# Patient Record
Sex: Female | Born: 1950 | Race: Black or African American | Hispanic: No | Marital: Married | Smoking: Never smoker
Health system: Southern US, Community
[De-identification: ages and names within clinical notes are randomized; demographics above are authoritative.]

## PROBLEM LIST (undated history)

## (undated) DIAGNOSIS — Z531 Procedure and treatment not carried out because of patient's decision for reasons of belief and group pressure: Secondary | ICD-10-CM

## (undated) DIAGNOSIS — R112 Nausea with vomiting, unspecified: Secondary | ICD-10-CM

## (undated) DIAGNOSIS — F329 Major depressive disorder, single episode, unspecified: Secondary | ICD-10-CM

## (undated) DIAGNOSIS — M3500 Sicca syndrome, unspecified: Secondary | ICD-10-CM

## (undated) DIAGNOSIS — J189 Pneumonia, unspecified organism: Secondary | ICD-10-CM

## (undated) DIAGNOSIS — I099 Rheumatic heart disease, unspecified: Secondary | ICD-10-CM

## (undated) DIAGNOSIS — S82852A Displaced trimalleolar fracture of left lower leg, initial encounter for closed fracture: Secondary | ICD-10-CM

## (undated) DIAGNOSIS — H409 Unspecified glaucoma: Secondary | ICD-10-CM

## (undated) DIAGNOSIS — N183 Chronic kidney disease, stage 3 unspecified: Secondary | ICD-10-CM

## (undated) DIAGNOSIS — M1990 Unspecified osteoarthritis, unspecified site: Secondary | ICD-10-CM

## (undated) DIAGNOSIS — Z8679 Personal history of other diseases of the circulatory system: Secondary | ICD-10-CM

## (undated) DIAGNOSIS — R911 Solitary pulmonary nodule: Secondary | ICD-10-CM

## (undated) DIAGNOSIS — K219 Gastro-esophageal reflux disease without esophagitis: Secondary | ICD-10-CM

## (undated) DIAGNOSIS — G4700 Insomnia, unspecified: Secondary | ICD-10-CM

## (undated) DIAGNOSIS — E785 Hyperlipidemia, unspecified: Secondary | ICD-10-CM

## (undated) DIAGNOSIS — IMO0001 Reserved for inherently not codable concepts without codable children: Secondary | ICD-10-CM

## (undated) DIAGNOSIS — Z86718 Personal history of other venous thrombosis and embolism: Secondary | ICD-10-CM

## (undated) DIAGNOSIS — M8580 Other specified disorders of bone density and structure, unspecified site: Secondary | ICD-10-CM

## (undated) DIAGNOSIS — Z86711 Personal history of pulmonary embolism: Secondary | ICD-10-CM

## (undated) DIAGNOSIS — Z87442 Personal history of urinary calculi: Secondary | ICD-10-CM

## (undated) DIAGNOSIS — I509 Heart failure, unspecified: Secondary | ICD-10-CM

## (undated) DIAGNOSIS — I10 Essential (primary) hypertension: Secondary | ICD-10-CM

## (undated) DIAGNOSIS — M5030 Other cervical disc degeneration, unspecified cervical region: Secondary | ICD-10-CM

## (undated) DIAGNOSIS — Z9889 Other specified postprocedural states: Secondary | ICD-10-CM

## (undated) DIAGNOSIS — S82892A Other fracture of left lower leg, initial encounter for closed fracture: Secondary | ICD-10-CM

## (undated) DIAGNOSIS — F32A Depression, unspecified: Secondary | ICD-10-CM

## (undated) DIAGNOSIS — H269 Unspecified cataract: Secondary | ICD-10-CM

## (undated) DIAGNOSIS — T7840XA Allergy, unspecified, initial encounter: Secondary | ICD-10-CM

## (undated) DIAGNOSIS — M797 Fibromyalgia: Secondary | ICD-10-CM

## (undated) DIAGNOSIS — F419 Anxiety disorder, unspecified: Secondary | ICD-10-CM

## (undated) HISTORY — DX: Sjogren syndrome, unspecified: M35.00

## (undated) HISTORY — DX: Rheumatic heart disease, unspecified: I09.9

## (undated) HISTORY — DX: Chronic kidney disease, stage 3 (moderate): N18.3

## (undated) HISTORY — DX: Chronic kidney disease, stage 3 unspecified: N18.30

## (undated) HISTORY — DX: Heart failure, unspecified: I50.9

## (undated) HISTORY — PX: FRACTURE SURGERY: SHX138

## (undated) HISTORY — DX: Personal history of pulmonary embolism: Z86.711

## (undated) HISTORY — DX: Major depressive disorder, single episode, unspecified: F32.9

## (undated) HISTORY — DX: Other cervical disc degeneration, unspecified cervical region: M50.30

## (undated) HISTORY — DX: Insomnia, unspecified: G47.00

## (undated) HISTORY — DX: Unspecified cataract: H26.9

## (undated) HISTORY — DX: Allergy, unspecified, initial encounter: T78.40XA

## (undated) HISTORY — DX: Pneumonia, unspecified organism: J18.9

## (undated) HISTORY — PX: HERNIA REPAIR: SHX51

## (undated) HISTORY — DX: Fibromyalgia: M79.7

## (undated) HISTORY — DX: Hyperlipidemia, unspecified: E78.5

## (undated) HISTORY — DX: Essential (primary) hypertension: I10

## (undated) HISTORY — DX: Personal history of urinary calculi: Z87.442

## (undated) HISTORY — DX: Gastro-esophageal reflux disease without esophagitis: K21.9

## (undated) HISTORY — DX: Anxiety disorder, unspecified: F41.9

## (undated) HISTORY — DX: Unspecified osteoarthritis, unspecified site: M19.90

## (undated) HISTORY — DX: Personal history of other diseases of the circulatory system: Z86.79

## (undated) HISTORY — DX: Other specified disorders of bone density and structure, unspecified site: M85.80

## (undated) HISTORY — DX: Unspecified glaucoma: H40.9

## (undated) HISTORY — PX: EYE SURGERY: SHX253

## (undated) HISTORY — DX: Personal history of other venous thrombosis and embolism: Z86.718

## (undated) HISTORY — DX: Solitary pulmonary nodule: R91.1

## (undated) HISTORY — DX: Depression, unspecified: F32.A

---
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
## 1978-03-18 DIAGNOSIS — Z9889 Other specified postprocedural states: Secondary | ICD-10-CM | POA: Insufficient documentation

## 1978-03-18 DIAGNOSIS — I099 Rheumatic heart disease, unspecified: Secondary | ICD-10-CM

## 1978-03-18 HISTORY — PX: TUBAL LIGATION: SHX77

## 1978-03-18 HISTORY — DX: Rheumatic heart disease, unspecified: I09.9

## 1978-03-18 HISTORY — PX: MITRAL VALVE REPAIR: SHX2039

## 1990-03-18 HISTORY — PX: VAGINAL HYSTERECTOMY: SUR661

## 1999-10-02 ENCOUNTER — Encounter: Payer: Self-pay | Admitting: Cardiology

## 2000-05-09 ENCOUNTER — Encounter: Payer: Self-pay | Admitting: Cardiology

## 2000-11-13 ENCOUNTER — Encounter: Payer: Self-pay | Admitting: Cardiology

## 2001-02-02 ENCOUNTER — Other Ambulatory Visit: Admission: RE | Admit: 2001-02-02 | Discharge: 2001-02-02 | Payer: Self-pay | Admitting: Obstetrics and Gynecology

## 2001-10-28 ENCOUNTER — Encounter: Payer: Self-pay | Admitting: Cardiology

## 2001-12-01 ENCOUNTER — Encounter: Payer: Self-pay | Admitting: Cardiology

## 2001-12-11 ENCOUNTER — Encounter: Payer: Self-pay | Admitting: Cardiology

## 2002-11-23 ENCOUNTER — Encounter: Payer: Self-pay | Admitting: Cardiology

## 2003-11-22 ENCOUNTER — Encounter: Admission: RE | Admit: 2003-11-22 | Discharge: 2003-11-22 | Payer: Self-pay | Admitting: Internal Medicine

## 2003-12-06 ENCOUNTER — Encounter: Payer: Self-pay | Admitting: Cardiology

## 2003-12-28 ENCOUNTER — Encounter: Payer: Self-pay | Admitting: Cardiology

## 2004-03-18 HISTORY — PX: BREAST BIOPSY: SHX20

## 2004-10-24 ENCOUNTER — Ambulatory Visit: Payer: Self-pay | Admitting: Internal Medicine

## 2004-10-26 ENCOUNTER — Encounter (INDEPENDENT_AMBULATORY_CARE_PROVIDER_SITE_OTHER): Payer: Self-pay | Admitting: Pulmonary Disease

## 2004-10-30 ENCOUNTER — Ambulatory Visit: Payer: Self-pay | Admitting: Internal Medicine

## 2004-11-01 ENCOUNTER — Ambulatory Visit: Payer: Self-pay | Admitting: Internal Medicine

## 2004-11-14 ENCOUNTER — Ambulatory Visit: Payer: Self-pay | Admitting: Internal Medicine

## 2004-12-04 ENCOUNTER — Ambulatory Visit: Payer: Self-pay | Admitting: Internal Medicine

## 2004-12-05 ENCOUNTER — Ambulatory Visit: Payer: Self-pay | Admitting: Cardiology

## 2004-12-05 ENCOUNTER — Encounter: Payer: Self-pay | Admitting: Cardiology

## 2004-12-05 ENCOUNTER — Ambulatory Visit (HOSPITAL_COMMUNITY): Admission: RE | Admit: 2004-12-05 | Discharge: 2004-12-05 | Payer: Self-pay | Admitting: Internal Medicine

## 2004-12-11 ENCOUNTER — Ambulatory Visit: Payer: Self-pay | Admitting: Internal Medicine

## 2004-12-18 ENCOUNTER — Ambulatory Visit: Payer: Self-pay | Admitting: Hospitalist

## 2004-12-25 ENCOUNTER — Ambulatory Visit: Payer: Self-pay | Admitting: Hospitalist

## 2005-01-08 ENCOUNTER — Ambulatory Visit: Payer: Self-pay | Admitting: Internal Medicine

## 2005-02-21 ENCOUNTER — Ambulatory Visit: Payer: Self-pay | Admitting: Internal Medicine

## 2005-07-02 ENCOUNTER — Encounter: Admission: RE | Admit: 2005-07-02 | Discharge: 2005-07-02 | Payer: Self-pay | Admitting: Internal Medicine

## 2005-07-02 ENCOUNTER — Encounter (INDEPENDENT_AMBULATORY_CARE_PROVIDER_SITE_OTHER): Payer: Self-pay | Admitting: Pulmonary Disease

## 2005-07-03 ENCOUNTER — Encounter: Admission: RE | Admit: 2005-07-03 | Discharge: 2005-07-03 | Payer: Self-pay | Admitting: Internal Medicine

## 2005-07-03 ENCOUNTER — Encounter (INDEPENDENT_AMBULATORY_CARE_PROVIDER_SITE_OTHER): Payer: Self-pay | Admitting: Pulmonary Disease

## 2005-07-03 ENCOUNTER — Encounter (INDEPENDENT_AMBULATORY_CARE_PROVIDER_SITE_OTHER): Payer: Self-pay | Admitting: Specialist

## 2005-08-26 ENCOUNTER — Ambulatory Visit: Payer: Self-pay | Admitting: Internal Medicine

## 2005-10-03 ENCOUNTER — Ambulatory Visit: Payer: Self-pay | Admitting: Internal Medicine

## 2005-12-31 DIAGNOSIS — I10 Essential (primary) hypertension: Secondary | ICD-10-CM | POA: Insufficient documentation

## 2005-12-31 DIAGNOSIS — E7800 Pure hypercholesterolemia, unspecified: Secondary | ICD-10-CM

## 2005-12-31 DIAGNOSIS — F5104 Psychophysiologic insomnia: Secondary | ICD-10-CM | POA: Insufficient documentation

## 2005-12-31 DIAGNOSIS — I099 Rheumatic heart disease, unspecified: Secondary | ICD-10-CM | POA: Insufficient documentation

## 2005-12-31 DIAGNOSIS — F339 Major depressive disorder, recurrent, unspecified: Secondary | ICD-10-CM | POA: Insufficient documentation

## 2006-01-07 ENCOUNTER — Ambulatory Visit: Payer: Self-pay | Admitting: Internal Medicine

## 2006-01-28 ENCOUNTER — Ambulatory Visit: Payer: Self-pay | Admitting: Internal Medicine

## 2006-02-11 ENCOUNTER — Encounter (INDEPENDENT_AMBULATORY_CARE_PROVIDER_SITE_OTHER): Payer: Self-pay | Admitting: Cardiology

## 2006-02-11 ENCOUNTER — Encounter: Payer: Self-pay | Admitting: Cardiology

## 2006-02-11 ENCOUNTER — Ambulatory Visit (HOSPITAL_COMMUNITY): Admission: RE | Admit: 2006-02-11 | Discharge: 2006-02-11 | Payer: Self-pay | Admitting: Internal Medicine

## 2006-03-28 DIAGNOSIS — Z9079 Acquired absence of other genital organ(s): Secondary | ICD-10-CM

## 2006-03-28 DIAGNOSIS — F411 Generalized anxiety disorder: Secondary | ICD-10-CM | POA: Insufficient documentation

## 2006-03-28 DIAGNOSIS — Z90710 Acquired absence of both cervix and uterus: Secondary | ICD-10-CM | POA: Insufficient documentation

## 2006-03-28 DIAGNOSIS — Z9889 Other specified postprocedural states: Secondary | ICD-10-CM

## 2006-08-14 ENCOUNTER — Emergency Department (HOSPITAL_COMMUNITY): Admission: EM | Admit: 2006-08-14 | Discharge: 2006-08-14 | Payer: Self-pay | Admitting: Emergency Medicine

## 2006-08-25 ENCOUNTER — Ambulatory Visit: Payer: Self-pay | Admitting: Internal Medicine

## 2006-08-25 ENCOUNTER — Encounter (INDEPENDENT_AMBULATORY_CARE_PROVIDER_SITE_OTHER): Payer: Self-pay | Admitting: Dermatology

## 2006-08-25 DIAGNOSIS — M549 Dorsalgia, unspecified: Secondary | ICD-10-CM | POA: Insufficient documentation

## 2006-08-27 ENCOUNTER — Encounter (INDEPENDENT_AMBULATORY_CARE_PROVIDER_SITE_OTHER): Payer: Self-pay | Admitting: Dermatology

## 2006-08-27 LAB — CONVERTED CEMR LAB
AST: 24 units/L (ref 0–37)
Albumin: 4.6 g/dL (ref 3.5–5.2)
Alkaline Phosphatase: 58 units/L (ref 39–117)
BUN: 12 mg/dL (ref 6–23)
Creatinine, Ser: 0.88 mg/dL (ref 0.40–1.20)
HDL: 70 mg/dL (ref >39)
Hepatitis B Surface Ag: NEGATIVE
LDL Cholesterol: 129 mg/dL — ABNORMAL HIGH (ref 0–99)
Potassium: 4.1 meq/L (ref 3.5–5.3)
Total Bilirubin: 0.3 mg/dL (ref 0.3–1.2)
Total CHOL/HDL Ratio: 3.1
Triglycerides: 96 mg/dL (ref <150)
VLDL: 19 mg/dL (ref 0–40)

## 2006-09-08 ENCOUNTER — Ambulatory Visit: Payer: Self-pay | Admitting: Internal Medicine

## 2006-09-08 ENCOUNTER — Encounter (INDEPENDENT_AMBULATORY_CARE_PROVIDER_SITE_OTHER): Payer: Self-pay | Admitting: Dermatology

## 2006-11-26 ENCOUNTER — Ambulatory Visit (HOSPITAL_COMMUNITY): Admission: RE | Admit: 2006-11-26 | Discharge: 2006-11-26 | Payer: Self-pay | Admitting: Internal Medicine

## 2006-11-26 ENCOUNTER — Ambulatory Visit: Payer: Self-pay | Admitting: Internal Medicine

## 2006-11-26 ENCOUNTER — Encounter (INDEPENDENT_AMBULATORY_CARE_PROVIDER_SITE_OTHER): Payer: Self-pay | Admitting: Internal Medicine

## 2006-12-09 ENCOUNTER — Encounter (INDEPENDENT_AMBULATORY_CARE_PROVIDER_SITE_OTHER): Payer: Self-pay | Admitting: Internal Medicine

## 2006-12-09 ENCOUNTER — Ambulatory Visit (HOSPITAL_COMMUNITY): Admission: RE | Admit: 2006-12-09 | Discharge: 2006-12-09 | Payer: Self-pay | Admitting: Internal Medicine

## 2006-12-09 ENCOUNTER — Encounter: Payer: Self-pay | Admitting: Cardiology

## 2006-12-09 ENCOUNTER — Ambulatory Visit: Payer: Self-pay | Admitting: Internal Medicine

## 2006-12-09 LAB — CONVERTED CEMR LAB
Calcium: 9.5 mg/dL (ref 8.4–10.5)
Cholesterol: 222 mg/dL — ABNORMAL HIGH (ref 0–200)
HDL: 74 mg/dL (ref >39)
Potassium: 4 meq/L (ref 3.5–5.3)
Total CHOL/HDL Ratio: 3
Triglycerides: 83 mg/dL (ref <150)

## 2006-12-25 ENCOUNTER — Ambulatory Visit: Payer: Self-pay | Admitting: *Deleted

## 2006-12-25 ENCOUNTER — Encounter (INDEPENDENT_AMBULATORY_CARE_PROVIDER_SITE_OTHER): Payer: Self-pay | Admitting: Infectious Diseases

## 2006-12-25 LAB — CONVERTED CEMR LAB
BUN: 10 mg/dL (ref 6–23)
Calcium: 9.4 mg/dL (ref 8.4–10.5)
Glucose, Bld: 122 mg/dL — ABNORMAL HIGH (ref 70–99)
Potassium: 4 meq/L (ref 3.5–5.3)

## 2006-12-29 ENCOUNTER — Telehealth: Payer: Self-pay | Admitting: *Deleted

## 2007-07-01 ENCOUNTER — Ambulatory Visit: Payer: Self-pay | Admitting: Internal Medicine

## 2007-07-01 ENCOUNTER — Ambulatory Visit (HOSPITAL_COMMUNITY): Admission: RE | Admit: 2007-07-01 | Discharge: 2007-07-01 | Payer: Self-pay | Admitting: Internal Medicine

## 2007-07-01 ENCOUNTER — Encounter (INDEPENDENT_AMBULATORY_CARE_PROVIDER_SITE_OTHER): Payer: Self-pay | Admitting: Internal Medicine

## 2007-07-01 DIAGNOSIS — M2550 Pain in unspecified joint: Secondary | ICD-10-CM | POA: Insufficient documentation

## 2007-07-08 ENCOUNTER — Encounter: Payer: Self-pay | Admitting: *Deleted

## 2007-07-27 LAB — CONVERTED CEMR LAB
ANA Titer 1: 1:40 {titer} — ABNORMAL HIGH
Alkaline Phosphatase: 81 units/L (ref 39–117)
Basophils Relative: 0 % (ref 0–1)
Bilirubin, Direct: <0.1 mg/dL (ref 0.0–0.3)
CO2: 20 meq/L (ref 19–32)
Calcium: 9.8 mg/dL (ref 8.4–10.5)
Eosinophils Absolute: 0.1 10*3/uL (ref 0.0–0.7)
Lymphs Abs: 2.1 10*3/uL (ref 0.7–4.0)
MCV: 82.6 fL (ref 78.0–100.0)
Neutrophils Relative %: 52 % (ref 43–77)
Platelets: 359 10*3/uL (ref 150–400)
Sed Rate: 30 mm/hr — ABNORMAL HIGH (ref 0–22)
Sodium: 141 meq/L (ref 135–145)
Total Bilirubin: 0.3 mg/dL (ref 0.3–1.2)
WBC: 5.4 10*3/uL (ref 4.0–10.5)

## 2007-07-28 ENCOUNTER — Ambulatory Visit: Payer: Self-pay | Admitting: Internal Medicine

## 2007-07-28 ENCOUNTER — Encounter: Payer: Self-pay | Admitting: Internal Medicine

## 2007-07-28 LAB — CONVERTED CEMR LAB
CRP: 1.1 mg/dL — ABNORMAL HIGH (ref <0.6)
Total CK: 151 units/L (ref 7–177)
ds DNA Ab: <1 (ref <5)

## 2007-09-01 ENCOUNTER — Ambulatory Visit: Payer: Self-pay | Admitting: Internal Medicine

## 2007-10-23 ENCOUNTER — Encounter (INDEPENDENT_AMBULATORY_CARE_PROVIDER_SITE_OTHER): Payer: Self-pay | Admitting: Internal Medicine

## 2007-11-14 ENCOUNTER — Emergency Department (HOSPITAL_COMMUNITY): Admission: EM | Admit: 2007-11-14 | Discharge: 2007-11-14 | Payer: Self-pay | Admitting: Emergency Medicine

## 2007-12-04 ENCOUNTER — Ambulatory Visit: Payer: Self-pay | Admitting: Internal Medicine

## 2007-12-04 ENCOUNTER — Encounter (INDEPENDENT_AMBULATORY_CARE_PROVIDER_SITE_OTHER): Payer: Self-pay | Admitting: Internal Medicine

## 2007-12-04 LAB — CONVERTED CEMR LAB
BUN: 10 mg/dL (ref 6–23)
Creatinine, Ser: 1.1 mg/dL (ref 0.40–1.20)

## 2008-02-07 ENCOUNTER — Emergency Department (HOSPITAL_COMMUNITY): Admission: EM | Admit: 2008-02-07 | Discharge: 2008-02-07 | Payer: Self-pay | Admitting: Emergency Medicine

## 2008-02-16 ENCOUNTER — Encounter (INDEPENDENT_AMBULATORY_CARE_PROVIDER_SITE_OTHER): Payer: Self-pay | Admitting: Internal Medicine

## 2008-02-16 ENCOUNTER — Ambulatory Visit: Payer: Self-pay | Admitting: Internal Medicine

## 2008-02-16 LAB — CONVERTED CEMR LAB
Calcium: 10 mg/dL (ref 8.4–10.5)
Potassium: 4.3 meq/L (ref 3.5–5.3)
Sodium: 140 meq/L (ref 135–145)

## 2008-03-01 ENCOUNTER — Ambulatory Visit: Payer: Self-pay | Admitting: Internal Medicine

## 2008-03-18 DIAGNOSIS — Z87442 Personal history of urinary calculi: Secondary | ICD-10-CM

## 2008-03-18 HISTORY — DX: Personal history of urinary calculi: Z87.442

## 2008-03-23 ENCOUNTER — Encounter (INDEPENDENT_AMBULATORY_CARE_PROVIDER_SITE_OTHER): Payer: Self-pay | Admitting: Internal Medicine

## 2008-03-23 ENCOUNTER — Ambulatory Visit: Payer: Self-pay | Admitting: Internal Medicine

## 2008-03-23 LAB — CONVERTED CEMR LAB
CO2: 21 meq/L (ref 19–32)
Cholesterol: 267 mg/dL — ABNORMAL HIGH (ref 0–200)
Glucose, Bld: 93 mg/dL (ref 70–99)
HDL: 65 mg/dL (ref >39)
Potassium: 3.7 meq/L (ref 3.5–5.3)
Sodium: 141 meq/L (ref 135–145)
Total CHOL/HDL Ratio: 4.1
VLDL: 25 mg/dL (ref 0–40)

## 2008-03-31 ENCOUNTER — Telehealth (INDEPENDENT_AMBULATORY_CARE_PROVIDER_SITE_OTHER): Payer: Self-pay | Admitting: Internal Medicine

## 2008-04-06 ENCOUNTER — Encounter (INDEPENDENT_AMBULATORY_CARE_PROVIDER_SITE_OTHER): Payer: Self-pay | Admitting: Internal Medicine

## 2008-04-07 ENCOUNTER — Ambulatory Visit: Payer: Self-pay | Admitting: Internal Medicine

## 2008-04-07 ENCOUNTER — Encounter (INDEPENDENT_AMBULATORY_CARE_PROVIDER_SITE_OTHER): Payer: Self-pay | Admitting: Internal Medicine

## 2008-04-15 ENCOUNTER — Ambulatory Visit (HOSPITAL_COMMUNITY): Admission: RE | Admit: 2008-04-15 | Discharge: 2008-04-15 | Payer: Self-pay | Admitting: Internal Medicine

## 2008-04-27 ENCOUNTER — Ambulatory Visit: Payer: Self-pay | Admitting: Internal Medicine

## 2008-04-27 LAB — CONVERTED CEMR LAB
OCCULT 1: NEGATIVE
OCCULT 2: NEGATIVE

## 2008-06-28 ENCOUNTER — Ambulatory Visit: Payer: Self-pay | Admitting: Internal Medicine

## 2008-06-28 DIAGNOSIS — N951 Menopausal and female climacteric states: Secondary | ICD-10-CM

## 2008-06-28 DIAGNOSIS — M7061 Trochanteric bursitis, right hip: Secondary | ICD-10-CM

## 2008-08-03 ENCOUNTER — Emergency Department (HOSPITAL_COMMUNITY): Admission: EM | Admit: 2008-08-03 | Discharge: 2008-08-03 | Payer: Self-pay | Admitting: Emergency Medicine

## 2008-08-29 ENCOUNTER — Encounter (INDEPENDENT_AMBULATORY_CARE_PROVIDER_SITE_OTHER): Payer: Self-pay | Admitting: Internal Medicine

## 2008-09-05 ENCOUNTER — Encounter (INDEPENDENT_AMBULATORY_CARE_PROVIDER_SITE_OTHER): Payer: Self-pay | Admitting: Internal Medicine

## 2009-05-15 ENCOUNTER — Ambulatory Visit (HOSPITAL_COMMUNITY): Admission: RE | Admit: 2009-05-15 | Discharge: 2009-05-15 | Payer: Self-pay | Admitting: Internal Medicine

## 2009-07-19 ENCOUNTER — Encounter (INDEPENDENT_AMBULATORY_CARE_PROVIDER_SITE_OTHER): Payer: Self-pay | Admitting: Internal Medicine

## 2010-04-04 ENCOUNTER — Emergency Department (HOSPITAL_COMMUNITY)
Admission: EM | Admit: 2010-04-04 | Discharge: 2010-04-04 | Payer: Self-pay | Source: Home / Self Care | Attending: Internal Medicine | Admitting: Internal Medicine

## 2010-04-04 ENCOUNTER — Encounter: Payer: Self-pay | Admitting: Ophthalmology

## 2010-04-04 ENCOUNTER — Observation Stay (HOSPITAL_COMMUNITY)
Admission: AD | Admit: 2010-04-04 | Discharge: 2010-04-06 | Payer: Self-pay | Source: Home / Self Care | Attending: Internal Medicine | Admitting: Internal Medicine

## 2010-04-09 LAB — POCT CARDIAC MARKERS
CKMB, poc: <1 ng/mL — ABNORMAL LOW (ref 1.0–8.0)
CKMB, poc: <1 ng/mL — ABNORMAL LOW (ref 1.0–8.0)
Myoglobin, poc: 68.8 ng/mL (ref 12–200)
Troponin i, poc: <0.05 ng/mL (ref 0.00–0.09)
Troponin i, poc: <0.05 ng/mL (ref 0.00–0.09)

## 2010-04-09 LAB — CBC
HCT: 38 % (ref 36.0–46.0)
Hemoglobin: 13.6 g/dL (ref 12.0–15.0)
MCH: 27.3 pg (ref 26.0–34.0)
MCH: 26.1 pg (ref 26.0–34.0)
MCHC: 34 g/dL (ref 30.0–36.0)
MCV: 79.8 fL (ref 78.0–100.0)
Platelets: 267 10*3/uL (ref 150–400)
Platelets: 273 10*3/uL (ref 150–400)
RBC: 4.98 MIL/uL (ref 3.87–5.11)
RBC: 4.76 MIL/uL (ref 3.87–5.11)
RDW: 14.5 % (ref 11.5–15.5)

## 2010-04-09 LAB — CARDIAC PANEL(CRET KIN+CKTOT+MB+TROPI)
CK, MB: 1 ng/mL (ref 0.3–4.0)
CK, MB: 1.6 ng/mL (ref 0.3–4.0)
Relative Index: 0.8 (ref 0.0–2.5)
Relative Index: 1 (ref 0.0–2.5)
Relative Index: 1 (ref 0.0–2.5)
Total CK: 133 U/L (ref 7–177)
Total CK: 166 U/L (ref 7–177)
Troponin I: <0.01 ng/mL (ref 0.00–0.06)

## 2010-04-09 LAB — MRSA PCR SCREENING: MRSA by PCR: NEGATIVE

## 2010-04-09 LAB — POCT I-STAT, CHEM 8
BUN: 8 mg/dL (ref 6–23)
Calcium, Ion: 1.15 mmol/L (ref 1.12–1.32)
Chloride: 107 mEq/L (ref 96–112)
Creatinine, Ser: 1.2 mg/dL (ref 0.4–1.2)
HCT: 43 % (ref 36.0–46.0)
Sodium: 141 mEq/L (ref 135–145)
TCO2: 23 mmol/L (ref 0–100)

## 2010-04-09 LAB — HEPATIC FUNCTION PANEL
Albumin: 4.3 g/dL (ref 3.5–5.2)
Bilirubin, Direct: <0.1 mg/dL (ref 0.0–0.3)
Total Protein: 8.5 g/dL — ABNORMAL HIGH (ref 6.0–8.3)

## 2010-04-09 LAB — LIPASE, BLOOD: Lipase: 19 U/L (ref 11–59)

## 2010-04-09 LAB — BASIC METABOLIC PANEL
BUN: 8 mg/dL (ref 6–23)
Chloride: 107 mEq/L (ref 96–112)
GFR calc Af Amer: >60 mL/min (ref >60)
Potassium: 3.2 mEq/L — ABNORMAL LOW (ref 3.5–5.1)

## 2010-04-09 LAB — COMPREHENSIVE METABOLIC PANEL
ALT: 13 U/L (ref 0–35)
AST: 22 U/L (ref 0–37)
Chloride: 111 mEq/L (ref 96–112)
Creatinine, Ser: 1 mg/dL (ref 0.4–1.2)
GFR calc Af Amer: >60 mL/min (ref >60)
GFR calc non Af Amer: 57 mL/min — ABNORMAL LOW (ref >60)
Glucose, Bld: 104 mg/dL — ABNORMAL HIGH (ref 70–99)
Potassium: 3.6 mEq/L (ref 3.5–5.1)
Sodium: 141 mEq/L (ref 135–145)
Total Bilirubin: 0.4 mg/dL (ref 0.3–1.2)

## 2010-04-09 LAB — URINALYSIS, MICROSCOPIC ONLY
Ketones, ur: NEGATIVE mg/dL
Protein, ur: NEGATIVE mg/dL
Urine Glucose, Fasting: NEGATIVE mg/dL
Urobilinogen, UA: 0.2 mg/dL (ref 0.0–1.0)
pH: 5.5 (ref 5.0–8.0)

## 2010-04-09 LAB — DIFFERENTIAL
Basophils Relative: 0 % (ref 0–1)
Eosinophils Absolute: 0 10*3/uL (ref 0.0–0.7)
Eosinophils Relative: 0 % (ref 0–5)
Lymphocytes Relative: 36 % (ref 12–46)
Lymphs Abs: 1.7 10*3/uL (ref 0.7–4.0)
Monocytes Absolute: 0.5 10*3/uL (ref 0.1–1.0)

## 2010-04-14 NOTE — Discharge Summary (Signed)
Alexandria Leach, Alexandria Leach NO.:  [id number]  MEDICAL RECORD NO.:  [id number]          PATIENT TYPE:  OBV  LOCATION:  2031                         FACILITY:  [facility]  PHYSICIAN:  Alexandria Stable, MD   DATE OF BIRTH:  1951-01-05  DATE OF ADMISSION:  04/04/2010 DATE OF DISCHARGE:  04/06/2010                              DISCHARGE SUMMARY   DISCHARGE DIAGNOSES: 1. Nausea and vomiting. 2. Question of fever. 3. Headache. 4. Hypokalemia. 5. T-wave inversion. 6. Depression. 7. Rheumatic heart disease, status post mitral valve repair. 8. Hypertension. 9. Hypercholesterolemia. 10.Insomnia. 11.Anxiety. 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  DISCHARGE MEDICATIONS: 1. Citalopram 40 mg tablets, take 1 tablet by mouth daily. 2. Protonix 40 mg tabs, take 1 tablet by mouth daily. 3. Amlodipine 10 mg tabs, take 1 tablet by mouth daily. 4. Aspirin 325 mg enteric-coated tabs, take 1 tablet by mouth daily. 5. Chlorthalidone 25 mg tabs, take one-half tablet by mouth every     morning. 6. Clonazepam 0.5 mg tabs, take 1 tablet by mouth twice daily. 7. Keppra 250 mg tabs, take 1 tablet by mouth twice daily. 8. Percocet 5/325 mg tabs, take 1 tablet by mouth twice daily as     needed. 9. Vitamin D3 1000 units 1 tablet by mouth every evening.  DISPOSITION AND FOLLOWUP:  The patient is scheduled to follow up with Dr. Irma Newness at the [facility] on April 30, 2010 at 9:10 a.m.  At that visit, the patient's hospital stay should be discussed and the patient's nausea and vomiting should be discussed to ensure that she has had resolution of her symptoms without any further recurrence or problems.  The patient was mildly hypokalemic on admission with a potassium of 3.20.  This corrected to 3.6 spontaneously.  As such, a BMET should be checked to ensure that the patient's electrolytes are all within normal limits.  PROCEDURES PERFORMED: 1. A two view chest x-ray  was performed on April 04, 2010, which     demonstrated no active disease. 2. One view abdominal x-ray was performed on April 04, 2010, which     demonstrated a gas distended colon with a sigmoid colon measuring     up to 6.9 cm.  CONSULTATIONS:  None.  BRIEF ADMITTING HISTORY AND PHYSICAL:  This is a 60 year old female with a history of mitral valve repair in 1980 secondary to rheumatic fever, severe depression and anxiety, as well as hypertension who presents as a transfer from [facility] for a 2-day history of nausea, vomiting, and headache.  The patient states that on Monday prior to her admission, she developed mild nausea and vomited once on the day prior to admission and once the day before that.  The vomit was nonbilious and nonbloody. The patient's symptoms have been associated with about 1 week of cold, chills, and subjective fever yesterday.  The patient also states that she feels that she may have a sinus infection, which she develops annually.  The patient denies any significant sinus drainage at this time.  The patient does have a mild headache associated with pressure. This is typical for her.  Currently, the patient denies any cough, abdominal pain, change in bowel movements, blood in her stool, black stool, or change in her weight.  The patient does endorse some slight shortness of breath, which she has had since last Friday, but has improved on admission.  In the ED, the patient had an EKG performed, which demonstrated T-wave inversions in lead V1 through V4.  Cardiology was counseled and the cardiac enzymes were negative without any ST changes.  ALLERGIES:  CODEINE, PLAQUENIL, EFFEXOR, PRAVASTATIN.  PAST MEDICAL HISTORY: 1. Depression. 2. Hypertension. 3. Rheumatic heart disease, status post mitral valve repair. 4. Hypercholesterolemia. 5. Insomnia. 6. Anxiety. 7. DVT. 8. Borderline diabetes. 9. Undifferentiated connective tissue disease.  PHYSICAL  EXAMINATION:  ADMISSION VITAL SIGNS:  Temperature 99.0, pulse 88, blood pressure 151/91, respiratory rate 16, satting 100% on room air. GENERAL:  Alert, well developed, and cooperative to examination. HEAD:  Normocephalic, atraumatic. EYES:  Vision grossly intact.  Pupils equal, round, and reactive to light. MOUTH:  Pharynx pink and moist.  No erythema or exudates. LUNGS:  Bilaterally clear to auscultation.  No crackles, wheezes, or rubs. HEART:  Regular rate and rhythm, 2/6 systolic murmur, no gallops or rubs. ABDOMEN:  Soft, nontender, normal bowel sounds.  No distention or guarding. VASCULAR:  Pulses 2+ DP/PT pulses bilaterally. EXTREMITIES:  No cyanosis, clubbing, or edema.  LABS ON ADMISSION:  Sodium 141, potassium 3.2, chloride 107, bicarb 23, BUN of 8, creatinine of 1.2, glucose of 106.  White blood cell count 4.9, hemoglobin 13.6, hematocrit 40.0, platelet count 267.  Lipase 19. Cardiac enzymes negative x2.  HOSPITAL COURSE BY PROBLEM: 1. Nausea and vomiting:  The differential diagnosis included viral     gastroenteritis, renal calculi, GERD, UTI, and constipation.  At     the time of admission, the patient did not have any costovertebral     angle tenderness and UA was negative for any blood or UTI.  The     patient was changed from Pepcid 20 mg to Protonix 40 mg daily.  A     KUB was performed to evaluate the patient's stool burden, which     only demonstrated a dilated colon up to 6 cm.  The most likely     cause of the patient's symptoms was viral gastroenteritis and the     patient was initially placed on a clear diet and started on 100     mL/hour of normal saline.  Over the following day, the patient's     diet was advanced as tolerated and on the day of discharge, she was     able to eat a full diet without any nausea or vomiting.  1. Fever:  The physician in the ED reported a fever of 101 though this     was not demonstrated in the medical record and appears to  have been     by report.  The patient admits to me at the time of admission that     she does not currently have a working thermometer, but did feel     subjective fevers.  The patient's temperature was monitored closely     while in the hospital, and she was found to be afebrile during her     stay.  UA was negative as mentioned previously, and chest x-ray was     normal.  It is unlikely that there is a significant bacterial     illness causing this fever and if she indeed had a fever  prior to     her admission, this is likely secondary to a viral etiology, which     is now improved.  1. Headache:  This is possibly secondary to sinusitis; however, the     patient denied any significant drainage and did not have any     tenderness to over her sinuses on physical exam.  As such, it was     not felt that antibiotics were warranted at this time.  It is more     likely that this headache represents a tension headache.  The     patient's headache improved after she started her diet and on the     day of discharge, she denied any further pain in her head.  This     should continue to be monitored on an outpatient basis.  1. Hypokalemia:  This is likely secondary to nausea and vomiting     versus medication effects and because of her chlorthalidone.  As     mentioned previously, the patient's potassium spontaneously     improved from 3.2-3.6 on the evening of the admission, we did     however replete her with 40 mEq of KCl.  At her followup visit, a     BMET should be rechecked and consideration of adjustment of her     chlorthalidone should be considered if she continues to be     hyperkalemic.  Potassium chloride can also be added if this     medication has continued to work well for her.  1. T-wave inversions:  Reports from [facility] stated that she had T-wave     abnormalities on her EKG; however, we were unable to view an EKG to     compare her current to those.  A repeat EKG in the morning  showed a     PVC as well as continued T-wave inversions in the anterior leads.     These were felt to likely second to be old in nature.  The     patient's cardiac enzymes were negative x3, and it is felt that     this is unlikely to represent any cardiac etiology as the patient     denied any chest pain during or prior to her hospitalization.  1. Depression:  This is Leach for now.  The patient denies any     suicidal or homicidal ideation.  We continued her Celexa 60 mg     daily initially, however, because there is increased risk for QT     prolongation with this dose of Celexa, 40 mg is now recommended as     a maximum dose in patients as such the patient was discharged on 40     mg of Celexa instead of 60.  The patient was notified of this     change and told to inform her primary care physician or go to the     ED should she have any changes in her mood or developed any     suicidal or homicidal ideation.  It is recommended that this be     followed up at her hospital followup to ensure that she has not     developed any negative consequences of this medication change.  DISCHARGE VITAL SIGNS:  On the day of discharge, the patient blood pressure was 123/77, temperature was 98.7, pulse 87, respirations 20, satting at 98% on room air.     Sinda Du, MD   ______________________________ Alexandria Stable,  MD    BB/MEDQ  D:  04/06/2010  T:  04/07/2010  Job:  [id number]  cc:   [facility]  Electronically Signed by Sinda Du MD on 04/12/2010 10:38:38 AM Electronically Signed by Alexandria Stable MD on 04/14/2010 03:36:58 PM

## 2010-04-17 NOTE — Miscellaneous (Signed)
Summary: [facility]  [facility]   Imported By: Margie Billet 08/16/2009 14:12:40  _____________________________________________________________________  External Attachment:    Type:   Image     Comment:   External Document

## 2010-04-19 NOTE — Miscellaneous (Signed)
Summary: Hospital Admission  INTERNAL MEDICINE ADMISSION HISTORY AND PHYSICAL Attending: Dr. Sandie Ano First Contact: Dr. Cathey Endow [phone] Second Contact: Dr. Sherryll Burger [phone] PCP: [facility] CC: Nausea/vomiting/headache HPI: This is a 60 year old female with a hx of mitral valve repair 1980 2/2 rheumatic fever, severe depression and anxiety as well as hypertension who presents as a transfer from [facility] ED with a 2 day hx of nausea/vomiting and headache.  Pt states that on Monday she developed mild nausea and then vomited once yesterday and once on the day of admission. The vomit was non-bilious and non-bloody.  The patients symptoms have been associated with about 1 weeks worth of cold chills and subjective fever yesterday.  Pt also states that she feels that she may have a sinus infection (which she develops annually) but denies any significant drainage.  Pt has a mild headache associated with the pressure but this is typical for her. Currently, pt denies any cough, abdominal pain, change in her BM's, blood in her stool, black stool, or change in her weight.  Pt does endorse some slight SOB which she has had since last Friday.   In the ED,  pt had a EKG performed which demonstrated t-wave inversions in V1-V4. Cardiology was consulted, but CE's were negative without an ST changes.   ALLERGIES:  ! CODEINE Plaquenil - Hearing loss Effexor - Heart Racing Pravastatin- Muscle cramps  PAST MEDICAL HISTORY: Depression Hypertension Rheumatic heart disease s/p mitral valve repair f/u by Dr. Jenne Campus Hypercholesterolemia Insomnia Anxiety DVT Multiple joint pains, evaluated by Dr. Corliss Skains Aug 2009: "mild RA", fibromyalgia Borderline DM  Undifferentiated Conective Tissue disease HLD  MEDICATIONS: ASA 325mg  once daily Enalapril 20mg  two times a day Citalopram 60mg  once daily Clonazepam 0.5mg  three times a day amlodipine 10 mg once daily percocet 5/325 mg as needed pepcid 20mg  two times a  day clorthalidone 25mg  once daily Levetiracetam 20 mg two times a day   SOCIAL HISTORY: Married, lives in [place], has 4 children, never smoked, denies any alcohol or elicit drug use.   FAMILY HISTORY Mother - Lung CA Father - CKD Maternal grandparents- strokes  ROS: Negative as per HPI.   VITALS: T:99.0  P:88  BP:151/91  R:16  O2SAT:100%  ON:RA PHYSICAL EXAM: General:  alert, well-developed, and cooperative to examination.   Head:  normocephalic and atraumatic.   Eyes:  vision grossly intact, pupils equal, pupils round, pupils reactive to light, no injection and anicteric.   Mouth:  pharynx pink and moist, no erythema, and no exudates.  Neck:  supple, full ROM, no thyromegaly, no JVD, and no carotid bruits.   Lungs:  normal respiratory effort, no accessory muscle use, normal breath sounds, no crackles, and no wheezes.  Heart:  normal rate, regular rhythm, 2/6 systolic murmur, no gallop, and no rub.   Abdomen:  soft, non-tender, normal bowel sounds, no distention, no guarding, no rebound tenderness, no hepatomegaly, and no splenomegaly.   Msk:  no joint swelling, no joint warmth, and no redness over joints.   Pulses:  2+ DP/PT pulses bilaterally Extremities:  No cyanosis, clubbing, edema  Neurologic:  alert & oriented X3, cranial nerves II-XII intact, strength normal in all extremities, sensation intact to light touch, and gait normal.   Skin:  turgor normal and no rashes.   Psych:  Oriented X3, memory intact for recent and remote, normally interactive, good eye contact, not anxious appearing, and not depressed  appearing.  LABS: TCO2  23                0-100            mmol/L  Ionized Calcium                          1.15              1.12-1.32        mmol/L  Hemoglobin (HGB)                         14.6              12.0-15.0        g/dL  Hematocrit (HCT)                         43.0              36.0-46.0        %  Sodium (NA)                               141               135-145          mEq/L  Potassium (K)                            3.2        l      3.5-5.1          mEq/L  Chloride                                 107               96-112           mEq/L  Glucose                                  106        h      70-99            mg/dL  BUN                                      8                 6-23             mg/dL  Creatinine                               1.2               0.4-1.2          mg/dL   WBC                                      4.9  4.0-10.5         K/uL  RBC                                      4.98              3.87-5.11        MIL/uL  Hemoglobin (HGB)                         13.6              12.0-15.0        g/dL  Hematocrit (HCT)                         40.0              36.0-46.0        %  MCV                                      80.3              78.0-100.0       fL  MCH -                                    27.3              26.0-34.0        pg  MCHC                                     34.0              30.0-36.0        g/dL  RDW                                      14.5              11.5-15.5        %  Platelet Count (PLT)                     267               150-400          K/uL  Neutrophils, %                           55                43-77            %  Lymphocytes, %                           36                12-46            %  Monocytes, %  9                 3-12             %  Eosinophils, %                           0                 0-5              %  Basophils, %                             0                 0-1              %  Neutrophils, Absolute                    2.7               1.7-7.7          K/uL  Lymphocytes, Absolute                    1.7               0.7-4.0          K/uL  Monocytes, Absolute                      0.5               0.1-1.0          K/uL  Eosinophils, Absolute                    0.0               0.0-0.7           K/uL  Basophils, Absolute                      0.0               0.0-0.1          K/uL  Bilirubin, Total                         0.6               0.3-1.2          mg/dL  Bilirubin, Direct                        <0.1              0.0-0.3          mg/dL  Indirect Bilirubin                       SEE NOTE.         0.3-0.9          mg/dL    NOT CALCULATED  Alkaline Phosphatase                     55                39-117  U/L  SGOT (AST)                               24                0-37             U/L  SGPT (ALT)                               14                0-35             U/L  Total  Protein                           8.5        h      6.0-8.3          g/dL  Albumin-Blood                            4.3               3.5-5.2          g/dL   Lipase                                   19                11-59            U/L    CKMB, POC                                <1.0       l      1.0-8.0          ng/mL  Troponin I, POC                          <0.05             0.00-0.09        ng/mL  Myoglobin, POC                           83.0              12-200           ng/mL  CKMB, POC                                <1.0       l      1.0-8.0          ng/mL  Troponin I, POC                          <0.05             0.00-0.09        ng/mL  Myoglobin, POC  68.8              12-200           ng/mL  IMAGING: Chest X-ray  IMPRESSION:   No active disease.   ASSESSMENT AND PLAN: This is a 60 year old female with a hx of mitral valve repair 1980 2/2 rheumatic fever, severe depression and anxiety who presents with a 2 day hx of nausea/vomiting and ?of fever.   -Nausea/vomiting: DDX would include viral gastroenteritis, renal calculi, GERD, UTI, constipation. At this point, pt does not have an acute abdomen and ABD is NT, but will check KUB to evaluate stool burden, will also check UA and urine micro which should help to R/O UTI (though pt currently denies any  dysuria) and decrease the likelihood of stones. Will start pt on protonix 40mg  daily in case this is GERD. Pt is currently on a clear diet and we will advance it as tolerated. Will start IVF at 100cc/h.   -Fever: ED physician reports fever to 101 though pt denies having a working thermometer.  Pt appears to have been afebrile in the ED today.  Chest x-ray was normal as was her pulmonary exam.  As mentioned above, will check a UA.  Sinusitis could be a possible source of fever, though pt does not have significant discharge.  Will continue to monitor fever curve at this time, but will not start any antibiotics.   -Headache: Possibly secondary to sinusitis vs tension headache.  Pts headache was improved at the time of examination so I will continue to monitor.  May consider CT scan of the sinuses if pt develops further symptoms.   -Hypokalemia: Likely secondary to her nausea/vomiting vs. medication effect from chlorthalidone.  Will replete with KCL x 2.   -T-wave inversions: Pt will have AM EKG for comparison.  Will cycle cardiac enzymes though ACS is unlikely at this time given that pt denies any chest pain.   -Depression: Stable now, denies SI, HI.  Will continue home meds.  -PPX: Lovenox.      Attending Physician: I have seen and examined the patient. I reviewed the resident/fellow note and agree with the findings and plan of care as documented. My additions and revisions are included.

## 2010-06-04 ENCOUNTER — Other Ambulatory Visit (HOSPITAL_COMMUNITY): Payer: Self-pay | Admitting: Rheumatology

## 2010-06-04 DIAGNOSIS — Z1231 Encounter for screening mammogram for malignant neoplasm of breast: Secondary | ICD-10-CM

## 2010-06-14 ENCOUNTER — Emergency Department (HOSPITAL_COMMUNITY)
Admission: EM | Admit: 2010-06-14 | Discharge: 2010-06-14 | Disposition: A | Discharge status: Home / Self Care | Payer: Medicare Other | Attending: Emergency Medicine | Admitting: Emergency Medicine

## 2010-06-14 DIAGNOSIS — R42 Dizziness and giddiness: Secondary | ICD-10-CM | POA: Insufficient documentation

## 2010-06-14 DIAGNOSIS — IMO0001 Reserved for inherently not codable concepts without codable children: Secondary | ICD-10-CM | POA: Insufficient documentation

## 2010-06-14 DIAGNOSIS — R5383 Other fatigue: Secondary | ICD-10-CM | POA: Insufficient documentation

## 2010-06-14 DIAGNOSIS — R55 Syncope and collapse: Secondary | ICD-10-CM | POA: Insufficient documentation

## 2010-06-14 DIAGNOSIS — M069 Rheumatoid arthritis, unspecified: Secondary | ICD-10-CM | POA: Insufficient documentation

## 2010-06-14 DIAGNOSIS — E7800 Pure hypercholesterolemia, unspecified: Secondary | ICD-10-CM | POA: Insufficient documentation

## 2010-06-14 DIAGNOSIS — Z79899 Other long term (current) drug therapy: Secondary | ICD-10-CM | POA: Insufficient documentation

## 2010-06-14 DIAGNOSIS — R5381 Other malaise: Secondary | ICD-10-CM | POA: Insufficient documentation

## 2010-06-14 DIAGNOSIS — Z9889 Other specified postprocedural states: Secondary | ICD-10-CM | POA: Insufficient documentation

## 2010-06-14 DIAGNOSIS — I10 Essential (primary) hypertension: Secondary | ICD-10-CM | POA: Insufficient documentation

## 2010-06-14 LAB — CBC
HCT: 40.8 % (ref 36.0–46.0)
MCH: 27.5 pg (ref 26.0–34.0)
MCHC: 33.3 g/dL (ref 30.0–36.0)
MCV: 82.4 fL (ref 78.0–100.0)
Platelets: 251 10*3/uL (ref 150–400)
RDW: 15.1 % (ref 11.5–15.5)
WBC: 6.7 10*3/uL (ref 4.0–10.5)

## 2010-06-14 LAB — POCT I-STAT, CHEM 8
Calcium, Ion: 1.12 mmol/L (ref 1.12–1.32)
Chloride: 108 mEq/L (ref 96–112)
Creatinine, Ser: 1.1 mg/dL (ref 0.4–1.2)
Glucose, Bld: 92 mg/dL (ref 70–99)
HCT: 43 % (ref 36.0–46.0)

## 2010-06-14 LAB — DIFFERENTIAL
Eosinophils Absolute: 0.2 10*3/uL (ref 0.0–0.7)
Eosinophils Relative: 3 % (ref 0–5)
Lymphs Abs: 1.9 10*3/uL (ref 0.7–4.0)
Monocytes Absolute: 0.6 10*3/uL (ref 0.1–1.0)
Monocytes Relative: 9 % (ref 3–12)

## 2010-06-26 LAB — BASIC METABOLIC PANEL WITH GFR
BUN: 12 mg/dL (ref 6–23)
Chloride: 103 meq/L (ref 96–112)
Glucose, Bld: 96 mg/dL (ref 70–99)
Potassium: 3.5 meq/L (ref 3.5–5.1)

## 2010-06-26 LAB — URINALYSIS, ROUTINE W REFLEX MICROSCOPIC
Bilirubin Urine: NEGATIVE
Ketones, ur: NEGATIVE mg/dL
Nitrite: NEGATIVE
Protein, ur: 30 mg/dL — AB
Urobilinogen, UA: 1 mg/dL (ref 0.0–1.0)

## 2010-06-26 LAB — BASIC METABOLIC PANEL
CO2: 25 mEq/L (ref 19–32)
Calcium: 9.4 mg/dL (ref 8.4–10.5)
Creatinine, Ser: 0.95 mg/dL (ref 0.4–1.2)
GFR calc Af Amer: >60 mL/min (ref >60)
GFR calc non Af Amer: >60 mL/min (ref >60)
Sodium: 134 mEq/L — ABNORMAL LOW (ref 135–145)

## 2010-06-26 LAB — URINE CULTURE: Colony Count: 30000

## 2010-07-02 ENCOUNTER — Ambulatory Visit: Payer: Medicare Other

## 2010-07-18 ENCOUNTER — Ambulatory Visit (HOSPITAL_COMMUNITY)
Admission: RE | Admit: 2010-07-18 | Discharge: 2010-07-18 | Disposition: A | Discharge status: Home / Self Care | Payer: Medicare Other | Source: Ambulatory Visit | Attending: Rheumatology | Admitting: Rheumatology

## 2010-07-18 DIAGNOSIS — Z1231 Encounter for screening mammogram for malignant neoplasm of breast: Secondary | ICD-10-CM | POA: Insufficient documentation

## 2010-08-06 ENCOUNTER — Encounter: Payer: Self-pay | Admitting: Cardiology

## 2010-08-10 ENCOUNTER — Ambulatory Visit (INDEPENDENT_AMBULATORY_CARE_PROVIDER_SITE_OTHER): Payer: Medicare Other | Admitting: Cardiology

## 2010-08-10 ENCOUNTER — Encounter: Payer: Self-pay | Admitting: Cardiology

## 2010-08-10 VITALS — BP 135/89 | HR 91 | Ht 62.0 in | Wt 173.0 lb

## 2010-08-10 DIAGNOSIS — R002 Palpitations: Secondary | ICD-10-CM | POA: Insufficient documentation

## 2010-08-10 DIAGNOSIS — R0609 Other forms of dyspnea: Secondary | ICD-10-CM

## 2010-08-10 DIAGNOSIS — Z9889 Other specified postprocedural states: Secondary | ICD-10-CM

## 2010-08-10 DIAGNOSIS — I059 Rheumatic mitral valve disease, unspecified: Secondary | ICD-10-CM

## 2010-08-10 DIAGNOSIS — R0602 Shortness of breath: Secondary | ICD-10-CM

## 2010-08-10 DIAGNOSIS — R0600 Dyspnea, unspecified: Secondary | ICD-10-CM

## 2010-08-10 DIAGNOSIS — E7800 Pure hypercholesterolemia, unspecified: Secondary | ICD-10-CM

## 2010-08-10 DIAGNOSIS — I10 Essential (primary) hypertension: Secondary | ICD-10-CM

## 2010-08-10 MED ORDER — METOPROLOL SUCCINATE ER 25 MG PO TB24: 25.0000 mg | ORAL_TABLET | Freq: Every day | ORAL | Status: DC

## 2010-08-10 NOTE — Assessment & Plan Note (Signed)
Continued SBE prophylaxis. Plan echocardiogram.

## 2010-08-10 NOTE — Progress Notes (Signed)
HPI: 60 year old female with past medical history of mitral valve repair secondary to rheumatic heart disease for establishment. Patient is status post mitral valve repair in 1980. Operative report not available. Last echocardiogram that I have access to was performed in 2008. This showed normal LV function, mild LAE, mild MS with mean gradient 9 mmHg. Mitral valve area by pressure half-time was 2.04 cm^2; mild MR. Patient states that she does have dyspnea on exertion relieved with rest. There is no orthopnea, PND, pedal edema, syncope or exertional chest pain. She also has palpitations. They are described as a brief flutter. They last one to 2 seconds and resolve spontaneously. There are no associated symptoms.  Current Outpatient Prescriptions  Medication Sig Dispense Refill  . amLODipine (NORVASC) 5 MG tablet Take 5 mg by mouth daily.        Marland Kitchen aspirin 325 MG EC tablet Take 325 mg by mouth daily.        . cholecalciferol (VITAMIN D) 1000 UNITS tablet Take 1,000 Units by mouth daily.        . clonazePAM (KLONOPIN) 1 MG tablet Take 1 mg by mouth 2 (two) times daily as needed.        . enalapril (VASOTEC) 20 MG tablet Take 20 mg by mouth daily.        . famotidine (PEPCID) 20 MG tablet Take 20 mg by mouth 2 (two) times daily.        . Nutritional Supplements (ESTROVEN PO) Take by mouth at bedtime.        Marland Kitchen DISCONTD: cyclobenzaprine (FLEXERIL) 10 MG tablet Take 10 mg by mouth 3 (three) times daily as needed.        Marland Kitchen DISCONTD: furosemide (LASIX) 40 MG tablet Take 40 mg by mouth daily.        Marland Kitchen DISCONTD: lisinopril (PRINIVIL,ZESTRIL) 20 MG tablet Take 20 mg by mouth daily.        Marland Kitchen DISCONTD: LORazepam (ATIVAN) 0.5 MG tablet Take 0.5 mg by mouth daily as needed.        Marland Kitchen DISCONTD: lovastatin (MEVACOR) 40 MG tablet Take 40 mg by mouth at bedtime.        Marland Kitchen DISCONTD: methocarbamol (ROBAXIN) 500 MG tablet Take 500 mg by mouth 2 (two) times daily.        Marland Kitchen DISCONTD: naproxen (NAPROSYN) 250 MG tablet Take 250  mg by mouth 2 (two) times daily as needed.        Marland Kitchen DISCONTD: PARoxetine (PAXIL-CR) 25 MG 24 hr tablet Take 25 mg by mouth every morning.        Marland Kitchen DISCONTD: traMADol (ULTRAM) 50 MG tablet Take 50 mg by mouth every 8 (eight) hours as needed.          Allergies  Allergen Reactions  . Codeine     REACTION: Itching    Past Medical History  Diagnosis Date  . Depression   . HTN (hypertension)   . Rheumatic heart disease     s/p mitral valve repair 1980  . Hypercholesteremia   . Insomnia   . Anxiety   . DVT (deep venous thrombosis)   . Pain, joint, multiple sites   . Fibromyalgia   . Connective tissue disease     Past Surgical History  Procedure Date  . Mitral valve repair   . Vaginal hysterectomy     for fibroids -- partial    History   Social History  . Marital Status: Married    Spouse Name: N/A  Number of Children: 4  . Years of Education: N/A   Occupational History  .      Disability   Social History Main Topics  . Smoking status: Never Smoker   . Smokeless tobacco: Not on file  . Alcohol Use: 0.6 oz/week    1 Cans of beer per week     occasional  . Drug Use: No  . Sexually Active: Not on file   Other Topics Concern  . Not on file   Social History Narrative  . No narrative on file    Family History  Problem Relation Age of Onset  . Lupus    . Kidney failure    . Bone cancer    . Heart disease Mother     MI in her 15s    ROS: Problems with arthralgias but no fevers or chills, productive cough, hemoptysis, dysphasia, odynophagia, melena, hematochezia, dysuria, hematuria, rash, seizure activity, orthopnea, PND, pedal edema, claudication. Remaining systems are negative.  Physical Exam: General:  Well developed/well nourished in NAD Skin warm/dry Patient not depressed No peripheral clubbing Back-normal HEENT-normal/normal eyelids Neck supple/normal carotid upstroke bilaterally; no bruits; no JVD; no thyromegaly chest - CTA/ normal  expansion CV - RRR/normal S1 and S2; no  rubs or gallops;  PMI nondisplaced; 2/6 systolic murmur at the apex. Abdomen -NT/ND, no HSM, no mass, + bowel sounds, no bruit 2+ femoral pulses, no bruits Ext-no edema, chords, 2+ DP Neuro-grossly nonfocal  ECG Sinus rhythm at a rate of 92. No ST-T changes

## 2010-08-10 NOTE — Assessment & Plan Note (Signed)
Blood pressure elevated. Add Toprol 25 mg daily both for blood pressure and palpitations.

## 2010-08-10 NOTE — Assessment & Plan Note (Signed)
Management per primary care. 

## 2010-08-10 NOTE — Assessment & Plan Note (Signed)
Status post mitral valve repair. However patient euvolemic on examination. Scheduled to assess LV function and to rule out significant mitral stenosis/mitral regurgitation.

## 2010-08-10 NOTE — Patient Instructions (Signed)
Your physician recommends that you schedule a follow-up appointment in: 6 months. The office will mail you a reminder letter 2 months prior appointment date. Your physician has requested that you have an echocardiogram. Echocardiography is a painless test that uses sound waves to create images of your heart. It provides your doctor with information about the size and shape of your heart and how well your heart's chambers and valves are working. This procedure takes approximately one hour. There are no restrictions for this procedure.  Your physician has recommended you make the following change in your medication:  Start Toprol 25 mg once daily.

## 2010-08-10 NOTE — Assessment & Plan Note (Signed)
Add Toprol 25 mg daily. If symptoms persist plan cardionet.

## 2010-08-14 ENCOUNTER — Telehealth: Payer: Self-pay | Admitting: Cardiology

## 2010-08-14 DIAGNOSIS — Z952 Presence of prosthetic heart valve: Secondary | ICD-10-CM

## 2010-08-14 MED ORDER — AMOXICILLIN 500 MG PO CAPS: ORAL_CAPSULE | ORAL | Status: DC

## 2010-08-14 NOTE — Telephone Encounter (Signed)
antibiotic called in for dental cleaning.  H/O MVP  appt is 5/30 @ 11:15- [facility] [address].

## 2010-08-14 NOTE — Telephone Encounter (Signed)
I spoke with the patient to verify no PCN allergies. I will call in amoxicillin 500mg  to take 4 tablets by mouth one hour prior to dental cleaning.

## 2010-08-21 ENCOUNTER — Ambulatory Visit (HOSPITAL_COMMUNITY): Payer: Medicare Other | Attending: Cardiology

## 2010-08-21 DIAGNOSIS — I379 Nonrheumatic pulmonary valve disorder, unspecified: Secondary | ICD-10-CM | POA: Insufficient documentation

## 2010-08-21 DIAGNOSIS — Z9889 Other specified postprocedural states: Secondary | ICD-10-CM

## 2010-08-21 DIAGNOSIS — I10 Essential (primary) hypertension: Secondary | ICD-10-CM | POA: Insufficient documentation

## 2010-08-21 DIAGNOSIS — I080 Rheumatic disorders of both mitral and aortic valves: Secondary | ICD-10-CM | POA: Insufficient documentation

## 2010-08-21 DIAGNOSIS — I059 Rheumatic mitral valve disease, unspecified: Secondary | ICD-10-CM

## 2010-08-21 DIAGNOSIS — E785 Hyperlipidemia, unspecified: Secondary | ICD-10-CM | POA: Insufficient documentation

## 2010-08-21 DIAGNOSIS — I079 Rheumatic tricuspid valve disease, unspecified: Secondary | ICD-10-CM | POA: Insufficient documentation

## 2010-08-21 DIAGNOSIS — R0989 Other specified symptoms and signs involving the circulatory and respiratory systems: Secondary | ICD-10-CM | POA: Insufficient documentation

## 2010-08-21 DIAGNOSIS — R0609 Other forms of dyspnea: Secondary | ICD-10-CM | POA: Insufficient documentation

## 2010-10-20 ENCOUNTER — Inpatient Hospital Stay (INDEPENDENT_AMBULATORY_CARE_PROVIDER_SITE_OTHER)
Admission: RE | Admit: 2010-10-20 | Discharge: 2010-10-20 | Disposition: A | Discharge status: Home / Self Care | Payer: Medicare Other | Source: Ambulatory Visit | Attending: Emergency Medicine | Admitting: Emergency Medicine

## 2010-10-20 DIAGNOSIS — N390 Urinary tract infection, site not specified: Secondary | ICD-10-CM

## 2010-10-20 LAB — POCT URINALYSIS DIP (DEVICE)
Bilirubin Urine: NEGATIVE
Leukocytes, UA: NEGATIVE
Nitrite: NEGATIVE
pH: 8 (ref 5.0–8.0)

## 2010-10-20 LAB — WET PREP, GENITAL: Clue Cells Wet Prep HPF POC: NONE SEEN

## 2010-10-21 LAB — URINE CULTURE: Culture: NO GROWTH

## 2010-11-27 ENCOUNTER — Telehealth: Payer: Self-pay | Admitting: Cardiology

## 2010-11-27 DIAGNOSIS — Z952 Presence of prosthetic heart valve: Secondary | ICD-10-CM

## 2010-11-27 NOTE — Telephone Encounter (Signed)
Spoke with pt, she is needing tooth extraction with dr ward lambert at [facility]. They will need for the pt to stop her asa for 7 days prior to the appt and she needs the okay for that. Will forward for dr Jens Som review Alexandria Leach

## 2010-11-27 NOTE — Telephone Encounter (Signed)
Pt calling wanting RX for amoxicillin for pt to have dental procedure. Pt also wanted to get something in writing stating that pt can be off blood thinner for seven days mailed to pt house. Please return pt call to discuss further.

## 2010-11-28 ENCOUNTER — Encounter: Payer: Self-pay | Admitting: *Deleted

## 2010-11-28 MED ORDER — AMOXICILLIN 500 MG PO CAPS: ORAL_CAPSULE | ORAL | Status: DC

## 2010-11-28 NOTE — Telephone Encounter (Signed)
Spoke with pt, note will be mailed to the pt Alexandria Leach

## 2010-11-28 NOTE — Telephone Encounter (Signed)
OK to be off ASA. Needs SBE prophylaxis. Alexandria Leach

## 2010-12-11 ENCOUNTER — Emergency Department (HOSPITAL_COMMUNITY)
Admission: EM | Admit: 2010-12-11 | Discharge: 2010-12-11 | Disposition: A | Discharge status: Home / Self Care | Payer: Medicare Other | Attending: Emergency Medicine | Admitting: Emergency Medicine

## 2010-12-11 DIAGNOSIS — R05 Cough: Secondary | ICD-10-CM | POA: Insufficient documentation

## 2010-12-11 DIAGNOSIS — R059 Cough, unspecified: Secondary | ICD-10-CM | POA: Insufficient documentation

## 2010-12-11 DIAGNOSIS — J329 Chronic sinusitis, unspecified: Secondary | ICD-10-CM | POA: Insufficient documentation

## 2010-12-11 DIAGNOSIS — R0982 Postnasal drip: Secondary | ICD-10-CM | POA: Insufficient documentation

## 2010-12-11 DIAGNOSIS — I10 Essential (primary) hypertension: Secondary | ICD-10-CM | POA: Insufficient documentation

## 2010-12-11 DIAGNOSIS — IMO0001 Reserved for inherently not codable concepts without codable children: Secondary | ICD-10-CM | POA: Insufficient documentation

## 2010-12-11 DIAGNOSIS — Z79899 Other long term (current) drug therapy: Secondary | ICD-10-CM | POA: Insufficient documentation

## 2010-12-11 DIAGNOSIS — J3489 Other specified disorders of nose and nasal sinuses: Secondary | ICD-10-CM | POA: Insufficient documentation

## 2010-12-11 DIAGNOSIS — R51 Headache: Secondary | ICD-10-CM | POA: Insufficient documentation

## 2011-01-13 ENCOUNTER — Emergency Department (HOSPITAL_COMMUNITY)
Admission: EM | Admit: 2011-01-13 | Discharge: 2011-01-13 | Disposition: A | Discharge status: Home / Self Care | Payer: Medicare Other | Attending: Emergency Medicine | Admitting: Emergency Medicine

## 2011-01-13 DIAGNOSIS — I10 Essential (primary) hypertension: Secondary | ICD-10-CM | POA: Insufficient documentation

## 2011-01-13 DIAGNOSIS — Z79899 Other long term (current) drug therapy: Secondary | ICD-10-CM | POA: Insufficient documentation

## 2011-01-13 DIAGNOSIS — M069 Rheumatoid arthritis, unspecified: Secondary | ICD-10-CM | POA: Insufficient documentation

## 2011-01-13 DIAGNOSIS — H5789 Other specified disorders of eye and adnexa: Secondary | ICD-10-CM | POA: Insufficient documentation

## 2011-01-13 DIAGNOSIS — E7800 Pure hypercholesterolemia, unspecified: Secondary | ICD-10-CM | POA: Insufficient documentation

## 2011-01-13 DIAGNOSIS — H11419 Vascular abnormalities of conjunctiva, unspecified eye: Secondary | ICD-10-CM | POA: Insufficient documentation

## 2011-01-13 DIAGNOSIS — H5710 Ocular pain, unspecified eye: Secondary | ICD-10-CM | POA: Insufficient documentation

## 2011-02-26 ENCOUNTER — Ambulatory Visit: Payer: Medicare Other | Attending: Rheumatology

## 2011-02-26 DIAGNOSIS — R5381 Other malaise: Secondary | ICD-10-CM | POA: Insufficient documentation

## 2011-02-26 DIAGNOSIS — M2560 Stiffness of unspecified joint, not elsewhere classified: Secondary | ICD-10-CM | POA: Insufficient documentation

## 2011-02-26 DIAGNOSIS — R262 Difficulty in walking, not elsewhere classified: Secondary | ICD-10-CM | POA: Insufficient documentation

## 2011-02-26 DIAGNOSIS — IMO0001 Reserved for inherently not codable concepts without codable children: Secondary | ICD-10-CM | POA: Insufficient documentation

## 2011-02-26 DIAGNOSIS — M2550 Pain in unspecified joint: Secondary | ICD-10-CM | POA: Insufficient documentation

## 2011-03-05 ENCOUNTER — Ambulatory Visit: Payer: Medicare Other

## 2011-03-07 ENCOUNTER — Encounter: Payer: Medicare Other | Admitting: Rehabilitation

## 2011-03-14 ENCOUNTER — Encounter: Payer: Medicare Other | Admitting: Rehabilitation

## 2011-03-15 ENCOUNTER — Encounter: Payer: Medicare Other | Admitting: Rehabilitation

## 2011-03-16 ENCOUNTER — Other Ambulatory Visit: Payer: Self-pay

## 2011-03-16 ENCOUNTER — Emergency Department (HOSPITAL_COMMUNITY)
Admission: EM | Admit: 2011-03-16 | Discharge: 2011-03-16 | Disposition: A | Discharge status: Other Acute Inpatient Hospital | Payer: Medicare Other | Attending: Emergency Medicine | Admitting: Emergency Medicine

## 2011-03-16 ENCOUNTER — Encounter (HOSPITAL_COMMUNITY): Payer: Self-pay | Admitting: Adult Health

## 2011-03-16 DIAGNOSIS — R002 Palpitations: Secondary | ICD-10-CM

## 2011-03-16 DIAGNOSIS — IMO0002 Reserved for concepts with insufficient information to code with codable children: Secondary | ICD-10-CM | POA: Insufficient documentation

## 2011-03-16 DIAGNOSIS — R4689 Other symptoms and signs involving appearance and behavior: Secondary | ICD-10-CM

## 2011-03-16 DIAGNOSIS — Z86718 Personal history of other venous thrombosis and embolism: Secondary | ICD-10-CM | POA: Insufficient documentation

## 2011-03-16 DIAGNOSIS — I10 Essential (primary) hypertension: Secondary | ICD-10-CM

## 2011-03-16 LAB — RAPID URINE DRUG SCREEN, HOSP PERFORMED
Amphetamines: NOT DETECTED
Barbiturates: NOT DETECTED
Benzodiazepines: POSITIVE — AB
Cocaine: NOT DETECTED
Opiates: NOT DETECTED
Tetrahydrocannabinol: NOT DETECTED

## 2011-03-16 LAB — CBC
HCT: 40.9 % (ref 36.0–46.0)
Hemoglobin: 13.6 g/dL (ref 12.0–15.0)
MCH: 27.4 pg (ref 26.0–34.0)
MCHC: 33.3 g/dL (ref 30.0–36.0)
MCV: 82.3 fL (ref 78.0–100.0)
Platelets: 304 K/uL (ref 150–400)
RBC: 4.97 MIL/uL (ref 3.87–5.11)
RDW: 14 % (ref 11.5–15.5)
WBC: 4.5 K/uL (ref 4.0–10.5)

## 2011-03-16 LAB — COMPREHENSIVE METABOLIC PANEL
ALT: 17 U/L (ref 0–35)
Albumin: 4.5 g/dL (ref 3.5–5.2)
Alkaline Phosphatase: 60 U/L (ref 39–117)
GFR calc Af Amer: 79 mL/min — ABNORMAL LOW (ref >90)
Glucose, Bld: 93 mg/dL (ref 70–99)
Potassium: 3.7 mEq/L (ref 3.5–5.1)
Sodium: 139 mEq/L (ref 135–145)
Total Protein: 9 g/dL — ABNORMAL HIGH (ref 6.0–8.3)

## 2011-03-16 LAB — ETHANOL: Alcohol, Ethyl (B): <11 mg/dL (ref 0–11)

## 2011-03-16 LAB — ACETAMINOPHEN LEVEL: Acetaminophen (Tylenol), Serum: <15 ug/mL (ref 10–30)

## 2011-03-16 MED ORDER — AMLODIPINE BESYLATE 5 MG PO TABS: 5.0000 mg | ORAL_TABLET | Freq: Every day | ORAL | Status: DC

## 2011-03-16 MED ORDER — METOPROLOL SUCCINATE ER 25 MG PO TB24: 25.0000 mg | ORAL_TABLET | Freq: Every day | ORAL | Status: DC

## 2011-03-16 NOTE — ED Notes (Signed)
Sent over from [facility] to be medically cleared. [facility] can not take custody of patient at this time due heart condition and current hypertension. IVC papers are being faxed. Pt is able to go to [facility] once Blood pressure is stable and medically cleared. Homicidal ideation toward husband. Spoke with [name] Potter at [facility].

## 2011-03-16 NOTE — ED Notes (Signed)
Report has been called to [facility] at [place], waiting for [employer] to transport

## 2011-03-16 NOTE — ED Provider Notes (Signed)
History     CSN: [id number]  Arrival date & time 03/16/11  1246   First MD Initiated Contact with Patient 03/16/11 1248    1:24 PM HPI According to triage note the patient was sent from a [facility] for medical clearance . Patient apparently has IVC papers due to homicide ideation towards husband. Pt denies suicidal or homicidal ideation, hallucinations or delusions.  Patient is a 60 y.o. female presenting with hypertension. The history is provided by the patient.  Hypertension This is a chronic problem. The problem occurs constantly. The problem has been unchanged. Pertinent negatives include no abdominal pain, chest pain, congestion, coughing, headaches, nausea, neck pain, numbness, sore throat, vomiting or weakness. The symptoms are aggravated by nothing. Treatments tried: antihypertensives. The treatment provided mild relief.    Past Medical History  Diagnosis Date  . Depression   . HTN (hypertension)   . Rheumatic heart disease     s/p mitral valve repair 1980  . Hypercholesteremia   . Insomnia   . Anxiety   . DVT (deep venous thrombosis)   . Pain, joint, multiple sites   . Fibromyalgia   . Connective tissue disease     Past Surgical History  Procedure Date  . Mitral valve repair   . Vaginal hysterectomy     for fibroids -- partial    Family History  Problem Relation Age of Onset  . Lupus    . Kidney failure    . Bone cancer    . Heart disease Mother     MI in her 80s    History  Substance Use Topics  . Smoking status: Never Smoker   . Smokeless tobacco: Not on file  . Alcohol Use: 0.6 oz/week    1 Cans of beer per week     occasional    OB History    Grav Para Term Preterm Abortions TAB SAB Ect Mult Living                  Review of Systems  HENT: Negative for congestion, sore throat and neck pain.   Respiratory: Negative for cough and shortness of breath.   Cardiovascular: Negative for chest pain.  Gastrointestinal: Negative for nausea, vomiting  and abdominal pain.  Neurological: Negative for weakness, numbness and headaches.  Psychiatric/Behavioral: Negative for behavioral problems and agitation.  All other systems reviewed and are negative.    Allergies  Codeine and Sulfa antibiotics  Home Medications   Current Outpatient Rx  Name Route Sig Dispense Refill  . AMLODIPINE BESYLATE 5 MG PO TABS Oral Take 5 mg by mouth daily.      . AMOXICILLIN 500 MG PO CAPS  Take 4 capsules by mouth one hour prior to dental work 4 capsule 1  . ASPIRIN 325 MG PO TBEC Oral Take 325 mg by mouth daily.      Marland Kitchen VITAMIN D 1000 UNITS PO TABS Oral Take 1,000 Units by mouth daily.      Marland Kitchen CLONAZEPAM 1 MG PO TABS Oral Take 1 mg by mouth 2 (two) times daily as needed.      . ENALAPRIL MALEATE 20 MG PO TABS Oral Take 20 mg by mouth daily.      Marland Kitchen FAMOTIDINE 20 MG PO TABS Oral Take 20 mg by mouth 2 (two) times daily.      Marland Kitchen METOPROLOL SUCCINATE ER 25 MG PO TB24 Oral Take 1 tablet (25 mg total) by mouth daily. 30 tablet 11  . ESTROVEN PO  Oral Take by mouth at bedtime.        BP 165/90  Pulse 94  Temp(Src) 98.5 F (36.9 C) (Oral)  Resp 20  SpO2 100%  Physical Exam  Vitals reviewed. Constitutional: She is oriented to person, place, and time. Vital signs are normal. She appears well-developed and well-nourished.  HENT:  Head: Normocephalic and atraumatic.  Right Ear: External ear normal.  Left Ear: External ear normal.  Nose: Nose normal.  Mouth/Throat: Oropharynx is clear and moist. No oropharyngeal exudate.  Eyes: Conjunctivae are normal. Pupils are equal, round, and reactive to light.  Neck: Normal range of motion. Neck supple.  Cardiovascular: Normal rate, regular rhythm and normal heart sounds.  Exam reveals no gallop and no friction rub.   No murmur heard. Pulmonary/Chest: Effort normal and breath sounds normal. She has no wheezes. She has no rhonchi. She has no rales. She exhibits no tenderness.  Musculoskeletal: Normal range of motion.    Neurological: She is alert and oriented to person, place, and time. Coordination normal.  Skin: Skin is warm and dry. No rash noted. No erythema. No pallor.  Psychiatric: She has a normal mood and affect. Her behavior is normal.    ED Course  Procedures  ED ECG REPORT   Date: 03/16/2011  EKG Time: 3:49 PM  Rate: 92  Rhythm: normal sinus rhythm,  normal EKG, normal sinus rhythm, unchanged from previous tracings  Axis: normal    Intervals:none  ST&T Change: Nonspecific T waves    Narrative Interpretation:   MDM    Patient is mildly hypertensive. Recommended taking blood pressure medication as directed and followup primary care physician. I discussed this with a Medical sales representative member. They will accept the patient. We'll prescribe blood pressure medication in case patient is unable to get them from home.     Thomasene Lot, [place] 03/16/11 [phone]

## 2011-03-17 NOTE — ED Provider Notes (Signed)
Medical screening examination/treatment/procedure(s) were performed by non-physician practitioner and as supervising physician I was immediately available for consultation/collaboration.   Suzi Roots, MD 03/17/11 [phone]

## 2011-04-09 ENCOUNTER — Emergency Department (HOSPITAL_COMMUNITY)
Admission: EM | Admit: 2011-04-09 | Discharge: 2011-04-09 | Disposition: A | Discharge status: Home / Self Care | Payer: Medicare Other | Attending: Emergency Medicine | Admitting: Emergency Medicine

## 2011-04-09 ENCOUNTER — Encounter (HOSPITAL_COMMUNITY): Payer: Self-pay | Admitting: *Deleted

## 2011-04-09 DIAGNOSIS — R000 Tachycardia, unspecified: Secondary | ICD-10-CM | POA: Insufficient documentation

## 2011-04-09 DIAGNOSIS — F19939 Other psychoactive substance use, unspecified with withdrawal, unspecified: Secondary | ICD-10-CM | POA: Insufficient documentation

## 2011-04-09 DIAGNOSIS — F341 Dysthymic disorder: Secondary | ICD-10-CM | POA: Insufficient documentation

## 2011-04-09 DIAGNOSIS — Z7982 Long term (current) use of aspirin: Secondary | ICD-10-CM | POA: Insufficient documentation

## 2011-04-09 DIAGNOSIS — R358 Other polyuria: Secondary | ICD-10-CM | POA: Insufficient documentation

## 2011-04-09 DIAGNOSIS — G4700 Insomnia, unspecified: Secondary | ICD-10-CM | POA: Insufficient documentation

## 2011-04-09 DIAGNOSIS — I099 Rheumatic heart disease, unspecified: Secondary | ICD-10-CM | POA: Insufficient documentation

## 2011-04-09 DIAGNOSIS — E7800 Pure hypercholesterolemia, unspecified: Secondary | ICD-10-CM | POA: Insufficient documentation

## 2011-04-09 DIAGNOSIS — Z79899 Other long term (current) drug therapy: Secondary | ICD-10-CM | POA: Insufficient documentation

## 2011-04-09 DIAGNOSIS — F1320 Sedative, hypnotic or anxiolytic dependence, uncomplicated: Secondary | ICD-10-CM | POA: Insufficient documentation

## 2011-04-09 DIAGNOSIS — R3589 Other polyuria: Secondary | ICD-10-CM | POA: Insufficient documentation

## 2011-04-09 DIAGNOSIS — Z86718 Personal history of other venous thrombosis and embolism: Secondary | ICD-10-CM | POA: Insufficient documentation

## 2011-04-09 DIAGNOSIS — N3090 Cystitis, unspecified without hematuria: Secondary | ICD-10-CM | POA: Insufficient documentation

## 2011-04-09 DIAGNOSIS — I10 Essential (primary) hypertension: Secondary | ICD-10-CM | POA: Insufficient documentation

## 2011-04-09 DIAGNOSIS — R259 Unspecified abnormal involuntary movements: Secondary | ICD-10-CM | POA: Insufficient documentation

## 2011-04-09 DIAGNOSIS — R630 Anorexia: Secondary | ICD-10-CM | POA: Insufficient documentation

## 2011-04-09 LAB — URINALYSIS, ROUTINE W REFLEX MICROSCOPIC
Bilirubin Urine: NEGATIVE
Ketones, ur: NEGATIVE mg/dL
Nitrite: NEGATIVE
Urobilinogen, UA: 1 mg/dL (ref 0.0–1.0)

## 2011-04-09 MED ORDER — NITROFURANTOIN MONOHYD MACRO 100 MG PO CAPS: 100.0000 mg | ORAL_CAPSULE | Freq: Two times a day (BID) | ORAL | Status: AC

## 2011-04-09 NOTE — ED Notes (Signed)
Pt reports she is having withdrawal from her clonazepam 1mg , last dose was Friday. Pt reports she hasn't been able to sleep. Reports poor appetite and shakiness. Pt states she was told by her Pscyh MD to stop taking approx 1.5 weeks ago. Pt also reports she is having urinary frequency.

## 2011-04-09 NOTE — ED Provider Notes (Signed)
History     CSN: [id number]  Arrival date & time 04/09/11  1135   First MD Initiated Contact with Patient 04/09/11 1358      Chief Complaint  Patient presents with  . Withdrawal  . Polyuria     HPI The patient p/w generalized complaints; shakiness, insomnia, poor appetite.  Onset was gradual, following (cold-turkey) cessation of her Klonazepam.  No relief w anything, no sig pain, no disorientation, no dysuria.  +Polyuria.   Past Medical History  Diagnosis Date  . Depression   . HTN (hypertension)   . Rheumatic heart disease     s/p mitral valve repair 1980  . Hypercholesteremia   . Insomnia   . Anxiety   . DVT (deep venous thrombosis)   . Pain, joint, multiple sites   . Fibromyalgia   . Connective tissue disease     Past Surgical History  Procedure Date  . Mitral valve repair   . Vaginal hysterectomy     for fibroids -- partial    Family History  Problem Relation Age of Onset  . Lupus    . Kidney failure    . Bone cancer    . Heart disease Mother     MI in her 57s    History  Substance Use Topics  . Smoking status: Never Smoker   . Smokeless tobacco: Not on file  . Alcohol Use: 0.6 oz/week    1 Cans of beer per week     occasional    OB History    Grav Para Term Preterm Abortions TAB SAB Ect Mult Living                  Review of Systems  Constitutional:       HPI  HENT:       HPI otherwise negative  Eyes: Negative.   Respiratory:       HPI, otherwise negative  Cardiovascular:       HPI, otherwise nmegative  Gastrointestinal: Negative for vomiting.  Genitourinary:       HPI, otherwise negative  Musculoskeletal:       HPI, otherwise negative  Skin: Negative.   Neurological: Negative for syncope.    Allergies  Codeine and Sulfa antibiotics  Home Medications   Current Outpatient Rx  Name Route Sig Dispense Refill  . AMLODIPINE BESYLATE 5 MG PO TABS Oral Take 1 tablet (5 mg total) by mouth daily. 30 tablet 0  . ASPIRIN 325 MG PO  TBEC Oral Take 325 mg by mouth daily.      Marland Kitchen VITAMIN D 1000 UNITS PO TABS Oral Take 1,000 Units by mouth daily.      Marland Kitchen CITALOPRAM HYDROBROMIDE 40 MG PO TABS Oral Take 40 mg by mouth daily.    Marland Kitchen CLONAZEPAM 1 MG PO TABS Oral Take 1 mg by mouth 2 (two) times daily as needed.      . ENALAPRIL MALEATE 20 MG PO TABS Oral Take 20 mg by mouth daily.      Marland Kitchen FAMOTIDINE 20 MG PO TABS Oral Take 20 mg by mouth 2 (two) times daily.      Marland Kitchen GABAPENTIN 300 MG PO CAPS Oral Take 300 mg by mouth 3 (three) times daily.      Marland Kitchen METOPROLOL SUCCINATE ER 25 MG PO TB24 Oral Take 25 mg by mouth daily.    . TRAMADOL HCL 50 MG PO TABS Oral Take 50 mg by mouth every 6 (six) hours as needed. Maximum dose=  8 tablets per day for pain      BP 147/83  Pulse 92  Temp(Src) 98.9 F (37.2 C) (Oral)  Resp 16  SpO2 100%  Physical Exam  Nursing note and vitals reviewed. Constitutional: She is oriented to person, place, and time. She appears well-developed and well-nourished. No distress.  HENT:  Head: Normocephalic and atraumatic.  Eyes: Conjunctivae and EOM are normal.  Cardiovascular: Regular rhythm.  Tachycardia present.   Pulmonary/Chest: Effort normal and breath sounds normal. No stridor. No respiratory distress.  Abdominal: She exhibits no distension.  Musculoskeletal: She exhibits no edema.  Neurological: She is alert and oriented to person, place, and time. No cranial nerve deficit.  Skin: Skin is warm and dry.  Psychiatric: She has a normal mood and affect.    ED Course  Procedures (including critical care time)  Labs Reviewed  URINALYSIS, ROUTINE W REFLEX MICROSCOPIC - Abnormal; Notable for the following:    Hgb urine dipstick TRACE (*)    Leukocytes, UA TRACE (*)    All other components within normal limits  URINE MICROSCOPIC-ADD ON  CBC  BASIC METABOLIC PANEL   No results found.   No diagnosis found.    MDM  61-year-old female presents with one week of generalized complaints following cessation  of her clonazepam.  Symptoms are consistent with withdrawal, the patient's request for assistance with withdrawal resulted in a discussion of benzodiazepines being the appropriate therapy.  She deferred this offer.  The patient's polyuria may be 2/2 uti, though the UA was only suggestive of this.  Given her complaint, she will be treated w Macrobid and be advised to f/u with her PMD.        Gerhard Munch, MD 04/09/11 1558

## 2011-04-22 ENCOUNTER — Encounter: Payer: Self-pay | Admitting: Cardiology

## 2011-04-22 ENCOUNTER — Ambulatory Visit (INDEPENDENT_AMBULATORY_CARE_PROVIDER_SITE_OTHER): Payer: Medicare Other | Admitting: Cardiology

## 2011-04-22 VITALS — BP 172/92 | HR 89 | Ht 61.0 in | Wt 181.0 lb

## 2011-04-22 DIAGNOSIS — I350 Nonrheumatic aortic (valve) stenosis: Secondary | ICD-10-CM | POA: Insufficient documentation

## 2011-04-22 DIAGNOSIS — I10 Essential (primary) hypertension: Secondary | ICD-10-CM

## 2011-04-22 MED ORDER — METOPROLOL-HCTZ ER 50-12.5 MG PO TB24: 1.0000 | ORAL_TABLET | Freq: Every day | ORAL | Status: DC

## 2011-04-22 NOTE — Assessment & Plan Note (Signed)
Status post mitral valve repair. Continue SBE prophylaxis. Repeat echocardiogram when she returns in one year.

## 2011-04-22 NOTE — Patient Instructions (Signed)
Your physician wants you to follow-up in: ONE YEAR You will receive a reminder letter in the mail two months in advance. If you don't receive a letter, please call our office to schedule the follow-up appointment.   STOP METOPROLOL SUCC  START METOPROLOL HCTZ 50/12.5 MG ONCE DAILY  Your physician recommends that you return for lab work in: ONE WEEK

## 2011-04-22 NOTE — Assessment & Plan Note (Signed)
Blood pressure elevated. Increase Toprol to 50 mg daily and add HCTZ 12.5 mg daily. Check potassium and renal function in one week. Increase medications based on followup blood pressure readings.

## 2011-04-22 NOTE — Assessment & Plan Note (Signed)
Plan followup echocardiogram in one year. Aortic stenosis sounds mild on examination.

## 2011-04-22 NOTE — Progress Notes (Signed)
HPI:61 year old female with past medical history of mitral valve repair secondary to rheumatic heart disease for fu. Patient is status post mitral valve repair in 1980. Operative report not available. Last echocardiogram June of 2012. This showed normal LV function with EF 55-65; grade 2 diastolic dysfunction, mild LAE, mild AS with mean gradient of 10 mmHg, trace AI, mild MS with mean gradient 8 mmHg. Mitral valve area by pressure half-time was 1.23 cm^2; mild MR. I last saw her in May of 2012. Since then, the patient denies any dyspnea on exertion, orthopnea, PND, pedal edema, palpitations, syncope or chest pain.    Current Outpatient Prescriptions  Medication Sig Dispense Refill  . amLODipine (NORVASC) 5 MG tablet Take 1 tablet (5 mg total) by mouth daily.  30 tablet  0  . aspirin 325 MG EC tablet Take 325 mg by mouth daily.        . chlorhexidine (PERIDEX) 0.12 % solution Use as directed 15 mLs in the mouth or throat 2 (two) times daily.      . cholecalciferol (VITAMIN D) 1000 UNITS tablet Take 1,000 Units by mouth daily.        . famotidine (PEPCID) 20 MG tablet Take 20 mg by mouth 2 (two) times daily.        Marland Kitchen gabapentin (NEURONTIN) 300 MG capsule Take 300 mg by mouth 3 (three) times daily.        . metoprolol succinate (TOPROL-XL) 25 MG 24 hr tablet Take 25 mg by mouth daily.      . traMADol (ULTRAM) 50 MG tablet Take 50 mg by mouth every 6 (six) hours as needed. Maximum dose= 8 tablets per day for pain         Past Medical History  Diagnosis Date  . Depression   . HTN (hypertension)   . Rheumatic heart disease     s/p mitral valve repair 1980  . Hypercholesteremia   . Insomnia   . Anxiety   . DVT (deep venous thrombosis)   . Pain, joint, multiple sites   . Fibromyalgia   . Connective tissue disease     Past Surgical History  Procedure Date  . Mitral valve repair   . Vaginal hysterectomy     for fibroids -- partial    History   Social History  . Marital Status:  Married    Spouse Name: N/A    Number of Children: 4  . Years of Education: N/A   Occupational History  .      Disability   Social History Main Topics  . Smoking status: Never Smoker   . Smokeless tobacco: Not on file  . Alcohol Use: 0.6 oz/week    1 Cans of beer per week     occasional  . Drug Use: No  . Sexually Active: Not on file   Other Topics Concern  . Not on file   Social History Narrative  . No narrative on file    ROS: no fevers or chills, productive cough, hemoptysis, dysphasia, odynophagia, melena, hematochezia, dysuria, hematuria, rash, seizure activity, orthopnea, PND, pedal edema, claudication. Remaining systems are negative.  Physical Exam: Well-developed well-nourished in no acute distress.  Skin is warm and dry.  HEENT is normal.  Neck is supple. No thyromegaly.  Chest is clear to auscultation with normal expansion.  Cardiovascular exam is regular rate and rhythm. 2/6 systolic murmur left sternal border. Abdominal exam nontender or distended. No masses palpated. Extremities show no edema. neuro grossly intact  ECG 03/16/2011-sinus rhythm at a rate of 92. Axis normal. Nonspecific T-wave changes.

## 2011-04-22 NOTE — Assessment & Plan Note (Signed)
Management per primary care. 

## 2011-04-24 ENCOUNTER — Telehealth: Payer: Self-pay | Admitting: Cardiology

## 2011-04-24 NOTE — Telephone Encounter (Signed)
Pt

## 2011-04-26 ENCOUNTER — Telehealth: Payer: Self-pay | Admitting: Cardiology

## 2011-04-26 ENCOUNTER — Other Ambulatory Visit: Payer: Self-pay | Admitting: *Deleted

## 2011-04-26 MED ORDER — HYDROCHLOROTHIAZIDE 12.5 MG PO CAPS: 12.5000 mg | ORAL_CAPSULE | Freq: Every day | ORAL | Status: DC

## 2011-04-26 MED ORDER — METOPROLOL SUCCINATE ER 50 MG PO TB24: 50.0000 mg | ORAL_TABLET | Freq: Every day | ORAL | Status: DC

## 2011-04-26 NOTE — Telephone Encounter (Signed)
Change to toprol 50 mg daily and HCTZ 12.5 mg daily Alexandria Leach

## 2011-04-26 NOTE — Telephone Encounter (Signed)
Pt went to pick up her new Rx for Toprolol / HTCZ and it was $34.99 and she can not afford medication is there something cheaper she can take

## 2011-04-26 NOTE — Telephone Encounter (Signed)
Will forward to Dr Crenshaw for review 

## 2011-04-26 NOTE — Telephone Encounter (Signed)
Sent in Rx for two sep drugs per Dr. Jens Som.  Pt notified.

## 2011-04-29 ENCOUNTER — Other Ambulatory Visit: Payer: Medicare Other | Admitting: *Deleted

## 2011-05-06 ENCOUNTER — Other Ambulatory Visit (INDEPENDENT_AMBULATORY_CARE_PROVIDER_SITE_OTHER): Payer: Medicare Other | Admitting: *Deleted

## 2011-05-06 DIAGNOSIS — I10 Essential (primary) hypertension: Secondary | ICD-10-CM

## 2011-05-06 LAB — BASIC METABOLIC PANEL
BUN: 13 mg/dL (ref 6–23)
Creatinine, Ser: 1 mg/dL (ref 0.4–1.2)
GFR: 73.04 mL/min (ref >60.00)
Glucose, Bld: 95 mg/dL (ref 70–99)
Potassium: 3.3 mEq/L — ABNORMAL LOW (ref 3.5–5.1)

## 2011-05-07 ENCOUNTER — Telehealth: Payer: Self-pay | Admitting: *Deleted

## 2011-05-07 DIAGNOSIS — E876 Hypokalemia: Secondary | ICD-10-CM

## 2011-05-07 MED ORDER — POTASSIUM CHLORIDE CRYS ER 20 MEQ PO TBCR: 20.0000 meq | EXTENDED_RELEASE_TABLET | Freq: Every day | ORAL | Status: DC

## 2011-05-07 NOTE — Telephone Encounter (Signed)
Message copied by Freddi Starr on Tue May 07, 2011  2:08 PM ------      Message from: Lewayne Bunting      Created: Mon May 06, 2011  2:48 PM       kcl 20 meq po daily, bmet one week      Olga Millers

## 2011-05-14 ENCOUNTER — Ambulatory Visit (INDEPENDENT_AMBULATORY_CARE_PROVIDER_SITE_OTHER): Payer: Medicare Other | Admitting: *Deleted

## 2011-05-14 DIAGNOSIS — E876 Hypokalemia: Secondary | ICD-10-CM

## 2011-05-14 DIAGNOSIS — I10 Essential (primary) hypertension: Secondary | ICD-10-CM

## 2011-05-14 LAB — BASIC METABOLIC PANEL
CO2: 23 mEq/L (ref 19–32)
Calcium: 9.6 mg/dL (ref 8.4–10.5)
Creatinine, Ser: 1 mg/dL (ref 0.4–1.2)
GFR: 69.8 mL/min (ref >60.00)
Glucose, Bld: 96 mg/dL (ref 70–99)
Sodium: 140 mEq/L (ref 135–145)

## 2011-06-25 ENCOUNTER — Other Ambulatory Visit (HOSPITAL_COMMUNITY): Payer: Self-pay | Admitting: Internal Medicine

## 2011-06-25 DIAGNOSIS — Z1231 Encounter for screening mammogram for malignant neoplasm of breast: Secondary | ICD-10-CM

## 2011-07-19 ENCOUNTER — Ambulatory Visit (HOSPITAL_COMMUNITY)
Admission: RE | Admit: 2011-07-19 | Discharge: 2011-07-19 | Disposition: A | Discharge status: Home / Self Care | Payer: Medicare Other | Source: Ambulatory Visit | Attending: Internal Medicine | Admitting: Internal Medicine

## 2011-07-19 DIAGNOSIS — Z1231 Encounter for screening mammogram for malignant neoplasm of breast: Secondary | ICD-10-CM | POA: Insufficient documentation

## 2011-07-31 ENCOUNTER — Emergency Department (INDEPENDENT_AMBULATORY_CARE_PROVIDER_SITE_OTHER)
Admission: EM | Admit: 2011-07-31 | Discharge: 2011-07-31 | Disposition: A | Discharge status: Home / Self Care | Payer: Medicare Other | Source: Home / Self Care | Attending: Emergency Medicine | Admitting: Emergency Medicine

## 2011-07-31 ENCOUNTER — Encounter (HOSPITAL_COMMUNITY): Payer: Self-pay

## 2011-07-31 ENCOUNTER — Emergency Department (INDEPENDENT_AMBULATORY_CARE_PROVIDER_SITE_OTHER): Payer: Medicare Other

## 2011-07-31 DIAGNOSIS — R109 Unspecified abdominal pain: Secondary | ICD-10-CM

## 2011-07-31 LAB — POCT URINALYSIS DIP (DEVICE)
Leukocytes, UA: NEGATIVE
Nitrite: NEGATIVE
Protein, ur: 30 mg/dL — AB
pH: 5.5 (ref 5.0–8.0)

## 2011-07-31 MED ORDER — KETOROLAC TROMETHAMINE 60 MG/2ML IM SOLN
60.0000 mg | Freq: Once | INTRAMUSCULAR | Status: AC
  Administered 2011-07-31: 60 mg via INTRAMUSCULAR

## 2011-07-31 MED ORDER — TAMSULOSIN HCL 0.4 MG PO CAPS: 0.4000 mg | ORAL_CAPSULE | Freq: Every day | ORAL | Status: DC

## 2011-07-31 MED ORDER — KETOROLAC TROMETHAMINE 60 MG/2ML IM SOLN
INTRAMUSCULAR | Status: AC
  Filled 2011-07-31: qty 2

## 2011-07-31 MED ORDER — OXYCODONE-ACETAMINOPHEN 5-325 MG PO TABS: 2.0000 | ORAL_TABLET | ORAL | Status: AC | PRN

## 2011-07-31 NOTE — ED Notes (Signed)
C/o 3 day duration of pain left flank area; denies n/v/d; denies bloody or black stool; has not used any medicine for this problem; looks uncomfortble

## 2011-07-31 NOTE — ED Provider Notes (Signed)
History     CSN: [id number]  Arrival date & time 07/31/11  0820   First MD Initiated Contact with Patient 07/31/11 [phone]      Chief Complaint  Patient presents with  . Flank Pain    (Consider location/radiation/quality/duration/timing/severity/associated sxs/prior treatment) Patient is a 61 y.o. female presenting with flank pain. The history is provided by the patient. No language interpreter was used.  Flank Pain This is a new problem. The current episode started yesterday. The problem occurs constantly. The problem has not changed since onset.Associated symptoms include abdominal pain. She has tried nothing for the symptoms.    Past Medical History  Diagnosis Date  . Depression   . HTN (hypertension)   . Rheumatic heart disease     s/p mitral valve repair 1980  . Hypercholesteremia   . Insomnia   . Anxiety   . DVT (deep venous thrombosis)   . Pain, joint, multiple sites   . Fibromyalgia   . Connective tissue disease     Past Surgical History  Procedure Date  . Mitral valve repair   . Vaginal hysterectomy     for fibroids -- partial    Family History  Problem Relation Age of Onset  . Lupus    . Kidney failure    . Bone cancer    . Heart disease Mother     MI in her 35s    History  Substance Use Topics  . Smoking status: Never Smoker   . Smokeless tobacco: Not on file  . Alcohol Use: 0.6 oz/week    1 Cans of beer per week     occasional    OB History    Grav Para Term Preterm Abortions TAB SAB Ect Mult Living                  Review of Systems  Gastrointestinal: Positive for abdominal pain.  Genitourinary: Positive for flank pain.  All other systems reviewed and are negative.    Allergies  Codeine and Sulfa antibiotics  Home Medications   Current Outpatient Rx  Name Route Sig Dispense Refill  . AMLODIPINE BESYLATE 5 MG PO TABS Oral Take 1 tablet (5 mg total) by mouth daily. 30 tablet 0  . ASPIRIN 325 MG PO TBEC Oral Take 325 mg by mouth  daily.      . CHLORHEXIDINE GLUCONATE 0.12 % MT SOLN Mouth/Throat Use as directed 15 mLs in the mouth or throat 2 (two) times daily.    Marland Kitchen VITAMIN D 1000 UNITS PO TABS Oral Take 1,000 Units by mouth daily.      Marland Kitchen FAMOTIDINE 20 MG PO TABS Oral Take 20 mg by mouth 2 (two) times daily.      Marland Kitchen GABAPENTIN 300 MG PO CAPS Oral Take 300 mg by mouth 3 (three) times daily.      Marland Kitchen HYDROCHLOROTHIAZIDE 12.5 MG PO CAPS Oral Take 1 capsule (12.5 mg total) by mouth daily. 30 capsule 11  . METOPROLOL SUCCINATE ER 50 MG PO TB24 Oral Take 1 tablet (50 mg total) by mouth daily. Take with or immediately following a meal. 30 tablet 11  . POTASSIUM CHLORIDE CRYS ER 20 MEQ PO TBCR Oral Take 1 tablet (20 mEq total) by mouth daily. 30 tablet 12  . TRAMADOL HCL 50 MG PO TABS Oral Take 50 mg by mouth every 6 (six) hours as needed. Maximum dose= 8 tablets per day for pain      There were no vitals taken for  this visit.  Physical Exam  Nursing note and vitals reviewed. Constitutional: She is oriented to person, place, and time. She appears well-developed and well-nourished.  HENT:  Head: Normocephalic and atraumatic.  Right Ear: External ear normal.  Left Ear: External ear normal.  Nose: Nose normal.  Mouth/Throat: Oropharynx is clear and moist.  Cardiovascular: Normal rate and regular rhythm.   Pulmonary/Chest: Effort normal and breath sounds normal.  Abdominal: Soft. There is tenderness.  Musculoskeletal: Normal range of motion.  Neurological: She is alert and oriented to person, place, and time. She has normal reflexes.  Skin: Skin is warm.  Psychiatric: She has a normal mood and affect.    ED Course  Procedures (including critical care time)  Labs Reviewed  POCT URINALYSIS DIP (DEVICE) - Abnormal; Notable for the following:    Bilirubin Urine SMALL (*)    Hgb urine dipstick TRACE (*)    Protein, ur 30 (*)    All other components within normal limits   No results found.   No diagnosis  found.    MDM  Urine shows blood,  Kub  No abnormality.    I suspect pt has a stone.   I will treat with flomax and percocet.   Pt advised to recheck with her MD tomorrow.   Pt advised she may need a ct scan if symptoms worsen or cahnge.  Lonia Skinner [place], [place] 07/31/11 1012

## 2011-07-31 NOTE — ED Provider Notes (Signed)
Medical screening examination/treatment/procedure(s) were performed by non-physician practitioner and as supervising physician I was immediately available for consultation/collaboration.  Saajan Willmon, M.D.   Alaa Mullally C Katelin Kutsch, MD 07/31/11 1215 

## 2011-07-31 NOTE — Discharge Instructions (Signed)
Kidney Stones Kidney stones (ureteral lithiasis) are deposits that form inside your kidneys. The intense pain is caused by the stone moving through the urinary tract. When the stone moves, the ureter goes into spasm around the stone. The stone is usually passed in the urine.  CAUSES   A disorder that makes certain neck glands produce too much parathyroid hormone (primary hyperparathyroidism).   A buildup of uric acid crystals.   Narrowing (stricture) of the ureter.   A kidney obstruction present at birth (congenital obstruction).   Previous surgery on the kidney or ureters.   Numerous kidney infections.  SYMPTOMS   Feeling sick to your stomach (nauseous).   Throwing up (vomiting).   Blood in the urine (hematuria).   Pain that usually spreads (radiates) to the groin.   Frequency or urgency of urination.  DIAGNOSIS   Taking a history and physical exam.   Blood or urine tests.   Computerized X-ray scan (CT scan).   Occasionally, an examination of the inside of the urinary bladder (cystoscopy) is performed.  TREATMENT   Observation.   Increasing your fluid intake.   Surgery may be needed if you have severe pain or persistent obstruction.  The size, location, and chemical composition are all important variables that will determine the proper choice of action for you. Talk to your caregiver to better understand your situation so that you will minimize the risk of injury to yourself and your kidney.  HOME CARE INSTRUCTIONS   Drink enough water and fluids to keep your urine clear or pale yellow.   Strain all urine through the provided strainer. Keep all particulate matter and stones for your caregiver to see. The stone causing the pain may be as small as a grain of salt. It is very important to use the strainer each and every time you pass your urine. The collection of your stone will allow your caregiver to analyze it and verify that a stone has actually passed.   Only take  over-the-counter or prescription medicines for pain, discomfort, or fever as directed by your caregiver.   Make a follow-up appointment with your caregiver as directed.   Get follow-up X-rays if required. The absence of pain does not always mean that the stone has passed. It may have only stopped moving. If the urine remains completely obstructed, it can cause loss of kidney function or even complete destruction of the kidney. It is your responsibility to make sure X-rays and follow-ups are completed. Ultrasounds of the kidney can show blockages and the status of the kidney. Ultrasounds are not associated with any radiation and can be performed easily in a matter of minutes.  SEEK IMMEDIATE MEDICAL CARE IF:   Pain cannot be controlled with the prescribed medicine.   You have a fever.   The severity or intensity of pain increases over 18 hours and is not relieved by pain medicine.   You develop a new onset of abdominal pain.   You feel faint or pass out.  MAKE SURE YOU:   Understand these instructions.   Will watch your condition.   Will get help right away if you are not doing well or get worse.  Document Released: 03/04/2005 Document Revised: 02/21/2011 Document Reviewed: 06/30/2009 [employer] Patient Information 2012 [employer]. 

## 2011-08-01 ENCOUNTER — Emergency Department (HOSPITAL_COMMUNITY)
Admission: EM | Admit: 2011-08-01 | Discharge: 2011-08-01 | Disposition: A | Discharge status: Home / Self Care | Payer: Medicare Other | Attending: Emergency Medicine | Admitting: Emergency Medicine

## 2011-08-01 ENCOUNTER — Encounter (HOSPITAL_COMMUNITY): Payer: Self-pay | Admitting: *Deleted

## 2011-08-01 ENCOUNTER — Emergency Department (HOSPITAL_COMMUNITY): Payer: Medicare Other

## 2011-08-01 DIAGNOSIS — R109 Unspecified abdominal pain: Secondary | ICD-10-CM | POA: Insufficient documentation

## 2011-08-01 DIAGNOSIS — IMO0001 Reserved for inherently not codable concepts without codable children: Secondary | ICD-10-CM | POA: Insufficient documentation

## 2011-08-01 DIAGNOSIS — Z7982 Long term (current) use of aspirin: Secondary | ICD-10-CM | POA: Insufficient documentation

## 2011-08-01 DIAGNOSIS — M069 Rheumatoid arthritis, unspecified: Secondary | ICD-10-CM | POA: Insufficient documentation

## 2011-08-01 DIAGNOSIS — N949 Unspecified condition associated with female genital organs and menstrual cycle: Secondary | ICD-10-CM | POA: Insufficient documentation

## 2011-08-01 DIAGNOSIS — F411 Generalized anxiety disorder: Secondary | ICD-10-CM | POA: Insufficient documentation

## 2011-08-01 DIAGNOSIS — Z86718 Personal history of other venous thrombosis and embolism: Secondary | ICD-10-CM | POA: Insufficient documentation

## 2011-08-01 DIAGNOSIS — I10 Essential (primary) hypertension: Secondary | ICD-10-CM | POA: Insufficient documentation

## 2011-08-01 DIAGNOSIS — R110 Nausea: Secondary | ICD-10-CM | POA: Insufficient documentation

## 2011-08-01 DIAGNOSIS — R10814 Left lower quadrant abdominal tenderness: Secondary | ICD-10-CM | POA: Insufficient documentation

## 2011-08-01 DIAGNOSIS — E7800 Pure hypercholesterolemia, unspecified: Secondary | ICD-10-CM | POA: Insufficient documentation

## 2011-08-01 DIAGNOSIS — F329 Major depressive disorder, single episode, unspecified: Secondary | ICD-10-CM | POA: Insufficient documentation

## 2011-08-01 DIAGNOSIS — F3289 Other specified depressive episodes: Secondary | ICD-10-CM | POA: Insufficient documentation

## 2011-08-01 DIAGNOSIS — Z79899 Other long term (current) drug therapy: Secondary | ICD-10-CM | POA: Insufficient documentation

## 2011-08-01 LAB — URINALYSIS, ROUTINE W REFLEX MICROSCOPIC
Bilirubin Urine: NEGATIVE
Ketones, ur: NEGATIVE mg/dL
Leukocytes, UA: NEGATIVE
Nitrite: NEGATIVE
Protein, ur: NEGATIVE mg/dL
Urobilinogen, UA: 1 mg/dL (ref 0.0–1.0)

## 2011-08-01 LAB — CBC
MCH: 27.5 pg (ref 26.0–34.0)
MCHC: 33.2 g/dL (ref 30.0–36.0)
Platelets: 243 10*3/uL (ref 150–400)
RDW: 15.1 % (ref 11.5–15.5)

## 2011-08-01 LAB — DIFFERENTIAL
Basophils Absolute: 0 10*3/uL (ref 0.0–0.1)
Basophils Relative: 0 % (ref 0–1)
Eosinophils Absolute: 0.1 10*3/uL (ref 0.0–0.7)
Monocytes Relative: 8 % (ref 3–12)
Neutrophils Relative %: 55 % (ref 43–77)

## 2011-08-01 LAB — BASIC METABOLIC PANEL
BUN: 11 mg/dL (ref 6–23)
GFR calc Af Amer: 72 mL/min — ABNORMAL LOW (ref >90)
GFR calc non Af Amer: 62 mL/min — ABNORMAL LOW (ref >90)
Potassium: 3.9 mEq/L (ref 3.5–5.1)
Sodium: 141 mEq/L (ref 135–145)

## 2011-08-01 MED ORDER — IOHEXOL 300 MG/ML  SOLN
20.0000 mL | INTRAMUSCULAR | Status: AC
Start: 2011-08-01 — End: 2011-08-01
  Administered 2011-08-01 (×2): 20 mL via ORAL

## 2011-08-01 MED ORDER — IOHEXOL 300 MG/ML  SOLN
100.0000 mL | Freq: Once | INTRAMUSCULAR | Status: AC | PRN
  Administered 2011-08-01: 100 mL via INTRAVENOUS

## 2011-08-01 MED ORDER — ONDANSETRON HCL 4 MG/2ML IJ SOLN
4.0000 mg | Freq: Once | INTRAMUSCULAR | Status: AC
  Administered 2011-08-01: 4 mg via INTRAVENOUS
  Filled 2011-08-01: qty 2

## 2011-08-01 MED ORDER — HYDROMORPHONE HCL PF 1 MG/ML IJ SOLN
1.0000 mg | Freq: Once | INTRAMUSCULAR | Status: AC
  Administered 2011-08-01: 1 mg via INTRAVENOUS
  Filled 2011-08-01: qty 1

## 2011-08-01 NOTE — ED Notes (Signed)
Pa in to discuss results of pt ct and reeval

## 2011-08-01 NOTE — ED Notes (Signed)
Ct (pete) aware pt has finished po prep

## 2011-08-01 NOTE — ED Provider Notes (Signed)
History     CSN: [id number]  Arrival date & time 08/01/11  0457   First MD Initiated Contact with Patient 08/01/11 0602      Chief Complaint  Patient presents with  . Abdominal Pain    (Consider location/radiation/quality/duration/timing/severity/associated sxs/prior treatment) HPI History provided by pt.   Pt has had constant pain in LLQ for the past 5 days.  Radiating to left flank since this morning.  Aggravated by movement and has had mild improvement w/ percocet.  Associated w/ nausea.  Denies fever, vomiting, diarrhea, hematochezia/melena and GU sx.  Pt has h/o nephrolithiasis and this pain feels different.  Per prior chart, pt seen for same at [facility] yesterday.  U/A pos for hematuria and KUB neg.  Pt thought to have a kidney stone and d/c'd home w/ the percocet and flomax.  Pain seems to have worsened today.  Pt does not have a urologist.  Denies trauma/heavy lifting.  Past abd surgeries include partial hysterectomy.    Past Medical History  Diagnosis Date  . Depression   . HTN (hypertension)   . Rheumatic heart disease     s/p mitral valve repair 1980  . Hypercholesteremia   . Insomnia   . Anxiety   . DVT (deep venous thrombosis)   . Pain, joint, multiple sites   . Fibromyalgia   . Connective tissue disease     Past Surgical History  Procedure Date  . Mitral valve repair   . Vaginal hysterectomy     for fibroids -- partial    Family History  Problem Relation Age of Onset  . Lupus    . Kidney failure    . Bone cancer    . Heart disease Mother     MI in her 29s    History  Substance Use Topics  . Smoking status: Never Smoker   . Smokeless tobacco: Not on file  . Alcohol Use: 0.6 oz/week    1 Cans of beer per week     occasional    OB History    Grav Para Term Preterm Abortions TAB SAB Ect Mult Living                  Review of Systems  All other systems reviewed and are negative.    Allergies  Sulfa antibiotics  Home Medications   Current  Outpatient Rx  Name Route Sig Dispense Refill  . AMLODIPINE BESYLATE 5 MG PO TABS Oral Take 1 tablet (5 mg total) by mouth daily. 30 tablet 0  . ASPIRIN 325 MG PO TBEC Oral Take 325 mg by mouth daily.      . CHLORHEXIDINE GLUCONATE 0.12 % MT SOLN Mouth/Throat Use as directed 15 mLs in the mouth or throat 2 (two) times daily.    Marland Kitchen VITAMIN D 1000 UNITS PO TABS Oral Take 1,000 Units by mouth daily.      Marland Kitchen CINNAMON PO Oral Take 1 tablet by mouth daily.    Marland Kitchen CLONAZEPAM 0.5 MG PO TABS Oral Take 0.5 mg by mouth at bedtime.     Marland Kitchen FAMOTIDINE 20 MG PO TABS Oral Take 20 mg by mouth 2 (two) times daily.      Marland Kitchen GABAPENTIN 300 MG PO CAPS Oral Take 300 mg by mouth 3 (three) times daily.      Marland Kitchen HYDROCHLOROTHIAZIDE 12.5 MG PO CAPS Oral Take 1 capsule (12.5 mg total) by mouth daily. 30 capsule 11  . L-METHYLFOLATE-B6-B12 3-35-2 MG PO TABS Oral Take 1  tablet by mouth daily.    Marland Kitchen METOPROLOL SUCCINATE ER 50 MG PO TB24 Oral Take 1 tablet (50 mg total) by mouth daily. Take with or immediately following a meal. 30 tablet 11  . OXYCODONE-ACETAMINOPHEN 5-325 MG PO TABS Oral Take 2 tablets by mouth every 4 (four) hours as needed for pain. 16 tablet 0  . POTASSIUM CHLORIDE CRYS ER 20 MEQ PO TBCR Oral Take 1 tablet (20 mEq total) by mouth daily. 30 tablet 12  . TAMSULOSIN HCL 0.4 MG PO CAPS Oral Take 1 capsule (0.4 mg total) by mouth daily. 15 capsule 0  . TRAMADOL HCL 50 MG PO TABS Oral Take 50 mg by mouth every 6 (six) hours as needed. Maximum dose= 8 tablets per day for pain      BP 139/75  Pulse 87  Temp(Src) 98.2 F (36.8 C) (Oral)  Resp 19  SpO2 100%  Physical Exam  Nursing note and vitals reviewed. Constitutional: She is oriented to person, place, and time. She appears well-developed and well-nourished. No distress.  HENT:  Head: Normocephalic and atraumatic.  Eyes:       Normal appearance  Neck: Normal range of motion.  Cardiovascular: Normal rate and regular rhythm.   Pulmonary/Chest: Effort normal  and breath sounds normal. No respiratory distress.  Abdominal: Soft. Bowel sounds are normal. She exhibits no distension and no mass. There is no rebound and no guarding.       Mild tenderness LLQ  Genitourinary:       No CVA tenderness.  Nml external genitalia.  Mild-mod RIGHT adnexal ttp.  No left adnexal ttp.    Musculoskeletal: Normal range of motion.  Neurological: She is alert and oriented to person, place, and time.  Skin: Skin is warm and dry. No rash noted.  Psychiatric: She has a normal mood and affect. Her behavior is normal.    ED Course  Procedures (including critical care time)  Labs Reviewed  BASIC METABOLIC PANEL - Abnormal; Notable for the following:    GFR calc non Af Amer 62 (*)    GFR calc Af Amer 72 (*)    All other components within normal limits  URINALYSIS, ROUTINE W REFLEX MICROSCOPIC  CBC  DIFFERENTIAL   Dg Abd 1 View  07/31/2011  *RADIOLOGY REPORT*  Clinical Data: Left flank pain.  ABDOMEN - 1 VIEW  Comparison: 04/04/2010  Findings: Moderate stool burden throughout the colon. There is a nonobstructive bowel gas pattern.  No supine evidence of free air. No organomegaly or suspicious calcification.  No acute bony abnormality.  IMPRESSION: Moderate stool burden.  No acute findings.  Original Report Authenticated By: Cyndie Chime, M.D.     No diagnosis found.    MDM  61yo F presents w/ LLQ pain w/ associated nausea x 5d.  Clinically diagnosed and treated for kidney stone at [facility] yesterday but pain worse this morning and now radiating to left flank.  Feels different than past stones.  On exam, afebrile, NAD but appears uncomfortable w/ movement, mild LLQ tenderness w/out guarding/rebound and mild RIGHT adnexal ttp.  Pt receiving IV dilaudid and zofran for sx.  Labs and CT to r/o diverticulitis have been ordered.  Will move to CDU.  6:27 AM         Otilio Miu, PA 08/01/11 [phone]

## 2011-08-01 NOTE — ED Provider Notes (Signed)
Medical screening examination/treatment/procedure(s) were conducted as a shared visit with non-physician practitioner(s) and myself.  I personally evaluated the patient during the encounter  The patient evaluated after CT, returned. She has had crampy left sided flank pain for 2 weeks. No change in eating, stooling, or nausea. No fever. She has had injections for fibromyalgia. She cannot recall, if she's had any in the left flank. On evaluation, the abdomen is soft. There is mild periumbilical tenderness, but no redness, swelling, or palpable hernia. Flank. Examination is normal; without tenderness, swelling or deformity.  Assessment: Nonspecific left flank pain; with air in soft tissues but no evidence for abscess. She has an incidental periumbilical hernia; is likely associated with her prior tubal ligation. She is stable for discharge with outpatient management; including referral to PCP and surgery.  Flint Melter, MD 08/01/11 1001

## 2011-08-01 NOTE — ED Notes (Signed)
Lt abd pain since Sunday she was seen at [facility] and diagnosed with a kidney stone .  The pain i=has not stopped and is not getting better

## 2011-08-01 NOTE — ED Provider Notes (Signed)
Care assumed of pt in CDU from Schinlever, PA-C, awaiting CT abdomen/pelvis. Patient has had pain to the left lower abdomen for the past 5 days which has been crampy in nature. No fever or chills. No nausea or vomiting. History of nephrolithiasis but this is inconsistent with previous kidney stone pain.  CT shows small umbilical hernia containing fat. There is minimal stranding around the area which is likely inflammation. She also appears to have some questionable gas in the soft tissue to the posterior left flank area, just above the umbilicus. This may be related to medication injection. Patient states that she has had several joint injections to her back and hip for her fibromyalgia, but none recently. She tells me that all the injections were on the right side. I discussed the findings with Dr. Effie Shy, who examined the patient with me. Her abdomen is soft and she has mild to moderate tenderness without guarding in the lower abdomen. She appears to have a small umbilical hernia which is nontender to palpation. There is no overlying redness. No overlying skin changes to the back/flank area to indicate cellulitis or infection. We discussed with the radiologist, who states there is no evidence for abscess/soft tissue stranding to the area of soft tissue gas. Based on this, CT findings do not seem entirely consistent with the areas where she is tender. We'll have her make an outpatient appointment with surgery to discuss hernia repair, and have her followup with her PCP in several days for a recheck of this pain. She is to continue her current pain medication. Return precautions discussed.  Grant Fontana, [place] 08/01/11 1010

## 2011-08-01 NOTE — Discharge Instructions (Signed)
Your CT findings do not show a definitive cause for your pain. Please continue your current pain medication regimen. Plan to make a follow up with your primary care doctor as soon as possible for a recheck. If you develop nausea, vomiting, high fever, notice skin color changes, have worsening pain, or with any other concerning symptoms, return to the ED.  You need to make a follow up appointment with surgery to discuss repairing the hernia. Please call the number above for an appointment.  RESOURCE GUIDE  Dental Problems  Patients with Medicaid: [facility] [phone] [address]                                           [phone] [address] Phone:  [phone]                                                  Phone:  [phone]  If unable to pay or uninsured, contact:  [facility] or [facility]. to become qualified for the adult dental clinic.  Chronic Pain Problems Contact [facility]  [phone] Patients need to be referred by their primary care doctor.  Insufficient Money for Medicine Contact [employer]:  call "211" or [facility] 219-026-7864.  No Primary Care Doctor Call [facility]  [phone] Other agencies that provide inexpensive medical care    [facility]  [phone]    [facility]  [phone]    [facility]  [phone]    [facility]  [phone]    [facility]  [phone]    [facility] : [facility]  [phone]  Psychological Services [facility]  [phone] [facility] Services  [phone] [facility] Mental [facility]   [phone] (emergency services [phone])  Substance Abuse Resources [facility]  [phone] [facility] [phone] [facility] [phone] Floydene Flock [phone] Residential & Outpatient Substance Abuse Program  [phone]  Abuse/Neglect [facility] Child Abuse  Hotline 646-184-9354 [facility] Child Abuse Hotline 785 250 5016 (After Hours)  Emergency Shelter [facility] [phone]  Maternity Homes [facility] at [facility] [place] [facility] 478-662-1095 [facility] [phone]  MRSA Hotline #:   530-808-6109    [facility] Resources  [facility] [place]     [employer]                          [facility] Dept. [address]                        [address]      [address]  Patrecia Pace  Michell Heinrich Phone:  [phone]                                   Phone:  [phone]                 Phone:  [phone]  [facility] Mental Health Phone:  [phone]  [facility] Child Abuse Hotline (478)841-1857 913-598-5258 (After Hours)  Abdominal Pain (Nonspecific) Your exam might not show the exact reason you have abdominal pain. Since there are many different causes of abdominal pain, another checkup and more tests may be needed. It is very important to follow up for lasting (persistent) or worsening symptoms. A possible cause of abdominal pain in any person who still has his or her appendix is acute appendicitis. Appendicitis is often hard to diagnose. Normal blood tests, urine tests, ultrasound, and CT scans do not completely rule out early appendicitis or other causes of abdominal pain. Sometimes, only the changes that happen over time will allow appendicitis and other causes of abdominal pain to be determined. Other potential problems that may require surgery may also take time to become more apparent. Because of this, it is important that you follow all of the instructions below. HOME CARE INSTRUCTIONS   Rest as much as possible.   Do not eat solid food until your pain is gone.   While adults or children have pain: A diet of water, weak decaffeinated tea, broth or bouillon,  gelatin, oral rehydration solutions (ORS), frozen ice pops, or ice chips may be helpful.   When pain is gone in adults or children: Start a light diet (dry toast, crackers, applesauce, or white rice). Increase the diet slowly as long as it does not bother you. Eat no dairy products (including cheese and eggs) and no spicy, fatty, fried, or high-fiber foods.   Use no alcohol, caffeine, or cigarettes.   Take your regular medicines unless your caregiver told you not to.   Take any prescribed medicine as directed.   Only take over-the-counter or prescription medicines for pain, discomfort, or fever as directed by your caregiver. Do not give aspirin to children.  If your caregiver has given you a follow-up appointment, it is very important to keep that appointment. Not keeping the appointment could result in a permanent injury and/or lasting (chronic) pain and/or disability. If there is any problem keeping the appointment, you must call to reschedule.  SEEK IMMEDIATE MEDICAL CARE IF:   Your pain is not gone in 24 hours.   Your pain becomes worse, changes location, or feels different.   You or your child has an oral temperature above 102 F (38.9 C), not controlled by medicine.   Your baby is older than 3 months with a rectal temperature of 102 F (38.9 C) or higher.   Your baby is 54 months old or younger with a rectal temperature of 100.4 F (38 C) or higher.   You have shaking chills.   You keep throwing up (vomiting) or cannot drink liquids.   There is blood in your vomit or you see blood in your bowel movements.   Your bowel movements become dark or black.   You have frequent bowel movements.   Your bowel movements stop (become blocked) or you cannot pass gas.   You have bloody, frequent, or painful urination.   You have yellow discoloration in the skin or whites of the eyes.  Your stomach becomes bloated or bigger.   You have dizziness or fainting.   You have chest or  back pain.  MAKE SURE YOU:   Understand these instructions.   Will watch your condition.   Will get help right away if you are not doing well or get worse.  Document Released: 03/04/2005 Document Revised: 02/21/2011 Document Reviewed: 01/30/2009 [facility] Patient Information 2012 [place], [place].

## 2011-08-02 NOTE — ED Provider Notes (Signed)
Medical screening examination/treatment/procedure(s) were performed by non-physician practitioner and as supervising physician I was immediately available for consultation/collaboration.   Forbes Cellar, MD 08/02/11 [phone]

## 2011-08-07 ENCOUNTER — Encounter (INDEPENDENT_AMBULATORY_CARE_PROVIDER_SITE_OTHER): Payer: Self-pay | Admitting: General Surgery

## 2011-08-07 ENCOUNTER — Ambulatory Visit (INDEPENDENT_AMBULATORY_CARE_PROVIDER_SITE_OTHER): Payer: Medicare Other | Admitting: General Surgery

## 2011-08-07 VITALS — BP 190/96 | HR 96 | Temp 98.2°F | Ht 61.0 in | Wt 177.2 lb

## 2011-08-07 DIAGNOSIS — K429 Umbilical hernia without obstruction or gangrene: Secondary | ICD-10-CM

## 2011-08-07 NOTE — Patient Instructions (Signed)
You have a small umbilical hernia which contains a small amount of fat. You are not having any symptoms from this except when you are examined.  Your recent evaluation in the emergency department for left lower quadrant abdominal pain showed no evidence of ovarian or colonic inflammatory disease.  I do not think that your umbilical hernia is the source of your symptoms.  Since you are not interested in elective repair of your umbilical hernia, then I have referred you back to your primary care physician, Dr. Pearson Grippe, for further evaluation and management. He will decide whether you should be referred to gastroenterology or not.

## 2011-08-07 NOTE — Progress Notes (Signed)
Patient ID: Alexandria Leach, female   DOB: 1950-03-20, 61 y.o.   MRN: [id number]  Chief Complaint  Patient presents with  . Pre-op Exam    eval umb hernia    HPI Alexandria Leach is a 61 y.o. female.  She is referred by Dr. Mancel Bale in the emergency department for evaluation of an umbilical hernia.  This patient has significant past history of mitral valve repair in 1980 for rheumatic heart disease. She also has a history of open hysterectomy without oophorectomy. She also has history of fibromyalgia with periodic injections of the right gluteal area. She's been treated for deep venous thrombosis in the past but has not taken Coumadin for 2 years.  She is followed by Dr. Pearson Grippe for primary care and follow Dr. Olga Millers for cardiology. She is on Toprol, Norvasc, HCTZ and potassium.  Her only GI symptoms are that she went to the emergency department recently on May 16 complaining of left lower quadrant pain. No fever chills nausea vomiting or change in bowel habits. She chronically has constipation with bowel movements every 2-3 days. She had a colonoscopy 2 years ago in [place] and was told everything looked fine. Evaluation  in emergency department CT scan was performed which showed a small umbilical hernia containing fat but really no other inflammatory or obstructive or neoplastic process.  She was referred here to talk about her umbilical hernia. She states that the umbilical hernia is not painful and was never given her any problems. HPI  Past Medical History  Diagnosis Date  . Depression   . HTN (hypertension)   . Rheumatic heart disease     s/p mitral valve repair 1980  . Hypercholesteremia   . Insomnia   . Anxiety   . DVT (deep venous thrombosis)   . Pain, joint, multiple sites   . Fibromyalgia   . Connective tissue disease   . Arthritis   . Heart murmur     Past Surgical History  Procedure Date  . Mitral valve repair   . Tubal ligation 1980  . Vaginal  hysterectomy 1992    for fibroids -- partial    Family History  Problem Relation Age of Onset  . Lupus    . Kidney failure    . Bone cancer    . Heart disease Mother     MI in her 8s  . ALS Mother   . Kidney disease Father   . Cancer Brother     bone  . Cancer Maternal Uncle     bone    Social History History  Substance Use Topics  . Smoking status: Never Smoker   . Smokeless tobacco: Not on file  . Alcohol Use: No    Allergies  Allergen Reactions  . Sulfa Antibiotics Nausea And Vomiting    Current Outpatient Prescriptions  Medication Sig Dispense Refill  . amLODipine (NORVASC) 5 MG tablet Take 1 tablet (5 mg total) by mouth daily.  30 tablet  0  . aspirin 325 MG EC tablet Take 325 mg by mouth daily.        . chlorhexidine (PERIDEX) 0.12 % solution Use as directed 15 mLs in the mouth or throat 2 (two) times daily.      . cholecalciferol (VITAMIN D) 1000 UNITS tablet Take 1,000 Units by mouth daily.        Marland Kitchen CINNAMON PO Take 1 tablet by mouth daily.      . clonazePAM (KLONOPIN) 0.5 MG  tablet Take 0.5 mg by mouth at bedtime.       . famotidine (PEPCID) 20 MG tablet Take 20 mg by mouth 2 (two) times daily.        Marland Kitchen gabapentin (NEURONTIN) 300 MG capsule Take 300 mg by mouth 3 (three) times daily.        . hydrochlorothiazide (MICROZIDE) 12.5 MG capsule Take 1 capsule (12.5 mg total) by mouth daily.  30 capsule  11  . l-methylfolate-B6-B12 (METANX) 3-35-2 MG TABS Take 1 tablet by mouth daily.      . metoprolol succinate (TOPROL XL) 50 MG 24 hr tablet Take 1 tablet (50 mg total) by mouth daily. Take with or immediately following a meal.  30 tablet  11  . oxyCODONE-acetaminophen (PERCOCET) 5-325 MG per tablet Take 2 tablets by mouth every 4 (four) hours as needed for pain.  16 tablet  0  . potassium chloride SA (K-DUR,KLOR-CON) 20 MEQ tablet Take 1 tablet (20 mEq total) by mouth daily.  30 tablet  12  . Tamsulosin HCl (FLOMAX) 0.4 MG CAPS Take 1 capsule (0.4 mg total) by  mouth daily.  15 capsule  0  . traMADol (ULTRAM) 50 MG tablet Take 50 mg by mouth every 6 (six) hours as needed. Maximum dose= 8 tablets per day for pain        Review of Systems Review of Systems  Constitutional: Negative for fever, chills and unexpected weight change.  HENT: Negative for hearing loss, congestion, sore throat, trouble swallowing and voice change.   Eyes: Negative for visual disturbance.  Respiratory: Negative for cough, shortness of breath and wheezing.   Cardiovascular: Negative for chest pain, palpitations and leg swelling.  Gastrointestinal: Positive for abdominal pain and constipation. Negative for nausea, vomiting, diarrhea, blood in stool, abdominal distention, anal bleeding and rectal pain.  Genitourinary: Negative for hematuria, vaginal bleeding and difficulty urinating.  Musculoskeletal: Negative for arthralgias.  Skin: Negative for rash and wound.  Neurological: Negative for seizures, syncope and headaches.  Hematological: Negative for adenopathy. Does not bruise/bleed easily.  Psychiatric/Behavioral: Negative for confusion.    Blood pressure 190/96, pulse 96, temperature 98.2 F (36.8 C), temperature source Temporal, height 5\' 1"  (1.549 m), weight 177 lb 3.2 oz (80.377 kg), SpO2 98.00%.  Physical Exam Physical Exam  Constitutional: She is oriented to person, place, and time. She appears well-developed and well-nourished. No distress.  HENT:  Head: Normocephalic and atraumatic.  Nose: Nose normal.  Mouth/Throat: No oropharyngeal exudate.  Eyes: Conjunctivae and EOM are normal. Pupils are equal, round, and reactive to light. Left eye exhibits no discharge. No scleral icterus.  Neck: Neck supple. No JVD present. No tracheal deviation present. No thyromegaly present.  Cardiovascular: Normal rate, regular rhythm and intact distal pulses.   Murmur heard.      Gr. II syst murmer.  Pulmonary/Chest: Effort normal and breath sounds normal. No respiratory  distress. She has no wheezes. She has no rales. She exhibits no tenderness.       Sternotomy scar.  Abdominal: Soft. Bowel sounds are normal. She exhibits no distension and no mass. There is no tenderness. There is no rebound and no guarding.       Small umbilical hernia.  partially reducible. Tender if I pushed hard. Skin healthy. Well-healed Pfannenstiel scar.  Musculoskeletal: She exhibits no edema and no tenderness.  Lymphadenopathy:    She has no cervical adenopathy.  Neurological: She is alert and oriented to person, place, and time. She exhibits normal muscle tone.  Coordination normal.  Skin: Skin is warm. No rash noted. She is not diaphoretic. No erythema. No pallor.  Psychiatric: She has a normal mood and affect. Her behavior is normal. Judgment and thought content normal.    Data Reviewed ED records, CT scan.  Assessment    Umbilical hernia. Small and asymptomatic. I doubt that this is the etiology of her recent Left lower quadrant pain.  Self-limited episode of left lower quadrant pain, uncertain etiology. Considerations would be left adnexal disease or colonic disease, but I am not sure. There is no evidence of any obstructive, ischemic, inflammatory, or neoplastic process on physical exam or CT.  Fibromyalgia  Depression  History mitral valve repair  History laparoscopic tubal ligation  History deep venous thrombosis, off Coumadin since January 2011  History of hysterectomy  Hypertension    Plan    I explained to her the umbilical hernia diagnosis with her. I discussed the pathophysiology and natural history. Since she is asymptomatic she is not interested in having it repaired at this time, and I told her that was appropriate. She will call or return to see me if she becomes symptomatic or changes her mind.  In terms of her recent episode of left lower quadrant pain, I told her that I was uncertain of the cause of that and uncertain of the significance of his  digit was self-limited. I suggested that she discuss this with her primary care physician. Possibly she should be evaluated by gastroenterology.  Return to see me p.r.n.       Angelia Mould. Derrell Lolling, M.D., [facility] General and Minimally invasive Surgery Breast and Colorectal Surgery Office:   [phone] Pager:   [phone]  08/07/2011, 2:58 PM

## 2011-08-21 ENCOUNTER — Telehealth: Payer: Self-pay | Admitting: Cardiology

## 2011-08-21 NOTE — Telephone Encounter (Signed)
New problem:  Patient calling need pre-med before root canal. appt on 6/7.

## 2011-08-21 NOTE — Telephone Encounter (Signed)
Pt calling wanting RX for antibiotic pre procedure--advised amoxicillin 500mg  # 4 po 1 hour pre procedure for root canal

## 2011-09-03 ENCOUNTER — Telehealth: Payer: Self-pay | Admitting: Cardiology

## 2011-09-03 MED ORDER — AMOXICILLIN 500 MG PO CAPS: ORAL_CAPSULE | ORAL | Status: DC

## 2011-09-03 NOTE — Telephone Encounter (Signed)
Spoke with pt, SBE called to the pharm for pt.

## 2011-09-03 NOTE — Telephone Encounter (Signed)
Patient has to go back to the dentist for feeling and needs antibiotic.  Please return call to patient at [phone].

## 2011-10-02 ENCOUNTER — Telehealth: Payer: Self-pay | Admitting: Cardiology

## 2011-10-02 NOTE — Telephone Encounter (Signed)
Repeat echo now [facility]

## 2011-10-02 NOTE — Telephone Encounter (Signed)
Patient called stating she gets echos yearly and had her last one was 08/21/10.  Reviewed 04/22/11 office note from Dr Jens Som and it stated follow up in 1 year for ov and echo.  Advised patient but explained would forward to Victorio Palm. RN and Dr Jens Som to make sure ok to wait until then since patient under impression should be getting yearly.

## 2011-10-02 NOTE — Telephone Encounter (Signed)
New msg Pt was calling about getting echo. Please call to discuss

## 2011-10-04 ENCOUNTER — Encounter (INDEPENDENT_AMBULATORY_CARE_PROVIDER_SITE_OTHER): Payer: Self-pay | Admitting: General Surgery

## 2011-10-04 NOTE — Telephone Encounter (Signed)
PT AWARE.                   ECHO SCHEDULED FOR 10-10-11 AT 4:00 PM.

## 2011-10-05 ENCOUNTER — Encounter (HOSPITAL_COMMUNITY): Payer: Self-pay | Admitting: *Deleted

## 2011-10-05 ENCOUNTER — Emergency Department (HOSPITAL_COMMUNITY)
Admission: EM | Admit: 2011-10-05 | Discharge: 2011-10-05 | Disposition: A | Discharge status: Home / Self Care | Payer: PRIVATE HEALTH INSURANCE | Attending: Emergency Medicine | Admitting: Emergency Medicine

## 2011-10-05 DIAGNOSIS — IMO0001 Reserved for inherently not codable concepts without codable children: Secondary | ICD-10-CM | POA: Insufficient documentation

## 2011-10-05 DIAGNOSIS — Z87442 Personal history of urinary calculi: Secondary | ICD-10-CM | POA: Insufficient documentation

## 2011-10-05 DIAGNOSIS — E7800 Pure hypercholesterolemia, unspecified: Secondary | ICD-10-CM | POA: Insufficient documentation

## 2011-10-05 DIAGNOSIS — I10 Essential (primary) hypertension: Secondary | ICD-10-CM | POA: Insufficient documentation

## 2011-10-05 DIAGNOSIS — Z86718 Personal history of other venous thrombosis and embolism: Secondary | ICD-10-CM | POA: Insufficient documentation

## 2011-10-05 DIAGNOSIS — Z7982 Long term (current) use of aspirin: Secondary | ICD-10-CM | POA: Insufficient documentation

## 2011-10-05 DIAGNOSIS — Z79899 Other long term (current) drug therapy: Secondary | ICD-10-CM | POA: Insufficient documentation

## 2011-10-05 DIAGNOSIS — R109 Unspecified abdominal pain: Secondary | ICD-10-CM | POA: Insufficient documentation

## 2011-10-05 LAB — URINALYSIS, ROUTINE W REFLEX MICROSCOPIC
Bilirubin Urine: NEGATIVE
Hgb urine dipstick: NEGATIVE
Ketones, ur: NEGATIVE mg/dL
Protein, ur: NEGATIVE mg/dL
Urobilinogen, UA: 0.2 mg/dL (ref 0.0–1.0)

## 2011-10-05 LAB — CBC WITH DIFFERENTIAL/PLATELET
Eosinophils Relative: 2 % (ref 0–5)
HCT: 42.6 % (ref 36.0–46.0)
Lymphocytes Relative: 29 % (ref 12–46)
Lymphs Abs: 1.7 10*3/uL (ref 0.7–4.0)
MCV: 83.2 fL (ref 78.0–100.0)
Monocytes Absolute: 0.6 10*3/uL (ref 0.1–1.0)
Platelets: 254 10*3/uL (ref 150–400)
RBC: 5.12 MIL/uL — ABNORMAL HIGH (ref 3.87–5.11)
WBC: 5.8 10*3/uL (ref 4.0–10.5)

## 2011-10-05 LAB — COMPREHENSIVE METABOLIC PANEL
ALT: 19 U/L (ref 0–35)
CO2: 26 mEq/L (ref 19–32)
Calcium: 10.3 mg/dL (ref 8.4–10.5)
GFR calc Af Amer: 69 mL/min — ABNORMAL LOW (ref >90)
GFR calc non Af Amer: 60 mL/min — ABNORMAL LOW (ref >90)
Glucose, Bld: 78 mg/dL (ref 70–99)
Sodium: 138 mEq/L (ref 135–145)

## 2011-10-05 MED ORDER — ONDANSETRON HCL 4 MG/2ML IJ SOLN
4.0000 mg | Freq: Once | INTRAMUSCULAR | Status: AC
  Administered 2011-10-05: 4 mg via INTRAVENOUS
  Filled 2011-10-05: qty 2

## 2011-10-05 MED ORDER — HYDROMORPHONE HCL PF 1 MG/ML IJ SOLN
1.0000 mg | Freq: Once | INTRAMUSCULAR | Status: AC
  Administered 2011-10-05: 1 mg via INTRAVENOUS
  Filled 2011-10-05: qty 1

## 2011-10-05 MED ORDER — SODIUM CHLORIDE 0.9 % IV BOLUS (SEPSIS)
1000.0000 mL | Freq: Once | INTRAVENOUS | Status: AC
  Administered 2011-10-05: 1000 mL via INTRAVENOUS

## 2011-10-05 MED ORDER — HYDROCODONE-ACETAMINOPHEN 5-325 MG PO TABS: 1.0000 | ORAL_TABLET | Freq: Four times a day (QID) | ORAL | Status: AC | PRN

## 2011-10-05 MED ORDER — ONDANSETRON HCL 4 MG PO TABS: 4.0000 mg | ORAL_TABLET | Freq: Four times a day (QID) | ORAL | Status: AC

## 2011-10-05 NOTE — ED Provider Notes (Signed)
History     CSN: [id number]  Arrival date & time 10/05/11  1202   First MD Initiated Contact with Patient 10/05/11 1249      Chief Complaint  Patient presents with  . Flank Pain    left    (Consider location/radiation/quality/duration/timing/severity/associated sxs/prior treatment) HPI Comments: Patient is a 61 year-old female with a history of kidney stones, umbilical hernia who presents with a 1 day history of burning pain in her left lower quadrant. The pain is intermittent, 6 out of 10 on the pain scale and radiates to her left flank.  The pain does not change eating or bowel movements. The pain is worsened by movement and relieved by rest. She states that this pain does not feel like the 8 kidney stones she has had in the past. She notes fatigue and constipation. She denies fever, chills, nausea, vomiting, Patient has had this same pain twice in the past, once in May, again in June, each episode lasting 4 days.  Pt states that the symptoms are completely the same, not worse than prior episodes.   Patient is a 61 y.o. female presenting with flank pain. The history is provided by the patient.  Flank Pain Associated symptoms include abdominal pain. Pertinent negatives include no chills, fever, nausea or vomiting.    Past Medical History  Diagnosis Date  . Depression   . HTN (hypertension)   . Rheumatic heart disease     s/p mitral valve repair 1980  . Hypercholesteremia   . Insomnia   . Anxiety   . DVT (deep venous thrombosis)   . Pain, joint, multiple sites   . Fibromyalgia   . Connective tissue disease   . Arthritis   . Heart murmur   . Mitral stenosis   . Personal history of urinary calculi     Past Surgical History  Procedure Date  . Mitral valve repair   . Tubal ligation 1980  . Vaginal hysterectomy 1992    for fibroids -- partial    Family History  Problem Relation Age of Onset  . Lupus    . Kidney failure    . Bone cancer    . Heart disease Mother    MI in her 8s  . ALS Mother   . Kidney disease Father   . Cancer Brother     bone  . Cancer Maternal Uncle     bone    History  Substance Use Topics  . Smoking status: Never Smoker   . Smokeless tobacco: Not on file  . Alcohol Use: No    OB History    Grav Para Term Preterm Abortions TAB SAB Ect Mult Living                  Review of Systems  Constitutional: Negative for fever and chills.  Gastrointestinal: Positive for abdominal pain. Negative for nausea, vomiting, diarrhea, constipation and blood in stool.  Genitourinary: Positive for flank pain. Negative for dysuria, urgency, frequency, vaginal bleeding and vaginal discharge.    Allergies  Statins and Sulfa antibiotics  Home Medications   Current Outpatient Rx  Name Route Sig Dispense Refill  . AMLODIPINE BESYLATE 5 MG PO TABS Oral Take 5 mg by mouth every morning.    Marland Kitchen AMOXICILLIN 500 MG PO CAPS Oral Take 2,000 mg by mouth as needed. Takes one hour prior to dental procedures.    . ASPIRIN 325 MG PO TBEC Oral Take 325 mg by mouth every morning.     Marland Kitchen  VITAMIN D 1000 UNITS PO TABS Oral Take 1,000 Units by mouth daily.      Marland Kitchen CINNAMON 500 MG PO TABS Oral Take 500 mg by mouth 2 (two) times daily.    Marland Kitchen CLONAZEPAM 1 MG PO TABS Oral Take 0.5 mg by mouth 2 (two) times daily.    Marland Kitchen FAMOTIDINE 20 MG PO TABS Oral Take 20 mg by mouth 2 (two) times daily.      Marland Kitchen GABAPENTIN 300 MG PO CAPS Oral Take 600 mg by mouth at bedtime.     Marland Kitchen HYDROCHLOROTHIAZIDE 12.5 MG PO CAPS Oral Take 12.5 mg by mouth every morning.    . L-METHYLFOLATE-B6-B12 3-35-2 MG PO TABS Oral Take 1 tablet by mouth 2 (two) times daily.     Marland Kitchen METOPROLOL SUCCINATE ER 50 MG PO TB24 Oral Take 50 mg by mouth every morning. Take with or immediately following a meal.    . OMEPRAZOLE 20 MG PO CPDR Oral Take 20 mg by mouth every morning.     Marland Kitchen POTASSIUM CHLORIDE CRYS ER 20 MEQ PO TBCR Oral Take 20 mEq by mouth every morning.    Marland Kitchen TRAMADOL HCL 50 MG PO TABS Oral Take 50 mg by  mouth every 6 (six) hours as needed. For pain. Maximum dose= 8 tablets per day for pain.      BP 130/86  Temp 99 F (37.2 C) (Oral)  Resp 18  SpO2 100%  Physical Exam  Nursing note and vitals reviewed. Constitutional: She is oriented to person, place, and time. She appears well-developed and well-nourished. No distress.  HENT:  Head: Normocephalic and atraumatic.  Neck: Neck supple.  Cardiovascular: Normal rate, regular rhythm and normal heart sounds.   Pulmonary/Chest: Breath sounds normal. No respiratory distress. She has no wheezes. She has no rales. She exhibits no tenderness.  Abdominal: Soft. Bowel sounds are normal. She exhibits no distension and no mass. There is tenderness in the left upper quadrant and left lower quadrant. There is no rebound, no guarding and no CVA tenderness.  Neurological: She is alert and oriented to person, place, and time.  Skin: She is not diaphoretic.    ED Course  Procedures (including critical care time)  Labs Reviewed  CBC WITH DIFFERENTIAL - Abnormal; Notable for the following:    RBC 5.12 (*)     All other components within normal limits  COMPREHENSIVE METABOLIC PANEL - Abnormal; Notable for the following:    Total Protein 8.7 (*)     GFR calc non Af Amer 60 (*)     GFR calc Af Amer 69 (*)     All other components within normal limits  LIPASE, BLOOD  URINALYSIS, ROUTINE W REFLEX MICROSCOPIC  SPECIMEN HOLD   No results found.  2:21 PM Pt seen and examined.  Pt with recurrent abdominal pain, has had similar episode last month and two months ago, each lasting 4 days, no associated symptoms.  Two months ago had CT abd/pelvis that did not reveal etiology for pain.    4:40 PM Patient reports resolution of abdominal pain.  Reexamination of abdomen:  Soft, nondistended, mildly tender to LLQ, LUQ, no guarding, no rebound.    1. Abdominal pain       MDM  Patient presents with recurrent abdominal pain, same as previous two months.  Has  had CT two months ago that did not show reason for this pain.  Pt has since been to surgery and to GI as well as PCP with no  diagnosis.  Patient's pain relieved with one dose of medications in ED. Labs unremarkable.  Abdominal exam is benign, nonsurgical.  Pt d/c home with pain and nausea medication, PCP follow up.  Return precautions given.  Patient verbalizes understanding and agrees with plan.          Rise Patience, [place] 10/05/11 2001

## 2011-10-05 NOTE — ED Notes (Addendum)
Left side pain since May, has been seen several times without finding the cause, continues to have pain, denies any associated symptoms, describes as a knot feeling with burning, moves up from ovarian area

## 2011-10-06 NOTE — ED Provider Notes (Signed)
Medical screening examination/treatment/procedure(s) were performed by non-physician practitioner and as supervising physician I was immediately available for consultation/collaboration.  Michiah Mudry R. Jayzen Paver, MD 10/06/11 0653 

## 2011-10-09 ENCOUNTER — Other Ambulatory Visit (HOSPITAL_COMMUNITY): Payer: Self-pay | Admitting: Cardiology

## 2011-10-09 DIAGNOSIS — I059 Rheumatic mitral valve disease, unspecified: Secondary | ICD-10-CM

## 2011-10-10 ENCOUNTER — Ambulatory Visit (HOSPITAL_COMMUNITY): Payer: PRIVATE HEALTH INSURANCE | Attending: Cardiology

## 2011-10-10 DIAGNOSIS — I359 Nonrheumatic aortic valve disorder, unspecified: Secondary | ICD-10-CM

## 2011-10-10 DIAGNOSIS — I059 Rheumatic mitral valve disease, unspecified: Secondary | ICD-10-CM

## 2011-10-10 DIAGNOSIS — I080 Rheumatic disorders of both mitral and aortic valves: Secondary | ICD-10-CM | POA: Insufficient documentation

## 2011-10-10 DIAGNOSIS — I079 Rheumatic tricuspid valve disease, unspecified: Secondary | ICD-10-CM | POA: Insufficient documentation

## 2011-10-10 DIAGNOSIS — E785 Hyperlipidemia, unspecified: Secondary | ICD-10-CM | POA: Insufficient documentation

## 2011-10-10 NOTE — Progress Notes (Signed)
Echocardiogram performed.  

## 2011-10-16 ENCOUNTER — Other Ambulatory Visit: Payer: Self-pay | Admitting: Internal Medicine

## 2011-10-16 DIAGNOSIS — R109 Unspecified abdominal pain: Secondary | ICD-10-CM

## 2011-10-21 ENCOUNTER — Ambulatory Visit
Admission: RE | Admit: 2011-10-21 | Discharge: 2011-10-21 | Disposition: A | Discharge status: Home / Self Care | Payer: PRIVATE HEALTH INSURANCE | Source: Ambulatory Visit | Attending: Internal Medicine | Admitting: Internal Medicine

## 2011-10-21 DIAGNOSIS — R109 Unspecified abdominal pain: Secondary | ICD-10-CM

## 2011-11-12 ENCOUNTER — Telehealth (INDEPENDENT_AMBULATORY_CARE_PROVIDER_SITE_OTHER): Payer: Self-pay | Admitting: General Surgery

## 2011-11-14 ENCOUNTER — Ambulatory Visit (INDEPENDENT_AMBULATORY_CARE_PROVIDER_SITE_OTHER): Payer: PRIVATE HEALTH INSURANCE | Admitting: General Surgery

## 2011-11-14 ENCOUNTER — Encounter (INDEPENDENT_AMBULATORY_CARE_PROVIDER_SITE_OTHER): Payer: Self-pay | Admitting: General Surgery

## 2011-11-14 VITALS — BP 162/84 | HR 92 | Temp 97.2°F | Resp 16 | Ht 62.0 in | Wt 180.6 lb

## 2011-11-14 DIAGNOSIS — K8020 Calculus of gallbladder without cholecystitis without obstruction: Secondary | ICD-10-CM

## 2011-11-14 DIAGNOSIS — K429 Umbilical hernia without obstruction or gangrene: Secondary | ICD-10-CM

## 2011-11-14 NOTE — Progress Notes (Signed)
Faxed request for cardiac clearance to Dr. Olga Millers contact # [phone], fax # [phone]. Included copy of today's office notes for review.

## 2011-11-14 NOTE — Patient Instructions (Signed)
Your upper abdominal and right-sided abdominal pain is probably due to your gallbladder. We have discussed different strategies for management of this.  You have decided to go ahead with cholecystectomy after you return from the beach.  You'll be scheduled for laparoscopic cholecystectomy with cholangiogram and repair of the umbilical hernia.   Laparoscopic Cholecystectomy Laparoscopic cholecystectomy is surgery to remove the gallbladder. The gallbladder is located slightly to the right of center in the abdomen, behind the liver. It is a concentrating and storage sac for the bile produced in the liver. Bile aids in the digestion and absorption of fats. Gallbladder disease (cholecystitis) is an inflammation of your gallbladder. This condition is usually caused by a buildup of gallstones (cholelithiasis) in your gallbladder. Gallstones can block the flow of bile, resulting in inflammation and pain. In severe cases, emergency surgery may be required. When emergency surgery is not required, you will have time to prepare for the procedure. Laparoscopic surgery is an alternative to open surgery. Laparoscopic surgery usually has a shorter recovery time. Your common bile duct may also need to be examined and explored. Your caregiver will discuss this with you if he or she feels this should be done. If stones are found in the common bile duct, they may be removed. LET YOUR CAREGIVER KNOW ABOUT:  Allergies to food or medicine.   Medicines taken, including vitamins, herbs, eyedrops, over-the-counter medicines, and creams.   Use of steroids (by mouth or creams).   Previous problems with anesthetics or numbing medicines.   History of bleeding problems or blood clots.   Previous surgery.   Other health problems, including diabetes and kidney problems.   Possibility of pregnancy, if this applies.  RISKS AND COMPLICATIONS All surgery is associated with risks. Some problems that may occur following this  procedure include:  Infection.   Damage to the common bile duct, nerves, arteries, veins, or other internal organs such as the stomach or intestines.   Bleeding.   A stone may remain in the common bile duct.  BEFORE THE PROCEDURE  Do not take aspirin for 3 days prior to surgery or blood thinners for 1 week prior to surgery.   Do not eat or drink anything after midnight the night before surgery.   Let your caregiver know if you develop a cold or other infectious problem prior to surgery.   You should be present 60 minutes before the procedure or as directed.  PROCEDURE  You will be given medicine that makes you sleep (general anesthetic). When you are asleep, your surgeon will make several small cuts (incisions) in your abdomen. One of these incisions is used to insert a small, lighted scope (laparoscope) into the abdomen. The laparoscope helps the surgeon see into your abdomen. Carbon dioxide gas will be pumped into your abdomen. The gas allows more room for the surgeon to perform your surgery. Other operating instruments are inserted through the other incisions. Laparoscopic procedures may not be appropriate when:  There is major scarring from previous surgery.   The gallbladder is extremely inflamed.   There are bleeding disorders or unexpected cirrhosis of the liver.   A pregnancy is near term.   Other conditions make the laparoscopic procedure impossible.  If your surgeon feels it is not safe to continue with a laparoscopic procedure, he or she will perform an open abdominal procedure. In this case, the surgeon will make an incision to open the abdomen. This gives the surgeon a larger view and field to work within.  This may allow the surgeon to perform procedures that sometimes cannot be performed with a laparoscope alone. Open surgery has a longer recovery time. AFTER THE PROCEDURE  You will be taken to the recovery area where a nurse will watch and check your progress.   You  may be allowed to go home the same day.   Do not resume physical activities until directed by your caregiver.   You may resume a normal diet and activities as directed.  Document Released: 03/04/2005 Document Revised: 02/21/2011 Document Reviewed: 08/17/2010 [facility] Patient Information 2012 [place], [place].

## 2011-11-14 NOTE — Progress Notes (Addendum)
Patient ID: Alexandria Leach, female   DOB: [birth year], 61 y.o.   MRN: [id number]  Chief Complaint  Patient presents with  . Pre-op Exam    eval abd pain/GB    HPI Alexandria Leach is a 61 y.o. female.  This patient was referred back to me by Dr. Pearson Grippe for evaluation of what appears to be symptomatic gallstones.  I saw this woman in May of this year for an umbilical hernia, but her symptoms were left lower quadrant pain and they did not seem to correlate. She saw Dr. Dorena Cookey who did not think anything further needed to be done since she had had a colonoscopy 3 years ago. The left lower quadrant pain has resolved.  She's now been having intermittent episodes of centralized and right-sided abdominal pain, no nausea vomiting or fever. No change in bowel habits.  Lab work on 10/05/2011 reveals normal liver function tests, normal CBC, normal lipase, and normal urinalysis.  Her bowel movements have not changed. She takes proton pump inhibitors chronically.  She saw Dr. Selena Batten and was reevaluated and gallbladder ultrasound on 10/21/2011 shows small gallstones. She says that she was tender in the right upper quadrant when they did the ultrasound.  She feels good today and is here for discussion of options for management.  Past history is significant for DVT, off Coumadin since January 2011;   rheumatic heart disease and mitral valve replacement, but recently had ECHO, no real cardiac symptoms,   and is followed closely by Olga Millers;    hysterectomy and BSO, hypertension, anxiety and depression, umbilical hernia, fibromyalgia, hyperlipidemia. HPI  Past Medical History  Diagnosis Date  . Depression   . HTN (hypertension)   . Rheumatic heart disease     s/p mitral valve repair 1980  . Hypercholesteremia   . Insomnia   . Anxiety   . DVT (deep venous thrombosis)   . Pain, joint, multiple sites   . Fibromyalgia   . Connective tissue disease   . Arthritis   . Heart murmur   . Mitral  stenosis   . Personal history of urinary calculi     Past Surgical History  Procedure Date  . Mitral valve repair   . Tubal ligation 1980  . Vaginal hysterectomy 1992    for fibroids -- partial    Family History  Problem Relation Age of Onset  . Lupus    . Kidney failure    . Bone cancer    . Heart disease Mother     MI in her 66s  . ALS Mother   . Kidney disease Father   . Cancer Brother     bone  . Cancer Maternal Uncle     bone    Social History History  Substance Use Topics  . Smoking status: Never Smoker   . Smokeless tobacco: Never Used  . Alcohol Use: No    Allergies  Allergen Reactions  . Statins Other (See Comments)    "Lots of cramps in back, chest, legs and foot.  . Sulfa Antibiotics Nausea And Vomiting    Current Outpatient Prescriptions  Medication Sig Dispense Refill  . amLODipine (NORVASC) 5 MG tablet Take 5 mg by mouth every morning.      Marland Kitchen aspirin 325 MG EC tablet Take 325 mg by mouth every morning.       . cholecalciferol (VITAMIN D) 1000 UNITS tablet Take 1,000 Units by mouth daily.        Marland Kitchen  Cinnamon 500 MG TABS Take 500 mg by mouth 2 (two) times daily.      . clonazePAM (KLONOPIN) 1 MG tablet Take 0.5 mg by mouth 2 (two) times daily.      . famotidine (PEPCID) 20 MG tablet Take 20 mg by mouth 2 (two) times daily.        Marland Kitchen gabapentin (NEURONTIN) 300 MG capsule Take 600 mg by mouth at bedtime.       . hydrochlorothiazide (MICROZIDE) 12.5 MG capsule Take 12.5 mg by mouth every morning.      Marland Kitchen l-methylfolate-B6-B12 (METANX) 3-35-2 MG TABS Take 1 tablet by mouth 2 (two) times daily.       . metoprolol succinate (TOPROL-XL) 50 MG 24 hr tablet Take 50 mg by mouth every morning. Take with or immediately following a meal.      . omeprazole (PRILOSEC) 20 MG capsule Take 20 mg by mouth every morning.       . potassium chloride SA (K-DUR,KLOR-CON) 20 MEQ tablet Take 20 mEq by mouth every morning.      . traMADol (ULTRAM) 50 MG tablet Take 50 mg by mouth  every 6 (six) hours as needed. For pain. Maximum dose= 8 tablets per day for pain.        Review of Systems Review of Systems  Constitutional: Negative for fever, chills and unexpected weight change.  HENT: Negative for hearing loss, congestion, sore throat, trouble swallowing and voice change.   Eyes: Negative for visual disturbance.  Respiratory: Negative for cough and wheezing.   Cardiovascular: Positive for leg swelling. Negative for chest pain and palpitations.  Gastrointestinal: Positive for abdominal pain. Negative for nausea, vomiting, diarrhea, constipation, blood in stool, abdominal distention and anal bleeding.  Genitourinary: Negative for hematuria, vaginal bleeding and difficulty urinating.  Musculoskeletal: Positive for myalgias and arthralgias.  Skin: Negative for rash and wound.  Neurological: Negative for seizures, syncope and headaches.  Hematological: Negative for adenopathy. Does not bruise/bleed easily.  Psychiatric/Behavioral: Negative for confusion.    Blood pressure 162/84, pulse 92, temperature 97.2 F (36.2 C), temperature source Temporal, resp. rate 16, height 5\' 2"  (1.575 m), weight 180 lb 9.6 oz (81.92 kg).  Physical Exam Physical Exam  Constitutional: She is oriented to person, place, and time. She appears well-developed and well-nourished. No distress.  HENT:  Head: Normocephalic and atraumatic.  Nose: Nose normal.  Mouth/Throat: No oropharyngeal exudate.  Eyes: Conjunctivae and EOM are normal. Pupils are equal, round, and reactive to light. Left eye exhibits no discharge. No scleral icterus.  Neck: Neck supple. No JVD present. No tracheal deviation present. No thyromegaly present.  Cardiovascular: Normal rate, regular rhythm and intact distal pulses.   Murmur heard.      Grade 2 systolic murmur. Well-healed sternotomy scar.  Pulmonary/Chest: Effort normal and breath sounds normal. No respiratory distress. She has no wheezes. She has no rales. She  exhibits no tenderness.  Abdominal: Soft. Bowel sounds are normal. She exhibits mass. She exhibits no distension. There is tenderness. There is no rebound and no guarding.       Upper abdominal chest tube site scars well healed. Transverse incision below umbilicus. Small umbilical hernia, tender, not completely reducible. Well-healed Pfannenstiel scar. Tender to percuss both right and left costal margins. Not distended  Musculoskeletal: She exhibits no edema and no tenderness.  Lymphadenopathy:    She has no cervical adenopathy.  Neurological: She is alert and oriented to person, place, and time. She exhibits normal muscle tone. Coordination normal.  Skin: Skin is warm. No rash noted. She is not diaphoretic. No erythema. No pallor.  Psychiatric: She has a normal mood and affect. Her behavior is normal. Judgment and thought content normal.    Data Reviewed Ultrasound report. My old records. Office note from Dr. Pearson Grippe.  Assessment    Chronic cholecystitis with cholelithiasis. Her episodes of upper abdominal and right-sided abdominal pain or likely due to her gallbladder.  GERD, on proton pump inhibitors chronically  Hypertension  Anxiety and depression  History mitral valve replacement; rheumatic heart disease.  History abdominal hysterectomy and BSO                                                                    history DVT, off Coumadin  since 2011   Incarcerated umbilical hernia     Fibromyalgia    Plan    After lengthy discussion, the patient decided she wanted to go ahead with cholecystectomy and repair of her umbilical hernia.  We will schedule for laparoscopic cholecystectomy with cholangiogram and repair of incarcerated umbilical hernia in September after she returns from the beach.  We will ask Dr. Olga Millers for cardiac clearance. ADDENDUM(11/15/2011) :   Dr. Jens Som sent message that patient is OK for surgery.  I have discussed the indications, details,  techniques, and numerous risk with the patient in great detail. She understands these issues. Questions are answered. She agrees with this plan.       Angelia Mould. Derrell Lolling, M.D., [facility] General and Minimally invasive Surgery Breast and Colorectal Surgery Office:   [phone] Pager:   [phone]  11/14/2011, 12:31 PM

## 2011-11-22 NOTE — Pre-Procedure Instructions (Addendum)
20 Alexandria Leach  11/22/2011   Your procedure is scheduled on: 11/27/11  Report to [facility] at 900 AM.  Call this number if you have problems the morning of surgery: [phone]   Remember:   Do not eat food or drink:After Midnight.  .  Take these medicines the morning of surgery with A SIP OF WATER: amlodipine, clonazepam, pepcid, metoprolol, prilosec, pain med  STOP over the counter meds and aspirin now   Do not wear jewelry, make-up or nail polish.  Do not wear lotions, powders, or perfumes. You may wear deodorant.  Do not shave 48 hours prior to surgery. Men may shave face and neck.  Do not bring valuables to the hospital.  Contacts, dentures or bridgework may not be worn into surgery.  Leave suitcase in the car. After surgery it may be brought to your room.  For patients admitted to the hospital, checkout time is 11:00 AM the day of discharge.   Patients discharged the day of surgery will not be allowed to drive home.  Name and phone number of your driver: rodney son [phone]  Special Instructions: CHG Shower Use Special Wash: 1/2 bottle night before surgery and 1/2 bottle morning of surgery.   Please read over the following fact sheets that you were given: Pain Booklet, Coughing and Deep Breathing, MRSA Information and Surgical Site Infection Prevention

## 2011-11-24 ENCOUNTER — Encounter (HOSPITAL_COMMUNITY): Payer: Self-pay | Admitting: Pharmacy Technician

## 2011-11-25 ENCOUNTER — Encounter (HOSPITAL_COMMUNITY)
Admission: RE | Admit: 2011-11-25 | Discharge: 2011-11-25 | Disposition: A | Discharge status: Home / Self Care | Payer: PRIVATE HEALTH INSURANCE | Source: Ambulatory Visit | Attending: General Surgery | Admitting: General Surgery

## 2011-11-25 ENCOUNTER — Encounter (HOSPITAL_COMMUNITY): Payer: Self-pay

## 2011-11-25 HISTORY — DX: Other specified postprocedural states: R11.2

## 2011-11-25 HISTORY — DX: Other specified postprocedural states: Z98.890

## 2011-11-25 LAB — COMPREHENSIVE METABOLIC PANEL
ALT: 18 U/L (ref 0–35)
AST: 36 U/L (ref 0–37)
Albumin: 4 g/dL (ref 3.5–5.2)
Alkaline Phosphatase: 60 U/L (ref 39–117)
CO2: 28 mEq/L (ref 19–32)
Chloride: 101 mEq/L (ref 96–112)
Creatinine, Ser: 1.04 mg/dL (ref 0.50–1.10)
GFR calc non Af Amer: 58 mL/min — ABNORMAL LOW (ref >90)
Potassium: 3.8 mEq/L (ref 3.5–5.1)
Sodium: 138 mEq/L (ref 135–145)
Total Bilirubin: 0.4 mg/dL (ref 0.3–1.2)

## 2011-11-25 LAB — CBC WITH DIFFERENTIAL/PLATELET
Basophils Absolute: 0 10*3/uL (ref 0.0–0.1)
Basophils Relative: 0 % (ref 0–1)
HCT: 39.2 % (ref 36.0–46.0)
Lymphocytes Relative: 41 % (ref 12–46)
MCHC: 32.9 g/dL (ref 30.0–36.0)
Neutro Abs: 2.5 10*3/uL (ref 1.7–7.7)
Neutrophils Relative %: 46 % (ref 43–77)
Platelets: 275 10*3/uL (ref 150–400)
RDW: 14.3 % (ref 11.5–15.5)
WBC: 5.4 10*3/uL (ref 4.0–10.5)

## 2011-11-25 LAB — URINALYSIS, ROUTINE W REFLEX MICROSCOPIC
Bilirubin Urine: NEGATIVE
Glucose, UA: NEGATIVE mg/dL
Hgb urine dipstick: NEGATIVE
Protein, ur: NEGATIVE mg/dL

## 2011-11-25 LAB — PROTIME-INR
INR: 1 (ref 0.00–1.49)
Prothrombin Time: 13.4 seconds (ref 11.6–15.2)

## 2011-11-25 LAB — SURGICAL PCR SCREEN
MRSA, PCR: NEGATIVE
Staphylococcus aureus: NEGATIVE

## 2011-11-25 NOTE — Progress Notes (Addendum)
Clearance from dr Jens Som  Included in dr Aura Camps note  11/14/11 ekg 03/16/11. Echo 7/13. crenshaw note 2/13 in epic Patient Alexandria Leach witness  Form faxed to blood bank

## 2011-11-26 MED ORDER — CEFAZOLIN SODIUM-DEXTROSE 2-3 GM-% IV SOLR
2.0000 g | INTRAVENOUS | Status: AC
  Administered 2011-11-27: 2 g via INTRAVENOUS
  Filled 2011-11-26: qty 50

## 2011-11-26 MED ORDER — CHLORHEXIDINE GLUCONATE 4 % EX LIQD: 1.0000 "application " | Freq: Once | CUTANEOUS | Status: DC

## 2011-11-26 MED ORDER — HEPARIN SODIUM (PORCINE) 5000 UNIT/ML IJ SOLN
5000.0000 [IU] | Freq: Once | INTRAMUSCULAR | Status: AC
  Administered 2011-11-27: 5000 [IU] via SUBCUTANEOUS
  Filled 2011-11-26: qty 1

## 2011-11-26 NOTE — H&P (Signed)
Alexandria Leach     MRN: [id number]   Description: 61 year old female  Provider: Ernestene Mention, MD  Department: [facility]       Diagnoses     Gallstones   - Primary    574.20    Umbilical hernia     553.1       Vitals -      BP Pulse Temp Resp Ht Wt    162/84 92 97.2 F (36.2 C) (Temporal) 16 5\' 2"  (1.575 m) 180 lb 9.6 oz (81.92 kg)   BMI - 33.03 kg/m2                 History and Physical   Ernestene Mention, MD   Patient ID: Alexandria Leach, female   DOB: Aug 09, 1950, 61 y.o.   MRN: [id number]                 HPI Alexandria Leach is a 61 y.o. female.  This patient was referred back to me by Dr. Pearson Grippe for evaluation of what appears to be symptomatic gallstones.   I saw this woman in May of this year for an umbilical hernia, but her symptoms were left lower quadrant pain and they did not seem to correlate. She saw Dr. Dorena Cookey who did not think anything further needed to be done since she had had a colonoscopy 3 years ago. The left lower quadrant pain has resolved.   She's now been having intermittent episodes of centralized and right-sided abdominal pain, no nausea vomiting or fever. No change in bowel habits.  Lab work on 10/05/2011 reveals normal liver function tests, normal CBC, normal lipase, and normal urinalysis.   Her bowel movements have not changed. She takes proton pump inhibitors chronically.   She saw Dr. Selena Batten and was reevaluated and gallbladder ultrasound on 10/21/2011 shows small gallstones. She says that she was tender in the right upper quadrant when they did the ultrasound.   She feels good today and is here for discussion of options for management.   Past history is significant for DVT, off Coumadin since January 2011;   rheumatic heart disease and mitral valve replacement, but recently had ECHO, no real cardiac symptoms,   and is followed closely by Olga Millers;    hysterectomy and BSO, hypertension, anxiety and  depression, umbilical hernia, fibromyalgia, hyperlipidemia.     Past Medical History   Diagnosis  Date   .  Depression     .  HTN (hypertension)     .  Rheumatic heart disease         s/p mitral valve repair 1980   .  Hypercholesteremia     .  Insomnia     .  Anxiety     .  DVT (deep venous thrombosis)     .  Pain, joint, multiple sites     .  Fibromyalgia     .  Connective tissue disease     .  Arthritis     .  Heart murmur     .  Mitral stenosis     .  Personal history of urinary calculi         Past Surgical History   Procedure  Date   .  Mitral valve repair     .  Tubal ligation  1980   .  Vaginal hysterectomy  1992       for fibroids -- partial  Family History   Problem  Relation  Age of Onset   .  Lupus       .  Kidney failure       .  Bone cancer       .  Heart disease  Mother         MI in her 23s   .  ALS  Mother     .  Kidney disease  Father     .  Cancer  Brother         bone   .  Cancer  Maternal Uncle         bone      Social History History   Substance Use Topics   .  Smoking status:  Never Smoker    .  Smokeless tobacco:  Never Used   .  Alcohol Use:  No       Allergies   Allergen  Reactions   .  Statins  Other (See Comments)       "Lots of cramps in back, chest, legs and foot.   .  Sulfa Antibiotics  Nausea And Vomiting       Current Outpatient Prescriptions   Medication  Sig  Dispense  Refill   .  amLODipine (NORVASC) 5 MG tablet  Take 5 mg by mouth every morning.         Marland Kitchen  aspirin 325 MG EC tablet  Take 325 mg by mouth every morning.          .  cholecalciferol (VITAMIN D) 1000 UNITS tablet  Take 1,000 Units by mouth daily.           .  Cinnamon 500 MG TABS  Take 500 mg by mouth 2 (two) times daily.         .  clonazePAM (KLONOPIN) 1 MG tablet  Take 0.5 mg by mouth 2 (two) times daily.         .  famotidine (PEPCID) 20 MG tablet  Take 20 mg by mouth 2 (two) times daily.           Marland Kitchen  gabapentin (NEURONTIN) 300 MG capsule   Take 600 mg by mouth at bedtime.          .  hydrochlorothiazide (MICROZIDE) 12.5 MG capsule  Take 12.5 mg by mouth every morning.         Marland Kitchen  l-methylfolate-B6-B12 (METANX) 3-35-2 MG TABS  Take 1 tablet by mouth 2 (two) times daily.          .  metoprolol succinate (TOPROL-XL) 50 MG 24 hr tablet  Take 50 mg by mouth every morning. Take with or immediately following a meal.         .  omeprazole (PRILOSEC) 20 MG capsule  Take 20 mg by mouth every morning.          .  potassium chloride SA (K-DUR,KLOR-CON) 20 MEQ tablet  Take 20 mEq by mouth every morning.         .  traMADol (ULTRAM) 50 MG tablet  Take 50 mg by mouth every 6 (six) hours as needed. For pain. Maximum dose= 8 tablets per day for pain.            Review of Systems   Constitutional: Negative for fever, chills and unexpected weight change.  HENT: Negative for hearing loss, congestion, sore throat, trouble swallowing and voice change.   Eyes: Negative for visual disturbance.  Respiratory: Negative  for cough and wheezing.   Cardiovascular: Positive for leg swelling. Negative for chest pain and palpitations.  Gastrointestinal: Positive for abdominal pain. Negative for nausea, vomiting, diarrhea, constipation, blood in stool, abdominal distention and anal bleeding.  Genitourinary: Negative for hematuria, vaginal bleeding and difficulty urinating.  Musculoskeletal: Positive for myalgias and arthralgias.  Skin: Negative for rash and wound.  Neurological: Negative for seizures, syncope and headaches.  Hematological: Negative for adenopathy. Does not bruise/bleed easily.  Psychiatric/Behavioral: Negative for confusion.    Blood pressure 162/84, pulse 92, temperature 97.2 F (36.2 C), temperature source Temporal, resp. rate 16, height 5\' 2"  (1.575 m), weight 180 lb 9.6 oz (81.92 kg).   Physical Exam Constitutional: She is oriented to person, place, and time. She appears well-developed and well-nourished. No distress.  HENT:   Head:  Normocephalic and atraumatic.   Nose: Nose normal.   Mouth/Throat: No oropharyngeal exudate.  Eyes: Conjunctivae and EOM are normal. Pupils are equal, round, and reactive to light. Left eye exhibits no discharge. No scleral icterus.  Neck: Neck supple. No JVD present. No tracheal deviation present. No thyromegaly present.  Cardiovascular: Normal rate, regular rhythm and intact distal pulses.    Murmur heard.      Grade 2 systolic murmur. Well-healed sternotomy scar.  Pulmonary/Chest: Effort normal and breath sounds normal. No respiratory distress. She has no wheezes. She has no rales. She exhibits no tenderness.  Abdominal: Soft. Bowel sounds are normal. She exhibits mass. She exhibits no distension. There is tenderness. There is no rebound and no guarding.       Upper abdominal chest tube site scars well healed. Transverse incision below umbilicus. Small umbilical hernia, tender, not completely reducible. Well-healed Pfannenstiel scar. Tender to percuss both right and left costal margins. Not distended  Musculoskeletal: She exhibits no edema and no tenderness.  Lymphadenopathy:    She has no cervical adenopathy.  Neurological: She is alert and oriented to person, place, and time. She exhibits normal muscle tone. Coordination normal.  Skin: Skin is warm. No rash noted. She is not diaphoretic. No erythema. No pallor.  Psychiatric: She has a normal mood and affect. Her behavior is normal. Judgment and thought content normal.    Data Reviewed Ultrasound report. My old records. Office note from Dr. Pearson Grippe.   Assessment Chronic cholecystitis with cholelithiasis. Her episodes of upper abdominal and right-sided abdominal pain or likely due to her gallbladder.   GERD, on proton pump inhibitors chronically   Hypertension   Anxiety and depression   History mitral valve replacement; rheumatic heart disease.   History abdominal hysterectomy and BSO  history DVT, off Coumadin  since 2011    Incarcerated umbilical hernia                                      Fibromyalgia   Plan After lengthy discussion, the patient decided she wanted to go ahead with cholecystectomy and repair of her umbilical hernia.   We will schedule for laparoscopic cholecystectomy with cholangiogram and repair of incarcerated umbilical hernia in September after she returns from the beach.   We will ask Dr. Olga Millers for cardiac clearance. ADDENDUM(11/15/2011) :   Dr. Jens Som sent message that patient is OK for surgery.   I have discussed the indications, details, techniques, and numerous risk with the patient in great detail. She understands these issues. Questions are answered. She agrees with this plan.       Angelia Mould. Derrell Lolling, M.D., [facility] General and Minimally invasive Surgery Breast and Colorectal Surgery Office:   [phone] Pager:   [phone]

## 2011-11-27 ENCOUNTER — Encounter (INDEPENDENT_AMBULATORY_CARE_PROVIDER_SITE_OTHER): Payer: Self-pay | Admitting: General Surgery

## 2011-11-27 ENCOUNTER — Ambulatory Visit (HOSPITAL_COMMUNITY): Payer: PRIVATE HEALTH INSURANCE

## 2011-11-27 ENCOUNTER — Encounter (HOSPITAL_COMMUNITY): Payer: Self-pay | Admitting: Anesthesiology

## 2011-11-27 ENCOUNTER — Ambulatory Visit (HOSPITAL_COMMUNITY)
Admission: RE | Admit: 2011-11-27 | Discharge: 2011-11-28 | Disposition: A | Discharge status: Home / Self Care | Payer: PRIVATE HEALTH INSURANCE | Source: Ambulatory Visit | Attending: General Surgery | Admitting: General Surgery

## 2011-11-27 ENCOUNTER — Encounter (HOSPITAL_COMMUNITY)
Admission: RE | Disposition: A | Discharge status: Home / Self Care | Payer: Self-pay | Source: Ambulatory Visit | Attending: General Surgery

## 2011-11-27 ENCOUNTER — Ambulatory Visit (HOSPITAL_COMMUNITY): Payer: PRIVATE HEALTH INSURANCE | Admitting: Anesthesiology

## 2011-11-27 ENCOUNTER — Encounter (HOSPITAL_COMMUNITY): Payer: Self-pay | Admitting: General Practice

## 2011-11-27 DIAGNOSIS — K8019 Calculus of gallbladder with other cholecystitis with obstruction: Secondary | ICD-10-CM | POA: Diagnosis present

## 2011-11-27 DIAGNOSIS — K8010 Calculus of gallbladder with chronic cholecystitis without obstruction: Secondary | ICD-10-CM | POA: Insufficient documentation

## 2011-11-27 DIAGNOSIS — Z01818 Encounter for other preprocedural examination: Secondary | ICD-10-CM | POA: Insufficient documentation

## 2011-11-27 DIAGNOSIS — K430 Incisional hernia with obstruction, without gangrene: Secondary | ICD-10-CM | POA: Insufficient documentation

## 2011-11-27 DIAGNOSIS — Z01812 Encounter for preprocedural laboratory examination: Secondary | ICD-10-CM | POA: Insufficient documentation

## 2011-11-27 HISTORY — DX: Reserved for inherently not codable concepts without codable children: IMO0001

## 2011-11-27 HISTORY — PX: UMBILICAL HERNIA REPAIR: SHX196

## 2011-11-27 HISTORY — PX: CHOLECYSTECTOMY: SHX55

## 2011-11-27 HISTORY — DX: Procedure and treatment not carried out because of patient's decision for reasons of belief and group pressure: Z53.1

## 2011-11-27 LAB — NO BLOOD PRODUCTS

## 2011-11-27 SURGERY — LAPAROSCOPIC CHOLECYSTECTOMY WITH INTRAOPERATIVE CHOLANGIOGRAM
Anesthesia: General | Site: Abdomen | Wound class: Clean Contaminated

## 2011-11-27 MED ORDER — HYDROMORPHONE HCL PF 1 MG/ML IJ SOLN
0.2500 mg | INTRAMUSCULAR | Status: DC | PRN
  Administered 2011-11-27 (×3): 0.5 mg via INTRAVENOUS

## 2011-11-27 MED ORDER — GLYCOPYRROLATE 0.2 MG/ML IJ SOLN
INTRAMUSCULAR | Status: DC | PRN
  Administered 2011-11-27: 0.4 mg via INTRAVENOUS

## 2011-11-27 MED ORDER — HYDROMORPHONE HCL PF 1 MG/ML IJ SOLN
INTRAMUSCULAR | Status: AC
  Administered 2011-11-27: 0.5 mg via INTRAVENOUS
  Filled 2011-11-27: qty 1

## 2011-11-27 MED ORDER — HYDROCHLOROTHIAZIDE 12.5 MG PO CAPS
12.5000 mg | ORAL_CAPSULE | Freq: Every morning | ORAL | Status: DC
  Administered 2011-11-27: 12.5 mg via ORAL
  Filled 2011-11-27 (×2): qty 1

## 2011-11-27 MED ORDER — LACTATED RINGERS IV SOLN
INTRAVENOUS | Status: DC | PRN
  Administered 2011-11-27 (×2): via INTRAVENOUS

## 2011-11-27 MED ORDER — HEPARIN SODIUM (PORCINE) 5000 UNIT/ML IJ SOLN
5000.0000 [IU] | Freq: Three times a day (TID) | INTRAMUSCULAR | Status: DC
  Administered 2011-11-27 – 2011-11-28 (×3): 5000 [IU] via SUBCUTANEOUS
  Filled 2011-11-27 (×6): qty 1

## 2011-11-27 MED ORDER — SODIUM CHLORIDE 0.9 % IR SOLN
Status: DC | PRN
  Administered 2011-11-27: 1000 mL

## 2011-11-27 MED ORDER — PROPOFOL 10 MG/ML IV BOLUS
INTRAVENOUS | Status: DC | PRN
  Administered 2011-11-27: 200 mg via INTRAVENOUS

## 2011-11-27 MED ORDER — METOPROLOL SUCCINATE ER 50 MG PO TB24
50.0000 mg | ORAL_TABLET | Freq: Every morning | ORAL | Status: DC
  Filled 2011-11-27: qty 1

## 2011-11-27 MED ORDER — FAMOTIDINE 20 MG PO TABS
20.0000 mg | ORAL_TABLET | Freq: Two times a day (BID) | ORAL | Status: DC
  Administered 2011-11-27 (×2): 20 mg via ORAL
  Filled 2011-11-27 (×4): qty 1

## 2011-11-27 MED ORDER — GABAPENTIN 300 MG PO CAPS
600.0000 mg | ORAL_CAPSULE | Freq: Three times a day (TID) | ORAL | Status: DC
  Administered 2011-11-27 (×2): 600 mg via ORAL
  Filled 2011-11-27 (×5): qty 2

## 2011-11-27 MED ORDER — ONDANSETRON HCL 4 MG PO TABS: 4.0000 mg | ORAL_TABLET | Freq: Four times a day (QID) | ORAL | Status: DC | PRN

## 2011-11-27 MED ORDER — ONDANSETRON HCL 4 MG/2ML IJ SOLN
4.0000 mg | Freq: Four times a day (QID) | INTRAMUSCULAR | Status: DC | PRN
  Administered 2011-11-27: 4 mg via INTRAVENOUS
  Filled 2011-11-27 (×2): qty 2

## 2011-11-27 MED ORDER — CLONAZEPAM 0.5 MG PO TABS
0.5000 mg | ORAL_TABLET | Freq: Two times a day (BID) | ORAL | Status: DC
  Administered 2011-11-27 (×2): 0.5 mg via ORAL
  Filled 2011-11-27 (×3): qty 1

## 2011-11-27 MED ORDER — HYDROMORPHONE HCL PF 1 MG/ML IJ SOLN
INTRAMUSCULAR | Status: AC
  Filled 2011-11-27: qty 1

## 2011-11-27 MED ORDER — BUPIVACAINE-EPINEPHRINE 0.25% -1:200000 IJ SOLN
INTRAMUSCULAR | Status: DC | PRN
  Administered 2011-11-27: 30 mL

## 2011-11-27 MED ORDER — LACTATED RINGERS IV SOLN
INTRAVENOUS | Status: DC
  Administered 2011-11-27: 10:00:00 via INTRAVENOUS

## 2011-11-27 MED ORDER — SODIUM CHLORIDE 0.9 % IV SOLN
INTRAVENOUS | Status: DC | PRN
  Administered 2011-11-27: 11:00:00

## 2011-11-27 MED ORDER — LIDOCAINE HCL (CARDIAC) 20 MG/ML IV SOLN
INTRAVENOUS | Status: DC | PRN
  Administered 2011-11-27 (×2): 50 mg via INTRAVENOUS

## 2011-11-27 MED ORDER — VITAMIN D3 25 MCG (1000 UNIT) PO TABS
1000.0000 [IU] | ORAL_TABLET | Freq: Two times a day (BID) | ORAL | Status: DC
  Administered 2011-11-27 (×2): 1000 [IU] via ORAL
  Filled 2011-11-27 (×4): qty 1

## 2011-11-27 MED ORDER — NEOSTIGMINE METHYLSULFATE 1 MG/ML IJ SOLN
INTRAMUSCULAR | Status: DC | PRN
  Administered 2011-11-27: 3 mg via INTRAVENOUS

## 2011-11-27 MED ORDER — ASPIRIN EC 325 MG PO TBEC: 325.0000 mg | DELAYED_RELEASE_TABLET | Freq: Every morning | ORAL | Status: DC

## 2011-11-27 MED ORDER — CEFAZOLIN SODIUM-DEXTROSE 2-3 GM-% IV SOLR
2.0000 g | Freq: Three times a day (TID) | INTRAVENOUS | Status: AC
  Administered 2011-11-27 – 2011-11-28 (×3): 2 g via INTRAVENOUS
  Filled 2011-11-27 (×4): qty 50

## 2011-11-27 MED ORDER — ONDANSETRON HCL 4 MG/2ML IJ SOLN: 4.0000 mg | Freq: Once | INTRAMUSCULAR | Status: DC | PRN

## 2011-11-27 MED ORDER — ONDANSETRON HCL 4 MG/2ML IJ SOLN
INTRAMUSCULAR | Status: DC | PRN
  Administered 2011-11-27: 4 mg via INTRAVENOUS

## 2011-11-27 MED ORDER — FENTANYL CITRATE 0.05 MG/ML IJ SOLN
INTRAMUSCULAR | Status: DC | PRN
  Administered 2011-11-27: 100 ug via INTRAVENOUS
  Administered 2011-11-27 (×3): 50 ug via INTRAVENOUS

## 2011-11-27 MED ORDER — L-METHYLFOLATE-B6-B12 3-35-2 MG PO TABS
1.0000 | ORAL_TABLET | Freq: Two times a day (BID) | ORAL | Status: DC
  Administered 2011-11-27 (×2): 1 via ORAL
  Filled 2011-11-27 (×5): qty 1

## 2011-11-27 MED ORDER — CEFAZOLIN SODIUM 1-5 GM-% IV SOLN
INTRAVENOUS | Status: AC
  Filled 2011-11-27: qty 100

## 2011-11-27 MED ORDER — ASPIRIN EC 325 MG PO TBEC
325.0000 mg | DELAYED_RELEASE_TABLET | Freq: Every day | ORAL | Status: DC
  Administered 2011-11-27: 325 mg via ORAL
  Filled 2011-11-27 (×2): qty 1

## 2011-11-27 MED ORDER — PANTOPRAZOLE SODIUM 40 MG PO TBEC
40.0000 mg | DELAYED_RELEASE_TABLET | Freq: Every day | ORAL | Status: DC
  Administered 2011-11-27: 40 mg via ORAL
  Filled 2011-11-27: qty 1

## 2011-11-27 MED ORDER — BUPIVACAINE-EPINEPHRINE PF 0.25-1:200000 % IJ SOLN
INTRAMUSCULAR | Status: AC
  Filled 2011-11-27: qty 30

## 2011-11-27 MED ORDER — MORPHINE SULFATE 2 MG/ML IJ SOLN
2.0000 mg | INTRAMUSCULAR | Status: DC | PRN
  Administered 2011-11-27 – 2011-11-28 (×4): 2 mg via INTRAVENOUS
  Filled 2011-11-27 (×4): qty 1

## 2011-11-27 MED ORDER — ROCURONIUM BROMIDE 100 MG/10ML IV SOLN
INTRAVENOUS | Status: DC | PRN
  Administered 2011-11-27: 30 mg via INTRAVENOUS

## 2011-11-27 MED ORDER — AMLODIPINE BESYLATE 5 MG PO TABS
5.0000 mg | ORAL_TABLET | Freq: Every morning | ORAL | Status: DC
  Filled 2011-11-27: qty 1

## 2011-11-27 MED ORDER — POTASSIUM CHLORIDE CRYS ER 20 MEQ PO TBCR
20.0000 meq | EXTENDED_RELEASE_TABLET | Freq: Every morning | ORAL | Status: DC
  Administered 2011-11-27: 20 meq via ORAL
  Filled 2011-11-27 (×2): qty 1

## 2011-11-27 MED ORDER — POTASSIUM CHLORIDE IN NACL 20-0.9 MEQ/L-% IV SOLN
INTRAVENOUS | Status: DC
  Administered 2011-11-27 – 2011-11-28 (×2): via INTRAVENOUS
  Filled 2011-11-27 (×3): qty 1000

## 2011-11-27 MED ORDER — HYDROCODONE-ACETAMINOPHEN 5-325 MG PO TABS
1.0000 | ORAL_TABLET | ORAL | Status: DC | PRN
  Administered 2011-11-27 – 2011-11-28 (×2): 2 via ORAL
  Filled 2011-11-27 (×2): qty 2

## 2011-11-27 SURGICAL SUPPLY — 62 items
ADH SKN CLS APL DERMABOND .7 (GAUZE/BANDAGES/DRESSINGS) ×1
APL SKNCLS STERI-STRIP NONHPOA (GAUZE/BANDAGES/DRESSINGS)
APPLIER CLIP ROT 10 11.4 M/L (STAPLE)
APR CLP MED LRG 11.4X10 (STAPLE)
BAG SPEC RTRVL LRG 6X4 10 (ENDOMECHANICALS) ×1
BENZOIN TINCTURE PRP APPL 2/3 (GAUZE/BANDAGES/DRESSINGS) ×1 IMPLANT
BLADE SURG 10 STRL SS (BLADE) ×2 IMPLANT
BLADE SURG 15 STRL LF DISP TIS (BLADE) ×1 IMPLANT
BLADE SURG 15 STRL SS (BLADE) ×2
BLADE SURG ROTATE 9660 (MISCELLANEOUS) IMPLANT
CANISTER SUCTION 2500CC (MISCELLANEOUS) ×2 IMPLANT
CHLORAPREP W/TINT 26ML (MISCELLANEOUS) ×2 IMPLANT
CLIP APPLIE ROT 10 11.4 M/L (STAPLE) ×1 IMPLANT
CLOTH BEACON ORANGE TIMEOUT ST (SAFETY) ×2 IMPLANT
COVER MAYO STAND STRL (DRAPES) ×2 IMPLANT
COVER SURGICAL LIGHT HANDLE (MISCELLANEOUS) ×2 IMPLANT
DECANTER SPIKE VIAL GLASS SM (MISCELLANEOUS) ×2 IMPLANT
DERMABOND ADVANCED (GAUZE/BANDAGES/DRESSINGS) ×1
DERMABOND ADVANCED .7 DNX12 (GAUZE/BANDAGES/DRESSINGS) ×1 IMPLANT
DRAPE C-ARM 42X72 X-RAY (DRAPES) ×2 IMPLANT
DRAPE LAPAROTOMY T 102X78X121 (DRAPES) ×2 IMPLANT
DRAPE UTILITY 15X26 W/TAPE STR (DRAPE) ×4 IMPLANT
ELECT CAUTERY BLADE 6.4 (BLADE) ×2 IMPLANT
ELECT REM PT RETURN 9FT ADLT (ELECTROSURGICAL) ×2
ELECTRODE REM PT RTRN 9FT ADLT (ELECTROSURGICAL) ×1 IMPLANT
GLOVE EUDERMIC 7 POWDERFREE (GLOVE) ×3 IMPLANT
GOWN PREVENTION PLUS XLARGE (GOWN DISPOSABLE) ×2 IMPLANT
GOWN STRL NON-REIN LRG LVL3 (GOWN DISPOSABLE) ×6 IMPLANT
KIT BASIN OR (CUSTOM PROCEDURE TRAY) ×2 IMPLANT
KIT ROOM TURNOVER OR (KITS) ×2 IMPLANT
NDL HYPO 25GX1X1/2 BEV (NEEDLE) ×1 IMPLANT
NEEDLE HYPO 25GX1X1/2 BEV (NEEDLE) ×2 IMPLANT
NS IRRIG 1000ML POUR BTL (IV SOLUTION) ×2 IMPLANT
PACK SURGICAL SETUP 50X90 (CUSTOM PROCEDURE TRAY) ×2 IMPLANT
PAD ARMBOARD 7.5X6 YLW CONV (MISCELLANEOUS) ×4 IMPLANT
PENCIL BUTTON HOLSTER BLD 10FT (ELECTRODE) ×2 IMPLANT
POUCH SPECIMEN RETRIEVAL 10MM (ENDOMECHANICALS) ×2 IMPLANT
SCISSORS LAP 5X35 DISP (ENDOMECHANICALS) IMPLANT
SET CHOLANGIOGRAPH 5 50 .035 (SET/KITS/TRAYS/PACK) ×2 IMPLANT
SET IRRIG TUBING LAPAROSCOPIC (IRRIGATION / IRRIGATOR) ×2 IMPLANT
SLEEVE ENDOPATH XCEL 5M (ENDOMECHANICALS) ×2 IMPLANT
SPECIMEN JAR SMALL (MISCELLANEOUS) ×2 IMPLANT
SPONGE GAUZE 4X4 12PLY (GAUZE/BANDAGES/DRESSINGS) ×2 IMPLANT
SPONGE LAP 18X18 X RAY DECT (DISPOSABLE) IMPLANT
SPONGE LAP 4X18 X RAY DECT (DISPOSABLE) ×2 IMPLANT
STRIP CLOSURE SKIN 1/2X4 (GAUZE/BANDAGES/DRESSINGS) ×2 IMPLANT
SUT MNCRL AB 4-0 PS2 18 (SUTURE) ×3 IMPLANT
SUT NOVA NAB DX-16 0-1 5-0 T12 (SUTURE) ×1 IMPLANT
SUT PROLENE 0 CT 1 30 (SUTURE) IMPLANT
SUT SILK 3 0 SH CR/8 (SUTURE) IMPLANT
SUT VIC AB 3-0 SH 27 (SUTURE) ×2
SUT VIC AB 3-0 SH 27XBRD (SUTURE) IMPLANT
SYR CONTROL 10ML LL (SYRINGE) ×2 IMPLANT
TOWEL OR 17X24 6PK STRL BLUE (TOWEL DISPOSABLE) ×2 IMPLANT
TOWEL OR 17X26 10 PK STRL BLUE (TOWEL DISPOSABLE) ×2 IMPLANT
TRAY LAPAROSCOPIC (CUSTOM PROCEDURE TRAY) ×2 IMPLANT
TROCAR XCEL BLUNT TIP 100MML (ENDOMECHANICALS) ×2 IMPLANT
TROCAR XCEL NON-BLD 11X100MML (ENDOMECHANICALS) ×2 IMPLANT
TROCAR XCEL NON-BLD 5MMX100MML (ENDOMECHANICALS) ×2 IMPLANT
TUBE CONNECTING 12X1/4 (SUCTIONS) IMPLANT
WATER STERILE IRR 1000ML POUR (IV SOLUTION) IMPLANT
YANKAUER SUCT BULB TIP NO VENT (SUCTIONS) IMPLANT

## 2011-11-27 NOTE — Anesthesia Preprocedure Evaluation (Signed)
Anesthesia Evaluation  Patient identified by MRN, date of birth, ID band Patient awake    Reviewed: Allergy & Precautions, H&P , NPO status , Patient's Chart, lab work & pertinent test results  Airway Mallampati: I TM Distance: >3 FB Neck ROM: full    Dental   Pulmonary          Cardiovascular hypertension, + Valvular Problems/Murmurs Rhythm:regular Rate:Normal     Neuro/Psych  Neuromuscular disease    GI/Hepatic   Endo/Other    Renal/GU      Musculoskeletal   Abdominal   Peds  Hematology   Anesthesia Other Findings   Reproductive/Obstetrics                           Anesthesia Physical Anesthesia Plan  ASA: II  Anesthesia Plan: General   Post-op Pain Management:    Induction: Intravenous  Airway Management Planned: Oral ETT  Additional Equipment:   Intra-op Plan:   Post-operative Plan: Extubation in OR  Informed Consent: I have reviewed the patients History and Physical, chart, labs and discussed the procedure including the risks, benefits and alternatives for the proposed anesthesia with the patient or authorized representative who has indicated his/her understanding and acceptance.     Plan Discussed with: CRNA, Anesthesiologist and Surgeon  Anesthesia Plan Comments:         Anesthesia Quick Evaluation

## 2011-11-27 NOTE — Progress Notes (Signed)
Received cardiac clearance from surgery from Dr. Jens Som. Information sent to medical records to be scanned into the chart.

## 2011-11-27 NOTE — Op Note (Signed)
Patient Name:           Alexandria Leach   Date of Surgery:        11/27/2011  Pre op Diagnosis:      Chronic cholecystitis with cholelithiasis, incarcerated incisional hernia  Post op Diagnosis:    same  Procedure:                 Laparoscopic cholecystectomy with cholangiogram, repair incarcerated incisional hernia.  Surgeon:                     Angelia Mould. Derrell Lolling, M.D., FACS  Assistant:                      Cyndia Bent,  M.D., FACS  Operative Indications:   Alexandria Leach is a 61 y.o. female. This patient was referred back to me by Dr. Pearson Grippe for evaluation of what appears to be symptomatic gallstones.  I saw this woman in May of this year for an umbilical  incisional hernia, but her symptoms were left lower quadrant pain and they did not seem to correlate. She saw Dr. Dorena Cookey who did not think anything further needed to be done since she had had a colonoscopy 3 years ago. The left lower quadrant pain has resolved.  She's now been having intermittent episodes of centralized and right-sided abdominal pain, no nausea vomiting or fever. No change in bowel habits. Lab work on 10/05/2011 reveals normal liver function tests, normal CBC, normal lipase, and normal urinalysis.  Her bowel movements have not changed. She takes proton pump inhibitors chronically.  She saw Dr. Selena Batten and was reevaluated and gallbladder ultrasound on 10/21/2011 shows small gallstones. She says that she was tender in the right upper quadrant when they did the ultrasound. On exam she has a transverse incision below the umbilicus from previous surgery and a hernia at that location containing incarcerated fatty tissue.  Past history is significant for DVT, off Coumadin since January 2011; rheumatic heart disease and mitral valve replacement, but recently had ECHO, no real cardiac symptoms, and is followed closely by [facility]; hysterectomy and BSO, hypertension, anxiety and depression, umbilical hernia,  fibromyalgia, hyperlipidemia. She is brought to the operating room electively for cholecystectomy and repair of her incarcerated incisional hernia.   Operative Findings:       The gallbladder was chronically inflamed, discolored, with fairly extensive chronic adhesions to it. The dissection plane in the gallbladder bed was very sticky suggesting chronic inflammatory change. The cholangiogram was normal, showing normal intrahepatic and extrahepatic biliary anatomy, no filling defects, and no obstruction with good flow of contrast into the duodenum. There was an incisional hernia around the umbilicus containing incarcerated preperitoneal fat. There was no evidence of any disease of the small intestine, large intestine,  duodenum stomach or liver.  Procedure in Detail:          Following the induction of general endotracheal anesthesia the patient's abdomen was prepped and draped in a sterile fashion, antibiotics were given, and a surgical time out was performed. 0.5% Marcaine with epinephrine was used as a local infiltration anesthetic.  I made a transverse incision below the umbilicus, through the old scar. Dissection was carried down to the base of the umbilicus. I simply made a vertical incision in the fascia just below the umbilical defect and into the peritoneal cavity under direct vision. An 11 mm Hassan trocar was inserted and secured with pursestring suture of  0 Vicryl. Pneumoperitoneum was created and  a videocamera was inserted. We had good visualization. An 11 mm trocar was placed in the subxiphoid region and two 5 mm trocars were placed in the right upper quadrant. The gallbladder fundus was identified and elevated. I slowly dissected all the adhesions off of the body and infundibulum of the gallbladder. I slowly dissected out the cystic duct and cystic artery. I was able isolate the cystic artery as it went onto the wall of the gallbladder, securing it with multiple ligaclips and divided it. The  cholangiogram catheter was inserted the cystic duct and a cholangiogram was obtained using the C-arm. The cholangiogram was normal as described above. The cholangiogram catheter was removed, the cystic duct was secured with multiple metal clips and divided. The gallbladder was dissected from its bed with electrocautery, placed in a specimen bag and removed. The dissection of the gallbladder from the liver bed was somewhat tedious because of chronic inflammation. At the end of the case we did cauterize the gallbladder better nicely and there did not appear to be any bleeding or bile leak. We irrigated out the subhepatic and subphrenic spaces. The irrigation fluid returned clear. We removed all trocars and released the pneumoperitoneum.  At the incisional hernia site I then dissected all of the subcutaneous tissue away from the fascia circumferentially. We removed the incarcerated preperitoneal fat and debrided the hernia sac away above the defect. I palpated both above and below and felt no other defects. I closed the fascia vertically with 5 interrupted sutures of #1 Novofil. The wounds were irrigated and the subcutaneous tissue closed with 3-0 Vicryl sutures. All the skin incisions were closed with subcuticular sutures of 4-0 Monocryl and Dermabond. The patient tolerated the procedure well and was taken to the recovery room in stable condition. There were no complications. Counts correct. EBL 15 cc.     Angelia Mould. Derrell Lolling, M.D., FACS General and Minimally Invasive Surgery Breast and Colorectal Surgery  11/27/2011 12:21 PM

## 2011-11-27 NOTE — Anesthesia Postprocedure Evaluation (Signed)
  Anesthesia Post-op Note  Patient: Alexandria Leach  Procedure(s) Performed: Procedure(s) (LRB) with comments: LAPAROSCOPIC CHOLECYSTECTOMY WITH INTRAOPERATIVE CHOLANGIOGRAM (N/A) - laparoscopic cholecystectomy with choleangiogram umbilical hernia repair HERNIA REPAIR UMBILICAL ADULT (N/A)  Patient Location: PACU  Anesthesia Type: General  Level of Consciousness: awake, alert , oriented and patient cooperative  Airway and Oxygen Therapy: Patient Spontanous Breathing and Patient connected to nasal cannula oxygen  Post-op Pain: mild  Post-op Assessment: Post-op Vital signs reviewed, Patient's Cardiovascular Status Stable, Respiratory Function Stable, Patent Airway, No signs of Nausea or vomiting and Pain level controlled  Post-op Vital Signs: stable  Complications: No apparent anesthesia complications

## 2011-11-27 NOTE — Preoperative (Signed)
Beta Blockers   Reason not to administer Beta Blockers:Not Applicable 

## 2011-11-27 NOTE — Interval H&P Note (Signed)
History and Physical Interval Note:  11/27/2011 10:20 AM  Alexandria Leach  has presented today for surgery, with the diagnosis of gallstones and incarcerated umbilical hernia  The goals and the various methods of treatment have been discussed with the patient and family. After consideration of risks, benefits and other options for treatment, the patient has consented to  Procedure(s) (LRB) with comments: LAPAROSCOPIC CHOLECYSTECTOMY WITH INTRAOPERATIVE CHOLANGIOGRAM (N/A) - laparoscopic cholecystectomy with choleangiogram umbilical hernia repair HERNIA REPAIR UMBILICAL ADULT (N/A) as a surgical intervention .  The patient's history has been reviewed, patient examined today, no change in status, stable for surgery.  I have reviewed the patient's chart and labs.  Questions were answered to the patient's satisfaction.     Ernestene Mention

## 2011-11-27 NOTE — Transfer of Care (Signed)
Immediate Anesthesia Transfer of Care Note  Patient: Alexandria Leach  Procedure(s) Performed: Procedure(s) (LRB) with comments: LAPAROSCOPIC CHOLECYSTECTOMY WITH INTRAOPERATIVE CHOLANGIOGRAM (N/A) - laparoscopic cholecystectomy with choleangiogram umbilical hernia repair HERNIA REPAIR UMBILICAL ADULT (N/A)  Patient Location: PACU  Anesthesia Type: General  Level of Consciousness: awake, alert , oriented and sedated  Airway & Oxygen Therapy: Patient Spontanous Breathing, Patient connected to nasal cannula oxygen and Patient connected to face mask oxygen  Post-op Assessment: Report given to PACU RN, Post -op Vital signs reviewed and stable and Patient moving all extremities  Post vital signs: Reviewed and stable  Complications: No apparent anesthesia complications

## 2011-11-28 ENCOUNTER — Encounter (HOSPITAL_COMMUNITY): Payer: Self-pay | Admitting: General Surgery

## 2011-11-28 MED ORDER — HYDROCODONE-ACETAMINOPHEN 5-325 MG PO TABS: 1.0000 | ORAL_TABLET | ORAL | Status: AC | PRN

## 2011-11-28 MED ORDER — KETOROLAC TROMETHAMINE 30 MG/ML IJ SOLN
30.0000 mg | Freq: Four times a day (QID) | INTRAMUSCULAR | Status: DC | PRN
Start: 2011-11-28 — End: 2011-11-28

## 2011-11-28 NOTE — Discharge Summary (Signed)
Patient ID: Alexandria Leach [id number] 61 y.o. 1951/01/21  11/27/2011  Discharge date and time: November 28, 2011  Admitting Physician: Ernestene Mention  Discharge Physician: Ernestene Mention  Admission Diagnoses: gallstones  umbilical hernia  Discharge Diagnoses: chronic cholecystitis with cholelithiasis, incarcerated incisional hernia.  Operations: Procedure(s): LAPAROSCOPIC CHOLECYSTECTOMY WITH INTRAOPERATIVE CHOLANGIOGRAM HERNIA REPAIR UMBILICAL ADULT  Admission Condition: good  Discharged Condition: good  Indication for Admission: Alexandria Leach is a 61 y.o. female. This patient was referred back to me by Dr. Pearson Grippe for evaluation of what appears to be symptomatic gallstones.  I saw this woman in May of this year for an umbilical incisional  hernia, but her symptoms were left lower quadrant pain and they did not seem to correlate. She saw Dr. Dorena Cookey who did not think anything further needed to be done since she had had a colonoscopy 3 years ago. The left lower quadrant pain has resolved.  She's now been having intermittent episodes of centralized and right-sided abdominal pain, no nausea vomiting or fever. No change in bowel habits. Lab work on 10/05/2011 reveals normal liver function tests, normal CBC, normal lipase, and normal urinalysis.  Her bowel movements have not changed. She takes proton pump inhibitors chronically.  She saw Dr. Selena Batten and was reevaluated and gallbladder ultrasound on 10/21/2011 shows small gallstones. She says that she was tender in the right upper quadrant when they did the ultrasound.    Past history is significant for DVT, off Coumadin since January 2011; rheumatic heart disease and mitral valve replacement, but recently had ECHO, no real cardiac symptoms, and is followed closely by [facility]ers; hysterectomy and BSO, hypertension, anxiety and depression, umbilical hernia, fibromyalgia, hyperlipidemia.  She was brought to the hospital  electively for cholecystectomy and repair of her incisional hernia.  Hospital Course: on the day of admission the patient was taken to the operating room and underwent a laparoscopic cholecystectomy with cholangiogram and repair of her incisional hernia. The cholangiogram was normal. The gallbladder was chronically inflamed. The hernia at her umbilicus from previous surgery had incarcerated preperitoneal fat and omentum which was debris and the hernia was repaired primarily with Novafil sutures. Overnight she remains stable but had problems with nausea. She was otherwise doing well with a benign abdominal exam.  She progressed in her diet and activities the morning after surgery and felt ready to go home at about 11:00 AM.the nausea had completely resolved. She was discharged home at that time. She was given instructions in diet and activities and wound care. She was given a prescription for Vicodin for pain. She was asked to return to see me in the office in approximately 3 weeks.  Consults: None  Significant Diagnostic Studies: radiology: intraoperative cholangiogram  Treatments: surgery: laparoscopic cholecystectomy with cholangiogram, repair incisional hernia.  Disposition: Home  Patient Instructions:   Alexandria Leach, Alexandria Leach  Home Medication Instructions NUU:[id number]   Printed on:11/28/11 0713  Medication Information                    aspirin 325 MG EC tablet Take 325 mg by mouth every morning.            cholecalciferol (VITAMIN D) 1000 UNITS tablet Take 1,000 Units by mouth 2 (two) times daily.            famotidine (PEPCID) 20 MG tablet Take 20 mg by mouth 2 (two) times daily.             traMADol (ULTRAM) 50  MG tablet Take 50 mg by mouth every 6 (six) hours as needed. For pain. Maximum dose= 8 tablets per day for pain.           gabapentin (NEURONTIN) 300 MG capsule Take 600 mg by mouth 3 (three) times daily.            l-methylfolate-B6-B12 (METANX) 3-35-2 MG TABS Take 1  tablet by mouth 2 (two) times daily.            omeprazole (PRILOSEC) 20 MG capsule Take 20 mg by mouth every morning.            Cinnamon 500 MG TABS Take 500 mg by mouth daily.            clonazePAM (KLONOPIN) 1 MG tablet Take 0.5 mg by mouth 2 (two) times daily.           amLODipine (NORVASC) 5 MG tablet Take 5 mg by mouth every morning.           metoprolol succinate (TOPROL-XL) 50 MG 24 hr tablet Take 50 mg by mouth every morning. Take with or immediately following a meal.           hydrochlorothiazide (MICROZIDE) 12.5 MG capsule Take 12.5 mg by mouth every morning.           potassium chloride SA (K-DUR,KLOR-CON) 20 MEQ tablet Take 20 mEq by mouth every morning.           HYDROcodone-acetaminophen (NORCO/VICODIN) 5-325 MG per tablet Take 1-2 tablets by mouth every 4 (four) hours as needed for pain.             Activity: no sports or heavy lifting for 4 weeks. She may drive her car in one to 2 days. She may shower. Low-fat diet. Diet: low fat, low cholesterol diet Wound Care: none needed  Follow-up:  With Dr. Derrell Lolling in 3 weeks.  Signed: Angelia Mould. Derrell Lolling, M.D., FACS General and minimally invasive surgery Breast and Colorectal Surgery  11/28/2011, 7:13 AM

## 2011-11-28 NOTE — Progress Notes (Signed)
Discharge instructions/Med Rec Sheet reviewed w/ pt. Pt expressed understanding and copies given w/ prescriptions. Pt d/c'd in stable condition via w/c, accompanied by discharge volunteers 

## 2011-11-28 NOTE — Progress Notes (Signed)
1 Day Post-Op  Subjective: Stable and alert. No distress. States she has had some nausea and dry heaves but no vomiting. Voiding normally. Up to bathroom. Vital signs stable. No fever. I discussed operative findings with the patient.  Objective: Vital signs in last 24 hours: Temp:  [97 F (36.1 C)-99.9 F (37.7 C)] 99.9 F (37.7 C) (09/12 0506) Pulse Rate:  [67-92] 92  (09/12 0506) Resp:  [10-20] 16  (09/12 0506) BP: (121-158)/(61-87) 126/61 mmHg (09/12 0506) SpO2:  [88 %-100 %] 91 % (09/12 0506) Weight:  [185 lb 13.6 oz (84.3 kg)] 185 lb 13.6 oz (84.3 kg) (09/11 1430) Last BM Date: 11/26/11  Intake/Output from previous day: 09/11 0701 - 09/12 0700 In: 1800 [I.V.:1800] Out: 10 [Blood:10] Intake/Output this shift:     Exam: General appearance: alert. No distress. Mental status normal. Cooperative. Resp: clear to auscultation bilaterally GI: abdomen soft. Active bowel sounds. Nondistended. Some ecchymoses around the umbilicus from hernia repair. No bleeding. No hematoma. Benign postop exam     Lab Results:  Results for orders placed during the hospital encounter of 11/27/11 (from the past 24 hour(s))  NO BLOOD PRODUCTS     Status: Normal   Collection Time   11/27/11  9:25 AM      Component Value Range   Transfuse no blood products       Value: TRANSFUSE NO BLOOD PRODUCTS, VERIFIED BY KRISTI MOORE,RN     Studies/Results: @RISRSLT24 @     . amLODipine  5 mg Oral q morning - 10a  . aspirin EC  325 mg Oral Daily  .  ceFAZolin (ANCEF) IV  2 g Intravenous 60 min Pre-Op  .  ceFAZolin (ANCEF) IV  2 g Intravenous Q8H  . cholecalciferol  1,000 Units Oral BID  . clonazePAM  0.5 mg Oral BID  . famotidine  20 mg Oral BID  . gabapentin  600 mg Oral TID  . heparin  5,000 Units Subcutaneous Once  . heparin  5,000 Units Subcutaneous Q8H  . hydrochlorothiazide  12.5 mg Oral q morning - 10a  . HYDROmorphone      . l-methylfolate-B6-B12  1 tablet Oral BID  . metoprolol succinate   50 mg Oral q morning - 10a  . pantoprazole  40 mg Oral Q1200  . potassium chloride SA  20 mEq Oral q morning - 10a  . DISCONTD: aspirin  325 mg Oral q morning - 10a  . DISCONTD: chlorhexidine  1 application Topical Once     Assessment/Plan: s/p Procedure(s): LAPAROSCOPIC CHOLECYSTECTOMY WITH INTRAOPERATIVE CHOLANGIOGRAM HERNIA REPAIR UMBILICAL ADULT  POD #1. Stable. Nausea, possibly related to anesthesia or narcotic medication. Will discontinue morphine and substitute Toradol. Allow hydrocodone. Advance diet and activities. Hopefully discharged later today if discharge criteria met.      LOS: 1 day    Kianah Harries M. Derrell Lolling, M.D., [facility] General and Minimally invasive Surgery Breast and Colorectal Surgery Office:   [phone] Pager:   [phone]  11/28/2011  . .prob

## 2011-11-28 NOTE — Progress Notes (Signed)
Pt will not be D/C resulted to nausea. Dr. Derrell Lolling noted to give him a call once the nausea as reside. Pt stated she would like to remain on clear liquids unit lunch.

## 2011-11-29 ENCOUNTER — Telehealth (INDEPENDENT_AMBULATORY_CARE_PROVIDER_SITE_OTHER): Payer: Self-pay | Admitting: General Surgery

## 2011-11-29 NOTE — Telephone Encounter (Signed)
Called patient to advise of post op appointment set up. Unable to reach the patient, no voicemail and answering machine pick up.  Patient set up to see Dr. Derrell Lolling on 12/12/11 at 8:15. Surgery was 11/27/11.

## 2011-11-29 NOTE — Telephone Encounter (Signed)
Called patient and advised of appointment set for 12/12/11 at 8:15. Advised patient to contact our office if she develops signs of infection or fever. The patient agreed.

## 2011-12-01 ENCOUNTER — Emergency Department (HOSPITAL_COMMUNITY): Payer: PRIVATE HEALTH INSURANCE

## 2011-12-01 ENCOUNTER — Inpatient Hospital Stay (HOSPITAL_COMMUNITY)
Admission: EM | Admit: 2011-12-01 | Discharge: 2011-12-04 | DRG: 293 | Disposition: A | Discharge status: Home / Self Care | Payer: PRIVATE HEALTH INSURANCE | Attending: Internal Medicine | Admitting: Internal Medicine

## 2011-12-01 ENCOUNTER — Encounter (HOSPITAL_COMMUNITY): Payer: Self-pay | Admitting: *Deleted

## 2011-12-01 DIAGNOSIS — K8019 Calculus of gallbladder with other cholecystitis with obstruction: Secondary | ICD-10-CM

## 2011-12-01 DIAGNOSIS — M2550 Pain in unspecified joint: Secondary | ICD-10-CM

## 2011-12-01 DIAGNOSIS — I5031 Acute diastolic (congestive) heart failure: Principal | ICD-10-CM | POA: Diagnosis present

## 2011-12-01 DIAGNOSIS — I509 Heart failure, unspecified: Secondary | ICD-10-CM | POA: Diagnosis present

## 2011-12-01 DIAGNOSIS — I5030 Unspecified diastolic (congestive) heart failure: Secondary | ICD-10-CM

## 2011-12-01 DIAGNOSIS — F411 Generalized anxiety disorder: Secondary | ICD-10-CM | POA: Diagnosis present

## 2011-12-01 DIAGNOSIS — Z7982 Long term (current) use of aspirin: Secondary | ICD-10-CM

## 2011-12-01 DIAGNOSIS — IMO0001 Reserved for inherently not codable concepts without codable children: Secondary | ICD-10-CM | POA: Diagnosis present

## 2011-12-01 DIAGNOSIS — Z86718 Personal history of other venous thrombosis and embolism: Secondary | ICD-10-CM

## 2011-12-01 DIAGNOSIS — R918 Other nonspecific abnormal finding of lung field: Secondary | ICD-10-CM | POA: Diagnosis present

## 2011-12-01 DIAGNOSIS — M76899 Other specified enthesopathies of unspecified lower limb, excluding foot: Secondary | ICD-10-CM

## 2011-12-01 DIAGNOSIS — K429 Umbilical hernia without obstruction or gangrene: Secondary | ICD-10-CM

## 2011-12-01 DIAGNOSIS — M359 Systemic involvement of connective tissue, unspecified: Secondary | ICD-10-CM | POA: Diagnosis present

## 2011-12-01 DIAGNOSIS — E7800 Pure hypercholesterolemia, unspecified: Secondary | ICD-10-CM | POA: Diagnosis present

## 2011-12-01 DIAGNOSIS — K5909 Other constipation: Secondary | ICD-10-CM | POA: Diagnosis present

## 2011-12-01 DIAGNOSIS — N951 Menopausal and female climacteric states: Secondary | ICD-10-CM

## 2011-12-01 DIAGNOSIS — I359 Nonrheumatic aortic valve disorder, unspecified: Secondary | ICD-10-CM | POA: Diagnosis present

## 2011-12-01 DIAGNOSIS — F339 Major depressive disorder, recurrent, unspecified: Secondary | ICD-10-CM | POA: Diagnosis present

## 2011-12-01 DIAGNOSIS — Z79899 Other long term (current) drug therapy: Secondary | ICD-10-CM

## 2011-12-01 DIAGNOSIS — R002 Palpitations: Secondary | ICD-10-CM

## 2011-12-01 DIAGNOSIS — J189 Pneumonia, unspecified organism: Secondary | ICD-10-CM

## 2011-12-01 DIAGNOSIS — R0600 Dyspnea, unspecified: Secondary | ICD-10-CM

## 2011-12-01 DIAGNOSIS — G4700 Insomnia, unspecified: Secondary | ICD-10-CM | POA: Diagnosis present

## 2011-12-01 DIAGNOSIS — I10 Essential (primary) hypertension: Secondary | ICD-10-CM | POA: Diagnosis present

## 2011-12-01 DIAGNOSIS — M129 Arthropathy, unspecified: Secondary | ICD-10-CM | POA: Diagnosis present

## 2011-12-01 DIAGNOSIS — Z9079 Acquired absence of other genital organ(s): Secondary | ICD-10-CM

## 2011-12-01 DIAGNOSIS — F3289 Other specified depressive episodes: Secondary | ICD-10-CM | POA: Diagnosis present

## 2011-12-01 DIAGNOSIS — I099 Rheumatic heart disease, unspecified: Secondary | ICD-10-CM | POA: Diagnosis present

## 2011-12-01 DIAGNOSIS — Z9889 Other specified postprocedural states: Secondary | ICD-10-CM

## 2011-12-01 DIAGNOSIS — M549 Dorsalgia, unspecified: Secondary | ICD-10-CM

## 2011-12-01 DIAGNOSIS — I350 Nonrheumatic aortic (valve) stenosis: Secondary | ICD-10-CM | POA: Diagnosis present

## 2011-12-01 DIAGNOSIS — F329 Major depressive disorder, single episode, unspecified: Secondary | ICD-10-CM

## 2011-12-01 DIAGNOSIS — I739 Peripheral vascular disease, unspecified: Secondary | ICD-10-CM | POA: Diagnosis present

## 2011-12-01 LAB — BASIC METABOLIC PANEL
BUN: 8 mg/dL (ref 6–23)
Creatinine, Ser: 0.94 mg/dL (ref 0.50–1.10)
GFR calc Af Amer: 75 mL/min — ABNORMAL LOW (ref >90)
GFR calc non Af Amer: 65 mL/min — ABNORMAL LOW (ref >90)
Potassium: 3.8 mEq/L (ref 3.5–5.1)

## 2011-12-01 LAB — CBC WITH DIFFERENTIAL/PLATELET
Basophils Absolute: 0 10*3/uL (ref 0.0–0.1)
Basophils Relative: 0 % (ref 0–1)
Eosinophils Absolute: 0.2 10*3/uL (ref 0.0–0.7)
Hemoglobin: 13.5 g/dL (ref 12.0–15.0)
MCH: 27.4 pg (ref 26.0–34.0)
MCHC: 33.7 g/dL (ref 30.0–36.0)
Monocytes Absolute: 0.6 10*3/uL (ref 0.1–1.0)
Monocytes Relative: 8 % (ref 3–12)
Neutrophils Relative %: 67 % (ref 43–77)
RDW: 14.5 % (ref 11.5–15.5)

## 2011-12-01 LAB — URINALYSIS, ROUTINE W REFLEX MICROSCOPIC
Bilirubin Urine: NEGATIVE
Ketones, ur: NEGATIVE mg/dL
Nitrite: NEGATIVE
Urobilinogen, UA: 4 mg/dL — ABNORMAL HIGH (ref 0.0–1.0)

## 2011-12-01 LAB — TROPONIN I: Troponin I: <0.3 ng/mL (ref <0.30)

## 2011-12-01 MED ORDER — IOHEXOL 350 MG/ML SOLN
100.0000 mL | Freq: Once | INTRAVENOUS | Status: AC | PRN
  Administered 2011-12-01: 100 mL via INTRAVENOUS

## 2011-12-01 NOTE — ED Notes (Signed)
Hernia and gall bladder surgery this past wed.  Sob and inc. Warmth over incision sites, esp. By [employer]. Took vicodin this morning. No n/v. Last bm 7 days.

## 2011-12-02 ENCOUNTER — Encounter (HOSPITAL_COMMUNITY): Payer: Self-pay | Admitting: Orthopedic Surgery

## 2011-12-02 DIAGNOSIS — R6889 Other general symptoms and signs: Secondary | ICD-10-CM

## 2011-12-02 DIAGNOSIS — J189 Pneumonia, unspecified organism: Secondary | ICD-10-CM

## 2011-12-02 HISTORY — DX: Pneumonia, unspecified organism: J18.9

## 2011-12-02 LAB — CBC
HCT: 38 % (ref 36.0–46.0)
MCH: 27.5 pg (ref 26.0–34.0)
MCHC: 34.2 g/dL (ref 30.0–36.0)
MCV: 80.5 fL (ref 78.0–100.0)
Platelets: 302 10*3/uL (ref 150–400)
RDW: 14.3 % (ref 11.5–15.5)

## 2011-12-02 LAB — BASIC METABOLIC PANEL
BUN: 6 mg/dL (ref 6–23)
Calcium: 10.2 mg/dL (ref 8.4–10.5)
Creatinine, Ser: 0.91 mg/dL (ref 0.50–1.10)
GFR calc Af Amer: 78 mL/min — ABNORMAL LOW (ref >90)
GFR calc non Af Amer: 68 mL/min — ABNORMAL LOW (ref >90)

## 2011-12-02 MED ORDER — ONDANSETRON HCL 4 MG/2ML IJ SOLN: 4.0000 mg | Freq: Four times a day (QID) | INTRAMUSCULAR | Status: DC | PRN

## 2011-12-02 MED ORDER — ZOLPIDEM TARTRATE 5 MG PO TABS: 5.0000 mg | ORAL_TABLET | Freq: Every evening | ORAL | Status: DC | PRN

## 2011-12-02 MED ORDER — PANTOPRAZOLE SODIUM 40 MG PO TBEC
40.0000 mg | DELAYED_RELEASE_TABLET | Freq: Every day | ORAL | Status: DC
  Administered 2011-12-02 – 2011-12-03 (×2): 40 mg via ORAL
  Filled 2011-12-02 (×2): qty 1

## 2011-12-02 MED ORDER — VANCOMYCIN HCL IN DEXTROSE 1-5 GM/200ML-% IV SOLN
1000.0000 mg | Freq: Two times a day (BID) | INTRAVENOUS | Status: DC
  Administered 2011-12-02 – 2011-12-04 (×4): 1000 mg via INTRAVENOUS
  Filled 2011-12-02 (×6): qty 200

## 2011-12-02 MED ORDER — CLONAZEPAM 0.5 MG PO TABS
0.5000 mg | ORAL_TABLET | Freq: Two times a day (BID) | ORAL | Status: DC | PRN
  Administered 2011-12-02 – 2011-12-03 (×3): 0.5 mg via ORAL
  Filled 2011-12-02 (×3): qty 1

## 2011-12-02 MED ORDER — HYDROCHLOROTHIAZIDE 12.5 MG PO CAPS
12.5000 mg | ORAL_CAPSULE | Freq: Every morning | ORAL | Status: DC
  Administered 2011-12-02: 12.5 mg via ORAL
  Filled 2011-12-02: qty 1

## 2011-12-02 MED ORDER — PNEUMOCOCCAL VAC POLYVALENT 25 MCG/0.5ML IJ INJ
0.5000 mL | INJECTION | INTRAMUSCULAR | Status: AC
  Filled 2011-12-02: qty 0.5

## 2011-12-02 MED ORDER — BENZONATATE 100 MG PO CAPS
100.0000 mg | ORAL_CAPSULE | Freq: Three times a day (TID) | ORAL | Status: DC | PRN
  Filled 2011-12-02: qty 1

## 2011-12-02 MED ORDER — DEXTROSE 5 % IV SOLN
1.0000 g | Freq: Once | INTRAVENOUS | Status: AC
  Administered 2011-12-02: 1 g via INTRAVENOUS
  Filled 2011-12-02: qty 10

## 2011-12-02 MED ORDER — SODIUM CHLORIDE 0.9 % IJ SOLN: 3.0000 mL | INTRAMUSCULAR | Status: DC | PRN

## 2011-12-02 MED ORDER — SODIUM CHLORIDE 0.9 % IV SOLN
250.0000 mL | INTRAVENOUS | Status: DC | PRN
  Administered 2011-12-02: 250 mL via INTRAVENOUS

## 2011-12-02 MED ORDER — VANCOMYCIN HCL IN DEXTROSE 1-5 GM/200ML-% IV SOLN
1000.0000 mg | Freq: Once | INTRAVENOUS | Status: AC
  Administered 2011-12-02: 1000 mg via INTRAVENOUS
  Filled 2011-12-02: qty 200

## 2011-12-02 MED ORDER — ACETAMINOPHEN 325 MG PO TABS: 650.0000 mg | ORAL_TABLET | Freq: Four times a day (QID) | ORAL | Status: DC | PRN

## 2011-12-02 MED ORDER — METOPROLOL SUCCINATE ER 50 MG PO TB24
50.0000 mg | ORAL_TABLET | Freq: Every morning | ORAL | Status: DC
  Administered 2011-12-02 – 2011-12-04 (×3): 50 mg via ORAL
  Filled 2011-12-02 (×3): qty 1

## 2011-12-02 MED ORDER — ASPIRIN EC 325 MG PO TBEC
325.0000 mg | DELAYED_RELEASE_TABLET | Freq: Every morning | ORAL | Status: DC
  Administered 2011-12-02 – 2011-12-04 (×3): 325 mg via ORAL
  Filled 2011-12-02 (×3): qty 1

## 2011-12-02 MED ORDER — HYDROCODONE-ACETAMINOPHEN 5-325 MG PO TABS
1.0000 | ORAL_TABLET | ORAL | Status: DC | PRN
  Administered 2011-12-02 (×3): 2 via ORAL
  Administered 2011-12-03: 1 via ORAL
  Filled 2011-12-02: qty 2
  Filled 2011-12-02: qty 1
  Filled 2011-12-02 (×2): qty 2

## 2011-12-02 MED ORDER — ENOXAPARIN SODIUM 40 MG/0.4ML ~~LOC~~ SOLN
40.0000 mg | SUBCUTANEOUS | Status: DC
  Administered 2011-12-02 – 2011-12-03 (×2): 40 mg via SUBCUTANEOUS
  Filled 2011-12-02 (×3): qty 0.4

## 2011-12-02 MED ORDER — DEXTROSE 5 % IV SOLN
500.0000 mg | INTRAVENOUS | Status: DC
  Filled 2011-12-02: qty 500

## 2011-12-02 MED ORDER — AMLODIPINE BESYLATE 5 MG PO TABS
5.0000 mg | ORAL_TABLET | Freq: Every morning | ORAL | Status: DC
  Administered 2011-12-02 – 2011-12-04 (×3): 5 mg via ORAL
  Filled 2011-12-02 (×3): qty 1

## 2011-12-02 MED ORDER — DEXTROSE 5 % IV SOLN
500.0000 mg | Freq: Once | INTRAVENOUS | Status: AC
  Administered 2011-12-02: 500 mg via INTRAVENOUS
  Filled 2011-12-02: qty 500

## 2011-12-02 MED ORDER — FUROSEMIDE 10 MG/ML IJ SOLN
20.0000 mg | Freq: Two times a day (BID) | INTRAMUSCULAR | Status: AC
  Administered 2011-12-02 – 2011-12-04 (×4): 20 mg via INTRAVENOUS
  Filled 2011-12-02 (×5): qty 2

## 2011-12-02 MED ORDER — LEVOFLOXACIN IN D5W 750 MG/150ML IV SOLN
750.0000 mg | INTRAVENOUS | Status: DC
  Administered 2011-12-02 – 2011-12-03 (×2): 750 mg via INTRAVENOUS
  Filled 2011-12-02 (×5): qty 150

## 2011-12-02 MED ORDER — ONDANSETRON HCL 4 MG PO TABS: 4.0000 mg | ORAL_TABLET | Freq: Four times a day (QID) | ORAL | Status: DC | PRN

## 2011-12-02 MED ORDER — SENNOSIDES-DOCUSATE SODIUM 8.6-50 MG PO TABS
1.0000 | ORAL_TABLET | Freq: Every evening | ORAL | Status: DC | PRN
  Administered 2011-12-02: 1 via ORAL
  Filled 2011-12-02: qty 1

## 2011-12-02 MED ORDER — ACETAMINOPHEN 650 MG RE SUPP: 650.0000 mg | Freq: Four times a day (QID) | RECTAL | Status: DC | PRN

## 2011-12-02 MED ORDER — ALUM & MAG HYDROXIDE-SIMETH 200-200-20 MG/5ML PO SUSP: 30.0000 mL | Freq: Four times a day (QID) | ORAL | Status: DC | PRN

## 2011-12-02 MED ORDER — IPRATROPIUM BROMIDE 0.02 % IN SOLN: 0.5000 mg | RESPIRATORY_TRACT | Status: DC | PRN

## 2011-12-02 MED ORDER — DEXTROSE 5 % IV SOLN
1.0000 g | Freq: Two times a day (BID) | INTRAVENOUS | Status: DC
  Administered 2011-12-02 – 2011-12-03 (×3): 1 g via INTRAVENOUS
  Filled 2011-12-02 (×5): qty 1

## 2011-12-02 MED ORDER — FUROSEMIDE 40 MG PO TABS
40.0000 mg | ORAL_TABLET | Freq: Every day | ORAL | Status: DC
  Administered 2011-12-02 – 2011-12-03 (×2): 40 mg via ORAL
  Filled 2011-12-02 (×3): qty 1

## 2011-12-02 MED ORDER — BISACODYL 10 MG RE SUPP
10.0000 mg | Freq: Every day | RECTAL | Status: DC | PRN
  Administered 2011-12-02: 10 mg via RECTAL
  Filled 2011-12-02: qty 1

## 2011-12-02 MED ORDER — SODIUM CHLORIDE 0.9 % IJ SOLN
3.0000 mL | Freq: Two times a day (BID) | INTRAMUSCULAR | Status: DC
  Administered 2011-12-02 – 2011-12-03 (×4): 3 mL via INTRAVENOUS

## 2011-12-02 MED ORDER — GABAPENTIN 300 MG PO CAPS
300.0000 mg | ORAL_CAPSULE | Freq: Three times a day (TID) | ORAL | Status: DC
  Administered 2011-12-02 – 2011-12-04 (×7): 300 mg via ORAL
  Filled 2011-12-02 (×9): qty 1

## 2011-12-02 MED ORDER — DEXTROSE 5 % IV SOLN
1.0000 g | INTRAVENOUS | Status: DC
  Filled 2011-12-02: qty 10

## 2011-12-02 MED ORDER — ALBUTEROL SULFATE (5 MG/ML) 0.5% IN NEBU: 2.5000 mg | INHALATION_SOLUTION | RESPIRATORY_TRACT | Status: DC | PRN

## 2011-12-02 NOTE — ED Notes (Signed)
REPORT GIVEN TO FLOOR NURSE 5- NORTH , TRANSPORTED PT. IN STABLE CONDITION , RESPIRATIONS UNLABORED , IV SITE UNREMARKABLE.

## 2011-12-02 NOTE — Progress Notes (Signed)
UR COMPLETED  

## 2011-12-02 NOTE — ED Notes (Signed)
ROCEPHIN IV HELD UNTIL BLOOD CULTURES ARE COLLECTED BY PHLEBOTOMIST.

## 2011-12-02 NOTE — Progress Notes (Addendum)
ANTIBIOTIC CONSULT NOTE - INITIAL  Pharmacy Consult for vancomycin Indication: pneumonia  Allergies  Allergen Reactions  . Statins Other (See Comments)    Muscle cramps  . Sulfa Antibiotics Nausea And Vomiting    Patient Measurements: Height: 5\' 2"  (157.5 cm) Weight: 178 lb (80.74 kg) IBW/kg (Calculated) : 50.1   Vital Signs: Temp: 99.5 F (37.5 C) (09/15 2341) Temp src: Oral (09/15 2341) BP: 136/72 mmHg (09/16 0045) Pulse Rate: 97  (09/16 0045)  Labs:  [facility] ([facility]) 12/01/11 1907  WBC 7.7  HGB 13.5  PLT 329  LABCREA --  CREATININE 0.94   Estimated Creatinine Clearance: 63.4 ml/min (by C-G formula based on Cr of 0.94).  Microbiology: Recent Results (from the past 720 hour(s))  SURGICAL PCR SCREEN     Status: Normal   Collection Time   11/25/11  9:22 AM      Component Value Range Status Comment   MRSA, PCR NEGATIVE  NEGATIVE Final    Staphylococcus aureus NEGATIVE  NEGATIVE Final     Medical History: Past Medical History  Diagnosis Date  . Depression   . HTN (hypertension)   . Rheumatic heart disease     s/p mitral valve repair 1980  . Hypercholesteremia   . Insomnia   . Anxiety   . DVT (deep venous thrombosis)   . Pain, joint, multiple sites   . Fibromyalgia   . Connective tissue disease   . Arthritis   . Heart murmur   . Mitral stenosis   . Personal history of urinary calculi   . PONV (postoperative nausea and vomiting)   . Peripheral vascular disease     hx dvt  . Stones in the urinary tract   . Refusal of blood transfusions as patient is Jehovah's Witness     Medications:  Prescriptions prior to admission  Medication Sig Dispense Refill  . amLODipine (NORVASC) 5 MG tablet Take 5 mg by mouth every morning.      Marland Kitchen aspirin 325 MG EC tablet Take 325 mg by mouth every morning.       . cholecalciferol (VITAMIN D) 1000 UNITS tablet Take 1,000 Units by mouth 2 (two) times daily.       . Cinnamon 500 MG TABS Take 500 mg by mouth daily.       .  clonazePAM (KLONOPIN) 0.5 MG tablet Take 0.5 mg by mouth 2 (two) times daily as needed. For anxiety      . famotidine (PEPCID) 20 MG tablet Take 20 mg by mouth 2 (two) times daily.        Marland Kitchen gabapentin (NEURONTIN) 300 MG capsule Take 300 mg by mouth 3 (three) times daily.       . hydrochlorothiazide (MICROZIDE) 12.5 MG capsule Take 12.5 mg by mouth every morning.      Marland Kitchen HYDROcodone-acetaminophen (NORCO/VICODIN) 5-325 MG per tablet Take 1-2 tablets by mouth every 4 (four) hours as needed for pain.  50 tablet  1  . l-methylfolate-B6-B12 (METANX) 3-35-2 MG TABS Take 1 tablet by mouth 2 (two) times daily.       . metoprolol succinate (TOPROL-XL) 50 MG 24 hr tablet Take 50 mg by mouth every morning. Take with or immediately following a meal.      . omeprazole (PRILOSEC) 20 MG capsule Take 20 mg by mouth every morning.       . traMADol (ULTRAM) 50 MG tablet Take 50 mg by mouth every 6 (six) hours as needed. For pain. Maximum dose= 8 tablets per  day for pain.       Scheduled:    . amLODipine  5 mg Oral q morning - 10a  . aspirin  325 mg Oral q morning - 10a  . azithromycin  500 mg Intravenous Once  . azithromycin  500 mg Intravenous Q24H  . cefTRIAXone (ROCEPHIN)  IV  1 g Intravenous Once  . cefTRIAXone (ROCEPHIN)  IV  1 g Intravenous Q24H  . enoxaparin (LOVENOX) injection  40 mg Subcutaneous Q24H  . gabapentin  300 mg Oral TID  . hydrochlorothiazide  12.5 mg Oral q morning - 10a  . metoprolol succinate  50 mg Oral q morning - 10a  . pantoprazole  40 mg Oral Q1200  . sodium chloride  3 mL Intravenous Q12H  . vancomycin  1,000 mg Intravenous Once  . vancomycin  1,000 mg Intravenous Q12H    Assessment: 61yo female was discharged Thursday s/p cholecystectomy and hernia repair, now c/o cough with worsening SOB and periumbilical discomfort, CT shows bilateral infiltrates c/w PNA, to begin IV ABX.  Goal of Therapy:  Vancomycin trough level 15-20 mcg/ml  Plan:  Will begin vancomycin 1000mg  IV  Q12H and monitor CBC, Cx, levels prn.  Colleen Can PharmD BCPS 12/02/2011,2:46 AM   Addendum: Cefepime and Levoquin per pharmacy dosing have been added. The Rocephin and Azithromycin have been d/c'd.  Cefepime 1 Gm IV q12 hrs. Levofloxacin 750 IV q24 hrs.  Cardell Peach, PharmD

## 2011-12-02 NOTE — ED Provider Notes (Signed)
History     CSN: [id number]  Arrival date & time 12/01/11  1844   First MD Initiated Contact with Patient 12/01/11 2249      Chief Complaint  Patient presents with  . Shortness of Breath    (Consider location/radiation/quality/duration/timing/severity/associated sxs/prior treatment) Patient is a 61 y.o. female presenting with shortness of breath. The history is provided by the patient and the spouse. No language interpreter was used.  Shortness of Breath  The current episode started yesterday. The problem occurs continuously. The problem has been gradually worsening. The problem is moderate. The symptoms are relieved by rest. Associated symptoms include chest pressure, orthopnea, a fever, cough and shortness of breath. Pertinent negatives include no chest pain, no sore throat, no stridor and no wheezing. She has not inhaled smoke recently. She has had prior hospitalizations. She has had no prior ICU admissions. She has had no prior intubations. Urine output has been normal. Recently, medical care has been given at this facility.   62 year old female coming in with shortness of breath for the last 2 days especially with exertion. Patient had gallbladder surgery on Wednesday 4 days ago. Past medical history of a DVT as well. States it hurts to take a deep breath. Patient is tachypnic and tachycardic with a low-grade fever 99.9. Past medical history depression hypertension, insomnia, anxiety, fibromyalgia, arthritis, heart murmur, peripheral vascular disease. Patient PCP is Dr. Selena Batten.   Past Medical History  Diagnosis Date  . Depression   . HTN (hypertension)   . Rheumatic heart disease     s/p mitral valve repair 1980  . Hypercholesteremia   . Insomnia   . Anxiety   . DVT (deep venous thrombosis)   . Pain, joint, multiple sites   . Fibromyalgia   . Connective tissue disease   . Arthritis   . Heart murmur   . Mitral stenosis   . Personal history of urinary calculi   . PONV  (postoperative nausea and vomiting)   . Peripheral vascular disease     hx dvt  . Stones in the urinary tract   . Refusal of blood transfusions as patient is Jehovah's Witness     Past Surgical History  Procedure Date  . Mitral valve repair 1980  . Tubal ligation 1980  . Vaginal hysterectomy 1992    for fibroids -- partial  . Cholecystectomy 11/27/2011    laproscopic  . Hernia repair 11/27/2011    laproscopic  . Cholecystectomy 11/27/2011    Procedure: LAPAROSCOPIC CHOLECYSTECTOMY WITH INTRAOPERATIVE CHOLANGIOGRAM;  Surgeon: Ernestene Mention, MD;  Location: [facility] OR;  Service: General;  Laterality: N/A;  laparoscopic cholecystectomy with choleangiogram umbilical hernia repair  . Umbilical hernia repair 11/27/2011    Procedure: HERNIA REPAIR UMBILICAL ADULT;  Surgeon: Ernestene Mention, MD;  Location: [facility] OR;  Service: General;  Laterality: N/A;    Family History  Problem Relation Age of Onset  . Lupus    . Kidney failure    . Bone cancer    . Heart disease Mother     MI in her 45s  . ALS Mother   . Kidney disease Father   . Cancer Brother     bone  . Cancer Maternal Uncle     bone    History  Substance Use Topics  . Smoking status: Never Smoker   . Smokeless tobacco: Never Used  . Alcohol Use: No    OB History    Grav Para Term Preterm Abortions TAB SAB Ect Mult  Living                  Review of Systems  Constitutional: Positive for fever.  HENT: Negative.  Negative for sore throat.   Eyes: Negative.   Respiratory: Positive for cough and shortness of breath. Negative for wheezing and stridor.   Cardiovascular: Positive for orthopnea. Negative for chest pain.  Gastrointestinal: Negative.   Neurological: Negative.   Psychiatric/Behavioral: Negative.   All other systems reviewed and are negative.    Allergies  Statins and Sulfa antibiotics  Home Medications   Current Outpatient Rx  Name Route Sig Dispense Refill  . AMLODIPINE BESYLATE 5 MG PO TABS Oral Take  5 mg by mouth every morning.    . ASPIRIN 325 MG PO TBEC Oral Take 325 mg by mouth every morning.     Marland Kitchen VITAMIN D 1000 UNITS PO TABS Oral Take 1,000 Units by mouth 2 (two) times daily.     Marland Kitchen CINNAMON 500 MG PO TABS Oral Take 500 mg by mouth daily.     Marland Kitchen CLONAZEPAM 0.5 MG PO TABS Oral Take 0.5 mg by mouth 2 (two) times daily as needed. For anxiety    . FAMOTIDINE 20 MG PO TABS Oral Take 20 mg by mouth 2 (two) times daily.      Marland Kitchen GABAPENTIN 300 MG PO CAPS Oral Take 300 mg by mouth 3 (three) times daily.     Marland Kitchen HYDROCHLOROTHIAZIDE 12.5 MG PO CAPS Oral Take 12.5 mg by mouth every morning.    Marland Kitchen HYDROCODONE-ACETAMINOPHEN 5-325 MG PO TABS Oral Take 1-2 tablets by mouth every 4 (four) hours as needed for pain. 50 tablet 1  . L-METHYLFOLATE-B6-B12 3-35-2 MG PO TABS Oral Take 1 tablet by mouth 2 (two) times daily.     Marland Kitchen METOPROLOL SUCCINATE ER 50 MG PO TB24 Oral Take 50 mg by mouth every morning. Take with or immediately following a meal.    . OMEPRAZOLE 20 MG PO CPDR Oral Take 20 mg by mouth every morning.     Marland Kitchen TRAMADOL HCL 50 MG PO TABS Oral Take 50 mg by mouth every 6 (six) hours as needed. For pain. Maximum dose= 8 tablets per day for pain.      BP 140/69  Pulse 99  Temp 99.5 F (37.5 C) (Oral)  Resp 27  SpO2 98%  Physical Exam  Nursing note and vitals reviewed. Constitutional: She is oriented to person, place, and time. She appears well-developed and well-nourished.  HENT:  Head: Normocephalic and atraumatic.  Eyes: Conjunctivae normal and EOM are normal. Pupils are equal, round, and reactive to light.  Neck: Normal range of motion. Neck supple.  Cardiovascular: Normal rate.   Pulmonary/Chest: Tachypnea noted. She is in respiratory distress. She has decreased breath sounds in the right lower field and the left lower field.  Abdominal: Soft.  Musculoskeletal: Normal range of motion. She exhibits no edema and no tenderness.  Neurological: She is alert and oriented to person, place, and  time. She has normal reflexes.  Skin: Skin is warm and dry.  Psychiatric: She has a normal mood and affect.    ED Course  Procedures (including critical care time)  Labs Reviewed  BASIC METABOLIC PANEL - Abnormal; Notable for the following:    Calcium 10.6 (*)     GFR calc non Af Amer 65 (*)     GFR calc Af Amer 75 (*)     All other components within normal limits  URINALYSIS, ROUTINE W  REFLEX MICROSCOPIC - Abnormal; Notable for the following:    APPearance CLOUDY (*)     Urobilinogen, UA 4.0 (*)     Leukocytes, UA SMALL (*)     All other components within normal limits  URINE MICROSCOPIC-ADD ON - Abnormal; Notable for the following:    Bacteria, UA FEW (*)     All other components within normal limits  CBC WITH DIFFERENTIAL  TROPONIN I  D-DIMER, QUANTITATIVE   Dg Chest 2 View  12/01/2011  *RADIOLOGY REPORT*  Clinical Data: Shortness of breath and chest pain.  CHEST - 2 VIEW  Comparison: PA and lateral chest 11/25/2011.  Findings: The patient has a small right pleural effusion and basilar airspace disease.  Lung volumes are low.  No pneumothorax identified.  The patient is status post median sternotomy.  Heart size is upper normal.  IMPRESSION: Small right pleural effusion and basilar airspace disease worrisome for pneumonia.   Original Report Authenticated By: Bernadene Bell. Maricela Curet, M.D.    Ct Angio Chest Pe W/cm &/or Wo Cm  12/01/2011  *RADIOLOGY REPORT*  Clinical Data: Hernia and gallbladder surgery this week.  Shortness of breath.  CT ANGIOGRAPHY CHEST  Technique:  Multidetector CT imaging of the chest using the standard protocol during bolus administration of intravenous contrast. Multiplanar reconstructed images including MIPs were obtained and reviewed to evaluate the vascular anatomy.  Contrast: OMNIPAQUE IOHEXOL 350 MG/ML SOLN  Comparison: Chest x-ray earlier today.  Findings: Bibasilar atelectasis.  Suspected lower lobe pneumonia, worse on the right with air bronchograms  and consolidation.  Good opacification of the pulmonary vasculature without evidence for filling defect to suggest acute PE.  Cardiomegaly.  No pericardial effusion.  Slight right greater than left pleural effusions.  No hilar or mediastinal adenopathy.  Prior median sternotomy for mitral valve replacement.  Unremarkable appearing aorta.  Hepatic steatosis.  Cholecystectomy clips with slight fluid in the gallbladder fossa is incompletely evaluated. No acute osseous abnormality.  IMPRESSION: Bibasilar atelectasis with suspected right greater than left bilateral lower lobe pneumonia.  No evidence for pulmonary embolic disease.   Original Report Authenticated By: Elsie Stain, M.D.      No diagnosis found.    MDM  61 year old female coming in with tachypnea, tachycardia, and shortness of breath. Post surgery 4 days ago his past medical history of DVT. CT angio which was reviewed by myself  shows bilateral lower lobe pneumonia. Patient has severe shortness of breath with exertion. Patient will be admitted by the hospitalist and treated for hospital aquired pneumonia.     Labs Reviewed  BASIC METABOLIC PANEL - Abnormal; Notable for the following:    Calcium 10.6 (*)     GFR calc non Af Amer 65 (*)     GFR calc Af Amer 75 (*)     All other components within normal limits  URINALYSIS, ROUTINE W REFLEX MICROSCOPIC - Abnormal; Notable for the following:    APPearance CLOUDY (*)     Urobilinogen, UA 4.0 (*)     Leukocytes, UA SMALL (*)     All other components within normal limits  URINE MICROSCOPIC-ADD ON - Abnormal; Notable for the following:    Bacteria, UA FEW (*)     All other components within normal limits  D-DIMER, QUANTITATIVE - Abnormal; Notable for the following:    D-Dimer, Quant 5.07 (*)     All other components within normal limits  CBC WITH DIFFERENTIAL  TROPONIN I  CULTURE, BLOOD (ROUTINE X 2)  CULTURE, BLOOD (ROUTINE X 2)           Remi Haggard, NP 12/02/11 0041

## 2011-12-02 NOTE — Progress Notes (Addendum)
Subjective: Patient seen and examined, admitted with ? Pneumonia. Patient has been started on rocephin and zithromax. CXR looks more like fluid overload, she does have h/o diastolic dysfunction. Filed Vitals:   12/02/11 0618  BP: 126/72  Pulse: 85  Temp: 99 F (37.2 C)  Resp: 18    Chest: Clear Bilaterally Heart : S1S2 RRR Abdomen: Soft, nontender Ext : No edema Neuro: Alert, oriented x 3  A/P ? Health care associated Pneumonia Patient was discharged from the hospital on 9/11 Will change the antibiotics to vancomycin, levaquin, cefepime.  Diastolic dysfunction Will start lasix 20 mg IV  q 12 hr  Will repeat CXR in am.    Meredeth Ide [facility]ist/Palliative Medicine Team [phone]

## 2011-12-02 NOTE — H&P (Signed)
PCP:   Pearson Grippe, MD   Chief Complaint:  Shortness of breath  HPI: This is a 61 year female who just left the hospital on Thursday after she had a cholecystectomy and hernia repair. She states she was fine on Friday but on Saturday she developed some shortness of breath. On Friday and Saturday she also developed a cough, this has resolved. On Friday she had some worsening periumbilical discomfort, with the cough. Today her shortness of breath became significantly worse, she came to the ER. She denies any wheezing, chills, nausea, vomiting, diarrhea, burning on urination or altered mentation. She states she had a fever.  Review of Systems:  The patient denies anorexia, weight loss,, vision loss, decreased hearing, hoarseness, chest pain, syncope, dyspnea on exertion, peripheral edema, balance deficits, hemoptysis, abdominal pain, melena, hematochezia, severe indigestion/heartburn, hematuria, incontinence, genital sores, muscle weakness, suspicious skin lesions, transient blindness, difficulty walking, depression, unusual weight change, abnormal bleeding, enlarged lymph nodes, angioedema, and breast masses.  Past Medical History: Past Medical History  Diagnosis Date  . Depression   . HTN (hypertension)   . Rheumatic heart disease     s/p mitral valve repair 1980  . Hypercholesteremia   . Insomnia   . Anxiety   . DVT (deep venous thrombosis)   . Pain, joint, multiple sites   . Fibromyalgia   . Connective tissue disease   . Arthritis   . Heart murmur   . Mitral stenosis   . Personal history of urinary calculi   . PONV (postoperative nausea and vomiting)   . Peripheral vascular disease     hx dvt  . Stones in the urinary tract   . Refusal of blood transfusions as patient is Jehovah's Witness    Past Surgical History  Procedure Date  . Mitral valve repair 1980  . Tubal ligation 1980  . Vaginal hysterectomy 1992    for fibroids -- partial  . Cholecystectomy 11/27/2011   laproscopic  . Hernia repair 11/27/2011    laproscopic  . Cholecystectomy 11/27/2011    Procedure: LAPAROSCOPIC CHOLECYSTECTOMY WITH INTRAOPERATIVE CHOLANGIOGRAM;  Surgeon: Ernestene Mention, MD;  Location: [facility] OR;  Service: General;  Laterality: N/A;  laparoscopic cholecystectomy with choleangiogram umbilical hernia repair  . Umbilical hernia repair 11/27/2011    Procedure: HERNIA REPAIR UMBILICAL ADULT;  Surgeon: Ernestene Mention, MD;  Location: [facility] OR;  Service: General;  Laterality: N/A;    Medications: Prior to Admission medications   Medication Sig Start Date End Date Taking? Authorizing Provider  amLODipine (NORVASC) 5 MG tablet Take 5 mg by mouth every morning. 03/16/11  Yes Thomasene Lot, PA-C  aspirin 325 MG EC tablet Take 325 mg by mouth every morning.    Yes Historical Provider, MD  cholecalciferol (VITAMIN D) 1000 UNITS tablet Take 1,000 Units by mouth 2 (two) times daily.    Yes Historical Provider, MD  Cinnamon 500 MG TABS Take 500 mg by mouth daily.    Yes Historical Provider, MD  clonazePAM (KLONOPIN) 0.5 MG tablet Take 0.5 mg by mouth 2 (two) times daily as needed. For anxiety   Yes Historical Provider, MD  famotidine (PEPCID) 20 MG tablet Take 20 mg by mouth 2 (two) times daily.     Yes Historical Provider, MD  gabapentin (NEURONTIN) 300 MG capsule Take 300 mg by mouth 3 (three) times daily.    Yes Historical Provider, MD  hydrochlorothiazide (MICROZIDE) 12.5 MG capsule Take 12.5 mg by mouth every morning. 04/26/11 04/25/12 Yes Lewayne Bunting,  MD  HYDROcodone-acetaminophen (NORCO/VICODIN) 5-325 MG per tablet Take 1-2 tablets by mouth every 4 (four) hours as needed for pain. 11/28/11 12/08/11 Yes Ernestene Mention, MD  l-methylfolate-B6-B12 (METANX) 3-35-2 MG TABS Take 1 tablet by mouth 2 (two) times daily.    Yes Historical Provider, MD  metoprolol succinate (TOPROL-XL) 50 MG 24 hr tablet Take 50 mg by mouth every morning. Take with or immediately following a meal. 04/26/11 04/25/12 Yes  Lewayne Bunting, MD  omeprazole (PRILOSEC) 20 MG capsule Take 20 mg by mouth every morning.    Yes Historical Provider, MD  traMADol (ULTRAM) 50 MG tablet Take 50 mg by mouth every 6 (six) hours as needed. For pain. Maximum dose= 8 tablets per day for pain.   Yes Historical Provider, MD    Allergies:   Allergies  Allergen Reactions  . Statins Other (See Comments)    Muscle cramps  . Sulfa Antibiotics Nausea And Vomiting    Social History:  reports that she has never smoked. She has never used smokeless tobacco. She reports that she does not drink alcohol or use illicit drugs.  Family History: Family History  Problem Relation Age of Onset  . Lupus    . Kidney failure    . Bone cancer    . Heart disease Mother     MI in her 7s  . ALS Mother   . Kidney disease Father   . Cancer Brother     bone  . Cancer Maternal Uncle     bone    Physical Exam: Filed Vitals:   12/01/11 1859 12/01/11 2156 12/01/11 2341 12/02/11 0045  BP: 142/76 150/81 140/69 136/72  Pulse: 101 96 99 97  Temp: 99.9 F (37.7 C) 99.8 F (37.7 C) 99.5 F (37.5 C)   TempSrc: Oral Oral Oral   Resp: 16 20 27 24   SpO2: 95% 97% 98% 98%    General:  Alert and oriented times three, well developed and nourished, no acute distress Eyes: PERRLA, pink conjunctiva, no scleral icterus ENT: Moist oral mucosa, neck supple, no thyromegaly Lungs: clear to ascultation, no wheeze, mild crackles bilateral bases, no use of accessory muscles Cardiovascular: regular rate and rhythm, no regurgitation, no gallops, no murmurs. No carotid bruits, no JVD Abdomen: soft, positive BS, postop tenderness to palpation, non-distended, no organomegaly, not an acute abdomen, umbilical surgical scar no sign of infection GU: not examined Neuro: CN II - XII grossly intact, sensation intact Musculoskeletal: strength 5/5 all extremities, no clubbing, cyanosis or edema Skin: no rash, no subcutaneous crepitation, no decubitus Psych:  appropriate patient   Labs on Admission:   [facility] 12/01/11 1907  NA 138  K 3.8  CL 98  CO2 26  GLUCOSE 90  BUN 8  CREATININE 0.94  CALCIUM 10.6*  MG --  PHOS --   No results found for this basename: AST:2,ALT:2,ALKPHOS:2,BILITOT:2,PROT:2,ALBUMIN:2 in the last 72 hours No results found for this basename: LIPASE:2,AMYLASE:2 in the last 72 hours  Basename 12/01/11 1907  WBC 7.7  NEUTROABS 5.2  HGB 13.5  HCT 40.1  MCV 81.3  PLT 329    Basename 12/01/11 1907  CKTOTAL --  CKMB --  CKMBINDEX --  TROPONINI <0.30   No components found with this basename: POCBNP:3  Basename 12/01/11 2344  DDIMER 5.07*   No results found for this basename: HGBA1C:2 in the last 72 hours No results found for this basename: CHOL:2,HDL:2,LDLCALC:2,TRIG:2,CHOLHDL:2,LDLDIRECT:2 in the last 72 hours No results found for this basename: TSH,T4TOTAL,FREET3,T3FREE,THYROIDAB in  the last 72 hours No results found for this basename: VITAMINB12:2,FOLATE:2,FERRITIN:2,TIBC:2,IRON:2,RETICCTPCT:2 in the last 72 hours  Micro Results: Recent Results (from the past 240 hour(s))  SURGICAL PCR SCREEN     Status: Normal   Collection Time   11/25/11  9:22 AM      Component Value Range Status Comment   MRSA, PCR NEGATIVE  NEGATIVE Final    Staphylococcus aureus NEGATIVE  NEGATIVE Final   Results for AMAMDA, CURBOW (MRN [id number]) as of 12/02/2011 01:21  Ref. Range 12/01/2011 19:30  Color, Urine Latest Range: YELLOW  YELLOW  APPearance Latest Range: CLEAR  CLOUDY (A)  Specific Gravity, Urine Latest Range: 1.005-1.030  1.015  pH Latest Range: 5.0-8.0  8.0  Glucose Latest Range: NEGATIVE mg/dL NEGATIVE  Bilirubin Urine Latest Range: NEGATIVE  NEGATIVE  Ketones, ur Latest Range: NEGATIVE mg/dL NEGATIVE  Protein Latest Range: NEGATIVE mg/dL NEGATIVE  Urobilinogen, UA Latest Range: 0.0-1.0 mg/dL 4.0 (H)  Nitrite Latest Range: NEGATIVE  NEGATIVE  Leukocytes, UA Latest Range: NEGATIVE  SMALL (A)  Hgb urine  dipstick Latest Range: NEGATIVE  NEGATIVE  WBC, UA Latest Range: <3 WBC/hpf 3-6  RBC / HPF Latest Range: <3 RBC/hpf 0-2  Squamous Epithelial / LPF Latest Range: RARE  RARE  Bacteria, UA Latest Range: RARE  FEW (A)     Radiological Exams on Admission: Dg Chest 2 View  12/01/2011  *RADIOLOGY REPORT*  Clinical Data: Shortness of breath and chest pain.  CHEST - 2 VIEW  Comparison: PA and lateral chest 11/25/2011.  Findings: The patient has a small right pleural effusion and basilar airspace disease.  Lung volumes are low.  No pneumothorax identified.  The patient is status post median sternotomy.  Heart size is upper normal.  IMPRESSION: Small right pleural effusion and basilar airspace disease worrisome for pneumonia.   Original Report Authenticated By: Bernadene Bell. Maricela Curet, M.D.    Ct Angio Chest Pe W/cm &/or Wo Cm  12/01/2011  *RADIOLOGY REPORT*  Clinical Data: Hernia and gallbladder surgery this week.  Shortness of breath.  CT ANGIOGRAPHY CHEST  Technique:  Multidetector CT imaging of the chest using the standard protocol during bolus administration of intravenous contrast. Multiplanar reconstructed images including MIPs were obtained and reviewed to evaluate the vascular anatomy.  Contrast: OMNIPAQUE IOHEXOL 350 MG/ML SOLN  Comparison: Chest x-ray earlier today.  Findings: Bibasilar atelectasis.  Suspected lower lobe pneumonia, worse on the right with air bronchograms and consolidation.  Good opacification of the pulmonary vasculature without evidence for filling defect to suggest acute PE.  Cardiomegaly.  No pericardial effusion.  Slight right greater than left pleural effusions.  No hilar or mediastinal adenopathy.  Prior median sternotomy for mitral valve replacement.  Unremarkable appearing aorta.  Hepatic steatosis.  Cholecystectomy clips with slight fluid in the gallbladder fossa is incompletely evaluated. No acute osseous abnormality.  IMPRESSION: Bibasilar atelectasis with suspected right  greater than left bilateral lower lobe pneumonia.  No evidence for pulmonary embolic disease.   Original Report Authenticated By: Elsie Stain, M.D.     Assessment/Plan Present on Admission:  .Pneumonia admit to MedSurg Will treat patient for hospital-acquired pneumonia as patient just had a cholecystectomy and hernia repair with Dr. Derrell Lolling  Oxygen, nebulizers ordered as needed along with Tessalon Perles  Blood cultures collected  Status post cholecystectomy and hernia repair Defer to a.m. team re consulted Dr. Derrell Lolling  .HYPERCHOLESTEROLEMIA .DEPRESSION .ANXIETY STATE NOS .RHEUMATIC HEART DISEASE .HYPERTENSION .Aortic stenosis  stable resume home medication  Full code  DVT prophylaxis   Clever Geraldo 12/02/2011, 1:21 AM

## 2011-12-03 ENCOUNTER — Inpatient Hospital Stay (HOSPITAL_COMMUNITY): Payer: PRIVATE HEALTH INSURANCE

## 2011-12-03 DIAGNOSIS — I359 Nonrheumatic aortic valve disorder, unspecified: Secondary | ICD-10-CM

## 2011-12-03 LAB — HEPATIC FUNCTION PANEL
Albumin: 3.6 g/dL (ref 3.5–5.2)
Alkaline Phosphatase: 99 U/L (ref 39–117)
Total Bilirubin: 0.2 mg/dL — ABNORMAL LOW (ref 0.3–1.2)
Total Protein: 8.3 g/dL (ref 6.0–8.3)

## 2011-12-03 LAB — BASIC METABOLIC PANEL
Calcium: 10 mg/dL (ref 8.4–10.5)
GFR calc Af Amer: 68 mL/min — ABNORMAL LOW (ref >90)
GFR calc non Af Amer: 58 mL/min — ABNORMAL LOW (ref >90)
Potassium: 3.8 mEq/L (ref 3.5–5.1)
Sodium: 134 mEq/L — ABNORMAL LOW (ref 135–145)

## 2011-12-03 MED ORDER — HYDROCHLOROTHIAZIDE 25 MG PO TABS: 12.5000 mg | ORAL_TABLET | Freq: Every day | ORAL | Status: DC

## 2011-12-03 MED ORDER — FLEET ENEMA 7-19 GM/118ML RE ENEM
1.0000 | ENEMA | Freq: Once | RECTAL | Status: AC
  Administered 2011-12-03: 1 via RECTAL
  Filled 2011-12-03: qty 1

## 2011-12-03 MED ORDER — POLYETHYLENE GLYCOL 3350 17 G PO PACK
17.0000 g | PACK | Freq: Two times a day (BID) | ORAL | Status: DC
  Administered 2011-12-03 – 2011-12-04 (×2): 17 g via ORAL
  Filled 2011-12-03 (×3): qty 1

## 2011-12-03 MED ORDER — FLEET ENEMA 7-19 GM/118ML RE ENEM: 1.0000 | ENEMA | Freq: Once | RECTAL | Status: DC

## 2011-12-03 MED ORDER — DOCUSATE SODIUM 100 MG PO CAPS
200.0000 mg | ORAL_CAPSULE | Freq: Two times a day (BID) | ORAL | Status: DC
  Administered 2011-12-03 – 2011-12-04 (×2): 200 mg via ORAL
  Filled 2011-12-03 (×2): qty 2

## 2011-12-03 MED ORDER — HYDROCHLOROTHIAZIDE 12.5 MG PO CAPS
12.5000 mg | ORAL_CAPSULE | Freq: Every day | ORAL | Status: DC
  Administered 2011-12-04: 12.5 mg via ORAL
  Filled 2011-12-03: qty 1

## 2011-12-03 MED ORDER — POLYETHYLENE GLYCOL 3350 17 G PO PACK
17.0000 g | PACK | Freq: Every day | ORAL | Status: DC
  Administered 2011-12-03: 17 g via ORAL

## 2011-12-03 MED ORDER — HYDROXYZINE HCL 25 MG PO TABS: 25.0000 mg | ORAL_TABLET | Freq: Three times a day (TID) | ORAL | Status: DC | PRN

## 2011-12-03 MED ORDER — POLYETHYLENE GLYCOL 3350 17 G PO PACK
17.0000 g | PACK | Freq: Once | ORAL | Status: AC
  Administered 2011-12-03: 17 g via ORAL
  Filled 2011-12-03: qty 1

## 2011-12-03 NOTE — Clinical Documentation Improvement (Signed)
Abnormal Labs Clarification  THIS DOCUMENT IS NOT A PERMANENT PART OF THE MEDICAL RECORD  TO RESPOND TO THE THIS QUERY, FOLLOW THE INSTRUCTIONS BELOW:  1. If needed, update documentation for the patient's encounter via the notes activity.  2. Access this query again and click edit on the [employer].  3. After updating, or not, click F2 to complete all highlighted (required) fields concerning your review. Select "additional documentation in the medical record" OR "no additional documentation provided".  4. Click Sign note button.  5. The deficiency will fall out of your InBasket *Please let us know if you are not able to complete this workflow by phone or e-mail (listed below).  Please update your documentation within the medical record to reflect your response to this query.                                                                                   12/03/11  Dear Dr. Drusilla Kanner Marton Redwood  In a better effort to capture your patient's severity of illness, reflect appropriate length of stay and utilization of resources, a review of the medical record has revealed the following indicators.    Based on your clinical judgment, please clarify and document in a progress note and/or discharge summary the clinical condition associated with the following supporting information:  In responding to this query please exercise your independent judgment.  The fact that a query is asked, does not imply that any particular answer is desired or expected.  Abnormal findings (laboratory, x-ray, pathologic, and other diagnostic results) are not coded and reported unless the physician indicates their clinical significance.   The medical record reflects the following clinical findings, please clarify the diagnostic and/or clinical significance:      Noted on chest xray per radiologist impression " small right pleural effusion" and "Bibasilar Atelectasis" if these are appropriate secondary diagnoses  please document in progress notes.  Thank you    Possible Clinical Conditions?                                  Small right pleural effusion  Bibasilar atelectasis   Other Condition:           Cannot Clinically Determine:  Treatment:  Tessalon pearls prn, Atrovent/IProventil nebs prn,  Evaluation:  Chest x ray 9/15, 917   Reviewed:doc in d/c summary pleural effusion on 9/17 per chest xray    Thank You,  Leonette Most Miciah Covelli  Clinical Documentation Specialist RN, BSN: Pager [phone]  [employer] # [phone]  [facility] [place]

## 2011-12-03 NOTE — Progress Notes (Addendum)
Subjective: Patient seen, breathing better. Complains of constipation.  Objective: Vital signs in last 24 hours: Temp:  [97.8 F (36.6 C)] 97.8 F (36.6 C) (09/17 0716) Pulse Rate:  [86-87] 87  (09/17 0716) Resp:  [18] 18  (09/17 0716) BP: (124-125)/(75-77) 125/75 mmHg (09/17 0716) SpO2:  [98 %-100 %] 100 % (09/17 0716) Weight change:  Last BM Date: 11/24/11  Intake/Output from previous day: 09/16 0701 - 09/17 0700 In: 753 [P.O.:150; I.V.:3; IV Piggyback:600] Out: -  Total I/O In: 123 [P.O.:120; I.V.:3] Out: -    Physical Exam: Head: Normocephalic, atraumatic.  Eyes: No signs of jaundice, EOMI Nose: Mucous membranes dry.  Throat: Oropharynx nonerythematous, no exudate appreciated.  Neck: supple,No deformities, masses, or tenderness noted. Lungs: Normal respiratory effort. B/L Clear to auscultation, no crackles or wheezes.  Heart: Regular RR. S1 and S2 normal  Abdomen: BS normoactive. Soft, Nondistended, non-tender.  Extremities: No pretibial edema, no erythema   Lab Results: Basic Metabolic Panel:  Basename 12/03/11 0639 12/02/11 0253  NA 134* 134*  K 3.8 3.8  CL 97 98  CO2 25 23  GLUCOSE 133* 131*  BUN 10 6  CREATININE 1.03 0.91  CALCIUM 10.0 10.2  MG -- --  PHOS -- --   Liver Function Tests: No results found for this basename: AST:2,ALT:2,ALKPHOS:2,BILITOT:2,PROT:2,ALBUMIN:2 in the last 72 hours No results found for this basename: LIPASE:2,AMYLASE:2 in the last 72 hours No results found for this basename: AMMONIA:2 in the last 72 hours CBC:  Basename 12/02/11 0253 12/01/11 1907  WBC 8.3 7.7  NEUTROABS -- 5.2  HGB 13.0 13.5  HCT 38.0 40.1  MCV 80.5 81.3  PLT 302 329   Cardiac Enzymes:  Basename 12/01/11 1907  CKTOTAL --  CKMB --  CKMBINDEX --  TROPONINI <0.30   BNP:  Basename 12/02/11 1129  PROBNP 497.9*   D-Dimer:  Alvira Philips 12/01/11 2344  DDIMER 5.07*   CBG: No results found for this basename: GLUCAP:6 in the last 72  hours Hemoglobin A1C: No results found for this basename: HGBA1C in the last 72 hours Fasting Lipid Panel: No results found for this basename: CHOL,HDL,LDLCALC,TRIG,CHOLHDL,LDLDIRECT in the last 72 hours Thyroid Function Tests: No results found for this basename: TSH,T4TOTAL,FREET4,T3FREE,THYROIDAB in the last 72 hours Anemia Panel: No results found for this basename: VITAMINB12,FOLATE,FERRITIN,TIBC,IRON,RETICCTPCT in the last 72 hours Coagulation: No results found for this basename: LABPROT:2,INR:2 in the last 72 hours Urine Drug Screen: Drugs of Abuse     Component Value Date/Time   LABOPIA NONE DETECTED 03/16/2011 1321   COCAINSCRNUR NONE DETECTED 03/16/2011 1321   LABBENZ POSITIVE* 03/16/2011 1321   AMPHETMU NONE DETECTED 03/16/2011 1321   THCU NONE DETECTED 03/16/2011 1321   LABBARB NONE DETECTED 03/16/2011 1321    Alcohol Level: No results found for this basename: ETH:2 in the last 72 hours Urinalysis:  Basename 12/01/11 1930  COLORURINE YELLOW  LABSPEC 1.015  PHURINE 8.0  GLUCOSEU NEGATIVE  HGBUR NEGATIVE  BILIRUBINUR NEGATIVE  KETONESUR NEGATIVE  PROTEINUR NEGATIVE  UROBILINOGEN 4.0*  NITRITE NEGATIVE  LEUKOCYTESUR SMALL*   Misc. Labs:  Recent Results (from the past 240 hour(s))  SURGICAL PCR SCREEN     Status: Normal   Collection Time   11/25/11  9:22 AM      Component Value Range Status Comment   MRSA, PCR NEGATIVE  NEGATIVE Final    Staphylococcus aureus NEGATIVE  NEGATIVE Final   CULTURE, BLOOD (ROUTINE X 2)     Status: Normal (Preliminary result)   Collection Time  12/02/11 12:45 AM      Component Value Range Status Comment   Specimen Description BLOOD RIGHT HAND   Final    Special Requests BOTTLES DRAWN AEROBIC ONLY 8CC   Final    Culture  Setup Time 12/02/2011 14:17   Final    Culture     Final    Value:        BLOOD CULTURE RECEIVED NO GROWTH TO DATE CULTURE WILL BE HELD FOR 5 DAYS BEFORE ISSUING A FINAL NEGATIVE REPORT   Report Status PENDING    Incomplete   CULTURE, BLOOD (ROUTINE X 2)     Status: Normal (Preliminary result)   Collection Time   12/02/11 12:50 AM      Component Value Range Status Comment   Specimen Description BLOOD LEFT ARM   Final    Special Requests BOTTLES DRAWN AEROBIC AND ANAEROBIC 6CC EA   Final    Culture  Setup Time 12/02/2011 14:16   Final    Culture     Final    Value:        BLOOD CULTURE RECEIVED NO GROWTH TO DATE CULTURE WILL BE HELD FOR 5 DAYS BEFORE ISSUING A FINAL NEGATIVE REPORT   Report Status PENDING   Incomplete     Studies/Results: Dg Chest 2 View  12/03/2011  *RADIOLOGY REPORT*  Clinical Data: Pneumonia  CHEST - 2 VIEW  Comparison: 12/01/2011  Findings: Right basilar airspace disease has improved.  Right pleural effusion has improved.  Minimal linear atelectasis at the left base.  Upper normal heart size.  Postoperative changes. Pneumothorax.  IMPRESSION: Improved right lower lobe pneumonia and right pleural effusion. There is persistent disease.   Original Report Authenticated By: Donavan Burnet, M.D.    Dg Chest 2 View  12/01/2011  *RADIOLOGY REPORT*  Clinical Data: Shortness of breath and chest pain.  CHEST - 2 VIEW  Comparison: PA and lateral chest 11/25/2011.  Findings: The patient has a small right pleural effusion and basilar airspace disease.  Lung volumes are low.  No pneumothorax identified.  The patient is status post median sternotomy.  Heart size is upper normal.  IMPRESSION: Small right pleural effusion and basilar airspace disease worrisome for pneumonia.   Original Report Authenticated By: Bernadene Bell. Maricela Curet, M.D.    Ct Angio Chest Pe W/cm &/or Wo Cm  12/01/2011  *RADIOLOGY REPORT*  Clinical Data: Hernia and gallbladder surgery this week.  Shortness of breath.  CT ANGIOGRAPHY CHEST  Technique:  Multidetector CT imaging of the chest using the standard protocol during bolus administration of intravenous contrast. Multiplanar reconstructed images including MIPs were obtained and  reviewed to evaluate the vascular anatomy.  Contrast: OMNIPAQUE IOHEXOL 350 MG/ML SOLN  Comparison: Chest x-ray earlier today.  Findings: Bibasilar atelectasis.  Suspected lower lobe pneumonia, worse on the right with air bronchograms and consolidation.  Good opacification of the pulmonary vasculature without evidence for filling defect to suggest acute PE.  Cardiomegaly.  No pericardial effusion.  Slight right greater than left pleural effusions.  No hilar or mediastinal adenopathy.  Prior median sternotomy for mitral valve replacement.  Unremarkable appearing aorta.  Hepatic steatosis.  Cholecystectomy clips with slight fluid in the gallbladder fossa is incompletely evaluated. No acute osseous abnormality.  IMPRESSION: Bibasilar atelectasis with suspected right greater than left bilateral lower lobe pneumonia.  No evidence for pulmonary embolic disease.   Original Report Authenticated By: Elsie Stain, M.D.     Medications: Scheduled Meds:   . amLODipine  5 mg Oral q morning - 10a  . aspirin  325 mg Oral q morning - 10a  . ceFEPime (MAXIPIME) IV  1 g Intravenous Q12H  . enoxaparin (LOVENOX) injection  40 mg Subcutaneous Q24H  . furosemide  20 mg Intravenous Q12H  . gabapentin  300 mg Oral TID  . levofloxacin (LEVAQUIN) IV  750 mg Intravenous Q24H  . metoprolol succinate  50 mg Oral q morning - 10a  . pantoprazole  40 mg Oral Q1200  . pneumococcal 23 valent vaccine  0.5 mL Intramuscular Tomorrow-1000  . polyethylene glycol  17 g Oral Once  . polyethylene glycol  17 g Oral Daily  . sodium chloride  3 mL Intravenous Q12H  . sodium phosphate  1 enema Rectal Once  . vancomycin  1,000 mg Intravenous Q12H  . DISCONTD: azithromycin  500 mg Intravenous Q24H  . DISCONTD: cefTRIAXone (ROCEPHIN)  IV  1 g Intravenous Q24H  . DISCONTD: furosemide  40 mg Oral Daily   Continuous Infusions:  PRN Meds:.sodium chloride, acetaminophen, acetaminophen, albuterol, alum & mag hydroxide-simeth,  benzonatate, bisacodyl, clonazePAM, HYDROcodone-acetaminophen, hydrOXYzine, ipratropium, ondansetron (ZOFRAN) IV, ondansetron, senna-docusate, sodium chloride, zolpidem  Assessment/Plan:  Active Problems:  HYPERCHOLESTEROLEMIA  ANXIETY STATE NOS  DEPRESSION  RHEUMATIC HEART DISEASE  HYPERTENSION  Aortic stenosis  Pneumonia    A/P ? Health care associated Pneumonia  doubt pneumonia as she improved with excellent diuresis with lasix She has been afebrile, WBC are normal; Blood cultures negative so far. Patient was discharged from the hospital on 9/11 On  vancomycin, levaquin, cefepime. May change to levaquin in am.  Diastolic dysfunction BNP is 497 Good diuresis with lasix Will change the lasix to hydrochlorothiazide 12.5 mg po daily from am.   Constipation Not improved with dulcolax suppository and miralax Will get abdomen xray x 2 view  DVT prophylaxis Lovenox  Anticipate d/c home in am if stable on po Levaquin. Meredeth Ide [facility] [phone]

## 2011-12-03 NOTE — Progress Notes (Signed)
Patient ID: Alexandria Leach, female   DOB: [birth year], 61 y.o.   MRN: [id number] Patient is post operative from Laparoscopic cholecystectomy with cholangiogram, repair incarcerated incisional hernia on 9/11.  She is currently admitted due to respiratory issues.  She has been having problems with constipation but has actually now had a large BM after fleets enema.  Per nurses the [facility] was very large and that the patient felt much better after.  Patient is currently off floor for abdominal x-rays.  Continue stool softeners and laxatives.  Will reorder fleets for later today.  Will see patient in AM.  [name] 4:14 PM 12/03/2011

## 2011-12-04 DIAGNOSIS — I5030 Unspecified diastolic (congestive) heart failure: Secondary | ICD-10-CM

## 2011-12-04 DIAGNOSIS — I509 Heart failure, unspecified: Secondary | ICD-10-CM

## 2011-12-04 LAB — BASIC METABOLIC PANEL
Calcium: 10.2 mg/dL (ref 8.4–10.5)
GFR calc non Af Amer: 57 mL/min — ABNORMAL LOW (ref >90)
Glucose, Bld: 106 mg/dL — ABNORMAL HIGH (ref 70–99)
Potassium: 4 mEq/L (ref 3.5–5.1)
Sodium: 136 mEq/L (ref 135–145)

## 2011-12-04 MED ORDER — LEVOFLOXACIN 750 MG PO TABS: 750.0000 mg | ORAL_TABLET | ORAL | Status: DC

## 2011-12-04 MED ORDER — POLYETHYLENE GLYCOL 3350 17 G PO PACK: 17.0000 g | PACK | Freq: Two times a day (BID) | ORAL | Status: DC

## 2011-12-04 MED ORDER — LEVOFLOXACIN 750 MG PO TABS
750.0000 mg | ORAL_TABLET | ORAL | Status: DC
  Filled 2011-12-04: qty 1

## 2011-12-04 MED ORDER — FUROSEMIDE 20 MG PO TABS: 20.0000 mg | ORAL_TABLET | Freq: Every day | ORAL | Status: DC

## 2011-12-04 MED ORDER — DSS 100 MG PO CAPS
100.0000 mg | ORAL_CAPSULE | Freq: Two times a day (BID) | ORAL | Status: DC
Start: 2011-12-04 — End: 2012-09-10

## 2011-12-04 NOTE — Discharge Summary (Signed)
Physician Discharge Summary  Alexandria Leach HQI:[id number] DOB: October 18, 1950 DOA: 12/01/2011  PCP: Pearson Grippe, MD  Admit date: 12/01/2011 Discharge date: 12/04/2011  Recommendations for Outpatient Follow-up:  1. Pt will need to follow up with PCP in 1 week  Discharge Diagnoses:   acute CHF, diastolic -Ejection fraction 60-65% Pulmonary infiltrates -Plan discharge home with Levaquin x4 more days Rheumatic heart disease -Status post mitral valve repair 1980 Hypertension -Controlled  Discharge Condition: Stable  Disposition: d/c to home  Diet: Cardiac diet Wt Readings from Last 3 Encounters:  12/02/11 80.74 kg (178 lb)  11/27/11 84.3 kg (185 lb 13.6 oz)  11/27/11 84.3 kg (185 lb 13.6 oz)    History of present illness:  This is a 61 year female who just left the hospital on Thursday after she had a cholecystectomy and hernia repair. She states she was fine on Friday but on Saturday she developed some shortness of breath. On Friday and Saturday she also developed a cough, this has resolved. On Friday she had some worsening periumbilical discomfort, with the cough. Today (9/15) her shortness of breath became significantly worse, she came to the ER. She denies any wheezing, chills, nausea, vomiting, diarrhea, burning on urination or altered mentation. She states she had a fever.   Hospital Course:  The patient was admitted to MedSurg and started on intravenous antibiotics. The patient was initially given ceftriaxone and azithromycin. This was discontinued in favor of vancomycin, cefepime, and Levaquin. The patient never developed a fever greater than 100.0. In addition, the patient never had any leukocytosis. A decision was made to start the patient on furosemide 20 mg IV every 12 hours on 12/02/2011. The patient's clinical symptoms significantly improved. Repeat chest x-ray on September 17 reveals improvement of the patient's right pleural effusion as well as infiltrate. Given the rapidity  of the changes and clinical improvement, it was thought the patient more likely had fluid overload as opposed to pneumonia. However given the patient's subjective complaint of fever, the patient's antibiotics were continued. She will be discharged home with Levaquin for 4 additional days by mouth. Patient had echocardiogram on 12/11/2011 which revealed an ejection fraction of 60-65% with grade 2 diastolic dysfunction. The patient was continued on her metoprolol succinate, and her blood pressure was well maintained. The patient complained of abdominal pain and constipation. Abdominal x-rays were obtained at and revealed a nonobstructive pattern. The patient was given MiraLax as well as a fleets enema with results of a bowel movement. This improved her abdominal pain. Surgery was consulted and did not recommend any further intervention given her improvement. She was instructed to continue on MiraLax and daily Colace as an outpatient. The patient will be discharged home on amlodipine 5 mg, aspirin 325 mg, Lasix 20 mg daily, and metoprolol succinate 50 mg daily.  Consultants: General surgery  Discharge Exam: Filed Vitals:   12/04/11 0843  BP: 133/75  Pulse: 84  Temp:   Resp:    Filed Vitals:   12/03/11 1450 12/03/11 2114 12/04/11 0548 12/04/11 0843  BP: 133/87 125/69 128/77 133/75  Pulse: 108 92 98 84  Temp: 98.1 F (36.7 C) 99.3 F (37.4 C) 99.1 F (37.3 C)   TempSrc:      Resp: 18 18 18    Height:      Weight:      SpO2: 98% 99% 99%    General: A&O x 3, NAD, pleasant, cooperative Cardiovascular: RRR, no rub, no gallop, no S3 Respiratory: Bibasilar crackles, no wheeze, no  rhonchi Abdomen:soft, nontender, nondistended, positive bowel sounds Extremities: No cyanosis, edema, lymphangitis, petechiae  Discharge Instructions      Discharge Orders    Future Appointments: Provider: Department: Dept Phone: Center:   12/12/2011 8:15 AM Ernestene Mention, MD [facility] [phone] None      Future Orders Please Complete By Expires   Diet - low sodium heart healthy      Increase activity slowly          Medication List     As of 12/04/2011  3:09 PM    STOP taking these medications         hydrochlorothiazide 12.5 MG capsule   Commonly known as: MICROZIDE      TAKE these medications         amLODipine 5 MG tablet   Commonly known as: NORVASC   Take 5 mg by mouth every morning.      aspirin 325 MG EC tablet   Take 325 mg by mouth every morning.      cholecalciferol 1000 UNITS tablet   Commonly known as: VITAMIN D   Take 1,000 Units by mouth 2 (two) times daily.      Cinnamon 500 MG Tabs   Take 500 mg by mouth daily.      clonazePAM 0.5 MG tablet   Commonly known as: KLONOPIN   Take 0.5 mg by mouth 2 (two) times daily as needed. For anxiety      DSS 100 MG Caps   Take 100 mg by mouth 2 (two) times daily.      famotidine 20 MG tablet   Commonly known as: PEPCID   Take 20 mg by mouth 2 (two) times daily.      furosemide 20 MG tablet   Commonly known as: LASIX   Take 1 tablet (20 mg total) by mouth daily.      gabapentin 300 MG capsule   Commonly known as: NEURONTIN   Take 300 mg by mouth 3 (three) times daily.      HYDROcodone-acetaminophen 5-325 MG per tablet   Commonly known as: NORCO/VICODIN   Take 1-2 tablets by mouth every 4 (four) hours as needed for pain.      l-methylfolate-B6-B12 3-35-2 MG Tabs   Commonly known as: METANX   Take 1 tablet by mouth 2 (two) times daily.      levofloxacin 750 MG tablet   Commonly known as: LEVAQUIN   Take 1 tablet (750 mg total) by mouth daily.      metoprolol succinate 50 MG 24 hr tablet   Commonly known as: TOPROL-XL   Take 50 mg by mouth every morning. Take with or immediately following a meal.      omeprazole 20 MG capsule   Commonly known as: PRILOSEC   Take 20 mg by mouth every morning.      polyethylene glycol packet   Commonly known as: MIRALAX / GLYCOLAX   Take 17 g by mouth 2 (two)  times daily.      traMADol 50 MG tablet   Commonly known as: ULTRAM   Take 50 mg by mouth every 6 (six) hours as needed. For pain. Maximum dose= 8 tablets per day for pain.          The results of significant diagnostics from this hospitalization (including imaging, microbiology, ancillary and laboratory) are listed below for reference.    Significant Diagnostic Studies: Dg Chest 2 View  12/03/2011  *RADIOLOGY REPORT*  Clinical Data: Pneumonia  CHEST - 2 VIEW  Comparison: 12/01/2011  Findings: Right basilar airspace disease has improved.  Right pleural effusion has improved.  Minimal linear atelectasis at the left base.  Upper normal heart size.  Postoperative changes. Pneumothorax.  IMPRESSION: Improved right lower lobe pneumonia and right pleural effusion. There is persistent disease.   Original Report Authenticated By: Donavan Burnet, M.D.    Dg Chest 2 View  12/01/2011  *RADIOLOGY REPORT*  Clinical Data: Shortness of breath and chest pain.  CHEST - 2 VIEW  Comparison: PA and lateral chest 11/25/2011.  Findings: The patient has a small right pleural effusion and basilar airspace disease.  Lung volumes are low.  No pneumothorax identified.  The patient is status post median sternotomy.  Heart size is upper normal.  IMPRESSION: Small right pleural effusion and basilar airspace disease worrisome for pneumonia.   Original Report Authenticated By: Bernadene Bell. Maricela Curet, M.D.    Dg Chest 2 View  11/25/2011  *RADIOLOGY REPORT*  Clinical Data: Laparoscopic cholecystectomy.  Preop.  CHEST - 2 VIEW  Comparison: 04/04/2010  Findings: Post sternotomy changes.  Low volumes.  Mild hypoaeration at the lung bases.  No pneumothorax and no pleural effusion.  No consolidation.  Stable thoracic spine.  IMPRESSION: No active cardiopulmonary disease.   Original Report Authenticated By: Donavan Burnet, M.D.    Dg Cholangiogram Operative  11/27/2011  *RADIOLOGY REPORT*  Clinical Data:   Cholelithiasis.   INTRAOPERATIVE CHOLANGIOGRAM  Technique:  Cholangiographic images from the C-arm fluoroscopic device were submitted for interpretation post-operatively.  Please see the procedural report for the amount of contrast and the fluoroscopy time utilized.  Comparison:  Ultrasound 10/21/2011  Findings:  The gallbladder has been removed and the cystic duct cannulated.  There is good opacification of the biliary tree without filling defects or obstruction.  IMPRESSION: Negative operative cholangiogram.   Original Report Authenticated By: Elsie Stain, M.D.    Ct Angio Chest Pe W/cm &/or Wo Cm  12/01/2011  *RADIOLOGY REPORT*  Clinical Data: Hernia and gallbladder surgery this week.  Shortness of breath.  CT ANGIOGRAPHY CHEST  Technique:  Multidetector CT imaging of the chest using the standard protocol during bolus administration of intravenous contrast. Multiplanar reconstructed images including MIPs were obtained and reviewed to evaluate the vascular anatomy.  Contrast: OMNIPAQUE IOHEXOL 350 MG/ML SOLN  Comparison: Chest x-ray earlier today.  Findings: Bibasilar atelectasis.  Suspected lower lobe pneumonia, worse on the right with air bronchograms and consolidation.  Good opacification of the pulmonary vasculature without evidence for filling defect to suggest acute PE.  Cardiomegaly.  No pericardial effusion.  Slight right greater than left pleural effusions.  No hilar or mediastinal adenopathy.  Prior median sternotomy for mitral valve replacement.  Unremarkable appearing aorta.  Hepatic steatosis.  Cholecystectomy clips with slight fluid in the gallbladder fossa is incompletely evaluated. No acute osseous abnormality.  IMPRESSION: Bibasilar atelectasis with suspected right greater than left bilateral lower lobe pneumonia.  No evidence for pulmonary embolic disease.   Original Report Authenticated By: Elsie Stain, M.D.    Dg Abd 2 Views  12/03/2011  *RADIOLOGY REPORT*  Clinical Data: Constipation  ABDOMEN  - 2 VIEW  Comparison: 10/01/2011  Findings: Partially imaged sternotomy wires.  See contemporaneous chest report regarding the lung base process sees.  Surgical clips right upper quadrant.  No free intraperitoneal air. The bowel gas pattern is non-obstructive. Organ outlines are normal where seen. No acute or aggressive osseous abnormality identified.  IMPRESSION: Nonobstructive bowel  gas pattern.   Original Report Authenticated By: Waneta Martins, M.D.      Microbiology: Recent Results (from the past 240 hour(s))  SURGICAL PCR SCREEN     Status: Normal   Collection Time   11/25/11  9:22 AM      Component Value Range Status Comment   MRSA, PCR NEGATIVE  NEGATIVE Final    Staphylococcus aureus NEGATIVE  NEGATIVE Final   CULTURE, BLOOD (ROUTINE X 2)     Status: Normal (Preliminary result)   Collection Time   12/02/11 12:45 AM      Component Value Range Status Comment   Specimen Description BLOOD RIGHT HAND   Final    Special Requests BOTTLES DRAWN AEROBIC ONLY 8CC   Final    Culture  Setup Time 12/02/2011 14:17   Final    Culture     Final    Value:        BLOOD CULTURE RECEIVED NO GROWTH TO DATE CULTURE WILL BE HELD FOR 5 DAYS BEFORE ISSUING A FINAL NEGATIVE REPORT   Report Status PENDING   Incomplete   CULTURE, BLOOD (ROUTINE X 2)     Status: Normal (Preliminary result)   Collection Time   12/02/11 12:50 AM      Component Value Range Status Comment   Specimen Description BLOOD LEFT ARM   Final    Special Requests BOTTLES DRAWN AEROBIC AND ANAEROBIC 6CC EA   Final    Culture  Setup Time 12/02/2011 14:16   Final    Culture     Final    Value:        BLOOD CULTURE RECEIVED NO GROWTH TO DATE CULTURE WILL BE HELD FOR 5 DAYS BEFORE ISSUING A FINAL NEGATIVE REPORT   Report Status PENDING   Incomplete      Labs: Basic Metabolic Panel:  Lab 12/04/11 1610 12/03/11 0639 12/02/11 0253 12/01/11 1907  NA 136 134* 134* 138  K 4.0 3.8 -- --  CL 99 97 98 98  CO2 25 25 23 26   GLUCOSE 106*  133* 131* 90  BUN 11 10 6 8   CREATININE 1.05 1.03 0.91 0.94  CALCIUM 10.2 10.0 10.2 10.6*  MG -- -- -- --  PHOS -- -- -- --   Liver Function Tests:  Lab 12/03/11 1549  AST 77*  ALT 73*  ALKPHOS 99  BILITOT 0.2*  PROT 8.3  ALBUMIN 3.6    Lab 12/03/11 1549  LIPASE 19  AMYLASE --   No results found for this basename: AMMONIA:5 in the last 168 hours CBC:  Lab 12/02/11 0253 12/01/11 1907  WBC 8.3 7.7  NEUTROABS -- 5.2  HGB 13.0 13.5  HCT 38.0 40.1  MCV 80.5 81.3  PLT 302 329   Cardiac Enzymes:  Lab 12/01/11 1907  CKTOTAL --  CKMB --  CKMBINDEX --  TROPONINI <0.30   BNP: No components found with this basename: POCBNP:5 CBG: No results found for this basename: GLUCAP:5 in the last 168 hours  Time coordinating discharge:  Greater than 30 minutes  Signed:  Maceo Hernan, DO [facility] Pager: [phone] 12/04/2011, 3:09 PM

## 2011-12-04 NOTE — Progress Notes (Signed)
Per on call MD, we are to leave IV out until ABT TX is confirmed by MD. Nursing will continue to monitor for status changes.

## 2011-12-04 NOTE — Progress Notes (Signed)
Pt refused 2000 scheduled fleets enema d/t having large BM "less than an hour ago, again".

## 2011-12-04 NOTE — Plan of Care (Signed)
Problem: Discharge Progression Outcomes Goal: Vaccine documented on D/C instructions Outcome: Not Applicable Date Met:  12/04/11 Pt refused vaccine this admit  Comments:  Pt refused vaccine

## 2011-12-04 NOTE — Progress Notes (Signed)
Pt's had c/o pain at IV site.  Noted edema at site.  IV cath discontinued, pt tolerated well.  Pt stated "I do not want to be stuck at this time, the Dr said a PICC line maybe needed so I will wait to see what he says in the morning."  CN aware.

## 2011-12-04 NOTE — Progress Notes (Signed)
Subjective: Dr. Sharl Leach asked me to evaluate this patient for constipation and abdominal pain.  This patient underwent elective laparoscopic cholecystectomy with cholangiogram and repair of umbilical hernia last week. Postop course was uneventful and she was discharged home the next day.  She had to be readmitted this weekend for pneumonia and possible heart failure. She is improving. She is chronically constipated and takes enemas at home from time to time. She got constipated again. Yesterday she had 3 bowel movements and feels much better now. She has been tolerating a regular diet. Abdominal x-rays show nonstructural bowel gas pattern. Lab work is unremarkable. AST and ALT are slightly elevated, not unusual for postop inflammatory change. She has an appointment to see me in the office for postop check next week.  Objective: Vital signs in last 24 hours: Temp:  [97.8 F (36.6 C)-99.3 F (37.4 C)] 99.1 F (37.3 C) (09/18 0548) Pulse Rate:  [87-108] 98  (09/18 0548) Resp:  [18] 18  (09/18 0548) BP: (125-133)/(69-87) 128/77 mmHg (09/18 0548) SpO2:  [98 %-100 %] 99 % (09/18 0548) Last BM Date: 12/03/11  Intake/Output from previous day: 09/17 0701 - 09/18 0700 In: 993 [P.O.:990; I.V.:3] Out: -  Intake/Output this shift: Total I/O In: 720 [P.O.:720] Out: -   General appearance: alert. Mental status normal. In no distress. Good spirits. Cooperative. GI: abdomen is soft, nontender, nondistended, active bowel sounds. Abdominal incisions are healing uneventfully without surgical complication.  Lab Results:  Results for orders placed during the hospital encounter of 12/01/11 (from the past 24 hour(s))  BASIC METABOLIC PANEL     Status: Abnormal   Collection Time   12/03/11  6:39 AM      Component Value Range   Sodium 134 (*) 135 - 145 mEq/L   Potassium 3.8  3.5 - 5.1 mEq/L   Chloride 97  96 - 112 mEq/L   CO2 25  19 - 32 mEq/L   Glucose, Bld 133 (*) 70 - 99 mg/dL   BUN 10  6 - 23  mg/dL   Creatinine, Ser 1.30  0.50 - 1.10 mg/dL   Calcium 86.5  8.4 - 78.4 mg/dL   GFR calc non Af Amer 58 (*) >90 mL/min   GFR calc Af Amer 68 (*) >90 mL/min  LIPASE, BLOOD     Status: Normal   Collection Time   12/03/11  3:49 PM      Component Value Range   Lipase 19  11 - 59 U/L  HEPATIC FUNCTION PANEL     Status: Abnormal   Collection Time   12/03/11  3:49 PM      Component Value Range   Total Protein 8.3  6.0 - 8.3 g/dL   Albumin 3.6  3.5 - 5.2 g/dL   AST 77 (*) 0 - 37 U/L   ALT 73 (*) 0 - 35 U/L   Alkaline Phosphatase 99  39 - 117 U/L   Total Bilirubin 0.2 (*) 0.3 - 1.2 mg/dL   Bilirubin, Direct <6.9  0.0 - 0.3 mg/dL   Indirect Bilirubin NOT CALCULATED  0.3 - 0.9 mg/dL     Studies/Results: @RISRSLT24 @     . amLODipine  5 mg Oral q morning - 10a  . aspirin  325 mg Oral q morning - 10a  . ceFEPime (MAXIPIME) IV  1 g Intravenous Q12H  . docusate sodium  200 mg Oral BID  . enoxaparin (LOVENOX) injection  40 mg Subcutaneous Q24H  . furosemide  20 mg Intravenous Q12H  .  gabapentin  300 mg Oral TID  . hydrochlorothiazide  12.5 mg Oral Daily  . levofloxacin (LEVAQUIN) IV  750 mg Intravenous Q24H  . metoprolol succinate  50 mg Oral q morning - 10a  . pantoprazole  40 mg Oral Q1200  . pneumococcal 23 valent vaccine  0.5 mL Intramuscular Tomorrow-1000  . polyethylene glycol  17 g Oral Once  . polyethylene glycol  17 g Oral BID  . sodium chloride  3 mL Intravenous Q12H  . sodium phosphate  1 enema Rectal Once  . sodium phosphate  1 enema Rectal Once  . vancomycin  1,000 mg Intravenous Q12H  . DISCONTD: furosemide  40 mg Oral Daily  . DISCONTD: hydrochlorothiazide  12.5 mg Oral Daily  . DISCONTD: polyethylene glycol  17 g Oral Daily     Assessment/Plan:  POD #7. Laparoscopic cholecystectomy with cholangiogram and umbilical hernia repair  Acute and chronic constipation. Currently resolved and asymptomatic. There is no evidence of surgical complication from her  cholecystectomy and umbilical hernia repair she is now  on daily Colace and daily MiraLAX, and I would continue that postdischarge. She will see me in the office next week for postop visit.  Possible pneumonia, improving on current antibiotic therapy  Congestive heart failure and diastolic dysfunction with BNP 161. Improved following diuresis with Lasix.  Rheumatic heart disease with history of mitral valve replacement, followed by Dr. Olga Millers. Evaluated recently prior to her cholecystectomy.  Hypertension  Fibromyalgia   Will sign off for now. Please call me if surgical problems arise.    LOS: 3 days    Oseph Imburgia M. Derrell Lolling, M.D., [facility] General and Minimally invasive Surgery Breast and Colorectal Surgery Office:   [phone] Pager:   [phone]  12/04/2011  . .prob

## 2011-12-06 ENCOUNTER — Encounter: Payer: Self-pay | Admitting: Nurse Practitioner

## 2011-12-06 ENCOUNTER — Ambulatory Visit (INDEPENDENT_AMBULATORY_CARE_PROVIDER_SITE_OTHER): Payer: PRIVATE HEALTH INSURANCE | Admitting: Nurse Practitioner

## 2011-12-06 ENCOUNTER — Telehealth: Payer: Self-pay | Admitting: *Deleted

## 2011-12-06 VITALS — BP 150/70 | HR 94 | Resp 20 | Ht 61.0 in | Wt 175.0 lb

## 2011-12-06 DIAGNOSIS — R002 Palpitations: Secondary | ICD-10-CM

## 2011-12-06 DIAGNOSIS — R0602 Shortness of breath: Secondary | ICD-10-CM

## 2011-12-06 LAB — HEPATIC FUNCTION PANEL
ALT: 51 U/L — ABNORMAL HIGH (ref 0–35)
AST: 42 U/L — ABNORMAL HIGH (ref 0–37)
Albumin: 3.9 g/dL (ref 3.5–5.2)
Alkaline Phosphatase: 75 U/L (ref 39–117)
Bilirubin, Direct: 0 mg/dL (ref 0.0–0.3)
Total Bilirubin: 0.5 mg/dL (ref 0.3–1.2)
Total Protein: 8.5 g/dL — ABNORMAL HIGH (ref 6.0–8.3)

## 2011-12-06 LAB — BASIC METABOLIC PANEL
BUN: 11 mg/dL (ref 6–23)
CO2: 25 mEq/L (ref 19–32)
Calcium: 10 mg/dL (ref 8.4–10.5)
Chloride: 102 mEq/L (ref 96–112)
Creatinine, Ser: 1.1 mg/dL (ref 0.4–1.2)
GFR: 68.15 mL/min (ref >60.00)
Glucose, Bld: 116 mg/dL — ABNORMAL HIGH (ref 70–99)
Potassium: 4.2 mEq/L (ref 3.5–5.1)
Sodium: 136 mEq/L (ref 135–145)

## 2011-12-06 LAB — CBC WITH DIFFERENTIAL/PLATELET
Basophils Absolute: 0 10*3/uL (ref 0.0–0.1)
Basophils Relative: 0.6 % (ref 0.0–3.0)
Eosinophils Absolute: 0.3 10*3/uL (ref 0.0–0.7)
Eosinophils Relative: 3.4 % (ref 0.0–5.0)
HCT: 38.3 % (ref 36.0–46.0)
Hemoglobin: 12.1 g/dL (ref 12.0–15.0)
Lymphocytes Relative: 22.2 % (ref 12.0–46.0)
Lymphs Abs: 1.7 10*3/uL (ref 0.7–4.0)
MCHC: 31.5 g/dL (ref 30.0–36.0)
MCV: 84.9 fl (ref 78.0–100.0)
Monocytes Absolute: 0.6 10*3/uL (ref 0.1–1.0)
Monocytes Relative: 8.3 % (ref 3.0–12.0)
Neutro Abs: 4.9 10*3/uL (ref 1.4–7.7)
Neutrophils Relative %: 65.5 % (ref 43.0–77.0)
Platelets: 384 10*3/uL (ref 150.0–400.0)
RBC: 4.51 Mil/uL (ref 3.87–5.11)
RDW: 14.7 % — ABNORMAL HIGH (ref 11.5–14.6)
WBC: 7.5 10*3/uL (ref 4.5–10.5)

## 2011-12-06 LAB — BRAIN NATRIURETIC PEPTIDE: Pro B Natriuretic peptide (BNP): 29 pg/mL (ref 0.0–100.0)

## 2011-12-06 LAB — TSH: TSH: 3.27 u[IU]/mL (ref 0.35–5.50)

## 2011-12-06 MED ORDER — METOPROLOL SUCCINATE ER 50 MG PO TB24: 50.0000 mg | ORAL_TABLET | Freq: Two times a day (BID) | ORAL | Status: DC

## 2011-12-06 NOTE — Telephone Encounter (Signed)
Pt notified of lab results.  Mariell Nester, CMA 

## 2011-12-06 NOTE — Progress Notes (Signed)
Alexandria Leach Date of Birth: April 14, 1950 Medical Record #[id number]  History of Present Illness: Alexandria Leach is seen today for a work in visit. She is seen for Dr. Jens Som. She has a history of MV repair secondary to rheumatic heart disease back in 1980 per Dr. Nedra Hai. Her other problems include HTN, GERD, fibromyalgia, anxiety, DVT (off coumadin since 2011), connective tissue disease, and HLD. She was last seen here in May of 2012 when she established her cardiology care with Dr. Jens Som. Echo was done back in July of 2013 with results as below. She has most recently had her gallbladder removed and a hernia repair per Dr. Derrell Lolling.  She comes in today. She is here with her husband. She is complaining of feeling her heart skipping. Not dizzy or lightheaded. No chest pain. She has had her gallbladder removed last week and a hernia repaired per Dr. Derrell Lolling. She initially did well but 2 to 3 days post op, got short of breath and had fever of 100. Went to the ER and was admitted. Was not on telemetry. Now been home for 2 days. Was told she had heart failure but her CXR and CT showed pneumonia. Pro BNP was only 439. Her HCTX was switched to Lasix. Since she has been home, she just feels tired. No swelling. No more cough. On antibiotics for a few more days. She admits that she had not been moving around and not taking deep breaths after her surgery. On narcotics. Was using lots of salt prior to her surgery as well. Does not check her blood pressure at home but has a cuff. She uses a walker due to an increased risk of falls.   Current Outpatient Prescriptions on File Prior to Visit  Medication Sig Dispense Refill  . amLODipine (NORVASC) 5 MG tablet Take 5 mg by mouth every morning.      Marland Kitchen aspirin 325 MG EC tablet Take 325 mg by mouth every morning.       . cholecalciferol (VITAMIN D) 1000 UNITS tablet Take 1,000 Units by mouth 2 (two) times daily.       . Cinnamon 500 MG TABS Take 500 mg by mouth daily.         . clonazePAM (KLONOPIN) 0.5 MG tablet Take 0.5 mg by mouth 2 (two) times daily as needed. For anxiety      . docusate sodium 100 MG CAPS Take 100 mg by mouth 2 (two) times daily.  60 capsule  0  . famotidine (PEPCID) 20 MG tablet Take 20 mg by mouth 2 (two) times daily.        . furosemide (LASIX) 20 MG tablet Take 1 tablet (20 mg total) by mouth daily.  30 tablet  3  . gabapentin (NEURONTIN) 300 MG capsule Take 300 mg by mouth 3 (three) times daily.       Marland Kitchen HYDROcodone-acetaminophen (NORCO/VICODIN) 5-325 MG per tablet Take 1-2 tablets by mouth every 4 (four) hours as needed for pain.  50 tablet  1  . l-methylfolate-B6-B12 (METANX) 3-35-2 MG TABS Take 1 tablet by mouth 2 (two) times daily.       Marland Kitchen levofloxacin (LEVAQUIN) 750 MG tablet Take 1 tablet (750 mg total) by mouth daily.  4 tablet  0  . omeprazole (PRILOSEC) 20 MG capsule Take 20 mg by mouth every morning.       . polyethylene glycol (MIRALAX / GLYCOLAX) packet Take 17 g by mouth 2 (two) times daily.  14 each  3  .  traMADol (ULTRAM) 50 MG tablet Take 50 mg by mouth every 6 (six) hours as needed. For pain. Maximum dose= 8 tablets per day for pain.      Marland Kitchen DISCONTD: metoprolol succinate (TOPROL-XL) 50 MG 24 hr tablet Take 50 mg by mouth every morning. Take with or immediately following a meal.        Allergies  Allergen Reactions  . Statins Other (See Comments)    Muscle cramps  . Sulfa Antibiotics Nausea And Vomiting    Past Medical History  Diagnosis Date  . Depression   . HTN (hypertension)   . Rheumatic heart disease     s/p mitral valve repair 1980  . Hypercholesteremia   . Insomnia   . Anxiety   . DVT (deep venous thrombosis)   . Pain, joint, multiple sites   . Fibromyalgia   . Connective tissue disease   . Arthritis   . Heart murmur   . Mitral stenosis   . Personal history of urinary calculi   . PONV (postoperative nausea and vomiting)   . Peripheral vascular disease     hx dvt  . Stones in the urinary tract    . Refusal of blood transfusions as patient is Jehovah's Witness     Past Surgical History  Procedure Date  . Mitral valve repair 1980  . Tubal ligation 1980  . Vaginal hysterectomy 1992    for fibroids -- partial  . Cholecystectomy 11/27/2011    laproscopic  . Hernia repair 11/27/2011    laproscopic  . Cholecystectomy 11/27/2011    Procedure: LAPAROSCOPIC CHOLECYSTECTOMY WITH INTRAOPERATIVE CHOLANGIOGRAM;  Surgeon: Ernestene Mention, MD;  Location: [facility] OR;  Service: General;  Laterality: N/A;  laparoscopic cholecystectomy with choleangiogram umbilical hernia repair  . Umbilical hernia repair 11/27/2011    Procedure: HERNIA REPAIR UMBILICAL ADULT;  Surgeon: Ernestene Mention, MD;  Location: [facility] OR;  Service: General;  Laterality: N/A;    History  Smoking status  . Never Smoker   Smokeless tobacco  . Never Used    History  Alcohol Use No    Family History  Problem Relation Age of Onset  . Lupus    . Kidney failure    . Bone cancer    . Heart disease Mother     MI in her 62s  . ALS Mother   . Kidney disease Father   . Cancer Brother     bone  . Cancer Maternal Uncle     bone    Review of Systems: The review of systems is per the HPI.  All other systems were reviewed and are negative.  Physical Exam: BP 150/70  Pulse 94  Resp 20  Ht 5\' 1"  (1.549 m)  Wt 175 lb (79.379 kg)  BMI 33.07 kg/m2 Patient is very pleasant and in no acute distress. Skin is warm and dry. Color is normal.  HEENT is unremarkable. Normocephalic/atraumatic. PERRL. Sclera are nonicteric. Neck is supple. No masses. No JVD. Lungs are clear. Cardiac exam shows a regular rate and rhythm. Rate is about 100 by me. Abdomen is soft. Extremities are without edema. Gait and ROM are intact. No gross neurologic deficits noted.   LABORATORY DATA: EKG today shows sinus rhythm. She has nonspecific ST and T wave changes.   Echo Study Conclusions from July 2013  - Left ventricle: The cavity size was normal. There  was mild focal basal hypertrophy of the septum. Systolic function was normal. The estimated ejection fraction was in the  range of 60% to 65%. Wall motion was normal; there were no regional wall motion abnormalities. Features are consistent with a pseudonormal left ventricular filling pattern, with concomitant abnormal relaxation and increased filling pressure (grade 2 diastolic dysfunction). Doppler parameters are consistent with high ventricular filling pressure. - Aortic valve: There was mild stenosis. Trivial regurgitation. - Mitral valve: Prior procedures included surgical repair. Mobility of the posterior leaflet was restricted. The findings are consistent with mild stenosis. Mild regurgitation directed posteriorly. - Left atrium: The atrium was moderately dilated. - Pulmonary arteries: PA peak pressure: 34mm Hg (S).  Lab Results  Component Value Date   WBC 8.3 12/02/2011   HGB 13.0 12/02/2011   HCT 38.0 12/02/2011   PLT 302 12/02/2011   GLUCOSE 106* 12/04/2011   CHOL 267* 03/23/2008   TRIG 124 03/23/2008   HDL 65 03/23/2008   LDLCALC 409* 03/23/2008   ALT 73* 12/03/2011   AST 77* 12/03/2011   NA 136 12/04/2011   K 4.0 12/04/2011   CL 99 12/04/2011   CREATININE 1.05 12/04/2011   BUN 11 12/04/2011   CO2 25 12/04/2011   TSH 1.877 11/26/2006   INR 1.00 11/25/2011   Dg Chest 2 View  12/03/2011  *RADIOLOGY REPORT*  Clinical Data: Pneumonia  CHEST - 2 VIEW  Comparison: 12/01/2011  Findings: Right basilar airspace disease has improved.  Right pleural effusion has improved.  Minimal linear atelectasis at the left base.  Upper normal heart size.  Postoperative changes. Pneumothorax.  IMPRESSION: Improved right lower lobe pneumonia and right pleural effusion. There is persistent disease.   Original Report Authenticated By: Donavan Burnet, M.D.    Dg Chest 2 View  12/01/2011  *RADIOLOGY REPORT*  Clinical Data: Shortness of breath and chest pain.  CHEST - 2 VIEW  Comparison: PA and lateral chest  11/25/2011.  Findings: The patient has a small right pleural effusion and basilar airspace disease.  Lung volumes are low.  No pneumothorax identified.  The patient is status post median sternotomy.  Heart size is upper normal.  IMPRESSION: Small right pleural effusion and basilar airspace disease worrisome for pneumonia.   Original Report Authenticated By: Bernadene Bell. Maricela Curet, M.D.    Ct Angio Chest Pe W/cm &/or Wo Cm  12/01/2011  *RADIOLOGY REPORT*  Clinical Data: Hernia and gallbladder surgery this week.  Shortness of breath.  CT ANGIOGRAPHY CHEST  Technique:  Multidetector CT imaging of the chest using the standard protocol during bolus administration of intravenous contrast. Multiplanar reconstructed images including MIPs were obtained and reviewed to evaluate the vascular anatomy.  Contrast: OMNIPAQUE IOHEXOL 350 MG/ML SOLN  Comparison: Chest x-ray earlier today.  Findings: Bibasilar atelectasis.  Suspected lower lobe pneumonia, worse on the right with air bronchograms and consolidation.  Good opacification of the pulmonary vasculature without evidence for filling defect to suggest acute PE.  Cardiomegaly.  No pericardial effusion.  Slight right greater than left pleural effusions.  No hilar or mediastinal adenopathy.  Prior median sternotomy for mitral valve replacement.  Unremarkable appearing aorta.  Hepatic steatosis.  Cholecystectomy clips with slight fluid in the gallbladder fossa is incompletely evaluated. No acute osseous abnormality.  IMPRESSION: Bibasilar atelectasis with suspected right greater than left bilateral lower lobe pneumonia.  No evidence for pulmonary embolic disease.   Original Report Authenticated By: Elsie Stain, M.D.    Dg Abd 2 Views  12/03/2011  *RADIOLOGY REPORT*  Clinical Data: Constipation  ABDOMEN - 2 VIEW  Comparison: 10/01/2011  Findings: Partially imaged sternotomy  wires.  See contemporaneous chest report regarding the lung base process sees.  Surgical clips  right upper quadrant.  No free intraperitoneal air. The bowel gas pattern is non-obstructive. Organ outlines are normal where seen. No acute or aggressive osseous abnormality identified.  IMPRESSION: Nonobstructive bowel gas pattern.   Original Report Authenticated By: Waneta Martins, M.D.     Assessment / Plan: 1. Palpitations - I suspect this is due to having laparoscopic surgery/gas - She is on metoprolol. We will increase her to BID. BP is up today.   2. Dyspnea - reportedly with heart failure. BNP not all that impressive. She also had fever. CXR and CT favored pneumonia. She does have diastolic dysfunction. I have asked her to cut back on her salt. May need to consider repeat CXR when seen back.   3. HTN - BP is up. Metoprolol is increased. Will ask her to monitor as well.   We will check follow up labs today. Metoprolol is increased. Will hold on placing holter at this time. She is to check some blood pressure's at home for me. I will see her back in about 10 days.   Patient is agreeable to this plan and will call if any problems develop in the interim.

## 2011-12-06 NOTE — Telephone Encounter (Signed)
Message copied by Awilda Bill on Fri Dec 06, 2011  3:15 PM ------      Message from: Rosalio Macadamia      Created: Fri Dec 06, 2011  1:38 PM       Ok to report. Labs look good. Liver tests are coming back to normal. Her fluid level test is totally normal.

## 2011-12-06 NOTE — Patient Instructions (Addendum)
We need to recheck some lab today  Increase your Metoprolol to two times a day. I have sent this RX to the drug store  Minimize your salt  Try to check some blood pressures for  We will check an EKG today  I want to see you in about 10 days. Will probably send you for a repeat CXR  Walk as much as possible; cough and deep breath!  Call the [facility] office at [phone] if you have any questions, problems or concerns.

## 2011-12-08 LAB — CULTURE, BLOOD (ROUTINE X 2): Culture: NO GROWTH

## 2011-12-12 ENCOUNTER — Encounter (INDEPENDENT_AMBULATORY_CARE_PROVIDER_SITE_OTHER): Payer: Self-pay | Admitting: General Surgery

## 2011-12-12 ENCOUNTER — Ambulatory Visit (INDEPENDENT_AMBULATORY_CARE_PROVIDER_SITE_OTHER): Payer: PRIVATE HEALTH INSURANCE | Admitting: General Surgery

## 2011-12-12 VITALS — BP 136/74 | HR 72 | Temp 97.6°F | Resp 16 | Ht 61.0 in | Wt 174.2 lb

## 2011-12-12 DIAGNOSIS — K8019 Calculus of gallbladder with other cholecystitis with obstruction: Secondary | ICD-10-CM

## 2011-12-12 NOTE — Progress Notes (Signed)
Patient ID: Alexandria Leach, female   DOB: [birth year], 61 y.o.   MRN: [id number] History: This patient underwent elective laparoscopic cholecystectomy with cholangiogram and primary repair of a ventral hernia at the umbilicus on 11/27/2011. We found the gallbladder was chronically inflamed with extensive adhesions. Cholangiogram was normal. Pathology report showed chronic cholecystitis with cholelithiasis.\ The patient had to be readmitted with the breathing problems. They were never clear about whether this was mild congestive heart failure or mild pneumonia but they treated but then she quickly responded. She now feels well. Tolerating diet. Bowel function is normal. No pain. No wound problems.  Exam: Patient looks well. No distress. Abdomen soft. Nontender. Trocar sites well-healed. Umbilical incision well healed. No recurrent hernia.  Assessment: Chronic cholecystitis with cholelithiasis Periumbilical ventral hernia Uneventful recovery following laparoscopic cholecystectomy the cholangiogram and repair of ventral hernia Rheumatic heart disease with history of mitral valve replacement Readmission for heart failure versus pneumonia, resolved Hypertension History DVT, now off Coumadin  Plan: Low-fat diet. Activities are restricted to no sports and no lifting into last October 11. After October 11 of activities are unrestricted. Baseline return to see me when necessary.  Angelia Mould. Derrell Lolling, M.D., [facility] General and Minimally invasive Surgery Breast and Colorectal Surgery Office:   [phone] Pager:   [phone]

## 2011-12-12 NOTE — Patient Instructions (Signed)
You have recovered from your gallbladder surgery and hernia repair without any obvious surgical complications.  Because of the hernia surgery, you are restricted to limited activities until after October 11.  You are restricted to  no sports, no lifting more than 20 pounds, no running or swimming. You may walk an unlimited amount. I encourage you to walk daily.  After October 11 there are no restrictions on your activities.  Follow a low fat diet and drink lots of  fluids.  Return to see Dr. Derrell Lolling in further problems arise.

## 2011-12-16 ENCOUNTER — Ambulatory Visit
Admission: RE | Admit: 2011-12-16 | Discharge: 2011-12-16 | Disposition: A | Discharge status: Home / Self Care | Payer: PRIVATE HEALTH INSURANCE | Source: Ambulatory Visit | Attending: Nurse Practitioner | Admitting: Nurse Practitioner

## 2011-12-16 ENCOUNTER — Telehealth: Payer: Self-pay | Admitting: *Deleted

## 2011-12-16 ENCOUNTER — Encounter: Payer: Self-pay | Admitting: Nurse Practitioner

## 2011-12-16 ENCOUNTER — Ambulatory Visit (INDEPENDENT_AMBULATORY_CARE_PROVIDER_SITE_OTHER): Payer: PRIVATE HEALTH INSURANCE | Admitting: Nurse Practitioner

## 2011-12-16 VITALS — BP 142/80 | HR 78 | Ht 64.0 in | Wt 176.4 lb

## 2011-12-16 DIAGNOSIS — I5030 Unspecified diastolic (congestive) heart failure: Secondary | ICD-10-CM

## 2011-12-16 DIAGNOSIS — F411 Generalized anxiety disorder: Secondary | ICD-10-CM

## 2011-12-16 DIAGNOSIS — J189 Pneumonia, unspecified organism: Secondary | ICD-10-CM

## 2011-12-16 DIAGNOSIS — I509 Heart failure, unspecified: Secondary | ICD-10-CM

## 2011-12-16 MED ORDER — AMOXICILLIN 500 MG PO CAPS: ORAL_CAPSULE | ORAL | Status: DC

## 2011-12-16 NOTE — Progress Notes (Signed)
Alexandria Leach Date of Birth: 10-11-1950 Medical Record #[id number]  History of Present Illness: Alexandria Leach is seen back today for a 10 days check. She is seen for Dr. Jens Som. She has a history of MV repair secondary to rheumatic heart disease back in 1980 per Dr. Nedra Hai. Her other problems include HTN, GERD, fibromyalgia, anxiety, DVT (off coumadin since 2011), connective tissue disease, and HLD. She has had a recent echo in July which was felt to basically be unchanged. Does have grade 2 diastolic dysfunction. EF is normal. She has recently had gallbladder removal and hernia repair. Had post op fever/pneumonia. Was told it was heart failure. HCTZ was switched to Lasix. I saw her about 10 days ago with palpitations. She was pretty weak - using a walker. She was not moving around. Did like salt. We increased her metoprolol at that visit. I thought that with some time, she would improve on her own.   She comes in today. She is here alone today. She looks better and feels much better. She says she "turned the corner" just a couple of days after her last visit here. Her palpitations have basically resolved. She is walking. No longer using the walker. Cutting back on her salt. Not short of breath. No swelling. No cough. No more fever or chills. LFT's have improved. She has had a good report from Dr. Derrell Lolling.   Current Outpatient Prescriptions on File Prior to Visit  Medication Sig Dispense Refill  . amLODipine (NORVASC) 5 MG tablet Take 5 mg by mouth every morning.      Marland Kitchen aspirin 325 MG EC tablet Take 325 mg by mouth every morning.       . cholecalciferol (VITAMIN D) 1000 UNITS tablet Take 1,000 Units by mouth 2 (two) times daily.       . Cinnamon 500 MG TABS Take 500 mg by mouth daily.       . clonazePAM (KLONOPIN) 0.5 MG tablet Take 0.5 mg by mouth 2 (two) times daily as needed. For anxiety      . docusate sodium 100 MG CAPS Take 100 mg by mouth 2 (two) times daily.  60 capsule  0  . famotidine (PEPCID)  20 MG tablet Take 20 mg by mouth 2 (two) times daily.        . furosemide (LASIX) 20 MG tablet Take 1 tablet (20 mg total) by mouth daily.  30 tablet  3  . gabapentin (NEURONTIN) 300 MG capsule Take 300 mg by mouth 3 (three) times daily.       Marland Kitchen l-methylfolate-B6-B12 (METANX) 3-35-2 MG TABS Take 1 tablet by mouth 2 (two) times daily.       . metoprolol succinate (TOPROL-XL) 50 MG 24 hr tablet Take 1 tablet (50 mg total) by mouth 2 (two) times daily. Take with or immediately following a meal.  60 tablet  6  . omeprazole (PRILOSEC) 20 MG capsule Take 20 mg by mouth every morning.       . polyethylene glycol (MIRALAX / GLYCOLAX) packet Take 17 g by mouth 2 (two) times daily.  14 each  3  . traMADol (ULTRAM) 50 MG tablet Take 50 mg by mouth every 6 (six) hours as needed. For pain. Maximum dose= 8 tablets per day for pain.        Allergies  Allergen Reactions  . Statins Other (See Comments)    Muscle cramps  . Sulfa Antibiotics Nausea And Vomiting    Past Medical History  Diagnosis Date  .  Depression   . HTN (hypertension)   . Rheumatic heart disease     s/p mitral valve repair 1980  . Hypercholesteremia   . Insomnia   . Anxiety   . DVT (deep venous thrombosis)   . Pain, joint, multiple sites   . Fibromyalgia   . Connective tissue disease   . Arthritis   . Heart murmur   . Mitral stenosis   . Personal history of urinary calculi   . PONV (postoperative nausea and vomiting)   . Peripheral vascular disease     hx dvt  . Stones in the urinary tract   . Refusal of blood transfusions as patient is Jehovah's Witness     Past Surgical History  Procedure Date  . Mitral valve repair 1980  . Tubal ligation 1980  . Vaginal hysterectomy 1992    for fibroids -- partial  . Cholecystectomy 11/27/2011    laproscopic  . Hernia repair 11/27/2011    laproscopic  . Cholecystectomy 11/27/2011    Procedure: LAPAROSCOPIC CHOLECYSTECTOMY WITH INTRAOPERATIVE CHOLANGIOGRAM;  Surgeon: Ernestene Mention, MD;  Location: [facility] OR;  Service: General;  Laterality: N/A;  laparoscopic cholecystectomy with choleangiogram umbilical hernia repair  . Umbilical hernia repair 11/27/2011    Procedure: HERNIA REPAIR UMBILICAL ADULT;  Surgeon: Ernestene Mention, MD;  Location: [facility] OR;  Service: General;  Laterality: N/A;    History  Smoking status  . Never Smoker   Smokeless tobacco  . Never Used    History  Alcohol Use No    Family History  Problem Relation Age of Onset  . Lupus    . Kidney failure    . Bone cancer    . Heart disease Mother     MI in her 77s  . ALS Mother   . Kidney disease Father   . Cancer Brother     bone  . Cancer Maternal Uncle     bone    Review of Systems: The review of systems is per the HPI.  All other systems were reviewed and are negative.  Physical Exam: BP 142/80  Pulse 78  Ht 5\' 4"  (1.626 m)  Wt 176 lb 6.4 oz (80.015 kg)  BMI 30.28 kg/m2 Patient is very pleasant and in no acute distress. She is subsequently seen with Dr. Jens Som today. She looks much stronger today. Skin is warm and dry. Color is normal.  HEENT is unremarkable. Normocephalic/atraumatic. PERRL. Sclera are nonicteric. Neck is supple. No masses. No JVD. Lungs are clear. Cardiac exam shows a regular rate and rhythm.Soft systolic murmur noted. Abdomen is soft. Extremities are without edema. Gait and ROM are intact. No gross neurologic deficits noted.   LABORATORY DATA:  Lab Results  Component Value Date   WBC 7.5 12/06/2011   HGB 12.1 12/06/2011   HCT 38.3 12/06/2011   PLT 384.0 12/06/2011   GLUCOSE 116* 12/06/2011   CHOL 267* 03/23/2008   TRIG 124 03/23/2008   HDL 65 03/23/2008   LDLCALC 161* 03/23/2008   ALT 51* 12/06/2011   AST 42* 12/06/2011   NA 136 12/06/2011   K 4.2 12/06/2011   CL 102 12/06/2011   CREATININE 1.1 12/06/2011   BUN 11 12/06/2011   CO2 25 12/06/2011   TSH 3.27 12/06/2011   INR 1.00 11/25/2011     Assessment / Plan: 1. Post op pneumonia - she does look much better  today. Will check follow up CXR.   2. Palpitations - improved with the increase in beta  blocker  3. Valvular heart disease - echo basically unchanged.   Overall, she is felt to be doing very well from our standpoint. Salt restriction is encouraged. She needs to stay active. She will see Dr. Jens Som back in one year. Patient is agreeable to this plan and will call if any problems develop in the interim.

## 2011-12-16 NOTE — Progress Notes (Signed)
Patient ID: Alexandria Leach, female   DOB: Jun 15, 1950, 61 y.o.   MRN: [id number]

## 2011-12-16 NOTE — Patient Instructions (Signed)
Let's get a follow up CXR today at [facility]  We will change your visit with Dr. Jens Som to one year.  Stay on your current medicines  Call the [facility] office at [phone] if you have any questions, problems or concerns.

## 2011-12-16 NOTE — Telephone Encounter (Signed)
Message copied by Awilda Bill on Mon Dec 16, 2011  1:43 PM ------      Message from: Rosalio Macadamia      Created: Mon Dec 16, 2011  1:18 PM       Ok to report. CXR shows no active process. Pneumonia resolved.

## 2011-12-16 NOTE — Telephone Encounter (Signed)
Informed pt of CXR result.  Vista Mink, CMA

## 2011-12-25 ENCOUNTER — Encounter: Payer: PRIVATE HEALTH INSURANCE | Admitting: Cardiology

## 2012-01-11 NOTE — ED Provider Notes (Signed)
Medical screening examination/treatment/procedure(s) were conducted as a shared visit with non-physician practitioner(s) and myself.  I personally evaluated the patient during the encounter  Pt with recent surgery hx comes in with some dyspnea. Evaluation in the ED revealed HCAP - and she is being admitted for that.  Derwood Kaplan, MD 01/11/12 [phone]

## 2012-03-18 DIAGNOSIS — H269 Unspecified cataract: Secondary | ICD-10-CM

## 2012-03-18 HISTORY — DX: Unspecified cataract: H26.9

## 2012-03-18 HISTORY — PX: EYE SURGERY: SHX253

## 2012-03-31 ENCOUNTER — Telehealth: Payer: Self-pay | Admitting: Cardiology

## 2012-03-31 NOTE — Telephone Encounter (Signed)
Echo results from 09-2011 faxed to the number provided.

## 2012-03-31 NOTE — Telephone Encounter (Signed)
New problem:   patient in the CHF program . Need the current  EJ fraction. Fax # [phone].

## 2012-04-23 ENCOUNTER — Encounter: Payer: Self-pay | Admitting: Cardiology

## 2012-04-23 ENCOUNTER — Ambulatory Visit (INDEPENDENT_AMBULATORY_CARE_PROVIDER_SITE_OTHER): Payer: PRIVATE HEALTH INSURANCE | Admitting: Cardiology

## 2012-04-23 VITALS — BP 140/82 | HR 84 | Wt 180.0 lb

## 2012-04-23 DIAGNOSIS — E7800 Pure hypercholesterolemia, unspecified: Secondary | ICD-10-CM

## 2012-04-23 DIAGNOSIS — I10 Essential (primary) hypertension: Secondary | ICD-10-CM

## 2012-04-23 DIAGNOSIS — I5030 Unspecified diastolic (congestive) heart failure: Secondary | ICD-10-CM

## 2012-04-23 DIAGNOSIS — I350 Nonrheumatic aortic (valve) stenosis: Secondary | ICD-10-CM

## 2012-04-23 DIAGNOSIS — I359 Nonrheumatic aortic valve disorder, unspecified: Secondary | ICD-10-CM

## 2012-04-23 DIAGNOSIS — I509 Heart failure, unspecified: Secondary | ICD-10-CM

## 2012-04-23 DIAGNOSIS — Z9889 Other specified postprocedural states: Secondary | ICD-10-CM

## 2012-04-23 NOTE — Assessment & Plan Note (Signed)
euvolemic on examination. Continue present dose of diuretic. 

## 2012-04-23 NOTE — Progress Notes (Signed)
HPI: Pleasant female with past medical history of mitral valve repair secondary to rheumatic heart disease for fu. Patient is status post mitral valve repair in 1980. Operative report not available. Last echocardiogram July 2013 showed normal LV function, grade 2 diastolic dysfunction, mild aortic stenosis/trace aortic insufficiency, previous mitral valve repair with mild mitral stenosis and regurgitation and moderate left atrial enlargement. I last saw her in May of 2012. Since then, the patient has dyspnea with more extreme activities but not with routine activities. It is relieved with rest. It is not associated with chest pain. There is no orthopnea, PND or pedal edema. There is no syncope or palpitations. There is no exertional chest pain.    Current Outpatient Prescriptions  Medication Sig Dispense Refill  . amLODipine (NORVASC) 5 MG tablet Take 5 mg by mouth every morning.      Marland Kitchen amoxicillin (AMOXIL) 500 MG capsule Take total of 2gm 30 minutes prior to dental procedures  12 capsule  6  . aspirin 325 MG EC tablet Take 325 mg by mouth every morning.       . cholecalciferol (VITAMIN D) 1000 UNITS tablet Take 1,000 Units by mouth 2 (two) times daily.       . Cinnamon 500 MG TABS Take 500 mg by mouth daily.       . clonazePAM (KLONOPIN) 0.5 MG tablet Take 0.5 mg by mouth 2 (two) times daily as needed. For anxiety      . docusate sodium 100 MG CAPS Take 100 mg by mouth 2 (two) times daily.  60 capsule  0  . famotidine (PEPCID) 20 MG tablet Take 20 mg by mouth 2 (two) times daily.        . furosemide (LASIX) 20 MG tablet Take 1 tablet (20 mg total) by mouth daily.  30 tablet  3  . gabapentin (NEURONTIN) 300 MG capsule Take 300 mg by mouth 3 (three) times daily.       Marland Kitchen l-methylfolate-B6-B12 (METANX) 3-35-2 MG TABS Take 1 tablet by mouth 2 (two) times daily.       . metoprolol succinate (TOPROL-XL) 50 MG 24 hr tablet Take 1 tablet (50 mg total) by mouth 2 (two) times daily. Take with or  immediately following a meal.  60 tablet  6  . omeprazole (PRILOSEC) 20 MG capsule Take 20 mg by mouth every morning.       . polyethylene glycol (MIRALAX / GLYCOLAX) packet Take 17 g by mouth 2 (two) times daily.  14 each  3  . traMADol (ULTRAM) 50 MG tablet Take 50 mg by mouth every 6 (six) hours as needed. For pain. Maximum dose= 8 tablets per day for pain.         Past Medical History  Diagnosis Date  . Depression   . HTN (hypertension)   . Rheumatic heart disease     s/p mitral valve repair 1980  . Hypercholesteremia   . Insomnia   . Anxiety   . DVT (deep venous thrombosis)   . Pain, joint, multiple sites   . Fibromyalgia   . Connective tissue disease   . Arthritis   . Heart murmur   . Mitral stenosis   . Personal history of urinary calculi   . PONV (postoperative nausea and vomiting)   . Peripheral vascular disease     hx dvt  . Stones in the urinary tract   . Refusal of blood transfusions as patient is Jehovah's Witness     Past  Surgical History  Procedure Date  . Mitral valve repair 1980  . Tubal ligation 1980  . Vaginal hysterectomy 1992    for fibroids -- partial  . Cholecystectomy 11/27/2011    laproscopic  . Hernia repair 11/27/2011    laproscopic  . Cholecystectomy 11/27/2011    Procedure: LAPAROSCOPIC CHOLECYSTECTOMY WITH INTRAOPERATIVE CHOLANGIOGRAM;  Surgeon: Ernestene Mention, MD;  Location: [facility] OR;  Service: General;  Laterality: N/A;  laparoscopic cholecystectomy with choleangiogram umbilical hernia repair  . Umbilical hernia repair 11/27/2011    Procedure: HERNIA REPAIR UMBILICAL ADULT;  Surgeon: Ernestene Mention, MD;  Location: [facility] OR;  Service: General;  Laterality: N/A;    History   Social History  . Marital Status: Married    Spouse Name: N/A    Number of Children: 4  . Years of Education: N/A   Occupational History  .      Disability   Social History Main Topics  . Smoking status: Never Smoker   . Smokeless tobacco: Never Used  . Alcohol  Use: No  . Drug Use: No  . Sexually Active: Not Currently    Birth Control/ Protection: Surgical   Other Topics Concern  . Not on file   Social History Narrative  . No narrative on file    ROS: no fevers or chills, productive cough, hemoptysis, dysphasia, odynophagia, melena, hematochezia, dysuria, hematuria, rash, seizure activity, orthopnea, PND, pedal edema, claudication. Remaining systems are negative.  Physical Exam: Well-developed well-nourished in no acute distress.  Skin is warm and dry.  HEENT is normal.  Neck is supple.  Chest is clear to auscultation with normal expansion.  Cardiovascular exam is regular rate and rhythm.  Abdominal exam nontender or distended. No masses palpated. Extremities show no edema. neuro grossly intact  ECG sinus rhythm at a rate of 84. Normal axis. Nonspecific ST changes.

## 2012-04-23 NOTE — Assessment & Plan Note (Signed)
Blood pressure controlled. Continue present medications. Potassium and renal function monitored by primary care. 

## 2012-04-23 NOTE — Assessment & Plan Note (Signed)
Mild on most recent echocardiogram. We'll plan followup studies in the future.

## 2012-04-23 NOTE — Assessment & Plan Note (Signed)
Continue SBE prophylaxis. 

## 2012-04-23 NOTE — Assessment & Plan Note (Signed)
Continue statin. Lipids and liver monitored by primary care. 

## 2012-04-23 NOTE — Patient Instructions (Addendum)
Your physician wants you to follow-up in: ONE YEAR WITH DR CRENSHAW You will receive a reminder letter in the mail two months in advance. If you don't receive a letter, please call our office to schedule the follow-up appointment.  

## 2012-04-24 ENCOUNTER — Telehealth: Payer: Self-pay | Admitting: Cardiology

## 2012-04-24 MED ORDER — FUROSEMIDE 20 MG PO TABS: 20.0000 mg | ORAL_TABLET | Freq: Every day | ORAL | Status: DC

## 2012-04-24 NOTE — Telephone Encounter (Signed)
New Problem    Pt was in the hospital and had a prescription for Lasix. Prescription has now run out. Pt is requesting new prescription from Dr. Jens Som for Lasix 20 mg.

## 2012-04-24 NOTE — Telephone Encounter (Signed)
Spoke with pt, aware refill sent to the pharm. 

## 2012-05-02 ENCOUNTER — Other Ambulatory Visit: Payer: Self-pay

## 2012-07-08 ENCOUNTER — Other Ambulatory Visit (HOSPITAL_COMMUNITY): Payer: Self-pay | Admitting: Internal Medicine

## 2012-07-08 DIAGNOSIS — Z1231 Encounter for screening mammogram for malignant neoplasm of breast: Secondary | ICD-10-CM

## 2012-07-20 ENCOUNTER — Ambulatory Visit (HOSPITAL_COMMUNITY)
Admission: RE | Admit: 2012-07-20 | Discharge: 2012-07-20 | Disposition: A | Discharge status: Home / Self Care | Payer: PRIVATE HEALTH INSURANCE | Source: Ambulatory Visit | Attending: Internal Medicine | Admitting: Internal Medicine

## 2012-07-20 DIAGNOSIS — Z1231 Encounter for screening mammogram for malignant neoplasm of breast: Secondary | ICD-10-CM | POA: Insufficient documentation

## 2012-07-23 ENCOUNTER — Other Ambulatory Visit: Payer: Self-pay

## 2012-07-23 MED ORDER — METOPROLOL SUCCINATE ER 50 MG PO TB24: 50.0000 mg | ORAL_TABLET | Freq: Two times a day (BID) | ORAL | Status: DC

## 2012-08-16 HISTORY — PX: OTHER SURGICAL HISTORY: SHX169

## 2012-08-19 LAB — HM DEXA SCAN: HM DEXA SCAN: NORMAL

## 2012-09-10 ENCOUNTER — Emergency Department (HOSPITAL_COMMUNITY)
Admission: EM | Admit: 2012-09-10 | Discharge: 2012-09-10 | Disposition: A | Discharge status: Home / Self Care | Payer: PRIVATE HEALTH INSURANCE | Attending: Emergency Medicine | Admitting: Emergency Medicine

## 2012-09-10 ENCOUNTER — Encounter (HOSPITAL_COMMUNITY): Payer: Self-pay | Admitting: Emergency Medicine

## 2012-09-10 ENCOUNTER — Emergency Department (HOSPITAL_COMMUNITY): Payer: PRIVATE HEALTH INSURANCE

## 2012-09-10 DIAGNOSIS — F3289 Other specified depressive episodes: Secondary | ICD-10-CM | POA: Insufficient documentation

## 2012-09-10 DIAGNOSIS — R110 Nausea: Secondary | ICD-10-CM | POA: Insufficient documentation

## 2012-09-10 DIAGNOSIS — F329 Major depressive disorder, single episode, unspecified: Secondary | ICD-10-CM | POA: Insufficient documentation

## 2012-09-10 DIAGNOSIS — Z8639 Personal history of other endocrine, nutritional and metabolic disease: Secondary | ICD-10-CM | POA: Insufficient documentation

## 2012-09-10 DIAGNOSIS — Z90711 Acquired absence of uterus with remaining cervical stump: Secondary | ICD-10-CM | POA: Insufficient documentation

## 2012-09-10 DIAGNOSIS — G4700 Insomnia, unspecified: Secondary | ICD-10-CM | POA: Insufficient documentation

## 2012-09-10 DIAGNOSIS — Z79899 Other long term (current) drug therapy: Secondary | ICD-10-CM | POA: Insufficient documentation

## 2012-09-10 DIAGNOSIS — R109 Unspecified abdominal pain: Secondary | ICD-10-CM

## 2012-09-10 DIAGNOSIS — R011 Cardiac murmur, unspecified: Secondary | ICD-10-CM | POA: Insufficient documentation

## 2012-09-10 DIAGNOSIS — I10 Essential (primary) hypertension: Secondary | ICD-10-CM | POA: Insufficient documentation

## 2012-09-10 DIAGNOSIS — M129 Arthropathy, unspecified: Secondary | ICD-10-CM | POA: Insufficient documentation

## 2012-09-10 DIAGNOSIS — F411 Generalized anxiety disorder: Secondary | ICD-10-CM | POA: Insufficient documentation

## 2012-09-10 DIAGNOSIS — Z7982 Long term (current) use of aspirin: Secondary | ICD-10-CM | POA: Insufficient documentation

## 2012-09-10 DIAGNOSIS — Z8679 Personal history of other diseases of the circulatory system: Secondary | ICD-10-CM | POA: Insufficient documentation

## 2012-09-10 DIAGNOSIS — Z86718 Personal history of other venous thrombosis and embolism: Secondary | ICD-10-CM | POA: Insufficient documentation

## 2012-09-10 DIAGNOSIS — Z87442 Personal history of urinary calculi: Secondary | ICD-10-CM | POA: Insufficient documentation

## 2012-09-10 DIAGNOSIS — IMO0001 Reserved for inherently not codable concepts without codable children: Secondary | ICD-10-CM | POA: Insufficient documentation

## 2012-09-10 DIAGNOSIS — Z862 Personal history of diseases of the blood and blood-forming organs and certain disorders involving the immune mechanism: Secondary | ICD-10-CM | POA: Insufficient documentation

## 2012-09-10 DIAGNOSIS — Z9089 Acquired absence of other organs: Secondary | ICD-10-CM | POA: Insufficient documentation

## 2012-09-10 LAB — URINALYSIS, ROUTINE W REFLEX MICROSCOPIC
Bilirubin Urine: NEGATIVE
Glucose, UA: NEGATIVE mg/dL
Hgb urine dipstick: NEGATIVE
Specific Gravity, Urine: 1.015 (ref 1.005–1.030)
pH: 7 (ref 5.0–8.0)

## 2012-09-10 LAB — BASIC METABOLIC PANEL
BUN: 8 mg/dL (ref 6–23)
Chloride: 105 mEq/L (ref 96–112)
Glucose, Bld: 85 mg/dL (ref 70–99)
Potassium: 3.5 mEq/L (ref 3.5–5.1)

## 2012-09-10 LAB — URINE MICROSCOPIC-ADD ON

## 2012-09-10 MED ORDER — OXYCODONE-ACETAMINOPHEN 5-325 MG PO TABS: 1.0000 | ORAL_TABLET | ORAL | Status: DC | PRN

## 2012-09-10 MED ORDER — CYCLOBENZAPRINE HCL 10 MG PO TABS: 10.0000 mg | ORAL_TABLET | Freq: Two times a day (BID) | ORAL | Status: DC | PRN

## 2012-09-10 MED ORDER — MORPHINE SULFATE 4 MG/ML IJ SOLN
6.0000 mg | Freq: Once | INTRAMUSCULAR | Status: AC
  Administered 2012-09-10: 6 mg via INTRAVENOUS
  Filled 2012-09-10: qty 2

## 2012-09-10 MED ORDER — ONDANSETRON HCL 4 MG/2ML IJ SOLN
4.0000 mg | Freq: Once | INTRAMUSCULAR | Status: AC
  Administered 2012-09-10: 4 mg via INTRAVENOUS
  Filled 2012-09-10: qty 2

## 2012-09-10 NOTE — ED Notes (Signed)
Pt presenting to ed with c/o left side flank pain onset last night pt states she has history of kidney stones and that's what this pain feels like. Pt denies hematuria at this time pt states some nausea no vomiting

## 2012-09-10 NOTE — ED Notes (Signed)
Pt has left sided pain 5/10  Sharp shooting pain,  History of stones in September last year,  Some nausea,  Pt is alert and oriented in NAD

## 2012-09-10 NOTE — ED Provider Notes (Signed)
History    CSN: [id number] Arrival date & time 09/10/12  1905  First MD Initiated Contact with Patient 09/10/12 1919     Chief Complaint  Patient presents with  . Flank Pain    The history is provided by the patient.   patient reports left-sided flank pain that began last night and has continued through the day.  She feels like her pain is worsening.  She states the pain feels like prior kidney stones.  She denies hematuria, urinary frequency, dysuria.  She's had some nausea without vomiting.  She denies weakness of her lower extremities.  No fevers or chills.  Her pain is mild to moderate in severity.  Her pain is worsened by movement of her legs as well as laying back.   Past Medical History  Diagnosis Date  . Depression   . HTN (hypertension)   . Rheumatic heart disease     s/p mitral valve repair 1980  . Hypercholesteremia   . Insomnia   . Anxiety   . DVT (deep venous thrombosis)   . Pain, joint, multiple sites   . Fibromyalgia   . Connective tissue disease   . Arthritis   . Heart murmur   . Mitral stenosis   . Personal history of urinary calculi   . PONV (postoperative nausea and vomiting)   . Peripheral vascular disease     hx dvt  . Stones in the urinary tract   . Refusal of blood transfusions as patient is Jehovah's Witness    Past Surgical History  Procedure Laterality Date  . Mitral valve repair  1980  . Tubal ligation  1980  . Vaginal hysterectomy  1992    for fibroids -- partial  . Cholecystectomy  11/27/2011    laproscopic  . Hernia repair  11/27/2011    laproscopic  . Cholecystectomy  11/27/2011    Procedure: LAPAROSCOPIC CHOLECYSTECTOMY WITH INTRAOPERATIVE CHOLANGIOGRAM;  Surgeon: Ernestene Mention, MD;  Location: [facility] OR;  Service: General;  Laterality: N/A;  laparoscopic cholecystectomy with choleangiogram umbilical hernia repair  . Umbilical hernia repair  11/27/2011    Procedure: HERNIA REPAIR UMBILICAL ADULT;  Surgeon: Ernestene Mention, MD;  Location:  [facility] OR;  Service: General;  Laterality: N/A;   Family History  Problem Relation Age of Onset  . Lupus    . Kidney failure    . Bone cancer    . Heart disease Mother     MI in her 87s  . ALS Mother   . Kidney disease Father   . Cancer Brother     bone  . Cancer Maternal Uncle     bone   History  Substance Use Topics  . Smoking status: Never Smoker   . Smokeless tobacco: Never Used  . Alcohol Use: No   OB History   Grav Para Term Preterm Abortions TAB SAB Ect Mult Living                 Review of Systems  Genitourinary: Positive for flank pain.  All other systems reviewed and are negative.    Allergies  Statins and Sulfa antibiotics  Home Medications   Current Outpatient Rx  Name  Route  Sig  Dispense  Refill  . amLODipine (NORVASC) 5 MG tablet   Oral   Take 5 mg by mouth every morning.         Marland Kitchen antiseptic oral rinse (BIOTENE) LIQD   Mouth Rinse   15 mLs by Mouth Rinse  route as needed (Oral hygiene).          Marland Kitchen aspirin 325 MG EC tablet   Oral   Take 325 mg by mouth every morning.          . cholecalciferol (VITAMIN D) 1000 UNITS tablet   Oral   Take 1,000 Units by mouth every morning.          . Cinnamon 500 MG TABS   Oral   Take 1,000 mg by mouth every morning.          . clonazePAM (KLONOPIN) 1 MG tablet   Oral   Take 1 mg by mouth at bedtime.         . docusate sodium (COLACE) 50 MG capsule   Oral   Take by mouth every evening.         . Eyelid Cleansers (HM EYELID WIPES EX)   Apply externally   Apply 1 application topically every morning.         . famotidine (PEPCID) 20 MG tablet   Oral   Take 20 mg by mouth 2 (two) times daily.           . furosemide (LASIX) 20 MG tablet   Oral   Take 1 tablet (20 mg total) by mouth daily.   30 tablet   12   . gabapentin (NEURONTIN) 300 MG capsule   Oral   Take 300 mg by mouth 3 (three) times daily.          . hydroxychloroquine (PLAQUENIL) 200 MG tablet   Oral   Take  200-400 mg by mouth daily. Alternate days taking 1 pill, then 2 pills, then 1....         . metoprolol succinate (TOPROL-XL) 50 MG 24 hr tablet   Oral   Take 50 mg by mouth 2 (two) times daily. Take with or immediately following a meal.         . omeprazole (PRILOSEC) 20 MG capsule   Oral   Take 20 mg by mouth every morning.          . polyvinyl alcohol (ARTIFICIAL TEARS) 1.4 % ophthalmic solution   Both Eyes   Place 1 drop into both eyes 3 (three) times daily.         Marland Kitchen tiZANidine (ZANAFLEX) 4 MG tablet   Oral   Take 4 mg by mouth every 8 (eight) hours as needed (muscle spasms).         . traMADol (ULTRAM) 50 MG tablet   Oral   Take 100 mg by mouth 2 (two) times daily.          . cyclobenzaprine (FLEXERIL) 10 MG tablet   Oral   Take 1 tablet (10 mg total) by mouth 2 (two) times daily as needed for muscle spasms.   20 tablet   0   . oxyCODONE-acetaminophen (PERCOCET/ROXICET) 5-325 MG per tablet   Oral   Take 1 tablet by mouth every 4 (four) hours as needed for pain.   20 tablet   0    BP 132/67  Pulse 81  Temp(Src) 98.5 F (36.9 C) (Oral)  Resp 20  SpO2 100% Physical Exam  Nursing note and vitals reviewed. Constitutional: She is oriented to person, place, and time. She appears well-developed and well-nourished. No distress.  HENT:  Head: Normocephalic and atraumatic.  Eyes: EOM are normal.  Neck: Normal range of motion.  Cardiovascular: Normal rate, regular rhythm and normal heart sounds.  Pulmonary/Chest: Effort normal and breath sounds normal.  Abdominal: Soft. She exhibits no distension. There is no tenderness.  Musculoskeletal: Normal range of motion.  Mild tenderness over left lower back.  No rash.  Neurological: She is alert and oriented to person, place, and time.  Normal motor strength in her lower extremities  Skin: Skin is warm and dry.  Psychiatric: She has a normal mood and affect. Judgment normal.    ED Course  Procedures  (including critical care time) Labs Reviewed  URINALYSIS, ROUTINE W REFLEX MICROSCOPIC - Abnormal; Notable for the following:    APPearance CLOUDY (*)    Leukocytes, UA LARGE (*)    All other components within normal limits  BASIC METABOLIC PANEL - Abnormal; Notable for the following:    GFR calc non Af Amer 59 (*)    GFR calc Af Amer 69 (*)    All other components within normal limits  URINE MICROSCOPIC-ADD ON - Abnormal; Notable for the following:    Squamous Epithelial / LPF FEW (*)    All other components within normal limits   Ct Abdomen Pelvis Wo Contrast  09/10/2012   *RADIOLOGY REPORT*  Clinical Data: Left flank pain  CT ABDOMEN AND PELVIS WITHOUT CONTRAST  Technique:  Multidetector CT imaging of the abdomen and pelvis was performed following the standard protocol without intravenous contrast.  Comparison: CT 08/01/2011  Findings: 1 mm nonobstructing right renal calculi.  No calculi on the left.  No ureteral or bladder calculi.  The lung bases are clear.  Liver spleen are normal.  Pancreas is normal.  Gallbladder is surgically absent.  Negative for bowel obstruction.  Appendix is normal.  Umbilical hernia appears to have been repaired in the interval with a small residual hernia present.  No mass or adenopathy.  1 cm poorly defined nodule on image #1 in the right lower lobe. This is not fully evaluated on this study.  Consider chest CT for further evaluation.  IMPRESSION: 1 mm nonobstructing right renal calculus.  No renal obstruction.  1 cm right lower lobe density incompletely evaluated.  Consider chest CT.   Original Report Authenticated By: Janeece Riggers, M.D.   1. Flank pain     MDM  Patients symptoms seem most consistent with musculoskeletal low back pain.  CT scan performed given her history of prior kidney stones.  There is no evidence of left-sided ureteral stones.  Patient is overall well-appearing.  She does have a few white blood cells in her urine however my suspicion for  urinary tract infection left-sided pyelonephritis is low.  Urine culture sent.  She understands to return to the ER for new or worsening symptoms  Lyanne Co, MD 09/10/12 [phone]

## 2012-09-22 ENCOUNTER — Encounter: Payer: Self-pay | Admitting: Cardiology

## 2012-09-22 ENCOUNTER — Ambulatory Visit (INDEPENDENT_AMBULATORY_CARE_PROVIDER_SITE_OTHER): Payer: PRIVATE HEALTH INSURANCE | Admitting: Cardiology

## 2012-09-22 VITALS — BP 120/82 | HR 73 | Wt 182.0 lb

## 2012-09-22 DIAGNOSIS — Z9889 Other specified postprocedural states: Secondary | ICD-10-CM

## 2012-09-22 DIAGNOSIS — R911 Solitary pulmonary nodule: Secondary | ICD-10-CM | POA: Insufficient documentation

## 2012-09-22 DIAGNOSIS — I059 Rheumatic mitral valve disease, unspecified: Secondary | ICD-10-CM

## 2012-09-22 DIAGNOSIS — I10 Essential (primary) hypertension: Secondary | ICD-10-CM

## 2012-09-22 HISTORY — DX: Solitary pulmonary nodule: R91.1

## 2012-09-22 NOTE — Assessment & Plan Note (Signed)
Continue present dose of Lasix. Take an additional 20 mg daily as needed for weight gain of 2-3 pounds or increased pedal edema.

## 2012-09-22 NOTE — Progress Notes (Signed)
HPI: Pleasant female with past medical history of mitral valve repair secondary to rheumatic heart disease for fu. Patient is status post mitral valve repair in 1980. Operative report not available. Last echocardiogram July 2013 showed normal LV function, grade 2 diastolic dysfunction, mild aortic stenosis/trace aortic insufficiency, previous mitral valve repair with mild mitral stenosis and regurgitation and moderate left atrial enlargement. Abd CT in June of 2014 showed incompletely imaged lung nodule. I last saw her in Feb 2014. Since then, she denies dyspnea, chest pain or syncope. Occasional mild pedal edema.  Current Outpatient Prescriptions  Medication Sig Dispense Refill  . amLODipine (NORVASC) 10 MG tablet Take 10 mg by mouth daily.      Marland Kitchen antiseptic oral rinse (BIOTENE) LIQD 15 mLs by Mouth Rinse route as needed (Oral hygiene).       Marland Kitchen aspirin 325 MG EC tablet Take 325 mg by mouth every morning.       . cholecalciferol (VITAMIN D) 1000 UNITS tablet Take 1,000 Units by mouth every morning.       . Cinnamon 500 MG TABS Take 1,000 mg by mouth every morning.       . clonazePAM (KLONOPIN) 1 MG tablet Take 1 mg by mouth at bedtime.      . cyclobenzaprine (FLEXERIL) 10 MG tablet Take 1 tablet (10 mg total) by mouth 2 (two) times daily as needed for muscle spasms.  20 tablet  0  . docusate sodium (COLACE) 50 MG capsule Take by mouth every evening.      . Eyelid Cleansers (HM EYELID WIPES EX) Apply 1 application topically every morning.      . famotidine (PEPCID) 20 MG tablet Take 20 mg by mouth 2 (two) times daily.        . furosemide (LASIX) 20 MG tablet Take 1 tablet (20 mg total) by mouth daily.  30 tablet  12  . gabapentin (NEURONTIN) 300 MG capsule Take 300 mg by mouth 3 (three) times daily.       . hydroxychloroquine (PLAQUENIL) 200 MG tablet Take 200-400 mg by mouth daily. Alternate days taking 1 pill, then 2 pills, then 1....      . metoprolol succinate (TOPROL-XL) 50 MG 24 hr tablet  Take 50 mg by mouth 2 (two) times daily. Take with or immediately following a meal.      . omeprazole (PRILOSEC) 20 MG capsule Take 20 mg by mouth every morning.       Marland Kitchen oxyCODONE-acetaminophen (PERCOCET/ROXICET) 5-325 MG per tablet Take 1 tablet by mouth every 4 (four) hours as needed for pain.  20 tablet  0  . polyvinyl alcohol (ARTIFICIAL TEARS) 1.4 % ophthalmic solution Place 1 drop into both eyes 3 (three) times daily.      Marland Kitchen tiZANidine (ZANAFLEX) 4 MG tablet Take 4 mg by mouth every 8 (eight) hours as needed (muscle spasms).      . traMADol (ULTRAM) 50 MG tablet Take 100 mg by mouth 2 (two) times daily.        No current facility-administered medications for this visit.     Past Medical History  Diagnosis Date  . Depression   . HTN (hypertension)   . Rheumatic heart disease     s/p mitral valve repair 1980  . Hypercholesteremia   . Insomnia   . Anxiety   . DVT (deep venous thrombosis)   . Pain, joint, multiple sites   . Fibromyalgia   . Connective tissue disease   . Arthritis   .  Heart murmur   . Mitral stenosis   . Personal history of urinary calculi   . PONV (postoperative nausea and vomiting)   . Peripheral vascular disease     hx dvt  . Stones in the urinary tract   . Refusal of blood transfusions as patient is Jehovah's Witness     Past Surgical History  Procedure Laterality Date  . Mitral valve repair  1980  . Tubal ligation  1980  . Vaginal hysterectomy  1992    for fibroids -- partial  . Cholecystectomy  11/27/2011    laproscopic  . Hernia repair  11/27/2011    laproscopic  . Cholecystectomy  11/27/2011    Procedure: LAPAROSCOPIC CHOLECYSTECTOMY WITH INTRAOPERATIVE CHOLANGIOGRAM;  Surgeon: Ernestene Mention, MD;  Location: [facility] OR;  Service: General;  Laterality: N/A;  laparoscopic cholecystectomy with choleangiogram umbilical hernia repair  . Umbilical hernia repair  11/27/2011    Procedure: HERNIA REPAIR UMBILICAL ADULT;  Surgeon: Ernestene Mention, MD;   Location: [facility] OR;  Service: General;  Laterality: N/A;    History   Social History  . Marital Status: Married    Spouse Name: N/A    Number of Children: 4  . Years of Education: N/A   Occupational History  .      Disability   Social History Main Topics  . Smoking status: Never Smoker   . Smokeless tobacco: Never Used  . Alcohol Use: No  . Drug Use: No  . Sexually Active: Not Currently    Birth Control/ Protection: Surgical   Other Topics Concern  . Not on file   Social History Narrative  . No narrative on file    ROS: no fevers or chills, productive cough, hemoptysis, dysphasia, odynophagia, melena, hematochezia, dysuria, hematuria, rash, seizure activity, orthopnea, PND, pedal edema, claudication. Remaining systems are negative.  Physical Exam: Well-developed well-nourished in no acute distress.  Skin is warm and dry.  HEENT is normal.  Neck is supple.  Chest is clear to auscultation with normal expansion.  Cardiovascular exam is regular rate and rhythm. 2/6 systolic murmur left sternal border Abdominal exam nontender or distended. No masses palpated. Extremities show no edema. neuro grossly intact  ECG sinus rhythm at a rate of 73. Nonspecific T-wave changes.

## 2012-09-22 NOTE — Patient Instructions (Addendum)
Your physician wants you to follow-up in: ONE YEAR WITH DR Shelda Pal will receive a reminder letter in the mail two months in advance. If you don't receive a letter, please call our office to schedule the follow-up appointment.   NON-CONTRAST CT SCAN OF CHEST TO FOLLOW LUNG NODULE  Your physician has requested that you have an echocardiogram. Echocardiography is a painless test that uses sound waves to create images of your heart. It provides your doctor with information about the size and shape of your heart and how well your heart's chambers and valves are working. This procedure takes approximately one hour. There are no restrictions for this procedure.   TAKE AN ADDITIONAL 20 MG OF FUROSEMIDE AS NEEDED FOR WEIGHT GAIN OF 2 LBS IN ONE DAY OR INCREASED SWELLING OR SHORTNESS OF BREATH

## 2012-09-22 NOTE — Assessment & Plan Note (Signed)
Poorly visualized lung nodule on recent abdominal CT. Schedule noncontrast chest CT to further evaluate.

## 2012-09-22 NOTE — Assessment & Plan Note (Signed)
Plan follow-up echocardiogram. 

## 2012-09-22 NOTE — Assessment & Plan Note (Signed)
History a mitral valve repair. Continue SBE prophylaxis. Repeat echocardiogram.

## 2012-09-22 NOTE — Assessment & Plan Note (Signed)
Continue present blood pressure medications. 

## 2012-09-23 ENCOUNTER — Ambulatory Visit (INDEPENDENT_AMBULATORY_CARE_PROVIDER_SITE_OTHER)
Admission: RE | Admit: 2012-09-23 | Discharge: 2012-09-23 | Disposition: A | Discharge status: Home / Self Care | Payer: PRIVATE HEALTH INSURANCE | Source: Ambulatory Visit | Attending: Cardiology | Admitting: Cardiology

## 2012-09-23 ENCOUNTER — Ambulatory Visit (HOSPITAL_COMMUNITY): Payer: PRIVATE HEALTH INSURANCE | Attending: Cardiology | Admitting: Radiology

## 2012-09-23 DIAGNOSIS — R911 Solitary pulmonary nodule: Secondary | ICD-10-CM

## 2012-09-23 DIAGNOSIS — E785 Hyperlipidemia, unspecified: Secondary | ICD-10-CM | POA: Insufficient documentation

## 2012-09-23 DIAGNOSIS — I10 Essential (primary) hypertension: Secondary | ICD-10-CM | POA: Insufficient documentation

## 2012-09-23 DIAGNOSIS — I059 Rheumatic mitral valve disease, unspecified: Secondary | ICD-10-CM | POA: Insufficient documentation

## 2012-09-23 DIAGNOSIS — I079 Rheumatic tricuspid valve disease, unspecified: Secondary | ICD-10-CM | POA: Insufficient documentation

## 2012-09-23 NOTE — Progress Notes (Signed)
Echocardiogram performed.  

## 2012-10-05 ENCOUNTER — Emergency Department (INDEPENDENT_AMBULATORY_CARE_PROVIDER_SITE_OTHER)
Admission: EM | Admit: 2012-10-05 | Discharge: 2012-10-05 | Disposition: A | Discharge status: Home / Self Care | Payer: PRIVATE HEALTH INSURANCE | Source: Home / Self Care

## 2012-10-05 ENCOUNTER — Emergency Department (INDEPENDENT_AMBULATORY_CARE_PROVIDER_SITE_OTHER): Payer: PRIVATE HEALTH INSURANCE

## 2012-10-05 ENCOUNTER — Encounter (HOSPITAL_COMMUNITY): Payer: Self-pay | Admitting: Emergency Medicine

## 2012-10-05 DIAGNOSIS — M25531 Pain in right wrist: Secondary | ICD-10-CM

## 2012-10-05 DIAGNOSIS — M25539 Pain in unspecified wrist: Secondary | ICD-10-CM

## 2012-10-05 MED ORDER — NAPROXEN 500 MG PO TABS: 500.0000 mg | ORAL_TABLET | Freq: Two times a day (BID) | ORAL | Status: DC

## 2012-10-05 NOTE — ED Notes (Signed)
Complaining of right hand pain.  Patient states on Thursday she fell and tried to use her right hand to catch herself.  Patient states that it is hard to move her thumb.  Only tried tylenol for pain and used heat pad but no relief.

## 2012-10-05 NOTE — ED Provider Notes (Signed)
History    CSN: [id number] Arrival date & time 10/05/12  1133  None    Chief Complaint  Patient presents with  . Hand Injury   (Consider location/radiation/quality/duration/timing/severity/associated sxs/prior Treatment) HPI Comments: Pt presents c/o right hand pain after [facility] Thursday, 4 days ago.  She has pain in the base of the thumb going up the arm that is exacerbated by any extension of the thumb.  She has been taking tylenol and using a heating pad for pain which have not been helping.  Denies any pain or injury elsewhere.  Denies Hx of wrist injury prior to this.    Patient is a 62 y.o. female presenting with hand injury.  Hand Injury Associated symptoms: no fever    Past Medical History  Diagnosis Date  . Depression   . HTN (hypertension)   . Rheumatic heart disease     s/p mitral valve repair 1980  . Hypercholesteremia   . Insomnia   . Anxiety   . DVT (deep venous thrombosis)   . Pain, joint, multiple sites   . Fibromyalgia   . Connective tissue disease   . Arthritis   . Heart murmur   . Mitral stenosis   . Personal history of urinary calculi   . PONV (postoperative nausea and vomiting)   . Peripheral vascular disease     hx dvt  . Stones in the urinary tract   . Refusal of blood transfusions as patient is Jehovah's Witness    Past Surgical History  Procedure Laterality Date  . Mitral valve repair  1980  . Tubal ligation  1980  . Vaginal hysterectomy  1992    for fibroids -- partial  . Cholecystectomy  11/27/2011    laproscopic  . Hernia repair  11/27/2011    laproscopic  . Cholecystectomy  11/27/2011    Procedure: LAPAROSCOPIC CHOLECYSTECTOMY WITH INTRAOPERATIVE CHOLANGIOGRAM;  Surgeon: Ernestene Mention, MD;  Location: [facility] OR;  Service: General;  Laterality: N/A;  laparoscopic cholecystectomy with choleangiogram umbilical hernia repair  . Umbilical hernia repair  11/27/2011    Procedure: HERNIA REPAIR UMBILICAL ADULT;  Surgeon: Ernestene Mention, MD;   Location: [facility] OR;  Service: General;  Laterality: N/A;   Family History  Problem Relation Age of Onset  . Lupus    . Kidney failure    . Bone cancer    . Heart disease Mother     MI in her 66s  . ALS Mother   . Kidney disease Father   . Cancer Brother     bone  . Cancer Maternal Uncle     bone   History  Substance Use Topics  . Smoking status: Never Smoker   . Smokeless tobacco: Never Used  . Alcohol Use: No   OB History   Grav Para Term Preterm Abortions TAB SAB Ect Mult Living                 Review of Systems  Constitutional: Negative for fever and chills.  Eyes: Negative for visual disturbance.  Respiratory: Negative for cough and shortness of breath.   Cardiovascular: Negative for chest pain, palpitations and leg swelling.  Gastrointestinal: Negative for nausea, vomiting and abdominal pain.  Endocrine: Negative for polydipsia and polyuria.  Genitourinary: Negative for dysuria, urgency and frequency.  Musculoskeletal: Positive for arthralgias (see HPI). Negative for myalgias.  Skin: Negative for rash.  Neurological: Negative for dizziness, weakness and light-headedness.    Allergies  Statins and Sulfa antibiotics  Home  Medications   Current Outpatient Rx  Name  Route  Sig  Dispense  Refill  . amLODipine (NORVASC) 10 MG tablet   Oral   Take 10 mg by mouth daily.         Marland Kitchen antiseptic oral rinse (BIOTENE) LIQD   Mouth Rinse   15 mLs by Mouth Rinse route as needed (Oral hygiene).          Marland Kitchen aspirin 325 MG EC tablet   Oral   Take 325 mg by mouth every morning.          . cholecalciferol (VITAMIN D) 1000 UNITS tablet   Oral   Take 1,000 Units by mouth every morning.          . Cinnamon 500 MG TABS   Oral   Take 1,000 mg by mouth every morning.          . clonazePAM (KLONOPIN) 1 MG tablet   Oral   Take 1 mg by mouth at bedtime.         . cyclobenzaprine (FLEXERIL) 10 MG tablet   Oral   Take 1 tablet (10 mg total) by mouth 2 (two) times  daily as needed for muscle spasms.   20 tablet   0   . docusate sodium (COLACE) 50 MG capsule   Oral   Take by mouth every evening.         . Eyelid Cleansers (HM EYELID WIPES EX)   Apply externally   Apply 1 application topically every morning.         . famotidine (PEPCID) 20 MG tablet   Oral   Take 20 mg by mouth 2 (two) times daily.           . furosemide (LASIX) 20 MG tablet   Oral   Take 1 tablet (20 mg total) by mouth daily.   30 tablet   12   . gabapentin (NEURONTIN) 300 MG capsule   Oral   Take 300 mg by mouth 3 (three) times daily.          . hydroxychloroquine (PLAQUENIL) 200 MG tablet   Oral   Take 200-400 mg by mouth daily. Alternate days taking 1 pill, then 2 pills, then 1....         . metoprolol succinate (TOPROL-XL) 50 MG 24 hr tablet   Oral   Take 50 mg by mouth 2 (two) times daily. Take with or immediately following a meal.         . naproxen (NAPROSYN) 500 MG tablet   Oral   Take 1 tablet (500 mg total) by mouth 2 (two) times daily.   60 tablet   0   . omeprazole (PRILOSEC) 20 MG capsule   Oral   Take 20 mg by mouth every morning.          Marland Kitchen oxyCODONE-acetaminophen (PERCOCET/ROXICET) 5-325 MG per tablet   Oral   Take 1 tablet by mouth every 4 (four) hours as needed for pain.   20 tablet   0   . polyvinyl alcohol (ARTIFICIAL TEARS) 1.4 % ophthalmic solution   Both Eyes   Place 1 drop into both eyes 3 (three) times daily.         Marland Kitchen tiZANidine (ZANAFLEX) 4 MG tablet   Oral   Take 4 mg by mouth every 8 (eight) hours as needed (muscle spasms).         . traMADol (ULTRAM) 50 MG tablet   Oral  Take 100 mg by mouth 2 (two) times daily.           BP 178/114  Pulse 73  Temp(Src) 98.6 F (37 C) (Oral)  Resp 18  SpO2 100% Physical Exam  Constitutional: She is oriented to person, place, and time. She appears well-developed and well-nourished. No distress.  HENT:  Head: Normocephalic and atraumatic.  Neck: Normal  range of motion. Neck supple.  Musculoskeletal: Normal range of motion.       Right wrist: She exhibits tenderness, bony tenderness and swelling. She exhibits normal range of motion, no effusion, no crepitus, no deformity and no laceration.  Tenderness just anterior to anatomic snuffbox and just distal to this.  Pain with AROM, no pain with PROM.   Neurological: She is alert and oriented to person, place, and time. She displays normal reflexes. No cranial nerve deficit. Coordination normal.  Skin: Skin is warm and dry. No rash noted. She is not diaphoretic.  Psychiatric: She has a normal mood and affect. Judgment normal.    ED Course  Procedures (including critical care time) Labs Reviewed - No data to display Dg Wrist Complete Right  10/05/2012   *RADIOLOGY REPORT*  Clinical Data: Injury right wrist post fall 4 days ago, radial pain  RIGHT WRIST - COMPLETE 3+ VIEW  Comparison: None.  Findings: Osseous mineralization normal. Borderline widening of the scapholunate interval cannot exclude torn scapholunate ligament. Remaining joint spaces preserved. No acute fracture, dislocation, or bone destruction.  IMPRESSION: Borderline widening of the scapholunate interval, cannot exclude torn scapholunate ligament. No fracture or dislocation seen.   Original Report Authenticated By: Ulyses Southward, M.D.   1. Wrist pain, right     MDM  XR indicated possible torn scapholunate ligament.  Placing in thumb spica and referring to ortho   Meds ordered this encounter  Medications  . naproxen (NAPROSYN) 500 MG tablet    Sig: Take 1 tablet (500 mg total) by mouth 2 (two) times daily.    Dispense:  60 tablet    Refill:  0     Graylon Good, PA-C 10/05/12 1319

## 2012-10-07 NOTE — ED Provider Notes (Signed)
Medical screening examination/treatment/procedure(s) were performed by a resident physician or non-physician practitioner and as the supervising physician I was immediately available for consultation/collaboration.  Clementeen Graham, MD   Rodolph Bong, MD 10/07/12 [phone]

## 2012-10-13 ENCOUNTER — Encounter: Payer: Self-pay | Admitting: *Deleted

## 2012-10-14 ENCOUNTER — Institutional Professional Consult (permissible substitution): Payer: PRIVATE HEALTH INSURANCE | Admitting: Surgery

## 2012-10-14 ENCOUNTER — Encounter: Payer: Self-pay | Admitting: Surgery

## 2012-10-14 VITALS — BP 142/82 | HR 75 | Resp 16 | Ht 60.0 in | Wt 180.0 lb

## 2012-10-15 ENCOUNTER — Encounter: Payer: Self-pay | Admitting: Surgery

## 2012-10-15 ENCOUNTER — Institutional Professional Consult (permissible substitution) (INDEPENDENT_AMBULATORY_CARE_PROVIDER_SITE_OTHER): Payer: PRIVATE HEALTH INSURANCE | Admitting: Surgery

## 2012-10-15 VITALS — BP 137/83 | HR 73 | Resp 20 | Ht 60.0 in | Wt 180.0 lb

## 2012-10-15 DIAGNOSIS — R911 Solitary pulmonary nodule: Secondary | ICD-10-CM

## 2012-10-15 DIAGNOSIS — J984 Other disorders of lung: Secondary | ICD-10-CM

## 2012-10-15 NOTE — Progress Notes (Signed)
[address]       [place] [address]             [phone]        PCP is Pearson Grippe, MD Referring Provider is [name]., MD  Chief Complaint  Patient presents with  . Lung Lesion    Surgical eval on right lower lobe lung nodule, Chest CT 09/23/12    HPI:  The patient is a 62 year old nonsmoker who presented to the ER on 09/10/2012 with left flank pain that she thought may be a kidney stone. She had an abdominal CT that showed an incompletely imaged right lower lobe nodule. A CT scan of the chest on 09/23/2012 showed a 6 mm right lower lobe nodule. There was a 1 cm prevascular lymph node.  She had a CT angio PE study in 11/2011 for shortness of breath after cholecystectomy and this showed no PE but did show bilateral lower lobe pneumonia.   Past Medical History  Diagnosis Date  . Depression   . HTN (hypertension)   . Rheumatic heart disease     s/p mitral valve repair 1980  . Hypercholesteremia   . Insomnia   . Anxiety   . DVT (deep venous thrombosis)   . Pain, joint, multiple sites   . Fibromyalgia   . Connective tissue disease   . Arthritis   . Heart murmur   . Mitral stenosis   . Personal history of urinary calculi   . PONV (postoperative nausea and vomiting)   . Peripheral vascular disease     hx dvt  . Stones in the urinary tract   . Refusal of blood transfusions as patient is Jehovah's Witness   . Nodule of right lung     RLL 6mm 09/23/12    Past Surgical History  Procedure Laterality Date  . Mitral valve repair  1980  . Tubal ligation  1980  . Vaginal hysterectomy  1992    for fibroids -- partial  . Cholecystectomy  11/27/2011    laproscopic  . Hernia repair  11/27/2011    laproscopic  . Cholecystectomy  11/27/2011    Procedure: LAPAROSCOPIC CHOLECYSTECTOMY WITH INTRAOPERATIVE CHOLANGIOGRAM;  Surgeon: Ernestene Mention, MD;  Location: [facility] OR;  Service: General;  Laterality: N/A;  laparoscopic cholecystectomy with choleangiogram  umbilical hernia repair  . Umbilical hernia repair  11/27/2011    Procedure: HERNIA REPAIR UMBILICAL ADULT;  Surgeon: Ernestene Mention, MD;  Location: [facility] OR;  Service: General;  Laterality: N/A;    Family History  Problem Relation Age of Onset  . Lupus    . Kidney failure    . Bone cancer    . Heart disease Mother     MI in her 26s  . ALS Mother   . Kidney disease Father   . Cancer Brother     bone  . Cancer Maternal Uncle     bone    Social History History  Substance Use Topics  . Smoking status: Never Smoker   . Smokeless tobacco: Never Used  . Alcohol Use: No    Current Outpatient Prescriptions  Medication Sig Dispense Refill  . amLODipine (NORVASC) 10 MG tablet Take 10 mg by mouth daily.      Marland Kitchen antiseptic oral rinse (BIOTENE) LIQD 15 mLs by Mouth Rinse route as needed (Oral hygiene).       Marland Kitchen aspirin 325 MG EC tablet Take 325 mg by mouth every  morning.       . cholecalciferol (VITAMIN D) 1000 UNITS tablet Take 1,000 Units by mouth every morning.       . Cinnamon 500 MG TABS Take 1,000 mg by mouth every morning.       . clonazePAM (KLONOPIN) 1 MG tablet Take 1 mg by mouth at bedtime.      . docusate sodium (COLACE) 50 MG capsule Take by mouth every evening.      . Eyelid Cleansers (HM EYELID WIPES EX) Apply 1 application topically every morning.      . famotidine (PEPCID) 20 MG tablet Take 20 mg by mouth 2 (two) times daily.        . furosemide (LASIX) 20 MG tablet Take 1 tablet (20 mg total) by mouth daily.  30 tablet  12  . gabapentin (NEURONTIN) 300 MG capsule Take 300 mg by mouth 3 (three) times daily.       . hydroxychloroquine (PLAQUENIL) 200 MG tablet Take 200-400 mg by mouth daily. Alternate days taking 1 pill, then 2 pills, then 1....      . metoprolol succinate (TOPROL-XL) 50 MG 24 hr tablet Take 50 mg by mouth at bedtime. Take with or immediately following a meal.      . omeprazole (PRILOSEC) 20 MG capsule Take 20 mg by mouth every morning.       Marland Kitchen  oxyCODONE-acetaminophen (PERCOCET/ROXICET) 5-325 MG per tablet Take 1 tablet by mouth every 4 (four) hours as needed for pain.  20 tablet  0  . Polyethyl Glycol-Propyl Glycol (SYSTANE) 0.4-0.3 % GEL Apply 1 drop to eye at bedtime.      Marland Kitchen tiZANidine (ZANAFLEX) 4 MG tablet Take 4 mg by mouth at bedtime.       . traMADol (ULTRAM) 50 MG tablet Take 100 mg by mouth 2 (two) times daily.        No current facility-administered medications for this visit.    Allergies  Allergen Reactions  . Statins Nausea Only and Other (See Comments)    Muscle cramps  . Sulfa Antibiotics Nausea And Vomiting    Review of Systems  HENT: Negative.   Eyes: Negative.   Respiratory: Negative.   Cardiovascular: Positive for palpitations and leg swelling.  Gastrointestinal: Negative.   Endocrine: Negative.   Genitourinary: Negative.   Musculoskeletal: Positive for myalgias, arthralgias and gait problem.  Skin: Negative.   Neurological:       Neuropathy with numbness in hands. Chronic pain.  Hematological: Negative.   Psychiatric/Behavioral: Positive for dysphoric mood. The patient is nervous/anxious.     BP 137/83  Pulse 73  Resp 20  Ht 5' (1.524 m)  Wt 180 lb (81.647 kg)  BMI 35.15 kg/m2  SpO2 97% Physical Exam  Constitutional: She is oriented to person, place, and time. She appears well-developed and well-nourished. No distress.  HENT:  Head: Normocephalic and atraumatic.  Mouth/Throat: Oropharynx is clear and moist.  Eyes: EOM are normal. Pupils are equal, round, and reactive to light.  Neck: Normal range of motion. Neck supple. No tracheal deviation present. No thyromegaly present.  Cardiovascular: Normal rate, regular rhythm, normal heart sounds and intact distal pulses.   No murmur heard. Pulmonary/Chest: Effort normal and breath sounds normal. No respiratory distress. She has no rales.  Abdominal: Soft. Bowel sounds are normal. She exhibits no distension and no mass. There is no tenderness.    Musculoskeletal: She exhibits no edema and no tenderness.  Lymphadenopathy:    She has no cervical  adenopathy.  Neurological: She is alert and oriented to person, place, and time. She has normal strength. No cranial nerve deficit or sensory deficit.  Skin: Skin is dry.  Psychiatric: She has a normal mood and affect.     Diagnostic Tests:  *RADIOLOGY REPORT*  Clinical Data: Follow up lung nodule  CT CHEST WITHOUT CONTRAST  Technique: Multidetector CT imaging of the chest was performed  following the standard protocol without IV contrast.  Comparison: 09/10/2012  Findings:  There is no pleural effusion identified. Pulmonary nodule within  the right lower lobe measures 6 mm, image 32/series 3. This is  unchanged from 09/10/2012. No additional suspicious nodules or  masses identified. No airspace consolidation or atelectasis  identified.  Normal heart size. No pericardial effusion identified.  Calcification of the myocardium within the left ventricular apex is  noted and may reflect prior infarct. Prevascular lymph node  measures 1 cm, image 18/series 2. This is compared with 8 mm  previously. Adjacent lymph node measures 6 mm, image 18/series 2.  Previously 7 mm.  No axillary or supraclavicular adenopathy. Low attenuation nodule  in the left lobe of thyroid gland measures 1.3 cm.  Limited imaging through the upper abdomen shows several low  attenuation structures within the left hepatic lobe which likely  represents small cysts. No acute findings identified. Prior  cholecystectomy.  Review of the visualized bony structures is unremarkable. No acute  lytic or sclerotic bone lesions.  IMPRESSION:  1. The pulmonary nodule the right lower lobe measures 6 mm. If the  patient is at high risk for bronchogenic carcinoma, follow-up chest  CT at 6-12 months is recommended. If the patient is at low risk  for bronchogenic carcinoma, follow-up chest CT at 12 months is  recommended. This  recommendation follows the consensus statement:  Guidelines for Management of Small Pulmonary Nodules Detected on CT  Scans: A Statement from the Fleischner Society as published in  Radiology 2005; 237:395-400.  2. Borderline enlarged prevascular lymph nodes appear similar to  previous exam. Attention on follow-up imaging advised.  Original Report Authenticated By: Signa Kell, M.D.    Impression:  She has a 6 mm nodule in the left lower lobe that could be a small lung cancer or it could be a residual scar from her bilateral lower lobe pneumonia last September. I have recommended continued followup with a repeat chest CT in 6 months. If there is progressive enlargement of this lesion I would plan to biopsy or remove it. I reviewed the scans with her and discussed my recommendations. She is in agreement.   Plan:  I will see her back in six months with a repeat CT scan of the chest.

## 2012-10-28 ENCOUNTER — Telehealth: Payer: Self-pay | Admitting: Cardiology

## 2012-10-28 NOTE — Telephone Encounter (Signed)
New problem    [place]/[employer] pt is having 5lb weight gain over 3 day period. Palpebal edema. Pt isn't urinating well. You can call the pt.

## 2012-10-28 NOTE — Telephone Encounter (Signed)
Left message for [place] that I had talked with the pt.

## 2012-10-28 NOTE — Telephone Encounter (Signed)
Spoke with pt, she is up in weight but she feels it is not related to fluid. She does have a little edema but she denies SOB. She will call me back if her symptoms do not improve or change.

## 2012-11-09 ENCOUNTER — Other Ambulatory Visit: Payer: Self-pay | Admitting: Internal Medicine

## 2012-11-12 ENCOUNTER — Encounter (HOSPITAL_COMMUNITY): Payer: Self-pay | Admitting: Emergency Medicine

## 2012-11-12 ENCOUNTER — Emergency Department (INDEPENDENT_AMBULATORY_CARE_PROVIDER_SITE_OTHER)
Admission: EM | Admit: 2012-11-12 | Discharge: 2012-11-12 | Disposition: A | Discharge status: Home / Self Care | Payer: PRIVATE HEALTH INSURANCE | Source: Home / Self Care

## 2012-11-12 DIAGNOSIS — R21 Rash and other nonspecific skin eruption: Secondary | ICD-10-CM

## 2012-11-12 MED ORDER — CLOTRIMAZOLE-BETAMETHASONE 1-0.05 % EX CREA: TOPICAL_CREAM | CUTANEOUS | Status: DC

## 2012-11-12 NOTE — ED Notes (Signed)
C/o rash on left arm.  Patient thought it was an insect bite but the rash started spreading.  Used OTC anti itch cream but the rash did not itch.  Denies any heat coming from arm.

## 2012-11-12 NOTE — ED Provider Notes (Signed)
CSN: [id number]     Arrival date & time 11/12/12  0957 History   None    Chief Complaint  Patient presents with  . Rash   (Consider location/radiation/quality/duration/timing/severity/associated sxs/prior Treatment) HPI Comments: 62 year old female presents with a rash to her left arm for one week. He started out as a small red spot to the left proximal forearm he continued to increase in size. She now has 5 roughly annular lesions to the extensor surface of the left forearm and upper arm. She denies that it itches. She denies local pain. There has been no draining or exudate.   Past Medical History  Diagnosis Date  . Depression   . HTN (hypertension)   . Rheumatic heart disease     s/p mitral valve repair 1980  . Hypercholesteremia   . Insomnia   . Anxiety   . DVT (deep venous thrombosis)   . Pain, joint, multiple sites   . Fibromyalgia   . Connective tissue disease   . Arthritis   . Heart murmur   . Mitral stenosis   . Personal history of urinary calculi   . PONV (postoperative nausea and vomiting)   . Peripheral vascular disease     hx dvt  . Stones in the urinary tract   . Refusal of blood transfusions as patient is Jehovah's Witness   . Nodule of right lung     RLL 6mm 09/23/12   Past Surgical History  Procedure Laterality Date  . Mitral valve repair  1980  . Tubal ligation  1980  . Vaginal hysterectomy  1992    for fibroids -- partial  . Cholecystectomy  11/27/2011    laproscopic  . Hernia repair  11/27/2011    laproscopic  . Cholecystectomy  11/27/2011    Procedure: LAPAROSCOPIC CHOLECYSTECTOMY WITH INTRAOPERATIVE CHOLANGIOGRAM;  Surgeon: Ernestene Mention, MD;  Location: [facility] OR;  Service: General;  Laterality: N/A;  laparoscopic cholecystectomy with choleangiogram umbilical hernia repair  . Umbilical hernia repair  11/27/2011    Procedure: HERNIA REPAIR UMBILICAL ADULT;  Surgeon: Ernestene Mention, MD;  Location: [facility] OR;  Service: General;  Laterality: N/A;   Family  History  Problem Relation Age of Onset  . Lupus    . Kidney failure    . Bone cancer    . Heart disease Mother     MI in her 22s  . ALS Mother   . Kidney disease Father   . Cancer Brother     bone  . Cancer Maternal Uncle     bone   History  Substance Use Topics  . Smoking status: Never Smoker   . Smokeless tobacco: Never Used  . Alcohol Use: No   OB History   Grav Para Term Preterm Abortions TAB SAB Ect Mult Living                 Review of Systems  Constitutional: Negative.   HENT: Negative.   Respiratory: Negative.   Gastrointestinal: Negative.   Skin: Positive for rash.  Neurological: Negative.   Hematological: Negative.     Allergies  Statins and Sulfa antibiotics  Home Medications   Current Outpatient Rx  Name  Route  Sig  Dispense  Refill  . amLODipine (NORVASC) 10 MG tablet   Oral   Take 10 mg by mouth daily.         Marland Kitchen antiseptic oral rinse (BIOTENE) LIQD   Mouth Rinse   15 mLs by Mouth Rinse route as needed (  Oral hygiene).          Marland Kitchen aspirin 325 MG EC tablet   Oral   Take 325 mg by mouth every morning.          . cholecalciferol (VITAMIN D) 1000 UNITS tablet   Oral   Take 1,000 Units by mouth every morning.          . Cinnamon 500 MG TABS   Oral   Take 1,000 mg by mouth every morning.          . clonazePAM (KLONOPIN) 1 MG tablet   Oral   Take 1 mg by mouth at bedtime.         . clotrimazole-betamethasone (LOTRISONE) cream      Apply to affected area 2 times daily prn   30 g   0   . docusate sodium (COLACE) 50 MG capsule   Oral   Take by mouth every evening.         . Eyelid Cleansers (HM EYELID WIPES EX)   Apply externally   Apply 1 application topically every morning.         . famotidine (PEPCID) 20 MG tablet   Oral   Take 20 mg by mouth 2 (two) times daily.           . furosemide (LASIX) 20 MG tablet   Oral   Take 1 tablet (20 mg total) by mouth daily.   30 tablet   12   . gabapentin (NEURONTIN)  300 MG capsule   Oral   Take 300 mg by mouth 3 (three) times daily.          . hydroxychloroquine (PLAQUENIL) 200 MG tablet   Oral   Take 200-400 mg by mouth daily. Alternate days taking 1 pill, then 2 pills, then 1....         . metoprolol succinate (TOPROL-XL) 50 MG 24 hr tablet   Oral   Take 50 mg by mouth at bedtime. Take with or immediately following a meal.         . omeprazole (PRILOSEC) 20 MG capsule   Oral   Take 20 mg by mouth every morning.          Marland Kitchen oxyCODONE-acetaminophen (PERCOCET/ROXICET) 5-325 MG per tablet   Oral   Take 1 tablet by mouth every 4 (four) hours as needed for pain.   20 tablet   0   . Polyethyl Glycol-Propyl Glycol (SYSTANE) 0.4-0.3 % GEL   Ophthalmic   Apply 1 drop to eye at bedtime.         Marland Kitchen tiZANidine (ZANAFLEX) 4 MG tablet   Oral   Take 4 mg by mouth at bedtime.          . traMADol (ULTRAM) 50 MG tablet   Oral   Take 100 mg by mouth 2 (two) times daily.           BP 135/84  Pulse 74  Temp(Src) 98.5 F (36.9 C) (Oral)  Resp 16  SpO2 97% Physical Exam  Nursing note and vitals reviewed. Constitutional: She is oriented to person, place, and time. She appears well-developed and well-nourished. No distress.  HENT:  Mouth/Throat: Oropharynx is clear and moist.  Eyes: Conjunctivae and EOM are normal.  Neck: Neck supple.  Pulmonary/Chest: Effort normal. No respiratory distress.  Musculoskeletal: She exhibits no edema.  Neurological: She is alert and oriented to person, place, and time.  Skin: Skin is warm and dry. She is not diaphoretic.  Under magnification the lesions appear to be densely cropped flesh-colored papules in a roughly annular pattern. 2 lesions to the proximal forearm are beginning to: S. There also isolated papules to the forearm that are not in any particular pattern. When stepping back in looking at the lesions there are circular lighter color the areas in the same area of the other lesions. Using a black  light the rash is not visible, does not willing color.  Psychiatric: She has a normal mood and affect.    ED Course  Procedures (including critical care time) Labs Review Labs Reviewed - No data to display Imaging Review No results found.  MDM   1. Rash and nonspecific skin eruption    Certain as to the etiology of this rash. There are some characteristics of inflammatory type reaction and other as fungal. She has no systemic symptoms. We will give her Lotrisone cream to use as directed. If not getting better in a week or if there is any worsening see your PCP in the office and may need referral to a dermatologist.  Hayden Rasmussen, NP 11/12/12 1048

## 2012-11-13 ENCOUNTER — Ambulatory Visit
Admission: RE | Admit: 2012-11-13 | Discharge: 2012-11-13 | Disposition: A | Discharge status: Home / Self Care | Payer: PRIVATE HEALTH INSURANCE | Source: Ambulatory Visit | Attending: Internal Medicine | Admitting: Internal Medicine

## 2012-11-14 NOTE — ED Provider Notes (Signed)
Medical screening examination/treatment/procedure(s) were performed by a resident physician or non-physician practitioner and as the supervising physician I was immediately available for consultation/collaboration.  Evan Corey, MD   Evan S Corey, MD 11/14/12 0759 

## 2012-12-24 ENCOUNTER — Other Ambulatory Visit: Payer: Self-pay | Admitting: Internal Medicine

## 2012-12-24 ENCOUNTER — Ambulatory Visit
Admission: RE | Admit: 2012-12-24 | Discharge: 2012-12-24 | Disposition: A | Discharge status: Home / Self Care | Payer: PRIVATE HEALTH INSURANCE | Source: Ambulatory Visit | Attending: Internal Medicine | Admitting: Internal Medicine

## 2012-12-24 DIAGNOSIS — Z86718 Personal history of other venous thrombosis and embolism: Secondary | ICD-10-CM

## 2012-12-24 DIAGNOSIS — M79605 Pain in left leg: Secondary | ICD-10-CM

## 2012-12-24 DIAGNOSIS — M7989 Other specified soft tissue disorders: Secondary | ICD-10-CM

## 2013-01-15 ENCOUNTER — Encounter (HOSPITAL_COMMUNITY): Payer: Self-pay | Admitting: Psychiatry

## 2013-01-15 ENCOUNTER — Ambulatory Visit (INDEPENDENT_AMBULATORY_CARE_PROVIDER_SITE_OTHER): Payer: PRIVATE HEALTH INSURANCE | Admitting: Psychiatry

## 2013-01-15 VITALS — BP 160/80 | Ht 60.0 in | Wt 178.0 lb

## 2013-01-15 DIAGNOSIS — F339 Major depressive disorder, recurrent, unspecified: Secondary | ICD-10-CM

## 2013-01-15 DIAGNOSIS — F332 Major depressive disorder, recurrent severe without psychotic features: Secondary | ICD-10-CM

## 2013-01-15 MED ORDER — CLONAZEPAM 1 MG PO TABS: 1.0000 mg | ORAL_TABLET | Freq: Two times a day (BID) | ORAL | Status: DC

## 2013-01-15 MED ORDER — PAROXETINE HCL 20 MG PO TABS: 20.0000 mg | ORAL_TABLET | Freq: Every day | ORAL | Status: DC
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
## 2013-01-20 ENCOUNTER — Encounter: Payer: Self-pay | Admitting: Nurse Practitioner

## 2013-01-20 ENCOUNTER — Ambulatory Visit (INDEPENDENT_AMBULATORY_CARE_PROVIDER_SITE_OTHER): Payer: PRIVATE HEALTH INSURANCE | Admitting: Nurse Practitioner

## 2013-01-20 ENCOUNTER — Telehealth: Payer: Self-pay | Admitting: Cardiology

## 2013-01-20 ENCOUNTER — Encounter (INDEPENDENT_AMBULATORY_CARE_PROVIDER_SITE_OTHER): Payer: PRIVATE HEALTH INSURANCE

## 2013-01-20 ENCOUNTER — Other Ambulatory Visit: Payer: Self-pay | Admitting: *Deleted

## 2013-01-20 VITALS — BP 170/100 | HR 76 | Ht 61.0 in | Wt 172.1 lb

## 2013-01-20 DIAGNOSIS — R002 Palpitations: Secondary | ICD-10-CM

## 2013-01-20 DIAGNOSIS — F411 Generalized anxiety disorder: Secondary | ICD-10-CM

## 2013-01-20 DIAGNOSIS — I350 Nonrheumatic aortic (valve) stenosis: Secondary | ICD-10-CM

## 2013-01-20 DIAGNOSIS — I359 Nonrheumatic aortic valve disorder, unspecified: Secondary | ICD-10-CM

## 2013-01-20 DIAGNOSIS — R911 Solitary pulmonary nodule: Secondary | ICD-10-CM

## 2013-01-20 DIAGNOSIS — R079 Chest pain, unspecified: Secondary | ICD-10-CM

## 2013-01-20 LAB — CBC WITH DIFFERENTIAL/PLATELET
Basophils Absolute: 0 10*3/uL (ref 0.0–0.1)
Basophils Relative: 0.6 % (ref 0.0–3.0)
Eosinophils Absolute: 0.1 10*3/uL (ref 0.0–0.7)
Eosinophils Relative: 1.4 % (ref 0.0–5.0)
HCT: 37.8 % (ref 36.0–46.0)
Hemoglobin: 12.4 g/dL (ref 12.0–15.0)
Lymphocytes Relative: 36.3 % (ref 12.0–46.0)
Lymphs Abs: 2.1 10*3/uL (ref 0.7–4.0)
MCHC: 32.8 g/dL (ref 30.0–36.0)
MCV: 81.9 fl (ref 78.0–100.0)
Monocytes Absolute: 0.6 10*3/uL (ref 0.1–1.0)
Monocytes Relative: 9.8 % (ref 3.0–12.0)
Neutro Abs: 2.9 10*3/uL (ref 1.4–7.7)
Neutrophils Relative %: 51.9 % (ref 43.0–77.0)
Platelets: 223 10*3/uL (ref 150.0–400.0)
RBC: 4.62 Mil/uL (ref 3.87–5.11)
RDW: 14.1 % (ref 11.5–14.6)
WBC: 5.7 10*3/uL (ref 4.5–10.5)

## 2013-01-20 LAB — BASIC METABOLIC PANEL
BUN: 11 mg/dL (ref 6–23)
CO2: 26 mEq/L (ref 19–32)
Calcium: 9.8 mg/dL (ref 8.4–10.5)
Chloride: 106 mEq/L (ref 96–112)
Creatinine, Ser: 1.1 mg/dL (ref 0.4–1.2)
GFR: 62.42 mL/min (ref >60.00)
Glucose, Bld: 120 mg/dL — ABNORMAL HIGH (ref 70–99)
Potassium: 3.7 mEq/L (ref 3.5–5.1)
Sodium: 139 mEq/L (ref 135–145)

## 2013-01-20 LAB — HEPATIC FUNCTION PANEL
ALT: 18 U/L (ref 0–35)
AST: 28 U/L (ref 0–37)
Albumin: 4.3 g/dL (ref 3.5–5.2)
Alkaline Phosphatase: 71 U/L (ref 39–117)
Bilirubin, Direct: 0 mg/dL (ref 0.0–0.3)
Total Bilirubin: 0.3 mg/dL (ref 0.3–1.2)
Total Protein: 8 g/dL (ref 6.0–8.3)

## 2013-01-20 LAB — TSH: TSH: 1.93 u[IU]/mL (ref 0.35–5.50)

## 2013-01-20 MED ORDER — METOPROLOL SUCCINATE ER 50 MG PO TB24: ORAL_TABLET | ORAL | Status: DC

## 2013-01-20 NOTE — Patient Instructions (Addendum)
Needs noncontrast CT of the chest in January  Needs a 24 hour Holter  Needs stress test (Lexiscan)  Check labs today  Increase the Toprol to 1/2 tab in the am (25mg ) and continue a whole tab at night (50mg )  Monitor your blood pressure at home - keep a record and bring to your next visit with your BP cuff  I will see you in 2 weeks (after studies complete)  Keep minimizing your salt  Call the [facility] office at [phone] if you have any questions, problems or concerns.

## 2013-01-20 NOTE — Telephone Encounter (Signed)
Spoke with patient who c/o yesterday afternoon woke up from nap with heart racing - took Toprol and felt like it slowed her heart down and she felt fine until this morning when she started getting dressed.  Patient states when she started getting dressed this morning she felt SOB.  Patient states she has had some chest pain, headache, and nausea (no vomiting) intermittently over the past 3 days. Patient states on several occasions over the past 2 weeks she has experienced jaw pain.  Patient reports BP today is 128/80.  Patient would like to be seen today.  I advised patient that Dr. Ludwig Clarks schedule is full today but offered her an appointment this afternoon with Norma Fredrickson, NP.  Patient states she will come in at 2:30 to see Lawson Fiscal unless she begins to feel worse at which time she will call EMS to be transported to the hospital.  I agreed with patient and asked her to call our office if she does decide to go to the hospital.  Patient verbalized understanding and agreement.

## 2013-01-20 NOTE — Progress Notes (Signed)
Alexandria Leach Date of Birth: 06/15/1950 Medical Record #[id number]  History of Present Illness: Alexandria Leach is seen back today for a work in visit. She is seen for Dr. Jens Som. She has a history of MV repair secondary to rheumatic heart disease back in 1980 per Dr. Nedra Hai. Her other problems include HTN, GERD, fibromyalgia, anxiety, DVT (off coumadin since 2011), connective tissue disease, and HLD.   She has had her gallbladder removed and hernia repair back in 2013. Had post op fever/pneumonia. Was told it was heart failure. HCTZ was switched to Lasix at that time. Seen back in July by Dr. Jens Som and seemed to be doing ok. Had her echo updated - EF remains normal. No known CAD that I can see.   Called today with a multitude of complaints. Has not really been feeling well for at least a month. Has had some chest pressure over the past month - initially started with walking but now just sporadic. She tries to walk on a daily basis. Has had some facial pain over the past week. No fever or chills or cough. Does not feel like it is from her sinuses. Came in here today because of heart racing for the last 2 days - makes her weak. Took her Toprol earlier yesterday with some improvement. She is scared. Has been trying to eat better - weight is down 10 pounds. Admits that she was using too much salt. BP at home is much lower. Cuff has not been checked for correlation.   Current Outpatient Prescriptions  Medication Sig Dispense Refill  . amLODipine (NORVASC) 10 MG tablet Take 10 mg by mouth daily.      Marland Kitchen antiseptic oral rinse (BIOTENE) LIQD 15 mLs by Mouth Rinse route as needed (Oral hygiene).       Marland Kitchen aspirin 325 MG EC tablet Take 325 mg by mouth every morning.       . cholecalciferol (VITAMIN D) 1000 UNITS tablet Take 1,000 Units by mouth every morning.       . Cinnamon 500 MG TABS Take 1,000 mg by mouth every morning.       . clonazePAM (KLONOPIN) 1 MG tablet Take 1 tablet (1 mg total) by mouth 2 (two)  times daily.  60 tablet  2  . clotrimazole-betamethasone (LOTRISONE) cream Apply to affected area 2 times daily prn  30 g  0  . docusate sodium (COLACE) 50 MG capsule Take by mouth every evening.      . Eyelid Cleansers (HM EYELID WIPES EX) Apply 1 application topically every morning.      . famotidine (PEPCID) 20 MG tablet Take 20 mg by mouth 2 (two) times daily.        . furosemide (LASIX) 20 MG tablet Take 1 tablet (20 mg total) by mouth daily.  30 tablet  12  . gabapentin (NEURONTIN) 300 MG capsule Take 300 mg by mouth 3 (three) times daily.       . hydroxychloroquine (PLAQUENIL) 200 MG tablet Take 200-400 mg by mouth daily. Alternate days taking 1 pill, then 2 pills, then 1....      . metoprolol succinate (TOPROL-XL) 50 MG 24 hr tablet Take 50 mg by mouth at bedtime. Take with or immediately following a meal.      . omeprazole (PRILOSEC) 20 MG capsule Take 20 mg by mouth every morning.       Marland Kitchen oxyCODONE-acetaminophen (PERCOCET/ROXICET) 5-325 MG per tablet Take 1 tablet by mouth every 4 (four) hours as needed  for pain.  20 tablet  0  . PARoxetine (PAXIL) 20 MG tablet Take 1 tablet (20 mg total) by mouth daily.  30 tablet  2  . Polyethyl Glycol-Propyl Glycol (SYSTANE) 0.4-0.3 % GEL Apply 1 drop to eye at bedtime.      Marland Kitchen tiZANidine (ZANAFLEX) 4 MG tablet Take 4 mg by mouth at bedtime.       . traMADol (ULTRAM) 50 MG tablet Take 100 mg by mouth 2 (two) times daily.        No current facility-administered medications for this visit.    Allergies  Allergen Reactions  . Cymbalta [Duloxetine Hcl]     tachycardia  . Statins Nausea Only and Other (See Comments)    Muscle cramps  . Sulfa Antibiotics Nausea And Vomiting    Past Medical History  Diagnosis Date  . Depression   . HTN (hypertension)   . Rheumatic heart disease     s/p mitral valve repair 1980  . Hypercholesteremia   . Insomnia   . Anxiety   . DVT (deep venous thrombosis)   . Pain, joint, multiple sites   . Fibromyalgia     . Connective tissue disease   . Arthritis   . Heart murmur   . Mitral stenosis   . Personal history of urinary calculi   . PONV (postoperative nausea and vomiting)   . Peripheral vascular disease     hx dvt  . Stones in the urinary tract   . Refusal of blood transfusions as patient is Jehovah's Witness   . Nodule of right lung     RLL 6mm 09/23/12  . Sjogren's syndrome     Past Surgical History  Procedure Laterality Date  . Mitral valve repair  1980  . Tubal ligation  1980  . Vaginal hysterectomy  1992    for fibroids -- partial  . Cholecystectomy  11/27/2011    laproscopic  . Hernia repair  11/27/2011    laproscopic  . Cholecystectomy  11/27/2011    Procedure: LAPAROSCOPIC CHOLECYSTECTOMY WITH INTRAOPERATIVE CHOLANGIOGRAM;  Surgeon: Ernestene Mention, MD;  Location: [facility] OR;  Service: General;  Laterality: N/A;  laparoscopic cholecystectomy with choleangiogram umbilical hernia repair  . Umbilical hernia repair  11/27/2011    Procedure: HERNIA REPAIR UMBILICAL ADULT;  Surgeon: Ernestene Mention, MD;  Location: [facility] OR;  Service: General;  Laterality: N/A;    History  Smoking status  . Never Smoker   Smokeless tobacco  . Never Used    History  Alcohol Use No    Family History  Problem Relation Age of Onset  . Lupus    . Kidney failure    . Bone cancer    . Heart disease Mother     MI in her 18s  . ALS Mother   . Kidney disease Father   . Alcohol abuse Father   . Cancer Brother     bone  . Cancer Maternal Uncle     bone  . Depression Sister   . Drug abuse Sister   . Depression Sister   . Depression Sister   . Depression Sister   . Depression Sister     Review of Systems: The review of systems is per the HPI.  All other systems were reviewed and are negative.  Physical Exam: BP 170/100  Pulse 76  Ht 5\' 1"  (1.549 m)  Wt 172 lb 1.9 oz (78.073 kg)  BMI 32.54 kg/m2 Patient is very pleasant and in no acute  distress. Little anxious. Weight is down 10 pounds since  July. BP by me is 160/90. Skin is warm and dry. Color is normal.  HEENT is unremarkable. Normocephalic/atraumatic. PERRL. Sclera are nonicteric. Neck is supple. No masses. No JVD. Lungs are clear. Cardiac exam shows a regular rate and rhythm. Abdomen is soft. Extremities are without edema. Gait and ROM are intact. No gross neurologic deficits noted.  LABORATORY DATA: EKG today shows sinus rhythm with nonspecific T wave changes - unchanged.   Lab Results  Component Value Date   WBC 7.5 12/06/2011   HGB 12.1 12/06/2011   HCT 38.3 12/06/2011   PLT 384.0 12/06/2011   GLUCOSE 85 09/10/2012   CHOL 267* 03/23/2008   TRIG 124 03/23/2008   HDL 65 03/23/2008   LDLCALC 161* 03/23/2008   ALT 51* 12/06/2011   AST 42* 12/06/2011   NA 139 09/10/2012   K 3.5 09/10/2012   CL 105 09/10/2012   CREATININE 1.01 09/10/2012   BUN 8 09/10/2012   CO2 23 09/10/2012   TSH 3.27 12/06/2011   INR 1.00 11/25/2011   Echo Study Conclusions from July of 2014  - Left ventricle: The cavity size was normal. Wall thickness was increased in a pattern of mild LVH. Systolic function was normal. The estimated ejection fraction was in the range of 55% to 60%. Wall motion was normal; there were no regional wall motion abnormalities. - Aortic valve: There was very mild stenosis. - Mitral valve: Prior procedures included surgical repair. Leaflet separation was moderately reduced. The findings are consistent with mild to moderate stenosis. Mild regurgitation. - Left atrium: The atrium was moderately dilated.   Assessment / Plan: 1. Palpitations - will place 24 hour Holter - this has happened daily for the last 2 days - hopefully we will be able to "catch". Toprol increased to 25 mg in the am and 50 mg in the pm.   2. HTN - BP is up - says she has better control at home - will have her monitor - see her back to check her cuff. Metoprolol is increased today.   3. Chest pain/facial pain - no known CAD - will arrange for Myoview.   4.  Fibromyalgia  5. HLD with past LFTs elevated  Will recheck her labs today. Place 24 hour Holter. Metoprolol is increased. Arrange for [facility]. See her back for discussion.   Patient is agreeable to this plan and will call if any problems develop in the interim.   Rosalio Macadamia, RN, ANP-C [facility] [address] [place], [place]  [address]

## 2013-01-20 NOTE — Telephone Encounter (Signed)
New problem    Pt is felling heaviness in her chest this started today, SOB, nauseas for the pass 3 days.    SOB  started in her sleep 5:30 pm 11/4.  Pt would like to be seen today.

## 2013-01-21 ENCOUNTER — Other Ambulatory Visit: Payer: Self-pay

## 2013-01-28 ENCOUNTER — Telehealth: Payer: Self-pay | Admitting: *Deleted

## 2013-01-28 NOTE — Telephone Encounter (Signed)
Spoke with pt, aware monitor reviewed by dr Jens Som shows sinus with occ PAC/PVC. Brief ( 6 beats) PAT.

## 2013-02-02 ENCOUNTER — Ambulatory Visit (HOSPITAL_COMMUNITY): Payer: Self-pay | Admitting: Psychiatry

## 2013-02-09 ENCOUNTER — Encounter: Payer: Self-pay | Admitting: Cardiology

## 2013-02-09 ENCOUNTER — Ambulatory Visit (HOSPITAL_COMMUNITY): Payer: PRIVATE HEALTH INSURANCE | Attending: Cardiology | Admitting: Radiology

## 2013-02-09 VITALS — BP 161/85 | HR 73 | Ht 61.0 in | Wt 174.0 lb

## 2013-02-09 DIAGNOSIS — R0600 Dyspnea, unspecified: Secondary | ICD-10-CM

## 2013-02-09 DIAGNOSIS — R0609 Other forms of dyspnea: Secondary | ICD-10-CM | POA: Insufficient documentation

## 2013-02-09 DIAGNOSIS — I350 Nonrheumatic aortic (valve) stenosis: Secondary | ICD-10-CM

## 2013-02-09 DIAGNOSIS — R002 Palpitations: Secondary | ICD-10-CM | POA: Insufficient documentation

## 2013-02-09 DIAGNOSIS — R0989 Other specified symptoms and signs involving the circulatory and respiratory systems: Secondary | ICD-10-CM | POA: Insufficient documentation

## 2013-02-09 DIAGNOSIS — I359 Nonrheumatic aortic valve disorder, unspecified: Secondary | ICD-10-CM | POA: Insufficient documentation

## 2013-02-09 DIAGNOSIS — F411 Generalized anxiety disorder: Secondary | ICD-10-CM

## 2013-02-09 DIAGNOSIS — R0602 Shortness of breath: Secondary | ICD-10-CM

## 2013-02-09 DIAGNOSIS — R079 Chest pain, unspecified: Secondary | ICD-10-CM

## 2013-02-09 MED ORDER — TECHNETIUM TC 99M SESTAMIBI GENERIC - CARDIOLITE
30.0000 | Freq: Once | INTRAVENOUS | Status: AC | PRN
  Administered 2013-02-09: 30 via INTRAVENOUS

## 2013-02-09 MED ORDER — TECHNETIUM TC 99M SESTAMIBI GENERIC - CARDIOLITE
10.0000 | Freq: Once | INTRAVENOUS | Status: AC | PRN
  Administered 2013-02-09: 10 via INTRAVENOUS

## 2013-02-09 MED ORDER — REGADENOSON 0.4 MG/5ML IV SOLN
0.4000 mg | Freq: Once | INTRAVENOUS | Status: AC
  Administered 2013-02-09: 0.4 mg via INTRAVENOUS

## 2013-02-09 NOTE — Progress Notes (Signed)
[facility] SITE 3 NUCLEAR MED [address] [place], [place] [address] [phone]    Cardiology Nuclear Med Study  Alexandria Leach is a 62 y.o. female     MRN : [id number]     DOB: [birth year]  Procedure Date: 02/09/2013  Nuclear Med Background Indication for Stress Test:  Evaluation for Ischemia History:  MVR 1980, Heart Cath, Echo 2014 EF 55-60% Cardiac Risk Factors: Family History - CAD, Hypertension, Lipids, NIDDM and DVT  Symptoms:  Chest Pain with Exertion; palps   Nuclear Pre-Procedure Caffeine/Decaff Intake:  None > 12hours NPO After: 7:00pm   Lungs:  clear O2 Sat: 97% on room air. IV 0.9% NS with Angio Cath:  22g  IV Site: R Forearm  IV Started by:  Rickard Patience  Chest Size (in):  38 Cup Size: D  Height: 5\' 1"  (1.549 m)  Weight:  174 lb (78.926 kg)  BMI:  Body mass index is 32.89 kg/(m^2). Tech Comments:  Held Toprol and Lasix as instructed.    Nuclear Med Study 1 or 2 day study: 1 day  Stress Test Type:  Treadmill/Lexiscan  Reading MD: Nelson,Katarina,MD  Order Authorizing Provider:  Crenshaw,Brian,MD  Resting Radionuclide: Technetium 22m Sestamibi  Resting Radionuclide Dose: 11.0 mCi   Stress Radionuclide:  Technetium 68m Sestamibi  Stress Radionuclide Dose: 33.0 mCi           Stress Protocol Rest HR: 73 Stress HR: 97  Rest BP: 161/85 Stress BP: 172/67  Exercise Time (min): n/a METS: n/a   Predicted Max HR: 160 bpm % Max HR: 60.62 bpm Rate Pressure Product: 21308   Dose of Adenosine (mg):  n/a Dose of Lexiscan: 0.4 mg  Dose of Atropine (mg): n/a Dose of Dobutamine: n/a mcg/kg/min (at max HR)  Stress Test Technologist: Nelson Chimes, BS-ES  Nuclear Technologist:  Domenic Polite, CNMT     Rest Procedure:  Myocardial perfusion imaging was performed at rest 45 minutes following the intravenous administration of Technetium 44m Sestamibi. Rest ECG: NSR - Normal EKG  Stress Procedure:  The patient received IV Lexiscan 0.4 mg over  15-seconds.  Technetium 79m Sestamibi injected at 30-seconds.  Quantitative spect images were obtained after a 45 minute delay.  During the infusion of Lexiscan, patient complained of lightheadedness.  Symptoms resolved in recovery.  Stress ECG: No significant change from baseline ECG  QPS Raw Data Images:  Normal; no motion artifact; normal heart/lung ratio. Stress Images:  Normal homogeneous uptake in all areas of the myocardium. Rest Images:  Normal homogeneous uptake in all areas of the myocardium. Subtraction (SDS):  There is no evidence of scar or ischemia. Transient Ischemic Dilatation (Normal <1.22):  1.01 Lung/Heart Ratio (Normal <0.45):  0.43  Quantitative Gated Spect Images QGS EDV:  72 ml QGS ESV:  25 ml  Impression Exercise Capacity:  Lexiscan with low level exercise. BP Response:  Hypotensive blood pressure response. Clinical Symptoms:  Lightheaded.  ECG Impression:  No significant ST segment change suggestive of ischemia. Comparison with Prior Nuclear Study: No images to compare  Overall Impression:  Normal stress nuclear study.  LV Ejection Fraction: 66%.  LV Wall Motion:  NL LV Function; NL Wall Motion  [name] 02/09/2013

## 2013-02-10 ENCOUNTER — Ambulatory Visit (INDEPENDENT_AMBULATORY_CARE_PROVIDER_SITE_OTHER): Payer: PRIVATE HEALTH INSURANCE | Admitting: Psychiatry

## 2013-02-10 ENCOUNTER — Encounter (HOSPITAL_COMMUNITY): Payer: Self-pay | Admitting: Psychiatry

## 2013-02-10 VITALS — BP 110/70 | Ht 61.0 in | Wt 174.0 lb

## 2013-02-10 DIAGNOSIS — F339 Major depressive disorder, recurrent, unspecified: Secondary | ICD-10-CM

## 2013-02-10 DIAGNOSIS — F332 Major depressive disorder, recurrent severe without psychotic features: Secondary | ICD-10-CM
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
## 2013-02-15 ENCOUNTER — Encounter: Payer: Self-pay | Admitting: Nurse Practitioner

## 2013-02-15 ENCOUNTER — Ambulatory Visit (INDEPENDENT_AMBULATORY_CARE_PROVIDER_SITE_OTHER): Payer: PRIVATE HEALTH INSURANCE | Admitting: Nurse Practitioner

## 2013-02-15 VITALS — BP 130/78 | HR 78 | Ht 61.0 in | Wt 175.4 lb

## 2013-02-15 DIAGNOSIS — I10 Essential (primary) hypertension: Secondary | ICD-10-CM

## 2013-02-15 NOTE — Progress Notes (Signed)
Alexandria Leach Date of Birth: January 28, 1951 Medical Record #[id number]  History of Present Illness: Alexandria Leach is seen back today for a follow up visit. She is seen for Dr. Jens Som. She has a history of MV repair secondary to rheumatic heart disease back in 1980 per Dr. Nedra Hai. Her other problems include HTN, GERD, fibromyalgia, anxiety, DVT (off coumadin since 2011), connective tissue disease, and HLD.   She has had her gallbladder removed and hernia repair back in 2013. Had post op fever/pneumonia. Was told it was heart failure. HCTZ was switched to Lasix at that time. Seen back in July by Dr. Jens Som and seemed to be doing ok. Had her echo updated - EF remains normal. No known CAD that I could see.   Seen 2 weeks ago with a multitude of complaints. Had not really been feeling well for at least a month. Had had some chest pressure over the past month - initially started with walking but now just sporadic. Had had some facial pain over the past week. Also complaining of heart racing for the last 2 days - makes her weak. Admitted that she was using too much salt. I updated her myoview. Checked a Holter (this showed just occasional PAC/PVC with sinus) and increased her beta blocker.   Comes back today. Here alone. Doing much better. Feels better. Not eating "as much junk" as she was previously. Palpitations are better. BP is better. No chest pain. Was given some Metformin by Dr. Selena Batten (A1C was 6.8) - she has not taken - wants to try diet/exercise/weight loss/cinnamon. BP cuff did NOT match up.   Current Outpatient Prescriptions  Medication Sig Dispense Refill  . amLODipine (NORVASC) 10 MG tablet Take 10 mg by mouth daily.      Marland Kitchen antiseptic oral rinse (BIOTENE) LIQD 15 mLs by Mouth Rinse route as needed (Oral hygiene).       Marland Kitchen aspirin 325 MG EC tablet Take 325 mg by mouth every morning.       . cholecalciferol (VITAMIN D) 1000 UNITS tablet Take 1,000 Units by mouth every morning.       . Cinnamon 500 MG  TABS Take 1,000 mg by mouth every morning.       . clonazePAM (KLONOPIN) 1 MG tablet Take 1 tablet (1 mg total) by mouth 2 (two) times daily.  60 tablet  2  . cycloSPORINE (RESTASIS) 0.05 % ophthalmic emulsion 1 drop 2 (two) times daily.      Marland Kitchen docusate sodium (COLACE) 50 MG capsule Take by mouth every evening.      . famotidine (PEPCID) 20 MG tablet Take 20 mg by mouth 2 (two) times daily.        . furosemide (LASIX) 20 MG tablet Take 1 tablet (20 mg total) by mouth daily.  30 tablet  12  . gabapentin (NEURONTIN) 300 MG capsule Take 300 mg by mouth 3 (three) times daily.       . hydroxychloroquine (PLAQUENIL) 200 MG tablet Take 200-400 mg by mouth daily. Alternate days taking 1 pill, then 2 pills, then 1....      . metoprolol succinate (TOPROL-XL) 50 MG 24 hr tablet 1/2 tablet in the am and whole tablet at night.      Marland Kitchen omeprazole (PRILOSEC) 20 MG capsule Take 20 mg by mouth every morning.       Marland Kitchen PARoxetine (PAXIL) 20 MG tablet Take 1 tablet (20 mg total) by mouth daily.  30 tablet  2  . Polyethyl Glycol-Propyl  Glycol (SYSTANE) 0.4-0.3 % GEL Apply 1 drop to eye at bedtime.      Marland Kitchen tiZANidine (ZANAFLEX) 4 MG tablet Take 4 mg by mouth at bedtime.       . traMADol (ULTRAM) 50 MG tablet Take 100 mg by mouth 2 (two) times daily.        No current facility-administered medications for this visit.    Allergies  Allergen Reactions  . Cymbalta [Duloxetine Hcl]     tachycardia  . Statins Nausea Only and Other (See Comments)    Muscle cramps  . Sulfa Antibiotics Nausea And Vomiting    Past Medical History  Diagnosis Date  . Depression   . HTN (hypertension)   . Rheumatic heart disease     s/p mitral valve repair 1980  . Hypercholesteremia   . Insomnia   . Anxiety   . DVT (deep venous thrombosis)   . Pain, joint, multiple sites   . Fibromyalgia   . Connective tissue disease   . Arthritis   . Heart murmur   . Mitral stenosis   . Personal history of urinary calculi   . PONV  (postoperative nausea and vomiting)   . Peripheral vascular disease     hx dvt  . Stones in the urinary tract   . Refusal of blood transfusions as patient is Jehovah's Witness   . Nodule of right lung     RLL 6mm 09/23/12  . Sjogren's syndrome     Past Surgical History  Procedure Laterality Date  . Mitral valve repair  1980  . Tubal ligation  1980  . Vaginal hysterectomy  1992    for fibroids -- partial  . Cholecystectomy  11/27/2011    laproscopic  . Hernia repair  11/27/2011    laproscopic  . Cholecystectomy  11/27/2011    Procedure: LAPAROSCOPIC CHOLECYSTECTOMY WITH INTRAOPERATIVE CHOLANGIOGRAM;  Surgeon: Ernestene Mention, MD;  Location: [facility];  Service: General;  Laterality: N/A;  laparoscopic cholecystectomy with choleangiogram umbilical hernia repair  . Umbilical hernia repair  11/27/2011    Procedure: HERNIA REPAIR UMBILICAL ADULT;  Surgeon: Ernestene Mention, MD;  Location: [facility] OR;  Service: General;  Laterality: N/A;    History  Smoking status  . Never Smoker   Smokeless tobacco  . Never Used    History  Alcohol Use No    Family History  Problem Relation Age of Onset  . Lupus    . Kidney failure    . Bone cancer    . Heart disease Mother     MI in her 34s  . ALS Mother   . Kidney disease Father   . Alcohol abuse Father   . Cancer Brother     bone  . Cancer Maternal Uncle     bone  . Depression Sister   . Drug abuse Sister   . Depression Sister   . Depression Sister   . Depression Sister   . Depression Sister     Review of Systems: The review of systems is per the HPI.  All other systems were reviewed and are negative.  Physical Exam: BP 130/78  Pulse 78  Ht 5\' 1"  (1.549 m)  Wt 175 lb 6.4 oz (79.561 kg)  BMI 33.16 kg/m2  SpO2 100% Patient is very pleasant and in no acute distress. Skin is warm and dry. Color is normal.  HEENT is unremarkable. Normocephalic/atraumatic. PERRL. Sclera are nonicteric. Neck is supple. No masses. No JVD. Lungs are clear.  Cardiac  exam shows a regular rate and rhythm. Abdomen is soft. Extremities are without edema. Gait and ROM are intact. No gross neurologic deficits noted.  Wt Readings from Last 3 Encounters:  02/15/13 175 lb 6.4 oz (79.561 kg)  02/10/13 174 lb (78.926 kg)  02/09/13 174 lb (78.926 kg)     LABORATORY DATA: Lab Results  Component Value Date   WBC 5.7 01/20/2013   HGB 12.4 01/20/2013   HCT 37.8 01/20/2013   PLT 223.0 01/20/2013   GLUCOSE 120* 01/20/2013   CHOL 267* 03/23/2008   TRIG 124 03/23/2008   HDL 65 03/23/2008   LDLCALC 811* 03/23/2008   ALT 18 01/20/2013   AST 28 01/20/2013   NA 139 01/20/2013   K 3.7 01/20/2013   CL 106 01/20/2013   CREATININE 1.1 01/20/2013   BUN 11 01/20/2013   CO2 26 01/20/2013   TSH 1.93 01/20/2013   INR 1.00 11/25/2011   Myoview Impression  Exercise Capacity: Lexiscan with low level exercise.  BP Response: Hypotensive blood pressure response.  Clinical Symptoms: Lightheaded.  ECG Impression: No significant ST segment change suggestive of ischemia.  Comparison with Prior Nuclear Study: No images to compare  Overall Impression: Normal stress nuclear study.  LV Ejection Fraction: 66%. LV Wall Motion: NL LV Function; NL Wall Motion  [name]  02/09/2013    Assessment / Plan: 1. Palpitations - on more Toprol now - her symptoms have improved. No significant arrhythmia noted on Holter.   2. Chest pain - normal Myoview - no further symptoms  3. HTN - on more Toprol now - BP looks better.   Will see her back in July as planned. No change in therapy. I did ask her to touch base with Dr. Selena Batten about her not wanting to take the metformin.  Patient is agreeable to this plan and will call if any problems develop in the interim.   Rosalio Macadamia, RN, ANP-C [facility] [address] [place], [place]  [address]

## 2013-02-15 NOTE — Patient Instructions (Addendum)
Your stress test looks good  Your blood pressure looks better  Stay on your current medicines  Stay active and keep working on your diet.  See Dr. Jens Som in July as planned  Call the [facility] office at [phone] if you have any questions, problems or concerns.

## 2013-02-19 ENCOUNTER — Ambulatory Visit (HOSPITAL_COMMUNITY): Payer: PRIVATE HEALTH INSURANCE | Admitting: Psychiatry

## 2013-03-01 ENCOUNTER — Ambulatory Visit (INDEPENDENT_AMBULATORY_CARE_PROVIDER_SITE_OTHER): Payer: PRIVATE HEALTH INSURANCE | Admitting: Psychiatry

## 2013-03-01 DIAGNOSIS — F339 Major depressive disorder, recurrent, unspecified: Secondary | ICD-10-CM

## 2013-03-03 NOTE — Patient Instructions (Signed)
Discussed orally 
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
## 2013-03-15 ENCOUNTER — Ambulatory Visit (INDEPENDENT_AMBULATORY_CARE_PROVIDER_SITE_OTHER): Payer: PRIVATE HEALTH INSURANCE | Admitting: Psychiatry

## 2013-03-15 DIAGNOSIS — F339 Major depressive disorder, recurrent, unspecified: Secondary | ICD-10-CM

## 2013-03-15 NOTE — Patient Instructions (Signed)
Discussed orally 
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
## 2013-03-22 ENCOUNTER — Other Ambulatory Visit: Payer: Self-pay | Admitting: Cardiology

## 2013-03-26 ENCOUNTER — Ambulatory Visit (INDEPENDENT_AMBULATORY_CARE_PROVIDER_SITE_OTHER): Payer: PRIVATE HEALTH INSURANCE | Admitting: Psychiatry

## 2013-03-26 ENCOUNTER — Encounter (HOSPITAL_COMMUNITY): Payer: Self-pay | Admitting: Psychiatry

## 2013-03-26 VITALS — Ht 61.0 in | Wt 170.0 lb

## 2013-03-26 DIAGNOSIS — F339 Major depressive disorder, recurrent, unspecified: Secondary | ICD-10-CM

## 2013-03-26 DIAGNOSIS — F332 Major depressive disorder, recurrent severe without psychotic features: Secondary | ICD-10-CM

## 2013-03-26 MED ORDER — GABAPENTIN 300 MG PO CAPS: ORAL_CAPSULE | ORAL | Status: DC

## 2013-03-26 MED ORDER — CLONAZEPAM 1 MG PO TABS: 1.0000 mg | ORAL_TABLET | Freq: Two times a day (BID) | ORAL | Status: DC

## 2013-03-26 MED ORDER — BUPROPION HCL ER (XL) 150 MG PO TB24: 150.0000 mg | ORAL_TABLET | ORAL | Status: DC
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
## 2013-04-05 ENCOUNTER — Ambulatory Visit (INDEPENDENT_AMBULATORY_CARE_PROVIDER_SITE_OTHER): Payer: PRIVATE HEALTH INSURANCE | Admitting: Psychiatry

## 2013-04-05 DIAGNOSIS — F339 Major depressive disorder, recurrent, unspecified: Secondary | ICD-10-CM

## 2013-04-05 NOTE — Patient Instructions (Signed)
Discussed orally 
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
## 2013-04-19 ENCOUNTER — Ambulatory Visit (HOSPITAL_COMMUNITY): Payer: Self-pay | Admitting: Psychiatry

## 2013-04-20 ENCOUNTER — Other Ambulatory Visit: Payer: Self-pay | Admitting: *Deleted

## 2013-04-20 DIAGNOSIS — D381 Neoplasm of uncertain behavior of trachea, bronchus and lung: Secondary | ICD-10-CM

## 2013-04-22 ENCOUNTER — Ambulatory Visit (INDEPENDENT_AMBULATORY_CARE_PROVIDER_SITE_OTHER): Payer: PRIVATE HEALTH INSURANCE | Admitting: Psychiatry

## 2013-04-22 ENCOUNTER — Encounter (HOSPITAL_COMMUNITY): Payer: Self-pay | Admitting: Psychiatry

## 2013-04-22 VITALS — BP 130/80 | Ht 61.0 in | Wt 164.0 lb

## 2013-04-22 DIAGNOSIS — F332 Major depressive disorder, recurrent severe without psychotic features: Secondary | ICD-10-CM

## 2013-04-22 DIAGNOSIS — F339 Major depressive disorder, recurrent, unspecified: Secondary | ICD-10-CM

## 2013-04-22 MED ORDER — GABAPENTIN 300 MG PO CAPS: ORAL_CAPSULE | ORAL | Status: DC

## 2013-04-22 MED ORDER — BUPROPION HCL ER (XL) 300 MG PO TB24: 300.0000 mg | ORAL_TABLET | ORAL | Status: DC
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
## 2013-04-26 ENCOUNTER — Ambulatory Visit (INDEPENDENT_AMBULATORY_CARE_PROVIDER_SITE_OTHER): Payer: PRIVATE HEALTH INSURANCE | Admitting: Psychiatry

## 2013-04-26 DIAGNOSIS — F339 Major depressive disorder, recurrent, unspecified: Secondary | ICD-10-CM
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
## 2013-04-26 NOTE — Patient Instructions (Signed)
Discussed orally 

## 2013-05-05 ENCOUNTER — Ambulatory Visit: Payer: Self-pay | Admitting: Surgery

## 2013-05-05 ENCOUNTER — Other Ambulatory Visit: Payer: Self-pay

## 2013-05-10 ENCOUNTER — Ambulatory Visit
Admission: RE | Admit: 2013-05-10 | Discharge: 2013-05-10 | Disposition: A | Discharge status: Home / Self Care | Payer: PRIVATE HEALTH INSURANCE | Source: Ambulatory Visit | Attending: Surgery | Admitting: Surgery

## 2013-05-10 DIAGNOSIS — D381 Neoplasm of uncertain behavior of trachea, bronchus and lung: Secondary | ICD-10-CM

## 2013-05-12 ENCOUNTER — Encounter: Payer: Self-pay | Admitting: Surgery

## 2013-05-12 ENCOUNTER — Ambulatory Visit (INDEPENDENT_AMBULATORY_CARE_PROVIDER_SITE_OTHER): Payer: PRIVATE HEALTH INSURANCE | Admitting: Surgery

## 2013-05-12 VITALS — BP 124/82 | HR 75 | Resp 20 | Ht 61.0 in | Wt 164.0 lb

## 2013-05-12 DIAGNOSIS — J984 Other disorders of lung: Secondary | ICD-10-CM

## 2013-05-12 DIAGNOSIS — R911 Solitary pulmonary nodule: Secondary | ICD-10-CM

## 2013-05-12 NOTE — Progress Notes (Signed)
HPI:  She returns today for follow-up of a 6 mm right lower lobe nodule seen on CT scan of the chest in 09/2012. She says she has felt well with no cough or sputum production. She denies dyspnea and chest pain.  Current Outpatient Prescriptions  Medication Sig Dispense Refill  . amLODipine (NORVASC) 10 MG tablet Take 10 mg by mouth daily.      Marland Kitchen antiseptic oral rinse (BIOTENE) LIQD 15 mLs by Mouth Rinse route as needed (Oral hygiene).       Marland Kitchen aspirin 325 MG EC tablet Take 325 mg by mouth every morning.       Marland Kitchen buPROPion (WELLBUTRIN XL) 300 MG 24 hr tablet Take 1 tablet (300 mg total) by mouth every morning.  30 tablet  2  . cholecalciferol (VITAMIN D) 1000 UNITS tablet Take 1,000 Units by mouth every morning.       . Cinnamon 500 MG TABS Take 1,000 mg by mouth every morning.       . clonazePAM (KLONOPIN) 1 MG tablet Take 1 tablet (1 mg total) by mouth 2 (two) times daily.  60 tablet  2  . cycloSPORINE (RESTASIS) 0.05 % ophthalmic emulsion 1 drop 2 (two) times daily.      Marland Kitchen docusate sodium (COLACE) 50 MG capsule Take by mouth every evening.      . famotidine (PEPCID) 20 MG tablet Take 20 mg by mouth 2 (two) times daily.        . furosemide (LASIX) 20 MG tablet Take 1 tablet (20 mg total) by mouth daily.  30 tablet  12  . gabapentin (NEURONTIN) 300 MG capsule Take 3 tablets three times a day  270 capsule  2  . hydroxychloroquine (PLAQUENIL) 200 MG tablet Take 200-400 mg by mouth daily. Alternate days taking 1 pill, then 2 pills, then 1....      . metoprolol succinate (TOPROL-XL) 50 MG 24 hr tablet TAKE 1 TABLET BY MOUTH TWICE DAILY(TK EITHER WITH OR IMMEDIATELY FOLLOWING A MEAL)  60 tablet  0  . omeprazole (PRILOSEC) 20 MG capsule Take 20 mg by mouth every morning.       Vladimir Faster Glycol-Propyl Glycol (SYSTANE) 0.4-0.3 % GEL Apply 1 drop to eye at bedtime.      Marland Kitchen tiZANidine (ZANAFLEX) 4 MG tablet Take 4 mg by mouth at bedtime.       . traMADol (ULTRAM) 50 MG tablet Take 100 mg by mouth  2 (two) times daily.        No current facility-administered medications for this visit.     Physical Exam: BP 124/82  Pulse 75  Resp 20  Ht 5\' 1"  (1.549 m)  Wt 164 lb (74.39 kg)  BMI 31.00 kg/m2  SpO2 97% Constitutional: She is oriented to person, place, and time. She appears well-developed and well-nourished. No distress.  HENT:  Head: Normocephalic and atraumatic.  Mouth/Throat: Oropharynx is clear and moist.  Eyes: EOM are normal. Pupils are equal, round, and reactive to light.  Neck: Normal range of motion. Neck supple. No tracheal deviation present. No thyromegaly present.  Cardiovascular: Normal rate, regular rhythm, normal heart sounds and intact distal pulses.  No murmur heard.  Pulmonary/Chest: Effort normal and breath sounds normal. No respiratory distress. She has no rales.  Abdominal: Soft. Bowel sounds are normal. She exhibits no distension and no mass. There is no tenderness.     Diagnostic Tests:  CLINICAL DATA: Followup of right-sided pulmonary nodule. Prior  mitral valve  repair. Hypertension. Rheumatic heart disease.  EXAM:  CT CHEST WITHOUT CONTRAST  TECHNIQUE:  Multidetector CT imaging of the chest was performed following the  standard protocol without IV contrast.  COMPARISON: CT CHEST W/O CM dated 09/23/2012; CT ANGIO CHEST W/CM  &/OR WO/CM dated 12/01/2011; CT ABD/PELV WO CM dated 09/10/2012  FINDINGS:  Lungs/Pleura: 5 mm right lower lobe lung nodule is similar to 6 mm  on the prior exam. Image 34 today. Minimal perifissural thickening  on the left on image 26, felt to be similar. No pleural fluid.  Heart/Mediastinum: Mild cardiomegaly, accentuated by a mild pectus  excavatum deformity. Similar mild pericardial calcification, without  pericardial effusion. No mediastinal or definite hilar adenopathy,  given limitations of unenhanced CT. Prevascular nodes are less  impressive than on the prior exam, and not pathologic by size  criteria. Example 4 mm  node on image 19 which is decreased from a 6  mm on the prior.  Upper Abdomen: Tiny left hepatic lobes cyst. Cholecystectomy.  Colonic stool burden suggests constipation.  Bones/Musculoskeletal: Prior median sternotomy.  IMPRESSION:  1. Since 09/10/2012, similar right lower lobe lung nodule. If the  patient is at low risk for primary bronchogenic carcinoma, followup  in June of 2015 is recommended. If the patient is at high risk,  followup at December of 2015 is recommended. This recommendation  follows the consensus statement: Guidelines for Management of Small  Pulmonary Nodules Detected on CT Scans: A Statement from the  [place] as published in Radiology 2005; 237:395-400.  2. Similar pericardial calcification, suggesting prior pericardial  effusion.  Electronically Signed  By: Abigail Miyamoto M.D.  On: 05/10/2013 15:30    Impression:  She has a stable 5-6 mm RLL lung nodule. She is a nonsmoker and is at low risk for lung cancer.  Plan:  I will see her back in 6 months for repeat CT scan of the chest.

## 2013-05-17 ENCOUNTER — Ambulatory Visit (INDEPENDENT_AMBULATORY_CARE_PROVIDER_SITE_OTHER): Payer: PRIVATE HEALTH INSURANCE | Admitting: Psychiatry

## 2013-05-17 DIAGNOSIS — F339 Major depressive disorder, recurrent, unspecified: Secondary | ICD-10-CM

## 2013-05-17 NOTE — Patient Instructions (Signed)
Discussed orally 
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
## 2013-05-19 ENCOUNTER — Other Ambulatory Visit: Payer: Self-pay | Admitting: Cardiology

## 2013-05-20 ENCOUNTER — Ambulatory Visit (INDEPENDENT_AMBULATORY_CARE_PROVIDER_SITE_OTHER): Payer: PRIVATE HEALTH INSURANCE | Admitting: Psychiatry

## 2013-05-20 ENCOUNTER — Encounter (HOSPITAL_COMMUNITY): Payer: Self-pay | Admitting: Psychiatry

## 2013-05-20 VITALS — BP 120/80 | Ht 61.0 in | Wt 166.0 lb

## 2013-05-20 DIAGNOSIS — F339 Major depressive disorder, recurrent, unspecified: Secondary | ICD-10-CM

## 2013-05-20 DIAGNOSIS — F332 Major depressive disorder, recurrent severe without psychotic features: Secondary | ICD-10-CM

## 2013-05-20 MED ORDER — CLONAZEPAM 1 MG PO TABS: 1.0000 mg | ORAL_TABLET | Freq: Two times a day (BID) | ORAL | Status: DC
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
## 2013-06-14 ENCOUNTER — Ambulatory Visit (INDEPENDENT_AMBULATORY_CARE_PROVIDER_SITE_OTHER): Payer: PRIVATE HEALTH INSURANCE | Admitting: Psychiatry

## 2013-06-14 DIAGNOSIS — F339 Major depressive disorder, recurrent, unspecified: Secondary | ICD-10-CM

## 2013-06-14 NOTE — Progress Notes (Signed)
   THERAPIST PROGRESS NOTE  Session Time: Monday 06/14/2013 11:10 AM - 12:00 PM  Participation Level: Active  Behavioral Response: CasualAlertEuthymic  Type of Therapy: Individual Therapy  Treatment Goals addressed:  Increase social involvement (visiting friends, shopping, being with grandchildren, possibly going on vacation)      Decrease intensity and frequency of panic attacks      Resume normal interest in activities and improve mood   Interventions: Supportive  Summary: Alexandria Leach is a 63 y.o. female who presents wit a long-standing history of recurrent periods of depression along with symptoms of anxiety. She has multiple stressors including her husband who is an alcoholic and her health issues. Patient's initial symptoms included depressed mood, anxiety, sleep difficulty, loss of interest in activities, irritability, excessive worry, panic attacks, loss of libido, and poor concentration. Patient has experienced significant symptom improvement with the use of medication and coping skills. She continues to experience improved pain management with use of Neurontin as prescribed by Dr. Harrington Challenger. Patient is happy to be able to be more active. She is volunteering doing light office work, attending meetings for her faith, and having increased contact with friends and relatives. She also reports decreased panic attacks and says she had only one since last session.  It occurred at a restaurant when she was having lunch with sisters. However, patient was able to successfully manage with breathing techniques and coping statements.She is pleased she was able to continue with the lunch. Her husband continues to have issues but patient reports no longer allowing this to burden her and being able to go on with her life.   Suicidal/Homicidal: No  Therapist Response: Therapist works with patient to reinforce involvement in activity, use of coping skills, and use of support system. Therapist and patient  reviewed treatment plan and agree to terminate psychotherapy services at this time as patient has accomplished her goals. Patient is encouraged to contact this practice should she need psychotherapy services in the future  Plan: Psychotherapy services are discontinued at this time as patient has accomplished goals. She will continue to see psychiatrist Dr. Harrington Challenger for medication management  Diagnosis: Axis I: Major depressive disorder, recurrent    Axis II: Deferred    Kadajah Kjos, LCSW 06/14/2013    Outpatient Therapist Discharge Summary  ALAISHA EVERSLEY    07-27-1950   Admission Date: 03/01/2013 Discharge Date:  06/14/2013 Reason for Discharge: Treatment Completed Diagnosis:  Axis I:  Major depressive disorder, recurrent    Comments:   Maurice Small LCSW

## 2013-06-14 NOTE — Patient Instructions (Signed)
Discussed orally 

## 2013-06-17 IMAGING — CR DG ABDOMEN 2V
2 series · 2 of 2 positions shown · non-contrast
Comparison: 10/01/2011

CLINICAL DATA: Constipation

ABDOMEN - 2 VIEW

[w abdomen upright]
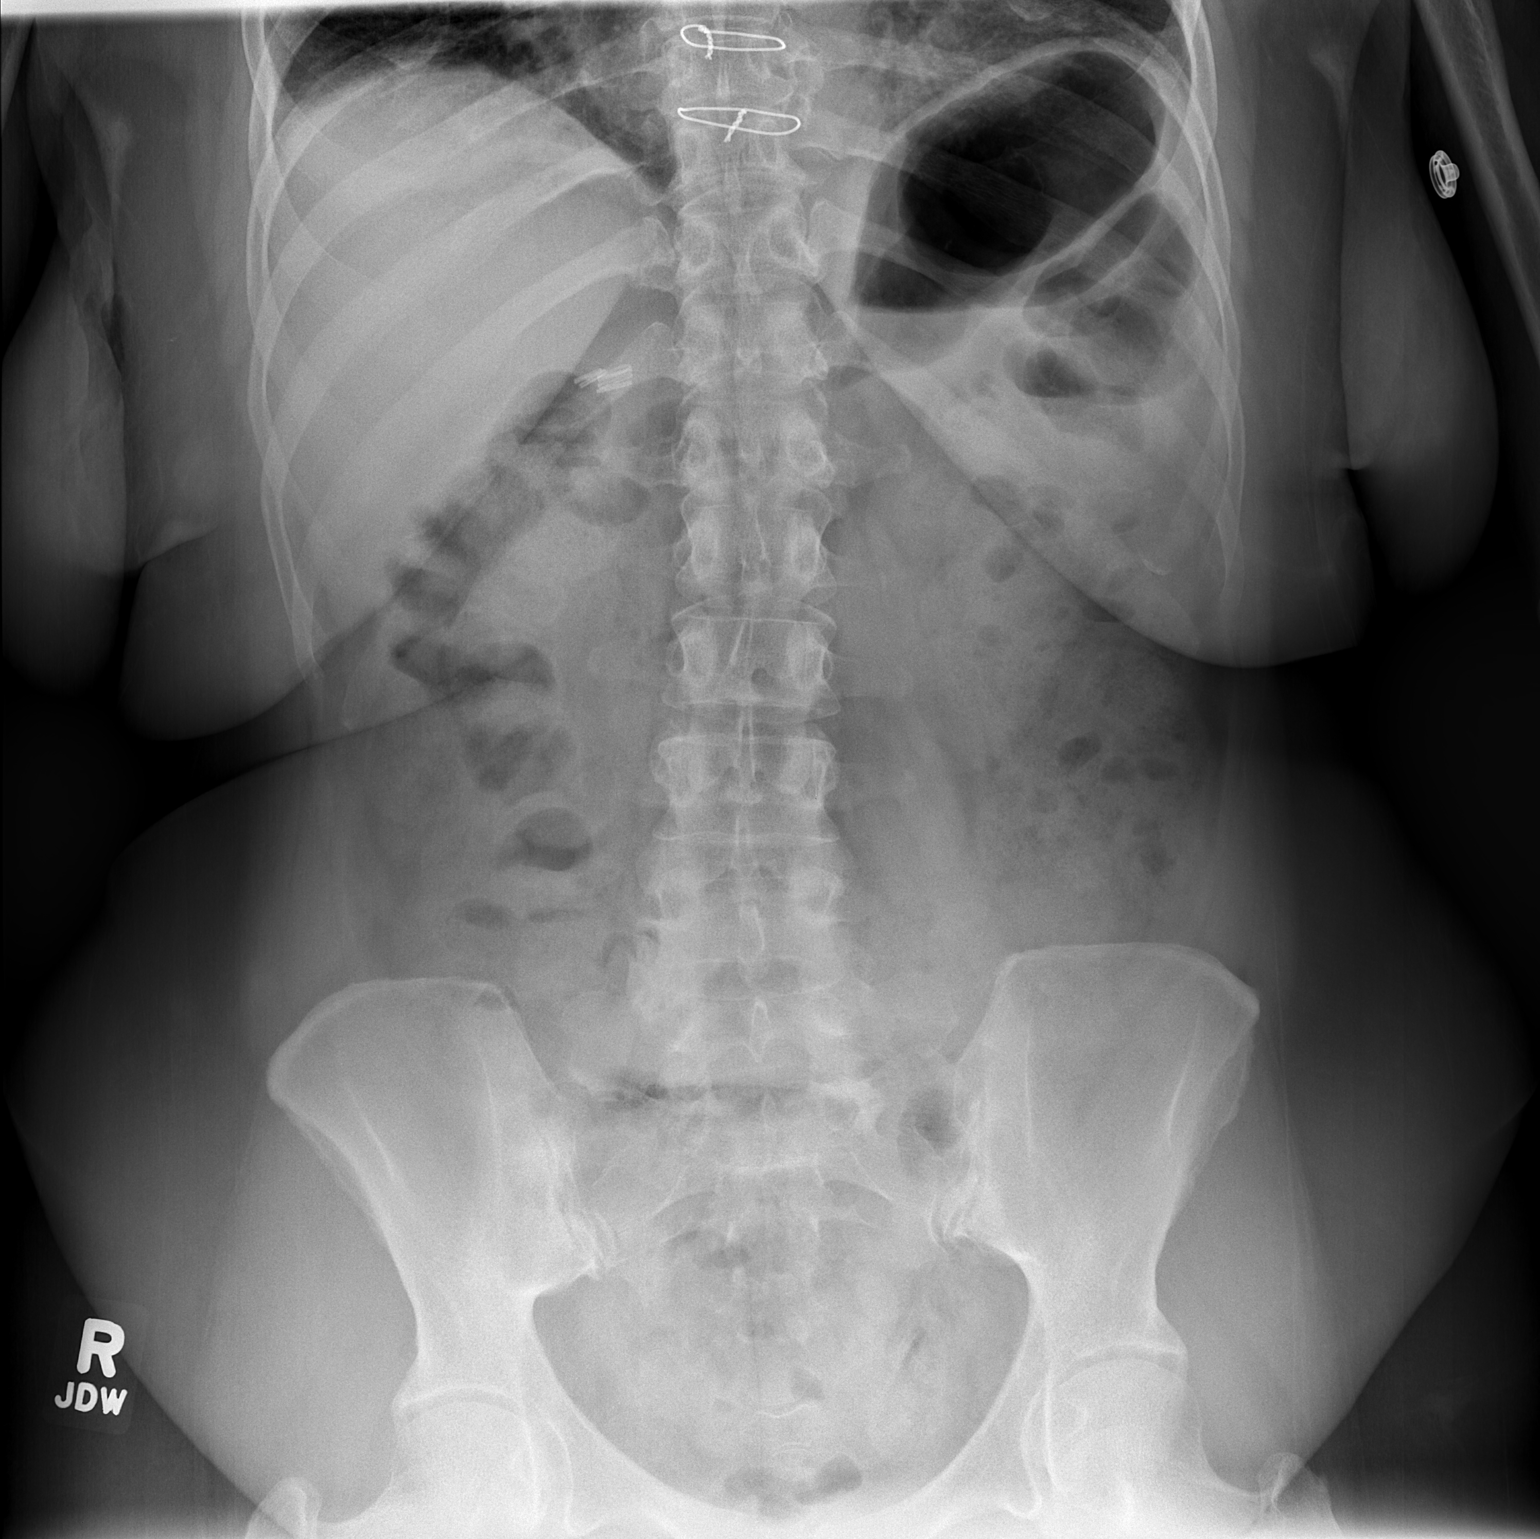

[t abdomen supine]
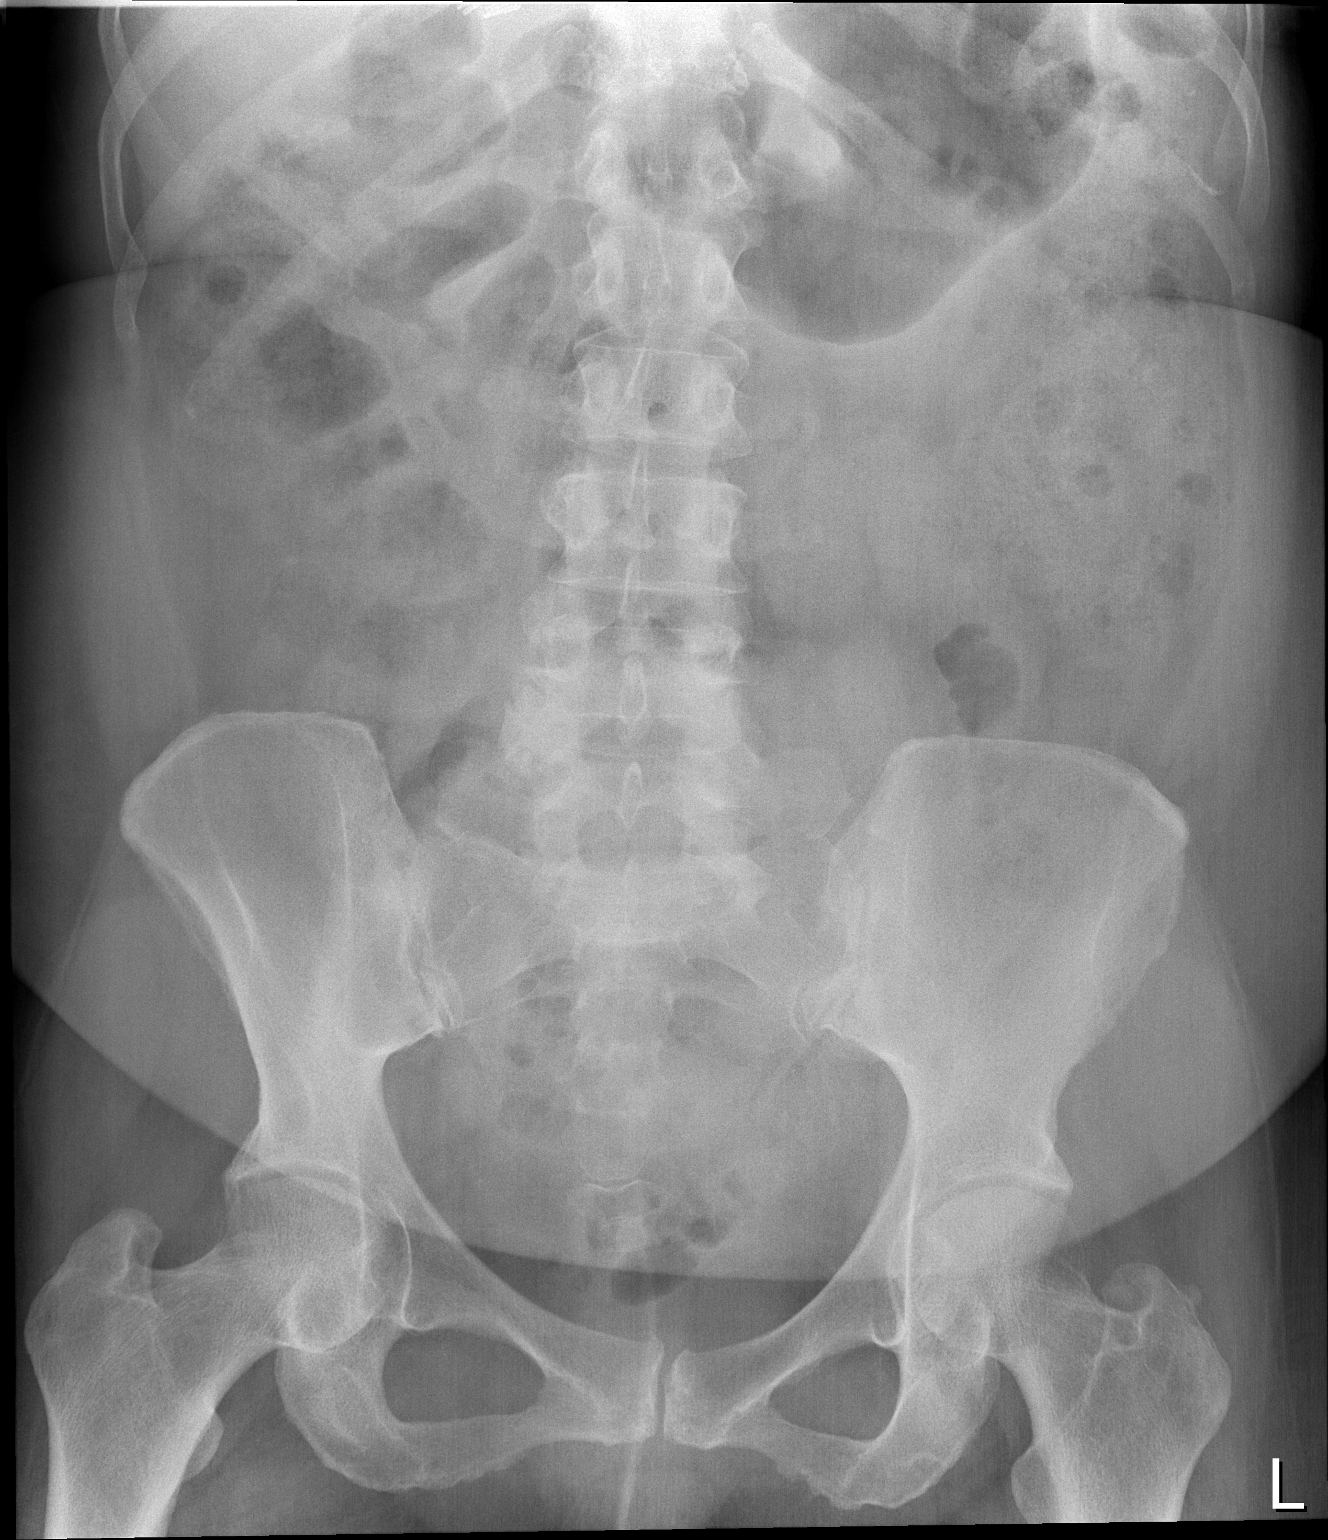

[2 of 2 positions shown; findings below may reference images not displayed]

FINDINGS: Partially imaged sternotomy wires.  See contemporaneous
chest report regarding the lung base process sees.  Surgical clips
right upper quadrant.  No free intraperitoneal air. The bowel gas
pattern is non-obstructive. Organ outlines are normal where seen.
No acute or aggressive osseous abnormality identified.
IMPRESSION: Nonobstructive bowel gas pattern.

## 2013-06-17 IMAGING — CR DG CHEST 2V
2 series · 2 of 2 positions shown · non-contrast
Comparison: 12/01/2011

CLINICAL DATA: Pneumonia

CHEST - 2 VIEW

[w chest pa]
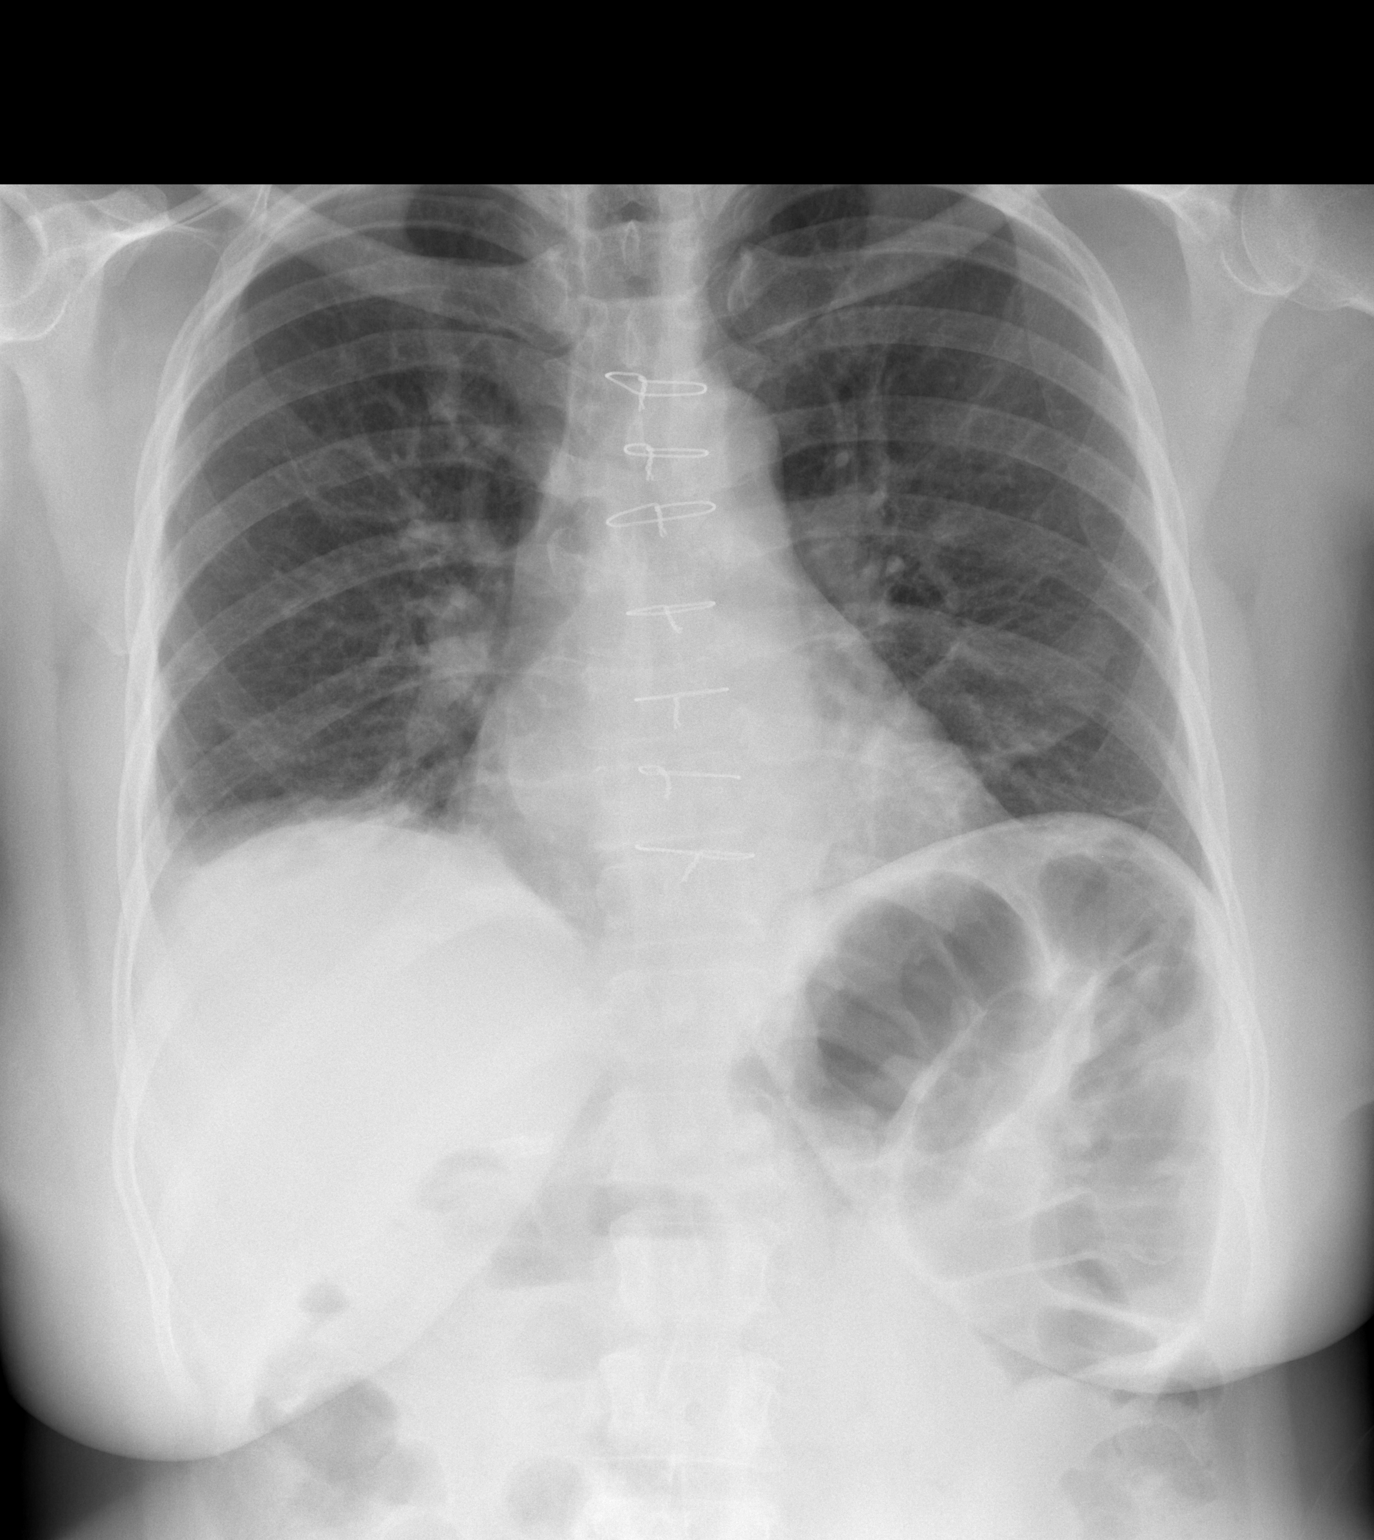

[w chest lat]
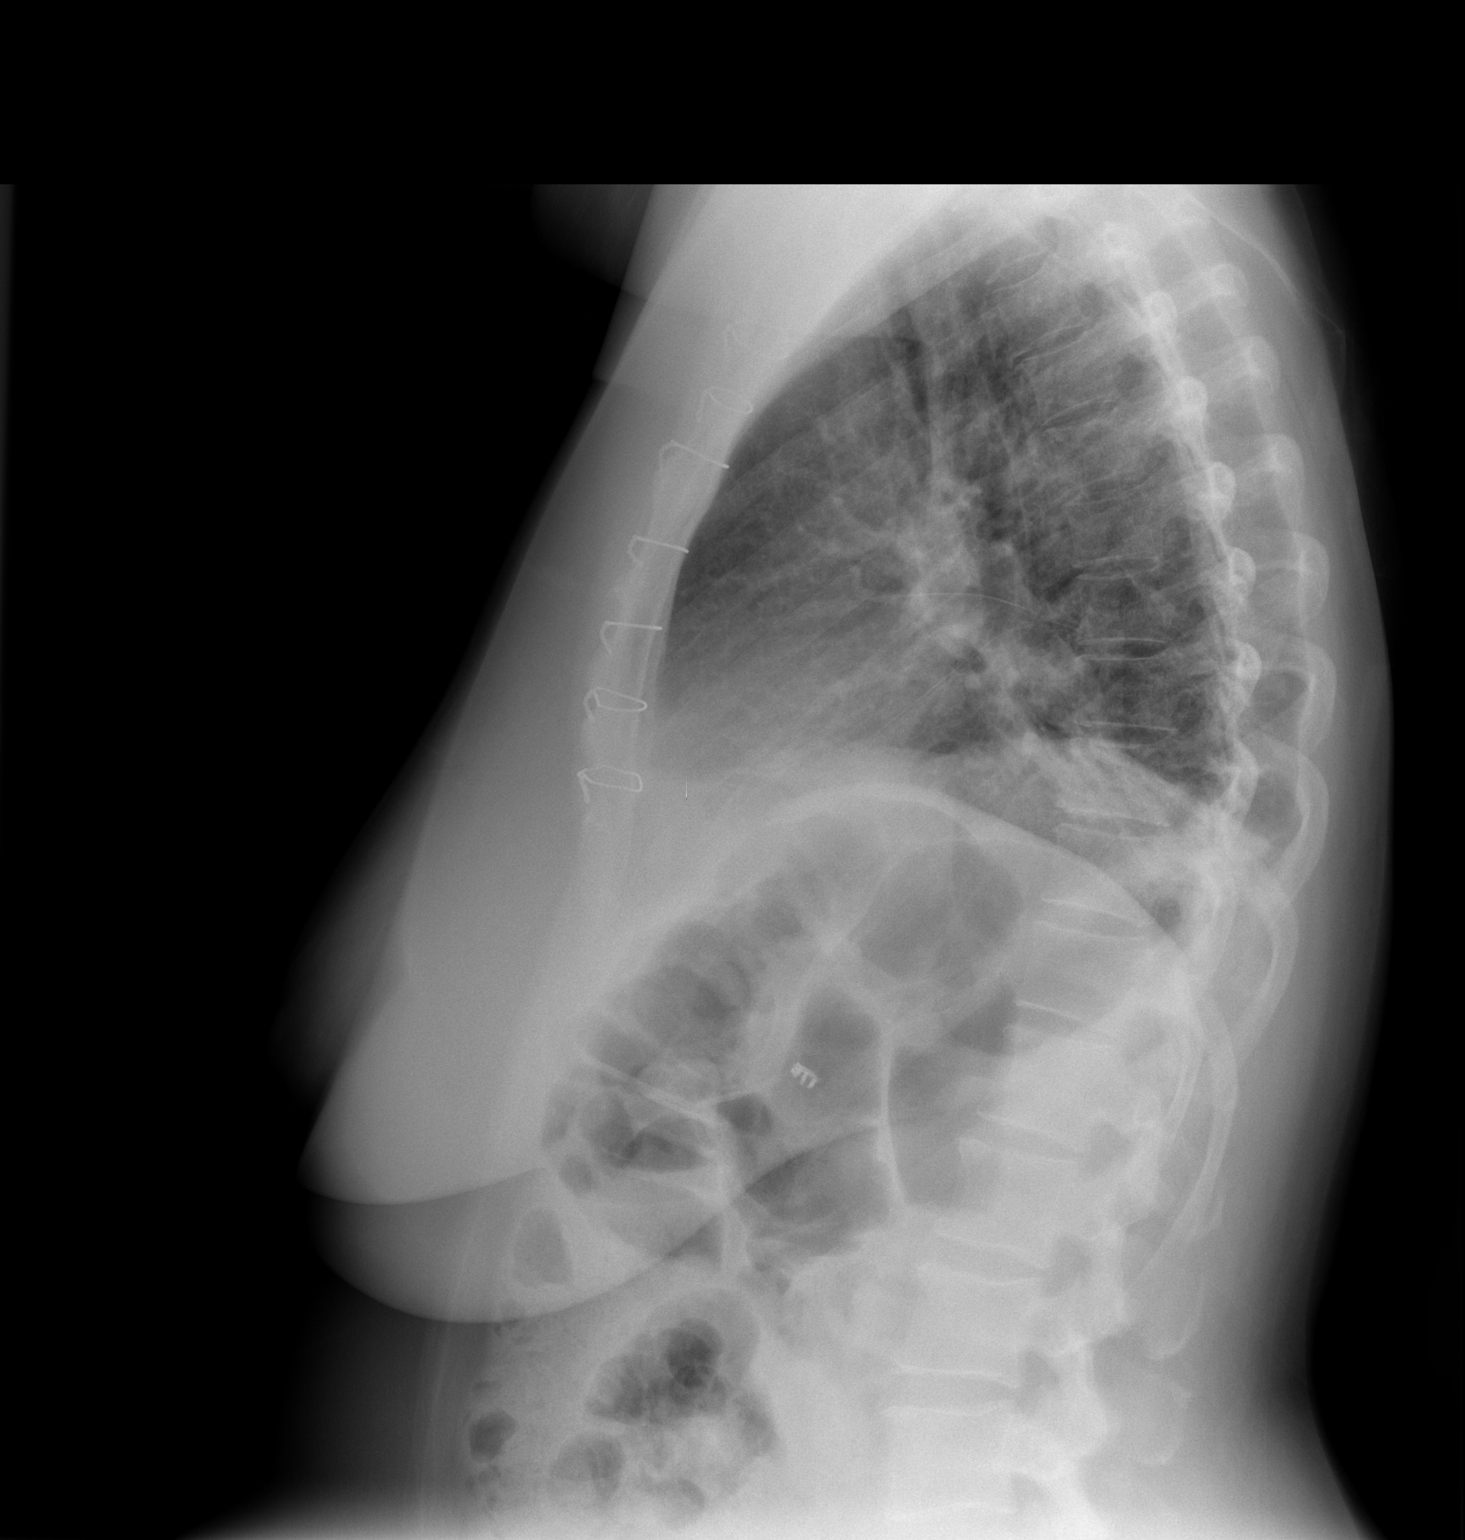

[2 of 2 positions shown; findings below may reference images not displayed]

FINDINGS: Right basilar airspace disease has improved.  Right
pleural effusion has improved.  Minimal linear atelectasis at the
left base.  Upper normal heart size.  Postoperative changes.
Pneumothorax.
IMPRESSION: Improved right lower lobe pneumonia and right pleural effusion.
There is persistent disease.

## 2013-06-25 ENCOUNTER — Other Ambulatory Visit (HOSPITAL_COMMUNITY): Payer: Self-pay | Admitting: Psychiatry

## 2013-06-30 IMAGING — CR DG CHEST 2V
2 series · 2 of 2 positions shown · non-contrast
Comparison: Chest x-ray of 12/03/2011

CLINICAL DATA: History of pneumonia, follow-up

CHEST - 2 VIEW

[w chest pa]
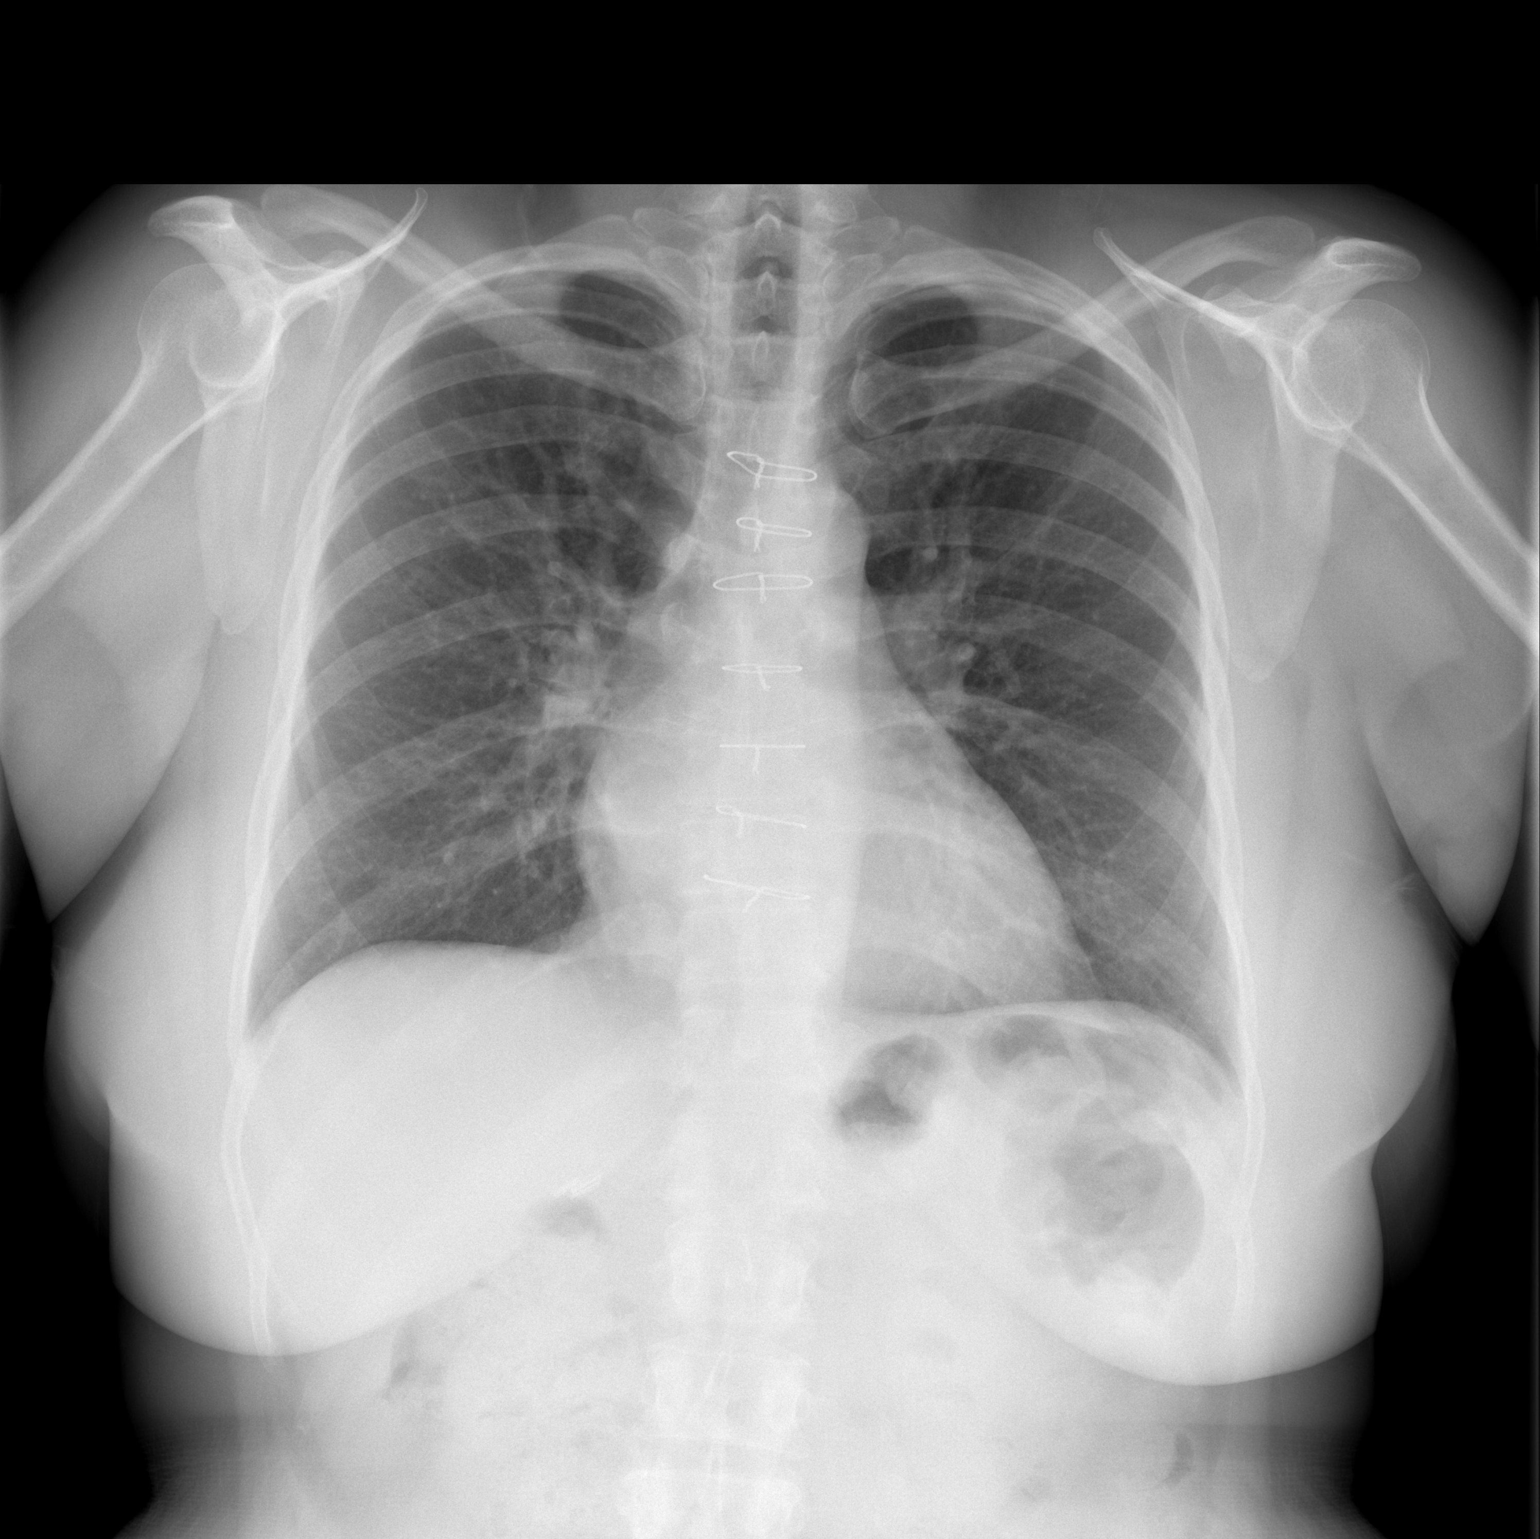

[w chest lat]
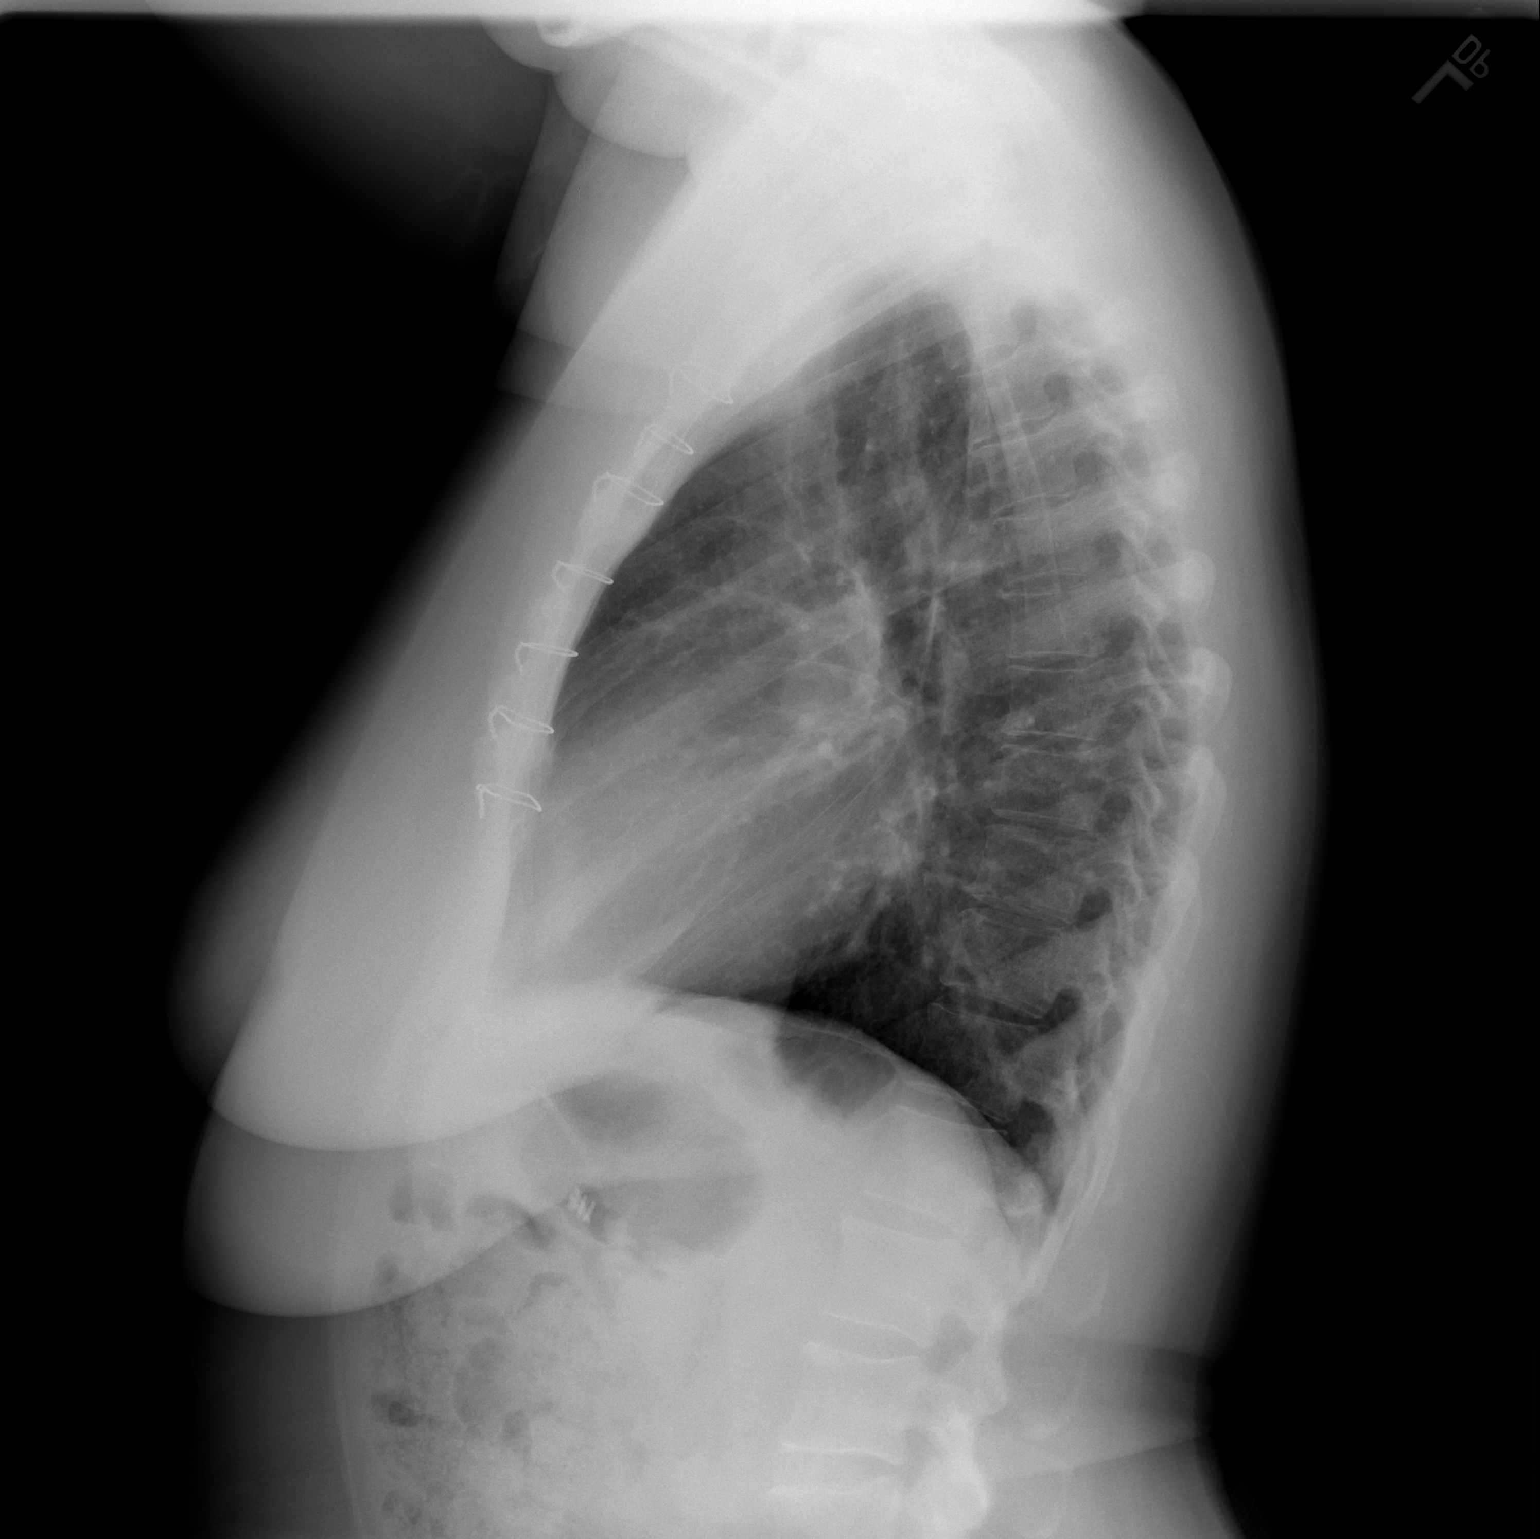

[2 of 2 positions shown; findings below may reference images not displayed]

FINDINGS: The opacity noted previously particularly in the right
lung base has cleared.  No infiltrate or effusion is seen.  Mild
peribronchial thickening is noted.  The heart is mildly enlarged
and stable.  Median sternotomy sutures are present.  No bony
abnormality is seen.
IMPRESSION: Clearing of right basilar opacity.  No active process.  Mild
peribronchial thickening.

## 2013-07-08 ENCOUNTER — Other Ambulatory Visit: Payer: Self-pay | Admitting: Cardiology

## 2013-07-17 ENCOUNTER — Other Ambulatory Visit (HOSPITAL_COMMUNITY): Payer: Self-pay | Admitting: Psychiatry

## 2013-07-20 ENCOUNTER — Ambulatory Visit (INDEPENDENT_AMBULATORY_CARE_PROVIDER_SITE_OTHER): Payer: PRIVATE HEALTH INSURANCE | Admitting: Psychiatry

## 2013-07-20 ENCOUNTER — Telehealth (HOSPITAL_COMMUNITY): Payer: Self-pay | Admitting: *Deleted

## 2013-07-20 ENCOUNTER — Encounter (HOSPITAL_COMMUNITY): Payer: Self-pay | Admitting: Psychiatry

## 2013-07-20 VITALS — BP 120/78 | Ht 61.0 in | Wt 169.0 lb

## 2013-07-20 DIAGNOSIS — F339 Major depressive disorder, recurrent, unspecified: Secondary | ICD-10-CM

## 2013-07-20 DIAGNOSIS — F332 Major depressive disorder, recurrent severe without psychotic features: Secondary | ICD-10-CM

## 2013-07-20 MED ORDER — CLONAZEPAM 1 MG PO TABS: 1.0000 mg | ORAL_TABLET | Freq: Two times a day (BID) | ORAL | Status: DC

## 2013-07-20 MED ORDER — BUPROPION HCL ER (XL) 300 MG PO TB24: 300.0000 mg | ORAL_TABLET | ORAL | Status: DC

## 2013-07-20 MED ORDER — GABAPENTIN 300 MG PO CAPS: ORAL_CAPSULE | ORAL | Status: DC

## 2013-07-20 NOTE — Telephone Encounter (Signed)
done
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
## 2013-07-22 ENCOUNTER — Other Ambulatory Visit (HOSPITAL_COMMUNITY): Payer: Self-pay | Admitting: Internal Medicine

## 2013-07-22 DIAGNOSIS — Z1231 Encounter for screening mammogram for malignant neoplasm of breast: Secondary | ICD-10-CM

## 2013-07-23 ENCOUNTER — Ambulatory Visit (HOSPITAL_COMMUNITY)
Admission: RE | Admit: 2013-07-23 | Discharge: 2013-07-23 | Disposition: A | Discharge status: Home / Self Care | Payer: PRIVATE HEALTH INSURANCE | Source: Ambulatory Visit | Attending: Internal Medicine | Admitting: Internal Medicine

## 2013-07-23 DIAGNOSIS — Z1231 Encounter for screening mammogram for malignant neoplasm of breast: Secondary | ICD-10-CM | POA: Insufficient documentation

## 2013-09-06 ENCOUNTER — Other Ambulatory Visit (HOSPITAL_COMMUNITY): Payer: Self-pay | Admitting: Psychiatry

## 2013-09-07 ENCOUNTER — Other Ambulatory Visit (HOSPITAL_COMMUNITY): Payer: Self-pay | Admitting: Psychiatry

## 2013-09-08 ENCOUNTER — Other Ambulatory Visit (HOSPITAL_COMMUNITY): Payer: Self-pay | Admitting: Psychiatry

## 2013-09-08 ENCOUNTER — Telehealth (HOSPITAL_COMMUNITY): Payer: Self-pay | Admitting: *Deleted

## 2013-09-08 MED ORDER — GABAPENTIN 300 MG PO CAPS: ORAL_CAPSULE | ORAL | Status: DC

## 2013-09-08 NOTE — Telephone Encounter (Signed)
done

## 2013-09-09 ENCOUNTER — Telehealth (HOSPITAL_COMMUNITY): Payer: Self-pay | Admitting: *Deleted

## 2013-09-09 ENCOUNTER — Other Ambulatory Visit (HOSPITAL_COMMUNITY): Payer: Self-pay | Admitting: Psychiatry

## 2013-09-09 MED ORDER — BUPROPION HCL ER (XL) 300 MG PO TB24: 300.0000 mg | ORAL_TABLET | ORAL | Status: DC

## 2013-09-09 NOTE — Telephone Encounter (Signed)
done

## 2013-09-20 ENCOUNTER — Telehealth: Payer: Self-pay | Admitting: Cardiology

## 2013-09-20 MED ORDER — AMOXICILLIN 500 MG PO CAPS: ORAL_CAPSULE | ORAL | Status: DC

## 2013-09-20 NOTE — Telephone Encounter (Signed)
She is going to dentist on 7/13 on needs antibiotic refill  Says Dr Stanford Breed normally does this for her.  Pharmacy is [facility] on [address] of [address].  Please call

## 2013-09-20 NOTE — Telephone Encounter (Signed)
Spoke with pt, aware script has been sent to the pharm

## 2013-10-04 ENCOUNTER — Other Ambulatory Visit: Payer: Self-pay

## 2013-10-04 DIAGNOSIS — D381 Neoplasm of uncertain behavior of trachea, bronchus and lung: Secondary | ICD-10-CM

## 2013-10-15 ENCOUNTER — Other Ambulatory Visit: Payer: Self-pay | Admitting: *Deleted

## 2013-10-15 DIAGNOSIS — I359 Nonrheumatic aortic valve disorder, unspecified: Secondary | ICD-10-CM

## 2013-10-20 ENCOUNTER — Encounter (HOSPITAL_COMMUNITY): Payer: Self-pay | Admitting: Psychiatry

## 2013-10-20 ENCOUNTER — Ambulatory Visit (INDEPENDENT_AMBULATORY_CARE_PROVIDER_SITE_OTHER): Payer: PRIVATE HEALTH INSURANCE | Admitting: Psychiatry

## 2013-10-20 VITALS — BP 112/60 | Ht 61.0 in | Wt 169.0 lb

## 2013-10-20 DIAGNOSIS — F331 Major depressive disorder, recurrent, moderate: Secondary | ICD-10-CM

## 2013-10-20 MED ORDER — CLONAZEPAM 1 MG PO TABS: 1.0000 mg | ORAL_TABLET | Freq: Two times a day (BID) | ORAL | Status: DC

## 2013-10-20 MED ORDER — GABAPENTIN 300 MG PO CAPS: ORAL_CAPSULE | ORAL | Status: DC

## 2013-10-20 MED ORDER — BUPROPION HCL ER (XL) 300 MG PO TB24: 300.0000 mg | ORAL_TABLET | ORAL | Status: DC
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
## 2013-10-25 ENCOUNTER — Ambulatory Visit (HOSPITAL_COMMUNITY): Payer: PRIVATE HEALTH INSURANCE | Attending: Cardiology | Admitting: Cardiology

## 2013-10-25 DIAGNOSIS — I059 Rheumatic mitral valve disease, unspecified: Secondary | ICD-10-CM | POA: Insufficient documentation

## 2013-10-25 DIAGNOSIS — I359 Nonrheumatic aortic valve disorder, unspecified: Secondary | ICD-10-CM

## 2013-10-25 NOTE — Progress Notes (Signed)
Echo performed. 

## 2013-11-08 ENCOUNTER — Other Ambulatory Visit (HOSPITAL_COMMUNITY): Payer: Self-pay | Admitting: Psychiatry

## 2013-11-15 ENCOUNTER — Ambulatory Visit
Admission: RE | Admit: 2013-11-15 | Discharge: 2013-11-15 | Disposition: A | Discharge status: Home / Self Care | Payer: PRIVATE HEALTH INSURANCE | Source: Ambulatory Visit | Attending: Surgery | Admitting: Surgery

## 2013-11-15 DIAGNOSIS — D381 Neoplasm of uncertain behavior of trachea, bronchus and lung: Secondary | ICD-10-CM

## 2013-11-17 ENCOUNTER — Encounter: Payer: Self-pay | Admitting: Surgery

## 2013-11-17 ENCOUNTER — Other Ambulatory Visit: Payer: Self-pay

## 2013-11-17 ENCOUNTER — Ambulatory Visit (INDEPENDENT_AMBULATORY_CARE_PROVIDER_SITE_OTHER): Payer: PRIVATE HEALTH INSURANCE | Admitting: Surgery

## 2013-11-17 VITALS — BP 119/87 | HR 71 | Resp 20 | Ht 61.0 in | Wt 169.0 lb

## 2013-11-17 DIAGNOSIS — R911 Solitary pulmonary nodule: Secondary | ICD-10-CM

## 2013-11-17 DIAGNOSIS — J984 Other disorders of lung: Secondary | ICD-10-CM

## 2013-11-17 NOTE — Progress Notes (Signed)
HPI:  She returns today for follow-up of a 6 mm right lower lobe nodule seen on CT scan of the chest in 09/2012. It was unchanged on her CT of the chest on 05/10/2013.  She says she has felt well with no cough or sputum production. She denies dyspnea and chest pain.   Current Outpatient Prescriptions  Medication Sig Dispense Refill  . amLODipine (NORVASC) 10 MG tablet Take 10 mg by mouth daily.      Marland Kitchen amoxicillin (AMOXIL) 500 MG capsule Take 4 tablets one hour prior to procedure.  4 capsule  6  . antiseptic oral rinse (BIOTENE) LIQD 15 mLs by Mouth Rinse route as needed (Oral hygiene).       Marland Kitchen aspirin 325 MG EC tablet Take 325 mg by mouth every morning.       Marland Kitchen buPROPion (WELLBUTRIN XL) 300 MG 24 hr tablet Take 1 tablet (300 mg total) by mouth every morning.  30 tablet  2  . cholecalciferol (VITAMIN D) 1000 UNITS tablet Take 1,000 Units by mouth every morning.       . Cinnamon 500 MG TABS Take 1,000 mg by mouth every morning.       . clonazePAM (KLONOPIN) 1 MG tablet Take 1 tablet (1 mg total) by mouth 2 (two) times daily.  60 tablet  2  . cycloSPORINE (RESTASIS) 0.05 % ophthalmic emulsion 1 drop 2 (two) times daily.      Marland Kitchen docusate sodium (COLACE) 50 MG capsule Take by mouth every evening.      . famotidine (PEPCID) 20 MG tablet Take 20 mg by mouth 2 (two) times daily.        . furosemide (LASIX) 20 MG tablet TAKE 1 TABLET BY MOUTH DAILY  30 tablet  3  . gabapentin (NEURONTIN) 300 MG capsule Take 3 tablets three times a day  270 capsule  2  . metoprolol succinate (TOPROL-XL) 50 MG 24 hr tablet TAKE 1 TABLET BY MOUTH TWICE DAILY  60 tablet  6  . omeprazole (PRILOSEC) 20 MG capsule Take 20 mg by mouth every morning.       Vladimir Faster Glycol-Propyl Glycol (SYSTANE) 0.4-0.3 % GEL Apply 1 drop to eye at bedtime.      Marland Kitchen tiZANidine (ZANAFLEX) 4 MG tablet Take 4 mg by mouth at bedtime.       . traMADol (ULTRAM) 50 MG tablet Take 100 mg by mouth 2 (two) times daily.        No current  facility-administered medications for this visit.     Physical Exam: BP 119/87  Pulse 71  Resp 20  Ht 5\' 1"  (1.549 m)  Wt 169 lb (76.658 kg)  BMI 31.95 kg/m2  SpO2 97% Constitutional: She is oriented to person, place, and time. She appears well-developed and well-nourished. No distress.  Head: Normocephalic and atraumatic.  Mouth/Throat: Oropharynx is clear and moist.  Eyes: EOM are normal. Pupils are equal, round, and reactive to light.  Neck: Normal range of motion. Neck supple. No tracheal deviation present. No thyromegaly present. There is no cervical or supraclavicular adenopathy. Cardiovascular: Normal rate, regular rhythm, normal heart sounds and intact distal pulses.  No murmur heard.  Pulmonary/Chest: Effort normal and breath sounds normal. No respiratory distress. She has no rales.  Abdominal: Soft. Bowel sounds are normal. She exhibits no distension and no mass. There is no tenderness.      Diagnostic Tests:  CLINICAL DATA: Followup lung lesion.  EXAM:  CT  CHEST WITHOUT CONTRAST  TECHNIQUE:  Multidetector CT imaging of the chest was performed following the  standard protocol without IV contrast.  COMPARISON: 05/10/2013  FINDINGS:  THORACIC INLET/BODY WALL:  9 mm low-density nodule in the lower left thyroid gland, grossly  stable from 2013. No neighboring adenopathy.  MEDIASTINUM:  Stable left atrial enlargement in this patient with history of  rheumatic heart disease and mitral valve repair. No pericardial  effusion. High-density the left ventricular apex, right atrium, and  ascending aorta are stable postoperative changes. No  lymphadenopathy.  LUNG WINDOWS:  An ill-defined pulmonary nodule in the right lower lobe, image 34,  is size stable from 05/10/2013. Aside from abdominal CT from June  2015, this nodule was not seen on more remote imaging, possibly due  to lower lobe atelectasis on December 01, 2011 CT chest. The  margins appear more ill-defined  compared to previous on axial  imaging, but the margins and bilobed shape is similar when assessed  on reformats. No new pulmonary nodule. No consolidation, edema,  effusion, or pneumothorax.  UPPER ABDOMEN:  No acute findings.  OSSEOUS:  No acute fracture. No suspicious lytic or blastic lesions.  IMPRESSION:  5 mm right lower lobe pulmonary nodule is size stable over 6 months.  Recommend followup chest CT in 1 year to confirm stability.  Electronically Signed  By: Jorje Guild M.D.  On: 11/15/2013 13:33   Impression:  She has a stable 5-6 mm RLL lung nodule. She is a nonsmoker and is at low risk for lung cancer.   Plan:   I will see her back in 1 year for repeat CT scan of the chest.

## 2013-11-25 ENCOUNTER — Other Ambulatory Visit (HOSPITAL_COMMUNITY): Payer: Self-pay | Admitting: Psychiatry

## 2013-12-07 ENCOUNTER — Other Ambulatory Visit: Payer: Self-pay | Admitting: Cardiology

## 2013-12-31 ENCOUNTER — Other Ambulatory Visit: Payer: Self-pay

## 2014-01-11 ENCOUNTER — Other Ambulatory Visit: Payer: Self-pay | Admitting: Nephrology

## 2014-01-11 DIAGNOSIS — N289 Disorder of kidney and ureter, unspecified: Secondary | ICD-10-CM

## 2014-01-14 ENCOUNTER — Ambulatory Visit
Admission: RE | Admit: 2014-01-14 | Discharge: 2014-01-14 | Disposition: A | Discharge status: Home / Self Care | Payer: PRIVATE HEALTH INSURANCE | Source: Ambulatory Visit | Attending: Nephrology | Admitting: Nephrology

## 2014-01-14 DIAGNOSIS — N289 Disorder of kidney and ureter, unspecified: Secondary | ICD-10-CM

## 2014-01-20 ENCOUNTER — Ambulatory Visit (INDEPENDENT_AMBULATORY_CARE_PROVIDER_SITE_OTHER): Payer: PRIVATE HEALTH INSURANCE | Admitting: Psychiatry

## 2014-01-20 ENCOUNTER — Encounter (HOSPITAL_COMMUNITY): Payer: Self-pay | Admitting: Psychiatry

## 2014-01-20 VITALS — BP 115/70 | HR 75 | Ht 61.0 in | Wt 162.4 lb

## 2014-01-20 DIAGNOSIS — F331 Major depressive disorder, recurrent, moderate: Secondary | ICD-10-CM

## 2014-01-20 MED ORDER — BUPROPION HCL ER (XL) 300 MG PO TB24: 300.0000 mg | ORAL_TABLET | ORAL | Status: DC

## 2014-01-20 MED ORDER — GABAPENTIN 300 MG PO CAPS: ORAL_CAPSULE | ORAL | Status: DC

## 2014-01-20 MED ORDER — CLONAZEPAM 1 MG PO TABS: 1.0000 mg | ORAL_TABLET | Freq: Three times a day (TID) | ORAL | Status: DC | PRN
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
## 2014-02-08 ENCOUNTER — Encounter: Payer: Self-pay | Admitting: *Deleted

## 2014-04-12 ENCOUNTER — Other Ambulatory Visit: Payer: Self-pay | Admitting: Cardiology

## 2014-04-22 ENCOUNTER — Ambulatory Visit (INDEPENDENT_AMBULATORY_CARE_PROVIDER_SITE_OTHER): Payer: 59 | Admitting: Psychiatry

## 2014-04-22 ENCOUNTER — Encounter (HOSPITAL_COMMUNITY): Payer: Self-pay | Admitting: Psychiatry

## 2014-04-22 VITALS — BP 107/75 | HR 68 | Ht 61.0 in | Wt 154.0 lb

## 2014-04-22 DIAGNOSIS — F332 Major depressive disorder, recurrent severe without psychotic features: Secondary | ICD-10-CM

## 2014-04-22 DIAGNOSIS — F331 Major depressive disorder, recurrent, moderate: Secondary | ICD-10-CM

## 2014-04-22 MED ORDER — ZOLPIDEM TARTRATE ER 12.5 MG PO TBCR: 12.5000 mg | EXTENDED_RELEASE_TABLET | Freq: Every evening | ORAL | Status: DC | PRN

## 2014-04-22 MED ORDER — GABAPENTIN 300 MG PO CAPS: ORAL_CAPSULE | ORAL | Status: DC

## 2014-04-22 MED ORDER — CLONAZEPAM 1 MG PO TABS: 1.0000 mg | ORAL_TABLET | Freq: Three times a day (TID) | ORAL | Status: DC | PRN

## 2014-04-22 MED ORDER — BUPROPION HCL ER (XL) 300 MG PO TB24: 300.0000 mg | ORAL_TABLET | ORAL | Status: DC
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
## 2014-04-23 ENCOUNTER — Other Ambulatory Visit (HOSPITAL_COMMUNITY): Payer: Self-pay | Admitting: Psychiatry

## 2014-04-27 DIAGNOSIS — Z036 Encounter for observation for suspected toxic effect from ingested substance ruled out: Secondary | ICD-10-CM | POA: Diagnosis not present

## 2014-04-28 DIAGNOSIS — Z036 Encounter for observation for suspected toxic effect from ingested substance ruled out: Secondary | ICD-10-CM | POA: Diagnosis not present

## 2014-05-01 ENCOUNTER — Other Ambulatory Visit (HOSPITAL_COMMUNITY): Payer: Self-pay | Admitting: Psychiatry

## 2014-05-26 DIAGNOSIS — R944 Abnormal results of kidney function studies: Secondary | ICD-10-CM | POA: Diagnosis not present

## 2014-05-30 ENCOUNTER — Telehealth (HOSPITAL_COMMUNITY): Payer: Self-pay | Admitting: *Deleted

## 2014-05-30 DIAGNOSIS — R739 Hyperglycemia, unspecified: Secondary | ICD-10-CM | POA: Diagnosis not present

## 2014-05-30 DIAGNOSIS — I10 Essential (primary) hypertension: Secondary | ICD-10-CM | POA: Diagnosis not present

## 2014-05-30 NOTE — Telephone Encounter (Signed)
Prior authorization received for Zolpidem ER 12.5mg . Called to [phone], it will be reviewed and decision faxed.

## 2014-06-02 DIAGNOSIS — R509 Fever, unspecified: Secondary | ICD-10-CM | POA: Diagnosis not present

## 2014-06-03 ENCOUNTER — Emergency Department (HOSPITAL_COMMUNITY): Payer: Medicare Other

## 2014-06-03 ENCOUNTER — Encounter (HOSPITAL_COMMUNITY): Payer: Self-pay | Admitting: *Deleted

## 2014-06-03 ENCOUNTER — Emergency Department (HOSPITAL_COMMUNITY)
Admission: EM | Admit: 2014-06-03 | Discharge: 2014-06-03 | Disposition: A | Discharge status: Home / Self Care | Payer: Medicare Other | Attending: Emergency Medicine | Admitting: Emergency Medicine

## 2014-06-03 ENCOUNTER — Emergency Department (HOSPITAL_COMMUNITY)
Admission: EM | Admit: 2014-06-03 | Discharge: 2014-06-03 | Disposition: A | Discharge status: Other Acute Inpatient Hospital | Payer: Medicare Other | Source: Home / Self Care | Attending: Emergency Medicine | Admitting: Emergency Medicine

## 2014-06-03 DIAGNOSIS — Z79899 Other long term (current) drug therapy: Secondary | ICD-10-CM | POA: Diagnosis not present

## 2014-06-03 DIAGNOSIS — E780 Pure hypercholesterolemia: Secondary | ICD-10-CM | POA: Insufficient documentation

## 2014-06-03 DIAGNOSIS — I509 Heart failure, unspecified: Secondary | ICD-10-CM | POA: Insufficient documentation

## 2014-06-03 DIAGNOSIS — M1990 Unspecified osteoarthritis, unspecified site: Secondary | ICD-10-CM | POA: Diagnosis not present

## 2014-06-03 DIAGNOSIS — R0789 Other chest pain: Secondary | ICD-10-CM

## 2014-06-03 DIAGNOSIS — Z8669 Personal history of other diseases of the nervous system and sense organs: Secondary | ICD-10-CM | POA: Insufficient documentation

## 2014-06-03 DIAGNOSIS — I10 Essential (primary) hypertension: Secondary | ICD-10-CM | POA: Insufficient documentation

## 2014-06-03 DIAGNOSIS — Z87442 Personal history of urinary calculi: Secondary | ICD-10-CM | POA: Insufficient documentation

## 2014-06-03 DIAGNOSIS — R509 Fever, unspecified: Secondary | ICD-10-CM

## 2014-06-03 DIAGNOSIS — F329 Major depressive disorder, single episode, unspecified: Secondary | ICD-10-CM | POA: Insufficient documentation

## 2014-06-03 DIAGNOSIS — R059 Cough, unspecified: Secondary | ICD-10-CM

## 2014-06-03 DIAGNOSIS — R079 Chest pain, unspecified: Secondary | ICD-10-CM | POA: Diagnosis not present

## 2014-06-03 DIAGNOSIS — Z7982 Long term (current) use of aspirin: Secondary | ICD-10-CM | POA: Insufficient documentation

## 2014-06-03 DIAGNOSIS — R9431 Abnormal electrocardiogram [ECG] [EKG]: Secondary | ICD-10-CM

## 2014-06-03 DIAGNOSIS — Z86718 Personal history of other venous thrombosis and embolism: Secondary | ICD-10-CM | POA: Insufficient documentation

## 2014-06-03 DIAGNOSIS — R05 Cough: Secondary | ICD-10-CM | POA: Insufficient documentation

## 2014-06-03 DIAGNOSIS — Z87440 Personal history of urinary (tract) infections: Secondary | ICD-10-CM | POA: Diagnosis not present

## 2014-06-03 DIAGNOSIS — F419 Anxiety disorder, unspecified: Secondary | ICD-10-CM | POA: Diagnosis not present

## 2014-06-03 LAB — URINALYSIS, ROUTINE W REFLEX MICROSCOPIC
BILIRUBIN URINE: NEGATIVE
Glucose, UA: NEGATIVE mg/dL
HGB URINE DIPSTICK: NEGATIVE
Ketones, ur: NEGATIVE mg/dL
NITRITE: NEGATIVE
PH: 6 (ref 5.0–8.0)
Protein, ur: NEGATIVE mg/dL
SPECIFIC GRAVITY, URINE: 1.005 (ref 1.005–1.030)
Urobilinogen, UA: 0.2 mg/dL (ref 0.0–1.0)

## 2014-06-03 LAB — URINE MICROSCOPIC-ADD ON

## 2014-06-03 LAB — CBC WITH DIFFERENTIAL/PLATELET
BASOS PCT: 0 % (ref 0–1)
Basophils Absolute: 0 10*3/uL (ref 0.0–0.1)
EOS PCT: 0 % (ref 0–5)
Eosinophils Absolute: 0 10*3/uL (ref 0.0–0.7)
HEMATOCRIT: 38.6 % (ref 36.0–46.0)
Hemoglobin: 12.6 g/dL (ref 12.0–15.0)
Lymphocytes Relative: 12 % (ref 12–46)
Lymphs Abs: 0.5 10*3/uL — ABNORMAL LOW (ref 0.7–4.0)
MCH: 26.7 pg (ref 26.0–34.0)
MCHC: 32.6 g/dL (ref 30.0–36.0)
MCV: 81.8 fL (ref 78.0–100.0)
MONO ABS: 0.9 10*3/uL (ref 0.1–1.0)
Monocytes Relative: 21 % — ABNORMAL HIGH (ref 3–12)
Neutro Abs: 2.7 10*3/uL (ref 1.7–7.7)
Neutrophils Relative %: 67 % (ref 43–77)
Platelets: 147 10*3/uL — ABNORMAL LOW (ref 150–400)
RBC: 4.72 MIL/uL (ref 3.87–5.11)
RDW: 14.4 % (ref 11.5–15.5)
WBC: 4.1 10*3/uL (ref 4.0–10.5)

## 2014-06-03 LAB — TROPONIN I

## 2014-06-03 LAB — BASIC METABOLIC PANEL
Anion gap: 13 (ref 5–15)
BUN: 6 mg/dL (ref 6–23)
CHLORIDE: 105 mmol/L (ref 96–112)
CO2: 19 mmol/L (ref 19–32)
Calcium: 9.4 mg/dL (ref 8.4–10.5)
Creatinine, Ser: 1.21 mg/dL — ABNORMAL HIGH (ref 0.50–1.10)
GFR calc Af Amer: 55 mL/min — ABNORMAL LOW (ref >90)
GFR calc non Af Amer: 47 mL/min — ABNORMAL LOW (ref >90)
GLUCOSE: 92 mg/dL (ref 70–99)
Potassium: 3.5 mmol/L (ref 3.5–5.1)
Sodium: 137 mmol/L (ref 135–145)

## 2014-06-03 LAB — POCT URINALYSIS DIP (DEVICE)
BILIRUBIN URINE: NEGATIVE
Glucose, UA: NEGATIVE mg/dL
Ketones, ur: NEGATIVE mg/dL
Nitrite: NEGATIVE
Protein, ur: NEGATIVE mg/dL
Specific Gravity, Urine: 1.025 (ref 1.005–1.030)
Urobilinogen, UA: 0.2 mg/dL (ref 0.0–1.0)
pH: 5.5 (ref 5.0–8.0)

## 2014-06-03 LAB — BRAIN NATRIURETIC PEPTIDE: B Natriuretic Peptide: 282.3 pg/mL — ABNORMAL HIGH (ref 0.0–100.0)

## 2014-06-03 LAB — I-STAT TROPONIN, ED
TROPONIN I, POC: 0.01 ng/mL (ref 0.00–0.08)
Troponin i, poc: 0.01 ng/mL (ref 0.00–0.08)

## 2014-06-03 LAB — I-STAT CG4 LACTIC ACID, ED: Lactic Acid, Venous: 1.92 mmol/L (ref 0.5–2.0)

## 2014-06-03 MED ORDER — SODIUM CHLORIDE 0.9 % IV SOLN
INTRAVENOUS | Status: DC
  Administered 2014-06-03: 16:00:00 via INTRAVENOUS

## 2014-06-03 MED ORDER — ACETAMINOPHEN 325 MG PO TABS
ORAL_TABLET | ORAL | Status: AC
  Filled 2014-06-03: qty 2

## 2014-06-03 MED ORDER — IBUPROFEN 800 MG PO TABS
800.0000 mg | ORAL_TABLET | Freq: Once | ORAL | Status: DC
  Filled 2014-06-03: qty 1

## 2014-06-03 MED ORDER — ACETAMINOPHEN 325 MG PO TABS
650.0000 mg | ORAL_TABLET | Freq: Once | ORAL | Status: AC
  Administered 2014-06-03: 650 mg via ORAL

## 2014-06-03 NOTE — ED Notes (Signed)
Pt ambulated with ease to the restroom.  No SOB or chest pain at this time.

## 2014-06-03 NOTE — ED Provider Notes (Signed)
CSN: [id number]     Arrival date & time 06/03/14  1657 History   First MD Initiated Contact with Patient 06/03/14 1701     Chief Complaint  Patient presents with  . Chest Pain     (Consider location/radiation/quality/duration/timing/severity/associated sxs/prior Treatment) The history is provided by the patient and medical records.    This is a 64 year old female with past medical history significant for hypertension, depression, rheumatic heart disease status post mitral valve repair, remote history of post-op DVT off coumadin since 2011, sjogren's syndrome, CHF, presenting to the ED from urgent care for further evaluation of chest pain.  Patient states last night she was sleeping and had chest pain that awoke her from sleep.  She states she had mild SOB but no diaphoresis, nausea, vomiting, numbness/paresthesias, weakness, left arm or neck pain.  States she called EMS who came out to her home, performed an EKG that she was told was normal so she elected not to come to the hospital at that time. Patient had a wellness check today, so she had a fever and this was sent to urgent care. Upon arriving at urgent care EKG was performed with new Q waves.  Patient denies any recurrence of chest pain or SOB.  States she has been running a fever since yesterday around 1600.  She has recently had some nasal congestion and sinus pressure with dry cough.  She denies known sick contacts.  No chills, sweats, nausea, vomiting, diarrhea, or abdominal pain.  Denies recent LE edema, calf pain, or recent travel.    Past Medical History  Diagnosis Date  . Depression   . HTN (hypertension)   . Rheumatic heart disease     s/p mitral valve repair 1980  . Hypercholesteremia   . Insomnia   . Anxiety   . DVT (deep venous thrombosis)   . Pain, joint, multiple sites   . Fibromyalgia   . Connective tissue disease   . Arthritis   . Heart murmur   . Mitral stenosis   . Personal history of urinary calculi   . PONV  (postoperative nausea and vomiting)   . Peripheral vascular disease     hx dvt  . Stones in the urinary tract   . Refusal of blood transfusions as patient is Jehovah's Witness   . Nodule of right lung     RLL 53mm 09/23/12  . Sjogren's syndrome   . CHF (congestive heart failure)    Past Surgical History  Procedure Laterality Date  . Mitral valve repair  1980  . Tubal ligation  1980  . Vaginal hysterectomy  1992    for fibroids -- partial  . Cholecystectomy  11/27/2011    laproscopic  . Hernia repair  11/27/2011    laproscopic  . Cholecystectomy  11/27/2011    Procedure: LAPAROSCOPIC CHOLECYSTECTOMY WITH INTRAOPERATIVE CHOLANGIOGRAM;  Surgeon: Adin Hector, MD;  Location: [place] [facility];  Service: General;  Laterality: N/A;  laparoscopic cholecystectomy with choleangiogram umbilical hernia repair  . Umbilical hernia repair  11/27/2011    Procedure: HERNIA REPAIR UMBILICAL ADULT;  Surgeon: Adin Hector, MD;  Location: [place];  Service: General;  Laterality: N/A;   Family History  Problem Relation Age of Onset  . Lupus    . Kidney failure    . Bone cancer    . Heart disease Mother     MI in her 29s  . ALS Mother   . Kidney disease Father   . Alcohol abuse Father   .  Cancer Brother     bone  . Cancer Maternal Uncle     bone  . Depression Sister   . Drug abuse Sister   . Depression Sister   . Depression Sister   . Depression Sister   . Depression Sister    History  Substance Use Topics  . Smoking status: Never Smoker   . Smokeless tobacco: Never Used  . Alcohol Use: No   OB History    No data available     Review of Systems  Cardiovascular: Positive for chest pain (resolved).  All other systems reviewed and are negative.     Allergies  Cymbalta; Statins; and Sulfa antibiotics  Home Medications   Prior to Admission medications   Medication Sig Start Date End Date Taking? Authorizing Provider  amLODipine (NORVASC) 10 MG tablet Take 10 mg by mouth daily.     Historical Provider, MD  antiseptic oral rinse (BIOTENE) LIQD 15 mLs by Mouth Rinse route as needed (Oral hygiene).     Historical Provider, MD  aspirin 325 MG EC tablet Take 325 mg by mouth every morning.     Historical Provider, MD  buPROPion (WELLBUTRIN XL) 300 MG 24 hr tablet Take 1 tablet (300 mg total) by mouth every morning. 04/22/14 04/22/15  Cloria Spring, MD  cholecalciferol (VITAMIN D) 1000 UNITS tablet Take 1,000 Units by mouth every morning.     Historical Provider, MD  clonazePAM (KLONOPIN) 1 MG tablet Take 1 tablet (1 mg total) by mouth 3 (three) times daily as needed for anxiety. 04/22/14   Cloria Spring, MD  cycloSPORINE (RESTASIS) 0.05 % ophthalmic emulsion 1 drop 2 (two) times daily.    Historical Provider, MD  famotidine (PEPCID) 20 MG tablet Take 20 mg by mouth 2 (two) times daily.      Historical Provider, MD  furosemide (LASIX) 20 MG tablet TAKE 1 TABLET BY MOUTH EVERY DAY. 04/13/14   Lelon Perla, MD  gabapentin (NEURONTIN) 300 MG capsule Take 3 tablets three times a day 04/22/14   Cloria Spring, MD  hydroxychloroquine (PLAQUENIL) 200 MG tablet  01/12/14   Historical Provider, MD  metoprolol succinate (TOPROL-XL) 50 MG 24 hr tablet TAKE 1 TABLET BY MOUTH TWICE DAILY    Lelon Perla, MD  omeprazole (PRILOSEC) 20 MG capsule  01/07/14   Historical Provider, MD  pilocarpine (SALAGEN) 5 MG tablet  01/07/14   Historical Provider, MD  Polyethyl Glycol-Propyl Glycol (SYSTANE) 0.4-0.3 % GEL Apply 1 drop to eye at bedtime.    Historical Provider, MD  tiZANidine (ZANAFLEX) 4 MG tablet Take 4 mg by mouth at bedtime.     Historical Provider, MD  traMADol (ULTRAM) 50 MG tablet Take 100 mg by mouth 2 (two) times daily.     Historical Provider, MD  VOLTAREN 1 % GEL  11/08/13   Historical Provider, MD  ZETIA 10 MG tablet  01/12/14   Historical Provider, MD  zolpidem (AMBIEN CR) 12.5 MG CR tablet Take 1 tablet (12.5 mg total) by mouth at bedtime as needed for sleep. 04/22/14 04/22/15  Cloria Spring, MD   BP 123/68 mmHg  Pulse 93  Temp(Src) 100.4 F (38 C) (Oral)  Resp 22  Ht 5\' 1"  (1.549 m)  Wt 153 lb (69.4 kg)  BMI 28.92 kg/m2  SpO2 100%   Physical Exam  Constitutional: She is oriented to person, place, and time. She appears well-developed and well-nourished. No distress.  HENT:  Head: Normocephalic and atraumatic.  Mouth/Throat: Oropharynx is clear and moist.  Eyes: Conjunctivae and EOM are normal. Pupils are equal, round, and reactive to light.  Neck: Normal range of motion and full passive range of motion without pain. No spinous process tenderness present. No rigidity. No edema present.  No meningeal signs; full ROM  Cardiovascular: Normal rate, regular rhythm and normal heart sounds.   Pulmonary/Chest: Effort normal and breath sounds normal. No respiratory distress. She has no wheezes.  Abdominal: Soft. Bowel sounds are normal.  Musculoskeletal: Normal range of motion.  No calf asymmetry, tenderness, or palpable cords; negative Homan's sign; no overlying erythema or warmth to touch; DP pulses intact bilaterally  Neurological: She is alert and oriented to person, place, and time.  AAOx3, answering questions appropriately; equal strength UE and LE bilaterally; CN grossly intact; moves all extremities appropriately without ataxia; no focal neuro deficits or facial asymmetry appreciated  Skin: Skin is warm and dry. She is not diaphoretic.  Psychiatric: She has a normal mood and affect.  Nursing note and vitals reviewed.   ED Course  Procedures (including critical care time) Labs Review Labs Reviewed  CBC WITH DIFFERENTIAL/PLATELET - Abnormal; Notable for the following:    Platelets 147 (*)    Lymphs Abs 0.5 (*)    Monocytes Relative 21 (*)    All other components within normal limits  BASIC METABOLIC PANEL - Abnormal; Notable for the following:    Creatinine, Ser 1.21 (*)    GFR calc non Af Amer 47 (*)    GFR calc Af Amer 55 (*)    All other components within  normal limits  BRAIN NATRIURETIC PEPTIDE - Abnormal; Notable for the following:    B Natriuretic Peptide 282.3 (*)    All other components within normal limits  URINALYSIS, ROUTINE W REFLEX MICROSCOPIC - Abnormal; Notable for the following:    Leukocytes, UA SMALL (*)    All other components within normal limits  URINE MICROSCOPIC-ADD ON - Abnormal; Notable for the following:    Squamous Epithelial / LPF FEW (*)    Bacteria, UA FEW (*)    All other components within normal limits  TROPONIN I  I-STAT TROPOININ, ED  I-STAT CG4 LACTIC ACID, ED  I-STAT CG4 LACTIC ACID, ED  Randolm Idol, ED    Imaging Review Dg Chest 2 View  06/03/2014   CLINICAL DATA:  Fever and cough for 2 days. Collagen vascular disease. Mitral valve repair in 1980.  EXAM: CHEST  2 VIEW  COMPARISON:  CT 11/15/2013  FINDINGS: Midline sternotomy overlies normal cardiac silhouette. No effusion, infiltrate, or pneumothorax. No acute osseous abnormality.  IMPRESSION: No acute cardiopulmonary process.   Electronically Signed   By: Suzy Bouchard M.D.   On: 06/03/2014 17:48     EKG Interpretation   Date/Time:  Friday June 03 2014 17:17:21 EDT Ventricular Rate:  94 PR Interval:  145 QRS Duration: 84 QT Interval:  396 QTC Calculation: 495 R Axis:   78 Text Interpretation:  Sinus rhythm Borderline repolarization abnormality  Borderline prolonged QT interval Confirmed by Jeneen Rinks  MD, Carson ([id number]) on  06/03/2014 7:53:06 PM      MDM   Final diagnoses:  Chest pain  Cough   64 year old female with episode of chest pain yesterday but has since resolved. She was evaluated at urgent care earlier today, EKG with new Q-wave in inferior leads as well as worsening ST depression in V2 and V3 when compared with EKG from June 2015, no other recent EKG for  comparison. Patient has had no recurrence of pain since yesterday evening. She denies any associated symptoms. She does have a fever and has recently been having nasal  congestion, sinus pressure, and nonproductive cough. On exam, patient is febrile but nontoxic in appearance.  No clinical signs of meningitis, neuro exam is non-focal.  Will obtain lab work, chest x-ray.  Labwork overall reassuring, troponin negative. Chest x-ray is clear.  Patient does have noted RF thus delta troponin was obtained which is also negative.  Fever has resolved with tylenol, patient currently eating and drinking normally.  Suspect possible viral URI.  She remains without any recurrence of CP or SOB.  She does have remote hx of DVT post-operatively but has been off coumadin since 2011, no clinical signs of DVT on exam, and no hypoxia here today.  At this time, low suspicion for ACS, PE, dissection, or other acute cardiac event.  Patient will be d/c home with close PCP FU.  Discussed plan with patient, he/she acknowledged understanding and agreed with plan of care.  Strict return precautions given for new or worsening symptoms.  Case discussed with attending physician, Dr. Jeneen Rinks, who agrees with assessment and plan of care.  Larene Pickett, PA-C 06/03/14 Jane Lew, PA-C 06/03/14 2346  Tanna Furry, MD 06/04/14 [phone]

## 2014-06-03 NOTE — ED Provider Notes (Signed)
CSN: [id number]     Arrival date & time 06/03/14  1408 History   First MD Initiated Contact with Patient 06/03/14 1523     Chief Complaint  Patient presents with  . Fever   (Consider location/radiation/quality/duration/timing/severity/associated sxs/prior Treatment) HPI         64 year old female with history of CHF, Sjogren's syndrome, DVT, rheumatic heart disease status post mitral valve repair presents for evaluation of fever. She has had a fever since yesterday up to 102F at home. Also last night she experienced substernal chest pain. She called EMS, they did an EKG and told her she was fine and a follow-up with her doctor today. She has not had any chest pain since then. She denies any other systemic symptoms apart from a infrequent dry cough. She denies leg swelling, any continued chest pain, shortness of breath, NVD, abdominal pain, sore throat, sinus pressure, body aches, subjective chills. She denies any joint pain or swelling apart from her normal fibromyalgia pain.  Past Medical History  Diagnosis Date  . Depression   . HTN (hypertension)   . Rheumatic heart disease     s/p mitral valve repair 1980  . Hypercholesteremia   . Insomnia   . Anxiety   . DVT (deep venous thrombosis)   . Pain, joint, multiple sites   . Fibromyalgia   . Connective tissue disease   . Arthritis   . Heart murmur   . Mitral stenosis   . Personal history of urinary calculi   . PONV (postoperative nausea and vomiting)   . Peripheral vascular disease     hx dvt  . Stones in the urinary tract   . Refusal of blood transfusions as patient is Jehovah's Witness   . Nodule of right lung     RLL 15mm 09/23/12  . Sjogren's syndrome   . CHF (congestive heart failure)    Past Surgical History  Procedure Laterality Date  . Mitral valve repair  1980  . Tubal ligation  1980  . Vaginal hysterectomy  1992    for fibroids -- partial  . Cholecystectomy  11/27/2011    laproscopic  . Hernia repair  11/27/2011   laproscopic  . Cholecystectomy  11/27/2011    Procedure: LAPAROSCOPIC CHOLECYSTECTOMY WITH INTRAOPERATIVE CHOLANGIOGRAM;  Surgeon: Adin Hector, MD;  Location: [facility];  Service: General;  Laterality: N/A;  laparoscopic cholecystectomy with choleangiogram umbilical hernia repair  . Umbilical hernia repair  11/27/2011    Procedure: HERNIA REPAIR UMBILICAL ADULT;  Surgeon: Adin Hector, MD;  Location: [facility];  Service: General;  Laterality: N/A;   Family History  Problem Relation Age of Onset  . Lupus    . Kidney failure    . Bone cancer    . Heart disease Mother     MI in her 38s  . ALS Mother   . Kidney disease Father   . Alcohol abuse Father   . Cancer Brother     bone  . Cancer Maternal Uncle     bone  . Depression Sister   . Drug abuse Sister   . Depression Sister   . Depression Sister   . Depression Sister   . Depression Sister    History  Substance Use Topics  . Smoking status: Never Smoker   . Smokeless tobacco: Never Used  . Alcohol Use: No   OB History    No data available     Review of Systems  Constitutional: Positive for fever.  Respiratory:  Positive for cough.   Cardiovascular: Positive for chest pain.  All other systems reviewed and are negative.   Allergies  Cymbalta; Statins; and Sulfa antibiotics  Home Medications   Prior to Admission medications   Medication Sig Start Date End Date Taking? Authorizing Provider  amLODipine (NORVASC) 10 MG tablet Take 10 mg by mouth daily.    Historical Provider, MD  antiseptic oral rinse (BIOTENE) LIQD 15 mLs by Mouth Rinse route as needed (Oral hygiene).     Historical Provider, MD  aspirin 325 MG EC tablet Take 325 mg by mouth every morning.     Historical Provider, MD  buPROPion (WELLBUTRIN XL) 300 MG 24 hr tablet Take 1 tablet (300 mg total) by mouth every morning. 04/22/14 04/22/15  Cloria Spring, MD  cholecalciferol (VITAMIN D) 1000 UNITS tablet Take 1,000 Units by mouth every morning.     Historical  Provider, MD  clonazePAM (KLONOPIN) 1 MG tablet Take 1 tablet (1 mg total) by mouth 3 (three) times daily as needed for anxiety. 04/22/14   Cloria Spring, MD  cycloSPORINE (RESTASIS) 0.05 % ophthalmic emulsion 1 drop 2 (two) times daily.    Historical Provider, MD  famotidine (PEPCID) 20 MG tablet Take 20 mg by mouth 2 (two) times daily.      Historical Provider, MD  furosemide (LASIX) 20 MG tablet TAKE 1 TABLET BY MOUTH EVERY DAY. 04/13/14   Lelon Perla, MD  gabapentin (NEURONTIN) 300 MG capsule Take 3 tablets three times a day 04/22/14   Cloria Spring, MD  hydroxychloroquine (PLAQUENIL) 200 MG tablet  01/12/14   Historical Provider, MD  metoprolol succinate (TOPROL-XL) 50 MG 24 hr tablet TAKE 1 TABLET BY MOUTH TWICE DAILY    Lelon Perla, MD  omeprazole (PRILOSEC) 20 MG capsule  01/07/14   Historical Provider, MD  pilocarpine (SALAGEN) 5 MG tablet  01/07/14   Historical Provider, MD  Polyethyl Glycol-Propyl Glycol (SYSTANE) 0.4-0.3 % GEL Apply 1 drop to eye at bedtime.    Historical Provider, MD  tiZANidine (ZANAFLEX) 4 MG tablet Take 4 mg by mouth at bedtime.     Historical Provider, MD  traMADol (ULTRAM) 50 MG tablet Take 100 mg by mouth 2 (two) times daily.     Historical Provider, MD  VOLTAREN 1 % GEL  11/08/13   Historical Provider, MD  ZETIA 10 MG tablet  01/12/14   Historical Provider, MD  zolpidem (AMBIEN CR) 12.5 MG CR tablet Take 1 tablet (12.5 mg total) by mouth at bedtime as needed for sleep. 04/22/14 04/22/15  Cloria Spring, MD   BP 145/114 mmHg  Pulse 106  Temp(Src) 102.5 F (39.2 C) (Oral)  Resp 16  SpO2 98% Physical Exam  Constitutional: She is oriented to person, place, and time. Vital signs are normal. She appears well-developed and well-nourished. No distress.  HENT:  Head: Normocephalic and atraumatic.  Right Ear: External ear normal.  Left Ear: External ear normal.  Nose: Nose normal.  Mouth/Throat: Uvula is midline, oropharynx is clear and moist and mucous  membranes are normal. No oropharyngeal exudate or posterior oropharyngeal erythema.  Cardiovascular: Normal rate, regular rhythm and normal pulses.  Exam reveals distant heart sounds.   Murmur heard.  Decrescendo systolic murmur is present with a grade of 3/6  No rash, Janeway lesions, ulcers minutes, or other stigmata of infective endocarditis  Pulmonary/Chest: Effort normal and breath sounds normal. No accessory muscle usage. No respiratory distress. She has no wheezes. She has no  rhonchi. She has no rales.  Neurological: She is alert and oriented to person, place, and time. She has normal strength. Coordination normal.  Skin: Skin is warm and dry. No rash noted. She is not diaphoretic.  Psychiatric: She has a normal mood and affect. Judgment normal.  Nursing note and vitals reviewed.   ED Course  Procedures (including critical care time) Labs Review Labs Reviewed  POCT URINALYSIS DIP (DEVICE) - Abnormal; Notable for the following:    Hgb urine dipstick SMALL (*)    Leukocytes, UA TRACE (*)    All other components within normal limits    Imaging Review No results found.   MDM   1. Fever, unspecified fever cause   2. Other chest pain   3. Abnormal EKG    Her EKG from last night revealed no acute changes.  Today she has new q-waves in II, III, and aVF.  It appears that she may have suffered an MI last night when she was having the chest pain.  She will be transferred to the ED via carelink for evaluation.  She already took her 324 mg ASA this AM   Meds ordered this encounter  Medications  . acetaminophen (TYLENOL) tablet 650 mg    Sig:   . 0.9 %  sodium chloride infusion    Sig:        Liam Graham, PA-C 06/03/14 1611

## 2014-06-03 NOTE — ED Notes (Addendum)
Pt  Reports   Fever      Dry  Cough         Pt  Reports            Symptoms  Began   Yesterday     Pt  tooke  Her bp  meds  Today   Pt  Reported  Pain  Was  An  8  Last  Pm     Ems  Came  To  House   And  Did  ekg  Pt  Declined  Transport  To  hosp  Then  Pt  States  She  Took  Her  Diona Fanti  This  Am

## 2014-06-03 NOTE — Discharge Instructions (Signed)
Please follow-up with your primary care physician-- copies of labs and imaging studies attached for your review. Return to the ED for new or worsening symptoms-- specifically any return of chest pain, shortness of breath, diaphoresis, nausea, or vomiting.

## 2014-06-03 NOTE — ED Notes (Signed)
Lisa PA at bedside

## 2014-06-03 NOTE — ED Notes (Signed)
PA at bedside.

## 2014-06-03 NOTE — ED Notes (Signed)
Pt  Placed  On  Cardiac  Monitor  Nasal  o2  At  2 l  /  Min

## 2014-06-03 NOTE — ED Notes (Signed)
Pt arrives via EMS from Urgent Care. States that she had chest pain last night that went away after going to bed. She called EMS at the time and decided not to come to the hospital. Pt had a wellness check today and was told that she had a fever and urged her to go to Urgent Care. Urgent Care then sent pt to ED because of EKG. Urgent Care administered 650 Tylenol.

## 2014-06-03 NOTE — ED Notes (Signed)
No      carelink  Unit      Available

## 2014-06-07 ENCOUNTER — Telehealth (HOSPITAL_COMMUNITY): Payer: Self-pay | Admitting: *Deleted

## 2014-06-07 NOTE — Telephone Encounter (Signed)
Called insurance [phone] to start appeal process. Gave information for an expedited review, letter will be faxed with decision within 72 hours.

## 2014-06-14 DIAGNOSIS — Z0000 Encounter for general adult medical examination without abnormal findings: Secondary | ICD-10-CM | POA: Diagnosis not present

## 2014-06-14 DIAGNOSIS — R739 Hyperglycemia, unspecified: Secondary | ICD-10-CM | POA: Diagnosis not present

## 2014-06-14 DIAGNOSIS — I10 Essential (primary) hypertension: Secondary | ICD-10-CM | POA: Diagnosis not present

## 2014-06-14 DIAGNOSIS — E780 Pure hypercholesterolemia: Secondary | ICD-10-CM | POA: Diagnosis not present

## 2014-06-15 ENCOUNTER — Other Ambulatory Visit: Payer: Self-pay | Admitting: Cardiology

## 2014-06-21 ENCOUNTER — Encounter (HOSPITAL_COMMUNITY): Payer: Self-pay | Admitting: Psychiatry

## 2014-06-21 ENCOUNTER — Ambulatory Visit (INDEPENDENT_AMBULATORY_CARE_PROVIDER_SITE_OTHER): Payer: 59 | Admitting: Psychiatry

## 2014-06-21 VITALS — BP 93/61 | HR 77 | Ht 61.0 in | Wt 153.0 lb

## 2014-06-21 DIAGNOSIS — F331 Major depressive disorder, recurrent, moderate: Secondary | ICD-10-CM

## 2014-06-21 MED ORDER — BUPROPION HCL ER (XL) 300 MG PO TB24: 300.0000 mg | ORAL_TABLET | ORAL | Status: DC

## 2014-06-21 MED ORDER — GABAPENTIN 300 MG PO CAPS: ORAL_CAPSULE | ORAL | Status: DC
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
## 2014-07-06 ENCOUNTER — Other Ambulatory Visit: Payer: Self-pay | Admitting: Family Medicine

## 2014-07-06 DIAGNOSIS — Z1231 Encounter for screening mammogram for malignant neoplasm of breast: Secondary | ICD-10-CM

## 2014-07-13 ENCOUNTER — Ambulatory Visit (HOSPITAL_COMMUNITY)
Admission: RE | Admit: 2014-07-13 | Discharge: 2014-07-13 | Disposition: A | Discharge status: Home / Self Care | Payer: Medicare Other | Source: Ambulatory Visit | Attending: Family Medicine | Admitting: Family Medicine

## 2014-07-13 DIAGNOSIS — Z1231 Encounter for screening mammogram for malignant neoplasm of breast: Secondary | ICD-10-CM | POA: Insufficient documentation

## 2014-07-13 LAB — HM MAMMOGRAPHY: HM Mammogram: NORMAL

## 2014-07-14 ENCOUNTER — Encounter: Payer: Self-pay | Admitting: *Deleted

## 2014-07-17 HISTORY — PX: COLONOSCOPY: SHX174

## 2014-07-18 ENCOUNTER — Encounter: Payer: Self-pay | Admitting: Family Medicine

## 2014-07-18 ENCOUNTER — Ambulatory Visit (INDEPENDENT_AMBULATORY_CARE_PROVIDER_SITE_OTHER): Payer: Medicare Other | Admitting: Family Medicine

## 2014-07-18 VITALS — BP 124/76 | HR 76 | Temp 98.2°F | Ht 62.5 in | Wt 151.5 lb

## 2014-07-18 DIAGNOSIS — E7800 Pure hypercholesterolemia, unspecified: Secondary | ICD-10-CM

## 2014-07-18 DIAGNOSIS — F5104 Psychophysiologic insomnia: Secondary | ICD-10-CM

## 2014-07-18 DIAGNOSIS — G4700 Insomnia, unspecified: Secondary | ICD-10-CM

## 2014-07-18 DIAGNOSIS — F331 Major depressive disorder, recurrent, moderate: Secondary | ICD-10-CM

## 2014-07-18 DIAGNOSIS — Z9889 Other specified postprocedural states: Secondary | ICD-10-CM

## 2014-07-18 DIAGNOSIS — I10 Essential (primary) hypertension: Secondary | ICD-10-CM

## 2014-07-18 DIAGNOSIS — N289 Disorder of kidney and ureter, unspecified: Secondary | ICD-10-CM

## 2014-07-18 DIAGNOSIS — M797 Fibromyalgia: Secondary | ICD-10-CM

## 2014-07-18 DIAGNOSIS — Z789 Other specified health status: Secondary | ICD-10-CM | POA: Insufficient documentation

## 2014-07-18 DIAGNOSIS — M069 Rheumatoid arthritis, unspecified: Secondary | ICD-10-CM

## 2014-07-18 DIAGNOSIS — N183 Chronic kidney disease, stage 3 unspecified: Secondary | ICD-10-CM | POA: Insufficient documentation

## 2014-07-18 DIAGNOSIS — M3500 Sicca syndrome, unspecified: Secondary | ICD-10-CM

## 2014-07-18 DIAGNOSIS — I5032 Chronic diastolic (congestive) heart failure: Secondary | ICD-10-CM

## 2014-07-18 DIAGNOSIS — F411 Generalized anxiety disorder: Secondary | ICD-10-CM

## 2014-07-18 DIAGNOSIS — H409 Unspecified glaucoma: Secondary | ICD-10-CM

## 2014-07-18 DIAGNOSIS — I350 Nonrheumatic aortic (valve) stenosis: Secondary | ICD-10-CM

## 2014-07-18 DIAGNOSIS — M1990 Unspecified osteoarthritis, unspecified site: Secondary | ICD-10-CM | POA: Insufficient documentation

## 2014-07-18 DIAGNOSIS — IMO0001 Reserved for inherently not codable concepts without codable children: Secondary | ICD-10-CM

## 2014-07-18 DIAGNOSIS — R911 Solitary pulmonary nodule: Secondary | ICD-10-CM

## 2014-07-18 DIAGNOSIS — Z531 Procedure and treatment not carried out because of patient's decision for reasons of belief and group pressure: Secondary | ICD-10-CM | POA: Insufficient documentation

## 2014-07-18 DIAGNOSIS — Z86711 Personal history of pulmonary embolism: Secondary | ICD-10-CM

## 2014-07-18 NOTE — Assessment & Plan Note (Signed)
Chronic, stable. Continue current regimen. 

## 2014-07-18 NOTE — Assessment & Plan Note (Signed)
SBE ppx with amoxicillin. On ASA 325mg  daily.

## 2014-07-18 NOTE — Assessment & Plan Note (Signed)
Only on aspirin 325mg  daily. States she gets coumadin when she needs to be hospitalized.

## 2014-07-18 NOTE — Assessment & Plan Note (Signed)
Gets echo Q1 yr.

## 2014-07-18 NOTE — Assessment & Plan Note (Signed)
Intolerant to statins. Continue zetia.  

## 2014-07-18 NOTE — Assessment & Plan Note (Signed)
Stable on wellbutrin 300mg  XR daily. Followed by psych.

## 2014-07-18 NOTE — Assessment & Plan Note (Signed)
-

## 2014-07-18 NOTE — Patient Instructions (Signed)
Good to meet you today. Return as needed or in 3 months for medicare wellness visit. Call us with questions.

## 2014-07-18 NOTE — Progress Notes (Signed)
Pre visit review using our clinic review tool, if applicable. No additional management support is needed unless otherwise documented below in the visit note. 

## 2014-07-18 NOTE — Assessment & Plan Note (Signed)
Rheum records requested today. On plaquenil and gets regular eye exams

## 2014-07-18 NOTE — Progress Notes (Addendum)
BP 124/76 mmHg  Pulse 76  Temp(Src) 98.2 F (36.8 C) (Oral)  Ht 5' 2.5" (1.588 m)  Wt 151 lb 8 oz (68.72 kg)  BMI 27.25 kg/m2   CC: new pt to establish  Subjective:    Patient ID: Alexandria Leach, female    DOB: 1950/10/12, 64 y.o.   MRN: [id number]  HPI: Alexandria Leach is a 64 y.o. female presenting on 07/18/2014 for Establish Care   Prior saw Dr Maudie Mercury at [place] 2008 - disability for fibromyalgia.  HTN - compliant with amlodipine 10mg  daily. Lasix 20mg  prn swelling, toprol xl 1/2 in am and 1 at night time. No HA, vision changes, CP/tightness, SOB, leg swelling. No low bp sxs like dizziness or syncope.  Depression/anxiety - on wellbutrin 300mg  daily, klonopin 1mg  tid. Followed by Dr Harrington Challenger psychiatrist in [place].   HLD - intolerant of statins. On zetia.  GERD - on pepcid 20mg  bid.  CKD - sees nephrologist (Dr Harden Mo)  Rheumatoid arthritis/sjogren syndrome and fibromyalgia - Dr Estanislado Pandy. On plaquenil 300mg    S/p mitral valve repair after rheumatic fever x3 - after 4th pregnancy had CHF. Lasix prn. S/p open heart surgery 1980. now with some aortic stenosis. Sees Dr Stanford Breed once yearly for echocardiogram. Due for f/u.  H/o DVT several times and PE x2, completed coumadin for 1 year. Receives coumadin while hospitalized but not regularly.   Preventative: Mammogram normal 06/2014 Well woman - s/p hysterectomy. Latest pap normal 2014  Lives with husband and son, 1 dog Occupation: unemployed, on disability for fibromyalgia sine 2008. Edu: HS Religion: Jehova's witness Activity: volunteers at senior center Diet: some water, fruits/vegetables daily  Relevant past medical, surgical, family and social history reviewed and updated as indicated. Interim medical history since our last visit reviewed. Allergies and medications reviewed and updated. Current Outpatient Prescriptions on File Prior to Visit  Medication Sig  . amLODipine (NORVASC) 10 MG  tablet Take 10 mg by mouth daily.  Marland Kitchen amoxicillin (AMOXIL) 500 MG capsule Take 2,000 mg by mouth once. FOR DENTAL PROCEDURE  . antiseptic oral rinse (BIOTENE) LIQD 15 mLs by Mouth Rinse route as needed (Oral hygiene).   Marland Kitchen aspirin 325 MG EC tablet Take 325 mg by mouth every morning.   Marland Kitchen buPROPion (WELLBUTRIN XL) 300 MG 24 hr tablet Take 1 tablet (300 mg total) by mouth every morning.  . cholecalciferol (VITAMIN D) 1000 UNITS tablet Take 1,000 Units by mouth every morning.   . clonazePAM (KLONOPIN) 1 MG tablet Take 1 tablet (1 mg total) by mouth 3 (three) times daily as needed for anxiety.  . cycloSPORINE (RESTASIS) 0.05 % ophthalmic emulsion Place 1 drop into both eyes 2 (two) times daily.   . famotidine (PEPCID) 20 MG tablet Take 20 mg by mouth 2 (two) times daily.    Vladimir Faster Glycol-Propyl Glycol (SYSTANE) 0.4-0.3 % GEL Apply 1 drop to eye 2 (two) times daily.   Marland Kitchen tiZANidine (ZANAFLEX) 4 MG tablet Take 4 mg by mouth every 8 (eight) hours as needed for muscle spasms.   . traMADol (ULTRAM) 50 MG tablet Take 50 mg by mouth every 12 (twelve) hours as needed for severe pain.   Marland Kitchen VOLTAREN 1 % GEL Apply 2 g topically as needed (FOR PAIN).   Marland Kitchen ZETIA 10 MG tablet Take 10 mg by mouth daily.   Marland Kitchen zolpidem (AMBIEN CR) 12.5 MG CR tablet Take 1 tablet (12.5 mg total) by mouth at bedtime as needed for sleep.  No current facility-administered medications on file prior to visit.    Review of Systems Per HPI unless specifically indicated in ROS section - all other systems negative.    Objective:    BP 124/76 mmHg  Pulse 76  Temp(Src) 98.2 F (36.8 C) (Oral)  Ht 5' 2.5" (1.588 m)  Wt 151 lb 8 oz (68.72 kg)  BMI 27.25 kg/m2  Wt Readings from Last 3 Encounters:  07/18/14 151 lb 8 oz (68.72 kg)  06/21/14 153 lb (69.4 kg)  06/03/14 153 lb (69.4 kg)    Physical Exam  Constitutional: She appears well-developed and well-nourished. No distress.  HENT:  Head: Normocephalic and atraumatic.    Mouth/Throat: Oropharynx is clear and moist. No oropharyngeal exudate.  Eyes: Conjunctivae and EOM are normal. Pupils are equal, round, and reactive to light. No scleral icterus.  Neck: Normal range of motion. Neck supple. No thyromegaly present.  Cardiovascular: Normal rate, regular rhythm, normal heart sounds and intact distal pulses.   No murmur heard. m not appreciated today  Pulmonary/Chest: Effort normal and breath sounds normal. No respiratory distress. She has no wheezes. She has no rales.  Musculoskeletal: She exhibits no edema.  Lymphadenopathy:    She has no cervical adenopathy.  Skin: Skin is warm and dry. No rash noted.  Psychiatric: She has a normal mood and affect.  Nursing note and vitals reviewed.  Results for orders placed or performed in visit on 07/14/14  HM MAMMOGRAPHY  Result Value Ref Range   HM Mammogram Normal Birads 1-repeat 1 year       Assessment & Plan:   Problem List Items Addressed This Visit    Sjogren's syndrome    Followed by rheum Dr Estanislado Pandy. Records requested today. On restasis and plaquenil.      Rheumatoid arthritis    Rheum records requested today. On plaquenil and gets regular eye exams      Relevant Medications   hydroxychloroquine (PLAQUENIL) 200 MG tablet   Refusal of blood transfusions as patient is Jehovah's Witness   MITRAL VALVE REPLACEMENT, HX OF    SBE ppx with amoxicillin. On ASA 325mg  daily.      MDD (major depressive disorder), recurrent episode    Stable on wellbutrin 300mg  XR daily. Followed by psych.      Lung nodule    Followed yearly by Dr Cyndia Bent with CT scan.      Kidney disease    Requested records from PCP and nephrologist      HYPERCHOLESTEROLEMIA    Intolerant to statins. Continue zetia.      Relevant Medications   furosemide (LASIX) 20 MG tablet   metoprolol succinate (TOPROL-XL) 50 MG 24 hr tablet   History of pulmonary embolism    Only on aspirin 325mg  daily. States she gets coumadin when  she needs to be hospitalized.      Glaucoma    Regularly sees ophtho.      GAD (generalized anxiety disorder)    Stable on klonopin 1mg  TID scheduled. Followed by psychiatrist Dr Harrington Challenger in [place].      Fibromyalgia    On gabapentin 300mg  TID.      Essential hypertension - Primary    Chronic, stable. Continue current regimen.      Relevant Medications   furosemide (LASIX) 20 MG tablet   metoprolol succinate (TOPROL-XL) 50 MG 24 hr tablet   Diastolic CHF   Relevant Medications   furosemide (LASIX) 20 MG tablet   metoprolol succinate (TOPROL-XL) 50 MG 24  hr tablet   Chronic insomnia    Continue ambien Cr 12.5mg  daily      Aortic stenosis    Gets echo Q1 yr.      Relevant Medications   furosemide (LASIX) 20 MG tablet   metoprolol succinate (TOPROL-XL) 50 MG 24 hr tablet       Follow up plan: Return in about 3 months (around 10/18/2014), or as needed, for medicare wellness.

## 2014-07-18 NOTE — Assessment & Plan Note (Signed)
Stable on klonopin 1mg  TID scheduled. Followed by psychiatrist Dr Harrington Challenger in [place].

## 2014-07-18 NOTE — Assessment & Plan Note (Signed)
Continue ambien Cr 12.5mg  daily

## 2014-07-18 NOTE — Assessment & Plan Note (Addendum)
Followed by rheum Dr Estanislado Pandy. Records requested today. On restasis and plaquenil.

## 2014-07-18 NOTE — Assessment & Plan Note (Signed)
Regularly sees ophtho.  

## 2014-07-18 NOTE — Assessment & Plan Note (Signed)
Requested records from PCP and nephrologist

## 2014-07-18 NOTE — Assessment & Plan Note (Signed)
Followed yearly by Dr Cyndia Bent with CT scan.

## 2014-07-23 ENCOUNTER — Encounter: Payer: Self-pay | Admitting: Family Medicine

## 2014-07-26 ENCOUNTER — Other Ambulatory Visit: Payer: Self-pay | Admitting: *Deleted

## 2014-07-26 MED ORDER — METOPROLOL SUCCINATE ER 50 MG PO TB24: ORAL_TABLET | ORAL | Status: DC

## 2014-07-26 MED ORDER — AMLODIPINE BESYLATE 10 MG PO TABS: 10.0000 mg | ORAL_TABLET | Freq: Every day | ORAL | Status: DC

## 2014-07-26 MED ORDER — AMOXICILLIN 500 MG PO CAPS: 2000.0000 mg | ORAL_CAPSULE | Freq: Once | ORAL | Status: DC

## 2014-07-26 MED ORDER — ZOLPIDEM TARTRATE ER 12.5 MG PO TBCR
12.5000 mg | EXTENDED_RELEASE_TABLET | Freq: Every evening | ORAL | Status: DC | PRN
Start: 2014-07-26 — End: 2014-09-20

## 2014-07-26 MED ORDER — GABAPENTIN 300 MG PO CAPS: ORAL_CAPSULE | ORAL | Status: DC

## 2014-07-26 MED ORDER — BUPROPION HCL ER (XL) 300 MG PO TB24: 300.0000 mg | ORAL_TABLET | ORAL | Status: DC

## 2014-07-26 MED ORDER — CYCLOSPORINE 0.05 % OP EMUL: 1.0000 [drp] | Freq: Two times a day (BID) | OPHTHALMIC | Status: DC

## 2014-07-26 MED ORDER — TRAMADOL HCL 50 MG PO TABS: 50.0000 mg | ORAL_TABLET | Freq: Two times a day (BID) | ORAL | Status: DC | PRN

## 2014-07-26 MED ORDER — TIZANIDINE HCL 4 MG PO TABS: 4.0000 mg | ORAL_TABLET | Freq: Three times a day (TID) | ORAL | Status: DC | PRN

## 2014-07-26 MED ORDER — CLONAZEPAM 1 MG PO TABS: 1.0000 mg | ORAL_TABLET | Freq: Three times a day (TID) | ORAL | Status: DC | PRN

## 2014-07-26 NOTE — Telephone Encounter (Signed)
Will do 1 mo supply with [facility] to mail order.

## 2014-07-26 NOTE — Telephone Encounter (Signed)
Rx's faxed to [facility].

## 2014-07-26 NOTE — Telephone Encounter (Addendum)
Ok to refill? Wants to go to mail order. Please print so I can fax. Thanks!

## 2014-07-27 ENCOUNTER — Other Ambulatory Visit: Payer: Self-pay | Admitting: *Deleted

## 2014-07-27 DIAGNOSIS — Z1211 Encounter for screening for malignant neoplasm of colon: Secondary | ICD-10-CM | POA: Diagnosis not present

## 2014-07-27 LAB — HM COLONOSCOPY

## 2014-07-27 MED ORDER — FUROSEMIDE 20 MG PO TABS: 20.0000 mg | ORAL_TABLET | Freq: Every day | ORAL | Status: DC | PRN

## 2014-07-27 MED ORDER — HYDROXYCHLOROQUINE SULFATE 200 MG PO TABS: 200.0000 mg | ORAL_TABLET | Freq: Every day | ORAL | Status: DC

## 2014-07-27 MED ORDER — FAMOTIDINE 20 MG PO TABS: 20.0000 mg | ORAL_TABLET | Freq: Two times a day (BID) | ORAL | Status: DC

## 2014-07-28 DIAGNOSIS — M797 Fibromyalgia: Secondary | ICD-10-CM | POA: Diagnosis not present

## 2014-07-28 DIAGNOSIS — M7071 Other bursitis of hip, right hip: Secondary | ICD-10-CM | POA: Diagnosis not present

## 2014-07-28 DIAGNOSIS — N189 Chronic kidney disease, unspecified: Secondary | ICD-10-CM | POA: Diagnosis not present

## 2014-07-28 DIAGNOSIS — M533 Sacrococcygeal disorders, not elsewhere classified: Secondary | ICD-10-CM | POA: Diagnosis not present

## 2014-08-02 ENCOUNTER — Encounter: Payer: Self-pay | Admitting: Family Medicine

## 2014-08-08 DIAGNOSIS — M069 Rheumatoid arthritis, unspecified: Secondary | ICD-10-CM | POA: Diagnosis not present

## 2014-08-08 DIAGNOSIS — Z79899 Other long term (current) drug therapy: Secondary | ICD-10-CM | POA: Diagnosis not present

## 2014-08-23 ENCOUNTER — Telehealth: Payer: Self-pay | Admitting: Cardiology

## 2014-08-23 NOTE — Telephone Encounter (Signed)
Dialed, goes directly to VM, left message on machine.

## 2014-08-23 NOTE — Telephone Encounter (Signed)
Pt return call came while I was away from desk - I did not receive notification.  Patient was put through to my VM box, a message was left with no information, no discernable dialogue - sounded like patient was not aware call was still connected.  Attempted to return call, call went straight to VM on listed mobile number. Called home number, went to VM.  Left message requesting patient call back.

## 2014-08-23 NOTE — Telephone Encounter (Signed)
Follow up        Pt returning [place] phone call

## 2014-08-23 NOTE — Telephone Encounter (Signed)
Patient states she has been having some chest pain that comes and goes and some shortness of breath.  Please call.

## 2014-08-23 NOTE — Telephone Encounter (Signed)
Left message for patient to call and schedule appointment.

## 2014-08-23 NOTE — Telephone Encounter (Signed)
Pt reports dyspnea x3 weeks, stated "i want to schedule my appt w/ Dr. Stanford Breed, it's time for my echo".  Beyond dyspnea, pt denies other symptoms. She does not wish to be seen by another provider unless it is [facility] Merle.  She has not been seen by Dr. Stanford Breed since Feb 2014.  Pt did not want to remain on line for appt to be scheduled. She does not wish to be seen urgently in place of being seen by preferred providers.  I did advise urgent care/ED dependent on worse/changing symptoms. Pt voiced understanding.  Will send to scheduling.

## 2014-09-07 ENCOUNTER — Other Ambulatory Visit: Payer: Self-pay | Admitting: *Deleted

## 2014-09-07 NOTE — Telephone Encounter (Signed)
Ok to refill to mail order? Will need written Rx for faxing.

## 2014-09-08 MED ORDER — TRAMADOL HCL 50 MG PO TABS: 50.0000 mg | ORAL_TABLET | Freq: Two times a day (BID) | ORAL | Status: DC | PRN

## 2014-09-08 NOTE — Telephone Encounter (Signed)
Printed and in Kim's box 

## 2014-09-08 NOTE — Telephone Encounter (Signed)
Rx faxed to mail order 

## 2014-09-20 ENCOUNTER — Encounter (HOSPITAL_COMMUNITY): Payer: Self-pay | Admitting: Psychiatry

## 2014-09-20 ENCOUNTER — Ambulatory Visit (INDEPENDENT_AMBULATORY_CARE_PROVIDER_SITE_OTHER): Payer: 59 | Admitting: Psychiatry

## 2014-09-20 VITALS — BP 127/77 | HR 75 | Ht 62.5 in | Wt 154.0 lb

## 2014-09-20 DIAGNOSIS — F332 Major depressive disorder, recurrent severe without psychotic features: Secondary | ICD-10-CM | POA: Diagnosis not present

## 2014-09-20 DIAGNOSIS — F331 Major depressive disorder, recurrent, moderate: Secondary | ICD-10-CM

## 2014-09-20 MED ORDER — ZOLPIDEM TARTRATE ER 12.5 MG PO TBCR: 12.5000 mg | EXTENDED_RELEASE_TABLET | Freq: Every evening | ORAL | Status: DC | PRN

## 2014-09-20 MED ORDER — BUPROPION HCL ER (XL) 300 MG PO TB24: 300.0000 mg | ORAL_TABLET | ORAL | Status: DC

## 2014-09-20 MED ORDER — CLONAZEPAM 1 MG PO TABS: 1.0000 mg | ORAL_TABLET | Freq: Three times a day (TID) | ORAL | Status: DC | PRN

## 2014-09-20 MED ORDER — ESCITALOPRAM OXALATE 20 MG PO TABS
20.0000 mg | ORAL_TABLET | Freq: Every day | ORAL | Status: DC
Start: 2014-09-20 — End: 2014-11-11
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
## 2014-09-21 ENCOUNTER — Telehealth: Payer: Self-pay | Admitting: Family Medicine

## 2014-09-21 DIAGNOSIS — M069 Rheumatoid arthritis, unspecified: Secondary | ICD-10-CM

## 2014-09-21 DIAGNOSIS — F331 Major depressive disorder, recurrent, moderate: Secondary | ICD-10-CM

## 2014-09-21 NOTE — Telephone Encounter (Signed)
Pt was advised by psychiatrist that she is the only provider that should write clonopin, lexapro, wellbutrin and ambien Also, Dr Jaquelyn Bitter should be the only provider to write for Tramadol.

## 2014-09-21 NOTE — Telephone Encounter (Signed)
That is fine 

## 2014-10-10 ENCOUNTER — Ambulatory Visit (INDEPENDENT_AMBULATORY_CARE_PROVIDER_SITE_OTHER)
Admission: RE | Admit: 2014-10-10 | Discharge: 2014-10-10 | Disposition: A | Discharge status: Home / Self Care | Payer: Medicare Other | Source: Ambulatory Visit | Attending: Family Medicine | Admitting: Family Medicine

## 2014-10-10 ENCOUNTER — Encounter: Payer: Self-pay | Admitting: Family Medicine

## 2014-10-10 ENCOUNTER — Ambulatory Visit (INDEPENDENT_AMBULATORY_CARE_PROVIDER_SITE_OTHER): Payer: Medicare Other | Admitting: Family Medicine

## 2014-10-10 VITALS — BP 114/78 | HR 69 | Temp 98.6°F | Ht 62.5 in | Wt 154.0 lb

## 2014-10-10 DIAGNOSIS — S8000XA Contusion of unspecified knee, initial encounter: Secondary | ICD-10-CM | POA: Insufficient documentation

## 2014-10-10 DIAGNOSIS — S8001XA Contusion of right knee, initial encounter: Secondary | ICD-10-CM

## 2014-10-10 NOTE — Assessment & Plan Note (Signed)
S/p fall on Friday on pavement - square on both knees in pt with rheum disease and neuropathy Enc to use walker to prevent falls  Abrasion care- enc soap and water and abx ointment   For pain - tramadol prn with caution / ice (10 min at a time) and elevation when able  xr of R knee today to r/o patellar fx

## 2014-10-10 NOTE — Progress Notes (Signed)
Subjective:    Patient ID: Alexandria Leach, female    DOB: 01/26/1951, 64 y.o.   MRN: [id number]  HPI Here for knee pain after a fall on Friday  Lost balance on Friday getting out of her car - (has neuropathy)  Went down on both knees  Had help getting up  Both knees were skinned - she got them cleaned up and then went home (used wet wipes)   R is worse than the left  (also has hip problems and RA)   Skinnned knees - sore   R knee hurts medially -no swelling L knee- just skin pain -no swelling   She takes plaquenil  Has tramadol - has taken several doses  Her doctors do not want her on nsaids - due to her heart valve problems   Patient Active Problem List   Diagnosis Date Noted  . Contusion of knee 10/10/2014  . Refusal of blood transfusions as patient is Jehovah's Witness   . Sjogren's syndrome   . Kidney disease   . Glaucoma   . Fibromyalgia   . Rheumatoid arthritis   . History of pulmonary embolism   . Lung nodule 09/22/2012  . Diastolic CHF 05/39/7673  . Pneumonia 12/02/2011  . Aortic stenosis 04/22/2011  . Palpitations 08/10/2010  . Dyspnea 08/10/2010  . HOT FLASHES 06/28/2008  . BURSITIS, HIP 06/28/2008  . ARTHRALGIA 07/01/2007  . BACK PAIN, LEFT 08/25/2006  . GAD (generalized anxiety disorder) 03/28/2006  . MITRAL VALVE REPLACEMENT, HX OF 03/28/2006  . TAH/BSO, HX OF 03/28/2006  . HYPERCHOLESTEROLEMIA 12/31/2005  . MDD (major depressive disorder), recurrent episode 12/31/2005  . RHEUMATIC HEART DISEASE 12/31/2005  . Essential hypertension 12/31/2005  . Chronic insomnia 12/31/2005   Past Medical History  Diagnosis Date  . Depression   . HTN (hypertension)   . Rheumatic heart disease 1980    s/p mitral valve repair 1980  . HLD (hyperlipidemia)   . Insomnia   . Anxiety   . History of DVT (deep vein thrombosis) several times latest 2012    receives coumadin while hospitalized  . History of pulmonary embolism 2001, 2006    completed coumadin  courses  . Fibromyalgia   . Connective tissue disease   . Rheumatoid arthritis     vs osteoarthritis?  . Personal history of urinary calculi latest 2014  . PONV (postoperative nausea and vomiting)   . Refusal of blood transfusions as patient is Jehovah's Witness   . Nodule of right lung     RLL 66mm 09/23/12  . Sjogren's syndrome   . CHF (congestive heart failure)   . Glaucoma     s/p surgery, sees ophtho Q6 mo  . Kidney disease     saw nephrologist Dr Harden Mo  . History of rheumatic fever x3   Past Surgical History  Procedure Laterality Date  . Mitral valve repair  1980    open heart  . Tubal ligation  1980  . Vaginal hysterectomy  1992    for fibroids -- partial, ovaries remain  . Cholecystectomy  11/27/2011    Procedure: LAPAROSCOPIC CHOLECYSTECTOMY WITH INTRAOPERATIVE CHOLANGIOGRAM;  Surgeon: Adin Hector, MD;  Location: [facility];  Service: General;  Laterality: N/A;  laparoscopic cholecystectomy with choleangiogram umbilical hernia repair  . Umbilical hernia repair  11/27/2011    Procedure: HERNIA REPAIR UMBILICAL ADULT;  Surgeon: Adin Hector, MD;  Location: [facility];  Service: General;  Laterality: N/A;  . Breast biopsy Right 2006  benign  . Colonoscopy  07/2014    WNL Amedeo Plenty)   History  Substance Use Topics  . Smoking status: Never Smoker   . Smokeless tobacco: Never Used  . Alcohol Use: No   Family History  Problem Relation Age of Onset  . Lupus Sister     and niece  . Cancer Mother     lung (nonsmoker)  . CAD Mother     MI in her 44s  . ALS Mother   . Kidney disease Father   . Alcohol abuse Father   . Cancer Brother     bone  . Cancer Maternal Uncle     bone  . Depression Sister   . Kidney failure Other     on HD  . Diabetes Father   . Diabetes Brother   . Diabetes Sister   . Stroke Maternal Grandmother   . Stroke Sister    Allergies  Allergen Reactions  . Cymbalta [Duloxetine Hcl] Other (See Comments)    tachycardia  . Statins Nausea Only  and Other (See Comments)    Muscle cramps also  . Sulfa Antibiotics Nausea And Vomiting   Current Outpatient Prescriptions on File Prior to Visit  Medication Sig Dispense Refill  . amLODipine (NORVASC) 10 MG tablet Take 1 tablet (10 mg total) by mouth daily. 90 tablet 1  . amoxicillin (AMOXIL) 500 MG capsule Take 4 capsules (2,000 mg total) by mouth once. FOR DENTAL PROCEDURE 4 capsule 2  . antiseptic oral rinse (BIOTENE) LIQD 15 mLs by Mouth Rinse route as needed (Oral hygiene).     Marland Kitchen aspirin 325 MG EC tablet Take 325 mg by mouth every morning.     Marland Kitchen buPROPion (WELLBUTRIN XL) 300 MG 24 hr tablet Take 1 tablet (300 mg total) by mouth every morning. 90 tablet 2  . cholecalciferol (VITAMIN D) 1000 UNITS tablet Take 1,000 Units by mouth every morning.     . clonazePAM (KLONOPIN) 1 MG tablet Take 1 tablet (1 mg total) by mouth 3 (three) times daily as needed for anxiety. 270 tablet 2  . cycloSPORINE (RESTASIS) 0.05 % ophthalmic emulsion Place 1 drop into both eyes 2 (two) times daily. 3 each 2  . escitalopram (LEXAPRO) 20 MG tablet Take 1 tablet (20 mg total) by mouth daily. 90 tablet 2  . famotidine (PEPCID) 20 MG tablet Take 1 tablet (20 mg total) by mouth 2 (two) times daily. 180 tablet 1  . furosemide (LASIX) 20 MG tablet Take 1 tablet (20 mg total) by mouth daily as needed for edema. 90 tablet 1  . gabapentin (NEURONTIN) 300 MG capsule Take 1 tablet three times a day 270 capsule 2  . hydroxychloroquine (PLAQUENIL) 200 MG tablet Take 1 tablet (200 mg total) by mouth daily. (Patient taking differently: Take 200 mg by mouth 2 (two) times daily. ) 90 tablet 1  . metoprolol succinate (TOPROL-XL) 50 MG 24 hr tablet Take 1/2 tablet in the morning and 1 tablet at night time 135 tablet 2  . Polyethyl Glycol-Propyl Glycol (SYSTANE) 0.4-0.3 % GEL Apply 1 drop to eye 2 (two) times daily.     Marland Kitchen tiZANidine (ZANAFLEX) 4 MG tablet Take 1 tablet (4 mg total) by mouth every 8 (eight) hours as needed for muscle  spasms. 30 tablet 3  . traMADol (ULTRAM) 50 MG tablet Take 1 tablet (50 mg total) by mouth every 12 (twelve) hours as needed for severe pain. 30 tablet 3  . VOLTAREN 1 % GEL Apply 2  g topically as needed (FOR PAIN).   3  . ZETIA 10 MG tablet Take 10 mg by mouth daily.   13  . zolpidem (AMBIEN CR) 12.5 MG CR tablet Take 1 tablet (12.5 mg total) by mouth at bedtime as needed for sleep. 90 tablet 2   No current facility-administered medications on file prior to visit.     Review of Systems Review of Systems  Constitutional: Negative for fever, appetite change, fatigue and unexpected weight change.  Eyes: Negative for pain and visual disturbance.  Respiratory: Negative for cough and shortness of breath.   Cardiovascular: Negative for cp or palpitations    Gastrointestinal: Negative for nausea, diarrhea and constipation.  Genitourinary: Negative for urgency and frequency.  Skin: Negative for pallor or rash   MSK pos for joint pain, pos for bilat knee pain from fall as well  Neurological: Negative for weakness, light-headedness, numbness and headaches.  Hematological: Negative for adenopathy. Does not bruise/bleed easily.  Psychiatric/Behavioral: Negative for dysphoric mood. The patient is not nervous/anxious.         Objective:   Physical Exam  Constitutional: She appears well-developed and well-nourished. No distress.  overwt and well appearing  Has her rolling walker with her   HENT:  Head: Normocephalic and atraumatic.  Neck: Normal range of motion. Neck supple.  Musculoskeletal: She exhibits edema and tenderness.  R knee- tender/ecchymosis and swelling on top of patella No joint line swelling  Abrasion- clean and healing  Cannot detect effusion Warm without erythema Stable on exam  Flex - just past 90 deg No s/s of meniscal injury   L knee- ecchymosis and less swelling on patella Less tender than R  No joint line tenderness Nl rom  Similar abrasions  Stable     Lymphadenopathy:    She has no cervical adenopathy.  Neurological: She is alert. She has normal reflexes.  Skin: Skin is warm and dry. No rash noted.  Psychiatric: She has a normal mood and affect.          Assessment & Plan:   Problem List Items Addressed This Visit    Contusion of knee - Primary    S/p fall on Friday on pavement - square on both knees in pt with rheum disease and neuropathy Enc to use walker to prevent falls  Abrasion care- enc soap and water and abx ointment   For pain - tramadol prn with caution / ice (10 min at a time) and elevation when able  xr of R knee today to r/o patellar fx        Relevant Orders   DG Knee AP/LAT W/Sunrise Right

## 2014-10-10 NOTE — Patient Instructions (Signed)
Xray of right knee today to rule out fracture  Use cold compress for 10 minutes at a time when able  Keep the abrasions clean with soap and water and use an antibiotic ointment over the counter (triple antibiotic or bacitracin are fine)  If not starting to improve next week let us know

## 2014-10-10 NOTE — Progress Notes (Signed)
Pre visit review using our clinic review tool, if applicable. No additional management support is needed unless otherwise documented below in the visit note. 

## 2014-10-13 DIAGNOSIS — R296 Repeated falls: Secondary | ICD-10-CM | POA: Diagnosis not present

## 2014-10-13 DIAGNOSIS — M3500 Sicca syndrome, unspecified: Secondary | ICD-10-CM | POA: Diagnosis not present

## 2014-10-13 DIAGNOSIS — I951 Orthostatic hypotension: Secondary | ICD-10-CM | POA: Diagnosis not present

## 2014-10-13 DIAGNOSIS — M797 Fibromyalgia: Secondary | ICD-10-CM | POA: Diagnosis not present

## 2014-10-14 ENCOUNTER — Telehealth: Payer: Self-pay | Admitting: Cardiology

## 2014-10-14 NOTE — Telephone Encounter (Signed)
Closed encounter °

## 2014-10-18 ENCOUNTER — Encounter: Payer: Self-pay | Admitting: Family Medicine

## 2014-10-18 ENCOUNTER — Ambulatory Visit (INDEPENDENT_AMBULATORY_CARE_PROVIDER_SITE_OTHER): Payer: Medicare Other | Admitting: Family Medicine

## 2014-10-18 VITALS — BP 110/60 | HR 64 | Temp 97.7°F | Ht 62.0 in | Wt 152.2 lb

## 2014-10-18 DIAGNOSIS — N289 Disorder of kidney and ureter, unspecified: Secondary | ICD-10-CM

## 2014-10-18 DIAGNOSIS — S8001XA Contusion of right knee, initial encounter: Secondary | ICD-10-CM

## 2014-10-18 DIAGNOSIS — F411 Generalized anxiety disorder: Secondary | ICD-10-CM

## 2014-10-18 DIAGNOSIS — Z0000 Encounter for general adult medical examination without abnormal findings: Secondary | ICD-10-CM | POA: Diagnosis not present

## 2014-10-18 DIAGNOSIS — Z7189 Other specified counseling: Secondary | ICD-10-CM

## 2014-10-18 DIAGNOSIS — E7800 Pure hypercholesterolemia, unspecified: Secondary | ICD-10-CM

## 2014-10-18 DIAGNOSIS — I5032 Chronic diastolic (congestive) heart failure: Secondary | ICD-10-CM

## 2014-10-18 DIAGNOSIS — F5104 Psychophysiologic insomnia: Secondary | ICD-10-CM

## 2014-10-18 DIAGNOSIS — F331 Major depressive disorder, recurrent, moderate: Secondary | ICD-10-CM

## 2014-10-18 DIAGNOSIS — I10 Essential (primary) hypertension: Secondary | ICD-10-CM

## 2014-10-18 DIAGNOSIS — M069 Rheumatoid arthritis, unspecified: Secondary | ICD-10-CM

## 2014-10-18 NOTE — Assessment & Plan Note (Addendum)
Intolerant to statins. Continue zetia. Not fasting today.  Return for fasting labs tomorrow.

## 2014-10-18 NOTE — Assessment & Plan Note (Signed)
No records received. Continue plaquenil.

## 2014-10-18 NOTE — Assessment & Plan Note (Signed)
Preventative protocols reviewed and updated unless pt declined. Discussed healthy diet and lifestyle.  

## 2014-10-18 NOTE — Assessment & Plan Note (Signed)
Check labs today. Saw Dr Harden Mo 12/2013 with normal SPEP/UPEP and renal US.

## 2014-10-18 NOTE — Assessment & Plan Note (Signed)
Chronic, stable. Continue current regimen of amlodipine, lasix, Toprol XL.

## 2014-10-18 NOTE — Assessment & Plan Note (Signed)
Upcoming appt with cards Stanford Breed).

## 2014-10-18 NOTE — Assessment & Plan Note (Signed)
Continue klonopin 1mg  TID and lexapro.

## 2014-10-18 NOTE — Assessment & Plan Note (Signed)
Klonopin, lexapro, wellbutrin, ambien CR prescribed by Dr Harrington Challenger (psych)

## 2014-10-18 NOTE — Progress Notes (Signed)
Pre visit review using our clinic review tool, if applicable. No additional management support is needed unless otherwise documented below in the visit note. 

## 2014-10-18 NOTE — Progress Notes (Signed)
BP 110/60 mmHg  Pulse 64  Temp(Src) 97.7 F (36.5 C) (Oral)  Ht 5\' 2"  (1.575 m)  Wt 152 lb 4 oz (69.06 kg)  BMI 27.84 kg/m2   CC: CPE  Subjective:    Patient ID: Alexandria Leach, female    DOB: 21-Dec-1950, 64 y.o.   MRN: [id number]  HPI: Alexandria Leach is a 64 y.o. female presenting on 10/18/2014 for Annual Exam   Established with [place] 07/2014. No records received yet from prior PCP. H/o HTN, HLD, GERD, GAD, CKD ([place]), RA/sjogren syndrome and fibromyalgia. Sees Dr Estanislado Pandy on plaquenil 300mg  daily, gets regular eye exams. Also with h/o mitral valve repair with h/o CHF.   H/o several DVTs and PE x2. Receives coumadin when hospitalized but not outpatient regularly. On aspirin 325mg  daily. Receives SBE ppx.  Known lung nodule followed by Dr Cyndia Bent.   Hearing screen - passed Vision screen - at eye center 05/2014 Fall risk screen - fell last week at parking lot, scraped knees. Noted imbalance when over last 4-5 months. Rare tramadol use.  Depression screen - PHQ9 = 9. "so-so" mood. Decreased activity recently.  Preventative: Colon cancer screening - colonoscopy 07/2014 WNL Amedeo Plenty) Mammogram normal 06/2014. Does breast exams at home without concerns.  Well woman - s/p hysterectomy for fibroids. Ovaries remain. Latest pap normal 2014. DEXA scan - states this was done and normal, 08/2012 Flu shot - doesn't receive. Tetanus shot - unsure date.  Pneumovax 2014. Shingles shot - not interested  Advanced directive discussion - Does not have. Packet provided today. Possibly [name] son would be HCPOA. Seat belt use discussed Sunscreen use and skin screen discussed   Lives with husband and son, 1 dog Occupation: unemployed, on disability for fibromyalgia sine 2008. Edu: HS Religion: Jehova's witness Activity: volunteers at senior center Diet: some water, fruits/vegetables daily  Relevant past medical, surgical, family and social history reviewed and updated as indicated. Interim  medical history since our last visit reviewed. Allergies and medications reviewed and updated. Current Outpatient Prescriptions on File Prior to Visit  Medication Sig  . amLODipine (NORVASC) 10 MG tablet Take 1 tablet (10 mg total) by mouth daily.  Marland Kitchen amoxicillin (AMOXIL) 500 MG capsule Take 4 capsules (2,000 mg total) by mouth once. FOR DENTAL PROCEDURE  . antiseptic oral rinse (BIOTENE) LIQD 15 mLs by Mouth Rinse route as needed (Oral hygiene).   Marland Kitchen aspirin 325 MG EC tablet Take 325 mg by mouth every morning.   Marland Kitchen buPROPion (WELLBUTRIN XL) 300 MG 24 hr tablet Take 1 tablet (300 mg total) by mouth every morning.  . cholecalciferol (VITAMIN D) 1000 UNITS tablet Take 1,000 Units by mouth every morning.   . clonazePAM (KLONOPIN) 1 MG tablet Take 1 tablet (1 mg total) by mouth 3 (three) times daily as needed for anxiety.  . cycloSPORINE (RESTASIS) 0.05 % ophthalmic emulsion Place 1 drop into both eyes 2 (two) times daily.  Marland Kitchen escitalopram (LEXAPRO) 20 MG tablet Take 1 tablet (20 mg total) by mouth daily.  . famotidine (PEPCID) 20 MG tablet Take 1 tablet (20 mg total) by mouth 2 (two) times daily.  . furosemide (LASIX) 20 MG tablet Take 1 tablet (20 mg total) by mouth daily as needed for edema.  . gabapentin (NEURONTIN) 300 MG capsule Take 1 tablet three times a day  . hydroxychloroquine (PLAQUENIL) 200 MG tablet Take 1 tablet (200 mg total) by mouth daily. (Patient taking differently: Take 200 mg by mouth 2 (two) times daily. )  .  metoprolol succinate (TOPROL-XL) 50 MG 24 hr tablet Take 1/2 tablet in the morning and 1 tablet at night time  . Polyethyl Glycol-Propyl Glycol (SYSTANE) 0.4-0.3 % GEL Apply 1 drop to eye 2 (two) times daily.   Marland Kitchen tiZANidine (ZANAFLEX) 4 MG tablet Take 1 tablet (4 mg total) by mouth every 8 (eight) hours as needed for muscle spasms.  . traMADol (ULTRAM) 50 MG tablet Take 1 tablet (50 mg total) by mouth every 12 (twelve) hours as needed for severe pain.  Marland Kitchen VOLTAREN 1 % GEL  Apply 2 g topically as needed (FOR PAIN).   Marland Kitchen ZETIA 10 MG tablet Take 10 mg by mouth daily.   Marland Kitchen zolpidem (AMBIEN CR) 12.5 MG CR tablet Take 1 tablet (12.5 mg total) by mouth at bedtime as needed for sleep.   No current facility-administered medications on file prior to visit.    Review of Systems  Constitutional: Negative for fever, chills, activity change, appetite change, fatigue and unexpected weight change.  HENT: Negative for hearing loss.   Eyes: Negative for visual disturbance.  Respiratory: Positive for cough. Negative for chest tightness, shortness of breath and wheezing.   Cardiovascular: Negative for chest pain, palpitations and leg swelling.  Gastrointestinal: Negative for nausea, vomiting, abdominal pain, diarrhea, constipation, blood in stool and abdominal distention.  Genitourinary: Negative for hematuria and difficulty urinating.  Musculoskeletal: Negative for myalgias, arthralgias and neck pain.  Skin: Negative for rash.  Neurological: Negative for dizziness, seizures, syncope and headaches.  Hematological: Negative for adenopathy. Bruises/bleeds easily.  Psychiatric/Behavioral: Positive for dysphoric mood. The patient is nervous/anxious.    Per HPI unless specifically indicated above     Objective:    BP 110/60 mmHg  Pulse 64  Temp(Src) 97.7 F (36.5 C) (Oral)  Ht 5\' 2"  (1.575 m)  Wt 152 lb 4 oz (69.06 kg)  BMI 27.84 kg/m2  Wt Readings from Last 3 Encounters:  10/18/14 152 lb 4 oz (69.06 kg)  10/10/14 154 lb (69.854 kg)  09/20/14 154 lb (69.854 kg)    Physical Exam  Constitutional: She is oriented to person, place, and time. She appears well-developed and well-nourished. No distress.  HENT:  Head: Normocephalic and atraumatic.  Right Ear: Hearing, tympanic membrane, external ear and ear canal normal.  Left Ear: Hearing, tympanic membrane, external ear and ear canal normal.  Nose: Nose normal.  Mouth/Throat: Uvula is midline, oropharynx is clear and moist  and mucous membranes are normal. No oropharyngeal exudate, posterior oropharyngeal edema or posterior oropharyngeal erythema.  Eyes: Conjunctivae and EOM are normal. Pupils are equal, round, and reactive to light. No scleral icterus.  Neck: Normal range of motion. Neck supple. Carotid bruit is not present. No thyromegaly present.  Cardiovascular: Normal rate, regular rhythm, normal heart sounds and intact distal pulses.   No murmur heard. Pulses:      Radial pulses are 2+ on the right side, and 2+ on the left side.  Pulmonary/Chest: Effort normal and breath sounds normal. No respiratory distress. She has no wheezes. She has no rales. Right breast exhibits no inverted nipple, no mass, no nipple discharge, no skin change and no tenderness. Left breast exhibits no inverted nipple, no mass, no nipple discharge, no skin change and no tenderness.  Midline chest incision  Abdominal: Soft. Bowel sounds are normal. She exhibits no distension and no mass. There is no tenderness. There is no rebound and no guarding.  Genitourinary:  GYN - deferred  Musculoskeletal: Normal range of motion. She exhibits no  edema.  Lymphadenopathy:       Head (right side): No submental, no submandibular, no tonsillar, no preauricular and no posterior auricular adenopathy present.       Head (left side): No submental, no submandibular, no tonsillar, no preauricular and no posterior auricular adenopathy present.    She has no cervical adenopathy.    She has no axillary adenopathy.       Right axillary: No lateral adenopathy present.       Left axillary: No lateral adenopathy present.      Right: No supraclavicular adenopathy present.       Left: No supraclavicular adenopathy present.  Neurological: She is alert and oriented to person, place, and time.  CN grossly intact, station and gait intact Recall 3/3 Calculation unable to do serial 3s. Spelling 4/5 [id number]  Skin: Skin is warm and dry. No rash noted.  Psychiatric:  She has a normal mood and affect. Her behavior is normal. Judgment and thought content normal.  Slightly slowed mentation/movement noted today.  Nursing note and vitals reviewed.  Results for orders placed or performed in visit on 10/18/14  HM DEXA SCAN  Result Value Ref Range   HM Dexa Scan normal per patient       Assessment & Plan:   Problem List Items Addressed This Visit    Advanced care planning/counseling discussion    Advanced directive discussion - Does not have. Packet provided today. Possibly [name] son would be HCPOA.      Chronic insomnia    Suggested trial of lower ambien CR dose ,but to check with psych first. Pt feels she may sleep just as well with lower dose.      Contusion of knee    Slowly improving.      Diastolic CHF    Upcoming appt with cards Stanford Breed).      Relevant Orders   TSH   Essential hypertension    Chronic, stable. Continue current regimen of amlodipine, lasix, Toprol XL.      Relevant Orders   Comprehensive metabolic panel   GAD (generalized anxiety disorder)    Continue klonopin 1mg  TID and lexapro.      Health maintenance examination    Preventative protocols reviewed and updated unless pt declined. Discussed healthy diet and lifestyle.       HYPERCHOLESTEROLEMIA    Intolerant to statins. Continue zetia. Not fasting today.  Return for fasting labs tomorrow.      Relevant Orders   Lipid panel   Comprehensive metabolic panel   Kidney disease    Check labs today. Saw Dr Harden Mo 12/2013 with normal SPEP/UPEP and renal US.      Relevant Orders   Comprehensive metabolic panel   CBC with Differential/Platelet   MDD (major depressive disorder), recurrent episode    Klonopin, lexapro, wellbutrin, ambien CR prescribed by Dr Harrington Challenger (psych)      Medicare annual wellness visit, initial - Primary    I have personally reviewed the Medicare Annual Wellness questionnaire and have noted 1. The patient's medical and social  history 2. Their use of alcohol, tobacco or illicit drugs 3. Their current medications and supplements 4. The patient's functional ability including ADL's, fall risks, home safety risks and hearing or visual impairment. Cognitive function has been assessed and addressed as indicated.  5. Diet and physical activity 6. Evidence for depression or mood disorders The patients weight, height, BMI have been recorded in the chart. I have made referrals, counseling and provided education to  the patient based on review of the above and I have provided the pt with a written personalized care plan for preventive services. Provider list updated.. See scanned questionairre as needed for further documentation. Reviewed preventative protocols and updated unless pt declined.       Rheumatoid arthritis    No records received. Continue plaquenil.          Follow up plan: Return in about 4 months (around 02/17/2015), or as needed, for follow up visit.

## 2014-10-18 NOTE — Assessment & Plan Note (Signed)

## 2014-10-18 NOTE — Assessment & Plan Note (Signed)
Suggested trial of lower ambien CR dose ,but to check with psych first. Pt feels she may sleep just as well with lower dose.

## 2014-10-18 NOTE — Assessment & Plan Note (Signed)
Slowly improving

## 2014-10-18 NOTE — Assessment & Plan Note (Signed)
Advanced directive discussion - Does not have. Packet provided today. Possibly [name] son would be HCPOA.

## 2014-10-18 NOTE — Patient Instructions (Addendum)
Sign release form up front for records from Dr Maudie Mercury and Dr Estanislado Pandy. Decrease gabapentin to 300mg  twice daily.  Consider trying lower ambien CR dose for sleep to see if just as effective (check with Dr Harrington Challenger). Advanced directive handout provided today. Labwork tomorrow fasting. Return as needed or in 3-4 months for follow up

## 2014-10-19 ENCOUNTER — Other Ambulatory Visit (INDEPENDENT_AMBULATORY_CARE_PROVIDER_SITE_OTHER): Payer: Medicare Other

## 2014-10-19 DIAGNOSIS — I5032 Chronic diastolic (congestive) heart failure: Secondary | ICD-10-CM

## 2014-10-19 DIAGNOSIS — N289 Disorder of kidney and ureter, unspecified: Secondary | ICD-10-CM | POA: Diagnosis not present

## 2014-10-19 DIAGNOSIS — E7800 Pure hypercholesterolemia, unspecified: Secondary | ICD-10-CM

## 2014-10-19 DIAGNOSIS — I10 Essential (primary) hypertension: Secondary | ICD-10-CM

## 2014-10-19 LAB — LIPID PANEL
Cholesterol: 242 mg/dL — ABNORMAL HIGH (ref 0–200)
HDL: 80.1 mg/dL (ref >39.00)
LDL Cholesterol: 147 mg/dL — ABNORMAL HIGH (ref 0–99)
NonHDL: 161.79
Total CHOL/HDL Ratio: 3
Triglycerides: 72 mg/dL (ref 0.0–149.0)
VLDL: 14.4 mg/dL (ref 0.0–40.0)

## 2014-10-19 LAB — COMPREHENSIVE METABOLIC PANEL
ALBUMIN: 4.3 g/dL (ref 3.5–5.2)
ALK PHOS: 87 U/L (ref 39–117)
ALT: 31 U/L (ref 0–35)
AST: 35 U/L (ref 0–37)
BUN: 11 mg/dL (ref 6–23)
CHLORIDE: 108 meq/L (ref 96–112)
CO2: 26 meq/L (ref 19–32)
Calcium: 10 mg/dL (ref 8.4–10.5)
Creatinine, Ser: 1.27 mg/dL — ABNORMAL HIGH (ref 0.40–1.20)
GFR: 54.79 mL/min — ABNORMAL LOW (ref >60.00)
Glucose, Bld: 79 mg/dL (ref 70–99)
Potassium: 3.9 mEq/L (ref 3.5–5.1)
Sodium: 141 mEq/L (ref 135–145)
Total Bilirubin: 0.4 mg/dL (ref 0.2–1.2)
Total Protein: 7.8 g/dL (ref 6.0–8.3)

## 2014-10-19 LAB — CBC WITH DIFFERENTIAL/PLATELET
BASOS ABS: 0 10*3/uL (ref 0.0–0.1)
Basophils Relative: 0.6 % (ref 0.0–3.0)
Eosinophils Absolute: 0.1 10*3/uL (ref 0.0–0.7)
Eosinophils Relative: 2 % (ref 0.0–5.0)
HCT: 38.9 % (ref 36.0–46.0)
Hemoglobin: 12.6 g/dL (ref 12.0–15.0)
Lymphocytes Relative: 39.8 % (ref 12.0–46.0)
Lymphs Abs: 1.5 10*3/uL (ref 0.7–4.0)
MCHC: 32.3 g/dL (ref 30.0–36.0)
MCV: 84 fl (ref 78.0–100.0)
Monocytes Absolute: 0.4 10*3/uL (ref 0.1–1.0)
Monocytes Relative: 9.7 % (ref 3.0–12.0)
NEUTROS ABS: 1.8 10*3/uL (ref 1.4–7.7)
Neutrophils Relative %: 47.9 % (ref 43.0–77.0)
PLATELETS: 233 10*3/uL (ref 150.0–400.0)
RBC: 4.63 Mil/uL (ref 3.87–5.11)
RDW: 14.2 % (ref 11.5–15.5)
WBC: 3.7 10*3/uL — AB (ref 4.0–10.5)

## 2014-10-19 LAB — TSH: TSH: 1.52 u[IU]/mL (ref 0.35–4.50)

## 2014-10-19 NOTE — Progress Notes (Signed)
HPI: FU mitral valve repair secondary to rheumatic heart disease. Patient is status post mitral valve repair in 1980. Operative report not available. Nuclear study 11/14 showed EF 66 and normal perfusion. Holter 11/14 showed sinus with pacs, pvcs and brief PAT. Echo 8/15 showed normal LV function, grade 2 diastolic dysfunction, elevated LV filling pressure, mild AI, moderate MS, mild MR, moderate LAE. Since last seen, there is no dyspnea, chest pain, palpitations or syncope.  Current Outpatient Prescriptions  Medication Sig Dispense Refill  . amLODipine (NORVASC) 10 MG tablet Take 1 tablet (10 mg total) by mouth daily. 90 tablet 1  . amoxicillin (AMOXIL) 500 MG capsule Take 4 capsules (2,000 mg total) by mouth once. FOR DENTAL PROCEDURE 4 capsule 2  . antiseptic oral rinse (BIOTENE) LIQD 15 mLs by Mouth Rinse route as needed (Oral hygiene).     Marland Kitchen aspirin 325 MG EC tablet Take 325 mg by mouth every morning.     Marland Kitchen buPROPion (WELLBUTRIN XL) 300 MG 24 hr tablet Take 1 tablet (300 mg total) by mouth every morning. 90 tablet 2  . cholecalciferol (VITAMIN D) 1000 UNITS tablet Take 1,000 Units by mouth every morning.     . clonazePAM (KLONOPIN) 1 MG tablet Take 1 tablet (1 mg total) by mouth 3 (three) times daily as needed for anxiety. 270 tablet 2  . cycloSPORINE (RESTASIS) 0.05 % ophthalmic emulsion Place 1 drop into both eyes 2 (two) times daily. 3 each 2  . escitalopram (LEXAPRO) 20 MG tablet Take 1 tablet (20 mg total) by mouth daily. 90 tablet 2  . famotidine (PEPCID) 20 MG tablet Take 1 tablet (20 mg total) by mouth 2 (two) times daily. 180 tablet 1  . furosemide (LASIX) 20 MG tablet Take 1 tablet (20 mg total) by mouth daily as needed for edema. 90 tablet 1  . gabapentin (NEURONTIN) 300 MG capsule Take 1 tablet three times a day 270 capsule 2  . hydroxychloroquine (PLAQUENIL) 200 MG tablet Take 1 tablet (200 mg total) by mouth daily. (Patient taking differently: Take 200 mg by mouth 2 (two)  times daily. ) 90 tablet 1  . metoprolol succinate (TOPROL-XL) 50 MG 24 hr tablet Take 1/2 tablet in the morning and 1 tablet at night time 135 tablet 2  . Polyethyl Glycol-Propyl Glycol (SYSTANE) 0.4-0.3 % GEL Apply 1 drop to eye 2 (two) times daily.     Marland Kitchen tiZANidine (ZANAFLEX) 4 MG tablet Take 1 tablet (4 mg total) by mouth every 8 (eight) hours as needed for muscle spasms. 30 tablet 3  . traMADol (ULTRAM) 50 MG tablet Take 1 tablet (50 mg total) by mouth every 12 (twelve) hours as needed for severe pain. 30 tablet 3  . VOLTAREN 1 % GEL Apply 2 g topically as needed (FOR PAIN).   3  . ZETIA 10 MG tablet Take 10 mg by mouth daily.   13  . zolpidem (AMBIEN CR) 12.5 MG CR tablet Take 1 tablet (12.5 mg total) by mouth at bedtime as needed for sleep. 90 tablet 2   No current facility-administered medications for this visit.     Past Medical History  Diagnosis Date  . Depression   . HTN (hypertension)   . Rheumatic heart disease 1980    s/p mitral valve repair 1980  . HLD (hyperlipidemia)   . Insomnia   . Anxiety   . History of DVT (deep vein thrombosis) several times latest 2012    receives coumadin while  hospitalized  . History of pulmonary embolism 2001, 2006    completed coumadin courses  . Fibromyalgia   . Connective tissue disease   . Rheumatoid arthritis     vs osteoarthritis?  . Personal history of urinary calculi latest 2014  . PONV (postoperative nausea and vomiting)   . Refusal of blood transfusions as patient is Jehovah's Witness   . Nodule of right lung     RLL 67mm 09/23/12  . Sjogren's syndrome   . CHF (congestive heart failure)   . Glaucoma     s/p surgery, sees ophtho Q6 mo  . Kidney disease     saw nephrologist Dr Harden Mo  . History of rheumatic fever x3    Past Surgical History  Procedure Laterality Date  . Mitral valve repair  1980    open heart  . Tubal ligation  1980  . Vaginal hysterectomy  1992    for fibroids -- partial, ovaries remain  .  Cholecystectomy  11/27/2011    Procedure: LAPAROSCOPIC CHOLECYSTECTOMY WITH INTRAOPERATIVE CHOLANGIOGRAM;  Surgeon: Adin Hector, MD;  Location: [facility];  Service: General;  Laterality: N/A;  laparoscopic cholecystectomy with choleangiogram umbilical hernia repair  . Umbilical hernia repair  11/27/2011    Procedure: HERNIA REPAIR UMBILICAL ADULT;  Surgeon: Adin Hector, MD;  Location: [facility];  Service: General;  Laterality: N/A;  . Breast biopsy Right 2006    benign  . Colonoscopy  07/2014    WNL Amedeo Plenty)    History   Social History  . Marital Status: Married    Spouse Name: N/A  . Number of Children: 4  . Years of Education: N/A   Occupational History  .      Disability   Social History Main Topics  . Smoking status: Never Smoker   . Smokeless tobacco: Never Used  . Alcohol Use: No  . Drug Use: No  . Sexual Activity: Not Currently    Birth Control/ Protection: Surgical   Other Topics Concern  . Not on file   Social History Narrative   Lives with husband and son, 1 dog   Occupation: unemployed, on disability for fibromyalgia sine 2008.   Edu: HS   Religion: Jehova's witness   Activity: volunteers at senior center   Diet: some water, fruits/vegetables daily    ROS: some unsteadiness and imbalance with ambulation butno fevers or chills, productive cough, hemoptysis, dysphasia, odynophagia, melena, hematochezia, dysuria, hematuria, rash, seizure activity, orthopnea, PND, pedal edema, claudication. Remaining systems are negative.  Physical Exam: Well-developed well-nourished in no acute distress.  Skin is warm and dry.  HEENT is normal.  Neck is supple.  Chest is clear to auscultation with normal expansion.  Cardiovascular exam is regular rate and rhythm. 2/6 systolic murmur left sternal border. Abdominal exam nontender or distended. No masses palpated. Extremities show no edema. neuro grossly intact  ECG sinus rhythm at a rate of 69. Anterior T-wave  inversion.

## 2014-10-21 ENCOUNTER — Ambulatory Visit (INDEPENDENT_AMBULATORY_CARE_PROVIDER_SITE_OTHER): Payer: Medicare Other | Admitting: Cardiology

## 2014-10-21 ENCOUNTER — Encounter: Payer: Self-pay | Admitting: Cardiology

## 2014-10-21 ENCOUNTER — Encounter: Payer: Self-pay | Admitting: *Deleted

## 2014-10-21 VITALS — BP 108/62 | HR 69 | Ht 61.0 in | Wt 150.6 lb

## 2014-10-21 DIAGNOSIS — R002 Palpitations: Secondary | ICD-10-CM

## 2014-10-21 DIAGNOSIS — I10 Essential (primary) hypertension: Secondary | ICD-10-CM | POA: Diagnosis not present

## 2014-10-21 DIAGNOSIS — E7800 Pure hypercholesterolemia, unspecified: Secondary | ICD-10-CM

## 2014-10-21 DIAGNOSIS — Z9889 Other specified postprocedural states: Secondary | ICD-10-CM | POA: Diagnosis not present

## 2014-10-21 DIAGNOSIS — I350 Nonrheumatic aortic (valve) stenosis: Secondary | ICD-10-CM | POA: Diagnosis not present

## 2014-10-21 DIAGNOSIS — I5032 Chronic diastolic (congestive) heart failure: Secondary | ICD-10-CM

## 2014-10-21 NOTE — Assessment & Plan Note (Signed)
Blood pressure controlled. Continue present medications. 

## 2014-10-21 NOTE — Assessment & Plan Note (Signed)
Continue present dose of Lasix. Euvolemic on examination. 

## 2014-10-21 NOTE — Assessment & Plan Note (Signed)
Continue beta blocker. 

## 2014-10-21 NOTE — Assessment & Plan Note (Signed)
Status post mitral valve repair and history of moderate mitral stenosis/mild aortic insufficiency. Repeat echocardiogram. Continue SBE prophylaxis.

## 2014-10-21 NOTE — Patient Instructions (Signed)

## 2014-10-21 NOTE — Assessment & Plan Note (Signed)
Management per primary care. 

## 2014-10-26 ENCOUNTER — Encounter: Payer: Self-pay | Admitting: Family Medicine

## 2014-10-26 DIAGNOSIS — M5030 Other cervical disc degeneration, unspecified cervical region: Secondary | ICD-10-CM | POA: Insufficient documentation

## 2014-11-01 ENCOUNTER — Ambulatory Visit (HOSPITAL_COMMUNITY): Payer: Self-pay | Admitting: Psychiatry

## 2014-11-03 ENCOUNTER — Other Ambulatory Visit: Payer: Self-pay

## 2014-11-03 ENCOUNTER — Ambulatory Visit (HOSPITAL_COMMUNITY): Payer: Medicare Other | Attending: Cardiology

## 2014-11-03 DIAGNOSIS — I350 Nonrheumatic aortic (valve) stenosis: Secondary | ICD-10-CM | POA: Diagnosis not present

## 2014-11-03 DIAGNOSIS — I10 Essential (primary) hypertension: Secondary | ICD-10-CM | POA: Diagnosis not present

## 2014-11-03 DIAGNOSIS — I517 Cardiomegaly: Secondary | ICD-10-CM | POA: Insufficient documentation

## 2014-11-03 DIAGNOSIS — E785 Hyperlipidemia, unspecified: Secondary | ICD-10-CM | POA: Insufficient documentation

## 2014-11-03 DIAGNOSIS — I352 Nonrheumatic aortic (valve) stenosis with insufficiency: Secondary | ICD-10-CM | POA: Diagnosis not present

## 2014-11-03 DIAGNOSIS — I340 Nonrheumatic mitral (valve) insufficiency: Secondary | ICD-10-CM | POA: Diagnosis not present

## 2014-11-05 ENCOUNTER — Other Ambulatory Visit: Payer: Self-pay | Admitting: Family Medicine

## 2014-11-08 ENCOUNTER — Other Ambulatory Visit: Payer: Self-pay | Admitting: Surgery

## 2014-11-08 DIAGNOSIS — R911 Solitary pulmonary nodule: Secondary | ICD-10-CM

## 2014-11-11 ENCOUNTER — Ambulatory Visit (INDEPENDENT_AMBULATORY_CARE_PROVIDER_SITE_OTHER): Payer: Medicare Other | Admitting: Psychiatry

## 2014-11-11 ENCOUNTER — Encounter (HOSPITAL_COMMUNITY): Payer: Self-pay | Admitting: Psychiatry

## 2014-11-11 VITALS — BP 120/75 | HR 69 | Ht 61.0 in | Wt 150.2 lb

## 2014-11-11 DIAGNOSIS — F331 Major depressive disorder, recurrent, moderate: Secondary | ICD-10-CM

## 2014-11-11 MED ORDER — ESCITALOPRAM OXALATE 20 MG PO TABS: 20.0000 mg | ORAL_TABLET | Freq: Every day | ORAL | Status: DC

## 2014-11-11 MED ORDER — BUPROPION HCL ER (XL) 300 MG PO TB24: 300.0000 mg | ORAL_TABLET | ORAL | Status: DC
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
## 2014-11-14 DIAGNOSIS — M25562 Pain in left knee: Secondary | ICD-10-CM | POA: Diagnosis not present

## 2014-11-14 DIAGNOSIS — M797 Fibromyalgia: Secondary | ICD-10-CM | POA: Diagnosis not present

## 2014-11-14 DIAGNOSIS — M5030 Other cervical disc degeneration, unspecified cervical region: Secondary | ICD-10-CM | POA: Diagnosis not present

## 2014-11-14 DIAGNOSIS — M25561 Pain in right knee: Secondary | ICD-10-CM | POA: Diagnosis not present

## 2014-11-17 ENCOUNTER — Encounter: Payer: Self-pay | Admitting: *Deleted

## 2014-11-18 ENCOUNTER — Telehealth: Payer: Self-pay | Admitting: Diagnostic Neuroimaging

## 2014-11-18 ENCOUNTER — Ambulatory Visit (INDEPENDENT_AMBULATORY_CARE_PROVIDER_SITE_OTHER): Payer: Medicare Other | Admitting: Diagnostic Neuroimaging

## 2014-11-18 ENCOUNTER — Encounter: Payer: Self-pay | Admitting: Diagnostic Neuroimaging

## 2014-11-18 VITALS — BP 130/81 | HR 69 | Ht 61.0 in | Wt 150.2 lb

## 2014-11-18 DIAGNOSIS — M6289 Other specified disorders of muscle: Secondary | ICD-10-CM

## 2014-11-18 DIAGNOSIS — M3500 Sicca syndrome, unspecified: Secondary | ICD-10-CM

## 2014-11-18 DIAGNOSIS — R200 Anesthesia of skin: Secondary | ICD-10-CM

## 2014-11-18 DIAGNOSIS — R531 Weakness: Secondary | ICD-10-CM

## 2014-11-18 DIAGNOSIS — R269 Unspecified abnormalities of gait and mobility: Secondary | ICD-10-CM | POA: Diagnosis not present

## 2014-11-18 NOTE — Progress Notes (Signed)
[facility]  PATIENT: Alexandria Leach DOB: Nov 14, 1950  REFERRING CLINICIAN: Abel Presto Deveshwar HISTORY FROM: patient  REASON FOR VISIT: new consult    HISTORICAL  CHIEF COMPLAINT:  Chief Complaint  Patient presents with  . Imbalance    rm 6, New Patient    HISTORY OF PRESENT ILLNESS:   64 year old left-handed female with rheumatoid arthritis, fibromyalgia, Sjogren's syndrome, rheumatic heart disease, hypertension, depression, anxiety, here for evaluation of balance and gait difficulty.  For past 1 year patient has had increasing balance and walking problems. She has fallen down 6 times. She also feels right-sided weakness and numbness. When patient stands up she feels okay, but when she starts to walk she feels like her legs do not do what she tells him to do.  Patient has significant chronic pain in her neck, low back, arms and legs. She also has intermittent dizziness.   REVIEW OF SYSTEMS: Full 14 system review of systems performed and notable only for fatigue palpitations murmurs trouble swallowing loss of vision easy bruising joint pains aching muscle depression anxiety numbness weakness difficulty swallowing dizziness.  ALLERGIES: Allergies  Allergen Reactions  . Cymbalta [Duloxetine Hcl] Other (See Comments)    tachycardia  . Statins Nausea Only and Other (See Comments)    Muscle cramps also  . Sulfa Antibiotics Nausea And Vomiting    HOME MEDICATIONS: Outpatient Prescriptions Prior to Visit  Medication Sig Dispense Refill  . amLODipine (NORVASC) 10 MG tablet Take 1 tablet (10 mg total) by mouth daily. 90 tablet 1  . amoxicillin (AMOXIL) 500 MG capsule Take 4 capsules (2,000 mg total) by mouth once. FOR DENTAL PROCEDURE 4 capsule 2  . antiseptic oral rinse (BIOTENE) LIQD 15 mLs by Mouth Rinse route as needed (Oral hygiene).     Marland Kitchen aspirin 325 MG EC tablet Take 325 mg by mouth every morning.     Marland Kitchen buPROPion (WELLBUTRIN XL) 300 MG 24 hr tablet  Take 1 tablet (300 mg total) by mouth every morning. 90 tablet 2  . cholecalciferol (VITAMIN D) 1000 UNITS tablet Take 1,000 Units by mouth every morning.     . clonazePAM (KLONOPIN) 1 MG tablet Take 1 tablet (1 mg total) by mouth 3 (three) times daily as needed for anxiety. 270 tablet 2  . cycloSPORINE (RESTASIS) 0.05 % ophthalmic emulsion Place 1 drop into both eyes 2 (two) times daily. 3 each 2  . escitalopram (LEXAPRO) 20 MG tablet Take 1 tablet (20 mg total) by mouth daily. 90 tablet 2  . famotidine (PEPCID) 20 MG tablet Take 1 tablet (20 mg total) by mouth 2 (two) times daily. 180 tablet 1  . furosemide (LASIX) 20 MG tablet Take 1 tablet (20 mg total) by mouth daily as needed for edema. 90 tablet 1  . gabapentin (NEURONTIN) 300 MG capsule Take 1 tablet three times a day 270 capsule 2  . hydroxychloroquine (PLAQUENIL) 200 MG tablet Take 1 tablet (200 mg total) by mouth 2 (two) times daily.    . metoprolol succinate (TOPROL-XL) 50 MG 24 hr tablet Take 1/2 tablet in the morning and 1 tablet at night time 135 tablet 2  . pilocarpine (SALAGEN) 5 MG tablet Take 0.5-1 tablets (2.5-5 mg total) by mouth 3 (three) times daily.    Vladimir Faster Glycol-Propyl Glycol (SYSTANE) 0.4-0.3 % GEL Apply 1 drop to eye 2 (two) times daily.     Marland Kitchen tiZANidine (ZANAFLEX) 4 MG tablet Take 1 tablet (4 mg total) by mouth every 8 (  eight) hours as needed for muscle spasms. 30 tablet 3  . traMADol (ULTRAM) 50 MG tablet Take 1 tablet (50 mg total) by mouth every 12 (twelve) hours as needed for severe pain. 30 tablet 3  . VOLTAREN 1 % GEL Apply 2 g topically as needed (FOR PAIN).   3  . ZETIA 10 MG tablet Take 10 mg by mouth daily.   13  . zolpidem (AMBIEN CR) 12.5 MG CR tablet Take 1 tablet (12.5 mg total) by mouth at bedtime as needed for sleep. 90 tablet 2   No facility-administered medications prior to visit.    PAST MEDICAL HISTORY: Past Medical History  Diagnosis Date  . Depression   . HTN (hypertension)   .  Rheumatic heart disease 1980    s/p mitral valve repair 1980  . HLD (hyperlipidemia)   . Insomnia   . Anxiety   . History of DVT (deep vein thrombosis) several times latest 2012    receives coumadin while hospitalized  . History of pulmonary embolism 2001, 2006    completed coumadin courses  . Fibromyalgia   . Osteoarthritis     shoulders and knees, not RA per Dr Estanislado Pandy, positive ANA, positive Ro  . Personal history of urinary calculi latest 2014  . PONV (postoperative nausea and vomiting)   . Refusal of blood transfusions as patient is Jehovah's Witness   . Nodule of right lung     RLL 55mm 09/23/12  . Sjogren's syndrome   . CHF (congestive heart failure)   . Glaucoma     s/p surgery, sees ophtho Q6 mo  . Kidney disease     saw nephrologist Dr Harden Mo  . History of rheumatic fever x3  . DDD (degenerative disc disease), cervical     PAST SURGICAL HISTORY: Past Surgical History  Procedure Laterality Date  . Mitral valve repair  1980    open heart  . Tubal ligation  1980  . Vaginal hysterectomy  1992    for fibroids -- partial, ovaries remain  . Cholecystectomy  11/27/2011    Procedure: LAPAROSCOPIC CHOLECYSTECTOMY WITH INTRAOPERATIVE CHOLANGIOGRAM;  Surgeon: Adin Hector, MD;  Location: [facility];  Service: General;  Laterality: N/A;  laparoscopic cholecystectomy with choleangiogram umbilical hernia repair  . Umbilical hernia repair  11/27/2011    Procedure: HERNIA REPAIR UMBILICAL ADULT;  Surgeon: Adin Hector, MD;  Location: [facility];  Service: General;  Laterality: N/A;  . Breast biopsy Right 2006    benign  . Colonoscopy  07/2014    WNL Amedeo Plenty)    FAMILY HISTORY: Family History  Problem Relation Age of Onset  . Lupus Sister     and niece  . Cancer Mother     lung (nonsmoker)  . CAD Mother     MI in her 79s  . ALS Mother   . Kidney disease Father   . Alcohol abuse Father   . Diabetes Father   . Cancer Brother     bone  . Cancer Maternal Uncle     bone  .  Depression Sister   . Kidney failure Other     on HD  . Diabetes Brother   . Diabetes Sister   . Stroke Maternal Grandmother   . Stroke Sister     SOCIAL HISTORY:  Social History   Social History  . Marital Status: Married    Spouse Name: [name]  . Number of Children: 4  . Years of Education: 12   Occupational History  .  Disability   Social History Main Topics  . Smoking status: Never Smoker   . Smokeless tobacco: Never Used  . Alcohol Use: No  . Drug Use: No  . Sexual Activity: Not Currently    Birth Control/ Protection: Surgical   Other Topics Concern  . Not on file   Social History Narrative   Lives with son, 1 dog   Occupation: unemployed, on disability for fibromyalgia since 2008.   Edu: HS   Religion: Jehova's witness   Activity: volunteers at senior center   Diet: some water, fruits/vegetables daily   No caffeine use     PHYSICAL EXAM  GENERAL EXAM/CONSTITUTIONAL: Vitals:  Filed Vitals:   11/18/14 1055  BP: 130/81  Pulse: 69  Height: 5\' 1"  (1.549 m)  Weight: 150 lb 3.2 oz (68.13 kg)     Body mass index is 28.39 kg/(m^2).  Visual Acuity Screening   Right eye Left eye Both eyes  Without correction: 20/30 20/40   With correction:        Patient is in no distress; well developed, nourished and groomed; neck is supple   SOFT SPOKEN, HOARSE VOICE  CARDIOVASCULAR:  Examination of carotid arteries is normal; no carotid bruits  Regular rate and rhythm, WITH FAINT SYSTOLIC MURMUR  Examination of peripheral vascular system by observation and palpation is normal  EYES:  Ophthalmoscopic exam of optic discs and posterior segments is normal; no papilledema or hemorrhages  MUSCULOSKELETAL:  Gait, strength, tone, movements noted in Neurologic exam below  NEUROLOGIC: MENTAL STATUS:  No flowsheet data found.  awake, alert, oriented to person, place and time  recent and remote memory intact  normal attention and  concentration  language fluent, comprehension intact, naming intact,   fund of knowledge appropriate  CRANIAL NERVE:   2nd - no papilledema on fundoscopic exam  2nd, 3rd, 4th, 6th - pupils equal and reactive to light, visual fields full to confrontation, extraocular muscles intact, no nystagmus  5th - facial sensation symmetric  7th - facial strength symmetric  8th - hearing intact  9th - palate elevates symmetrically, uvula midline  11th - shoulder shrug symmetric  12th - tongue protrusion midline  MOTOR:   normal bulk and tone, full strength in the BUE; BLE LIMITED BY PAIN, 4+ PROX AND 5 DISTAL  SENSORY:   normal and symmetric to light touch, pinprick, temperature, vibration; EXCEPT DECR IN RIGHT HAND/ARM TO ALL MODALITIES  COORDINATION:   finger-nose-finger, fine finger movements normal  REFLEXES:   deep tendon reflexes 1+ and symmetric; EXCEPT ABSENT AT ANKLES  GAIT/STATION:   narrow based gait; VALGUS DEFORMITY OF LOWER EXT; STEPPAGE GAIT; CANNOT WALK TOE HEEL OR TANDEM; ROMBERG NEG     DIAGNOSTIC DATA (LABS, IMAGING, TESTING) - I reviewed patient records, labs, notes, testing and imaging myself where available.  Lab Results  Component Value Date   WBC 3.7* 10/19/2014   HGB 12.6 10/19/2014   HCT 38.9 10/19/2014   MCV 84.0 10/19/2014   PLT 233.0 10/19/2014      Component Value Date/Time   NA 141 10/19/2014 0945   K 3.9 10/19/2014 0945   CL 108 10/19/2014 0945   CO2 26 10/19/2014 0945   GLUCOSE 79 10/19/2014 0945   BUN 11 10/19/2014 0945   CREATININE 1.27* 10/19/2014 0945   CALCIUM 10.0 10/19/2014 0945   PROT 7.8 10/19/2014 0945   ALBUMIN 4.3 10/19/2014 0945   AST 35 10/19/2014 0945   ALT 31 10/19/2014 0945   ALKPHOS 87 10/19/2014  0945   BILITOT 0.4 10/19/2014 0945   GFRNONAA 47* 06/03/2014 1811   GFRAA 55* 06/03/2014 1811   Lab Results  Component Value Date   CHOL 242* 10/19/2014   HDL 80.10 10/19/2014   LDLCALC 147* 10/19/2014    TRIG 72.0 10/19/2014   CHOLHDL 3 10/19/2014   No results found for: HGBA1C No results found for: VITAMINB12 Lab Results  Component Value Date   TSH 1.52 10/19/2014    10/10/14 KNEE XRAY [I reviewed images myself and agree with interpretation. -VRP]  - Mild soft tissue swelling over the right patella. No fracture, subluxation or dislocation. No joint effusion. Mild degenerative changes with spurring in all 3 compartments and slight joint space narrowing in the medial compartment.  - No acute bony abnormality.    ASSESSMENT AND PLAN  64 y.o. year old female here with one year history of gait and balance difficulty. She is followed on 6 times. History and exam notable for sensory and motor deficits on the right side. Will check additional testing to rule out secondary causes. Patient also has significant chronic pain, chronic rheumatologic disease which may be contributory to her gait and balance difficulty.   Ddx: neuropathy (metabolic, sjogren's neuropathy, idiopathic), cervical radiculopathy, lumbar radiculopathy, CNS inflamm/autoimmune, chronic pain, arthritis   Numbness on right side - Plan: Neuropathy Panel, MR Brain W Wo Contrast, MR Cervical Spine W Wo Contrast  Right sided weakness - Plan: Neuropathy Panel, MR Brain W Wo Contrast, MR Cervical Spine W Wo Contrast  Gait difficulty - Plan: Neuropathy Panel, MR Brain W Wo Contrast, MR Cervical Spine W Wo Contrast  Sjogren's disease - Plan: Neuropathy Panel, MR Brain W Wo Contrast, MR Cervical Spine W Wo Contrast    PLAN:  Patient Instructions  I will check MRI brain and cervical spine.   I will check lab testing.  Follow up physical therapy referral.  Use cane.   Orders Placed This Encounter  Procedures  . MR Brain W Wo Contrast  . MR Cervical Spine W Wo Contrast  . Neuropathy Panel   Return in about 3 months (around 02/17/2015).    Penni Bombard, MD 05/17/5496, 26:41 AM Certified in Neurology,  Neurophysiology and Neuroimaging  [facility] [address] [place], [place] [address] [phone]

## 2014-11-18 NOTE — Patient Instructions (Signed)
I will check MRI brain and cervical spine.   I will check lab testing.  Follow up physical therapy referral.  Use cane.

## 2014-11-18 NOTE — Telephone Encounter (Signed)
Mri cervical spine order changed.

## 2014-11-22 LAB — NEUROPATHY PANEL
A/G RATIO SPE: 1.1 (ref 0.7–1.7)
ALBUMIN ELP: 4 g/dL (ref 2.9–4.4)
ALPHA 1: 0.2 g/dL (ref 0.0–0.4)
Alpha 2: 0.8 g/dL (ref 0.4–1.0)
Angio Convert Enzyme: 22 U/L (ref 14–82)
Anti Nuclear Antibody(ANA): POSITIVE — AB
BETA: 1.1 g/dL (ref 0.7–1.3)
GAMMA GLOBULIN: 1.4 g/dL (ref 0.4–1.8)
Globulin, Total: 3.5 g/dL (ref 2.2–3.9)
Rhuematoid fact SerPl-aCnc: <10 IU/mL (ref 0.0–13.9)
Sed Rate: 20 mm/hr (ref 0–40)
TSH: 1.86 u[IU]/mL (ref 0.450–4.500)
Total Protein: 7.5 g/dL (ref 6.0–8.5)
VITAMIN B 12: 898 pg/mL (ref 211–946)
Vit D, 25-Hydroxy: 43.7 ng/mL (ref 30.0–100.0)

## 2014-11-24 ENCOUNTER — Telehealth: Payer: Self-pay | Admitting: *Deleted

## 2014-11-24 NOTE — Telephone Encounter (Signed)
-----   Message from Penni Bombard, MD sent at 11/24/2014  9:25 AM EDT ----- Regarding: RE: Re: lab results Labs ok. ANA is positive, but we expected this based on her known sjogrens disease.    -VRP  ----- Message -----    From: Minna Antis, RN    Sent: 11/23/2014   5:31 PM      To: Penni Bombard, MD Subject: Re: lab results                                Dr Mamie Nick,  She has an abnormal autoimmune lab result.  I'll be glad to call her if you 'd like; just let me know how to inform her.  Thank you, [facility]

## 2014-11-24 NOTE — Telephone Encounter (Signed)
Per Dr Leta Baptist, left detailed vm re: his message below regarding her labs. Left this caller's name, number for further questions.

## 2014-12-02 ENCOUNTER — Ambulatory Visit
Admission: RE | Admit: 2014-12-02 | Discharge: 2014-12-02 | Disposition: A | Discharge status: Home / Self Care | Payer: Medicare Other | Source: Ambulatory Visit | Attending: Diagnostic Neuroimaging | Admitting: Diagnostic Neuroimaging

## 2014-12-02 DIAGNOSIS — R531 Weakness: Secondary | ICD-10-CM

## 2014-12-02 DIAGNOSIS — R200 Anesthesia of skin: Secondary | ICD-10-CM

## 2014-12-02 DIAGNOSIS — R269 Unspecified abnormalities of gait and mobility: Secondary | ICD-10-CM

## 2014-12-02 DIAGNOSIS — M3500 Sicca syndrome, unspecified: Secondary | ICD-10-CM

## 2014-12-07 ENCOUNTER — Other Ambulatory Visit: Payer: Self-pay | Admitting: Family Medicine

## 2014-12-09 ENCOUNTER — Ambulatory Visit
Admission: RE | Admit: 2014-12-09 | Discharge: 2014-12-09 | Disposition: A | Discharge status: Home / Self Care | Payer: Medicare Other | Source: Ambulatory Visit | Attending: Diagnostic Neuroimaging | Admitting: Diagnostic Neuroimaging

## 2014-12-09 DIAGNOSIS — R269 Unspecified abnormalities of gait and mobility: Secondary | ICD-10-CM | POA: Diagnosis not present

## 2014-12-09 DIAGNOSIS — M2689 Other dentofacial anomalies: Secondary | ICD-10-CM | POA: Diagnosis not present

## 2014-12-09 DIAGNOSIS — M6289 Other specified disorders of muscle: Secondary | ICD-10-CM | POA: Diagnosis not present

## 2014-12-09 DIAGNOSIS — R200 Anesthesia of skin: Secondary | ICD-10-CM | POA: Diagnosis not present

## 2014-12-09 MED ORDER — GADOBENATE DIMEGLUMINE 529 MG/ML IV SOLN: 14.0000 mL | Freq: Once | INTRAVENOUS | Status: DC | PRN

## 2014-12-12 ENCOUNTER — Other Ambulatory Visit: Payer: Medicare Other

## 2014-12-14 ENCOUNTER — Ambulatory Visit (INDEPENDENT_AMBULATORY_CARE_PROVIDER_SITE_OTHER): Payer: Medicare Other | Admitting: Surgery

## 2014-12-14 ENCOUNTER — Ambulatory Visit
Admission: RE | Admit: 2014-12-14 | Discharge: 2014-12-14 | Disposition: A | Discharge status: Home / Self Care | Payer: Medicare Other | Source: Ambulatory Visit | Attending: Surgery | Admitting: Surgery

## 2014-12-14 ENCOUNTER — Encounter: Payer: Self-pay | Admitting: Surgery

## 2014-12-14 VITALS — BP 120/79 | HR 76 | Resp 20 | Ht 61.0 in | Wt 152.0 lb

## 2014-12-14 DIAGNOSIS — J984 Other disorders of lung: Secondary | ICD-10-CM | POA: Diagnosis not present

## 2014-12-14 DIAGNOSIS — R911 Solitary pulmonary nodule: Secondary | ICD-10-CM

## 2014-12-14 DIAGNOSIS — Z79899 Other long term (current) drug therapy: Secondary | ICD-10-CM | POA: Diagnosis not present

## 2014-12-14 DIAGNOSIS — R918 Other nonspecific abnormal finding of lung field: Secondary | ICD-10-CM | POA: Diagnosis not present

## 2014-12-14 LAB — CBC
HGB: 12.6 g/dL
WBC: 4
platelet count: 232

## 2014-12-14 LAB — COMPLETE METABOLIC PANEL WITH GFR
ALT: 38
AST: 46 U/L
Alkaline Phosphatase: 75 U/L
BILIRUBIN TOTAL: 0.4 mg/dL
CREATININE: 1.15
GLUCOSE: 77
POTASSIUM: 4.5 mmol/L
SODIUM: 140

## 2014-12-14 NOTE — Progress Notes (Signed)
HPI:  She returns today for follow-up of a 6 mm right lower lobe nodule seen on CT scan of the chest in 09/2012. It was unchanged on her CT of the chest on 05/10/2013 and 11/05/2013. She says she has felt well with no cough or sputum production. She denies dyspnea and chest pain.   Current Outpatient Prescriptions  Medication Sig Dispense Refill  . amLODipine (NORVASC) 10 MG tablet Take 1 tablet (10 mg total) by mouth daily. 90 tablet 1  . amoxicillin (AMOXIL) 500 MG capsule Take 4 capsules (2,000 mg total) by mouth once. FOR DENTAL PROCEDURE 4 capsule 2  . antiseptic oral rinse (BIOTENE) LIQD 15 mLs by Mouth Rinse route as needed (Oral hygiene).     Marland Kitchen aspirin 325 MG EC tablet Take 325 mg by mouth every morning.     Marland Kitchen buPROPion (WELLBUTRIN XL) 300 MG 24 hr tablet Take 1 tablet (300 mg total) by mouth every morning. 90 tablet 2  . cholecalciferol (VITAMIN D) 1000 UNITS tablet Take 1,000 Units by mouth every morning.     . clonazePAM (KLONOPIN) 1 MG tablet Take 1 tablet (1 mg total) by mouth 3 (three) times daily as needed for anxiety. 270 tablet 2  . cycloSPORINE (RESTASIS) 0.05 % ophthalmic emulsion Place 1 drop into both eyes 2 (two) times daily. 3 each 2  . escitalopram (LEXAPRO) 20 MG tablet Take 1 tablet (20 mg total) by mouth daily. 90 tablet 2  . famotidine (PEPCID) 20 MG tablet Take 1 tablet (20 mg total) by mouth 2 (two) times daily. 180 tablet 1  . furosemide (LASIX) 20 MG tablet Take 1 tablet (20 mg total) by mouth daily as needed for edema. 90 tablet 1  . gabapentin (NEURONTIN) 300 MG capsule Take 1 tablet three times a day 270 capsule 2  . hydroxychloroquine (PLAQUENIL) 200 MG tablet Take 1 tablet (200 mg total) by mouth 2 (two) times daily.    . metoprolol succinate (TOPROL-XL) 50 MG 24 hr tablet Take 1/2 tablet in the morning and 1 tablet at night time 135 tablet 2  . pilocarpine (SALAGEN) 5 MG tablet Take 0.5-1 tablets (2.5-5 mg total) by mouth 3 (three) times daily.    Vladimir Faster Glycol-Propyl Glycol (SYSTANE) 0.4-0.3 % GEL Apply 1 drop to eye 2 (two) times daily.     Marland Kitchen tiZANidine (ZANAFLEX) 4 MG tablet Take 1 tablet by mouth  every 8 hours as needed  muscle spasms 30 tablet 3  . traMADol (ULTRAM) 50 MG tablet Take 1 tablet (50 mg total) by mouth every 12 (twelve) hours as needed for severe pain. 30 tablet 3  . VOLTAREN 1 % GEL Apply 2 g topically as needed (FOR PAIN).   3  . ZETIA 10 MG tablet Take 10 mg by mouth daily.   13  . zolpidem (AMBIEN CR) 12.5 MG CR tablet Take 1 tablet (12.5 mg total) by mouth at bedtime as needed for sleep. 90 tablet 2   No current facility-administered medications for this visit.     Physical Exam: BP 120/79 mmHg  Pulse 76  Resp 20  Ht 5\' 1"  (1.549 m)  Wt 152 lb (68.947 kg)  BMI 28.74 kg/m2  SpO2 97% Neck: Normal range of motion. Neck supple. No tracheal deviation present. No thyromegaly present. There is no cervical or supraclavicular adenopathy. Cardiovascular: Normal rate, regular rhythm, normal heart sounds and intact distal pulses. No murmur  Pulmonary/Chest: Effort normal and breath sounds normal.  No respiratory distress. She has no rales.  Diagnostic Tests:  CLINICAL DATA: Follow up pulmonary nodule. Shortness of breath with dry cough for a few weeks. Weight loss. History of rheumatic heart disease with mitral valve repair in 1980. Subsequent encounter.  EXAM: CT CHEST WITHOUT CONTRAST  TECHNIQUE: Multidetector CT imaging of the chest was performed following the standard protocol without IV contrast.  COMPARISON: Chest CT 11/15/2013, 05/10/2013 and 09/23/2012.  FINDINGS: Mediastinum/Nodes: There are no enlarged mediastinal, hilar or axillary lymph nodes.There are stable small AP window and right paratracheal lymph nodes. There are stable low-density left thyroid lesions. The trachea and esophagus demonstrate no significant findings. There is stable left atrial enlargement status post  median sternotomy. No significant pericardial effusion. There is stable high density along the ascending aorta, right atrium and left ventricular apex.  Lungs/Pleura: There is no pleural effusion. Ill-defined 6 mm right lower lobe nodule on image number 32 is stable from 09/23/2012, consistent with a benign finding. There are other additional scattered tiny nodules which are stable as well, including 1 in the right middle lobe (image 26) and tiny subpleural left lower lobe nodules on images 27 and 43. There are new peribronchial opacities with a tree-in-bud distribution in the left upper lobe on images 18 through 21, likely inflammatory.  Upper abdomen: The visualized upper abdomen appears stable. There is a stable small low-density lesion in the lateral segment of the left hepatic lobe.  Musculoskeletal/Chest wall: There is no chest wall mass or suspicious osseous finding.  IMPRESSION: 1. Stable 6 mm right lower lobe nodule from oldest prior study on which the lung bases were clear (09/23/2012), consistent with a benign finding. 2. New peribronchial nodularity in the left upper lobe has a tree-in-bud distribution, likely inflammatory. No suspicious nodules. 3. Stable postsurgical changes within the heart and left thyroid nodularity.   Electronically Signed  By: Richardean Sale M.D.  On: 12/14/2014 12:28   Impression:  The 6 mm right lower lobe lung nodule has been stable since 09/2012 and is felt to be benign. There are no other findings on the CT scan that require ongoing follow up.   Plan:  She will continue to follow up with her PCP, Dr. Danise Mina.   Gaye Pollack, MD [facility] [phone]

## 2014-12-20 ENCOUNTER — Telehealth: Payer: Self-pay | Admitting: *Deleted

## 2014-12-20 DIAGNOSIS — R740 Nonspecific elevation of levels of transaminase and lactic acid dehydrogenase [LDH]: Principal | ICD-10-CM

## 2014-12-20 DIAGNOSIS — R7401 Elevation of levels of liver transaminase levels: Secondary | ICD-10-CM

## 2014-12-20 NOTE — Telephone Encounter (Signed)
Labs from Dr. Estanislado Pandy in your IN box for review.

## 2014-12-22 ENCOUNTER — Telehealth: Payer: Self-pay | Admitting: Diagnostic Neuroimaging

## 2014-12-22 NOTE — Telephone Encounter (Signed)
I called patient. MRI shows milds chronic small vessel ischemic disease in the brain and moderate right foraminal stenosis at C5-6. Patient now having more generalized neck, arm, body pain, similar to her prior chronic pain syndrome. I do not recommend any further neurologic testing or intervention at this time. I do not think her cervical spine issues are amenable to surgical treatment. Advised her to continue on tramadol and gabapentin, follow-up with her rheumatologist for pain management versus referral to pain management clinic. Patient agrees with plan.  Penni Bombard, MD 91/08/9448, 3:88 PM Certified in Neurology, Neurophysiology and Neuroimaging  [facility] [address] [place], [place] [address] [phone]

## 2014-12-22 NOTE — Telephone Encounter (Signed)
Pt called requesting MRI results. She also said the HA has been for 3 weeks with dizziness and aching in neck like neck spasms. Please call and advise.  Patient can be reached at [phone].

## 2014-12-22 NOTE — Telephone Encounter (Signed)
Pt has not heard about MRI results. Pt reports that she has not had a headache in years, but has one now lasting for 3 weeks with associated dizziness and neck pain. She cannot take tylenol or ibuprofen because her liver enzymes will increase.  Pt wants to discuss MRI results of brain and c-spine and to ask Dr. Leta Baptist what to do about this headache. I advised pt that either he or I will call back with results and recommendations as soon as we can. Pt verbalized understanding.

## 2014-12-24 ENCOUNTER — Encounter: Payer: Self-pay | Admitting: *Deleted

## 2014-12-24 DIAGNOSIS — R740 Nonspecific elevation of levels of transaminase and lactic acid dehydrogenase [LDH]: Principal | ICD-10-CM

## 2014-12-24 DIAGNOSIS — R7401 Elevation of levels of liver transaminase levels: Secondary | ICD-10-CM | POA: Insufficient documentation

## 2014-12-24 NOTE — Telephone Encounter (Signed)
Reviewed. will input into chart. Mild transaminitis, stable kidney disease.

## 2014-12-27 ENCOUNTER — Encounter: Payer: Self-pay | Admitting: *Deleted

## 2015-01-09 ENCOUNTER — Emergency Department (HOSPITAL_COMMUNITY)
Admission: EM | Admit: 2015-01-09 | Discharge: 2015-01-09 | Disposition: A | Discharge status: Home / Self Care | Payer: Medicare Other | Attending: Emergency Medicine | Admitting: Emergency Medicine

## 2015-01-09 ENCOUNTER — Emergency Department (HOSPITAL_COMMUNITY): Payer: Medicare Other

## 2015-01-09 ENCOUNTER — Encounter (HOSPITAL_COMMUNITY): Payer: Self-pay | Admitting: Cardiology

## 2015-01-09 DIAGNOSIS — W1839XA Other fall on same level, initial encounter: Secondary | ICD-10-CM | POA: Diagnosis not present

## 2015-01-09 DIAGNOSIS — I129 Hypertensive chronic kidney disease with stage 1 through stage 4 chronic kidney disease, or unspecified chronic kidney disease: Secondary | ICD-10-CM | POA: Diagnosis not present

## 2015-01-09 DIAGNOSIS — I509 Heart failure, unspecified: Secondary | ICD-10-CM | POA: Diagnosis not present

## 2015-01-09 DIAGNOSIS — Z7982 Long term (current) use of aspirin: Secondary | ICD-10-CM | POA: Insufficient documentation

## 2015-01-09 DIAGNOSIS — Z86718 Personal history of other venous thrombosis and embolism: Secondary | ICD-10-CM | POA: Diagnosis not present

## 2015-01-09 DIAGNOSIS — W19XXXA Unspecified fall, initial encounter: Secondary | ICD-10-CM

## 2015-01-09 DIAGNOSIS — Z86711 Personal history of pulmonary embolism: Secondary | ICD-10-CM | POA: Insufficient documentation

## 2015-01-09 DIAGNOSIS — Z79899 Other long term (current) drug therapy: Secondary | ICD-10-CM | POA: Insufficient documentation

## 2015-01-09 DIAGNOSIS — S0990XA Unspecified injury of head, initial encounter: Secondary | ICD-10-CM | POA: Diagnosis not present

## 2015-01-09 DIAGNOSIS — N183 Chronic kidney disease, stage 3 (moderate): Secondary | ICD-10-CM | POA: Diagnosis not present

## 2015-01-09 DIAGNOSIS — M25562 Pain in left knee: Secondary | ICD-10-CM | POA: Diagnosis not present

## 2015-01-09 DIAGNOSIS — S0093XA Contusion of unspecified part of head, initial encounter: Secondary | ICD-10-CM | POA: Diagnosis not present

## 2015-01-09 DIAGNOSIS — Y998 Other external cause status: Secondary | ICD-10-CM | POA: Insufficient documentation

## 2015-01-09 DIAGNOSIS — M797 Fibromyalgia: Secondary | ICD-10-CM | POA: Diagnosis not present

## 2015-01-09 DIAGNOSIS — E785 Hyperlipidemia, unspecified: Secondary | ICD-10-CM | POA: Insufficient documentation

## 2015-01-09 DIAGNOSIS — F329 Major depressive disorder, single episode, unspecified: Secondary | ICD-10-CM | POA: Diagnosis not present

## 2015-01-09 DIAGNOSIS — F419 Anxiety disorder, unspecified: Secondary | ICD-10-CM | POA: Insufficient documentation

## 2015-01-09 DIAGNOSIS — Y9289 Other specified places as the place of occurrence of the external cause: Secondary | ICD-10-CM | POA: Insufficient documentation

## 2015-01-09 DIAGNOSIS — S0083XA Contusion of other part of head, initial encounter: Secondary | ICD-10-CM | POA: Diagnosis not present

## 2015-01-09 DIAGNOSIS — Y9389 Activity, other specified: Secondary | ICD-10-CM | POA: Insufficient documentation

## 2015-01-09 DIAGNOSIS — S8992XA Unspecified injury of left lower leg, initial encounter: Secondary | ICD-10-CM | POA: Diagnosis not present

## 2015-01-09 DIAGNOSIS — R220 Localized swelling, mass and lump, head: Secondary | ICD-10-CM | POA: Diagnosis not present

## 2015-01-09 LAB — CBC
HEMATOCRIT: 38.7 % (ref 36.0–46.0)
HEMOGLOBIN: 12.5 g/dL (ref 12.0–15.0)
MCH: 27.1 pg (ref 26.0–34.0)
MCHC: 32.3 g/dL (ref 30.0–36.0)
MCV: 83.9 fL (ref 78.0–100.0)
Platelets: 193 10*3/uL (ref 150–400)
RBC: 4.61 MIL/uL (ref 3.87–5.11)
RDW: 14.5 % (ref 11.5–15.5)
WBC: 6.5 10*3/uL (ref 4.0–10.5)

## 2015-01-09 LAB — BASIC METABOLIC PANEL
ANION GAP: 7 (ref 5–15)
BUN: 9 mg/dL (ref 6–20)
CHLORIDE: 106 mmol/L (ref 101–111)
CO2: 27 mmol/L (ref 22–32)
Calcium: 9.6 mg/dL (ref 8.9–10.3)
Creatinine, Ser: 1.34 mg/dL — ABNORMAL HIGH (ref 0.44–1.00)
GFR calc non Af Amer: 41 mL/min — ABNORMAL LOW (ref >60)
GFR, EST AFRICAN AMERICAN: 48 mL/min — AB (ref >60)
Glucose, Bld: 89 mg/dL (ref 65–99)
POTASSIUM: 3.9 mmol/L (ref 3.5–5.1)
SODIUM: 140 mmol/L (ref 135–145)

## 2015-01-09 MED ORDER — HYDROCODONE-ACETAMINOPHEN 5-325 MG PO TABS
2.0000 | ORAL_TABLET | Freq: Once | ORAL | Status: AC
  Administered 2015-01-09: 2 via ORAL
  Filled 2015-01-09: qty 2

## 2015-01-09 NOTE — ED Notes (Signed)
Pt reports she was reaching for cane in the car and fell hitting her head on the left side. No LOc.

## 2015-01-12 ENCOUNTER — Encounter: Payer: Self-pay | Admitting: Family Medicine

## 2015-01-12 ENCOUNTER — Ambulatory Visit (INDEPENDENT_AMBULATORY_CARE_PROVIDER_SITE_OTHER): Payer: Medicare Other | Admitting: Family Medicine

## 2015-01-12 ENCOUNTER — Ambulatory Visit: Payer: Medicare Other | Attending: Family Medicine

## 2015-01-12 VITALS — BP 130/80 | HR 72 | Temp 98.3°F | Wt 155.2 lb

## 2015-01-12 DIAGNOSIS — T1490 Injury, unspecified: Secondary | ICD-10-CM

## 2015-01-12 DIAGNOSIS — N189 Chronic kidney disease, unspecified: Secondary | ICD-10-CM

## 2015-01-12 DIAGNOSIS — F339 Major depressive disorder, recurrent, unspecified: Secondary | ICD-10-CM

## 2015-01-12 DIAGNOSIS — N183 Chronic kidney disease, stage 3 unspecified: Secondary | ICD-10-CM

## 2015-01-12 DIAGNOSIS — R29898 Other symptoms and signs involving the musculoskeletal system: Secondary | ICD-10-CM | POA: Insufficient documentation

## 2015-01-12 DIAGNOSIS — R269 Unspecified abnormalities of gait and mobility: Secondary | ICD-10-CM | POA: Insufficient documentation

## 2015-01-12 DIAGNOSIS — R2689 Other abnormalities of gait and mobility: Secondary | ICD-10-CM

## 2015-01-12 DIAGNOSIS — S0083XA Contusion of other part of head, initial encounter: Secondary | ICD-10-CM | POA: Insufficient documentation

## 2015-01-12 DIAGNOSIS — R2681 Unsteadiness on feet: Secondary | ICD-10-CM

## 2015-01-12 DIAGNOSIS — W19XXXA Unspecified fall, initial encounter: Secondary | ICD-10-CM

## 2015-01-12 DIAGNOSIS — S0083XD Contusion of other part of head, subsequent encounter: Secondary | ICD-10-CM

## 2015-01-12 DIAGNOSIS — N179 Acute kidney failure, unspecified: Secondary | ICD-10-CM

## 2015-01-12 NOTE — Patient Instructions (Addendum)
Sign release for records from Dr Maudie Mercury (bone density scan) Discuss gabapentin with Dr Sallyanne Havers and klonopin with Dr Harrington Challenger (both these medicines can cause you to feel dizzy or unsteady).  Continue using cane regularly. I do agree with PT referral - for balance training and fall prevention program. Let me know if this has not been set up and I will refer for this.

## 2015-01-12 NOTE — Assessment & Plan Note (Signed)
With evidence of acute bump in Cr at ER, discussed importance of good hydration status.

## 2015-01-12 NOTE — Progress Notes (Signed)
BP 130/80 mmHg  Pulse 72  Temp(Src) 98.3 F (36.8 C) (Oral)  Wt 155 lb 4 oz (70.421 kg)   CC: ER f/u visit  Subjective:    Patient ID: Alexandria Leach, female    DOB: 1951-01-03, 64 y.o.   MRN: [id number]  HPI: Alexandria Leach is a 64 y.o. female presenting on 01/12/2015 for Follow-up   DOI 01/09/2015. Seen at ER 01/09/2015 - fell forward hitting left head without LOC and left knee while getting out of her car. Mildly lightheaded. I don't see ER visit in chart, but labs and imaging studies reviewed showing L frontal soft tissue swelling without fracture and L knee PF joint narrowing. Also Cr 1.34 with GFR 48%. Treated with vicodin. Since then feeling forehead sore.   This is 7th fall this year. Has recently seen neurology for ataxia and repeated falls. Referred to outpt PT by rheumatology but has not gone yet, requests new referral. Regularly uses cane.  Rare tramadol use. Klonopin 1mg  and gabapentin 300mg  scheduled TID.   DEXA scan - states this was done and normal, 08/2012 - will request records today.  Relevant past medical, surgical, family and social history reviewed and updated as indicated. Interim medical history since our last visit reviewed. Allergies and medications reviewed and updated. Current Outpatient Prescriptions on File Prior to Visit  Medication Sig  . amLODipine (NORVASC) 10 MG tablet Take 1 tablet (10 mg total) by mouth daily.  Marland Kitchen antiseptic oral rinse (BIOTENE) LIQD 15 mLs by Mouth Rinse route as needed (Oral hygiene).   Marland Kitchen aspirin 325 MG EC tablet Take 325 mg by mouth every morning.   . cholecalciferol (VITAMIN D) 1000 UNITS tablet Take 1,000 Units by mouth every morning.   . clonazePAM (KLONOPIN) 1 MG tablet Take 1 tablet (1 mg total) by mouth 3 (three) times daily as needed for anxiety.  . cycloSPORINE (RESTASIS) 0.05 % ophthalmic emulsion Place 1 drop into both eyes 2 (two) times daily.  Marland Kitchen escitalopram (LEXAPRO) 20 MG tablet Take 1 tablet (20 mg total) by  mouth daily.  . famotidine (PEPCID) 20 MG tablet Take 1 tablet (20 mg total) by mouth 2 (two) times daily.  . furosemide (LASIX) 20 MG tablet Take 1 tablet (20 mg total) by mouth daily as needed for edema.  . gabapentin (NEURONTIN) 300 MG capsule Take 1 tablet three times a day  . hydroxychloroquine (PLAQUENIL) 200 MG tablet Take 1 tablet (200 mg total) by mouth 2 (two) times daily.  . metoprolol succinate (TOPROL-XL) 50 MG 24 hr tablet Take 1/2 tablet in the morning and 1 tablet at night time  . pilocarpine (SALAGEN) 5 MG tablet Take 0.5-1 tablets (2.5-5 mg total) by mouth 3 (three) times daily.  Vladimir Faster Glycol-Propyl Glycol (SYSTANE) 0.4-0.3 % GEL Apply 1 drop to eye 2 (two) times daily.   Marland Kitchen tiZANidine (ZANAFLEX) 4 MG tablet Take 1 tablet by mouth  every 8 hours as needed  muscle spasms  . traMADol (ULTRAM) 50 MG tablet Take 1 tablet (50 mg total) by mouth every 12 (twelve) hours as needed for severe pain.  Marland Kitchen VOLTAREN 1 % GEL Apply 2 g topically as needed (FOR PAIN).   Marland Kitchen ZETIA 10 MG tablet Take 10 mg by mouth daily.   Marland Kitchen zolpidem (AMBIEN CR) 12.5 MG CR tablet Take 1 tablet (12.5 mg total) by mouth at bedtime as needed for sleep.  Marland Kitchen amoxicillin (AMOXIL) 500 MG capsule Take 4 capsules (2,000 mg total) by mouth  once. FOR DENTAL PROCEDURE (Patient not taking: Reported on 01/09/2015)  . buPROPion (WELLBUTRIN XL) 300 MG 24 hr tablet Take 1 tablet (300 mg total) by mouth every morning. (Patient not taking: Reported on 01/09/2015)   No current facility-administered medications on file prior to visit.    Review of Systems Per HPI unless specifically indicated in ROS section     Objective:    BP 130/80 mmHg  Pulse 72  Temp(Src) 98.3 F (36.8 C) (Oral)  Wt 155 lb 4 oz (70.421 kg)  Wt Readings from Last 3 Encounters:  01/12/15 155 lb 4 oz (70.421 kg)  01/09/15 151 lb (68.493 kg)  12/14/14 152 lb (68.947 kg)    Physical Exam  Constitutional: She is oriented to person, place, and time. She  appears well-developed and well-nourished. No distress.  HENT:  Mouth/Throat: Oropharynx is clear and moist. No oropharyngeal exudate.  Contusion L frontal forehead tender to palpation  Cardiovascular: Normal rate, regular rhythm and intact distal pulses.   Murmur (2/6 systolic murmur) heard. Pulmonary/Chest: Effort normal and breath sounds normal. No respiratory distress. She has no wheezes. She has no rales.  Musculoskeletal: She exhibits no edema.  Neurological: She is alert and oriented to person, place, and time. She displays a negative Romberg sign. Gait (unsteady, slowed and hesitant) abnormal. Coordination normal.  Get up and go test >25 seconds, unsteady gait  Skin: Skin is warm and dry. No rash noted.  Psychiatric: She has a normal mood and affect.  Nursing note and vitals reviewed.  Results for orders placed or performed during the hospital encounter of 01/09/15  CBC  Result Value Ref Range   WBC 6.5 4.0 - 10.5 K/uL   RBC 4.61 3.87 - 5.11 MIL/uL   Hemoglobin 12.5 12.0 - 15.0 g/dL   HCT 38.7 36.0 - 46.0 %   MCV 83.9 78.0 - 100.0 fL   MCH 27.1 26.0 - 34.0 pg   MCHC 32.3 30.0 - 36.0 g/dL   RDW 14.5 11.5 - 15.5 %   Platelets 193 150 - 400 K/uL  Basic metabolic panel  Result Value Ref Range   Sodium 140 135 - 145 mmol/L   Potassium 3.9 3.5 - 5.1 mmol/L   Chloride 106 101 - 111 mmol/L   CO2 27 22 - 32 mmol/L   Glucose, Bld 89 65 - 99 mg/dL   BUN 9 6 - 20 mg/dL   Creatinine, Ser 1.34 (H) 0.44 - 1.00 mg/dL   Calcium 9.6 8.9 - 10.3 mg/dL   GFR calc non Af Amer 41 (L) >60 mL/min   GFR calc Af Amer 48 (L) >60 mL/min   Anion gap 7 5 - 15   LEFT KNEE - COMPLETE 4+ VIEW COMPARISON: None. FINDINGS: Frontal, lateral, and bilateral oblique views were obtained. There is no fracture or dislocation. No joint effusion. There is moderate narrowing of the patellofemoral joint. Other joint spaces appear normal. There is a spur arising from the anterior superior  patella. IMPRESSION: Narrowing of the patellofemoral joint. No acute fracture. No joint effusion. Electronically Signed  By: Lowella Grip III M.D.  On: 01/09/2015 13:13  CT HEAD WITHOUT CONTRAST TECHNIQUE: Contiguous axial images were obtained from the base of the skull through the vertex without intravenous contrast. COMPARISON: Brain CT 08/14/2006 FINDINGS: Soft tissue swelling is demonstrated overlying the left frontal calvarium. Orbits are unremarkable. Ventricles and sulci are appropriate for patient's age. Periventricular and subcortical white matter hypodensity compatible with chronic small vessel ischemic change. No evidence for  acute cortically based infarct, intracranial hemorrhage, mass lesion or mass effect. Paranasal sinuses are unremarkable. Mastoid air cells are well aerated. Calvarium is intact. IMPRESSION: No acute intracranial process. Soft tissue swelling overlying the left frontal calvarium. Chronic small vessel ischemic changes. Electronically Signed  By: Lovey Newcomer M.D.  On: 01/09/2015 13:40    Assessment & Plan:   Problem List Items Addressed This Visit    MDD (major depressive disorder), recurrent episode ([place])    Encouraged she discuss trial of decreased klonopin dose with Dr Harrington Challenger.       Imbalance - Primary    S/p neurology eval - MS ruled out with stable MRI. ?pharmacotherapy - advised she discussed lower doses of gabapentin and klonopin with rheum/psych. Will also refer to outpt PT balance training/fall prevention program. Pt agrees.      Relevant Orders   Ambulatory referral to Physical Therapy   Contusion of forehead    Recent CT reviewed, reassuring.      Relevant Orders   Ambulatory referral to Physical Therapy   CKD (chronic kidney disease) stage 3, GFR 30-59 ml/min    With evidence of acute bump in Cr at ER, discussed importance of good hydration status.       Other Visit Diagnoses    Acute-on-chronic kidney injury [facility])         Fall with injury        Relevant Orders    Ambulatory referral to Physical Therapy        Follow up plan: No Follow-up on file.

## 2015-01-12 NOTE — Assessment & Plan Note (Signed)
S/p neurology eval - MS ruled out with stable MRI. ?pharmacotherapy - advised she discussed lower doses of gabapentin and klonopin with rheum/psych. Will also refer to outpt PT balance training/fall prevention program. Pt agrees.

## 2015-01-12 NOTE — Addendum Note (Signed)
Addended by: Ria Bush on: 01/12/2015 10:36 AM   Modules accepted: Level of Service

## 2015-01-12 NOTE — Therapy (Signed)
[facility] [address] [place], [place], [address] Phone: [phone]   Fax:  [phone]  Physical Therapy Evaluation  Patient Details  Name: Alexandria Leach MRN: [id number] Date of Birth: April 11, 1950 Referring Provider: Dr. Danise Mina  Encounter Date: 01/12/2015      PT End of Session - 01/12/15 1630    Visit Number 1   Number of Visits 17   Date for PT Re-Evaluation 03/13/15   Authorization Type G-code every 10th visit. Pt reported she also has Medicaid (secondary) but understands Medicaid will not cover what Medicare does not cover. Thus, pt responsible for out-of-pocket expense. PT will decr. frequency if pt cannot affor 2x/week.   PT Start Time 1319   PT Stop Time 1403   PT Time Calculation (min) 44 min   Equipment Utilized During Treatment Gait belt   Activity Tolerance Patient tolerated treatment well   Behavior During Therapy WFL for tasks assessed/performed      Past Medical History  Diagnosis Date  . Depression   . HTN (hypertension)   . Rheumatic heart disease 1980    s/p mitral valve repair 1980  . HLD (hyperlipidemia)   . Insomnia   . Anxiety   . History of DVT (deep vein thrombosis) several times latest 2012    receives coumadin while hospitalized  . History of pulmonary embolism 2001, 2006    completed coumadin courses  . Fibromyalgia   . Osteoarthritis     shoulders and knees, not RA per Dr Estanislado Pandy, positive ANA, positive Ro  . Personal history of urinary calculi latest 2014  . PONV (postoperative nausea and vomiting)   . Refusal of blood transfusions as patient is Jehovah's Witness   . Sjogren's syndrome ([place])   . CHF (congestive heart failure) ([place])   . Glaucoma     s/p surgery, sees ophtho Q6 mo  . CKD (chronic kidney disease) stage 3, GFR 30-59 ml/min     saw nephrologist Dr Harden Mo  . History of rheumatic fever x3  . DDD (degenerative disc disease), cervical   . Lung nodule  09/22/2012    RLL - 71mm, stable since 2014. Thought benign.     Past Surgical History  Procedure Laterality Date  . Mitral valve repair  1980    open heart  . Tubal ligation  1980  . Vaginal hysterectomy  1992    for fibroids -- partial, ovaries remain  . Cholecystectomy  11/27/2011    Procedure: LAPAROSCOPIC CHOLECYSTECTOMY WITH INTRAOPERATIVE CHOLANGIOGRAM;  Surgeon: Adin Hector, MD;  Location: [facility];  Service: General;  Laterality: N/A;  laparoscopic cholecystectomy with choleangiogram umbilical hernia repair  . Umbilical hernia repair  11/27/2011    Procedure: HERNIA REPAIR UMBILICAL ADULT;  Surgeon: Adin Hector, MD;  Location: [facility];  Service: General;  Laterality: N/A;  . Breast biopsy Right 2006    benign  . Colonoscopy  07/2014    [facility] Amede[facility] Plenty)    There were no vitals filed for this visit.  Visit Diagnosis:  Abnormality of gait - Plan: PT plan of care cert/re-cert  Weakness of both lower extremities - Plan: PT plan of care cert/re-cert  Unsteadiness - Plan: PT plan of care cert/re-cert      Subjective Assessment - 01/12/15 1326    Subjective Pt reported she has experienced about 6 falls over the last year, and has been experiencing 1 falls per week for the last 6 months. Pt reports balance has been progressively worse  over the last 6 months. Pt denied dizziness but does feel imbalanced. Pt reports she has peripheral neuropathy in B LEs.    Pertinent History HTN, Cx DDD, PE in 2006, CHF, mitral valve repair, rheumatic heart disease, fibromyalgia, peripheral neuropahty   Patient Stated Goals "Have my balance back, and not fall". Not be so stiff.   Currently in Pain? Yes   Pain Score 8    Pain Location Shoulder  pt reports she has a pinched nerve   Pain Orientation Right   Pain Descriptors / Indicators Discomfort;Aching   Pain Type Chronic pain   Pain Onset More than a month ago   Pain Frequency Constant   Aggravating Factors  walking, standing/sitting for  prolonged periods of time, using R hand (Pt is L handed)   Pain Relieving Factors pain meds            [facility] PT Assessment - 01/12/15 1331    Assessment   Medical Diagnosis Imbalance, contusion of forehead, fall with injury   Referring Provider Dr. Danise Mina   Onset Date/Surgical Date 01/11/14  with progressive decline in balance   Hand Dominance Left   Prior Therapy none   Precautions   Precautions Fall   Restrictions   Weight Bearing Restrictions No   Balance Screen   Has the patient fallen in the past 6 months Yes   How many times? --  6 in the last year with 1x/week for last 6 months   Has the patient had a decrease in activity level because of a fear of falling?  Yes   Is the patient reluctant to leave their home because of a fear of falling?  Yes   [place] residence   [place]  son-33y/o   Available Help at Discharge Family   Type of [place] to enter   Entrance Stairs-Number of Steps 3 steps in front 2 steps in back   [place] One level   [place] - single point;Walker - 4 wheels;Grab bars - tub/shower;Shower seat;Other (comment)  elevated toilet seat   Prior Function   Level of [place] Independent   Vocation Retired   Leisure Go out with friends, play Company secretary, Psychologist, occupational with senior citizens (nursing home, ALF)   Cognition   Overall Cognitive Status Impaired/Different from baseline   Memory Impaired   Memory Impairment Decreased short term memory   Decreased Short Term Memory --  Pt believes it could be from medication   Sensation   Light Touch Impaired Detail   Light Touch Impaired Details Impaired RUE;Impaired RLE   Additional Comments Decreased LT in R UE (forearm and distal to hand). Decr. R LE LT thigh to foot. Pt reports N/T in B LEs.   Coordination   Gross Motor Movements are Fluid and Coordinated Yes   Fine Motor Movements  are Fluid and Coordinated Yes   Posture/Postural Control   Posture/Postural Control Postural limitations   Postural Limitations Forward head;Posterior pelvic tilt   ROM / Strength   AROM / PROM / Strength AROM;Strength   AROM   Overall AROM  Within functional limits for tasks performed   Overall AROM Comments B UE/LE WFL.   Strength   Overall Strength Deficits   Overall Strength Comments B UE WFL. B LE 4/5, except 3/5 R hip flexion 2/2 hip pain.   Transfers   Transfers Stand to Sit;Sit to Stand  Sit to Stand 5: Supervision;With upper extremity assist;From chair/3-in-1   Stand to Sit 5: Supervision;With upper extremity assist;To chair/3-in-1   Ambulation/Gait   Ambulation/Gait Yes   Ambulation/Gait Assistance 5: Supervision   Ambulation/Gait Assistance Details No LOB, but pt amb. in very guarded manner.   Ambulation Distance (Feet) 100 Feet   Assistive device Straight cane   Gait Pattern Step-through pattern;Decreased stride length;Decreased dorsiflexion - right;Decreased hip/knee flexion - right;Decreased trunk rotation  Posteriro trunk lean to the L side   Ambulation Surface Level;Indoor   Gait velocity 1.72ft/sec.   Standardized Balance Assessment   Standardized Balance Assessment Berg Balance Test;Timed Up and Go Test   Berg Balance Test   Sit to Stand Able to stand without using hands and stabilize independently   Standing Unsupported Able to stand safely 2 minutes   Sitting with Back Unsupported but Feet Supported on Floor or Stool Able to sit safely and securely 2 minutes   Stand to Sit Sits safely with minimal use of hands   Transfers Able to transfer safely, minor use of hands   Standing Unsupported with Eyes Closed Able to stand 10 seconds with supervision   Standing Ubsupported with Feet Together Able to place feet together independently and stand for 1 minute with supervision   From Standing, Reach Forward with Outstretched Arm Can reach confidently >25 cm (10")    From Standing Position, Pick up Object from Floor Able to pick up shoe, needs supervision   From Standing Position, Turn to Look Behind Over each Shoulder Looks behind one side only/other side shows less weight shift   Turn 360 Degrees Able to turn 360 degrees safely but slowly   Standing Unsupported, Alternately Place Feet on Step/Stool Able to complete 4 steps without aid or supervision   Standing Unsupported, One Foot in Front Able to plae foot ahead of the other independently and hold 30 seconds   Standing on One Leg Tries to lift leg/unable to hold 3 seconds but remains standing independently   Total Score 44   Timed Up and Go Test   TUG Normal TUG   Normal TUG (seconds) 21.4  with [facility]                           PT Education - 01/12/15 1630    Education provided Yes   Education Details PT frequency/duration and results of outcome measures.   Person(s) Educated Patient   Methods Explanation   Comprehension Verbalized understanding          PT Short Term Goals - 01/12/15 1634    PT SHORT TERM GOAL #1   Title Pt will be IND in performing HEP to improve balance, strength and endurance. Target date: 02/09/15.   Status New   PT SHORT TERM GOAL #2   Title Pt will amb. 300' over even terrain with LRAD at MOD I level to improve functional mobility. Target date: 02/09/15.   Status New   PT SHORT TERM GOAL #3   Title Pt will perform TUG in </=13.5sec. with LRAD to decr. falls risk. Target date: 02/09/15.   Status New   PT SHORT TERM GOAL #4   Title Pt will improve gait speed with LRAD to >/=1.44ft/sec. to decr. falls risk. Target date: 02/09/15.   Status New   PT SHORT TERM GOAL #5   Title Pt will improve BERG score to >/=48/56 to decr. falls risk. Target date: 02/09/15.   Status New  PT Long Term Goals - February 08, 2015 1636    PT LONG TERM GOAL #1   Title Pt will verbalize understanding of fall prevention strategies to decr. fall risk. Target date:  03/09/15.   Status New   PT LONG TERM GOAL #2   Title Pt will amb. 600' over even/uneven terrain with LRAD at MOD I level to improve functional mobility. Target date: 03/09/15.   Status New   PT LONG TERM GOAL #3   Title Pt will improve gait speed with LRAD to >/=2.57ft/sec to amb. safely the community. Target date: 03/09/15.   Status New   PT LONG TERM GOAL #4   Title Pt will report no falls in the last 2 weeks to improve safety. Target date: 03/09/15.   Status New   PT LONG TERM GOAL #5   Title Pt will ascend/descend 4 steps, with no handrails, at MOD I level (increased time) to traverse stairs at home safely. Target date: 03/09/15.   Status New   Additional Long Term Goals   Additional Long Term Goals Yes   PT LONG TERM GOAL #6   Title Pt will improve BERG score to >/=52/56 to decr. falls risk. Target date: 03/09/15.   Status New               Plan - 08-Feb-2015 1631    Clinical Impression Statement Pt is a pleasant 64y/o female presenting to OPPT neuro s/p falls with contusion of forehead per MD. Pt presented with impaired balance, gait deviations, difficulty sequencing with SPC, decrease strength, pain (will not directly address but will monitor closely), and decr. endurance. Pt's TUG score, BERG score, and gait speed all indicate pt is at risk for falls.   Pt will benefit from skilled therapeutic intervention in order to improve on the following deficits Abnormal gait;Decreased endurance;Decreased knowledge of use of DME;Decreased strength;Decreased mobility;Decreased balance;Impaired flexibility;Pain  will not directly address pain but will monitor   Rehab Potential Good   PT Frequency 2x / week   PT Duration 8 weeks   PT Treatment/Interventions ADLs/Self Care Home Management;Neuromuscular re-education;Vestibular;Manual techniques;Balance training;Therapeutic exercise;Therapeutic activities;Functional mobility training;Electrical Stimulation;Stair training;Gait  training;Orthotic Fit/Training;Patient/family education;DME Instruction;Biofeedback   PT Next Visit Plan Sequencing with SPC, initiate HEP (balance and strength).   Consulted and Agree with Plan of Care Patient          G-Codes - 02-08-15 1639    Functional Assessment Tool Used BERG: 44/56; TUG with SPC: 21.4sec.; gait speed with SPC: 1.27ft/sec.   Functional Limitation Mobility: Walking and moving around   Mobility: Walking and Moving Around Current Status [phone]) At least 40 percent but less than 60 percent impaired, limited or restricted   Mobility: Walking and Moving Around Goal Status [phone]) At least 1 percent but less than 20 percent impaired, limited or restricted       Problem List Patient Active Problem List   Diagnosis Date Noted  . Contusion of forehead 02-08-2015  . Imbalance 2015-02-08  . Transaminitis 12/24/2014  . DDD (degenerative disc disease), cervical   . Health maintenance examination 10/18/2014  . Advanced care planning/counseling discussion 10/18/2014  . Medicare annual wellness visit, initial 10/18/2014  . Refusal of blood transfusions as patient is Jehovah's Witness   . Sjogren's syndrome ([place])   . CKD (chronic kidney disease) stage 3, GFR 30-59 ml/min   . Glaucoma   . Fibromyalgia   . Osteoarthritis   . History of pulmonary embolism   . Lung nodule 09/22/2012  . Diastolic CHF (  [place]) 12/04/2011  . Pneumonia 12/02/2011  . Aortic stenosis 04/22/2011  . Palpitations 08/10/2010  . Dyspnea 08/10/2010  . HOT FLASHES 06/28/2008  . BURSITIS, HIP 06/28/2008  . ARTHRALGIA 07/01/2007  . BACK PAIN, LEFT 08/25/2006  . GAD (generalized anxiety disorder) 03/28/2006  . MITRAL VALVE REPLACEMENT, HX OF 03/28/2006  . TAH/BSO, HX OF 03/28/2006  . HYPERCHOLESTEROLEMIA 12/31/2005  . MDD (major depressive disorder), recurrent episode ([place]) 12/31/2005  . RHEUMATIC HEART DISEASE 12/31/2005  . Essential hypertension 12/31/2005  . Chronic insomnia 12/31/2005     Ayzia Day L 01/12/2015, 4:41 PM  [facility] [address] [place], [place], [address] Phone: [phone]   Fax:  [phone]  Name: Alexandria Leach MRN: [id number] Date of Birth: [birth year]   Geoffry Paradise, PT,DPT 01/12/2015 4:41 PM Phone: [phone] Fax: [phone]

## 2015-01-12 NOTE — Assessment & Plan Note (Signed)
Encouraged she discuss trial of decreased klonopin dose with Dr Harrington Challenger.

## 2015-01-12 NOTE — Assessment & Plan Note (Signed)
Recent CT reviewed, reassuring.

## 2015-01-12 NOTE — Progress Notes (Signed)
Pre visit review using our clinic review tool, if applicable. No additional management support is needed unless otherwise documented below in the visit note. 

## 2015-01-14 NOTE — ED Provider Notes (Signed)
CSN: [id number]     Arrival date & time 01/09/15  9381 History   First MD Initiated Contact with Patient 01/09/15 1113     Chief Complaint  Patient presents with  . Fall  . Head Injury     (Consider location/radiation/quality/duration/timing/severity/associated sxs/prior Treatment) Patient is a 64 y.o. female presenting with fall and head injury. The history is provided by the patient.  Fall This is a recurrent problem. The current episode started 1 to 2 hours ago. The problem occurs every several days. The problem has not changed since onset.Pertinent negatives include no chest pain, no abdominal pain and no shortness of breath. Nothing aggravates the symptoms. Nothing relieves the symptoms.  Head Injury   Past Medical History  Diagnosis Date  . Depression   . HTN (hypertension)   . Rheumatic heart disease 1980    s/p mitral valve repair 1980  . HLD (hyperlipidemia)   . Insomnia   . Anxiety   . History of DVT (deep vein thrombosis) several times latest 2012    receives coumadin while hospitalized  . History of pulmonary embolism 2001, 2006    completed coumadin courses  . Fibromyalgia   . Osteoarthritis     shoulders and knees, not RA per Dr Estanislado Pandy, positive ANA, positive Ro  . Personal history of urinary calculi latest 2014  . PONV (postoperative nausea and vomiting)   . Refusal of blood transfusions as patient is Jehovah's Witness   . Sjogren's syndrome ([place])   . CHF (congestive heart failure) ([place])   . Glaucoma     s/p surgery, sees ophtho Q6 mo  . CKD (chronic kidney disease) stage 3, GFR 30-59 ml/min     saw nephrologist Dr Harden Mo  . History of rheumatic fever x3  . DDD (degenerative disc disease), cervical   . Lung nodule 09/22/2012    RLL - 2mm, stable since 2014. Thought benign.    Past Surgical History  Procedure Laterality Date  . Mitral valve repair  1980    open heart  . Tubal ligation  1980  . Vaginal hysterectomy  1992    for fibroids -- partial,  ovaries remain  . Cholecystectomy  11/27/2011    Procedure: LAPAROSCOPIC CHOLECYSTECTOMY WITH INTRAOPERATIVE CHOLANGIOGRAM;  Surgeon: Adin Hector, MD;  Location: [facility];  Service: General;  Laterality: N/A;  laparoscopic cholecystectomy with choleangiogram umbilical hernia repair  . Umbilical hernia repair  11/27/2011    Procedure: HERNIA REPAIR UMBILICAL ADULT;  Surgeon: Adin Hector, MD;  Location: [facility];  Service: General;  Laterality: N/A;  . Breast biopsy Right 2006    benign  . Colonoscopy  07/2014    WNL Amedeo Plenty)   Family History  Problem Relation Age of Onset  . Lupus Sister     and niece  . Cancer Mother     lung (nonsmoker)  . CAD Mother     MI in her 5s  . ALS Mother   . Kidney disease Father   . Alcohol abuse Father   . Diabetes Father   . Cancer Brother     bone  . Cancer Maternal Uncle     bone  . Depression Sister   . Kidney failure Other     on HD  . Diabetes Brother   . Diabetes Sister   . Stroke Maternal Grandmother   . Stroke Sister    Social History  Substance Use Topics  . Smoking status: Never Smoker   . Smokeless tobacco: Never  Used  . Alcohol Use: No   OB History    No data available     Review of Systems  Constitutional: Negative for fever.  Respiratory: Negative for cough and shortness of breath.   Cardiovascular: Negative for chest pain.  Gastrointestinal: Negative for abdominal pain.  All other systems reviewed and are negative.     Allergies  Cymbalta; Statins; and Sulfa antibiotics  Home Medications   Prior to Admission medications   Medication Sig Start Date End Date Taking? Authorizing Provider  amLODipine (NORVASC) 10 MG tablet Take 1 tablet (10 mg total) by mouth daily. 07/26/14  Yes Ria Bush, MD  antiseptic oral rinse (BIOTENE) LIQD 15 mLs by Mouth Rinse route as needed (Oral hygiene).    Yes Historical Provider, MD  aspirin 325 MG EC tablet Take 325 mg by mouth every morning.    Yes Historical Provider, MD    cholecalciferol (VITAMIN D) 1000 UNITS tablet Take 1,000 Units by mouth every morning.    Yes Historical Provider, MD  cycloSPORINE (RESTASIS) 0.05 % ophthalmic emulsion Place 1 drop into both eyes 2 (two) times daily. 07/26/14  Yes Ria Bush, MD  escitalopram (LEXAPRO) 20 MG tablet Take 1 tablet (20 mg total) by mouth daily. 11/11/14 11/11/15 Yes Cloria Spring, MD  famotidine (PEPCID) 20 MG tablet Take 1 tablet (20 mg total) by mouth 2 (two) times daily. 07/27/14  Yes Ria Bush, MD  gabapentin (NEURONTIN) 300 MG capsule Take 1 tablet three times a day 07/26/14  Yes Ria Bush, MD  hydroxychloroquine (PLAQUENIL) 200 MG tablet Take 1 tablet (200 mg total) by mouth 2 (two) times daily. 11/05/14  Yes Ria Bush, MD  metoprolol succinate (TOPROL-XL) 50 MG 24 hr tablet Take 1/2 tablet in the morning and 1 tablet at night time 07/26/14  Yes Ria Bush, MD  pilocarpine (SALAGEN) 5 MG tablet Take 0.5-1 tablets (2.5-5 mg total) by mouth 3 (three) times daily. 11/05/14  Yes Ria Bush, MD  Polyethyl Glycol-Propyl Glycol (SYSTANE) 0.4-0.3 % GEL Apply 1 drop to eye 2 (two) times daily.    Yes Historical Provider, MD  tiZANidine (ZANAFLEX) 4 MG tablet Take 1 tablet by mouth  every 8 hours as needed  muscle spasms 12/07/14  Yes Ria Bush, MD  traMADol (ULTRAM) 50 MG tablet Take 1 tablet (50 mg total) by mouth every 12 (twelve) hours as needed for severe pain. 09/08/14  Yes Ria Bush, MD  VOLTAREN 1 % GEL Apply 2 g topically as needed (FOR PAIN).  11/08/13  Yes Historical Provider, MD  ZETIA 10 MG tablet Take 10 mg by mouth daily.  01/12/14  Yes Historical Provider, MD  zolpidem (AMBIEN CR) 12.5 MG CR tablet Take 1 tablet (12.5 mg total) by mouth at bedtime as needed for sleep. 09/20/14 09/20/15 Yes Cloria Spring, MD  amoxicillin (AMOXIL) 500 MG capsule Take 4 capsules (2,000 mg total) by mouth once. FOR DENTAL PROCEDURE Patient not taking: Reported on 01/09/2015 07/26/14    Ria Bush, MD  buPROPion (WELLBUTRIN XL) 300 MG 24 hr tablet Take 1 tablet (300 mg total) by mouth every morning. Patient not taking: Reported on 01/09/2015 11/11/14 11/11/15  Cloria Spring, MD  clonazePAM (KLONOPIN) 1 MG tablet Take 1 tablet (1 mg total) by mouth 3 (three) times daily as needed for anxiety. 09/20/14   Cloria Spring, MD  furosemide (LASIX) 20 MG tablet Take 1 tablet (20 mg total) by mouth daily as needed for edema. 07/27/14   Garlon Hatchet  Danise Mina, MD   BP 125/71 mmHg  Pulse 76  Temp(Src) 98.1 F (36.7 C) (Oral)  Resp 12  Ht 5\' 1"  (1.549 m)  Wt 151 lb (68.493 kg)  BMI 28.55 kg/m2  SpO2 97% Physical Exam  Constitutional: She is oriented to person, place, and time. She appears well-developed and well-nourished. No distress.  HENT:  Head: Normocephalic.    Mouth/Throat: Oropharynx is clear and moist.  Eyes: EOM are normal. Pupils are equal, round, and reactive to light.  Neck: Normal range of motion. Neck supple.  Cardiovascular: Normal rate and regular rhythm.  Exam reveals no friction rub.   No murmur heard. Pulmonary/Chest: Effort normal and breath sounds normal. No respiratory distress. She has no wheezes. She has no rales.  Abdominal: Soft. She exhibits no distension. There is no tenderness. There is no rebound.  Musculoskeletal: Normal range of motion. She exhibits no edema.  Neurological: She is alert and oriented to person, place, and time.  Skin: No rash noted. She is not diaphoretic.  Nursing note and vitals reviewed.   ED Course  Procedures (including critical care time) Labs Review Labs Reviewed  BASIC METABOLIC PANEL - Abnormal; Notable for the following:    Creatinine, Ser 1.34 (*)    GFR calc non Af Amer 41 (*)    GFR calc Af Amer 48 (*)    All other components within normal limits  CBC    Imaging Review No results found. I have personally reviewed and evaluated these images and lab results as part of my medical decision-making.   EKG  Interpretation   Date/Time:  Monday January 09 2015 12:19:48 EDT Ventricular Rate:  73 PR Interval:  169 QRS Duration: 83 QT Interval:  404 QTC Calculation: 445 R Axis:   79 Text Interpretation:  Sinus rhythm Borderline T abnormalities, anterior  leads No significant change since last tracing Confirmed by Mingo Amber  MD,  [place] [facility] ([id number]) on 01/09/2015 12:49:59 PM      MDM   Final diagnoses:  Fall, initial encounter   This chart entry is almost 5 days late and documented to the best of my recollection.  64 year old female here after a fall. History of recurrent falls. Imaging and labs ok. Stable for discharge.    Evelina Bucy, MD 01/14/15 [phone]

## 2015-01-17 ENCOUNTER — Ambulatory Visit: Payer: Medicare Other | Attending: Family Medicine

## 2015-01-17 DIAGNOSIS — R269 Unspecified abnormalities of gait and mobility: Secondary | ICD-10-CM | POA: Insufficient documentation

## 2015-01-17 DIAGNOSIS — R29898 Other symptoms and signs involving the musculoskeletal system: Secondary | ICD-10-CM | POA: Diagnosis not present

## 2015-01-17 DIAGNOSIS — R2681 Unsteadiness on feet: Secondary | ICD-10-CM | POA: Diagnosis not present

## 2015-01-17 NOTE — Therapy (Addendum)
[facility] [address] [place], [place], [address] Phone: [phone]   Fax:  [phone]  Physical Therapy Treatment  Patient Details  Name: Alexandria Leach MRN: [id number] Date of Birth: July 09, 1950 Referring Provider: Dr. Danise Mina  Encounter Date: 01/17/2015      PT End of Session - 01/17/15 1515    Visit Number 2   Number of Visits 17   Date for PT Re-Evaluation 03/13/15   Authorization Type G-code every 10th visit. Pt reported she also has [place] (secondary) but understands Medicaid will not cover what Medicare does not cover. Thus, pt responsible for out-of-pocket expense. PT will decr. frequency if pt cannot affor 2x/week.   PT Start Time 1316   PT Stop Time 1359   PT Time Calculation (min) 43 min   Equipment Utilized During Treatment Gait belt   Activity Tolerance Patient tolerated treatment well   Behavior During Therapy WFL for tasks assessed/performed      Past Medical History  Diagnosis Date  . Depression   . HTN (hypertension)   . Rheumatic heart disease 1980    s/p mitral valve repair 1980  . HLD (hyperlipidemia)   . Insomnia   . Anxiety   . History of DVT (deep vein thrombosis) several times latest 2012    receives coumadin while hospitalized  . History of pulmonary embolism 2001, 2006    completed coumadin courses  . Fibromyalgia   . Osteoarthritis     shoulders and knees, not RA per Dr Estanislado Pandy, positive ANA, positive Ro  . Personal history of urinary calculi latest 2014  . PONV (postoperative nausea and vomiting)   . Refusal of blood transfusions as patient is Jehovah's Witness   . Sjogren's syndrome ([place])   . CHF (congestive heart failure) ([place])   . Glaucoma     s/p surgery, sees ophtho Q6 mo  . CKD (chronic kidney disease) stage 3, GFR 30-59 ml/min     saw nephrologist Dr Harden Mo  . History of rheumatic fever x3  . DDD (degenerative disc disease), cervical   . Lung nodule  09/22/2012    RLL - 61mm, stable since 2014. Thought benign.     Past Surgical History  Procedure Laterality Date  . Mitral valve repair  1980    open heart  . Tubal ligation  1980  . Vaginal hysterectomy  1992    for fibroids -- partial, ovaries remain  . Cholecystectomy  11/27/2011    Procedure: LAPAROSCOPIC CHOLECYSTECTOMY WITH INTRAOPERATIVE CHOLANGIOGRAM;  Surgeon: Adin Hector, MD;  Location: [facility];  Service: General;  Laterality: N/A;  laparoscopic cholecystectomy with choleangiogram umbilical hernia repair  . Umbilical hernia repair  11/27/2011    Procedure: HERNIA REPAIR UMBILICAL ADULT;  Surgeon: Adin Hector, MD;  Location: [facility];  Service: General;  Laterality: N/A;  . Breast biopsy Right 2006    benign  . Colonoscopy  07/2014    WNL Amedeo Plenty)    There were no vitals filed for this visit.  Visit Diagnosis:  Unsteadiness  Abnormality of gait      Subjective Assessment - 01/17/15 1318    Subjective Pt denied falls or changes since last visit.    Pertinent History HTN, Cx DDD, PE in 2006, CHF, mitral valve repair, rheumatic heart disease, fibromyalgia, peripheral neuropahty   Patient Stated Goals "Have my balance back, and not fall". Not be so stiff.   Currently in Pain? Yes   Pain Score 6  Pain Location Hip   Pain Orientation Right   Pain Descriptors / Indicators --  it just hurts   Pain Type Chronic pain   Pain Onset More than a month ago   Pain Frequency Constant   Aggravating Factors  nothing   Pain Relieving Factors nothing        Neuro re-ed: Pt performed balance activities in corner with min guard to S for safety. Cues for technique. Please see pt instructions for HEP details. Performed on compliant and non-compliant surfaces with 10-30 second holds or x10 reps: feet apart/together with eyes open/closed, with and without head turns, tandem stance, single leg stance.  Therex: Pt performed stretch HEP to improve flexibility during gait, cues and  demonstration for technique. Please see pt instructions for details. Pt also performed B supine hip flexor stretch, with good ROM 1x30sec. Hold. Not added to HEP, as pt did not experience impaired flexibility in B hip flexors.                         PT Education - 01/17/15 1515    Education provided Yes   Education Details Balance and stretching HEP.   Person(s) Educated Patient   Methods Explanation;Demonstration;Tactile cues;Verbal cues;Handout   Comprehension Returned demonstration;Verbalized understanding;Need further instruction          PT Short Term Goals - 01/17/15 1518    PT SHORT TERM GOAL #1   Title Pt will be IND in performing HEP to improve balance, strength and endurance. Target date: 02/09/15.   Status On-going   PT SHORT TERM GOAL #2   Title Pt will amb. 300' over even terrain with LRAD at MOD I level to improve functional mobility. Target date: 02/09/15.   Status On-going   PT SHORT TERM GOAL #3   Title Pt will perform TUG in </=13.5sec. with LRAD to decr. falls risk. Target date: 02/09/15.   Status On-going   PT SHORT TERM GOAL #4   Title Pt will improve gait speed with LRAD to >/=1.70ft/sec. to decr. falls risk. Target date: 02/09/15.   Status On-going   PT SHORT TERM GOAL #5   Title Pt will improve BERG score to >/=48/56 to decr. falls risk. Target date: 02/09/15.   Status On-going           PT Long Term Goals - 01/17/15 1518    PT LONG TERM GOAL #1   Title Pt will verbalize understanding of fall prevention strategies to decr. fall risk. Target date: 03/09/15.   Status On-going   PT LONG TERM GOAL #2   Title Pt will amb. 600' over even/uneven terrain with LRAD at MOD I level to improve functional mobility. Target date: 03/09/15.   Status On-going   PT LONG TERM GOAL #3   Title Pt will improve gait speed with LRAD to >/=2.53ft/sec to amb. safely the community. Target date: 03/09/15.   Status On-going   PT LONG TERM GOAL #4   Title  Pt will report no falls in the last 2 weeks to improve safety. Target date: 03/09/15.   Status On-going   PT LONG TERM GOAL #5   Title Pt will ascend/descend 4 steps, with no handrails, at MOD I level (increased time) to traverse stairs at home safely. Target date: 03/09/15.   Status On-going   PT LONG TERM GOAL #6   Title Pt will improve BERG score to >/=52/56 to decr. falls risk. Target date: 03/09/15.   Status On-going  Plan - 01/17/15 1516    Clinical Impression Statement Pt tolerated balance and stretching HEP well, as pt reported R hip decr. from 6/10 to 0/10 after stretching HEP. Pt experienced incr. postural sway and LOB during activities with eyes closed, head turns, and standing on compliant surfaces all indicating decr. vestibular input. Pt would continue to benefit from skilled PT to improve safety during functional mobility.   Pt will benefit from skilled therapeutic intervention in order to improve on the following deficits Abnormal gait;Decreased endurance;Decreased knowledge of use of DME;Decreased strength;Decreased mobility;Decreased balance;Impaired flexibility;Pain   Rehab Potential Good   PT Frequency 2x / week   PT Duration 8 weeks   PT Treatment/Interventions ADLs/Self Care Home Management;Neuromuscular re-education;Vestibular;Manual techniques;Balance training;Therapeutic exercise;Therapeutic activities;Functional mobility training;Electrical Stimulation;Stair training;Gait training;Orthotic Fit/Training;Patient/family education;DME Instruction;Biofeedback   PT Next Visit Plan Fall prevention handout. Strengthening HEP (formally assess glute med strength)   PT Home Exercise Plan Stretch/balance HEP   Consulted and Agree with Plan of Care Patient        Problem List Patient Active Problem List   Diagnosis Date Noted  . Contusion of forehead 01/12/2015  . Imbalance 01/12/2015  . Transaminitis 12/24/2014  . DDD (degenerative disc disease),  cervical   . Health maintenance examination 10/18/2014  . Advanced care planning/counseling discussion 10/18/2014  . Medicare annual wellness visit, initial 10/18/2014  . Refusal of blood transfusions as patient is Jehovah's Witness   . Sjogren's syndrome ([place])   . CKD (chronic kidney disease) stage 3, GFR 30-59 ml/min   . Glaucoma   . Fibromyalgia   . Osteoarthritis   . History of pulmonary embolism   . Lung nodule 09/22/2012  . Diastolic CHF ([place]) 83/15/1761  . Pneumonia 12/02/2011  . Aortic stenosis 04/22/2011  . Palpitations 08/10/2010  . Dyspnea 08/10/2010  . HOT FLASHES 06/28/2008  . BURSITIS, HIP 06/28/2008  . ARTHRALGIA 07/01/2007  . BACK PAIN, LEFT 08/25/2006  . GAD (generalized anxiety disorder) 03/28/2006  . MITRAL VALVE REPLACEMENT, HX OF 03/28/2006  . TAH/BSO, HX OF 03/28/2006  . HYPERCHOLESTEROLEMIA 12/31/2005  . MDD (major depressive disorder), recurrent episode ([place]) 12/31/2005  . RHEUMATIC HEART DISEASE 12/31/2005  . Essential hypertension 12/31/2005  . Chronic insomnia 12/31/2005    Aunesty Tyson L 01/17/2015, 3:19 PM  [facility] [address] [place], [place], [address] Phone: [phone]   Fax:  [phone]  Name: ASHANA TULLO MRN: [id number] Date of Birth: 1950-10-14    Geoffry Paradise, PT,DPT 01/17/2015 3:19 PM Phone: [phone] Fax: [phone]

## 2015-01-17 NOTE — Patient Instructions (Signed)
Perform in a corner with a chair in front of you:  Feet Heel-Toe "Tandem", Varied Arm Positions - Eyes Open    With eyes open, right foot directly in front of the other, arms at your side, look straight ahead at a stationary object. Hold _10-30___ seconds. Repeat with left foot directly in front of the other. Repeat __3__ times per session per leg. Do __1__ sessions per day.  Copyright  VHI. All rights reserved.  Single Leg- Eyes Open    While standing on left leg, and hold for 10-30 seconds. Repeat standing on right leg for 10-30 seconds. Repeat __3__ times per session per leg. Do __1__ sessions per day.  Copyright  VHI. All rights reserved.  Feet Together (Compliant Surface) Head Motion - Eyes Closed    Stand on compliant surface: __pillow/cushion______ with feet together. Close eyes and move head slowly, up and down 10 times and side to side 10 times. Repeat __3__ times per session. Do _1___ sessions per day.  Copyright  VHI. All rights reserved.    Piriformis Stretch, Supine    Lie supine, one ankle crossed onto opposite knee. Holding bottom leg behind knee, gently pull legs toward chest until stretch is felt in buttock of top leg. Hold _30__ seconds. For deeper stretch gently push top knee away from body. Repeat with the other leg. Repeat _3__ times per session. Do _2__ sessions per day.  Copyright  VHI. All rights reserved.   HIP: Hamstrings - Short Sitting    Rest leg on the floor. Keep knee straight. Lift chest. Hold _30__ seconds. Repeat with other leg. _3__ reps per set, _2-3__ sets per day, __7_ days per week  Copyright  VHI. All rights reserved.

## 2015-01-19 ENCOUNTER — Ambulatory Visit: Payer: Medicare Other

## 2015-01-19 DIAGNOSIS — R2681 Unsteadiness on feet: Secondary | ICD-10-CM | POA: Diagnosis not present

## 2015-01-19 DIAGNOSIS — R269 Unspecified abnormalities of gait and mobility: Secondary | ICD-10-CM

## 2015-01-19 DIAGNOSIS — R29898 Other symptoms and signs involving the musculoskeletal system: Secondary | ICD-10-CM | POA: Diagnosis not present

## 2015-01-19 NOTE — Patient Instructions (Signed)
Fall Prevention in the Home  Falls can cause injuries and can affect people from all age groups. There are many simple things that you can do to make your home safe and to help prevent falls. WHAT CAN I DO ON THE OUTSIDE OF MY HOME?  Regularly repair the edges of walkways and driveways and fix any cracks.  Remove high doorway thresholds.  Trim any shrubbery on the main path into your home.  Use bright outdoor lighting.  Clear walkways of debris and clutter, including tools and rocks.  Regularly check that handrails are securely fastened and in good repair. Both sides of any steps should have handrails.  Install guardrails along the edges of any raised decks or porches.  Have leaves, snow, and ice cleared regularly.  Use sand or salt on walkways during winter months.  In the garage, clean up any spills right away, including grease or oil spills. WHAT CAN I DO IN THE BATHROOM?  Use night lights.  Install grab bars by the toilet and in the tub and shower. Do not use towel bars as grab bars.  Use non-skid mats or decals on the floor of the tub or shower.  If you need to sit down while you are in the shower, use a plastic, non-slip stool..  Keep the floor dry. Immediately clean up any water that spills on the floor.  Remove soap buildup in the tub or shower on a regular basis.  Attach bath mats securely with double-sided non-slip rug tape.  Remove throw rugs and other tripping hazards from the floor. WHAT CAN I DO IN THE BEDROOM?  Use night lights.  Make sure that a bedside light is easy to reach.  Do not use oversized bedding that drapes onto the floor.  Have a firm chair that has side arms to use for getting dressed.  Remove throw rugs and other tripping hazards from the floor. WHAT CAN I DO IN THE KITCHEN?   Clean up any spills right away.  Avoid walking on wet floors.  Place frequently used items in easy-to-reach places.  If you need to reach for something  above you, use a sturdy step stool that has a grab bar.  Keep electrical cables out of the way.  Do not use floor polish or wax that makes floors slippery. If you have to use wax, make sure that it is non-skid floor wax.  Remove throw rugs and other tripping hazards from the floor. WHAT CAN I DO IN THE STAIRWAYS?  Do not leave any items on the stairs.  Make sure that there are handrails on both sides of the stairs. Fix handrails that are broken or loose. Make sure that handrails are as long as the stairways.  Check any carpeting to make sure that it is firmly attached to the stairs. Fix any carpet that is loose or worn.  Avoid having throw rugs at the top or bottom of stairways, or secure the rugs with carpet tape to prevent them from moving.  Make sure that you have a light switch at the top of the stairs and the bottom of the stairs. If you do not have them, have them installed. WHAT ARE SOME OTHER FALL PREVENTION TIPS?  Wear closed-toe shoes that fit well and support your feet. Wear shoes that have rubber soles or low heels.  When you use a stepladder, make sure that it is completely opened and that the sides are firmly locked. Have someone hold the ladder while you   are using it. Do not climb a closed stepladder.  Add color or contrast paint or tape to grab bars and handrails in your home. Place contrasting color strips on the first and last steps.  Use mobility aids as needed, such as canes, walkers, scooters, and crutches.  Turn on lights if it is dark. Replace any light bulbs that burn out.  Set up furniture so that there are clear paths. Keep the furniture in the same spot.  Fix any uneven floor surfaces.  Choose a carpet design that does not hide the edge of steps of a stairway.  Be aware of any and all pets.  Review your medicines with your healthcare provider. Some medicines can cause dizziness or changes in blood pressure, which increase your risk of falling. Talk  with your health care provider about other ways that you can decrease your risk of falls. This may include working with a physical therapist or trainer to improve your strength, balance, and endurance.   This information is not intended to replace advice given to you by your health care provider. Make sure you discuss any questions you have with your health care provider.   Document Released: 02/22/2002 Document Revised: 07/19/2014 Document Reviewed: 04/08/2014 Elsevier Interactive Patient Education 2016 [employer] Inc.   Hip Abduction / Adduction: with Knee Flexion (Supine)    Lie on back with both knee bent, so feet are flat. Tie red band around knees (slightly above knees). Bring both knees out to the side and then back together.  Repeat __10__ times per set. Do __2__ sets per session. Do __3-4__ sessions per week.  http://orth.exer.us/683   Copyright  VHI. All rights reserved.   Bridge    Lie back, legs bent. Tuck in tummy and then press hips up and hold for 2 seconds. Keeping ribs in, lengthen lower back. Then slowly lower back to mat. Repeat __10__ times. Perform 2 sets per session. Do __3-4__ sessions every week.  http://pm.exer.us/55   Copyright  VHI. All rights reserved.   Functional Quadriceps: Sit to Stand    Sit on edge of chair, feet flat on floor. Stand upright, extending knees fully. Think nose over toes. Repeat _10___ times per set. Do __1__ sets per session. Do __1__ sessions per day.  http://orth.exer.us/735   Copyright  VHI. All rights reserved.   Toe / Heel Raise (Sitting)    Sitting, raise heels, then rock back on heels and raise toes. Repeat __20__ times every day.  Copyright  VHI. All rights reserved.

## 2015-01-19 NOTE — Therapy (Addendum)
[facility] [address] [place], [place], [address] Phone: [phone]   Fax:  [phone]  Physical Therapy Treatment  Patient Details  Name: Alexandria Leach MRN: [id number] Date of Birth: 1950/10/19 Referring Provider: Dr. Danise Mina  Encounter Date: 01/19/2015      PT End of Session - 01/19/15 1451    Visit Number 3   Number of Visits 17   Date for PT Re-Evaluation 03/13/15   Authorization Type G-code every 10th visit. Pt reported she also has [place] (secondary) but understands Medicaid will not cover what Medicare does not cover. Thus, pt responsible for out-of-pocket expense. PT will decr. frequency if pt cannot affor 2x/week.   PT Start Time 1401   PT Stop Time 1444   PT Time Calculation (min) 43 min   Equipment Utilized During Treatment Gait belt   Activity Tolerance Patient tolerated treatment well   Behavior During Therapy WFL for tasks assessed/performed      Past Medical History  Diagnosis Date  . Depression   . HTN (hypertension)   . Rheumatic heart disease 1980    s/p mitral valve repair 1980  . HLD (hyperlipidemia)   . Insomnia   . Anxiety   . History of DVT (deep vein thrombosis) several times latest 2012    receives coumadin while hospitalized  . History of pulmonary embolism 2001, 2006    completed coumadin courses  . Fibromyalgia   . Osteoarthritis     shoulders and knees, not RA per Dr Estanislado Pandy, positive ANA, positive Ro  . Personal history of urinary calculi latest 2014  . PONV (postoperative nausea and vomiting)   . Refusal of blood transfusions as patient is Jehovah's Witness   . Sjogren's syndrome ([place])   . CHF (congestive heart failure) ([place])   . Glaucoma     s/p surgery, sees ophtho Q6 mo  . CKD (chronic kidney disease) stage 3, GFR 30-59 ml/min     saw nephrologist Dr Harden Mo  . History of rheumatic fever x3  . DDD (degenerative disc disease), cervical   . Lung nodule  09/22/2012    RLL - 49mm, stable since 2014. Thought benign.     Past Surgical History  Procedure Laterality Date  . Mitral valve repair  1980    open heart  . Tubal ligation  1980  . Vaginal hysterectomy  1992    for fibroids -- partial, ovaries remain  . Cholecystectomy  11/27/2011    Procedure: LAPAROSCOPIC CHOLECYSTECTOMY WITH INTRAOPERATIVE CHOLANGIOGRAM;  Surgeon: Adin Hector, MD;  Location: [facility];  Service: General;  Laterality: N/A;  laparoscopic cholecystectomy with choleangiogram umbilical hernia repair  . Umbilical hernia repair  11/27/2011    Procedure: HERNIA REPAIR UMBILICAL ADULT;  Surgeon: Adin Hector, MD;  Location: [facility];  Service: General;  Laterality: N/A;  . Breast biopsy Right 2006    benign  . Colonoscopy  07/2014    [facility])    There were no vitals filed for this visit.  Visit Diagnosis:  Weakness of both lower extremities  Abnormality of gait      Subjective Assessment - 01/19/15 1403    Subjective Pt denied falls since last visit.  Pt reported she was a little bit sore after exercises, but felt relief in R hip after performing stretches. Pt reported she didn't feel comfortable placing pillow under feet during balance activities 2/2 fear of falling.    Pertinent History HTN, Cx DDD, PE in 2006, CHF,  mitral valve repair, rheumatic heart disease, fibromyalgia, peripheral neuropahty   Patient Stated Goals "Have my balance back, and not fall". Not be so stiff.   Currently in Pain? Yes   Pain Score 5    Pain Location Hip   Pain Orientation Right   Pain Descriptors / Indicators --  pain   Pain Type Chronic pain   Pain Onset More than a month ago   Pain Frequency Constant   Aggravating Factors  nothing    Pain Relieving Factors nothing       Therex: Pt performed HEP with cues for technique and to improve eccentric control. Pt unable to tolerate sidelying for prolonged periods of time for clamshells to improve hip abd. Strength, so PT had pt  perform B hip abd. In hooklying.  Pt performed heel/toe raises while amb. In // bars with B UE support and S for safety. Cues for upright posture, and improved hip flexion to advance contralateral LE. Not added to HEP, as pt does not have a place at home to use B UE support for safety, PT will continue to work on this activity during PT. Please see pt instructions for details.                          PT Education - 01/19/15 1451    Education provided Yes   Education Details Strengthening HEP and fall prevention handout.   Person(s) Educated Patient   Methods Explanation;Demonstration;Tactile cues;Verbal cues;Handout   Comprehension Returned demonstration;Verbalized understanding          PT Short Term Goals - 01/17/15 1518    PT SHORT TERM GOAL #1   Title Pt will be IND in performing HEP to improve balance, strength and endurance. Target date: 02/09/15.   Status On-going   PT SHORT TERM GOAL #2   Title Pt will amb. 300' over even terrain with LRAD at MOD I level to improve functional mobility. Target date: 02/09/15.   Status On-going   PT SHORT TERM GOAL #3   Title Pt will perform TUG in </=13.5sec. with LRAD to decr. falls risk. Target date: 02/09/15.   Status On-going   PT SHORT TERM GOAL #4   Title Pt will improve gait speed with LRAD to >/=1.65ft/sec. to decr. falls risk. Target date: 02/09/15.   Status On-going   PT SHORT TERM GOAL #5   Title Pt will improve BERG score to >/=48/56 to decr. falls risk. Target date: 02/09/15.   Status On-going           PT Long Term Goals - 01/17/15 1518    PT LONG TERM GOAL #1   Title Pt will verbalize understanding of fall prevention strategies to decr. fall risk. Target date: 03/09/15.   Status On-going   PT LONG TERM GOAL #2   Title Pt will amb. 600' over even/uneven terrain with LRAD at MOD I level to improve functional mobility. Target date: 03/09/15.   Status On-going   PT LONG TERM GOAL #3   Title Pt will  improve gait speed with LRAD to >/=2.70ft/sec to amb. safely the community. Target date: 03/09/15.   Status On-going   PT LONG TERM GOAL #4   Title Pt will report no falls in the last 2 weeks to improve safety. Target date: 03/09/15.   Status On-going   PT LONG TERM GOAL #5   Title Pt will ascend/descend 4 steps, with no handrails, at MOD I level (increased time)  to traverse stairs at home safely. Target date: 03/09/15.   Status On-going   PT LONG TERM GOAL #6   Title Pt will improve BERG score to >/=52/56 to decr. falls risk. Target date: 03/09/15.   Status On-going               Plan - 01/19/15 1452    Clinical Impression Statement PT assessed B hip abd.: 3+/5. Pt tolerated strengthening HEP, as she did not report any incr. in pain. However, pt required B UE support in // bars during walking heel/toe raises so PT did not add this to HEP and had pt perform seated heel/toe raises instead. PT again educated pt that movement is good to improve balance, strength, flexiblity and to reduce pain as pt was worried about incr. pain. Continue with POC.   Pt will benefit from skilled therapeutic intervention in order to improve on the following deficits Abnormal gait;Decreased endurance;Decreased knowledge of use of DME;Decreased strength;Decreased mobility;Decreased balance;Impaired flexibility;Pain   Rehab Potential Good   PT Frequency 2x / week   PT Duration 8 weeks   PT Treatment/Interventions ADLs/Self Care Home Management;Neuromuscular re-education;Vestibular;Manual techniques;Balance training;Therapeutic exercise;Therapeutic activities;Functional mobility training;Electrical Stimulation;Stair training;Gait training;Orthotic Fit/Training;Patient/family education;DME Instruction;Biofeedback   PT Next Visit Plan Dynamic gait/balance training.   PT Home Exercise Plan Stretch/balance/strengthening HEP   Consulted and Agree with Plan of Care Patient        Problem List Patient Active  Problem List   Diagnosis Date Noted  . Contusion of forehead 01/12/2015  . Imbalance 01/12/2015  . Transaminitis 12/24/2014  . DDD (degenerative disc disease), cervical   . Health maintenance examination 10/18/2014  . Advanced care planning/counseling discussion 10/18/2014  . Medicare annual wellness visit, initial 10/18/2014  . Refusal of blood transfusions as patient is Jehovah's Witness   . Sjogren's syndrome ([place])   . CKD (chronic kidney disease) stage 3, GFR 30-59 ml/min   . Glaucoma   . Fibromyalgia   . Osteoarthritis   . History of pulmonary embolism   . Lung nodule 09/22/2012  . Diastolic CHF ([place]) 42/87/6811  . Pneumonia 12/02/2011  . Aortic stenosis 04/22/2011  . Palpitations 08/10/2010  . Dyspnea 08/10/2010  . HOT FLASHES 06/28/2008  . BURSITIS, HIP 06/28/2008  . ARTHRALGIA 07/01/2007  . BACK PAIN, LEFT 08/25/2006  . GAD (generalized anxiety disorder) 03/28/2006  . MITRAL VALVE REPLACEMENT, HX OF 03/28/2006  . TAH/BSO, HX OF 03/28/2006  . HYPERCHOLESTEROLEMIA 12/31/2005  . MDD (major depressive disorder), recurrent episode ([place]) 12/31/2005  . RHEUMATIC HEART DISEASE 12/31/2005  . Essential hypertension 12/31/2005  . Chronic insomnia 12/31/2005    Miller,Jennifer L 01/19/2015, 3:00 PM  [facility] [address] [place], [place], [address] Phone: [phone]   Fax:  [phone]  Name: AMORY ZBIKOWSKI MRN: [id number] Date of Birth: [birth year]    Geoffry Paradise, PT,DPT 01/19/2015 3:00 PM Phone: [phone] Fax: [phone]

## 2015-01-24 ENCOUNTER — Telehealth: Payer: Self-pay

## 2015-01-24 ENCOUNTER — Ambulatory Visit: Payer: Medicare Other

## 2015-01-24 NOTE — Telephone Encounter (Signed)
PT called pt regarding no-show to 01/24/15 (Tuesday) appt. Pt reported she thought today was Monday not Tuesday, and that's why she missed her appt. Pt reported she will be at 01/26/15 PT appt.

## 2015-01-25 ENCOUNTER — Other Ambulatory Visit: Payer: Self-pay | Admitting: Family Medicine

## 2015-01-26 ENCOUNTER — Ambulatory Visit: Payer: Medicare Other

## 2015-01-26 ENCOUNTER — Ambulatory Visit: Payer: Self-pay

## 2015-01-26 DIAGNOSIS — M797 Fibromyalgia: Secondary | ICD-10-CM | POA: Diagnosis not present

## 2015-01-26 DIAGNOSIS — M3500 Sicca syndrome, unspecified: Secondary | ICD-10-CM | POA: Diagnosis not present

## 2015-01-26 DIAGNOSIS — R269 Unspecified abnormalities of gait and mobility: Secondary | ICD-10-CM

## 2015-01-26 DIAGNOSIS — Z79899 Other long term (current) drug therapy: Secondary | ICD-10-CM | POA: Diagnosis not present

## 2015-01-26 DIAGNOSIS — R2681 Unsteadiness on feet: Secondary | ICD-10-CM

## 2015-01-26 DIAGNOSIS — R29898 Other symptoms and signs involving the musculoskeletal system: Secondary | ICD-10-CM | POA: Diagnosis not present

## 2015-01-26 DIAGNOSIS — R5383 Other fatigue: Secondary | ICD-10-CM | POA: Diagnosis not present

## 2015-01-26 NOTE — Therapy (Signed)
[facility] [address] [place], [place], [address] Phone: [phone]   Fax:  [phone]  Physical Therapy Treatment  Patient Details  Name: Alexandria Leach MRN: [id number] Date of Birth: 06/26/1950 Referring Provider: Dr. Danise Mina  Encounter Date: 01/26/2015      PT End of Session - 01/26/15 1236    Visit Number 4   Number of Visits 17   Date for PT Re-Evaluation 03/13/15   Authorization Type G-code every 10th visit. Pt reported she also has Medicaid (secondary) but understands Medicaid will not cover what Medicare does not cover. Thus, pt responsible for out-of-pocket expense. PT will decr. frequency if pt cannot affor 2x/week.   PT Start Time [phone]   PT Stop Time 0928   PT Time Calculation (min) 41 min   Equipment Utilized During Treatment Gait belt   Activity Tolerance Patient tolerated treatment well   Behavior During Therapy WFL for tasks assessed/performed      Past Medical History  Diagnosis Date  . Depression   . HTN (hypertension)   . Rheumatic heart disease 1980    s/p mitral valve repair 1980  . HLD (hyperlipidemia)   . Insomnia   . Anxiety   . History of DVT (deep vein thrombosis) several times latest 2012    receives coumadin while hospitalized  . History of pulmonary embolism 2001, 2006    completed coumadin courses  . Fibromyalgia   . Osteoarthritis     shoulders and knees, not RA per Dr Estanislado Pandy, positive ANA, positive Ro  . Personal history of urinary calculi latest 2014  . PONV (postoperative nausea and vomiting)   . Refusal of blood transfusions as patient is Jehovah's Witness   . Sjogren's syndrome ([place])   . CHF (congestive heart failure) ([place])   . Glaucoma     s/p surgery, sees ophtho Q6 mo  . CKD (chronic kidney disease) stage 3, GFR 30-59 ml/min     saw nephrologist Dr Harden Mo  . History of rheumatic fever x3  . DDD (degenerative disc disease), cervical   . Lung nodule  09/22/2012    RLL - 62m, stable since 2014. Thought benign.     Past Surgical History  Procedure Laterality Date  . Mitral valve repair  1980    open heart  . Tubal ligation  1980  . Vaginal hysterectomy  1992    for fibroids -- partial, ovaries remain  . Cholecystectomy  11/27/2011    Procedure: LAPAROSCOPIC CHOLECYSTECTOMY WITH INTRAOPERATIVE CHOLANGIOGRAM;  Surgeon: HAdin Hector MD;  [facility]  Service: General;  Laterality: N/A;  laparoscopic cholecystectomy with choleangiogram umbilical hernia repair  . Umbilical hernia repair  11/27/2011    Procedure: HERNIA REPAIR UMBILICAL ADULT;  Surgeon: HAdin Hector MD;  [facility]  Service: General;  Laterality: N/A;  . Breast biopsy Right 2006    benign  . Colonoscopy  07/2014    WNL (Amedeo Plenty    There were no vitals filed for this visit.  Visit Diagnosis:  Abnormality of gait  Unsteadiness  Weakness of both lower extremities      Subjective Assessment - 01/26/15 0849    Subjective Pt denied falls since last visit. Pt reported she was able to perform HEP from last time with no problem. Pt feels like her balance is getting better, as she is able to amb. in the house [facility]   Pertinent History HTN, Cx DDD, PE in 2006, CHF, mitral valve  repair, rheumatic heart disease, fibromyalgia, peripheral neuropahty   Patient Stated Goals "Have my balance back, and not fall". Not be so stiff.   Currently in Pain? No/denies                         [facility] Adult PT Treatment/Exercise - 01/26/15 5726    Ambulation/Gait   Ambulation/Gait Yes   Ambulation/Gait Assistance 5: Supervision;4: Min guard   Ambulation/Gait Assistance Details Pt amb. while performing head turns, and amb. at various speeds. No overt LOB noted, but min guard to ensure safety. Pt noted to amb. with narrow BOS during head turns, which improved with cues and after performing high level balance activities. Cues for improve stride length  and heel strike, gait more fluid without SPC.    Ambulation Distance (Feet) --  345', 115'   Assistive device None   Gait Pattern Step-through pattern;Decreased stride length;Decreased dorsiflexion - right;Decreased hip/knee flexion - right;Decreased trunk rotation   Ambulation Surface Level;Indoor   Gait velocity 2.67f/sec. with SPC and 3.049fsec. without SPC   High Level Balance   High Level Balance Activities Backward walking;Head turns;Other (comment);Side stepping , braiding. walking with eyes closed and open, cone taps   High Level Balance Comments All performed on red mat with min guard and min A (during 1 LOB episode). Walking performed 4x7'/activity and cone taps with B LEs (2x5 cones/activity): single, double and triple taps. Cues to improve lat. wt. shifting, stride length and heel strike.                PT Education - 01/26/15 1236    Education provided Yes   Education Details PT discussed pt meeting gait speed STG and LTGs   Person(s) Educated Patient   Methods Explanation   Comprehension Verbalized understanding          PT Short Term Goals - 01/26/15 1238    PT SHORT TERM GOAL #1   Title Pt will be IND in performing HEP to improve balance, strength and endurance. Target date: 02/09/15.   Status On-going   PT SHORT TERM GOAL #2   Title Pt will amb. 300' over even terrain with LRAD at MOD I level to improve functional mobility. Target date: 02/09/15.   Status On-going   PT SHORT TERM GOAL #3   Title Pt will perform TUG in </=13.5sec. with LRAD to decr. falls risk. Target date: 02/09/15.   Status On-going   PT SHORT TERM GOAL #4   Title Pt will improve gait speed with LRAD to >/=1.77f74fec. to decr. falls risk. Target date: 02/09/15.   Status Achieved   PT SHORT TERM GOAL #5   Title Pt will improve BERG score to >/=48/56 to decr. falls risk. Target date: 02/09/15.   Status On-going           PT Long Term Goals - 01/26/15 1238    PT LONG TERM GOAL  #1   Title Pt will verbalize understanding of fall prevention strategies to decr. fall risk. Target date: 03/09/15.   Status On-going   PT LONG TERM GOAL #2   Title Pt will amb. 600' over even/uneven terrain with LRAD at MOD I level to improve functional mobility. Target date: 03/09/15.   Status On-going   PT LONG TERM GOAL #3   Title Pt will improve gait speed with LRAD to >/=2.36f12fc to amb. safely the community. Target date: 03/09/15.   Baseline met without AD, but [facility]  Status Partially Met   PT LONG TERM GOAL #4   Title Pt will report no falls in the last 2 weeks to improve safety. Target date: 03/09/15.   Status On-going   PT LONG TERM GOAL #5   Title Pt will ascend/descend 4 steps, with no handrails, at MOD I level (increased time) to traverse stairs at home safely. Target date: 03/09/15.   Status On-going   PT LONG TERM GOAL #6   Title Pt will improve BERG score to >/=52/56 to decr. falls risk. Target date: 03/09/15.   Status On-going               Plan - 01/26/15 1237    Clinical Impression Statement Pt demonstrated progress as she met STG 4 and partially met LTG 3. Pt continues to require rest breaks during session 2/2 fatigue and leg "soreness". However, pt requiring less rest breaks indicating incr. endurance. Continue with POC.   Pt will benefit from skilled therapeutic intervention in order to improve on the following deficits Abnormal gait;Decreased endurance;Decreased knowledge of use of DME;Decreased strength;Decreased mobility;Decreased balance;Impaired flexibility;Pain   Rehab Potential Good   PT Frequency 2x / week   PT Duration 8 weeks   PT Treatment/Interventions ADLs/Self Care Home Management;Neuromuscular re-education;Vestibular;Manual techniques;Balance training;Therapeutic exercise;Therapeutic activities;Functional mobility training;Electrical Stimulation;Stair training;Gait training;Orthotic Fit/Training;Patient/family education;DME  Instruction;Biofeedback   PT Next Visit Plan Dynamic gait/balance training.   PT Home Exercise Plan Stretch/balance/strengthening HEP   Consulted and Agree with Plan of Care Patient        Problem List Patient Active Problem List   Diagnosis Date Noted  . Contusion of forehead 01/12/2015  . Imbalance 01/12/2015  . Transaminitis 12/24/2014  . DDD (degenerative disc disease), cervical   . Health maintenance examination 10/18/2014  . Advanced care planning/counseling discussion 10/18/2014  . Medicare annual wellness visit, initial 10/18/2014  . Refusal of blood transfusions as patient is Jehovah's Witness   . Sjogren's syndrome ([place])   . CKD (chronic kidney disease) stage 3, GFR 30-59 ml/min   . Glaucoma   . Fibromyalgia   . Osteoarthritis   . History of pulmonary embolism   . Lung nodule 09/22/2012  . Diastolic CHF ([place]) 98/33/8250  . Pneumonia 12/02/2011  . Aortic stenosis 04/22/2011  . Palpitations 08/10/2010  . Dyspnea 08/10/2010  . HOT FLASHES 06/28/2008  . BURSITIS, HIP 06/28/2008  . ARTHRALGIA 07/01/2007  . BACK PAIN, LEFT 08/25/2006  . GAD (generalized anxiety disorder) 03/28/2006  . MITRAL VALVE REPLACEMENT, HX OF 03/28/2006  . TAH/BSO, HX OF 03/28/2006  . HYPERCHOLESTEROLEMIA 12/31/2005  . MDD (major depressive disorder), recurrent episode ([place]) 12/31/2005  . RHEUMATIC HEART DISEASE 12/31/2005  . Essential hypertension 12/31/2005  . Chronic insomnia 12/31/2005    Pernie Grosso L 01/26/2015, 12:40 PM  [facility] [address] [place], [place], [address] Phone: [phone]   Fax:  [phone]  Name: Alexandria Leach MRN: [id number] Date of Birth: Oct 20, 1950    Geoffry Paradise, PT,DPT 01/26/2015 12:40 PM Phone: [phone] Fax: [phone]

## 2015-01-31 ENCOUNTER — Ambulatory Visit: Payer: Medicare Other

## 2015-01-31 DIAGNOSIS — R269 Unspecified abnormalities of gait and mobility: Secondary | ICD-10-CM | POA: Diagnosis not present

## 2015-01-31 DIAGNOSIS — R2681 Unsteadiness on feet: Secondary | ICD-10-CM

## 2015-01-31 DIAGNOSIS — R29898 Other symptoms and signs involving the musculoskeletal system: Secondary | ICD-10-CM | POA: Diagnosis not present

## 2015-01-31 NOTE — Therapy (Signed)
[facility] [address] [place], [place], [address] Phone: [phone]   Fax:  [phone]  Physical Therapy Treatment  Patient Details  Name: Alexandria Leach MRN: [id number] Date of Birth: December 20, 1950 Referring Provider: Dr. Danise Mina  Encounter Date: 01/31/2015      PT End of Session - 01/31/15 1612    Visit Number 5   Number of Visits 17   Date for PT Re-Evaluation 03/13/15   Authorization Type G-code every 10th visit. Pt reported she also has Medicaid (secondary) but understands Medicaid will not cover what Medicare does not cover. Thus, pt responsible for out-of-pocket expense. PT will decr. frequency if pt cannot affor 2x/week.   PT Start Time 1402   PT Stop Time 1442   PT Time Calculation (min) 40 min   Equipment Utilized During Treatment Gait belt   Activity Tolerance Patient tolerated treatment well   Behavior During Therapy WFL for tasks assessed/performed      Past Medical History  Diagnosis Date  . Depression   . HTN (hypertension)   . Rheumatic heart disease 1980    s/p mitral valve repair 1980  . HLD (hyperlipidemia)   . Insomnia   . Anxiety   . History of DVT (deep vein thrombosis) several times latest 2012    receives coumadin while hospitalized  . History of pulmonary embolism 2001, 2006    completed coumadin courses  . Fibromyalgia   . Osteoarthritis     shoulders and knees, not RA per Dr Estanislado Pandy, positive ANA, positive Ro  . Personal history of urinary calculi latest 2014  . PONV (postoperative nausea and vomiting)   . Refusal of blood transfusions as patient is Jehovah's Witness   . Sjogren's syndrome ([place])   . CHF (congestive heart failure) ([place])   . Glaucoma     s/p surgery, sees ophtho Q6 mo  . CKD (chronic kidney disease) stage 3, GFR 30-59 ml/min     saw nephrologist Dr Harden Mo  . History of rheumatic fever x3  . DDD (degenerative disc disease), cervical   . Lung nodule  09/22/2012    RLL - 11m, stable since 2014. Thought benign.     Past Surgical History  Procedure Laterality Date  . Mitral valve repair  1980    open heart  . Tubal ligation  1980  . Vaginal hysterectomy  1992    for fibroids -- partial, ovaries remain  . Cholecystectomy  11/27/2011    Procedure: LAPAROSCOPIC CHOLECYSTECTOMY WITH INTRAOPERATIVE CHOLANGIOGRAM;  Surgeon: HAdin Hector MD;  [facility]  Service: General;  Laterality: N/A;  laparoscopic cholecystectomy with choleangiogram umbilical hernia repair  . Umbilical hernia repair  11/27/2011    Procedure: HERNIA REPAIR UMBILICAL ADULT;  Surgeon: HAdin Hector MD;  [facility]  Service: General;  Laterality: N/A;  . Breast biopsy Right 2006    benign  . Colonoscopy  07/2014    WNL (Amedeo Plenty    There were no vitals filed for this visit.  Visit Diagnosis:  Abnormality of gait  Unsteadiness      Subjective Assessment - 01/31/15 1404    Subjective Pt denied falls or changes since last session. Pt [place] [place] stated she feels good. Pt reported HEP is going well.   Pertinent History HTN, Cx DDD, PE in 2006, CHF, mitral valve repair, rheumatic heart disease, fibromyalgia, peripheral neuropahty   Patient Stated Goals "Have my balance back, and not fall". Not be so stiff.  Currently in Pain? No/denies                         [facility] Adult PT Treatment/Exercise - 01/31/15 1406    Ambulation/Gait   Ambulation/Gait Yes   Ambulation/Gait Assistance 5: Supervision;7: Independent   Ambulation/Gait Assistance Details Pt amb. over even/uneven terrain without overt LOB episode. Cues to improve R heel strike after amb. approx. 600' and cues to improve reciprocal arm swing.   Ambulation Distance (Feet) 1000 Feet   Assistive device None   Gait Pattern Step-through pattern;Decreased stride length;Decreased trunk rotation;Decreased arm swing - right;Decreased arm swing - left;Decreased dorsiflexion -  right   Ambulation Surface Level;Unlevel;Indoor;Outdoor;Paved;Grass   High Level Balance   High Level Balance Activities Negotiating over obstacles;Negotitating around obstacles   High Level Balance Comments Performed x4 over each terrain. Over paved and grassy surfaces pt negotiated small and tall hurdles, min A to maintain balance during 1 LOB episode.  Cues and demonstration to improve lateral wt. shifting. Pt progressed to performing without looking down at hurdles to improve proprioception.              Balance Exercises - 01/31/15 1444    Balance Exercises: Standing   Rockerboard Other (comment)  in // bars on blue beam: ball catch/toss   Tandem Gait Foam/compliant surface  4x7' on blue beam   Sidestepping Foam/compliant support  4x7' sidestepping with mini-squat in // bars.   Other Standing Exercises All balance performed in // bars with min guard to min A to maintain balance. Pt performed ball toss/catch x30 with lightwt. ball and x20 with 1.1lb. ball. Cues to improve wt. shifting and technique.  Pt also performed B LE cone taps in rubber mulch with min guard to min A with cues to improve lat. wt. shifting and technique.           PT Education - 01/31/15 1612    Education provided Yes   Education Details PT discussed assessing STG/LTGs with potential d/c next week based on goal progress.   Person(s) Educated Patient   Methods Explanation   Comprehension Verbalized understanding          PT Short Term Goals - 01/26/15 1238    PT SHORT TERM GOAL #1   Title Pt will be IND in performing HEP to improve balance, strength and endurance. Target date: 02/09/15.   Status On-going   PT SHORT TERM GOAL #2   Title Pt will amb. 300' over even terrain with LRAD at MOD I level to improve functional mobility. Target date: 02/09/15.   Status On-going   PT SHORT TERM GOAL #3   Title Pt will perform TUG in </=13.5sec. with LRAD to decr. falls risk. Target date: 02/09/15.   Status  On-going   PT SHORT TERM GOAL #4   Title Pt will improve gait speed with LRAD to >/=1.34f/sec. to decr. falls risk. Target date: 02/09/15.   Status Achieved   PT SHORT TERM GOAL #5   Title Pt will improve BERG score to >/=48/56 to decr. falls risk. Target date: 02/09/15.   Status On-going           PT Long Term Goals - 01/26/15 1238    PT LONG TERM GOAL #1   Title Pt will verbalize understanding of fall prevention strategies to decr. fall risk. Target date: 03/09/15.   Status On-going   PT LONG TERM GOAL #2   Title Pt will amb. 600' over even/uneven  terrain with LRAD at MOD I level to improve functional mobility. Target date: 03/09/15.   Status On-going   PT LONG TERM GOAL #3   Title Pt will improve gait speed with LRAD to >/=2.49f/sec to amb. safely the community. Target date: 03/09/15.   Baseline met without AD, but partially met with SPC   Status Partially Met   PT LONG TERM GOAL #4   Title Pt will report no falls in the last 2 weeks to improve safety. Target date: 03/09/15.   Status On-going   PT LONG TERM GOAL #5   Title Pt will ascend/descend 4 steps, with no handrails, at MOD I level (increased time) to traverse stairs at home safely. Target date: 03/09/15.   Status On-going   PT LONG TERM GOAL #6   Title Pt will improve BERG score to >/=52/56 to decr. falls risk. Target date: 03/09/15.   Status On-going               Plan - 01/31/15 1613    Clinical Impression Statement Pt demonstrated progress as she was able to tolerate high level balance activities and gait over uneven terrain without requiring rest break. Pt continues to experience incr. postural sway and LOB during activities over complian/moving surfaces. Continue with POC.   Pt will benefit from skilled therapeutic intervention in order to improve on the following deficits Abnormal gait;Decreased endurance;Decreased knowledge of use of DME;Decreased strength;Decreased mobility;Decreased balance;Impaired  flexibility;Pain   Rehab Potential Good   PT Frequency 2x / week   PT Duration 8 weeks   PT Treatment/Interventions ADLs/Self Care Home Management;Neuromuscular re-education;Vestibular;Manual techniques;Balance training;Therapeutic exercise;Therapeutic activities;Functional mobility training;Electrical Stimulation;Stair training;Gait training;Orthotic Fit/Training;Patient/family education;DME Instruction;Biofeedback   PT Next Visit Plan Begin to assess STG/LTGs and decide best POC.   PT Home Exercise Plan Stretch/balance/strengthening HEP   Consulted and Agree with Plan of Care Patient        Problem List Patient Active Problem List   Diagnosis Date Noted  . Contusion of forehead 01/12/2015  . Imbalance 01/12/2015  . Transaminitis 12/24/2014  . DDD (degenerative disc disease), cervical   . Health maintenance examination 10/18/2014  . Advanced care planning/counseling discussion 10/18/2014  . Medicare annual wellness visit, initial 10/18/2014  . Refusal of blood transfusions as patient is Jehovah's Witness   . S[place]ogren's syndrome ([place]   . CKD (chronic kidney disease) stage 3, GFR 30-59 ml/min   . Glaucoma   . Fibromyalgia   . Osteoarthritis   . History of pulmonary embolism   . Lung nodule 09/22/2012  . Diastolic CHF ([place] 066/08/43 . Pneumonia 12/02/2011  . Aortic stenosis 04/22/2011  . Palpitations 08/10/2010  . Dyspnea 08/10/2010  . HOT FLASHES 06/28/2008  . BURSITIS, HIP 06/28/2008  . ARTHRALGIA 07/01/2007  . BACK PAIN, LEFT 08/25/2006  . GAD (generalized anxiety disorder) 03/28/2006  . MITRAL VALVE REPLACEMENT, HX OF 03/28/2006  . TAH/BSO, HX OF 03/28/2006  . HYPERCHOLESTEROLEMIA 12/31/2005  . MDD (major depressive disorder), recurrent episode ([place] 12/31/2005  . RHEUMATIC HEART DISEASE 12/31/2005  . Essential hypertension 12/31/2005  . Chronic insomnia 12/31/2005    Khyri Hinzman L 01/31/2015, 4:20 PM  [facility]SD[place]villeG[place] [address]Phone: [phone]  Fax:  [phone] Name: JIGNACIA GENTZLERMRN: [id number] of Birth: [birth year]   JGeoffry Paradise PT,DPT 01/31/2015 4:20 PM Phone: [phone]: [phone]

## 2015-02-02 ENCOUNTER — Ambulatory Visit: Payer: Medicare Other | Admitting: Physical Therapy

## 2015-02-02 DIAGNOSIS — R269 Unspecified abnormalities of gait and mobility: Secondary | ICD-10-CM | POA: Diagnosis not present

## 2015-02-02 DIAGNOSIS — R29898 Other symptoms and signs involving the musculoskeletal system: Secondary | ICD-10-CM | POA: Diagnosis not present

## 2015-02-02 DIAGNOSIS — R2681 Unsteadiness on feet: Secondary | ICD-10-CM | POA: Diagnosis not present

## 2015-02-02 NOTE — Therapy (Signed)
[facility] [address] [place], [place], [address] Phone: [phone]   Fax:  [phone]  Physical Therapy Treatment  Patient Details  Name: Alexandria Leach MRN: [id number] Date of Birth: [birth year] Referring Provider: Dr. Danise Mina  Encounter Date: 02/02/2015      PT End of Session - 02/02/15 1449    Visit Number 6   Number of Visits 17   Date for PT Re-Evaluation 03/13/15   Authorization Type G-code every 10th visit. Pt reported she also has Medicaid (secondary) but understands Medicaid will not cover what Medicare does not cover. Thus, pt responsible for out-of-pocket expense. PT will decr. frequency if pt cannot affor 2x/week.   PT Start Time 1407   PT Stop Time 1438   PT Time Calculation (min) 31 min   Activity Tolerance Patient tolerated treatment well   Behavior During Therapy WFL for tasks assessed/performed      Past Medical History  Diagnosis Date  . Depression   . HTN (hypertension)   . Rheumatic heart disease 1980    s/p mitral valve repair 1980  . HLD (hyperlipidemia)   . Insomnia   . Anxiety   . History of DVT (deep vein thrombosis) several times latest 2012    receives coumadin while hospitalized  . History of pulmonary embolism 2001, 2006    completed coumadin courses  . Fibromyalgia   . Osteoarthritis     shoulders and knees, not RA per Dr Estanislado Pandy, positive ANA, positive Ro  . Personal history of urinary calculi latest 2014  . PONV (postoperative nausea and vomiting)   . Refusal of blood transfusions as patient is Jehovah's Witness   . Sjogren's syndrome ([place])   . CHF (congestive heart failure) ([place])   . Glaucoma     s/p surgery, sees ophtho Q6 mo  . CKD (chronic kidney disease) stage 3, GFR 30-59 ml/min     saw nephrologist Dr Harden Mo  . History of rheumatic fever x3  . DDD (degenerative disc disease), cervical   . Lung nodule 09/22/2012    RLL - 79m, stable since 2014. Thought  benign.     Past Surgical History  Procedure Laterality Date  . Mitral valve repair  1980    open heart  . Tubal ligation  1980  . Vaginal hysterectomy  1992    for fibroids -- partial, ovaries remain  . Cholecystectomy  11/27/2011    Procedure: LAPAROSCOPIC CHOLECYSTECTOMY WITH INTRAOPERATIVE CHOLANGIOGRAM;  Surgeon: HAdin Hector MD;  [facility]  Service: General;  Laterality: N/A;  laparoscopic cholecystectomy with choleangiogram umbilical hernia repair  . Umbilical hernia repair  11/27/2011    Procedure: HERNIA REPAIR UMBILICAL ADULT;  Surgeon: HAdin Hector MD;  [facility]  Service: General;  Laterality: N/A;  . Breast biopsy Right 2006    benign  . Colonoscopy  07/2014    WNL (Amedeo Plenty    There were no vitals filed for this visit.  Visit Diagnosis:  Abnormality of gait      Subjective Assessment - 02/02/15 1411    Subjective Pt denies falls or changes.  "Very well".   Pertinent History HTN, Cx DDD, PE in 2006, CHF, mitral valve repair, rheumatic heart disease, fibromyalgia, peripheral neuropahty   Patient Stated Goals "Have my balance back, and not fall". Not be so stiff.   Currently in Pain? No/denies  [facility] Adult PT Treatment/Exercise - 02/02/15 1415    Transfers   Transfers Sit to Stand;Stand to Sit   Sit to Stand 7: Independent   Stand to Sit 7: Independent   Ambulation/Gait   Ambulation/Gait Yes   Ambulation/Gait Assistance 7: Independent   Ambulation Distance (Feet) 1000 Feet  plus   Assistive device None   Gait Pattern Step-through pattern;Decreased trunk rotation   Ambulation Surface Level;Unlevel;Indoor;Outdoor;Paved;Grass   Gait velocity 7.71 seconds no device=4.25 ft/second   Stairs Yes   Stairs Assistance 7: Independent   Stair Management Technique No rails;Alternating pattern;Forwards   Number of Stairs 4   Height of Stairs 6   Berg Balance Test   Sit to Stand Able to stand without using  hands and stabilize independently   Standing Unsupported Able to stand safely 2 minutes   Sitting with Back Unsupported but Feet Supported on Floor or Stool Able to sit safely and securely 2 minutes   Stand to Sit Sits safely with minimal use of hands   Transfers Able to transfer safely, minor use of hands   Standing Unsupported with Eyes Closed Able to stand 10 seconds safely   Standing Ubsupported with Feet Together Able to place feet together independently and stand 1 minute safely   From Standing, Reach Forward with Outstretched Arm Can reach confidently >25 cm (10")   From Standing Position, Pick up Object from Floor Able to pick up shoe safely and easily   From Standing Position, Turn to Look Behind Over each Shoulder Looks behind from both sides and weight shifts well   Turn 360 Degrees Able to turn 360 degrees safely in 4 seconds or less   Standing Unsupported, Alternately Place Feet on Step/Stool Able to stand independently and safely and complete 8 steps in 20 seconds   Standing Unsupported, One Foot in Front Able to place foot tandem independently and hold 30 seconds   Standing on One Leg Able to lift leg independently and hold > 10 seconds   Total Score 56   Timed Up and Go Test   TUG Normal TUG   Normal TUG (seconds) 10.9                PT Education - 02/02/15 1447    Education provided Yes   Education Details All goals met, d/c   Person(s) Educated Patient   Methods Explanation   Comprehension Verbalized understanding          PT Short Term Goals - 02/02/15 1448    PT SHORT TERM GOAL #1   Title Pt will be IND in performing HEP to improve balance, strength and endurance. Target date: 02/09/15.   Status Achieved   PT SHORT TERM GOAL #2   Title Pt will amb. 300' over even terrain with LRAD at MOD I level to improve functional mobility. Target date: 02/09/15.   Status Achieved   PT SHORT TERM GOAL #3   Title Pt will perform TUG in </=13.5sec. with LRAD to  decr. falls risk. Target date: 02/09/15.   Baseline 10.9 seconds   Status Achieved   PT SHORT TERM GOAL #4   Title Pt will improve gait speed with LRAD to >/=1.49f/sec. to decr. falls risk. Target date: 02/09/15.   Baseline 4.25 ft/sec   Status Achieved   PT SHORT TERM GOAL #5   Title Pt will improve BERG score to >/=48/56 to decr. falls risk. Target date: 02/09/15.   Baseline 56/56   Status Achieved  PT Long Term Goals - 02/05/2015 1448    PT LONG TERM GOAL #1   Title Pt will verbalize understanding of fall prevention strategies to decr. fall risk. Target date: 03/09/15.   Status Achieved   PT LONG TERM GOAL #2   Title Pt will amb. 600' over even/uneven terrain with LRAD at MOD I level to improve functional mobility. Target date: 03/09/15.   Baseline over 1000' no LOB no device   Status Achieved   PT LONG TERM GOAL #3   Title Pt will improve gait speed with LRAD to >/=2.36f/sec to amb. safely the community. Target date: 03/09/15.   Baseline met without AD, but [facility]  Status Achieved   PT LONG TERM GOAL #4   Title Pt will report no falls in the last 2 weeks to improve safety. Target date: 03/09/15.   Status Achieved   PT LONG TERM GOAL #5   Title Pt will ascend/descend 4 steps, with no handrails, at MOD I level (increased time) to traverse stairs at home safely. Target date: 03/09/15.   Status Achieved   PT LONG TERM GOAL #6   Title Pt will improve BERG score to >/=52/56 to decr. falls risk. Target date: 03/09/15.   Status Achieved               Plan - 1November 20, 20161450    Clinical Impression Statement Pt met all STG's and LTG's.  Denies falls in last 2 weeks and no pain.  Not using cane to ambulate anymore.  Independent with HEP.  States no further questions/concerns for PT.  Discharge per JGeoffry Paradise PT.   PT Next Visit Plan Discharge from PT   PT HW[place]ropand Agree with Plan of  Care Patient       Problem List Patient Active Problem List   Diagnosis Date Noted  . Contusion of forehead 01/12/2015  . Imbalance 01/12/2015  . Transaminitis 12/24/2014  . DDD (degenerative disc disease), cervical   . Health maintenance examination 10/18/2014  . Advanced care planning/counseling discussion 10/18/2014  . Medicare annual wellness visit, initial 10/18/2014  . Refusal of blood transfusions as patient is Jehovah's Witness   . S[place]'s syndrome ([place]   . CKD (chronic kidney disease) stage 3, GFR 30-59 ml/min   . Glaucoma   . Fibromyalgia   . Osteoarthritis   . History of pulmonary embolism   . Lung nodule 09/22/2012  . Diastolic CHF ([place] [id number] . Pneumonia 12/02/2011  . Aortic stenosis 04/22/2011  . Palpitations 08/10/2010  . Dyspnea 08/10/2010  . HOT FLASHES 06/28/2008  . BURSITIS, HIP 06/28/2008  . ARTHRALGIA 07/01/2007  . BACK PAIN, LEFT 08/25/2006  . GAD (generalized anxiety disorder) 03/28/2006  . MITRAL VALVE REPLACEMENT, HX OF 03/28/2006  . TAH/BSO, HX OF 03/28/2006  . HYPERCHOLESTEROLEMIA 12/31/2005  . MDD (major depressive disorder), recurrent episode ([place] 12/31/2005  . RHEUMATIC HEART DISEASE 12/31/2005  . Essential hypertension 12/31/2005  . Chronic insomnia 12/31/2005    RNarda Bonds12016/11/20 2:52 PM  [facility]SCanadianGDardanelle [place] [address]Phone: [phone]  Fax:  [phone] Name: Alexandria KIRKESMRN: [id number] of Birth: [birth year]     G-Codes - 1November 20, 20161451    Functional Assessment Tool Used BERG 56/56, TUG no device 10.9 sec, gait speed no device 4.25 ft/sec   Functional Limitation Mobility: Walking and moving around   Mobility:  Walking and Moving Around Goal Status [phone]) At least 1 percent but less than 20 percent impaired, limited or restricted   Mobility: Walking and Moving Around Discharge Status [phone]) 0 percent  impaired, limited or restricted        Narda Bonds, [place] 02/02/2015 2:53 PM Phone: [phone] Fax: [phone]  PHYSICAL THERAPY DISCHARGE SUMMARY  Visits from Start of Care: 6  Current functional level related to goals / functional outcomes:     PT Long Term Goals - 02/02/15 1448    PT LONG TERM GOAL #1   Title Pt will verbalize understanding of fall prevention strategies to decr. fall risk. Target date: 03/09/15.   Status Achieved   PT LONG TERM GOAL #2   Title Pt will amb. 600' over even/uneven terrain with LRAD at MOD I level to improve functional mobility. Target date: 03/09/15.   Baseline over 1000' no LOB no device   Status Achieved   PT LONG TERM GOAL #3   Title Pt will improve gait speed with LRAD to >/=2.77f/sec to amb. safely the community. Target date: 03/09/15.   Baseline met without AD, but [facility]  Status Achieved   PT LONG TERM GOAL #4   Title Pt will report no falls in the last 2 weeks to improve safety. Target date: 03/09/15.   Status Achieved   PT LONG TERM GOAL #5   Title Pt will ascend/descend 4 steps, with no handrails, at MOD I level (increased time) to traverse stairs at home safely. Target date: 03/09/15.   Status Achieved   PT LONG TERM GOAL #6   Title Pt will improve BERG score to >/=52/56 to decr. falls risk. Target date: 03/09/15.   Status Achieved        Remaining deficits: none   Education / Equipment: HEP  Plan: Patient agrees to discharge.  Patient goals were met. Patient is being discharged due to meeting the stated rehab goals.  ?????       G-code and d/c summary completed by PT. JGeoffry Paradise PT,DPT 02/02/2015 4:08 PM Phone: [phone]: [phone]

## 2015-02-03 ENCOUNTER — Encounter (HOSPITAL_COMMUNITY): Payer: Self-pay | Admitting: Psychiatry

## 2015-02-03 ENCOUNTER — Ambulatory Visit (INDEPENDENT_AMBULATORY_CARE_PROVIDER_SITE_OTHER): Payer: Medicare Other | Admitting: Psychiatry

## 2015-02-03 VITALS — BP 118/70 | Ht 61.0 in | Wt 151.0 lb

## 2015-02-03 DIAGNOSIS — F331 Major depressive disorder, recurrent, moderate: Secondary | ICD-10-CM

## 2015-02-03 MED ORDER — CLONAZEPAM 1 MG PO TABS: 1.0000 mg | ORAL_TABLET | Freq: Three times a day (TID) | ORAL | Status: DC | PRN

## 2015-02-03 MED ORDER — ZOLPIDEM TARTRATE ER 12.5 MG PO TBCR
12.5000 mg | EXTENDED_RELEASE_TABLET | Freq: Every evening | ORAL | Status: DC | PRN
Start: 2015-02-03 — End: 2016-02-23

## 2015-02-03 MED ORDER — ESCITALOPRAM OXALATE 20 MG PO TABS: 20.0000 mg | ORAL_TABLET | Freq: Every day | ORAL | Status: DC
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
## 2015-02-06 ENCOUNTER — Ambulatory Visit: Payer: Self-pay | Admitting: Physical Therapy

## 2015-02-07 ENCOUNTER — Ambulatory Visit: Payer: Self-pay

## 2015-02-14 ENCOUNTER — Ambulatory Visit: Payer: Self-pay

## 2015-02-16 ENCOUNTER — Ambulatory Visit: Payer: Self-pay | Admitting: Physical Therapy

## 2015-02-17 ENCOUNTER — Ambulatory Visit: Payer: Self-pay | Admitting: Family Medicine

## 2015-02-19 DIAGNOSIS — S82892A Other fracture of left lower leg, initial encounter for closed fracture: Secondary | ICD-10-CM

## 2015-02-19 HISTORY — DX: Other fracture of left lower leg, initial encounter for closed fracture: S82.892A

## 2015-02-21 ENCOUNTER — Ambulatory Visit (INDEPENDENT_AMBULATORY_CARE_PROVIDER_SITE_OTHER): Payer: Medicare Other | Admitting: Family Medicine

## 2015-02-21 ENCOUNTER — Ambulatory Visit: Payer: Self-pay

## 2015-02-21 ENCOUNTER — Encounter: Payer: Self-pay | Admitting: Family Medicine

## 2015-02-21 VITALS — BP 114/64 | HR 68 | Temp 98.4°F | Wt 152.5 lb

## 2015-02-21 DIAGNOSIS — M7061 Trochanteric bursitis, right hip: Secondary | ICD-10-CM

## 2015-02-21 DIAGNOSIS — M5431 Sciatica, right side: Secondary | ICD-10-CM

## 2015-02-21 DIAGNOSIS — R2689 Other abnormalities of gait and mobility: Secondary | ICD-10-CM

## 2015-02-21 DIAGNOSIS — R740 Nonspecific elevation of levels of transaminase and lactic acid dehydrogenase [LDH]: Secondary | ICD-10-CM

## 2015-02-21 DIAGNOSIS — R7401 Elevation of levels of liver transaminase levels: Secondary | ICD-10-CM

## 2015-02-21 DIAGNOSIS — I10 Essential (primary) hypertension: Secondary | ICD-10-CM

## 2015-02-21 MED ORDER — PREDNISONE 20 MG PO TABS: ORAL_TABLET | ORAL | Status: DC

## 2015-02-21 NOTE — Assessment & Plan Note (Addendum)
Completed outpatient PT and has not had any falls at home since. Continues HEP provided by PT.

## 2015-02-21 NOTE — Progress Notes (Signed)
Pre visit review using our clinic review tool, if applicable. No additional management support is needed unless otherwise documented below in the visit note. 

## 2015-02-21 NOTE — Assessment & Plan Note (Addendum)
Longstanding issue. Pt states has had 3 unsuccessful bursal injections. Will treat with 1 wk prednisone taper and provided with exercises from [facility] pt advisor on trochanteric bursitis. ?bursectomy. Tramadol ineffective, avoid NSAIDs 2/2 CKD, avoid tylenol in recent mild transaminitis.

## 2015-02-21 NOTE — Progress Notes (Signed)
BP 114/64 mmHg  Pulse 68  Temp(Src) 98.4 F (36.9 C) (Oral)  Wt 152 lb 8 oz (69.174 kg)   CC: 4 mo f/u visit  Subjective:    Patient ID: Alexandria Leach, female    DOB: 1950-04-12, 64 y.o.   MRN: [id number]  HPI: Alexandria Leach is a 64 y.o. female presenting on 02/21/2015 for Follow-up   No falls since last seen here. Completed physical therapy.   R sciatica bothering her. On tramadol and zanaflex by rheumatology. Notes zanaflex causes worsening imbalance. voltaren gel hasn't really helped either.   HTN and diastolic CHF - compliant with meds (amlodipine 10mg , toprol xl 25mg  am and 50mg  nightly, lasix 20mg  daily prn). No recent chest pain, headaches, leg swelling, cough or dyspnea.   Relevant past medical, surgical, family and social history reviewed and updated as indicated. Interim medical history since our last visit reviewed. Allergies and medications reviewed and updated. Current Outpatient Prescriptions on File Prior to Visit  Medication Sig  . amLODipine (NORVASC) 10 MG tablet Take 1 tablet by mouth  daily  . antiseptic oral rinse (BIOTENE) LIQD 15 mLs by Mouth Rinse route as needed (Oral hygiene).   Marland Kitchen aspirin 325 MG EC tablet Take 325 mg by mouth every morning.   . cholecalciferol (VITAMIN D) 1000 UNITS tablet Take 1,000 Units by mouth every morning.   . clonazePAM (KLONOPIN) 1 MG tablet Take 1 tablet (1 mg total) by mouth 3 (three) times daily as needed for anxiety.  . cycloSPORINE (RESTASIS) 0.05 % ophthalmic emulsion Place 1 drop into both eyes 2 (two) times daily.  Marland Kitchen escitalopram (LEXAPRO) 20 MG tablet Take 1 tablet (20 mg total) by mouth daily.  . famotidine (PEPCID) 20 MG tablet Take 1 tablet (20 mg total) by mouth 2 (two) times daily.  . furosemide (LASIX) 20 MG tablet TAKE 1 TABLET BY MOUTH  DAILY AS NEEDED FOR EDEMA.  Marland Kitchen gabapentin (NEURONTIN) 300 MG capsule Take 1 tablet three times a day  . hydroxychloroquine (PLAQUENIL) 200 MG tablet Take 1 tablet (200 mg  total) by mouth 2 (two) times daily.  . metoprolol succinate (TOPROL-XL) 50 MG 24 hr tablet Take 1/2 tablet in the morning and 1 tablet at night time  . pilocarpine (SALAGEN) 5 MG tablet Take 0.5-1 tablets (2.5-5 mg total) by mouth 3 (three) times daily.  Vladimir Faster Glycol-Propyl Glycol (SYSTANE) 0.4-0.3 % GEL Apply 1 drop to eye 2 (two) times daily.   Marland Kitchen tiZANidine (ZANAFLEX) 4 MG tablet Take 1 tablet by mouth  every 8 hours as needed  muscle spasms  . traMADol (ULTRAM) 50 MG tablet Take 1 tablet (50 mg total) by mouth every 12 (twelve) hours as needed for severe pain.  Marland Kitchen VOLTAREN 1 % GEL Apply 2 g topically as needed (FOR PAIN).   Marland Kitchen ZETIA 10 MG tablet Take 10 mg by mouth daily.   Marland Kitchen zolpidem (AMBIEN CR) 12.5 MG CR tablet Take 1 tablet (12.5 mg total) by mouth at bedtime as needed for sleep.   No current facility-administered medications on file prior to visit.    Review of Systems Per HPI unless specifically indicated in ROS section     Objective:    BP 114/64 mmHg  Pulse 68  Temp(Src) 98.4 F (36.9 C) (Oral)  Wt 152 lb 8 oz (69.174 kg)  Wt Readings from Last 3 Encounters:  02/21/15 152 lb 8 oz (69.174 kg)  02/03/15 151 lb (68.493 kg)  01/12/15 155 lb 4  oz (70.421 kg)   Body mass index is 28.83 kg/(m^2).  Physical Exam  Constitutional: She appears well-developed and well-nourished. No distress.  HENT:  Mouth/Throat: Oropharynx is clear and moist. No oropharyngeal exudate.  Cardiovascular: Normal rate, regular rhythm, normal heart sounds and intact distal pulses.   No murmur heard. Pulmonary/Chest: Effort normal and breath sounds normal. No respiratory distress. She has no wheezes. She has no rales.  Musculoskeletal: She exhibits no edema.  No pain midline spine No paraspinous mm tenderness Neg SLR bilaterally. + pain with int/ext rotation at hip that lateralizes to lateral hip. Neg FABER. + pain at [place] on right and sciatic notch on right  Skin: Skin is warm and dry. No  rash noted.  Psychiatric: She has a normal mood and affect.  Nursing note and vitals reviewed.     Assessment & Plan:   Problem List Items Addressed This Visit    Trochanteric bursitis of right hip - Primary    Longstanding issue. Pt states has had 3 unsuccessful bursal injections. Will treat with 1 wk prednisone taper and provided with exercises from [facility] pt advisor on trochanteric bursitis. ?bursectomy. Tramadol ineffective, avoid NSAIDs 2/2 CKD, avoid tylenol in recent mild transaminitis.      Transaminitis    Will need to recheck next lab visit.      Right sided sciatica    Treat with prednisone course. Update with effect.      Imbalance    Completed outpatient PT and has not had any falls at home since. Continues HEP provided by PT.       Essential hypertension    Chronic, stable. Continue current regimen.          Follow up plan: Return in about 6 months (around 08/22/2015), or as needed, for medicare wellness visit.

## 2015-02-21 NOTE — Assessment & Plan Note (Signed)
Treat with prednisone course. Update with effect.

## 2015-02-21 NOTE — Patient Instructions (Addendum)
Return in 4-6 months for medicare wellness visit Sounds like you have sciatica and bursitis. Treat with prednisone course. Try exercises provided today. Update [place] or return to Dr Estanislado Pandy if no improvement with this. Nice to see you today, call us with questions.

## 2015-02-21 NOTE — Assessment & Plan Note (Signed)
Chronic, stable. Continue current regimen. 

## 2015-02-21 NOTE — Assessment & Plan Note (Signed)
Will need to recheck next lab visit.

## 2015-02-22 ENCOUNTER — Encounter: Payer: Self-pay | Admitting: Diagnostic Neuroimaging

## 2015-02-22 ENCOUNTER — Ambulatory Visit (INDEPENDENT_AMBULATORY_CARE_PROVIDER_SITE_OTHER): Payer: Medicare Other | Admitting: Diagnostic Neuroimaging

## 2015-02-22 VITALS — BP 112/72 | HR 73 | Ht 61.0 in | Wt 154.0 lb

## 2015-02-22 DIAGNOSIS — R200 Anesthesia of skin: Secondary | ICD-10-CM | POA: Diagnosis not present

## 2015-02-22 DIAGNOSIS — M3500 Sicca syndrome, unspecified: Secondary | ICD-10-CM | POA: Diagnosis not present

## 2015-02-22 DIAGNOSIS — R269 Unspecified abnormalities of gait and mobility: Secondary | ICD-10-CM | POA: Diagnosis not present

## 2015-02-22 NOTE — Patient Instructions (Signed)
Thank you for coming to see [place] at [facility]. I hope we have been able to provide you high quality care today.  You may receive a patient satisfaction survey over the next few weeks. We would appreciate your feedback and comments so that we may continue to improve ourselves and the health of our patients.  - Keep up the good work! Keep up with physical therapy exercises at home.   ~~~~~~~~~~~~~~~~~~~~~~~~~~~~~~~~~~~~~~~~~~~~~~~~~~~~~~~~~~~~~~~~~  DR. Idamae Coccia'S GUIDE TO HAPPY AND HEALTHY LIVING These are some of my general health and wellness recommendations. Some of them may apply to you better than others. Please use common sense as you try these suggestions and feel free to ask me any questions.   ACTIVITY/FITNESS Mental, social, emotional and physical stimulation are very important for brain and body health. Try learning a new activity (arts, music, language, sports, games).  Keep moving your body to the best of your abilities. You can do this at home, inside or outside, the park, community center, gym or anywhere you like. Consider a physical therapist or personal trainer to get started. Consider the app Sworkit. Fitness trackers such as smart-watches, smart-phones or Fitbits can help as well.   NUTRITION Eat more plants: colorful vegetables, nuts, seeds and berries.  Eat less sugar, salt, preservatives and processed foods.  Avoid toxins such as cigarettes and alcohol.  Drink water when you are thirsty. Warm water with a slice of lemon is an excellent morning drink to start the day.  Consider these websites for more information The Nutrition Source (https://www.henry-hernandez.biz/) Precision Nutrition (WindowBlog.ch)   RELAXATION Consider practicing mindfulness meditation or other relaxation techniques such as deep breathing, prayer, yoga, tai chi, massage. See website mindful.org or the apps Headspace or Calm to  help get started.   SLEEP Try to get at least 7-8+ hours sleep per day. Regular exercise and reduced caffeine will help you sleep better. Practice good sleep hygeine techniques. See website sleep.org for more information.   PLANNING Prepare estate planning, living will, healthcare POA documents. Sometimes this is best planned with the help of an attorney. Theconversationproject.org and agingwithdignity.org are excellent resources.

## 2015-02-22 NOTE — Progress Notes (Signed)
[facility]  PATIENT: Alexandria Leach DOB: 1950-10-26  REFERRING CLINICIAN: Abel Presto Deveshwar HISTORY FROM: patient  REASON FOR VISIT: follow up    HISTORICAL  CHIEF COMPLAINT:  Chief Complaint  Patient presents with  . Numbness    rm 6, numbness R side, "PT very helpful"  . Follow-up    3 month    HISTORY OF PRESENT ILLNESS:   UPDATE 02/22/15: Since last visit, has gone through PT and feels much better with balance. Numbness on right side is still present.   PRIOR HPI (11/18/14): 64 year old left-handed female with rheumatoid arthritis, fibromyalgia, Sjogren's syndrome, rheumatic heart disease, hypertension, depression, anxiety, here for evaluation of balance and gait difficulty. For past 1 year patient has had increasing balance and walking problems. She has fallen down 6 times. She also feels right-sided weakness and numbness. When patient stands up she feels okay, but when she starts to walk she feels like her legs do not do what she tells him to do. Patient has significant chronic pain in her neck, low back, arms and legs. She also has intermittent dizziness.   REVIEW OF SYSTEMS: Full 14 system review of systems performed and notable only for joint pain back pain aching muscles neck pain.   ALLERGIES: Allergies  Allergen Reactions  . Cymbalta [Duloxetine Hcl] Other (See Comments)    tachycardia  . Statins Nausea Only and Other (See Comments)    Muscle cramps also  . Sulfa Antibiotics Nausea And Vomiting    HOME MEDICATIONS: Outpatient Prescriptions Prior to Visit  Medication Sig Dispense Refill  . amLODipine (NORVASC) 10 MG tablet Take 1 tablet by mouth  daily 90 tablet 3  . antiseptic oral rinse (BIOTENE) LIQD 15 mLs by Mouth Rinse route as needed (Oral hygiene).     Marland Kitchen aspirin 325 MG EC tablet Take 325 mg by mouth every morning.     . cholecalciferol (VITAMIN D) 1000 UNITS tablet Take 1,000 Units by mouth every morning.     . clonazePAM  (KLONOPIN) 1 MG tablet Take 1 tablet (1 mg total) by mouth 3 (three) times daily as needed for anxiety. 270 tablet 2  . cycloSPORINE (RESTASIS) 0.05 % ophthalmic emulsion Place 1 drop into both eyes 2 (two) times daily. 3 each 2  . escitalopram (LEXAPRO) 20 MG tablet Take 1 tablet (20 mg total) by mouth daily. 90 tablet 2  . famotidine (PEPCID) 20 MG tablet Take 1 tablet (20 mg total) by mouth 2 (two) times daily. 180 tablet 1  . furosemide (LASIX) 20 MG tablet TAKE 1 TABLET BY MOUTH  DAILY AS NEEDED FOR EDEMA. 90 tablet 1  . gabapentin (NEURONTIN) 300 MG capsule Take 1 tablet three times a day 270 capsule 2  . hydroxychloroquine (PLAQUENIL) 200 MG tablet Take 1 tablet (200 mg total) by mouth 2 (two) times daily.    . metoprolol succinate (TOPROL-XL) 50 MG 24 hr tablet Take 1/2 tablet in the morning and 1 tablet at night time 135 tablet 2  . pilocarpine (SALAGEN) 5 MG tablet Take 0.5-1 tablets (2.5-5 mg total) by mouth 3 (three) times daily.    Vladimir Faster Glycol-Propyl Glycol (SYSTANE) 0.4-0.3 % GEL Apply 1 drop to eye 2 (two) times daily.     . predniSONE (DELTASONE) 20 MG tablet Take two tablets daily for 3 days followed by one tablet daily for 4 days 14 tablet 0  . tiZANidine (ZANAFLEX) 4 MG tablet Take 1 tablet by mouth  every 8 hours  as needed  muscle spasms 30 tablet 3  . traMADol (ULTRAM) 50 MG tablet Take 1 tablet (50 mg total) by mouth every 12 (twelve) hours as needed for severe pain. 30 tablet 3  . VOLTAREN 1 % GEL Apply 2 g topically as needed (FOR PAIN).   3  . ZETIA 10 MG tablet Take 10 mg by mouth daily.   13  . zolpidem (AMBIEN CR) 12.5 MG CR tablet Take 1 tablet (12.5 mg total) by mouth at bedtime as needed for sleep. 90 tablet 2   No facility-administered medications prior to visit.    PAST MEDICAL HISTORY: Past Medical History  Diagnosis Date  . Depression   . HTN (hypertension)   . Rheumatic heart disease 1980    s/p mitral valve repair 1980  . HLD (hyperlipidemia)     . Insomnia   . Anxiety   . History of DVT (deep vein thrombosis) several times latest 2012    receives coumadin while hospitalized  . History of pulmonary embolism 2001, 2006    completed coumadin courses  . Fibromyalgia   . Osteoarthritis     shoulders and knees, not RA per Dr Estanislado Pandy, positive ANA, positive Ro  . Personal history of urinary calculi latest 2014  . PONV (postoperative nausea and vomiting)   . Refusal of blood transfusions as patient is Jehovah's Witness   . Sjogren's syndrome ([place])   . CHF (congestive heart failure) ([place])   . Glaucoma     s/p surgery, sees ophtho Q6 mo  . CKD (chronic kidney disease) stage 3, GFR 30-59 ml/min     saw nephrologist Dr Harden Mo  . History of rheumatic fever x3  . DDD (degenerative disc disease), cervical   . Lung nodule 09/22/2012    RLL - 85mm, stable since 2014. Thought benign.     PAST SURGICAL HISTORY: Past Surgical History  Procedure Laterality Date  . Mitral valve repair  1980    open heart  . Tubal ligation  1980  . Vaginal hysterectomy  1992    for fibroids -- partial, ovaries remain  . Cholecystectomy  11/27/2011    Procedure: LAPAROSCOPIC CHOLECYSTECTOMY WITH INTRAOPERATIVE CHOLANGIOGRAM;  Surgeon: Adin Hector, MD;  Location: [facility];  Service: General;  Laterality: N/A;  laparoscopic cholecystectomy with choleangiogram umbilical hernia repair  . Umbilical hernia repair  11/27/2011    Procedure: HERNIA REPAIR UMBILICAL ADULT;  Surgeon: Adin Hector, MD;  Location: [facility];  Service: General;  Laterality: N/A;  . Breast biopsy Right 2006    benign  . Colonoscopy  07/2014    WNL Amedeo Plenty)  . Dexa  08/2012    normal per patient - no records available    FAMILY HISTORY: Family History  Problem Relation Age of Onset  . Lupus Sister     and niece  . Cancer Mother     lung (nonsmoker)  . CAD Mother     MI in her 2s  . ALS Mother   . Kidney disease Father   . Alcohol abuse Father   . Diabetes Father   . Cancer  Brother     bone  . Cancer Maternal Uncle     bone  . Depression Sister   . Kidney failure Other     on HD  . Diabetes Brother   . Diabetes Sister   . Stroke Maternal Grandmother   . Stroke Sister     SOCIAL HISTORY:  Social History   Social History  .  Marital Status: Married    Spouse Name: [name]  . Number of Children: 4  . Years of Education: 12   Occupational History  .      Disability   Social History Main Topics  . Smoking status: Never Smoker   . Smokeless tobacco: Never Used  . Alcohol Use: No  . Drug Use: No  . Sexual Activity: Not Currently    Birth Control/ Protection: Surgical   Other Topics Concern  . Not on file   Social History Narrative   Lives with son, 1 dog   Occupation: unemployed, on disability for fibromyalgia since 2008.   Edu: HS   Religion: Jehova's witness   Activity: volunteers at senior center   Diet: some water, fruits/vegetables daily   No caffeine use     PHYSICAL EXAM  GENERAL EXAM/CONSTITUTIONAL: Vitals:  Filed Vitals:   02/22/15 1012  BP: 112/72  Pulse: 73  Height: 5\' 1"  (1.549 m)  Weight: 154 lb (69.854 kg)   Body mass index is 29.11 kg/(m^2). No exam data present  Patient is in no distress; well developed, nourished and groomed; neck is supple   SOFT SPOKEN, HOARSE VOICE  CARDIOVASCULAR:  Examination of carotid arteries is normal; no carotid bruits  Regular rate and rhythm, WITH FAINT SYSTOLIC MURMUR  Examination of peripheral vascular system by observation and palpation is normal  EYES:  Ophthalmoscopic exam of optic discs and posterior segments is normal; no papilledema or hemorrhages  MUSCULOSKELETAL:  Gait, strength, tone, movements noted in Neurologic exam below  NEUROLOGIC: MENTAL STATUS:  No flowsheet data found.  awake, alert, oriented to person, place and time  recent and remote memory intact  normal attention and concentration  language fluent, comprehension intact, naming  intact,   fund of knowledge appropriate  CRANIAL NERVE:   2nd - no papilledema on fundoscopic exam  2nd, 3rd, 4th, 6th - pupils equal and reactive to light, visual fields full to confrontation, extraocular muscles intact, no nystagmus  5th - facial sensation symmetric  7th - facial strength symmetric  8th - hearing intact  9th - palate elevates symmetrically, uvula midline  11th - shoulder shrug symmetric  12th - tongue protrusion midline  MOTOR:   normal bulk and tone, full strength in the BUE; RLE HF SLIGHTLY LIMITED BY PAIN; LLE 5  SENSORY:   normal and symmetric to light touch  COORDINATION:   finger-nose-finger, fine finger movements normal  REFLEXES:   deep tendon reflexes 1+ and symmetric; EXCEPT ABSENT AT ANKLES  GAIT/STATION:   narrow based gait; SMOOTH, STABLE     DIAGNOSTIC DATA (LABS, IMAGING, TESTING) - I reviewed patient records, labs, notes, testing and imaging myself where available.  Lab Results  Component Value Date   WBC 6.5 01/09/2015   HGB 12.5 01/09/2015   HCT 38.7 01/09/2015   MCV 83.9 01/09/2015   PLT 193 01/09/2015      Component Value Date/Time   NA 140 01/09/2015 1240   NA 140 12/14/2014   K 3.9 01/09/2015 1240   K 4.5 12/14/2014   CL 106 01/09/2015 1240   CO2 27 01/09/2015 1240   GLUCOSE 89 01/09/2015 1240   BUN 9 01/09/2015 1240   CREATININE 1.34* 01/09/2015 1240   CREATININE 1.15 12/14/2014   CALCIUM 9.6 01/09/2015 1240   PROT 7.5 11/18/2014 1146   PROT 7.8 10/19/2014 0945   ALBUMIN 4.3 10/19/2014 0945   AST 46 12/14/2014   AST 35 10/19/2014 0945   ALT 38 12/14/2014  ALT 31 10/19/2014 0945   ALKPHOS 75 12/14/2014   ALKPHOS 87 10/19/2014 0945   BILITOT 0.4 12/14/2014   BILITOT 0.4 10/19/2014 0945   GFRNONAA 41* 01/09/2015 1240   GFRAA 48* 01/09/2015 1240   Lab Results  Component Value Date   CHOL 242* 10/19/2014   HDL 80.10 10/19/2014   LDLCALC 147* 10/19/2014   TRIG 72.0 10/19/2014   CHOLHDL 3  10/19/2014   No results found for: HGBA1C Lab Results  Component Value Date   VITAMINB12 898 11/18/2014   Lab Results  Component Value Date   TSH 1.860 11/18/2014    10/10/14 KNEE XRAY [I reviewed images myself and agree with interpretation. -VRP]  - Mild soft tissue swelling over the right patella. No fracture, subluxation or dislocation. No joint effusion. Mild degenerative changes with spurring in all 3 compartments and slight joint space narrowing in the medial compartment.  - No acute bony abnormality.  12/09/14 MRI brain (with and without) [I reviewed images myself and agree with interpretation. -VRP] g 1. Mild scattered periventricular, juxtacortical, subcortical and pontine chronic small vessel ischemic disease. 2. No abnormal enhancing lesions. 3. No acute findings.   12/09/14 MRI cervical spine (without) [I reviewed images myself and agree with interpretation. -VRP]  1. At C5-6: uncovertebral joint hypertrophy with moderate right foraminal stenosis. 2. At C6-7: disc bulging with mild left foraminal stenosis. 3. No intrinsic or compressive spinal cord lesions. 4. Incidental left thyroid cyst (1.3 cm).     ASSESSMENT AND PLAN  64 y.o. year old female here with one year history of gait and balance difficulty. She has fallen 6 times. History and exam notable for sensory and motor deficits on the right side. Additional testing ruled out secondary causes. Patient also has significant chronic pain, chronic rheumatologic disease which may be contributory to her gait and balance difficulty.   Ddx: neuropathy (sjogren's neuropathy vs idiopathic), cervical radiculopathy, lumbar radiculopathy, chronic pain, arthritis  Gait difficulty  Sjogren's disease ([place])  Numbness on right side    PLAN: - continue home PT exercises  Return if symptoms worsen or fail to improve, for return to PCP.    Penni Bombard, MD [id number], [id number] AM Certified in Neurology, Neurophysiology  and Neuroimaging  [facility] [address] [place], [place] [address] [phone]

## 2015-02-23 ENCOUNTER — Encounter (HOSPITAL_COMMUNITY): Payer: Self-pay | Admitting: Emergency Medicine

## 2015-02-23 ENCOUNTER — Emergency Department (HOSPITAL_COMMUNITY): Payer: Medicare Other

## 2015-02-23 ENCOUNTER — Ambulatory Visit: Payer: Self-pay

## 2015-02-23 ENCOUNTER — Observation Stay (HOSPITAL_COMMUNITY)
Admission: EM | Admit: 2015-02-23 | Discharge: 2015-02-27 | Disposition: A | Discharge status: Home / Self Care | Payer: Medicare Other | Attending: Internal Medicine | Admitting: Internal Medicine

## 2015-02-23 DIAGNOSIS — Z86718 Personal history of other venous thrombosis and embolism: Secondary | ICD-10-CM | POA: Diagnosis not present

## 2015-02-23 DIAGNOSIS — S82852A Displaced trimalleolar fracture of left lower leg, initial encounter for closed fracture: Principal | ICD-10-CM | POA: Diagnosis present

## 2015-02-23 DIAGNOSIS — N179 Acute kidney failure, unspecified: Secondary | ICD-10-CM | POA: Diagnosis not present

## 2015-02-23 DIAGNOSIS — Z79899 Other long term (current) drug therapy: Secondary | ICD-10-CM | POA: Diagnosis not present

## 2015-02-23 DIAGNOSIS — F329 Major depressive disorder, single episode, unspecified: Secondary | ICD-10-CM | POA: Insufficient documentation

## 2015-02-23 DIAGNOSIS — Z531 Procedure and treatment not carried out because of patient's decision for reasons of belief and group pressure: Secondary | ICD-10-CM

## 2015-02-23 DIAGNOSIS — S9305XA Dislocation of left ankle joint, initial encounter: Secondary | ICD-10-CM | POA: Diagnosis not present

## 2015-02-23 DIAGNOSIS — M797 Fibromyalgia: Secondary | ICD-10-CM | POA: Insufficient documentation

## 2015-02-23 DIAGNOSIS — N183 Chronic kidney disease, stage 3 (moderate): Secondary | ICD-10-CM | POA: Insufficient documentation

## 2015-02-23 DIAGNOSIS — S82892A Other fracture of left lower leg, initial encounter for closed fracture: Secondary | ICD-10-CM

## 2015-02-23 DIAGNOSIS — M159 Polyosteoarthritis, unspecified: Secondary | ICD-10-CM | POA: Diagnosis not present

## 2015-02-23 DIAGNOSIS — S8992XA Unspecified injury of left lower leg, initial encounter: Secondary | ICD-10-CM | POA: Diagnosis not present

## 2015-02-23 DIAGNOSIS — M25562 Pain in left knee: Secondary | ICD-10-CM | POA: Diagnosis not present

## 2015-02-23 DIAGNOSIS — Z86711 Personal history of pulmonary embolism: Secondary | ICD-10-CM | POA: Diagnosis not present

## 2015-02-23 DIAGNOSIS — I5189 Other ill-defined heart diseases: Secondary | ICD-10-CM

## 2015-02-23 DIAGNOSIS — D62 Acute posthemorrhagic anemia: Secondary | ICD-10-CM | POA: Diagnosis not present

## 2015-02-23 DIAGNOSIS — E7800 Pure hypercholesterolemia, unspecified: Secondary | ICD-10-CM | POA: Diagnosis present

## 2015-02-23 DIAGNOSIS — M3500 Sicca syndrome, unspecified: Secondary | ICD-10-CM | POA: Diagnosis present

## 2015-02-23 DIAGNOSIS — I10 Essential (primary) hypertension: Secondary | ICD-10-CM | POA: Diagnosis present

## 2015-02-23 DIAGNOSIS — M25572 Pain in left ankle and joints of left foot: Secondary | ICD-10-CM | POA: Diagnosis present

## 2015-02-23 DIAGNOSIS — T148 Other injury of unspecified body region: Secondary | ICD-10-CM | POA: Diagnosis not present

## 2015-02-23 DIAGNOSIS — S82432A Displaced oblique fracture of shaft of left fibula, initial encounter for closed fracture: Secondary | ICD-10-CM | POA: Diagnosis not present

## 2015-02-23 DIAGNOSIS — Z7982 Long term (current) use of aspirin: Secondary | ICD-10-CM | POA: Insufficient documentation

## 2015-02-23 DIAGNOSIS — Z789 Other specified health status: Secondary | ICD-10-CM

## 2015-02-23 DIAGNOSIS — T07 Unspecified multiple injuries: Secondary | ICD-10-CM | POA: Diagnosis not present

## 2015-02-23 DIAGNOSIS — Z9181 History of falling: Secondary | ICD-10-CM | POA: Insufficient documentation

## 2015-02-23 DIAGNOSIS — W19XXXA Unspecified fall, initial encounter: Secondary | ICD-10-CM

## 2015-02-23 DIAGNOSIS — W010XXA Fall on same level from slipping, tripping and stumbling without subsequent striking against object, initial encounter: Secondary | ICD-10-CM | POA: Diagnosis not present

## 2015-02-23 DIAGNOSIS — I099 Rheumatic heart disease, unspecified: Secondary | ICD-10-CM | POA: Diagnosis not present

## 2015-02-23 DIAGNOSIS — I130 Hypertensive heart and chronic kidney disease with heart failure and stage 1 through stage 4 chronic kidney disease, or unspecified chronic kidney disease: Secondary | ICD-10-CM | POA: Diagnosis not present

## 2015-02-23 DIAGNOSIS — S82899A Other fracture of unspecified lower leg, initial encounter for closed fracture: Secondary | ICD-10-CM

## 2015-02-23 DIAGNOSIS — S8252XA Displaced fracture of medial malleolus of left tibia, initial encounter for closed fracture: Secondary | ICD-10-CM | POA: Diagnosis not present

## 2015-02-23 DIAGNOSIS — I5032 Chronic diastolic (congestive) heart failure: Secondary | ICD-10-CM | POA: Diagnosis not present

## 2015-02-23 DIAGNOSIS — T148XXA Other injury of unspecified body region, initial encounter: Secondary | ICD-10-CM

## 2015-02-23 HISTORY — DX: Other fracture of left lower leg, initial encounter for closed fracture: S82.892A

## 2015-02-23 HISTORY — DX: Displaced trimalleolar fracture of left lower leg, initial encounter for closed fracture: S82.852A

## 2015-02-23 LAB — CBC
HCT: 32.7 % — ABNORMAL LOW (ref 36.0–46.0)
HEMOGLOBIN: 10.7 g/dL — AB (ref 12.0–15.0)
MCH: 27 pg (ref 26.0–34.0)
MCHC: 32.7 g/dL (ref 30.0–36.0)
MCV: 82.6 fL (ref 78.0–100.0)
Platelets: 175 10*3/uL (ref 150–400)
RBC: 3.96 MIL/uL (ref 3.87–5.11)
RDW: 14 % (ref 11.5–15.5)
WBC: 9.5 10*3/uL (ref 4.0–10.5)

## 2015-02-23 LAB — I-STAT CHEM 8, ED
BUN: 11 mg/dL (ref 6–20)
CALCIUM ION: 1.26 mmol/L (ref 1.13–1.30)
CHLORIDE: 109 mmol/L (ref 101–111)
Creatinine, Ser: 0.9 mg/dL (ref 0.44–1.00)
Glucose, Bld: 136 mg/dL — ABNORMAL HIGH (ref 65–99)
HEMATOCRIT: 38 % (ref 36.0–46.0)
Hemoglobin: 12.9 g/dL (ref 12.0–15.0)
POTASSIUM: 4.2 mmol/L (ref 3.5–5.1)
SODIUM: 143 mmol/L (ref 135–145)
TCO2: 24 mmol/L (ref 0–100)

## 2015-02-23 MED ORDER — ESCITALOPRAM OXALATE 10 MG PO TABS
20.0000 mg | ORAL_TABLET | Freq: Every day | ORAL | Status: DC
  Administered 2015-02-24 – 2015-02-27 (×3): 20 mg via ORAL
  Filled 2015-02-23 (×4): qty 2

## 2015-02-23 MED ORDER — ESCITALOPRAM OXALATE 10 MG PO TABS: 20.0000 mg | ORAL_TABLET | Freq: Every day | ORAL | Status: DC

## 2015-02-23 MED ORDER — METOPROLOL SUCCINATE ER 25 MG PO TB24
25.0000 mg | ORAL_TABLET | Freq: Two times a day (BID) | ORAL | Status: DC
  Administered 2015-02-24 – 2015-02-27 (×6): 25 mg via ORAL
  Filled 2015-02-23 (×8): qty 1

## 2015-02-23 MED ORDER — ZOLPIDEM TARTRATE 5 MG PO TABS: 5.0000 mg | ORAL_TABLET | Freq: Every evening | ORAL | Status: DC | PRN

## 2015-02-23 MED ORDER — ONDANSETRON HCL 4 MG/2ML IJ SOLN
4.0000 mg | Freq: Four times a day (QID) | INTRAMUSCULAR | Status: DC | PRN
  Administered 2015-02-26: 4 mg via INTRAVENOUS

## 2015-02-23 MED ORDER — GABAPENTIN 300 MG PO CAPS
300.0000 mg | ORAL_CAPSULE | Freq: Three times a day (TID) | ORAL | Status: DC
  Administered 2015-02-23 – 2015-02-27 (×10): 300 mg via ORAL
  Filled 2015-02-23 (×11): qty 1

## 2015-02-23 MED ORDER — PILOCARPINE HCL 5 MG PO TABS
2.5000 mg | ORAL_TABLET | ORAL | Status: DC
  Administered 2015-02-24 – 2015-02-25 (×2): 2.5 mg via ORAL
  Filled 2015-02-23 (×4): qty 1

## 2015-02-23 MED ORDER — HYDROMORPHONE HCL 1 MG/ML IJ SOLN
1.0000 mg | Freq: Once | INTRAMUSCULAR | Status: AC
  Administered 2015-02-23: 1 mg via INTRAVENOUS
  Filled 2015-02-23: qty 1

## 2015-02-23 MED ORDER — HYDROMORPHONE HCL 1 MG/ML IJ SOLN
1.0000 mg | INTRAMUSCULAR | Status: DC | PRN
  Administered 2015-02-23 – 2015-02-24 (×2): 1 mg via INTRAVENOUS
  Filled 2015-02-23: qty 1

## 2015-02-23 MED ORDER — FAMOTIDINE 20 MG PO TABS
20.0000 mg | ORAL_TABLET | Freq: Two times a day (BID) | ORAL | Status: DC
  Administered 2015-02-23 – 2015-02-27 (×7): 20 mg via ORAL
  Filled 2015-02-23 (×8): qty 1

## 2015-02-23 MED ORDER — FENTANYL CITRATE (PF) 100 MCG/2ML IJ SOLN
50.0000 ug | Freq: Once | INTRAMUSCULAR | Status: AC
  Administered 2015-02-23: 50 ug via INTRAVENOUS
  Filled 2015-02-23: qty 2

## 2015-02-23 MED ORDER — PILOCARPINE HCL 5 MG PO TABS: 2.5000 mg | ORAL_TABLET | Freq: Three times a day (TID) | ORAL | Status: DC

## 2015-02-23 MED ORDER — ONDANSETRON HCL 4 MG PO TABS: 4.0000 mg | ORAL_TABLET | Freq: Four times a day (QID) | ORAL | Status: DC | PRN

## 2015-02-23 MED ORDER — DOCUSATE SODIUM 100 MG PO CAPS
100.0000 mg | ORAL_CAPSULE | Freq: Two times a day (BID) | ORAL | Status: DC
  Administered 2015-02-23 – 2015-02-27 (×7): 100 mg via ORAL
  Filled 2015-02-23 (×8): qty 1

## 2015-02-23 MED ORDER — CYCLOSPORINE 0.05 % OP EMUL
1.0000 [drp] | Freq: Two times a day (BID) | OPHTHALMIC | Status: DC
  Administered 2015-02-23 – 2015-02-27 (×5): 1 [drp] via OPHTHALMIC
  Filled 2015-02-23 (×9): qty 1

## 2015-02-23 MED ORDER — ENOXAPARIN SODIUM 40 MG/0.4ML ~~LOC~~ SOLN
40.0000 mg | SUBCUTANEOUS | Status: AC
  Administered 2015-02-23 – 2015-02-24 (×2): 40 mg via SUBCUTANEOUS
  Filled 2015-02-23 (×3): qty 0.4

## 2015-02-23 MED ORDER — CLONAZEPAM 1 MG PO TABS: 1.0000 mg | ORAL_TABLET | Freq: Three times a day (TID) | ORAL | Status: DC | PRN

## 2015-02-23 MED ORDER — HYDROMORPHONE HCL 1 MG/ML IJ SOLN
1.0000 mg | Freq: Once | INTRAMUSCULAR | Status: AC
Start: 2015-02-23 — End: 2015-02-23
  Administered 2015-02-23: 1 mg via INTRAVENOUS
  Filled 2015-02-23: qty 1

## 2015-02-23 MED ORDER — PILOCARPINE HCL 5 MG PO TABS
5.0000 mg | ORAL_TABLET | Freq: Every day | ORAL | Status: DC
  Administered 2015-02-24 – 2015-02-25 (×2): 5 mg via ORAL
  Filled 2015-02-23 (×5): qty 1

## 2015-02-23 MED ORDER — HYDROMORPHONE HCL 1 MG/ML IJ SOLN
1.0000 mg | Freq: Once | INTRAMUSCULAR | Status: DC
  Filled 2015-02-23: qty 1

## 2015-02-23 MED ORDER — SODIUM CHLORIDE 0.9 % IV SOLN
INTRAVENOUS | Status: DC
  Administered 2015-02-23: 19:00:00 via INTRAVENOUS

## 2015-02-23 MED ORDER — FENTANYL CITRATE (PF) 100 MCG/2ML IJ SOLN
100.0000 ug | Freq: Once | INTRAMUSCULAR | Status: AC
  Administered 2015-02-23: 100 ug via INTRAVENOUS
  Filled 2015-02-23: qty 2

## 2015-02-23 MED ORDER — HYDROXYCHLOROQUINE SULFATE 200 MG PO TABS
200.0000 mg | ORAL_TABLET | Freq: Two times a day (BID) | ORAL | Status: DC
  Administered 2015-02-23 – 2015-02-27 (×7): 200 mg via ORAL
  Filled 2015-02-23 (×9): qty 1

## 2015-02-23 MED ORDER — ASPIRIN EC 325 MG PO TBEC
325.0000 mg | DELAYED_RELEASE_TABLET | Freq: Every morning | ORAL | Status: DC
Start: 2015-02-24 — End: 2015-02-24
  Administered 2015-02-24: 325 mg via ORAL
  Filled 2015-02-23: qty 1

## 2015-02-23 MED ORDER — EZETIMIBE 10 MG PO TABS
10.0000 mg | ORAL_TABLET | Freq: Every day | ORAL | Status: DC
  Administered 2015-02-24 – 2015-02-27 (×3): 10 mg via ORAL
  Filled 2015-02-23 (×4): qty 1

## 2015-02-23 MED ORDER — HYDROCODONE-ACETAMINOPHEN 5-325 MG PO TABS
1.0000 | ORAL_TABLET | ORAL | Status: DC | PRN
  Administered 2015-02-23: 2 via ORAL
  Administered 2015-02-24: 1 via ORAL
  Administered 2015-02-24 (×3): 2 via ORAL
  Filled 2015-02-23 (×3): qty 2
  Filled 2015-02-23 (×2): qty 1
  Filled 2015-02-23: qty 2

## 2015-02-23 NOTE — ED Notes (Signed)
Pt requesting pain medication. Provider aware.

## 2015-02-23 NOTE — Consult Note (Signed)
ORTHOPAEDIC CONSULTATION  REQUESTING PHYSICIAN: Sherwood Gambler, MD  Chief Complaint: Left ankle injury  HPI: Alexandria Leach is a 64 y.o. female who presents with left ankle injury s/p mechanical fall earlier today.  Had immediate onset of severe pain, deformity and inability to ambulate.  She walks with walker at baseline.  Pain does not radiate, sharp grinding pain, constant pain, worse with movement of ankle.  ER was unsuccessful with 2 attempts at reduction.  Ortho consulted.    Past Medical History  Diagnosis Date  . Depression   . HTN (hypertension)   . Rheumatic heart disease 1980    s/p mitral valve repair 1980  . HLD (hyperlipidemia)   . Insomnia   . Anxiety   . History of DVT (deep vein thrombosis) several times latest 2012    receives coumadin while hospitalized  . History of pulmonary embolism 2001, 2006    completed coumadin courses  . Fibromyalgia   . Osteoarthritis     shoulders and knees, not RA per Dr Estanislado Pandy, positive ANA, positive Ro  . Personal history of urinary calculi latest 2014  . PONV (postoperative nausea and vomiting)   . Refusal of blood transfusions as patient is Jehovah's Witness   . Sjogren's syndrome ([place])   . CHF (congestive heart failure) ([place])   . Glaucoma     s/p surgery, sees ophtho Q6 mo  . CKD (chronic kidney disease) stage 3, GFR 30-59 ml/min     saw nephrologist Dr Harden Mo  . History of rheumatic fever x3  . DDD (degenerative disc disease), cervical   . Lung nodule 09/22/2012    RLL - 74mm, stable since 2014. Thought benign.    Past Surgical History  Procedure Laterality Date  . Mitral valve repair  1980    open heart  . Tubal ligation  1980  . Vaginal hysterectomy  1992    for fibroids -- partial, ovaries remain  . Cholecystectomy  11/27/2011    Procedure: LAPAROSCOPIC CHOLECYSTECTOMY WITH INTRAOPERATIVE CHOLANGIOGRAM;  Surgeon: Adin Hector, MD;  Location: [facility];  Service: General;  Laterality: N/A;  laparoscopic  cholecystectomy with choleangiogram umbilical hernia repair  . Umbilical hernia repair  11/27/2011    Procedure: HERNIA REPAIR UMBILICAL ADULT;  Surgeon: Adin Hector, MD;  Location: [facility];  Service: General;  Laterality: N/A;  . Breast biopsy Right 2006    benign  . Colonoscopy  07/2014    WNL Amedeo Plenty)  . Dexa  08/2012    normal per patient - no records available   Social History   Social History  . Marital Status: Married    Spouse Name: [name]  . Number of Children: 4  . Years of Education: 12   Occupational History  .      Disability   Social History Main Topics  . Smoking status: Never Smoker   . Smokeless tobacco: Never Used  . Alcohol Use: No  . Drug Use: No  . Sexual Activity: Not Currently    Birth Control/ Protection: Surgical   Other Topics Concern  . None   Social History Narrative   Lives with son, 1 dog   Occupation: unemployed, on disability for fibromyalgia since 2008.   Edu: HS   Religion: Jehova's witness   Activity: volunteers at senior center   Diet: some water, fruits/vegetables daily   No caffeine use   Family History  Problem Relation Age of Onset  . Lupus Sister     and niece  .  Cancer Mother     lung (nonsmoker)  . CAD Mother     MI in her 45s  . ALS Mother   . Kidney disease Father   . Alcohol abuse Father   . Diabetes Father   . Cancer Brother     bone  . Cancer Maternal Uncle     bone  . Depression Sister   . Kidney failure Other     on HD  . Diabetes Brother   . Diabetes Sister   . Stroke Maternal Grandmother   . Stroke Sister    - negative except otherwise stated in the family history section Allergies  Allergen Reactions  . Cymbalta [Duloxetine Hcl] Other (See Comments)    tachycardia  . Statins Nausea Only and Other (See Comments)    Muscle cramps also  . Sulfa Antibiotics Nausea And Vomiting   Prior to Admission medications   Medication Sig Start Date End Date Taking? Authorizing Provider  amLODipine  (NORVASC) 10 MG tablet Take 1 tablet by mouth  daily 01/25/15  Yes Ria Bush, MD  antiseptic oral rinse (BIOTENE) LIQD 15 mLs by Mouth Rinse route as needed (Oral hygiene).    Yes Historical Provider, MD  aspirin 325 MG EC tablet Take 325 mg by mouth every morning.    Yes Historical Provider, MD  cholecalciferol (VITAMIN D) 1000 UNITS tablet Take 1,000 Units by mouth every morning.    Yes Historical Provider, MD  clonazePAM (KLONOPIN) 1 MG tablet Take 1 tablet (1 mg total) by mouth 3 (three) times daily as needed for anxiety. 02/03/15  Yes Cloria Spring, MD  cycloSPORINE (RESTASIS) 0.05 % ophthalmic emulsion Place 1 drop into both eyes 2 (two) times daily. 07/26/14  Yes Ria Bush, MD  escitalopram (LEXAPRO) 20 MG tablet Take 1 tablet (20 mg total) by mouth daily. 02/03/15 02/03/16 Yes Cloria Spring, MD  famotidine (PEPCID) 20 MG tablet Take 1 tablet (20 mg total) by mouth 2 (two) times daily. 07/27/14  Yes Ria Bush, MD  furosemide (LASIX) 20 MG tablet TAKE 1 TABLET BY MOUTH  DAILY AS NEEDED FOR EDEMA. 01/25/15  Yes Ria Bush, MD  gabapentin (NEURONTIN) 300 MG capsule Take 1 tablet three times a day Patient taking differently: Take 300 mg by mouth 3 (three) times daily.  07/26/14  Yes Ria Bush, MD  hydroxychloroquine (PLAQUENIL) 200 MG tablet Take 1 tablet (200 mg total) by mouth 2 (two) times daily. 11/05/14  Yes Ria Bush, MD  metoprolol succinate (TOPROL-XL) 50 MG 24 hr tablet Take 1/2 tablet in the morning and 1 tablet at night time 07/26/14  Yes Ria Bush, MD  pilocarpine (SALAGEN) 5 MG tablet Take 0.5-1 tablets (2.5-5 mg total) by mouth 3 (three) times daily. 11/05/14  Yes Ria Bush, MD  Polyethyl Glycol-Propyl Glycol (SYSTANE) 0.4-0.3 % GEL Apply 1 drop to eye 2 (two) times daily.    Yes Historical Provider, MD  predniSONE (DELTASONE) 20 MG tablet Take two tablets daily for 3 days followed by one tablet daily for 4 days 02/21/15  Yes Ria Bush, MD  tiZANidine (ZANAFLEX) 4 MG tablet Take 1 tablet by mouth  every 8 hours as needed  muscle spasms 12/07/14  Yes Ria Bush, MD  traMADol (ULTRAM) 50 MG tablet Take 1 tablet (50 mg total) by mouth every 12 (twelve) hours as needed for severe pain. 09/08/14  Yes Ria Bush, MD  VOLTAREN 1 % GEL Apply 2 g topically as needed (FOR PAIN).  11/08/13  Yes Historical Provider, MD  ZETIA 10 MG tablet Take 10 mg by mouth daily.  01/12/14  Yes Historical Provider, MD  zolpidem (AMBIEN CR) 12.5 MG CR tablet Take 1 tablet (12.5 mg total) by mouth at bedtime as needed for sleep. 02/03/15 02/03/16 Yes Cloria Spring, MD   Dg Tibia/fibula Left  02/23/2015  CLINICAL DATA:  64 year old female with injury stepping out of cough are. Ankle pain extending up left leg. Initial encounter. EXAM: LEFT TIBIA AND FIBULA - 2 VIEW; LEFT ANKLE COMPLETE - 3+ VIEW COMPARISON:  None. FINDINGS: Left ankle three views: Trimalleolar comminuted fracture or dislocation. The talar dome is displaced laterally and posteriorly. Comminuted medial malleolar fracture fragment slightly displaced. Posterior distal tibial fracture fragments slightly displaced. Oblique fracture of the distal left fibula with overlapping of displaced fracture fragments. Left tibia -fibula: Besides the above described fracture, no proximal left tibia -fibular fracture noted. IMPRESSION: Left ankle three views: Trimalleolar comminuted fracture or dislocation. The talar dome is displaced laterally and posteriorly. Comminuted medial malleolar fracture fragment slightly displaced. Posterior distal tibial fracture fragments slightly displaced. Oblique fracture of the distal left fibula with overlapping of displaced fracture fragments. Left tibia -fibula: Besides the above described fracture, no proximal left tibia -fibular fracture noted. Electronically Signed   By: Genia Del M.D.   On: 02/23/2015 11:36   Dg Ankle Complete Left  02/23/2015  CLINICAL  DATA:  64 year old female with injury stepping out of cough are. Ankle pain extending up left leg. Initial encounter. EXAM: LEFT TIBIA AND FIBULA - 2 VIEW; LEFT ANKLE COMPLETE - 3+ VIEW COMPARISON:  None. FINDINGS: Left ankle three views: Trimalleolar comminuted fracture or dislocation. The talar dome is displaced laterally and posteriorly. Comminuted medial malleolar fracture fragment slightly displaced. Posterior distal tibial fracture fragments slightly displaced. Oblique fracture of the distal left fibula with overlapping of displaced fracture fragments. Left tibia -fibula: Besides the above described fracture, no proximal left tibia -fibular fracture noted. IMPRESSION: Left ankle three views: Trimalleolar comminuted fracture or dislocation. The talar dome is displaced laterally and posteriorly. Comminuted medial malleolar fracture fragment slightly displaced. Posterior distal tibial fracture fragments slightly displaced. Oblique fracture of the distal left fibula with overlapping of displaced fracture fragments. Left tibia -fibula: Besides the above described fracture, no proximal left tibia -fibular fracture noted. Electronically Signed   By: Genia Del M.D.   On: 02/23/2015 11:36   Dg Knee Complete 4 Views Left  02/23/2015  CLINICAL DATA:  Fall, left ankle deformity, left knee pain EXAM: LEFT KNEE - COMPLETE 4+ VIEW COMPARISON:  12/24/2012 FINDINGS: Four views of the left knee submitted. No acute fracture or subluxation. No radiopaque foreign body. There is no joint effusion. IMPRESSION: Negative. Electronically Signed   By: Lahoma Crocker M.D.   On: 02/23/2015 11:27   Dg Ankle Left Port  02/23/2015  CLINICAL DATA:  Post reduction fracture dislocation EXAM: PORTABLE LEFT ANKLE - 2 VIEW COMPARISON:  Radiograph 02/23/2015 FINDINGS: Interval splinting. There is interval improvement of the articulation at the tibiotalar joint however the talus remains subluxed laterally in relation to the tibia. On the  lateral projection the tibia is subluxed anterior in relation to the talus. Fractures of the lateral and medial malleolus again noted. IMPRESSION: 1. Improved alignment of the fracture dislocation of the LEFT ankle. 2. Significant subluxation remains as above. 3. Fine detail obscured by splint material. Electronically Signed   By: Suzy Bouchard M.D.   On: 02/23/2015 13:38   Dg Foot Complete  Left  02/23/2015  CLINICAL DATA:  Fall with ankle injury EXAM: LEFT FOOT - COMPLETE 3+ VIEW COMPARISON:  Left ankle today FINDINGS: Fracture dislocation of the ankle. No fracture of the foot. Mild degenerative change in the first MTP joint. Early calcaneal spurring. IMPRESSION: Fracture dislocation of the ankle.  No foot fracture. Electronically Signed   By: Franchot Gallo M.D.   On: 02/23/2015 11:35   - pertinent xrays, CT, MRI studies were reviewed and independently interpreted  Positive ROS: All other systems have been reviewed and were otherwise negative with the exception of those mentioned in the HPI and as above.  Physical Exam: General: Alert, no acute distress Cardiovascular: No pedal edema Respiratory: No cyanosis, no use of accessory musculature GI: No organomegaly, abdomen is soft and non-tender Skin: No lesions in the area of chief complaint Neurologic: Sensation intact distally Psychiatric: Patient is competent for consent with normal mood and affect Lymphatic: No axillary or cervical lymphadenopathy  MUSCULOSKELETAL:  - skin intact - strong distal pulses - foot wwp - sensation intact  Assessment: Left trimalleolar ankle fracture dislocation CHF, RA, sjogren's  Plan: - successful reduction in ER by ortho, splinted, post reduction xrays ok - will need to admit to hospitalist - patient would be unsafe to go home - will need surgery in approximately 3-5 days depending on swelling - patient does have h/o PE, currently just on aspirin, was on coumadin -   Thank you for the consult  and the opportunity to see Ms. Fluegel  N. Eduard Roux, MD [place] 2:58 PM

## 2015-02-23 NOTE — ED Notes (Addendum)
Dr Erlinda Hong at bedside. Portable xray called.

## 2015-02-23 NOTE — ED Notes (Addendum)
Xray called to confirm reduction.

## 2015-02-23 NOTE — ED Notes (Signed)
Portable at bedside; pt wants pain meds after xray.

## 2015-02-23 NOTE — ED Notes (Signed)
MD at bedside to confirm that there is no pulse.

## 2015-02-23 NOTE — ED Notes (Signed)
PT to xray; no issues with pain at moment; asked that she let RN know if needs more pain meds at any point. Pulses remain intact.

## 2015-02-23 NOTE — ED Notes (Signed)
Radiology called again.

## 2015-02-23 NOTE — Progress Notes (Addendum)
Pt arrived from ED at [facility] and was placed in 5N02. Pt is alert and oriented and VSS. Pt c/o of 7/10 pain and was then given PRN pain medication. LLE was elevated and ice applied. Will continue to monitor

## 2015-02-23 NOTE — ED Notes (Signed)
EDP and ortho tech at bedside for adjust splint. Portable xray called for another image.

## 2015-02-23 NOTE — ED Notes (Signed)
Stepped out of car and rolled left ankle; possible deformity; swelling; PNS and can feel sensation; no LOC; no neck or back pain; on blood thinners; L knee abrasion. EMS gave 50 fentanyl and 3/10. IV placed by EMS; normal sinus. No distress.

## 2015-02-23 NOTE — Progress Notes (Signed)
Orthopedic Tech Progress Note Patient Details:  Alexandria Leach Dec 30, 1950 [id number]  Ortho Devices Type of Ortho Device: Post (short leg) splint, Stirrup splint, Ace wrap Ortho Device/Splint Interventions: Application   [place] Pink 02/23/2015, 1:10 PM

## 2015-02-23 NOTE — H&P (Signed)
[facility] History and Physical  Alexandria Leach [id number] DOB: June 12, 1950 DOA: 02/23/2015  Referring physician: Emergency Department PCP: Ria Bush, MD   CHIEF COMPLAINT:  ankle fracture    HPI: Alexandria Leach is a 64 y.o. female with multiple medical problems. She fell after stepping onto her cane today. Now with left ankle fracture. Orthopedics reduced fracture.   ED COURSE:     Labs:   Normal renal function Hemoglobin 12.9 (at baseline)  Medications  fentaNYL (SUBLIMAZE) injection 50 mcg (50 mcg Intravenous Given 02/23/15 1149)  HYDROmorphone (DILAUDID) injection 1 mg (1 mg Intravenous Given 02/23/15 1224)  HYDROmorphone (DILAUDID) injection 1 mg (1 mg Intravenous Given 02/23/15 1317)  HYDROmorphone (DILAUDID) injection 1 mg (1 mg Intravenous Given 02/23/15 1433)  fentaNYL (SUBLIMAZE) injection 100 mcg (100 mcg Intravenous Given 02/23/15 1506)   Review of Systems  Constitutional: Negative.   HENT: Negative.   Eyes: Negative.   Respiratory: Negative.   Cardiovascular: Negative.   Gastrointestinal: Negative.   Genitourinary: Negative.   Musculoskeletal: Positive for falls.  Skin: Negative.   Neurological: Negative.   Endo/Heme/Allergies: Negative.   Psychiatric/Behavioral: Negative.     Past Medical History  Diagnosis Date  . Depression   . HTN (hypertension)   . Rheumatic heart disease 1980    s/p mitral valve repair 1980  . HLD (hyperlipidemia)   . Insomnia   . Anxiety   . History of DVT (deep vein thrombosis) several times latest 2012    receives coumadin while hospitalized  . History of pulmonary embolism 2001, 2006    completed coumadin courses  . Fibromyalgia   . Osteoarthritis     shoulders and knees, not RA per Dr Estanislado Pandy, positive ANA, positive Ro  . Personal history of urinary calculi latest 2014  . PONV (postoperative nausea and vomiting)   . Refusal of blood transfusions as patient is Jehovah's Witness   . Sjogren's syndrome  ([place])   . CHF (congestive heart failure) ([place])   . Glaucoma     s/p surgery, sees ophtho Q6 mo  . CKD (chronic kidney disease) stage 3, GFR 30-59 ml/min     saw nephrologist Dr Harden Mo  . History of rheumatic fever x3  . DDD (degenerative disc disease), cervical   . Lung nodule 09/22/2012    RLL - 91mm, stable since 2014. Thought benign.    Past Surgical History  Procedure Laterality Date  . Mitral valve repair  1980    open heart  . Tubal ligation  1980  . Vaginal hysterectomy  1992    for fibroids -- partial, ovaries remain  . Cholecystectomy  11/27/2011    Procedure: LAPAROSCOPIC CHOLECYSTECTOMY WITH INTRAOPERATIVE CHOLANGIOGRAM;  Surgeon: Adin Hector, MD;  Location: [facility];  Service: General;  Laterality: N/A;  laparoscopic cholecystectomy with choleangiogram umbilical hernia repair  . Umbilical hernia repair  11/27/2011    Procedure: HERNIA REPAIR UMBILICAL ADULT;  Surgeon: Adin Hector, MD;  Location: [facility];  Service: General;  Laterality: N/A;  . Breast biopsy Right 2006    benign  . Colonoscopy  07/2014    WNL Amedeo Plenty)  . Dexa  08/2012    normal per patient - no records available    SOCIAL HISTORY:  reports that she has never smoked. She has never used smokeless tobacco. She reports that she does not drink alcohol or use illicit drugs. Lives: at home with husband    Assistive devices:   Cane as needed for ambulation.  Allergies  Allergen Reactions  . Cymbalta [Duloxetine Hcl] Other (See Comments)    tachycardia  . Statins Nausea Only and Other (See Comments)    Muscle cramps also  . Sulfa Antibiotics Nausea And Vomiting    Family History  Problem Relation Age of Onset  . Lupus Sister     and niece  . Cancer Mother     lung (nonsmoker)  . CAD Mother     MI in her 52s  . ALS Mother   . Kidney disease Father   . Alcohol abuse Father   . Diabetes Father   . Cancer Brother     bone  . Cancer Maternal Uncle     bone  . Depression Sister   . Kidney failure  Other     on HD  . Diabetes Brother   . Diabetes Sister   . Stroke Maternal Grandmother   . Stroke Sister     Prior to Admission medications   Medication Sig Start Date End Date Taking? Authorizing Provider  amLODipine (NORVASC) 10 MG tablet Take 1 tablet by mouth  daily 01/25/15  Yes Ria Bush, MD  antiseptic oral rinse (BIOTENE) LIQD 15 mLs by Mouth Rinse route as needed (Oral hygiene).    Yes Historical Provider, MD  aspirin 325 MG EC tablet Take 325 mg by mouth every morning.    Yes Historical Provider, MD  cholecalciferol (VITAMIN D) 1000 UNITS tablet Take 1,000 Units by mouth every morning.    Yes Historical Provider, MD  clonazePAM (KLONOPIN) 1 MG tablet Take 1 tablet (1 mg total) by mouth 3 (three) times daily as needed for anxiety. 02/03/15  Yes Cloria Spring, MD  cycloSPORINE (RESTASIS) 0.05 % ophthalmic emulsion Place 1 drop into both eyes 2 (two) times daily. 07/26/14  Yes Ria Bush, MD  escitalopram (LEXAPRO) 20 MG tablet Take 1 tablet (20 mg total) by mouth daily. 02/03/15 02/03/16 Yes Cloria Spring, MD  famotidine (PEPCID) 20 MG tablet Take 1 tablet (20 mg total) by mouth 2 (two) times daily. 07/27/14  Yes Ria Bush, MD  furosemide (LASIX) 20 MG tablet TAKE 1 TABLET BY MOUTH  DAILY AS NEEDED FOR EDEMA. 01/25/15  Yes Ria Bush, MD  gabapentin (NEURONTIN) 300 MG capsule Take 1 tablet three times a day Patient taking differently: Take 300 mg by mouth 3 (three) times daily.  07/26/14  Yes Ria Bush, MD  hydroxychloroquine (PLAQUENIL) 200 MG tablet Take 1 tablet (200 mg total) by mouth 2 (two) times daily. 11/05/14  Yes Ria Bush, MD  metoprolol succinate (TOPROL-XL) 50 MG 24 hr tablet Take 1/2 tablet in the morning and 1 tablet at night time 07/26/14  Yes Ria Bush, MD  pilocarpine (SALAGEN) 5 MG tablet Take 0.5-1 tablets (2.5-5 mg total) by mouth 3 (three) times daily. 11/05/14  Yes Ria Bush, MD  Polyethyl Glycol-Propyl Glycol  (SYSTANE) 0.4-0.3 % GEL Apply 1 drop to eye 2 (two) times daily.    Yes Historical Provider, MD  predniSONE (DELTASONE) 20 MG tablet Take two tablets daily for 3 days followed by one tablet daily for 4 days 02/21/15  Yes Ria Bush, MD  tiZANidine (ZANAFLEX) 4 MG tablet Take 1 tablet by mouth  every 8 hours as needed  muscle spasms 12/07/14  Yes Ria Bush, MD  traMADol (ULTRAM) 50 MG tablet Take 1 tablet (50 mg total) by mouth every 12 (twelve) hours as needed for severe pain. 09/08/14  Yes Ria Bush, MD  VOLTAREN 1 %  GEL Apply 2 g topically as needed (FOR PAIN).  11/08/13  Yes Historical Provider, MD  ZETIA 10 MG tablet Take 10 mg by mouth daily.  01/12/14  Yes Historical Provider, MD  zolpidem (AMBIEN CR) 12.5 MG CR tablet Take 1 tablet (12.5 mg total) by mouth at bedtime as needed for sleep. 02/03/15 02/03/16 Yes Cloria Spring, MD   PHYSICAL EXAM: Filed Vitals:   02/23/15 1345 02/23/15 1415 02/23/15 1445 02/23/15 1520  BP: 133/71 123/70 122/68 104/55  Pulse: 74 77 79 71  Temp:      TempSrc:      Resp:    20  SpO2: 96% 99% 91% 94%    Wt Readings from Last 3 Encounters:  02/22/15 69.854 kg (154 lb)  02/21/15 69.174 kg (152 lb 8 oz)  02/03/15 68.493 kg (151 lb)    General:  Pleasant black female. Appears calm and comfortable Eyes: PER, normal lids, irises & conjunctiva ENT: grossly normal hearing, lips & tongue Neck: no LAD, no masses Cardiovascular: RRR, no murmurs. No LE edema.  Respiratory: Respirations even and unlabored. Normal respiratory effort. Lungs CTA bilaterally, no wheezes / rales .   Abdomen: soft, non-distended, non-tender, active bowel sounds. No obvious masses.  Skin: no rash seen on limited exam Musculoskeletal: grossly normal tone BUE/BLE Psychiatric: grossly normal mood and affect, speech fluent and appropriate Neurologic: grossly non-focal.         LABS ON ADMISSION:    Basic Metabolic Panel:  Recent Labs Lab 02/23/15 1559  NA 143  K  4.2  CL 109  GLUCOSE 136*  BUN 11  CREATININE 0.90    CBC:  Recent Labs Lab 02/23/15 1546 02/23/15 1559  WBC 9.5  --   HGB 10.7* 12.9  HCT 32.7* 38.0  MCV 82.6  --   PLT 175  --     CREATININE: 0.9 (02/23/15 1559) Estimated creatinine clearance - 57.9 mL/min  Radiological Exams on Admission: Dg Tibia/fibula Left  02/23/2015  CLINICAL DATA:  64 year old female with injury stepping out of cough are. Ankle pain extending up left leg. Initial encounter. EXAM: LEFT TIBIA AND FIBULA - 2 VIEW; LEFT ANKLE COMPLETE - 3+ VIEW COMPARISON:  None. FINDINGS: Left ankle three views: Trimalleolar comminuted fracture or dislocation. The talar dome is displaced laterally and posteriorly. Comminuted medial malleolar fracture fragment slightly displaced. Posterior distal tibial fracture fragments slightly displaced. Oblique fracture of the distal left fibula with overlapping of displaced fracture fragments. Left tibia -fibula: Besides the above described fracture, no proximal left tibia -fibular fracture noted. IMPRESSION: Left ankle three views: Trimalleolar comminuted fracture or dislocation. The talar dome is displaced laterally and posteriorly. Comminuted medial malleolar fracture fragment slightly displaced. Posterior distal tibial fracture fragments slightly displaced. Oblique fracture of the distal left fibula with overlapping of displaced fracture fragments. Left tibia -fibula: Besides the above described fracture, no proximal left tibia -fibular fracture noted. Electronically Signed   By: Genia Del M.D.   On: 02/23/2015 11:36   Dg Ankle Complete Left  02/23/2015  CLINICAL DATA:  64 year old female with injury stepping out of cough are. Ankle pain extending up left leg. Initial encounter. EXAM: LEFT TIBIA AND FIBULA - 2 VIEW; LEFT ANKLE COMPLETE - 3+ VIEW COMPARISON:  None. FINDINGS: Left ankle three views: Trimalleolar comminuted fracture or dislocation. The talar dome is displaced laterally and  posteriorly. Comminuted medial malleolar fracture fragment slightly displaced. Posterior distal tibial fracture fragments slightly displaced. Oblique fracture of the distal left fibula with overlapping  of displaced fracture fragments. Left tibia -fibula: Besides the above described fracture, no proximal left tibia -fibular fracture noted. IMPRESSION: Left ankle three views: Trimalleolar comminuted fracture or dislocation. The talar dome is displaced laterally and posteriorly. Comminuted medial malleolar fracture fragment slightly displaced. Posterior distal tibial fracture fragments slightly displaced. Oblique fracture of the distal left fibula with overlapping of displaced fracture fragments. Left tibia -fibula: Besides the above described fracture, no proximal left tibia -fibular fracture noted. Electronically Signed   By: Genia Del M.D.   On: 02/23/2015 11:36    Dg Ankle Left Port  02/23/2015  CLINICAL DATA:  64 year old female with ankle fracture post reduction. Subsequent encounter. EXAM: PORTABLE LEFT ANKLE - 2 VIEW COMPARISON:  02/23/2015. FINDINGS: Splint limits evaluation. Interval reduction with improved alignment of the distal left tibia and the talar dome. There remains slight incongruent the of the ankle mortise. Medial malleolar fracture slightly separated. Oblique fracture distal left fibula with foreshortening and slight separation. Question posterior tibial fracture. IMPRESSION: Interval reduction with improved alignment of the distal left tibia and the talar dome. There remains slight incongruent the of the ankle mortise. Medial malleolar fracture slightly separated. Oblique fracture distal left fibula with foreshortening and slight separation fracture fragments. Electronically Signed   By: Genia Del M.D.   On: 02/23/2015 15:21   Dg Ankle Left Port  02/23/2015  CLINICAL DATA:  Postreduction EXAM: PORTABLE LEFT ANKLE - 2 VIEW COMPARISON:  Prior film same day FINDINGS: Two views of the  left ankle submitted. Again noted displaced fracture of distal tibia and fibula. There is mild improvement in alignment. Persistent disruption of ankle mortise and anterior displacement of distal tibia with slight improvement. Study is limited by casting material artifact. IMPRESSION: Again noted displaced fracture of distal tibia and fibula. There is mild improvement in alignment. Persistent disruption of ankle mortise and anterior displacement of distal tibia with slight improvement. Study is limited by casting material artifact. Electronically Signed   By: Lahoma Crocker M.D.   On: 02/23/2015 15:01   Dg Ankle Left Port  02/23/2015  CLINICAL DATA:  Post reduction fracture dislocation EXAM: PORTABLE LEFT ANKLE - 2 VIEW COMPARISON:  Radiograph 02/23/2015 FINDINGS: Interval splinting. There is interval improvement of the articulation at the tibiotalar joint however the talus remains subluxed laterally in relation to the tibia. On the lateral projection the tibia is subluxed anterior in relation to the talus. Fractures of the lateral and medial malleolus again noted. IMPRESSION: 1. Improved alignment of the fracture dislocation of the LEFT ankle. 2. Significant subluxation remains as above. 3. Fine detail obscured by splint material. Electronically Signed   By: Suzy Bouchard M.D.   On: 02/23/2015 13:38   Dg Foot Complete Left  02/23/2015  CLINICAL DATA:  Fall with ankle injury EXAM: LEFT FOOT - COMPLETE 3+ VIEW COMPARISON:  Left ankle today FINDINGS: Fracture dislocation of the ankle. No fracture of the foot. Mild degenerative change in the first MTP joint. Early calcaneal spurring. IMPRESSION: Fracture dislocation of the ankle.  No foot fracture. Electronically Signed   By: Franchot Gallo M.D.   On: 02/23/2015 11:35   Echo June 2016  Study Conclusions  - Left ventricle: The cavity size was normal. Systolic function was normal. The estimated ejection fraction was in the range of 60% to 65%. Wall motion  was normal; there were no regional wall motion abnormalities. Features are consistent with a pseudonormal left ventricular filling pattern, with concomitant abnormal relaxation and increased filling pressure (grade  2 diastolic dysfunction). - Aortic valve: Valve mobility was restricted. There was mild stenosis. There was mild regurgitation. - Mitral valve: Mobility of the anterior leaflet was mildly restricted. Diastolic leaflet doming was present. The findings are consistent with mild stenosis. There was mild to moderate regurgitation directed posteriorly. Mean gradient (D): 6 mm Hg. Valve area by pressure half-time: 1.79 cm^2. - Left atrium: The atrium was moderately dilated.   ASSESSMENT / PLAN   Left trimalleolar ankle fracture, sustained after mechanical fall today. Orthopedics reduced fracture. Needs admission for pain control and surgery in 3-5 days.  - Admit to Medical bed. -analgesics -start colace to prevent constipation  Essential hypertension. - sbp 110, hold Norvasc for now. BP likely to fall with narcotic use -continue beta blocker   Sjogren's syndrome. On Plaquenil.  -continue home Plaquenil, eye drops  History of pulmonary embolism. Start Lovenox for DVT prophylaxis.  Diastolic dysfunction, grade 2 on echo in June.  Depression. Continue Lexapro   Refusal of blood transfusions as patient is Jehovah's Witness  CONSULTANTS:  Orthopedics   Code Status: full code DVT Prophylaxis: Lovenox Family Communication:    Disposition Plan: Discharge to home in 24-48 hours unless pain comes under control and Orthopedics decides to do surgery as outpatient.   Time spent: 60 minutes Tye Savoy  NP [facility] Pager [phone]

## 2015-02-23 NOTE — ED Notes (Signed)
Meal tray ordered 

## 2015-02-23 NOTE — ED Notes (Signed)
PA informed pt has no pulse in foot.

## 2015-02-23 NOTE — ED Notes (Addendum)
Consent signed; Ankle rolled into place; patient tolerated well; pulses returned. Ortho at bedside to splint.

## 2015-02-23 NOTE — ED Provider Notes (Signed)
CSN: [id number]     Arrival date & time 02/23/15  1023 History   First MD Initiated Contact with Patient 02/23/15 1033     Chief Complaint  Patient presents with  . Fall     (Consider location/radiation/quality/duration/timing/severity/associated sxs/prior Treatment) HPI   Alexandria Leach is a 64 y.o. female with PMH significant for HTN, HLD, fibromyalgia, sjogren's, CHF, CKD, DDD who presents with fall just PTA now complaining of constant, severe, worsening left ankle pain.  She states she was getting out of her car when she went to step out and she tripped on her cane causing her to roll her ankle and then fall to her left knee.  No LOC, head injury, CP, SOB, neck pain, back pain, numbness, tingling, or weakness.  Endorses swelling and left knee pain.  Per EMS, 50 fentanyl for pain.  Aggravating factors: bearing weight.   Past Medical History  Diagnosis Date  . Depression   . HTN (hypertension)   . Rheumatic heart disease 1980    s/p mitral valve repair 1980  . HLD (hyperlipidemia)   . Insomnia   . Anxiety   . History of DVT (deep vein thrombosis) several times latest 2012    receives coumadin while hospitalized  . History of pulmonary embolism 2001, 2006    completed coumadin courses  . Fibromyalgia   . Osteoarthritis     shoulders and knees, not RA per Dr Estanislado Pandy, positive ANA, positive Ro  . Personal history of urinary calculi latest 2014  . PONV (postoperative nausea and vomiting)   . Refusal of blood transfusions as patient is Jehovah's Witness   . Sjogren's syndrome ([place])   . CHF (congestive heart failure) ([place])   . Glaucoma     s/p surgery, sees ophtho Q6 mo  . CKD (chronic kidney disease) stage 3, GFR 30-59 ml/min     saw nephrologist Dr Harden Mo  . History of rheumatic fever x3  . DDD (degenerative disc disease), cervical   . Lung nodule 09/22/2012    RLL - 3mm, stable since 2014. Thought benign.    Past Surgical History  Procedure Laterality Date  . Mitral  valve repair  1980    open heart  . Tubal ligation  1980  . Vaginal hysterectomy  1992    for fibroids -- partial, ovaries remain  . Cholecystectomy  11/27/2011    Procedure: LAPAROSCOPIC CHOLECYSTECTOMY WITH INTRAOPERATIVE CHOLANGIOGRAM;  Surgeon: Adin Hector, MD;  Location: [facility];  Service: General;  Laterality: N/A;  laparoscopic cholecystectomy with choleangiogram umbilical hernia repair  . Umbilical hernia repair  11/27/2011    Procedure: HERNIA REPAIR UMBILICAL ADULT;  Surgeon: Adin Hector, MD;  Location: [facility];  Service: General;  Laterality: N/A;  . Breast biopsy Right 2006    benign  . Colonoscopy  07/2014    WNL Amedeo Plenty)  . Dexa  08/2012    normal per patient - no records available   Family History  Problem Relation Age of Onset  . Lupus Sister     and niece  . Cancer Mother     lung (nonsmoker)  . CAD Mother     MI in her 91s  . ALS Mother   . Kidney disease Father   . Alcohol abuse Father   . Diabetes Father   . Cancer Brother     bone  . Cancer Maternal Uncle     bone  . Depression Sister   . Kidney failure Other  on HD  . Diabetes Brother   . Diabetes Sister   . Stroke Maternal Grandmother   . Stroke Sister    Social History  Substance Use Topics  . Smoking status: Never Smoker   . Smokeless tobacco: Never Used  . Alcohol Use: No   OB History    No data available     Review of Systems  All other systems negative unless otherwise stated in HPI   Allergies  Cymbalta; Statins; and Sulfa antibiotics  Home Medications   Prior to Admission medications   Medication Sig Start Date End Date Taking? Authorizing Provider  amLODipine (NORVASC) 10 MG tablet Take 1 tablet by mouth  daily 01/25/15  Yes Ria Bush, MD  antiseptic oral rinse (BIOTENE) LIQD 15 mLs by Mouth Rinse route as needed (Oral hygiene).    Yes Historical Provider, MD  aspirin 325 MG EC tablet Take 325 mg by mouth every morning.    Yes Historical Provider, MD   cholecalciferol (VITAMIN D) 1000 UNITS tablet Take 1,000 Units by mouth every morning.    Yes Historical Provider, MD  clonazePAM (KLONOPIN) 1 MG tablet Take 1 tablet (1 mg total) by mouth 3 (three) times daily as needed for anxiety. 02/03/15  Yes Cloria Spring, MD  cycloSPORINE (RESTASIS) 0.05 % ophthalmic emulsion Place 1 drop into both eyes 2 (two) times daily. 07/26/14  Yes Ria Bush, MD  escitalopram (LEXAPRO) 20 MG tablet Take 1 tablet (20 mg total) by mouth daily. 02/03/15 02/03/16 Yes Cloria Spring, MD  famotidine (PEPCID) 20 MG tablet Take 1 tablet (20 mg total) by mouth 2 (two) times daily. 07/27/14  Yes Ria Bush, MD  furosemide (LASIX) 20 MG tablet TAKE 1 TABLET BY MOUTH  DAILY AS NEEDED FOR EDEMA. 01/25/15  Yes Ria Bush, MD  gabapentin (NEURONTIN) 300 MG capsule Take 1 tablet three times a day Patient taking differently: Take 300 mg by mouth 3 (three) times daily.  07/26/14  Yes Ria Bush, MD  hydroxychloroquine (PLAQUENIL) 200 MG tablet Take 1 tablet (200 mg total) by mouth 2 (two) times daily. 11/05/14  Yes Ria Bush, MD  metoprolol succinate (TOPROL-XL) 50 MG 24 hr tablet Take 1/2 tablet in the morning and 1 tablet at night time 07/26/14  Yes Ria Bush, MD  pilocarpine (SALAGEN) 5 MG tablet Take 0.5-1 tablets (2.5-5 mg total) by mouth 3 (three) times daily. 11/05/14  Yes Ria Bush, MD  Polyethyl Glycol-Propyl Glycol (SYSTANE) 0.4-0.3 % GEL Apply 1 drop to eye 2 (two) times daily.    Yes Historical Provider, MD  predniSONE (DELTASONE) 20 MG tablet Take two tablets daily for 3 days followed by one tablet daily for 4 days 02/21/15  Yes Ria Bush, MD  tiZANidine (ZANAFLEX) 4 MG tablet Take 1 tablet by mouth  every 8 hours as needed  muscle spasms 12/07/14  Yes Ria Bush, MD  traMADol (ULTRAM) 50 MG tablet Take 1 tablet (50 mg total) by mouth every 12 (twelve) hours as needed for severe pain. 09/08/14  Yes Ria Bush, MD   VOLTAREN 1 % GEL Apply 2 g topically as needed (FOR PAIN).  11/08/13  Yes Historical Provider, MD  ZETIA 10 MG tablet Take 10 mg by mouth daily.  01/12/14  Yes Historical Provider, MD  zolpidem (AMBIEN CR) 12.5 MG CR tablet Take 1 tablet (12.5 mg total) by mouth at bedtime as needed for sleep. 02/03/15 02/03/16 Yes Cloria Spring, MD   BP 122/68 mmHg  Pulse 79  Temp(Src) 98.2 F (36.8 C) (Oral)  SpO2 91% Physical Exam  Constitutional: She is oriented to person, place, and time. She appears well-developed and well-nourished.  HENT:  Head: Normocephalic and atraumatic.  Mouth/Throat: Oropharynx is clear and moist.  Eyes: Conjunctivae are normal. Pupils are equal, round, and reactive to light.  Neck: Normal range of motion. Neck supple.  Cardiovascular: Normal rate, regular rhythm and normal heart sounds.   No murmur heard. Pulmonary/Chest: Effort normal and breath sounds normal. No accessory muscle usage or stridor. No respiratory distress. She has no wheezes. She has no rhonchi. She has no rales.  Abdominal: Soft. Bowel sounds are normal. She exhibits no distension. There is no tenderness.  Musculoskeletal: She exhibits edema and tenderness.       Right knee: Normal.       Left knee: She exhibits normal range of motion, no swelling, no effusion, no ecchymosis, no deformity, no laceration, no erythema, normal alignment, no LCL laxity, normal patellar mobility and no bony tenderness. No tenderness found.       Right ankle: Normal.       Left ankle: She exhibits decreased range of motion, swelling and deformity. She exhibits no laceration and normal pulse. Tenderness. No lateral malleolus and no medial malleolus tenderness found. Achilles tendon normal.       Right foot: Normal.       Left foot: Normal.  Compartments are soft and compressible. Abrasion to left knee.  No active bleeding or surrounding erythema.   Lymphadenopathy:    She has no cervical adenopathy.  Neurological: She is  alert and oriented to person, place, and time.  Speech clear without dysarthria.  Skin: Skin is warm and dry.  Psychiatric: She has a normal mood and affect. Her behavior is normal.    ED Course  Procedures (including critical care time) Labs Review Labs Reviewed  CBC  I-STAT CHEM 8, ED    Imaging Review Dg Tibia/fibula Left  02/23/2015  CLINICAL DATA:  63 year old female with injury stepping out of cough are. Ankle pain extending up left leg. Initial encounter. EXAM: LEFT TIBIA AND FIBULA - 2 VIEW; LEFT ANKLE COMPLETE - 3+ VIEW COMPARISON:  None. FINDINGS: Left ankle three views: Trimalleolar comminuted fracture or dislocation. The talar dome is displaced laterally and posteriorly. Comminuted medial malleolar fracture fragment slightly displaced. Posterior distal tibial fracture fragments slightly displaced. Oblique fracture of the distal left fibula with overlapping of displaced fracture fragments. Left tibia -fibula: Besides the above described fracture, no proximal left tibia -fibular fracture noted. IMPRESSION: Left ankle three views: Trimalleolar comminuted fracture or dislocation. The talar dome is displaced laterally and posteriorly. Comminuted medial malleolar fracture fragment slightly displaced. Posterior distal tibial fracture fragments slightly displaced. Oblique fracture of the distal left fibula with overlapping of displaced fracture fragments. Left tibia -fibula: Besides the above described fracture, no proximal left tibia -fibular fracture noted. Electronically Signed   By: Genia Del M.D.   On: 02/23/2015 11:36   Dg Ankle Complete Left  02/23/2015  CLINICAL DATA:  64 year old female with injury stepping out of cough are. Ankle pain extending up left leg. Initial encounter. EXAM: LEFT TIBIA AND FIBULA - 2 VIEW; LEFT ANKLE COMPLETE - 3+ VIEW COMPARISON:  None. FINDINGS: Left ankle three views: Trimalleolar comminuted fracture or dislocation. The talar dome is displaced laterally  and posteriorly. Comminuted medial malleolar fracture fragment slightly displaced. Posterior distal tibial fracture fragments slightly displaced. Oblique fracture of the distal left fibula with overlapping of displaced  fracture fragments. Left tibia -fibula: Besides the above described fracture, no proximal left tibia -fibular fracture noted. IMPRESSION: Left ankle three views: Trimalleolar comminuted fracture or dislocation. The talar dome is displaced laterally and posteriorly. Comminuted medial malleolar fracture fragment slightly displaced. Posterior distal tibial fracture fragments slightly displaced. Oblique fracture of the distal left fibula with overlapping of displaced fracture fragments. Left tibia -fibula: Besides the above described fracture, no proximal left tibia -fibular fracture noted. Electronically Signed   By: Genia Del M.D.   On: 02/23/2015 11:36   Dg Knee Complete 4 Views Left  02/23/2015  CLINICAL DATA:  Fall, left ankle deformity, left knee pain EXAM: LEFT KNEE - COMPLETE 4+ VIEW COMPARISON:  12/24/2012 FINDINGS: Four views of the left knee submitted. No acute fracture or subluxation. No radiopaque foreign body. There is no joint effusion. IMPRESSION: Negative. Electronically Signed   By: Lahoma Crocker M.D.   On: 02/23/2015 11:27   Dg Ankle Left Port  02/23/2015  CLINICAL DATA:  Postreduction EXAM: PORTABLE LEFT ANKLE - 2 VIEW COMPARISON:  Prior film same day FINDINGS: Two views of the left ankle submitted. Again noted displaced fracture of distal tibia and fibula. There is mild improvement in alignment. Persistent disruption of ankle mortise and anterior displacement of distal tibia with slight improvement. Study is limited by casting material artifact. IMPRESSION: Again noted displaced fracture of distal tibia and fibula. There is mild improvement in alignment. Persistent disruption of ankle mortise and anterior displacement of distal tibia with slight improvement. Study is limited by  casting material artifact. Electronically Signed   By: Lahoma Crocker M.D.   On: 02/23/2015 15:01   Dg Ankle Left Port  02/23/2015  CLINICAL DATA:  Post reduction fracture dislocation EXAM: PORTABLE LEFT ANKLE - 2 VIEW COMPARISON:  Radiograph 02/23/2015 FINDINGS: Interval splinting. There is interval improvement of the articulation at the tibiotalar joint however the talus remains subluxed laterally in relation to the tibia. On the lateral projection the tibia is subluxed anterior in relation to the talus. Fractures of the lateral and medial malleolus again noted. IMPRESSION: 1. Improved alignment of the fracture dislocation of the LEFT ankle. 2. Significant subluxation remains as above. 3. Fine detail obscured by splint material. Electronically Signed   By: Suzy Bouchard M.D.   On: 02/23/2015 13:38   Dg Foot Complete Left  02/23/2015  CLINICAL DATA:  Fall with ankle injury EXAM: LEFT FOOT - COMPLETE 3+ VIEW COMPARISON:  Left ankle today FINDINGS: Fracture dislocation of the ankle. No fracture of the foot. Mild degenerative change in the first MTP joint. Early calcaneal spurring. IMPRESSION: Fracture dislocation of the ankle.  No foot fracture. Electronically Signed   By: Franchot Gallo M.D.   On: 02/23/2015 11:35   I have personally reviewed and evaluated these images and lab results as part of my medical decision-making.   EKG Interpretation None      MDM   Final diagnoses:  Left trimalleolar fracture, closed, initial encounter  Ankle fracture, left  Fall, initial encounter    Patient presents with mechanical fall.  VSS, NAD.  On exam, left ankle with deformity and swelling.  Intact DP pulses bilaterally.  Strength and sensation intact bilaterally distal to injury.  Plain films show evidence of trimalleolar comminuted fracture or dislocation of left ankle.  Left tib-fib shows oblique fx of distal left fibula with overlapping of displaced fx fragments.  No foot fx.  No left knee fx or  dislocation.  Will give fentanyl for  pain.  Will consult ortho.    12:16 PM: no palpable DP pulse.  Dr. Regenia Skeeter performed manual reduction, and pulses have returned.  Ankle dislocated again after reduction and before post-reduction films.  Dr. Regenia Skeeter will perform reduction with c-arms and obtain post-reduction films.  2:30 PM: Post reduction films show significant improved alignment of fx dislocation.  Significant subluxation remains.  Per ortho, successful reduction in ER, splinted, post reduction xrays ok, will admit to medicine.  Plan for surgery in 3-5 days.  Case has been discussed with and seen by Dr. Regenia Skeeter who agrees with the above plan for admission.   Gloriann Loan, PA-C 02/23/15 1635  Sherwood Gambler, MD 02/26/15 [phone]

## 2015-02-24 ENCOUNTER — Encounter (HOSPITAL_COMMUNITY): Payer: Self-pay | Admitting: General Practice

## 2015-02-24 DIAGNOSIS — Z9181 History of falling: Secondary | ICD-10-CM | POA: Insufficient documentation

## 2015-02-24 DIAGNOSIS — I10 Essential (primary) hypertension: Secondary | ICD-10-CM | POA: Diagnosis not present

## 2015-02-24 DIAGNOSIS — W19XXXA Unspecified fall, initial encounter: Secondary | ICD-10-CM | POA: Diagnosis not present

## 2015-02-24 DIAGNOSIS — S82851A Displaced trimalleolar fracture of right lower leg, initial encounter for closed fracture: Secondary | ICD-10-CM | POA: Diagnosis not present

## 2015-02-24 DIAGNOSIS — Z86711 Personal history of pulmonary embolism: Secondary | ICD-10-CM | POA: Diagnosis not present

## 2015-02-24 DIAGNOSIS — S82892A Other fracture of left lower leg, initial encounter for closed fracture: Secondary | ICD-10-CM | POA: Diagnosis not present

## 2015-02-24 DIAGNOSIS — E7800 Pure hypercholesterolemia, unspecified: Secondary | ICD-10-CM | POA: Diagnosis not present

## 2015-02-24 DIAGNOSIS — S82852A Displaced trimalleolar fracture of left lower leg, initial encounter for closed fracture: Secondary | ICD-10-CM | POA: Diagnosis not present

## 2015-02-24 DIAGNOSIS — I519 Heart disease, unspecified: Secondary | ICD-10-CM | POA: Diagnosis not present

## 2015-02-24 MED ORDER — HYDROCODONE-ACETAMINOPHEN 5-325 MG PO TABS: 1.0000 | ORAL_TABLET | ORAL | Status: DC | PRN

## 2015-02-24 NOTE — Evaluation (Signed)
Physical Therapy Evaluation Patient Details Name: Alexandria Leach MRN: [id number] DOB: 04/03/1950 Today's Date: 02/24/2015   History of Present Illness  Alexandria Leach is a 64 y.o. female with multiple medical problems. She fell after stepping onto her cane today. Now with left ankle fracture. Orthopedics reduced fracture.   Clinical Impression  Pt presents with dependencies in mobility secondary to left ankle fx. PT order stated imminent d/c. There is no WB order written in the chart; therefore, I educated pt and spouse on NWB. Pt was able to ambulate 59ft with min guard assist NWB. Pt was not able to hop up 3 steps in order for her to d/c home to her house. Pt and souse reported that they could have 2 strong guys lift her up if she is d/c home. They are currently declining a w/c and I did discussed the option of sitting down on the steps and bumping up. The patient reported her Ortho DR. has not visited today and she is unsure when her surgery will be scheduled. She is requesting to have the surgery now rather than d/c home. Pt educated on Knee scooter and is deciding if they will go ahead and rent one on their own since insurance does not cover the cost. My recommendation is to attempt steps again tomorrow and determine the safest option. Please clarify WB status. Pt would benefit from continued skilled PT for step training and to progress mobility and independence.    Follow Up Recommendations Home health PT    Equipment Recommendations  3in1 (PT)    Recommendations for Other Services       Precautions / Restrictions Precautions Precautions: Fall Required Braces or Orthoses: Other Brace/Splint Other Brace/Splint: L LE splint Restrictions Weight Bearing Restrictions: Yes LLE Weight Bearing: Non weight bearing Other Position/Activity Restrictions: no WB order in the chart, therefore, made pt NWB until clarified by Ortho      Mobility  Bed Mobility Overal bed mobility: Modified  Independent                Transfers Overall transfer level: Needs assistance Equipment used: Rolling walker (2 wheeled) Transfers: Sit to/from Stand Sit to Stand: Min guard         General transfer comment: cues for technique  Ambulation/Gait Ambulation/Gait assistance: Min guard Ambulation Distance (Feet): 15 Feet Assistive device: Rolling walker (2 wheeled) Gait Pattern/deviations: Step-to pattern   Gait velocity interpretation: Below normal speed for age/gender General Gait Details: L NWB, UE fatigue. Pt tried the knee scooter and was safer and demonstrated more independence with mobility.   Stairs Stairs: Yes Stairs assistance: +2 physical assistance Stair Management: No rails;Backwards;With walker Number of Stairs: 1 General stair comments: Pt was not able to hop up the second step and requested we stop. Pt/husband report they can have +2 people lift her up the steps into the house. I diescuused the possibility of getting a w/c or also sitting down on the steps and bumping up. The pt/husband agree they will just lift her up with 2 people assisting.  Wheelchair Mobility    Modified Rankin (Stroke Patients Only)       Balance Overall balance assessment: Needs assistance   Sitting balance-Leahy Scale: Good       Standing balance-Leahy Scale: Fair                               Pertinent Vitals/Pain Pain Assessment: 0-10 Pain Score:  6  Pain Descriptors / Indicators: Aching Pain Intervention(s): Limited activity within patient's tolerance;Monitored during session;Repositioned    Home Living Family/patient expects to be discharged to:: Private residence Living Arrangements: Spouse/significant other Available Help at Discharge: Family Type of Home: House Home Access: Stairs to enter Entrance Stairs-Rails: None Technical brewer of Steps: 3 Home Layout: One level Home Equipment: Environmental consultant - 4 wheels      Prior Function Level of  Independence: Independent               Hand Dominance        Extremity/Trunk Assessment   Upper Extremity Assessment: Defer to OT evaluation           Lower Extremity Assessment: Difficult to assess due to impaired cognition         Communication   Communication: No difficulties  Cognition Arousal/Alertness: Awake/alert Behavior During Therapy: WFL for tasks assessed/performed Overall Cognitive Status: Within Functional Limits for tasks assessed                      General Comments      Exercises        Assessment/Plan    PT Assessment Patient needs continued PT services  PT Diagnosis Difficulty walking;Acute pain   PT Problem List Decreased strength;Decreased activity tolerance;Decreased balance;Decreased mobility;Decreased knowledge of use of DME;Pain  PT Treatment Interventions DME instruction;Gait training;Stair training;Functional mobility training;Therapeutic activities;Therapeutic exercise;Balance training;Patient/family education   PT Goals (Current goals can be found in the Care Plan section) Acute Rehab PT Goals Patient Stated Goal: To have surgery and walk again. PT Goal Formulation: With patient Time For Goal Achievement: 03/03/15 Potential to Achieve Goals: Good    Frequency Min 6X/week   Barriers to discharge        Co-evaluation               End of Session Equipment Utilized During Treatment: Gait belt Activity Tolerance: Patient tolerated treatment well;Patient limited by fatigue Patient left: in bed;with call bell/phone within reach;with family/visitor present Nurse Communication: Mobility status    Functional Assessment Tool Used: clinincal observation Functional Limitation: Mobility: Walking and moving around Mobility: Walking and Moving Around Current Status [id number]): At least 1 percent but less than 20 percent impaired, limited or restricted Mobility: Walking and Moving Around Goal Status [phone]): 0 percent  impaired, limited or restricted    Time: 1150-1236 PT Time Calculation (min) (ACUTE ONLY): 46 min   Charges:   PT Evaluation $Initial PT Evaluation Tier I: 1 Procedure PT Treatments $Gait Training: 8-22 mins $Therapeutic Activity: 8-22 mins   PT G Codes:   PT G-Codes **NOT FOR INPATIENT CLASS** Functional Assessment Tool Used: clinincal observation Functional Limitation: Mobility: Walking and moving around Mobility: Walking and Moving Around Current Status [id number]): At least 1 percent but less than 20 percent impaired, limited or restricted Mobility: Walking and Moving Around Goal Status [phone]): 0 percent impaired, limited or restricted    Lelon Mast 02/24/2015, 12:40 PM

## 2015-02-24 NOTE — Progress Notes (Signed)
Patient ID: Alexandria Leach, female   DOB: [birth year], 64 y.o.   MRN: [id number]  [facility] PROGRESS NOTE  Alexandria Leach [id number] DOB: [birth year] DOA: 03/02/2015 PCP: Ria Bush, MD   Brief narrative:    64 y.o. female with multiple medical problems. She fell after stepping onto her cane today. Now with left ankle fracture. Orthopedics reduced fracture in ED but recommended surgery.   Assessment/Plan:    Principal Problem:   Fracture of ankle, trimalleolar after mechanical fall  - appreciate ortho team following - ankle was reduced in ED but plan for surgery on Sunday  - provide analgesia as needed   Active Problems:   Essential hypertension - reasonable inpatient control     Refusal of blood transfusions as patient is Jehovah's Witness - no indication for transfusion at this time     Sjogren's syndrome ([place]) - continue Plaquenil     History of pulmonary embolism - currently on Lovenox SQ for DVT prophylaxis     Chronic diastolic CHF, grade II - last 2 D ECHO in June 2016 - no signs of volume overload  - weight 69 kg this AM  DVT prophylaxis - Lovenox SQ  Code Status: Full.  Family Communication:  plan of care discussed with the patient Disposition Plan: Home after surgery is done so not before 12/12  IV access:  Peripheral IV  Procedures and diagnostic studies:    Dg Tibia/fibula Left 02-Mar-2015 Left ankle three views: Trimalleolar comminuted fracture or dislocation. The talar dome is displaced laterally and posteriorly. Comminuted medial malleolar fracture fragment slightly displaced. Posterior distal tibial fracture fragments slightly displaced. Oblique fracture of the distal left fibula with overlapping of displaced fracture fragments. Left tibia -fibula: Besides the above described fracture, no proximal left tibia -fibular fracture noted  Ct Ankle Left Wo Contrast 03/02/2015  Comminuted trimalleolar fracture of the left ankle as described  above. 12 mm of lateral subluxation of the talar dome relative to the tibial plafond.   Dg Knee Complete 4 Views Left 2015-03-02 Negative.   Dg Ankle Left Port 03-02-2015  gain noted displaced fracture of distal tibia and fibula. There is mild improvement in alignment. Persistent disruption of ankle mortise and anterior displacement of distal tibia with slight improvement.   Dg Ankle Left Port 03-02-2015  Improved alignment of the fracture dislocation of the LEFT ankle. 2. Significant subluxation remains as above. 3. Fine detail obscured by splint material.  Dg Foot Complete Left Mar 02, 2015 Fracture dislocation of the ankle.  No foot fracture.  Medical Consultants:  Ortho   Other Consultants:  PT  IAnti-Infectives:   None   Faye Ramsay, MD  [facility] Pager [phone]  If 7PM-7AM, please contact night-coverage [email or web address] Password [email or web address] 02/24/2015, 7:08 PM   LOS: 1 day   HPI/Subjective: No events overnight.   Objective: Filed Vitals:   2015-03-02 2043 02/24/15 0551 02/24/15 0850 02/24/15 1516  BP: 102/53 139/70 125/70 116/54  Pulse: 67 77 71 72  Temp: 98.1 F (36.7 C) 98.6 F (37 C)  98.7 F (37.1 C)  TempSrc: Oral Oral  Oral  Resp: 18 18  18   SpO2: 98% 99%  95%    Intake/Output Summary (Last 24 hours) at 02/24/15 1908 Last data filed at 02/24/15 0830  Gross per 24 hour  Intake 1207.5 ml  Output      1 ml  Net 1206.5 ml    Exam:   General:  Pt is alert, follows commands appropriately, not in acute distress  Cardiovascular: Regular rate and rhythm, no rubs, no gallops  Respiratory: Clear to auscultation bilaterally, no wheezing, no crackles, no rhonchi  Abdomen: Soft, non tender, non distended, bowel sounds present, no guarding  Extremities: Left ankle swelling and TTP   Data Reviewed: Basic Metabolic Panel:  Recent Labs Lab 02/23/15 1559  NA 143  K 4.2  CL 109  GLUCOSE 136*  BUN 11  CREATININE 0.90   Liver Function Tests: No results for  input(s): AST, ALT, ALKPHOS, BILITOT, PROT, ALBUMIN in the last 168 hours. No results for input(s): LIPASE, AMYLASE in the last 168 hours. No results for input(s): AMMONIA in the last 168 hours. CBC:  Recent Labs Lab 02/23/15 1546 02/23/15 1559  WBC 9.5  --   HGB 10.7* 12.9  HCT 32.7* 38.0  MCV 82.6  --   PLT 175  --    Cardiac Enzymes: No results for input(s): CKTOTAL, CKMB, CKMBINDEX, TROPONINI in the last 168 hours. BNP: Invalid input(s): POCBNP CBG: No results for input(s): GLUCAP in the last 168 hours.  No results found for this or any previous visit (from the past 240 hour(s)).   Scheduled Meds: . cycloSPORINE  1 drop Both Eyes BID  . docusate sodium  100 mg Oral BID  . enoxaparin (LOVENOX) injection  40 mg Subcutaneous Q24H  . escitalopram  20 mg Oral Daily  . ezetimibe  10 mg Oral Daily  . famotidine  20 mg Oral BID  . gabapentin  300 mg Oral TID  .  HYDROmorphone (DILAUDID) injection  1 mg Intravenous Once  . hydroxychloroquine  200 mg Oral BID  . metoprolol succinate  25 mg Oral BID  . pilocarpine  2.5 mg Oral Q24H  . pilocarpine  5 mg Oral Q0600   Continuous Infusions: . sodium chloride 75 mL/hr at 02/23/15 1840

## 2015-02-24 NOTE — Care Management Note (Signed)
Case Management Note  Patient Details  Name: LAVERNE BULLIS MRN: [id number] Date of Birth: Jan 20, 1951  Subjective/Objective:   64 yr old female admitted s/p fall with aleft ankle fracture. Closed reduction was performed in the ED. Due to amount of swelling, surgery will not be done until Sunday, 02/26/15.    Action/Plan: Case manager spoke with patient and her husband concerning need for home health when discharged. Choice was offered. Referral was called to [facility], [facility]. Patient states she has a rolling walker and 3in1 at home. Case manager will continue to monitor.     Expected Discharge Date:                  Expected Discharge Plan:     In-House Referral:  NA  Discharge planning Services  CM Consult  Post Acute Care Choice:    Choice offered to:  Patient  DME Arranged:  N/A DME Agency:  NA  HH Arranged:  PT, Nurse's Aide [place] Agency:   ([place])  Status of Service:  In process, will continue to follow  Medicare Important Message Given:    Date Medicare IM Given:    Medicare IM give by:    Date Additional Medicare IM Given:    Additional Medicare Important Message give by:     If discussed at [place] of Stay Meetings, dates discussed:    Additional Comments:  Ninfa Meeker, RN 02/24/2015, 2:29 PM

## 2015-02-24 NOTE — Progress Notes (Signed)
Spoke with patient about treatment plan.  Plan is for surgery Sunday am.  Patient is agreeable and consent obtained.

## 2015-02-25 DIAGNOSIS — E7800 Pure hypercholesterolemia, unspecified: Secondary | ICD-10-CM | POA: Diagnosis not present

## 2015-02-25 DIAGNOSIS — S82892A Other fracture of left lower leg, initial encounter for closed fracture: Secondary | ICD-10-CM | POA: Diagnosis not present

## 2015-02-25 DIAGNOSIS — Z86711 Personal history of pulmonary embolism: Secondary | ICD-10-CM

## 2015-02-25 DIAGNOSIS — W19XXXA Unspecified fall, initial encounter: Secondary | ICD-10-CM | POA: Diagnosis not present

## 2015-02-25 LAB — CBC
HCT: 32.4 % — ABNORMAL LOW (ref 36.0–46.0)
Hemoglobin: 10.6 g/dL — ABNORMAL LOW (ref 12.0–15.0)
MCH: 27 pg (ref 26.0–34.0)
MCHC: 32.7 g/dL (ref 30.0–36.0)
MCV: 82.7 fL (ref 78.0–100.0)
PLATELETS: 167 10*3/uL (ref 150–400)
RBC: 3.92 MIL/uL (ref 3.87–5.11)
RDW: 14.3 % (ref 11.5–15.5)
WBC: 8.6 10*3/uL (ref 4.0–10.5)

## 2015-02-25 LAB — BASIC METABOLIC PANEL
ANION GAP: 8 (ref 5–15)
BUN: 9 mg/dL (ref 6–20)
CALCIUM: 8.8 mg/dL — AB (ref 8.9–10.3)
CO2: 25 mmol/L (ref 22–32)
Chloride: 108 mmol/L (ref 101–111)
Creatinine, Ser: 0.99 mg/dL (ref 0.44–1.00)
GFR, EST NON AFRICAN AMERICAN: 60 mL/min — AB (ref >60)
GLUCOSE: 92 mg/dL (ref 65–99)
POTASSIUM: 3.6 mmol/L (ref 3.5–5.1)
SODIUM: 141 mmol/L (ref 135–145)

## 2015-02-25 LAB — MRSA PCR SCREENING: MRSA BY PCR: NEGATIVE

## 2015-02-25 MED ORDER — HYDROCODONE-ACETAMINOPHEN 5-325 MG PO TABS
1.0000 | ORAL_TABLET | ORAL | Status: DC | PRN
  Administered 2015-02-25 – 2015-02-27 (×5): 1 via ORAL
  Filled 2015-02-25 (×5): qty 1

## 2015-02-25 MED ORDER — ACETAMINOPHEN 325 MG PO TABS
650.0000 mg | ORAL_TABLET | Freq: Four times a day (QID) | ORAL | Status: DC | PRN
  Administered 2015-02-25 (×2): 650 mg via ORAL
  Filled 2015-02-25 (×2): qty 2

## 2015-02-25 NOTE — Progress Notes (Addendum)
Patient ID: Alexandria Leach, female   DOB: [birth year], 64 y.o.   MRN: [id number]  [facility] PROGRESS NOTE  Alexandria Leach [id number] DOB: 30-Jul-1950 DOA: 2015/03/18 PCP: Ria Bush, MD   Brief narrative:    64 y.o. female with multiple medical problems. She fell after stepping onto her cane today. Now with left ankle fracture. Orthopedics reduced fracture in ED but recommended surgery.   Assessment/Plan:    Principal Problem:   Fracture of the left ankle, trimalleolar after mechanical fall  - appreciate ortho team following - plan for ORIF in AM, NPO after midnight  - provide analgesia as needed   Active Problems:   Essential hypertension - reasonable inpatient control     Refusal of blood transfusions as patient is Jehovah's Witness - no indication for transfusion at this time     Sjogren's syndrome ([place]) - continue Plaquenil     History of pulmonary embolism - currently on Lovenox SQ for DVT prophylaxis     Chronic diastolic CHF, grade II - last 2 D ECHO in June 2016 - no signs of volume overload  - weight 69 kg this AM  DVT prophylaxis - Lovenox SQ  Code Status: Full.  Family Communication:  plan of care discussed with the patient Disposition Plan: Home after surgery is done so not before 12/11  IV access:  Peripheral IV  Procedures and diagnostic studies:    Dg Tibia/fibula Left 03-18-15 Left ankle three views: Trimalleolar comminuted fracture or dislocation. The talar dome is displaced laterally and posteriorly. Comminuted medial malleolar fracture fragment slightly displaced. Posterior distal tibial fracture fragments slightly displaced. Oblique fracture of the distal left fibula with overlapping of displaced fracture fragments. Left tibia -fibula: Besides the above described fracture, no proximal left tibia -fibular fracture noted  Ct Ankle Left Wo Contrast March 18, 2015  Comminuted trimalleolar fracture of the left ankle as described above. 12  mm of lateral subluxation of the talar dome relative to the tibial plafond.   Dg Knee Complete 4 Views Left 03-18-15 Negative.   Dg Ankle Left Port Mar 18, 2015  gain noted displaced fracture of distal tibia and fibula. There is mild improvement in alignment. Persistent disruption of ankle mortise and anterior displacement of distal tibia with slight improvement.   Dg Ankle Left Port 03/18/15  Improved alignment of the fracture dislocation of the LEFT ankle. 2. Significant subluxation remains as above. 3. Fine detail obscured by splint material.  Dg Foot Complete Left March 18, 2015 Fracture dislocation of the ankle.  No foot fracture.  Medical Consultants:  Ortho   Other Consultants:  PT  IAnti-Infectives:   None   Faye Ramsay, MD  [facility] Pager [phone]  If 7PM-7AM, please contact night-coverage [email or web address] Password [email or web address] 02/25/2015, 12:52 PM   LOS: 2 days   HPI/Subjective: No events overnight.   Objective: Filed Vitals:   02/24/15 1516 02/24/15 2036 02/24/15 2357 02/25/15 0545  BP: 116/54 125/52 132/73 115/58  Pulse: 72 75 82 79  Temp: 98.7 F (37.1 C) 98.9 F (37.2 C) 99.8 F (37.7 C) 100.2 F (37.9 C)  TempSrc: Oral Oral Oral Oral  Resp: 18 18 16 16   SpO2: 95% 98% 100% 100%    Intake/Output Summary (Last 24 hours) at 02/25/15 1252 Last data filed at 02/25/15 0836  Gross per 24 hour  Intake    480 ml  Output      2 ml  Net    478 ml    Exam:   General:  Pt is alert,  follows commands appropriately, not in acute distress  Cardiovascular: Regular rate and rhythm, no rubs, no gallops  Respiratory: Clear to auscultation bilaterally, no wheezing, no crackles, no rhonchi  Abdomen: Soft, non tender, non distended, bowel sounds present, no guarding  Extremities: Left ankle swelling and TTP   Data Reviewed: Basic Metabolic Panel:  Recent Labs Lab 02/23/15 1559 02/25/15 0307  NA 143 141  K 4.2 3.6  CL 109 108  CO2  --  25  GLUCOSE 136* 92   BUN 11 9  CREATININE 0.90 0.99  CALCIUM  --  8.8*   Liver Function Tests: No results for input(s): AST, ALT, ALKPHOS, BILITOT, PROT, ALBUMIN in the last 168 hours. No results for input(s): LIPASE, AMYLASE in the last 168 hours. No results for input(s): AMMONIA in the last 168 hours. CBC:  Recent Labs Lab 02/23/15 1546 02/23/15 1559 02/25/15 0307  WBC 9.5  --  8.6  HGB 10.7* 12.9 10.6*  HCT 32.7* 38.0 32.4*  MCV 82.6  --  82.7  PLT 175  --  167    Scheduled Meds: . cycloSPORINE  1 drop Both Eyes BID  . docusate sodium  100 mg Oral BID  . escitalopram  20 mg Oral Daily  . ezetimibe  10 mg Oral Daily  . famotidine  20 mg Oral BID  . gabapentin  300 mg Oral TID  .  HYDROmorphone (DILAUDID) injection  1 mg Intravenous Once  . hydroxychloroquine  200 mg Oral BID  . metoprolol succinate  25 mg Oral BID  . pilocarpine  2.5 mg Oral Q24H  . pilocarpine  5 mg Oral Q0600   Continuous Infusions:

## 2015-02-25 NOTE — Progress Notes (Signed)
Patient ID: Alexandria Leach, female   DOB: 1950/04/07, 64 y.o.   MRN: [id number] Patient with trimalleolar ankle fracture. Plan for open reduction internal fixation Sunday morning. Nothing by mouth after midnight tonight.

## 2015-02-25 NOTE — Progress Notes (Signed)
Physical Therapy Treatment Patient Details Name: Alexandria Leach MRN: [id number] DOB: [birth year] Today's Date: 02/25/2015    History of Present Illness CHOLE RION is a 64 y.o. female with multiple medical problems. She fell after stepping onto her cane today. Now with left ankle fracture. Orthopedics reduced fracture. Surgery planned for 12/11.    PT Comments    Patient progressing well towards PT goals. Improved ambulation distance today with Min guard assist for safety. Continues to fatigue with UEs. Will practice with knee walker next session to allow for increased distance. Pt needs to practice stair training prior to discharge- reports feeling anxious. Will follow acutely per current POC. Surgery planned for 12/11.  Follow Up Recommendations  Home health PT     Equipment Recommendations  3in1 (PT)    Recommendations for Other Services       Precautions / Restrictions Precautions Precautions: Fall Required Braces or Orthoses: Other Brace/Splint Other Brace/Splint: L LE splint Restrictions Weight Bearing Restrictions: Yes LLE Weight Bearing: Non weight bearing    Mobility  Bed Mobility               General bed mobility comments: Sitting in chair upon PT arrival.  Transfers Overall transfer level: Needs assistance Equipment used: Rolling walker (2 wheeled) Transfers: Sit to/from Stand Sit to Stand: Min guard         General transfer comment: cues for technique. Min guard for safety. Compliant with NWB LLE.  Ambulation/Gait Ambulation/Gait assistance: Min guard Ambulation Distance (Feet): 32 Feet Assistive device: Rolling walker (2 wheeled) Gait Pattern/deviations: Step-to pattern ("hop to" gait pattern)   Gait velocity interpretation: <1.8 ft/sec, indicative of risk for recurrent falls General Gait Details: Cues to keep LLE in knee flexion for energy conservation. Fatigues.    Stairs            Wheelchair Mobility    Modified Rankin  (Stroke Patients Only)       Balance Overall balance assessment: Needs assistance Sitting-balance support: Feet supported;No upper extremity supported Sitting balance-Leahy Scale: Good     Standing balance support: During functional activity Standing balance-Leahy Scale: Poor Standing balance comment: Relient on RW for support.                     Cognition Arousal/Alertness: Awake/alert Behavior During Therapy: WFL for tasks assessed/performed Overall Cognitive Status: Within Functional Limits for tasks assessed                      Exercises General Exercises - Lower Extremity Quad Sets: Both;10 reps;Seated Long Arc Quad: Left;10 reps;Seated Hip ABduction/ADduction: Left;10 reps;Seated Straight Leg Raises: Left;10 reps;Seated Hip Flexion/Marching: Left;10 reps;Seated    General Comments        Pertinent Vitals/Pain Pain Assessment: No/denies pain    Home Living                      Prior Function            PT Goals (current goals can now be found in the care plan section) Progress towards PT goals: Progressing toward goals    Frequency  Min 6X/week    PT Plan Current plan remains appropriate    Co-evaluation             End of Session Equipment Utilized During Treatment: Gait belt Activity Tolerance: Patient tolerated treatment well Patient left: in chair;with call bell/phone within reach     Time: [phone]  PT Time Calculation (min) (ACUTE ONLY): 15 min  Charges:  $Gait Training: 8-22 mins                    G Codes:      Tani Virgo A Satin Boal 02/25/2015, 9:27 AM Wray Kearns, PT, DPT [phone]

## 2015-02-25 NOTE — Progress Notes (Signed)
Around 2350 this evening, nurse called to room, tech found patient sitting on the floor.  Patient called for assistance to [facility] but did not wait, tried to reach for walker and slide from sitting on the bed to the floor.  She sustained zero new injuries. Vitals are stable, patient alert and verbal.  On call contacted via Amion - no new orders given, patient's husband contacted.

## 2015-02-25 NOTE — Progress Notes (Signed)
   02/25/15 0010  Follow Up  Progress note created (see row info) Yes    See previous note.

## 2015-02-26 ENCOUNTER — Encounter (HOSPITAL_COMMUNITY)
Admission: EM | Disposition: A | Discharge status: Home / Self Care | Payer: Self-pay | Source: Home / Self Care | Attending: Internal Medicine

## 2015-02-26 ENCOUNTER — Observation Stay (HOSPITAL_COMMUNITY): Payer: Medicare Other | Admitting: Anesthesiology

## 2015-02-26 ENCOUNTER — Observation Stay (HOSPITAL_COMMUNITY): Payer: Medicare Other

## 2015-02-26 ENCOUNTER — Encounter (HOSPITAL_COMMUNITY): Payer: Self-pay | Admitting: Certified Registered Nurse Anesthetist

## 2015-02-26 DIAGNOSIS — I5032 Chronic diastolic (congestive) heart failure: Secondary | ICD-10-CM | POA: Diagnosis not present

## 2015-02-26 DIAGNOSIS — M797 Fibromyalgia: Secondary | ICD-10-CM | POA: Diagnosis not present

## 2015-02-26 DIAGNOSIS — I509 Heart failure, unspecified: Secondary | ICD-10-CM | POA: Diagnosis not present

## 2015-02-26 DIAGNOSIS — D62 Acute posthemorrhagic anemia: Secondary | ICD-10-CM | POA: Diagnosis not present

## 2015-02-26 DIAGNOSIS — W19XXXA Unspecified fall, initial encounter: Secondary | ICD-10-CM | POA: Diagnosis not present

## 2015-02-26 DIAGNOSIS — S82852B Displaced trimalleolar fracture of left lower leg, initial encounter for open fracture type I or II: Secondary | ICD-10-CM | POA: Diagnosis not present

## 2015-02-26 DIAGNOSIS — E7800 Pure hypercholesterolemia, unspecified: Secondary | ICD-10-CM | POA: Diagnosis not present

## 2015-02-26 DIAGNOSIS — S82852A Displaced trimalleolar fracture of left lower leg, initial encounter for closed fracture: Principal | ICD-10-CM

## 2015-02-26 DIAGNOSIS — I10 Essential (primary) hypertension: Secondary | ICD-10-CM | POA: Diagnosis not present

## 2015-02-26 DIAGNOSIS — I519 Heart disease, unspecified: Secondary | ICD-10-CM

## 2015-02-26 DIAGNOSIS — S82852D Displaced trimalleolar fracture of left lower leg, subsequent encounter for closed fracture with routine healing: Secondary | ICD-10-CM | POA: Diagnosis not present

## 2015-02-26 DIAGNOSIS — S82853A Displaced trimalleolar fracture of unspecified lower leg, initial encounter for closed fracture: Secondary | ICD-10-CM

## 2015-02-26 DIAGNOSIS — I130 Hypertensive heart and chronic kidney disease with heart failure and stage 1 through stage 4 chronic kidney disease, or unspecified chronic kidney disease: Secondary | ICD-10-CM | POA: Diagnosis not present

## 2015-02-26 DIAGNOSIS — S82892A Other fracture of left lower leg, initial encounter for closed fracture: Secondary | ICD-10-CM | POA: Diagnosis not present

## 2015-02-26 DIAGNOSIS — S8262XA Displaced fracture of lateral malleolus of left fibula, initial encounter for closed fracture: Secondary | ICD-10-CM | POA: Diagnosis not present

## 2015-02-26 HISTORY — PX: ORIF ANKLE FRACTURE: SHX5408

## 2015-02-26 LAB — CBC
HEMATOCRIT: 34.7 % — AB (ref 36.0–46.0)
Hemoglobin: 11.5 g/dL — ABNORMAL LOW (ref 12.0–15.0)
MCH: 27.4 pg (ref 26.0–34.0)
MCHC: 33.1 g/dL (ref 30.0–36.0)
MCV: 82.8 fL (ref 78.0–100.0)
Platelets: 185 10*3/uL (ref 150–400)
RBC: 4.19 MIL/uL (ref 3.87–5.11)
RDW: 14.4 % (ref 11.5–15.5)
WBC: 7.4 10*3/uL (ref 4.0–10.5)

## 2015-02-26 LAB — BASIC METABOLIC PANEL
Anion gap: 11 (ref 5–15)
BUN: 8 mg/dL (ref 6–20)
CALCIUM: 9.1 mg/dL (ref 8.9–10.3)
CO2: 21 mmol/L — AB (ref 22–32)
CREATININE: 1.26 mg/dL — AB (ref 0.44–1.00)
Chloride: 108 mmol/L (ref 101–111)
GFR calc Af Amer: 52 mL/min — ABNORMAL LOW (ref >60)
GFR calc non Af Amer: 45 mL/min — ABNORMAL LOW (ref >60)
GLUCOSE: 114 mg/dL — AB (ref 65–99)
Potassium: 3.6 mmol/L (ref 3.5–5.1)
Sodium: 140 mmol/L (ref 135–145)

## 2015-02-26 SURGERY — OPEN REDUCTION INTERNAL FIXATION (ORIF) ANKLE FRACTURE
Anesthesia: General | Site: Ankle | Laterality: Left

## 2015-02-26 MED ORDER — DIPHENHYDRAMINE HCL 12.5 MG/5ML PO ELIX: 25.0000 mg | ORAL_SOLUTION | ORAL | Status: DC | PRN

## 2015-02-26 MED ORDER — ACETAMINOPHEN 325 MG PO TABS: 650.0000 mg | ORAL_TABLET | Freq: Four times a day (QID) | ORAL | Status: DC | PRN

## 2015-02-26 MED ORDER — ENOXAPARIN SODIUM 40 MG/0.4ML ~~LOC~~ SOLN: 40.0000 mg | Freq: Every day | SUBCUTANEOUS | Status: DC

## 2015-02-26 MED ORDER — MIDAZOLAM HCL 5 MG/5ML IJ SOLN
INTRAMUSCULAR | Status: DC | PRN
  Administered 2015-02-26 (×2): 1 mg via INTRAVENOUS

## 2015-02-26 MED ORDER — OXYCODONE HCL 5 MG PO TABS
5.0000 mg | ORAL_TABLET | ORAL | Status: DC | PRN
  Administered 2015-02-26: 10 mg via ORAL
  Filled 2015-02-26: qty 2

## 2015-02-26 MED ORDER — ONDANSETRON HCL 4 MG PO TABS: 4.0000 mg | ORAL_TABLET | Freq: Four times a day (QID) | ORAL | Status: DC | PRN

## 2015-02-26 MED ORDER — HYDROCODONE-ACETAMINOPHEN 7.5-325 MG PO TABS: 1.0000 | ORAL_TABLET | Freq: Four times a day (QID) | ORAL | Status: DC | PRN

## 2015-02-26 MED ORDER — DEXAMETHASONE SODIUM PHOSPHATE 10 MG/ML IJ SOLN
INTRAMUSCULAR | Status: DC | PRN
  Administered 2015-02-26: 10 mg via INTRAVENOUS

## 2015-02-26 MED ORDER — PILOCARPINE HCL 5 MG PO TABS
2.5000 mg | ORAL_TABLET | Freq: Every day | ORAL | Status: DC
  Filled 2015-02-26: qty 1

## 2015-02-26 MED ORDER — HYDROCODONE-ACETAMINOPHEN 7.5-325 MG PO TABS: 1.0000 | ORAL_TABLET | Freq: Once | ORAL | Status: DC | PRN

## 2015-02-26 MED ORDER — PROPOFOL 10 MG/ML IV BOLUS
INTRAVENOUS | Status: DC | PRN
  Administered 2015-02-26: 20 mg via INTRAVENOUS
  Administered 2015-02-26: 150 mg via INTRAVENOUS
  Administered 2015-02-26: 10 mg via INTRAVENOUS

## 2015-02-26 MED ORDER — CEFAZOLIN SODIUM-DEXTROSE 2-3 GM-% IV SOLR
2.0000 g | Freq: Four times a day (QID) | INTRAVENOUS | Status: AC
  Administered 2015-02-26 – 2015-02-27 (×3): 2 g via INTRAVENOUS
  Filled 2015-02-26 (×3): qty 50

## 2015-02-26 MED ORDER — SODIUM CHLORIDE 0.9 % IV SOLN
INTRAVENOUS | Status: DC
  Administered 2015-02-26 (×2): via INTRAVENOUS

## 2015-02-26 MED ORDER — 0.9 % SODIUM CHLORIDE (POUR BTL) OPTIME
TOPICAL | Status: DC | PRN
  Administered 2015-02-26: 1000 mL

## 2015-02-26 MED ORDER — PREDNISONE 20 MG PO TABS
20.0000 mg | ORAL_TABLET | Freq: Every day | ORAL | Status: DC
  Administered 2015-02-26 – 2015-02-27 (×2): 20 mg via ORAL
  Filled 2015-02-26 (×2): qty 1

## 2015-02-26 MED ORDER — LIDOCAINE HCL (CARDIAC) 20 MG/ML IV SOLN
INTRAVENOUS | Status: AC
  Filled 2015-02-26: qty 5

## 2015-02-26 MED ORDER — METOCLOPRAMIDE HCL 5 MG PO TABS: 5.0000 mg | ORAL_TABLET | Freq: Three times a day (TID) | ORAL | Status: DC | PRN

## 2015-02-26 MED ORDER — BUPIVACAINE HCL (PF) 0.25 % IJ SOLN
INTRAMUSCULAR | Status: AC
  Filled 2015-02-26: qty 30

## 2015-02-26 MED ORDER — ENOXAPARIN SODIUM 40 MG/0.4ML ~~LOC~~ SOLN
40.0000 mg | SUBCUTANEOUS | Status: DC
  Administered 2015-02-27: 40 mg via SUBCUTANEOUS
  Filled 2015-02-26: qty 0.4

## 2015-02-26 MED ORDER — ACETAMINOPHEN 650 MG RE SUPP: 650.0000 mg | Freq: Four times a day (QID) | RECTAL | Status: DC | PRN

## 2015-02-26 MED ORDER — PHENYLEPHRINE HCL 10 MG/ML IJ SOLN
INTRAMUSCULAR | Status: DC | PRN
  Administered 2015-02-26 (×2): 80 ug via INTRAVENOUS

## 2015-02-26 MED ORDER — HYDROMORPHONE HCL 1 MG/ML IJ SOLN
INTRAMUSCULAR | Status: AC
  Filled 2015-02-26: qty 1

## 2015-02-26 MED ORDER — LIDOCAINE HCL (CARDIAC) 20 MG/ML IV SOLN
INTRAVENOUS | Status: DC | PRN
  Administered 2015-02-26: 80 mg via INTRAVENOUS

## 2015-02-26 MED ORDER — DEXAMETHASONE SODIUM PHOSPHATE 10 MG/ML IJ SOLN
INTRAMUSCULAR | Status: AC
  Filled 2015-02-26: qty 1

## 2015-02-26 MED ORDER — BACITRACIN ZINC 500 UNIT/GM EX OINT
TOPICAL_OINTMENT | CUTANEOUS | Status: AC
  Filled 2015-02-26: qty 28.35

## 2015-02-26 MED ORDER — ONDANSETRON HCL 4 MG/2ML IJ SOLN: 4.0000 mg | Freq: Four times a day (QID) | INTRAMUSCULAR | Status: DC | PRN

## 2015-02-26 MED ORDER — PILOCARPINE HCL 5 MG PO TABS
5.0000 mg | ORAL_TABLET | Freq: Every day | ORAL | Status: DC
  Filled 2015-02-26: qty 1

## 2015-02-26 MED ORDER — BUPIVACAINE HCL (PF) 0.25 % IJ SOLN
INTRAMUSCULAR | Status: DC | PRN
  Administered 2015-02-26: 30 mL

## 2015-02-26 MED ORDER — METOCLOPRAMIDE HCL 5 MG/ML IJ SOLN: 5.0000 mg | Freq: Three times a day (TID) | INTRAMUSCULAR | Status: DC | PRN

## 2015-02-26 MED ORDER — HYDROMORPHONE HCL 1 MG/ML IJ SOLN
0.2500 mg | INTRAMUSCULAR | Status: DC | PRN
  Administered 2015-02-26: 0.5 mg via INTRAVENOUS

## 2015-02-26 MED ORDER — FENTANYL CITRATE (PF) 100 MCG/2ML IJ SOLN
INTRAMUSCULAR | Status: DC | PRN
  Administered 2015-02-26 (×10): 25 ug via INTRAVENOUS

## 2015-02-26 MED ORDER — PROMETHAZINE HCL 25 MG/ML IJ SOLN: 6.2500 mg | INTRAMUSCULAR | Status: DC | PRN

## 2015-02-26 MED ORDER — ARTIFICIAL TEARS OP OINT
TOPICAL_OINTMENT | OPHTHALMIC | Status: AC
  Filled 2015-02-26: qty 3.5

## 2015-02-26 MED ORDER — FENTANYL CITRATE (PF) 250 MCG/5ML IJ SOLN
INTRAMUSCULAR | Status: AC
Start: 2015-02-26 — End: 2015-02-26
  Filled 2015-02-26: qty 5

## 2015-02-26 MED ORDER — CEFAZOLIN SODIUM-DEXTROSE 2-3 GM-% IV SOLR
INTRAVENOUS | Status: DC | PRN
  Administered 2015-02-26: 2 g via INTRAVENOUS

## 2015-02-26 MED ORDER — LACTATED RINGERS IV SOLN
INTRAVENOUS | Status: DC | PRN
  Administered 2015-02-26 (×2): via INTRAVENOUS

## 2015-02-26 MED ORDER — AMLODIPINE BESYLATE 10 MG PO TABS
10.0000 mg | ORAL_TABLET | Freq: Every day | ORAL | Status: DC
  Administered 2015-02-26 – 2015-02-27 (×2): 10 mg via ORAL
  Filled 2015-02-26 (×2): qty 1

## 2015-02-26 MED ORDER — PROPOFOL 10 MG/ML IV BOLUS
INTRAVENOUS | Status: AC
  Filled 2015-02-26: qty 20

## 2015-02-26 MED ORDER — METHOCARBAMOL 1000 MG/10ML IJ SOLN
500.0000 mg | Freq: Four times a day (QID) | INTRAVENOUS | Status: DC | PRN
  Filled 2015-02-26: qty 5

## 2015-02-26 MED ORDER — MUPIROCIN 2 % EX OINT
TOPICAL_OINTMENT | Freq: Two times a day (BID) | CUTANEOUS | Status: DC
  Administered 2015-02-26 – 2015-02-27 (×3): via TOPICAL
  Filled 2015-02-26: qty 22

## 2015-02-26 MED ORDER — MIDAZOLAM HCL 2 MG/2ML IJ SOLN
INTRAMUSCULAR | Status: AC
  Filled 2015-02-26: qty 2

## 2015-02-26 MED ORDER — PILOCARPINE HCL 5 MG PO TABS: 2.5000 mg | ORAL_TABLET | Freq: Two times a day (BID) | ORAL | Status: DC

## 2015-02-26 MED ORDER — PHENYLEPHRINE 40 MCG/ML (10ML) SYRINGE FOR IV PUSH (FOR BLOOD PRESSURE SUPPORT)
PREFILLED_SYRINGE | INTRAVENOUS | Status: AC
  Filled 2015-02-26: qty 10

## 2015-02-26 MED ORDER — METHOCARBAMOL 500 MG PO TABS
500.0000 mg | ORAL_TABLET | Freq: Four times a day (QID) | ORAL | Status: DC | PRN
  Administered 2015-02-26 (×2): 500 mg via ORAL
  Filled 2015-02-26 (×2): qty 1

## 2015-02-26 MED ORDER — CEFAZOLIN SODIUM-DEXTROSE 2-3 GM-% IV SOLR
INTRAVENOUS | Status: AC
  Filled 2015-02-26: qty 50

## 2015-02-26 MED ORDER — MORPHINE SULFATE (PF) 2 MG/ML IV SOLN: 1.0000 mg | INTRAVENOUS | Status: DC | PRN

## 2015-02-26 MED ORDER — ONDANSETRON HCL 4 MG/2ML IJ SOLN
INTRAMUSCULAR | Status: AC
  Filled 2015-02-26: qty 2

## 2015-02-26 SURGICAL SUPPLY — 75 items
BANDAGE ELASTIC 4 VELCRO ST LF (GAUZE/BANDAGES/DRESSINGS) ×2 IMPLANT
BANDAGE ELASTIC 6 VELCRO ST LF (GAUZE/BANDAGES/DRESSINGS) ×2 IMPLANT
BANDAGE ESMARK 6X9 LF (GAUZE/BANDAGES/DRESSINGS) IMPLANT
BIT DRILL CANN 2.7 (BIT) ×2
BIT DRILL CANN 2.7MM (BIT) ×1
BIT DRILL SRG 2.7XCANN AO CPLG (BIT) IMPLANT
BIT DRL SRG 2.7XCANN AO CPLNG (BIT) ×1
BLADE SURG 15 STRL LF DISP TIS (BLADE) ×1 IMPLANT
BLADE SURG 15 STRL SS (BLADE) ×3
BNDG CMPR 9X6 STRL LF SNTH (GAUZE/BANDAGES/DRESSINGS) ×1
BNDG COHESIVE 4X5 TAN STRL (GAUZE/BANDAGES/DRESSINGS) ×3 IMPLANT
BNDG COHESIVE 6X5 TAN STRL LF (GAUZE/BANDAGES/DRESSINGS) ×2 IMPLANT
BNDG ESMARK 6X9 LF (GAUZE/BANDAGES/DRESSINGS) ×3
BNDG GAUZE ELAST 4 BULKY (GAUZE/BANDAGES/DRESSINGS) ×2 IMPLANT
CANISTER SUCT 3000ML PPV (MISCELLANEOUS) ×3 IMPLANT
COVER SURGICAL LIGHT HANDLE (MISCELLANEOUS) ×3 IMPLANT
CUFF TOURNIQUET SINGLE 34IN LL (TOURNIQUET CUFF) ×2 IMPLANT
CUFF TOURNIQUET SINGLE 44IN (TOURNIQUET CUFF) IMPLANT
DRAPE C-ARM 42X72 X-RAY (DRAPES) ×3 IMPLANT
DRAPE C-ARMOR (DRAPES) ×3 IMPLANT
DRAPE IMP U-DRAPE 54X76 (DRAPES) ×3 IMPLANT
DRAPE INCISE IOBAN 66X45 STRL (DRAPES) IMPLANT
DRAPE U-SHAPE 47X51 STRL (DRAPES) ×3 IMPLANT
DRILL 2.6X122MM WL AO SHAFT (BIT) ×2 IMPLANT
DRSG MEPILEX BORDER 4X4 (GAUZE/BANDAGES/DRESSINGS) ×4 IMPLANT
DURAPREP 26ML APPLICATOR (WOUND CARE) ×3 IMPLANT
ELECT CAUTERY BLADE 6.4 (BLADE) ×3 IMPLANT
ELECT REM PT RETURN 9FT ADLT (ELECTROSURGICAL) ×3
ELECTRODE REM PT RTRN 9FT ADLT (ELECTROSURGICAL) ×1 IMPLANT
FACESHIELD WRAPAROUND (MASK) IMPLANT
FACESHIELD WRAPAROUND OR TEAM (MASK) ×1 IMPLANT
GAUZE SPONGE 4X4 12PLY STRL (GAUZE/BANDAGES/DRESSINGS) ×3 IMPLANT
GAUZE XEROFORM 5X9 LF (GAUZE/BANDAGES/DRESSINGS) ×3 IMPLANT
GLOVE NEODERM STRL 7.5 LF PF (GLOVE) ×1 IMPLANT
GLOVE SURG NEODERM 7.5  LF PF (GLOVE) ×2
GLOVE SURG SYN 7.5  E (GLOVE) ×4
GLOVE SURG SYN 7.5 E (GLOVE) ×2 IMPLANT
GLOVE SURG SYN 7.5 PF PI (GLOVE) ×2 IMPLANT
GOWN STRL REIN XL XLG (GOWN DISPOSABLE) ×3 IMPLANT
K-WIRE ORTHOPEDIC 1.4X150L (WIRE) ×6
K-WIRE STRYKER (Wire) ×3 IMPLANT
KIT BASIN OR (CUSTOM PROCEDURE TRAY) ×3 IMPLANT
KIT ROOM TURNOVER OR (KITS) ×3 IMPLANT
KWIRE ORTHOPEDIC 1.4X150L (WIRE) IMPLANT
KWIRE STRYKER (Wire) IMPLANT
NDL HYPO 25GX1X1/2 BEV (NEEDLE) IMPLANT
NEEDLE HYPO 25GX1X1/2 BEV (NEEDLE) ×3 IMPLANT
NS IRRIG 1000ML POUR BTL (IV SOLUTION) ×3 IMPLANT
PACK ORTHO EXTREMITY (CUSTOM PROCEDURE TRAY) ×3 IMPLANT
PAD ARMBOARD 7.5X6 YLW CONV (MISCELLANEOUS) ×6 IMPLANT
PAD CAST 3X4 CTTN HI CHSV (CAST SUPPLIES) ×2 IMPLANT
PADDING CAST ABS 4INX4YD NS (CAST SUPPLIES) ×2
PADDING CAST ABS 6INX4YD NS (CAST SUPPLIES) ×2
PADDING CAST ABS COTTON 4X4 ST (CAST SUPPLIES) IMPLANT
PADDING CAST ABS COTTON 6X4 NS (CAST SUPPLIES) IMPLANT
PADDING CAST COTTON 3X4 STRL (CAST SUPPLIES)
PLATE FIBULA 4H (Plate) ×2 IMPLANT
SCREW BONE 14MMX3.5MM (Screw) ×2 IMPLANT
SCREW BONE 18 (Screw) ×2 IMPLANT
SCREW BONE 3.5X16MM (Screw) ×2 IMPLANT
SCREW BONE NON-LCKING 3.5X12MM (Screw) ×4 IMPLANT
SCREW CANNULATED 4.0X36MM FT (Screw) ×2 IMPLANT
SCREW LOCKING 3.5X16MM (Screw) ×4 IMPLANT
SPONGE LAP 18X18 X RAY DECT (DISPOSABLE) IMPLANT
SUCTION FRAZIER TIP 10 FR DISP (SUCTIONS) ×3 IMPLANT
SUT ETHILON 3 0 PS 1 (SUTURE) ×4 IMPLANT
SUT VIC AB 2-0 CT1 27 (SUTURE) ×6
SUT VIC AB 2-0 CT1 TAPERPNT 27 (SUTURE) IMPLANT
SYR CONTROL 10ML LL (SYRINGE) ×2 IMPLANT
TOWEL OR 17X24 6PK STRL BLUE (TOWEL DISPOSABLE) ×3 IMPLANT
TOWEL OR 17X26 10 PK STRL BLUE (TOWEL DISPOSABLE) ×6 IMPLANT
TUBE CONNECTING 12'X1/4 (SUCTIONS) ×1
TUBE CONNECTING 12X1/4 (SUCTIONS) ×2 IMPLANT
WASHER ASNIS III 4.0 SCR (Washer) ×2 IMPLANT
WATER STERILE IRR 1000ML POUR (IV SOLUTION) ×3 IMPLANT

## 2015-02-26 NOTE — H&P (Signed)

## 2015-02-26 NOTE — Anesthesia Postprocedure Evaluation (Signed)
Anesthesia Post Note  Patient: Alexandria Leach  Procedure(s) Performed: Procedure(s) (LRB): OPEN REDUCTION INTERNAL FIXATION (ORIF) LEFT TRIMALLEOLAR ANKLE FRACTURE (Left)  Patient location during evaluation: PACU Anesthesia Type: General Level of consciousness: awake and alert Pain management: pain level controlled Vital Signs Assessment: post-procedure vital signs reviewed and stable Respiratory status: spontaneous breathing, nonlabored ventilation, respiratory function stable and patient connected to nasal cannula oxygen Cardiovascular status: blood pressure returned to baseline and stable Postop Assessment: no signs of nausea or vomiting Anesthetic complications: no    Last Vitals:  Filed Vitals:   02/26/15 0411 02/26/15 0934  BP: 138/68   Pulse: 81 93  Temp: 37.5 C 36.6 C  Resp: 16     Last Pain:  Filed Vitals:   02/26/15 0944  PainSc: 2     LLE Motor Response: Responds to commands (wiggles toes ) (02/26/15 0934) LLE Sensation: No numbness;No tingling (02/26/15 0934)          Zenaida Deed

## 2015-02-26 NOTE — Progress Notes (Signed)
Patient ID: Alexandria Leach, female   DOB: November 11, 1950, 64 y.o.   MRN: [id number]  [facility] PROGRESS NOTE  Alexandria Leach [id number] DOB: 07-08-1950 DOA: 02/28/2015 PCP: Ria Bush, MD   Brief narrative:    64 y.o. female with multiple medical problems. She fell after stepping onto her cane today. Now with left ankle fracture. Orthopedics reduced fracture in ED but recommended surgery.   Assessment/Plan:    Principal Problem:   Fracture of the left ankle, trimalleolar, after mechanical fall  - s/p open treatment of left ankle fracture with internal fixation, trimalleolar w/o fixation of posterior malleolus - plan for PT evaluation - provide analgesia as needed   Active Problems:   Essential hypertension - reasonable inpatient control     Acute kidney injury - provide IVF and repeat BMP in AM    Refusal of blood transfusions as patient is Jehovah's Witness - no indication for transfusion at this time     Sjogren's syndrome ([place]) - continue Plaquenil     History of pulmonary embolism - currently on Lovenox SQ for DVT prophylaxis     Chronic diastolic CHF, grade II - last 2 D ECHO in June 2016 - no signs of volume overload  - weight 69 kg this AM  DVT prophylaxis - Lovenox SQ  Code Status: Full.  Family Communication:  plan of care discussed with the patient Disposition Plan: awaiting for PT evaluation to determine appropriate discharge plan   IV access:  Peripheral IV  Procedures and diagnostic studies:    Dg Tibia/fibula Left 2015/02/28 Left ankle three views: Trimalleolar comminuted fracture or dislocation. The talar dome is displaced laterally and posteriorly. Comminuted medial malleolar fracture fragment slightly displaced. Posterior distal tibial fracture fragments slightly displaced. Oblique fracture of the distal left fibula with overlapping of displaced fracture fragments. Left tibia -fibula: Besides the above described fracture, no proximal  left tibia -fibular fracture noted  Ct Ankle Left Wo Contrast Feb 28, 2015  Comminuted trimalleolar fracture of the left ankle as described above. 12 mm of lateral subluxation of the talar dome relative to the tibial plafond.   Dg Knee Complete 4 Views Left 02-28-2015 Negative.   Dg Ankle Left Port 28-Feb-2015  gain noted displaced fracture of distal tibia and fibula. There is mild improvement in alignment. Persistent disruption of ankle mortise and anterior displacement of distal tibia with slight improvement.   Dg Ankle Left Port 02-28-2015  Improved alignment of the fracture dislocation of the LEFT ankle. 2. Significant subluxation remains as above. 3. Fine detail obscured by splint material.  Dg Foot Complete Left 02/28/2015 Fracture dislocation of the ankle.  No foot fracture.  Medical Consultants:  Ortho   Other Consultants:  PT  IAnti-Infectives:   None   Faye Ramsay, MD  [facility] Pager [phone]  If 7PM-7AM, please contact night-coverage [email or web address] Password [facility] 02/26/2015, 7:43 AM   LOS: 3 days   HPI/Subjective: No events overnight.   Objective: Filed Vitals:   02/25/15 0545 02/25/15 1357 02/25/15 2015 02/26/15 0411  BP: 115/58 131/65 116/55 138/68  Pulse: 79 84 86 81  Temp: 100.2 F (37.9 C) 100 F (37.8 C) 99.2 F (37.3 C) 99.5 F (37.5 C)  TempSrc: Oral Oral Oral Oral  Resp: 16 16 16 16   SpO2: 100% 100% 99% 99%    Intake/Output Summary (Last 24 hours) at 02/26/15 0743 Last data filed at 02/25/15 2016  Gross per 24 hour  Intake    960 ml  Output  0 ml  Net    960 ml    Exam:   General:  Pt is alert, follows commands appropriately, not in acute distress  Cardiovascular: Regular rate and rhythm, no rubs, no gallops  Respiratory: Clear to auscultation bilaterally, no wheezing, no crackles, no rhonchi  Abdomen: Soft, non tender, non distended, bowel sounds present, no guarding  Extremities: Left ankle in wrap   Data Reviewed: Basic  Metabolic Panel:  Recent Labs Lab 02/23/15 1559 02/25/15 0307 02/26/15 0430  NA 143 141 140  K 4.2 3.6 3.6  CL 109 108 108  CO2  --  25 21*  GLUCOSE 136* 92 114*  BUN 11 9 8   CREATININE 0.90 0.99 1.26*  CALCIUM  --  8.8* 9.1   CBC:  Recent Labs Lab 02/23/15 1546 02/23/15 1559 02/25/15 0307 02/26/15 0430  WBC 9.5  --  8.6 7.4  HGB 10.7* 12.9 10.6* 11.5*  HCT 32.7* 38.0 32.4* 34.7*  MCV 82.6  --  82.7 82.8  PLT 175  --  167 185    Scheduled Meds: . [MAR Hold] cycloSPORINE  1 drop Both Eyes BID  . [MAR Hold] docusate sodium  100 mg Oral BID  . [MAR Hold] escitalopram  20 mg Oral Daily  . [MAR Hold] ezetimibe  10 mg Oral Daily  . [MAR Hold] famotidine  20 mg Oral BID  . [MAR Hold] gabapentin  300 mg Oral TID  . [MAR Hold]  HYDROmorphone (DILAUDID) injection  1 mg Intravenous Once  . [MAR Hold] hydroxychloroquine  200 mg Oral BID  . [MAR Hold] metoprolol succinate  25 mg Oral BID  . [MAR Hold] pilocarpine  2.5 mg Oral Q24H  . [MAR Hold] pilocarpine  5 mg Oral Q0600   Continuous Infusions:

## 2015-02-26 NOTE — Transfer of Care (Signed)
Immediate Anesthesia Transfer of Care Note  Patient: Alexandria Leach  Procedure(s) Performed: Procedure(s): OPEN REDUCTION INTERNAL FIXATION (ORIF) LEFT TRIMALLEOLAR ANKLE FRACTURE (Left)  Patient Location: PACU  Anesthesia Type:General  Level of Consciousness: awake, alert , oriented, patient cooperative and responds to stimulation  Airway & Oxygen Therapy: Patient Spontanous Breathing and Patient connected to nasal cannula oxygen  Post-op Assessment: Report given to RN, Post -op Vital signs reviewed and stable and Patient moving all extremities X 4  Post vital signs: Reviewed and stable  Last Vitals:  Filed Vitals:   02/25/15 2015 02/26/15 0411  BP: 116/55 138/68  Pulse: 86 81  Temp: 37.3 C 37.5 C  Resp: 16 16    Complications: No apparent anesthesia complications

## 2015-02-26 NOTE — Anesthesia Procedure Notes (Signed)
Procedure Name: LMA Insertion Performed by: Judeth Cornfield T Pre-anesthesia Checklist: Patient identified, Timeout performed, Emergency Drugs available, Suction available and Patient being monitored Patient Re-evaluated:Patient Re-evaluated prior to inductionOxygen Delivery Method: Circle system utilized Preoxygenation: Pre-oxygenation with 100% oxygen Intubation Type: IV induction Ventilation: Mask ventilation without difficulty LMA: LMA inserted LMA Size: 4.0 Number of attempts: 1 Placement Confirmation: breath sounds checked- equal and bilateral and positive ETCO2 Tube secured with: Tape Dental Injury: Teeth and Oropharynx as per pre-operative assessment

## 2015-02-26 NOTE — Op Note (Signed)
Date of Surgery: 02/26/2015  INDICATIONS: [name] is a 64 y.o.-year-old female who sustained a left ankle fracture; she was indicated for open reduction and internal fixation due to the displaced nature of the articular fracture and came to the operating room today for this procedure. The patient did consent to the procedure after discussion of the risks and benefits.  PREOPERATIVE DIAGNOSIS: left trimalleolar ankle fracture  POSTOPERATIVE DIAGNOSIS: Same.  PROCEDURE: Open treatment of left ankle fracture with internal fixation. Trimalleolar w/o fixation of posterior malleolus CPT 27822.   SURGEON: N. Eduard Roux, M.D.  ASSIST: none.  ANESTHESIA:  general  TOURNIQUET TIME: less than 90 minutes  IV FLUIDS AND URINE: See anesthesia.  ESTIMATED BLOOD LOSS: minimal mL.  IMPLANTS: [employer] Variax 4 hole distal fibula plate  COMPLICATIONS: None.  DESCRIPTION OF PROCEDURE: The patient was brought to the operating room and placed supine on the operating table.  The patient had been signed prior to the procedure and this was documented. The patient had the anesthesia placed by the anesthesiologist.  A nonsterile tourniquet was placed on the upper thigh.  The prep verification and incision time-outs were performed to confirm that this was the correct patient, site, side and location. The patient had an SCD on the opposite lower extremity. The patient did receive antibiotics prior to the incision and was re-dosed during the procedure as needed at indicated intervals.  The patient had the lower extremity prepped and draped in the standard surgical fashion.  The extremity was exsanguinated using an esmarch bandage and the tourniquet was inflated to 300 mm Hg.  A lateral incision over the distal fibula was used. Full-thickness flaps were created and elevated off of the fibula. The fracture was exposed. Organized hematoma and entrapped periosteum were removed from the fracture site. Fracture was  reduced and held with a K wire area a 4 hole distal fibular plate was placed at the appropriate position. Locking and nonlocking screws were placed through the plate both proximally and distally to the fracture. Their placement was confirmed on fluoroscopy. We then turned our attention to the medial side. A longitudinal incision over the distal fibula was created. Full-thickness flaps were created. The saphenous neurovascular structures were identified and protected. Entrapped periosteum was removed from the fracture. There was a significant amount of comminution and there was only one piece of the medial malleolus that could accept a screw. The fracture was reduced and held with a tenaculum clamp. A single K wire was advanced up the medial malleolus into the distal tibia. A 36 mm fully threaded cannulated screw was advanced over the K wire with the fracture reduced. The clamp was then removed and the fracture maintained reduction. Fluoroscopy was used to confirm its proper placement. Final x-rays were taken. The wounds were thoroughly irrigated and closed in layer fashion using 0 Vicryl, 2-0 Vicryl, 3-0 nylon. Sterile dressings were applied. Local anesthesia placed in the incisions. Foot was immobilized in a short-leg splint. Patient tolerated the procedure well was extubated and transferred to the PACU in stable condition.  POSTOPERATIVE PLAN: [name] will remain nonweightbearing on this leg for approximately 6 weeks; [name] will return for suture removal in 2 weeks.  He will be immobilized in a short leg splint and then transitioned to a CAM walker at his first follow up appointment.  [name] will receive DVT prophylaxis based on other medications, activity level, and risk ratio of bleeding to thrombosis.  Azucena Cecil, MD [facility]  [phone] 9:31 AM

## 2015-02-26 NOTE — Anesthesia Preprocedure Evaluation (Addendum)
Anesthesia Evaluation  Patient identified by MRN, date of birth, ID band Patient awake    Reviewed: Allergy & Precautions, H&P , NPO status , Patient's Chart, lab work & pertinent test results  History of Anesthesia Complications (+) PONV and history of anesthetic complications  Airway Mallampati: I  TM Distance: >3 FB Neck ROM: full    Dental   Pulmonary    Pulmonary exam normal breath sounds clear to auscultation       Cardiovascular hypertension, + Valvular Problems/Murmurs  Rhythm:regular Rate:Normal     Neuro/Psych  Neuromuscular disease    GI/Hepatic   Endo/Other    Renal/GU Renal disease     Musculoskeletal  (+) Arthritis , Fibromyalgia -  Abdominal   Peds  Hematology  (+) anemia , JEHOVAH'S WITNESS  Anesthesia Other Findings   Reproductive/Obstetrics                           Anesthesia Physical  Anesthesia Plan  ASA: III  Anesthesia Plan: General   Post-op Pain Management:    Induction: Intravenous  Airway Management Planned: LMA  Additional Equipment:   Intra-op Plan:   Post-operative Plan: Extubation in OR  Informed Consent: I have reviewed the patients History and Physical, chart, labs and discussed the procedure including the risks, benefits and alternatives for the proposed anesthesia with the patient or authorized representative who has indicated his/her understanding and acceptance.     Plan Discussed with: CRNA, Anesthesiologist and Surgeon  Anesthesia Plan Comments:         Anesthesia Quick Evaluation

## 2015-02-27 ENCOUNTER — Encounter (HOSPITAL_COMMUNITY): Payer: Self-pay | Admitting: Orthopaedic Surgery

## 2015-02-27 DIAGNOSIS — S82892A Other fracture of left lower leg, initial encounter for closed fracture: Secondary | ICD-10-CM

## 2015-02-27 DIAGNOSIS — E7800 Pure hypercholesterolemia, unspecified: Secondary | ICD-10-CM | POA: Diagnosis not present

## 2015-02-27 DIAGNOSIS — W19XXXA Unspecified fall, initial encounter: Secondary | ICD-10-CM | POA: Diagnosis not present

## 2015-02-27 LAB — CBC
HCT: 30.4 % — ABNORMAL LOW (ref 36.0–46.0)
HEMOGLOBIN: 9.7 g/dL — AB (ref 12.0–15.0)
MCH: 26.6 pg (ref 26.0–34.0)
MCHC: 31.9 g/dL (ref 30.0–36.0)
MCV: 83.5 fL (ref 78.0–100.0)
Platelets: 179 10*3/uL (ref 150–400)
RBC: 3.64 MIL/uL — ABNORMAL LOW (ref 3.87–5.11)
RDW: 14.3 % (ref 11.5–15.5)
WBC: 9.5 10*3/uL (ref 4.0–10.5)

## 2015-02-27 LAB — BASIC METABOLIC PANEL
ANION GAP: 7 (ref 5–15)
BUN: 6 mg/dL (ref 6–20)
CALCIUM: 9 mg/dL (ref 8.9–10.3)
CO2: 25 mmol/L (ref 22–32)
Chloride: 108 mmol/L (ref 101–111)
Creatinine, Ser: 0.96 mg/dL (ref 0.44–1.00)
GFR calc Af Amer: >60 mL/min (ref >60)
GFR calc non Af Amer: >60 mL/min (ref >60)
GLUCOSE: 132 mg/dL — AB (ref 65–99)
Potassium: 4.3 mmol/L (ref 3.5–5.1)
Sodium: 140 mmol/L (ref 135–145)

## 2015-02-27 NOTE — Progress Notes (Signed)
   Subjective:  Patient reports pain as mild.  Just finished OT, did really well.  Objective:   VITALS:   Filed Vitals:   02/26/15 1057 02/26/15 1508 02/26/15 2013 02/27/15 0417  BP: 130/67 128/67 130/61 137/69  Pulse: 82 87 89 87  Temp: 98.2 F (36.8 C) 99.1 F (37.3 C) 98.5 F (36.9 C) 98.4 F (36.9 C)  TempSrc: Oral Oral Oral Oral  Resp: 16 17 18 18   SpO2: 100% 100% 97% 98%    Neurologically intact Neurovascular intact Sensation intact distally Intact pulses distally Dorsiflexion/Plantar flexion intact Incision: dressing C/D/I and no drainage No cellulitis present Compartment soft   Lab Results  Component Value Date   WBC 9.5 02/27/2015   HGB 9.7* 02/27/2015   HCT 30.4* 02/27/2015   MCV 83.5 02/27/2015   PLT 179 02/27/2015     Assessment/Plan:  1 Day Post-Op   - Expected postop acute blood loss anemia - will monitor for symptoms - Up with PT/OT - likely home with HHPT - DVT ppx - SCDs, ambulation, lovenox - NWB operative extremity - Pain controlled - Discharge planning per primary team - stable from ortho standpoint for dc home  Marianna Payment 02/27/2015, 7:43 AM [phone]

## 2015-02-27 NOTE — Evaluation (Addendum)
Occupational Therapy Evaluation Patient Details Name: Alexandria Leach MRN: [id number] DOB: 26-Jun-1950 Today's Date: 02/27/2015    History of Present Illness Alexandria Leach is a 64 y.o. female with multiple medical problems. She fell after stepping onto her cane today. Now with left ankle fracture. She is s/p OPEN REDUCTION INTERNAL FIXATION (ORIF) LEFT TRIMALLEOLAR ANKLE FRACTURE (Left)   Clinical Impression   Patient presenting with deconditioning, decreased balance, decreased functional mobility/transfers, and decreased I in self care.  Patient reports being Mod I  PTA. Patient currently functioning at supervision - min A. Patient will benefit from acute OT to increase overall independence in the areas of ADLs, functional mobility, and safety in order to safely discharge home.    Follow Up Recommendations  No OT follow up;Supervision - Intermittent  ,    Equipment Recommendations  Other (comment) (RW)    Recommendations for Other Services Other (comment) (none)     Precautions / Restrictions Precautions Precautions: Fall Restrictions Weight Bearing Restrictions: Yes LLE Weight Bearing: Non weight bearing      Mobility Bed Mobility Overal bed mobility: Modified Independent                Transfers Overall transfer level: Needs assistance Equipment used: Rolling walker (2 wheeled) Transfers: Sit to/from [facility] Sit to Stand: Supervision Stand pivot transfers: Supervision       General transfer comment: min cues for technique    Balance Overall balance assessment: Needs assistance Sitting-balance support: Feet supported Sitting balance-Leahy Scale: Good     Standing balance support: During functional activity Standing balance-Leahy Scale: Poor Standing balance comment: use of RW for support                            ADL Overall ADL's : Needs assistance/impaired           General ADL Comments: Pt able to  demonstrate ability to donn/doff R sock and thread clothing onto L LE. Pt needs set up A for UB self care and min A for dynamic standing balance with self care tasks with use of RW. Pt very motivated to return home.  Pt ambulating with RW and close supervision to bathroom for toilet transfer. Pt utilized grab bars, she has at home as well, with supervision for transfer onto elevated toilet seat.                Pertinent Vitals/Pain Pain Assessment: No/denies pain     Hand Dominance Right   Extremity/Trunk Assessment Upper Extremity Assessment Upper Extremity Assessment: Generalized weakness   Lower Extremity Assessment Lower Extremity Assessment: Defer to PT evaluation       Communication Communication Communication: No difficulties   Cognition Arousal/Alertness: Awake/alert Behavior During Therapy: WFL for tasks assessed/performed Overall Cognitive Status: Within Functional Limits for tasks assessed                   Home Living Family/patient expects to be discharged to:: Private residence Living Arrangements: Spouse/significant other Available Help at Discharge: Family;Friend(s) (Friend is coming to stay with her 24/7 at discharge) Type of Home: House Home Access: Stairs to enter [employer] of Steps: 3 Entrance Stairs-Rails: None Home Layout: One level     Bathroom Shower/Tub: Tub/shower unit;Curtain Shower/tub characteristics: Architectural technologist: Standard Bathroom Accessibility: Yes   Home Equipment: Environmental consultant - 4 wheels;Cane - single point;Bedside commode;Shower seat          Prior Functioning/Environment  Level of Independence: Independent with assistive device(s)             OT Diagnosis: Generalized weakness   OT Problem List: Decreased strength;Decreased knowledge of use of DME or AE;Decreased activity tolerance;Decreased safety awareness;Impaired balance (sitting and/or standing)   OT Treatment/Interventions: Self-care/ADL  training;Therapeutic exercise;Energy conservation;DME and/or AE instruction;Patient/family education;Therapeutic activities;Balance training    OT Goals(Current goals can be found in the care plan section) Acute Rehab OT Goals Patient Stated Goal: to go home OT Goal Formulation: With patient Time For Goal Achievement: 03/13/15 Potential to Achieve Goals: Fair  OT Frequency: Min 2X/week   Barriers to D/C:    none known at this time          End of Session Equipment Utilized During Treatment: Rolling walker Nurse Communication: Mobility status  Activity Tolerance: Patient tolerated treatment well Patient left: in bed;with SCD's reapplied;with call bell/phone within reach   Time: 0724-0744 OT Time Calculation (min): 20 min Charges:  OT General Charges $OT Visit: 1 Procedure OT Evaluation $Initial OT Evaluation Tier I: 1 Procedure G codes: Functional limitation :Self care Self care current status [id number]) : CJ Self care goal status [id number]) : CI Pittman, Sabeen Piechocki L, MS, OTR/L 02/27/2015, 8:25 AM

## 2015-02-27 NOTE — Discharge Instructions (Signed)
1. Keep splint clean and dry 2. Elevate foot above level of the heart 3. Take lovenox to prevent blood clots 4. Take pain meds as needed 5. Strict non weight bearing to operative extremity   Ankle Fracture A fracture is a break in a bone. The ankle joint is made up of three bones. These include the lower (distal)sections of your lower leg bones, called the tibia and fibula, along with a bone in your foot, called the talus. Depending on how bad the break is and if more than one ankle joint bone is broken, a cast or splint is used to protect and keep your injured bone from moving while it heals. Sometimes, surgery is required to help the fracture heal properly.  There are two general types of fractures:  Stable fracture. This includes a single fracture line through one bone, with no injury to ankle ligaments. A fracture of the talus that does not have any displacement (movement of the bone on either side of the fracture line) is also stable.  Unstable fracture. This includes more than one fracture line through one or more bones in the ankle joint. It also includes fractures that have displacement of the bone on either side of the fracture line. CAUSES  A direct blow to the ankle.   Quickly and severely twisting your ankle.  Trauma, such as a car accident or falling from a significant height. RISK FACTORS You may be at a higher risk of ankle fracture if:  You have certain medical conditions.  You are involved in high-impact sports.  You are involved in a high-impact car accident. SIGNS AND SYMPTOMS   Tender and swollen ankle.  Bruising around the injured ankle.  Pain on movement of the ankle.  Difficulty walking or putting weight on the ankle.  A cold foot below the site of the ankle injury. This can occur if the blood vessels passing through your injured ankle were also damaged.  Numbness in the foot below the site of the ankle injury. DIAGNOSIS  An ankle fracture is  usually diagnosed with a physical exam and X-rays. A CT scan may also be required for complex fractures. TREATMENT  Stable fractures are treated with a cast or splint and using crutches to avoid putting weight on your injured ankle. This is followed by an ankle strengthening program. Some patients require a special type of cast, depending on other medical problems they may have. Unstable fractures require surgery to ensure the bones heal properly. Your health care provider will tell you what type of fracture you have and the best treatment for your condition. HOME CARE INSTRUCTIONS   Review correct crutch use with your health care provider and use your crutches as directed. Safe use of crutches is extremely important. Misuse of crutches can cause you to fall or cause injury to nerves in your hands or armpits.  Do not put weight or pressure on the injured ankle until directed by your health care provider.  To lessen the swelling, keep the injured leg elevated while sitting or lying down.  Apply ice to the injured area:  Put ice in a plastic bag.  Place a towel between your cast and the bag.  Leave the ice on for 20 minutes, 2-3 times a day.  If you have a plaster or fiberglass cast:  Do not try to scratch the skin under the cast with any objects. This can increase your risk of skin infection.  Check the skin around the cast every  day. You may put lotion on any red or sore areas.  Keep your cast dry and clean.  If you have a plaster splint:  Wear the splint as directed.  You may loosen the elastic around the splint if your toes become numb, tingle, or turn cold or blue.  Do not put pressure on any part of your cast or splint; it may break. Rest your cast only on a pillow the first 24 hours until it is fully hardened.  Your cast or splint can be protected during bathing with a plastic bag sealed to your skin with medical tape. Do not lower the cast or splint into water.  Take  medicines as directed by your health care provider. Only take over-the-counter or prescription medicines for pain, discomfort, or fever as directed by your health care provider.  Do not drive a vehicle until your health care provider specifically tells you it is safe to do so.  If your health care provider has given you a follow-up appointment, it is very important to keep that appointment. Not keeping the appointment could result in a chronic or permanent injury, pain, and disability. If you have any problem keeping the appointment, call the facility for assistance. SEEK MEDICAL CARE IF: You develop increased swelling or discomfort. SEEK IMMEDIATE MEDICAL CARE IF:   Your cast gets damaged or breaks.  You have continued severe pain.  You develop new pain or swelling after the cast was put on.  Your skin or toenails below the injury turn blue or gray.  Your skin or toenails below the injury feel cold, numb, or have loss of sensitivity to touch.  There is a bad smell or pus draining from under the cast. MAKE SURE YOU:   Understand these instructions.  Will watch your condition.  Will get help right away if you are not doing well or get worse.   This information is not intended to replace advice given to you by your health care provider. Make sure you discuss any questions you have with your health care provider.   Document Released: 03/01/2000 Document Revised: 03/09/2013 Document Reviewed: 10/01/2012 Elsevier Interactive Patient Education [employer].

## 2015-02-27 NOTE — Progress Notes (Signed)
Physical Therapy Treatment Patient Details Name: Alexandria Leach MRN: [id number] DOB: February 28, 1951 Today's Date: 02/27/2015    History of Present Illness Alexandria Leach is a 64 y.o. female with multiple medical problems. She fell after stepping onto her cane today. Now with left ankle fracture. She is s/p OPEN REDUCTION INTERNAL FIXATION (ORIF) LEFT TRIMALLEOLAR ANKLE FRACTURE (Left)    PT Comments    Pt was very pleasant to work with. She continues to improve her capacity to ambulate. She preferred using RW over knee walker. Pt successfully navigated stairs and also reports feeling more comfortable on stairs. Will continue to follow to increase safety functional mobility and LE strength to decrease burden of care.   Follow Up Recommendations  No PT follow up     Equipment Recommendations  3in1 (PT);Rolling walker with 5" wheels    Recommendations for Other Services       Precautions / Restrictions Precautions Precautions: Fall Restrictions Weight Bearing Restrictions: Yes LLE Weight Bearing: Non weight bearing    Mobility  Bed Mobility Overal bed mobility: Modified Independent             General bed mobility comments: Sitting in chair upon PT arrival.  Transfers Overall transfer level: Modified independent Equipment used: Rolling walker (2 wheeled) Transfers: Sit to/from [facility] Sit to Stand: Supervision Stand pivot transfers: Supervision       General transfer comment: min cues for technique  Ambulation/Gait Ambulation/Gait assistance: Supervision Ambulation Distance (Feet): 55 Feet Assistive device: Rolling walker (2 wheeled) Gait Pattern/deviations: Step-to pattern   Gait velocity interpretation: Below normal speed for age/gender General Gait Details: pt able to self correct need to roll RW, increased gait and good use of hopping to maintain NWB status. Pt declined use of knee walker stating she prefers RW   Stairs Stairs:  Yes Stairs assistance: Supervision Stair Management: Backwards;With walker Number of Stairs: 1 General stair comments: x 2 trials with improved safety, control and pt comfort with increased trials  Wheelchair Mobility    Modified Rankin (Stroke Patients Only)       Balance Overall balance assessment: Needs assistance Sitting-balance support: Feet supported Sitting balance-Leahy Scale: Good     Standing balance support: During functional activity Standing balance-Leahy Scale: Poor Standing balance comment: use of RW for support                    Cognition Arousal/Alertness: Awake/alert Behavior During Therapy: WFL for tasks assessed/performed Overall Cognitive Status: Within Functional Limits for tasks assessed                      Exercises General Exercises - Lower Extremity Long Arc Quad: AROM;Seated;Left;15 reps Hip ABduction/ADduction: Left;Seated;15 reps;AROM Hip Flexion/Marching: Left;Seated;AROM;15 reps    General Comments        Pertinent Vitals/Pain Pain Assessment: 0-10 Pain Score: 2  Pain Location: left ankle Pain Descriptors / Indicators: Aching Pain Intervention(s): Limited activity within patient's tolerance;Monitored during session;Repositioned    Home Living Family/patient expects to be discharged to:: Private residence Living Arrangements: Spouse/significant other Available Help at Discharge: Family;Friend(s) (Friend is coming to stay with her 24/7 at discharge) Type of Home: House Home Access: Stairs to enter Entrance Stairs-Rails: None Home Layout: One level Home Equipment: Environmental consultant - 4 wheels;Cane - single point;Bedside commode;Shower seat      Prior Function Level of Independence: Independent with assistive device(s)          PT Goals (current goals can  now be found in the care plan section) Acute Rehab PT Goals Patient Stated Goal: to go home Progress towards PT goals: Goals met and updated - see care plan     Frequency       PT Plan Current plan remains appropriate;Discharge plan needs to be updated    Co-evaluation             End of Session Equipment Utilized During Treatment: Gait belt Activity Tolerance: Patient tolerated treatment well Patient left: in chair;with call bell/phone within reach     Time: 0936-0957 PT Time Calculation (min) (ACUTE ONLY): 21 min  Charges:  $Gait Training: 8-22 mins                    G CodesHaynes Bast March 04, 2015, 10:31 AM Haynes Bast, SPT 04-Mar-2015 10:31 AM

## 2015-02-27 NOTE — Discharge Summary (Signed)
Physician Discharge Summary  Alexandria Leach [id number] DOB: [birth year] DOA: 02/23/2015  PCP: Ria Bush, MD  Admit date: 02/23/2015 Discharge date: 02/27/2015  Recommendations for Outpatient Follow-up:  1. Pt will need to follow up with PCP in 2-3 weeks post discharge 2. Please obtain BMP to evaluate electrolytes and kidney function 3. Please also check CBC to evaluate Hg and Hct levels  Discharge Diagnoses:  Principal Problem:   Fracture of ankle, trimalleolar Active Problems:   HYPERCHOLESTEROLEMIA   Essential hypertension   Refusal of blood transfusions as patient is Jehovah's Witness   Sjogren's syndrome ([place])   History of pulmonary embolism   Trimalleolar fracture of left ankle   Diastolic dysfunction   Ankle fracture   Fall   Left trimalleolar fracture    Discharge Condition: Stable  Diet recommendation: Heart healthy diet discussed in details    Brief narrative:    64 y.o. female with multiple medical problems. She fell after stepping onto her cane today. Now with left ankle fracture. Orthopedics reduced fracture in ED but recommended surgery.   Assessment/Plan:    Principal Problem:  Fracture of the left ankle, trimalleolar, after mechanical fall  - s/p open treatment of left ankle fracture with internal fixation, trimalleolar w/o fixation of posterior malleolus - tolerating PT well, will go home today  - provide analgesia as needed  - continue Lovenox for DVT prophylaxis per ortho team recommendations   Active Problems:  Essential hypertension - reasonable inpatient control    Acute kidney injury - resolved with IVF    Refusal of blood transfusions as patient is Jehovah's Witness - no indication for transfusion at this time    Sjogren's syndrome ([place]) - continue Plaquenil    History of pulmonary embolism - currently on Lovenox SQ for DVT prophylaxis    Chronic diastolic CHF, grade II - last 2 D ECHO in June 2016 - no  signs of volume overload   DVT prophylaxis - Lovenox SQ  Code Status: Full.  Family Communication: plan of care discussed with the patient Disposition Plan: Home with [facility] PT  IV access:  Peripheral IV  Procedures and diagnostic studies:   Dg Tibia/fibula Left 02/23/2015 Left ankle three views: Trimalleolar comminuted fracture or dislocation. The talar dome is displaced laterally and posteriorly. Comminuted medial malleolar fracture fragment slightly displaced. Posterior distal tibial fracture fragments slightly displaced. Oblique fracture of the distal left fibula with overlapping of displaced fracture fragments. Left tibia -fibula: Besides the above described fracture, no proximal left tibia -fibular fracture noted  Ct Ankle Left Wo Contrast 02/23/2015 Comminuted trimalleolar fracture of the left ankle as described above. 12 mm of lateral subluxation of the talar dome relative to the tibial plafond.   Dg Knee Complete 4 Views Left 02/23/2015 Negative.   Dg Ankle Left Port 02/23/2015 gain noted displaced fracture of distal tibia and fibula. There is mild improvement in alignment. Persistent disruption of ankle mortise and anterior displacement of distal tibia with slight improvement.   Dg Ankle Left Port 02/23/2015 Improved alignment of the fracture dislocation of the LEFT ankle. 2. Significant subluxation remains as above. 3. Fine detail obscured by splint material.  Dg Foot Complete Left 02/23/2015 Fracture dislocation of the ankle. No foot fracture.  Medical Consultants:  Ortho   Other Consultants:  PT  IAnti-Infectives:   None        Discharge Exam: Filed Vitals:   02/26/15 2013 02/27/15 0417  BP: 130/61 137/69  Pulse: 89 87  Temp: 98.5 F (  36.9 C) 98.4 F (36.9 C)  Resp: 18 18   Filed Vitals:   02/26/15 1057 02/26/15 1508 02/26/15 2013 02/27/15 0417  BP: 130/67 128/67 130/61 137/69  Pulse: 82 87 89 87  Temp: 98.2 F (36.8 C) 99.1 F (37.3  C) 98.5 F (36.9 C) 98.4 F (36.9 C)  TempSrc: Oral Oral Oral Oral  Resp: 16 17 18 18   SpO2: 100% 100% 97% 98%    General: Pt is alert, follows commands appropriately, not in acute distress Cardiovascular: Regular rate and rhythm, S1/S2 +, no murmurs, no rubs, no gallops Respiratory: Clear to auscultation bilaterally, no wheezing, no crackles, no rhonchi Abdominal: Soft, non tender, non distended, bowel sounds +, no guarding  Discharge Instructions  Discharge Instructions    Diet - low sodium heart healthy    Complete by:  As directed      Increase activity slowly    Complete by:  As directed      Non weight bearing    Complete by:  As directed             Medication List    TAKE these medications        amLODipine 10 MG tablet  Commonly known as:  NORVASC  Take 1 tablet by mouth  daily     antiseptic oral rinse Liqd  15 mLs by Mouth Rinse route as needed (Oral hygiene).     aspirin 325 MG EC tablet  Take 325 mg by mouth every morning.     cholecalciferol 1000 UNITS tablet  Commonly known as:  VITAMIN D  Take 1,000 Units by mouth every morning.     clonazePAM 1 MG tablet  Commonly known as:  KLONOPIN  Take 1 tablet (1 mg total) by mouth 3 (three) times daily as needed for anxiety.     cycloSPORINE 0.05 % ophthalmic emulsion  Commonly known as:  RESTASIS  Place 1 drop into both eyes 2 (two) times daily.     enoxaparin 40 MG/0.4ML injection  Commonly known as:  LOVENOX  Inject 0.4 mLs (40 mg total) into the skin daily.     escitalopram 20 MG tablet  Commonly known as:  LEXAPRO  Take 1 tablet (20 mg total) by mouth daily.     famotidine 20 MG tablet  Commonly known as:  PEPCID  Take 1 tablet (20 mg total) by mouth 2 (two) times daily.     furosemide 20 MG tablet  Commonly known as:  LASIX  TAKE 1 TABLET BY MOUTH  DAILY AS NEEDED FOR EDEMA.     gabapentin 300 MG capsule  Commonly known as:  NEURONTIN  Take 1 tablet three times a day      HYDROcodone-acetaminophen 5-325 MG tablet  Commonly known as:  NORCO/VICODIN  Take 1-2 tablets by mouth every 4 (four) hours as needed for moderate pain.     HYDROcodone-acetaminophen 7.5-325 MG tablet  Commonly known as:  NORCO  Take 1-2 tablets by mouth every 6 (six) hours as needed for moderate pain.     hydroxychloroquine 200 MG tablet  Commonly known as:  PLAQUENIL  Take 1 tablet (200 mg total) by mouth 2 (two) times daily.     metoprolol succinate 50 MG 24 hr tablet  Commonly known as:  TOPROL-XL  Take 1/2 tablet in the morning and 1 tablet at night time     pilocarpine 5 MG tablet  Commonly known as:  SALAGEN  Take 2.5-5 mg by mouth 2 (two)  times daily. Takes 5mg  first thing in the morning, then 2.5mg  around 2-2:30 PM     predniSONE 20 MG tablet  Commonly known as:  DELTASONE  Take two tablets daily for 3 days followed by one tablet daily for 4 days     SYSTANE 0.4-0.3 % Gel ophthalmic gel  Generic drug:  Polyethyl Glycol-Propyl Glycol  Apply 1 drop to eye 2 (two) times daily.     tiZANidine 4 MG tablet  Commonly known as:  ZANAFLEX  Take 1 tablet by mouth  every 8 hours as needed  muscle spasms     traMADol 50 MG tablet  Commonly known as:  ULTRAM  Take 1 tablet (50 mg total) by mouth every 12 (twelve) hours as needed for severe pain.     VOLTAREN 1 % Gel  Generic drug:  diclofenac sodium  Apply 2 g topically as needed (FOR PAIN).     ZETIA 10 MG tablet  Generic drug:  ezetimibe  Take 10 mg by mouth daily.     zolpidem 12.5 MG CR tablet  Commonly known as:  AMBIEN CR  Take 1 tablet (12.5 mg total) by mouth at bedtime as needed for sleep.           Follow-up Information    Follow up with Ria Bush, MD.   Specialty:  [facility]   Contact information:   [place] [address] [phone]       Follow up with [facility]   Why:  Someone from [facility] will contact you concerning start date and  time for therapy   Contact information:   [phone] [facility] [address] [place], [place] [address]      Follow up with Marianna Payment, MD In 2 weeks.   Specialty:  Orthopedic Surgery   Why:  For suture removal, For wound re-check   Contact information:   [place] [address] [phone]       Follow up with Ria Bush, MD.   Specialty:  [facility]   Contact information:   [place] [address] [phone]       Call Faye Ramsay, MD.   Specialty:  Internal Medicine   Why:  As needed   Contact information:   [address] [place] [address] [phone]        The results of significant diagnostics from this hospitalization (including imaging, microbiology, ancillary and laboratory) are listed below for reference.     Microbiology: Recent Results (from the past 240 hour(s))  MRSA PCR Screening     Status: None   Collection Time: 02/25/15  9:20 PM  Result Value Ref Range Status   MRSA by PCR NEGATIVE NEGATIVE Final    Comment:        The GeneXpert MRSA Assay (FDA approved for NASAL specimens only), is one component of a comprehensive MRSA colonization surveillance program. It is not intended to diagnose MRSA infection nor to guide or monitor treatment for MRSA infections.      Labs: Basic Metabolic Panel:  Recent Labs Lab 02/23/15 1559 02/25/15 0307 02/26/15 0430 02/27/15 0508  NA 143 141 140 140  K 4.2 3.6 3.6 4.3  CL 109 108 108 108  CO2  --  25 21* 25  GLUCOSE 136* 92 114* 132*  BUN 11 9 8 6   CREATININE 0.90 0.99 1.26* 0.96  CALCIUM  --  8.8* 9.1 9.0   Liver Function Tests: No results for input(s): AST, ALT, ALKPHOS, BILITOT, PROT, ALBUMIN in the last 168 hours. No results for input(s): LIPASE, AMYLASE in the last 168 hours. No results for input(s): AMMONIA in the last 168 hours. CBC:  Recent Labs Lab  02/23/15 1546 02/23/15 1559 02/25/15 0307 02/26/15 0430 02/27/15 0508  WBC 9.5  --  8.6 7.4 9.5  HGB 10.7* 12.9 10.6* 11.5* 9.7*  HCT 32.7* 38.0 32.4* 34.7* 30.4*  MCV 82.6  --  82.7 82.8 83.5  PLT 175  --  167 185 179   Cardiac Enzymes: No results for input(s): CKTOTAL, CKMB, CKMBINDEX, TROPONINI in the last 168 hours. BNP: BNP (last 3 results)  Recent Labs  06/03/14 1811  BNP 282.3*   SIGNED: Time coordinating discharge: 30 minutes  Faye Ramsay, MD  [facility] 02/27/2015, 10:35 AM Pager [phone]  If 7PM-7AM, please contact night-coverage [email or web address] Password [email or web address]

## 2015-02-27 NOTE — Care Management Obs Status (Signed)
[place] NOTIFICATION   Patient Details  Name: Alexandria Leach MRN: [id number] Date of Birth: 1950/08/25   Medicare Observation Status Notification Given:  Yes    Ninfa Meeker, RN 02/27/2015, 10:52 AM

## 2015-02-28 ENCOUNTER — Ambulatory Visit: Payer: Self-pay

## 2015-02-28 ENCOUNTER — Ambulatory Visit: Payer: Medicare Other | Admitting: Diagnostic Neuroimaging

## 2015-03-02 ENCOUNTER — Ambulatory Visit: Payer: Self-pay

## 2015-03-04 ENCOUNTER — Other Ambulatory Visit: Payer: Self-pay | Admitting: Family Medicine

## 2015-03-07 ENCOUNTER — Ambulatory Visit: Payer: Self-pay | Admitting: Physical Therapy

## 2015-03-07 ENCOUNTER — Encounter: Payer: Self-pay | Admitting: Family Medicine

## 2015-03-07 ENCOUNTER — Ambulatory Visit (INDEPENDENT_AMBULATORY_CARE_PROVIDER_SITE_OTHER): Payer: Medicare Other | Admitting: Family Medicine

## 2015-03-07 VITALS — BP 120/64 | HR 74 | Temp 98.1°F

## 2015-03-07 DIAGNOSIS — S82852D Displaced trimalleolar fracture of left lower leg, subsequent encounter for closed fracture with routine healing: Secondary | ICD-10-CM | POA: Diagnosis not present

## 2015-03-07 DIAGNOSIS — Z531 Procedure and treatment not carried out because of patient's decision for reasons of belief and group pressure: Secondary | ICD-10-CM

## 2015-03-07 DIAGNOSIS — S82852G Displaced trimalleolar fracture of left lower leg, subsequent encounter for closed fracture with delayed healing: Secondary | ICD-10-CM

## 2015-03-07 DIAGNOSIS — R2689 Other abnormalities of gait and mobility: Secondary | ICD-10-CM | POA: Diagnosis not present

## 2015-03-07 DIAGNOSIS — N183 Chronic kidney disease, stage 3 unspecified: Secondary | ICD-10-CM

## 2015-03-07 DIAGNOSIS — I10 Essential (primary) hypertension: Secondary | ICD-10-CM | POA: Diagnosis not present

## 2015-03-07 LAB — CBC WITH DIFFERENTIAL/PLATELET
BASOS PCT: 0.4 % (ref 0.0–3.0)
Basophils Absolute: 0 10*3/uL (ref 0.0–0.1)
EOS PCT: 2.7 % (ref 0.0–5.0)
Eosinophils Absolute: 0.2 10*3/uL (ref 0.0–0.7)
HCT: 36.2 % (ref 36.0–46.0)
Hemoglobin: 11.6 g/dL — ABNORMAL LOW (ref 12.0–15.0)
LYMPHS ABS: 1.4 10*3/uL (ref 0.7–4.0)
Lymphocytes Relative: 22.2 % (ref 12.0–46.0)
MCHC: 32 g/dL (ref 30.0–36.0)
MCV: 85.1 fl (ref 78.0–100.0)
MONOS PCT: 8.1 % (ref 3.0–12.0)
Monocytes Absolute: 0.5 10*3/uL (ref 0.1–1.0)
NEUTROS PCT: 66.6 % (ref 43.0–77.0)
Neutro Abs: 4.3 10*3/uL (ref 1.4–7.7)
Platelets: 354 10*3/uL (ref 150.0–400.0)
RBC: 4.26 Mil/uL (ref 3.87–5.11)
RDW: 15.5 % (ref 11.5–15.5)
WBC: 6.4 10*3/uL (ref 4.0–10.5)

## 2015-03-07 LAB — BASIC METABOLIC PANEL
BUN: 18 mg/dL (ref 6–23)
CHLORIDE: 107 meq/L (ref 96–112)
CO2: 26 meq/L (ref 19–32)
Calcium: 9.9 mg/dL (ref 8.4–10.5)
Creatinine, Ser: 1.17 mg/dL (ref 0.40–1.20)
GFR: 60.16 mL/min (ref >60.00)
GLUCOSE: 90 mg/dL (ref 70–99)
POTASSIUM: 4.7 meq/L (ref 3.5–5.1)
SODIUM: 140 meq/L (ref 135–145)

## 2015-03-07 NOTE — Patient Instructions (Addendum)
We will consider repeat bone density scan over the next year.  Continue follow up with orthopedics. Talk with other doctors about klonopin and tramadol and gabapentin. Blood work today.

## 2015-03-07 NOTE — Progress Notes (Signed)
BP 120/64 mmHg  Pulse 74  Temp(Src) 98.1 F (36.7 C) (Oral)  SpO2 98%   CC: hosp f/u visit  Subjective:    Patient ID: Alexandria Leach, female    DOB: 1950-08-11, 64 y.o.   MRN: [id number]  HPI: LASHEL JENTZEN is a 64 y.o. female presenting on 03/07/2015 for Hospitalization Follow-up   Recent hospitalization after fall with trimalleolar fracture - tripped over cane. S/p ORIF without fixation of posterior malleolus. HH PT involved ([facility]). On lovenox for DVT ppx (h/o PE in the past). Planned continued lovenox for 14 days. Has f/u with ortho this afternoon. Pain well controlled, has only needed 3 hydrocodone 7.5mg . No constipation. Tolerating lovenox - no noted bleeding or bruising, no nausea. Appetite ok.   She has started using walking regularly and feels more steady.   She completed neurological evaluation for recurrent falls - see Dr Gladstone Lighter note for details.   Admit date: 02/23/2015 Discharge date: 02/27/2015 F/u phone call not performed.  Recommendations for Outpatient Follow-up:  1. Pt will need to follow up with PCP in 2-3 weeks post discharge 2. Please obtain BMP to evaluate electrolytes and kidney function 3. Please also check CBC to evaluate Hg and Hct levels  Discharge Diagnoses:  Principal Problem:  Fracture of ankle, trimalleolar Active Problems:  HYPERCHOLESTEROLEMIA  Essential hypertension  Refusal of blood transfusions as patient is Jehovah's Witness  Sjogren's syndrome ([place])  History of pulmonary embolism  Trimalleolar fracture of left ankle  Diastolic dysfunction  Ankle fracture  Fall  Left trimalleolar fracture  Discharge Condition: Stable Diet recommendation: Heart healthy diet discussed in details   Relevant past medical, surgical, family and social history reviewed and updated as indicated. Interim medical history since our last visit reviewed. Allergies and medications reviewed and updated. Current Outpatient  Prescriptions on File Prior to Visit  Medication Sig  . amLODipine (NORVASC) 10 MG tablet Take 1 tablet by mouth  daily  . antiseptic oral rinse (BIOTENE) LIQD 15 mLs by Mouth Rinse route as needed (Oral hygiene).   Marland Kitchen aspirin 325 MG EC tablet Take 325 mg by mouth every morning.   . cholecalciferol (VITAMIN D) 1000 UNITS tablet Take 1,000 Units by mouth every morning.   . clonazePAM (KLONOPIN) 1 MG tablet Take 1 tablet (1 mg total) by mouth 3 (three) times daily as needed for anxiety.  . cycloSPORINE (RESTASIS) 0.05 % ophthalmic emulsion Place 1 drop into both eyes 2 (two) times daily.  Marland Kitchen enoxaparin (LOVENOX) 40 MG/0.4ML injection Inject 0.4 mLs (40 mg total) into the skin daily.  Marland Kitchen escitalopram (LEXAPRO) 20 MG tablet Take 1 tablet (20 mg total) by mouth daily.  . famotidine (PEPCID) 20 MG tablet Take 1 tablet by mouth two  times daily  . furosemide (LASIX) 20 MG tablet TAKE 1 TABLET BY MOUTH  DAILY AS NEEDED FOR EDEMA.  Marland Kitchen gabapentin (NEURONTIN) 300 MG capsule Take 1 tablet three times a day (Patient taking differently: Take 300 mg by mouth 3 (three) times daily. )  . HYDROcodone-acetaminophen (NORCO) 7.5-325 MG tablet Take 1-2 tablets by mouth every 6 (six) hours as needed for moderate pain.  . hydroxychloroquine (PLAQUENIL) 200 MG tablet Take 1 tablet (200 mg total) by mouth 2 (two) times daily.  . metoprolol succinate (TOPROL-XL) 50 MG 24 hr tablet Take 1/2 tablet in the morning and 1 tablet at night time  . pilocarpine (SALAGEN) 5 MG tablet Take 2.5-5 mg by mouth 2 (two) times daily. Takes 5mg  first thing  in the morning, then 2.5mg  around 2-2:30 PM  . Polyethyl Glycol-Propyl Glycol (SYSTANE) 0.4-0.3 % GEL Apply 1 drop to eye 2 (two) times daily.   Marland Kitchen tiZANidine (ZANAFLEX) 4 MG tablet Take 1 tablet by mouth  every 8 hours as needed  muscle spasms  . traMADol (ULTRAM) 50 MG tablet Take 1 tablet (50 mg total) by mouth every 12 (twelve) hours as needed for severe pain.  Marland Kitchen VOLTAREN 1 % GEL Apply 2 g  topically as needed (FOR PAIN).   Marland Kitchen ZETIA 10 MG tablet Take 10 mg by mouth daily.   Marland Kitchen zolpidem (AMBIEN CR) 12.5 MG CR tablet Take 1 tablet (12.5 mg total) by mouth at bedtime as needed for sleep.   No current facility-administered medications on file prior to visit.    Review of Systems Per HPI unless specifically indicated in ROS section     Objective:    BP 120/64 mmHg  Pulse 74  Temp(Src) 98.1 F (36.7 C) (Oral)  SpO2 98%  Wt Readings from Last 3 Encounters:  02/22/15 154 lb (69.854 kg)  02/21/15 152 lb 8 oz (69.174 kg)  02/03/15 151 lb (68.493 kg)    Physical Exam  Constitutional: She appears well-developed and well-nourished. No distress.  In wheelchair  HENT:  Mouth/Throat: Oropharynx is clear and moist. No oropharyngeal exudate.  Cardiovascular: Normal rate, regular rhythm, normal heart sounds and intact distal pulses.   No murmur heard. Pulmonary/Chest: Effort normal and breath sounds normal. No respiratory distress. She has no wheezes. She has no rales.  Musculoskeletal: She exhibits no edema.  L lower leg in cast  Skin: Skin is warm and dry. No rash noted.  Psychiatric: She has a normal mood and affect.  Nursing note and vitals reviewed.      Assessment & Plan:   Problem List Items Addressed This Visit    Trimalleolar fracture of left ankle - Primary    Recent surgery for this and has f/u with ortho for later today. Will rpt CBC, BMP today. Discussed continued walker use. Continue lovenox in history of pulm embolus.      Refusal of blood transfusions as patient is Jehovah's Witness    Check CBC after recent surgery.      Imbalance    Ongoing issue. Completed outpatient PT, s/p neurology evaluation. Thought related to sjogren syndrome + med related. Pt planning on discussing high risk meds with prescribing physicians.      Essential hypertension   Relevant Orders   Basic metabolic panel   CKD (chronic kidney disease) stage 3, GFR 30-59 ml/min    Relevant Orders   Basic metabolic panel   CBC with Differential/Platelet       Follow up plan: Return if symptoms worsen or fail to improve.

## 2015-03-07 NOTE — Assessment & Plan Note (Signed)
Check CBC after recent surgery.

## 2015-03-07 NOTE — Assessment & Plan Note (Signed)
Ongoing issue. Completed outpatient PT, s/p neurology evaluation. Thought related to sjogren syndrome + med related. Pt planning on discussing high risk meds with prescribing physicians.

## 2015-03-07 NOTE — Assessment & Plan Note (Addendum)
Recent surgery for this and has f/u with ortho for later today. Will rpt CBC, BMP today. Discussed continued walker use. Continue lovenox in history of pulm embolus.

## 2015-03-09 ENCOUNTER — Ambulatory Visit: Payer: Self-pay

## 2015-03-16 DIAGNOSIS — S82852D Displaced trimalleolar fracture of left lower leg, subsequent encounter for closed fracture with routine healing: Secondary | ICD-10-CM | POA: Diagnosis not present

## 2015-03-16 DIAGNOSIS — S82853A Displaced trimalleolar fracture of unspecified lower leg, initial encounter for closed fracture: Secondary | ICD-10-CM | POA: Diagnosis not present

## 2015-04-13 DIAGNOSIS — S82852D Displaced trimalleolar fracture of left lower leg, subsequent encounter for closed fracture with routine healing: Secondary | ICD-10-CM | POA: Diagnosis not present

## 2015-04-16 DIAGNOSIS — S82853A Displaced trimalleolar fracture of unspecified lower leg, initial encounter for closed fracture: Secondary | ICD-10-CM | POA: Diagnosis not present

## 2015-04-16 DIAGNOSIS — R269 Unspecified abnormalities of gait and mobility: Secondary | ICD-10-CM | POA: Diagnosis not present

## 2015-04-18 ENCOUNTER — Ambulatory Visit: Payer: Medicare Other | Attending: Family Medicine | Admitting: Physical Therapy

## 2015-04-18 DIAGNOSIS — R262 Difficulty in walking, not elsewhere classified: Secondary | ICD-10-CM | POA: Diagnosis not present

## 2015-04-18 DIAGNOSIS — Z967 Presence of other bone and tendon implants: Secondary | ICD-10-CM | POA: Diagnosis not present

## 2015-04-18 DIAGNOSIS — Z8781 Personal history of (healed) traumatic fracture: Secondary | ICD-10-CM | POA: Diagnosis not present

## 2015-04-18 DIAGNOSIS — M25572 Pain in left ankle and joints of left foot: Secondary | ICD-10-CM | POA: Diagnosis not present

## 2015-04-18 DIAGNOSIS — Z9889 Other specified postprocedural states: Secondary | ICD-10-CM

## 2015-04-18 DIAGNOSIS — R2681 Unsteadiness on feet: Secondary | ICD-10-CM | POA: Diagnosis not present

## 2015-04-18 NOTE — Therapy (Signed)
[place] [address], [place], [address] Phone: [phone]   Fax:  [phone]  Physical Therapy Evaluation  Patient Details  Name: Alexandria Leach MRN: [id number] Date of Birth: May 22, 1959 Referring Provider: Erlinda Hong  Encounter Date: 04/18/2015      PT End of Session - 04/18/15 1232    Visit Number 1   Number of Visits 16   Date for PT Re-Evaluation 06/13/15   PT Start Time 1013   PT Stop Time 1100   PT Time Calculation (min) 47 min   Activity Tolerance Patient tolerated treatment well   Behavior During Therapy [facility] for tasks assessed/performed      Past Medical History  Diagnosis Date  . Depression   . HTN (hypertension)   . Rheumatic heart disease 1980    s/p mitral valve repair 1980  . HLD (hyperlipidemia)   . Insomnia   . Anxiety   . History of DVT (deep vein thrombosis) several times latest 2012    receives coumadin while hospitalized  . History of pulmonary embolism 2001, 2006    completed coumadin courses  . Fibromyalgia   . Osteoarthritis     shoulders and knees, not RA per Dr Estanislado Pandy, positive ANA, positive Ro  . Personal history of urinary calculi latest 2014  . PONV (postoperative nausea and vomiting)   . Refusal of blood transfusions as patient is Jehovah's Witness   . Sjogren's syndrome ([place])   . CHF (congestive heart failure) ([place])   . Glaucoma     s/p surgery, sees ophtho Q6 mo  . History of rheumatic fever x3  . DDD (degenerative disc disease), cervical   . Lung nodule 09/22/2012    RLL - 74mm, stable since 2014. Thought benign.   . Ankle fracture, left 02/23/2015  . CKD (chronic kidney disease) stage 3, GFR 30-59 ml/min     saw nephrologist Dr Harden Mo    Past Surgical History  Procedure Laterality Date  . Mitral valve repair  1980    open heart  . Tubal ligation  1980  . Vaginal hysterectomy  1992    for fibroids -- partial, ovaries remain  . Cholecystectomy  11/27/2011   Procedure: LAPAROSCOPIC CHOLECYSTECTOMY WITH INTRAOPERATIVE CHOLANGIOGRAM;  Surgeon: Adin Hector, MD;  Location: [place];  Service: General;  Laterality: N/A;  laparoscopic cholecystectomy with choleangiogram umbilical hernia repair  . Umbilical hernia repair  11/27/2011    Procedure: HERNIA REPAIR UMBILICAL ADULT;  Surgeon: Adin Hector, MD;  Location: [place];  Service: General;  Laterality: N/A;  . Breast biopsy Right 2006    benign  . Colonoscopy  07/2014    WNL Amedeo Plenty)  . Dexa  08/2012    normal per patient - no records available  . Orif ankle fracture Left 02/26/2015    Procedure: OPEN REDUCTION INTERNAL FIXATION (ORIF) LEFT TRIMALLEOLAR ANKLE FRACTURE;  Surgeon: [name], MD;  Location: [place];  Service: Orthopedics;  Laterality: Left;    There were no vitals filed for this visit.  Visit Diagnosis:  Status post open reduction with internal fixation (ORIF) of fracture of ankle  Difficulty walking due to ankle and foot joint  Gait instability  Pain in joint, ankle and foot, left      Subjective Assessment - 04/18/15 1018    Subjective Patient with multiple medical issues who had just completed Neuro rehab for balance issues.  She fell and tripped over her cane 02/23/15 and suffered trimalleolar fx and  underwent ORIF on 02/26/15.  She feels her balance issues may have been due to meds. She was unable to fully bear weight since 5 days ago.   She has difficulty walking, she mostly uses a quad cane in the house.  She has min pain overall (heel and knee), swelling,    Pertinent History HTN, Cx DDD, PE in 2006, CHF, mitral valve repair, rheumatic heart disease, fibromyalgia, peripheral neuropahty, fibromyalgia   How long can you stand comfortably? 20 min    How long can you walk comfortably? 30 min    Diagnostic tests XR in ED   Patient Stated Goals To be able to get rid of boot and walk normally in 6 weeks or less.    Currently in Pain? No/denies   Pain Score --  can be as high  as 5/10   Pain Location Ankle   Pain Orientation Left   Pain Descriptors / Indicators Sore   Pain Type Surgical pain;Chronic pain   Pain Onset More than a month ago   Pain Frequency Intermittent   Aggravating Factors  weightbearing    Pain Relieving Factors rest, elevate, does not take meds unless she really needs it.             [facility] PT Assessment - 04/18/15 1027    Assessment   Medical Diagnosis L ORIF ankle    Referring Provider Xu   Onset Date/Surgical Date 02/26/15   Next MD Visit 6 weeks   Prior Therapy for falls, imbalance   Precautions   Precautions Fall   Required Braces or Orthoses Other Brace/Splint   Other Brace/Splint CAM   Restrictions   Weight Bearing Restrictions No   Balance Screen   Has the patient fallen in the past 6 months Yes   How many times? 4  last one was fall 12/8    Has the patient had a decrease in activity level because of a fear of falling?  Yes   Is the patient reluctant to leave their home because of a fear of falling?  Yes   [place] residence   [place]  [id number]   Available Help at Discharge Family   Type of [place] to enter   Entrance Stairs-Number of Steps 3 steps in front 2 steps in back   [place] One level   [place] - single point;Walker - 4 wheels;Grab bars - tub/shower;Shower seat;Other (comment)  elevated toilet seat   Prior Function   Level of Independence Independent   Vocation Retired   Leisure Go out with friends, play Company secretary, Psychologist, occupational with senior citizens (nursing home, ALF)   Cognition   Overall Cognitive Status Impaired/Different from baseline   Memory Impaired   Memory Impairment Decreased short term memory   Observation/Other Assessments-Edema    Edema --  did not measure    Sensation   Light Touch Appears Intact   Coordination   Gross Motor Movements are Fluid and Coordinated Not  tested   Posture/Postural Control   Posture/Postural Control Postural limitations   Postural Limitations Forward head;Posterior pelvic tilt   AROM   AROM Assessment Site --  compensates with hip motions   Left Ankle Dorsiflexion 0   Left Ankle Plantar Flexion 60   Left Ankle Inversion 20   Left Ankle Eversion 10   PROM   Overall PROM  --  ankle EV 20 deg, INV  40 deg    Strength   Right Hip Flexion 5/5   Right Hip ABduction 3+/5   Left Hip Flexion 4+/5   Left Hip ABduction 3+/5   Right Knee Flexion 5/5   Right Knee Extension 5/5   Left Knee Flexion 5/5   Left Knee Extension 4+/5   Right Ankle Dorsiflexion 5/5   Right Ankle Plantar Flexion 5/5   Left Ankle Dorsiflexion 4+/5   Left Ankle Plantar Flexion 4/5   Left Ankle Inversion 3+/5   Left Ankle Eversion 3+/5  very hard for patient to isolate    Palpation   Palpation comment soreness in Lt heel, tender along bilateral well healed incisions            PT Education - 04/18/15 1231    Education provided Yes   Education Details PT/POC, HEP, theraband   Person(s) Educated Patient   Methods Explanation;Demonstration;Handout   Comprehension Verbalized understanding;Tactile cues required;Need further instruction          PT Short Term Goals - 04/18/15 1236    PT SHORT TERM GOAL #1   Title Pt will be I with HEP for ankle AROM and strength    Time 4   Period Weeks   Status New   PT SHORT TERM GOAL #2   Title Pt will be able to walk with LRAD and pain min for up to 20 min    Time 4   Period Weeks   Status New   PT SHORT TERM GOAL #3   Title Pt will complete balance screening and set goal(s).   Time 4   Period Weeks   Status New   PT SHORT TERM GOAL #4   Title Pt will tolerate standing exercises WB and min increase in ankle pain.    Period Weeks   Status New           PT Long Term Goals - 04/18/15 1241    PT LONG TERM GOAL #1   Title Pt will be able to understand concepts of RICE, posture as it relates  to mobility   Time 8   Period Weeks   Status New   PT LONG TERM GOAL #2   Title Pt will be able to walk as needed without restriction of boot as allowed by MD with min occasional pain.    Time 8   Period Weeks   Status New   PT LONG TERM GOAL #3   Title Balance goal to be set.    Time 8   Period Weeks   Status New   PT LONG TERM GOAL #4   Title Pt will be able to demo strength in L ankle to 4/5 in all planes to demo ankle stability needed for gait.    Time 8   Period Weeks   Status New   PT LONG TERM GOAL #5   Title Pt will ascend/descend 4 steps, with no handrails, at MOD I level (increased time) to traverse stairs at home safely. Target date:    Time 8   Period Weeks   Status New               Plan - 04/18/15 1233    Clinical Impression Statement Patient presents for mod complexity evaluation for PT.  She has been doing well since her surgery, but is limited in ankle mobility and stability as a result of injury and surgery.  She reports her balance issue has resolved since she has changed  her medications.  Balance will be addressed to minimize risk in the future.    Pt will benefit from skilled therapeutic intervention in order to improve on the following deficits Abnormal gait;Decreased endurance;Decreased knowledge of use of DME;Decreased strength;Decreased mobility;Decreased balance;Impaired flexibility;Pain;Increased edema;Decreased scar mobility;Increased fascial restricitons;Decreased range of motion;Difficulty walking   Rehab Potential Good   PT Frequency 2x / week   PT Duration 8 weeks   PT Treatment/Interventions ADLs/Self Care Home Management;Neuromuscular re-education;Vestibular;Manual techniques;Balance training;Therapeutic exercise;Therapeutic activities;Functional mobility training;Electrical Stimulation;Stair training;Gait training;Orthotic Fit/Training;Patient/family education;DME Instruction;Cryotherapy;Manual lymph drainage;Passive range of motion   PT  Next Visit Plan review HEP and measure, manage swelling, asked patient to bring shoe along with boot to address gait.     PT Home Exercise Plan ankle T band and ROM           G-Codes - Apr 23, 2015 1303    Functional Assessment Tool Used clinical judgement   Functional Limitation Mobility: Walking and moving around   Mobility: Walking and Moving Around Current Status [phone]) At least 40 percent but less than 60 percent impaired, limited or restricted   Mobility: Walking and Moving Around Goal Status [phone]) At least 20 percent but less than 40 percent impaired, limited or restricted       Problem List Patient Active Problem List   Diagnosis Date Noted  . Fall   . Trimalleolar fracture of left ankle 02/23/2015  . Fracture of ankle, trimalleolar 02/23/2015  . Diastolic dysfunction [id number]  . Right sided sciatica 02/21/2015  . Imbalance 01/12/2015  . Transaminitis 12/24/2014  . DDD (degenerative disc disease), cervical   . Health maintenance examination 10/18/2014  . Advanced care planning/counseling discussion 10/18/2014  . Medicare annual wellness visit, initial 10/18/2014  . Refusal of blood transfusions as patient is Jehovah's Witness   . Sjogren's syndrome ([place])   . CKD (chronic kidney disease) stage 3, GFR 30-59 ml/min   . Glaucoma   . Fibromyalgia   . Osteoarthritis   . History of pulmonary embolism   . Lung nodule 09/22/2012  . Diastolic CHF ([place]) [id number]  . Pneumonia 12/02/2011  . Aortic stenosis 04/22/2011  . Palpitations 08/10/2010  . Dyspnea 08/10/2010  . HOT FLASHES 06/28/2008  . Trochanteric bursitis of right hip 06/28/2008  . ARTHRALGIA 07/01/2007  . BACK PAIN, LEFT 08/25/2006  . GAD (generalized anxiety disorder) 03/28/2006  . MITRAL VALVE REPLACEMENT, HX OF 03/28/2006  . TAH/BSO, HX OF 03/28/2006  . HYPERCHOLESTEROLEMIA 12/31/2005  . MDD (major depressive disorder), recurrent episode ([place]) 12/31/2005  . RHEUMATIC HEART DISEASE 12/31/2005  .  Essential hypertension 12/31/2005  . Chronic insomnia 12/31/2005    PAA,JENNIFER 2015/04/23, 1:17 PM  [facility] [address] [place], [place], [address] Phone: [phone]   Fax:  [phone]  Name: PILAR SHASTEEN MRN: [id number] Date of Birth: [birth year]  Raeford Razor, PT 04/23/15 1:17 PM Phone: [phone] Fax: [phone]

## 2015-04-18 NOTE — Patient Instructions (Signed)
Gastroc / Heel Cord Stretch - Seated With Towel   Sit on floor, towel around ball of foot. Gently pull foot in toward body, stretching heel cord and calf. Hold for __30_ seconds. Repeat on involved leg. Repeat __3_ times. Do _2-3__ times per day.     ROM: Inversion / Eversion   With left leg relaxed, gently turn ankle and foot in and out. Move through full range of motion. Avoid pain. Repeat __10__ times per set. Do __2__ sets per session. Do __2-3__ sessions per day.  http://orth.exer.us/36   Copyright  VHI. All rights reserved.  ROM: Plantar / Dorsiflexion   With left leg relaxed, gently flex and extend ankle. Move through full range of motion. Avoid pain. Repeat __10__ times per set. Do ___2_ sets per session. Do ___2-3_ sessions per day.  http://orth.exer.us/34   Copyright  VHI. All rights reserved.  Ankle Alphabet   Using left ankle and foot only, trace the letters of the alphabet. Perform A to Z. Repeat _1___ times per set. Do _1___ sets per session. Do __2-3__ sessions per day.  http://orth.exer.us/16   Copyright  VHI. All rights reserved.  Ankle Circles   Slowly rotate right foot and ankle clockwise then counterclockwise. Gradually increase range of motion. Avoid pain. Circle __10__ times each direction per set. Do __1-2__ sets per session. Do _2-3___ sessions per day.  http://orth.exer.us/30   Copyright  VHI. All rights reserved.    Inversion: Resisted   Cross legs with right leg underneath, foot in tubing loop. Hold tubing around other foot to resist and turn foot in. Repeat __10__ times per set. Do __2__ sets per session. Do ___2-3_ sessions per day.  http://orth.exer.us/12   Copyright  VHI. All rights reserved.  Eversion: Resisted   With right foot in tubing loop, hold tubing around other foot to resist and turn foot out. Repeat __10__ times per set. Do _2___ sets per session. Do _2-3___ sessions per day.  http://orth.exer.us/14       Copyright  VHI. All rights reserved.

## 2015-04-24 ENCOUNTER — Ambulatory Visit: Payer: Medicare Other | Attending: Family Medicine | Admitting: Physical Therapy

## 2015-04-24 DIAGNOSIS — Z967 Presence of other bone and tendon implants: Secondary | ICD-10-CM | POA: Diagnosis not present

## 2015-04-24 DIAGNOSIS — R2681 Unsteadiness on feet: Secondary | ICD-10-CM

## 2015-04-24 DIAGNOSIS — R269 Unspecified abnormalities of gait and mobility: Secondary | ICD-10-CM | POA: Diagnosis not present

## 2015-04-24 DIAGNOSIS — R262 Difficulty in walking, not elsewhere classified: Secondary | ICD-10-CM | POA: Diagnosis not present

## 2015-04-24 DIAGNOSIS — Z8781 Personal history of (healed) traumatic fracture: Secondary | ICD-10-CM | POA: Insufficient documentation

## 2015-04-24 DIAGNOSIS — M25572 Pain in left ankle and joints of left foot: Secondary | ICD-10-CM

## 2015-04-24 DIAGNOSIS — R29898 Other symptoms and signs involving the musculoskeletal system: Secondary | ICD-10-CM | POA: Diagnosis not present

## 2015-04-24 DIAGNOSIS — Z9889 Other specified postprocedural states: Secondary | ICD-10-CM

## 2015-04-24 NOTE — Therapy (Signed)
[place], [place], [address] Phone: [phone]   Fax:  [phone]  Physical Therapy Treatment  Patient Details  Name: Alexandria Leach MRN: [id number] Date of Birth: 1950/11/17 Referring Provider: Erlinda Hong  Encounter Date: 04/24/2015      PT End of Session - 04/24/15 1305    Visit Number 2   Number of Visits 16   Date for PT Re-Evaluation 06/13/15   PT Start Time 6629   PT Stop Time 1233   PT Time Calculation (min) 44 min   Activity Tolerance Patient tolerated treatment well;No increased pain   Behavior During Therapy [facility] for tasks assessed/performed      Past Medical History  Diagnosis Date  . Depression   . HTN (hypertension)   . Rheumatic heart disease 1980    s/p mitral valve repair 1980  . HLD (hyperlipidemia)   . Insomnia   . Anxiety   . History of DVT (deep vein thrombosis) several times latest 2012    receives coumadin while hospitalized  . History of pulmonary embolism 2001, 2006    completed coumadin courses  . Fibromyalgia   . Osteoarthritis     shoulders and knees, not RA per Dr Estanislado Pandy, positive ANA, positive Ro  . Personal history of urinary calculi latest 2014  . PONV (postoperative nausea and vomiting)   . Refusal of blood transfusions as patient is Jehovah's Witness   . Sjogren's syndrome ([place])   . CHF (congestive heart failure) ([place])   . Glaucoma     s/p surgery, sees ophtho Q6 mo  . History of rheumatic fever x3  . DDD (degenerative disc disease), cervical   . Lung nodule 09/22/2012    RLL - 58m, stable since 2014. Thought benign.   . Ankle fracture, left 02/23/2015  . CKD (chronic kidney disease) stage 3, GFR 30-59 ml/min     saw nephrologist Dr DHarden Mo   Past Surgical History  Procedure Laterality Date  . Mitral valve repair  1980    open heart  . Tubal ligation  1980  . Vaginal hysterectomy  1992    for fibroids -- partial, ovaries remain  . Cholecystectomy   11/27/2011    Procedure: LAPAROSCOPIC CHOLECYSTECTOMY WITH INTRAOPERATIVE CHOLANGIOGRAM;  Surgeon: HAdin Hector MD;  Location[place]  Service: General;  Laterality: N/A;  laparoscopic cholecystectomy with choleangiogram umbilical hernia repair  . Umbilical hernia repair  11/27/2011    Procedure: HERNIA REPAIR UMBILICAL ADULT;  Surgeon: HAdin Hector MD;  [place]  Service: General;  Laterality: N/A;  . Breast biopsy Right 2006    benign  . Colonoscopy  07/2014    WNL (Amedeo Plenty  . Dexa  08/2012    normal per patient - no records available  . Orif ankle fracture Left 02/26/2015    Procedure: OPEN REDUCTION INTERNAL FIXATION (ORIF) LEFT TRIMALLEOLAR ANKLE FRACTURE;  Surgeon: NLeandrew Koyanagi MD;  Location[place]  Service: Orthopedics;  Laterality: Left;    There were no vitals filed for this visit.  Visit Diagnosis:  Status post open reduction with internal fixation (ORIF) of fracture of ankle  Gait instability  Pain in joint, ankle and foot, left  Abnormality of gait  Unsteadiness  Weakness of both lower extremities      Subjective Assessment - 04/24/15 1154    Subjective Wearing a shoe intermittantly  Has a History of plantar fasciitis and heel pain, and neuropathy.    Currently in  Pain? Yes   Pain Score 8    Pain Location Ankle  heel bottom   Pain Orientation Left   Pain Frequency Intermittent   Aggravating Factors  weightbearing,  walking 1 st thing in the am.     Pain Relieving Factors wearing the shoes.              [facility] PT Assessment - 04/24/15 0001    Circumferential Edema   Circumferential - Left  6.5 malleoli,  3 inches superior lateral malleoli 22.5 cm   Figure 8 Edema   Figure 8 - Left  51.5 cm                     [facility] Adult PT Treatment/Exercise - 04/24/15 1158    Manual Therapy   Manual therapy comments PROM 3-5 X pf/DF,  toe flexion/extension . retrograde soft tissue work with leg elevated for edema   Ankle Exercises:  Stretches   Other Stretch prostretch 5 minutes,  easing into Motion  5/10   Ankle Exercises: Seated   Other Seated Ankle Exercises supine/pronation 10 x added to home,  Other AROM exercises peformed supine 5 to 10 X each                PT Education - 04/24/15 1304    Education provided Yes   Education Details supination/pronation   Person(s) Educated Patient   Methods Explanation;Demonstration;Handout   Comprehension Verbalized understanding;Returned demonstration          PT Short Term Goals - 04/24/15 1309    PT SHORT TERM GOAL #1   Title Pt will be I with HEP for ankle AROM and strength    Baseline cues needed   Time 4   Period Weeks   Status On-going   PT SHORT TERM GOAL #2   Title Pt will be able to walk with LRAD and pain min for up to 20 min    Time 4   Period Weeks   Status On-going   PT SHORT TERM GOAL #3   Title Pt will complete balance screening and set goal(s).   Time 4   Period Weeks   Status Unable to assess   PT SHORT TERM GOAL #4   Title Pt will tolerate standing exercises WB and min increase in ankle pain.    Period Weeks   Status On-going           PT Long Term Goals - 04/24/15 1310    PT LONG TERM GOAL #1   Title Pt will be able to understand concepts of RICE, posture as it relates to mobility   Time 8   Period Weeks   Status On-going   PT LONG TERM GOAL #2   Title Pt will be able to walk as needed without restriction of boot as allowed by MD with min occasional pain.    Baseline no boot today.  gait limited   Time 8   Period Weeks   Status Partially Met   PT LONG TERM GOAL #3   Title Balance goal to be set.    Time 8   Period Weeks   Status Unable to assess   PT LONG TERM GOAL #4   Title Pt will be able to demo strength in L ankle to 4/5 in all planes to demo ankle stability needed for gait.    Time 8   Status Unable to assess   PT LONG TERM GOAL #5   Title  Pt will ascend/descend 4 steps, with no handrails, at MOD I level  (increased time) to traverse stairs at home safely. Target date:    Time 8   Period Weeks   Status Unable to assess   PT LONG TERM GOAL #6   Title Pt will improve BERG score to >/=52/56 to decr. falls risk. Target date: 03/09/15.               Plan - 04/24/15 1306    Clinical Impression Statement Progress toward home exercises,  she needed clarification of technique.  Extra time required for instruction.  Quad cane used intermittantly duringsession. she was able to wear a shoe to clinic.  does not tolerate it all day.    PT Next Visit Plan try towel, marble exercises, step ups ,  stretches   PT Home Exercise Plan Supination/pronation to loosen heel   Consulted and Agree with Plan of Care Patient        Problem List Patient Active Problem List   Diagnosis Date Noted  . Fall   . Trimalleolar fracture of left ankle 02/23/2015  . Fracture of ankle, trimalleolar 02/23/2015  . Diastolic dysfunction 59/16/3846  . Right sided sciatica 02/21/2015  . Imbalance 01/12/2015  . Transaminitis 12/24/2014  . DDD (degenerative disc disease), cervical   . Health maintenance examination 10/18/2014  . Advanced care planning/counseling discussion 10/18/2014  . Medicare annual wellness visit, initial 10/18/2014  . Refusal of blood transfusions as patient is Jehovah's Witness   . Sjogren's syndrome ([place])   . CKD (chronic kidney disease) stage 3, GFR 30-59 ml/min   . Glaucoma   . Fibromyalgia   . Osteoarthritis   . History of pulmonary embolism   . Lung nodule 09/22/2012  . Diastolic CHF ([place]) 65/99/3570  . Pneumonia 12/02/2011  . Aortic stenosis 04/22/2011  . Palpitations 08/10/2010  . Dyspnea 08/10/2010  . HOT FLASHES 06/28/2008  . Trochanteric bursitis of right hip 06/28/2008  . ARTHRALGIA 07/01/2007  . BACK PAIN, LEFT 08/25/2006  . GAD (generalized anxiety disorder) 03/28/2006  . MITRAL VALVE REPLACEMENT, HX OF 03/28/2006  . TAH/BSO, HX OF 03/28/2006  . HYPERCHOLESTEROLEMIA  12/31/2005  . MDD (major depressive disorder), recurrent episode ([place]) 12/31/2005  . RHEUMATIC HEART DISEASE 12/31/2005  . Essential hypertension 12/31/2005  . Chronic insomnia 12/31/2005    HARRIS,KAREN 04/24/2015, 1:12 PM  [facility] [address] [place], [place], [address] Phone: [phone]   Fax:  [phone]  Name: Alexandria Leach MRN: [id number] Date of Birth: [birth year]    Melvenia Needles, PTA 04/24/2015 1:12 PM Phone: [phone] Fax: [phone]

## 2015-04-24 NOTE — Patient Instructions (Signed)
Supination/pronation with legs hanging off bed, sitting.  10 X 1  To 2 X a day issued from exercise drawer.

## 2015-04-26 ENCOUNTER — Ambulatory Visit: Payer: Medicare Other | Admitting: Physical Therapy

## 2015-04-26 DIAGNOSIS — Z9889 Other specified postprocedural states: Principal | ICD-10-CM

## 2015-04-26 DIAGNOSIS — R29898 Other symptoms and signs involving the musculoskeletal system: Secondary | ICD-10-CM | POA: Diagnosis not present

## 2015-04-26 DIAGNOSIS — Z967 Presence of other bone and tendon implants: Secondary | ICD-10-CM | POA: Diagnosis not present

## 2015-04-26 DIAGNOSIS — R2681 Unsteadiness on feet: Secondary | ICD-10-CM | POA: Diagnosis not present

## 2015-04-26 DIAGNOSIS — R269 Unspecified abnormalities of gait and mobility: Secondary | ICD-10-CM | POA: Diagnosis not present

## 2015-04-26 DIAGNOSIS — Z8781 Personal history of (healed) traumatic fracture: Secondary | ICD-10-CM | POA: Diagnosis not present

## 2015-04-26 DIAGNOSIS — R262 Difficulty in walking, not elsewhere classified: Secondary | ICD-10-CM

## 2015-04-26 DIAGNOSIS — M25572 Pain in left ankle and joints of left foot: Secondary | ICD-10-CM

## 2015-04-26 NOTE — Therapy (Signed)
[place], [place], [address] Phone: [phone]   Fax:  [phone]  Physical Therapy Treatment  Patient Details  Name: Alexandria Leach MRN: [id number] Date of Birth: [birth year] Referring Provider: Erlinda Hong  Encounter Date: 04/26/2015      PT End of Session - 04/26/15 1413    Visit Number 3   Number of Visits 16   Date for PT Re-Evaluation 06/13/15   PT Start Time 2707   PT Stop Time 1056   PT Time Calculation (min) 41 min   Activity Tolerance Patient tolerated treatment well   Behavior During Therapy [facility] for tasks assessed/performed      Past Medical History  Diagnosis Date  . Depression   . HTN (hypertension)   . Rheumatic heart disease 1980    s/p mitral valve repair 1980  . HLD (hyperlipidemia)   . Insomnia   . Anxiety   . History of DVT (deep vein thrombosis) several times latest 2012    receives coumadin while hospitalized  . History of pulmonary embolism 2001, 2006    completed coumadin courses  . Fibromyalgia   . Osteoarthritis     shoulders and knees, not RA per Dr Estanislado Pandy, positive ANA, positive Ro  . Personal history of urinary calculi latest 2014  . PONV (postoperative nausea and vomiting)   . Refusal of blood transfusions as patient is Jehovah's Witness   . Sjogren's syndrome ([place])   . CHF (congestive heart failure) ([place])   . Glaucoma     s/p surgery, sees ophtho Q6 mo  . History of rheumatic fever x3  . DDD (degenerative disc disease), cervical   . Lung nodule 09/22/2012    RLL - 81m, stable since 2014. Thought benign.   . Ankle fracture, left 02/23/2015  . CKD (chronic kidney disease) stage 3, GFR 30-59 ml/min     saw nephrologist Dr DHarden Mo   Past Surgical History  Procedure Laterality Date  . Mitral valve repair  1980    open heart  . Tubal ligation  1980  . Vaginal hysterectomy  1992    for fibroids -- partial, ovaries remain  . Cholecystectomy  11/27/2011   Procedure: LAPAROSCOPIC CHOLECYSTECTOMY WITH INTRAOPERATIVE CHOLANGIOGRAM;  Surgeon: HAdin Hector MD;  [facility]  Service: General;  Laterality: N/A;  laparoscopic cholecystectomy with choleangiogram umbilical hernia repair  . Umbilical hernia repair  11/27/2011    Procedure: HERNIA REPAIR UMBILICAL ADULT;  Surgeon: HAdin Hector MD;  [facility]  Service: General;  Laterality: N/A;  . Breast biopsy Right 2006    benign  . Colonoscopy  07/2014    WNL (Amedeo Plenty  . Dexa  08/2012    normal per patient - no records available  . Orif ankle fracture Left 02/26/2015    Procedure: OPEN REDUCTION INTERNAL FIXATION (ORIF) LEFT TRIMALLEOLAR ANKLE FRACTURE;  Surgeon: NLeandrew Koyanagi MD;  [facility] [facility]  Service: Orthopedics;  Laterality: Left;    There were no vitals filed for this visit.  Visit Diagnosis:  Status post open reduction with internal fixation (ORIF) of fracture of ankle  Gait instability  Pain in joint, ankle and foot, left  Abnormality of gait  Unsteadiness  Weakness of both lower extremities  Difficulty walking due to ankle and foot joint      Subjective Assessment - 04/26/15 1026    Subjective Wears shoe all day now, min pain with walking, exercise. Only uses cane when she is  going somewhere she is uncertain of.    Currently in Pain? Yes   Pain Score 2    Pain Location Ankle   Pain Orientation Left   Pain Type Surgical pain;Chronic pain   Pain Onset More than a month ago   Pain Frequency Intermittent   Aggravating Factors  WB   Pain Relieving Factors rest, RICE             [facility] PT Assessment - 04/26/15 1054    AROM   Left Ankle Dorsiflexion (p) 4   Left Ankle Plantar Flexion (p) 60   Left Ankle Inversion (p) 35   Left Ankle Eversion (p) 12                     [facility] Adult PT Treatment/Exercise - 04/26/15 1034    Manual Therapy   Manual Therapy Edema management;Joint mobilization;Soft tissue mobilization;Myofascial  release;Passive ROM   Manual therapy comments PROM 3-5 X pf/DF,  toe flexion/extension . retrograde soft tissue work with leg elevated for edema   Joint Mobilization metatarsals   Passive ROM toe extension   Ankle Exercises: Aerobic   Stationary Bike NuStep L 5 UE and LE for 6 min    Ankle Exercises: Standing   Rocker Board 5 minutes;Other (comment)   Rocker Board Limitations worked and A/P and lateral stability   Ankle Exercises: Seated   Towel Crunch 3 reps   Towel Inversion/Eversion 5 reps   Heel Raises 20 reps   Heel Raises Limitations 4 lbs weight   Toe Raise 20 reps   Other Seated Ankle Exercises towel stretch for calf and added in supination/pronation with long sheet                 PT Education - 04/26/15 1412    Education provided Yes   Education Details towel ex, progress, ankle motions.    Person(s) Educated Patient   Methods Explanation   Comprehension Verbalized understanding          PT Short Term Goals - 04/24/15 1309    PT SHORT TERM GOAL #1   Title Pt will be I with HEP for ankle AROM and strength    Baseline cues needed   Time 4   Period Weeks   Status On-going   PT SHORT TERM GOAL #2   Title Pt will be able to walk with LRAD and pain min for up to 20 min    Time 4   Period Weeks   Status On-going   PT SHORT TERM GOAL #3   Title Pt will complete balance screening and set goal(s).   Time 4   Period Weeks   Status Unable to assess   PT SHORT TERM GOAL #4   Title Pt will tolerate standing exercises WB and min increase in ankle pain.    Period Weeks   Status On-going           PT Long Term Goals - 04/24/15 1310    PT LONG TERM GOAL #1   Title Pt will be able to understand concepts of RICE, posture as it relates to mobility   Time 8   Period Weeks   Status On-going   PT LONG TERM GOAL #2   Title Pt will be able to walk as needed without restriction of boot as allowed by MD with min occasional pain.    Baseline no boot today.  gait  limited   Time 8   Period Weeks  Status Partially Met   PT LONG TERM GOAL #3   Title Balance goal to be set.    Time 8   Period Weeks   Status Unable to assess   PT LONG TERM GOAL #4   Title Pt will be able to demo strength in L ankle to 4/5 in all planes to demo ankle stability needed for gait.    Time 8   Status Unable to assess   PT LONG TERM GOAL #5   Title Pt will ascend/descend 4 steps, with no handrails, at MOD I level (increased time) to traverse stairs at home safely. Target date:    Time 8   Period Weeks   Status Unable to assess   PT LONG TERM GOAL #6   Title Pt will improve BERG score to >/=52/56 to decr. falls risk. Target date: 03/09/15.               Plan - 04/26/15 1414    Clinical Impression Statement Pt making gains in function as she can wear shoes and walk for short periods without cane.  She has gained 15 deg  ankle inversion.  Edema and stiffness an issue, cont to progress towards goals.    PT Next Visit Plan ROM, try some standing ankle work, stretching, edema mgmt   PT Home Exercise Plan Supination/pronation to loosen heel, none new today (bands, gastroc)   Consulted and Agree with Plan of Care Patient        Problem List Patient Active Problem List   Diagnosis Date Noted  . Fall   . Trimalleolar fracture of left ankle 02/23/2015  . Fracture of ankle, trimalleolar 02/23/2015  . Diastolic dysfunction 62/83/1517  . Right sided sciatica 02/21/2015  . Imbalance 01/12/2015  . Transaminitis 12/24/2014  . DDD (degenerative disc disease), cervical   . Health maintenance examination 10/18/2014  . Advanced care planning/counseling discussion 10/18/2014  . Medicare annual wellness visit, initial 10/18/2014  . Refusal of blood transfusions as patient is Jehovah's Witness   . Sjogren's syndrome ([place])   . CKD (chronic kidney disease) stage 3, GFR 30-59 ml/min   . Glaucoma   . Fibromyalgia   . Osteoarthritis   . History of pulmonary embolism   .  Lung nodule 09/22/2012  . Diastolic CHF ([place]) 61/60/7371  . Pneumonia 12/02/2011  . Aortic stenosis 04/22/2011  . Palpitations 08/10/2010  . Dyspnea 08/10/2010  . HOT FLASHES 06/28/2008  . Trochanteric bursitis of right hip 06/28/2008  . ARTHRALGIA 07/01/2007  . BACK PAIN, LEFT 08/25/2006  . GAD (generalized anxiety disorder) 03/28/2006  . MITRAL VALVE REPLACEMENT, HX OF 03/28/2006  . TAH/BSO, HX OF 03/28/2006  . HYPERCHOLESTEROLEMIA 12/31/2005  . MDD (major depressive disorder), recurrent episode ([place]) 12/31/2005  . RHEUMATIC HEART DISEASE 12/31/2005  . Essential hypertension 12/31/2005  . Chronic insomnia 12/31/2005    Concepcion Kirkpatrick 04/26/2015, 2:28 PM  [facility] [address] [place], [place], [address] Phone: [phone]   Fax:  [phone]  Name: Alexandria Leach MRN: [id number] Date of Birth: 10/21/1950    Raeford Razor, PT 04/26/2015 2:28 PM Phone: [phone] Fax: [phone]

## 2015-05-01 ENCOUNTER — Ambulatory Visit: Payer: Medicare Other | Admitting: Physical Therapy

## 2015-05-01 DIAGNOSIS — R29898 Other symptoms and signs involving the musculoskeletal system: Secondary | ICD-10-CM

## 2015-05-01 DIAGNOSIS — R269 Unspecified abnormalities of gait and mobility: Secondary | ICD-10-CM | POA: Diagnosis not present

## 2015-05-01 DIAGNOSIS — M25572 Pain in left ankle and joints of left foot: Secondary | ICD-10-CM | POA: Diagnosis not present

## 2015-05-01 DIAGNOSIS — Z967 Presence of other bone and tendon implants: Secondary | ICD-10-CM | POA: Diagnosis not present

## 2015-05-01 DIAGNOSIS — Z9889 Other specified postprocedural states: Principal | ICD-10-CM

## 2015-05-01 DIAGNOSIS — Z8781 Personal history of (healed) traumatic fracture: Secondary | ICD-10-CM

## 2015-05-01 DIAGNOSIS — R262 Difficulty in walking, not elsewhere classified: Secondary | ICD-10-CM | POA: Diagnosis not present

## 2015-05-01 DIAGNOSIS — Z79899 Other long term (current) drug therapy: Secondary | ICD-10-CM | POA: Diagnosis not present

## 2015-05-01 DIAGNOSIS — R2681 Unsteadiness on feet: Secondary | ICD-10-CM

## 2015-05-01 DIAGNOSIS — Z5181 Encounter for therapeutic drug level monitoring: Secondary | ICD-10-CM | POA: Diagnosis not present

## 2015-05-01 NOTE — Therapy (Signed)
[place], [place], [address] Phone: [phone]   Fax:  [phone]  Physical Therapy Treatment, DISCHARGE  Patient Details  Name: Alexandria Leach MRN: [id number] Date of Birth: 07-Mar-1951 Referring Provider: Erlinda Hong  Encounter Date: 05/01/2015      PT End of Session - 05/01/15 1022    Visit Number 4   Number of Visits 16   PT Start Time 1017   PT Stop Time 1056   PT Time Calculation (min) 39 min   Activity Tolerance Patient tolerated treatment well   Behavior During Therapy [facility] for tasks assessed/performed      Past Medical History  Diagnosis Date  . Depression   . HTN (hypertension)   . Rheumatic heart disease 1980    s/p mitral valve repair 1980  . HLD (hyperlipidemia)   . Insomnia   . Anxiety   . History of DVT (deep vein thrombosis) several times latest 2012    receives coumadin while hospitalized  . History of pulmonary embolism 2001, 2006    completed coumadin courses  . Fibromyalgia   . Osteoarthritis     shoulders and knees, not RA per Dr Estanislado Pandy, positive ANA, positive Ro  . Personal history of urinary calculi latest 2014  . PONV (postoperative nausea and vomiting)   . Refusal of blood transfusions as patient is Jehovah's Witness   . Sjogren's syndrome ([place])   . CHF (congestive heart failure) ([place])   . Glaucoma     s/p surgery, sees ophtho Q6 mo  . History of rheumatic fever x3  . DDD (degenerative disc disease), cervical   . Lung nodule 09/22/2012    RLL - 29m, stable since 2014. Thought benign.   . Ankle fracture, left 02/23/2015  . CKD (chronic kidney disease) stage 3, GFR 30-59 ml/min     saw nephrologist Dr DHarden Mo   Past Surgical History  Procedure Laterality Date  . Mitral valve repair  1980    open heart  . Tubal ligation  1980  . Vaginal hysterectomy  1992    for fibroids -- partial, ovaries remain  . Cholecystectomy  11/27/2011    Procedure: LAPAROSCOPIC  CHOLECYSTECTOMY WITH INTRAOPERATIVE CHOLANGIOGRAM;  Surgeon: HAdin Hector MD;  [place] [facility]  Service: General;  Laterality: N/A;  laparoscopic cholecystectomy with choleangiogram umbilical hernia repair  . Umbilical hernia repair  11/27/2011    Procedure: HERNIA REPAIR UMBILICAL ADULT;  Surgeon: HAdin Hector MD;  [place] [facility]  Service: General;  Laterality: N/A;  . Breast biopsy Right 2006    benign  . Colonoscopy  07/2014    WNL (Amedeo Plenty  . Dexa  08/2012    normal per patient - no records available  . Orif ankle fracture Left 02/26/2015    Procedure: OPEN REDUCTION INTERNAL FIXATION (ORIF) LEFT TRIMALLEOLAR ANKLE FRACTURE;  Surgeon: NLeandrew Koyanagi MD;  [facility]  Service: Orthopedics;  Laterality: Left;    There were no vitals filed for this visit.  Visit Diagnosis:  Status post open reduction with internal fixation (ORIF) of fracture of ankle  Gait instability  Pain in joint, ankle and foot, left  Abnormality of gait  Unsteadiness  Weakness of both lower extremities  Difficulty walking due to ankle and foot joint      Subjective Assessment - 05/01/15 1021    Subjective No pain and does not have cane today. Still swells a bit. Did steps and i think i'm ready for  DC.   Currently in Pain? No/denies            [facility] PT Assessment - 05/01/15 1051    Figure 8 Edema   Figure 8 - Left  51.5 cm   AROM   Right/Left Ankle --  needs manual A to avoid hip compensation    Left Ankle Dorsiflexion 5   Left Ankle Plantar Flexion 60   Left Ankle Inversion 37   Left Ankle Eversion 12   Strength   Left Ankle Dorsiflexion 5/5   Left Ankle Plantar Flexion 4/5   Left Ankle Inversion 4/5   Left Ankle Eversion 3+/5           [facility] Adult PT Treatment/Exercise - 05/01/15 1032    Ankle Exercises: Standing   SLS static and min dynamic    Heel Raises 10 reps  3 sets   Other Standing Ankle Exercises calf stretch off step x 30 sec    Other Standing Ankle  Exercises single leg balance activties on foam pad to challenge stability    Ankle Exercises: Aerobic   Stationary Bike 5 min level 2 for warm up    Ankle Exercises: Supine   T-Band INV and EV green band x 20    Other Supine Ankle Exercises circles x 20 each direction    Other Supine Ankle Exercises AROM all planes x 10                 PT Education - 05/01/15 1026    Education provided Yes   Education Details DC and HEP progression    Person(s) Educated Patient   Methods Explanation   Comprehension Verbalized understanding          PT Short Term Goals - 05/01/15 1029    PT SHORT TERM GOAL #1   Title Pt will be I with HEP for ankle AROM and strength    Status Achieved   PT SHORT TERM GOAL #2   Title Pt will be able to walk with LRAD and pain min for up to 20 min    Status Achieved   PT SHORT TERM GOAL #3   Title Pt will complete balance screening and set goal(s).   Status Achieved   PT SHORT TERM GOAL #4   Title Pt will tolerate standing exercises WB and min increase in ankle pain.    PT SHORT TERM GOAL #5   Title Pt will improve BERG score to >/=48/56 to decr. falls risk. Target date: 02/09/15.   Status Deferred           PT Long Term Goals - 05/01/15 1028    PT LONG TERM GOAL #1   Title Pt will be able to understand concepts of RICE, posture as it relates to mobility   Status Achieved   PT LONG TERM GOAL #2   Title Pt will be able to walk as needed without restriction of boot as allowed by MD with min occasional pain.    Status Achieved   PT LONG TERM GOAL #3   Title Balance goal to be set.    Status Deferred   PT LONG TERM GOAL #5   Status Achieved               Plan - 05/01/15 1103    Clinical Impression Statement Patient requesting DC and she has overcome her fear of stairs and is virtually pain free.  She has a significant amount of swelling.  Recommended she speakmwith MD  about compression stockings which she states she was supposed to  have and never did get.     PT Next Visit [place] and Agree with Plan of Care Patient          G-Codes - May 19, 2015 1057    Functional Assessment Tool Used clinical judgement   Functional Limitation Mobility: Walking and moving around   Mobility: Walking and Moving Around Current Status [phone]) At least 20 percent but less than 40 percent impaired, limited or restricted   Mobility: Walking and Moving Around Goal Status [phone]) At least 20 percent but less than 40 percent impaired, limited or restricted   Mobility: Walking and Moving Around Discharge Status [phone]) At least 20 percent but less than 40 percent impaired, limited or restricted      Problem List Patient Active Problem List   Diagnosis Date Noted  . Fall   . Trimalleolar fracture of left ankle 02/23/2015  . Fracture of ankle, trimalleolar 02/23/2015  . Diastolic dysfunction 29/57/4734  . Right sided sciatica 02/21/2015  . Imbalance 01/12/2015  . Transaminitis 12/24/2014  . DDD (degenerative disc disease), cervical   . Health maintenance examination 10/18/2014  . Advanced care planning/counseling discussion 10/18/2014  . Medicare annual wellness visit, initial 10/18/2014  . Refusal of blood transfusions as patient is Jehovah's Witness   . Sjogren's syndrome ([place])   . CKD (chronic kidney disease) stage 3, GFR 30-59 ml/min   . Glaucoma   . Fibromyalgia   . Osteoarthritis   . History of pulmonary embolism   . Lung nodule 09/22/2012  . Diastolic CHF ([place]) 03/70/9643  . Pneumonia 12/02/2011  . Aortic stenosis 04/22/2011  . Palpitations 08/10/2010  . Dyspnea 08/10/2010  . HOT FLASHES 06/28/2008  . Trochanteric bursitis of right hip 06/28/2008  . ARTHRALGIA 07/01/2007  . BACK PAIN, LEFT 08/25/2006  . GAD (generalized anxiety disorder) 03/28/2006  . MITRAL VALVE REPLACEMENT, HX OF 03/28/2006  . TAH/BSO, HX OF 03/28/2006  . HYPERCHOLESTEROLEMIA 12/31/2005  . MDD (major  depressive disorder), recurrent episode ([place]) 12/31/2005  . RHEUMATIC HEART DISEASE 12/31/2005  . Essential hypertension 12/31/2005  . Chronic insomnia 12/31/2005    Evolette Pendell 2015-05-19, 11:06 AM  [facility] [address] [place], [place], [address] Phone: [phone]   Fax:  [phone]  Name: Alexandria Leach MRN: [id number] Date of Birth: [birth year]  PHYSICAL THERAPY DISCHARGE SUMMARY  Visits from Start of Care: 4  Current functional level related to goals / functional outcomes: See above for goals met.    Remaining deficits: Balance confidence, edema, ankle ROM and strength   Education / Equipment: HEP and RICE, balance, gait  Plan: Patient agrees to discharge.  Patient goals were met. Patient is being discharged due to the patient's request.  ?????      Raeford Razor, PT 2015-05-19 11:07 AM Phone: [phone] Fax: [phone]

## 2015-05-03 ENCOUNTER — Encounter: Payer: Self-pay | Admitting: Physical Therapy

## 2015-05-05 ENCOUNTER — Ambulatory Visit (INDEPENDENT_AMBULATORY_CARE_PROVIDER_SITE_OTHER): Payer: Medicare Other | Admitting: Psychiatry

## 2015-05-05 ENCOUNTER — Encounter (HOSPITAL_COMMUNITY): Payer: Self-pay | Admitting: Psychiatry

## 2015-05-05 VITALS — BP 130/80 | Ht 61.0 in | Wt 146.0 lb

## 2015-05-05 DIAGNOSIS — F331 Major depressive disorder, recurrent, moderate: Secondary | ICD-10-CM

## 2015-05-05 MED ORDER — ESCITALOPRAM OXALATE 20 MG PO TABS: 20.0000 mg | ORAL_TABLET | Freq: Every day | ORAL | Status: DC
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
## 2015-05-08 ENCOUNTER — Encounter: Payer: Self-pay | Admitting: Physical Therapy

## 2015-05-10 ENCOUNTER — Telehealth: Payer: Self-pay | Admitting: Cardiology

## 2015-05-10 ENCOUNTER — Encounter: Payer: Self-pay | Admitting: Physical Therapy

## 2015-05-10 NOTE — Telephone Encounter (Signed)
Please call,pt says fluid is building up in her chest. Please call to advise.

## 2015-05-10 NOTE — Telephone Encounter (Signed)
Agree with plan Deretha Ertle  

## 2015-05-10 NOTE — Telephone Encounter (Signed)
Returned patient call. She notes she was feeling "a little short of breath" over past week, thought she had cough/cold symptoms.  She noted she felt worse w/ lying down, particularly supine. Helped for her to prop herself up w/ pillows. States the SOB seemed to worsen yesterday. She took a fluid pill which caused her to urinate "quite a bit".  She recorded a loss of weight - 148 lbs to 145 lbs - between Tuesday and Wednesday AM.  Pt also states relief in symptoms of chest fullness and SOB.  Pt informs me she has not had to take the lasix, other than yesterday, since leaving the hospital. She states she noted some swelling, but had a recent leg injury and is currently going through orthopedic rehab, so felt it was related to this.  I advised OK to take another lasix 20mg  today. Since she already notes improvement w/ symptoms, and overdue for in office care, I will route to Dr. Stanford Breed to see what further advised.

## 2015-05-10 NOTE — Telephone Encounter (Signed)
Spoke with pt, Aware of dr crenshaw's recommendations.  °

## 2015-05-15 ENCOUNTER — Encounter: Payer: Self-pay | Admitting: Physical Therapy

## 2015-05-16 DIAGNOSIS — S82853A Displaced trimalleolar fracture of unspecified lower leg, initial encounter for closed fracture: Secondary | ICD-10-CM | POA: Diagnosis not present

## 2015-05-17 ENCOUNTER — Encounter: Payer: Self-pay | Admitting: Physical Therapy

## 2015-05-25 DIAGNOSIS — S82852D Displaced trimalleolar fracture of left lower leg, subsequent encounter for closed fracture with routine healing: Secondary | ICD-10-CM | POA: Diagnosis not present

## 2015-06-08 ENCOUNTER — Other Ambulatory Visit: Payer: Self-pay | Admitting: Family Medicine

## 2015-06-14 DIAGNOSIS — S82853A Displaced trimalleolar fracture of unspecified lower leg, initial encounter for closed fracture: Secondary | ICD-10-CM | POA: Diagnosis not present

## 2015-06-22 DIAGNOSIS — M797 Fibromyalgia: Secondary | ICD-10-CM | POA: Diagnosis not present

## 2015-06-22 DIAGNOSIS — M3509 Sicca syndrome with other organ involvement: Secondary | ICD-10-CM | POA: Diagnosis not present

## 2015-06-22 DIAGNOSIS — M79672 Pain in left foot: Secondary | ICD-10-CM | POA: Diagnosis not present

## 2015-06-22 DIAGNOSIS — Z09 Encounter for follow-up examination after completed treatment for conditions other than malignant neoplasm: Secondary | ICD-10-CM | POA: Diagnosis not present

## 2015-06-26 ENCOUNTER — Encounter (HOSPITAL_COMMUNITY): Payer: Self-pay | Admitting: Emergency Medicine

## 2015-06-26 ENCOUNTER — Emergency Department (EMERGENCY_DEPARTMENT_HOSPITAL)
Admit: 2015-06-26 | Discharge: 2015-06-26 | Disposition: A | Discharge status: Home / Self Care | Payer: Medicare Other | Attending: Emergency Medicine | Admitting: Emergency Medicine

## 2015-06-26 ENCOUNTER — Telehealth: Payer: Self-pay | Admitting: Family Medicine

## 2015-06-26 ENCOUNTER — Emergency Department (HOSPITAL_COMMUNITY)
Admission: EM | Admit: 2015-06-26 | Discharge: 2015-06-26 | Disposition: A | Discharge status: Home / Self Care | Payer: Medicare Other | Attending: Emergency Medicine | Admitting: Emergency Medicine

## 2015-06-26 DIAGNOSIS — G4700 Insomnia, unspecified: Secondary | ICD-10-CM | POA: Diagnosis not present

## 2015-06-26 DIAGNOSIS — Z86718 Personal history of other venous thrombosis and embolism: Secondary | ICD-10-CM | POA: Insufficient documentation

## 2015-06-26 DIAGNOSIS — Z8781 Personal history of (healed) traumatic fracture: Secondary | ICD-10-CM | POA: Insufficient documentation

## 2015-06-26 DIAGNOSIS — Z7982 Long term (current) use of aspirin: Secondary | ICD-10-CM | POA: Diagnosis not present

## 2015-06-26 DIAGNOSIS — Z79899 Other long term (current) drug therapy: Secondary | ICD-10-CM | POA: Diagnosis not present

## 2015-06-26 DIAGNOSIS — N183 Chronic kidney disease, stage 3 (moderate): Secondary | ICD-10-CM | POA: Insufficient documentation

## 2015-06-26 DIAGNOSIS — Z87442 Personal history of urinary calculi: Secondary | ICD-10-CM | POA: Diagnosis not present

## 2015-06-26 DIAGNOSIS — F329 Major depressive disorder, single episode, unspecified: Secondary | ICD-10-CM | POA: Diagnosis not present

## 2015-06-26 DIAGNOSIS — F419 Anxiety disorder, unspecified: Secondary | ICD-10-CM | POA: Diagnosis not present

## 2015-06-26 DIAGNOSIS — M3500 Sicca syndrome, unspecified: Secondary | ICD-10-CM | POA: Insufficient documentation

## 2015-06-26 DIAGNOSIS — Z86711 Personal history of pulmonary embolism: Secondary | ICD-10-CM | POA: Diagnosis not present

## 2015-06-26 DIAGNOSIS — I129 Hypertensive chronic kidney disease with stage 1 through stage 4 chronic kidney disease, or unspecified chronic kidney disease: Secondary | ICD-10-CM | POA: Insufficient documentation

## 2015-06-26 DIAGNOSIS — M79606 Pain in leg, unspecified: Secondary | ICD-10-CM | POA: Diagnosis not present

## 2015-06-26 DIAGNOSIS — I509 Heart failure, unspecified: Secondary | ICD-10-CM | POA: Diagnosis not present

## 2015-06-26 DIAGNOSIS — M1990 Unspecified osteoarthritis, unspecified site: Secondary | ICD-10-CM | POA: Insufficient documentation

## 2015-06-26 DIAGNOSIS — H409 Unspecified glaucoma: Secondary | ICD-10-CM | POA: Diagnosis not present

## 2015-06-26 DIAGNOSIS — M62830 Muscle spasm of back: Secondary | ICD-10-CM | POA: Diagnosis not present

## 2015-06-26 DIAGNOSIS — E785 Hyperlipidemia, unspecified: Secondary | ICD-10-CM | POA: Insufficient documentation

## 2015-06-26 DIAGNOSIS — M5431 Sciatica, right side: Secondary | ICD-10-CM | POA: Insufficient documentation

## 2015-06-26 DIAGNOSIS — M79604 Pain in right leg: Secondary | ICD-10-CM | POA: Diagnosis not present

## 2015-06-26 MED ORDER — METHOCARBAMOL 500 MG PO TABS: 500.0000 mg | ORAL_TABLET | Freq: Three times a day (TID) | ORAL | Status: DC | PRN

## 2015-06-26 MED ORDER — IBUPROFEN 800 MG PO TABS
800.0000 mg | ORAL_TABLET | Freq: Once | ORAL | Status: AC
Start: 2015-06-26 — End: 2015-06-26
  Administered 2015-06-26: 800 mg via ORAL
  Filled 2015-06-26: qty 1

## 2015-06-26 MED ORDER — IBUPROFEN 800 MG PO TABS: 800.0000 mg | ORAL_TABLET | Freq: Three times a day (TID) | ORAL | Status: DC | PRN

## 2015-06-26 MED ORDER — METHOCARBAMOL 500 MG PO TABS
500.0000 mg | ORAL_TABLET | Freq: Once | ORAL | Status: AC
  Administered 2015-06-26: 500 mg via ORAL
  Filled 2015-06-26: qty 1

## 2015-06-26 NOTE — Progress Notes (Signed)
VASCULAR LAB PRELIMINARY  PRELIMINARY  PRELIMINARY  PRELIMINARY  Right lower extremity venous duplex completed.    Preliminary report:  Right:  No evidence of DVT, superficial thrombosis, or Baker's cyst.   Janifer Adie, RVT, RDMS 06/26/2015, 2:55 PM

## 2015-06-26 NOTE — Discharge Instructions (Signed)
Back Exercises °If you have pain in your back, do these exercises 2-3 times each day or as told by your doctor. When the pain goes away, do the exercises once each day, but repeat the steps more times for each exercise (do more repetitions). If you do not have pain in your back, do these exercises once each day or as told by your doctor. °EXERCISES °Single Knee to Chest °Do these steps 3-5 times in a row for each leg: °1. Lie on your back on a firm bed or the floor with your legs stretched out. °2. Bring one knee to your chest. °3. Hold your knee to your chest by grabbing your knee or thigh. °4. Pull on your knee until you feel a gentle stretch in your lower back. °5. Keep doing the stretch for 10-30 seconds. °6. Slowly let go of your leg and straighten it. °Pelvic Tilt °Do these steps 5-10 times in a row: °1. Lie on your back on a firm bed or the floor with your legs stretched out. °2. Bend your knees so they point up to the ceiling. Your feet should be flat on the floor. °3. Tighten your lower belly (abdomen) muscles to press your lower back against the floor. This will make your tailbone point up to the ceiling instead of pointing down to your feet or the floor. °4. Stay in this position for 5-10 seconds while you gently tighten your muscles and breathe evenly. °Cat-Cow °Do these steps until your lower back bends more easily: °1. Get on your hands and knees on a firm surface. Keep your hands under your shoulders, and keep your knees under your hips. You may put padding under your knees. °2. Let your head hang down, and make your tailbone point down to the floor so your lower back is round like the back of a cat. °3. Stay in this position for 5 seconds. °4. Slowly lift your head and make your tailbone point up to the ceiling so your back hangs low (sags) like the back of a cow. °5. Stay in this position for 5 seconds. °Press-Ups °Do these steps 5-10 times in a row: °1. Lie on your belly (face-down) on the  floor. °2. Place your hands near your head, about shoulder-width apart. °3. While you keep your back relaxed and keep your hips on the floor, slowly straighten your arms to raise the top half of your body and lift your shoulders. Do not use your back muscles. To make yourself more comfortable, you may change where you place your hands. °4. Stay in this position for 5 seconds. °5. Slowly return to lying flat on the floor. °Bridges °Do these steps 10 times in a row: °1. Lie on your back on a firm surface. °2. Bend your knees so they point up to the ceiling. Your feet should be flat on the floor. °3. Tighten your butt muscles and lift your butt off of the floor until your waist is almost as high as your knees. If you do not feel the muscles working in your butt and the back of your thighs, slide your feet 1-2 inches farther away from your butt. °4. Stay in this position for 3-5 seconds. °5. Slowly lower your butt to the floor, and let your butt muscles relax. °If this exercise is too easy, try doing it with your arms crossed over your chest. °Belly Crunches °Do these steps 5-10 times in a row: °1. Lie on your back on a firm bed   or the floor with your legs stretched out. °2. Bend your knees so they point up to the ceiling. Your feet should be flat on the floor. °3. Cross your arms over your chest. °4. Tip your chin a little bit toward your chest but do not bend your neck. °5. Tighten your belly muscles and slowly raise your chest just enough to lift your shoulder blades a tiny bit off of the floor. °6. Slowly lower your chest and your head to the floor. °Back Lifts °Do these steps 5-10 times in a row: °1. Lie on your belly (face-down) with your arms at your sides, and rest your forehead on the floor. °2. Tighten the muscles in your legs and your butt. °3. Slowly lift your chest off of the floor while you keep your hips on the floor. Keep the back of your head in line with the curve in your back. Look at the floor while  you do this. °4. Stay in this position for 3-5 seconds. °5. Slowly lower your chest and your face to the floor. °GET HELP IF: °· Your back pain gets a lot worse when you do an exercise. °· Your back pain does not lessen 2 hours after you exercise. °If you have any of these problems, stop doing the exercises. Do not do them again unless your doctor says it is okay. °GET HELP RIGHT AWAY IF: °· You have sudden, very bad back pain. If this happens, stop doing the exercises. Do not do them again unless your doctor says it is okay. °  °This information is not intended to replace advice given to you by your health care provider. Make sure you discuss any questions you have with your health care provider. °  °Document Released: 04/06/2010 Document Revised: 11/23/2014 Document Reviewed: 04/28/2014 °[employer]vier Interactive Patient Education ©2016 [employer] ° °

## 2015-06-26 NOTE — ED Provider Notes (Signed)
CSN: [id number]     Arrival date & time 06/26/15  1224 History   First MD Initiated Contact with Patient 06/26/15 1554     Chief Complaint  Patient presents with  . Leg Pain     (Consider location/radiation/quality/duration/timing/severity/associated sxs/prior Treatment) Patient is a 65 y.o. female presenting with back pain. The history is provided by the patient.  Back Pain Location:  Lumbar spine Quality:  Shooting and aching Radiates to:  R foot Pain severity:  Moderate Pain is:  Worse during the day Onset quality:  Gradual Duration:  4 days Timing:  Intermittent Progression:  Waxing and waning Chronicity:  New Context: twisting   Context: not jumping from heights, not lifting heavy objects and not recent injury   Context comment:  Known chronic low back pain with reported herniated disc Relieved by:  None tried Worsened by:  Movement and twisting Ineffective treatments:  Lying down and being still Associated symptoms: leg pain   Associated symptoms: no abdominal pain, no bladder incontinence, no bowel incontinence, no chest pain, no dysuria, no fever, no headaches, no numbness, no paresthesias, no pelvic pain, no perianal numbness, no tingling and no weakness   Risk factors: lack of exercise   Risk factors: no recent surgery     Past Medical History  Diagnosis Date  . Depression   . HTN (hypertension)   . Rheumatic heart disease 1980    s/p mitral valve repair 1980  . HLD (hyperlipidemia)   . Insomnia   . Anxiety   . History of DVT (deep vein thrombosis) several times latest 2012    receives coumadin while hospitalized  . History of pulmonary embolism 2001, 2006    completed coumadin courses  . Fibromyalgia   . Osteoarthritis     shoulders and knees, not RA per Dr Estanislado Pandy, positive ANA, positive Ro  . Personal history of urinary calculi latest 2014  . PONV (postoperative nausea and vomiting)   . Refusal of blood transfusions as patient is Jehovah's Witness     . Sjogren's syndrome ([place])   . CHF (congestive heart failure) ([place])   . Glaucoma     s/p surgery, sees ophtho Q6 mo  . History of rheumatic fever x3  . DDD (degenerative disc disease), cervical   . Lung nodule 09/22/2012    RLL - 13mm, stable since 2014. Thought benign.   . Ankle fracture, left 02/23/2015  . CKD (chronic kidney disease) stage 3, GFR 30-59 ml/min     saw nephrologist Dr Harden Mo   Past Surgical History  Procedure Laterality Date  . Mitral valve repair  1980    open heart  . Tubal ligation  1980  . Vaginal hysterectomy  1992    for fibroids -- partial, ovaries remain  . Cholecystectomy  11/27/2011    Procedure: LAPAROSCOPIC CHOLECYSTECTOMY WITH INTRAOPERATIVE CHOLANGIOGRAM;  Surgeon: Adin Hector, MD;  Location: [place];  Service: General;  Laterality: N/A;  laparoscopic cholecystectomy with choleangiogram umbilical hernia repair  . Umbilical hernia repair  11/27/2011    Procedure: HERNIA REPAIR UMBILICAL ADULT;  Surgeon: Adin Hector, MD;  Location: [place];  Service: General;  Laterality: N/A;  . Breast biopsy Right 2006    benign  . Colonoscopy  07/2014    WNL Amedeo Plenty)  . Dexa  08/2012    normal per patient - no records available  . Orif ankle fracture Left 02/26/2015    Procedure: OPEN REDUCTION INTERNAL FIXATION (ORIF) LEFT TRIMALLEOLAR ANKLE FRACTURE;  Surgeon:  [name], MD;  Location: [place];  Service: Orthopedics;  Laterality: Left;   Family History  Problem Relation Age of Onset  . Lupus Sister     and niece  . Cancer Mother     lung (nonsmoker)  . CAD Mother     MI in her 43s  . ALS Mother   . Kidney disease Father   . Alcohol abuse Father   . Diabetes Father   . Cancer Brother     bone  . Cancer Maternal Uncle     bone  . Depression Sister   . Kidney failure Other     on HD  . Diabetes Brother   . Diabetes Sister   . Stroke Maternal Grandmother   . Stroke Sister    Social History  Substance Use Topics  . Smoking status: Never Smoker    . Smokeless tobacco: Never Used  . Alcohol Use: No   OB History    No data available     Review of Systems  Constitutional: Positive for activity change. Negative for fever and chills.  HENT: Negative for congestion, rhinorrhea and sinus pressure.   Respiratory: Negative for cough, chest tightness and shortness of breath.   Cardiovascular: Negative for chest pain.  Gastrointestinal: Negative for nausea, vomiting, abdominal pain, diarrhea and bowel incontinence.  Genitourinary: Negative for bladder incontinence, dysuria, urgency, frequency, flank pain, decreased urine volume, difficulty urinating and pelvic pain.  Musculoskeletal: Positive for back pain and gait problem.  Skin: Negative for rash and wound.  Neurological: Negative for tingling, syncope, weakness, numbness, headaches and paresthesias.  All other systems reviewed and are negative.     Allergies  Cymbalta; Statins; and Sulfa antibiotics  Home Medications   Prior to Admission medications   Medication Sig Start Date End Date Taking? Authorizing Provider  amLODipine (NORVASC) 10 MG tablet Take 1 tablet by mouth  daily 01/25/15   Ria Bush, MD  antiseptic oral rinse (BIOTENE) LIQD 15 mLs by Mouth Rinse route as needed (Oral hygiene).     Historical Provider, MD  aspirin 325 MG EC tablet Take 325 mg by mouth every morning.     Historical Provider, MD  cholecalciferol (VITAMIN D) 1000 UNITS tablet Take 1,000 Units by mouth every morning.     Historical Provider, MD  clonazePAM (KLONOPIN) 1 MG tablet Take 1 tablet (1 mg total) by mouth 3 (three) times daily as needed for anxiety. 02/03/15   Cloria Spring, MD  cycloSPORINE (RESTASIS) 0.05 % ophthalmic emulsion Place 1 drop into both eyes 2 (two) times daily. 07/26/14   Ria Bush, MD  enoxaparin (LOVENOX) 40 MG/0.4ML injection Inject 0.4 mLs (40 mg total) into the skin daily. Patient not taking: Reported on 04/18/2015 02/26/15   [name], MD  escitalopram  (LEXAPRO) 20 MG tablet Take 1 tablet (20 mg total) by mouth daily. 05/05/15 05/04/16  Cloria Spring, MD  famotidine (PEPCID) 20 MG tablet Take 1 tablet by mouth two  times daily 03/06/15   Ria Bush, MD  furosemide (LASIX) 20 MG tablet TAKE 1 TABLET BY MOUTH  DAILY AS NEEDED FOR EDEMA. 01/25/15   Ria Bush, MD  gabapentin (NEURONTIN) 300 MG capsule Take 1 capsule by mouth 3  times a day 06/08/15   Ria Bush, MD  HYDROcodone-acetaminophen [facility]) 7.5-325 MG tablet Take 1-2 tablets by mouth every 6 (six) hours as needed for moderate pain. 02/26/15   [name], MD  hydroxychloroquine (PLAQUENIL) 200 MG tablet  Take 1 tablet (200 mg total) by mouth 2 (two) times daily. 11/05/14   Ria Bush, MD  metoprolol succinate (TOPROL-XL) 50 MG 24 hr tablet TAKE 1/2 TABLET IN THE  MORNING AND 1 TABLET AT  NIGHT TIME 06/08/15   Ria Bush, MD  pilocarpine (SALAGEN) 5 MG tablet Take 2.5-5 mg by mouth 2 (two) times daily. Takes 5mg  first thing in the morning, then 2.5mg  around 2-2:30 PM    Historical Provider, MD  Polyethyl Glycol-Propyl Glycol (SYSTANE) 0.4-0.3 % GEL Apply 1 drop to eye 2 (two) times daily.     Historical Provider, MD  tiZANidine (ZANAFLEX) 4 MG tablet Take 1 tablet by mouth  every 8 hours as needed  muscle spasms 12/07/14   Ria Bush, MD  traMADol (ULTRAM) 50 MG tablet Take 1 tablet (50 mg total) by mouth every 12 (twelve) hours as needed for severe pain. 09/08/14   Ria Bush, MD  VOLTAREN 1 % GEL Apply 2 g topically as needed (FOR PAIN).  11/08/13   Historical Provider, MD  ZETIA 10 MG tablet Take 10 mg by mouth daily.  01/12/14   Historical Provider, MD  zolpidem (AMBIEN CR) 12.5 MG CR tablet Take 1 tablet (12.5 mg total) by mouth at bedtime as needed for sleep. 02/03/15 02/03/16  Cloria Spring, MD   BP 159/76 mmHg  Pulse 74  Temp(Src) 98.3 F (36.8 C) (Oral)  Resp 18  SpO2 97% Physical Exam  Constitutional: She is oriented to person, place, and  time. She appears well-developed and well-nourished. No distress.  HENT:  Head: Normocephalic and atraumatic.  Nose: Nose normal.  Mouth/Throat: Oropharynx is clear and moist.  Eyes: Conjunctivae and EOM are normal. Pupils are equal, round, and reactive to light.  Neck: Normal range of motion. Neck supple.  Cardiovascular: Normal rate, regular rhythm, normal heart sounds and intact distal pulses.   Pulmonary/Chest: Effort normal and breath sounds normal.  Abdominal: Soft. She exhibits no distension. There is no tenderness.  Musculoskeletal: She exhibits tenderness. She exhibits no edema.       Lumbar back: She exhibits tenderness, pain and spasm. She exhibits no bony tenderness.       Back:  +SLR on right.   Neurological: She is alert and oriented to person, place, and time. No cranial nerve deficit. Coordination normal.  No saddle anesthesia  Skin: Skin is warm and dry. No rash noted. She is not diaphoretic.  Nursing note and vitals reviewed.   ED Course  Procedures (including critical care time) Labs Review Labs Reviewed - No data to display  Imaging Review No results found. I have personally reviewed and evaluated these images and lab results as part of my medical decision-making.   EKG Interpretation None      MDM  65 y.o. female presents to the emergency department noting a several day history of right-sided low back pain radiating down her right lower extremity. She is a known history of reported herniated disks and chronic low back pain but denies any history of sciatica. She denies any recent trauma nor significant change in her physical activity. She says that she woke up with her pain. She does have a history of prior pulmonary embolism but denies any symptoms similar to this. Given this history is significant right lower extremity pain DVT ultrasound was ordered and was negative. Exam as above, with palpable muscle spasm noted in her right-sided lumbar paraspinal  musculature with positive straight leg raise on the right. No saddle anesthesia  nor reported history of incontinence. No neurologic deficits noted She was given a dose of Robaxin and ibuprofen while in the emergency department and had improvement in her symptoms. Feel this is likely sciatica and recommended activity as tolerated, NSAIDs, Robaxin for further symptomatic relief. She is recommended to follow-up with her primary care physician in about a week to monitor for improvement in her symptoms. Return precautions were given and she was discharged home in good condition.   Final diagnoses:  Leg pain       Zen[place]ia Jarred, DO 06/27/15 [place], DO 06/27/15 1329

## 2015-06-26 NOTE — ED Notes (Signed)
Pt sts right leg pain in upper area from knee to hip; pt sts hx of DVT; pt denies injury; pt sts pain with walking

## 2015-06-26 NOTE — Telephone Encounter (Signed)
[facility]  Patient Name: Alexandria Leach  DOB: 1951-03-07    Initial Comment Caller states after walking on RT leg having pain when sits down. from knee to hip; history blood clots;    Nurse Assessment  Nurse: Wayne Sever, RN, Tillie Rung Date/Time (Eastern Time): 06/26/2015 11:23:50 AM  Confirm and document reason for call. If symptomatic, describe symptoms. You must click the next button to save text entered. ---Caller states she started with bilateral leg pain for a few days and now she only has pain on the right side. She states it goes knee to hip. She states she also has Fibromyalgia and a history of blood clots also  Has the patient traveled out of the country within the last 30 days? ---Not Applicable  Does the patient have any new or worsening symptoms? ---Yes  Will a triage be completed? ---Yes  Related visit to physician within the last 2 weeks? ---No  Does the PT have any chronic conditions? (i.e. diabetes, asthma, etc.) ---Yes  List chronic conditions. ---Blood Clots, Shogram's Syndrome, Fibromyalgia, Rheumatoid Arthritis, Heart Disease, Open Heart Surgery, 2 Leaky Heart Valves, HTN  Is this a behavioral health or substance abuse call? ---No     Guidelines    Guideline Title Affirmed Question Affirmed Notes  Leg Pain [1] Thigh or calf pain AND [2] only 1 side AND [3] present > 1 hour    Final Disposition User   See Physician within 4 Hours (or PCP triage) Wayne Sever, RN, Tillie Rung    Comments  No appointments left for the day. Sent patient to ED   Referrals  [facility] - ED   Disagree/Comply: Comply

## 2015-07-15 DIAGNOSIS — S82853A Displaced trimalleolar fracture of unspecified lower leg, initial encounter for closed fracture: Secondary | ICD-10-CM | POA: Diagnosis not present

## 2015-08-02 ENCOUNTER — Ambulatory Visit (HOSPITAL_COMMUNITY): Payer: Self-pay | Admitting: Psychiatry

## 2015-08-03 ENCOUNTER — Other Ambulatory Visit: Payer: Self-pay

## 2015-08-03 DIAGNOSIS — Z1231 Encounter for screening mammogram for malignant neoplasm of breast: Secondary | ICD-10-CM

## 2015-08-14 ENCOUNTER — Other Ambulatory Visit: Payer: Self-pay | Admitting: Family Medicine

## 2015-08-14 DIAGNOSIS — S82853A Displaced trimalleolar fracture of unspecified lower leg, initial encounter for closed fracture: Secondary | ICD-10-CM | POA: Diagnosis not present

## 2015-08-14 DIAGNOSIS — E7800 Pure hypercholesterolemia, unspecified: Secondary | ICD-10-CM

## 2015-08-14 DIAGNOSIS — I10 Essential (primary) hypertension: Secondary | ICD-10-CM

## 2015-08-14 DIAGNOSIS — N183 Chronic kidney disease, stage 3 unspecified: Secondary | ICD-10-CM

## 2015-08-15 ENCOUNTER — Other Ambulatory Visit (INDEPENDENT_AMBULATORY_CARE_PROVIDER_SITE_OTHER): Payer: Medicare Other

## 2015-08-15 DIAGNOSIS — N183 Chronic kidney disease, stage 3 unspecified: Secondary | ICD-10-CM

## 2015-08-15 DIAGNOSIS — E7800 Pure hypercholesterolemia, unspecified: Secondary | ICD-10-CM | POA: Diagnosis not present

## 2015-08-15 DIAGNOSIS — M069 Rheumatoid arthritis, unspecified: Secondary | ICD-10-CM | POA: Diagnosis not present

## 2015-08-15 DIAGNOSIS — I10 Essential (primary) hypertension: Secondary | ICD-10-CM

## 2015-08-15 LAB — COMPREHENSIVE METABOLIC PANEL
ALT: 18 U/L (ref 0–35)
AST: 26 U/L (ref 0–37)
Albumin: 4.1 g/dL (ref 3.5–5.2)
Alkaline Phosphatase: 64 U/L (ref 39–117)
BILIRUBIN TOTAL: 0.4 mg/dL (ref 0.2–1.2)
BUN: 10 mg/dL (ref 6–23)
CO2: 30 meq/L (ref 19–32)
Calcium: 9.7 mg/dL (ref 8.4–10.5)
Chloride: 107 mEq/L (ref 96–112)
Creatinine, Ser: 1 mg/dL (ref 0.40–1.20)
GFR: 72 mL/min (ref >60.00)
GLUCOSE: 87 mg/dL (ref 70–99)
Potassium: 4.2 mEq/L (ref 3.5–5.1)
SODIUM: 140 meq/L (ref 135–145)
Total Protein: 7 g/dL (ref 6.0–8.3)

## 2015-08-15 LAB — CBC WITH DIFFERENTIAL/PLATELET
Basophils Absolute: 0 10*3/uL (ref 0.0–0.1)
Basophils Relative: 0.6 % (ref 0.0–3.0)
EOS PCT: 3.6 % (ref 0.0–5.0)
Eosinophils Absolute: 0.2 10*3/uL (ref 0.0–0.7)
HCT: 39 % (ref 36.0–46.0)
HEMOGLOBIN: 12.6 g/dL (ref 12.0–15.0)
Lymphocytes Relative: 54.2 % — ABNORMAL HIGH (ref 12.0–46.0)
Lymphs Abs: 2.5 10*3/uL (ref 0.7–4.0)
MCHC: 32.4 g/dL (ref 30.0–36.0)
MCV: 82.7 fl (ref 78.0–100.0)
MONOS PCT: 9 % (ref 3.0–12.0)
Monocytes Absolute: 0.4 10*3/uL (ref 0.1–1.0)
Neutro Abs: 1.5 10*3/uL (ref 1.4–7.7)
Neutrophils Relative %: 32.6 % — ABNORMAL LOW (ref 43.0–77.0)
Platelets: 194 10*3/uL (ref 150.0–400.0)
RBC: 4.71 Mil/uL (ref 3.87–5.11)
RDW: 14.9 % (ref 11.5–15.5)
WBC: 4.6 10*3/uL (ref 4.0–10.5)

## 2015-08-15 LAB — LIPID PANEL
CHOL/HDL RATIO: 3
Cholesterol: 245 mg/dL — ABNORMAL HIGH (ref 0–200)
HDL: 82.6 mg/dL (ref >39.00)
LDL Cholesterol: 147 mg/dL — ABNORMAL HIGH (ref 0–99)
NONHDL: 162.26
Triglycerides: 74 mg/dL (ref 0.0–149.0)
VLDL: 14.8 mg/dL (ref 0.0–40.0)

## 2015-08-16 ENCOUNTER — Ambulatory Visit
Admission: RE | Admit: 2015-08-16 | Discharge: 2015-08-16 | Disposition: A | Discharge status: Home / Self Care | Payer: Medicare Other | Source: Ambulatory Visit

## 2015-08-16 DIAGNOSIS — Z1231 Encounter for screening mammogram for malignant neoplasm of breast: Secondary | ICD-10-CM

## 2015-08-17 LAB — HM MAMMOGRAPHY

## 2015-08-18 ENCOUNTER — Ambulatory Visit (INDEPENDENT_AMBULATORY_CARE_PROVIDER_SITE_OTHER): Payer: Medicare Other | Admitting: Psychiatry

## 2015-08-18 ENCOUNTER — Encounter (HOSPITAL_COMMUNITY): Payer: Self-pay | Admitting: Psychiatry

## 2015-08-18 VITALS — BP 153/90 | HR 70 | Ht 61.0 in | Wt 152.0 lb

## 2015-08-18 DIAGNOSIS — F331 Major depressive disorder, recurrent, moderate: Secondary | ICD-10-CM | POA: Diagnosis not present

## 2015-08-18 MED ORDER — ESCITALOPRAM OXALATE 20 MG PO TABS: 20.0000 mg | ORAL_TABLET | Freq: Two times a day (BID) | ORAL | Status: DC

## 2015-08-18 MED ORDER — ALPRAZOLAM 1 MG PO TABS: 1.0000 mg | ORAL_TABLET | Freq: Three times a day (TID) | ORAL | Status: DC
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
## 2015-08-21 ENCOUNTER — Encounter: Payer: Self-pay | Admitting: *Deleted

## 2015-08-22 ENCOUNTER — Encounter: Payer: Self-pay | Admitting: Family Medicine

## 2015-08-23 ENCOUNTER — Telehealth: Payer: Self-pay | Admitting: Cardiology

## 2015-08-23 DIAGNOSIS — I059 Rheumatic mitral valve disease, unspecified: Secondary | ICD-10-CM

## 2015-08-23 DIAGNOSIS — I350 Nonrheumatic aortic (valve) stenosis: Secondary | ICD-10-CM

## 2015-08-23 NOTE — Telephone Encounter (Signed)
Spoke with pt, Follow up scheduled for echo and ov with dr Stanford Breed

## 2015-08-23 NOTE — Telephone Encounter (Signed)
New Message  Pt stated that she normally has an echo before ROV in Aug- no order in syst. Please call back and discuss.

## 2015-08-29 ENCOUNTER — Ambulatory Visit (INDEPENDENT_AMBULATORY_CARE_PROVIDER_SITE_OTHER): Payer: Medicare Other | Admitting: Family Medicine

## 2015-08-29 ENCOUNTER — Encounter: Payer: Self-pay | Admitting: Family Medicine

## 2015-08-29 ENCOUNTER — Telehealth: Payer: Self-pay | Admitting: Family Medicine

## 2015-08-29 VITALS — BP 136/70 | HR 76 | Temp 98.0°F | Ht 62.0 in | Wt 149.5 lb

## 2015-08-29 DIAGNOSIS — M3500 Sicca syndrome, unspecified: Secondary | ICD-10-CM | POA: Diagnosis not present

## 2015-08-29 DIAGNOSIS — I10 Essential (primary) hypertension: Secondary | ICD-10-CM | POA: Diagnosis not present

## 2015-08-29 DIAGNOSIS — E7800 Pure hypercholesterolemia, unspecified: Secondary | ICD-10-CM | POA: Diagnosis not present

## 2015-08-29 DIAGNOSIS — I5032 Chronic diastolic (congestive) heart failure: Secondary | ICD-10-CM | POA: Diagnosis not present

## 2015-08-29 DIAGNOSIS — E2839 Other primary ovarian failure: Secondary | ICD-10-CM

## 2015-08-29 DIAGNOSIS — M79601 Pain in right arm: Secondary | ICD-10-CM

## 2015-08-29 NOTE — Progress Notes (Addendum)
BP 136/70 mmHg  Pulse 76  Temp(Src) 98 F (36.7 C) (Oral)  Ht 5\' 2"  (1.575 m)  Wt 149 lb 8 oz (67.813 kg)  BMI 27.34 kg/m2   CC: f/u visit  Subjective:    Patient ID: Alexandria Leach, female    DOB: [birth year], 65 y.o.   MRN: [id number]  HPI: Alexandria Leach is a 65 y.o. female presenting on 08/29/2015 for Annual Exam   Was here for medicare wellness visit however too early for this (last done 10/2014).   Psych - Dr Harrington Challenger. Sees for depression with anxiety. Started on xanax and lexapro. Klonopin was stopped.   Sjogren syndrome stable recently. Followed by rheumatology Dr Estanislado Pandy. Recent trimalleolar fracture after fall related to sjogren + high risk meds.   Ongoing R hip pain due to sciatica + bursitis. Recently seen at ER for this - treated with robaxin and ibuprofen. Pt has not been taking robaxin because she already takes zanaflex.   Last saw Dr Leta Baptist neuro with MRI brain and cervical spine 11/2014. Noticing worsening numbness R arm - starts in shoulder/neck and travels down fingers.   CHF - followed by Dr Stanford Breed - pending rpt echocardiogram.   Relevant past medical, surgical, family and social history reviewed and updated as indicated. Interim medical history since our last visit reviewed. Allergies and medications reviewed and updated. Current Outpatient Prescriptions on File Prior to Visit  Medication Sig  . ALPRAZolam (XANAX) 1 MG tablet Take 1 tablet (1 mg total) by mouth 3 (three) times daily.  Marland Kitchen amLODipine (NORVASC) 10 MG tablet Take 1 tablet by mouth  daily  . amoxicillin (AMOXIL) 500 MG capsule Take 2,000 mg by mouth See admin instructions. Take 4 capsules (2000 mg) by mouth one hour prior to dental appointment - last dose December 2016  . antiseptic oral rinse (BIOTENE) LIQD 15 mLs by Mouth Rinse route at bedtime.   Marland Kitchen aspirin 325 MG EC tablet Take 325 mg by mouth daily.   . cholecalciferol (VITAMIN D) 1000 UNITS tablet Take 1,000 Units by mouth daily.   .  cycloSPORINE (RESTASIS) 0.05 % ophthalmic emulsion Place 1 drop into both eyes 2 (two) times daily.  . diclofenac sodium (VOLTAREN) 1 % GEL Apply 1 application topically 2 (two) times daily as needed (pain).  Marland Kitchen escitalopram (LEXAPRO) 20 MG tablet Take 1 tablet (20 mg total) by mouth 2 (two) times daily.  Marland Kitchen ezetimibe (ZETIA) 10 MG tablet Take 10 mg by mouth at bedtime.  . famotidine (PEPCID) 20 MG tablet Take 1 tablet by mouth two  times daily  . furosemide (LASIX) 20 MG tablet TAKE 1 TABLET BY MOUTH  DAILY AS NEEDED FOR EDEMA. (Patient taking differently: TAKE 1 TABLET BY MOUTH  DAILY AS NEEDED FOR WEIGHT GAIN OF 3 LBS IN 24 HOURS)  . gabapentin (NEURONTIN) 300 MG capsule Take 1 capsule by mouth 3  times a day  . hydroxychloroquine (PLAQUENIL) 200 MG tablet Take 200 mg by mouth daily.   Marland Kitchen ibuprofen (ADVIL,MOTRIN) 800 MG tablet Take 1 tablet (800 mg total) by mouth every 8 (eight) hours as needed for moderate pain.  . metoprolol succinate (TOPROL-XL) 50 MG 24 hr tablet TAKE 1/2 TABLET IN THE  MORNING AND 1 TABLET AT  NIGHT TIME  . pilocarpine (SALAGEN) 5 MG tablet Take 2.5-5 mg by mouth 2 (two) times daily. Take 1 tablet (5 mg) by mouth every morning and 1/2 tablet (2.5 mg) at 2-2:30pm  . Polyethyl Glycol-Propyl Glycol (SYSTANE)  0.4-0.3 % GEL Place 1 drop into both eyes 2 (two) times daily.   Marland Kitchen tiZANidine (ZANAFLEX) 4 MG tablet Take 1 tablet by mouth  every 8 hours as needed  muscle spasms  . traMADol (ULTRAM) 50 MG tablet Take 1 tablet (50 mg total) by mouth every 12 (twelve) hours as needed for severe pain.  Marland Kitchen zolpidem (AMBIEN CR) 12.5 MG CR tablet Take 1 tablet (12.5 mg total) by mouth at bedtime as needed for sleep.   No current facility-administered medications on file prior to visit.    Review of Systems Per HPI unless specifically indicated in ROS section     Objective:    BP 136/70 mmHg  Pulse 76  Temp(Src) 98 F (36.7 C) (Oral)  Ht 5\' 2"  (1.575 m)  Wt 149 lb 8 oz (67.813 kg)   BMI 27.34 kg/m2  Wt Readings from Last 3 Encounters:  08/29/15 149 lb 8 oz (67.813 kg)  08/18/15 152 lb (68.947 kg)  05/05/15 146 lb (66.225 kg)    Physical Exam  Constitutional: She is oriented to person, place, and time. She appears well-developed and well-nourished. No distress.  HENT:  Mouth/Throat: Oropharynx is clear and moist. No oropharyngeal exudate.  Eyes: Conjunctivae and EOM are normal. Pupils are equal, round, and reactive to light.  Neck: No thyromegaly present.  Cardiovascular: Normal rate, regular rhythm, normal heart sounds and intact distal pulses.   No murmur heard. Pulmonary/Chest: Effort normal and breath sounds normal. No respiratory distress. She has no wheezes. She has no rales.  Musculoskeletal: She exhibits no edema.  Tender to palpation lower cervical neck midline as well as R trapezius mm tenderness present  Lymphadenopathy:    She has no cervical adenopathy.  Neurological: She is alert and oriented to person, place, and time.  Neg tinel and phalen 5/5 strength BUE Grip strength intact  Skin: Skin is warm and dry. No rash noted.  Nursing note and vitals reviewed.  Results for orders placed or performed in visit on 08/21/15  HM MAMMOGRAPHY  Result Value Ref Range   HM Mammogram 0-4 Bi-Rad 0-4 Bi-Rad, Self Reported Normal      Assessment & Plan:   Problem List Items Addressed This Visit    HYPERCHOLESTEROLEMIA - Primary    Complaint with zetia however discussed LDL not at goal - consider retrial statin at CPE - despite h/o intolerance to statins.      Essential hypertension    Chronic, stable. Continue current regimen.       Diastolic CHF ([place])    Seems euvolemic today.       Sjogren's syndrome ([place])    Appreciate rheum care of patient.       Right arm pain    With numbness, not consistent with CTS. 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  Discussed options, pt may return to Dr Leta Baptist for further evaluation.        Other Visit Diagnoses    Estrogen deficiency        Relevant Orders    DG Bone Density        Follow up plan: Return in about 3 months (around 11/29/2015), or as needed, for medicare wellness visit.  Ria Bush, MD

## 2015-08-29 NOTE — Assessment & Plan Note (Signed)
With numbness, not consistent with CTS. 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 Discussed options, pt may return to Dr Leta Baptist for further evaluation.

## 2015-08-29 NOTE — Assessment & Plan Note (Signed)
Seems euvolemic today.  

## 2015-08-29 NOTE — Assessment & Plan Note (Signed)
Appreciate rheum care of patient.  

## 2015-08-29 NOTE — Telephone Encounter (Signed)
This was ordered today thanks.

## 2015-08-29 NOTE — Addendum Note (Signed)
Addended by: Ria Bush on: 08/29/2015 12:01 PM   Modules accepted: Orders, SmartSet

## 2015-08-29 NOTE — Assessment & Plan Note (Signed)
Complaint with zetia however discussed LDL not at goal - consider retrial statin at CPE - despite h/o intolerance to statins.

## 2015-08-29 NOTE — Assessment & Plan Note (Signed)
Chronic, stable. Continue current regimen. 

## 2015-08-29 NOTE — Patient Instructions (Addendum)
We will refer you for bone density scan at the breast center in [place]. You are doing well today. I do think right arm symptoms are coming from neck - you did have some narrowing of the opening at C5/6 where nerve root comes out.  Return in 3-4 months for medicare wellness visit

## 2015-08-29 NOTE — Telephone Encounter (Signed)
Need order for bone density  Am appointment can go any day

## 2015-08-29 NOTE — Progress Notes (Signed)
Pre visit review using our clinic review tool, if applicable. No additional management support is needed unless otherwise documented below in the visit note. 

## 2015-08-30 NOTE — Telephone Encounter (Signed)
6/29 pt aware

## 2015-08-31 ENCOUNTER — Telehealth (HOSPITAL_COMMUNITY): Payer: Self-pay | Admitting: *Deleted

## 2015-08-31 NOTE — Telephone Encounter (Signed)
Prior authorization for Lexapro received. Called [phone] was told that rejection was due to pharmacy trying to fill too soon. It went through on 6/12 for 90 day supply. No authorization required.

## 2015-09-04 ENCOUNTER — Telehealth (HOSPITAL_COMMUNITY): Payer: Self-pay | Admitting: *Deleted

## 2015-09-04 NOTE — Telephone Encounter (Signed)
noted 

## 2015-09-04 NOTE — Telephone Encounter (Signed)
Spoke with Merry Proud from pt pharmacy due to previous phone call. Per Merry Proud the pharmacist, they are just trying to confirm that Dr. Harrington Challenger is prescribing pt Lexapro 20 mg 1 tablets BID. Per Merry Proud, this is a really high dose and they was just trying to confirm this. Informed Merry Proud, that per pt notes from 08-18-15, provider did prescribe this for pt. Merry Proud showed understanding. Informed Merry Proud that office also received prior auth request for this medication and what was said to office. Per Merry Proud, he is unable to see that part but he was only calling for the clarifications on the dose amount.

## 2015-09-04 NOTE — Telephone Encounter (Signed)
voice message from pharmacy regarding Lexapro.  need instructions for medication.   Opt. 1 and reference # [id number]

## 2015-09-04 NOTE — Telephone Encounter (Signed)
completed

## 2015-09-04 NOTE — Telephone Encounter (Signed)
voice message from pharmacy regarding Lexapro.  need instructions for medication.

## 2015-09-14 ENCOUNTER — Ambulatory Visit
Admission: RE | Admit: 2015-09-14 | Discharge: 2015-09-14 | Disposition: A | Discharge status: Home / Self Care | Payer: Medicare Other | Source: Ambulatory Visit | Attending: Family Medicine | Admitting: Family Medicine

## 2015-09-14 DIAGNOSIS — M85852 Other specified disorders of bone density and structure, left thigh: Secondary | ICD-10-CM | POA: Diagnosis not present

## 2015-09-14 DIAGNOSIS — E2839 Other primary ovarian failure: Secondary | ICD-10-CM

## 2015-09-14 DIAGNOSIS — Z780 Asymptomatic menopausal state: Secondary | ICD-10-CM | POA: Diagnosis not present

## 2015-09-14 DIAGNOSIS — S82853A Displaced trimalleolar fracture of unspecified lower leg, initial encounter for closed fracture: Secondary | ICD-10-CM | POA: Diagnosis not present

## 2015-09-18 ENCOUNTER — Encounter: Payer: Self-pay | Admitting: Family Medicine

## 2015-09-18 ENCOUNTER — Other Ambulatory Visit: Payer: Self-pay | Admitting: *Deleted

## 2015-09-18 DIAGNOSIS — M8580 Other specified disorders of bone density and structure, unspecified site: Secondary | ICD-10-CM

## 2015-09-18 HISTORY — DX: Other specified disorders of bone density and structure, unspecified site: M85.80

## 2015-09-18 MED ORDER — AMOXICILLIN 500 MG PO CAPS: 2000.0000 mg | ORAL_CAPSULE | ORAL | Status: DC

## 2015-09-18 MED ORDER — AMOXICILLIN 500 MG PO CAPS: ORAL_CAPSULE | ORAL | Status: DC

## 2015-09-18 MED ORDER — EZETIMIBE 10 MG PO TABS: 10.0000 mg | ORAL_TABLET | Freq: Every day | ORAL | Status: DC

## 2015-09-20 DIAGNOSIS — R300 Dysuria: Secondary | ICD-10-CM | POA: Diagnosis not present

## 2015-09-20 DIAGNOSIS — Z79899 Other long term (current) drug therapy: Secondary | ICD-10-CM | POA: Diagnosis not present

## 2015-10-04 ENCOUNTER — Telehealth: Payer: Self-pay

## 2015-10-04 DIAGNOSIS — M5030 Other cervical disc degeneration, unspecified cervical region: Secondary | ICD-10-CM

## 2015-10-04 DIAGNOSIS — M5412 Radiculopathy, cervical region: Secondary | ICD-10-CM

## 2015-10-04 DIAGNOSIS — M79601 Pain in right arm: Secondary | ICD-10-CM

## 2015-10-04 NOTE — Telephone Encounter (Signed)
Cati Uhle [phone] [facility] Jerilynn Mages)  Astasia stopped by the office today and would like a referral to  [facility] [address] Leta Baptist, [address], [place], [place] [address]  [phone]  Rheumatologist not treating her for new condition, treating only Fibromyalgia, Sygones syndrome

## 2015-10-06 NOTE — Telephone Encounter (Signed)
Referral placed for worsening R arm pain and numbness.

## 2015-10-06 NOTE — Telephone Encounter (Signed)
She said she had spoken with you about right hand/arm numbness that goes up to her neck. She said she has had MRI that showed pinched nerve.

## 2015-10-06 NOTE — Telephone Encounter (Signed)
Can we get diagnosis she wants to see neuro for? Is it gait unsteadiness? Previously seen 02/2015 by him for this. Any worsening?

## 2015-10-12 ENCOUNTER — Observation Stay (HOSPITAL_COMMUNITY): Payer: Medicare Other

## 2015-10-12 ENCOUNTER — Telehealth: Payer: Self-pay | Admitting: Family Medicine

## 2015-10-12 ENCOUNTER — Emergency Department (HOSPITAL_COMMUNITY): Payer: Medicare Other

## 2015-10-12 ENCOUNTER — Encounter (HOSPITAL_COMMUNITY): Payer: Self-pay

## 2015-10-12 ENCOUNTER — Observation Stay (HOSPITAL_COMMUNITY)
Admission: EM | Admit: 2015-10-12 | Discharge: 2015-10-13 | Disposition: A | Discharge status: Home / Self Care | Payer: Medicare Other | Attending: Internal Medicine | Admitting: Internal Medicine

## 2015-10-12 DIAGNOSIS — Z86718 Personal history of other venous thrombosis and embolism: Secondary | ICD-10-CM | POA: Diagnosis not present

## 2015-10-12 DIAGNOSIS — M3500 Sicca syndrome, unspecified: Secondary | ICD-10-CM | POA: Diagnosis not present

## 2015-10-12 DIAGNOSIS — Z86711 Personal history of pulmonary embolism: Secondary | ICD-10-CM | POA: Diagnosis not present

## 2015-10-12 DIAGNOSIS — I080 Rheumatic disorders of both mitral and aortic valves: Secondary | ICD-10-CM | POA: Insufficient documentation

## 2015-10-12 DIAGNOSIS — I340 Nonrheumatic mitral (valve) insufficiency: Secondary | ICD-10-CM | POA: Diagnosis not present

## 2015-10-12 DIAGNOSIS — I350 Nonrheumatic aortic (valve) stenosis: Secondary | ICD-10-CM

## 2015-10-12 DIAGNOSIS — F339 Major depressive disorder, recurrent, unspecified: Secondary | ICD-10-CM | POA: Diagnosis present

## 2015-10-12 DIAGNOSIS — M797 Fibromyalgia: Secondary | ICD-10-CM | POA: Diagnosis not present

## 2015-10-12 DIAGNOSIS — Z79899 Other long term (current) drug therapy: Secondary | ICD-10-CM | POA: Insufficient documentation

## 2015-10-12 DIAGNOSIS — E7800 Pure hypercholesterolemia, unspecified: Secondary | ICD-10-CM | POA: Diagnosis not present

## 2015-10-12 DIAGNOSIS — N390 Urinary tract infection, site not specified: Secondary | ICD-10-CM | POA: Diagnosis not present

## 2015-10-12 DIAGNOSIS — Z7982 Long term (current) use of aspirin: Secondary | ICD-10-CM | POA: Diagnosis not present

## 2015-10-12 DIAGNOSIS — Z66 Do not resuscitate: Secondary | ICD-10-CM | POA: Insufficient documentation

## 2015-10-12 DIAGNOSIS — N183 Chronic kidney disease, stage 3 unspecified: Secondary | ICD-10-CM | POA: Diagnosis present

## 2015-10-12 DIAGNOSIS — F329 Major depressive disorder, single episode, unspecified: Secondary | ICD-10-CM | POA: Diagnosis not present

## 2015-10-12 DIAGNOSIS — I5032 Chronic diastolic (congestive) heart failure: Secondary | ICD-10-CM | POA: Diagnosis not present

## 2015-10-12 DIAGNOSIS — R8290 Unspecified abnormal findings in urine: Secondary | ICD-10-CM | POA: Diagnosis not present

## 2015-10-12 DIAGNOSIS — G459 Transient cerebral ischemic attack, unspecified: Secondary | ICD-10-CM | POA: Diagnosis not present

## 2015-10-12 DIAGNOSIS — I130 Hypertensive heart and chronic kidney disease with heart failure and stage 1 through stage 4 chronic kidney disease, or unspecified chronic kidney disease: Secondary | ICD-10-CM | POA: Insufficient documentation

## 2015-10-12 DIAGNOSIS — I639 Cerebral infarction, unspecified: Secondary | ICD-10-CM | POA: Diagnosis not present

## 2015-10-12 DIAGNOSIS — R200 Anesthesia of skin: Secondary | ICD-10-CM | POA: Diagnosis not present

## 2015-10-12 DIAGNOSIS — R2990 Unspecified symptoms and signs involving the nervous system: Secondary | ICD-10-CM | POA: Insufficient documentation

## 2015-10-12 DIAGNOSIS — I5030 Unspecified diastolic (congestive) heart failure: Secondary | ICD-10-CM | POA: Diagnosis present

## 2015-10-12 DIAGNOSIS — I10 Essential (primary) hypertension: Secondary | ICD-10-CM | POA: Diagnosis present

## 2015-10-12 DIAGNOSIS — R4781 Slurred speech: Secondary | ICD-10-CM | POA: Diagnosis not present

## 2015-10-12 LAB — COMPREHENSIVE METABOLIC PANEL
ALK PHOS: 55 U/L (ref 38–126)
ALT: 15 U/L (ref 14–54)
ANION GAP: 5 (ref 5–15)
AST: 27 U/L (ref 15–41)
Albumin: 3.9 g/dL (ref 3.5–5.0)
BILIRUBIN TOTAL: 0.6 mg/dL (ref 0.3–1.2)
BUN: 10 mg/dL (ref 6–20)
CALCIUM: 9.6 mg/dL (ref 8.9–10.3)
CO2: 23 mmol/L (ref 22–32)
CREATININE: 1.09 mg/dL — AB (ref 0.44–1.00)
Chloride: 113 mmol/L — ABNORMAL HIGH (ref 101–111)
GFR calc non Af Amer: 53 mL/min — ABNORMAL LOW (ref >60)
GLUCOSE: 80 mg/dL (ref 65–99)
Potassium: 4 mmol/L (ref 3.5–5.1)
Sodium: 141 mmol/L (ref 135–145)
TOTAL PROTEIN: 7.2 g/dL (ref 6.5–8.1)

## 2015-10-12 LAB — CBC
HCT: 39.4 % (ref 36.0–46.0)
Hemoglobin: 12.7 g/dL (ref 12.0–15.0)
MCH: 26.7 pg (ref 26.0–34.0)
MCHC: 32.2 g/dL (ref 30.0–36.0)
MCV: 82.8 fL (ref 78.0–100.0)
PLATELETS: 203 10*3/uL (ref 150–400)
RBC: 4.76 MIL/uL (ref 3.87–5.11)
RDW: 14.7 % (ref 11.5–15.5)
WBC: 4 10*3/uL (ref 4.0–10.5)

## 2015-10-12 LAB — URINE MICROSCOPIC-ADD ON

## 2015-10-12 LAB — DIFFERENTIAL
Basophils Absolute: 0 10*3/uL (ref 0.0–0.1)
Basophils Relative: 1 %
EOS PCT: 3 %
Eosinophils Absolute: 0.1 10*3/uL (ref 0.0–0.7)
Lymphocytes Relative: 48 %
Lymphs Abs: 1.9 10*3/uL (ref 0.7–4.0)
MONO ABS: 0.3 10*3/uL (ref 0.1–1.0)
MONOS PCT: 9 %
NEUTROS ABS: 1.6 10*3/uL — AB (ref 1.7–7.7)
Neutrophils Relative %: 41 %

## 2015-10-12 LAB — RAPID URINE DRUG SCREEN, HOSP PERFORMED
AMPHETAMINES: NOT DETECTED
Barbiturates: NOT DETECTED
Benzodiazepines: NOT DETECTED
Cocaine: NOT DETECTED
OPIATES: NOT DETECTED
TETRAHYDROCANNABINOL: NOT DETECTED

## 2015-10-12 LAB — URINALYSIS, ROUTINE W REFLEX MICROSCOPIC
BILIRUBIN URINE: NEGATIVE
Glucose, UA: NEGATIVE mg/dL
Hgb urine dipstick: NEGATIVE
KETONES UR: NEGATIVE mg/dL
NITRITE: NEGATIVE
PH: 7 (ref 5.0–8.0)
Protein, ur: NEGATIVE mg/dL
Specific Gravity, Urine: 1.018 (ref 1.005–1.030)

## 2015-10-12 LAB — PROTIME-INR
INR: 0.93
PROTHROMBIN TIME: 12.5 s (ref 11.4–15.2)

## 2015-10-12 LAB — APTT: aPTT: 34 seconds (ref 24–36)

## 2015-10-12 LAB — I-STAT TROPONIN, ED: Troponin i, poc: 0 ng/mL (ref 0.00–0.08)

## 2015-10-12 LAB — ETHANOL

## 2015-10-12 MED ORDER — BIOTENE DRY MOUTH MT LIQD: 15.0000 mL | Freq: Every day | OROMUCOSAL | Status: DC

## 2015-10-12 MED ORDER — AMLODIPINE BESYLATE 10 MG PO TABS
10.0000 mg | ORAL_TABLET | Freq: Every day | ORAL | Status: DC
  Administered 2015-10-13: 10 mg via ORAL
  Filled 2015-10-12: qty 1

## 2015-10-12 MED ORDER — TIZANIDINE HCL 4 MG PO TABS
4.0000 mg | ORAL_TABLET | Freq: Three times a day (TID) | ORAL | Status: DC | PRN
  Filled 2015-10-12: qty 1

## 2015-10-12 MED ORDER — ZOLPIDEM TARTRATE 5 MG PO TABS: 5.0000 mg | ORAL_TABLET | Freq: Every evening | ORAL | Status: DC | PRN

## 2015-10-12 MED ORDER — ENOXAPARIN SODIUM 40 MG/0.4ML ~~LOC~~ SOLN
40.0000 mg | SUBCUTANEOUS | Status: DC
  Administered 2015-10-12: 40 mg via SUBCUTANEOUS
  Filled 2015-10-12: qty 0.4

## 2015-10-12 MED ORDER — ESCITALOPRAM OXALATE 10 MG PO TABS
20.0000 mg | ORAL_TABLET | Freq: Two times a day (BID) | ORAL | Status: DC
  Administered 2015-10-12 – 2015-10-13 (×2): 20 mg via ORAL
  Filled 2015-10-12 (×2): qty 2

## 2015-10-12 MED ORDER — POLYVINYL ALCOHOL 1.4 % OP SOLN
1.0000 [drp] | Freq: Two times a day (BID) | OPHTHALMIC | Status: DC
  Administered 2015-10-12 – 2015-10-13 (×2): 1 [drp] via OPHTHALMIC
  Filled 2015-10-12: qty 15

## 2015-10-12 MED ORDER — ASPIRIN EC 325 MG PO TBEC
325.0000 mg | DELAYED_RELEASE_TABLET | Freq: Every day | ORAL | Status: DC
  Administered 2015-10-13: 325 mg via ORAL
  Filled 2015-10-12: qty 1

## 2015-10-12 MED ORDER — HYDROXYCHLOROQUINE SULFATE 200 MG PO TABS
200.0000 mg | ORAL_TABLET | Freq: Every day | ORAL | Status: DC
  Administered 2015-10-13: 200 mg via ORAL
  Filled 2015-10-12: qty 1

## 2015-10-12 MED ORDER — EZETIMIBE 10 MG PO TABS
10.0000 mg | ORAL_TABLET | Freq: Every day | ORAL | Status: DC
  Administered 2015-10-12: 10 mg via ORAL
  Filled 2015-10-12: qty 1

## 2015-10-12 MED ORDER — GABAPENTIN 300 MG PO CAPS
300.0000 mg | ORAL_CAPSULE | Freq: Three times a day (TID) | ORAL | Status: DC
  Administered 2015-10-12 – 2015-10-13 (×3): 300 mg via ORAL
  Filled 2015-10-12 (×3): qty 1

## 2015-10-12 MED ORDER — ACETAMINOPHEN 325 MG PO TABS
650.0000 mg | ORAL_TABLET | ORAL | Status: DC | PRN
  Administered 2015-10-12 – 2015-10-13 (×2): 650 mg via ORAL
  Filled 2015-10-12 (×3): qty 2

## 2015-10-12 MED ORDER — FAMOTIDINE 20 MG PO TABS
20.0000 mg | ORAL_TABLET | Freq: Two times a day (BID) | ORAL | Status: DC
  Administered 2015-10-12 – 2015-10-13 (×2): 20 mg via ORAL
  Filled 2015-10-12 (×2): qty 1

## 2015-10-12 MED ORDER — VITAMIN D 1000 UNITS PO TABS
1000.0000 [IU] | ORAL_TABLET | Freq: Every day | ORAL | Status: DC
  Administered 2015-10-13: 1000 [IU] via ORAL
  Filled 2015-10-12: qty 1

## 2015-10-12 MED ORDER — POLYETHYL GLYCOL-PROPYL GLYCOL 0.4-0.3 % OP GEL
Freq: Two times a day (BID) | OPHTHALMIC | Status: DC
  Filled 2015-10-12: qty 10

## 2015-10-12 MED ORDER — ALPRAZOLAM 0.5 MG PO TABS
1.0000 mg | ORAL_TABLET | Freq: Three times a day (TID) | ORAL | Status: DC
  Administered 2015-10-12 – 2015-10-13 (×3): 1 mg via ORAL
  Filled 2015-10-12 (×3): qty 2

## 2015-10-12 MED ORDER — STROKE: EARLY STAGES OF RECOVERY BOOK: Freq: Once | Status: DC

## 2015-10-12 MED ORDER — METOPROLOL SUCCINATE ER 25 MG PO TB24
50.0000 mg | ORAL_TABLET | Freq: Every day | ORAL | Status: DC
  Administered 2015-10-12: 50 mg via ORAL
  Filled 2015-10-12: qty 2

## 2015-10-12 MED ORDER — METOPROLOL SUCCINATE ER 25 MG PO TB24
25.0000 mg | ORAL_TABLET | Freq: Every day | ORAL | Status: DC
  Administered 2015-10-13: 25 mg via ORAL
  Filled 2015-10-12: qty 1

## 2015-10-12 MED ORDER — CYCLOSPORINE 0.05 % OP EMUL
1.0000 [drp] | Freq: Two times a day (BID) | OPHTHALMIC | Status: DC
  Administered 2015-10-12 – 2015-10-13 (×2): 1 [drp] via OPHTHALMIC
  Filled 2015-10-12 (×2): qty 1

## 2015-10-12 NOTE — Telephone Encounter (Signed)
Per chart review pt is admitted at [facility]

## 2015-10-12 NOTE — Consult Note (Signed)
Initial Neurological Consultation                      NEURO HOSPITALIST CONSULT NOTE   Requestig physician: Dr. Waldron Labs   Reason for Consult:  Transient sensation of numbness involving right face and right upper extremity with some mild word finding difficulties.   HPI:                                                                                                                                          Alexandria Leach is an 65 y.o. female with a past medical history including hypertension and hyperlipidemia among others as well as migraine headaches, presents with a history of transient symptoms of numbness in the right side of the face and the right upper extremity.  This sensation has resolved at this point. Alexandria Leach reports that she has experienced sensations of this nature several times in the past. Often these occur in association with headaches. The symptoms tend to be transient lasting 15-30 minutes. Alexandria Leach reports that the last event occurred several weeks ago. She notes this event was a bit more prolonged and caused her some concern.  A CT of the brain was performed revealing nonspecific white matter change. There is no evidence of hemorrhage mass effect or other lesions. Alexandria Leach otherwise reports no focal neurological complaints she's describing no cranial nerve symptoms and she reports no focal weakness she's describing no difficulties with her coordination or balance.      Past Medical History:  Diagnosis Date  . Ankle fracture, left 02/23/2015  . Anxiety   . CHF (congestive heart failure) ([place])   . CKD (chronic kidney disease) stage 3, GFR 30-59 ml/min    saw nephrologist Dr Harden Mo  . DDD (degenerative disc disease), cervical   . Depression   . Fibromyalgia   . Glaucoma    s/p surgery, sees ophtho Q6 mo  . 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 History of pulmonary embolism 2001, 2006   completed  coumadin courses  . History of rheumatic fever x3  . HLD (hyperlipidemia)   . HTN (hypertension)   . Insomnia   . Lung nodule 09/22/2012   RLL - 69mm, stable since 2014. Thought benign.   . Osteoarthritis    shoulders and knees, not RA per Dr Estanislado Pandy, positive ANA, positive Ro  . Osteopenia 09/18/2015   DEXA T -1.1 hip, -0.2 spine 08/2015   . Personal history of urinary calculi latest 2014  . PONV (postoperative nausea and vomiting)   . Refusal of blood transfusions as patient is Jehovah's Witness   . Rheumatic heart disease 1980   s/p mitral valve repair 1980  . Sjogren's syndrome [facility])     Past Surgical History:  Procedure Laterality Date  . BREAST BIOPSY Right 2006   benign  .  CHOLECYSTECTOMY  11/27/2011   Procedure: LAPAROSCOPIC CHOLECYSTECTOMY WITH INTRAOPERATIVE CHOLANGIOGRAM;  Surgeon: Adin Hector, MD;  Location: [place];  Service: General;  Laterality: N/A;  laparoscopic cholecystectomy with choleangiogram umbilical hernia repair  . COLONOSCOPY  07/2014   WNL Amedeo Plenty)  . dexa  08/2012   normal per patient - no records available  . MITRAL VALVE REPAIR  1980   open heart  . ORIF ANKLE FRACTURE Left 02/26/2015   Procedure: OPEN REDUCTION INTERNAL FIXATION (ORIF) LEFT TRIMALLEOLAR ANKLE FRACTURE;  Surgeon: [name], MD;  Location: [place];  Service: Orthopedics;  Laterality: Left;  . TUBAL LIGATION  1980  . UMBILICAL HERNIA REPAIR  11/27/2011   Procedure: HERNIA REPAIR UMBILICAL ADULT;  Surgeon: Adin Hector, MD;  Location: [place];  Service: General;  Laterality: N/A;  . [place]   for fibroids -- partial, ovaries remain    MEDICATIONS:                                                                                                                     I have reviewed the patient's current medications.  Allergies  Allergen Reactions  . Cymbalta [Duloxetine Hcl] Other (See Comments)    tachycardia  . Statins Nausea Only and Other (See Comments)     Muscle cramps also  . Sulfa Antibiotics Nausea And Vomiting     Social History:  reports that she has never smoked. She has never used smokeless tobacco. She reports that she does not drink alcohol or use drugs.  Family History  Problem Relation Age of Onset  . Lupus Sister     and niece  . Cancer Mother     lung (nonsmoker)  . CAD Mother     MI in her 68s  . ALS Mother   . Kidney disease Father   . Alcohol abuse Father   . Diabetes Father   . Cancer Brother     bone  . Diabetes Sister   . Stroke Sister   . Cancer Maternal Uncle     bone  . Depression Sister   . Kidney failure Other     on HD  . Diabetes Brother   . Stroke Maternal Grandmother      ROS:  History obtained from chart review  General ROS: negative for - chills, fatigue, fever, night sweats, weight gain or weight loss Psychological ROS: negative for - behavioral disorder, hallucinations, memory difficulties, mood swings or suicidal ideation Ophthalmic ROS: negative for - blurry vision, double vision, eye pain or loss of vision ENT ROS: negative for - epistaxis, nasal discharge, oral lesions, sore throat, tinnitus or vertigo Allergy and Immunology ROS: negative for - hives or itchy/watery eyes Hematological and Lymphatic ROS: negative for - bleeding problems, bruising or swollen lymph nodes Endocrine ROS: negative for - galactorrhea, hair pattern changes, polydipsia/polyuria or temperature intolerance Respiratory ROS: negative for - cough, hemoptysis, shortness of breath or wheezing Cardiovascular ROS: negative for - chest pain, dyspnea on exertion, edema or irregular heartbeat Gastrointestinal ROS: negative for - abdominal pain, diarrhea, hematemesis, nausea/vomiting or stool incontinence Genito-Urinary ROS: negative for - dysuria, hematuria, incontinence or urinary  frequency/urgency Musculoskeletal ROS: negative for - joint swelling or muscular weakness Neurological ROS: as noted in HPI Dermatological ROS: negative for rash and skin lesion changes   General Exam                                                                                                      Blood pressure 146/82, pulse 70, temperature 98.3 F (36.8 C), resp. rate 12, SpO2 100 %. HEENT-  Normocephalic, no lesions, without obvious abnormality.  Normal external eye and conjunctiva.  Normal TM's bilaterally.  Normal auditory canals and external ears. Normal external nose, mucus membranes and septum.  Normal pharynx. Cardiovascular- regular rate and rhythm, S1, S2 normal, no murmur, click, rub or gallop, pulses palpable throughout   Lungs- chest clear, no wheezing, rales, normal symmetric air entry, Heart exam - S1, S2 normal, no murmur, no gallop, rate regular Abdomen- soft, non-tender; bowel sounds normal; no masses,  no organomegaly Extremities- less then 2 second capillary refill Lymph-no adenopathy palpable Musculoskeletal-no joint tenderness, deformity or swelling Skin-warm and dry, no hyperpigmentation, vitiligo, or suspicious lesions  Neurological Examination Mental Status: Alert, oriented, thought content appropriate.  Speech fluent without evidence of aphasia.  Able to follow 3 step commands without difficulty. Cranial Nerves: II: Discs flat bilaterally; Visual fields grossly normal, pupils equal, round, reactive to light and accommodation III,IV, VI: ptosis not present, extra-ocular motions intact bilaterally V,VII: smile symmetric, facial light touch sensation normal bilaterally VIII: hearing normal bilaterally IX,X: uvula rises symmetrically XI: bilateral shoulder shrug XII: midline tongue extension Motor: Right : Upper extremity   5/5    Left:     Upper extremity   5/5  Lower extremity   5/5     Lower extremity   5/5 Tone and bulk:normal tone throughout; no  atrophy noted Sensory: Pinprick and light touch intact throughout, bilaterally Deep Tendon Reflexes: 2+ and symmetric throughout Plantars: Right: downgoing   Left: downgoing Cerebellar: normal finger-to-nose, normal rapid alternating movements and normal heel-to-shin test Gait: normal gait and station    Lab Results: Basic Metabolic Panel:  Recent Labs Lab 10/12/15 1000  NA 141  K 4.0  CL 113*  CO2 23  GLUCOSE 80  BUN 10  CREATININE 1.09*  CALCIUM 9.6    Liver Function Tests:  Recent Labs Lab 10/12/15 1000  AST 27  ALT 15  ALKPHOS 55  BILITOT 0.6  PROT 7.2  ALBUMIN 3.9   No results for input(s): LIPASE, AMYLASE in the last 168 hours. No results for input(s): AMMONIA in the last 168 hours.  CBC:  Recent Labs Lab 10/12/15 1000  WBC 4.0  NEUTROABS 1.6*  HGB 12.7  HCT 39.4  MCV 82.8  PLT 203    Cardiac Enzymes: No results for input(s): CKTOTAL, CKMB, CKMBINDEX, TROPONINI in the last 168 hours.  Lipid Panel: No results for input(s): CHOL, TRIG, HDL, CHOLHDL, VLDL, LDLCALC in the last 168 hours.  CBG: No results for input(s): GLUCAP in the last 168 hours.  Microbiology: Results for orders placed or performed during the hospital encounter of 02/23/15  MRSA PCR Screening     Status: None   Collection Time: 02/25/15  9:20 PM  Result Value Ref Range Status   MRSA by PCR NEGATIVE NEGATIVE Final    Comment:        The GeneXpert MRSA Assay (FDA approved for NASAL specimens only), is one component of a comprehensive MRSA colonization surveillance program. It is not intended to diagnose MRSA infection nor to guide or monitor treatment for MRSA infections.     Coagulation Studies:  Recent Labs  10/12/15 1000  LABPROT 12.5  INR 0.93    Imaging: Ct Head Wo Contrast  Result Date: 10/12/2015 CLINICAL DATA:  Several recent episodes of right upper extremity numbness. Transient right-sided facial numbness and slurring of speech 1 day prior. EXAM:  CT HEAD WITHOUT CONTRAST TECHNIQUE: Contiguous axial images were obtained from the base of the skull through the vertex without intravenous contrast. COMPARISON:  March 11, 2015 FINDINGS: Brain: The ventricles are normal in size and configuration. There is no intracranial mass, hemorrhage, extra-axial fluid collection, or midline shift. There is mild small vessel disease in the centra semiovale bilaterally. Elsewhere gray-white compartments appear normal. No acute infarct is evident. Vascular: No hyperdense vessels are noted. There are foci of calcification in each carotid siphon. Skull: The bony calvarium appears intact. Sinuses/Orbits: Orbits appear symmetric bilaterally. Visualized paranasal sinuses are clear. Other: Mastoid air cells are clear. IMPRESSION: Slight periventricular small vessel disease. No intracranial mass, hemorrhage, or evidence suggesting acute infarct. There are scattered foci of vascular calcification in both carotid siphon regions. Electronically Signed   By: Lowella Grip III M.D.   On: 10/12/2015 11:03   Assessment/Plan:  Alexandria Leach is a pleasant 65 year old patient she presents today with a history of a number of episodes of transient numbness of the onset of face and the entire right upper extremity lacking a clear dermatomal or radicular distribution. Her symptoms have presently resolved and her neurologic exam is normal her CT of the brain reveals nonspecific white matter change.  Alexandria Leach has undergone laboratory testing as well as a urinalysis. Urinalysis reveals evidence of a urinary tract infection. It is unknown to what extent the urinary tract infection may have contributed to her current symptoms.  Alexandria Leach otherwise reports no focal neurological complaints and her symptoms have resolved at this point. Given the specific details of this presentation the differential diagnosis would include conditions such as migraine variants which might help explain the recurrent nature  of the symptoms and the lack of any permanent neurological deficit. Consideration could be given to additional neuroimaging such as MRI to help further rule  out the possibility of ischemic change.  Plan:  1. Agree with treatment of urinary tract infection  2. MRI of the brain would be reasonable.  3. If these events continue to occur it might be reasonable to start a prophylactic medication for migraine.  Thank you for consulting the neurology service to assist in the care of your patient!    Alexandria Leach A. Tasia Catchings, M.D. Neurohospitalist Phone: [phone]  10/12/2015, 12:46 PM

## 2015-10-12 NOTE — ED Notes (Signed)
Pt able to ambulate w/ steady gait to restroom.

## 2015-10-12 NOTE — H&P (Signed)
History and Physical    Alexandria Leach [id number] DOB: September 22, 1950 DOA: 10/12/2015   PCP: Ria Bush, MD   Patient coming from/Resides with: Private residence/lives with husband  Chief Complaint: Transient right arm and facial numbness with facial drooping  HPI: Alexandria Leach is a 65 y.o. female with medical history significant for hypertension and chronic diastolic dysfunction, dyslipidemia, Sjogren's syndrome fibromyalgia, prior PE, aortic stenosis and CK D stage III presents to the hospital with recurrent transient right arm and facial numbness. Patient has had 3 episodes. The first was at the early part of July with focal right arm numbness and tingling without loss of strength duration 5 seconds; the second episode was this past Saturday where the entire right arm became totally numb (again without loss of motor function/change in strength) duration 5 minutes; the third episode happened yesterday with the entire arm once again becoming numb as before with tingling extending into the face with numbness and right facial drooping with duration about 10 minutes. She notified her PCP this morning who instructed her to present to the ER for evaluation. She's had no symptoms today. Not started any new medications. She is complaining of a mild headache currently.  ED Course:  Vital signs: 98.3-166/80 3-72-20-100% on RA CT head without contrast: Slight periventricular small vessel disease otherwise no acute findings; scattered foci of vascular calcification in both carotid siphon regions Lab data: Sodium 141, potassium 4.0, BUN 10, creatinine 1.09, LFTs within normal limits, poc TNI 0.00, WBCs 4000 with normal differential, hemoglobin 12.7, platelets 203,000, PT/PTT/INR within normal limits, urinalysis with cloudy appearance, rare bacteria, large leukocytes with 6-30 squamous epithelials and 6-30 WBCs, alcohol level less than 5, urine drug screen negative Medications and treatments:  None  Review of Systems:  In addition to the HPI above,  No Fever-chills, myalgias or other constitutional symptoms No Headache, changes with Vision or hearing, new weakness No problems swallowing food or Liquids, indigestion/reflux No Chest pain, Cough or Shortness of Breath, palpitations, orthopnea or DOE No Abdominal pain, N/V; no melena or hematochezia, no dark tarry stools, Bowel movements are regular, No dysuria, hematuria or flank pain No new skin rashes, lesions, masses or bruises, No new joints pains-aches No recent weight gain or loss No polyuria, polydypsia or polyphagia,   Past Medical History:  Diagnosis Date  . Ankle fracture, left 02/23/2015  . Anxiety   . CHF (congestive heart failure) ([place])   . CKD (chronic kidney disease) stage 3, GFR 30-59 ml/min    saw nephrologist Dr Harden Mo  . DDD (degenerative disc disease), cervical   . Depression   . Fibromyalgia   . Glaucoma    s/p surgery, sees ophtho Q6 mo  . 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 History of pulmonary embolism 2001, 2006   completed coumadin courses  . History of rheumatic fever x3  . HLD (hyperlipidemia)   . HTN (hypertension)   . Insomnia   . Lung nodule 09/22/2012   RLL - 51mm, stable since 2014. Thought benign.   . Osteoarthritis    shoulders and knees, not RA per Dr Estanislado Pandy, positive ANA, positive Ro  . Osteopenia 09/18/2015   DEXA T -1.1 hip, -0.2 spine 08/2015   . Personal history of urinary calculi latest 2014  . PONV (postoperative nausea and vomiting)   . Refusal of blood transfusions as patient is Jehovah's Witness   . Rheumatic heart disease 1980  s/p mitral valve repair 1980  . Sjogren's syndrome [facility])     Past Surgical History:  Procedure Laterality Date  . BREAST BIOPSY Right 2006   benign  . CHOLECYSTECTOMY  11/27/2011   Procedure: LAPAROSCOPIC CHOLECYSTECTOMY WITH INTRAOPERATIVE CHOLANGIOGRAM;  Surgeon:  Adin Hector, MD;  Location: [place];  Service: General;  Laterality: N/A;  laparoscopic cholecystectomy with choleangiogram umbilical hernia repair  . COLONOSCOPY  07/2014   WNL Amedeo Plenty)  . dexa  08/2012   normal per patient - no records available  . MITRAL VALVE REPAIR  1980   open heart  . ORIF ANKLE FRACTURE Left 02/26/2015   Procedure: OPEN REDUCTION INTERNAL FIXATION (ORIF) LEFT TRIMALLEOLAR ANKLE FRACTURE;  Surgeon: [name], MD;  Location: [place];  Service: Orthopedics;  Laterality: Left;  . TUBAL LIGATION  1980  . UMBILICAL HERNIA REPAIR  11/27/2011   Procedure: HERNIA REPAIR UMBILICAL ADULT;  Surgeon: Adin Hector, MD;  Location: [place];  Service: General;  Laterality: N/A;  . [place]   for fibroids -- partial, ovaries remain    Social History   Social History  . Marital status: Married    Spouse name: [name]  . Number of children: 4  . Years of education: 12   Occupational History  .  Unemployed    Disability   Social History Main Topics  . Smoking status: Never Smoker  . Smokeless tobacco: Never Used  . Alcohol use No  . Drug use: No  . Sexual activity: Not Currently    Birth control/ protection: Surgical   Other Topics Concern  . Not on file   Social History Narrative   Lives with son, 1 dog   Occupation: unemployed, on disability for fibromyalgia since 2008.   Edu: HS   Religion: Jehova's witness   Activity: volunteers at senior center   Diet: some water, fruits/vegetables daily   No caffeine use    Mobility: Typically without assistive devices although he does utilize a cane when her left ankle hurts (prior surgery) Work history: Currently not working   Allergies  Allergen Reactions  . Cymbalta [Duloxetine Hcl] Other (See Comments)    tachycardia  . Statins Nausea Only and Other (See Comments)    Muscle cramps also  . Sulfa Antibiotics Nausea And Vomiting    Family History  Problem Relation Age of Onset  . Lupus  Sister     and niece  . Cancer Mother     lung (nonsmoker)  . CAD Mother     MI in her 8s  . ALS Mother   . Kidney disease Father   . Alcohol abuse Father   . Diabetes Father   . Cancer Brother     bone  . Diabetes Sister   . Stroke Sister   . Cancer Maternal Uncle     bone  . Depression Sister   . Kidney failure Other     on HD  . Diabetes Brother   . Stroke Maternal Grandmother    Family history reviewed and Patient confirms significant family history of stroke occluding 2 sisters and brother and her grandmother  Prior to Admission medications   Medication Sig Start Date End Date Taking? Authorizing Provider  ALPRAZolam Duanne Moron) 1 MG tablet Take 1 tablet (1 mg total) by mouth 3 (three) times daily. 08/18/15 08/17/16 Yes Cloria Spring, MD  amLODipine (NORVASC) 10 MG tablet Take 1 tablet by mouth  daily 01/25/15  Yes Ria Bush,  MD  antiseptic oral rinse (BIOTENE) LIQD 15 mLs by Mouth Rinse route at bedtime.    Yes Historical Provider, MD  aspirin 325 MG EC tablet Take 325 mg by mouth daily.    Yes Historical Provider, MD  cholecalciferol (VITAMIN D) 1000 UNITS tablet Take 1,000 Units by mouth daily.    Yes Historical Provider, MD  cycloSPORINE (RESTASIS) 0.05 % ophthalmic emulsion Place 1 drop into both eyes 2 (two) times daily. 07/26/14  Yes Ria Bush, MD  diclofenac sodium (VOLTAREN) 1 % GEL Apply 1 application topically 2 (two) times daily as needed (pain).   Yes Historical Provider, MD  escitalopram (LEXAPRO) 20 MG tablet Take 1 tablet (20 mg total) by mouth 2 (two) times daily. 08/18/15 08/17/16 Yes Cloria Spring, MD  ezetimibe (ZETIA) 10 MG tablet Take 1 tablet (10 mg total) by mouth at bedtime. 09/18/15  Yes Ria Bush, MD  famotidine (PEPCID) 20 MG tablet Take 1 tablet by mouth two  times daily 03/06/15  Yes Ria Bush, MD  furosemide (LASIX) 20 MG tablet TAKE 1 TABLET BY MOUTH  DAILY AS NEEDED FOR EDEMA. Patient taking differently: TAKE 1 TABLET BY MOUTH   DAILY AS NEEDED FOR WEIGHT GAIN OF 3 LBS IN 24 HOURS 01/25/15  Yes Ria Bush, MD  gabapentin (NEURONTIN) 300 MG capsule Take 1 capsule by mouth 3  times a day 06/08/15  Yes Ria Bush, MD  hydroxychloroquine (PLAQUENIL) 200 MG tablet Take 200 mg by mouth daily.  11/05/14  Yes Ria Bush, MD  metoprolol succinate (TOPROL-XL) 50 MG 24 hr tablet TAKE 1/2 TABLET IN THE  MORNING AND 1 TABLET AT  NIGHT TIME 06/08/15  Yes Ria Bush, MD  pilocarpine (SALAGEN) 5 MG tablet Take 2.5-5 mg by mouth 2 (two) times daily as needed. Take 1 tablet (5 mg) by mouth every morning and 1/2 tablet (2.5 mg) at 2-2:30pm if needed for Sjogren syndrome   Yes Historical Provider, MD  Polyethyl Glycol-Propyl Glycol (SYSTANE) 0.4-0.3 % GEL Place 1 drop into both eyes 2 (two) times daily.    Yes Historical Provider, MD  tiZANidine (ZANAFLEX) 4 MG tablet Take 1 tablet by mouth  every 8 hours as needed  muscle spasms 12/07/14  Yes Ria Bush, MD  zolpidem (AMBIEN CR) 12.5 MG CR tablet Take 1 tablet (12.5 mg total) by mouth at bedtime as needed for sleep. 02/03/15 02/03/16 Yes Cloria Spring, MD  amoxicillin (AMOXIL) 500 MG capsule Take 4 capsules (2000 mg) by mouth one hour prior to dental appointment. Patient not taking: Reported on 10/12/2015 09/18/15   Ria Bush, MD  ibuprofen (ADVIL,MOTRIN) 800 MG tablet Take 1 tablet (800 mg total) by mouth every 8 (eight) hours as needed for moderate pain. Patient not taking: Reported on 10/12/2015 06/26/15   Zenovia Jarred, DO  traMADol (ULTRAM) 50 MG tablet Take 1 tablet (50 mg total) by mouth every 12 (twelve) hours as needed for severe pain. Patient not taking: Reported on 10/12/2015 09/08/14   Ria Bush, MD    Physical Exam: Vitals:   10/12/15 0948 10/12/15 1001 10/12/15 1015  BP: 166/83  146/82  Pulse: 72  70  Resp:   12  Temp: 98.3 F (36.8 C) 98.3 F (36.8 C)   TempSrc: Oral    SpO2: 100%  100%      Constitutional: NAD, calm,  comfortable Eyes: PERRL, lids and conjunctivae normal ENMT: Mucous membranes are moist. Posterior pharynx clear of any exudate or lesions.Normal dentition.  Neck: normal,  supple, no masses, no thyromegaly Respiratory: clear to auscultation bilaterally, no wheezing, no crackles. Normal respiratory effort. No accessory muscle use.  Cardiovascular: Regular rate and rhythm, 1/6 systolic murmur-no rubs / gallops. No extremity edema. 2+ pedal pulses. No carotid bruits.  Abdomen: no tenderness, no masses palpated. No hepatosplenomegaly. Bowel sounds positive.  Musculoskeletal: no clubbing / cyanosis. No joint deformity upper and lower extremities. Good ROM, no contractures. Normal muscle tone.  Skin: no rashes, lesions, ulcers. No induration Neurologic: CN 2-12 grossly intact. Sensation intact, DTR normal. Strength 5/5 x all 4 extremities although with testing patient had difficulty lifting right leg secondary to pain and hip from known bursitis Psychiatric: Normal judgment and insight. Alert and oriented x 3. Normal mood.    Labs on Admission: I have personally reviewed following labs and imaging studies  CBC:  Recent Labs Lab 10/12/15 1000  WBC 4.0  NEUTROABS 1.6*  HGB 12.7  HCT 39.4  MCV 82.8  PLT 123456   Basic Metabolic Panel:  Recent Labs Lab 10/12/15 1000  NA 141  K 4.0  CL 113*  CO2 23  GLUCOSE 80  BUN 10  CREATININE 1.09*  CALCIUM 9.6   GFR: CrCl cannot be calculated (Unknown ideal weight.). Liver Function Tests:  Recent Labs Lab 10/12/15 1000  AST 27  ALT 15  ALKPHOS 55  BILITOT 0.6  PROT 7.2  ALBUMIN 3.9   No results for input(s): LIPASE, AMYLASE in the last 168 hours. No results for input(s): AMMONIA in the last 168 hours. Coagulation Profile:  Recent Labs Lab 10/12/15 1000  INR 0.93   Cardiac Enzymes: No results for input(s): CKTOTAL, CKMB, CKMBINDEX, TROPONINI in the last 168 hours. BNP (last 3 results) No results for input(s): PROBNP in the last  8760 hours. HbA1C: No results for input(s): HGBA1C in the last 72 hours. CBG: No results for input(s): GLUCAP in the last 168 hours. Lipid Profile: No results for input(s): CHOL, HDL, LDLCALC, TRIG, CHOLHDL, LDLDIRECT in the last 72 hours. Thyroid Function Tests: No results for input(s): TSH, T4TOTAL, FREET4, T3FREE, THYROIDAB in the last 72 hours. Anemia Panel: No results for input(s): VITAMINB12, FOLATE, FERRITIN, TIBC, IRON, RETICCTPCT in the last 72 hours. Urine analysis:    Component Value Date/Time   COLORURINE YELLOW 10/12/2015 1000   APPEARANCEUR CLOUDY (A) 10/12/2015 1000   LABSPEC 1.018 10/12/2015 1000   PHURINE 7.0 10/12/2015 1000   GLUCOSEU NEGATIVE 10/12/2015 1000   HGBUR NEGATIVE 10/12/2015 1000   BILIRUBINUR NEGATIVE 10/12/2015 1000   KETONESUR NEGATIVE 10/12/2015 1000   PROTEINUR NEGATIVE 10/12/2015 1000   UROBILINOGEN 0.2 06/03/2014 1947   NITRITE NEGATIVE 10/12/2015 1000   LEUKOCYTESUR LARGE (A) 10/12/2015 1000   Sepsis Labs: @LABRCNTIP (procalcitonin:4,lacticidven:4) )No results found for this or any previous visit (from the past 240 hour(s)).   Radiological Exams on Admission: Ct Head Wo Contrast  Result Date: 10/12/2015 CLINICAL DATA:  Several recent episodes of right upper extremity numbness. Transient right-sided facial numbness and slurring of speech 1 day prior. EXAM: CT HEAD WITHOUT CONTRAST TECHNIQUE: Contiguous axial images were obtained from the base of the skull through the vertex without intravenous contrast. COMPARISON:  March 11, 2015 FINDINGS: Brain: The ventricles are normal in size and configuration. There is no intracranial mass, hemorrhage, extra-axial fluid collection, or midline shift. There is mild small vessel disease in the centra semiovale bilaterally. Elsewhere gray-white compartments appear normal. No acute infarct is evident. Vascular: No hyperdense vessels are noted. There are foci of calcification in each carotid  siphon. Skull: The  bony calvarium appears intact. Sinuses/Orbits: Orbits appear symmetric bilaterally. Visualized paranasal sinuses are clear. Other: Mastoid air cells are clear. IMPRESSION: Slight periventricular small vessel disease. No intracranial mass, hemorrhage, or evidence suggesting acute infarct. There are scattered foci of vascular calcification in both carotid siphon regions. Electronically Signed   By: Lowella Grip III M.D.   On: 10/12/2015 11:03   EKG: (Independently reviewed) Sinus rhythm with ventricular rate 71 bpm, QTC 453 ms, elevated J point in inferior lateral leads and no ST segment or T-wave changes that would be concerning for ischemia  Assessment/Plan Principal Problem:   TIA (transient ischemic attack) -Patient with progressively more severe transient episodes of right upper extremity and facial numbness with tingling currently symptom free -Formal neurology evaluation in process -Initiate ischemic stroke/TIA order set: -MRI/MRA brain -Echocardiogram -On aspirin prior to admission and pending neurology recommendations may need to add antiplatelet agent such as Plavix but defer to neurology -Hemoglobin A1c and lipid panel for risk factor stratification -Was on Zetia prior transmission but not on a statin -Currently no speech/ language deficits and no reported issues with dysphagia so no indication for SLP evaluation -Currently without any numbness or weakness so no indication at this juncture for PT/OT evaluation  Active Problems:   Abnormal urinalysis -Patient asymptomatic -Urine culture -Antibiotics only if culture positive for bacterial growth greater than 100,000 colonies    HYPERCHOLESTEROLEMIA -On Zetia prior to admission -Follow up on lipid panel    Essential hypertension/grade 2 Diastolic CHF -Current blood pressure well controlled on preadmission medications of Norvasc and Toprol -Echocardiogram August 2016 with preserved LV function with grade 2 diastolic  dysfunction features -Has prn Lasix available based on weight gain    Sjogren's syndrome  -Continue preadmission Plaquenil, Restasis and oral Biotene    CKD (chronic kidney disease) stage 3, GFR 30-59 ml/min -Current GFR greater than 64 does not meet parameters for CK D stage III    MDD (major depressive disorder), recurrent episode  -Continue preadmission Xanax, and Lexapro    Aortic stenosis (mild)/mild-to-moderate mitral regurgitation -Asymptomatic -Follow up on echocardiogram this admission    Fibromyalgia -Continue preadmission gabapentin, Ultram, Zanaflex, Lexapro    History of pulmonary embolism -Not on anticoagulation prior to admission      DVT prophylaxis: Lovenox  Code Status: DO NOT RESUSCITATE Family Communication: Family friend at bedside with patient permission Disposition Plan: Anticipate discharge back to preadmission home environment once medically stable Consults called: Neurology/ [place] Admission status: Observation/telemetry     ELLIS,ALLISON L. ANP-BC [facility] Pager [phone]   If 7PM-7AM, please contact night-coverage [email or web address] Password [facility]  10/12/2015, 12:28 PM

## 2015-10-12 NOTE — ED Triage Notes (Signed)
Pt reports episode of right-sided arm numbness once at beginning of July, once Saturday, and yesterday. Reports having right-sided facial numbness and slurring of speech yesterday for 10 minutes. Right arm "heaviness" has been ongoing for 1 month. Reports having intermittent "slight headache" x 1 month. Also c/o chronic neck pain, no new changes w/ this pain. Hx CHF, fibromyalgia, HTN, hyperlipidemia.

## 2015-10-12 NOTE — ED Provider Notes (Signed)
[place] [place] DEPT Provider Note   CSN: [id number] Arrival date & time: 10/12/15  [id number]  First Provider Contact:  First MD Initiated Contact with Patient 10/12/15 [phone]        History   Chief Complaint Chief Complaint  Patient presents with  . Numbness    HPI Alexandria Leach is a 65 y.o. female presents for evaluation of right-sided numbness. States that last night she had acute onset of right facial numbness, right facial droop, slurred speech, and right arm/hand numbness. Went away after about 15 minutes. Denies current symptoms. Earlier in the month, 3 weeks ago, she had similar transient symptoms. Has been having a mild to moderate headache every day for about a month. Chronic neck pain, not worse than typical. No leg symptoms. No chest pain or back pain or abdominal pain. Has seen a neurologist in the past for evaluation of falls, had a workup for MS but this was negative.  HPI  Past Medical History:  Diagnosis Date  . Ankle fracture, left 02/23/2015  . Anxiety   . CHF (congestive heart failure) ([place])   . CKD (chronic kidney disease) stage 3, GFR 30-59 ml/min    saw nephrologist Dr Harden Mo  . DDD (degenerative disc disease), cervical   . Depression   . Fibromyalgia   . Glaucoma    s/p surgery, sees ophtho Q6 mo  . 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 History of pulmonary embolism 2001, 2006   completed coumadin courses  . History of rheumatic fever x3  . HLD (hyperlipidemia)   . HTN (hypertension)   . Insomnia   . Lung nodule 09/22/2012   RLL - 58mm, stable since 2014. Thought benign.   . Osteoarthritis    shoulders and knees, not RA per Dr Estanislado Pandy, positive ANA, positive Ro  . Osteopenia 09/18/2015   DEXA T -1.1 hip, -0.2 spine 08/2015   . Personal history of urinary calculi latest 2014  . PONV (postoperative nausea and vomiting)   . Refusal of blood transfusions as patient is Jehovah's Witness     . Rheumatic heart disease 1980   s/p mitral valve repair 1980  . Sjogren's syndrome [facility])     Patient Active Problem List   Diagnosis Date Noted  . TIA (transient ischemic attack) 10/12/2015  . Osteopenia 09/18/2015  . Right arm pain 08/29/2015  . Fall   . Trimalleolar fracture of left ankle 02/23/2015  . Right sided sciatica 02/21/2015  . Imbalance 01/12/2015  . Transaminitis 12/24/2014  . DDD (degenerative disc disease), cervical   . Health maintenance examination 10/18/2014  . Advanced care planning/counseling discussion 10/18/2014  . Medicare annual wellness visit, initial 10/18/2014  . Refusal of blood transfusions as patient is Jehovah's Witness   . Sjogren's syndrome ([place])   . CKD (chronic kidney disease) stage 3, GFR 30-59 ml/min   . Glaucoma   . Fibromyalgia   . Osteoarthritis   . History of pulmonary embolism   . Lung nodule 09/22/2012  . Diastolic CHF ([place]) [id number]  . Pneumonia 12/02/2011  . Aortic stenosis 04/22/2011  . Palpitations 08/10/2010  . Dyspnea 08/10/2010  . HOT FLASHES 06/28/2008  . Trochanteric bursitis of right hip 06/28/2008  . ARTHRALGIA 07/01/2007  . BACK PAIN, LEFT 08/25/2006  . GAD (generalized anxiety disorder) 03/28/2006  . MITRAL VALVE REPLACEMENT, HX OF 03/28/2006  . TAH/BSO, HX OF 03/28/2006  . HYPERCHOLESTEROLEMIA 12/31/2005  . MDD (major depressive disorder),  recurrent episode ([place]) 12/31/2005  . RHEUMATIC HEART DISEASE 12/31/2005  . Essential hypertension 12/31/2005  . Chronic insomnia 12/31/2005    Past Surgical History:  Procedure Laterality Date  . BREAST BIOPSY Right 2006   benign  . CHOLECYSTECTOMY  11/27/2011   Procedure: LAPAROSCOPIC CHOLECYSTECTOMY WITH INTRAOPERATIVE CHOLANGIOGRAM;  Surgeon: Adin Hector, MD;  Location: [place];  Service: General;  Laterality: N/A;  laparoscopic cholecystectomy with choleangiogram umbilical hernia repair  . COLONOSCOPY  07/2014   WNL Amedeo Plenty)  . dexa  08/2012   normal per patient  - no records available  . MITRAL VALVE REPAIR  1980   open heart  . ORIF ANKLE FRACTURE Left 02/26/2015   Procedure: OPEN REDUCTION INTERNAL FIXATION (ORIF) LEFT TRIMALLEOLAR ANKLE FRACTURE;  Surgeon: [name], MD;  Location: [place];  Service: Orthopedics;  Laterality: Left;  . TUBAL LIGATION  1980  . UMBILICAL HERNIA REPAIR  11/27/2011   Procedure: HERNIA REPAIR UMBILICAL ADULT;  Surgeon: Adin Hector, MD;  Location: [place];  Service: General;  Laterality: N/A;  . [place]   for fibroids -- partial, ovaries remain    OB History    No data available       Home Medications    Prior to Admission medications   Medication Sig Start Date End Date Taking? Authorizing Provider  ALPRAZolam Duanne Moron) 1 MG tablet Take 1 tablet (1 mg total) by mouth 3 (three) times daily. 08/18/15 08/17/16 Yes Cloria Spring, MD  amLODipine (NORVASC) 10 MG tablet Take 1 tablet by mouth  daily 01/25/15  Yes Ria Bush, MD  antiseptic oral rinse (BIOTENE) LIQD 15 mLs by Mouth Rinse route at bedtime.    Yes Historical Provider, MD  aspirin 325 MG EC tablet Take 325 mg by mouth daily.    Yes Historical Provider, MD  cholecalciferol (VITAMIN D) 1000 UNITS tablet Take 1,000 Units by mouth daily.    Yes Historical Provider, MD  cycloSPORINE (RESTASIS) 0.05 % ophthalmic emulsion Place 1 drop into both eyes 2 (two) times daily. 07/26/14  Yes Ria Bush, MD  diclofenac sodium (VOLTAREN) 1 % GEL Apply 1 application topically 2 (two) times daily as needed (pain).   Yes Historical Provider, MD  escitalopram (LEXAPRO) 20 MG tablet Take 1 tablet (20 mg total) by mouth 2 (two) times daily. 08/18/15 08/17/16 Yes Cloria Spring, MD  ezetimibe (ZETIA) 10 MG tablet Take 1 tablet (10 mg total) by mouth at bedtime. 09/18/15  Yes Ria Bush, MD  famotidine (PEPCID) 20 MG tablet Take 1 tablet by mouth two  times daily 03/06/15  Yes Ria Bush, MD  furosemide (LASIX) 20 MG tablet TAKE 1 TABLET BY MOUTH   DAILY AS NEEDED FOR EDEMA. Patient taking differently: TAKE 1 TABLET BY MOUTH  DAILY AS NEEDED FOR WEIGHT GAIN OF 3 LBS IN 24 HOURS 01/25/15  Yes Ria Bush, MD  gabapentin (NEURONTIN) 300 MG capsule Take 1 capsule by mouth 3  times a day 06/08/15  Yes Ria Bush, MD  hydroxychloroquine (PLAQUENIL) 200 MG tablet Take 200 mg by mouth daily.  11/05/14  Yes Ria Bush, MD  metoprolol succinate (TOPROL-XL) 50 MG 24 hr tablet TAKE 1/2 TABLET IN THE  MORNING AND 1 TABLET AT  NIGHT TIME 06/08/15  Yes Ria Bush, MD  pilocarpine (SALAGEN) 5 MG tablet Take 2.5-5 mg by mouth 2 (two) times daily as needed. Take 1 tablet (5 mg) by mouth every morning and 1/2 tablet (2.5 mg) at 2-2:30pm  if needed for Sjogren syndrome   Yes Historical Provider, MD  Polyethyl Glycol-Propyl Glycol (SYSTANE) 0.4-0.3 % GEL Place 1 drop into both eyes 2 (two) times daily.    Yes Historical Provider, MD  tiZANidine (ZANAFLEX) 4 MG tablet Take 1 tablet by mouth  every 8 hours as needed  muscle spasms 12/07/14  Yes Ria Bush, MD  zolpidem (AMBIEN CR) 12.5 MG CR tablet Take 1 tablet (12.5 mg total) by mouth at bedtime as needed for sleep. 02/03/15 02/03/16 Yes Cloria Spring, MD  amoxicillin (AMOXIL) 500 MG capsule Take 4 capsules (2000 mg) by mouth one hour prior to dental appointment. Patient not taking: Reported on 10/12/2015 09/18/15   Ria Bush, MD  ibuprofen (ADVIL,MOTRIN) 800 MG tablet Take 1 tablet (800 mg total) by mouth every 8 (eight) hours as needed for moderate pain. Patient not taking: Reported on 10/12/2015 06/26/15   Zenovia Jarred, DO  traMADol (ULTRAM) 50 MG tablet Take 1 tablet (50 mg total) by mouth every 12 (twelve) hours as needed for severe pain. Patient not taking: Reported on 10/12/2015 09/08/14   Ria Bush, MD    Family History Family History  Problem Relation Age of Onset  . Lupus Sister     and niece  . Cancer Mother     lung (nonsmoker)  . CAD Mother     MI in her 65s    . ALS Mother   . Kidney disease Father   . Alcohol abuse Father   . Diabetes Father   . Cancer Brother     bone  . Diabetes Sister   . Stroke Sister   . Cancer Maternal Uncle     bone  . Depression Sister   . Kidney failure Other     on HD  . Diabetes Brother   . Stroke Maternal Grandmother     Social History Social History  Substance Use Topics  . Smoking status: Never Smoker  . Smokeless tobacco: Never Used  . Alcohol use No     Allergies   Cymbalta [duloxetine hcl]; Statins; and Sulfa antibiotics   Review of Systems Review of Systems  Cardiovascular: Negative for chest pain.  Gastrointestinal: Negative for abdominal pain.  Musculoskeletal: Positive for neck pain. Negative for back pain.  Neurological: Positive for speech difficulty, numbness and headaches. Negative for weakness.  All other systems reviewed and are negative.    Physical Exam Updated Vital Signs BP 146/82   Pulse 70   Temp 98.3 F (36.8 C)   Resp 12   SpO2 100%   Physical Exam  Constitutional: She is oriented to person, place, and time. She appears well-developed and well-nourished.  HENT:  Head: Normocephalic and atraumatic.  Right Ear: External ear normal.  Left Ear: External ear normal.  Nose: Nose normal.  Eyes: EOM are normal. Pupils are equal, round, and reactive to light. Right eye exhibits no discharge. Left eye exhibits no discharge.  Cardiovascular: Normal rate, regular rhythm and normal heart sounds.   Pulmonary/Chest: Effort normal and breath sounds normal.  Abdominal: Soft. There is no tenderness.  Neurological: She is alert and oriented to person, place, and time.  CN 3-12 grossly intact. 5/5 strength in all 4 extremities. Grossly normal sensation except decreased sensation in Right hand and radial right forearm. Normal finger to nose.   Skin: Skin is warm and dry.  Nursing note and vitals reviewed.    ED Treatments / Results  Labs (all labs ordered are listed, but  only abnormal results are displayed) Labs Reviewed  DIFFERENTIAL - Abnormal; Notable for the following:       Result Value   Neutro Abs 1.6 (*)    All other components within normal limits  COMPREHENSIVE METABOLIC PANEL - Abnormal; Notable for the following:    Chloride 113 (*)    Creatinine, Ser 1.09 (*)    GFR calc non Af Amer 53 (*)    All other components within normal limits  URINALYSIS, ROUTINE W REFLEX MICROSCOPIC (NOT AT [facility]) - Abnormal; Notable for the following:    APPearance CLOUDY (*)    Leukocytes, UA LARGE (*)    All other components within normal limits  URINE MICROSCOPIC-ADD ON - Abnormal; Notable for the following:    Squamous Epithelial / LPF 6-30 (*)    Bacteria, UA RARE (*)    All other components within normal limits  ETHANOL  PROTIME-INR  APTT  CBC  URINE RAPID DRUG SCREEN, HOSP PERFORMED  I-STAT TROPOININ, ED    EKG  EKG Interpretation  Date/Time:  Thursday October 12 2015 09:50:40 EDT Ventricular Rate:  71 PR Interval:    QRS Duration: 93 QT Interval:  416 QTC Calculation: 453 R Axis:   81 Text Interpretation:  Sinus rhythm Borderline right axis deviation Nonspecific T abnrm, anterolateral leads Baseline wander in lead(s) V1 no significant change since Oct 2016 Confirmed by Regenia Skeeter MD, Lyndon Chenoweth [phone]) on 10/12/2015 9:54:10 AM Also confirmed by Regenia Skeeter MD, Haydin Calandra [phone]), editor [place], Joelene Millin [phone])  on 10/12/2015 11:55:27 AM       Radiology Ct Head Wo Contrast  Result Date: 10/12/2015 CLINICAL DATA:  Several recent episodes of right upper extremity numbness. Transient right-sided facial numbness and slurring of speech 1 day prior. EXAM: CT HEAD WITHOUT CONTRAST TECHNIQUE: Contiguous axial images were obtained from the base of the skull through the vertex without intravenous contrast. COMPARISON:  March 11, 2015 FINDINGS: Brain: The ventricles are normal in size and configuration. There is no intracranial mass, hemorrhage, extra-axial fluid  collection, or midline shift. There is mild small vessel disease in the centra semiovale bilaterally. Elsewhere gray-white compartments appear normal. No acute infarct is evident. Vascular: No hyperdense vessels are noted. There are foci of calcification in each carotid siphon. Skull: The bony calvarium appears intact. Sinuses/Orbits: Orbits appear symmetric bilaterally. Visualized paranasal sinuses are clear. Other: Mastoid air cells are clear. IMPRESSION: Slight periventricular small vessel disease. No intracranial mass, hemorrhage, or evidence suggesting acute infarct. There are scattered foci of vascular calcification in both carotid siphon regions. Electronically Signed   By: Lowella Grip III M.D.   On: 10/12/2015 11:03   Procedures Procedures (including critical care time)  Medications Ordered in ED Medications - No data to display   Initial Impression / Assessment and Plan / ED Course  I have reviewed the triage vital signs and the nursing notes.  Pertinent labs & imaging results that were available during my care of the patient were reviewed by me and considered in my medical decision making (see chart for details).  Clinical Course  Comment By Time  Has right arm/hand numbness but did not know she had that. Other CVA symptoms are gone. No indication to call code stroke given onset yesterday. Will do TIA workup Sherwood Gambler, MD 07/27 [phone]  D/w Dr. Tasia Catchings, neuro, who will see patient. Will admit for TIA obs. Sherwood Gambler, MD 07/27 1148  D/w Ebony Hail, hospitalist will admit for TIA workup. No significant findings on workup. Stable Sherwood Gambler,  MD 07/27 1201     Final Clinical Impressions(s) / ED Diagnoses   Final diagnoses:  Transient cerebral ischemia, unspecified transient cerebral ischemia type    New Prescriptions New Prescriptions   No medications on file     Sherwood Gambler, MD 10/12/15 1203

## 2015-10-12 NOTE — Telephone Encounter (Signed)
Patient Name: Alexandria Leach DOB: 1950/09/05 Initial Comment Caller States she is having numbness in her face, slurred speech, numbness in her arms Nurse Assessment Nurse: Rock Nephew, RN, Marliss Coots Date/Time (Eastern Time): 10/12/2015 8:15:28 AM Confirm and document reason for call. If symptomatic, describe symptoms. You must click the next button to save text entered. ---Caller states that she is having numbness in her face and her arm and she also has slurred speech. This all started Saturday and then went away but came back yesterday with tingling in her face and now numbness. Has the patient traveled out of the country within the last 30 days? ---Not Applicable Does the patient have any new or worsening symptoms? ---Yes Will a triage be completed? ---Yes Related visit to physician within the last 2 weeks? ---No Does the PT have any chronic conditions? (i.e. diabetes, asthma, etc.) ---Yes List chronic conditions. ---HTN, fibromyalgia, sjorgren's syndrome Is this a behavioral health or substance abuse call? ---No Guidelines Guideline Title Affirmed Question Affirmed Notes Neurologic Deficit Headache (and neurologic deficit) Final Disposition User Go to ED Now Rock Nephew, RN, [facility] - ED

## 2015-10-12 NOTE — ED Notes (Signed)
Pt transported to CT ?

## 2015-10-12 NOTE — Progress Notes (Signed)
Pt arrived to 5M13 via stretcher.  Pt ambulated from stretcher to bed without difficulty.  Pt is alert and oriented in no complaints of pain.  Telemetry applied and CCMD notified.  VSS.  Will continue to monitor.  Cori Razor, RN

## 2015-10-12 NOTE — Telephone Encounter (Signed)
Noted  

## 2015-10-13 ENCOUNTER — Observation Stay (HOSPITAL_BASED_OUTPATIENT_CLINIC_OR_DEPARTMENT_OTHER): Payer: Medicare Other

## 2015-10-13 DIAGNOSIS — G459 Transient cerebral ischemic attack, unspecified: Secondary | ICD-10-CM | POA: Diagnosis not present

## 2015-10-13 DIAGNOSIS — I350 Nonrheumatic aortic (valve) stenosis: Secondary | ICD-10-CM

## 2015-10-13 DIAGNOSIS — I5032 Chronic diastolic (congestive) heart failure: Secondary | ICD-10-CM

## 2015-10-13 DIAGNOSIS — N183 Chronic kidney disease, stage 3 (moderate): Secondary | ICD-10-CM

## 2015-10-13 DIAGNOSIS — I10 Essential (primary) hypertension: Secondary | ICD-10-CM

## 2015-10-13 LAB — URINE CULTURE

## 2015-10-13 LAB — LIPID PANEL
CHOL/HDL RATIO: 2.6 ratio
CHOLESTEROL: 222 mg/dL — AB (ref 0–200)
HDL: 86 mg/dL (ref >40)
LDL Cholesterol: 125 mg/dL — ABNORMAL HIGH (ref 0–99)
TRIGLYCERIDES: 54 mg/dL (ref <150)
VLDL: 11 mg/dL (ref 0–40)

## 2015-10-13 LAB — ECHOCARDIOGRAM COMPLETE

## 2015-10-13 NOTE — Care Management Obs Status (Signed)
[place] NOTIFICATION   Patient Details  Name: Alexandria Leach MRN: [id number] Date of Birth: 1951-01-05   Medicare Observation Status Notification Given:  Yes (MRI negative)    Pollie Friar, RN 10/13/2015, 1:48 PM

## 2015-10-13 NOTE — Progress Notes (Signed)
Pt for discharge home today. Discharge orders received. IV and telemetry dcd with dressing clean dry and intact to lower back. Discharge instructions and prescriptions given with verbalized understanding. Family at bedside to assist with discharge. Staff brought patient to lobby via wheelchair at 1525. Transported to home by family member.

## 2015-10-13 NOTE — Discharge Summary (Signed)
Physician Discharge Summary  Alexandria Leach [id number] DOB: 11-Nov-1950 DOA: 10/12/2015  PCP: Ria Bush, MD  Admit date: 10/12/2015 Discharge date: 10/13/2015  Time spent: 30 minutes  Recommendations for Outpatient Follow-up:  1. Follow-up with PCP in 2 weeks    Discharge Diagnoses:  Principal Problem:   TIA (transient ischemic attack) Active Problems:   HYPERCHOLESTEROLEMIA   MDD (major depressive disorder), recurrent episode ([place])   Essential hypertension   Aortic stenosis   Diastolic CHF (HCC)   Sjogren's syndrome (HCC)   CKD (chronic kidney disease) stage 3, GFR 30-59 ml/min   Fibromyalgia   History of pulmonary embolism   Abnormal urinalysis   Mild mitral regurgitation   Discharge Condition: Stable  Diet recommendation: Cardiac   History of present illness and Hospital Course:  Alexandria Leach is a 65 y.o. female with medical history significant for hypertension and chronic diastolic dysfunction, dyslipidemia, Sjogren's syndrome fibromyalgia, prior PE, aortic stenosis and CK D stage III presents to the hospital with recurrent transient right arm and facial numbness. Patient has had 3 episodes. The first was at the early part of July with focal right arm numbness and tingling without loss of strength duration 5 seconds; the second episode was this past Saturday where the entire right arm became totally numb (again without loss of motor function/change in strength) duration 5 minutes; the third episode happened yesterday with the entire arm once again becoming numb as before with tingling extending into the face with numbness and right facial drooping with duration about 10 minutes. She notified her PCP who instructed her to present to the ER for evaluation.CT head without contrast: Slight periventricular small vessel disease otherwise no acute findings; scattered foci of vascular calcification in both carotid siphon regions TIA (transient ischemic attack) -MRI brain  negative for stroke -Echocardiogram had recent echocardiogram done February 2017 within normal limits -The symptoms seem to be coming from cervical spondylosis. -Continue with  aspirin  -Continue with Zetia      HYPERCHOLESTEROLEMIA -On Zetia prior to admission -Follow up on lipid panel    Essential hypertension/grade 2 Diastolic CHF -Current blood pressure well controlled on preadmission medications of Norvasc and Toprol -Echocardiogram August 2016 with preserved LV function with grade 2 diastolic dysfunction features -Has prn Lasix available based on weight gain    Sjogren's syndrome  -Continue preadmission Plaquenil, Restasis and oral Biotene    CKD (chronic kidney disease) stage 3, GFR 30-59 ml/min -Current GFR greater than 64 does not meet parameters for CK D stage III    MDD (major depressive disorder), recurrent episode  -Continue preadmission Xanax, and Lexapro    Aortic stenosis (mild)/mild-to-moderate mitral regurgitation -Asymptomatic    Fibromyalgia -Continue preadmission gabapentin, Ultram, Zanaflex, Lexapro    History of pulmonary embolism -Not on anticoagulation prior to admission      Consultations:  Neurology  Discharge Exam: Vitals:   10/13/15 1105 10/13/15 1450  BP: 140/72 131/64  Pulse: 69 64  Resp: 20 20  Temp: 98.3 F (36.8 C) 98.4 F (36.9 C)    General:Well conscious oriented  Cardiovascular: S1-S2 regular Respiratory: Lungs clear to auscultation  Discharge Instructions    Current Discharge Medication List    CONTINUE these medications which have NOT CHANGED   Details  ALPRAZolam (XANAX) 1 MG tablet Take 1 tablet (1 mg total) by mouth 3 (three) times daily. Qty: 90 tablet, Refills: 2    amLODipine (NORVASC) 10 MG tablet Take 1 tablet by mouth  daily Qty: 90 tablet,  Refills: 3    antiseptic oral rinse (BIOTENE) LIQD 15 mLs by Mouth Rinse route at bedtime.     aspirin 325 MG EC tablet Take 325 mg by mouth daily.      cholecalciferol (VITAMIN D) 1000 UNITS tablet Take 1,000 Units by mouth daily.     cycloSPORINE (RESTASIS) 0.05 % ophthalmic emulsion Place 1 drop into both eyes 2 (two) times daily. Qty: 3 each, Refills: 2    diclofenac sodium (VOLTAREN) 1 % GEL Apply 1 application topically 2 (two) times daily as needed (pain).    escitalopram (LEXAPRO) 20 MG tablet Take 1 tablet (20 mg total) by mouth 2 (two) times daily. Qty: 180 tablet, Refills: 2    ezetimibe (ZETIA) 10 MG tablet Take 1 tablet (10 mg total) by mouth at bedtime. Qty: 90 tablet, Refills: 3    famotidine (PEPCID) 20 MG tablet Take 1 tablet by mouth two  times daily Qty: 180 tablet, Refills: 3    furosemide (LASIX) 20 MG tablet TAKE 1 TABLET BY MOUTH  DAILY AS NEEDED FOR EDEMA. Qty: 90 tablet, Refills: 1    gabapentin (NEURONTIN) 300 MG capsule Take 1 capsule by mouth 3  times a day Qty: 270 capsule, Refills: 0    hydroxychloroquine (PLAQUENIL) 200 MG tablet Take 200 mg by mouth daily.     metoprolol succinate (TOPROL-XL) 50 MG 24 hr tablet TAKE 1/2 TABLET IN THE  MORNING AND 1 TABLET AT  NIGHT TIME Qty: 135 tablet, Refills: 0    pilocarpine (SALAGEN) 5 MG tablet Take 2.5-5 mg by mouth 2 (two) times daily as needed. Take 1 tablet (5 mg) by mouth every morning and 1/2 tablet (2.5 mg) at 2-2:30pm if needed for Sjogren syndrome    Polyethyl Glycol-Propyl Glycol (SYSTANE) 0.4-0.3 % GEL Place 1 drop into both eyes 2 (two) times daily.     tiZANidine (ZANAFLEX) 4 MG tablet Take 1 tablet by mouth  every 8 hours as needed  muscle spasms Qty: 30 tablet, Refills: 3    zolpidem (AMBIEN CR) 12.5 MG CR tablet Take 1 tablet (12.5 mg total) by mouth at bedtime as needed for sleep. Qty: 90 tablet, Refills: 2    ibuprofen (ADVIL,MOTRIN) 800 MG tablet Take 1 tablet (800 mg total) by mouth every 8 (eight) hours as needed for moderate pain. Qty: 30 tablet, Refills: 0    traMADol (ULTRAM) 50 MG tablet Take 1 tablet (50 mg total) by mouth every  12 (twelve) hours as needed for severe pain. Qty: 30 tablet, Refills: 3      STOP taking these medications     amoxicillin (AMOXIL) 500 MG capsule        Allergies  Allergen Reactions  . Cymbalta [Duloxetine Hcl] Other (See Comments)    tachycardia  . Statins Nausea Only and Other (See Comments)    Muscle cramps also  . Sulfa Antibiotics Nausea And Vomiting   Follow-up Information    Ria Bush, MD. Schedule an appointment as soon as possible for a visit in 2 week(s).   Specialty:  Family Medicine Contact information: [place]  [phone]            The results of significant diagnostics from this hospitalization (including imaging, microbiology, ancillary and laboratory) are listed below for reference.    Significant Diagnostic Studies: Ct Head Wo Contrast  Result Date: 10/12/2015 CLINICAL DATA:  Several recent episodes of right upper extremity numbness. Transient right-sided facial numbness and slurring of  speech 1 day prior. EXAM: CT HEAD WITHOUT CONTRAST TECHNIQUE: Contiguous axial images were obtained from the base of the skull through the vertex without intravenous contrast. COMPARISON:  March 11, 2015 FINDINGS: Brain: The ventricles are normal in size and configuration. There is no intracranial mass, hemorrhage, extra-axial fluid collection, or midline shift. There is mild small vessel disease in the centra semiovale bilaterally. Elsewhere gray-white compartments appear normal. No acute infarct is evident. Vascular: No hyperdense vessels are noted. There are foci of calcification in each carotid siphon. Skull: The bony calvarium appears intact. Sinuses/Orbits: Orbits appear symmetric bilaterally. Visualized paranasal sinuses are clear. Other: Mastoid air cells are clear. IMPRESSION: Slight periventricular small vessel disease. No intracranial mass, hemorrhage, or evidence suggesting acute infarct. There are scattered foci of  vascular calcification in both carotid siphon regions. Electronically Signed   By: Lowella Grip III M.D.   On: 10/12/2015 11:03  Mr Jodene Nam Head Wo Contrast  Result Date: 10/12/2015 CLINICAL DATA:  TIA. Right-sided numbness. Right facial droop and slurred speech. EXAM: MRI HEAD WITHOUT CONTRAST MRA HEAD WITHOUT CONTRAST TECHNIQUE: Multiplanar, multiecho pulse sequences of the brain and surrounding structures were obtained without intravenous contrast. Angiographic images of the head were obtained using MRA technique without contrast. COMPARISON:  CT head 10/12/2015.  MRI 12/09/2014 FINDINGS: MRI HEAD FINDINGS Ventricle size normal.  Cerebral volume normal for age. Negative for acute infarct. Chronic microvascular ischemic changes in the cerebral white matter and pons are unchanged. 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 Negative for intracranial hemorrhage.  No fluid collection. Negative for mass or edema.  No shift of the midline structures. Circle of Jannifer Franklin is patent. Paranasal sinuses clear. No orbital lesion. Pituitary not enlarged. MRA HEAD FINDINGS Both vertebral arteries patent to the basilar. PICA not visualized. AICA, superior cerebellar, posterior cerebral arteries are all widely patent appear normal. Internal carotid artery widely patent without stenosis. Anterior and middle cerebral arteries patent bilaterally without stenosis or occlusion. Negative for cerebral aneurysm. IMPRESSION: No acute intracranial abnormality. Moderate chronic microvascular ischemic change in the white matter and pons. Negative MRA head. Electronically Signed   By: Franchot Gallo M.D.   On: 10/12/2015 20:21  Mr Brain Wo Contrast  Result Date: 10/12/2015 CLINICAL DATA:  TIA. Right-sided numbness. Right facial droop and slurred speech. EXAM: MRI HEAD WITHOUT CONTRAST MRA HEAD WITHOUT CONTRAST TECHNIQUE: Multiplanar, multiecho pulse sequences of the brain and surrounding structures were obtained without  intravenous contrast. Angiographic images of the head were obtained using MRA technique without contrast. COMPARISON:  CT head 10/12/2015.  MRI 12/09/2014 FINDINGS: MRI HEAD FINDINGS Ventricle size normal.  Cerebral volume normal for age. Negative for acute infarct. Chronic microvascular ischemic changes in the cerebral white matter and pons are unchanged. 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 Negative for intracranial hemorrhage.  No fluid collection. Negative for mass or edema.  No shift of the midline structures. Circle of Jannifer Franklin is patent. Paranasal sinuses clear. No orbital lesion. Pituitary not enlarged. MRA HEAD FINDINGS Both vertebral arteries patent to the basilar. PICA not visualized. AICA, superior cerebellar, posterior cerebral arteries are all widely patent appear normal. Internal carotid artery widely patent without stenosis. Anterior and middle cerebral arteries patent bilaterally without stenosis or occlusion. Negative for cerebral aneurysm. IMPRESSION: No acute intracranial abnormality. Moderate chronic microvascular ischemic change in the white matter and pons. Negative MRA head. Electronically Signed   By: Franchot Gallo M.D.   On: 10/12/2015 20:21  Dg Bone Density  Result Date:  09/14/2015 EXAM: DUAL X-RAY ABSORPTIOMETRY (DXA) FOR BONE MINERAL DENSITY IMPRESSION: Referring Physician:  Ria Bush PATIENT: Name: Vernecia, Lazer Patient ID: [id number] Birth Date: 1951-03-01 Height: 60.0 in. Sex: Female Measured: 09/14/2015 Weight: 149.5 lbs. Indications: Estrogen Deficient, History of Fracture (Adult) (V15.51), Hysterectomy, Lexapro, Low Calcium Intake (269.3), Pepcid, Postmenopausal Fractures: Ankle Treatments: Vitamin D (E933.5) ASSESSMENT: The BMD measured at Femur Neck Left is 0.891 g/cm2 with a T-score of -1.1. This patient's diagnostic category is considered LOW BONE MASS/OSTEOPENIA according to [facility]) criteria. L-3 was excluded due to  degenerative changes. Site Region Measured Date Measured Age [age]A BMD Significant CHANGE T-score DualFemur Neck Left  11/14/[age]    63.3         -1.1    0.891 g/cm2 AP Spine  L1-L4 (L3) 09[age]14/2015    [age]         -0.2    1.161 g/cm2 World Health Organization [facility]) criteria for post-menopausal, Caucasian Women: Normal       T-score at or above -1 SD Osteopenia   T-score between -1 and -2.5 SD Osteoporosis T-score at or below -2.5 SD RECOMMENDATION: [place] recommends that FDA-approved medical therapies be considered in postmenopausal women and men age 51 or older with a: 1. Hip or vertebral (clinical or morphometric) fracture. 2. T-score of <-2.5 at the spine or hip. 3. Ten-year fracture probability by FRAX of 3% or greater for hip fracture or 20% or greater for major osteoporotic fracture. All treatment decisions require clinical judgment and consideration of individual patient factors, including patient preferences, co-morbidities, previous drug use, risk factors not captured in the FRAX model (e.g. falls, vitamin D deficiency, increased bone turnover, interval significant decline in bone density) and possible under - or over-estimation of fracture risk by FRAX. All patients should ensure an adequate intake of dietary calcium (1200 mg/d) and vitamin D (800 IU daily) unless contraindicated. FOLLOW-UP: People with diagnosed cases of osteoporosis or at high risk for fracture should have regular bone mineral density tests. For patients eligible for Medicare, routine testing is allowed once every 2 years. The testing frequency can be increased to one year for patients who have rapidly progressing disease, those who are receiving or discontinuing medical therapy to restore bone mass, or have additional risk factors. I have reviewed this report, and agree with the above findings. [facility] FRAX* 10-year Probability of Fracture Based on femoral neck BMD: DualFemur (Left) Major Osteoporotic  Fracture: 5.8% Hip Fracture:                0.4% Population:                  [place] (Black) Risk Factors:                History of Fracture (Adult) (V15.51) *FRAX is a Materials engineer of the [employer] of [employer] for Metabolic Bone Disease, a World Pharmacologist (WHO) [facility]. ASSESSMENT: The probability of a major osteoporotic fracture is 5.8 % within the next ten years. The probability of a hip fracture is 0.4 % within the next ten tears. Electronically Signed   By: Margarette [place] M.D.   On: 09/14/2015 13:38    Microbiology: No results found for this or any previous visit (from the past 240 hour(s)).   Labs: Basic Metabolic Panel:  Recent Labs Lab 10/12/15 1000  NA 141  K 4.0  CL 113*  CO2 23  GLUCOSE 80  BUN 10  CREATININE  1.09*  CALCIUM 9.6   Liver Function Tests:  Recent Labs Lab 10/12/15 1000  AST 27  ALT 15  ALKPHOS 55  BILITOT 0.6  PROT 7.2  ALBUMIN 3.9   No results for input(s): LIPASE, AMYLASE in the last 168 hours. No results for input(s): AMMONIA in the last 168 hours. CBC:  Recent Labs Lab 10/12/15 1000  WBC 4.0  NEUTROABS 1.6*  HGB 12.7  HCT 39.4  MCV 82.8  PLT 203   Cardiac Enzymes: No results for input(s): CKTOTAL, CKMB, CKMBINDEX, TROPONINI in the last 168 hours. BNP: BNP (last 3 results) No results for input(s): BNP in the last 8760 hours.  ProBNP (last 3 results) No results for input(s): PROBNP in the last 8760 hours.  CBG: No results for input(s): GLUCAP in the last 168 hours.     SignedMonica Becton MD.  [facility] 10/13/2015, 2:57 PM

## 2015-10-13 NOTE — Progress Notes (Signed)
  Echocardiogram 2D Echocardiogram has been performed.  Diamond Nickel 10/13/2015, 1:39 PM

## 2015-10-13 NOTE — Care Management Note (Signed)
Case Management Note  Patient Details  Name: Alexandria Leach MRN: [id number] Date of Birth: May 26, 1950  Subjective/Objective:   Pt admitted with TIA. MRI results negative. She is from home with her spouse.                  Action/Plan: Pt is discharging home with self care. No further needs per CM.   Expected Discharge Date:                  Expected Discharge Plan:  Home/Self Care  In-House Referral:     Discharge planning Services     Post Acute Care Choice:    Choice offered to:     DME Arranged:    DME Agency:     HH Arranged:    [facility] Agency:     Status of Service:  Completed, signed off  If discussed at [employer] of Stay Meetings, dates discussed:    Additional Comments:  Pollie Friar, RN 10/13/2015, 2:14 PM

## 2015-10-14 DIAGNOSIS — S82853A Displaced trimalleolar fracture of unspecified lower leg, initial encounter for closed fracture: Secondary | ICD-10-CM | POA: Diagnosis not present

## 2015-10-14 LAB — HEMOGLOBIN A1C
HEMOGLOBIN A1C: 5.6 % (ref 4.8–5.6)
Mean Plasma Glucose: 114 mg/dL

## 2015-10-16 ENCOUNTER — Telehealth: Payer: Self-pay

## 2015-10-16 NOTE — Telephone Encounter (Signed)
Pt returned call.  Transition Care Management Follow-up Telephone Call     Date discharged? 10/13/2015  How have you been since you were released from the hospital? Health status improving but still experiencing generalized weakness   Any patient concerns? None    Do you understand why you were in the hospital? Yes   Do you understand the discharge instructions? Yes   Where were you discharged to? Home   Items Reviewed:  Medications reviewed: Yes   Allergies reviewed: Yes  Dietary changes reviewed: Yes  Referrals reviewed: N/A   Functional Questionnaire:  Independent - I Dependent - D    Activities of Daily Living (ADLs):    Pt is independent with the following ADLs: Personal hygiene  Dressing Eating  Maintaining continence Transferring - may use a cane or walker PRN  Independent Activities of Daily Living (ADLs):  Pt is independent with the following iADLs:  Basic Corporate treasurer Housework  Managing medications  Pt is dependent with the following iADLs:  Managing personal finances - son manages finances   Confirmed importance and date/time of follow-up visits scheduled YES  Provider Appointment booked with PCP 10/17/15 @ 1130  Confirmed with patient if condition begins to worsen call PCP or go to the ER.  Patient was given the office number and encouraged to call back with question or concerns: YES

## 2015-10-16 NOTE — Telephone Encounter (Signed)
2nd attempt to reach pt for TCM outreach. Left voicemail with contact info.

## 2015-10-16 NOTE — Telephone Encounter (Signed)
1st attempt for TCM outreach. Left voicemail message.

## 2015-10-17 ENCOUNTER — Encounter: Payer: Self-pay | Admitting: Family Medicine

## 2015-10-17 ENCOUNTER — Ambulatory Visit (INDEPENDENT_AMBULATORY_CARE_PROVIDER_SITE_OTHER): Payer: Medicare Other | Admitting: Family Medicine

## 2015-10-17 VITALS — BP 156/86 | HR 64 | Temp 98.4°F | Wt 149.8 lb

## 2015-10-17 DIAGNOSIS — G459 Transient cerebral ischemic attack, unspecified: Secondary | ICD-10-CM

## 2015-10-17 DIAGNOSIS — I10 Essential (primary) hypertension: Secondary | ICD-10-CM

## 2015-10-17 DIAGNOSIS — M8580 Other specified disorders of bone density and structure, unspecified site: Secondary | ICD-10-CM

## 2015-10-17 DIAGNOSIS — E7800 Pure hypercholesterolemia, unspecified: Secondary | ICD-10-CM | POA: Diagnosis not present

## 2015-10-17 DIAGNOSIS — Z5189 Encounter for other specified aftercare: Secondary | ICD-10-CM

## 2015-10-17 MED ORDER — HYDROCHLOROTHIAZIDE 12.5 MG PO CAPS: 12.5000 mg | ORAL_CAPSULE | Freq: Every day | ORAL | 1 refills | Status: DC

## 2015-10-17 NOTE — Progress Notes (Signed)
Pre visit review using our clinic review tool, if applicable. No additional management support is needed unless otherwise documented below in the visit note. 

## 2015-10-17 NOTE — Assessment & Plan Note (Signed)
Chronic, uncontrolled. Will start hctz 12.5mg  daily. Check K+ at f/u labs in 6 wks. H/o hypokalemia to hctz.

## 2015-10-17 NOTE — Assessment & Plan Note (Addendum)
Statin intolerance. Continue zetia.  If LDL remains elevated, consider re-trial low potency statin

## 2015-10-17 NOTE — Patient Instructions (Addendum)
You are doing well today. Blood pressure is too high. Decrease salt in diet. Start hydrochlorothiazide 12.5mg  once daily for blood pressure.  Keep follow up with cardiology.  Return to see me in 6 wks for annual exam physical

## 2015-10-17 NOTE — Assessment & Plan Note (Addendum)
Reviewed recent TIA vs migraine variant. Pt on aspirin 325mg  daily, zetia. Check FLP at CPE. Pt denies significant migraines recently, endorses headache from hypertension.

## 2015-10-17 NOTE — Progress Notes (Signed)
BP (!) 156/86 (BP Location: Right Arm, Cuff Size: Normal)   Pulse 64   Temp 98.4 F (36.9 C) (Oral)   Wt 149 lb 12 oz (67.9 kg)   BMI 27.39 kg/m    CC: hosp f/u visit. Subjective:    Patient ID: Alexandria Leach, female    DOB: 1950-05-04, 65 y.o.   MRN: [id number]  HPI: Alexandria Leach is a 65 y.o. female presenting on 10/17/2015 for Follow-up (hospital)   Recent hospitalization for TIA which presented with recurrent R arm and facial numbness x3. CT and MRI brain were negative for stroke. Echocardiogram WNL. Neurology questioned migraine variant as cause. Aspirin and zetia were continued.  She endorses mild headaches but no recent migraines.  Taking amlodipine 10mg  daily with metoprolol XL 50mg  1/2 in am and 1 at night.   Sore today, attributing to fibromyalgia.  Recent 2D echo 09/2015 Impression: - Mild basal septal LV hypertrophy with LVEF 60-65%. Grade 2   diastolic dysfunction with increased LV filling pressure.   Moderate left atrial enlargement. Thickened mitral valve with   history of rheumatic heart disease and previous repair in the   1980s. There is evidence of mild mitral stenosis with mild to   moderate, posteriorly directed mitral regurgitation, mean   gradient 6 mmHg. Mild calcific aortic stenosis noted with mild   aortic regurgitation. Mildly reduced RV contraction. Trivial   tricuspid regurgitation with PASP 39 mmHg.  MRI HEAD WITHOUT CONTRAST MRA HEAD WITHOUT CONTRAST IMPRESSION: No acute intracranial abnormality. Moderate chronic microvascular ischemic change in the white matter and pons. Negative MRA head. Electronically Signed   By: Franchot Gallo M.D.   On: 10/12/2015 20:21  Admit date: 10/12/2015 Discharge date: 10/13/2015 F/u phone call: 10/16/2015  Recommendations for Outpatient Follow-up:  1. Follow-up with PCP in 2 weeks   Discharge Diagnoses:  Principal Problem:   TIA (transient ischemic attack) Active Problems:    HYPERCHOLESTEROLEMIA   MDD (major depressive disorder), recurrent episode ([place])   Essential hypertension   Aortic stenosis   Diastolic CHF (HCC)   Sjogren's syndrome (HCC)   CKD (chronic kidney disease) stage 3, GFR 30-59 ml/min   Fibromyalgia   History of pulmonary embolism   Abnormal urinalysis   Mild mitral regurgitation  Discharge Condition: Stable  Diet recommendation: Cardiac  Relevant past medical, surgical, family and social history reviewed and updated as indicated. Interim medical history since our last visit reviewed. Allergies and medications reviewed and updated. Current Outpatient Prescriptions on File Prior to Visit  Medication Sig  . ALPRAZolam (XANAX) 1 MG tablet Take 1 tablet (1 mg total) by mouth 3 (three) times daily.  Marland Kitchen amLODipine (NORVASC) 10 MG tablet Take 1 tablet by mouth  daily  . antiseptic oral rinse (BIOTENE) LIQD 15 mLs by Mouth Rinse route at bedtime.   Marland Kitchen aspirin 325 MG EC tablet Take 325 mg by mouth daily.   . cholecalciferol (VITAMIN D) 1000 UNITS tablet Take 1,000 Units by mouth daily.   . cycloSPORINE (RESTASIS) 0.05 % ophthalmic emulsion Place 1 drop into both eyes 2 (two) times daily.  . diclofenac sodium (VOLTAREN) 1 % GEL Apply 1 application topically 2 (two) times daily as needed (pain).  Marland Kitchen escitalopram (LEXAPRO) 20 MG tablet Take 1 tablet (20 mg total) by mouth 2 (two) times daily.  Marland Kitchen ezetimibe (ZETIA) 10 MG tablet Take 1 tablet (10 mg total) by mouth at bedtime.  . famotidine (PEPCID) 20 MG tablet Take 1 tablet by mouth two  times daily  . furosemide (LASIX) 20 MG tablet TAKE 1 TABLET BY MOUTH  DAILY AS NEEDED FOR EDEMA. (Patient taking differently: TAKE 1 TABLET BY MOUTH  DAILY AS NEEDED FOR WEIGHT GAIN OF 3 LBS IN 24 HOURS)  . gabapentin (NEURONTIN) 300 MG capsule Take 1 capsule by mouth 3  times a day  . hydroxychloroquine (PLAQUENIL) 200 MG tablet Take 200 mg by mouth daily.   . metoprolol succinate (TOPROL-XL) 50 MG 24 hr tablet TAKE  1/2 TABLET IN THE  MORNING AND 1 TABLET AT  NIGHT TIME  . pilocarpine (SALAGEN) 5 MG tablet Take 2.5-5 mg by mouth 2 (two) times daily as needed. Take 1 tablet (5 mg) by mouth every morning and 1/2 tablet (2.5 mg) at 2-2:30pm if needed for Sjogren syndrome  . Polyethyl Glycol-Propyl Glycol (SYSTANE) 0.4-0.3 % GEL Place 1 drop into both eyes 2 (two) times daily.   Marland Kitchen tiZANidine (ZANAFLEX) 4 MG tablet Take 1 tablet by mouth  every 8 hours as needed  muscle spasms  . traMADol (ULTRAM) 50 MG tablet Take 1 tablet (50 mg total) by mouth every 12 (twelve) hours as needed for severe pain.  Marland Kitchen zolpidem (AMBIEN CR) 12.5 MG CR tablet Take 1 tablet (12.5 mg total) by mouth at bedtime as needed for sleep.   No current facility-administered medications on file prior to visit.     Review of Systems Per HPI unless specifically indicated in ROS section     Objective:    BP (!) 156/86 (BP Location: Right Arm, Cuff Size: Normal)   Pulse 64   Temp 98.4 F (36.9 C) (Oral)   Wt 149 lb 12 oz (67.9 kg)   BMI 27.39 kg/m   Wt Readings from Last 3 Encounters:  10/17/15 149 lb 12 oz (67.9 kg)  08/29/15 149 lb 8 oz (67.8 kg)  02/22/15 154 lb (69.9 kg)    Physical Exam  Constitutional: She appears well-developed and well-nourished. No distress.  HENT:  Mouth/Throat: Oropharynx is clear and moist. No oropharyngeal exudate.  Cardiovascular: Normal rate, regular rhythm, normal heart sounds and intact distal pulses.   No murmur heard. No murmur appreciated today  Pulmonary/Chest: Effort normal and breath sounds normal. No respiratory distress. She has no wheezes. She has no rales.  Musculoskeletal: She exhibits no edema.  Skin: Skin is warm and dry. No rash noted.  Nursing note and vitals reviewed.      Assessment & Plan:   Problem List Items Addressed This Visit    Essential hypertension    Chronic, uncontrolled. Will start hctz 12.5mg  daily. Check K+ at f/u labs in 6 wks. H/o hypokalemia to hctz.        Relevant Medications   hydrochlorothiazide (MICROZIDE) 12.5 MG capsule   HYPERCHOLESTEROLEMIA    Statin intolerance. Continue zetia.  If LDL remains elevated, consider re-trial low potency statin      Relevant Medications   hydrochlorothiazide (MICROZIDE) 12.5 MG capsule   Osteopenia    Reviewed dx and management with patient. Discussed dietary calcium intake.       TIA (transient ischemic attack) - Primary    Reviewed recent TIA vs migraine variant. Pt on aspirin 325mg  daily, zetia. Check FLP at CPE. Pt denies significant migraines recently, endorses headache from hypertension.       Relevant Medications   hydrochlorothiazide (MICROZIDE) 12.5 MG capsule    Other Visit Diagnoses   None.      Follow up plan: Return in about 6 weeks (around  11/28/2015), or as needed, for annual exam, prior fasting for blood work.  Ria Bush, MD

## 2015-10-17 NOTE — Assessment & Plan Note (Signed)
Reviewed dx and management with patient. Discussed dietary calcium intake.

## 2015-10-18 ENCOUNTER — Ambulatory Visit (HOSPITAL_COMMUNITY): Payer: Self-pay | Admitting: Psychiatry

## 2015-10-18 ENCOUNTER — Telehealth (HOSPITAL_COMMUNITY): Payer: Self-pay | Admitting: *Deleted

## 2015-10-18 NOTE — Telephone Encounter (Signed)
Pt had f/u for 10-18-15 due was rescheduled due to provider being out of office. Pt was rescheduled to f/u on 11-24-15. Pt need refills for her Xanax TID which was last filled on 08-18-15 with 90 tabs 2 refills, Lexapro BID 08-18-15 180 tabs 2 refills and Ambien CR QHS PRN 02-03-2015 with 90 tabs 2 refills. Pt pharmacy number is [phone] which is a mail order.

## 2015-10-18 NOTE — Telephone Encounter (Signed)
She has enough refills, these are 90 days except xanax which you may call in

## 2015-10-23 ENCOUNTER — Telehealth (HOSPITAL_COMMUNITY): Payer: Self-pay | Admitting: *Deleted

## 2015-10-23 MED ORDER — ALPRAZOLAM 1 MG PO TABS: 1.0000 mg | ORAL_TABLET | Freq: Three times a day (TID) | ORAL | 0 refills | Status: DC | PRN

## 2015-10-23 NOTE — Telephone Encounter (Signed)
Called in pt Xanax to pharmacy per Dr. Celso Sickle. Spoke with [employer]. 90 tabs 0 refills called in.

## 2015-10-23 NOTE — Telephone Encounter (Signed)
Called pt pharmacy and spoke with Langley Gauss per Dr. Celso Sickle. 90 tabs 0 refill

## 2015-10-25 ENCOUNTER — Other Ambulatory Visit: Payer: Self-pay | Admitting: Family Medicine

## 2015-10-26 ENCOUNTER — Telehealth (HOSPITAL_COMMUNITY): Payer: Self-pay | Admitting: *Deleted

## 2015-10-26 NOTE — Telephone Encounter (Signed)
She can take it once a day.

## 2015-10-26 NOTE — Telephone Encounter (Signed)
[phone]. [phone].   phone call from [place], with [facility].  Said the patient is in the heart failure program, and he had just spoken with her.   She told him she is taking Xanax three times a day and she stays sleepy all the time.   She asked if she needed to take three times a day.   He said she is now taking once a day.

## 2015-10-30 NOTE — Telephone Encounter (Signed)
Called Rolf RN with [facility] and lmtcb due to previous phone call. Office number provided

## 2015-10-31 NOTE — Telephone Encounter (Signed)
Called Rolf with [employer] due to previous phone call. lmtcb and number provided.

## 2015-11-02 ENCOUNTER — Encounter (HOSPITAL_COMMUNITY): Payer: Self-pay | Admitting: Radiology

## 2015-11-02 ENCOUNTER — Encounter: Payer: Self-pay | Admitting: Cardiology

## 2015-11-02 NOTE — Progress Notes (Signed)
Spoke with patient regarding echo appointment, location and no show fee

## 2015-11-06 ENCOUNTER — Ambulatory Visit (HOSPITAL_COMMUNITY): Payer: Medicare Other | Attending: Cardiovascular Disease

## 2015-11-06 DIAGNOSIS — I059 Rheumatic mitral valve disease, unspecified: Secondary | ICD-10-CM

## 2015-11-06 DIAGNOSIS — I350 Nonrheumatic aortic (valve) stenosis: Secondary | ICD-10-CM

## 2015-11-07 ENCOUNTER — Encounter: Payer: Self-pay | Admitting: Diagnostic Neuroimaging

## 2015-11-07 ENCOUNTER — Ambulatory Visit (INDEPENDENT_AMBULATORY_CARE_PROVIDER_SITE_OTHER): Payer: Medicare Other | Admitting: Diagnostic Neuroimaging

## 2015-11-07 VITALS — BP 137/82 | HR 74 | Wt 156.2 lb

## 2015-11-07 DIAGNOSIS — M5412 Radiculopathy, cervical region: Secondary | ICD-10-CM

## 2015-11-07 DIAGNOSIS — M79601 Pain in right arm: Secondary | ICD-10-CM | POA: Diagnosis not present

## 2015-11-07 DIAGNOSIS — G459 Transient cerebral ischemic attack, unspecified: Secondary | ICD-10-CM

## 2015-11-07 DIAGNOSIS — R269 Unspecified abnormalities of gait and mobility: Secondary | ICD-10-CM

## 2015-11-07 NOTE — Patient Instructions (Signed)
-   I will setup PT and OT evaluations  - consider pain management referral (discuss with Dr. Estanislado Pandy and Dr. Danise Mina)

## 2015-11-07 NOTE — Progress Notes (Signed)
[facility]  PATIENT: Alexandria Leach DOB: 1950-09-23  REFERRING CLINICIAN: Abel Presto Deveshwar HISTORY FROM: patient  REASON FOR VISIT: follow up    HISTORICAL  CHIEF COMPLAINT:  Chief Complaint  Patient presents with  . Pain    rm 7, R arm pain, (DDD), radiculopathy    HISTORY OF PRESENT ILLNESS:   UPDATE 11/07/15: Since last visit, in later Dec 2016, fell and had left ankle fracture and had ORIF. Then separately had increasing right arm pain around July 2017. Then on 10/12/15, had right face, arm numbness, slurred speech and right face drooping. Admitted for TIA workup. MRI negative for stroke, and TIA workup completed. Still with balance issues and right shoulder/arm pain.  UPDATE 02/22/15: Since last visit, has gone through PT and feels much better with balance. Numbness on right side is still present.   PRIOR HPI (11/18/14): 65 year old left-handed female with rheumatoid arthritis, fibromyalgia, Sjogren's syndrome, rheumatic heart disease, hypertension, depression, anxiety, here for evaluation of balance and gait difficulty. For past 1 year patient has had increasing balance and walking problems. She has fallen down 6 times. She also feels right-sided weakness and numbness. When patient stands up she feels okay, but when she starts to walk she feels like her legs do not do what she tells him to do. Patient has significant chronic pain in her neck, low back, arms and legs. She also has intermittent dizziness.   REVIEW OF SYSTEMS: Full 14 system review of systems performed and negative except for: memory loss, headache, difficulty swallowing joint pain fatigue fevers cramps joint pain.     ALLERGIES: Allergies  Allergen Reactions  . Cymbalta [Duloxetine Hcl] Other (See Comments)    tachycardia  . Statins Nausea Only and Other (See Comments)    Muscle cramps also  . Sulfa Antibiotics Nausea And Vomiting    HOME MEDICATIONS: Outpatient Medications Prior to  Visit  Medication Sig Dispense Refill  . ALPRAZolam (XANAX) 1 MG tablet Take 1 tablet (1 mg total) by mouth 3 (three) times daily as needed for anxiety. 90 tablet 0  . amLODipine (NORVASC) 10 MG tablet Take 1 tablet by mouth  daily 90 tablet 3  . antiseptic oral rinse (BIOTENE) LIQD 15 mLs by Mouth Rinse route at bedtime.     Marland Kitchen aspirin 325 MG EC tablet Take 325 mg by mouth daily.     . cholecalciferol (VITAMIN D) 1000 UNITS tablet Take 1,000 Units by mouth daily.     . cycloSPORINE (RESTASIS) 0.05 % ophthalmic emulsion Place 1 drop into both eyes 2 (two) times daily. 3 each 2  . diclofenac sodium (VOLTAREN) 1 % GEL Apply 1 application topically 2 (two) times daily as needed (pain).    Marland Kitchen escitalopram (LEXAPRO) 20 MG tablet Take 1 tablet (20 mg total) by mouth 2 (two) times daily. 180 tablet 2  . ezetimibe (ZETIA) 10 MG tablet Take 1 tablet (10 mg total) by mouth at bedtime. 90 tablet 3  . famotidine (PEPCID) 20 MG tablet Take 1 tablet by mouth two  times daily 180 tablet 3  . furosemide (LASIX) 20 MG tablet TAKE 1 TABLET BY MOUTH  DAILY AS NEEDED FOR EDEMA. (Patient taking differently: TAKE 1 TABLET BY MOUTH  DAILY AS NEEDED FOR WEIGHT GAIN OF 3 LBS IN 24 HOURS) 90 tablet 1  . gabapentin (NEURONTIN) 300 MG capsule Take 1 capsule by mouth 3  times a day 270 capsule 1  . hydrochlorothiazide (MICROZIDE) 12.5 MG capsule Take  1 capsule (12.5 mg total) by mouth daily. 90 capsule 1  . hydroxychloroquine (PLAQUENIL) 200 MG tablet Take 200 mg by mouth daily.     . metoprolol succinate (TOPROL-XL) 50 MG 24 hr tablet TAKE 1/2 TABLET IN THE  MORNING AND 1 TABLET AT  NIGHT TIME 135 tablet 0  . pilocarpine (SALAGEN) 5 MG tablet Take 2.5-5 mg by mouth 2 (two) times daily as needed. Take 1 tablet (5 mg) by mouth every morning and 1/2 tablet (2.5 mg) at 2-2:30pm if needed for Sjogren syndrome    . Polyethyl Glycol-Propyl Glycol (SYSTANE) 0.4-0.3 % GEL Place 1 drop into both eyes 2 (two) times daily.     Marland Kitchen  tiZANidine (ZANAFLEX) 4 MG tablet Take 1 tablet by mouth  every 8 hours as needed  muscle spasms 30 tablet 3  . traMADol (ULTRAM) 50 MG tablet Take 1 tablet (50 mg total) by mouth every 12 (twelve) hours as needed for severe pain. 30 tablet 3  . zolpidem (AMBIEN CR) 12.5 MG CR tablet Take 1 tablet (12.5 mg total) by mouth at bedtime as needed for sleep. 90 tablet 2  . ALPRAZolam (XANAX) 1 MG tablet Take 1 tablet (1 mg total) by mouth 3 (three) times daily. 90 tablet 2   No facility-administered medications prior to visit.     PAST MEDICAL HISTORY: Past Medical History:  Diagnosis Date  . Ankle fracture, left 02/23/2015  . Ankle fracture, left 02/19/2015   from a fall  . Anxiety   . CHF (congestive heart failure) ([place])   . CKD (chronic kidney disease) stage 3, GFR 30-59 ml/min    saw nephrologist Dr Harden Mo  . DDD (degenerative disc disease), cervical   . Depression   . Fibromyalgia   . Glaucoma    s/p surgery, sees ophtho Q6 mo  . 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 History of pulmonary embolism 2001, 2006   completed coumadin courses  . History of rheumatic fever x3  . HLD (hyperlipidemia)   . HTN (hypertension)   . Insomnia   . Lung nodule 09/22/2012   RLL - 62mm, stable since 2014. Thought benign.   . Osteoarthritis    shoulders and knees, not RA per Dr Estanislado Pandy, positive ANA, positive Ro  . Osteopenia 09/18/2015   DEXA T -1.1 hip, -0.2 spine 08/2015   . Personal history of urinary calculi latest 2014  . Pneumonia 12/02/2011  . PONV (postoperative nausea and vomiting)   . Refusal of blood transfusions as patient is Jehovah's Witness   . Rheumatic heart disease 1980   s/p mitral valve repair 1980  . Sjogren's syndrome ([place])     PAST SURGICAL HISTORY: Past Surgical History:  Procedure Laterality Date  . BREAST BIOPSY Right 2006   benign  . CHOLECYSTECTOMY  11/27/2011   Procedure: LAPAROSCOPIC  CHOLECYSTECTOMY WITH INTRAOPERATIVE CHOLANGIOGRAM;  Surgeon: Adin Hector, MD;  Location: [place];  Service: General;  Laterality: N/A;  laparoscopic cholecystectomy with choleangiogram umbilical hernia repair  . COLONOSCOPY  07/2014   WNL Amedeo Plenty)  . dexa  08/2012   normal per patient - no records available  . MITRAL VALVE REPAIR  1980   open heart  . ORIF ANKLE FRACTURE Left 02/26/2015   Procedure: OPEN REDUCTION INTERNAL FIXATION (ORIF) LEFT TRIMALLEOLAR ANKLE FRACTURE;  Surgeon: [name], MD;  Location: [place];  Service: Orthopedics;  Laterality: Left;  . TUBAL LIGATION  1980  .  UMBILICAL HERNIA REPAIR  11/27/2011   Procedure: HERNIA REPAIR UMBILICAL ADULT;  Surgeon: Adin Hector, MD;  Location: [place];  Service: General;  Laterality: N/A;  . [place]   for fibroids -- partial, ovaries remain    FAMILY HISTORY: Family History  Problem Relation Age of Onset  . Lupus Sister     and niece  . Cancer Mother     lung (nonsmoker)  . CAD Mother     MI in her 51s  . ALS Mother   . Kidney disease Father   . Alcohol abuse Father   . Diabetes Father   . Cancer Brother     bone  . Diabetes Sister   . Stroke Sister   . Cancer Maternal Uncle     bone  . Depression Sister   . Kidney failure Other     on HD  . Diabetes Brother   . Heart attack Brother   . Stroke Maternal Grandmother     SOCIAL HISTORY:  Social History   Social History  . Marital status: Married    Spouse name: [name]  . Number of children: 4  . Years of education: 12   Occupational History  .  Unemployed    Disability   Social History Main Topics  . Smoking status: Never Smoker  . Smokeless tobacco: Never Used  . Alcohol use No  . Drug use: No  . Sexual activity: Not Currently    Birth control/ protection: Surgical   Other Topics Concern  . Not on file   Social History Narrative   Lives with son, 1 dog   Occupation: unemployed, on disability for fibromyalgia since 2008.     Edu: HS   Religion: Jehova's witness   Activity: volunteers at senior center   Diet: some water, fruits/vegetables daily   No caffeine use     PHYSICAL EXAM  GENERAL EXAM/CONSTITUTIONAL: Vitals:  Vitals:   11/07/15 1305 11/07/15 1308  BP: 137/82 137/82  Pulse: 74 74  Weight: 156 lb 3.2 oz (70.9 kg)    Body mass index is 28.57 kg/m. No exam data present  Patient is in no distress; well developed, nourished and groomed; neck is supple   SOFT SPOKEN, HOARSE VOICE  MASKED FACIES; FLAT AFFECT  CARDIOVASCULAR:  Examination of carotid arteries is normal; no carotid bruits  Regular rate and rhythm, WITH FAINT SYSTOLIC MURMUR  Examination of peripheral vascular system by observation and palpation is normal  EYES:  Ophthalmoscopic exam of optic discs and posterior segments is normal; no papilledema or hemorrhages  MUSCULOSKELETAL:  Gait, strength, tone, movements noted in Neurologic exam below  NEUROLOGIC: MENTAL STATUS:  No flowsheet data found.  awake, alert, oriented to person, place and time  recent and remote memory intact  normal attention and concentration  language fluent, comprehension intact, naming intact,   fund of knowledge appropriate  CRANIAL NERVE:   2nd - no papilledema on fundoscopic exam  2nd, 3rd, 4th, 6th - pupils equal and reactive to light, visual fields full to confrontation, extraocular muscles intact, no nystagmus  5th - facial sensation symmetric  7th - facial strength symmetric  8th - hearing intact  9th - palate elevates symmetrically, uvula midline  11th - shoulder shrug symmetric  12th - tongue protrusion midline  MOTOR:   normal bulk and tone, full strength in the BUE; RLE HF SLIGHTLY LIMITED BY PAIN; LLE 5  SENSORY:   normal and symmetric to light touch  COORDINATION:   finger-nose-finger, fine finger movements SLOW BILATERALLY  REFLEXES:   deep tendon reflexes 1+ and symmetric; EXCEPT ABSENT AT  ANKLES  GAIT/STATION:   LIMPING GAIT DUE TO LEFT ANKLE DECR MOBILITY AND INCREASED PAIN     DIAGNOSTIC DATA (LABS, IMAGING, TESTING) - I reviewed patient records, labs, notes, testing and imaging myself where available.  Lab Results  Component Value Date   WBC 4.0 10/12/2015   HGB 12.7 10/12/2015   HCT 39.4 10/12/2015   MCV 82.8 10/12/2015   PLT 203 10/12/2015      Component Value Date/Time   NA 141 10/12/2015 1000   NA 140 12/14/2014   K 4.0 10/12/2015 1000   K 4.5 12/14/2014   CL 113 (H) 10/12/2015 1000   CO2 23 10/12/2015 1000   GLUCOSE 80 10/12/2015 1000   BUN 10 10/12/2015 1000   CREATININE 1.09 (H) 10/12/2015 1000   CREATININE 1.15 12/14/2014   CALCIUM 9.6 10/12/2015 1000   PROT 7.2 10/12/2015 1000   PROT 7.5 11/18/2014 1146   ALBUMIN 3.9 10/12/2015 1000   AST 27 10/12/2015 1000   AST 46 12/14/2014   ALT 15 10/12/2015 1000   ALT 38 12/14/2014   ALKPHOS 55 10/12/2015 1000   ALKPHOS 75 12/14/2014   BILITOT 0.6 10/12/2015 1000   BILITOT 0.4 12/14/2014   GFRNONAA 53 (L) 10/12/2015 1000   GFRAA >60 10/12/2015 1000   Lab Results  Component Value Date   CHOL 222 (H) 10/13/2015   HDL 86 10/13/2015   LDLCALC 125 (H) 10/13/2015   TRIG 54 10/13/2015   CHOLHDL 2.6 10/13/2015   Lab Results  Component Value Date   HGBA1C 5.6 10/13/2015   Lab Results  Component Value Date   [id number] 11/18/2014   Lab Results  Component Value Date   TSH 1.860 11/18/2014    10/10/14 KNEE XRAY [I reviewed images myself and agree with interpretation. -VRP]  - Mild soft tissue swelling over the right patella. No fracture, subluxation or dislocation. No joint effusion. Mild degenerative changes with spurring in all 3 compartments and slight joint space narrowing in the medial compartment.  - No acute bony abnormality.  12/09/14 MRI brain (with and without) [I reviewed images myself and agree with interpretation. -VRP] g 1. Mild scattered periventricular, juxtacortical,  subcortical and pontine chronic small vessel ischemic disease. 2. No abnormal enhancing lesions. 3. No acute findings.   12/09/14 MRI cervical spine (without) [I reviewed images myself and agree with interpretation. -VRP]  1. At C5-6: uncovertebral joint hypertrophy with moderate right foraminal stenosis. 2. At C6-7: disc bulging with mild left foraminal stenosis. 3. No intrinsic or compressive spinal cord lesions. 4. Incidental left thyroid cyst (1.3 cm).     ASSESSMENT AND PLAN  65 y.o. year old female here with one year history of gait and balance difficulty. She has fallen 6 times. History and exam notable for sensory and motor deficits on the right side. Additional testing ruled out secondary causes. Patient also has significant chronic pain, chronic rheumatologic disease which may be contributory to her gait and balance difficulty. Also with right shoulder pain. Also with possible TIA in July 2017.    Ddx: neuropathy (sjogren's neuropathy vs idiopathic), cervical radiculopathy, right shoulder arthritis, lumbar radiculopathy, chronic pain syndrome  Right cervical radiculopathy  Right arm pain  Gait difficulty    PLAN: - trial of OT (for right shoulder pain) and PT (for balance issues) - consider referral to pain mgmt (for cervical epidural injection  or other treatments)  Orders Placed This Encounter  Procedures  . Ambulatory referral to Physical Therapy  . Ambulatory referral to Occupational Therapy   Return if symptoms worsen or fail to improve, for return to PCP.    Penni Bombard, MD [id number], 0000000 PM Certified in Neurology, Neurophysiology and Neuroimaging  [facility] [address], [place], [place] [address] [phone]

## 2015-11-14 DIAGNOSIS — S82853A Displaced trimalleolar fracture of unspecified lower leg, initial encounter for closed fracture: Secondary | ICD-10-CM | POA: Diagnosis not present

## 2015-11-14 NOTE — Progress Notes (Signed)
HPI: FU mitral valve repair secondary to rheumatic heart disease. Patient is status post mitral valve repair in 1980. Operative report not available. Nuclear study 11/14 showed EF 66 and normal perfusion. Holter 11/14 showed sinus with pacs, pvcs and brief PAT. Last echo 7/17 showed normal LV systolic function, grade 2 diastolic dysfunction, mild AS/AI, s/p MV repair, mild MS (mean gradient 6 mmHg); mild to moderate MR, moderate LAE. Patient was admitted in July with right upper extremity numbness. MRI/MRA negative. Since last seen, She denies dyspnea, chest pain, palpitations or syncope.  Current Outpatient Prescriptions  Medication Sig Dispense Refill  . ALPRAZolam (XANAX) 1 MG tablet Take 1 tablet (1 mg total) by mouth 3 (three) times daily as needed for anxiety. 90 tablet 0  . amLODipine (NORVASC) 10 MG tablet Take 1 tablet by mouth  daily 90 tablet 3  . antiseptic oral rinse (BIOTENE) LIQD 15 mLs by Mouth Rinse route at bedtime.     Marland Kitchen aspirin 325 MG EC tablet Take 325 mg by mouth daily.     . cholecalciferol (VITAMIN D) 1000 UNITS tablet Take 1,000 Units by mouth daily.     . cycloSPORINE (RESTASIS) 0.05 % ophthalmic emulsion Place 1 drop into both eyes 2 (two) times daily. 3 each 2  . diclofenac sodium (VOLTAREN) 1 % GEL Apply 1 application topically 2 (two) times daily as needed (pain).    Marland Kitchen escitalopram (LEXAPRO) 20 MG tablet Take 1 tablet (20 mg total) by mouth 2 (two) times daily. 180 tablet 2  . ezetimibe (ZETIA) 10 MG tablet Take 1 tablet (10 mg total) by mouth at bedtime. 90 tablet 3  . famotidine (PEPCID) 20 MG tablet Take 1 tablet by mouth two  times daily 180 tablet 3  . furosemide (LASIX) 20 MG tablet TAKE 1 TABLET BY MOUTH  DAILY AS NEEDED FOR EDEMA. (Patient taking differently: TAKE 1 TABLET BY MOUTH  DAILY AS NEEDED FOR WEIGHT GAIN OF 3 LBS IN 24 HOURS) 90 tablet 1  . gabapentin (NEURONTIN) 300 MG capsule Take 1 capsule by mouth 3  times a day 270 capsule 1  .  hydrochlorothiazide (MICROZIDE) 12.5 MG capsule Take 1 capsule (12.5 mg total) by mouth daily. 90 capsule 1  . hydroxychloroquine (PLAQUENIL) 200 MG tablet Take 200 mg by mouth daily.     . metoprolol succinate (TOPROL-XL) 50 MG 24 hr tablet TAKE 1/2 TABLET IN THE  MORNING AND 1 TABLET AT  NIGHT TIME 135 tablet 0  . pilocarpine (SALAGEN) 5 MG tablet Take 2.5-5 mg by mouth 2 (two) times daily as needed. Take 1 tablet (5 mg) by mouth every morning and 1/2 tablet (2.5 mg) at 2-2:30pm if needed for Sjogren syndrome    . Polyethyl Glycol-Propyl Glycol (SYSTANE) 0.4-0.3 % GEL Place 1 drop into both eyes 2 (two) times daily.     Marland Kitchen tiZANidine (ZANAFLEX) 4 MG tablet Take 1 tablet by mouth  every 8 hours as needed  muscle spasms 30 tablet 3  . traMADol (ULTRAM) 50 MG tablet Take 1 tablet (50 mg total) by mouth every 12 (twelve) hours as needed for severe pain. 30 tablet 3  . zolpidem (AMBIEN CR) 12.5 MG CR tablet Take 1 tablet (12.5 mg total) by mouth at bedtime as needed for sleep. 90 tablet 2   No current facility-administered medications for this visit.      Past Medical History:  Diagnosis Date  . Ankle fracture, left 02/23/2015  . Ankle fracture, left  02/19/2015   from a fall  . Anxiety   . CHF (congestive heart failure) ([place])   . CKD (chronic kidney disease) stage 3, GFR 30-59 ml/min    saw nephrologist Dr Harden Mo  . DDD (degenerative disc disease), cervical   . Depression   . Fibromyalgia   . Glaucoma    s/p surgery, sees ophtho Q6 mo  . 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 History of pulmonary embolism 2001, 2006   completed coumadin courses  . History of rheumatic fever x3  . HLD (hyperlipidemia)   . HTN (hypertension)   . Insomnia   . Lung nodule 09/22/2012   RLL - 37mm, stable since 2014. Thought benign.   . Osteoarthritis    shoulders and knees, not RA per Dr Estanislado Pandy, positive ANA, positive Ro  . Osteopenia  09/18/2015   DEXA T -1.1 hip, -0.2 spine 08/2015   . Personal history of urinary calculi latest 2014  . Pneumonia 12/02/2011  . PONV (postoperative nausea and vomiting)   . Refusal of blood transfusions as patient is Jehovah's Witness   . Rheumatic heart disease 1980   s/p mitral valve repair 1980  . Sjogren's syndrome [facility])     Past Surgical History:  Procedure Laterality Date  . BREAST BIOPSY Right 2006   benign  . CHOLECYSTECTOMY  11/27/2011   Procedure: LAPAROSCOPIC CHOLECYSTECTOMY WITH INTRAOPERATIVE CHOLANGIOGRAM;  Surgeon: Adin Hector, MD;  Location: [place] [facility];  Service: General;  Laterality: N/A;  laparoscopic cholecystectomy with choleangiogram umbilical hernia repair  . COLONOSCOPY  07/2014   WNL Amedeo Plenty)  . dexa  08/2012   normal per patient - no records available  . MITRAL VALVE REPAIR  1980   open heart  . ORIF ANKLE FRACTURE Left 02/26/2015   Procedure: OPEN REDUCTION INTERNAL FIXATION (ORIF) LEFT TRIMALLEOLAR ANKLE FRACTURE;  Surgeon: [name], MD;  Location: [facility];  Service: Orthopedics;  Laterality: Left;  . TUBAL LIGATION  1980  . UMBILICAL HERNIA REPAIR  11/27/2011   Procedure: HERNIA REPAIR UMBILICAL ADULT;  Surgeon: Adin Hector, MD;  Location: [place] [facility];  Service: General;  Laterality: N/A;  . [place]   for fibroids -- partial, ovaries remain    Social History   Social History  . Marital status: Married    Spouse name: [name]  . Number of children: 4  . Years of education: 12   Occupational History  .  Unemployed    Disability   Social History Main Topics  . Smoking status: Never Smoker  . Smokeless tobacco: Never Used  . Alcohol use No  . Drug use: No  . Sexual activity: Not Currently    Birth control/ protection: Surgical   Other Topics Concern  . Not on file   Social History Narrative   Lives with son, 1 dog   Occupation: unemployed, on disability for fibromyalgia since 2008.   Edu: HS   Religion: Jehova's witness    Activity: volunteers at senior center   Diet: some water, fruits/vegetables daily   No caffeine use    Family History  Problem Relation Age of Onset  . Lupus Sister     and niece  . Cancer Mother     lung (nonsmoker)  . CAD Mother     MI in her 3s  . ALS Mother   . Kidney disease Father   . Alcohol abuse Father   .  Diabetes Father   . Cancer Brother     bone  . Diabetes Sister   . Stroke Sister   . Cancer Maternal Uncle     bone  . Depression Sister   . Kidney failure Other     on HD  . Diabetes Brother   . Heart attack Brother   . Stroke Maternal Grandmother     ROS: no fevers or chills, productive cough, hemoptysis, dysphasia, odynophagia, melena, hematochezia, dysuria, hematuria, rash, seizure activity, orthopnea, PND, pedal edema, claudication. Remaining systems are negative.  Physical Exam: Well-developed well-nourished in no acute distress.  Skin is warm and dry.  HEENT is normal.  Neck is supple.  Chest is clear to auscultation with normal expansion.  Cardiovascular exam is regular rate and rhythm. 2/6 systolic murmur apex Abdominal exam nontender or distended. No masses palpated. Extremities show no edema. neuro grossly intact  ECG  A/P  1 Hyperlipidemia-management per primary care.  2 hypertension-blood pressure is elevated. Increase Toprol to 50 mg twice a day and follow.  3 history of mitral valve repair-continue SBE prophylaxis. She will need follow-up echocardiograms in the future.  4 palpitations-continue beta blocker.  5 diastolic congestive heart failure chronic-continue present dose of diuretics. Euvolemic on examination.  Kirk Ruths, MD

## 2015-11-16 ENCOUNTER — Encounter: Payer: Self-pay | Admitting: Cardiology

## 2015-11-16 ENCOUNTER — Ambulatory Visit (INDEPENDENT_AMBULATORY_CARE_PROVIDER_SITE_OTHER): Payer: Medicare Other | Admitting: Cardiology

## 2015-11-16 VITALS — BP 148/76 | HR 70 | Ht 60.0 in | Wt 154.0 lb

## 2015-11-16 DIAGNOSIS — E785 Hyperlipidemia, unspecified: Secondary | ICD-10-CM | POA: Diagnosis not present

## 2015-11-16 DIAGNOSIS — I059 Rheumatic mitral valve disease, unspecified: Secondary | ICD-10-CM

## 2015-11-16 DIAGNOSIS — R002 Palpitations: Secondary | ICD-10-CM | POA: Diagnosis not present

## 2015-11-16 DIAGNOSIS — I10 Essential (primary) hypertension: Secondary | ICD-10-CM

## 2015-11-16 MED ORDER — METOPROLOL SUCCINATE ER 50 MG PO TB24: 50.0000 mg | ORAL_TABLET | Freq: Two times a day (BID) | ORAL | 3 refills | Status: DC

## 2015-11-16 NOTE — Patient Instructions (Signed)
Medication Instructions:  Your physician has recommended you make the following change in your medication:  1- CHANGE YOUR Toprol-XL to 50 mg (1 tablet) by mouth 2 times per day.   Follow-Up: Your physician wants you to follow-up in: [place]. You will receive a reminder letter in the mail two months in advance. If you don't receive a letter, please call our office to schedule the follow-up appointment.   If you need a refill on your cardiac medications before your next appointment, please call your pharmacy.

## 2015-11-22 DIAGNOSIS — M25562 Pain in left knee: Secondary | ICD-10-CM | POA: Diagnosis not present

## 2015-11-22 DIAGNOSIS — G4709 Other insomnia: Secondary | ICD-10-CM | POA: Diagnosis not present

## 2015-11-22 DIAGNOSIS — M797 Fibromyalgia: Secondary | ICD-10-CM | POA: Diagnosis not present

## 2015-11-22 DIAGNOSIS — M3500 Sicca syndrome, unspecified: Secondary | ICD-10-CM | POA: Diagnosis not present

## 2015-11-22 DIAGNOSIS — Z09 Encounter for follow-up examination after completed treatment for conditions other than malignant neoplasm: Secondary | ICD-10-CM | POA: Diagnosis not present

## 2015-11-24 ENCOUNTER — Telehealth: Payer: Self-pay | Admitting: Cardiology

## 2015-11-24 ENCOUNTER — Ambulatory Visit (INDEPENDENT_AMBULATORY_CARE_PROVIDER_SITE_OTHER): Payer: Medicare Other | Admitting: Psychiatry

## 2015-11-24 ENCOUNTER — Encounter (HOSPITAL_COMMUNITY): Payer: Self-pay | Admitting: Psychiatry

## 2015-11-24 VITALS — BP 122/80 | HR 78 | Ht 60.0 in | Wt 156.0 lb

## 2015-11-24 DIAGNOSIS — F331 Major depressive disorder, recurrent, moderate: Secondary | ICD-10-CM | POA: Diagnosis not present

## 2015-11-24 MED ORDER — ALPRAZOLAM 1 MG PO TABS: 1.0000 mg | ORAL_TABLET | Freq: Three times a day (TID) | ORAL | 1 refills | Status: DC | PRN

## 2015-11-24 MED ORDER — ESCITALOPRAM OXALATE 20 MG PO TABS: 20.0000 mg | ORAL_TABLET | Freq: Two times a day (BID) | ORAL | 2 refills | Status: DC

## 2015-11-24 NOTE — Telephone Encounter (Signed)
Outgoing call made to [facility] Case manager. Left msg w/ results and recommended to call back if concerns or questions.
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
## 2015-11-24 NOTE — Telephone Encounter (Signed)
What was pt last ejection fraction from her last echo?

## 2015-11-28 ENCOUNTER — Ambulatory Visit (INDEPENDENT_AMBULATORY_CARE_PROVIDER_SITE_OTHER): Payer: Medicare Other | Admitting: Family Medicine

## 2015-11-28 ENCOUNTER — Encounter: Payer: Self-pay | Admitting: Family Medicine

## 2015-11-28 VITALS — BP 118/68 | HR 76 | Temp 98.3°F | Wt 154.5 lb

## 2015-11-28 DIAGNOSIS — E7800 Pure hypercholesterolemia, unspecified: Secondary | ICD-10-CM | POA: Diagnosis not present

## 2015-11-28 DIAGNOSIS — I10 Essential (primary) hypertension: Secondary | ICD-10-CM

## 2015-11-28 DIAGNOSIS — W19XXXA Unspecified fall, initial encounter: Secondary | ICD-10-CM

## 2015-11-28 LAB — LIPID PANEL
CHOLESTEROL: 222 mg/dL — AB (ref 0–200)
HDL: 91.4 mg/dL (ref >39.00)
LDL Cholesterol: 116 mg/dL — ABNORMAL HIGH (ref 0–99)
NonHDL: 130.3
TRIGLYCERIDES: 74 mg/dL (ref 0.0–149.0)
Total CHOL/HDL Ratio: 2
VLDL: 14.8 mg/dL (ref 0.0–40.0)

## 2015-11-28 LAB — BASIC METABOLIC PANEL
BUN: 15 mg/dL (ref 6–23)
CHLORIDE: 105 meq/L (ref 96–112)
CO2: 32 mEq/L (ref 19–32)
Calcium: 9.7 mg/dL (ref 8.4–10.5)
Creatinine, Ser: 1.13 mg/dL (ref 0.40–1.20)
GFR: 62.47 mL/min (ref >60.00)
GLUCOSE: 94 mg/dL (ref 70–99)
POTASSIUM: 4.5 meq/L (ref 3.5–5.1)
SODIUM: 141 meq/L (ref 135–145)

## 2015-11-28 NOTE — Progress Notes (Signed)
BP 118/68   Pulse 76   Temp 98.3 F (36.8 C) (Oral)   Wt 154 lb 8 oz (70.1 kg)   BMI 30.17 kg/m    CC: 6 wk f/u visit Subjective:    Patient ID: Alexandria Leach, female    DOB: [birth year], 65 y.o.   MRN: [id number]  HPI: Alexandria Leach is a 65 y.o. female presenting on 11/28/2015 for Follow-up   HTN - Compliant with current antihypertensive regimen of amlodipine 10mg  daily with metoprolol XL 50mg  bid, last visit started hctz 12.5mg  daily. Due for labs. Does check blood pressures at home: well controlled. No low blood pressure readings or symptoms of dizziness/syncope. Denies HA, vision changes, CP/tightness, SOB, leg swelling.   Had another fall on Saturday then fluid removed from knee. Fell off blue box when closing curtains.   Saw cardiology and neurology since my last visit - has been referred to PT/OT (12/13/2015).   Relevant past medical, surgical, family and social history reviewed and updated as indicated. Interim medical history since our last visit reviewed. Allergies and medications reviewed and updated. Current Outpatient Prescriptions on File Prior to Visit  Medication Sig  . ALPRAZolam (XANAX) 1 MG tablet Take 1 tablet (1 mg total) by mouth 3 (three) times daily as needed for anxiety.  Marland Kitchen amLODipine (NORVASC) 10 MG tablet Take 1 tablet by mouth  daily  . antiseptic oral rinse (BIOTENE) LIQD 15 mLs by Mouth Rinse route at bedtime.   Marland Kitchen aspirin 325 MG EC tablet Take 325 mg by mouth daily.   . cholecalciferol (VITAMIN D) 1000 UNITS tablet Take 1,000 Units by mouth daily.   . cycloSPORINE (RESTASIS) 0.05 % ophthalmic emulsion Place 1 drop into both eyes 2 (two) times daily.  . diclofenac sodium (VOLTAREN) 1 % GEL Apply 1 application topically 2 (two) times daily as needed (pain).  Marland Kitchen escitalopram (LEXAPRO) 20 MG tablet Take 1 tablet (20 mg total) by mouth 2 (two) times daily.  Marland Kitchen ezetimibe (ZETIA) 10 MG tablet Take 1 tablet (10 mg total) by mouth at bedtime.  . famotidine  (PEPCID) 20 MG tablet Take 1 tablet by mouth two  times daily  . furosemide (LASIX) 20 MG tablet TAKE 1 TABLET BY MOUTH  DAILY AS NEEDED FOR EDEMA. (Patient taking differently: TAKE 1 TABLET BY MOUTH  DAILY AS NEEDED FOR WEIGHT GAIN OF 3 LBS IN 24 HOURS)  . gabapentin (NEURONTIN) 300 MG capsule Take 1 capsule by mouth 3  times a day  . hydrochlorothiazide (MICROZIDE) 12.5 MG capsule Take 1 capsule (12.5 mg total) by mouth daily.  . hydroxychloroquine (PLAQUENIL) 200 MG tablet Take 200 mg by mouth daily.   . metoprolol succinate (TOPROL-XL) 50 MG 24 hr tablet Take 1 tablet (50 mg total) by mouth 2 (two) times daily. Take with or immediately following a meal.  . pilocarpine (SALAGEN) 5 MG tablet Take 2.5-5 mg by mouth 2 (two) times daily as needed. Take 1 tablet (5 mg) by mouth every morning and 1/2 tablet (2.5 mg) at 2-2:30pm if needed for Sjogren syndrome  . Polyethyl Glycol-Propyl Glycol (SYSTANE) 0.4-0.3 % GEL Place 1 drop into both eyes 2 (two) times daily.   . traMADol (ULTRAM) 50 MG tablet Take 1 tablet (50 mg total) by mouth every 12 (twelve) hours as needed for severe pain.  Marland Kitchen zolpidem (AMBIEN CR) 12.5 MG CR tablet Take 1 tablet (12.5 mg total) by mouth at bedtime as needed for sleep.   No current facility-administered medications  on file prior to visit.     Review of Systems Per HPI unless specifically indicated in ROS section     Objective:    BP 118/68   Pulse 76   Temp 98.3 F (36.8 C) (Oral)   Wt 154 lb 8 oz (70.1 kg)   BMI 30.17 kg/m   Wt Readings from Last 3 Encounters:  11/28/15 154 lb 8 oz (70.1 kg)  11/24/15 156 lb (70.8 kg)  11/16/15 154 lb (69.9 kg)    Physical Exam  Constitutional: She appears well-developed and well-nourished. No distress.  HENT:  Mouth/Throat: Oropharynx is clear and moist. No oropharyngeal exudate.  Eyes: Conjunctivae are normal. Pupils are equal, round, and reactive to light.  Neck: Normal range of motion. Neck supple.  Cardiovascular:  Normal rate, regular rhythm, normal heart sounds and intact distal pulses.   No murmur heard. Pulmonary/Chest: Effort normal and breath sounds normal. No respiratory distress. She has no wheezes. She has no rales.  Musculoskeletal: She exhibits no edema.  Skin: Skin is warm and dry. No rash noted.  Nursing note and vitals reviewed.      Assessment & Plan:   Problem List Items Addressed This Visit    Essential hypertension - Primary    Chronic, stable. Better control on current regimen. Continue.       Relevant Orders   Basic metabolic panel   Fall    rec avoid climbing objects, encouraged caution with ambulation.      HYPERCHOLESTEROLEMIA    Statin intolerance. Reports compliance with zetia. Recheck FLP. Consider retrial of low potency statin.      Relevant Orders   Lipid panel    Other Visit Diagnoses   None.      Follow up plan: No Follow-up on file.  Ria Bush, MD

## 2015-11-28 NOTE — Assessment & Plan Note (Signed)
Chronic, stable. Better control on current regimen. Continue.

## 2015-11-28 NOTE — Assessment & Plan Note (Signed)
rec avoid climbing objects, encouraged caution with ambulation.

## 2015-11-28 NOTE — Assessment & Plan Note (Signed)
Statin intolerance. Reports compliance with zetia. Recheck FLP. Consider retrial of low potency statin.

## 2015-11-28 NOTE — Patient Instructions (Addendum)
Labs today.  Return in next few months for a physical. Keep appointment on Thursday for medicare wellness visit. Good to see you today, call us with questions.

## 2015-11-28 NOTE — Progress Notes (Signed)
Pre visit review using our clinic review tool, if applicable. No additional management support is needed unless otherwise documented below in the visit note. 

## 2015-11-29 ENCOUNTER — Other Ambulatory Visit: Payer: Self-pay | Admitting: Family Medicine

## 2015-11-29 DIAGNOSIS — E7800 Pure hypercholesterolemia, unspecified: Secondary | ICD-10-CM

## 2015-11-29 MED ORDER — LOVASTATIN 10 MG PO TABS: 10.0000 mg | ORAL_TABLET | Freq: Every day | ORAL | 6 refills | Status: DC

## 2015-11-30 ENCOUNTER — Ambulatory Visit (INDEPENDENT_AMBULATORY_CARE_PROVIDER_SITE_OTHER): Payer: Medicare Other

## 2015-11-30 ENCOUNTER — Other Ambulatory Visit (INDEPENDENT_AMBULATORY_CARE_PROVIDER_SITE_OTHER): Payer: Medicare Other

## 2015-11-30 VITALS — BP 110/70 | HR 71 | Temp 97.9°F | Ht 62.0 in | Wt 152.5 lb

## 2015-11-30 DIAGNOSIS — Z114 Encounter for screening for human immunodeficiency virus [HIV]: Secondary | ICD-10-CM | POA: Diagnosis not present

## 2015-11-30 DIAGNOSIS — Z0000 Encounter for general adult medical examination without abnormal findings: Secondary | ICD-10-CM | POA: Diagnosis not present

## 2015-11-30 NOTE — Patient Instructions (Addendum)
Alexandria Leach , Thank you for taking time to come for your Medicare Wellness Visit. I appreciate your ongoing commitment to your health goals. Please review the following plan we discussed and let me know if I can assist you in the future.   These are the goals we discussed: Goals    . Increase physical activity          Starting 11/30/2015, I will continue to walk at least 45 min 3 days per week.        This is a list of the screening recommended for you and due dates:  Health Maintenance  Topic Date Due  . Pap Smear  12/08/2015*  . DTaP/Tdap/Td vaccine (1 - Tdap) 11/29/2025*  . Flu Shot  11/29/2025*  . Shingles Vaccine  11/29/2025*  . Tetanus Vaccine  11/29/2025*  . Mammogram  08/16/2017  . Colon Cancer Screening  07/26/2024  .  Hepatitis C: One time screening is recommended by Center for Disease Control  (CDC) for  adults born from [birth year] through 1963.   Completed  . HIV Screening  Completed  *Topic was postponed. The date shown is not the original due date.   Preventive Care for Adults  A healthy lifestyle and preventive care can promote health and wellness. Preventive health guidelines for adults include the following key practices.  . A routine yearly physical is a good way to check with your health care provider about your health and preventive screening. It is a chance to share any concerns and updates on your health and to receive a thorough exam.  . Visit your dentist for a routine exam and preventive care every 6 months. Brush your teeth twice a day and floss once a day. Good oral hygiene prevents tooth decay and gum disease.  . The frequency of eye exams is based on your age, health, family medical history, use  of contact lenses, and other factors. Follow your health care provider's ecommendations for frequency of eye exams.  . Eat a healthy diet. Foods like vegetables, fruits, whole grains, low-fat dairy products, and lean protein foods contain the nutrients you need  without too many calories. Decrease your intake of foods high in solid fats, added sugars, and salt. Eat the right amount of calories for you. Get information about a proper diet from your health care provider, if necessary.  . Regular physical exercise is one of the most important things you can do for your health. Most adults should get at least 150 minutes of moderate-intensity exercise (any activity that increases your heart rate and causes you to sweat) each week. In addition, most adults need muscle-strengthening exercises on 2 or more days a week.  Silver Sneakers may be a benefit available to you. To determine eligibility, you may visit the website: www.silversneakers.com or contact program at (828)626-5709 Mon-Fri between 8AM-8PM.   . Maintain a healthy weight. The body mass index (BMI) is a screening tool to identify possible weight problems. It provides an estimate of body fat based on height and weight. Your health care provider can find your BMI and can help you achieve or maintain a healthy weight.   For adults 20 years and older: ? A BMI below 18.5 is considered underweight. ? A BMI of 18.5 to 24.9 is normal. ? A BMI of 25 to 29.9 is considered overweight. ? A BMI of 30 and above is considered obese.   . Maintain normal blood lipids and cholesterol levels by exercising and minimizing your intake  of saturated fat. Eat a balanced diet with plenty of fruit and vegetables. Blood tests for lipids and cholesterol should begin at age 24 and be repeated every 5 years. If your lipid or cholesterol levels are high, you are over 50, or you are at high risk for heart disease, you may need your cholesterol levels checked more frequently. Ongoing high lipid and cholesterol levels should be treated with medicines if diet and exercise are not working.  . If you smoke, find out from your health care provider how to quit. If you do not use tobacco, please do not start.  . If you choose to drink  alcohol, please do not consume more than 2 drinks per day. One drink is considered to be 12 ounces (355 mL) of beer, 5 ounces (148 mL) of wine, or 1.5 ounces (44 mL) of liquor.  . If you are 53-60 years old, ask your health care provider if you should take aspirin to prevent strokes.  . Use sunscreen. Apply sunscreen liberally and repeatedly throughout the day. You should seek shade when your shadow is shorter than you. Protect yourself by wearing long sleeves, pants, a wide-brimmed hat, and sunglasses year round, whenever you are outdoors.  . Once a month, do a whole body skin exam, using a mirror to look at the skin on your back. Tell your health care provider of new moles, moles that have irregular borders, moles that are larger than a pencil eraser, or moles that have changed in shape or color.      Fall Prevention in the Home  Falls can cause injuries. They can happen to people of all ages. There are many things you can do to make your home safe and to help prevent falls.  WHAT CAN I DO ON THE OUTSIDE OF MY HOME?  Regularly fix the edges of walkways and driveways and fix any cracks.  Remove anything that might make you trip as you walk through a door, such as a raised step or threshold.  Trim any bushes or trees on the path to your home.  Use bright outdoor lighting.  Clear any walking paths of anything that might make someone trip, such as rocks or tools.  Regularly check to see if handrails are loose or broken. Make sure that both sides of any steps have handrails.  Any raised decks and porches should have guardrails on the edges.  Have any leaves, snow, or ice cleared regularly.  Use sand or salt on walking paths during winter.  Clean up any spills in your garage right away. This includes oil or grease spills. WHAT CAN I DO IN THE BATHROOM?   Use night lights.  Install grab bars by the toilet and in the tub and shower. Do not use towel bars as grab bars.  Use non-skid  mats or decals in the tub or shower.  If you need to sit down in the shower, use a plastic, non-slip stool.  Keep the floor dry. Clean up any water that spills on the floor as soon as it happens.  Remove soap buildup in the tub or shower regularly.  Attach bath mats securely with double-sided non-slip rug tape.  Do not have throw rugs and other things on the floor that can make you trip. WHAT CAN I DO IN THE BEDROOM?  Use night lights.  Make sure that you have a light by your bed that is easy to reach.  Do not use any sheets or blankets that  are too big for your bed. They should not hang down onto the floor.  Have a firm chair that has side arms. You can use this for support while you get dressed.  Do not have throw rugs and other things on the floor that can make you trip. WHAT CAN I DO IN THE KITCHEN?  Clean up any spills right away.  Avoid walking on wet floors.  Keep items that you use a lot in easy-to-reach places.  If you need to reach something above you, use a strong step stool that has a grab bar.  Keep electrical cords out of the way.  Do not use floor polish or wax that makes floors slippery. If you must use wax, use non-skid floor wax.  Do not have throw rugs and other things on the floor that can make you trip. WHAT CAN I DO WITH MY STAIRS?  Do not leave any items on the stairs.  Make sure that there are handrails on both sides of the stairs and use them. Fix handrails that are broken or loose. Make sure that handrails are as long as the stairways.  Check any carpeting to make sure that it is firmly attached to the stairs. Fix any carpet that is loose or worn.  Avoid having throw rugs at the top or bottom of the stairs. If you do have throw rugs, attach them to the floor with carpet tape.  Make sure that you have a light switch at the top of the stairs and the bottom of the stairs. If you do not have them, ask someone to add them for you. WHAT ELSE CAN I  DO TO HELP PREVENT FALLS?  Wear shoes that:  Do not have high heels.  Have rubber bottoms.  Are comfortable and fit you well.  Are closed at the toe. Do not wear sandals.  If you use a stepladder:  Make sure that it is fully opened. Do not climb a closed stepladder.  Make sure that both sides of the stepladder are locked into place.  Ask someone to hold it for you, if possible.  Clearly mark and make sure that you can see:  Any grab bars or handrails.  First and last steps.  Where the edge of each step is.  Use tools that help you move around (mobility aids) if they are needed. These include:  Canes.  Walkers.  Scooters.  Crutches.  Turn on the lights when you go into a dark area. Replace any light bulbs as soon as they burn out.  Set up your furniture so you have a clear path. Avoid moving your furniture around.  If any of your floors are uneven, fix them.  If there are any pets around you, be aware of where they are.  Review your medicines with your doctor. Some medicines can make you feel dizzy. This can increase your chance of falling. Ask your doctor what other things that you can do to help prevent falls.   This information is not intended to replace advice given to you by your health care provider. Make sure you discuss any questions you have with your health care provider.   Document Released: 12/29/2008 Document Revised: 07/19/2014 Document Reviewed: 04/08/2014 [employer]vier Interactive Patient Education [employer].

## 2015-11-30 NOTE — Progress Notes (Signed)
Subjective:   Alexandria Leach is a 65 y.o. female who presents for Medicare Annual (Subsequent) preventive examination.  Review of Systems:  N/A Cardiac Risk Factors include: advanced age (>68men, >31 women);dyslipidemia;hypertension     Objective:     Vitals: BP 110/70 (BP Location: Left Arm, Patient Position: Sitting, Cuff Size: Normal)   Pulse 71   Temp 97.9 F (36.6 C) (Oral)   Ht 5\' 2"  (1.575 m) Comment: no shoes  Wt 152 lb 8 oz (69.2 kg)   SpO2 98%   BMI 27.89 kg/m   Body mass index is 27.89 kg/m.   Tobacco History  Smoking Status  . Never Smoker  Smokeless Tobacco  . Never Used     Counseling given: No   Past Medical History:  Diagnosis Date  . Ankle fracture, left 02/23/2015  . Ankle fracture, left 02/19/2015   from a fall  . Anxiety   . CHF (congestive heart failure) ([place])   . CKD (chronic kidney disease) stage 3, GFR 30-59 ml/min    saw nephrologist Dr Harden Mo  . DDD (degenerative disc disease), cervical   . Depression   . Fibromyalgia   . Glaucoma    s/p surgery, sees ophtho Q6 mo  . 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 History of pulmonary embolism 2001, 2006   completed coumadin courses  . History of rheumatic fever x3  . HLD (hyperlipidemia)   . HTN (hypertension)   . Insomnia   . Lung nodule 09/22/2012   RLL - 36mm, stable since 2014. Thought benign.   . Osteoarthritis    shoulders and knees, not RA per Dr Estanislado Pandy, positive ANA, positive Ro  . Osteopenia 09/18/2015   DEXA T -1.1 hip, -0.2 spine 08/2015   . Personal history of urinary calculi latest 2014  . Pneumonia 12/02/2011  . PONV (postoperative nausea and vomiting)   . Refusal of blood transfusions as patient is Jehovah's Witness   . Rheumatic heart disease 1980   s/p mitral valve repair 1980  . Sjogren's syndrome [facility])    Past Surgical History:  Procedure Laterality Date  . BREAST BIOPSY Right 2006   benign    . CHOLECYSTECTOMY  11/27/2011   Procedure: LAPAROSCOPIC CHOLECYSTECTOMY WITH INTRAOPERATIVE CHOLANGIOGRAM;  Surgeon: Adin Hector, MD;  Location: [facility];  Service: General;  Laterality: N/A;  laparoscopic cholecystectomy with choleangiogram umbilical hernia repair  . COLONOSCOPY  07/2014   WNL Amedeo Plenty)  . dexa  08/2012   normal per patient - no records available  . MITRAL VALVE REPAIR  1980   open heart  . ORIF ANKLE FRACTURE Left 02/26/2015   Procedure: OPEN REDUCTION INTERNAL FIXATION (ORIF) LEFT TRIMALLEOLAR ANKLE FRACTURE;  Surgeon: [name], MD;  Location: [facility];  Service: Orthopedics;  Laterality: Left;  . TUBAL LIGATION  1980  . UMBILICAL HERNIA REPAIR  11/27/2011   Procedure: HERNIA REPAIR UMBILICAL ADULT;  Surgeon: Adin Hector, MD;  Location: [facility];  Service: General;  Laterality: N/A;  . [place]   for fibroids -- partial, ovaries remain   Family History  Problem Relation Age of Onset  . Lupus Sister     and niece  . Cancer Mother     lung (nonsmoker)  . CAD Mother     MI in her 69s  . ALS Mother   . Kidney disease Father   . Alcohol abuse Father   .  Diabetes Father   . Cancer Brother     bone  . Diabetes Sister   . Stroke Sister   . Cancer Maternal Uncle     bone  . Depression Sister   . Kidney failure Other     on HD  . Diabetes Brother   . Heart attack Brother   . Stroke Maternal Grandmother    History  Sexual Activity  . Sexual activity: Not Currently  . Birth control/ protection: Surgical    Outpatient Encounter Prescriptions as of 11/30/2015  Medication Sig  . ALPRAZolam (XANAX) 1 MG tablet Take 1 tablet (1 mg total) by mouth 3 (three) times daily as needed for anxiety.  Marland Kitchen amLODipine (NORVASC) 10 MG tablet Take 1 tablet by mouth  daily  . antiseptic oral rinse (BIOTENE) LIQD 15 mLs by Mouth Rinse route at bedtime.   Marland Kitchen aspirin 325 MG EC tablet Take 325 mg by mouth daily.   . cholecalciferol (VITAMIN D) 1000 UNITS tablet  Take 1,000 Units by mouth daily.   . cycloSPORINE (RESTASIS) 0.05 % ophthalmic emulsion Place 1 drop into both eyes 2 (two) times daily.  . diclofenac sodium (VOLTAREN) 1 % GEL Apply 1 application topically 2 (two) times daily as needed (pain).  Marland Kitchen escitalopram (LEXAPRO) 20 MG tablet Take 1 tablet (20 mg total) by mouth 2 (two) times daily.  Marland Kitchen ezetimibe (ZETIA) 10 MG tablet Take 1 tablet (10 mg total) by mouth at bedtime.  . famotidine (PEPCID) 20 MG tablet Take 1 tablet by mouth two  times daily  . furosemide (LASIX) 20 MG tablet TAKE 1 TABLET BY MOUTH  DAILY AS NEEDED FOR EDEMA. (Patient taking differently: TAKE 1 TABLET BY MOUTH  DAILY AS NEEDED FOR WEIGHT GAIN OF 3 LBS IN 24 HOURS)  . gabapentin (NEURONTIN) 300 MG capsule Take 1 capsule by mouth 3  times a day  . hydrochlorothiazide (MICROZIDE) 12.5 MG capsule Take 1 capsule (12.5 mg total) by mouth daily.  . hydroxychloroquine (PLAQUENIL) 200 MG tablet Take 200 mg by mouth daily.   Marland Kitchen lovastatin (MEVACOR) 10 MG tablet Take 1 tablet (10 mg total) by mouth at bedtime.  . metoprolol succinate (TOPROL-XL) 50 MG 24 hr tablet Take 1 tablet (50 mg total) by mouth 2 (two) times daily. Take with or immediately following a meal.  . pilocarpine (SALAGEN) 5 MG tablet Take 2.5-5 mg by mouth 2 (two) times daily as needed. Take 1 tablet (5 mg) by mouth every morning and 1/2 tablet (2.5 mg) at 2-2:30pm if needed for Sjogren syndrome  . Polyethyl Glycol-Propyl Glycol (SYSTANE) 0.4-0.3 % GEL Place 1 drop into both eyes 2 (two) times daily.   . traMADol (ULTRAM) 50 MG tablet Take 1 tablet (50 mg total) by mouth every 12 (twelve) hours as needed for severe pain.  Marland Kitchen zolpidem (AMBIEN CR) 12.5 MG CR tablet Take 1 tablet (12.5 mg total) by mouth at bedtime as needed for sleep.   No facility-administered encounter medications on file as of 11/30/2015.     Activities of Daily Living In your present state of health, do you have any difficulty performing the following  activities: 11/30/2015 10/12/2015  Hearing? Y N  Vision? N N  Difficulty concentrating or making decisions? Y N  Walking or climbing stairs? Y N  Dressing or bathing? N N  Doing errands, shopping? N N  Preparing Food and eating ? N -  Using the Toilet? N -  In the past six months, have you accidently  leaked urine? N -  Do you have problems with loss of bowel control? N -  Managing your Medications? N -  Managing your Finances? N -  Housekeeping or managing your Housekeeping? N -  Some recent data might be hidden    Patient Care Team: Ria Bush, MD as PCP - General (Family Medicine) Lelon Perla, MD as Consulting Physician (Cardiology) Bo Merino, MD as Consulting Physician (Rheumatology) Cloria Spring, MD as Consulting Physician ([place]) Sharyne Peach, MD as Consulting Physician (Ophthalmology) Penni Bombard, MD as Consulting Physician (Neurology)    Assessment:     Hearing Screening   125Hz  250Hz  500Hz  1000Hz  2000Hz  3000Hz  4000Hz  6000Hz  8000Hz   Right ear:   0 0 40  40    Left ear:   0 0 40  0    Vision Screening Comments: Last vision exam in 10/2015 with Dr. Delman Cheadle   Exercise Activities and Dietary recommendations Current Exercise Habits: Home exercise routine, Type of exercise: walking, Time (Minutes): 45, Frequency (Times/Week): 3, Weekly Exercise (Minutes/Week): 135, Intensity: Mild, Exercise limited by: None identified  Goals    . Increase physical activity          Starting 11/30/2015, I will continue to walk at least 45 min 3 days per week.       Fall Risk Fall Risk  11/30/2015 11/30/2015 11/07/2015 02/22/2015 11/18/2014  Falls in the past year? - Yes Yes No Yes  Number falls in past yr: - 2 or more 2 or more - 2 or more  Injury with Fall? - Yes Yes - No  Risk Factor Category  (No Data) High Fall Risk - - -  Risk for fall due to : - - Impaired mobility;Impaired balance/gait - Impaired balance/gait  Follow up - Falls evaluation  completed;Education provided;Falls prevention discussed - - -   Depression Screen PHQ 2/9 Scores 11/30/2015 10/18/2014  PHQ - 2 Score 4 3  PHQ- 9 Score 13 9     Cognitive Testing MMSE - Mini Mental State Exam 11/30/2015  Orientation to time 5  Orientation to Place 5  Registration 3  Attention/ Calculation 0  Recall 1  Recall-comments pt was unable to recall 2 of 3 words  Language- name 2 objects 0  Language- repeat 1  Language- follow 3 step command 3  Language- read & follow direction 0  Write a sentence 0  Copy design 0  Total score 18   PLEASE NOTE: A Mini-Cog screen was completed. Maximum score is 20. A value of 0 denotes this part of [place] MMSE was not completed or the patient failed this part of the Mini-Cog screening.   Mini-Cog Screening Orientation to Time - Max 5 pts Orientation to Place - Max 5 pts Registration - Max 3 pts Recall - Max 3 pts Language Repeat - Max 1 pts Language Follow 3 Step Command - Max 3 pts  Immunization History  Administered Date(s) Administered  . Pneumococcal Polysaccharide-23 09/15/2012   Screening Tests Health Maintenance  Topic Date Due  . PAP SMEAR  12/08/2015 (Originally 07/24/1973)  . DTaP/Tdap/Td (1 - Tdap) 11/29/2025 (Originally 07/25/1971)  . INFLUENZA VACCINE  11/29/2025 (Originally 10/17/2015)  . ZOSTAVAX  11/29/2025 (Originally 07/24/2012)  . TETANUS/TDAP  11/29/2025 (Originally 07/25/1971)  . MAMMOGRAM  08/16/2017  . COLONOSCOPY  07/26/2024  . Hepatitis C Screening  Completed  . HIV Screening  Completed      Plan:     I have personally reviewed and addressed the Medicare  Annual Wellness questionnaire and have noted the following in the patient's chart:  A. Medical and social history B. Use of alcohol, tobacco or illicit drugs  C. Current medications and supplements D. Functional ability and status E.  Nutritional status F.  Physical activity G. Advance directives H. List of other physicians I.  Hospitalizations,  surgeries, and ER visits in previous 12 months J.  [place] to include hearing, vision, cognitive, depression L. Referrals and appointments - none  In addition, I have reviewed and discussed with patient certain preventive protocols, quality metrics, and best practice recommendations. A written personalized care plan for preventive services as well as general preventive health recommendations were provided to patient.  See attached scanned questionnaire for additional information.   Signed,   Lindell Noe, MHA, BS, LPN Health Advisor

## 2015-11-30 NOTE — Progress Notes (Signed)
Pre visit review using our clinic review tool, if applicable. No additional management support is needed unless otherwise documented below in the visit note. 

## 2015-11-30 NOTE — Progress Notes (Signed)
PCP notes:   Health maintenance:  Pap smear - pt will have completed at future date Tetanus - declined Shingles - declined Flu - declined HIV screening - completed  Abnormal screenings:   Hearing - failed Mini-Cog score: 18 Depression score: 13 Fall risk: hx of fall with injury  Patient concerns:   None  Nurse concerns:  Pt reports hx of 11 falls between 2016 and 2017.   Next PCP appt:   12/08/15 @ 0900

## 2015-12-01 LAB — HIV ANTIBODY (ROUTINE TESTING W REFLEX): HIV: NONREACTIVE

## 2015-12-02 NOTE — Progress Notes (Signed)
I reviewed health advisor's note, was available for consultation, and agree with documentation and plan.  

## 2015-12-08 ENCOUNTER — Encounter: Payer: Self-pay | Admitting: Family Medicine

## 2015-12-08 ENCOUNTER — Ambulatory Visit (INDEPENDENT_AMBULATORY_CARE_PROVIDER_SITE_OTHER): Payer: Medicare Other | Admitting: Family Medicine

## 2015-12-08 VITALS — BP 112/70 | HR 72 | Temp 98.1°F | Wt 155.5 lb

## 2015-12-08 DIAGNOSIS — I10 Essential (primary) hypertension: Secondary | ICD-10-CM

## 2015-12-08 DIAGNOSIS — N183 Chronic kidney disease, stage 3 unspecified: Secondary | ICD-10-CM

## 2015-12-08 DIAGNOSIS — E7800 Pure hypercholesterolemia, unspecified: Secondary | ICD-10-CM

## 2015-12-08 DIAGNOSIS — IMO0001 Reserved for inherently not codable concepts without codable children: Secondary | ICD-10-CM | POA: Insufficient documentation

## 2015-12-08 DIAGNOSIS — Z7189 Other specified counseling: Secondary | ICD-10-CM

## 2015-12-08 DIAGNOSIS — H9193 Unspecified hearing loss, bilateral: Secondary | ICD-10-CM

## 2015-12-08 DIAGNOSIS — H9041 Sensorineural hearing loss, unilateral, right ear, with unrestricted hearing on the contralateral side: Secondary | ICD-10-CM | POA: Insufficient documentation

## 2015-12-08 DIAGNOSIS — Z0000 Encounter for general adult medical examination without abnormal findings: Secondary | ICD-10-CM | POA: Diagnosis not present

## 2015-12-08 DIAGNOSIS — F339 Major depressive disorder, recurrent, unspecified: Secondary | ICD-10-CM

## 2015-12-08 DIAGNOSIS — W19XXXA Unspecified fall, initial encounter: Secondary | ICD-10-CM

## 2015-12-08 DIAGNOSIS — Z66 Do not resuscitate: Secondary | ICD-10-CM

## 2015-12-08 DIAGNOSIS — M3500 Sicca syndrome, unspecified: Secondary | ICD-10-CM

## 2015-12-08 NOTE — Assessment & Plan Note (Addendum)
Improved readings recently.

## 2015-12-08 NOTE — Assessment & Plan Note (Signed)
Chronic, stable. Continue current regimen. 

## 2015-12-08 NOTE — Assessment & Plan Note (Signed)
Discussed with patient. DNR form filled out today.

## 2015-12-08 NOTE — Assessment & Plan Note (Signed)
Refer to audiology per patient request.

## 2015-12-08 NOTE — Assessment & Plan Note (Signed)
Appreciate psych care.  ?

## 2015-12-08 NOTE — Patient Instructions (Addendum)
Call your insurance about the tetanus and shingles shots to see if they're is covered or how much it would cost and where is cheaper (here or pharmacy). If you want to receive here, call for nurse visit.  We will refer you to hearing doctors for further evaluation of hearing loss.  Use rolling walker regularly.  Decrease lovastatin to Monday/wednesday/friday. If not tolerated, decrease to once weekly. Update me with effect. Return as needed or in 6 months for follow up visit.  Health Maintenance, Female Adopting a healthy lifestyle and getting preventive care can go a long way to promote health and wellness. Talk with your health care provider about what schedule of regular examinations is right for you. This is a good chance for you to check in with your provider about disease prevention and staying healthy. In between checkups, there are plenty of things you can do on your own. Experts have done a lot of research about which lifestyle changes and preventive measures are most likely to keep you healthy. Ask your health care provider for more information. WEIGHT AND DIET  Eat a healthy diet  Be sure to include plenty of vegetables, fruits, low-fat dairy products, and lean protein.  Do not eat a lot of foods high in solid fats, added sugars, or salt.  Get regular exercise. This is one of the most important things you can do for your health.  Most adults should exercise for at least 150 minutes each week. The exercise should increase your heart rate and make you sweat (moderate-intensity exercise).  Most adults should also do strengthening exercises at least twice a week. This is in addition to the moderate-intensity exercise.  Maintain a healthy weight  Body mass index (BMI) is a measurement that can be used to identify possible weight problems. It estimates body fat based on height and weight. Your health care provider can help determine your BMI and help you achieve or maintain a healthy  weight.  For females 10 years of age and older:   A BMI below 18.5 is considered underweight.  A BMI of 18.5 to 24.9 is normal.  A BMI of 25 to 29.9 is considered overweight.  A BMI of 30 and above is considered obese.  Watch levels of cholesterol and blood lipids  You should start having your blood tested for lipids and cholesterol at 63 years of age, then have this test every 5 years.  You may need to have your cholesterol levels checked more often if:  Your lipid or cholesterol levels are high.  You are older than 63 years of age.  You are at high risk for heart disease.  CANCER SCREENING   Lung Cancer  Lung cancer screening is recommended for adults 43-19 years old who are at high risk for lung cancer because of a history of smoking.  A yearly low-dose CT scan of the lungs is recommended for people who:  Currently smoke.  Have quit within the past 15 years.  Have at least a 30-pack-year history of smoking. A pack year is smoking an average of one pack of cigarettes a day for 1 year.  Yearly screening should continue until it has been 15 years since you quit.  Yearly screening should stop if you develop a health problem that would prevent you from having lung cancer treatment.  Breast Cancer  Practice breast self-awareness. This means understanding how your breasts normally appear and feel.  It also means doing regular breast self-exams. Let your health  care provider know about any changes, no matter how small.  If you are in your 20s or 30s, you should have a clinical breast exam (CBE) by a health care provider every 1-3 years as part of a regular health exam.  If you are 43 or older, have a CBE every year. Also consider having a breast X-ray (mammogram) every year.  If you have a family history of breast cancer, talk to your health care provider about genetic screening.  If you are at high risk for breast cancer, talk to your health care provider about  having an MRI and a mammogram every year.  Breast cancer gene (BRCA) assessment is recommended for women who have family members with BRCA-related cancers. BRCA-related cancers include:  Breast.  Ovarian.  Tubal.  Peritoneal cancers.  Results of the assessment will determine the need for genetic counseling and BRCA1 and BRCA2 testing. Cervical Cancer Your health care provider may recommend that you be screened regularly for cancer of the pelvic organs (ovaries, uterus, and vagina). This screening involves a pelvic examination, including checking for microscopic changes to the surface of your cervix (Pap test). You may be encouraged to have this screening done every 3 years, beginning at age 77.  For women ages 70-65, health care providers may recommend pelvic exams and Pap testing every 3 years, or they may recommend the Pap and pelvic exam, combined with testing for human papilloma virus (HPV), every 5 years. Some types of HPV increase your risk of cervical cancer. Testing for HPV may also be done on women of any age with unclear Pap test results.  Other health care providers may not recommend any screening for nonpregnant women who are considered low risk for pelvic cancer and who do not have symptoms. Ask your health care provider if a screening pelvic exam is right for you.  If you have had past treatment for cervical cancer or a condition that could lead to cancer, you need Pap tests and screening for cancer for at least 20 years after your treatment. If Pap tests have been discontinued, your risk factors (such as having a new sexual partner) need to be reassessed to determine if screening should resume. Some women have medical problems that increase the chance of getting cervical cancer. In these cases, your health care provider may recommend more frequent screening and Pap tests. Colorectal Cancer  This type of cancer can be detected and often prevented.  Routine colorectal cancer  screening usually begins at 63 years of age and continues through 63 years of age.  Your health care provider may recommend screening at an earlier age if you have risk factors for colon cancer.  Your health care provider may also recommend using home test kits to check for hidden blood in the stool.  A small camera at the end of a tube can be used to examine your colon directly (sigmoidoscopy or colonoscopy). This is done to check for the earliest forms of colorectal cancer.  Routine screening usually begins at age 20.  Direct examination of the colon should be repeated every 5-10 years through 63 years of age. However, you may need to be screened more often if early forms of precancerous polyps or small growths are found. Skin Cancer  Check your skin from head to toe regularly.  Tell your health care provider about any new moles or changes in moles, especially if there is a change in a mole's shape or color.  Also tell your health care  provider if you have a mole that is larger than the size of a pencil eraser.  Always use sunscreen. Apply sunscreen liberally and repeatedly throughout the day.  Protect yourself by wearing long sleeves, pants, a wide-brimmed hat, and sunglasses whenever you are outside. HEART DISEASE, DIABETES, AND HIGH BLOOD PRESSURE   High blood pressure causes heart disease and increases the risk of stroke. High blood pressure is more likely to develop in:  People who have blood pressure in the high end of the normal range (130-139/85-89 mm Hg).  People who are overweight or obese.  People who are African American.  If you are 23-38 years of age, have your blood pressure checked every 3-5 years. If you are 77 years of age or older, have your blood pressure checked every year. You should have your blood pressure measured twice--once when you are at a hospital or clinic, and once when you are not at a hospital or clinic. Record the average of the two measurements.  To check your blood pressure when you are not at a hospital or clinic, you can use:  An automated blood pressure machine at a pharmacy.  A home blood pressure monitor.  If you are between 22 years and 53 years old, ask your health care provider if you should take aspirin to prevent strokes.  Have regular diabetes screenings. This involves taking a blood sample to check your fasting blood sugar level.  If you are at a normal weight and have a low risk for diabetes, have this test once every three years after 63 years of age.  If you are overweight and have a high risk for diabetes, consider being tested at a younger age or more often. PREVENTING INFECTION  Hepatitis B  If you have a higher risk for hepatitis B, you should be screened for this virus. You are considered at high risk for hepatitis B if:  You were born in a country where hepatitis B is common. Ask your health care provider which countries are considered high risk.  Your parents were born in a high-risk country, and you have not been immunized against hepatitis B (hepatitis B vaccine).  You have HIV or AIDS.  You use needles to inject street drugs.  You live with someone who has hepatitis B.  You have had sex with someone who has hepatitis B.  You get hemodialysis treatment.  You take certain medicines for conditions, including cancer, organ transplantation, and autoimmune conditions. Hepatitis C  Blood testing is recommended for:  Everyone born from 1 through 1963.  Anyone with known risk factors for hepatitis C. Sexually transmitted infections (STIs)  You should be screened for sexually transmitted infections (STIs) including gonorrhea and chlamydia if:  You are sexually active and are younger than 63 years of age.  You are older than 63 years of age and your health care provider tells you that you are at risk for this type of infection.  Your sexual activity has changed since you were last screened and  you are at an increased risk for chlamydia or gonorrhea. Ask your health care provider if you are at risk.  If you do not have HIV, but are at risk, it may be recommended that you take a prescription medicine daily to prevent HIV infection. This is called pre-exposure prophylaxis (PrEP). You are considered at risk if:  You are sexually active and do not regularly use condoms or know the HIV status of your partner(s).  You take drugs  by injection.  You are sexually active with a partner who has HIV. Talk with your health care provider about whether you are at high risk of being infected with HIV. If you choose to begin PrEP, you should first be tested for HIV. You should then be tested every 3 months for as long as you are taking PrEP.  PREGNANCY   If you are premenopausal and you may become pregnant, ask your health care provider about preconception counseling.  If you may become pregnant, take 400 to 800 micrograms (mcg) of folic acid every day.  If you want to prevent pregnancy, talk to your health care provider about birth control (contraception). OSTEOPOROSIS AND MENOPAUSE   Osteoporosis is a disease in which the bones lose minerals and strength with aging. This can result in serious bone fractures. Your risk for osteoporosis can be identified using a bone density scan.  If you are 50 years of age or older, or if you are at risk for osteoporosis and fractures, ask your health care provider if you should be screened.  Ask your health care provider whether you should take a calcium or vitamin D supplement to lower your risk for osteoporosis.  Menopause may have certain physical symptoms and risks.  Hormone replacement therapy may reduce some of these symptoms and risks. Talk to your health care provider about whether hormone replacement therapy is right for you.  HOME CARE INSTRUCTIONS   Schedule regular health, dental, and eye exams.  Stay current with your immunizations.   Do  not use any tobacco products including cigarettes, chewing tobacco, or electronic cigarettes.  If you are pregnant, do not drink alcohol.  If you are breastfeeding, limit how much and how often you drink alcohol.  Limit alcohol intake to no more than 1 drink per day for nonpregnant women. One drink equals 12 ounces of beer, 5 ounces of wine, or 1 ounces of hard liquor.  Do not use street drugs.  Do not share needles.  Ask your health care provider for help if you need support or information about quitting drugs.  Tell your health care provider if you often feel depressed.  Tell your health care provider if you have ever been abused or do not feel safe at home.   This information is not intended to replace advice given to you by your health care provider. Make sure you discuss any questions you have with your health care provider.   Document Released: 09/17/2010 Document Revised: 03/25/2014 Document Reviewed: 02/03/2013 Elsevier Interactive Patient Education [employer].

## 2015-12-08 NOTE — Assessment & Plan Note (Signed)
Recurrent falls - advised regular walker use.

## 2015-12-08 NOTE — Assessment & Plan Note (Addendum)
Advanced directive discussion - brought forms last week. Son Lakeyia Drouin is HCPOA. Clear she does not want CPR, no intubation. DNR form filled out today

## 2015-12-08 NOTE — Assessment & Plan Note (Addendum)
Chronic, LDL above goal, indication CKD. Continue zetia. Lovastatin causing myalgias - will decrease to MWF and if not tolerated then once weekly. Pt agrees. Will update with tolerance.  ASCVD 10 yr risk = 5%

## 2015-12-08 NOTE — Assessment & Plan Note (Signed)
Continue f/u with rheum

## 2015-12-08 NOTE — Progress Notes (Signed)
BP 112/70   Pulse 72   Temp 98.1 F (36.7 C) (Oral)   Wt 155 lb 8 oz (70.5 kg)   BMI 28.44 kg/m    CC: CPE Subjective:    Patient ID: Jack Quarto, female    DOB: [birth year], 65 y.o.   MRN: [id number]  HPI: [name] is a 65 y.o. female presenting on 12/08/2015 for Annual Exam   Saw Katha Cabal last week for medicare wellness visit, note reviewed. Failed hearing screen (attributed to plaquenil. Agrees to audiology evaluation. Ongoing high fall risk despite neuro eval, PT eval. Has canes and walkers at home but is not regularly using.  Preventative: COLONOSCOPY 07/2014; WNL Amedeo Plenty) Breast cancer screening - birads1 mammogram 08/2015 Well woman exam - s/p hysterectomy, ovaries remain. Latest pap WNL 2014.  Lung cancer screening - never smoker DEXA T -1.1 hip, -0.2 spine 08/2015 Flu shot - declines Tetanus shot - will check with insurance  Pneumovax 2014 Shingles shot - will check with insurance Advanced directive discussion - brought forms last week. Son Akasha Fels is HCPOA. Clear she does not want CPR, no intubation.  Seat belt use discussed Sunscreen use and skin screen discussed   Lives with son, 1 dog Occupation: unemployed, on disability for fibromyalgia since 2008. Edu: HS Religion: [employer] witness Activity: volunteers at senior center Diet: some water, fruits/vegetables daily No caffeine use  Relevant past medical, surgical, family and social history reviewed and updated as indicated. Interim medical history since our last visit reviewed. Allergies and medications reviewed and updated. Current Outpatient Prescriptions on File Prior to Visit  Medication Sig  . ALPRAZolam (XANAX) 1 MG tablet Take 1 tablet (1 mg total) by mouth 3 (three) times daily as needed for anxiety.  Marland Kitchen amLODipine (NORVASC) 10 MG tablet Take 1 tablet by mouth  daily  . antiseptic oral rinse (BIOTENE) LIQD 15 mLs by Mouth Rinse route at bedtime.   Marland Kitchen aspirin 325 MG EC tablet Take 325 mg by  mouth daily.   . cholecalciferol (VITAMIN D) 1000 UNITS tablet Take 1,000 Units by mouth daily.   . cycloSPORINE (RESTASIS) 0.05 % ophthalmic emulsion Place 1 drop into both eyes 2 (two) times daily.  . diclofenac sodium (VOLTAREN) 1 % GEL Apply 1 application topically 2 (two) times daily as needed (pain).  Marland Kitchen escitalopram (LEXAPRO) 20 MG tablet Take 1 tablet (20 mg total) by mouth 2 (two) times daily.  Marland Kitchen ezetimibe (ZETIA) 10 MG tablet Take 1 tablet (10 mg total) by mouth at bedtime.  . famotidine (PEPCID) 20 MG tablet Take 1 tablet by mouth two  times daily  . furosemide (LASIX) 20 MG tablet TAKE 1 TABLET BY MOUTH  DAILY AS NEEDED FOR EDEMA. (Patient taking differently: TAKE 1 TABLET BY MOUTH  DAILY AS NEEDED FOR WEIGHT GAIN OF 3 LBS IN 24 HOURS)  . gabapentin (NEURONTIN) 300 MG capsule Take 1 capsule by mouth 3  times a day  . hydrochlorothiazide (MICROZIDE) 12.5 MG capsule Take 1 capsule (12.5 mg total) by mouth daily.  . hydroxychloroquine (PLAQUENIL) 200 MG tablet Take 200 mg by mouth daily.   . metoprolol succinate (TOPROL-XL) 50 MG 24 hr tablet Take 1 tablet (50 mg total) by mouth 2 (two) times daily. Take with or immediately following a meal.  . pilocarpine (SALAGEN) 5 MG tablet Take 2.5-5 mg by mouth 2 (two) times daily as needed. Take 1 tablet (5 mg) by mouth every morning and 1/2 tablet (2.5 mg) at 2-2:30pm if needed  for Sjogren syndrome  . Polyethyl Glycol-Propyl Glycol (SYSTANE) 0.4-0.3 % GEL Place 1 drop into both eyes 2 (two) times daily.   . traMADol (ULTRAM) 50 MG tablet Take 1 tablet (50 mg total) by mouth every 12 (twelve) hours as needed for severe pain.  Marland Kitchen zolpidem (AMBIEN CR) 12.5 MG CR tablet Take 1 tablet (12.5 mg total) by mouth at bedtime as needed for sleep.   No current facility-administered medications on file prior to visit.     Review of Systems  Constitutional: Negative for activity change, appetite change, chills, fatigue, fever and unexpected weight change.    HENT: Negative for hearing loss.   Eyes: Negative for visual disturbance.  Respiratory: Negative for cough, chest tightness, shortness of breath and wheezing.   Cardiovascular: Negative for chest pain, palpitations and leg swelling.  Gastrointestinal: Negative for abdominal distention, abdominal pain, blood in stool, constipation, diarrhea, nausea and vomiting.  Genitourinary: Negative for difficulty urinating and hematuria.  Musculoskeletal: Negative for arthralgias, myalgias and neck pain.  Skin: Negative for rash.  Neurological: Negative for dizziness, seizures, syncope and headaches.  Hematological: Negative for adenopathy. Does not bruise/bleed easily.  Psychiatric/Behavioral: Negative for dysphoric mood. The patient is not nervous/anxious.        Endorses mood improving   Per HPI unless specifically indicated in ROS section     Objective:    BP 112/70   Pulse 72   Temp 98.1 F (36.7 C) (Oral)   Wt 155 lb 8 oz (70.5 kg)   BMI 28.44 kg/m   Wt Readings from Last 3 Encounters:  12/08/15 155 lb 8 oz (70.5 kg)  11/30/15 152 lb 8 oz (69.2 kg)  11/28/15 154 lb 8 oz (70.1 kg)    Physical Exam  Constitutional: She is oriented to person, place, and time. She appears well-developed and well-nourished. No distress.  HENT:  Head: Normocephalic and atraumatic.  Right Ear: Hearing, tympanic membrane, external ear and ear canal normal.  Left Ear: Hearing, tympanic membrane, external ear and ear canal normal.  Nose: Nose normal.  Mouth/Throat: Uvula is midline, oropharynx is clear and moist and mucous membranes are normal. No oropharyngeal exudate, posterior oropharyngeal edema or posterior oropharyngeal erythema.  Eyes: Conjunctivae and EOM are normal. Pupils are equal, round, and reactive to light. No scleral icterus.  Neck: Normal range of motion. Neck supple. Carotid bruit is not present. No thyromegaly present.  Cardiovascular: Normal rate, regular rhythm and intact distal pulses.    Murmur (2/6 SEM at RUSB) heard. Pulses:      Radial pulses are 2+ on the right side, and 2+ on the left side.  Pulmonary/Chest: Effort normal and breath sounds normal. No respiratory distress. She has no wheezes. She has no rales.  Abdominal: Soft. Bowel sounds are normal. She exhibits no distension and no mass. There is no tenderness. There is no rebound and no guarding.  Genitourinary: Vagina normal and uterus normal. Pelvic exam was performed with patient supine. There is no rash, tenderness, lesion or injury on the right labia. There is no rash, tenderness, lesion or injury on the left labia. Cervix exhibits no motion tenderness and no friability. Right adnexum displays no mass, no tenderness and no fullness. Left adnexum displays no mass, no tenderness and no fullness.  Musculoskeletal: Normal range of motion. She exhibits edema (tr).  Lymphadenopathy:       Head (right side): No submental, no submandibular, no tonsillar, no preauricular, no posterior auricular and no occipital adenopathy present.  Head (left side): No submental, no submandibular, no tonsillar, no preauricular, no posterior auricular and no occipital adenopathy present.    She has no cervical adenopathy.    She has no axillary adenopathy.       Right axillary: No lateral adenopathy present.       Left axillary: No lateral adenopathy present.      Right: No supraclavicular adenopathy present.       Left: No supraclavicular adenopathy present.  Neurological: She is alert and oriented to person, place, and time.  CN grossly intact, station and gait intact  Skin: Skin is warm and dry. No rash noted.  Psychiatric: She has a normal mood and affect. Her behavior is normal. Judgment and thought content normal.  Nursing note and vitals reviewed.  Results for orders placed or performed in visit on 11/30/15  HIV antibody (with reflex)  Result Value Ref Range   HIV 1&2 Ab, 4th Generation NONREACTIVE NONREACTIVE       Assessment & Plan:   Problem List Items Addressed This Visit    Advanced care planning/counseling discussion    Advanced directive discussion - brought forms last week. Son Darwin Caldron is HCPOA. Clear she does not want CPR, no intubation. DNR form filled out today      CKD (chronic kidney disease) stage 3, GFR 30-59 ml/min    Improved readings recently.      DNR no code (do not resuscitate)    Discussed with patient. DNR form filled out today.      Essential hypertension    Chronic, stable. Continue current regimen.       Relevant Medications   lovastatin (MEVACOR) 10 MG tablet   Fall    Recurrent falls - advised regular walker use.       Health maintenance examination - Primary    Preventative protocols reviewed and updated unless pt declined. Discussed healthy diet and lifestyle.       Hearing loss    Refer to audiology per patient request.       Relevant Orders   Ambulatory referral to Audiology   HYPERCHOLESTEROLEMIA    Chronic, LDL above goal, indication CKD. Continue zetia. Lovastatin causing myalgias - will decrease to MWF and if not tolerated then once weekly. Pt agrees. Will update with tolerance.  ASCVD 10 yr risk = 5%      Relevant Medications   lovastatin (MEVACOR) 10 MG tablet   MDD (major depressive disorder), recurrent episode ([place])    Appreciate psych care.       Sjogren's syndrome ([place])    Continue f/u with rheum       Other Visit Diagnoses   None.      Follow up plan: Return in about 6 months (around 06/06/2016) for follow up visit.  Ria Bush, MD

## 2015-12-08 NOTE — Progress Notes (Signed)
Pre visit review using our clinic review tool, if applicable. No additional management support is needed unless otherwise documented below in the visit note. 

## 2015-12-08 NOTE — Assessment & Plan Note (Signed)
Preventative protocols reviewed and updated unless pt declined. Discussed healthy diet and lifestyle.  

## 2015-12-13 ENCOUNTER — Encounter: Payer: Self-pay | Admitting: Physical Therapy

## 2015-12-13 ENCOUNTER — Ambulatory Visit: Payer: Medicare Other | Admitting: Physical Therapy

## 2015-12-13 ENCOUNTER — Ambulatory Visit: Payer: Medicare Other | Attending: Diagnostic Neuroimaging | Admitting: Occupational Therapy

## 2015-12-13 DIAGNOSIS — R208 Other disturbances of skin sensation: Secondary | ICD-10-CM | POA: Insufficient documentation

## 2015-12-13 DIAGNOSIS — M6281 Muscle weakness (generalized): Secondary | ICD-10-CM | POA: Diagnosis not present

## 2015-12-13 DIAGNOSIS — R2681 Unsteadiness on feet: Secondary | ICD-10-CM | POA: Insufficient documentation

## 2015-12-13 DIAGNOSIS — R2689 Other abnormalities of gait and mobility: Secondary | ICD-10-CM | POA: Diagnosis not present

## 2015-12-13 DIAGNOSIS — M25511 Pain in right shoulder: Secondary | ICD-10-CM | POA: Diagnosis not present

## 2015-12-13 DIAGNOSIS — R278 Other lack of coordination: Secondary | ICD-10-CM

## 2015-12-13 NOTE — Therapy (Signed)
[facility] [address] [place], [place], [address] Phone: [phone]   Fax:  [phone]  Occupational Therapy Evaluation  Patient Details  Name: Alexandria Leach MRN: [id number] Date of Birth: 1950/09/18 Referring Provider: Dr Leta Baptist  Encounter Date: 12/13/2015      OT End of Session - 12/13/15 1330    Visit Number 1   Number of Visits 17   Date for OT Re-Evaluation 02/10/16   Authorization Type [employer] Medicare   Authorization - Visit Number 1   Authorization - Number of Visits 10   OT Start Time 1105   OT Stop Time 1145   OT Time Calculation (min) 40 min   Behavior During Therapy [facility] for tasks assessed/performed      Past Medical History:  Diagnosis Date  . Ankle fracture, left 02/23/2015  . Ankle fracture, left 02/19/2015   from a fall  . Anxiety   . CHF (congestive heart failure) ([place])   . CKD (chronic kidney disease) stage 3, GFR 30-59 ml/min    saw nephrologist Dr Harden Mo  . DDD (degenerative disc disease), cervical   . Depression   . Fibromyalgia   . Glaucoma    s/p surgery, sees ophtho Q6 mo  . 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 History of pulmonary embolism 2001, 2006   completed coumadin courses  . History of rheumatic fever x3  . HLD (hyperlipidemia)   . HTN (hypertension)   . Insomnia   . Lung nodule 09/22/2012   RLL - 10mm, stable since 2014. Thought benign.   . Osteoarthritis    shoulders and knees, not RA per Dr Estanislado Pandy, positive ANA, positive Ro  . Osteopenia 09/18/2015   DEXA T -1.1 hip, -0.2 spine 08/2015   . Personal history of urinary calculi latest 2014  . Pneumonia 12/02/2011  . PONV (postoperative nausea and vomiting)   . Refusal of blood transfusions as patient is Jehovah's Witness   . Rheumatic heart disease 1980   s/p mitral valve repair 1980  . Sjogren's syndrome [facility])     Past Surgical History:   Procedure Laterality Date  . BREAST BIOPSY Right 2006   benign  . CHOLECYSTECTOMY  11/27/2011   Procedure: LAPAROSCOPIC CHOLECYSTECTOMY WITH INTRAOPERATIVE CHOLANGIOGRAM;  Surgeon: Adin Hector, MD;  Location: [facility];  Service: General;  Laterality: N/A;  laparoscopic cholecystectomy with choleangiogram umbilical hernia repair  . COLONOSCOPY  07/2014   WNL Amedeo Plenty)  . dexa  08/2012   normal per patient - no records available  . MITRAL VALVE REPAIR  1980   open heart  . ORIF ANKLE FRACTURE Left 02/26/2015   Procedure: OPEN REDUCTION INTERNAL FIXATION (ORIF) LEFT TRIMALLEOLAR ANKLE FRACTURE;  Surgeon: [name], MD;  Location: [place];  Service: Orthopedics;  Laterality: Left;  . TUBAL LIGATION  1980  . UMBILICAL HERNIA REPAIR  11/27/2011   Procedure: HERNIA REPAIR UMBILICAL ADULT;  Surgeon: Adin Hector, MD;  Location: [facility];  Service: General;  Laterality: N/A;  . [place]   for fibroids -- partial, ovaries remain    There were no vitals filed for this visit.      Subjective Assessment - 12/13/15 1109    Subjective  Pt reports she has had a decline in ADLS over the last [id number] months   Pertinent History see Epic   Patient Stated Goals to resume the activities she was performing  before(cooking, volunteer work)   Currently in Pain? Yes   Pain Score 10-Worst pain ever   Pain Location Shoulder   Pain Orientation Right   Pain Descriptors / Indicators Aching   Pain Type Chronic pain   Pain Onset More than a month ago   Pain Frequency Constant   Aggravating Factors  driving   Pain Relieving Factors meds,    Multiple Pain Sites No           [facility] OT Assessment - 12/13/15 1112      Assessment   Diagnosis cervical radiculopathy   Referring Provider Dr Leta Baptist   Onset Date --  approx 1 year ago   Prior Therapy PT     Precautions   Precautions Fall     Balance Screen   Has the patient fallen in the past 6 months Yes   How many times? 2   Has the  patient had a decrease in activity level because of a fear of falling?  Yes   Is the patient reluctant to leave their home because of a fear of falling?  No     Home  Environment   Family/patient expects to be discharged to: Private residence   Type of [place] One level   Alternate Level Stairs - Number of Steps 4   Bathroom Shower/Tub Tub/Shower unit  has shower seat and grab bar   Lives With Son     Prior Function   Level of Independence Independent   Vocation On disability;Volunteer work     ADL   Eating/Feeding Independent   Grooming Independent   Upper Body Bathing Modified independent   [place] independent   Upper Body Dressing Independent;Increased time   Lower Body Dressing Modified independent;Increased time   Toilet Tranfer Modified independent  3 in 1   Tub/Shower Transfer Modified independent   ADL comments needs increased time, pulling shirts overhead , and fastening bra is difficult     IADL   Shopping --  pt's son performs shopping   Light Housekeeping Performs light daily tasks such as dishwashing, bed making   Meal Prep --  needs assist with cooking including lifting/ moving pans   Financial Management Manages financial matters independently (budgets, writes checks, pays rent, bills goes to bank), collects and keeps track of income     Mobility   Mobility Status Independent;History of falls  with rollator     Written Expression   Dominant Hand Left     Cognition   Overall Cognitive Status Within Functional Limits for tasks assessed     Sensation   Light Touch Impaired by gross assessment  for RUE   Hot/Cold Impaired by gross assessment  for RUE     Coordination   Fine Motor Movements are Fluid and Coordinated No   9 Hole Peg Test Right;Left   Right 9 Hole Peg Test 46.38 secs   Left 9 Hole Peg Test 31.06 secs     ROM / Strength   AROM / PROM / Strength AROM     AROM   AROM  Assessment Site Shoulder   Right/Left Shoulder Right   Right Shoulder Flexion 100 Degrees  limited by pain   Right Shoulder ABduction 90 Degrees  limited by pain    Right Shoulder Internal Rotation --  grossly 50% limited by pain     Strength   Right Elbow Flexion 3/5   Right Elbow  Extension 3/5   Left Elbow Flexion 3+/5   Left Elbow Extension 3+/5     Hand Function   Right Hand Gross Grasp Impaired   Right Hand Grip (lbs) 20 lbs   Left Hand Gross Grasp Functional   Left Hand Grip (lbs) 50 lbs                           OT Short Term Goals - 12/13/15 1339      OT SHORT TERM GOAL #1   Title I with HEP. ck 01/11/16   Time 4   Status New     OT SHORT TERM GOAL #2   Title Pt will increase RUE grip strength to 25 lbs or greater for increased functional use.   Baseline RUE 20 lbs, LUE 50 lbs   Time 4   Period Weeks   Status New     OT SHORT TERM GOAL #3   Title Pt will demonstrate ability to retrieve a lightweight object a 110 shoulder flexion with pain no greater than 4 /10.   Time 4   Period Weeks   Status New     OT SHORT TERM GOAL #4   Title Pt will verbalize understanding of energy conservation/ activity modifications including AE/DME PRN to increase safety and indpendence with ADLS and minimize pain.   Time 4   Period Weeks   Status New           OT Long Term Goals - 12/13/15 1340      OT LONG TERM GOAL #1   Title Pt will demonstrate ability to retrieve a light weight object at 120 shoulder flexion with RUE pain no greater than 3/10.-ck 02/10/16   Time 8   Period Weeks   Status New     OT LONG TERM GOAL #2   Title Pt will demonstrate improved fine motor coordination as evidenced by decreasing RUE 9 hole peg test score by 4 secs.   Time 8   Period Weeks   Status New     OT LONG TERM GOAL #3   Title Pt will report that she has resumed basic cooking tasks modified independently including managing pots/ pans   Time 8   Period Weeks    Status New     OT LONG TERM GOAL #4   Title Pt will report increased ease with donning pullover shirts and fastening her bra.   Time 8   Period Weeks   Status New               Plan - 12/13/15 1333    Clinical Impression Statement Pt is a 65 yo female  with cervical radiculopathy, neuropathy, right shoulder arthritis, lumbar radiculopathy and chronic pain syndrome presents to occupational therapy with a decline in function for ADLS and mobility. Pt can benefit from skilled occupational therapy to address right shoulder pain, weakness, sensory impairment, decreased coordination and decreased balance which impedes performance of ADLS/ IADLs.   Rehab Potential Good   OT Frequency 2x / week  plus eval   OT Duration 8 weeks   OT Treatment/Interventions Self-care/ADL training;Moist Heat;Fluidtherapy;DME and/or AE instruction;Splinting;Patient/family education;Balance training;Therapeutic exercises;Ultrasound;Therapeutic activities;Neuromuscular education;Manual Therapy;Parrafin;Electrical Stimulation;Energy conservation   Plan inital HEP for RUE   Consulted and Agree with Plan of Care Patient      Patient will benefit from skilled therapeutic intervention in order to improve the following deficits and impairments:  Abnormal gait, Decreased cognition, Decreased knowledge  of use of DME, Pain, Decreased coordination, Decreased mobility, Impaired sensation, Decreased strength, Decreased range of motion, Decreased endurance, Decreased activity tolerance, Decreased balance, Decreased knowledge of precautions, Difficulty walking, Impaired UE functional use  Visit Diagnosis: Muscle weakness (generalized) - Plan: Ot plan of care cert/re-cert  Pain in right shoulder - Plan: Ot plan of care cert/re-cert  Other lack of coordination - Plan: Ot plan of care cert/re-cert  Unsteadiness on feet - Plan: Ot plan of care cert/re-cert  Other disturbances of skin sensation - Plan: Ot plan of care  cert/re-cert      G-Codes - [id number]    Functional Assessment Tool Used grip strength RUE 20 lbs, LUE 50 lbs, 9 hole peg test RUE 46.38 secs, LUE 31.06   Functional Limitation Carrying, moving and handling objects   Carrying, Moving and Handling Objects Current Status [id number]) At least 20 percent but less than 40 percent impaired, limited or restricted   Carrying, Moving and Handling Objects Goal Status [id number]) At least 1 percent but less than 20 percent impaired, limited or restricted      Problem List Patient Active Problem List   Diagnosis Date Noted  . DNR no code (do not resuscitate) 12/08/2015  . Hearing loss 12/08/2015  . TIA (transient ischemic attack) 10/12/2015  . Mild mitral regurgitation 10/12/2015  . Osteopenia 09/18/2015  . Right arm pain 08/29/2015  . Fall   . Trimalleolar fracture of left ankle 02/23/2015  . Right sided sciatica 02/21/2015  . Imbalance 01/12/2015  . Transaminitis 12/24/2014  . DDD (degenerative disc disease), cervical   . Health maintenance examination 10/18/2014  . Advanced care planning/counseling discussion 10/18/2014  . Medicare annual wellness visit, initial 10/18/2014  . Refusal of blood transfusions as patient is Jehovah's Witness   . Sjogren's syndrome ([place])   . CKD (chronic kidney disease) stage 3, GFR 30-59 ml/min   . Glaucoma   . Fibromyalgia   . Osteoarthritis   . History of pulmonary embolism   . Lung nodule 09/22/2012  . Diastolic CHF ([place]) [id number]  . Aortic stenosis 04/22/2011  . Palpitations 08/10/2010  . Dyspnea 08/10/2010  . HOT FLASHES 06/28/2008  . Trochanteric bursitis of right hip 06/28/2008  . ARTHRALGIA 07/01/2007  . BACK PAIN, LEFT 08/25/2006  . GAD (generalized anxiety disorder) 03/28/2006  . MITRAL VALVE REPLACEMENT, HX OF 03/28/2006  . TAH/BSO, HX OF 03/28/2006  . HYPERCHOLESTEROLEMIA 12/31/2005  . MDD (major depressive disorder), recurrent episode ([place]) 12/31/2005  . RHEUMATIC HEART DISEASE  12/31/2005  . Essential hypertension 12/31/2005  . Chronic insomnia 12/31/2005    Tyqwan Pink 12/13/2015, 1:56 PM Theone Murdoch, OTR/L [phone] Phone: [phone] 1:56 PM 12/13/15 [facility] [address] [place], [place], [address] Phone: [phone]   Fax:  [phone]  Name: Alexandria Leach MRN: [id number] Date of Birth: Mar 31, 1950

## 2015-12-13 NOTE — Therapy (Signed)
[facility] [address] [place], [place], [address] Phone: [phone]   Fax:  [phone]  Physical Therapy Evaluation  Patient Details  Name: Alexandria Leach MRN: [id number] Date of Birth: [birth year] Referring Provider: Andrey Spearman, MD  Encounter Date: 12/13/2015      PT End of Session - 12/13/15 1737    Visit Number 1   Number of Visits 17   Date for PT Re-Evaluation 02/09/16   Authorization Type [employer] Medicare   Authorization Time Period G-Code every 10th visit   PT Start Time 1015   PT Stop Time 1100   PT Time Calculation (min) 45 min   Equipment Utilized During Treatment Gait belt   Activity Tolerance Patient tolerated treatment well   Behavior During Therapy WFL for tasks assessed/performed      Past Medical History:  Diagnosis Date  . Ankle fracture, left 02/23/2015  . Ankle fracture, left 02/19/2015   from a fall  . Anxiety   . CHF (congestive heart failure) ([facility])   . CKD (chronic kidney disease) stage 3, GFR 30-59 ml/min    saw nephrologist Dr Harden Mo  . DDD (degenerative disc disease), cervical   . Depression   . Fibromyalgia   . Glaucoma    s/p surgery, sees ophtho Q6 mo  . 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 History of pulmonary embolism 2001, 2006   completed coumadin courses  . History of rheumatic fever x3  . HLD (hyperlipidemia)   . HTN (hypertension)   . Insomnia   . Lung nodule 09/22/2012   RLL - 29mm, stable since 2014. Thought benign.   . Osteoarthritis    shoulders and knees, not RA per Dr Estanislado Pandy, positive ANA, positive Ro  . Osteopenia 09/18/2015   DEXA T -1.1 hip, -0.2 spine 08/2015   . Personal history of urinary calculi latest 2014  . Pneumonia 12/02/2011  . PONV (postoperative nausea and vomiting)   . Refusal of blood transfusions as patient is Jehovah's Witness   . Rheumatic heart disease 1980   s/p  mitral valve repair 1980  . Sjogren's syndrome [facility])     Past Surgical History:  Procedure Laterality Date  . BREAST BIOPSY Right 2006   benign  . CHOLECYSTECTOMY  11/27/2011   Procedure: LAPAROSCOPIC CHOLECYSTECTOMY WITH INTRAOPERATIVE CHOLANGIOGRAM;  Surgeon: Adin Hector, MD;  Location: [facility];  Service: General;  Laterality: N/A;  laparoscopic cholecystectomy with choleangiogram umbilical hernia repair  . COLONOSCOPY  07/2014   WNL Amedeo Plenty)  . dexa  08/2012   normal per patient - no records available  . MITRAL VALVE REPAIR  1980   open heart  . ORIF ANKLE FRACTURE Left 02/26/2015   Procedure: OPEN REDUCTION INTERNAL FIXATION (ORIF) LEFT TRIMALLEOLAR ANKLE FRACTURE;  Surgeon: [name], MD;  Location: [facility];  Service: Orthopedics;  Laterality: Left;  . TUBAL LIGATION  1980  . UMBILICAL HERNIA REPAIR  11/27/2011   Procedure: HERNIA REPAIR UMBILICAL ADULT;  Surgeon: Adin Hector, MD;  Location: [facility];  Service: General;  Laterality: N/A;  . [place]   for fibroids -- partial, ovaries remain    There were no vitals filed for this visit.       Subjective Assessment - 12/13/15 1025    Subjective Pt reports feeling like her knee will "give out" when walking long distances or going up and down stairs.  She  also states the pain in her R hip and neck limit her in being able to do the things she wants to do.   Limitations Walking;House hold activities;Standing   How long can you stand comfortably? <30 min   How long can you walk comfortably? 1/2 mile - 1 mile   Patient Stated Goals "Be able to go places and travel and helping out at the senior center."   Currently in Pain? Yes   Pain Score 10-Worst pain ever  Worst: 10/10; Best:  3/10   Pain Location Neck   Pain Orientation Right;Posterior   Pain Descriptors / Indicators Aching;Sharp   Pain Type Chronic pain   Pain Onset More than a month ago   Pain Frequency Constant   Aggravating Factors  driving   Pain  Relieving Factors medication, relaxing in the right chair, laying down with the right pillow   Multiple Pain Sites Yes   Pain Score 8  Worst: 10/10; Best: 0/10   Pain Location Hip   Pain Orientation Right   Pain Descriptors / Indicators Aching   Pain Type Chronic pain   Pain Radiating Towards back   Pain Onset More than a month ago   Pain Frequency Constant   Aggravating Factors  Standing too long, grocery shopping   Pain Relieving Factors sitting down, medication            [facility] PT Assessment - 12/13/15 0930      Assessment   Medical Diagnosis Gait difficulty   Referring Provider Andrey Spearman, MD   Onset Date/Surgical Date 11/07/15   Hand Dominance Left     Precautions   Precautions Other (comment)  Rollator at all times     Balance Screen   Has the patient fallen in the past 6 months Yes   How many times? 2   Has the patient had a decrease in activity level because of a fear of falling?  Yes   Is the patient reluctant to leave their home because of a fear of falling?  No     Home Environment   Living Environment Private residence   Living Arrangements Children  son   Available Help at Discharge Family   Type of [place] to enter   Entrance Stairs-Number of Steps 4 in the front; 3 in the back   [place] One level   Onward - quad;Cane - single point;Wheelchair - [employer] - 2 wheels;Shower seat;Toilet riser  Rollator     Prior Function   Level of Independence Independent   Vocation On disability;Volunteer work     ROM / Strength   AROM / PROM / Strength AROM;Strength     AROM   Overall AROM  Within functional limits for tasks performed   AROM Assessment Site Shoulder;Hip;Knee;Ankle     Strength   Overall Strength Deficits   Strength Assessment Site Shoulder;Hip;Knee;Ankle   Right/Left Shoulder Right;Left   Right Shoulder Flexion 3/5   Left Shoulder Flexion 3/5   Right/Left  Hip Right;Left   Right Hip Flexion 3-/5   Right Hip ABduction 3/5   Left Hip Flexion 3-/5   Left Hip ABduction 3/5   Right/Left Knee Right;Left   Right Knee Flexion 3/5   Right Knee Extension 3-/5   Left Knee Flexion 3/5   Left Knee Extension 3+/5   Right/Left Ankle Right;Left   Right Ankle Dorsiflexion 3/5   Left Ankle Dorsiflexion 3/5  painful  due to h/o L ankle fx, per pt     Transfers   Transfers Sit to Stand;Stand to [employer] Transfers   Sit to Stand 5: Supervision;With upper extremity assist;With armrests;From chair/3-in-1   Stand to Sit 5: Supervision;With upper extremity assist;With armrests;To chair/3-in-1   Stand Pivot Transfers 5: Supervision;With armrests     Ambulation/Gait   Ambulation/Gait Yes   Ambulation/Gait Assistance 5: Supervision   Ambulation Distance (Feet) 40 Feet  2x37ft, 2x54ft   Assistive device Rollator   Gait Pattern Step-through pattern;Decreased stride length;Decreased hip/knee flexion - right;Decreased hip/knee flexion - left;Left circumduction;Right circumduction;Shuffle;Trunk flexed   Ambulation Surface Level;Indoor   Gait velocity 2.62ft/sec  Indicates limited community ambulator     Standardized Balance Assessment   Standardized Balance Assessment Timed Up and Go Test     Timed Up and Go Test   TUG Normal TUG   Normal TUG (seconds) 21.91  with rollator; 14.25 without rollator                           PT Education - 12/13/15 1736    Education provided Yes   Education Details Pt educated on POC, diagnosis, and the impairments that will be addressed in therapy.   Person(s) Educated Patient   Methods Explanation   Comprehension Verbalized understanding          PT Short Term Goals - 12/13/15 1758      PT SHORT TERM GOAL #1   Title Pt will be independent and verbalize understanding of her initial HEP program to further her progress. (Target Date for all STGs: 01/12/16)   Time 4   Period Weeks   Status  New     PT SHORT TERM GOAL #2   Title Pt will improve TUG with rollator to <18 sec to indicate decr risk for falls.   Time 4   Period Weeks   Status New     PT SHORT TERM GOAL #3   Title Pt will improve Berg score by 3 points from baseline to indicate a decr risk for falls.   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   Time 4   Period Weeks   Status New     PT SHORT TERM GOAL #5   Title Pt will report an incr of no more than 2 increments of the NPRS pain scale when performing HEP.   Time 4   Period Weeks   Status New           PT Long Term Goals - 12/13/15 1814      PT LONG TERM GOAL #1   Title Pt will be independent and verbalize understanding of HEP and on-going fitness plan to maintain progress made in therapy and improve overall health. (Target Date for all LTGs: 02/08/17)   Time 8   Period Weeks   Status New     PT LONG TERM GOAL #2   Title Pt will improve TUG score with LRAD to <13.5 sec to indicate low fall risk.   Time 8   Period Weeks   Status New     PT LONG TERM GOAL #3   Title Pt will improve Berg Balance score to >45/56 to indicate decr fall risk.   Time 8   Period Weeks  Status New     PT LONG TERM GOAL #4   Title Pt will negotiate 4 stairs with mod I and LRAD with unilat UE support to incr safety entering/exiting her home.   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   Time 8   Period Weeks   Status New               Plan - 12/13/15 1739    Clinical Impression Statement Pt is a 65 year old female who presents to outpatient PT with a diagnosis of gait difficulty (11/07/15) and a history of falls in the last 6 months.  Pt's PMH is significant for CKD stage 3, HTN, recurrent  falls, B hearing loss, L ankle fx, CHF, DDD, fibromyalgia, depression, OA, and Sjogren's Syndrome.  She demonstrates significant weakness in B LE and a gait speed of 2.61ft/sec indicating she is a limited community ambulator.  Pt also completed the TUG in 21.91 sec with the rollator and 14.25 sec without the rollator both indicating she is at a high risk for falling.  Due to time restraints, a Berg balance Test was not performed, however, it will be addressed in the next session.  Pt reports pain in the neck and R shoulder as well as the R hip that limit her from participating in her normal activities.  While this will not be directly addressed during therapy, it will be montiored.  Pt's condition appears to be evolving and of moderate complexity.  She would benefit from skilled PT to address her deficits.   Rehab Potential Good   Clinical Impairments Affecting Rehab Potential OA, CHF, DDD, fibromyalgia, B hearing loss, Sjogren's Syndrome, HTN   PT Frequency 2x / week   PT Duration 8 weeks   PT Treatment/Interventions ADLs/Self Care Home Management;Functional mobility training;Stair training;Gait training;DME Instruction;Therapeutic activities;Therapeutic exercise;Balance training;Neuromuscular re-education;Patient/family education;Manual techniques;Energy conservation   PT Next Visit Plan Complete Berg, initiate HEP, LE strengthening, mobes to R hip?   Consulted and Agree with Plan of Care Patient      Patient will benefit from skilled therapeutic intervention in order to improve the following deficits and impairments:  Abnormal gait, Cardiopulmonary status limiting activity, Decreased activity tolerance, Decreased balance, Decreased mobility, Decreased endurance, Decreased strength, Impaired perceived functional ability, Improper body mechanics, Pain  Visit Diagnosis: Other abnormalities of gait and mobility  Unsteadiness on feet  Muscle weakness (generalized)     Problem List Patient  Active Problem List   Diagnosis Date Noted  . DNR no code (do not resuscitate) 12/08/2015  . Hearing loss 12/08/2015  . TIA (transient ischemic attack) 10/12/2015  . Mild mitral regurgitation 10/12/2015  . Osteopenia 09/18/2015  . Right arm pain 08/29/2015  . Fall   . Trimalleolar fracture of left ankle 02/23/2015  . Right sided sciatica 02/21/2015  . Imbalance 01/12/2015  . Transaminitis 12/24/2014  . DDD (degenerative disc disease), cervical   . Health maintenance examination 10/18/2014  . Advanced care planning/counseling discussion 10/18/2014  . Medicare annual wellness visit, initial 10/18/2014  . Refusal of blood transfusions as patient is Jehovah's Witness   . Sjogren's syndrome ([place])   . CKD (chronic kidney disease) stage 3, GFR 30-59 ml/min   . Glaucoma   . Fibromyalgia   . Osteoarthritis   . History of  pulmonary embolism   . Lung nodule 09/22/2012  . Diastolic CHF ([place]) [id number]  . Aortic stenosis 04/22/2011  . Palpitations 08/10/2010  . Dyspnea 08/10/2010  . HOT FLASHES 06/28/2008  . Trochanteric bursitis of right hip 06/28/2008  . ARTHRALGIA 07/01/2007  . BACK PAIN, LEFT 08/25/2006  . GAD (generalized anxiety disorder) 03/28/2006  . MITRAL VALVE REPLACEMENT, HX OF 03/28/2006  . TAH/BSO, HX OF 03/28/2006  . HYPERCHOLESTEROLEMIA 12/31/2005  . MDD (major depressive disorder), recurrent episode ([place]) 12/31/2005  . RHEUMATIC HEART DISEASE 12/31/2005  . Essential hypertension 12/31/2005  . Chronic insomnia 12/31/2005    Katherine Mantle, SPT 12/13/2015, 6:25 PM  [facility] [address] [place], [place], [address] Phone: [phone]   Fax:  [phone]  Name: LARYAH ZUCHOWSKI MRN: [id number] Date of Birth: 03-24-1950

## 2015-12-14 ENCOUNTER — Encounter: Payer: Self-pay | Admitting: Occupational Therapy

## 2015-12-14 ENCOUNTER — Ambulatory Visit: Payer: Self-pay | Admitting: Physical Therapy

## 2015-12-15 DIAGNOSIS — S82853A Displaced trimalleolar fracture of unspecified lower leg, initial encounter for closed fracture: Secondary | ICD-10-CM | POA: Diagnosis not present

## 2015-12-18 ENCOUNTER — Ambulatory Visit: Payer: Medicare Other | Admitting: Occupational Therapy

## 2015-12-18 ENCOUNTER — Encounter: Payer: Self-pay | Admitting: Occupational Therapy

## 2015-12-18 DIAGNOSIS — M25511 Pain in right shoulder: Secondary | ICD-10-CM

## 2015-12-18 DIAGNOSIS — M6281 Muscle weakness (generalized): Secondary | ICD-10-CM

## 2015-12-18 DIAGNOSIS — R2681 Unsteadiness on feet: Secondary | ICD-10-CM

## 2015-12-18 DIAGNOSIS — R278 Other lack of coordination: Secondary | ICD-10-CM

## 2015-12-18 NOTE — Therapy (Signed)
[facility] [address] [place], [place], [address] Phone: [phone]   Fax:  [phone]  Patient Details  Name: Alexandria Leach MRN: [id number] Date of Birth: 03-Aug-1950 Referring Provider:  No ref. provider found  Encounter Date: 12/18/2015     OT Short Term Goals - 12/13/15 1339      OT SHORT TERM GOAL #1   Title I with HEP. ck 01/11/16   Time 4   Status Not met     OT SHORT TERM GOAL #2   Title Pt will increase RUE grip strength to 25 lbs or greater for increased functional use.   Baseline RUE 20 lbs, LUE 50 lbs   Time 4   Period Weeks   Status Not met     OT SHORT TERM GOAL #3   Title Pt will demonstrate ability to retrieve a lightweight object a 110 shoulder flexion with pain no greater than 4 /10.   Time 4   Period Weeks   Status Not met     OT SHORT TERM GOAL #4   Title Pt will verbalize understanding of energy conservation/ activity modifications including AE/DME PRN to increase safety and indpendence with ADLS and minimize pain.   Time 4   Period Weeks   Status Not met          OT Long Term Goals - 12/13/15 1340      OT LONG TERM GOAL #1   Title Pt will demonstrate ability to retrieve a light weight object at 120 shoulder flexion with RUE pain no greater than 3/10.-ck 02/10/16   Time 8   Period Weeks   Status Not met     OT LONG TERM GOAL #2   Title Pt will demonstrate improved fine motor coordination as evidenced by decreasing RUE 9 hole peg test score by 4 secs.   Time 8   Period Weeks   Status Not met     OT LONG TERM GOAL #3   Title Pt will report that she has resumed basic cooking tasks modified independently including managing pots/ pans   Time 8   Period Weeks   Status Not met     OT LONG TERM GOAL #4   Title Pt will report increased ease with donning pullover shirts and fastening her bra.   Time 8   Period Weeks   Status Not met     OCCUPATIONAL THERAPY DISCHARGE  SUMMARY  Visits from Start of Care: 1  Current functional level related to goals / functional outcomes: See initial eval  Remaining deficits: See eval   Education / Equipment: Pt education was not completed as pt was only seen for evaluation.Pt no showed for 2 appointments. Pt requests d/c from therapy stating she does not plan to return due to pain. Plan: Patient agrees to discharge.  Patient goals were not met. Patient is being discharged due to the patient's request.  ?????         Axel Frisk 12/18/2015, 10:00 AM Theone Murdoch, OTR/L [phone] Phone: [phone] 10:02 AM 12/18/15 [facility] [address] [place], [place], [address] Phone: [phone]   Fax:  [phone]

## 2015-12-19 ENCOUNTER — Encounter: Payer: Self-pay | Admitting: Occupational Therapy

## 2015-12-21 ENCOUNTER — Encounter: Payer: Self-pay | Admitting: Occupational Therapy

## 2015-12-25 ENCOUNTER — Ambulatory Visit: Payer: Self-pay | Admitting: Physical Therapy

## 2015-12-25 ENCOUNTER — Encounter: Payer: Self-pay | Admitting: Occupational Therapy

## 2015-12-27 ENCOUNTER — Ambulatory Visit: Payer: Self-pay | Admitting: Physical Therapy

## 2015-12-27 ENCOUNTER — Other Ambulatory Visit: Payer: Self-pay | Admitting: *Deleted

## 2015-12-27 ENCOUNTER — Encounter: Payer: Self-pay | Admitting: Occupational Therapy

## 2015-12-27 MED ORDER — AMLODIPINE BESYLATE 10 MG PO TABS
10.0000 mg | ORAL_TABLET | Freq: Every day | ORAL | 3 refills | Status: DC
Start: 2015-12-27 — End: 2017-01-18

## 2016-01-02 ENCOUNTER — Encounter: Payer: Self-pay | Admitting: Occupational Therapy

## 2016-01-02 ENCOUNTER — Ambulatory Visit: Payer: Self-pay | Admitting: Physical Therapy

## 2016-01-04 ENCOUNTER — Encounter: Payer: Self-pay | Admitting: Occupational Therapy

## 2016-01-04 ENCOUNTER — Ambulatory Visit: Payer: Self-pay | Admitting: Physical Therapy

## 2016-01-09 ENCOUNTER — Ambulatory Visit: Payer: Self-pay | Admitting: Physical Therapy

## 2016-01-09 ENCOUNTER — Encounter: Payer: Self-pay | Admitting: Occupational Therapy

## 2016-01-11 ENCOUNTER — Encounter: Payer: Self-pay | Admitting: Occupational Therapy

## 2016-01-11 ENCOUNTER — Ambulatory Visit: Payer: Self-pay | Admitting: Physical Therapy

## 2016-01-16 ENCOUNTER — Encounter: Payer: Self-pay | Admitting: Occupational Therapy

## 2016-01-16 ENCOUNTER — Ambulatory Visit: Payer: Self-pay | Admitting: Physical Therapy

## 2016-01-18 ENCOUNTER — Ambulatory Visit: Payer: Self-pay | Admitting: Physical Therapy

## 2016-01-18 ENCOUNTER — Encounter: Payer: Self-pay | Admitting: Occupational Therapy

## 2016-01-24 ENCOUNTER — Encounter: Payer: Self-pay | Admitting: Occupational Therapy

## 2016-01-24 ENCOUNTER — Ambulatory Visit: Payer: Self-pay | Admitting: Physical Therapy

## 2016-01-26 ENCOUNTER — Encounter: Payer: Self-pay | Admitting: *Deleted

## 2016-01-26 ENCOUNTER — Other Ambulatory Visit: Payer: Self-pay | Admitting: *Deleted

## 2016-01-26 ENCOUNTER — Encounter: Payer: Self-pay | Admitting: Occupational Therapy

## 2016-01-26 ENCOUNTER — Ambulatory Visit: Payer: Self-pay | Admitting: Physical Therapy

## 2016-01-26 DIAGNOSIS — R300 Dysuria: Secondary | ICD-10-CM

## 2016-01-26 DIAGNOSIS — Z79899 Other long term (current) drug therapy: Secondary | ICD-10-CM | POA: Diagnosis not present

## 2016-01-26 LAB — COMPLETE METABOLIC PANEL WITH GFR
ALT: 14 U/L (ref 6–29)
AST: 26 U/L (ref 10–35)
Albumin: 4.1 g/dL (ref 3.6–5.1)
Alkaline Phosphatase: 50 U/L (ref 33–130)
BUN: 17 mg/dL (ref 7–25)
CALCIUM: 9.4 mg/dL (ref 8.6–10.4)
CHLORIDE: 105 mmol/L (ref 98–110)
CO2: 22 mmol/L (ref 20–31)
Creat: 1.43 mg/dL — ABNORMAL HIGH (ref 0.50–0.99)
GFR, Est African American: 45 mL/min — ABNORMAL LOW (ref >=60)
GFR, Est Non African American: 39 mL/min — ABNORMAL LOW (ref >=60)
GLUCOSE: 139 mg/dL — AB (ref 65–99)
POTASSIUM: 4.1 mmol/L (ref 3.5–5.3)
SODIUM: 140 mmol/L (ref 135–146)
Total Bilirubin: 0.5 mg/dL (ref 0.2–1.2)
Total Protein: 7 g/dL (ref 6.1–8.1)

## 2016-01-26 LAB — CBC WITH DIFFERENTIAL/PLATELET
Basophils Absolute: 0 cells/uL (ref 0–200)
Basophils Relative: 0 %
EOS PCT: 1 %
Eosinophils Absolute: 45 cells/uL (ref 15–500)
HCT: 38.1 % (ref 35.0–45.0)
Hemoglobin: 12.4 g/dL (ref 11.7–15.5)
LYMPHS PCT: 45 %
Lymphs Abs: 2025 cells/uL (ref 850–3900)
MCH: 27.1 pg (ref 27.0–33.0)
MCHC: 32.5 g/dL (ref 32.0–36.0)
MCV: 83.4 fL (ref 80.0–100.0)
MONOS PCT: 8 %
MPV: 11.3 fL (ref 7.5–12.5)
Monocytes Absolute: 360 cells/uL (ref 200–950)
NEUTROS PCT: 46 %
Neutro Abs: 2070 cells/uL (ref 1500–7800)
Platelets: 209 10*3/uL (ref 140–400)
RBC: 4.57 MIL/uL (ref 3.80–5.10)
RDW: 15 % (ref 11.0–15.0)
WBC: 4.5 10*3/uL (ref 3.8–10.8)

## 2016-01-27 LAB — URINALYSIS, MICROSCOPIC ONLY
Casts: NONE SEEN [LPF]
Yeast: NONE SEEN [HPF]

## 2016-01-27 LAB — URINALYSIS, ROUTINE W REFLEX MICROSCOPIC
BILIRUBIN URINE: NEGATIVE
Glucose, UA: NEGATIVE
Hgb urine dipstick: NEGATIVE
Nitrite: NEGATIVE
SPECIFIC GRAVITY, URINE: 1.027 (ref 1.001–1.035)
pH: 5.5 (ref 5.0–8.0)

## 2016-01-29 ENCOUNTER — Telehealth: Payer: Self-pay | Admitting: Radiology

## 2016-01-29 NOTE — Telephone Encounter (Signed)
Call pt about UA results

## 2016-01-29 NOTE — Progress Notes (Signed)
Should see PCP abn UA

## 2016-01-29 NOTE — Telephone Encounter (Signed)
I have called patient/ and sent my chart message left message for patient, to ask her to call me back if she does not get the my chart message.

## 2016-01-29 NOTE — Telephone Encounter (Signed)
-----   Message from Bo Merino, MD sent at 01/29/2016 12:32 PM EST ----- Should see PCP abn UA

## 2016-01-30 ENCOUNTER — Encounter: Payer: Self-pay | Admitting: Occupational Therapy

## 2016-01-30 ENCOUNTER — Ambulatory Visit (INDEPENDENT_AMBULATORY_CARE_PROVIDER_SITE_OTHER): Payer: Medicare Other | Admitting: Family Medicine

## 2016-01-30 ENCOUNTER — Encounter: Payer: Self-pay | Admitting: Family Medicine

## 2016-01-30 ENCOUNTER — Ambulatory Visit: Payer: Self-pay | Admitting: Physical Therapy

## 2016-01-30 VITALS — BP 124/72 | HR 72 | Temp 98.1°F | Wt 154.8 lb

## 2016-01-30 DIAGNOSIS — N183 Chronic kidney disease, stage 3 unspecified: Secondary | ICD-10-CM

## 2016-01-30 DIAGNOSIS — G629 Polyneuropathy, unspecified: Secondary | ICD-10-CM

## 2016-01-30 DIAGNOSIS — R8290 Unspecified abnormal findings in urine: Secondary | ICD-10-CM

## 2016-01-30 DIAGNOSIS — R809 Proteinuria, unspecified: Secondary | ICD-10-CM | POA: Diagnosis not present

## 2016-01-30 DIAGNOSIS — M3500 Sicca syndrome, unspecified: Secondary | ICD-10-CM

## 2016-01-30 LAB — POC URINALSYSI DIPSTICK (AUTOMATED)
Blood, UA: NEGATIVE
GLUCOSE UA: NEGATIVE
KETONES UA: NEGATIVE
LEUKOCYTES UA: NEGATIVE
Nitrite, UA: NEGATIVE
Urobilinogen, UA: 1
pH, UA: 5.5

## 2016-01-30 MED ORDER — GABAPENTIN 300 MG PO CAPS: 300.0000 mg | ORAL_CAPSULE | ORAL | 1 refills | Status: DC

## 2016-01-30 NOTE — Assessment & Plan Note (Signed)
UA on Friday with 2+ LE but micro was not performed. No signs of UTI by story or on exam today. Discussed avoidance of bladder irritants with patient, encouraged increased hydration as UA was concentrated today.

## 2016-01-30 NOTE — Progress Notes (Signed)
Pre visit review using our clinic review tool, if applicable. No additional management support is needed unless otherwise documented below in the visit note. 

## 2016-01-30 NOTE — Assessment & Plan Note (Signed)
Endorses worsening neuropathy especially at night time. Will increase gabapentin to 300/300/600mg  daily.

## 2016-01-30 NOTE — Assessment & Plan Note (Signed)
Cr bumped up to 1.43 (GFR 45%) on testing on Friday with persistent proteinuria - will order 24 hour urine protein collection. Previously saw Dr Harden Mo nephrologist.

## 2016-01-30 NOTE — Patient Instructions (Addendum)
Urine looking ok today - no signs of infection. Let us know if new UTI symptoms develop. Increase water intake as your urine looked a bit concentrated.  Pass by lab to pick up 24 hour urine collection kit to check protein levels.  Return in Spring 2018 for follow up visit

## 2016-01-30 NOTE — Progress Notes (Signed)
BP 124/72   Pulse 72   Temp 98.1 F (36.7 C) (Oral)   Wt 154 lb 12 oz (70.2 kg)   BMI 28.30 kg/m    CC: UTI Subjective:    Patient ID: Alexandria Leach, female    DOB: Dec 27, 1950, 65 y.o.   MRN: [id number]  HPI: WEYLYN LOWHORN is a 65 y.o. female presenting on 01/30/2016 for Urinary Tract Infection (per Rheumatology-no symptoms)   Recent labs by rheumatology - CBC WNL, CMP with glu 139, Cr 1.43 (GFR 45%), UA with 1+ protein and 2+ leukocytes. Advised see PCP for further evaluation. Pt denies dysuria, urgency/frequency, fevers/chills, nausea, flank pain, hematuria. Some incomplete emptying (not new), some lower abdominal discomfort after voiding. No recent antibiotic use. Denies diarrhea.   Ongoing neuropathy pain Known sjogren's syndrome.  Has seen Dr Harden Mo nephrologist - last ~2015. Released from her care due to stability of kidney disease.  Relevant past medical, surgical, family and social history reviewed and updated as indicated. Interim medical history since our last visit reviewed. Allergies and medications reviewed and updated. Current Outpatient Prescriptions on File Prior to Visit  Medication Sig  . ALPRAZolam (XANAX) 1 MG tablet Take 1 tablet (1 mg total) by mouth 3 (three) times daily as needed for anxiety.  Marland Kitchen amLODipine (NORVASC) 10 MG tablet Take 1 tablet (10 mg total) by mouth daily.  Marland Kitchen antiseptic oral rinse (BIOTENE) LIQD 15 mLs by Mouth Rinse route at bedtime.   Marland Kitchen aspirin 325 MG EC tablet Take 325 mg by mouth daily.   . cholecalciferol (VITAMIN D) 1000 UNITS tablet Take 1,000 Units by mouth daily.   . cycloSPORINE (RESTASIS) 0.05 % ophthalmic emulsion Place 1 drop into both eyes 2 (two) times daily.  . diclofenac sodium (VOLTAREN) 1 % GEL Apply 1 application topically 2 (two) times daily as needed (pain).  Marland Kitchen escitalopram (LEXAPRO) 20 MG tablet Take 1 tablet (20 mg total) by mouth 2 (two) times daily.  Marland Kitchen ezetimibe (ZETIA) 10 MG tablet Take 1 tablet (10 mg total)  by mouth at bedtime.  . famotidine (PEPCID) 20 MG tablet Take 1 tablet by mouth two  times daily  . furosemide (LASIX) 20 MG tablet TAKE 1 TABLET BY MOUTH  DAILY AS NEEDED FOR EDEMA. (Patient taking differently: TAKE 1 TABLET BY MOUTH  DAILY AS NEEDED FOR WEIGHT GAIN OF 3 LBS IN 24 HOURS)  . hydrochlorothiazide (MICROZIDE) 12.5 MG capsule Take 1 capsule (12.5 mg total) by mouth daily.  . hydroxychloroquine (PLAQUENIL) 200 MG tablet Take 200 mg by mouth daily.   Marland Kitchen lovastatin (MEVACOR) 10 MG tablet Take 1 tablet (10 mg total) by mouth every Monday, Wednesday, and Friday.  . metoprolol succinate (TOPROL-XL) 50 MG 24 hr tablet Take 1 tablet (50 mg total) by mouth 2 (two) times daily. Take with or immediately following a meal.  . pilocarpine (SALAGEN) 5 MG tablet Take 2.5-5 mg by mouth 2 (two) times daily as needed. Take 1 tablet (5 mg) by mouth every morning and 1/2 tablet (2.5 mg) at 2-2:30pm if needed for Sjogren syndrome  . Polyethyl Glycol-Propyl Glycol (SYSTANE) 0.4-0.3 % GEL Place 1 drop into both eyes 2 (two) times daily.   . traMADol (ULTRAM) 50 MG tablet Take 1 tablet (50 mg total) by mouth every 12 (twelve) hours as needed for severe pain.  Marland Kitchen zolpidem (AMBIEN CR) 12.5 MG CR tablet Take 1 tablet (12.5 mg total) by mouth at bedtime as needed for sleep.   No current facility-administered  medications on file prior to visit.     Review of Systems Per HPI unless specifically indicated in ROS section     Objective:    BP 124/72   Pulse 72   Temp 98.1 F (36.7 C) (Oral)   Wt 154 lb 12 oz (70.2 kg)   BMI 28.30 kg/m   Wt Readings from Last 3 Encounters:  01/30/16 154 lb 12 oz (70.2 kg)  12/08/15 155 lb 8 oz (70.5 kg)  11/30/15 152 lb 8 oz (69.2 kg)    Physical Exam  Constitutional: She appears well-developed and well-nourished. No distress.  HENT:  Mouth/Throat: Oropharynx is clear and moist. No oropharyngeal exudate.  Cardiovascular: Normal rate, regular rhythm and intact distal  pulses.   Murmur (3/6 SEM) heard. Pulmonary/Chest: Effort normal and breath sounds normal. No respiratory distress. She has no wheezes. She has no rales.  Abdominal: Soft. Normal appearance and bowel sounds are normal. She exhibits no distension and no mass. There is no hepatosplenomegaly. There is no tenderness. There is no rigidity, no rebound, no guarding, no CVA tenderness and negative Murphy's sign.  Musculoskeletal: She exhibits no edema.  Skin: Skin is warm and dry. No rash noted.  Psychiatric: She has a normal mood and affect.  Nursing note and vitals reviewed.  Results for orders placed or performed in visit on 01/30/16  POCT Urinalysis Dipstick (Automated)  Result Value Ref Range   Color, UA Dark Yellow    Clarity, UA Clear    Glucose, UA Negative    Bilirubin, UA 1+    Ketones, UA Negative    Spec Grav, UA >=1.030    Blood, UA Negative    pH, UA 5.5    Protein, UA 1+    Urobilinogen, UA 1.0    Nitrite, UA Negative    Leukocytes, UA Negative Negative      Assessment & Plan:   Problem List Items Addressed This Visit    Abnormal urinalysis - Primary    UA on Friday with 2+ LE but micro was not performed. No signs of UTI by story or on exam today. Discussed avoidance of bladder irritants with patient, encouraged increased hydration as UA was concentrated today.       Relevant Orders   POCT Urinalysis Dipstick (Automated) (Completed)   CKD (chronic kidney disease) stage 3, GFR 30-59 ml/min    Cr bumped up to 1.43 (GFR 45%) on testing on Friday with persistent proteinuria - will order 24 hour urine protein collection. Previously saw Dr Harden Mo nephrologist.       Relevant Orders   Protein, urine, 24 hour   Neuropathy ([place])    Endorses worsening neuropathy especially at night time. Will increase gabapentin to 300/300/600mg  daily.      Sjogren's syndrome ([place])    Other Visit Diagnoses    Proteinuria, unspecified type       Relevant Orders   Protein, urine, 24 hour         Follow up plan: Return in about 4 months (around 05/29/2016) for follow up visit.  Ria Bush, MD

## 2016-02-01 ENCOUNTER — Other Ambulatory Visit: Payer: Self-pay | Admitting: Family Medicine

## 2016-02-01 ENCOUNTER — Encounter: Payer: Self-pay | Admitting: Occupational Therapy

## 2016-02-01 ENCOUNTER — Ambulatory Visit: Payer: Self-pay | Admitting: Physical Therapy

## 2016-02-01 DIAGNOSIS — N183 Chronic kidney disease, stage 3 unspecified: Secondary | ICD-10-CM

## 2016-02-01 DIAGNOSIS — R809 Proteinuria, unspecified: Secondary | ICD-10-CM | POA: Diagnosis not present

## 2016-02-02 LAB — PROTEIN, URINE, 24 HOUR: Protein, Urine: <4 mg/dL — ABNORMAL LOW (ref 5–24)

## 2016-02-05 ENCOUNTER — Ambulatory Visit: Payer: Self-pay | Admitting: Physical Therapy

## 2016-02-05 ENCOUNTER — Encounter: Payer: Self-pay | Admitting: Occupational Therapy

## 2016-02-06 ENCOUNTER — Encounter: Payer: Self-pay | Admitting: Occupational Therapy

## 2016-02-06 ENCOUNTER — Ambulatory Visit: Payer: Self-pay | Admitting: Physical Therapy

## 2016-02-07 ENCOUNTER — Other Ambulatory Visit: Payer: Self-pay | Admitting: Rheumatology

## 2016-02-07 NOTE — Telephone Encounter (Signed)
Patient is having a lot of burning/tingling in bother her legs due to her neuropathy. Patient would like to increase her gabapentin 300mg  from 1 table to 2 daily.

## 2016-02-07 NOTE — Telephone Encounter (Signed)
I have called patient. We do not prescribe her Gabapentin for her. I have advised her to call her PCP, it appears he prescribes this for her.

## 2016-02-13 ENCOUNTER — Telehealth: Payer: Self-pay | Admitting: *Deleted

## 2016-02-13 MED ORDER — GABAPENTIN 300 MG PO CAPS: 300.0000 mg | ORAL_CAPSULE | ORAL | 1 refills | Status: DC

## 2016-02-13 NOTE — Telephone Encounter (Signed)
Patient called wanting to know if she can increase her morning dose of Gabapentin and take two instead of one? Patient stated that her neuropathy is really bothering her. Patient stated that she took her blood sugar this morning and it was 109.  Please advise.

## 2016-02-13 NOTE — Telephone Encounter (Signed)
Ok to increase to 2 in am, 1 in afternoon, 2 at night. Update [place] with effect.

## 2016-02-13 NOTE — Telephone Encounter (Signed)
Spoke to pt. She will let us know how she is doing and will need a new rx sent to [facility] after that.

## 2016-02-14 ENCOUNTER — Other Ambulatory Visit: Payer: Self-pay | Admitting: Family Medicine

## 2016-02-15 NOTE — Therapy (Signed)
[facility] [address] [place], [place], [address] Phone: [phone]   Fax:  [phone]  Patient Details  Name: Alexandria Leach MRN: [id number] Date of Birth: [birth year] Referring Provider:  No ref. provider found  Encounter Date: 02/15/2016   PHYSICAL THERAPY DISCHARGE SUMMARY  Visits from Start of Care: 1  Current functional level related to goals / functional outcomes:    PT Long Term Goals - 12/13/15 1814            PT LONG TERM GOAL #1   Title Pt will be independent and verbalize understanding of HEP and on-going fitness plan to maintain progress made in therapy and improve overall health. (Target Date for all LTGs: 02/08/17)   Time 8   Period Weeks   Status New       PT LONG TERM GOAL #2   Title Pt will improve TUG score with LRAD to <13.5 sec to indicate low fall risk.   Time 8   Period Weeks   Status New       PT LONG TERM GOAL #3   Title Pt will improve Berg Balance score to >45/56 to indicate decr fall risk.   Time 8   Period Weeks   Status New       PT LONG TERM GOAL #4   Title Pt will negotiate 4 stairs with mod I and LRAD with unilat UE support to incr safety entering/exiting her home.   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   Time 8   Period Weeks   Status New        Remaining deficits: Unknown, as pt never returned after initial eval. She cancelled remaining appt's 2/2 pain, per OT's. Note.   Education / Equipment: eval only  Plan: Patient agrees to discharge.  Patient goals were not met. Patient is being discharged due to not returning since the last visit.  ?????       Kayelee Herbig L 02/15/2016, 3:16 PM  [facility] [address] [place], [place], [address] Phone: [phone]   Fax:  [phone]   Geoffry Paradise, PT,DPT 02/15/16 3:17 PM Phone: [phone] Fax: [phone]

## 2016-02-23 ENCOUNTER — Encounter (HOSPITAL_COMMUNITY): Payer: Self-pay | Admitting: Psychiatry

## 2016-02-23 ENCOUNTER — Ambulatory Visit (INDEPENDENT_AMBULATORY_CARE_PROVIDER_SITE_OTHER): Payer: Medicare Other | Admitting: Psychiatry

## 2016-02-23 VITALS — BP 117/70 | HR 65 | Ht 62.0 in | Wt 160.0 lb

## 2016-02-23 DIAGNOSIS — Z79899 Other long term (current) drug therapy: Secondary | ICD-10-CM

## 2016-02-23 DIAGNOSIS — Z882 Allergy status to sulfonamides status: Secondary | ICD-10-CM | POA: Diagnosis not present

## 2016-02-23 DIAGNOSIS — Z833 Family history of diabetes mellitus: Secondary | ICD-10-CM

## 2016-02-23 DIAGNOSIS — Z823 Family history of stroke: Secondary | ICD-10-CM

## 2016-02-23 DIAGNOSIS — Z8249 Family history of ischemic heart disease and other diseases of the circulatory system: Secondary | ICD-10-CM

## 2016-02-23 DIAGNOSIS — Z888 Allergy status to other drugs, medicaments and biological substances status: Secondary | ICD-10-CM | POA: Diagnosis not present

## 2016-02-23 DIAGNOSIS — Z841 Family history of disorders of kidney and ureter: Secondary | ICD-10-CM

## 2016-02-23 DIAGNOSIS — Z801 Family history of malignant neoplasm of trachea, bronchus and lung: Secondary | ICD-10-CM

## 2016-02-23 DIAGNOSIS — F331 Major depressive disorder, recurrent, moderate: Secondary | ICD-10-CM

## 2016-02-23 MED ORDER — ALPRAZOLAM 1 MG PO TABS: 1.0000 mg | ORAL_TABLET | Freq: Three times a day (TID) | ORAL | 1 refills | Status: DC | PRN

## 2016-02-23 MED ORDER — ZOLPIDEM TARTRATE ER 12.5 MG PO TBCR
12.5000 mg | EXTENDED_RELEASE_TABLET | Freq: Every evening | ORAL | 2 refills | Status: DC | PRN
Start: 2016-02-23 — End: 2016-04-17

## 2016-02-23 MED ORDER — ESCITALOPRAM OXALATE 20 MG PO TABS: 20.0000 mg | ORAL_TABLET | Freq: Two times a day (BID) | ORAL | 2 refills | Status: DC
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
## 2016-02-28 ENCOUNTER — Ambulatory Visit: Payer: Medicare Other | Attending: Diagnostic Neuroimaging | Admitting: Audiology

## 2016-02-28 DIAGNOSIS — H93299 Other abnormal auditory perceptions, unspecified ear: Secondary | ICD-10-CM | POA: Diagnosis not present

## 2016-02-28 DIAGNOSIS — H9041 Sensorineural hearing loss, unilateral, right ear, with unrestricted hearing on the contralateral side: Secondary | ICD-10-CM | POA: Diagnosis not present

## 2016-02-28 DIAGNOSIS — R292 Abnormal reflex: Secondary | ICD-10-CM

## 2016-02-28 DIAGNOSIS — H748X3 Other specified disorders of middle ear and mastoid, bilateral: Secondary | ICD-10-CM | POA: Diagnosis not present

## 2016-02-28 DIAGNOSIS — IMO0001 Reserved for inherently not codable concepts without codable children: Secondary | ICD-10-CM

## 2016-02-28 NOTE — Procedures (Signed)
**Note Alexandria-Identified via Obfuscation** [facility] [place]  [place],  [address]  [phone]   Audiological Evaluation  Patient Name: Alexandria Leach   Status: Outpatient   DOB: 05-Mar-1951    Diagnosis: Hearing Loss right ear MRN: [id number] Date:  02/28/2016     Referent: Ria Bush, MD  History: Alexandria Leach was seen for an audiological evaluation. 2008 in [place] was identified with Sygones syndrome, mentioned to MD's about the hearing loss and she was taken off Placquin.  In 2012 a physician restarted Placquin.     Primary Concern: Right ear hearing loss.  In 2008 "30% of hearing was lost".  Sometimes right ear heariing closes up and then "opens up".  During annual physical last month was told there "was almost no hearing on the right side".  The left ear is "ok".  History of ear infections:  N History of ear surgery or "tubes" : N History of dizziness/vertigo:  N History of balance issues:  Y - lost balance and broke left ankle February 22, 2015 - but thinks the fall was medication related. Tinnitus: N  Sound sensitivity: Y - on the right side only. History of occupational noise exposure: N History of hypertension: Y History of diabetes:  N Family history of hearing loss: Sister developed unilateral hearing loss in right ear at age 108 and "wears a hearing aid". Other concerns: Diagnosed with fibromyalcia, Synones Syndrome, neuropathy, arthritis. Medications:    Evaluation: Conventional pure tone audiometry from 250Hz  - 8000Hz  with using insert earphones.  Hearing Thresholds: Right ear:  Thresholds of 95-110 dBHL.  Please note there is a masking dilmena so that right ear hearing thresholds may actually be worse (left ear masking of 90dBHL used)  Left ear:    Thresholds of 15-25 dBHL Reliability is good Speech reception levels (repeating words near threshold) using recorded spondee word lists:  Right ear: None.  Speech detection 95dBHL  Left ear:   20 dBHL Word recognition (at comfortably loud volumes) using recorded NU-6word lists at [facility], in quiet.  Right ear: 0% - sounds staticy but not clear at loud levels - UCL at 85dBHL using speech noise.  Left ear:   92% at 60 dBHL Tympanometry (middle ear function) shows normal middle ear volume, pressure with shallow compliance bilaterally (Type As)  Right ear: Normal (Type As) with absent acoustic reflexes from 500Hz  - 4000Hz .  Left ear: Normal (Type As) with present acoustic reflex at 1000Hz .  CONCLUSION:      Alexandria Leach needs further testing to rule out retrocochlear pathology on the right side (i.e. MRI) and/or referral to an ENT.  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  The left ear has normal hearing thresholds.  Middle ear function is slightly shallow bilaterally, but is within normal limits.  There is no useable word recognition in the right ear (0%) at extremely loud levels and Alexandria Leach reports the quality of sound is "staticy" and uncomfortably loud.  Recruitment is present on the right side.  the left ear has excellent word recognition at conversational speech levels.   With the unilateral hearing loss Alexandria Leach may be a candidate for a CROS type of hearing aid that would allow her to hear sound on the right side in her left ear. Therefore a hearing aid  evaluation is recommended.   RECOMMENDATIONS: 1.   Rule out retrocochlear issues on the right side with further testing such as an MRI and/or referral to an ENT. 2.   Strategies that help improve hearing include: A) Face the speaker directly. Optimal is having the speakers face well - lit.  Unless amplified, being within 3-6 feet of the speaker will enhance word recognition. B) Avoid having the speaker back-lit as this will minimize the ability to use cues from  lip-reading, facial expression and gestures. C)  Word recognition is poorer in background noise. For optimal word recognition, turn off the TV, radio or noisy fan when engaging in conversation. In a restaurant, try to sit away from noise sources and close to the primary speaker.  D)  Ask for topic clarification from time to time in order to remain in the conversation.  Most people don't mind repeating or clarifying a point when asked.  If needed, explain the difficulty hearing in background noise or hearing loss.  3.   A hearing aid evaluation to evaluate options to improve hearing.  Alexandria Leach L. Heide Spark, Au.D., CCC-A Doctor of Audiology 02/28/2016

## 2016-03-03 ENCOUNTER — Telehealth: Payer: Self-pay | Admitting: Family Medicine

## 2016-03-03 DIAGNOSIS — H918X9 Other specified hearing loss, unspecified ear: Secondary | ICD-10-CM

## 2016-03-03 DIAGNOSIS — R292 Abnormal reflex: Secondary | ICD-10-CM

## 2016-03-03 NOTE — Telephone Encounter (Signed)
plz notify patient - received audiology note about abnormal hearing test - rec further evaluation with ENT referral, placed.

## 2016-03-05 NOTE — Telephone Encounter (Signed)
Appt made with Dr Wilburn Cornelia and patient notig=fird.

## 2016-03-20 ENCOUNTER — Other Ambulatory Visit: Payer: Self-pay | Admitting: Family Medicine

## 2016-03-22 ENCOUNTER — Other Ambulatory Visit: Payer: Self-pay | Admitting: Family Medicine

## 2016-03-25 DIAGNOSIS — H918X1 Other specified hearing loss, right ear: Secondary | ICD-10-CM | POA: Diagnosis not present

## 2016-03-25 DIAGNOSIS — H918X3 Other specified hearing loss, bilateral: Secondary | ICD-10-CM | POA: Insufficient documentation

## 2016-03-27 ENCOUNTER — Encounter: Payer: Self-pay | Admitting: Family Medicine

## 2016-04-01 ENCOUNTER — Telehealth (HOSPITAL_COMMUNITY): Payer: Self-pay | Admitting: *Deleted

## 2016-04-01 NOTE — Telephone Encounter (Signed)
Per pt her Ambien in not covered under her insurance which sound like a P.A. Message will be sent to [facility] to take a look at it.

## 2016-04-01 NOTE — Telephone Encounter (Signed)
voice message from patient, said Ambien is not covered under her insurance.

## 2016-04-04 ENCOUNTER — Telehealth (HOSPITAL_COMMUNITY): Payer: Self-pay | Admitting: *Deleted

## 2016-04-04 NOTE — Telephone Encounter (Signed)
Called patients insurance to inquire about Ambien authorization. Was told that the alternatives needed to be tried or have a reason as to why they are contraindicated before approval can be made. They are faxing a list of alternatives to be reviewed so that a denial is not given.

## 2016-04-09 NOTE — Telephone Encounter (Signed)
Called patients insurance to inquire about Ambien authorization. Was told that the alternatives needed to be tried or have a reason as to why they are contraindicated before approval can be made. They are faxing a list of alternatives to be reviewed so that a denial is not given.

## 2016-04-10 ENCOUNTER — Telehealth (HOSPITAL_COMMUNITY): Payer: Self-pay | Admitting: *Deleted

## 2016-04-10 NOTE — Telephone Encounter (Signed)
Received letter from insurance asking to try formulary alternatives such as: Trazodone, Zolpidem IR, Zaleplon, Belsomra, or Rozerem. If not then they need reason as to why Zolpidem CR is required.

## 2016-04-11 NOTE — Telephone Encounter (Signed)
I can't find record of these others. Please ask patient if she wants to try regular [place]

## 2016-04-15 NOTE — Telephone Encounter (Signed)
You may call in Ambien 10 mg # 30 . One at bedtime, 2 refills

## 2016-04-15 NOTE — Telephone Encounter (Signed)
Called pt due to previous notes in chart. Per pt she would like to try the regular Ambien instead to see if that would work.

## 2016-04-17 ENCOUNTER — Telehealth (HOSPITAL_COMMUNITY): Payer: Self-pay | Admitting: *Deleted

## 2016-04-17 MED ORDER — ZOLPIDEM TARTRATE 10 MG PO TABS: 10.0000 mg | ORAL_TABLET | Freq: Every evening | ORAL | 0 refills | Status: DC | PRN

## 2016-04-17 NOTE — Telephone Encounter (Signed)
Called in script to pt pharmacy and spoke with Aloha Gell

## 2016-04-17 NOTE — Telephone Encounter (Signed)
Per Dr. Harrington Challenger to call in Zolpidem 10 mg QD for pt. Called pt pharmacy and spoke with Aloha Gell the pharmacist who verbalized understanding.

## 2016-04-22 ENCOUNTER — Ambulatory Visit (INDEPENDENT_AMBULATORY_CARE_PROVIDER_SITE_OTHER): Payer: Medicare Other | Admitting: Rheumatology

## 2016-04-22 ENCOUNTER — Encounter: Payer: Self-pay | Admitting: Rheumatology

## 2016-04-22 VITALS — BP 120/70 | HR 66 | Resp 14 | Ht 61.0 in | Wt 167.0 lb

## 2016-04-22 DIAGNOSIS — Z79899 Other long term (current) drug therapy: Secondary | ICD-10-CM

## 2016-04-22 DIAGNOSIS — M8588 Other specified disorders of bone density and structure, other site: Secondary | ICD-10-CM | POA: Diagnosis not present

## 2016-04-22 DIAGNOSIS — M5030 Other cervical disc degeneration, unspecified cervical region: Secondary | ICD-10-CM | POA: Diagnosis not present

## 2016-04-22 DIAGNOSIS — G4709 Other insomnia: Secondary | ICD-10-CM

## 2016-04-22 DIAGNOSIS — M797 Fibromyalgia: Secondary | ICD-10-CM

## 2016-04-22 DIAGNOSIS — M3500 Sicca syndrome, unspecified: Secondary | ICD-10-CM

## 2016-04-22 DIAGNOSIS — R5382 Chronic fatigue, unspecified: Secondary | ICD-10-CM | POA: Diagnosis not present

## 2016-04-22 LAB — CBC WITH DIFFERENTIAL/PLATELET
BASOS PCT: 1 %
Basophils Absolute: 37 cells/uL (ref 0–200)
EOS PCT: 3 %
Eosinophils Absolute: 111 cells/uL (ref 15–500)
HCT: 39.2 % (ref 35.0–45.0)
HEMOGLOBIN: 12.7 g/dL (ref 11.7–15.5)
LYMPHS ABS: 1406 {cells}/uL (ref 850–3900)
Lymphocytes Relative: 38 %
MCH: 27.5 pg (ref 27.0–33.0)
MCHC: 32.4 g/dL (ref 32.0–36.0)
MCV: 84.8 fL (ref 80.0–100.0)
MPV: 10.3 fL (ref 7.5–12.5)
Monocytes Absolute: 407 cells/uL (ref 200–950)
Monocytes Relative: 11 %
NEUTROS PCT: 47 %
Neutro Abs: 1739 cells/uL (ref 1500–7800)
Platelets: 196 10*3/uL (ref 140–400)
RBC: 4.62 MIL/uL (ref 3.80–5.10)
RDW: 14.9 % (ref 11.0–15.0)
WBC: 3.7 10*3/uL — AB (ref 3.8–10.8)

## 2016-04-22 LAB — COMPLETE METABOLIC PANEL WITH GFR
ALT: 13 U/L (ref 6–29)
AST: 27 U/L (ref 10–35)
Albumin: 4.3 g/dL (ref 3.6–5.1)
Alkaline Phosphatase: 48 U/L (ref 33–130)
BUN: 12 mg/dL (ref 7–25)
CHLORIDE: 105 mmol/L (ref 98–110)
CO2: 26 mmol/L (ref 20–31)
Calcium: 9.7 mg/dL (ref 8.6–10.4)
Creat: 1.12 mg/dL — ABNORMAL HIGH (ref 0.50–0.99)
GFR, Est African American: 60 mL/min (ref >=60)
GFR, Est Non African American: 52 mL/min — ABNORMAL LOW (ref >=60)
Glucose, Bld: 83 mg/dL (ref 65–99)
POTASSIUM: 4.1 mmol/L (ref 3.5–5.3)
Sodium: 138 mmol/L (ref 135–146)
Total Bilirubin: 0.4 mg/dL (ref 0.2–1.2)
Total Protein: 7.3 g/dL (ref 6.1–8.1)

## 2016-04-22 MED ORDER — METHOCARBAMOL 500 MG PO TABS: 500.0000 mg | ORAL_TABLET | Freq: Two times a day (BID) | ORAL | 1 refills | Status: DC | PRN

## 2016-04-22 NOTE — Progress Notes (Signed)
Office Visit Note  Patient: Alexandria Leach             Date of Birth: [birth year]           MRN: [id number]             PCP: Ria Bush, MD Referring: Ria Bush, MD Visit Date: 04/22/2016 Occupation: '@GUAROCC' @    Subjective:  Follow-up Sjogren's,  high-risk prescription, fms  History of Present Illness: Alexandria Leach is a 66 y.o. female  Last seen 11/22/2015 Doing well with Sjogren's overall. Getting some relief of pilocarpine, Restasis, Systane, GenTeal. Patient continues to have ongoing issues with the mouth dryness at night especially.  Taking Plaquenil as prescribed at 200 mg twice a day Monday through Friday. Pilocarpine as needed 3 times a day. Patient's last Plaquenil eye exam was 08/15/2015 and [facility]. She has a repeat exam scheduled for May 2018.  Patient's main complaint today is the fibromyalgia is out of control. She rates her discomfort as 10 on a scale of 0-10 with equally problematic fatigue and insomnia.  She seen her PCP who is increased her gabapentin to the following dose 2 pills in the morning, 1 pill midday, 2 pills at night. She just started this new schedule last week. She does not know yet if it will be effective. She has a follow-up appointment March 2018.  She also reports that Robaxin did well but her insurance refuses to pay for the medication. We discussed strategies on obtaining the Robaxin and patient is agreeable to restart the Robaxin.  She does do water aerobics. She goes 2 times a week at her son's indoor swimming pool This is helping her some.  Activities of Daily Living:  Patient reports morning stiffness for 30 minutes.   Patient Reports nocturnal pain.  Difficulty dressing/grooming: Reports Difficulty climbing stairs: Reports Difficulty getting out of chair: Reports Difficulty using hands for taps, buttons, cutlery, and/or writing: Reports   Review of Systems  Constitutional: Negative for  fatigue.  HENT: Negative for mouth sores and mouth dryness.   Eyes: Negative for dryness.  Respiratory: Negative for shortness of breath.   Gastrointestinal: Negative for constipation and diarrhea.  Musculoskeletal: Negative for myalgias and myalgias.  Skin: Negative for sensitivity to sunlight.  Psychiatric/Behavioral: Negative for decreased concentration and sleep disturbance.    PMFS History:  Patient Active Problem List   Diagnosis Date Noted  . Neuropathy ([place]) 01/30/2016  . DNR no code (do not resuscitate) 12/08/2015  . 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 TIA (transient ischemic attack) 10/12/2015  . Abnormal urinalysis 10/12/2015  . Mild mitral regurgitation 10/12/2015  . Osteopenia 09/18/2015  . Right arm pain 08/29/2015  . Fall   . Trimalleolar fracture of left ankle 02/23/2015  . Right sided sciatica 02/21/2015  . Imbalance 01/12/2015  . Transaminitis 12/24/2014  . DDD (degenerative disc disease), cervical   . Health maintenance examination 10/18/2014  . Advanced care planning/counseling discussion 10/18/2014  . Medicare annual wellness visit, initial 10/18/2014  . Refusal of blood transfusions as patient is Jehovah's Witness   . Sjogren's syndrome ([place])   . CKD (chronic kidney disease) stage 3, GFR 30-59 ml/min   . Glaucoma   . Fibromyalgia   . Osteoarthritis   . History of pulmonary embolism   . Lung nodule 09/22/2012  . Diastolic CHF ([place]) 21/22/4825  . Aortic stenosis 04/22/2011  . Palpitations 08/10/2010  . Dyspnea 08/10/2010  . HOT FLASHES 06/28/2008  . Trochanteric  bursitis of right hip 06/28/2008  . ARTHRALGIA 07/01/2007  . BACK PAIN, LEFT 08/25/2006  . GAD (generalized anxiety disorder) 03/28/2006  . MITRAL VALVE REPLACEMENT, HX OF 03/28/2006  . TAH/BSO, HX OF 03/28/2006  . HYPERCHOLESTEROLEMIA 12/31/2005  . MDD (major depressive disorder), recurrent episode ([place]) 12/31/2005  . RHEUMATIC HEART DISEASE 12/31/2005  .  Essential hypertension 12/31/2005  . Chronic insomnia 12/31/2005    Past Medical History:  Diagnosis Date  . Ankle fracture, left 02/23/2015  . Ankle fracture, left 02/19/2015   from a fall  . Anxiety   . CHF (congestive heart failure) ([place])   . CKD (chronic kidney disease) stage 3, GFR 30-59 ml/min    saw nephrologist Dr Harden Mo  . DDD (degenerative disc disease), cervical   . Depression   . Fibromyalgia   . Glaucoma    s/p surgery, sees ophtho Q6 mo  . 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 History of kidney stones 2010  . History of pulmonary embolism 2001, 2006   completed coumadin courses  . History of rheumatic fever x3  . HLD (hyperlipidemia)   . HTN (hypertension)   . Insomnia   . Lung nodule 09/22/2012   RLL - 58m, stable since 2014. Thought benign.   . Osteoarthritis    shoulders and knees, not RA per Dr DEstanislado Pandy positive ANA, positive Ro  . Osteopenia 09/18/2015   DEXA T -1.1 hip, -0.2 spine 08/2015   . Personal history of urinary calculi latest 2014  . Pneumonia 12/02/2011  . PONV (postoperative nausea and vomiting)   . Refusal of blood transfusions as patient is Jehovah's Witness   . Rheumatic heart disease 1980   s/p mitral valve repair 1980  . [place]     Family History  Problem Relation Age of Onset  . Lupus Sister     and niece  . Cancer Mother     lung (nonsmoker)  . CAD Mother     MI in her 658s . ALS Mother   . Kidney disease Father   . Alcohol abuse Father   . Diabetes Father   . Cancer Brother     bone  . Diabetes Sister   . Stroke Sister   . Cancer Maternal Uncle     bone  . Depression Sister   . Kidney failure Other     on HD  . Diabetes Brother   . Heart attack Brother   . Stroke Maternal Grandmother    Past Surgical History:  Procedure Laterality Date  . BREAST BIOPSY Right 2006   benign  . CHOLECYSTECTOMY  11/27/2011   Procedure: LAPAROSCOPIC  CHOLECYSTECTOMY WITH INTRAOPERATIVE CHOLANGIOGRAM;  Surgeon: HAdin Hector MD;  [place]  Service: General;  Laterality: N/A;  laparoscopic cholecystectomy with choleangiogram umbilical hernia repair  . COLONOSCOPY  07/2014   WNL (Amedeo Plenty  . dexa  08/2012   normal per patient - no records available  . MITRAL VALVE REPAIR  1980   open heart  . ORIF ANKLE FRACTURE Left 02/26/2015   Procedure: OPEN REDUCTION INTERNAL FIXATION (ORIF) LEFT TRIMALLEOLAR ANKLE FRACTURE;  Surgeon: NLeandrew Koyanagi MD;  [place] [facility]  Service: Orthopedics;  Laterality: Left;  . TUBAL LIGATION  1980  . UMBILICAL HERNIA REPAIR  11/27/2011   Procedure: HERNIA REPAIR UMBILICAL ADULT;  Surgeon: HAdin Hector MD;  [place] [facility]  Service: General;  Laterality: N/A;  .  VAGINAL HYSTERECTOMY  1992   for fibroids -- partial, ovaries remain   Social History   Social History Narrative   Lives with son, 1 dog   Occupation: unemployed, on disability for fibromyalgia since 2008.   Edu: HS   Religion: Jehova's witness   Activity: volunteers at senior center   Diet: some water, fruits/vegetables daily   No caffeine use     Objective: Vital Signs: BP 120/70   Pulse 66   Resp 14   Ht '5\' 1"'  (1.549 m)   Wt 167 lb (75.8 kg)   BMI 31.55 kg/m    Physical Exam  Constitutional: She is oriented to person, place, and time. She appears well-developed and well-nourished.  HENT:  Head: Normocephalic and atraumatic.  Eyes: EOM are normal. Pupils are equal, round, and reactive to light.  Cardiovascular: Normal rate, regular rhythm and normal heart sounds.  Exam reveals no gallop and no friction rub.   No murmur heard. Pulmonary/Chest: Effort normal and breath sounds normal. She has no wheezes. She has no rales.  Abdominal: Soft. Bowel sounds are normal. She exhibits no distension. There is no tenderness. There is no guarding. No hernia.  Musculoskeletal: Normal range of motion. She exhibits no edema, tenderness or  deformity.  Lymphadenopathy:    She has no cervical adenopathy.  Neurological: She is alert and oriented to person, place, and time. Coordination normal.  Skin: Skin is warm and dry. Capillary refill takes less than 2 seconds. No rash noted.  Psychiatric: She has a normal mood and affect. Her behavior is normal.  Nursing note and vitals reviewed.    Musculoskeletal Exam:  Full range of motion of all joints Grip strength is equal and strong bilaterally Fiber myalgia tender points are 16 out of 18 positive. Patient's trapezius muscles are not painful to palpation.  CDAI Exam: CDAI Homunculus Exam:   Joint Counts:  CDAI Tender Joint count: 0 CDAI Swollen Joint count: 0   No synovitis on examination Most of patient's pain is coming from her fibromyalgia discomfort  Investigation: No additional findings. Lab on 02/01/2016  Component Date Value Ref Range Status  . Protein, Urine 02/01/2016 <4* 5 - 24 mg/dL Final  . Protein, 24H Urine 02/01/2016 SEE NOTE  <150 mg/24 h Final   Comment: The protein value is less than 4 mg/dL. Therefore we are unable to calculate excretion and/or Creatinine ratio.   Office Visit on 01/30/2016  Component Date Value Ref Range Status  . Color, UA 01/30/2016 Dark Yellow   Final  . Clarity, UA 01/30/2016 Clear   Final  . Glucose, UA 01/30/2016 Negative   Final  . Bilirubin, UA 01/30/2016 1+   Final  . Ketones, UA 01/30/2016 Negative   Final  . Spec Grav, UA 01/30/2016 >=1.030   Final  . Blood, UA 01/30/2016 Negative   Final  . pH, UA 01/30/2016 5.5   Final  . Protein, UA 01/30/2016 1+   Final  . Urobilinogen, UA 01/30/2016 1.0   Final  . Nitrite, UA 01/30/2016 Negative   Final  . Leukocytes, UA 01/30/2016 Negative  Negative Final  Lab on 01/26/2016  Component Date Value Ref Range Status  . WBC 01/26/2016 4.5  3.8 - 10.8 K/uL Final  . RBC 01/26/2016 4.57  3.80 - 5.10 MIL/uL Final  . Hemoglobin 01/26/2016 12.4  11.7 - 15.5 g/dL Final  . HCT  01/26/2016 38.1  35.0 - 45.0 % Final  . MCV 01/26/2016 83.4  80.0 - 100.0 fL  Final  . MCH 01/26/2016 27.1  27.0 - 33.0 pg Final  . MCHC 01/26/2016 32.5  32.0 - 36.0 g/dL Final  . RDW 01/26/2016 15.0  11.0 - 15.0 % Final  . Platelets 01/26/2016 209  140 - 400 K/uL Final  . MPV 01/26/2016 11.3  7.5 - 12.5 fL Final  . Neutro Abs 01/26/2016 2070  1,500 - 7,800 cells/uL Final  . Lymphs Abs 01/26/2016 2025  850 - 3,900 cells/uL Final  . Monocytes Absolute 01/26/2016 360  200 - 950 cells/uL Final  . Eosinophils Absolute 01/26/2016 45  15 - 500 cells/uL Final  . Basophils Absolute 01/26/2016 0  0 - 200 cells/uL Final  . Neutrophils Relative % 01/26/2016 46  % Final  . Lymphocytes Relative 01/26/2016 45  % Final  . Monocytes Relative 01/26/2016 8  % Final  . Eosinophils Relative 01/26/2016 1  % Final  . Basophils Relative 01/26/2016 0  % Final  . Smear Review 01/26/2016 Criteria for review not met   Final  . Sodium 01/26/2016 140  135 - 146 mmol/L Final  . Potassium 01/26/2016 4.1  3.5 - 5.3 mmol/L Final  . Chloride 01/26/2016 105  98 - 110 mmol/L Final  . CO2 01/26/2016 22  20 - 31 mmol/L Final  . Glucose, Bld 01/26/2016 139* 65 - 99 mg/dL Final  . BUN 01/26/2016 17  7 - 25 mg/dL Final  . Creat 01/26/2016 1.43* 0.50 - 0.99 mg/dL Final   Comment:   For patients > or = 64 years of age: The upper reference limit for Creatinine is approximately 13% higher for people identified as African-American.     . Total Bilirubin 01/26/2016 0.5  0.2 - 1.2 mg/dL Final  . Alkaline Phosphatase 01/26/2016 50  33 - 130 U/L Final  . AST 01/26/2016 26  10 - 35 U/L Final  . ALT 01/26/2016 14  6 - 29 U/L Final  . Total Protein 01/26/2016 7.0  6.1 - 8.1 g/dL Final  . Albumin 01/26/2016 4.1  3.6 - 5.1 g/dL Final  . Calcium 01/26/2016 9.4  8.6 - 10.4 mg/dL Final  . GFR, Est African American 01/26/2016 45* >=60 mL/min Final  . GFR, Est Non African American 01/26/2016 39* >=60 mL/min Final  . Color, Urine  01/26/2016 DARK YELLOW  YELLOW Final  . APPearance 01/26/2016 CLEAR  CLEAR Final  . Specific Gravity, Urine 01/26/2016 1.027  1.001 - 1.035 Final  . pH 01/26/2016 5.5  5.0 - 8.0 Final  . Glucose, UA 01/26/2016 NEGATIVE  NEGATIVE Final  . Bilirubin Urine 01/26/2016 NEGATIVE  NEGATIVE Final  . Ketones, ur 01/26/2016 TRACE* NEGATIVE Final  . Hgb urine dipstick 01/26/2016 NEGATIVE  NEGATIVE Final  . Protein, ur 01/26/2016 1+* NEGATIVE Final  . Nitrite 01/26/2016 NEGATIVE  NEGATIVE Final  . Leukocytes, UA 01/26/2016 2+* NEGATIVE Final  . WBC, UA 01/26/2016 20-40* <=5 WBC/HPF Final  . RBC / HPF 01/26/2016 0-2  <=2 RBC/HPF Final  . Squamous Epithelial / LPF 01/26/2016 0-5  <=5 HPF Final  . Bacteria, UA 01/26/2016 MODERATE* NONE SEEN HPF Final  . Crystals 01/26/2016 See Below* NONE SEEN HPF Final   Comment: Calcium Oxalate Crystals             MANY      ABN   NONE SEEN     HPF Amorphous Sediment                   MODERATE  ABN   NONE SEEN  HPF   . Casts 01/26/2016 NONE SEEN  NONE SEEN LPF Final  . Yeast 01/26/2016 NONE SEEN  NONE SEEN HPF Final  Clinical Support on 11/30/2015  Component Date Value Ref Range Status  . HIV 1&2 Ab, 4th Generation 11/30/2015 NONREACTIVE  NONREACTIVE Final   Comment:   HIV-1 antigen and HIV-1/HIV-2 antibodies were not detected.  There is no laboratory evidence of HIV infection.   HIV-1/2 Antibody Diff        Not indicated. HIV-1 RNA, Qual TMA          Not indicated.     PLEASE NOTE: This information has been disclosed to you from records whose confidentiality may be protected by state law. If your state requires such protection, then the state law prohibits you from making any further disclosure of the information without the specific written consent of the person to whom it pertains, or as otherwise permitted by law. A general authorization for the release of medical or other information is NOT sufficient for this purpose.   The performance of this  assay has not been clinically validated in patients less than 65 years old.   For additional information please refer to http://education.questdiagnostics.com/faq/FAQ106.  (This link is being provided for informational/educational purposes only.)     Office Visit on 11/28/2015  Component Date Value Ref Range Status  . Cholesterol 11/28/2015 222* 0 - 200 mg/dL Final  . Triglycerides 11/28/2015 74.0  0.0 - 149.0 mg/dL Final  . HDL 11/28/2015 91.40  >39.00 mg/dL Final  . VLDL 11/28/2015 14.8  0.0 - 40.0 mg/dL Final  . LDL Cholesterol 11/28/2015 116* 0 - 99 mg/dL Final  . Total CHOL/HDL Ratio 11/28/2015 2   Final  . NonHDL 11/28/2015 130.30   Final  . Sodium 11/28/2015 141  135 - 145 mEq/L Final  . Potassium 11/28/2015 4.5  3.5 - 5.1 mEq/L Final  . Chloride 11/28/2015 105  96 - 112 mEq/L Final  . CO2 11/28/2015 32  19 - 32 mEq/L Final  . Glucose, Bld 11/28/2015 94  70 - 99 mg/dL Final  . BUN 11/28/2015 15  6 - 23 mg/dL Final  . Creatinine, Ser 11/28/2015 1.13  0.40 - 1.20 mg/dL Final  . Calcium 11/28/2015 9.7  8.4 - 10.5 mg/dL Final  . GFR 11/28/2015 62.47  >60.00 mL/min Final     Imaging: No results found.  Speciality Comments: No specialty comments available.    Procedures:  No procedures performed Allergies: Cymbalta [duloxetine hcl]; Statins; and Sulfa antibiotics   Assessment / Plan:     Visit Diagnoses: Sjogren's syndrome, with unspecified organ involvement ([place])  DDD (degenerative disc disease), cervical  High risk medications (not anticoagulants) long-term use - Plan: CBC with Differential/Platelet, COMPLETE METABOLIC PANEL WITH GFR  Osteopenia of other site  Fibromyalgia  Chronic fatigue  Other insomnia   Plan: #1: Patient is doing well with Sjogren's. Using over-the-counter products with good relief. Using the pilocarpine with good relief. I encouraged her to consider using xylimelt patches at night if she needs to get use them.  #2: Continue  Plaquenil 200 mg twice a day Monday through Friday Plaquenil eye exam is normal as of 08/15/2015. She'll be due for a repeat eye exam for Plaquenil May 2018.  #3: Fibromyalgia. Patient has 16 out of 18 tender points only thing that's not tender is the bilateral trapezius muscles. Every other tender points are positive. Patient rates her discomfort as a 10 on a scale of 0-10. She goes to  her son's house 2 times a week where they have a swelling: And she is able to do water aerobics twice a week all through the year. I believe this is helping her significantly but unfortunately she still having a lot of pain from her fibromyalgia.  #4: Patient has fatigue which she rates as 10 on a scale of 0-10.  #5: She was benefiting from Robaxin but her insurance company has declined paying for the medication. We went over other ways of getting the Robaxin and patient knows that [email or web address] is a good option for her. 180 pills of Robaxin 500 mg to be used twice a day as needed cost less than $20 and patient is happy to go to [facility] and get those filled through [email or web address]/ and [email or web address] and my prescription. She will use them sparingly when needed.  #6: Patient is having a fair amount of neuropathy and her family doctor has increased her Neurontin to 2 pills in the morning 1 midday and 2 pills in the evening. This has been recently done. She thinks that it's helping her but it's too early to make a judgment on that. She follows up with her PCP in March and will discuss further with her PCP.  #7: Based on the above, she may benefit from adding Robaxin on board and if that helps her she can avoid increasing her gabapentin even further.  #8: Patient is due for repeat labs today. The last labs were done November 2017 and it looked as if patient had a urinary tract infection. She followed up with her PCP who says she did not have a UTI and she did not need antibiotic. She was advised to drink more water which she  did and her labs came back normal thereafter.  #9: Today she will do CBC with differential CMP with GFR.  #10: Return to clinic in 5 months  Orders: Orders Placed This Encounter  Procedures  . CBC with Differential/Platelet  . COMPLETE METABOLIC PANEL WITH GFR   Meds ordered this encounter  Medications  . methocarbamol (ROBAXIN) 500 MG tablet    Sig: Take 1 tablet (500 mg total) by mouth 2 (two) times daily as needed for muscle spasms.    Dispense:  180 tablet    Refill:  1    Order Specific Question:   Supervising Provider    Answer:   Bo Merino [phone]    Face-to-face time spent with patient was 30 minutes. 50% of time was spent in counseling and coordination of care.  Follow-Up Instructions: Return in about 5 months (around 09/19/2016) for sjogr,ddd-c-Spine;Openia;fms,fatigue,insomnia.   Eliezer Lofts, PA-C  Note - This record has been created using [employer].  Chart creation errors have been sought, but may not always  have been located. Such creation errors do not reflect on  the standard of medical care.

## 2016-05-05 ENCOUNTER — Ambulatory Visit (HOSPITAL_COMMUNITY)
Admission: EM | Admit: 2016-05-05 | Discharge: 2016-05-05 | Disposition: A | Discharge status: Home / Self Care | Payer: Medicare Other | Attending: Family Medicine | Admitting: Family Medicine

## 2016-05-05 ENCOUNTER — Ambulatory Visit (INDEPENDENT_AMBULATORY_CARE_PROVIDER_SITE_OTHER): Payer: Medicare Other

## 2016-05-05 DIAGNOSIS — M79652 Pain in left thigh: Secondary | ICD-10-CM

## 2016-05-05 DIAGNOSIS — M1612 Unilateral primary osteoarthritis, left hip: Secondary | ICD-10-CM

## 2016-05-05 DIAGNOSIS — M545 Low back pain, unspecified: Secondary | ICD-10-CM

## 2016-05-05 DIAGNOSIS — M25552 Pain in left hip: Secondary | ICD-10-CM

## 2016-05-05 DIAGNOSIS — M79605 Pain in left leg: Secondary | ICD-10-CM

## 2016-05-05 NOTE — ED Triage Notes (Signed)
C/o left hip pain for a month now States pain is getting worst  States pain has her walking different No specialist seen

## 2016-05-05 NOTE — ED Provider Notes (Signed)
CSN: [id number]     Arrival date & time 05/05/16  1355 History   First MD Initiated Contact with Patient 05/05/16 1503     Chief Complaint  Patient presents with  . Hip Pain   (Consider location/radiation/quality/duration/timing/severity/associated sxs/prior Treatment) HPI  Alexandria Leach is a 66 y.o. female presenting to UC with c/o 1 month of gradually worsening Left sided hip pain that radiates from Left lateral thigh to her hip and Left lower back.  Pain is aching and sore, 8/10, worse with ambulation and palpation.  She was seen by her rheumatologist about the pain but notes he wants to give her a cortisone injection. Pt states those do not help her pain.  She has not taken acetaminophen or ibuprofen for pain due to hx of CKD. She is not on dialysis.  She has been prescribed tramadol by her rheumatologist for pain in the past.  No known recent injuries. She has been using a walker to help her ambulate due to worsening pain.  She was prescribed robaxin in the past but notes it drops her BP too low.    Past Medical History:  Diagnosis Date  . Ankle fracture, left 02/23/2015  . Ankle fracture, left 02/19/2015   from a fall  . Anxiety   . CHF (congestive heart failure) ([place])   . CKD (chronic kidney disease) stage 3, GFR 30-59 ml/min    saw nephrologist Dr Harden Mo  . DDD (degenerative disc disease), cervical   . Depression   . Fibromyalgia   . Glaucoma    s/p surgery, sees ophtho Q6 mo  . 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 History of kidney stones 2010  . History of pulmonary embolism 2001, 2006   completed coumadin courses  . History of rheumatic fever x3  . HLD (hyperlipidemia)   . HTN (hypertension)   . Insomnia   . Lung nodule 09/22/2012   RLL - 34mm, stable since 2014. Thought benign.   . Osteoarthritis    shoulders and knees, not RA per Dr Estanislado Pandy, positive ANA, positive Ro  . Osteopenia 09/18/2015   DEXA T -1.1 hip, -0.2 spine 08/2015   . Personal history of urinary calculi latest 2014  . Pneumonia 12/02/2011  . PONV (postoperative nausea and vomiting)   . Refusal of blood transfusions as patient is Jehovah's Witness   . Rheumatic heart disease 1980   s/p mitral valve repair 1980  . Sjogren's syndrome [place])    Past Surgical History:  Procedure Laterality Date  . BREAST BIOPSY Right 2006   benign  . CHOLECYSTECTOMY  11/27/2011   Procedure: LAPAROSCOPIC CHOLECYSTECTOMY WITH INTRAOPERATIVE CHOLANGIOGRAM;  Surgeon: Adin Hector, MD;  Location: [place] [facility];  Service: General;  Laterality: N/A;  laparoscopic cholecystectomy with choleangiogram umbilical hernia repair  . COLONOSCOPY  07/2014   WNL Amedeo Plenty)  . dexa  08/2012   normal per patient - no records available  . MITRAL VALVE REPAIR  1980   open heart  . ORIF ANKLE FRACTURE Left 02/26/2015   Procedure: OPEN REDUCTION INTERNAL FIXATION (ORIF) LEFT TRIMALLEOLAR ANKLE FRACTURE;  Surgeon: [name], MD;  Location: [facility];  Service: Orthopedics;  Laterality: Left;  . TUBAL LIGATION  1980  . UMBILICAL HERNIA REPAIR  11/27/2011   Procedure: HERNIA REPAIR UMBILICAL ADULT;  Surgeon: Adin Hector, MD;  Location: [facility];  Service: General;  Laterality: N/A;  . [place]  for fibroids -- partial, ovaries remain   Family History  Problem Relation Age of Onset  . Lupus Sister     and niece  . Cancer Mother     lung (nonsmoker)  . CAD Mother     MI in her 75s  . ALS Mother   . Kidney disease Father   . Alcohol abuse Father   . Diabetes Father   . Cancer Brother     bone  . Diabetes Sister   . Stroke Sister   . Cancer Maternal Uncle     bone  . Depression Sister   . Kidney failure Other     on HD  . Diabetes Brother   . Heart attack Brother   . Stroke Maternal Grandmother    Social History  Substance Use Topics  . Smoking status: Never Smoker  . Smokeless tobacco: Never Used  . Alcohol use No   OB History     No data available     Review of Systems  Musculoskeletal: Positive for arthralgias, back pain, gait problem and myalgias. Negative for joint swelling, neck pain and neck stiffness.  Skin: Negative for color change and wound.  Neurological: Negative for weakness and numbness.    Allergies  Cymbalta [duloxetine hcl]; Statins; and Sulfa antibiotics  Home Medications   Prior to Admission medications   Medication Sig Start Date End Date Taking? Authorizing Provider  ALPRAZolam Duanne Moron) 1 MG tablet Take 1 tablet (1 mg total) by mouth 3 (three) times daily as needed for anxiety. 02/23/16 02/22/17 Yes Cloria Spring, MD  amLODipine (NORVASC) 10 MG tablet Take 1 tablet (10 mg total) by mouth daily. 12/27/15  Yes Ria Bush, MD  antiseptic oral rinse (BIOTENE) LIQD 15 mLs by Mouth Rinse route at bedtime.    Yes Historical Provider, MD  aspirin 325 MG EC tablet Take 325 mg by mouth daily.    Yes Historical Provider, MD  cholecalciferol (VITAMIN D) 1000 UNITS tablet Take 1,000 Units by mouth daily.    Yes Historical Provider, MD  diclofenac sodium (VOLTAREN) 1 % GEL Apply 1 application topically 2 (two) times daily as needed (pain).   Yes Historical Provider, MD  escitalopram (LEXAPRO) 20 MG tablet Take 1 tablet (20 mg total) by mouth 2 (two) times daily. 02/23/16 02/22/17 Yes Cloria Spring, MD  ezetimibe (ZETIA) 10 MG tablet Take 1 tablet (10 mg total) by mouth at bedtime. 09/18/15  Yes Ria Bush, MD  famotidine (PEPCID) 20 MG tablet TAKE 1 TABLET BY MOUTH TWO  TIMES DAILY 03/22/16  Yes Ria Bush, MD  furosemide (LASIX) 20 MG tablet TAKE 1 TABLET BY MOUTH  DAILY AS NEEDED FOR EDEMA 03/20/16  Yes Ria Bush, MD  gabapentin (NEURONTIN) 300 MG capsule Take 1 capsule (300 mg total) by mouth as directed. Take 2 pills in am and at bedtime, one in the afternoon 02/13/16  Yes Ria Bush, MD  hydrochlorothiazide (MICROZIDE) 12.5 MG capsule TAKE 1 CAPSULE BY MOUTH  DAILY 02/14/16  Yes  Ria Bush, MD  hydroxychloroquine (PLAQUENIL) 200 MG tablet Take 200 mg by mouth daily.  11/05/14  Yes Ria Bush, MD  lovastatin (MEVACOR) 10 MG tablet Take 1 tablet (10 mg total) by mouth every Monday, Wednesday, and Friday. 12/08/15  Yes Ria Bush, MD  methocarbamol (ROBAXIN) 500 MG tablet Take 1 tablet (500 mg total) by mouth 2 (two) times daily as needed for muscle spasms. 04/22/16 10/19/16 Yes Naitik Panwala, PA-C  pilocarpine (SALAGEN) 5 MG tablet  Take 2.5-5 mg by mouth 2 (two) times daily as needed. Take 1 tablet (5 mg) by mouth every morning and 1/2 tablet (2.5 mg) at 2-2:30pm if needed for Sjogren syndrome   Yes Historical Provider, MD  Polyethyl Glycol-Propyl Glycol (SYSTANE) 0.4-0.3 % GEL Place 1 drop into both eyes 2 (two) times daily.    Yes Historical Provider, MD  RESTASIS 0.05 % ophthalmic emulsion INSTILL ONE DROP IN BOTH  EYES TWO TIMES DAILY 03/20/16  Yes Ria Bush, MD  traMADol (ULTRAM) 50 MG tablet Take 1 tablet (50 mg total) by mouth every 12 (twelve) hours as needed for severe pain. 09/08/14  Yes Ria Bush, MD  zolpidem (AMBIEN) 10 MG tablet Take 1 tablet (10 mg total) by mouth at bedtime as needed for sleep. 04/17/16 05/17/16 Yes Cloria Spring, MD  metoprolol succinate (TOPROL-XL) 50 MG 24 hr tablet Take 1 tablet (50 mg total) by mouth 2 (two) times daily. Take with or immediately following a meal. 11/16/15 02/14/16  Lelon Perla, MD   Meds Ordered and Administered this Visit  Medications - No data to display  BP 128/67 (BP Location: Left Arm)   Pulse 65   Temp 98.1 F (36.7 C) (Oral)   Resp 16   SpO2 100%  No data found.   Physical Exam  Constitutional: She is oriented to person, place, and time. She appears well-developed and well-nourished.  HENT:  Head: Normocephalic and atraumatic.  Eyes: EOM are normal.  Neck: Normal range of motion.  Cardiovascular: Normal rate.   Pulmonary/Chest: Effort normal.  Musculoskeletal: Normal range of  motion. She exhibits tenderness. She exhibits no edema.  No midline spinal tenderness. Tenderness to Left lower lumbar muscles, Left hip and Left lateral thigh. Antalgic gait, uses walker for assistance.   Neurological: She is alert and oriented to person, place, and time.  Skin: Skin is warm and dry.  Psychiatric: She has a normal mood and affect. Her behavior is normal.  Nursing note and vitals reviewed.   Urgent Care Course     Procedures (including critical care time)  Labs Review Labs Reviewed - No data to display  Imaging Review Dg Hip Unilat W Or Wo Pelvis 2-3 Views Left  Result Date: 05/05/2016 CLINICAL DATA:  Pain EXAM: DG HIP  2-3V LEFT COMPARISON:  None. FINDINGS: Standing frontal and standing lateral left hip images obtained. There is no fracture or dislocation. Slight narrowing left hip joint. No erosive change. IMPRESSION: Slight narrowing left hip joint.  No fracture or dislocation. Electronically Signed   By: Lowella Grip III M.D.   On: 05/05/2016 15:41     MDM   1. Left hip pain   2. Left thigh pain   3. Low back pain radiating to left lower extremity   4. Osteoarthritis of left hip, unspecified osteoarthritis type    Hx and exam c/w exacerbation of chronic Left lower back and hip pain, secondary to arthritis w/o evidence fracture or dislocation.  Pt notes she has tramadol at home. Encouraged to take as prescribed. May alternate cool and warm compresses. F/u with PCP or orthopedist for further evaluation and treatment.     Noland Fordyce, PA-C 05/05/16 1555

## 2016-05-07 ENCOUNTER — Encounter (INDEPENDENT_AMBULATORY_CARE_PROVIDER_SITE_OTHER): Payer: Self-pay | Admitting: Orthopaedic Surgery

## 2016-05-07 ENCOUNTER — Ambulatory Visit (INDEPENDENT_AMBULATORY_CARE_PROVIDER_SITE_OTHER): Payer: Medicare Other | Admitting: Orthopaedic Surgery

## 2016-05-07 ENCOUNTER — Ambulatory Visit (INDEPENDENT_AMBULATORY_CARE_PROVIDER_SITE_OTHER): Payer: Medicare Other

## 2016-05-07 DIAGNOSIS — M1612 Unilateral primary osteoarthritis, left hip: Secondary | ICD-10-CM

## 2016-05-07 MED ORDER — METHYLPREDNISOLONE ACETATE 40 MG/ML IJ SUSP
40.0000 mg | INTRAMUSCULAR | Status: AC | PRN
  Administered 2016-05-07: 40 mg via INTRA_ARTICULAR

## 2016-05-07 MED ORDER — LIDOCAINE HCL 1 % IJ SOLN
3.0000 mL | INTRAMUSCULAR | Status: AC | PRN
  Administered 2016-05-07: 3 mL

## 2016-05-07 MED ORDER — BUPIVACAINE HCL 0.5 % IJ SOLN
3.0000 mL | INTRAMUSCULAR | Status: AC | PRN
  Administered 2016-05-07: 3 mL via INTRA_ARTICULAR

## 2016-05-07 NOTE — Progress Notes (Signed)
Office Visit Note   Patient: Alexandria Leach           Date of Birth: 02/27/1951           MRN: [id number] Visit Date: 05/07/2016              Requested by: Ria Bush, MD [address] [place], [place] [address] PCP: Ria Bush, MD   Assessment & Plan: Visit Diagnoses:  1. Primary osteoarthritis of left hip     Plan: PT for stretching and modalities.  Troch injection given today.  F/u prn.  Follow-Up Instructions: No Follow-up on file.   Orders:  Orders Placed This Encounter  Procedures  . XR HIP UNILAT W OR W/O PELVIS 2-3 VIEWS LEFT   No orders of the defined types were placed in this encounter.     Procedures: Large Joint Inj Date/Time: 05/07/2016 10:57 AM Performed by: [name] Authorized by: [name]   Consent Given by:  Patient Timeout: prior to procedure the correct patient, procedure, and site was verified   Indications:  Pain Location:  Hip Site:  L greater trochanter Prep: patient was prepped and draped in usual sterile fashion   Needle Size:  22 G Approach:  Lateral Ultrasound Guidance: No   Fluoroscopic Guidance: No   Arthrogram: No   Medications:  3 mL lidocaine 1 %; 3 mL bupivacaine 0.5 %; 40 mg methylPREDNISolone acetate 40 MG/ML     Clinical Data: No additional findings.   Subjective: Chief Complaint  Patient presents with  . Left Hip - Pain    Patient comes in today with new problem of left hip pain worse with walking. Using a cane.  Walks with limp.  Radiates up to lower back.  Denies groin or radicular pain.      Review of Systems  Constitutional: Negative.   HENT: Negative.   Eyes: Negative.   Respiratory: Negative.   Cardiovascular: Negative.   Endocrine: Negative.   Musculoskeletal: Negative.   Neurological: Negative.   Hematological: Negative.   Psychiatric/Behavioral: Negative.   All other systems reviewed and are negative.    Objective: Vital Signs: There were no vitals taken for  this visit.  Physical Exam  Constitutional: She is oriented to person, place, and time. She appears well-developed and well-nourished.  Pulmonary/Chest: Effort normal.  Neurological: She is alert and oriented to person, place, and time.  Skin: Skin is warm. Capillary refill takes less than 2 seconds.  Psychiatric: She has a normal mood and affect. Her behavior is normal. Judgment and thought content normal.  Nursing note and vitals reviewed.   Ortho Exam Left hip - ttp IT band - no groin pain, neg stinchfield - neg SLR Specialty Comments:  No specialty comments available.  Imaging: No results found.   PMFS History: Patient Active Problem List   Diagnosis Date Noted  . Neuropathy ([place]) 01/30/2016  . DNR no code (do not resuscitate) 12/08/2015  . 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 TIA (transient ischemic attack) 10/12/2015  . Abnormal urinalysis 10/12/2015  . Mild mitral regurgitation 10/12/2015  . Osteopenia 09/18/2015  . Right arm pain 08/29/2015  . Fall   . Trimalleolar fracture of left ankle 02/23/2015  . Right sided sciatica 02/21/2015  . Imbalance 01/12/2015  . Transaminitis 12/24/2014  . DDD (degenerative disc disease), cervical   . Health maintenance examination 10/18/2014  . Advanced care planning/counseling discussion 10/18/2014  . Medicare annual wellness visit, initial 10/18/2014  .  Refusal of blood transfusions as patient is Jehovah's Witness   . Sjogren's syndrome ([place])   . CKD (chronic kidney disease) stage 3, GFR 30-59 ml/min   . Glaucoma   . Fibromyalgia   . Osteoarthritis   . History of pulmonary embolism   . Lung nodule 09/22/2012  . Diastolic CHF ([place]) [id number]  . Aortic stenosis 04/22/2011  . Palpitations 08/10/2010  . Dyspnea 08/10/2010  . HOT FLASHES 06/28/2008  . Trochanteric bursitis of right hip 06/28/2008  . ARTHRALGIA 07/01/2007  . BACK PAIN, LEFT 08/25/2006  . GAD (generalized anxiety disorder) 03/28/2006   . MITRAL VALVE REPLACEMENT, HX OF 03/28/2006  . TAH/BSO, HX OF 03/28/2006  . HYPERCHOLESTEROLEMIA 12/31/2005  . MDD (major depressive disorder), recurrent episode ([place]) 12/31/2005  . RHEUMATIC HEART DISEASE 12/31/2005  . Essential hypertension 12/31/2005  . Chronic insomnia 12/31/2005   Past Medical History:  Diagnosis Date  . Ankle fracture, left 02/23/2015  . Ankle fracture, left 02/19/2015   from a fall  . Anxiety   . CHF (congestive heart failure) ([place])   . CKD (chronic kidney disease) stage 3, GFR 30-59 ml/min    saw nephrologist Dr Harden Mo  . DDD (degenerative disc disease), cervical   . Depression   . Fibromyalgia   . Glaucoma    s/p surgery, sees ophtho Q6 mo  . 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 History of kidney stones 2010  . History of pulmonary embolism 2001, 2006   completed coumadin courses  . History of rheumatic fever x3  . HLD (hyperlipidemia)   . HTN (hypertension)   . Insomnia   . Lung nodule 09/22/2012   RLL - 31mm, stable since 2014. Thought benign.   . Osteoarthritis    shoulders and knees, not RA per Dr Estanislado Pandy, positive ANA, positive Ro  . Osteopenia 09/18/2015   DEXA T -1.1 hip, -0.2 spine 08/2015   . Personal history of urinary calculi latest 2014  . Pneumonia 12/02/2011  . PONV (postoperative nausea and vomiting)   . Refusal of blood transfusions as patient is Jehovah's Witness   . Rheumatic heart disease 1980   s/p mitral valve repair 1980  . Sjogren's syndrome ([place])     Family History  Problem Relation Age of Onset  . Lupus Sister     and niece  . Cancer Mother     lung (nonsmoker)  . CAD Mother     MI in her 53s  . ALS Mother   . Kidney disease Father   . Alcohol abuse Father   . Diabetes Father   . Cancer Brother     bone  . Diabetes Sister   . Stroke Sister   . Cancer Maternal Uncle     bone  . Depression Sister   . Kidney failure Other     on HD  .  Diabetes Brother   . Heart attack Brother   . Stroke Maternal Grandmother     Past Surgical History:  Procedure Laterality Date  . BREAST BIOPSY Right 2006   benign  . CHOLECYSTECTOMY  11/27/2011   Procedure: LAPAROSCOPIC CHOLECYSTECTOMY WITH INTRAOPERATIVE CHOLANGIOGRAM;  Surgeon: Adin Hector, MD;  Location: [facility];  Service: General;  Laterality: N/A;  laparoscopic cholecystectomy with choleangiogram umbilical hernia repair  . COLONOSCOPY  07/2014   WNL Amedeo Plenty)  . dexa  08/2012   normal per patient - no records available  . MITRAL VALVE REPAIR  1980   open heart  . ORIF ANKLE FRACTURE Left 02/26/2015   Procedure: OPEN REDUCTION INTERNAL FIXATION (ORIF) LEFT TRIMALLEOLAR ANKLE FRACTURE;  Surgeon: [name], MD;  Location: [facility];  Service: Orthopedics;  Laterality: Left;  . TUBAL LIGATION  1980  . UMBILICAL HERNIA REPAIR  11/27/2011   Procedure: HERNIA REPAIR UMBILICAL ADULT;  Surgeon: Adin Hector, MD;  Location: [facility];  Service: General;  Laterality: N/A;  . [place]   for fibroids -- partial, ovaries remain   Social History   Occupational History  .  Unemployed    Disability   Social History Main Topics  . Smoking status: Never Smoker  . Smokeless tobacco: Never Used  . Alcohol use No  . Drug use: No  . Sexual activity: Not Currently    Birth control/ protection: Surgical

## 2016-05-08 NOTE — Progress Notes (Signed)
HPI: FU mitral valve repair secondary to rheumatic heart disease. Patient is status post mitral valve repair in 1980. Operative report not available. Nuclear study 11/14 showed EF 66 and normal perfusion. Holter 11/14 showed sinus with pacs, pvcs and brief PAT. Last echo 7/17 showed normal LV systolic function, grade 2 diastolic dysfunction, mild AS/AI, s/p MV repair, mild MS (mean gradient 6 mmHg); mild to moderate MR, moderate LAE. Since last seen, the patient has dyspnea with more extreme activities but not with routine activities. It is relieved with rest. It is not associated with chest pain. There is no orthopnea, PND or pedal edema. There is no syncope or palpitations. There is no exertional chest pain.   Current Outpatient Prescriptions  Medication Sig Dispense Refill  . ALPRAZolam (XANAX) 1 MG tablet Take 1 tablet (1 mg total) by mouth 3 (three) times daily as needed for anxiety. 270 tablet 1  . amLODipine (NORVASC) 10 MG tablet Take 1 tablet (10 mg total) by mouth daily. 90 tablet 3  . antiseptic oral rinse (BIOTENE) LIQD 15 mLs by Mouth Rinse route at bedtime.     Marland Kitchen aspirin 325 MG EC tablet Take 325 mg by mouth daily.     . cholecalciferol (VITAMIN D) 1000 UNITS tablet Take 1,000 Units by mouth daily.     . diclofenac sodium (VOLTAREN) 1 % GEL Apply 1 application topically 2 (two) times daily as needed (pain).    Marland Kitchen escitalopram (LEXAPRO) 20 MG tablet Take 1 tablet (20 mg total) by mouth 2 (two) times daily. 180 tablet 2  . ezetimibe (ZETIA) 10 MG tablet Take 1 tablet (10 mg total) by mouth at bedtime. 90 tablet 3  . famotidine (PEPCID) 20 MG tablet TAKE 1 TABLET BY MOUTH TWO  TIMES DAILY 180 tablet 3  . furosemide (LASIX) 20 MG tablet TAKE 1 TABLET BY MOUTH  DAILY AS NEEDED FOR EDEMA 90 tablet 3  . gabapentin (NEURONTIN) 300 MG capsule Take 1 capsule (300 mg total) by mouth as directed. Take 2 pills in am and at bedtime, one in the afternoon 470 capsule 1  . hydrochlorothiazide  (MICROZIDE) 12.5 MG capsule TAKE 1 CAPSULE BY MOUTH  DAILY 90 capsule 2  . hydroxychloroquine (PLAQUENIL) 200 MG tablet Take 200 mg by mouth daily.     Marland Kitchen lovastatin (MEVACOR) 10 MG tablet Take 1 tablet (10 mg total) by mouth every Monday, Wednesday, and Friday.    . metoprolol succinate (TOPROL-XL) 50 MG 24 hr tablet Take 1 tablet by mouth 2 (two) times daily.    . pilocarpine (SALAGEN) 5 MG tablet Take 2.5-5 mg by mouth 2 (two) times daily as needed. Take 1 tablet (5 mg) by mouth every morning and 1/2 tablet (2.5 mg) at 2-2:30pm if needed for Sjogren syndrome    . Polyethyl Glycol-Propyl Glycol (SYSTANE) 0.4-0.3 % GEL Place 1 drop into both eyes 2 (two) times daily.     . RESTASIS 0.05 % ophthalmic emulsion INSTILL ONE DROP IN BOTH  EYES TWO TIMES DAILY 90 each 3  . traMADol (ULTRAM) 50 MG tablet Take 1 tablet (50 mg total) by mouth every 12 (twelve) hours as needed for severe pain. 30 tablet 3  . zolpidem (AMBIEN) 10 MG tablet Take 1 tablet (10 mg total) by mouth at bedtime as needed for sleep. 30 tablet 0   No current facility-administered medications for this visit.      Past Medical History:  Diagnosis Date  . Ankle fracture, left  02/23/2015  . Ankle fracture, left 02/19/2015   from a fall  . Anxiety   . CHF (congestive heart failure) ([place])   . CKD (chronic kidney disease) stage 3, GFR 30-59 ml/min    saw nephrologist Dr Harden Mo  . DDD (degenerative disc disease), cervical   . Depression   . Fibromyalgia   . Glaucoma    s/p surgery, sees ophtho Q6 mo  . 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 History of kidney stones 2010  . History of pulmonary embolism 2001, 2006   completed coumadin courses  . History of rheumatic fever x3  . HLD (hyperlipidemia)   . HTN (hypertension)   . Insomnia   . Lung nodule 09/22/2012   RLL - 75mm, stable since 2014. Thought benign.   . Osteoarthritis    shoulders and knees, not RA  per Dr Estanislado Pandy, positive ANA, positive Ro  . Osteopenia 09/18/2015   DEXA T -1.1 hip, -0.2 spine 08/2015   . Personal history of urinary calculi latest 2014  . Pneumonia 12/02/2011  . PONV (postoperative nausea and vomiting)   . Refusal of blood transfusions as patient is Jehovah's Witness   . Rheumatic heart disease 1980   s/p mitral valve repair 1980  . Sjogren's syndrome [facility])     Past Surgical History:  Procedure Laterality Date  . BREAST BIOPSY Right 2006   benign  . CHOLECYSTECTOMY  11/27/2011   Procedure: LAPAROSCOPIC CHOLECYSTECTOMY WITH INTRAOPERATIVE CHOLANGIOGRAM;  Surgeon: Adin Hector, MD;  Location: [facility];  Service: General;  Laterality: N/A;  laparoscopic cholecystectomy with choleangiogram umbilical hernia repair  . COLONOSCOPY  07/2014   WNL Amedeo Plenty)  . dexa  08/2012   normal per patient - no records available  . MITRAL VALVE REPAIR  1980   open heart  . ORIF ANKLE FRACTURE Left 02/26/2015   Procedure: OPEN REDUCTION INTERNAL FIXATION (ORIF) LEFT TRIMALLEOLAR ANKLE FRACTURE;  Surgeon: [name], MD;  Location: [facility];  Service: Orthopedics;  Laterality: Left;  . TUBAL LIGATION  1980  . UMBILICAL HERNIA REPAIR  11/27/2011   Procedure: HERNIA REPAIR UMBILICAL ADULT;  Surgeon: Adin Hector, MD;  Location: [facility];  Service: General;  Laterality: N/A;  . [place]   for fibroids -- partial, ovaries remain    Social History   Social History  . Marital status: Married    Spouse name: [name]  . Number of children: 4  . Years of education: 12   Occupational History  .  Unemployed    Disability   Social History Main Topics  . Smoking status: Never Smoker  . Smokeless tobacco: Never Used  . Alcohol use No  . Drug use: No  . Sexual activity: Not Currently    Birth control/ protection: Surgical   Other Topics Concern  . Not on file   Social History Narrative   Lives with son, 1 dog   Occupation: unemployed, on disability for  fibromyalgia since 2008.   Edu: HS   Religion: Jehova's witness   Activity: volunteers at senior center   Diet: some water, fruits/vegetables daily   No caffeine use    Family History  Problem Relation Age of Onset  . Lupus Sister     and niece  . Cancer Mother     lung (nonsmoker)  . CAD Mother     MI in her 71s  . ALS Mother   .  Kidney disease Father   . Alcohol abuse Father   . Diabetes Father   . Cancer Brother     bone  . Diabetes Sister   . Stroke Sister   . Cancer Maternal Uncle     bone  . Depression Sister   . Kidney failure Other     on HD  . Diabetes Brother   . Heart attack Brother   . Stroke Maternal Grandmother     ROS: Pain in shoulders, back and hips from fibromyalgia but no fevers or chills, productive cough, hemoptysis, dysphasia, odynophagia, melena, hematochezia, dysuria, hematuria, rash, seizure activity, orthopnea, PND, pedal edema, claudication. Remaining systems are negative.  Physical Exam: Well-developed well-nourished in no acute distress.  Skin is warm and dry.  HEENT is normal.  Neck is supple. No bruits Chest is clear to auscultation with normal expansion.  Cardiovascular exam is regular rate and rhythm. 2/6 systolic murmur LSB Abdominal exam nontender or distended. No masses palpated. Extremities show no edema. neuro grossly intact  ECG-Sinus rhythm at a rate of 70. Nonspecific ST changes.  A/P  1 Hyperlipidemia-continue statin; management per primary care.  2 hypertension-blood pressure controlled; continue present meds.  3 history of mitral valve repair-continue SBE prophylaxis. Plan fu echo for MS/MR and AS when she returns in one year.  4 palpitations-Symptoms contolled; continue beta blocker.  5 diastolic congestive heart failure chronic-continue present dose of diuretics. Euvolemic on examination. Laboratory stable at 2018 showed potassium 4.1 and creatinine 1.12.  Kirk Ruths, MD

## 2016-05-14 ENCOUNTER — Ambulatory Visit (INDEPENDENT_AMBULATORY_CARE_PROVIDER_SITE_OTHER): Payer: Medicare Other | Admitting: Cardiology

## 2016-05-14 ENCOUNTER — Encounter: Payer: Self-pay | Admitting: Cardiology

## 2016-05-14 ENCOUNTER — Ambulatory Visit: Payer: Self-pay | Admitting: Cardiology

## 2016-05-14 VITALS — BP 113/74 | HR 70 | Ht 61.0 in | Wt 166.0 lb

## 2016-05-14 DIAGNOSIS — I5032 Chronic diastolic (congestive) heart failure: Secondary | ICD-10-CM

## 2016-05-14 DIAGNOSIS — E7800 Pure hypercholesterolemia, unspecified: Secondary | ICD-10-CM | POA: Diagnosis not present

## 2016-05-14 DIAGNOSIS — I10 Essential (primary) hypertension: Secondary | ICD-10-CM

## 2016-05-14 DIAGNOSIS — Z9889 Other specified postprocedural states: Secondary | ICD-10-CM | POA: Diagnosis not present

## 2016-05-14 NOTE — Patient Instructions (Signed)
Your physician recommends that you continue on your current medications as directed. Please refer to the Current Medication list given to you today.   Your physician wants you to follow-up in: 1 year with Dr. Trellis Moment will receive a reminder letter in the mail two months in advance. If you don't receive a letter, please call our office to schedule the follow-up appointment.

## 2016-05-18 ENCOUNTER — Other Ambulatory Visit: Payer: Self-pay | Admitting: Rheumatology

## 2016-05-18 ENCOUNTER — Other Ambulatory Visit: Payer: Self-pay | Admitting: Family Medicine

## 2016-05-20 NOTE — Telephone Encounter (Signed)
Last Visit: 04/22/16 Next Visit: 09/19/16  Okay to refill Pilocarpine?

## 2016-05-23 ENCOUNTER — Ambulatory Visit (HOSPITAL_COMMUNITY): Payer: Self-pay | Admitting: Psychiatry

## 2016-05-28 ENCOUNTER — Ambulatory Visit (HOSPITAL_COMMUNITY): Payer: Self-pay | Admitting: Psychiatry

## 2016-05-29 ENCOUNTER — Ambulatory Visit (INDEPENDENT_AMBULATORY_CARE_PROVIDER_SITE_OTHER): Payer: Medicare Other | Admitting: Family Medicine

## 2016-05-29 ENCOUNTER — Encounter: Payer: Self-pay | Admitting: Family Medicine

## 2016-05-29 ENCOUNTER — Ambulatory Visit: Payer: Medicare Other | Admitting: Family Medicine

## 2016-05-29 VITALS — BP 116/70 | HR 64 | Ht 61.0 in | Wt 168.8 lb

## 2016-05-29 DIAGNOSIS — E7800 Pure hypercholesterolemia, unspecified: Secondary | ICD-10-CM | POA: Diagnosis not present

## 2016-05-29 DIAGNOSIS — N183 Chronic kidney disease, stage 3 unspecified: Secondary | ICD-10-CM

## 2016-05-29 DIAGNOSIS — I10 Essential (primary) hypertension: Secondary | ICD-10-CM | POA: Diagnosis not present

## 2016-05-29 DIAGNOSIS — M3500 Sicca syndrome, unspecified: Secondary | ICD-10-CM | POA: Diagnosis not present

## 2016-05-29 DIAGNOSIS — G6289 Other specified polyneuropathies: Secondary | ICD-10-CM | POA: Diagnosis not present

## 2016-05-29 LAB — LIPID PANEL
CHOL/HDL RATIO: 2
CHOLESTEROL: 208 mg/dL — AB (ref 0–200)
HDL: 88.4 mg/dL (ref >39.00)
LDL Cholesterol: 105 mg/dL — ABNORMAL HIGH (ref 0–99)
NonHDL: 119.55
Triglycerides: 72 mg/dL (ref 0.0–149.0)
VLDL: 14.4 mg/dL (ref 0.0–40.0)

## 2016-05-29 MED ORDER — EZETIMIBE 10 MG PO TABS: 10.0000 mg | ORAL_TABLET | Freq: Every day | ORAL | 3 refills | Status: DC

## 2016-05-29 MED ORDER — LOVASTATIN 10 MG PO TABS: 10.0000 mg | ORAL_TABLET | ORAL | 3 refills | Status: DC

## 2016-05-29 NOTE — Assessment & Plan Note (Signed)
Appreciate rheum care. Has established with new optho.

## 2016-05-29 NOTE — Assessment & Plan Note (Signed)
sjogren related neuropathy. Will increase gabapentin to 600mg  TID. lyrica was not tolerated well previously.

## 2016-05-29 NOTE — Patient Instructions (Addendum)
Let's increase gabapentin to 600mg  three times daily. If doing well, let me know and I will send in 600mg  dose.  Labs today to check cholesterol. Return after 9/22 for physical.

## 2016-05-29 NOTE — Assessment & Plan Note (Signed)
Update FLP on lovastatin MWF and zetia daily. Tolerating statin better with this dosing.

## 2016-05-29 NOTE — Assessment & Plan Note (Signed)
Latest Cr last month reviewed -stable .

## 2016-05-29 NOTE — Progress Notes (Signed)
Pre visit review using our clinic review tool, if applicable. No additional management support is needed unless otherwise documented below in the visit note. 

## 2016-05-29 NOTE — Assessment & Plan Note (Signed)
Chronic, stable. Continue current regimen. 

## 2016-05-29 NOTE — Progress Notes (Signed)
BP 116/70   Pulse 64   Ht '5\' 1"'  (1.549 m)   Wt 168 lb 12.8 oz (76.6 kg)   SpO2 98%   BMI 31.89 kg/m    CC: 68mof/u visit Subjective:    Patient ID: Alexandria Leach female    DOB: 502-Aug-1952 66y.o.   MRN: [id number] HPI: Alexandria AULDSis a 66y.o. female presenting on 05/29/2016 for Follow-up (4 month follow up, c/o worse back pain )   Using cane today.  Sjogren's - followed by rheum Dr DBronson Curb On plaquenil and tramadol and pilocarpine and restasis eye drops. Has established with Dr GDelman Cheadle(ophthalmology).  MDD followed by psych - they prescribe lexapro, xanax, ambien.   Ongoing neuropathy despite gabapentin 600/300/600. Describes burning/tingling of bilateral lower legs starting in feet and traveling up to knees. lyrica was not helpful. Saw Dr XErlinda Hong([facility]) - who did spine injection to help back pain. Alexandria Leach also sees neurology (Dr PLeta Baptist.   HLD - compliant with zetia and lovastatin MWF - tolerating better than prior daily dosing.   Has seen Dr DHarden Monephrologist - last ~2015. Released from her care due to stability of kidney disease. Latest Cr 1.1, 24hr urine protein normal (<458m.   Relevant past medical, surgical, family and social history reviewed and updated as indicated. Interim medical history since our last visit reviewed. Allergies and medications reviewed and updated. Outpatient Medications Prior to Visit  Medication Sig Dispense Refill  . ALPRAZolam (XANAX) 1 MG tablet Take 1 tablet (1 mg total) by mouth 3 (three) times daily as needed for anxiety. 270 tablet 1  . amLODipine (NORVASC) 10 MG tablet Take 1 tablet (10 mg total) by mouth daily. 90 tablet 3  . antiseptic oral rinse (BIOTENE) LIQD 15 mLs by Mouth Rinse route at bedtime.     . Marland Kitchenspirin 325 MG EC tablet Take 325 mg by mouth daily.     . cholecalciferol (VITAMIN D) 1000 UNITS tablet Take 1,000 Units by mouth daily.     . diclofenac sodium (VOLTAREN) 1 % GEL Apply 1 application topically 2 (two)  times daily as needed (pain).    . Marland Kitchenscitalopram (LEXAPRO) 20 MG tablet Take 1 tablet (20 mg total) by mouth 2 (two) times daily. 180 tablet 2  . famotidine (PEPCID) 20 MG tablet TAKE 1 TABLET BY MOUTH TWO  TIMES DAILY 180 tablet 3  . furosemide (LASIX) 20 MG tablet TAKE 1 TABLET BY MOUTH  DAILY AS NEEDED FOR EDEMA 90 tablet 3  . gabapentin (NEURONTIN) 300 MG capsule Take 2 capsules every morning, 1 capsule every afternoon and 2 capsules at bedtime. 450 capsule 1  . hydrochlorothiazide (MICROZIDE) 12.5 MG capsule TAKE 1 CAPSULE BY MOUTH  DAILY 90 capsule 2  . hydroxychloroquine (PLAQUENIL) 200 MG tablet Take 200 mg by mouth daily.     . metoprolol succinate (TOPROL-XL) 50 MG 24 hr tablet Take 1 tablet by mouth 2 (two) times daily.    . pilocarpine (SALAGEN) 5 MG tablet Take 1 tablet (5 mg total) by mouth 3 (three) times daily as needed. 270 tablet 1  . Polyethyl Glycol-Propyl Glycol (SYSTANE) 0.4-0.3 % GEL Place 1 drop into both eyes 2 (two) times daily.     . RESTASIS 0.05 % ophthalmic emulsion INSTILL ONE DROP IN BOTH  EYES TWO TIMES DAILY 90 each 3  . traMADol (ULTRAM) 50 MG tablet Take 1 tablet (50 mg total) by mouth every 12 (twelve) hours as needed for severe  pain. 30 tablet 3  . ezetimibe (ZETIA) 10 MG tablet Take 1 tablet (10 mg total) by mouth at bedtime. 90 tablet 3  . lovastatin (MEVACOR) 10 MG tablet Take 1 tablet (10 mg total) by mouth every Monday, Wednesday, and Friday.    . zolpidem (AMBIEN) 10 MG tablet Take 1 tablet (10 mg total) by mouth at bedtime as needed for sleep. 30 tablet 0   No facility-administered medications prior to visit.      Per HPI unless specifically indicated in ROS section below Review of Systems     Objective:    BP 116/70   Pulse 64   Ht '5\' 1"'  (1.549 m)   Wt 168 lb 12.8 oz (76.6 kg)   SpO2 98%   BMI 31.89 kg/m   Wt Readings from Last 3 Encounters:  05/29/16 168 lb 12.8 oz (76.6 kg)  05/14/16 166 lb (75.3 kg)  04/22/16 167 lb (75.8 kg)      Physical Exam  Constitutional: Alexandria Leach appears well-developed and well-nourished. No distress.  Ambulates with cane  HENT:  Mouth/Throat: Oropharynx is clear and moist. No oropharyngeal exudate.  Cardiovascular: Normal rate, regular rhythm and intact distal pulses.   Murmur (2/6 SEM best at USB) heard. Pulmonary/Chest: Effort normal and breath sounds normal. No respiratory distress. Alexandria Leach has no wheezes. Alexandria Leach has no rales.  Musculoskeletal: Alexandria Leach exhibits no edema.  Skin: Skin is warm and dry. No rash noted.  Psychiatric: Alexandria Leach has a normal mood and affect.  Nursing note and vitals reviewed.  Results for orders placed or performed in visit on 04/22/16  CBC with Differential/Platelet  Result Value Ref Range   WBC 3.7 (L) 3.8 - 10.8 K/uL   RBC 4.62 3.80 - 5.10 MIL/uL   Hemoglobin 12.7 11.7 - 15.5 g/dL   HCT 39.2 35.0 - 45.0 %   MCV 84.8 80.0 - 100.0 fL   MCH 27.5 27.0 - 33.0 pg   MCHC 32.4 32.0 - 36.0 g/dL   RDW 14.9 11.0 - 15.0 %   Platelets 196 140 - 400 K/uL   MPV 10.3 7.5 - 12.5 fL   Neutro Abs 1,739 1,500 - 7,800 cells/uL   Lymphs Abs 1,406 850 - 3,900 cells/uL   Monocytes Absolute 407 200 - 950 cells/uL   Eosinophils Absolute 111 15 - 500 cells/uL   Basophils Absolute 37 0 - 200 cells/uL   Neutrophils Relative % 47 %   Lymphocytes Relative 38 %   Monocytes Relative 11 %   Eosinophils Relative 3 %   Basophils Relative 1 %   Smear Review Criteria for review not met   COMPLETE METABOLIC PANEL WITH GFR  Result Value Ref Range   Sodium 138 135 - 146 mmol/L   Potassium 4.1 3.5 - 5.3 mmol/L   Chloride 105 98 - 110 mmol/L   CO2 26 20 - 31 mmol/L   Glucose, Bld 83 65 - 99 mg/dL   BUN 12 7 - 25 mg/dL   Creat 1.12 (H) 0.50 - 0.99 mg/dL   Total Bilirubin 0.4 0.2 - 1.2 mg/dL   Alkaline Phosphatase 48 33 - 130 U/L   AST 27 10 - 35 U/L   ALT 13 6 - 29 U/L   Total Protein 7.3 6.1 - 8.1 g/dL   Albumin 4.3 3.6 - 5.1 g/dL   Calcium 9.7 8.6 - 10.4 mg/dL   GFR, Est African American 60 >=60  mL/min   GFR, Est Non African American 52 (L) >=60 mL/min  Lab Results  Component Value Date   CHOL 222 (H) 11/28/2015   HDL 91.40 11/28/2015   LDLCALC 116 (H) 11/28/2015   TRIG 74.0 11/28/2015   CHOLHDL 2 11/28/2015       Assessment & Plan:   Problem List Items Addressed This Visit    CKD (chronic kidney disease) stage 3, GFR 30-59 ml/min    Latest Cr last month reviewed -stable .      Essential hypertension    Chronic, stable. Continue current regimen.       Relevant Medications   lovastatin (MEVACOR) 10 MG tablet   ezetimibe (ZETIA) 10 MG tablet   HYPERCHOLESTEROLEMIA - Primary    Update FLP on lovastatin MWF and zetia daily. Tolerating statin better with this dosing.       Relevant Medications   lovastatin (MEVACOR) 10 MG tablet   ezetimibe (ZETIA) 10 MG tablet   Other Relevant Orders   Lipid panel   Peripheral neuropathy ([place])    sjogren related neuropathy. Will increase gabapentin to 634m TID. lyrica was not tolerated well previously.      [place]    Appreciate rheum care. Has established with new optho.           Follow up plan: Return in about 6 months (around 11/29/2016) for annual exam, prior fasting for blood work.  JRia Bush MD

## 2016-06-04 ENCOUNTER — Ambulatory Visit (INDEPENDENT_AMBULATORY_CARE_PROVIDER_SITE_OTHER): Payer: Medicare Other | Admitting: Psychiatry

## 2016-06-04 ENCOUNTER — Encounter (HOSPITAL_COMMUNITY): Payer: Self-pay | Admitting: Psychiatry

## 2016-06-04 VITALS — BP 122/68 | HR 68 | Ht 61.0 in | Wt 172.2 lb

## 2016-06-04 DIAGNOSIS — F331 Major depressive disorder, recurrent, moderate: Secondary | ICD-10-CM

## 2016-06-04 DIAGNOSIS — Z79899 Other long term (current) drug therapy: Secondary | ICD-10-CM

## 2016-06-04 DIAGNOSIS — Z811 Family history of alcohol abuse and dependence: Secondary | ICD-10-CM | POA: Diagnosis not present

## 2016-06-04 DIAGNOSIS — Z818 Family history of other mental and behavioral disorders: Secondary | ICD-10-CM | POA: Diagnosis not present

## 2016-06-04 DIAGNOSIS — Z888 Allergy status to other drugs, medicaments and biological substances status: Secondary | ICD-10-CM

## 2016-06-04 MED ORDER — ZOLPIDEM TARTRATE 10 MG PO TABS: 10.0000 mg | ORAL_TABLET | Freq: Every evening | ORAL | 2 refills | Status: DC | PRN

## 2016-06-04 MED ORDER — ESCITALOPRAM OXALATE 20 MG PO TABS: 20.0000 mg | ORAL_TABLET | Freq: Two times a day (BID) | ORAL | 2 refills | Status: DC

## 2016-06-04 MED ORDER — ALPRAZOLAM 1 MG PO TABS: 1.0000 mg | ORAL_TABLET | Freq: Three times a day (TID) | ORAL | 1 refills | Status: DC | PRN
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
## 2016-06-05 ENCOUNTER — Encounter: Payer: Self-pay | Admitting: *Deleted

## 2016-06-05 DIAGNOSIS — Z79899 Other long term (current) drug therapy: Secondary | ICD-10-CM | POA: Insufficient documentation

## 2016-06-05 DIAGNOSIS — M069 Rheumatoid arthritis, unspecified: Secondary | ICD-10-CM | POA: Diagnosis not present

## 2016-07-15 ENCOUNTER — Encounter: Payer: Self-pay | Admitting: Rheumatology

## 2016-07-15 ENCOUNTER — Ambulatory Visit (INDEPENDENT_AMBULATORY_CARE_PROVIDER_SITE_OTHER): Payer: Medicare Other | Admitting: Rheumatology

## 2016-07-15 ENCOUNTER — Ambulatory Visit (INDEPENDENT_AMBULATORY_CARE_PROVIDER_SITE_OTHER): Payer: Medicare Other

## 2016-07-15 VITALS — BP 138/75 | HR 66 | Resp 15 | Ht 61.0 in | Wt 174.0 lb

## 2016-07-15 DIAGNOSIS — G8929 Other chronic pain: Secondary | ICD-10-CM

## 2016-07-15 DIAGNOSIS — M3501 Sicca syndrome with keratoconjunctivitis: Secondary | ICD-10-CM | POA: Diagnosis not present

## 2016-07-15 DIAGNOSIS — M8589 Other specified disorders of bone density and structure, multiple sites: Secondary | ICD-10-CM

## 2016-07-15 DIAGNOSIS — M7062 Trochanteric bursitis, left hip: Secondary | ICD-10-CM

## 2016-07-15 DIAGNOSIS — M797 Fibromyalgia: Secondary | ICD-10-CM

## 2016-07-15 DIAGNOSIS — Z79899 Other long term (current) drug therapy: Secondary | ICD-10-CM | POA: Diagnosis not present

## 2016-07-15 DIAGNOSIS — M545 Low back pain: Secondary | ICD-10-CM | POA: Diagnosis not present

## 2016-07-15 DIAGNOSIS — M5030 Other cervical disc degeneration, unspecified cervical region: Secondary | ICD-10-CM

## 2016-07-15 DIAGNOSIS — F5101 Primary insomnia: Secondary | ICD-10-CM | POA: Diagnosis not present

## 2016-07-15 DIAGNOSIS — R5383 Other fatigue: Secondary | ICD-10-CM

## 2016-07-15 MED ORDER — TRIAMCINOLONE ACETONIDE 40 MG/ML IJ SUSP
40.0000 mg | INTRAMUSCULAR | Status: AC | PRN
  Administered 2016-07-15: 40 mg via INTRA_ARTICULAR

## 2016-07-15 MED ORDER — LIDOCAINE HCL 1 % IJ SOLN
1.5000 mL | INTRAMUSCULAR | Status: AC | PRN
  Administered 2016-07-15: 1.5 mL

## 2016-07-15 NOTE — Patient Instructions (Addendum)
Iliotibial Bursitis Rehab Ask your health care provider which exercises are safe for you. Do exercises exactly as told by your health care provider and adjust them as directed. It is normal to feel mild stretching, pulling, tightness, or discomfort as you do these exercises, but you should stop right away if you feel sudden pain or your pain gets worse.Do not begin these exercises until told by your health care provider. Stretching and range of motion exercises These exercises warm up your muscles and joints and improve the movement and flexibility of your leg. These exercises also help to relieve pain and stiffness. Exercise A: Quadriceps stretch, prone   1. Lie on your abdomen on a firm surface, such as a bed or padded floor. 2. Bend your left / right knee and hold your ankle. If you cannot reach your ankle or pant leg, loop a belt around your foot and grab the belt instead. 3. Gently pull your heel toward your buttocks. Your knee should not slide out to the side. You should feel a stretch in the front of your thigh and knee. 4. Hold this position for __________ seconds. Repeat __________ times. Complete this exercise __________ times a day. Exercise B: Lunge (  adductor stretch) 1. Stand and spread your legs about 3 feet (about 1 m) apart. Put your left / right leg slightly back for balance. 2. Lean away from your left / right leg by bending your other knee and shifting your weight toward your bent knee. You may rest your hands on your thigh for balance. You should feel a stretch in your left / right inner thigh. 3. Hold for __________ seconds. Repeat __________ times. Complete this exercise __________ times a day. Exercise C: Hamstring stretch, supine  1. Lie on your back. 2. Hold both ends of a belt or towel as you loop it over the ball of your left / right foot. The ball of your foot is on the walking surface, right under your toes. 3. Straighten your left / right knee and slowly pull on  the belt to raise your leg. Stop when you feel a gentle stretch in the back of your left / right knee or thigh.  Do not let your left / right knee bend.  Keep your other leg flat on the floor. 4. Hold this position for __________ seconds. Repeat __________ times. Complete this exercise __________ times a day. Strengthening exercises These exercises build strength and endurance in your leg. Endurance is the ability to use your muscles for a long time, even after they get tired. Exercise D: Quadriceps wall slides  1. Lean your back against a smooth wall or door while you walk your feet out 18-24 inches (46-61 cm) from it. 2. Place your feet hip-width apart. 3. Slowly slide down the wall or door until your knees bend as far as told by your health care provider. Keep your knees over your heels, not your toes. Keep your knees in line with your hips. 4. Hold for __________ seconds. 5. Push through your heels to stand up to rest for __________ seconds after each repetition. Repeat __________ times. Complete this exercise __________ times a day. Exercise E: Straight leg raises ( hip abductors) 1. Lie on your side, with your left / right leg in the top position. Lie so your head, shoulder, knee, and hip line up with each other. You may bend your bottom knee to help you balance. 2. Lift your top leg 4-6 inches (10-15 cm) while   keeping your toes pointed straight ahead. 3. Hold this position for __________ seconds. 4. Slowly lower your leg to the starting position. Allow your muscles to relax completely after each repetition. Repeat __________ times. Complete this exercise __________ times a day. Exercise F: Straight leg raises ( hip extensors) 1. Lie on your abdomen on a firm surface. You can put a pillow under your hips if that is more comfortable. 2. Tense the muscles in your buttocks and lift your left / right leg about 4-6 inches (10-15 cm). Keep your knee straight as you lift your leg. 3. Hold  this position for __________ seconds. 4. Slowly lower your leg to the starting position. 5. Let your leg relax completely after each repetition. Repeat __________ times. Complete this exercise __________ times a day. Exercise G: Bridge ( hip extensors) 1. Lie on your back on a firm surface with your knees bent and your feet flat on the floor. 2. Tighten your buttocks muscles and lift your bottom off the floor until your trunk is level with your thighs.  Do not arch your back.  You should feel the muscles working in your buttocks and the back of your thighs. If you do not feel these muscles, slide your feet 1-2 inches (2.5-5 cm) farther away from your buttocks. 3. Hold this position for __________ seconds. 4. Slowly lower your hips to the starting position. 5. Let your buttocks muscles relax completely between repetitions. 6. If this exercise is too easy, try doing it with your arms crossed over your chest. Repeat __________ times. Complete this exercise __________ times a day. This information is not intended to replace advice given to you by your health care provider. Make sure you discuss any questions you have with your health care provider. Document Released: 03/04/2005 Document Revised: 11/09/2015 Document Reviewed: 02/14/2015 Elsevier Interactive Patient Education  2017 [place]. Back Exercises The following exercises strengthen the muscles that help to support the back. They also help to keep the lower back flexible. Doing these exercises can help to prevent back pain or lessen existing pain. If you have back pain or discomfort, try doing these exercises 2-3 times each day or as told by your health care provider. When the pain goes away, do them once each day, but increase the number of times that you repeat the steps for each exercise (do more repetitions). If you do not have back pain or discomfort, do these exercises once each day or as told by your health care  provider. Exercises Single Knee to Chest   Repeat these steps 3-5 times for each leg: 5. Lie on your back on a firm bed or the floor with your legs extended. 6. Bring one knee to your chest. Your other leg should stay extended and in contact with the floor. 7. Hold your knee in place by grabbing your knee or thigh. 8. Pull on your knee until you feel a gentle stretch in your lower back. 9. Hold the stretch for 10-30 seconds. 10. Slowly release and straighten your leg. Pelvic Tilt   Repeat these steps 5-10 times: 4. Lie on your back on a firm bed or the floor with your legs extended. 5. Bend your knees so they are pointing toward the ceiling and your feet are flat on the floor. 6. Tighten your lower abdominal muscles to press your lower back against the floor. This motion will tilt your pelvis so your tailbone points up toward the ceiling instead of pointing to your feet or  the floor. 7. With gentle tension and even breathing, hold this position for 5-10 seconds. Cat-Cow   Repeat these steps until your lower back becomes more flexible: 1. Get into a hands-and-knees position on a firm surface. Keep your hands under your shoulders, and keep your knees under your hips. You may place padding under your knees for comfort. 2. Let your head hang down, and point your tailbone toward the floor so your lower back becomes rounded like the back of a cat. 3. Hold this position for 5 seconds. 4. Slowly lift your head and point your tailbone up toward the ceiling so your back forms a sagging arch like the back of a cow. 5. Hold this position for 5 seconds. Press-Ups   Repeat these steps 5-10 times: 6. Lie on your abdomen (face-down) on the floor. 7. Place your palms near your head, about shoulder-width apart. 8. While you keep your back as relaxed as possible and keep your hips on the floor, slowly straighten your arms to raise the top half of your body and lift your shoulders. Do not use your back  muscles to raise your upper torso. You may adjust the placement of your hands to make yourself more comfortable. 9. Hold this position for 5 seconds while you keep your back relaxed. 10. Slowly return to lying flat on the floor. Bridges   Repeat these steps 10 times: 5. Lie on your back on a firm surface. 6. Bend your knees so they are pointing toward the ceiling and your feet are flat on the floor. 7. Tighten your buttocks muscles and lift your buttocks off of the floor until your waist is at almost the same height as your knees. You should feel the muscles working in your buttocks and the back of your thighs. If you do not feel these muscles, slide your feet 1-2 inches farther away from your buttocks. 8. Hold this position for 3-5 seconds. 9. Slowly lower your hips to the starting position, and allow your buttocks muscles to relax completely. If this exercise is too easy, try doing it with your arms crossed over your chest. Abdominal Crunches   Repeat these steps 5-10 times: 6. Lie on your back on a firm bed or the floor with your legs extended. 7. Bend your knees so they are pointing toward the ceiling and your feet are flat on the floor. 8. Cross your arms over your chest. 9. Tip your chin slightly toward your chest without bending your neck. 10. Tighten your abdominal muscles and slowly raise your trunk (torso) high enough to lift your shoulder blades a tiny bit off of the floor. Avoid raising your torso higher than that, because it can put too much stress on your low back and it does not help to strengthen your abdominal muscles. 11. Slowly return to your starting position. Back Lifts  Repeat these steps 5-10 times: 1. Lie on your abdomen (face-down) with your arms at your sides, and rest your forehead on the floor. 2. Tighten the muscles in your legs and your buttocks. 3. Slowly lift your chest off of the floor while you keep your hips pressed to the floor. Keep the back of your head  in line with the curve in your back. Your eyes should be looking at the floor. 4. Hold this position for 3-5 seconds. 5. Slowly return to your starting position. Contact a health care provider if:  Your back pain or discomfort gets much worse when you do an exercise.  Your back pain or discomfort does not lessen within 2 hours after you exercise. If you have any of these problems, stop doing these exercises right away. Do not do them again unless your health care provider says that you can. Get help right away if:  You develop sudden, severe back pain. If this happens, stop doing the exercises right away. Do not do them again unless your health care provider says that you can. This information is not intended to replace advice given to you by your health care provider. Make sure you discuss any questions you have with your health care provider. Document Released: 04/11/2004 Document Revised: 07/12/2015 Document Reviewed: 04/28/2014 Elsevier Interactive Patient Education  2017 [place] We placed an order today for your standing lab work.    Please come back and get your standing labs in July  We have open lab Monday through Friday from 8:30-11:30 AM and 1:30-4 PM at the office of Dr. Tresa Moore, PA.   The office is located at [address] [place], [place], [place] [address] No appointment is necessary.   Labs are drawn by [facility].  You may receive a bill from [facility] for your lab work.

## 2016-07-15 NOTE — Progress Notes (Signed)
Office Visit Note  Patient: Alexandria Leach             Date of Birth: [birth year]           MRN: [id number]             PCP: Ria Bush, MD Referring: Ria Bush, MD Visit Date: 07/15/2016 Occupation: @GUAROCC @    Subjective:  Back Pain (Low back pain x 1 month, worsening, no injury, pain radiating down left lateral leg)   History of Present Illness: Alexandria Leach is a 66 y.o. female with a history of fibromyalgia, cervical DDD, and sjogren's syndrome.  Patient states she has been experiencing increased lower back and bilateral hip pain that has been worsening over the past month.  She states the pain has become 10/10 this past week.  Patient states the pain on her left lower back has started radiating down her left leg to the lateral aspect of her lower leg.  She states her back pain is worse with flexion and after sitting for long periods of time.  She also states she is having right shoulder pain, which she uses voltaren cream once daily.    Patient continues to have sicca symptoms but they're stable. She has some generalized pain from fibromyalgia. She has some minimal discomfort in her neck right now. She describes some pain in the right trapezius area.  Activities of Daily Living:  Patient reports morning stiffness for 0 minutes.   Patient Reports nocturnal pain.  Difficulty dressing/grooming: Denies Difficulty climbing stairs: Reports Difficulty getting out of chair: Reports Difficulty using hands for taps, buttons, cutlery, and/or writing: Denies   Review of Systems  Constitutional: Negative for fatigue, weight gain and weight loss.  HENT: Negative for mouth sores, mouth dryness and nose dryness.   Eyes: Negative for dryness.  Respiratory: Negative for cough, shortness of breath and difficulty breathing.   Cardiovascular: Negative for chest pain, palpitations and irregular heartbeat.  Gastrointestinal: Negative for constipation, diarrhea, nausea and  vomiting.  Musculoskeletal: Positive for arthralgias and joint pain. Negative for joint swelling, myalgias, muscle weakness, morning stiffness and myalgias.  Skin: Negative for color change, rash and ulcers.  Hematological: Negative for swollen glands.  Psychiatric/Behavioral: Negative for depressed mood and sleep disturbance. The patient is nervous/anxious.     PMFS History:  Patient Active Problem List   Diagnosis Date Noted  . High risk medication use 06/05/2016  . Peripheral neuropathy 01/30/2016  . DNR no code (do not resuscitate) 12/08/2015  . 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 TIA (transient ischemic attack) 10/12/2015  . Abnormal urinalysis 10/12/2015  . Mild mitral regurgitation 10/12/2015  . Osteopenia 09/18/2015  . Right arm pain 08/29/2015  . Fall   . Trimalleolar fracture of left ankle 02/23/2015  . Right sided sciatica 02/21/2015  . Imbalance 01/12/2015  . Transaminitis 12/24/2014  . DDD (degenerative disc disease), cervical   . Health maintenance examination 10/18/2014  . Advanced care planning/counseling discussion 10/18/2014  . Medicare annual wellness visit, initial 10/18/2014  . Refusal of blood transfusions as patient is Jehovah's Witness   . Sjogren's syndrome ([place])   . CKD (chronic kidney disease) stage 3, GFR 30-59 ml/min   . Glaucoma   . Fibromyalgia   . Osteoarthritis   . History of pulmonary embolism   . Lung nodule 09/22/2012  . Diastolic CHF ([place]) 60/63/0160  . Aortic stenosis 04/22/2011  . Palpitations 08/10/2010  . Dyspnea 08/10/2010  . HOT FLASHES 06/28/2008  .  Trochanteric bursitis of right hip 06/28/2008  . ARTHRALGIA 07/01/2007  . BACK PAIN, LEFT 08/25/2006  . GAD (generalized anxiety disorder) 03/28/2006  . MITRAL VALVE REPLACEMENT, HX OF 03/28/2006  . TAH/BSO, HX OF 03/28/2006  . HYPERCHOLESTEROLEMIA 12/31/2005  . MDD (major depressive disorder), recurrent episode ([place]) 12/31/2005  . RHEUMATIC HEART DISEASE  12/31/2005  . Essential hypertension 12/31/2005  . Chronic insomnia 12/31/2005    Past Medical History:  Diagnosis Date  . Ankle fracture, left 02/23/2015  . Ankle fracture, left 02/19/2015   from a fall  . Anxiety   . CHF (congestive heart failure) ([place])   . CKD (chronic kidney disease) stage 3, GFR 30-59 ml/min    saw nephrologist Dr Harden Mo  . DDD (degenerative disc disease), cervical   . Depression   . Fibromyalgia   . Glaucoma    s/p surgery, sees ophtho Q6 mo  . 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 History of kidney stones 2010  . History of pulmonary embolism 2001, 2006   completed coumadin courses  . History of rheumatic fever x3  . HLD (hyperlipidemia)   . HTN (hypertension)   . Insomnia   . Lung nodule 09/22/2012   RLL - 66mm, stable since 2014. Thought benign.   . Osteoarthritis    shoulders and knees, not RA per Dr Estanislado Pandy, positive ANA, positive Ro  . Osteopenia 09/18/2015   DEXA T -1.1 hip, -0.2 spine 08/2015   . Personal history of urinary calculi latest 2014  . Pneumonia 12/02/2011  . PONV (postoperative nausea and vomiting)   . Refusal of blood transfusions as patient is Jehovah's Witness   . Rheumatic heart disease 1980   s/p mitral valve repair 1980  . Sjogren's syndrome ([place])     Family History  Problem Relation Age of Onset  . Lupus Sister     and niece  . Cancer Mother     lung (nonsmoker)  . CAD Mother     MI in her 80s  . ALS Mother   . Kidney disease Father   . Alcohol abuse Father   . Diabetes Father   . Cancer Brother     bone  . Diabetes Sister   . Stroke Sister   . Cancer Maternal Uncle     bone  . Depression Sister   . Kidney failure Other     on HD  . Diabetes Brother   . Heart attack Brother   . Stroke Maternal Grandmother    Past Surgical History:  Procedure Laterality Date  . BREAST BIOPSY Right 2006   benign  . CHOLECYSTECTOMY  11/27/2011   Procedure:  LAPAROSCOPIC CHOLECYSTECTOMY WITH INTRAOPERATIVE CHOLANGIOGRAM;  Surgeon: Adin Hector, MD;  Location: [facility];  Service: General;  Laterality: N/A;  laparoscopic cholecystectomy with choleangiogram umbilical hernia repair  . COLONOSCOPY  07/2014   WNL Amedeo Plenty)  . dexa  08/2012   normal per patient - no records available  . MITRAL VALVE REPAIR  1980   open heart  . ORIF ANKLE FRACTURE Left 02/26/2015   Procedure: OPEN REDUCTION INTERNAL FIXATION (ORIF) LEFT TRIMALLEOLAR ANKLE FRACTURE;  Surgeon: [name], MD;  Location: [facility];  Service: Orthopedics;  Laterality: Left;  . TUBAL LIGATION  1980  . UMBILICAL HERNIA REPAIR  11/27/2011   Procedure: HERNIA REPAIR UMBILICAL ADULT;  Surgeon: Adin Hector, MD;  Location: [facility];  Service: General;  Laterality: N/A;  .  VAGINAL HYSTERECTOMY  1992   for fibroids -- partial, ovaries remain   Social History   Social History Narrative   Lives with son, 1 dog   Occupation: unemployed, on disability for fibromyalgia since 2008.   Edu: HS   Religion: Jehova's witness   Activity: volunteers at senior center   Diet: some water, fruits/vegetables daily   No caffeine use     Objective: Vital Signs: BP 138/75 (BP Location: Left Arm, Patient Position: Sitting, Cuff Size: Normal)   Pulse 66   Resp 15   Ht 5\' 1"  (1.549 m)   Wt 174 lb (78.9 kg)   BMI 32.88 kg/m    Physical Exam   Musculoskeletal Exam: C-spine good range of motion and thoracic spine good range of motion she has discomfort with range of motion of her lumbar spine and some tenderness on palpation of her lumbar region. She is good range of motion of her shoulders discomfort with range of motion of her right shoulder and trapezius spasm. Elbow joints wrist joints are good range of motion. She is some DIP thickening in her hands consistent with osteoarthritis. She had tenderness on palpation over left trochanteric bursa consistent with trochanteric bursitis. Hip joints knee joints ankles  MTPs PIPs with good range of motion with no synovitis. CDAI Exam: No CDAI exam completed.    Investigation: No additional findings.   Imaging: Xr Lumbar Spine 2-3 Views  Result Date: 07/15/2016 Patient has dextroscoliosis with mild is spondylosis and facet joint arthropathy. No vertebral fracture was noted.   Speciality Comments: No specialty comments available.    Procedures:  Large Joint Inj Date/Time: 07/15/2016 12:41 PM Performed by: Bo Merino Authorized by: Bo Merino   Consent Given by:  Patient Site marked: the procedure site was marked   Timeout: prior to procedure the correct patient, procedure, and site was verified   Indications:  Pain Location:  Hip Site:  L greater trochanter Prep: patient was prepped and draped in usual sterile fashion   Needle Size:  27 G Needle Length:  1.5 inches Approach:  Lateral Ultrasound Guidance: No   Fluoroscopic Guidance: No   Arthrogram: No   Medications:  40 mg triamcinolone acetonide 40 MG/ML; 1.5 mL lidocaine 1 % Aspiration Attempted: No   Aspirate amount (mL):  0 Patient tolerance:  Patient tolerated the procedure well with no immediate complications   Allergies: Cymbalta [duloxetine hcl]; Statins; and Sulfa antibiotics   Assessment / Plan:     Visit Diagnoses: Sjogren's syndrome with keratoconjunctivitis sicca ([place]) patient continues to have sicca symptoms but the symptoms are tolerable. She's been using pilocarpine and over-the-counter products.  High risk medication use - Plaquenil 200 mg by mouth daily Monday to Friday, pilocarpine 5 mg by mouth 3 times a day when necessary. Her labs and eye exam has been normal. Next labs will due in July   Fibromyalgia: She continues to have generalized pain and discomfort  Trochanteric bursitis of left hip: She has severe tenderness over left trochanteric area which is radiating down into her left lower extremity. After different treatment options were discussed  and informed consent was obtained left trochanteric area was injected with cortisone which is described above. She tolerated the procedure well.  Chronic left-sided low back pain, with sciatica presence unspecified - Plan: XR Lumbar Spine 2-3 Views which showed mild spondylosis and facet joint arthropathy. I believe her back pain is coming from walking awkward due to trochanteric bursitis. I offered muscle relaxer. Patient believes she  has a muscle relaxer at home and she will take that.  DDD (degenerative disc disease), cervical: She is not in much discomfort.  Other fatigue  Primary insomnia  Osteopenia of multiple sites    Orders: Orders Placed This Encounter  Procedures  . Large Joint Injection/Arthrocentesis  . XR Lumbar Spine 2-3 Views  . CBC with Differential/Platelet  . COMPLETE METABOLIC PANEL WITH GFR   No orders of the defined types were placed in this encounter.   Face-to-face time spent with patient was 30 minutes. 50% of time was spent in counseling and coordination of care.  Follow-Up Instructions: Return in about 4 months (around 11/14/2016) for Lower back pain, trochanteric bursitis, Sjogren's.   Bo Merino, MD  Note - This record has been created using Editor, commissioning.  Chart creation errors have been sought, but may not always  have been located. Such creation errors do not reflect on  the standard of medical care.

## 2016-07-19 ENCOUNTER — Other Ambulatory Visit: Payer: Self-pay | Admitting: Family Medicine

## 2016-07-19 DIAGNOSIS — Z1231 Encounter for screening mammogram for malignant neoplasm of breast: Secondary | ICD-10-CM

## 2016-08-16 ENCOUNTER — Ambulatory Visit
Admission: RE | Admit: 2016-08-16 | Discharge: 2016-08-16 | Disposition: A | Discharge status: Home / Self Care | Payer: Medicare Other | Source: Ambulatory Visit | Attending: Family Medicine | Admitting: Family Medicine

## 2016-08-16 ENCOUNTER — Other Ambulatory Visit: Payer: Self-pay | Admitting: Rheumatology

## 2016-08-16 DIAGNOSIS — Z1231 Encounter for screening mammogram for malignant neoplasm of breast: Secondary | ICD-10-CM | POA: Diagnosis not present

## 2016-08-16 LAB — HM MAMMOGRAPHY

## 2016-08-16 NOTE — Telephone Encounter (Signed)
Last Visit: 07/15/16 Next Visit: 11/14/16 Labs: 04/22/16 Creat 1.12 Previous Creat 1.43 PLQ Eye Exam: 06/05/16  Okay to refill PLQ?

## 2016-08-19 ENCOUNTER — Encounter: Payer: Self-pay | Admitting: *Deleted

## 2016-09-04 ENCOUNTER — Encounter (HOSPITAL_COMMUNITY): Payer: Self-pay | Admitting: Psychiatry

## 2016-09-04 ENCOUNTER — Ambulatory Visit (INDEPENDENT_AMBULATORY_CARE_PROVIDER_SITE_OTHER): Payer: Medicare Other | Admitting: Psychiatry

## 2016-09-04 VITALS — BP 148/80 | HR 72 | Ht 61.0 in | Wt 171.8 lb

## 2016-09-04 DIAGNOSIS — Z811 Family history of alcohol abuse and dependence: Secondary | ICD-10-CM | POA: Diagnosis not present

## 2016-09-04 DIAGNOSIS — Z818 Family history of other mental and behavioral disorders: Secondary | ICD-10-CM

## 2016-09-04 DIAGNOSIS — F331 Major depressive disorder, recurrent, moderate: Secondary | ICD-10-CM | POA: Diagnosis not present

## 2016-09-04 MED ORDER — ALPRAZOLAM 1 MG PO TABS: 1.0000 mg | ORAL_TABLET | Freq: Three times a day (TID) | ORAL | 1 refills | Status: DC | PRN

## 2016-09-04 MED ORDER — ESCITALOPRAM OXALATE 20 MG PO TABS: 20.0000 mg | ORAL_TABLET | Freq: Two times a day (BID) | ORAL | 2 refills | Status: DC

## 2016-09-04 MED ORDER — ZOLPIDEM TARTRATE 10 MG PO TABS: 10.0000 mg | ORAL_TABLET | Freq: Every evening | ORAL | 2 refills | Status: DC | PRN
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
## 2016-09-09 ENCOUNTER — Ambulatory Visit (HOSPITAL_COMMUNITY)
Admission: EM | Admit: 2016-09-09 | Discharge: 2016-09-09 | Disposition: A | Discharge status: Home / Self Care | Payer: Medicare Other | Attending: Family Medicine | Admitting: Family Medicine

## 2016-09-09 ENCOUNTER — Encounter (HOSPITAL_COMMUNITY): Payer: Self-pay | Admitting: Emergency Medicine

## 2016-09-09 ENCOUNTER — Telehealth: Payer: Self-pay

## 2016-09-09 DIAGNOSIS — R252 Cramp and spasm: Secondary | ICD-10-CM | POA: Diagnosis not present

## 2016-09-09 DIAGNOSIS — R42 Dizziness and giddiness: Secondary | ICD-10-CM

## 2016-09-09 LAB — POCT I-STAT, CHEM 8
BUN: 10 mg/dL (ref 6–20)
CALCIUM ION: 1.28 mmol/L (ref 1.15–1.40)
CREATININE: 1 mg/dL (ref 0.44–1.00)
Chloride: 103 mmol/L (ref 101–111)
Glucose, Bld: 86 mg/dL (ref 65–99)
HEMATOCRIT: 41 % (ref 36.0–46.0)
HEMOGLOBIN: 13.9 g/dL (ref 12.0–15.0)
Potassium: 4.2 mmol/L (ref 3.5–5.1)
SODIUM: 141 mmol/L (ref 135–145)
TCO2: 28 mmol/L (ref 0–100)

## 2016-09-09 NOTE — Telephone Encounter (Signed)
Noted. plz call tomorrow for an update.  

## 2016-09-09 NOTE — Telephone Encounter (Signed)
Pt walked in with weakness and lightheaded for 2 weeks; no CP or SOB. No available appts at [facility] today; offered to schedule appt at another [facility] site today or schedule appt at [facility] on 09/10/16. Pt said no she would go to UC now. Someone else is driving pt and she does not look in any distress. FYI to Dr Darnell Level.

## 2016-09-09 NOTE — ED Provider Notes (Signed)
CSN: [id number]     Arrival date & time 09/09/16  1520 History   First MD Initiated Contact with Patient 09/09/16 1708     Chief Complaint  Patient presents with  . Muscle Pain   (Consider location/radiation/quality/duration/timing/severity/associated sxs/prior Treatment) 66 year old female with a history of CHF, CK D stage III, DDD, fibromyalgia, history of DVT, sjogren's syndrome, pulmonary embolism presents to the urgent care today with complaints of a two-week history of lightheadedness and muscle cramps primarily in the calves and toes. The cramps occur primarily at nighttime. Occasionally during the day she will have cramps as well. Denies shortness of breath or chest pain. It is noted she is taking Lasix and HCTZ. She has had similar symptoms in the remote past and was told that her potassium was low.      Past Medical History:  Diagnosis Date  . Ankle fracture, left 02/23/2015  . Ankle fracture, left 02/19/2015   from a fall  . Anxiety   . CHF (congestive heart failure) ([place])   . CKD (chronic kidney disease) stage 3, GFR 30-59 ml/min    saw nephrologist Dr Harden Mo  . DDD (degenerative disc disease), cervical   . Depression   . Fibromyalgia   . Glaucoma    s/p surgery, sees ophtho Q6 mo  . 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 History of kidney stones 2010  . History of pulmonary embolism 2001, 2006   completed coumadin courses  . History of rheumatic fever x3  . HLD (hyperlipidemia)   . HTN (hypertension)   . Insomnia   . Lung nodule 09/22/2012   RLL - 35mm, stable since 2014. Thought benign.   . Osteoarthritis    shoulders and knees, not RA per Dr Estanislado Pandy, positive ANA, positive Ro  . Osteopenia 09/18/2015   DEXA T -1.1 hip, -0.2 spine 08/2015   . Personal history of urinary calculi latest 2014  . Pneumonia 12/02/2011  . PONV (postoperative nausea and vomiting)   . Refusal of blood transfusions as  patient is Jehovah's Witness   . Rheumatic heart disease 1980   s/p mitral valve repair 1980  . Sjogren's syndrome [facility])    Past Surgical History:  Procedure Laterality Date  . BREAST BIOPSY Right 2006   benign  . CHOLECYSTECTOMY  11/27/2011   Procedure: LAPAROSCOPIC CHOLECYSTECTOMY WITH INTRAOPERATIVE CHOLANGIOGRAM;  Surgeon: Adin Hector, MD;  Location: [place];  Service: General;  Laterality: N/A;  laparoscopic cholecystectomy with choleangiogram umbilical hernia repair  . COLONOSCOPY  07/2014   WNL Amedeo Plenty)  . dexa  08/2012   normal per patient - no records available  . MITRAL VALVE REPAIR  1980   open heart  . ORIF ANKLE FRACTURE Left 02/26/2015   Procedure: OPEN REDUCTION INTERNAL FIXATION (ORIF) LEFT TRIMALLEOLAR ANKLE FRACTURE;  Surgeon: [name], MD;  Location: [place];  Service: Orthopedics;  Laterality: Left;  . TUBAL LIGATION  1980  . UMBILICAL HERNIA REPAIR  11/27/2011   Procedure: HERNIA REPAIR UMBILICAL ADULT;  Surgeon: Adin Hector, MD;  Location: [place];  Service: General;  Laterality: N/A;  . [place]   for fibroids -- partial, ovaries remain   Family History  Problem Relation Age of Onset  . Lupus Sister        and niece  . Cancer Mother        lung (nonsmoker)  . CAD Mother  MI in her 66s  . ALS Mother   . Kidney disease Father   . Alcohol abuse Father   . Diabetes Father   . Cancer Brother        bone  . Diabetes Sister   . Stroke Sister   . Cancer Maternal Uncle        bone  . Depression Sister   . Kidney failure Other        on HD  . Diabetes Brother   . Heart attack Brother   . Stroke Maternal Grandmother    Social History  Substance Use Topics  . Smoking status: Never Smoker  . Smokeless tobacco: Never Used  . Alcohol use No   OB History    No data available     Review of Systems  Constitutional: Negative.  Negative for fever.  HENT: Negative.   Respiratory: Negative.   Cardiovascular: Negative for  chest pain, palpitations and leg swelling.  Gastrointestinal: Negative.   Genitourinary: Negative.   Musculoskeletal: Negative for arthralgias.  Neurological: Positive for light-headedness.  All other systems reviewed and are negative.   Allergies  Cymbalta [duloxetine hcl]; Statins; and Sulfa antibiotics  Home Medications   Prior to Admission medications   Medication Sig Start Date End Date Taking? Authorizing Provider  ALPRAZolam Duanne Moron) 1 MG tablet Take 1 tablet (1 mg total) by mouth 3 (three) times daily as needed for anxiety. 09/04/16 09/04/17  Cloria Spring, MD  amLODipine (NORVASC) 10 MG tablet Take 1 tablet (10 mg total) by mouth daily. 12/27/15   Ria Bush, MD  antiseptic oral rinse (BIOTENE) LIQD 15 mLs by Mouth Rinse route at bedtime.     [provider]  aspirin 325 MG EC tablet Take 325 mg by mouth daily.     [provider]  cholecalciferol (VITAMIN D) 1000 UNITS tablet Take 1,000 Units by mouth daily.     [provider]  diclofenac sodium (VOLTAREN) 1 % GEL Apply 1 application topically 2 (two) times daily as needed (pain).    [provider]  escitalopram (LEXAPRO) 20 MG tablet Take 1 tablet (20 mg total) by mouth 2 (two) times daily. 09/04/16 09/04/17  Cloria Spring, MD  ezetimibe (ZETIA) 10 MG tablet Take 1 tablet (10 mg total) by mouth at bedtime. 05/29/16   Ria Bush, MD  famotidine (PEPCID) 20 MG tablet TAKE 1 TABLET BY MOUTH TWO  TIMES DAILY 03/22/16   Ria Bush, MD  furosemide (LASIX) 20 MG tablet TAKE 1 TABLET BY MOUTH  DAILY AS NEEDED FOR EDEMA 03/20/16   Ria Bush, MD  gabapentin (NEURONTIN) 300 MG capsule Take 2 capsules (600 mg total) by mouth 3 (three) times daily. Take 2 capsules every morning, 1 capsule every afternoon and 2 capsules at bedtime. 05/29/16   Ria Bush, MD  hydrochlorothiazide (MICROZIDE) 12.5 MG capsule TAKE 1 CAPSULE BY MOUTH  DAILY 02/14/16   Ria Bush, MD   hydroxychloroquine (PLAQUENIL) 200 MG tablet TAKE 1 TABLET BY MOUTH ONCE A DAY ON MONDAY THROUGH  FRIDAY 08/16/16 11/14/16  Panwala, Naitik, PA-C  lovastatin (MEVACOR) 10 MG tablet Take 1 tablet (10 mg total) by mouth every Monday, Wednesday, and Friday. 05/29/16   Ria Bush, MD  metoprolol succinate (TOPROL-XL) 50 MG 24 hr tablet Take 1 tablet by mouth 2 (two) times daily. 03/20/16   [provider]  pilocarpine (SALAGEN) 5 MG tablet Take 1 tablet (5 mg total) by mouth 3 (three) times daily as needed. 05/20/16  11/16/16  Panwala, Naitik, PA-C  Polyethyl Glycol-Propyl Glycol (SYSTANE) 0.4-0.3 % GEL Place 1 drop into both eyes 2 (two) times daily.     [provider]  RESTASIS 0.05 % ophthalmic emulsion INSTILL ONE DROP IN BOTH  EYES TWO TIMES DAILY 03/20/16   Ria Bush, MD  traMADol (ULTRAM) 50 MG tablet Take 1 tablet (50 mg total) by mouth every 12 (twelve) hours as needed for severe pain. 09/08/14   Ria Bush, MD  zolpidem (AMBIEN) 10 MG tablet Take 1 tablet (10 mg total) by mouth at bedtime as needed for sleep. 09/04/16 10/04/16  Cloria Spring, MD   Meds Ordered and Administered this Visit  Medications - No data to display  BP (!) 143/76 (BP Location: Right Arm)   Pulse 69   Temp 98 F (36.7 C) (Oral)   Resp 18   SpO2 100%  Orthostatic VS for the past 24 hrs:  BP- Lying Pulse- Lying BP- Sitting Pulse- Sitting BP- Standing at 0 minutes Pulse- Standing at 0 minutes  09/09/16 1720 140/69 70 147/74 74 144/72 72    Physical Exam  Constitutional: She is oriented to person, place, and time. She appears well-developed and well-nourished. No distress.  Neck: Neck supple.  Cardiovascular: Normal rate, regular rhythm, normal heart sounds and intact distal pulses.   Pulmonary/Chest: Effort normal and breath sounds normal.  Abdominal: Soft. There is no tenderness.  Musculoskeletal: She exhibits no edema or deformity.  Calves without swelling, lower extremities  without edema. Right Slightly larger than left but both are soft, nontender and without tension. No ankle or feet edema. Pedal pulses are 2+. Lower extremities including feet are warm with normal neurovascular motor sensory.  Neurological: She is alert and oriented to person, place, and time.  Skin: Skin is warm and dry.  Psychiatric: She has a normal mood and affect. Her behavior is normal.  Nursing note and vitals reviewed.   Urgent Care Course     Procedures (including critical care time)  Labs Review Labs Reviewed  POCT I-STAT, CHEM 8    Imaging Review No results found.   Visual Acuity Review  Right Eye Distance:   Left Eye Distance:   Bilateral Distance:    Right Eye Near:   Left Eye Near:    Bilateral Near:         MDM   1. Muscle cramps at night   2. Episodic lightheadedness    Perform the calf stretches as demonstrated. Do this especially before going to bed at night, in the morning, after awakening and during the day. Apply heat to the calf muscles occasionally. You can try quinine water prior to going to bed. Follow-up with your primary care doctor next month. Her lab work was completely normal today and your vital signs were normal.     Janne Napoleon, NP 09/09/16 1807

## 2016-09-09 NOTE — Telephone Encounter (Signed)
Patient called team health prior to walking in at [facility].    See [facility] Health call report:   Patient Name: Alexandria Leach Gender: Female DOB: January 09, 1951 Age: 66 Y 44 M 16 D Return Phone Number: [phone] (Primary), [phone] (Secondary), [phone] (Alternate) City/State/Zip: [place] Client [place] [facility] - Client Client Site [place] Physician Ria Bush - MD Who Is Calling Patient / Member / Family / Caregiver Call Type Triage / Clinical Relationship To Patient Self Return Phone Number [phone] (Alternate) Chief Complaint Dizziness Reason for Call Symptomatic / Request for [place] states she has been lightheaded and dizzy. She has had low potassium that gave her this problem in the past. Appointment Disposition EMR Caller Not Reached Info pasted into Epic No Nurse Assessment Guidelines Guideline Title Affirmed Question Disp. Time Eilene Ghazi Time) Disposition Final User 09/09/2016 9:28:48 AM FINAL ATTEMPT MADE - message left Yes Markus Daft, RN, Sherre Poot

## 2016-09-09 NOTE — ED Notes (Signed)
Clean catch urine collected..

## 2016-09-09 NOTE — Discharge Instructions (Signed)
Perform the calf stretches as demonstrated. Do this especially before going to bed at night, in the morning, after awakening and during the day. Apply heat to the calf muscles occasionally. You can try quinine water prior to going to bed. Follow-up with your primary care doctor next month. Her lab work was completely normal today and your vital signs were normal.

## 2016-09-09 NOTE — ED Triage Notes (Signed)
Patient says she has had legs and bilateral hand cramping for 2 weeks.  Patient says she has done this before when potassium was low  Says pcp did not have an opening today and told to go elsewhere

## 2016-09-11 NOTE — Telephone Encounter (Signed)
Pt states she is still having lightheadedness, but otherwise feels fine. She states went to [facility] 2 days ago, and was told her symptoms were probably related to her other health issues, and that her labs were normal. Please advise, should she f/u with you, if so when?

## 2016-09-11 NOTE — Telephone Encounter (Signed)
Pt notified, she verbalized understanding.

## 2016-09-11 NOTE — Telephone Encounter (Signed)
Reviewed [facility] note and labs - reassuringly ok. Would continue calf stretches and f/u if no better.

## 2016-09-17 ENCOUNTER — Telehealth (HOSPITAL_COMMUNITY): Payer: Self-pay | Admitting: *Deleted

## 2016-09-17 NOTE — Telephone Encounter (Signed)
SPoke w/ Vira Agar @ the [facility]. ?? Was concerning about a end date.  They wanted to know if it was okay to remove to fill the RX. OK'd removal of end date

## 2016-09-17 NOTE — Telephone Encounter (Signed)
voice message from pharmacy, need someone to call them, they have questions regarding directions for  patient's Ambien.  Reference # is [id number].

## 2016-09-19 ENCOUNTER — Ambulatory Visit: Payer: Medicare Other | Admitting: Rheumatology

## 2016-09-20 ENCOUNTER — Ambulatory Visit (INDEPENDENT_AMBULATORY_CARE_PROVIDER_SITE_OTHER): Payer: Medicare Other

## 2016-09-20 ENCOUNTER — Encounter: Payer: Self-pay | Admitting: Physician Assistant

## 2016-09-20 ENCOUNTER — Ambulatory Visit: Payer: Self-pay | Admitting: Internal Medicine

## 2016-09-20 ENCOUNTER — Ambulatory Visit (INDEPENDENT_AMBULATORY_CARE_PROVIDER_SITE_OTHER): Payer: Medicare Other | Admitting: Physician Assistant

## 2016-09-20 ENCOUNTER — Telehealth: Payer: Self-pay | Admitting: Physician Assistant

## 2016-09-20 ENCOUNTER — Ambulatory Visit (INDEPENDENT_AMBULATORY_CARE_PROVIDER_SITE_OTHER)
Admission: RE | Admit: 2016-09-20 | Discharge: 2016-09-20 | Disposition: A | Discharge status: Home / Self Care | Payer: Medicare Other | Source: Ambulatory Visit | Attending: Physician Assistant | Admitting: Physician Assistant

## 2016-09-20 VITALS — BP 130/70 | HR 77 | Temp 99.0°F | Ht 61.0 in | Wt 183.0 lb

## 2016-09-20 DIAGNOSIS — J069 Acute upper respiratory infection, unspecified: Secondary | ICD-10-CM | POA: Diagnosis not present

## 2016-09-20 DIAGNOSIS — R05 Cough: Secondary | ICD-10-CM | POA: Diagnosis not present

## 2016-09-20 DIAGNOSIS — J811 Chronic pulmonary edema: Secondary | ICD-10-CM | POA: Diagnosis not present

## 2016-09-20 DIAGNOSIS — R0602 Shortness of breath: Secondary | ICD-10-CM | POA: Diagnosis not present

## 2016-09-20 MED ORDER — BENZONATATE 100 MG PO CAPS: 100.0000 mg | ORAL_CAPSULE | Freq: Two times a day (BID) | ORAL | 0 refills | Status: DC | PRN

## 2016-09-20 MED ORDER — IOPAMIDOL (ISOVUE-370) INJECTION 76%
80.0000 mL | Freq: Once | INTRAVENOUS | Status: AC | PRN
  Administered 2016-09-20: 80 mL via INTRAVENOUS

## 2016-09-20 MED ORDER — DOXYCYCLINE HYCLATE 100 MG PO TABS: 100.0000 mg | ORAL_TABLET | Freq: Two times a day (BID) | ORAL | 0 refills | Status: DC

## 2016-09-20 NOTE — Progress Notes (Signed)
Alexandria Leach is a 66 y.o. female here for cough and congestion.  I acted as a Education administrator for [employer], PA-C Anselmo Pickler, LPN  History of Present Illness:   Chief Complaint  Patient presents with  . Cough  . Chest congestion  . Fever   She has a pertinent hx of PNA (2013), lung nodule (2014), PE x 2 (2001 and 2006), DVT (2012) and TIA. She is currently on ASA.  Cough  This is a new problem. The current episode started yesterday. The problem has been unchanged. The problem occurs every few hours. Cough characteristics: Productive clear sputum. Associated symptoms include a fever, myalgias and wheezing. Pertinent negatives include no chest pain, chills, ear congestion, ear pain, headaches, heartburn, nasal congestion, postnasal drip, sore throat or shortness of breath. The symptoms are aggravated by lying down. She has tried nothing for the symptoms. Her past medical history is significant for bronchitis and pneumonia.  Fever   This is a new problem. The current episode started yesterday. The maximum temperature noted was 100 to 100.9 F (Last night, did not check this morning). The temperature was taken using an oral thermometer. Associated symptoms include congestion, coughing, muscle aches, sleepiness and wheezing. Pertinent negatives include no abdominal pain, chest pain, diarrhea, ear pain, headaches, nausea, sore throat, urinary pain or vomiting. She has tried fluids for the symptoms.  Risk factors: no recent travel and no sick contacts    Does have hx of DVTs but doesn't feel like this is that. No prolonged car rides. Does have SOB with activity x 1 week. Clear phlegm with cough. No SOB with laying down or PND. Appetite is normal. Has not taken anything for her symptoms.  Of note: she went Urgent Care on 09/09/16 for evaluation of bilateral leg cramps and lightheadedness. Labs were unrevealing and she was diagnosed with "muscle cramps." She states that her cramps worsened yesterday  and last night.   Past Medical History:  Diagnosis Date  . Ankle fracture, left 02/23/2015  . Ankle fracture, left 02/19/2015   from a fall  . Anxiety   . CHF (congestive heart failure) ([place])   . CKD (chronic kidney disease) stage 3, GFR 30-59 ml/min    saw nephrologist Dr Harden Mo  . DDD (degenerative disc disease), cervical   . Depression   . Fibromyalgia   . Glaucoma    s/p surgery, sees ophtho Q6 mo  . 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 History of kidney stones 2010  . History of pulmonary embolism 2001, 2006   completed coumadin courses  . History of rheumatic fever x3  . HLD (hyperlipidemia)   . HTN (hypertension)   . Insomnia   . Lung nodule 09/22/2012   RLL - 74mm, stable since 2014. Thought benign.   . Osteoarthritis    shoulders and knees, not RA per Dr Estanislado Pandy, positive ANA, positive Ro  . Osteopenia 09/18/2015   DEXA T -1.1 hip, -0.2 spine 08/2015   . Personal history of urinary calculi latest 2014  . Pneumonia 12/02/2011  . PONV (postoperative nausea and vomiting)   . Refusal of blood transfusions as patient is Jehovah's Witness   . Rheumatic heart disease 1980   s/p mitral valve repair 1980  . Sjogren's syndrome [facility])      Social History   Social History  . Marital status: Married    Spouse name: [name]  . Number of children: 4  .  Years of education: 12   Occupational History  .  Unemployed    Disability   Social History Main Topics  . Smoking status: Never Smoker  . Smokeless tobacco: Never Used  . Alcohol use No  . Drug use: No  . Sexual activity: Not Currently    Birth control/ protection: Surgical   Other Topics Concern  . Not on file   Social History Narrative   Lives with son, 1 dog   Occupation: unemployed, on disability for fibromyalgia since 2008.   Edu: HS   Religion: Jehova's witness   Activity: volunteers at senior center   Diet: some water, fruits/vegetables  daily   No caffeine use    Past Surgical History:  Procedure Laterality Date  . BREAST BIOPSY Right 2006   benign  . CHOLECYSTECTOMY  11/27/2011   Procedure: LAPAROSCOPIC CHOLECYSTECTOMY WITH INTRAOPERATIVE CHOLANGIOGRAM;  Surgeon: Adin Hector, MD;  Location: [place];  Service: General;  Laterality: N/A;  laparoscopic cholecystectomy with choleangiogram umbilical hernia repair  . COLONOSCOPY  07/2014   WNL Amedeo Plenty)  . dexa  08/2012   normal per patient - no records available  . MITRAL VALVE REPAIR  1980   open heart  . ORIF ANKLE FRACTURE Left 02/26/2015   Procedure: OPEN REDUCTION INTERNAL FIXATION (ORIF) LEFT TRIMALLEOLAR ANKLE FRACTURE;  Surgeon: [name], MD;  Location: [place];  Service: Orthopedics;  Laterality: Left;  . TUBAL LIGATION  1980  . UMBILICAL HERNIA REPAIR  11/27/2011   Procedure: HERNIA REPAIR UMBILICAL ADULT;  Surgeon: Adin Hector, MD;  Location: [place];  Service: General;  Laterality: N/A;  . [place]   for fibroids -- partial, ovaries remain    Family History  Problem Relation Age of Onset  . Lupus Sister        and niece  . Cancer Mother        lung (nonsmoker)  . CAD Mother        MI in her 63s  . ALS Mother   . Kidney disease Father   . Alcohol abuse Father   . Diabetes Father   . Cancer Brother        bone  . Diabetes Sister   . Stroke Sister   . Cancer Maternal Uncle        bone  . Depression Sister   . Kidney failure Other        on HD  . Diabetes Brother   . Heart attack Brother   . Stroke Maternal Grandmother     Allergies  Allergen Reactions  . Cymbalta [Duloxetine Hcl] Other (See Comments)    tachycardia  . Statins Nausea Only and Other (See Comments)    Muscle cramps also  . Sulfa Antibiotics Nausea And Vomiting    Current Medications:   Current Outpatient Prescriptions:  .  ALPRAZolam (XANAX) 1 MG tablet, Take 1 tablet (1 mg total) by mouth 3 (three) times daily as needed for anxiety., Disp: 270  tablet, Rfl: 1 .  amLODipine (NORVASC) 10 MG tablet, Take 1 tablet (10 mg total) by mouth daily., Disp: 90 tablet, Rfl: 3 .  antiseptic oral rinse (BIOTENE) LIQD, 15 mLs by Mouth Rinse route at bedtime. , Disp: , Rfl:  .  aspirin 325 MG EC tablet, Take 325 mg by mouth daily. , Disp: , Rfl:  .  cholecalciferol (VITAMIN D) 1000 UNITS tablet, Take 1,000 Units by mouth daily. , Disp: , Rfl:  .  diclofenac  sodium (VOLTAREN) 1 % GEL, Apply 1 application topically 2 (two) times daily as needed (pain)., Disp: , Rfl:  .  escitalopram (LEXAPRO) 20 MG tablet, Take 1 tablet (20 mg total) by mouth 2 (two) times daily., Disp: 180 tablet, Rfl: 2 .  ezetimibe (ZETIA) 10 MG tablet, Take 1 tablet (10 mg total) by mouth at bedtime., Disp: 90 tablet, Rfl: 3 .  famotidine (PEPCID) 20 MG tablet, TAKE 1 TABLET BY MOUTH TWO  TIMES DAILY, Disp: 180 tablet, Rfl: 3 .  furosemide (LASIX) 20 MG tablet, TAKE 1 TABLET BY MOUTH  DAILY AS NEEDED FOR EDEMA, Disp: 90 tablet, Rfl: 3 .  gabapentin (NEURONTIN) 300 MG capsule, Take 2 capsules (600 mg total) by mouth 3 (three) times daily. Take 2 capsules every morning, 1 capsule every afternoon and 2 capsules at bedtime., Disp: , Rfl:  .  hydrochlorothiazide (MICROZIDE) 12.5 MG capsule, TAKE 1 CAPSULE BY MOUTH  DAILY, Disp: 90 capsule, Rfl: 2 .  hydroxychloroquine (PLAQUENIL) 200 MG tablet, TAKE 1 TABLET BY MOUTH ONCE A DAY ON MONDAY THROUGH  FRIDAY, Disp: 65 tablet, Rfl: 0 .  lovastatin (MEVACOR) 10 MG tablet, Take 1 tablet (10 mg total) by mouth every Monday, Wednesday, and Friday., Disp: 60 tablet, Rfl: 3 .  metoprolol succinate (TOPROL-XL) 50 MG 24 hr tablet, Take 1 tablet by mouth 2 (two) times daily., Disp: , Rfl:  .  pilocarpine (SALAGEN) 5 MG tablet, Take 1 tablet (5 mg total) by mouth 3 (three) times daily as needed., Disp: 270 tablet, Rfl: 1 .  Polyethyl Glycol-Propyl Glycol (SYSTANE) 0.4-0.3 % GEL, Place 1 drop into both eyes 2 (two) times daily. , Disp: , Rfl:  .  RESTASIS  0.05 % ophthalmic emulsion, INSTILL ONE DROP IN BOTH  EYES TWO TIMES DAILY, Disp: 90 each, Rfl: 3 .  traMADol (ULTRAM) 50 MG tablet, Take 1 tablet (50 mg total) by mouth every 12 (twelve) hours as needed for severe pain., Disp: 30 tablet, Rfl: 3 .  zolpidem (AMBIEN) 10 MG tablet, Take 1 tablet (10 mg total) by mouth at bedtime as needed for sleep., Disp: 30 tablet, Rfl: 2 .  benzonatate (TESSALON) 100 MG capsule, Take 1 capsule (100 mg total) by mouth 2 (two) times daily as needed for cough., Disp: 20 capsule, Rfl: 0   Review of Systems:   Review of Systems  Constitutional: Positive for fever. Negative for chills.  HENT: Positive for congestion. Negative for ear pain, postnasal drip and sore throat.   Respiratory: Positive for cough and wheezing. Negative for shortness of breath.   Cardiovascular: Negative for chest pain.  Gastrointestinal: Negative for abdominal pain, diarrhea, heartburn, nausea and vomiting.  Genitourinary: Negative for dysuria.  Musculoskeletal: Positive for myalgias.       Bilateral lower leg cramps  Neurological: Negative for headaches.    Vitals:   Vitals:   09/20/16 1100  BP: 130/70  Pulse: 77  Temp: 99 F (37.2 C)  TempSrc: Oral  SpO2: 96%  Weight: 183 lb (83 kg)  Height: 5\' 1"  (1.549 m)     Body mass index is 34.58 kg/m.  Physical Exam:   Physical Exam  Constitutional: She appears well-developed. She is cooperative.  Non-toxic appearance. She does not have a sickly appearance. She does not appear ill. No distress.  HENT:  Head: Normocephalic and atraumatic.  Right Ear: Tympanic membrane, external ear and ear canal normal. Tympanic membrane is not erythematous, not retracted and not bulging.  Left Ear: Tympanic membrane, external  ear and ear canal normal. Tympanic membrane is not erythematous, not retracted and not bulging.  Nose: Mucosal edema and rhinorrhea present. Right sinus exhibits no maxillary sinus tenderness and no frontal sinus  tenderness. Left sinus exhibits no maxillary sinus tenderness and no frontal sinus tenderness.  Mouth/Throat: Uvula is midline. No posterior oropharyngeal edema or posterior oropharyngeal erythema.  Eyes: Conjunctivae and lids are normal.  Neck: Trachea normal.  Cardiovascular: Normal rate, regular rhythm, S1 normal, S2 normal, normal heart sounds and normal pulses.   No LE edema  Pulmonary/Chest: Effort normal. No tachypnea. She has decreased breath sounds in the right lower field and the left lower field. She has no wheezes. She has no rhonchi. She has no rales.  Musculoskeletal:  Negative Homan's sign bilaterally, calves measuring equally at 14.5inches bilaterally, no tenderness, warmth, redness; unable to reproduce symptoms with palpation  Lymphadenopathy:    She has no cervical adenopathy.  Neurological: She is alert.  Skin: Skin is warm, dry and intact.  Psychiatric: She has a normal mood and affect. Her speech is normal and behavior is normal.  Nursing note and vitals reviewed.  Ambulatory pulse ox approaching 92-93%  CLINICAL DATA:  Cough and shortness of breath since yesterday.  EXAM: CHEST  2 VIEW  COMPARISON:  June 03, 2014  FINDINGS: Patient status post prior median sternotomy. The heart size is mildly enlarged. There is increased pulmonary interstitium bilaterally. There is no focal pneumonia or pleural effusion. Soft tissue and osseous structures are stable. Patient post prior cholecystectomy.  IMPRESSION: Mild pulmonary edema.  No focal pneumonia.  EXAM: CT ANGIOGRAPHY CHEST WITH CONTRAST  TECHNIQUE: Multidetector CT imaging of the chest was performed using the standard protocol during bolus administration of intravenous contrast. Multiplanar CT image reconstructions and MIPs were obtained to evaluate the vascular anatomy.  CONTRAST: 80 cc Isovue 370 IV  COMPARISON: 12/14/2014  FINDINGS: Cardiovascular: Atherosclerotic calcification aorta. Aorta  normal caliber without aneurysm or dissection. Pulmonary arteries well opacified and patent. No evidence of pulmonary embolism. No pericardial effusion.  Mediastinum/Nodes: Esophagus unremarkable. Base of cervical region normal appearance.1 no thoracic adenopathy.  Lungs/Pleura: 6 mm nodular focus RIGHT lower lobe image 46 unchanged. Lingular opacity seen on the previous exam resolved. Minimal dependent atelectasis. Lungs otherwise clear. No acute infiltrate, pleural effusion or pneumothorax.  Upper Abdomen: Gallbladder surgically absent. Remaining visualized upper abdomen unremarkable.  Musculoskeletal: Prior median sternotomy. No acute osseous findings.  Review of the MIP images confirms the above findings.  IMPRESSION: No evidence of pulmonary embolism.  Stable 6 mm nodular focus RIGHT lower lobe with resolution of previously identified focal opacities in the lingula.  Aortic Atherosclerosis (ICD10-I70.0).  Assessment and Plan:    Kei was seen today for cough, chest congestion and fever.  Diagnoses and all orders for this visit:  Upper respiratory tract infection, unspecified type Chest xray shows mild pulmonary edema. Discussed case with Dr. Briscoe Deutscher -- given patient's significant cardiac history, including PE's and DVT as well as low oxygen saturation, will obtain stat CT. Stat CT angio does not show PE. Will have patient start 20 mg lasix daily x 3 days, call Dr. Danise Mina and make an appointment for next week to follow-up on symptoms. Also, please weigh self daily and if weight is >3lb overnight, call cardiologist or PCP office. Doxycycline per orders for URI. Tessalon for cough. I advised patient that if she develops any severe SOB or chest pain, or other concerning symptoms, I would like for her to go to  the ER. -     DG Chest 2 View; Future -     CT ANGIO CHEST PE W OR WO CONTRAST; Future  Other orders -     benzonatate (TESSALON) 100 MG capsule; Take 1  capsule (100 mg total) by mouth 2 (two) times daily as needed for cough.   . Reviewed expectations re: course of current medical issues. . Discussed self-management of symptoms. . Outlined signs and symptoms indicating need for more acute intervention. . Patient verbalized understanding and all questions were answered. . See orders for this visit as documented in the electronic medical record. . Patient received an After-Visit Summary.  CMA or LPN served as scribe during this visit. History, Physical, and Plan performed by medical provider. Documentation and orders reviewed and attested to.  Inda Coke, PA-C

## 2016-09-20 NOTE — Telephone Encounter (Signed)
Stacy from CT called to advise that the patient's results for her PE were negative. The patient is still at the office until hearing back from [place], [place]. Please advise.

## 2016-09-20 NOTE — Patient Instructions (Addendum)
It was great to meet you!  We will call you after you xray has been read. May use the [employer]  [place] to 3rd floor Building says "Heart Care"  Be there at 1:45pm TODAY  No solid foods, but you may drink liquids prior to your scan.  If you develop worsening between now and then, go to the ER!

## 2016-09-20 NOTE — Telephone Encounter (Signed)
Called Alexandria Leach back at [facility] and spoke to pt, told her CT scan was negative. Pt verbalized understanding. Samantha sent a Rx to the pharmacy for Doxycycline 100 mg twice a day x 10 days for URI. Also she would like for you to take your Lasix 20 mg daily x 3 days and weigh yourself every morning prior if you gain 3 or more pounds call the office. Pt verbalized understanding. Told pt to follow up with Dr. Darnell Level next week per [place]. Pt verbalized understanding.

## 2016-09-24 ENCOUNTER — Ambulatory Visit (INDEPENDENT_AMBULATORY_CARE_PROVIDER_SITE_OTHER): Payer: Medicare Other | Admitting: Family Medicine

## 2016-09-24 ENCOUNTER — Encounter: Payer: Self-pay | Admitting: Family Medicine

## 2016-09-24 VITALS — BP 132/84 | HR 82 | Temp 98.8°F | Ht 61.0 in | Wt 178.0 lb

## 2016-09-24 DIAGNOSIS — R911 Solitary pulmonary nodule: Secondary | ICD-10-CM

## 2016-09-24 DIAGNOSIS — J209 Acute bronchitis, unspecified: Secondary | ICD-10-CM | POA: Diagnosis not present

## 2016-09-24 DIAGNOSIS — M797 Fibromyalgia: Secondary | ICD-10-CM | POA: Diagnosis not present

## 2016-09-24 NOTE — Progress Notes (Signed)
BP 132/84   Pulse 82   Temp 98.8 F (37.1 C)   Ht 5\' 1"  (1.549 m)   Wt 178 lb (80.7 kg)   SpO2 95% Comment: RA  BMI 33.63 kg/m    CC: f/u visit Subjective:    Patient ID: Alexandria Leach, female    DOB: April 24, 1950, 66 y.o.   MRN: [id number]  HPI: Alexandria Leach is a 66 y.o. female presenting on 09/24/2016 for Follow-up   Seen initially at [facility] for leg cramping and lightheadedness with unrevealing evaluation. Seen again last week at [facility] office with cough, fever. Given h/o PE and DVT, CT angio was obtained which was negative for PE. She was placed on lasix 20mg  daily for 3 days as well as doxycycline and tessalon Rx. She does endorse good UOP effect with lasix.   Today endorses increase in chronic leg pain attributed to FM as well as ongoing fatigue. She also noticed small lump behind L ear which is painful along with L tooth pain.   Persistent dry cough, but improving. Fever has improved. Wheezing has improved.   Relevant past medical, surgical, family and social history reviewed and updated as indicated. Interim medical history since our last visit reviewed. Allergies and medications reviewed and updated. Outpatient Medications Prior to Visit  Medication Sig Dispense Refill  . ALPRAZolam (XANAX) 1 MG tablet Take 1 tablet (1 mg total) by mouth 3 (three) times daily as needed for anxiety. 270 tablet 1  . amLODipine (NORVASC) 10 MG tablet Take 1 tablet (10 mg total) by mouth daily. 90 tablet 3  . antiseptic oral rinse (BIOTENE) LIQD 15 mLs by Mouth Rinse route at bedtime.     Marland Kitchen aspirin 325 MG EC tablet Take 325 mg by mouth daily.     . benzonatate (TESSALON) 100 MG capsule Take 1 capsule (100 mg total) by mouth 2 (two) times daily as needed for cough. 20 capsule 0  . cholecalciferol (VITAMIN D) 1000 UNITS tablet Take 1,000 Units by mouth daily.     . diclofenac sodium (VOLTAREN) 1 % GEL Apply 1 application topically 2 (two) times daily as needed (pain).    Marland Kitchen doxycycline  (VIBRA-TABS) 100 MG tablet Take 1 tablet (100 mg total) by mouth 2 (two) times daily. 20 tablet 0  . escitalopram (LEXAPRO) 20 MG tablet Take 1 tablet (20 mg total) by mouth 2 (two) times daily. 180 tablet 2  . ezetimibe (ZETIA) 10 MG tablet Take 1 tablet (10 mg total) by mouth at bedtime. 90 tablet 3  . famotidine (PEPCID) 20 MG tablet TAKE 1 TABLET BY MOUTH TWO  TIMES DAILY 180 tablet 3  . furosemide (LASIX) 20 MG tablet TAKE 1 TABLET BY MOUTH  DAILY AS NEEDED FOR EDEMA 90 tablet 3  . gabapentin (NEURONTIN) 300 MG capsule Take 2 capsules (600 mg total) by mouth 3 (three) times daily. Take 2 capsules every morning, 1 capsule every afternoon and 2 capsules at bedtime.    . hydrochlorothiazide (MICROZIDE) 12.5 MG capsule TAKE 1 CAPSULE BY MOUTH  DAILY 90 capsule 2  . hydroxychloroquine (PLAQUENIL) 200 MG tablet TAKE 1 TABLET BY MOUTH ONCE A DAY ON MONDAY THROUGH  FRIDAY 65 tablet 0  . lovastatin (MEVACOR) 10 MG tablet Take 1 tablet (10 mg total) by mouth every Monday, Wednesday, and Friday. 60 tablet 3  . metoprolol succinate (TOPROL-XL) 50 MG 24 hr tablet Take 1 tablet by mouth 2 (two) times daily.    . pilocarpine (SALAGEN) 5  MG tablet Take 1 tablet (5 mg total) by mouth 3 (three) times daily as needed. 270 tablet 1  . Polyethyl Glycol-Propyl Glycol (SYSTANE) 0.4-0.3 % GEL Place 1 drop into both eyes 2 (two) times daily.     . RESTASIS 0.05 % ophthalmic emulsion INSTILL ONE DROP IN BOTH  EYES TWO TIMES DAILY 90 each 3  . traMADol (ULTRAM) 50 MG tablet Take 1 tablet (50 mg total) by mouth every 12 (twelve) hours as needed for severe pain. 30 tablet 3  . zolpidem (AMBIEN) 10 MG tablet Take 1 tablet (10 mg total) by mouth at bedtime as needed for sleep. 30 tablet 2   No facility-administered medications prior to visit.      Per HPI unless specifically indicated in ROS section below Review of Systems     Objective:    BP 132/84   Pulse 82   Temp 98.8 F (37.1 C)   Ht 5\' 1"  (1.549 m)   Wt  178 lb (80.7 kg)   SpO2 95% Comment: RA  BMI 33.63 kg/m   Wt Readings from Last 3 Encounters:  09/24/16 178 lb (80.7 kg)  09/20/16 183 lb (83 kg)  07/15/16 174 lb (78.9 kg)    Physical Exam  Constitutional: She appears well-developed and well-nourished. No distress.  HENT:  Head: Normocephalic and atraumatic.  Right Ear: Hearing, tympanic membrane, external ear and ear canal normal.  Left Ear: Hearing, tympanic membrane, external ear and ear canal normal.  Nose: No mucosal edema or rhinorrhea. Right sinus exhibits no maxillary sinus tenderness and no frontal sinus tenderness. Left sinus exhibits no maxillary sinus tenderness and no frontal sinus tenderness.  Mouth/Throat: Uvula is midline, oropharynx is clear and moist and mucous membranes are normal. No oropharyngeal exudate, posterior oropharyngeal edema, posterior oropharyngeal erythema or tonsillar abscesses.  Eyes: Conjunctivae and EOM are normal. Pupils are equal, round, and reactive to light. No scleral icterus.  Neck: Normal range of motion. Neck supple.  Cardiovascular: Normal rate, regular rhythm, normal heart sounds and intact distal pulses.   No murmur heard. Pulmonary/Chest: Effort normal and breath sounds normal. No respiratory distress. She has no wheezes. She has no rales.  Musculoskeletal: She exhibits no edema.  Lymphadenopathy:       Head (right side): No submental, no submandibular, no tonsillar, no preauricular, no posterior auricular and no occipital adenopathy present.       Head (left side): Posterior auricular adenopathy present. No submental, no submandibular, no tonsillar, no preauricular and no occipital adenopathy present.    She has no cervical adenopathy.  Small post auricular LAD on left sub cm  Skin: Skin is warm and dry. No rash noted.  Nursing note and vitals reviewed.  Results for orders placed or performed during the hospital encounter of 09/09/16  I-STAT, chem 8  Result Value Ref Range   Sodium  141 135 - 145 mmol/L   Potassium 4.2 3.5 - 5.1 mmol/L   Chloride 103 101 - 111 mmol/L   BUN 10 6 - 20 mg/dL   Creatinine, Ser 1.00 0.44 - 1.00 mg/dL   Glucose, Bld 86 65 - 99 mg/dL   Calcium, Ion 1.28 1.15 - 1.40 mmol/L   TCO2 28 0 - 100 mmol/L   Hemoglobin 13.9 12.0 - 15.0 g/dL   HCT 41.0 36.0 - 46.0 %   CT ANGIOGRAPHY CHEST WITH CONTRAST TECHNIQUE: Multidetector CT imaging of the chest was performed using the standard protocol during bolus administration of intravenous contrast. Multiplanar CT  image reconstructions and MIPs were obtained to evaluate the vascular anatomy. CONTRAST:  80 cc Isovue 370 IV COMPARISON:  12/14/2014 FINDINGS: Cardiovascular: Atherosclerotic calcification aorta. Aorta normal caliber without aneurysm or dissection. Pulmonary arteries well opacified and patent. No evidence of pulmonary embolism. No pericardial effusion. Mediastinum/Nodes: Esophagus unremarkable. Base of cervical region normal appearance.1 no thoracic adenopathy. Lungs/Pleura: 6 mm nodular focus RIGHT lower lobe image 46 unchanged. Lingular opacity seen on the previous exam resolved. Minimal dependent atelectasis. Lungs otherwise clear. No acute infiltrate, pleural effusion or pneumothorax. Upper Abdomen: Gallbladder surgically absent. Remaining visualized upper abdomen unremarkable. Musculoskeletal: Prior median sternotomy. No acute osseous findings. Review of the MIP images confirms the above findings. IMPRESSION: No evidence of pulmonary embolism. Stable 6 mm nodular focus RIGHT lower lobe with resolution of previously identified focal opacities in the lingula. Aortic Atherosclerosis (ICD10-I70.0). Electronically Signed   By: Lavonia Dana M.D.   On: 09/20/2016 13:59    Assessment & Plan:   Problem List Items Addressed This Visit    Acute bronchitis - Primary    Recovering from recent acute bronchitis, treated with doxycycline and tessalon perls. Reviewed recent reassuring CTA.  Discussed anticipated course of resolution. Update if not improving with treatment as expected.      Fibromyalgia    Ongoing, worsening. Reviewed tramadol use. Prescribed by rheum.       Lung nodule       Follow up plan: Return if symptoms worsen or fail to improve.  Ria Bush, MD

## 2016-09-24 NOTE — Assessment & Plan Note (Signed)
Recovering from recent acute bronchitis, treated with doxycycline and tessalon perls. Reviewed recent reassuring CTA. Discussed anticipated course of resolution. Update if not improving with treatment as expected.

## 2016-09-24 NOTE — Assessment & Plan Note (Signed)
Ongoing, worsening. Reviewed tramadol use. Prescribed by rheum.

## 2016-09-24 NOTE — Patient Instructions (Addendum)
I do think you had bronchitis - currently treating with doxycycline. Push fluids and rest.  Let us know if not improving as expected. Ok to change lasix back to as needed only.  Consider trial vitamin E over the counter once daily to see if it will help with cramping.

## 2016-09-26 ENCOUNTER — Other Ambulatory Visit: Payer: Self-pay | Admitting: Radiology

## 2016-09-26 DIAGNOSIS — Z79899 Other long term (current) drug therapy: Secondary | ICD-10-CM

## 2016-09-26 LAB — CBC WITH DIFFERENTIAL/PLATELET
Basophils Absolute: 0 cells/uL (ref 0–200)
Basophils Relative: 0 %
EOS ABS: 260 {cells}/uL (ref 15–500)
Eosinophils Relative: 5 %
HEMATOCRIT: 36.8 % (ref 35.0–45.0)
Hemoglobin: 11.8 g/dL (ref 11.7–15.5)
LYMPHS PCT: 32 %
Lymphs Abs: 1664 cells/uL (ref 850–3900)
MCH: 27.3 pg (ref 27.0–33.0)
MCHC: 32.1 g/dL (ref 32.0–36.0)
MCV: 85 fL (ref 80.0–100.0)
MONO ABS: 416 {cells}/uL (ref 200–950)
MPV: 10.2 fL (ref 7.5–12.5)
Monocytes Relative: 8 %
NEUTROS PCT: 55 %
Neutro Abs: 2860 cells/uL (ref 1500–7800)
Platelets: 232 10*3/uL (ref 140–400)
RBC: 4.33 MIL/uL (ref 3.80–5.10)
RDW: 14.7 % (ref 11.0–15.0)
WBC: 5.2 10*3/uL (ref 3.8–10.8)

## 2016-09-26 LAB — COMPLETE METABOLIC PANEL WITH GFR
ALT: 41 U/L — ABNORMAL HIGH (ref 6–29)
AST: 38 U/L — ABNORMAL HIGH (ref 10–35)
Albumin: 3.8 g/dL (ref 3.6–5.1)
Alkaline Phosphatase: 73 U/L (ref 33–130)
BUN: 12 mg/dL (ref 7–25)
CALCIUM: 9.2 mg/dL (ref 8.6–10.4)
CHLORIDE: 106 mmol/L (ref 98–110)
CO2: 25 mmol/L (ref 20–31)
Creat: 1.13 mg/dL — ABNORMAL HIGH (ref 0.50–0.99)
GFR, EST AFRICAN AMERICAN: 59 mL/min — AB (ref >=60)
GFR, Est Non African American: 51 mL/min — ABNORMAL LOW (ref >=60)
Glucose, Bld: 110 mg/dL — ABNORMAL HIGH (ref 65–99)
POTASSIUM: 3.8 mmol/L (ref 3.5–5.3)
Sodium: 140 mmol/L (ref 135–146)
Total Bilirubin: 0.4 mg/dL (ref 0.2–1.2)
Total Protein: 6.5 g/dL (ref 6.1–8.1)

## 2016-09-27 NOTE — Progress Notes (Signed)
Mild elevation of LFTs. Please advise patient not to take any NSAIDs and use Voltaren gel only sparingly. We'll follow her labs.

## 2016-10-16 ENCOUNTER — Other Ambulatory Visit: Payer: Self-pay

## 2016-10-16 ENCOUNTER — Other Ambulatory Visit: Payer: Self-pay | Admitting: Rheumatology

## 2016-10-16 DIAGNOSIS — Z5181 Encounter for therapeutic drug level monitoring: Secondary | ICD-10-CM

## 2016-10-16 MED ORDER — TRAMADOL HCL 50 MG PO TABS: 50.0000 mg | ORAL_TABLET | Freq: Two times a day (BID) | ORAL | 0 refills | Status: DC | PRN

## 2016-10-16 NOTE — Telephone Encounter (Signed)
Patient is requesting refill of tramadol to be sent to [facility].

## 2016-10-16 NOTE — Telephone Encounter (Signed)
Last Visit: 07/15/16 Next Visit: 11/14/16 UDS: 04/2015  Narc Agreement: 08/08/15 Patient coming to update today.   Okay to refill 30 day supply Tramadol?

## 2016-10-16 NOTE — Telephone Encounter (Signed)
ok 

## 2016-10-17 ENCOUNTER — Other Ambulatory Visit: Payer: Self-pay | Admitting: Radiology

## 2016-10-21 LAB — PAIN MGMT, TRAMADOL W/MEDMATCH, U
DESMETHYLTRAMADOL: 3145 ng/mL — AB (ref <100)
Tramadol: 8814 ng/mL — ABNORMAL HIGH (ref <100)

## 2016-10-22 ENCOUNTER — Telehealth: Payer: Self-pay | Admitting: Rheumatology

## 2016-10-22 LAB — PAIN MGMT, PROFILE 5 W/CONF, U
ALPHAHYDROXYALPRAZOLAM: 4868 ng/mL — AB (ref <25)
ALPHAHYDROXYMIDAZOLAM: NEGATIVE ng/mL (ref <50)
AMPHETAMINES: NEGATIVE ng/mL (ref <500)
Alphahydroxytriazolam: NEGATIVE ng/mL (ref <50)
Aminoclonazepam: NEGATIVE ng/mL (ref <25)
BARBITURATES: NEGATIVE ng/mL (ref <300)
Benzodiazepines: POSITIVE ng/mL — AB (ref <100)
Cocaine Metabolite: NEGATIVE ng/mL (ref <150)
Creatinine: 286.4 mg/dL (ref >=20.0)
HYDROXYETHYLFLURAZEPAM: NEGATIVE ng/mL (ref <50)
Lorazepam: NEGATIVE ng/mL (ref <50)
MARIJUANA METABOLITE: NEGATIVE ng/mL (ref <20)
METHADONE METABOLITE: NEGATIVE ng/mL (ref <100)
NORDIAZEPAM: NEGATIVE ng/mL (ref <50)
OXIDANT: NEGATIVE ug/mL (ref <200)
Opiates: NEGATIVE ng/mL (ref <100)
Oxazepam: NEGATIVE ng/mL (ref <50)
Oxycodone: NEGATIVE ng/mL (ref <100)
PH: 7 (ref 4.5–9.0)
Temazepam: NEGATIVE ng/mL (ref <50)

## 2016-10-22 NOTE — Telephone Encounter (Signed)
Patient calling for UA results. Patient concerned because all the swelling with legs, and feet she is having. Request a call back today if possible.

## 2016-10-22 NOTE — Telephone Encounter (Signed)
Patient advised her that it was a UDS that was order and the results were not back. Patient advised to contact PCP about swelling in legs and feet.

## 2016-10-22 NOTE — Progress Notes (Signed)
C/w

## 2016-10-29 ENCOUNTER — Ambulatory Visit: Payer: Self-pay | Admitting: Family Medicine

## 2016-10-29 ENCOUNTER — Encounter: Payer: Self-pay | Admitting: Family Medicine

## 2016-10-29 ENCOUNTER — Ambulatory Visit (INDEPENDENT_AMBULATORY_CARE_PROVIDER_SITE_OTHER): Payer: Medicare Other | Admitting: Family Medicine

## 2016-10-29 VITALS — BP 130/76 | HR 75 | Temp 98.4°F | Wt 179.0 lb

## 2016-10-29 DIAGNOSIS — K1120 Sialoadenitis, unspecified: Secondary | ICD-10-CM | POA: Insufficient documentation

## 2016-10-29 DIAGNOSIS — Z79899 Other long term (current) drug therapy: Secondary | ICD-10-CM | POA: Diagnosis not present

## 2016-10-29 DIAGNOSIS — M3500 Sicca syndrome, unspecified: Secondary | ICD-10-CM | POA: Diagnosis not present

## 2016-10-29 DIAGNOSIS — R590 Localized enlarged lymph nodes: Secondary | ICD-10-CM

## 2016-10-29 MED ORDER — LOVASTATIN 10 MG PO TABS: 10.0000 mg | ORAL_TABLET | ORAL | 1 refills | Status: DC

## 2016-10-29 MED ORDER — EZETIMIBE 10 MG PO TABS: 10.0000 mg | ORAL_TABLET | Freq: Every day | ORAL | 1 refills | Status: DC

## 2016-10-29 NOTE — Progress Notes (Signed)
BP 130/76   Pulse 75   Temp 98.4 F (36.9 C)   Wt 179 lb (81.2 kg)   SpO2 97%   BMI 33.82 kg/m    CC: check behind L ear Subjective:    Patient ID: Alexandria Leach, female    DOB: [birth year], 66 y.o.   MRN: [id number]  HPI: Alexandria Leach is a 66 y.o. female presenting on 10/29/2016 for Follow-up (Lymph Node Behind Ear-- STILL VERY PAINFUL-- IT HURTS)   See prior note for details. Seen here 09/24/2016 for f/u bronchitis, treated with doxycycline course as well as lasix course. Cough, fever, wheezing has improved.   At that visit, L postauricular tender swelling noted as well as tooth pain - this has persisted. Noticing more pain with left sided headache worse at night when laying on that side.   Denies fever, night sweats, unexpected weight loss. No ST.   Has not recently seen dentist (over a year ago). She did have root canal to left upper canine 1 yr ago - ongoing pain since then.   Known sjogren syndrome - takes pilocarpine for dryness but it causes significant sweating so she only takes once daily.   Relevant past medical, surgical, family and social history reviewed and updated as indicated. Interim medical history since our last visit reviewed. Allergies and medications reviewed and updated. Outpatient Medications Prior to Visit  Medication Sig Dispense Refill  . ALPRAZolam (XANAX) 1 MG tablet Take 1 tablet (1 mg total) by mouth 3 (three) times daily as needed for anxiety. 270 tablet 1  . amLODipine (NORVASC) 10 MG tablet Take 1 tablet (10 mg total) by mouth daily. 90 tablet 3  . antiseptic oral rinse (BIOTENE) LIQD 15 mLs by Mouth Rinse route at bedtime.     Marland Kitchen aspirin 325 MG EC tablet Take 325 mg by mouth daily.     . cholecalciferol (VITAMIN D) 1000 UNITS tablet Take 1,000 Units by mouth daily.     . diclofenac sodium (VOLTAREN) 1 % GEL Apply 1 application topically 2 (two) times daily as needed (pain).    Marland Kitchen escitalopram (LEXAPRO) 20 MG tablet Take 1 tablet (20 mg  total) by mouth 2 (two) times daily. 180 tablet 2  . famotidine (PEPCID) 20 MG tablet TAKE 1 TABLET BY MOUTH TWO  TIMES DAILY 180 tablet 3  . furosemide (LASIX) 20 MG tablet TAKE 1 TABLET BY MOUTH  DAILY AS NEEDED FOR EDEMA 90 tablet 3  . gabapentin (NEURONTIN) 300 MG capsule Take 2 capsules (600 mg total) by mouth 3 (three) times daily. Take 2 capsules every morning, 1 capsule every afternoon and 2 capsules at bedtime.    . hydrochlorothiazide (MICROZIDE) 12.5 MG capsule TAKE 1 CAPSULE BY MOUTH  DAILY 90 capsule 2  . hydroxychloroquine (PLAQUENIL) 200 MG tablet TAKE 1 TABLET BY MOUTH ONCE A DAY ON MONDAY THROUGH  FRIDAY 65 tablet 0  . metoprolol succinate (TOPROL-XL) 50 MG 24 hr tablet Take 1 tablet by mouth 2 (two) times daily.    . pilocarpine (SALAGEN) 5 MG tablet Take 1 tablet (5 mg total) by mouth 3 (three) times daily as needed. 270 tablet 1  . Polyethyl Glycol-Propyl Glycol (SYSTANE) 0.4-0.3 % GEL Place 1 drop into both eyes 2 (two) times daily.     . RESTASIS 0.05 % ophthalmic emulsion INSTILL ONE DROP IN BOTH  EYES TWO TIMES DAILY 90 each 3  . traMADol (ULTRAM) 50 MG tablet Take 1 tablet (50 mg total) by mouth  every 12 (twelve) hours as needed for severe pain. 30 tablet 0  . benzonatate (TESSALON) 100 MG capsule Take 1 capsule (100 mg total) by mouth 2 (two) times daily as needed for cough. 20 capsule 0  . doxycycline (VIBRA-TABS) 100 MG tablet Take 1 tablet (100 mg total) by mouth 2 (two) times daily. 20 tablet 0  . ezetimibe (ZETIA) 10 MG tablet Take 1 tablet (10 mg total) by mouth at bedtime. 90 tablet 3  . lovastatin (MEVACOR) 10 MG tablet Take 1 tablet (10 mg total) by mouth every Monday, Wednesday, and Friday. 60 tablet 3  . zolpidem (AMBIEN) 10 MG tablet Take 1 tablet (10 mg total) by mouth at bedtime as needed for sleep. 30 tablet 2   No facility-administered medications prior to visit.      Per HPI unless specifically indicated in ROS section below Review of Systems       Objective:    BP 130/76   Pulse 75   Temp 98.4 F (36.9 C)   Wt 179 lb (81.2 kg)   SpO2 97%   BMI 33.82 kg/m   Wt Readings from Last 3 Encounters:  10/29/16 179 lb (81.2 kg)  09/24/16 178 lb (80.7 kg)  09/20/16 183 lb (83 kg)    Physical Exam  Constitutional: She appears well-developed and well-nourished. No distress.  HENT:  Head: Normocephalic and atraumatic.  Right Ear: Hearing, tympanic membrane, external ear and ear canal normal.  Left Ear: Hearing, tympanic membrane, external ear and ear canal normal.  Nose: Nose normal. No mucosal edema or rhinorrhea.  Mouth/Throat: Uvula is midline, oropharynx is clear and moist and mucous membranes are normal. No oropharyngeal exudate, posterior oropharyngeal edema, posterior oropharyngeal erythema or tonsillar abscesses.  No jawline tenderness with palpation of mandible No parotid fullness No pain to palpation of teeth No oral lesions  Eyes: Pupils are equal, round, and reactive to light. Conjunctivae and EOM are normal. No scleral icterus.  Neck: Normal range of motion. Neck supple.  Cardiovascular: Normal rate, regular rhythm, normal heart sounds and intact distal pulses.   No murmur heard. Pulmonary/Chest: Effort normal and breath sounds normal. No respiratory distress. She has no wheezes. She has no rales.  Lymphadenopathy:       Head (right side): Submandibular (tender subcm) adenopathy present. No submental, no tonsillar, no preauricular and no posterior auricular adenopathy present.       Head (left side): No submental, no submandibular, no tonsillar, no preauricular and no posterior auricular (discomfort behind L ear) adenopathy present.    She has no cervical adenopathy.       Right: No supraclavicular adenopathy present.       Left: No supraclavicular adenopathy present.  Skin: Skin is warm and dry. No rash noted.  Nursing note and vitals reviewed.      Assessment & Plan:   Problem List Items Addressed This Visit     High risk medication use   Relevant Orders   Comprehensive metabolic panel   Lymphadenopathy of head and neck region - Primary    No true enlarged lymphadenopathy but she is very tender to palpation along left postauricular region as well as R submandibular region. ?tender lymph node (<1cm size) vs salivary gland enlargement. No parotid tenderness/swelling.  Start eval today with labwork, low threshold to check [place] vs CT vs f/u with rheum.  No further evidence of URI. No signs of pharyngitis.  I also did recommend f/u with dentist as overdue. No signs of  dental disease as cause of symptoms today.       Relevant Orders   CBC with Differential/Platelet   Sedimentation rate   Sjogren's syndrome ([place])       Follow up plan: Return if symptoms worsen or fail to improve.  Ria Bush, MD

## 2016-10-29 NOTE — Assessment & Plan Note (Addendum)
No true enlarged lymphadenopathy but she is very tender to palpation along left postauricular region as well as R submandibular region. ?tender lymph node (<1cm size) vs salivary gland enlargement. No parotid tenderness/swelling.  Start eval today with labwork, low threshold to check [place] vs CT vs f/u with rheum.  No further evidence of URI. No signs of pharyngitis.  I also did recommend f/u with dentist as overdue. No signs of dental disease as cause of symptoms today.

## 2016-10-29 NOTE — Patient Instructions (Addendum)
Labs today to recheck liver and blood counts  I'm not sure why glands are so tender - if abnormal labs, we may consider CT vs ultrasound of neck to further evaluate tender areas.  We will be in touch with results.

## 2016-10-30 ENCOUNTER — Telehealth: Payer: Self-pay | Admitting: Radiology

## 2016-10-30 LAB — COMPREHENSIVE METABOLIC PANEL
ALBUMIN: 4.2 g/dL (ref 3.5–5.2)
ALT: 14 U/L (ref 0–35)
AST: 26 U/L (ref 0–37)
Alkaline Phosphatase: 50 U/L (ref 39–117)
BILIRUBIN TOTAL: 0.4 mg/dL (ref 0.2–1.2)
BUN: 11 mg/dL (ref 6–23)
CALCIUM: 9.5 mg/dL (ref 8.4–10.5)
CHLORIDE: 104 meq/L (ref 96–112)
CO2: 23 mEq/L (ref 19–32)
CREATININE: 1.18 mg/dL (ref 0.40–1.20)
GFR: 59.26 mL/min — AB (ref >60.00)
Glucose, Bld: 140 mg/dL — ABNORMAL HIGH (ref 70–99)
Potassium: 3.6 mEq/L (ref 3.5–5.1)
Sodium: 137 mEq/L (ref 135–145)
Total Protein: 6.8 g/dL (ref 6.0–8.3)

## 2016-10-30 LAB — CBC WITH DIFFERENTIAL/PLATELET
BASOS PCT: 0.9 % (ref 0.0–3.0)
Basophils Absolute: 0 10*3/uL (ref 0.0–0.1)
EOS ABS: 0.2 10*3/uL (ref 0.0–0.7)
EOS PCT: 3.5 % (ref 0.0–5.0)
HEMATOCRIT: 38.6 % (ref 36.0–46.0)
HEMOGLOBIN: 12.6 g/dL (ref 12.0–15.0)
LYMPHS PCT: 41.2 % (ref 12.0–46.0)
Lymphs Abs: 2 10*3/uL (ref 0.7–4.0)
MCHC: 32.6 g/dL (ref 30.0–36.0)
MCV: 85.9 fl (ref 78.0–100.0)
MONOS PCT: 10.1 % (ref 3.0–12.0)
Monocytes Absolute: 0.5 10*3/uL (ref 0.1–1.0)
Neutro Abs: 2.2 10*3/uL (ref 1.4–7.7)
Neutrophils Relative %: 44.3 % (ref 43.0–77.0)
Platelets: 223 10*3/uL (ref 150.0–400.0)
RBC: 4.5 Mil/uL (ref 3.87–5.11)
RDW: 14.8 % (ref 11.5–15.5)
WBC: 4.9 10*3/uL (ref 4.0–10.5)

## 2016-10-30 LAB — SEDIMENTATION RATE: SED RATE: 21 mm/h (ref 0–30)

## 2016-10-30 NOTE — Telephone Encounter (Signed)
Refill request received via fax for tramadol, from [facility] they want to verify Rx was faxed to them., have sent them the fax to advise it was faxed from here.

## 2016-11-09 NOTE — Progress Notes (Deleted)
Office Visit Note   Patient: Alexandria Leach           Date of Birth: 1950-09-08           MRN: [id number] Visit Date: 11/14/2016              Requested by: Ria Bush, MD [address] [place], [place] [address] PCP: Ria Bush, MD   Assessment & Plan: Visit Diagnoses:  1. Sjogren's syndrome, with unspecified organ involvement ([place])   2. High risk medication use   3. DDD (degenerative disc disease), cervical   4. Osteopenia of multiple sites   5. Fibromyalgia   6. Chronic midline low back pain without sciatica   7. Other insomnia   8. Other fatigue     Plan: ***  Follow-Up Instructions: No Follow-up on file.   Orders:  No orders of the defined types were placed in this encounter.  No orders of the defined types were placed in this encounter.     Procedures: No procedures performed   Clinical Data: No additional findings.   Subjective: No chief complaint on file.   HPI  Review of Systems   Objective: Vital Signs: There were no vitals taken for this visit.  Physical Exam  Ortho Exam  Specialty Comments:  No specialty comments available.  Imaging: No results found.   PMFS History: Patient Active Problem List   Diagnosis Date Noted  . Lymphadenopathy of head and neck region 10/29/2016  . Acute bronchitis 09/24/2016  . High risk medication use 06/05/2016  . Peripheral neuropathy 01/30/2016  . DNR no code (do not resuscitate) 12/08/2015  . 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 TIA (transient ischemic attack) 10/12/2015  . Abnormal urinalysis 10/12/2015  . Mild mitral regurgitation 10/12/2015  . Osteopenia 09/18/2015  . Right arm pain 08/29/2015  . Fall   . Trimalleolar fracture of left ankle 02/23/2015  . Right sided sciatica 02/21/2015  . Imbalance 01/12/2015  . Transaminitis 12/24/2014  . DDD (degenerative disc disease), cervical   . Health maintenance examination 10/18/2014  . Advanced care  planning/counseling discussion 10/18/2014  . Medicare annual wellness visit, initial 10/18/2014  . Refusal of blood transfusions as patient is Jehovah's Witness   . Sjogren's syndrome ([place])   . CKD (chronic kidney disease) stage 3, GFR 30-59 ml/min   . Glaucoma   . Fibromyalgia   . Osteoarthritis   . History of pulmonary embolism   . Lung nodule 09/22/2012  . Diastolic CHF ([place]) 20/35/5974  . Aortic stenosis 04/22/2011  . Palpitations 08/10/2010  . Dyspnea 08/10/2010  . HOT FLASHES 06/28/2008  . Trochanteric bursitis of right hip 06/28/2008  . ARTHRALGIA 07/01/2007  . BACK PAIN, LEFT 08/25/2006  . GAD (generalized anxiety disorder) 03/28/2006  . MITRAL VALVE REPLACEMENT, HX OF 03/28/2006  . TAH/BSO, HX OF 03/28/2006  . HYPERCHOLESTEROLEMIA 12/31/2005  . MDD (major depressive disorder), recurrent episode ([place]) 12/31/2005  . RHEUMATIC HEART DISEASE 12/31/2005  . Essential hypertension 12/31/2005  . Chronic insomnia 12/31/2005   Past Medical History:  Diagnosis Date  . Ankle fracture, left 02/23/2015  . Ankle fracture, left 02/19/2015   from a fall  . Anxiety   . CHF (congestive heart failure) ([place])   . CKD (chronic kidney disease) stage 3, GFR 30-59 ml/min    saw nephrologist Dr Harden Mo  . DDD (degenerative disc disease), cervical   . Depression   . Fibromyalgia   . Glaucoma    s/p surgery, sees  ophtho Q6 mo  . 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 History of kidney stones 2010  . History of pulmonary embolism 2001, 2006   completed coumadin courses  . History of rheumatic fever x3  . HLD (hyperlipidemia)   . HTN (hypertension)   . Insomnia   . Lung nodule 09/22/2012   RLL - 108mm, stable since 2014. Thought benign.   . Osteoarthritis    shoulders and knees, not RA per Dr Estanislado Pandy, positive ANA, positive Ro  . Osteopenia 09/18/2015   DEXA T -1.1 hip, -0.2 spine 08/2015   . Personal history of urinary  calculi latest 2014  . Pneumonia 12/02/2011  . PONV (postoperative nausea and vomiting)   . Refusal of blood transfusions as patient is Jehovah's Witness   . Rheumatic heart disease 1980   s/p mitral valve repair 1980  . Sjogren's syndrome ([place])     Family History  Problem Relation Age of Onset  . Lupus Sister        and niece  . Cancer Mother        lung (nonsmoker)  . CAD Mother        MI in her 25s  . ALS Mother   . Kidney disease Father   . Alcohol abuse Father   . Diabetes Father   . Cancer Brother        bone  . Diabetes Sister   . Stroke Sister   . Cancer Maternal Uncle        bone  . Depression Sister   . Kidney failure Other        on HD  . Diabetes Brother   . Heart attack Brother   . Stroke Maternal Grandmother     Past Surgical History:  Procedure Laterality Date  . BREAST BIOPSY Right 2006   benign  . CHOLECYSTECTOMY  11/27/2011   Procedure: LAPAROSCOPIC CHOLECYSTECTOMY WITH INTRAOPERATIVE CHOLANGIOGRAM;  Surgeon: Adin Hector, MD;  Location: [facility];  Service: General;  Laterality: N/A;  laparoscopic cholecystectomy with choleangiogram umbilical hernia repair  . COLONOSCOPY  07/2014   WNL Amedeo Plenty)  . dexa  08/2012   normal per patient - no records available  . MITRAL VALVE REPAIR  1980   open heart  . ORIF ANKLE FRACTURE Left 02/26/2015   Procedure: OPEN REDUCTION INTERNAL FIXATION (ORIF) LEFT TRIMALLEOLAR ANKLE FRACTURE;  Surgeon: [name], MD;  Location: [facility];  Service: Orthopedics;  Laterality: Left;  . TUBAL LIGATION  1980  . UMBILICAL HERNIA REPAIR  11/27/2011   Procedure: HERNIA REPAIR UMBILICAL ADULT;  Surgeon: Adin Hector, MD;  Location: [facility];  Service: General;  Laterality: N/A;  . [place]   for fibroids -- partial, ovaries remain   Social History   Occupational History  .  Unemployed    Disability   Social History Main Topics  . Smoking status: Never Smoker  . Smokeless tobacco: Never Used  . Alcohol use  No  . Drug use: No  . Sexual activity: Not Currently    Birth control/ protection: Surgical

## 2016-11-09 NOTE — Progress Notes (Signed)
Office Visit Note  Patient: Alexandria Leach             Date of Birth: [birth year]           MRN: [id number]             PCP: Ria Bush, MD Referring: Ria Bush, MD Visit Date: 11/14/2016 Occupation: @GUAROCC @    Subjective:  fatigue.   History of Present Illness: Alexandria Leach is a 66 y.o. female with history of Sjogren's disc disease and fibromyalgia syndrome. She states she's been experiencing fatigue. She does not have much discomfort in her C-spine. Her sicca symptoms are not as severe currently. She's been using Biotene products and also Systane eye drops which help. Fibromyalgia flares off and on. Patient reports that she has burning during urination and will be going to her PCP today.  Activities of Daily Living:  Patient reports morning stiffness for 2 hours.   Patient Denies nocturnal pain.  Difficulty dressing/grooming: Denies Difficulty climbing stairs: Reports Difficulty getting out of chair: Reports Difficulty using hands for taps, buttons, cutlery, and/or writing: Denies   Review of Systems  Constitutional: Positive for fatigue. Negative for night sweats, weight gain, weight loss and weakness.  HENT: Positive for mouth dryness. Negative for mouth sores, trouble swallowing, trouble swallowing and nose dryness.   Eyes: Positive for dryness. Negative for pain, redness and visual disturbance.  Respiratory: Negative for cough, shortness of breath and difficulty breathing.   Cardiovascular: Positive for hypertension. Negative for chest pain, palpitations, irregular heartbeat and swelling in legs/feet.  Gastrointestinal: Negative for blood in stool, constipation and diarrhea.  Endocrine: Negative for increased urination.  Genitourinary: Negative for vaginal dryness.  Musculoskeletal: Positive for arthralgias, joint pain, myalgias, morning stiffness and myalgias. Negative for joint swelling, muscle weakness and muscle tenderness.  Skin: Negative for  color change, rash, hair loss, skin tightness, ulcers and sensitivity to sunlight.  Allergic/Immunologic: Negative for susceptible to infections.  Neurological: Negative for dizziness, memory loss and night sweats.  Hematological: Negative for swollen glands.  Psychiatric/Behavioral: Negative for depressed mood and sleep disturbance. The patient is not nervous/anxious.     PMFS History:  Patient Active Problem List   Diagnosis Date Noted  . Lymphadenopathy of head and neck region 10/29/2016  . Acute bronchitis 09/24/2016  . High risk medication use 06/05/2016  . Peripheral neuropathy 01/30/2016  . DNR no code (do not resuscitate) 12/08/2015  . 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 TIA (transient ischemic attack) 10/12/2015  . Abnormal urinalysis 10/12/2015  . Mild mitral regurgitation 10/12/2015  . Osteopenia 09/18/2015  . Right arm pain 08/29/2015  . Fall   . Trimalleolar fracture of left ankle 02/23/2015  . Right sided sciatica 02/21/2015  . Imbalance 01/12/2015  . Transaminitis 12/24/2014  . DDD (degenerative disc disease), cervical   . Health maintenance examination 10/18/2014  . Advanced care planning/counseling discussion 10/18/2014  . Medicare annual wellness visit, initial 10/18/2014  . Refusal of blood transfusions as patient is Jehovah's Witness   . Sjogren's syndrome ([place])   . CKD (chronic kidney disease) stage 3, GFR 30-59 ml/min   . Glaucoma   . Fibromyalgia   . Osteoarthritis   . History of pulmonary embolism   . Lung nodule 09/22/2012  . Diastolic CHF ([place]) 75/64/3329  . Aortic stenosis 04/22/2011  . Palpitations 08/10/2010  . Dyspnea 08/10/2010  . HOT FLASHES 06/28/2008  . Trochanteric bursitis of right hip 06/28/2008  . ARTHRALGIA 07/01/2007  . BACK  PAIN, LEFT 08/25/2006  . GAD (generalized anxiety disorder) 03/28/2006  . MITRAL VALVE REPLACEMENT, HX OF 03/28/2006  . TAH/BSO, HX OF 03/28/2006  . HYPERCHOLESTEROLEMIA 12/31/2005    . MDD (major depressive disorder), recurrent episode ([place]) 12/31/2005  . RHEUMATIC HEART DISEASE 12/31/2005  . Essential hypertension 12/31/2005  . Chronic insomnia 12/31/2005    Past Medical History:  Diagnosis Date  . Ankle fracture, left 02/23/2015  . Ankle fracture, left 02/19/2015   from a fall  . Anxiety   . CHF (congestive heart failure) ([place])   . CKD (chronic kidney disease) stage 3, GFR 30-59 ml/min    saw nephrologist Dr Harden Mo  . DDD (degenerative disc disease), cervical   . Depression   . Fibromyalgia   . Glaucoma    s/p surgery, sees ophtho Q6 mo  . 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 History of kidney stones 2010  . History of pulmonary embolism 2001, 2006   completed coumadin courses  . History of rheumatic fever x3  . HLD (hyperlipidemia)   . HTN (hypertension)   . Insomnia   . Lung nodule 09/22/2012   RLL - 80mm, stable since 2014. Thought benign.   . Osteoarthritis    shoulders and knees, not RA per Dr Estanislado Pandy, positive ANA, positive Ro  . Osteopenia 09/18/2015   DEXA T -1.1 hip, -0.2 spine 08/2015   . Personal history of urinary calculi latest 2014  . Pneumonia 12/02/2011  . PONV (postoperative nausea and vomiting)   . Refusal of blood transfusions as patient is Jehovah's Witness   . Rheumatic heart disease 1980   s/p mitral valve repair 1980  . Sjogren's syndrome ([place])     Family History  Problem Relation Age of Onset  . Lupus Sister        and niece  . Cancer Mother        lung (nonsmoker)  . CAD Mother        MI in her 11s  . ALS Mother   . Kidney disease Father   . Alcohol abuse Father   . Diabetes Father   . Cancer Brother        bone  . Diabetes Sister   . Stroke Sister   . Cancer Maternal Uncle        bone  . Depression Sister   . Kidney failure Other        on HD  . Diabetes Brother   . Heart attack Brother   . Stroke Maternal Grandmother    Past Surgical  History:  Procedure Laterality Date  . BREAST BIOPSY Right 2006   benign  . CHOLECYSTECTOMY  11/27/2011   Procedure: LAPAROSCOPIC CHOLECYSTECTOMY WITH INTRAOPERATIVE CHOLANGIOGRAM;  Surgeon: Adin Hector, MD;  Location: [facility];  Service: General;  Laterality: N/A;  laparoscopic cholecystectomy with choleangiogram umbilical hernia repair  . COLONOSCOPY  07/2014   WNL Amedeo Plenty)  . dexa  08/2012   normal per patient - no records available  . MITRAL VALVE REPAIR  1980   open heart  . ORIF ANKLE FRACTURE Left 02/26/2015   Procedure: OPEN REDUCTION INTERNAL FIXATION (ORIF) LEFT TRIMALLEOLAR ANKLE FRACTURE;  Surgeon: [name], MD;  Location: [facility];  Service: Orthopedics;  Laterality: Left;  . TUBAL LIGATION  1980  . UMBILICAL HERNIA REPAIR  11/27/2011   Procedure: HERNIA REPAIR UMBILICAL ADULT;  Surgeon: Adin Hector, MD;  Location: [facility];  Service: General;  Laterality: N/A;  . VAGINAL HYSTERECTOMY  1992   for fibroids -- partial, ovaries remain   Social History   Social History Narrative   Lives with son, 1 dog   Occupation: unemployed, on disability for fibromyalgia since 2008.   Edu: HS   Religion: Jehova's witness   Activity: volunteers at senior center   Diet: some water, fruits/vegetables daily   No caffeine use     Objective: Vital Signs: BP (!) 141/93 (BP Location: Left Arm, Patient Position: Sitting, Cuff Size: Normal)   Pulse 73   Ht 5\' 1"  (1.549 m)   Wt 183 lb (83 kg)   BMI 34.58 kg/m    Physical Exam  Constitutional: She is oriented to person, place, and time. She appears well-developed and well-nourished.  HENT:  Head: Normocephalic and atraumatic.  Eyes: Conjunctivae and EOM are normal.  Neck: Normal range of motion.  Cardiovascular: Normal rate, regular rhythm, normal heart sounds and intact distal pulses.   Pulmonary/Chest: Effort normal and breath sounds normal.  Abdominal: Soft. Bowel sounds are normal.  Lymphadenopathy:    She has no cervical  adenopathy.  Neurological: She is alert and oriented to person, place, and time.  Skin: Skin is warm and dry. Capillary refill takes less than 2 seconds.  Psychiatric: She has a normal mood and affect. Her behavior is normal.  Nursing note and vitals reviewed.    Musculoskeletal Exam: C-spine and thoracic spine good range of motion. She has limited range of motion of her lumbar spine. Shoulder joints although joints wrist joints with good range of motion. She has no synovitis on her hands. Hip joints knee joints ankles MTPs PIPs with good range of motion. She has some generalized hyperalgesia from fibromyalgia and few tender points.  CDAI Exam: No CDAI exam completed.    Investigation: UDS 10/2016 No additional findings. CBC Latest Ref Rng & Units 10/29/2016 09/26/2016 09/09/2016  WBC 4.0 - 10.5 K/uL 4.9 5.2 -  Hemoglobin 12.0 - 15.0 g/dL 12.6 11.8 13.9  Hematocrit 36.0 - 46.0 % 38.6 36.8 41.0  Platelets 150.0 - 400.0 K/uL 223.0 Repeated and verified X2. 232 -   CMP     Component Value Date/Time   NA 137 10/29/2016 1538   NA 140 12/14/2014   K 3.6 10/29/2016 1538   K 4.5 12/14/2014   CL 104 10/29/2016 1538   CO2 23 10/29/2016 1538   GLUCOSE 140 (H) 10/29/2016 1538   BUN 11 10/29/2016 1538   CREATININE 1.18 10/29/2016 1538   CREATININE 1.13 (H) 09/26/2016 1558   CALCIUM 9.5 10/29/2016 1538   PROT 6.8 10/29/2016 1538   PROT 7.5 11/18/2014 1146   ALBUMIN 4.2 10/29/2016 1538   AST 26 10/29/2016 1538   AST 46 12/14/2014   ALT 14 10/29/2016 1538   ALT 38 12/14/2014   ALKPHOS 50 10/29/2016 1538   ALKPHOS 75 12/14/2014   BILITOT 0.4 10/29/2016 1538   BILITOT 0.4 12/14/2014   GFRNONAA 51 (L) 09/26/2016 1558   GFRAA 59 (L) 09/26/2016 1558    Imaging: No results found.  Speciality Comments: No specialty comments available.    Procedures:  No procedures performed Allergies: Cymbalta [duloxetine hcl]; Statins; and Sulfa antibiotics   Assessment / Plan:     Visit  Diagnoses: Sjogren's syndrome, with unspecified organ involvement ([place]) - +ANA, +Ro. Her sicca symptoms are tolerable on Plaquenil, Restasis and pilocarpine. Over-the-counter products have been helpful.  High risk medication use - Plaquenil 200 mg by mouth  daily Monday to Friday, pilocarpine 5 mg by mouth 3 times a day when necessary. Her labs are up-to-date and stable.   DDD (degenerative disc disease), cervical: She continues to have some stiffness in her C-spine for which exercises were discussed.  Osteopenia of multiple sites: Use of calcium and vitamin D and resistive exercises was discussed.  Fibromyalgia: She continues to have some generalized pain and myalgias.  Chronic midline low back pain without sciatica  Other insomnia: Good sleep hygiene was discussed.  Other fatigue : Related to insomnia.  Dysuria: Patient has appointment with PCP.  Chronic pain: She is on tramadol. Her urine drug screen was consistent with treatment. She's been using tramadol only on when necessary basis for flares.   Orders: No orders of the defined types were placed in this encounter.  No orders of the defined types were placed in this encounter.     Follow-Up Instructions: Return in about 6 months (around 05/15/2017) for Sjogren's DDD FMS.   Bo Merino, MD  Note - This record has been created using Editor, commissioning.  Chart creation errors have been sought, but may not always  have been located. Such creation errors do not reflect on  the standard of medical care.

## 2016-11-14 ENCOUNTER — Ambulatory Visit (HOSPITAL_COMMUNITY)
Admission: EM | Admit: 2016-11-14 | Discharge: 2016-11-14 | Disposition: A | Discharge status: Home / Self Care | Payer: Medicare Other | Attending: Family Medicine | Admitting: Family Medicine

## 2016-11-14 ENCOUNTER — Encounter: Payer: Self-pay | Admitting: Rheumatology

## 2016-11-14 ENCOUNTER — Encounter (HOSPITAL_COMMUNITY): Payer: Self-pay | Admitting: *Deleted

## 2016-11-14 ENCOUNTER — Ambulatory Visit (INDEPENDENT_AMBULATORY_CARE_PROVIDER_SITE_OTHER): Payer: Medicare Other | Admitting: Rheumatology

## 2016-11-14 VITALS — BP 141/93 | HR 73 | Ht 61.0 in | Wt 183.0 lb

## 2016-11-14 DIAGNOSIS — N3090 Cystitis, unspecified without hematuria: Secondary | ICD-10-CM | POA: Insufficient documentation

## 2016-11-14 DIAGNOSIS — M545 Low back pain: Secondary | ICD-10-CM | POA: Diagnosis not present

## 2016-11-14 DIAGNOSIS — Z79899 Other long term (current) drug therapy: Secondary | ICD-10-CM | POA: Diagnosis not present

## 2016-11-14 DIAGNOSIS — M797 Fibromyalgia: Secondary | ICD-10-CM

## 2016-11-14 DIAGNOSIS — Z9889 Other specified postprocedural states: Secondary | ICD-10-CM | POA: Diagnosis not present

## 2016-11-14 DIAGNOSIS — I509 Heart failure, unspecified: Secondary | ICD-10-CM | POA: Insufficient documentation

## 2016-11-14 DIAGNOSIS — R81 Glycosuria: Secondary | ICD-10-CM | POA: Insufficient documentation

## 2016-11-14 DIAGNOSIS — G4709 Other insomnia: Secondary | ICD-10-CM | POA: Diagnosis not present

## 2016-11-14 DIAGNOSIS — M8589 Other specified disorders of bone density and structure, multiple sites: Secondary | ICD-10-CM

## 2016-11-14 DIAGNOSIS — R5383 Other fatigue: Secondary | ICD-10-CM | POA: Diagnosis not present

## 2016-11-14 DIAGNOSIS — Z86718 Personal history of other venous thrombosis and embolism: Secondary | ICD-10-CM | POA: Diagnosis not present

## 2016-11-14 DIAGNOSIS — M5030 Other cervical disc degeneration, unspecified cervical region: Secondary | ICD-10-CM | POA: Diagnosis not present

## 2016-11-14 DIAGNOSIS — F419 Anxiety disorder, unspecified: Secondary | ICD-10-CM | POA: Insufficient documentation

## 2016-11-14 DIAGNOSIS — I130 Hypertensive heart and chronic kidney disease with heart failure and stage 1 through stage 4 chronic kidney disease, or unspecified chronic kidney disease: Secondary | ICD-10-CM | POA: Diagnosis not present

## 2016-11-14 DIAGNOSIS — E785 Hyperlipidemia, unspecified: Secondary | ICD-10-CM | POA: Diagnosis not present

## 2016-11-14 DIAGNOSIS — M3500 Sicca syndrome, unspecified: Secondary | ICD-10-CM | POA: Insufficient documentation

## 2016-11-14 DIAGNOSIS — G8929 Other chronic pain: Secondary | ICD-10-CM

## 2016-11-14 DIAGNOSIS — R350 Frequency of micturition: Secondary | ICD-10-CM | POA: Diagnosis present

## 2016-11-14 DIAGNOSIS — M8580 Other specified disorders of bone density and structure, unspecified site: Secondary | ICD-10-CM | POA: Diagnosis not present

## 2016-11-14 DIAGNOSIS — R300 Dysuria: Secondary | ICD-10-CM | POA: Diagnosis present

## 2016-11-14 DIAGNOSIS — N183 Chronic kidney disease, stage 3 (moderate): Secondary | ICD-10-CM | POA: Diagnosis not present

## 2016-11-14 DIAGNOSIS — F329 Major depressive disorder, single episode, unspecified: Secondary | ICD-10-CM | POA: Diagnosis not present

## 2016-11-14 LAB — POCT URINALYSIS DIP (DEVICE)
GLUCOSE, UA: 100 mg/dL — AB
Hgb urine dipstick: NEGATIVE
KETONES UR: NEGATIVE mg/dL
Nitrite: NEGATIVE
PROTEIN: 30 mg/dL — AB
Specific Gravity, Urine: >=1.03 (ref 1.005–1.030)
Urobilinogen, UA: 4 mg/dL — ABNORMAL HIGH (ref 0.0–1.0)
pH: 6 (ref 5.0–8.0)

## 2016-11-14 LAB — GLUCOSE, CAPILLARY: GLUCOSE-CAPILLARY: 94 mg/dL (ref 65–99)

## 2016-11-14 MED ORDER — CEPHALEXIN 500 MG PO CAPS: 500.0000 mg | ORAL_CAPSULE | Freq: Two times a day (BID) | ORAL | 0 refills | Status: DC

## 2016-11-14 NOTE — ED Triage Notes (Signed)
Pt  Reports  Low  abd pain  With  Burning   When  She  Urinates   And   Sensation of  Bladder  Not  Emptying   Well      Symptoms  For  Several  Days

## 2016-11-14 NOTE — Progress Notes (Deleted)
Office Visit Note  Patient: Alexandria Leach             Date of Birth: [birth year]           MRN: [id number]             PCP: Ria Bush, MD Visit Date: 11/14/2016   Assessment: Visit Diagnoses: Sjogren's syndrome, with unspecified organ involvement ([place]) - +ANA, +Ro  High risk medication use - Plaquenil 200 mg by mouth daily Monday to Friday, pilocarpine 5 mg by mouth 3 times a day when necessary.   DDD (degenerative disc disease), cervical  Osteopenia of multiple sites  Fibromyalgia  Chronic midline low back pain without sciatica  Other insomnia  Other fatigue   Follow-Up Instructions: Return in about 6 months (around 05/15/2017) for Sjogren's DDD FMS.  Orders: No orders of the defined types were placed in this encounter.  No orders of the defined types were placed in this encounter.    Subjective:    Allergies: Cymbalta [duloxetine hcl]; Statins; and Sulfa antibiotics   Activities of Daily Living: ***   History of Present Illness: Alexandria Leach is a 66 y.o. female ***   Review of Systems  Constitutional: Positive for fatigue.  HENT: Negative.   Eyes: Negative.   Respiratory: Positive for cough.   Cardiovascular: Positive for swelling in legs/feet.  Gastrointestinal: Negative.   Endocrine: Negative.  Negative for increased urination.  Genitourinary: Positive for decreased urine output, difficulty urinating and painful urination.  Musculoskeletal: Positive for arthralgias, joint pain, joint swelling, muscle weakness, morning stiffness and muscle tenderness.  Skin: Negative.   Neurological: Negative.   Hematological: Positive for bruising/bleeding tendency.  Psychiatric/Behavioral: Negative.      Investigation: No additional findings.   Objective: Vital Signs: BP (!) 141/93 (BP Location: Left Arm, Patient Position: Sitting, Cuff Size: Normal)   Pulse 73   Ht 5\' 1"  (1.549 m)   Wt 183 lb (83 kg)   BMI 34.58 kg/m    Physical Exam    Musculoskeletal Exam: ***  CDAI Exam: No CDAI exam completed.  CDAI comments:  Speciality Comments: No specialty comments available.  Imaging: No results found.   PMFS History:  Patient Active Problem List   Diagnosis Date Noted  . Lymphadenopathy of head and neck region 10/29/2016  . Acute bronchitis 09/24/2016  . High risk medication use 06/05/2016  . Peripheral neuropathy 01/30/2016  . DNR no code (do not resuscitate) 12/08/2015  . 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 TIA (transient ischemic attack) 10/12/2015  . Abnormal urinalysis 10/12/2015  . Mild mitral regurgitation 10/12/2015  . Osteopenia 09/18/2015  . Right arm pain 08/29/2015  . Fall   . Trimalleolar fracture of left ankle 02/23/2015  . Right sided sciatica 02/21/2015  . Imbalance 01/12/2015  . Transaminitis 12/24/2014  . DDD (degenerative disc disease), cervical   . Health maintenance examination 10/18/2014  . Advanced care planning/counseling discussion 10/18/2014  . Medicare annual wellness visit, initial 10/18/2014  . Refusal of blood transfusions as patient is Jehovah's Witness   . Sjogren's syndrome ([place])   . CKD (chronic kidney disease) stage 3, GFR 30-59 ml/min   . Glaucoma   . Fibromyalgia   . Osteoarthritis   . History of pulmonary embolism   . Lung nodule 09/22/2012  . Diastolic CHF ([place]) 59/56/3875  . Aortic stenosis 04/22/2011  . Palpitations 08/10/2010  . Dyspnea 08/10/2010  . HOT FLASHES 06/28/2008  . Trochanteric bursitis of right hip 06/28/2008  .  ARTHRALGIA 07/01/2007  . BACK PAIN, LEFT 08/25/2006  . GAD (generalized anxiety disorder) 03/28/2006  . MITRAL VALVE REPLACEMENT, HX OF 03/28/2006  . TAH/BSO, HX OF 03/28/2006  . HYPERCHOLESTEROLEMIA 12/31/2005  . MDD (major depressive disorder), recurrent episode ([place]) 12/31/2005  . RHEUMATIC HEART DISEASE 12/31/2005  . Essential hypertension 12/31/2005  . Chronic insomnia 12/31/2005    Past Medical  History:  Diagnosis Date  . Ankle fracture, left 02/23/2015  . Ankle fracture, left 02/19/2015   from a fall  . Anxiety   . CHF (congestive heart failure) ([place])   . CKD (chronic kidney disease) stage 3, GFR 30-59 ml/min    saw nephrologist Dr Harden Mo  . DDD (degenerative disc disease), cervical   . Depression   . Fibromyalgia   . Glaucoma    s/p surgery, sees ophtho Q6 mo  . 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 History of kidney stones 2010  . History of pulmonary embolism 2001, 2006   completed coumadin courses  . History of rheumatic fever x3  . HLD (hyperlipidemia)   . HTN (hypertension)   . Insomnia   . Lung nodule 09/22/2012   RLL - 59mm, stable since 2014. Thought benign.   . Osteoarthritis    shoulders and knees, not RA per Dr Estanislado Pandy, positive ANA, positive Ro  . Osteopenia 09/18/2015   DEXA T -1.1 hip, -0.2 spine 08/2015   . Personal history of urinary calculi latest 2014  . Pneumonia 12/02/2011  . PONV (postoperative nausea and vomiting)   . Refusal of blood transfusions as patient is Jehovah's Witness   . Rheumatic heart disease 1980   s/p mitral valve repair 1980  . Sjogren's syndrome ([place])     Family History  Problem Relation Age of Onset  . Lupus Sister        and niece  . Cancer Mother        lung (nonsmoker)  . CAD Mother        MI in her 63s  . ALS Mother   . Kidney disease Father   . Alcohol abuse Father   . Diabetes Father   . Cancer Brother        bone  . Diabetes Sister   . Stroke Sister   . Cancer Maternal Uncle        bone  . Depression Sister   . Kidney failure Other        on HD  . Diabetes Brother   . Heart attack Brother   . Stroke Maternal Grandmother    Past Surgical History:  Procedure Laterality Date  . BREAST BIOPSY Right 2006   benign  . CHOLECYSTECTOMY  11/27/2011   Procedure: LAPAROSCOPIC CHOLECYSTECTOMY WITH INTRAOPERATIVE CHOLANGIOGRAM;  Surgeon:  Adin Hector, MD;  Location: [facility];  Service: General;  Laterality: N/A;  laparoscopic cholecystectomy with choleangiogram umbilical hernia repair  . COLONOSCOPY  07/2014   WNL Amedeo Plenty)  . dexa  08/2012   normal per patient - no records available  . MITRAL VALVE REPAIR  1980   open heart  . ORIF ANKLE FRACTURE Left 02/26/2015   Procedure: OPEN REDUCTION INTERNAL FIXATION (ORIF) LEFT TRIMALLEOLAR ANKLE FRACTURE;  Surgeon: [name], MD;  Location: [facility];  Service: Orthopedics;  Laterality: Left;  . TUBAL LIGATION  1980  . UMBILICAL HERNIA REPAIR  11/27/2011   Procedure: HERNIA REPAIR UMBILICAL ADULT;  Surgeon: Adin Hector, MD;  [facility];  Service: General;  Laterality: N/A;  . [place]   for fibroids -- partial, ovaries remain   Social History   Social History Narrative   Lives with son, 1 dog   Occupation: unemployed, on disability for fibromyalgia since 2008.   Edu: HS   Religion: Jehova's witness   Activity: volunteers at senior center   Diet: some water, fruits/vegetables daily   No caffeine use     Procedures:  No procedures performed  Bo Merino, MD  Note - This record has been created using Editor, commissioning. Chart creation errors have been sought, but may not always have been located. Such creation errors do not reflect on the standard of medical care.

## 2016-11-14 NOTE — ED Provider Notes (Signed)
  [facility]    ASSESSMENT & PLAN:  Today you were diagnosed with the following: 1. Cystitis   2. Glucosuria with normal serum glucose    Will f/u with PCP to recheck fasting blood sugar.  Meds ordered this encounter  Medications  . cephALEXin (KEFLEX) 500 MG capsule    Sig: Take 1 capsule (500 mg total) by mouth 2 (two) times daily.    Dispense:  10 capsule    Refill:  0   Urine culture sent. Will notify upon receipt of results. Finish antibiotic. Follow up if not improving within the next 48 hours or if worsening.  Outlined signs and symptoms indicating need for more acute intervention. Patient verbalized understanding. After Visit Summary given.  SUBJECTIVE:  Alexandria Leach is a 66 y.o. female who complains of urinary frequency, urgency and dysuria for the past several days. No flank pain, fever, chills, abnormal vaginal discharge or bleeding. Hematuria: not present.  Normal PO intake. No flank or abdominal pain. No self treatment.  OBJECTIVE:  Vitals:   11/14/16 1117  BP: 130/81  Pulse: 74  Resp: 16  Temp: 98.3 F (36.8 C)  TempSrc: Oral  SpO2: 96%   Appears well, in no apparent distress. Abdomen is soft without tenderness, guarding, mass, rebound or organomegaly. No CVA tenderness or inguinal adenopathy noted.  Labs Reviewed  POCT URINALYSIS DIP (DEVICE) - Abnormal; Notable for the following:       Result Value   Glucose, UA 100 (*)    Bilirubin Urine SMALL (*)    Protein, ur 30 (*)    Urobilinogen, UA 4.0 (*)    Leukocytes, UA TRACE (*)    All other components within normal limits  URINE CULTURE  GLUCOSE, CAPILLARY    Allergies  Allergen Reactions  . Cymbalta [Duloxetine Hcl] Other (See Comments)    tachycardia  . Statins Nausea Only and Other (See Comments)    Muscle cramps also  . Sulfa Antibiotics Nausea And Vomiting    Past Medical History:  Diagnosis Date  . Ankle fracture, left 02/23/2015  . Ankle fracture, left 02/19/2015     from a fall  . Anxiety   . CHF (congestive heart failure) ([place])   . CKD (chronic kidney disease) stage 3, GFR 30-59 ml/min    saw nephrologist Dr Harden Mo  . DDD (degenerative disc disease), cervical   . Depression   . Fibromyalgia   . Glaucoma    s/p surgery, sees ophtho Q6 mo  . 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 History of kidney stones 2010  . History of pulmonary embolism 2001, 2006   completed coumadin courses  . History of rheumatic fever x3  . HLD (hyperlipidemia)   . HTN (hypertension)   . Insomnia   . Lung nodule 09/22/2012   RLL - 17mm, stable since 2014. Thought benign.   . Osteoarthritis    shoulders and knees, not RA per Dr Estanislado Pandy, positive ANA, positive Ro  . Osteopenia 09/18/2015   DEXA T -1.1 hip, -0.2 spine 08/2015   . Personal history of urinary calculi latest 2014  . Pneumonia 12/02/2011  . PONV (postoperative nausea and vomiting)   . Refusal of blood transfusions as patient is Jehovah's Witness   . Rheumatic heart disease 1980   s/p mitral valve repair 1980  . Sjogren's syndrome [facility])         Vanessa Kick, MD 11/14/16 1240

## 2016-11-14 NOTE — Discharge Instructions (Signed)
We have sent a urine culture and will contact you when the results are available.  You may use over the counter Pyridium if needed for any discomfort associated with urination. Please do your best to ensure adequate fluid intake.  If you are not improving over the next few days or feel you are worsening please follow up here or the Emergency Department if you are unable to see your regular doctor.

## 2016-11-15 ENCOUNTER — Encounter (HOSPITAL_COMMUNITY): Payer: Self-pay | Admitting: *Deleted

## 2016-11-15 ENCOUNTER — Emergency Department (HOSPITAL_COMMUNITY)
Admission: EM | Admit: 2016-11-15 | Discharge: 2016-11-15 | Disposition: A | Discharge status: Home / Self Care | Payer: Medicare Other | Attending: Emergency Medicine | Admitting: Emergency Medicine

## 2016-11-15 DIAGNOSIS — I130 Hypertensive heart and chronic kidney disease with heart failure and stage 1 through stage 4 chronic kidney disease, or unspecified chronic kidney disease: Secondary | ICD-10-CM | POA: Diagnosis not present

## 2016-11-15 DIAGNOSIS — N3090 Cystitis, unspecified without hematuria: Secondary | ICD-10-CM | POA: Insufficient documentation

## 2016-11-15 DIAGNOSIS — Z8673 Personal history of transient ischemic attack (TIA), and cerebral infarction without residual deficits: Secondary | ICD-10-CM | POA: Insufficient documentation

## 2016-11-15 DIAGNOSIS — N183 Chronic kidney disease, stage 3 (moderate): Secondary | ICD-10-CM | POA: Diagnosis not present

## 2016-11-15 DIAGNOSIS — Z79899 Other long term (current) drug therapy: Secondary | ICD-10-CM | POA: Insufficient documentation

## 2016-11-15 DIAGNOSIS — Z86718 Personal history of other venous thrombosis and embolism: Secondary | ICD-10-CM | POA: Insufficient documentation

## 2016-11-15 DIAGNOSIS — R300 Dysuria: Secondary | ICD-10-CM | POA: Diagnosis present

## 2016-11-15 DIAGNOSIS — I5032 Chronic diastolic (congestive) heart failure: Secondary | ICD-10-CM | POA: Insufficient documentation

## 2016-11-15 LAB — I-STAT CHEM 8, ED
BUN: 8 mg/dL (ref 6–20)
CHLORIDE: 105 mmol/L (ref 101–111)
Calcium, Ion: 1.11 mmol/L — ABNORMAL LOW (ref 1.15–1.40)
Creatinine, Ser: 1.1 mg/dL — ABNORMAL HIGH (ref 0.44–1.00)
Glucose, Bld: 98 mg/dL (ref 65–99)
HCT: 37 % (ref 36.0–46.0)
HEMOGLOBIN: 12.6 g/dL (ref 12.0–15.0)
POTASSIUM: 3.6 mmol/L (ref 3.5–5.1)
SODIUM: 140 mmol/L (ref 135–145)
TCO2: 26 mmol/L (ref 22–32)

## 2016-11-15 LAB — URINALYSIS, ROUTINE W REFLEX MICROSCOPIC
BACTERIA UA: NONE SEEN
Bilirubin Urine: NEGATIVE
Glucose, UA: NEGATIVE mg/dL
Hgb urine dipstick: NEGATIVE
KETONES UR: NEGATIVE mg/dL
Leukocytes, UA: NEGATIVE
Nitrite: NEGATIVE
PROTEIN: 30 mg/dL — AB
Specific Gravity, Urine: 1.025 (ref 1.005–1.030)
pH: 6 (ref 5.0–8.0)

## 2016-11-15 LAB — URINE CULTURE: Culture: NO GROWTH

## 2016-11-15 MED ORDER — CIPROFLOXACIN HCL 500 MG PO TABS: 500.0000 mg | ORAL_TABLET | Freq: Two times a day (BID) | ORAL | 0 refills | Status: DC

## 2016-11-15 NOTE — ED Provider Notes (Signed)
[facility] DEPT Provider Note   CSN: [id number] Arrival date & time: 11/15/16  1828     History   Chief Complaint Chief Complaint  Patient presents with  . Urinary Retention  . Urinary Tract Infection    HPI Alexandria Leach is a 66 y.o. female.  HPI 66 year old female since today with pain with urination for 3 days. She was seen in urgent care yesterday and started on Keflex. She continues to have painful urination. She was unable to void any significant amount until she was here. She then was able to give a urine sample. She states she has had some urinary tract infection past. She has not had any recently. She denies nausea, vomiting, or fever. Past Medical History:  Diagnosis Date  . Ankle fracture, left 02/23/2015  . Ankle fracture, left 02/19/2015   from a fall  . Anxiety   . CHF (congestive heart failure) ([place])   . CKD (chronic kidney disease) stage 3, GFR 30-59 ml/min    saw nephrologist Dr Harden Mo  . DDD (degenerative disc disease), cervical   . Depression   . Fibromyalgia   . Glaucoma    s/p surgery, sees ophtho Q6 mo  . 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 History of kidney stones 2010  . History of pulmonary embolism 2001, 2006   completed coumadin courses  . History of rheumatic fever x3  . HLD (hyperlipidemia)   . HTN (hypertension)   . Insomnia   . Lung nodule 09/22/2012   RLL - 52mm, stable since 2014. Thought benign.   . Osteoarthritis    shoulders and knees, not RA per Dr Estanislado Pandy, positive ANA, positive Ro  . Osteopenia 09/18/2015   DEXA T -1.1 hip, -0.2 spine 08/2015   . Personal history of urinary calculi latest 2014  . Pneumonia 12/02/2011  . PONV (postoperative nausea and vomiting)   . Refusal of blood transfusions as patient is Jehovah's Witness   . Rheumatic heart disease 1980   s/p mitral valve repair 1980  . Sjogren's syndrome [facility])     Patient Active Problem List   Diagnosis Date Noted  . Lymphadenopathy of head and neck region 10/29/2016  . Acute bronchitis 09/24/2016  . High risk medication use 06/05/2016  . Peripheral neuropathy 01/30/2016  . DNR no code (do not resuscitate) 12/08/2015  . 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 TIA (transient ischemic attack) 10/12/2015  . Abnormal urinalysis 10/12/2015  . Mild mitral regurgitation 10/12/2015  . Osteopenia 09/18/2015  . Right arm pain 08/29/2015  . Fall   . Trimalleolar fracture of left ankle 02/23/2015  . Right sided sciatica 02/21/2015  . Imbalance 01/12/2015  . Transaminitis 12/24/2014  . DDD (degenerative disc disease), cervical   . Health maintenance examination 10/18/2014  . Advanced care planning/counseling discussion 10/18/2014  . Medicare annual wellness visit, initial 10/18/2014  . Refusal of blood transfusions as patient is Jehovah's Witness   . Sjogren's syndrome ([place])   . CKD (chronic kidney disease) stage 3, GFR 30-59 ml/min   . Glaucoma   . Fibromyalgia   . Osteoarthritis   . History of pulmonary embolism   . Lung nodule 09/22/2012  . Diastolic CHF ([place]) 25/95/6387  . Aortic stenosis 04/22/2011  . Palpitations 08/10/2010  . Dyspnea 08/10/2010  . HOT FLASHES 06/28/2008  . Trochanteric bursitis of right hip 06/28/2008  . ARTHRALGIA 07/01/2007  . BACK PAIN, LEFT 08/25/2006  .  GAD (generalized anxiety disorder) 03/28/2006  . MITRAL VALVE REPLACEMENT, HX OF 03/28/2006  . TAH/BSO, HX OF 03/28/2006  . HYPERCHOLESTEROLEMIA 12/31/2005  . MDD (major depressive disorder), recurrent episode ([place]) 12/31/2005  . RHEUMATIC HEART DISEASE 12/31/2005  . Essential hypertension 12/31/2005  . Chronic insomnia 12/31/2005    Past Surgical History:  Procedure Laterality Date  . BREAST BIOPSY Right 2006   benign  . CHOLECYSTECTOMY  11/27/2011   Procedure: LAPAROSCOPIC CHOLECYSTECTOMY WITH INTRAOPERATIVE CHOLANGIOGRAM;  Surgeon: Adin Hector, MD;  Location: [facility];  Service: General;  Laterality: N/A;  laparoscopic cholecystectomy with choleangiogram umbilical hernia repair  . COLONOSCOPY  07/2014   WNL Amedeo Plenty)  . dexa  08/2012   normal per patient - no records available  . MITRAL VALVE REPAIR  1980   open heart  . ORIF ANKLE FRACTURE Left 02/26/2015   Procedure: OPEN REDUCTION INTERNAL FIXATION (ORIF) LEFT TRIMALLEOLAR ANKLE FRACTURE;  Surgeon: [name], MD;  Location: [facility];  Service: Orthopedics;  Laterality: Left;  . TUBAL LIGATION  1980  . UMBILICAL HERNIA REPAIR  11/27/2011   Procedure: HERNIA REPAIR UMBILICAL ADULT;  Surgeon: Adin Hector, MD;  Location: [facility];  Service: General;  Laterality: N/A;  . [place]   for fibroids -- partial, ovaries remain    OB History    No data available       Home Medications    Prior to Admission medications   Medication Sig Start Date End Date Taking? Authorizing Provider  ALPRAZolam Duanne Moron) 1 MG tablet Take 1 tablet (1 mg total) by mouth 3 (three) times daily as needed for anxiety. 09/04/16 09/04/17  Cloria Spring, MD  amLODipine (NORVASC) 10 MG tablet Take 1 tablet (10 mg total) by mouth daily. 12/27/15   Ria Bush, MD  antiseptic oral rinse (BIOTENE) LIQD 15 mLs by Mouth Rinse route at bedtime.     [provider]  aspirin 325 MG EC tablet Take 325 mg by mouth daily.     [provider]  cephALEXin (KEFLEX) 500 MG capsule Take 1 capsule (500 mg total) by mouth 2 (two) times daily. 11/14/16   Vanessa Kick, MD  cholecalciferol (VITAMIN D) 1000 UNITS tablet Take 1,000 Units by mouth daily.     [provider]  diclofenac sodium (VOLTAREN) 1 % GEL Apply 1 application topically 2 (two) times daily as needed (pain).    [provider]  escitalopram (LEXAPRO) 20 MG tablet Take 1 tablet (20 mg total) by mouth 2 (two) times daily. 09/04/16 09/04/17  Cloria Spring, MD  ezetimibe (ZETIA) 10 MG tablet Take 1 tablet (10 mg total) by mouth at  bedtime. 10/29/16   Ria Bush, MD  famotidine (PEPCID) 20 MG tablet TAKE 1 TABLET BY MOUTH TWO  TIMES DAILY 03/22/16   Ria Bush, MD  furosemide (LASIX) 20 MG tablet TAKE 1 TABLET BY MOUTH  DAILY AS NEEDED FOR EDEMA 03/20/16   Ria Bush, MD  gabapentin (NEURONTIN) 300 MG capsule Take 2 capsules (600 mg total) by mouth 3 (three) times daily. Take 2 capsules every morning, 1 capsule every afternoon and 2 capsules at bedtime. 05/29/16   Ria Bush, MD  hydrochlorothiazide (MICROZIDE) 12.5 MG capsule TAKE 1 CAPSULE BY MOUTH  DAILY 02/14/16   Ria Bush, MD  hydroxychloroquine (PLAQUENIL) 200 MG tablet TAKE 1 TABLET BY MOUTH ONCE A DAY ON MONDAY THROUGH  FRIDAY 08/16/16 11/14/16  Panwala, Naitik, PA-C  lovastatin (MEVACOR) 10 MG tablet  Take 1 tablet (10 mg total) by mouth every Monday, Wednesday, and Friday. 10/30/16   Ria Bush, MD  metoprolol succinate (TOPROL-XL) 50 MG 24 hr tablet Take 1 tablet by mouth 2 (two) times daily. 03/20/16   [provider]  pilocarpine (SALAGEN) 5 MG tablet Take 1 tablet (5 mg total) by mouth 3 (three) times daily as needed. 05/20/16 11/16/16  Panwala, Naitik, PA-C  Polyethyl Glycol-Propyl Glycol (SYSTANE) 0.4-0.3 % GEL Place 1 drop into both eyes 2 (two) times daily.     [provider]  RESTASIS 0.05 % ophthalmic emulsion INSTILL ONE DROP IN BOTH  EYES TWO TIMES DAILY 03/20/16   Ria Bush, MD  traMADol (ULTRAM) 50 MG tablet Take 1 tablet (50 mg total) by mouth every 12 (twelve) hours as needed for severe pain. 10/16/16   Bo Merino, MD  zolpidem (AMBIEN) 10 MG tablet Take 1 tablet (10 mg total) by mouth at bedtime as needed for sleep. 09/04/16 10/04/16  Cloria Spring, MD    Family History Family History  Problem Relation Age of Onset  . Lupus Sister        and niece  . Cancer Mother        lung (nonsmoker)  . CAD Mother        MI in her 46s  . ALS Mother   . Kidney disease Father   . Alcohol abuse Father    . Diabetes Father   . Cancer Brother        bone  . Diabetes Sister   . Stroke Sister   . Cancer Maternal Uncle        bone  . Depression Sister   . Kidney failure Other        on HD  . Diabetes Brother   . Heart attack Brother   . Stroke Maternal Grandmother     Social History Social History  Substance Use Topics  . Smoking status: Never Smoker  . Smokeless tobacco: Never Used  . Alcohol use No     Allergies   Cymbalta [duloxetine hcl]; Statins; and Sulfa antibiotics   Review of Systems Review of Systems  All other systems reviewed and are negative.    Physical Exam Updated Vital Signs BP 120/74   Pulse 73   Temp 98.7 F (37.1 C)   Resp 17   SpO2 95%   Physical Exam  Constitutional: She is oriented to person, place, and time. She appears well-developed and well-nourished. No distress.  HENT:  Head: Normocephalic and atraumatic.  Right Ear: External ear normal.  Left Ear: External ear normal.  Nose: Nose normal.  Eyes: Pupils are equal, round, and reactive to light. Conjunctivae and EOM are normal.  Neck: Normal range of motion. Neck supple.  Cardiovascular: Normal rate, regular rhythm and normal heart sounds.   Pulmonary/Chest: Effort normal and breath sounds normal.  Abdominal: Soft. Bowel sounds are normal.  Genitourinary:  Genitourinary Comments: No CVA tenderness  Musculoskeletal: Normal range of motion.  Neurological: She is alert and oriented to person, place, and time. She exhibits normal muscle tone. Coordination normal.  Skin: Skin is warm and dry.  Psychiatric: She has a normal mood and affect. Her behavior is normal. Thought content normal.  Nursing note and vitals reviewed.    ED Treatments / Results  Labs (all labs ordered are listed, but only abnormal results are displayed) Labs Reviewed  URINALYSIS, ROUTINE W REFLEX MICROSCOPIC - Abnormal; Notable for the following:  Result Value   Color, Urine AMBER (*)    Protein, ur 30  (*)    Squamous Epithelial / LPF 0-5 (*)    All other components within normal limits  I-STAT CHEM 8, ED - Abnormal; Notable for the following:    Creatinine, Ser 1.10 (*)    Calcium, Ion 1.11 (*)    All other components within normal limits    EKG  EKG Interpretation None       Radiology No results found.  Procedures Procedures (including critical care time)  Medications Ordered in ED Medications - No data to display   Initial Impression / Assessment and Plan / ED Course  I have reviewed the triage vital signs and the nursing notes.  Pertinent labs & imaging results that were available during my care of the patient were reviewed by me and considered in my medical decision making (see chart for details).   significant amount of urine is noted in the bladder. Urinalysis is clear. The symptoms significantly consistent with cystitis. Will change to Cipro. Patient advised return precautions and voices understanding.    Final Clinical Impressions(s) / ED Diagnoses   Final diagnoses:  Cystitis    New Prescriptions New Prescriptions   CIPROFLOXACIN (CIPRO) 500 MG TABLET    Take 1 tablet (500 mg total) by mouth every 12 (twelve) hours.     Pattricia Boss, MD 11/15/16 [phone]

## 2016-11-15 NOTE — ED Triage Notes (Signed)
Pt reports being seen at [facility] yesterday for oliguria onset x 5 days, pt dx with UTI yesteray & given antibiotix RX, pt reports last urination yesterday @ 15:00, pt afebrile in triage, A&O x4

## 2016-11-20 ENCOUNTER — Telehealth: Payer: Self-pay | Admitting: *Deleted

## 2016-11-20 NOTE — Telephone Encounter (Signed)
Received a drug utilization review from [employer]. Patient is on Lexapro and Tramadol. Patient is at risk of Serotonin syndrome or seizures taking these medications together. Patient to decrease Tramadol or taper off.

## 2016-11-20 NOTE — Telephone Encounter (Signed)
Patient advised of the recommendations. Patient states she is going to stop taking Tramadol.

## 2016-11-28 ENCOUNTER — Other Ambulatory Visit: Payer: Self-pay | Admitting: Rheumatology

## 2016-11-28 ENCOUNTER — Other Ambulatory Visit: Payer: Self-pay | Admitting: Family Medicine

## 2016-11-28 NOTE — Telephone Encounter (Signed)
Last Visit: 11/14/16 Next Visit: 05/16/17 Labs: 10/29/16 Elevated glucose PLQ Eye Exam: 06/05/16 WNL   Okay to refill per Dr. Estanislado Pandy

## 2016-11-30 ENCOUNTER — Other Ambulatory Visit: Payer: Self-pay | Admitting: Family Medicine

## 2016-11-30 ENCOUNTER — Other Ambulatory Visit: Payer: Self-pay | Admitting: Cardiology

## 2016-12-02 NOTE — Telephone Encounter (Signed)
Rx(s) sent to pharmacy electronically.  

## 2016-12-05 ENCOUNTER — Other Ambulatory Visit: Payer: Self-pay | Admitting: *Deleted

## 2016-12-05 MED ORDER — TRAMADOL HCL 50 MG PO TABS: 50.0000 mg | ORAL_TABLET | Freq: Three times a day (TID) | ORAL | 3 refills | Status: DC

## 2016-12-05 NOTE — Telephone Encounter (Signed)
Refill request received via fax  Last Visit: 07/15/16 Next Visit: 05/16/17 UDS: 10/16/16 Narcotic Agreement: 10/16/16  Okay to refill Tramadol?

## 2016-12-05 NOTE — Telephone Encounter (Signed)
ok 

## 2016-12-09 ENCOUNTER — Ambulatory Visit (INDEPENDENT_AMBULATORY_CARE_PROVIDER_SITE_OTHER): Payer: Medicare Other

## 2016-12-09 ENCOUNTER — Other Ambulatory Visit: Payer: Self-pay | Admitting: Family Medicine

## 2016-12-09 VITALS — BP 108/64 | HR 66 | Temp 98.0°F | Ht 62.0 in | Wt 175.5 lb

## 2016-12-09 DIAGNOSIS — E7800 Pure hypercholesterolemia, unspecified: Secondary | ICD-10-CM

## 2016-12-09 DIAGNOSIS — N183 Chronic kidney disease, stage 3 unspecified: Secondary | ICD-10-CM

## 2016-12-09 DIAGNOSIS — Z0000 Encounter for general adult medical examination without abnormal findings: Secondary | ICD-10-CM | POA: Diagnosis not present

## 2016-12-09 LAB — RENAL FUNCTION PANEL
ALBUMIN: 4.2 g/dL (ref 3.5–5.2)
BUN: 9 mg/dL (ref 6–23)
CHLORIDE: 108 meq/L (ref 96–112)
CO2: 26 mEq/L (ref 19–32)
Calcium: 10 mg/dL (ref 8.4–10.5)
Creatinine, Ser: 1.1 mg/dL (ref 0.40–1.20)
GFR: 64.23 mL/min (ref >60.00)
GLUCOSE: 101 mg/dL — AB (ref 70–99)
PHOSPHORUS: 2.9 mg/dL (ref 2.3–4.6)
POTASSIUM: 3.8 meq/L (ref 3.5–5.1)
SODIUM: 143 meq/L (ref 135–145)

## 2016-12-09 LAB — LDL CHOLESTEROL, DIRECT: Direct LDL: 77 mg/dL

## 2016-12-09 NOTE — Progress Notes (Signed)
Pre visit review using our clinic review tool, if applicable. No additional management support is needed unless otherwise documented below in the visit note. 

## 2016-12-09 NOTE — Progress Notes (Signed)
PCP notes:   Health maintenance:  Flu vaccine - patient declined  Abnormal screenings:   Depression score: 4 Mini-Cog score: 19/20  Hearing - failed  Hearing Screening   125Hz  250Hz  500Hz  1000Hz  2000Hz  3000Hz  4000Hz  6000Hz  8000Hz   Right ear:   0 0 0  0    Left ear:   40 40 40  40      Patient concerns:   Shoulder pain - intermittent, bilateral, onset: approx. 1 yr ago, pain scale: 8/10.  Nurse concerns:  None  Next PCP appt:   12/13/16 @ 0830

## 2016-12-09 NOTE — Patient Instructions (Signed)
Alexandria Leach , Thank you for taking time to come for your Medicare Wellness Visit. I appreciate your ongoing commitment to your health goals. Please review the following plan we discussed and let me know if I can assist you in the future.   These are the goals we discussed: Goals    . Increase physical activity          Starting 12/09/2016, I will continue to walk at least 60 min day/4 days per week.        This is a list of the screening recommended for you and due dates:  Health Maintenance  Topic Date Due  . DTaP/Tdap/Td vaccine (1 - Tdap) 11/29/2025*  . Flu Shot  11/29/2025*  . Tetanus Vaccine  11/29/2025*  . Mammogram  08/17/2018  . Colon Cancer Screening  07/26/2024  .  Hepatitis C: One time screening is recommended by Center for Disease Control  (CDC) for  adults born from [birth year] through 1963.   Completed  . HIV Screening  Completed  *Topic was postponed. The date shown is not the original due date.   Preventive Care for Adults  A healthy lifestyle and preventive care can promote health and wellness. Preventive health guidelines for adults include the following key practices.  . A routine yearly physical is a good way to check with your health care provider about your health and preventive screening. It is a chance to share any concerns and updates on your health and to receive a thorough exam.  . Visit your dentist for a routine exam and preventive care every 6 months. Brush your teeth twice a day and floss once a day. Good oral hygiene prevents tooth decay and gum disease.  . The frequency of eye exams is based on your age, health, family medical history, use  of contact lenses, and other factors. Follow your health care provider's ecommendations for frequency of eye exams.  . Eat a healthy diet. Foods like vegetables, fruits, whole grains, low-fat dairy products, and lean protein foods contain the nutrients you need without too many calories. Decrease your intake of foods  high in solid fats, added sugars, and salt. Eat the right amount of calories for you. Get information about a proper diet from your health care provider, if necessary.  . Regular physical exercise is one of the most important things you can do for your health. Most adults should get at least 150 minutes of moderate-intensity exercise (any activity that increases your heart rate and causes you to sweat) each week. In addition, most adults need muscle-strengthening exercises on 2 or more days a week.  Silver Sneakers may be a benefit available to you. To determine eligibility, you may visit the website: www.silversneakers.com or contact program at (417) 086-7219 Mon-Fri between 8AM-8PM.   . Maintain a healthy weight. The body mass index (BMI) is a screening tool to identify possible weight problems. It provides an estimate of body fat based on height and weight. Your health care provider can find your BMI and can help you achieve or maintain a healthy weight.   For adults 20 years and older: ? A BMI below 18.5 is considered underweight. ? A BMI of 18.5 to 24.9 is normal. ? A BMI of 25 to 29.9 is considered overweight. ? A BMI of 30 and above is considered obese.   . Maintain normal blood lipids and cholesterol levels by exercising and minimizing your intake of saturated fat. Eat a balanced diet with plenty of fruit and  vegetables. Blood tests for lipids and cholesterol should begin at age 16 and be repeated every 5 years. If your lipid or cholesterol levels are high, you are over 50, or you are at high risk for heart disease, you may need your cholesterol levels checked more frequently. Ongoing high lipid and cholesterol levels should be treated with medicines if diet and exercise are not working.  . If you smoke, find out from your health care provider how to quit. If you do not use tobacco, please do not start.  . If you choose to drink alcohol, please do not consume more than 2 drinks per day.  One drink is considered to be 12 ounces (355 mL) of beer, 5 ounces (148 mL) of wine, or 1.5 ounces (44 mL) of liquor.  . If you are 101-53 years old, ask your health care provider if you should take aspirin to prevent strokes.  . Use sunscreen. Apply sunscreen liberally and repeatedly throughout the day. You should seek shade when your shadow is shorter than you. Protect yourself by wearing long sleeves, pants, a wide-brimmed hat, and sunglasses year round, whenever you are outdoors.  . Once a month, do a whole body skin exam, using a mirror to look at the skin on your back. Tell your health care provider of new moles, moles that have irregular borders, moles that are larger than a pencil eraser, or moles that have changed in shape or color.

## 2016-12-09 NOTE — Progress Notes (Signed)
Subjective:   Alexandria Leach is a 66 y.o. female who presents for Medicare Annual (Subsequent) preventive examination.  Review of Systems:  N/A Cardiac Risk Factors include: dyslipidemia;hypertension     Objective:     Vitals: BP 108/64 (BP Location: Right Arm, Patient Position: Sitting, Cuff Size: Normal)   Pulse 66   Temp 98 F (36.7 C) (Oral)   Ht 5\' 2"  (1.575 m) Comment: no shoes  Wt 175 lb 8 oz (79.6 kg)   SpO2 98%   BMI 32.10 kg/m   Body mass index is 32.1 kg/m.   Tobacco History  Smoking Status  . Never Smoker  Smokeless Tobacco  . Never Used     Counseling given: No   Past Medical History:  Diagnosis Date  . Ankle fracture, left 02/23/2015  . Ankle fracture, left 02/19/2015   from a fall  . Anxiety   . CHF (congestive heart failure) ([place])   . CKD (chronic kidney disease) stage 3, GFR 30-59 ml/min    saw nephrologist Dr Harden Mo  . DDD (degenerative disc disease), cervical   . Depression   . Fibromyalgia   . Glaucoma    s/p surgery, sees ophtho Q6 mo  . 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 History of kidney stones 2010  . History of pulmonary embolism 2001, 2006   completed coumadin courses  . History of rheumatic fever x3  . HLD (hyperlipidemia)   . HTN (hypertension)   . Insomnia   . Lung nodule 09/22/2012   RLL - 56mm, stable since 2014. Thought benign.   . Osteoarthritis    shoulders and knees, not RA per Dr Estanislado Pandy, positive ANA, positive Ro  . Osteopenia 09/18/2015   DEXA T -1.1 hip, -0.2 spine 08/2015   . Personal history of urinary calculi latest 2014  . Pneumonia 12/02/2011  . PONV (postoperative nausea and vomiting)   . Refusal of blood transfusions as patient is Jehovah's Witness   . Rheumatic heart disease 1980   s/p mitral valve repair 1980  . Sjogren's syndrome [facility])    Past Surgical History:  Procedure Laterality Date  . BREAST BIOPSY Right 2006   benign    . CHOLECYSTECTOMY  11/27/2011   Procedure: LAPAROSCOPIC CHOLECYSTECTOMY WITH INTRAOPERATIVE CHOLANGIOGRAM;  Surgeon: Adin Hector, MD;  Location: [place];  Service: General;  Laterality: N/A;  laparoscopic cholecystectomy with choleangiogram umbilical hernia repair  . COLONOSCOPY  07/2014   WNL Amedeo Plenty)  . dexa  08/2012   normal per patient - no records available  . MITRAL VALVE REPAIR  1980   open heart  . ORIF ANKLE FRACTURE Left 02/26/2015   Procedure: OPEN REDUCTION INTERNAL FIXATION (ORIF) LEFT TRIMALLEOLAR ANKLE FRACTURE;  Surgeon: [name], MD;  Location: [place];  Service: Orthopedics;  Laterality: Left;  . TUBAL LIGATION  1980  . UMBILICAL HERNIA REPAIR  11/27/2011   Procedure: HERNIA REPAIR UMBILICAL ADULT;  Surgeon: Adin Hector, MD;  Location: [place];  Service: General;  Laterality: N/A;  . [place]   for fibroids -- partial, ovaries remain   Family History  Problem Relation Age of Onset  . Lupus Sister        and niece  . Cancer Mother        lung (nonsmoker)  . CAD Mother        MI in her 49s  . ALS Mother   .  Kidney disease Father   . Alcohol abuse Father   . Diabetes Father   . Cancer Brother        bone  . Diabetes Sister   . Stroke Sister   . Cancer Maternal Uncle        bone  . Depression Sister   . Kidney failure Other        on HD  . Diabetes Brother   . Heart attack Brother   . Stroke Maternal Grandmother    History  Sexual Activity  . Sexual activity: Not Currently  . Birth control/ protection: Surgical    Outpatient Encounter Prescriptions as of 12/09/2016  Medication Sig  . ALPRAZolam (XANAX) 1 MG tablet Take 1 tablet (1 mg total) by mouth 3 (three) times daily as needed for anxiety.  Marland Kitchen amLODipine (NORVASC) 10 MG tablet Take 1 tablet (10 mg total) by mouth daily.  Marland Kitchen antiseptic oral rinse (BIOTENE) LIQD 15 mLs by Mouth Rinse route at bedtime.   Marland Kitchen aspirin 325 MG EC tablet Take 325 mg by mouth daily.   . cholecalciferol  (VITAMIN D) 1000 UNITS tablet Take 1,000 Units by mouth daily.   . diclofenac sodium (VOLTAREN) 1 % GEL Apply 1 application topically 2 (two) times daily as needed (pain).  Marland Kitchen escitalopram (LEXAPRO) 20 MG tablet Take 1 tablet (20 mg total) by mouth 2 (two) times daily.  Marland Kitchen ezetimibe (ZETIA) 10 MG tablet Take 1 tablet (10 mg total) by mouth at bedtime.  . famotidine (PEPCID) 20 MG tablet TAKE 1 TABLET BY MOUTH TWO  TIMES DAILY  . furosemide (LASIX) 20 MG tablet TAKE 1 TABLET BY MOUTH  DAILY AS NEEDED FOR EDEMA  . gabapentin (NEURONTIN) 300 MG capsule Take 2 capsules (600 mg total) by mouth 3 (three) times daily. Take 2 capsules every morning, 1 capsule every afternoon and 2 capsules at bedtime.  . hydrochlorothiazide (MICROZIDE) 12.5 MG capsule TAKE 1 CAPSULE BY MOUTH  DAILY  . lovastatin (MEVACOR) 10 MG tablet Take 1 tablet (10 mg total) by mouth every Monday, Wednesday, and Friday.  . metoprolol succinate (TOPROL-XL) 50 MG 24 hr tablet Take 1 tablet (50 mg total) by mouth 2 (two) times daily. Take with or immediately following a meal  . Polyethyl Glycol-Propyl Glycol (SYSTANE) 0.4-0.3 % GEL Place 1 drop into both eyes 2 (two) times daily.   . RESTASIS 0.05 % ophthalmic emulsion INSTILL ONE DROP IN BOTH  EYES TWO TIMES DAILY  . traMADol (ULTRAM) 50 MG tablet Take 1 tablet (50 mg total) by mouth 3 (three) times daily.  . [DISCONTINUED] metoprolol succinate (TOPROL-XL) 50 MG 24 hr tablet Take 1 tablet by mouth 2 (two) times daily.  Marland Kitchen zolpidem (AMBIEN) 10 MG tablet Take 1 tablet (10 mg total) by mouth at bedtime as needed for sleep.  . [DISCONTINUED] ciprofloxacin (CIPRO) 500 MG tablet Take 1 tablet (500 mg total) by mouth every 12 (twelve) hours.  . [DISCONTINUED] hydroxychloroquine (PLAQUENIL) 200 MG tablet Take 1 Tablet by mouth once a day Monday through Friday   No facility-administered encounter medications on file as of 12/09/2016.     Activities of Daily Living In your present state of health,  do you have any difficulty performing the following activities: 12/09/2016  Hearing? 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 N  Difficulty concentrating or making decisions? Y  Walking or climbing stairs? Y  Dressing or bathing? N  Doing errands, shopping? N  Preparing Food and eating ?  N  Using the Toilet? N  In the past six months, have you accidently leaked urine? N  Do you have problems with loss of bowel control? N  Managing your Medications? N  Managing your Finances? N  Housekeeping or managing your Housekeeping? N  Some recent data might be hidden    Patient Care Team: Ria Bush, MD as PCP - General (Family Medicine) Stanford Breed, [name], MD as Consulting Physician (Cardiology) Bo Merino, MD as Consulting Physician (Rheumatology) Cloria Spring, MD as Consulting Physician (Behavioral Health) Sharyne Peach, MD as Consulting Physician (Ophthalmology) Penni Bombard, MD as Consulting Physician (Neurology)    Assessment:     Hearing Screening   125Hz  250Hz  500Hz  1000Hz  2000Hz  3000Hz  4000Hz  6000Hz  8000Hz   Right ear:   0 0 0  0    Left ear:   40 40 40  40    Vision Screening Comments: Last vision exam in May 2018 with Dr. Delman Cheadle   Exercise Activities and Dietary recommendations Current Exercise Habits: Home exercise routine, Type of exercise: treadmill, Time (Minutes): 60, Frequency (Times/Week): 4, Weekly Exercise (Minutes/Week): 240, Intensity: Moderate, Exercise limited by: None identified  Goals    . Increase physical activity          Starting 12/09/2016, I will continue to walk at least 60 min day/4 days per week.       Fall Risk Fall Risk  12/09/2016 11/30/2015 11/30/2015 11/07/2015 02/22/2015  Falls in the past year? No - Yes Yes No  Comment - - - - none in past 3 mos  Number falls in past yr: - - 2 or more 2 or more -  Comment - - - - -  Injury with Fall? - - Yes Yes -  Comment - - - left ankle fx -  Risk Factor Category  - (No  Data) High Fall Risk - -  Comment - pt reports she has had 11 falls between 2016 and 2017 - - -  Risk for fall due to : - - - Impaired mobility;Impaired balance/gait -  Follow up - - Falls evaluation completed;Education provided;Falls prevention discussed - -   Depression Screen PHQ 2/9 Scores 12/09/2016 11/30/2015 10/18/2014  PHQ - 2 Score 2 4 3   PHQ- 9 Score 4 13 9      Cognitive Function MMSE - Mini Mental State Exam 12/09/2016 11/30/2015  Orientation to time 5 5  Orientation to Place 5 5  Registration 3 3  Attention/ Calculation 0 0  Recall 2 1  Recall-comments unable to recall 1 of 3 words pt was unable to recall 2 of 3 words  Language- name 2 objects 0 0  Language- repeat 1 1  Language- follow 3 step command 3 3  Language- read & follow direction 0 0  Write a sentence 0 0  Copy design 0 0  Total score 19 18       PLEASE NOTE: A Mini-Cog screen was completed. Maximum score is 20. A value of 0 denotes this part of Folstein MMSE was not completed or the patient failed this part of the Mini-Cog screening.   Mini-Cog Screening Orientation to Time - Max 5 pts Orientation to Place - Max 5 pts Registration - Max 3 pts Recall - Max 3 pts Language Repeat - Max 1 pts Language Follow 3 Step Command - Max 3 pts   Immunization History  Administered Date(s) Administered  . Pneumococcal Polysaccharide-23 09/15/2012   Screening Tests Health Maintenance  Topic Date Due  .  DTaP/Tdap/Td (1 - Tdap) 11/29/2025 (Originally 07/25/1969)  . INFLUENZA VACCINE  11/29/2025 (Originally 10/16/2016)  . TETANUS/TDAP  11/29/2025 (Originally 07/25/1971)  . MAMMOGRAM  08/17/2018  . COLONOSCOPY  07/26/2024  . Hepatitis C Screening  Completed  . HIV Screening  Completed      Plan:     I have personally reviewed and addressed the Medicare Annual Wellness questionnaire and have noted the following in the patient's chart:  A. Medical and social history B. Use of alcohol, tobacco or illicit drugs   C. Current medications and supplements D. Functional ability and status E.  Nutritional status F.  Physical activity G. Advance directives H. List of other physicians I.  Hospitalizations, surgeries, and ER visits in previous 12 months J.  [place] to include hearing, vision, cognitive, depression L. Referrals and appointments - none  In addition, I have reviewed and discussed with patient certain preventive protocols, quality metrics, and best practice recommendations. A written personalized care plan for preventive services as well as general preventive health recommendations were provided to patient.  See attached scanned questionnaire for additional information.   Signed,   Lindell Noe, MHA, BS, LPN Health Coach

## 2016-12-10 NOTE — Progress Notes (Signed)
I reviewed health advisor's note, was available for consultation, and agree with documentation and plan.  

## 2016-12-13 ENCOUNTER — Encounter: Payer: Self-pay | Admitting: Family Medicine

## 2016-12-13 ENCOUNTER — Ambulatory Visit (INDEPENDENT_AMBULATORY_CARE_PROVIDER_SITE_OTHER): Payer: Medicare Other | Admitting: Family Medicine

## 2016-12-13 VITALS — BP 112/66 | HR 67 | Temp 98.0°F | Ht 62.0 in | Wt 176.0 lb

## 2016-12-13 DIAGNOSIS — I10 Essential (primary) hypertension: Secondary | ICD-10-CM

## 2016-12-13 DIAGNOSIS — Z66 Do not resuscitate: Secondary | ICD-10-CM

## 2016-12-13 DIAGNOSIS — M8589 Other specified disorders of bone density and structure, multiple sites: Secondary | ICD-10-CM | POA: Diagnosis not present

## 2016-12-13 DIAGNOSIS — Z7189 Other specified counseling: Secondary | ICD-10-CM | POA: Diagnosis not present

## 2016-12-13 DIAGNOSIS — Z0000 Encounter for general adult medical examination without abnormal findings: Secondary | ICD-10-CM | POA: Diagnosis not present

## 2016-12-13 DIAGNOSIS — N183 Chronic kidney disease, stage 3 unspecified: Secondary | ICD-10-CM

## 2016-12-13 DIAGNOSIS — E7800 Pure hypercholesterolemia, unspecified: Secondary | ICD-10-CM

## 2016-12-13 DIAGNOSIS — F339 Major depressive disorder, recurrent, unspecified: Secondary | ICD-10-CM

## 2016-12-13 DIAGNOSIS — R7401 Elevation of levels of liver transaminase levels: Secondary | ICD-10-CM

## 2016-12-13 DIAGNOSIS — R740 Nonspecific elevation of levels of transaminase and lactic acid dehydrogenase [LDH]: Secondary | ICD-10-CM | POA: Diagnosis not present

## 2016-12-13 DIAGNOSIS — M3500 Sicca syndrome, unspecified: Secondary | ICD-10-CM | POA: Diagnosis not present

## 2016-12-13 NOTE — Progress Notes (Signed)
BP 112/66 (BP Location: Left Arm, Patient Position: Sitting, Cuff Size: Normal)   Pulse 67   Temp 98 F (36.7 C) (Oral)   Ht 5\' 2"  (1.575 m)   Wt 176 lb (79.8 kg)   SpO2 98%   BMI 32.19 kg/m    CC: CPE Subjective:    Patient ID: Alexandria Leach, female    DOB: 15-Feb-1951, 66 y.o.   MRN: [id number]  HPI: Alexandria Leach is a 66 y.o. female presenting on 12/13/2016 for Annual Exam (Pt 2)   Saw Katha Cabal earlier this week for medicare wellness visit. Note reviewed. Failed hearing screen out of right ear, passed left. S/p audiology eval last year - complete R hearing loss, hearing aides wouldn't help.   Ongoing bilateral shoulder pain. She recently stopped plaquenil due to concern for serotonin syndrome given concomittant lexapro/tramadol use.   Recently seen at [facility] for cystitis, treated with keflex --> changed to cipro. UCx no growth.   Sjogren's syndrome followed by rheumatology Dr Estanislado Pandy. F/u scheduled in 2 wks.   Preventative: COLONOSCOPY 07/2014; WNL Amedeo Plenty) Breast cancer screening - birads1 mammogram 08/2016 Well woman exam - s/p hysterectomy, ovaries remain. Pelvic exam normal 2017.  Lung cancer screening - never smoker  DEXA T -1.1 hip, -0.2 spine 08/2015 Flu shot - declines Tetanus shot - will check with insurance  Pneumovax 2014 shingrix - declines Advanced directive discussion - brought forms last week. Son Alexandria Leach is HCPOA. Clear she does not want CPR, no intubation. Has DNR form at home.  Seat belt use discussed Sunscreen use and skin screen discussed  Non smoker Alcohol - none  Lives with son, 1 dog Occupation: unemployed, on disability for fibromyalgia since 2008. Edu: HS Religion: Jehova's witness Activity: volunteers at senior center Diet: some water, fruits/vegetables daily No caffeine use  Relevant past medical, surgical, family and social history reviewed and updated as indicated. Interim medical history since our last visit  reviewed. Allergies and medications reviewed and updated. Outpatient Medications Prior to Visit  Medication Sig Dispense Refill  . ALPRAZolam (XANAX) 1 MG tablet Take 1 tablet (1 mg total) by mouth 3 (three) times daily as needed for anxiety. 270 tablet 1  . amLODipine (NORVASC) 10 MG tablet Take 1 tablet (10 mg total) by mouth daily. 90 tablet 3  . antiseptic oral rinse (BIOTENE) LIQD 15 mLs by Mouth Rinse route at bedtime.     Marland Kitchen aspirin 325 MG EC tablet Take 325 mg by mouth daily.     . cholecalciferol (VITAMIN D) 1000 UNITS tablet Take 1,000 Units by mouth daily.     . diclofenac sodium (VOLTAREN) 1 % GEL Apply 1 application topically 2 (two) times daily as needed (pain).    Marland Kitchen escitalopram (LEXAPRO) 20 MG tablet Take 1 tablet (20 mg total) by mouth 2 (two) times daily. 180 tablet 2  . ezetimibe (ZETIA) 10 MG tablet Take 1 tablet (10 mg total) by mouth at bedtime. 90 tablet 1  . famotidine (PEPCID) 20 MG tablet TAKE 1 TABLET BY MOUTH TWO  TIMES DAILY 180 tablet 3  . furosemide (LASIX) 20 MG tablet TAKE 1 TABLET BY MOUTH  DAILY AS NEEDED FOR EDEMA 90 tablet 3  . gabapentin (NEURONTIN) 300 MG capsule Take 2 capsules (600 mg total) by mouth 3 (three) times daily. Take 2 capsules every morning, 1 capsule every afternoon and 2 capsules at bedtime.    . hydrochlorothiazide (MICROZIDE) 12.5 MG capsule TAKE 1 CAPSULE BY MOUTH  DAILY  90 capsule 0  . lovastatin (MEVACOR) 10 MG tablet Take 1 tablet (10 mg total) by mouth every Monday, Wednesday, and Friday. 70 tablet 1  . metoprolol succinate (TOPROL-XL) 50 MG 24 hr tablet Take 1 tablet (50 mg total) by mouth 2 (two) times daily. Take with or immediately following a meal 180 tablet 1  . Polyethyl Glycol-Propyl Glycol (SYSTANE) 0.4-0.3 % GEL Place 1 drop into both eyes 2 (two) times daily.     . RESTASIS 0.05 % ophthalmic emulsion INSTILL ONE DROP IN BOTH  EYES TWO TIMES DAILY 180 each 0  . traMADol (ULTRAM) 50 MG tablet Take 1 tablet (50 mg total) by  mouth 3 (three) times daily. 90 tablet 3  . zolpidem (AMBIEN) 10 MG tablet Take 1 tablet (10 mg total) by mouth at bedtime as needed for sleep. 30 tablet 2   No facility-administered medications prior to visit.      Per HPI unless specifically indicated in ROS section below Review of Systems  Constitutional: Positive for fever (100.3 earlier in the week). Negative for activity change, appetite change, chills, fatigue and unexpected weight change.  HENT: Positive for congestion. Negative for hearing loss.   Eyes: Negative for visual disturbance.  Respiratory: Negative for cough, chest tightness, shortness of breath and wheezing.   Cardiovascular: Negative for chest pain, palpitations and leg swelling.  Gastrointestinal: Negative for abdominal distention, abdominal pain, blood in stool, constipation, diarrhea, nausea and vomiting.  Genitourinary: Negative for difficulty urinating and hematuria.  Musculoskeletal: Positive for neck pain (from sjogren). Negative for arthralgias and myalgias.  Skin: Negative for rash.  Neurological: Negative for dizziness, seizures, syncope and headaches.  Hematological: Negative for adenopathy. Does not bruise/bleed easily.  Psychiatric/Behavioral: Negative for dysphoric mood. The patient is not nervous/anxious.        Objective:    BP 112/66 (BP Location: Left Arm, Patient Position: Sitting, Cuff Size: Normal)   Pulse 67   Temp 98 F (36.7 C) (Oral)   Ht 5\' 2"  (1.575 m)   Wt 176 lb (79.8 kg)   SpO2 98%   BMI 32.19 kg/m   Wt Readings from Last 3 Encounters:  12/13/16 176 lb (79.8 kg)  12/09/16 175 lb 8 oz (79.6 kg)  11/14/16 183 lb (83 kg)    Physical Exam  Constitutional: She is oriented to person, place, and time. She appears well-developed and well-nourished. No distress.  HENT:  Head: Normocephalic and atraumatic.  Right Ear: Hearing, tympanic membrane, external ear and ear canal normal.  Left Ear: Hearing, tympanic membrane, external ear  and ear canal normal.  Nose: Nose normal.  Mouth/Throat: Uvula is midline, oropharynx is clear and moist and mucous membranes are normal. No oropharyngeal exudate, posterior oropharyngeal edema or posterior oropharyngeal erythema.  Eyes: Pupils are equal, round, and reactive to light. Conjunctivae and EOM are normal. No scleral icterus.  Neck: Normal range of motion. Neck supple. No thyromegaly present.  Cardiovascular: Normal rate, regular rhythm, normal heart sounds and intact distal pulses.   No murmur heard. Pulses:      Radial pulses are 2+ on the right side, and 2+ on the left side.  Pulmonary/Chest: Effort normal and breath sounds normal. No respiratory distress. She has no wheezes. She has no rales.  Abdominal: Soft. Bowel sounds are normal. She exhibits no distension and no mass. There is no tenderness. There is no rebound and no guarding.  Musculoskeletal: Normal range of motion. She exhibits no edema.  Lymphadenopathy:    She  has no cervical adenopathy.  Neurological: She is alert and oriented to person, place, and time.  CN grossly intact, station and gait intact  Skin: Skin is warm and dry. No rash noted.  Psychiatric: She has a normal mood and affect. Her behavior is normal. Judgment and thought content normal.  Nursing note and vitals reviewed.  Results for orders placed or performed in visit on 12/09/16  Renal function panel  Result Value Ref Range   Sodium 143 135 - 145 mEq/L   Potassium 3.8 3.5 - 5.1 mEq/L   Chloride 108 96 - 112 mEq/L   CO2 26 19 - 32 mEq/L   Calcium 10.0 8.4 - 10.5 mg/dL   Albumin 4.2 3.5 - 5.2 g/dL   BUN 9 6 - 23 mg/dL   Creatinine, Ser 1.10 0.40 - 1.20 mg/dL   Glucose, Bld 101 (H) 70 - 99 mg/dL   Phosphorus 2.9 2.3 - 4.6 mg/dL   GFR 64.23 >60.00 mL/min  LDL Cholesterol, Direct  Result Value Ref Range   Direct LDL 77.0 mg/dL   Lab Results  Component Value Date   ALT 14 10/29/2016   AST 26 10/29/2016   ALKPHOS 50 10/29/2016   BILITOT 0.4  10/29/2016      Assessment & Plan:   Problem List Items Addressed This Visit    Advanced care planning/counseling discussion    Advanced directive discussion - brought forms last week. Son Alexandria Leach is HCPOA. Clear she does not want CPR, no intubation. Has DNR form at home.       CKD (chronic kidney disease) stage 3, GFR 30-59 ml/min    Stable to improved.       DNR no code (do not resuscitate)    Has DNR form at home.       Essential hypertension    Chronic, stable. Continue current regimen.       Health maintenance examination - Primary    Preventative protocols reviewed and updated unless pt declined. Discussed healthy diet and lifestyle.       HYPERCHOLESTEROLEMIA    Chronic, LDL stable on lovastatin. Continue.  The 10-year ASCVD risk score Mikey Bussing DC Brooke Bonito., et al., 2013) is: 5.6%   Values used to calculate the score:     Age: 30 years     Sex: Female     Is Non-Hispanic African American: Yes     Diabetic: No     Tobacco smoker: No     Systolic Blood Pressure: 244 mmHg     Is BP treated: Yes     HDL Cholesterol: 88.4 mg/dL     Total Cholesterol: 208 mg/dL       MDD (major depressive disorder), recurrent episode ([place])    Appreciate psych care.      Osteopenia   Sjogren's syndrome ([place])    Followed by rheum      Transaminitis    This seems resolved          Follow up plan: Return in about 6 months (around 06/12/2017) for follow up visit.  Ria Bush, MD

## 2016-12-13 NOTE — Assessment & Plan Note (Signed)
Chronic, stable. Continue current regimen. 

## 2016-12-13 NOTE — Assessment & Plan Note (Signed)
Chronic, LDL stable on lovastatin. Continue.  The 10-year ASCVD risk score Mikey Bussing DC Brooke Bonito., et al., 2013) is: 5.6%   Values used to calculate the score:     Age: 66 years     Sex: Female     Is Non-Hispanic African American: Yes     Diabetic: No     Tobacco smoker: No     Systolic Blood Pressure: 076 mmHg     Is BP treated: Yes     HDL Cholesterol: 88.4 mg/dL     Total Cholesterol: 208 mg/dL

## 2016-12-13 NOTE — Assessment & Plan Note (Signed)
Advanced directive discussion - brought forms last week. Son Rodney Langi is HCPOA. Clear she does not want CPR, no intubation. Has DNR form at home.  

## 2016-12-13 NOTE — Assessment & Plan Note (Signed)
Followed by rheum. 

## 2016-12-13 NOTE — Patient Instructions (Addendum)
You are doing well today. Return as needed or in 6 months for follow up visit.   Health Maintenance, Female Adopting a healthy lifestyle and getting preventive care can go a long way to promote health and wellness. Talk with your health care provider about what schedule of regular examinations is right for you. This is a good chance for you to check in with your provider about disease prevention and staying healthy. In between checkups, there are plenty of things you can do on your own. Experts have done a lot of research about which lifestyle changes and preventive measures are most likely to keep you healthy. Ask your health care provider for more information. Weight and diet Eat a healthy diet  Be sure to include plenty of vegetables, fruits, low-fat dairy products, and lean protein.  Do not eat a lot of foods high in solid fats, added sugars, or salt.  Get regular exercise. This is one of the most important things you can do for your health. ? Most adults should exercise for at least 150 minutes each week. The exercise should increase your heart rate and make you sweat (moderate-intensity exercise). ? Most adults should also do strengthening exercises at least twice a week. This is in addition to the moderate-intensity exercise.  Maintain a healthy weight  Body mass index (BMI) is a measurement that can be used to identify possible weight problems. It estimates body fat based on height and weight. Your health care provider can help determine your BMI and help you achieve or maintain a healthy weight.  For females 89 years of age and older: ? A BMI below 18.5 is considered underweight. ? A BMI of 18.5 to 24.9 is normal. ? A BMI of 25 to 29.9 is considered overweight. ? A BMI of 30 and above is considered obese.  Watch levels of cholesterol and blood lipids  You should start having your blood tested for lipids and cholesterol at 64 years of age, then have this test every 5  years.  You may need to have your cholesterol levels checked more often if: ? Your lipid or cholesterol levels are high. ? You are older than 64 years of age. ? You are at high risk for heart disease.  Cancer screening Lung Cancer  Lung cancer screening is recommended for adults 31-82 years old who are at high risk for lung cancer because of a history of smoking.  A yearly low-dose CT scan of the lungs is recommended for people who: ? Currently smoke. ? Have quit within the past 15 years. ? Have at least a 30-pack-year history of smoking. A pack year is smoking an average of one pack of cigarettes a day for 1 year.  Yearly screening should continue until it has been 15 years since you quit.  Yearly screening should stop if you develop a health problem that would prevent you from having lung cancer treatment.  Breast Cancer  Practice breast self-awareness. This means understanding how your breasts normally appear and feel.  It also means doing regular breast self-exams. Let your health care provider know about any changes, no matter how small.  If you are in your 20s or 30s, you should have a clinical breast exam (CBE) by a health care provider every 1-3 years as part of a regular health exam.  If you are 27 or older, have a CBE every year. Also consider having a breast X-ray (mammogram) every year.  If you have a family history of  breast cancer, talk to your health care provider about genetic screening.  If you are at high risk for breast cancer, talk to your health care provider about having an MRI and a mammogram every year.  Breast cancer gene (BRCA) assessment is recommended for women who have family members with BRCA-related cancers. BRCA-related cancers include: ? Breast. ? Ovarian. ? Tubal. ? Peritoneal cancers.  Results of the assessment will determine the need for genetic counseling and BRCA1 and BRCA2 testing.  Cervical Cancer Your health care provider may  recommend that you be screened regularly for cancer of the pelvic organs (ovaries, uterus, and vagina). This screening involves a pelvic examination, including checking for microscopic changes to the surface of your cervix (Pap test). You may be encouraged to have this screening done every 3 years, beginning at age 21.  For women ages 30-65, health care providers may recommend pelvic exams and Pap testing every 3 years, or they may recommend the Pap and pelvic exam, combined with testing for human papilloma virus (HPV), every 5 years. Some types of HPV increase your risk of cervical cancer. Testing for HPV may also be done on women of any age with unclear Pap test results.  Other health care providers may not recommend any screening for nonpregnant women who are considered low risk for pelvic cancer and who do not have symptoms. Ask your health care provider if a screening pelvic exam is right for you.  If you have had past treatment for cervical cancer or a condition that could lead to cancer, you need Pap tests and screening for cancer for at least 20 years after your treatment. If Pap tests have been discontinued, your risk factors (such as having a new sexual partner) need to be reassessed to determine if screening should resume. Some women have medical problems that increase the chance of getting cervical cancer. In these cases, your health care provider may recommend more frequent screening and Pap tests.  Colorectal Cancer  This type of cancer can be detected and often prevented.  Routine colorectal cancer screening usually begins at 64 years of age and continues through 64 years of age.  Your health care provider may recommend screening at an earlier age if you have risk factors for colon cancer.  Your health care provider may also recommend using home test kits to check for hidden blood in the stool.  A small camera at the end of a tube can be used to examine your colon directly  (sigmoidoscopy or colonoscopy). This is done to check for the earliest forms of colorectal cancer.  Routine screening usually begins at age 50.  Direct examination of the colon should be repeated every 5-10 years through 64 years of age. However, you may need to be screened more often if early forms of precancerous polyps or small growths are found.  Skin Cancer  Check your skin from head to toe regularly.  Tell your health care provider about any new moles or changes in moles, especially if there is a change in a mole's shape or color.  Also tell your health care provider if you have a mole that is larger than the size of a pencil eraser.  Always use sunscreen. Apply sunscreen liberally and repeatedly throughout the day.  Protect yourself by wearing long sleeves, pants, a wide-brimmed hat, and sunglasses whenever you are outside.  Heart disease, diabetes, and high blood pressure  High blood pressure causes heart disease and increases the risk of stroke. High blood   pressure is more likely to develop in: ? People who have blood pressure in the high end of the normal range (130-139/85-89 mm Hg). ? People who are overweight or obese. ? People who are African American.  If you are 60-67 years of age, have your blood pressure checked every 3-5 years. If you are 95 years of age or older, have your blood pressure checked every year. You should have your blood pressure measured twice-once when you are at a hospital or clinic, and once when you are not at a hospital or clinic. Record the average of the two measurements. To check your blood pressure when you are not at a hospital or clinic, you can use: ? An automated blood pressure machine at a pharmacy. ? A home blood pressure monitor.  If you are between 59 years and 32 years old, ask your health care provider if you should take aspirin to prevent strokes.  Have regular diabetes screenings. This involves taking a blood sample to check your  fasting blood sugar level. ? If you are at a normal weight and have a low risk for diabetes, have this test once every three years after 64 years of age. ? If you are overweight and have a high risk for diabetes, consider being tested at a younger age or more often. Preventing infection Hepatitis B  If you have a higher risk for hepatitis B, you should be screened for this virus. You are considered at high risk for hepatitis B if: ? You were born in a country where hepatitis B is common. Ask your health care provider which countries are considered high risk. ? Your parents were born in a high-risk country, and you have not been immunized against hepatitis B (hepatitis B vaccine). ? You have HIV or AIDS. ? You use needles to inject street drugs. ? You live with someone who has hepatitis B. ? You have had sex with someone who has hepatitis B. ? You get hemodialysis treatment. ? You take certain medicines for conditions, including cancer, organ transplantation, and autoimmune conditions.  Hepatitis C  Blood testing is recommended for: ? Everyone born from [birth year] through 1963. ? Anyone with known risk factors for hepatitis C.  Sexually transmitted infections (STIs)  You should be screened for sexually transmitted infections (STIs) including gonorrhea and chlamydia if: ? You are sexually active and are younger than 64 years of age. ? You are older than 64 years of age and your health care provider tells you that you are at risk for this type of infection. ? Your sexual activity has changed since you were last screened and you are at an increased risk for chlamydia or gonorrhea. Ask your health care provider if you are at risk.  If you do not have HIV, but are at risk, it may be recommended that you take a prescription medicine daily to prevent HIV infection. This is called pre-exposure prophylaxis (PrEP). You are considered at risk if: ? You are sexually active and do not regularly use condoms  or know the HIV status of your partner(s). ? You take drugs by injection. ? You are sexually active with a partner who has HIV.  Talk with your health care provider about whether you are at high risk of being infected with HIV. If you choose to begin PrEP, you should first be tested for HIV. You should then be tested every 3 months for as long as you are taking PrEP. Pregnancy  If you are premenopausal  and you may become pregnant, ask your health care provider about preconception counseling.  If you may become pregnant, take 400 to 800 micrograms (mcg) of folic acid every day.  If you want to prevent pregnancy, talk to your health care provider about birth control (contraception). Osteoporosis and menopause  Osteoporosis is a disease in which the bones lose minerals and strength with aging. This can result in serious bone fractures. Your risk for osteoporosis can be identified using a bone density scan.  If you are 83 years of age or older, or if you are at risk for osteoporosis and fractures, ask your health care provider if you should be screened.  Ask your health care provider whether you should take a calcium or vitamin D supplement to lower your risk for osteoporosis.  Menopause may have certain physical symptoms and risks.  Hormone replacement therapy may reduce some of these symptoms and risks. Talk to your health care provider about whether hormone replacement therapy is right for you. Follow these instructions at home:  Schedule regular health, dental, and eye exams.  Stay current with your immunizations.  Do not use any tobacco products including cigarettes, chewing tobacco, or electronic cigarettes.  If you are pregnant, do not drink alcohol.  If you are breastfeeding, limit how much and how often you drink alcohol.  Limit alcohol intake to no more than 1 drink per day for nonpregnant women. One drink equals 12 ounces of beer, 5 ounces of wine, or 1 ounces of hard  liquor.  Do not use street drugs.  Do not share needles.  Ask your health care provider for help if you need support or information about quitting drugs.  Tell your health care provider if you often feel depressed.  Tell your health care provider if you have ever been abused or do not feel safe at home. This information is not intended to replace advice given to you by your health care provider. Make sure you discuss any questions you have with your health care provider. Document Released: 09/17/2010 Document Revised: 08/10/2015 Document Reviewed: 12/06/2014 [employer]vier Interactive Patient Education  [employer].

## 2016-12-13 NOTE — Assessment & Plan Note (Signed)
Has DNR form at home.

## 2016-12-13 NOTE — Assessment & Plan Note (Signed)
This seems resolved.  

## 2016-12-13 NOTE — Assessment & Plan Note (Signed)
Preventative protocols reviewed and updated unless pt declined. Discussed healthy diet and lifestyle.  

## 2016-12-13 NOTE — Assessment & Plan Note (Signed)
Appreciate psych care.  ?

## 2016-12-13 NOTE — Assessment & Plan Note (Addendum)
Stable to improved.  

## 2016-12-26 ENCOUNTER — Other Ambulatory Visit: Payer: Self-pay | Admitting: Family Medicine

## 2016-12-30 ENCOUNTER — Ambulatory Visit (INDEPENDENT_AMBULATORY_CARE_PROVIDER_SITE_OTHER): Payer: Medicare Other | Admitting: Psychiatry

## 2016-12-30 ENCOUNTER — Encounter (HOSPITAL_COMMUNITY): Payer: Self-pay | Admitting: Psychiatry

## 2016-12-30 VITALS — BP 125/80 | HR 68 | Ht 62.0 in | Wt 184.4 lb

## 2016-12-30 DIAGNOSIS — F419 Anxiety disorder, unspecified: Secondary | ICD-10-CM

## 2016-12-30 DIAGNOSIS — M069 Rheumatoid arthritis, unspecified: Secondary | ICD-10-CM

## 2016-12-30 DIAGNOSIS — M797 Fibromyalgia: Secondary | ICD-10-CM

## 2016-12-30 DIAGNOSIS — F331 Major depressive disorder, recurrent, moderate: Secondary | ICD-10-CM

## 2016-12-30 DIAGNOSIS — Z79899 Other long term (current) drug therapy: Secondary | ICD-10-CM | POA: Diagnosis not present

## 2016-12-30 DIAGNOSIS — Z62810 Personal history of physical and sexual abuse in childhood: Secondary | ICD-10-CM

## 2016-12-30 DIAGNOSIS — Z818 Family history of other mental and behavioral disorders: Secondary | ICD-10-CM

## 2016-12-30 MED ORDER — ESCITALOPRAM OXALATE 20 MG PO TABS: 20.0000 mg | ORAL_TABLET | Freq: Two times a day (BID) | ORAL | 2 refills | Status: DC

## 2016-12-30 MED ORDER — ALPRAZOLAM 1 MG PO TABS: 1.0000 mg | ORAL_TABLET | Freq: Three times a day (TID) | ORAL | 1 refills | Status: DC | PRN

## 2016-12-30 MED ORDER — ZOLPIDEM TARTRATE 10 MG PO TABS: 10.0000 mg | ORAL_TABLET | Freq: Every evening | ORAL | 2 refills | Status: DC | PRN
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
## 2017-01-18 ENCOUNTER — Other Ambulatory Visit: Payer: Self-pay | Admitting: Family Medicine

## 2017-01-31 ENCOUNTER — Telehealth: Payer: Self-pay | Admitting: *Deleted

## 2017-01-31 NOTE — Telephone Encounter (Signed)
Received urgent fax from [facility] reporting the patient had a fall yesterday. Spoke with pt, she reports missing the chair, when going to sit down, sitting on the arm and then falling off. She denies syncope or other symptoms. She reports her hip is a little painful today but she is getting around fine. Patient voiced understanding to contact her medical doctor if she continues to have problems.

## 2017-02-03 ENCOUNTER — Ambulatory Visit (INDEPENDENT_AMBULATORY_CARE_PROVIDER_SITE_OTHER)
Admission: RE | Admit: 2017-02-03 | Discharge: 2017-02-03 | Disposition: A | Discharge status: Home / Self Care | Payer: Medicare Other | Source: Ambulatory Visit | Attending: Family Medicine | Admitting: Family Medicine

## 2017-02-03 ENCOUNTER — Encounter: Payer: Self-pay | Admitting: Family Medicine

## 2017-02-03 ENCOUNTER — Other Ambulatory Visit: Payer: Self-pay

## 2017-02-03 ENCOUNTER — Ambulatory Visit (INDEPENDENT_AMBULATORY_CARE_PROVIDER_SITE_OTHER): Payer: Medicare Other | Admitting: Family Medicine

## 2017-02-03 VITALS — BP 126/74 | HR 70 | Temp 98.5°F | Ht 62.0 in | Wt 182.2 lb

## 2017-02-03 DIAGNOSIS — S79912A Unspecified injury of left hip, initial encounter: Secondary | ICD-10-CM | POA: Diagnosis not present

## 2017-02-03 DIAGNOSIS — W19XXXA Unspecified fall, initial encounter: Secondary | ICD-10-CM

## 2017-02-03 DIAGNOSIS — M545 Low back pain, unspecified: Secondary | ICD-10-CM

## 2017-02-03 DIAGNOSIS — M25552 Pain in left hip: Secondary | ICD-10-CM

## 2017-02-03 NOTE — Progress Notes (Signed)
Dr. Frederico Hamman T. Jaleyah Longhi, MD, [facility] [place], [address] Phone: [phone] Fax: [phone]  02/03/2017  Patient: Alexandria Leach, MRN: [id number], DOB: [birth year], 66 y.o.  Primary Physician:  Ria Bush, MD   Chief Complaint  Patient presents with  . Fall    Last Wednesday  . Hip Pain    Left    Subjective:   Alexandria Leach is a 66 y.o. very pleasant female patient who presents with the following:  Last wed, hit the floor and has a bruise and trouble laying on her l hip at nihgt. Seems like getting worse. She is able to walk, but she is having quite a bit of pain in her sacral region as well as in her lumbar spine. She also is having pain in her left hip, more in the pelvic region. She denies groin pain. She is able to ambulate with a walker. She reports that she's had multiple falls.  Past Medical History, Surgical History, Social History, Family History, Problem List, Medications, and Allergies have been reviewed and updated if relevant.  Patient Active Problem List   Diagnosis Date Noted  . Lymphadenopathy of head and neck region 10/29/2016  . High risk medication use 06/05/2016  . Peripheral neuropathy 01/30/2016  . DNR no code (do not resuscitate) 12/08/2015  . 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 TIA (transient ischemic attack) 10/12/2015  . Abnormal urinalysis 10/12/2015  . Mild mitral regurgitation 10/12/2015  . Osteopenia 09/18/2015  . Right arm pain 08/29/2015  . Fall   . Trimalleolar fracture of left ankle 02/23/2015  . Right sided sciatica 02/21/2015  . Imbalance 01/12/2015  . Transaminitis 12/24/2014  . DDD (degenerative disc disease), cervical   . Health maintenance examination 10/18/2014  . Advanced care planning/counseling discussion 10/18/2014  . Medicare annual wellness visit, initial 10/18/2014  . Refusal of blood transfusions as patient is Jehovah's  Witness   . Sjogren's syndrome ([place])   . CKD (chronic kidney disease) stage 3, GFR 30-59 ml/min (HCC)   . Glaucoma   . Fibromyalgia   . Osteoarthritis   . History of pulmonary embolism   . Lung nodule 09/22/2012  . Diastolic CHF ([place]) 44/03/270  . Aortic stenosis 04/22/2011  . Palpitations 08/10/2010  . Dyspnea 08/10/2010  . HOT FLASHES 06/28/2008  . Trochanteric bursitis of right hip 06/28/2008  . ARTHRALGIA 07/01/2007  . BACK PAIN, LEFT 08/25/2006  . GAD (generalized anxiety disorder) 03/28/2006  . MITRAL VALVE REPLACEMENT, HX OF 03/28/2006  . TAH/BSO, HX OF 03/28/2006  . HYPERCHOLESTEROLEMIA 12/31/2005  . MDD (major depressive disorder), recurrent episode ([place]) 12/31/2005  . RHEUMATIC HEART DISEASE 12/31/2005  . Essential hypertension 12/31/2005  . Chronic insomnia 12/31/2005    Past Medical History:  Diagnosis Date  . Ankle fracture, left 02/23/2015  . Ankle fracture, left 02/19/2015   from a fall  . Anxiety   . CHF (congestive heart failure) ([place])   . CKD (chronic kidney disease) stage 3, GFR 30-59 ml/min [facility])    saw nephrologist Dr Harden Mo  . DDD (degenerative disc disease), cervical   . Depression   . Fibromyalgia   . Glaucoma    s/p surgery, sees ophtho Q6 mo  . 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 History of kidney stones 2010  . History of pulmonary embolism 2001, 2006   completed coumadin courses  .  History of rheumatic fever x3  . HLD (hyperlipidemia)   . HTN (hypertension)   . Insomnia   . Lung nodule 09/22/2012   RLL - 70mm, stable since 2014. Thought benign.   . Osteoarthritis    shoulders and knees, not RA per Dr Estanislado Pandy, positive ANA, positive Ro  . Osteopenia 09/18/2015   DEXA T -1.1 hip, -0.2 spine 08/2015   . Personal history of urinary calculi latest 2014  . Pneumonia 12/02/2011  . PONV (postoperative nausea and vomiting)   . Refusal of blood transfusions as patient is  Jehovah's Witness   . Rheumatic heart disease 1980   s/p mitral valve repair 1980  . Sjogren's syndrome [facility])     Past Surgical History:  Procedure Laterality Date  . BREAST BIOPSY Right 2006   benign  . COLONOSCOPY  07/2014   WNL [facility])  . dexa  08/2012   normal per patient - no records available  . HERNIA REPAIR UMBILICAL ADULT N/A 4/69/6295   Performed by Adin Hector, MD at [facility]  . LAPAROSCOPIC CHOLECYSTECTOMY WITH INTRAOPERATIVE CHOLANGIOGRAM N/A 11/27/2011   Performed by Adin Hector, MD at [facility]  . MITRAL VALVE REPAIR  1980   open heart  . OPEN REDUCTION INTERNAL FIXATION (ORIF) LEFT TRIMALLEOLAR ANKLE FRACTURE Left 02/26/2015   Performed by [name], MD at [facility]  . TUBAL LIGATION  1980  . VAGINAL HYSTERECTOMY  1992   for fibroids -- partial, ovaries remain    Social History   Socioeconomic History  . Marital status: Married    Spouse name: [name]  . Number of children: 4  . Years of education: 28  . Highest education level: Not on file  Social Needs  . Financial resource strain: Not on file  . Food insecurity - worry: Not on file  . Food insecurity - inability: Not on file  . Transportation needs - medical: Not on file  . Transportation needs - non-medical: Not on file  Occupational History    Employer: UNEMPLOYED    Comment: Disability  Tobacco Use  . Smoking status: Never Smoker  . Smokeless tobacco: Never Used  Substance and Sexual Activity  . Alcohol use: No    Alcohol/week: 0.0 oz  . Drug use: No  . Sexual activity: Not Currently    Birth control/protection: Surgical  Other Topics Concern  . Not on file  Social History Narrative   Lives with son, 1 dog   Occupation: unemployed, on disability for fibromyalgia since 2008.   Edu: HS   Religion: Jehova's witness   Activity: volunteers at senior center   Diet: some water, fruits/vegetables daily   No caffeine use    Family History  Problem Relation Age of Onset  . Lupus Sister          and niece  . Cancer Mother        lung (nonsmoker)  . CAD Mother        MI in her 83s  . ALS Mother   . Kidney disease Father   . Alcohol abuse Father   . Diabetes Father   . Cancer Brother        bone  . Diabetes Sister   . Stroke Sister   . Cancer Maternal Uncle        bone  . Depression Sister   . Kidney failure Other        on HD  . Diabetes Brother   . Heart  attack Brother   . Stroke Maternal Grandmother     Allergies  Allergen Reactions  . Cymbalta [Duloxetine Hcl] Other (See Comments)    tachycardia  . Statins Nausea Only and Other (See Comments)    Muscle cramps also  . Sulfa Antibiotics Nausea And Vomiting    Medication list reviewed and updated in full in [place].  GEN: No fevers, chills. Nontoxic. Primarily MSK c/o today. MSK: Detailed in the HPI GI: tolerating PO intake without difficulty Neuro: No numbness, parasthesias, or tingling associated. Otherwise the pertinent positives of the ROS are noted above.   Objective:   BP 126/74   Pulse 70   Temp 98.5 F (36.9 C) (Oral)   Ht 5\' 2"  (1.575 m)   Wt 182 lb 4 oz (82.7 kg)   BMI 33.33 kg/m   Relatively diffuse pain from L4-S1 and including the sacrum, more on the left, but also somewhat on the right. She also has soft tissue pain bilaterally in this region as well. There is also pain posteriorly along the sacral rim on the left also to.  She does have good motion rotationally in both hips. No groin pain. She does have lateral hip pain in the region of the trochanteric bursa.  Radiology: Dg Lumbar Spine Complete  Result Date: 02/03/2017 CLINICAL DATA:  Acute bilateral low back pain without sciatica. Fall. EXAM: LUMBAR SPINE - COMPLETE 4+ VIEW COMPARISON:  07/15/2016 FINDINGS: S1 is a transitional vertebra with a disc space at S1-2. 5 non-rib-bearing lumbar vertebral bodies are present. Negative for fracture. Normal alignment. Mild disc degeneration at L1-2, L2-3, L3-4, L4-5. Negative  for mass or pars defect. IMPRESSION: Mild lumbar degenerative change.  Negative for fracture. Electronically Signed   By: Franchot Gallo M.D.   On: 02/03/2017 11:12   Dg Hip Unilat With Pelvis 2-3 Views Left  Result Date: 02/03/2017 CLINICAL DATA:  Fall.  Left hip pain EXAM: DG HIP (WITH OR WITHOUT PELVIS) 2-3V LEFT COMPARISON:  None. FINDINGS: There is no evidence of hip fracture or dislocation. There is no evidence of arthropathy or other focal bone abnormality. IMPRESSION: Negative. Electronically Signed   By: Franchot Gallo M.D.   On: 02/03/2017 11:14     Assessment and Plan:   Left hip pain - Plan: DG HIP UNILAT WITH PELVIS 2-3 VIEWS LEFT  Acute bilateral low back pain without sciatica - Plan: DG Lumbar Spine Complete  Fall, initial encounter - Plan: DG Lumbar Spine Complete, DG HIP UNILAT WITH PELVIS 2-3 VIEWS LEFT  Traumatic fall with bone bruising and soft tissue bruising. I would anticipate at least 3-4 weeks before resolution of symptoms. Tylenol and tramadol for pain relief.  Follow-up: No Follow-up on file.  Future Appointments  Date Time Provider [place]  04/01/2017  8:00 AM Cloria Spring, MD [facility] None  05/16/2017  9:15 AM Bo Merino, MD [facility] None  12/11/2017 10:30 AM Eustace Pen, LPN [facility]  06/24/8117  9:30 AM Ria Bush, MD [facility]    Orders Placed This Encounter  Procedures  . DG Lumbar Spine Complete  . DG HIP UNILAT WITH PELVIS 2-3 VIEWS LEFT    Signed,  Asyia Hornung T. Mitzi Lilja, MD   Allergies as of 02/03/2017      Reactions   Cymbalta [duloxetine Hcl] Other (See Comments)   tachycardia   Statins Nausea Only, Other (See Comments)   Muscle cramps also   Sulfa Antibiotics Nausea And Vomiting      Medication List  Accurate as of 02/03/17  2:12 PM. Always use your most recent med list.          ALPRAZolam 1 MG tablet Commonly known as:  XANAX Take 1 tablet (1 mg total) by mouth 3 (three) times daily  as needed for anxiety.   amLODipine 10 MG tablet Commonly known as:  NORVASC TAKE 1 TABLET BY MOUTH  DAILY   antiseptic oral rinse Liqd 15 mLs by Mouth Rinse route at bedtime.   aspirin 325 MG EC tablet Take 325 mg by mouth daily.   cholecalciferol 1000 units tablet Commonly known as:  VITAMIN D Take 1,000 Units by mouth daily.   escitalopram 20 MG tablet Commonly known as:  LEXAPRO Take 1 tablet (20 mg total) by mouth 2 (two) times daily.   ezetimibe 10 MG tablet Commonly known as:  ZETIA Take 1 tablet (10 mg total) by mouth at bedtime.   famotidine 20 MG tablet Commonly known as:  PEPCID TAKE 1 TABLET BY MOUTH TWO  TIMES DAILY   furosemide 20 MG tablet Commonly known as:  LASIX TAKE 1 TABLET BY MOUTH  DAILY AS NEEDED FOR EDEMA   gabapentin 300 MG capsule Commonly known as:  NEURONTIN TAKE 2 CAPSULES EVERY  MORNING, 1 CAPSULE EVERY  AFTERNOON AND 2 CAPSULES AT BEDTIME.   hydrochlorothiazide 12.5 MG capsule Commonly known as:  MICROZIDE TAKE 1 CAPSULE BY MOUTH  DAILY   lovastatin 10 MG tablet Commonly known as:  MEVACOR Take 1 tablet (10 mg total) by mouth every Monday, Wednesday, and Friday.   metoprolol succinate 50 MG 24 hr tablet Commonly known as:  TOPROL-XL Take 1 tablet (50 mg total) by mouth 2 (two) times daily. Take with or immediately following a meal   RESTASIS 0.05 % ophthalmic emulsion Generic drug:  cycloSPORINE INSTILL ONE DROP IN BOTH  EYES TWO TIMES DAILY   SYSTANE 0.4-0.3 % Gel ophthalmic gel Generic drug:  Polyethyl Glycol-Propyl Glycol Place 1 drop into both eyes 2 (two) times daily.   traMADol 50 MG tablet Commonly known as:  ULTRAM Take 1 tablet (50 mg total) by mouth 3 (three) times daily.   VOLTAREN 1 % Gel Generic drug:  diclofenac sodium Apply 1 application topically 2 (two) times daily as needed (pain).   zolpidem 10 MG tablet Commonly known as:  AMBIEN Take 1 tablet (10 mg total) by mouth at bedtime as needed for sleep.

## 2017-02-06 ENCOUNTER — Other Ambulatory Visit: Payer: Self-pay | Admitting: Family Medicine

## 2017-02-20 ENCOUNTER — Other Ambulatory Visit: Payer: Self-pay | Admitting: Family Medicine

## 2017-03-10 ENCOUNTER — Encounter: Payer: Self-pay | Admitting: Family Medicine

## 2017-03-12 ENCOUNTER — Telehealth: Payer: Self-pay

## 2017-03-12 MED ORDER — LOVASTATIN 10 MG PO TABS: 10.0000 mg | ORAL_TABLET | ORAL | 1 refills | Status: DC

## 2017-03-12 NOTE — Telephone Encounter (Signed)
Sent rx to [facility] per pt request.

## 2017-03-31 ENCOUNTER — Ambulatory Visit: Payer: Self-pay | Admitting: *Deleted

## 2017-03-31 NOTE — Telephone Encounter (Signed)
   Answer Assessment - Initial Assessment Questions Alexandria Leach phoned in with back and abdominal pain that has been intermittent for 1 week. During our conversation she remembered that it is her Rheumatologist that usually deals with her back pain. She decided she would phone Rheumatologist to report her back pain. She will phone back is she need to.   1. ONSET: "When did the pain begin?"      1 week ago  2. LOCATION: "Where does it hurt?" (upper, mid or lower back)   Lower back and moves around to lower abdomen and all across area 3. SEVERITY: "How bad is the pain?"  (e.g., Scale 1-10; mild, moderate, or severe)   - MILD (1-3): doesn't interfere with normal activities    - MODERATE (4-7): interferes with normal activities or awakens from sleep    - SEVERE (8-10): excruciating pain, unable to do any normal activities      severe 4. PATTERN: "Is the pain constant?" (e.g., yes, no; constant, intermittent)    Intermittent, worse in the morning when she gets up. 5. RADIATION: "Does the pain shoot into your legs or elsewhere?"    no 6. CAUSE:  "What do you think is causing the back pain?"      no 7. BACK OVERUSE:  "Any recent lifting of heavy objects, strenuous work or exercise?"    She lifts water jugs from her car to the kitchen twice a week 8. MEDICATIONS: "What have you taken so far for the pain?" (e.g., nothing, acetaminophen, NSAIDS)     Taking 1 tramadol but doesn't help 9. NEUROLOGIC SYMPTOMS: "Do you have any weakness, numbness, or problems with bowel/bladder control?"     no 10. OTHER SYMPTOMS: "Do you have any other symptoms?" (e.g., fever, abdominal pain, burning with urination, blood in urine)     Feels though she might have fever at night, sometimes. Voids okay and lbm yesterday. 11. PREGNANCY: "Is there any chance you are pregnant?" (e.g., yes, no; LMP)    no  Protocols used: BACK PAIN-A-AH

## 2017-03-31 NOTE — Telephone Encounter (Signed)
Alexandria Leach has an appointment for her back and abdomen pain with the Rheumatologist tomorrow.

## 2017-03-31 NOTE — Telephone Encounter (Signed)
Opened in error

## 2017-04-01 ENCOUNTER — Ambulatory Visit (HOSPITAL_COMMUNITY): Payer: Self-pay | Admitting: Psychiatry

## 2017-04-03 ENCOUNTER — Ambulatory Visit (INDEPENDENT_AMBULATORY_CARE_PROVIDER_SITE_OTHER): Payer: Medicare Other | Admitting: Psychiatry

## 2017-04-03 ENCOUNTER — Encounter (HOSPITAL_COMMUNITY): Payer: Self-pay | Admitting: Psychiatry

## 2017-04-03 VITALS — BP 132/83 | HR 70 | Ht 62.0 in | Wt 182.0 lb

## 2017-04-03 DIAGNOSIS — M2550 Pain in unspecified joint: Secondary | ICD-10-CM | POA: Diagnosis not present

## 2017-04-03 DIAGNOSIS — Z560 Unemployment, unspecified: Secondary | ICD-10-CM | POA: Diagnosis not present

## 2017-04-03 DIAGNOSIS — Z811 Family history of alcohol abuse and dependence: Secondary | ICD-10-CM | POA: Diagnosis not present

## 2017-04-03 DIAGNOSIS — Z736 Limitation of activities due to disability: Secondary | ICD-10-CM

## 2017-04-03 DIAGNOSIS — M069 Rheumatoid arthritis, unspecified: Secondary | ICD-10-CM

## 2017-04-03 DIAGNOSIS — M3500 Sicca syndrome, unspecified: Secondary | ICD-10-CM

## 2017-04-03 DIAGNOSIS — F331 Major depressive disorder, recurrent, moderate: Secondary | ICD-10-CM | POA: Diagnosis not present

## 2017-04-03 DIAGNOSIS — Z818 Family history of other mental and behavioral disorders: Secondary | ICD-10-CM

## 2017-04-03 DIAGNOSIS — M797 Fibromyalgia: Secondary | ICD-10-CM

## 2017-04-03 DIAGNOSIS — M549 Dorsalgia, unspecified: Secondary | ICD-10-CM

## 2017-04-03 MED ORDER — ESCITALOPRAM OXALATE 20 MG PO TABS: 20.0000 mg | ORAL_TABLET | Freq: Two times a day (BID) | ORAL | 2 refills | Status: DC

## 2017-04-03 MED ORDER — AMITRIPTYLINE HCL 25 MG PO TABS: 25.0000 mg | ORAL_TABLET | Freq: Every day | ORAL | 2 refills | Status: DC

## 2017-04-03 MED ORDER — ALPRAZOLAM 1 MG PO TABS: 1.0000 mg | ORAL_TABLET | Freq: Three times a day (TID) | ORAL | 1 refills | Status: DC | PRN

## 2017-04-03 MED ORDER — ZOLPIDEM TARTRATE 10 MG PO TABS: 10.0000 mg | ORAL_TABLET | Freq: Every evening | ORAL | 2 refills | Status: DC | PRN
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
## 2017-04-26 ENCOUNTER — Other Ambulatory Visit: Payer: Self-pay | Admitting: Family Medicine

## 2017-05-01 ENCOUNTER — Other Ambulatory Visit: Payer: Self-pay | Admitting: Family Medicine

## 2017-05-01 NOTE — Telephone Encounter (Signed)
Last filled:  03/13/17, #450 Last OV:  02/03/17 Next OV (CPE):  12/15/17

## 2017-05-02 NOTE — Progress Notes (Signed)
Office Visit Note  Patient: Alexandria Leach             Date of Birth: 04-08-1950           MRN: [id number]             PCP: Ria Bush, MD Referring: Ria Bush, MD Visit Date: 05/16/2017 Occupation: @GUAROCC @    Subjective:  Back pain    History of Present Illness: Alexandria Leach is a 67 y.o. female with history of Sjogren's, osteopenia, fibromyalgia, DDD, and fibromyalgia.  Patient states she continues to take Plaquenil 200 mg BID M-F.  She also takes Pilocarpine 5 mg TID, which has been helping her dry mouth.  She states she has been experiencing a lot of sweating.  She states she continues to have dry eye symptoms and uses eye drops.  She states she sees her dentist every 6 months and she denies any recent dental caries.   Patient states has has been having worsening fatigue and muscle aches due to her fibromyalgia flaring.  She states she has been having increased pain in her lower back.  She takes Tramadol when the pain is severe.  She states her insomnia is improving.  She has been walking for exercise.  She has bilateral trochanteric bursitis.  She performs exercises at home.  She states her muscle tenderness worsens if she is standing for prolonged periods of time.  She has significant joint stiffness especially in the morning and if she is sitting for long periods of times.   She states she has been having anterior neck pain and fullness.  She is scheduled for an ultrasound on 05/20/17 of her head and neck.      She has edema in her bilateral legs.  She does not wear compression stockings.   She reports she has been having numbness in her right hand.  She denies a history of carpal tunnel.  She states she experiences these symptoms at night.       Activities of Daily Living:  Patient reports morning stiffness for 4-5  hours.   Patient Reports nocturnal pain.  Difficulty dressing/grooming: Denies Difficulty climbing stairs: Reports Difficulty getting out of  chair: Reports Difficulty using hands for taps, buttons, cutlery, and/or writing: Reports   Review of Systems  Constitutional: Positive for fatigue. Negative for weakness.  HENT: Positive for trouble swallowing, trouble swallowing and mouth dryness. Negative for mouth sores and nose dryness.   Eyes: Negative for pain, redness and dryness.  Respiratory: Negative for cough, hemoptysis, shortness of breath and difficulty breathing.   Cardiovascular: Negative for chest pain, palpitations, hypertension, irregular heartbeat and swelling in legs/feet.  Gastrointestinal: Negative for blood in stool, constipation and diarrhea.  Endocrine: Negative for increased urination.  Genitourinary: Negative for painful urination.  Musculoskeletal: Positive for arthralgias, joint pain, morning stiffness and muscle tenderness. Negative for joint swelling, myalgias, muscle weakness and myalgias.  Skin: Negative for color change, pallor, rash, hair loss, nodules/bumps, redness, skin tightness, ulcers and sensitivity to sunlight.  Allergic/Immunologic: Negative for susceptible to infections.  Neurological: Negative for dizziness, numbness and headaches.  Hematological: Negative for swollen glands.  Psychiatric/Behavioral: Positive for depressed mood and sleep disturbance. The patient is nervous/anxious.     PMFS History:  Patient Active Problem List   Diagnosis Date Noted  . Anterior neck pain 05/14/2017  . Lymphadenopathy of head and neck region 10/29/2016  . High risk medication use 06/05/2016  . Peripheral neuropathy 01/30/2016  . DNR no  code (do not resuscitate) 12/08/2015  . 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 TIA (transient ischemic attack) 10/12/2015  . Abnormal urinalysis 10/12/2015  . Mild mitral regurgitation 10/12/2015  . Osteopenia 09/18/2015  . Right arm pain 08/29/2015  . Fall   . Trimalleolar fracture of left ankle 02/23/2015  . Right sided sciatica 02/21/2015  .  Imbalance 01/12/2015  . Transaminitis 12/24/2014  . DDD (degenerative disc disease), cervical   . Health maintenance examination 10/18/2014  . Advanced care planning/counseling discussion 10/18/2014  . Medicare annual wellness visit, initial 10/18/2014  . Refusal of blood transfusions as patient is Jehovah's Witness   . Sjogren's syndrome ([place])   . CKD (chronic kidney disease) stage 3, GFR 30-59 ml/min (HCC)   . Glaucoma   . Fibromyalgia   . Osteoarthritis   . History of pulmonary embolism   . Lung nodule 09/22/2012  . Diastolic CHF ([place]) 34/19/3790  . Aortic stenosis 04/22/2011  . Palpitations 08/10/2010  . Dyspnea 08/10/2010  . HOT FLASHES 06/28/2008  . Trochanteric bursitis of right hip 06/28/2008  . ARTHRALGIA 07/01/2007  . BACK PAIN, LEFT 08/25/2006  . GAD (generalized anxiety disorder) 03/28/2006  . MITRAL VALVE REPLACEMENT, HX OF 03/28/2006  . TAH/BSO, HX OF 03/28/2006  . HYPERCHOLESTEROLEMIA 12/31/2005  . MDD (major depressive disorder), recurrent episode ([place]) 12/31/2005  . RHEUMATIC HEART DISEASE 12/31/2005  . Essential hypertension 12/31/2005  . Chronic insomnia 12/31/2005    Past Medical History:  Diagnosis Date  . Ankle fracture, left 02/23/2015  . Ankle fracture, left 02/19/2015   from a fall  . Anxiety   . CHF (congestive heart failure) ([place])   . CKD (chronic kidney disease) stage 3, GFR 30-59 ml/min [facility])    saw nephrologist Dr Harden Mo  . DDD (degenerative disc disease), cervical   . Depression   . Fibromyalgia   . Glaucoma    s/p surgery, sees ophtho Q6 mo  . 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 History of kidney stones 2010  . History of pulmonary embolism 2001, 2006   completed coumadin courses  . History of rheumatic fever x3  . HLD (hyperlipidemia)   . HTN (hypertension)   . Insomnia   . Lung nodule 09/22/2012   RLL - 68mm, stable since 2014. Thought benign.   . Osteoarthritis     shoulders and knees, not RA per Dr Estanislado Pandy, positive ANA, positive Ro  . Osteopenia 09/18/2015   DEXA T -1.1 hip, -0.2 spine 08/2015   . Personal history of urinary calculi latest 2014  . Pneumonia 12/02/2011  . PONV (postoperative nausea and vomiting)   . Refusal of blood transfusions as patient is Jehovah's Witness   . Rheumatic heart disease 1980   s/p mitral valve repair 1980  . Sjogren's syndrome ([place])     Family History  Problem Relation Age of Onset  . Lupus Sister        and niece  . Cancer Mother        lung (nonsmoker)  . CAD Mother        MI in her 35s  . ALS Mother   . Kidney disease Father   . Alcohol abuse Father   . Diabetes Father   . Cancer Brother        bone  . Diabetes Sister   . Stroke Sister   . Cancer Maternal Uncle        bone  .  Depression Sister   . Kidney failure Other        on HD  . Diabetes Brother   . Heart attack Brother   . Stroke Maternal Grandmother    Past Surgical History:  Procedure Laterality Date  . BREAST BIOPSY Right 2006   benign  . CHOLECYSTECTOMY  11/27/2011   Procedure: LAPAROSCOPIC CHOLECYSTECTOMY WITH INTRAOPERATIVE CHOLANGIOGRAM;  Surgeon: Adin Hector, MD;  Location: [place];  Service: General;  Laterality: N/A;  laparoscopic cholecystectomy with choleangiogram umbilical hernia repair  . COLONOSCOPY  07/2014   WNL Amedeo Plenty)  . dexa  08/2012   normal per patient - no records available  . MITRAL VALVE REPAIR  1980   open heart  . ORIF ANKLE FRACTURE Left 02/26/2015   Procedure: OPEN REDUCTION INTERNAL FIXATION (ORIF) LEFT TRIMALLEOLAR ANKLE FRACTURE;  Surgeon: [name], MD;  Location: [place];  Service: Orthopedics;  Laterality: Left;  . TUBAL LIGATION  1980  . UMBILICAL HERNIA REPAIR  11/27/2011   Procedure: HERNIA REPAIR UMBILICAL ADULT;  Surgeon: Adin Hector, MD;  Location: [place];  Service: General;  Laterality: N/A;  . [place]   for fibroids -- partial, ovaries remain   Social History     Social History Narrative   Lives with son, 1 dog   Occupation: unemployed, on disability for fibromyalgia since 2008.   Edu: HS   Religion: Jehova's witness   Activity: volunteers at senior center   Diet: some water, fruits/vegetables daily   No caffeine use     Objective: Vital Signs: BP (!) 142/82 (BP Location: Left Arm, Patient Position: Sitting, Cuff Size: Normal)   Pulse 86   Resp 14   Ht 5\' 1"  (1.549 m)   Wt 192 lb (87.1 kg)   BMI 36.28 kg/m    Physical Exam  Constitutional: She is oriented to person, place, and time. She appears well-developed and well-nourished.  HENT:  Head: Normocephalic and atraumatic.  Eyes: Conjunctivae and EOM are normal.  Neck: Normal range of motion.  Cardiovascular: Normal rate, regular rhythm, normal heart sounds and intact distal pulses.  Pulmonary/Chest: Effort normal and breath sounds normal.  Abdominal: Soft. Bowel sounds are normal.  Lymphadenopathy:    She has no cervical adenopathy.  Neurological: She is alert and oriented to person, place, and time.  Skin: Skin is warm and dry. Capillary refill takes less than 2 seconds.  Psychiatric: She has a normal mood and affect. Her behavior is normal.  Nursing note and vitals reviewed.    Musculoskeletal Exam: C-spine, thoracic, and lumbar good ROM.  No midline spinal tenderness.  No SI joint tenderness.  Shoulder joints, elbow joints, wrist joints, MCPs, PIPs, and DIPs good ROM with no synovitis.  She has mild DIP synovial thickening consistent with osteoarthritis.  Hip joints, knee joints, ankle joints, MTPs, PIPs, and DIPs good ROM with no synovitis.  She has bilateral knee crepitus.  No warmth or effusion.  She has bilateral trochanteric bursa tenderness.   CDAI Exam: No CDAI exam completed.    Investigation: No additional findings.PLQ eye exam: 06/05/2016 UDS: 10/16/2016 Narc agreement: 10/16/2016 CBC Latest Ref Rng & Units 11/15/2016 10/29/2016 09/26/2016  WBC 4.0 - 10.5 K/uL - 4.9  5.2  Hemoglobin 12.0 - 15.0 g/dL 12.6 12.6 11.8  Hematocrit 36.0 - 46.0 % 37.0 38.6 36.8  Platelets 150.0 - 400.0 K/uL - 223.0 Repeated and verified X2. 232   CMP Latest Ref Rng & Units 12/09/2016 11/15/2016 10/29/2016  Glucose 70 - 99 mg/dL 101(H) 98 140(H)  BUN 6 - 23 mg/dL 9 8 11   Creatinine 0.40 - 1.20 mg/dL 1.10 1.10(H) 1.18  Sodium 135 - 145 mEq/L 143 140 137  Potassium 3.5 - 5.1 mEq/L 3.8 3.6 3.6  Chloride 96 - 112 mEq/L 108 105 104  CO2 19 - 32 mEq/L 26 - 23  Calcium 8.4 - 10.5 mg/dL 10.0 - 9.5  Total Protein 6.0 - 8.3 g/dL - - 6.8  Total Bilirubin 0.2 - 1.2 mg/dL - - 0.4  Alkaline Phos 39 - 117 U/L - - 50  AST 0 - 37 U/L - - 26  ALT 0 - 35 U/L - - 14    Imaging: No results found.  Speciality Comments: No specialty comments available.    Procedures:  No procedures performed Allergies: Cymbalta [duloxetine hcl]; Statins; and Sulfa antibiotics   Assessment / Plan:     Visit Diagnoses: Sjogren's syndrome, with unspecified organ involvement ([place]) - +ANA, +Ro.-She continues to have sicca symptoms.  She takes Pilocarpine 5 mg TID, which helps with her symptoms of dry mouth.  She has no parotid swelling on exam.  She reports she goes to the dentist every 6 months.  She denies any recent dental caries. She uses eye drops for her dry eyes.  She is on Plaquenil 200 mg BID M-F.  She was given an eye exam form today in the office to take with her to her appointment.  UA, CBC, CMP, RF, and SPEP labs were drawn today. - Plan: Urinalysis, Routine w reflex microscopic, CBC with Differential/Platelet, COMPLETE METABOLIC PANEL WITH GFR, Rheumatoid factor, Serum protein electrophoresis with reflex  High risk medication use - Plaquenil 200 mg by mouth daily Monday to Friday, pilocarpine 5 mg by mouth 3 times a day when necessary. eye exam: 06/05/2016.  She was given an eye exam form today.  CBC and CMP were drawn today to monitor for drug toxicity. - Plan: CBC with Differential/Platelet, COMPLETE  METABOLIC PANEL WITH GFR  Osteopenia of multiple sites: She takes Vitamin D supplement.  Fibromyalgia: She has muscle tenderness in the trapezius region and paraspinal muscles.  She also has bilateral trochanteric bursitis.  She was encouraged to perform exercises at home to help with this.  She declined a cortisone injection today due to it not helping in the past.  She is on Gabapentin.  She takes Tramadol when her pain is severe.  UDS and narcotic agreement are up to date. She was encouraged to continue to exercise on a regular basis.   DDD (degenerative disc disease), cervical: Chronic pain.  She has good ROM on exam.   Chronic midline low back pain without sciatica: Chronic pain.  No midline spinal tenderness on exam.   Other chronic pain - tramadol UDS: 10/16/2016, Narc agreement: 10/16/2016  Other fatigue: Worsening.  She was encouraged to exercise on a regular basis.   Other insomnia: Improved.   Paresthesias in right hand: She has a negative Phalen's and Tinel's sign.  She has symptoms of numbness and tingling in her right hand.  She denies a history of carpal tunnel.  She has been experiencing these symptoms at night as well.  She declined scheduling a nerve conduction velocity study at this time.  We discussed trying a night splint if her symptoms worsen.    Other medical conditions are listed as follows:   History of pulmonary embolism  CKD (chronic kidney disease) stage 3, GFR 30-59 ml/min (HCC)  History  of anxiety  History of hypertension  History of hypercholesterolemia     Orders: Orders Placed This Encounter  Procedures  . Urinalysis, Routine w reflex microscopic  . CBC with Differential/Platelet  . COMPLETE METABOLIC PANEL WITH GFR  . Rheumatoid factor  . Serum protein electrophoresis with reflex   No orders of the defined types were placed in this encounter.    Follow-Up Instructions: Return in about 6 months (around 11/16/2017) for Sjogren's syndrome,  Fibromyalgia.   Ofilia Neas, PA-C  Note - This record has been created using Dragon software.  Chart creation errors have been sought, but may not always  have been located. Such creation errors do not reflect on  the standard of medical care.

## 2017-05-14 ENCOUNTER — Ambulatory Visit (INDEPENDENT_AMBULATORY_CARE_PROVIDER_SITE_OTHER): Payer: Medicare Other | Admitting: Family Medicine

## 2017-05-14 ENCOUNTER — Encounter: Payer: Self-pay | Admitting: Family Medicine

## 2017-05-14 VITALS — BP 130/78 | HR 73 | Temp 98.5°F | Wt 193.2 lb

## 2017-05-14 DIAGNOSIS — M542 Cervicalgia: Secondary | ICD-10-CM | POA: Insufficient documentation

## 2017-05-14 DIAGNOSIS — R221 Localized swelling, mass and lump, neck: Secondary | ICD-10-CM

## 2017-05-14 NOTE — Patient Instructions (Addendum)
There is some fullness to right anterior neck - see Rosaria Ferries or Anastasiya to schedule neck ultrasound. We will be in touch with results.

## 2017-05-14 NOTE — Progress Notes (Signed)
BP 130/78 (BP Location: Left Arm, Patient Position: Sitting, Cuff Size: Normal)   Pulse 73   Temp 98.5 F (36.9 C) (Oral)   Wt 193 lb 4 oz (87.7 kg)   SpO2 96%   BMI 35.35 kg/m    CC: R neck pain Subjective:    Patient ID: Alexandria Leach, female    DOB: 13-Jul-1950, 67 y.o.   MRN: [id number]  HPI: Alexandria Leach is a 67 y.o. female presenting on 05/14/2017 for Neck Pain (Pain in right side of anterior neck. Started last week. Thought she slept wrong but pain is worsening. Tried topical tx, no help. )   1+ wk h/o R anterior neck pain that may have started after sleeping awkwardly one night. Has tried aspercream OTC topical ointment and voltaren gel without improvement. Progressively worsening, with radiation to shoulder. Hasn't noticed any swollen glands. Gabapentin hasn't helped. Tramadol helps some. Denies weakness of R arm. chronic R hand paresthesias. No posterior neck pain.   No objective fevers/chills, sore throat, cough. She does feel warm at night localized to lower back from chronic back pain  Known sjogren's, RA, FM.   Relevant past medical, surgical, family and social history reviewed and updated as indicated. Interim medical history since our last visit reviewed. Allergies and medications reviewed and updated. Outpatient Medications Prior to Visit  Medication Sig Dispense Refill  . ALPRAZolam (XANAX) 1 MG tablet Take 1 tablet (1 mg total) by mouth 3 (three) times daily as needed for anxiety. 270 tablet 1  . amitriptyline (ELAVIL) 25 MG tablet Take 1 tablet (25 mg total) by mouth at bedtime. 90 tablet 2  . amLODipine (NORVASC) 10 MG tablet TAKE 1 TABLET BY MOUTH  DAILY 90 tablet 1  . antiseptic oral rinse (BIOTENE) LIQD 15 mLs by Mouth Rinse route at bedtime.     Marland Kitchen aspirin 325 MG EC tablet Take 325 mg by mouth daily.     . cholecalciferol (VITAMIN D) 1000 UNITS tablet Take 1,000 Units by mouth daily.     . diclofenac sodium (VOLTAREN) 1 % GEL Apply 1 application  topically 2 (two) times daily as needed (pain).    Marland Kitchen escitalopram (LEXAPRO) 20 MG tablet Take 1 tablet (20 mg total) by mouth 2 (two) times daily. 180 tablet 2  . ezetimibe (ZETIA) 10 MG tablet TAKE 1 TABLET BY MOUTH AT  BEDTIME 90 tablet 2  . famotidine (PEPCID) 20 MG tablet TAKE 1 TABLET BY MOUTH TWO  TIMES DAILY 180 tablet 2  . furosemide (LASIX) 20 MG tablet TAKE 1 TABLET BY MOUTH  DAILY AS NEEDED FOR EDEMA 90 tablet 3  . gabapentin (NEURONTIN) 300 MG capsule TAKE 2 CAPSULES EVERY  MORNING, 1 CAPSULE EVERY  AFTERNOON AND 2 CAPSULES AT BEDTIME. 450 capsule 1  . hydrochlorothiazide (MICROZIDE) 12.5 MG capsule TAKE 1 CAPSULE BY MOUTH  DAILY 90 capsule 3  . lovastatin (MEVACOR) 10 MG tablet Take 1 tablet (10 mg total) by mouth every Monday, Wednesday, and Friday. 70 tablet 1  . metoprolol succinate (TOPROL-XL) 50 MG 24 hr tablet Take 1 tablet (50 mg total) by mouth 2 (two) times daily. Take with or immediately following a meal 180 tablet 1  . Polyethyl Glycol-Propyl Glycol (SYSTANE) 0.4-0.3 % GEL Place 1 drop into both eyes 2 (two) times daily.     . RESTASIS 0.05 % ophthalmic emulsion INSTILL ONE DROP IN BOTH  EYES TWO TIMES DAILY 180 each 0  . traMADol (ULTRAM) 50 MG tablet Take  1 tablet (50 mg total) by mouth 3 (three) times daily. 90 tablet 3  . zolpidem (AMBIEN) 10 MG tablet Take 1 tablet (10 mg total) by mouth at bedtime as needed for sleep. 30 tablet 2   No facility-administered medications prior to visit.      Per HPI unless specifically indicated in ROS section below Review of Systems     Objective:    BP 130/78 (BP Location: Left Arm, Patient Position: Sitting, Cuff Size: Normal)   Pulse 73   Temp 98.5 F (36.9 C) (Oral)   Wt 193 lb 4 oz (87.7 kg)   SpO2 96%   BMI 35.35 kg/m   Wt Readings from Last 3 Encounters:  05/14/17 193 lb 4 oz (87.7 kg)  02/03/17 182 lb 4 oz (82.7 kg)  12/13/16 176 lb (79.8 kg)    Physical Exam  Constitutional: She appears well-developed and  well-nourished. No distress.  HENT:  Head: Normocephalic and atraumatic.  Right Ear: Hearing, tympanic membrane, external ear and ear canal normal.  Left Ear: Hearing, tympanic membrane, external ear and ear canal normal.  Nose: No mucosal edema or rhinorrhea. Right sinus exhibits no maxillary sinus tenderness and no frontal sinus tenderness. Left sinus exhibits no maxillary sinus tenderness and no frontal sinus tenderness.  Mouth/Throat: Uvula is midline, oropharynx is clear and moist and mucous membranes are normal. No oropharyngeal exudate, posterior oropharyngeal edema, posterior oropharyngeal erythema or tonsillar abscesses.  Eyes: Conjunctivae and EOM are normal. Pupils are equal, round, and reactive to light. No scleral icterus.  Neck: Normal range of motion. JVD present. No hepatojugular reflux present. Carotid bruit is present (?referred from murmur). No thyromegaly present.  FROM cervical neck without any midline or paracervical mm tenderness Fullness and tenderness to R anterior lower neck  Cardiovascular: Normal rate, regular rhythm and intact distal pulses.  Murmur (3/6 systolic) heard. Pulmonary/Chest: Effort normal and breath sounds normal. No respiratory distress. She has no wheezes. She has no rales.  Lymphadenopathy:    She has no cervical adenopathy.  Skin: Skin is warm and dry. No rash noted.  Nursing note and vitals reviewed.     Assessment & Plan:   Problem List Items Addressed This Visit    Anterior neck pain - Primary    New discomfort, fullness R anterior neck over the past week. No signs of bacterial infection. Not consistent with cervical strain or cervical radiculopathy. ?SCM spasm .Will start eval with neck US, ordered today eval mass, LAD. Consider muscle relaxant if reassuring/unrevealing [place].       Relevant Orders   US Soft Tissue Head/Neck    Other Visit Diagnoses    Neck swelling       Relevant Orders   US Soft Tissue Head/Neck       No orders of  the defined types were placed in this encounter.  Orders Placed This Encounter  Procedures  . US Soft Tissue Head/Neck    Standing Status:   Future    Standing Expiration Date:   07/13/2018    Order Specific Question:   Reason for Exam (SYMPTOM  OR DIAGNOSIS REQUIRED)    Answer:   R anterior neck fullness and tenderness    Order Specific Question:   Preferred imaging location?    Answer:   [facility]    Follow up plan: Return if symptoms worsen or fail to improve.  Ria Bush, MD

## 2017-05-14 NOTE — Assessment & Plan Note (Signed)
New discomfort, fullness R anterior neck over the past week. No signs of bacterial infection. Not consistent with cervical strain or cervical radiculopathy. ?SCM spasm .Will start eval with neck US, ordered today eval mass, LAD. Consider muscle relaxant if reassuring/unrevealing [place].

## 2017-05-15 NOTE — Progress Notes (Signed)
HPI: FU mitral valve repair secondary to rheumatic heart disease. Patient is status post mitral valve repair in 1980. Operative report not available. Nuclear study 11/14 showed EF 66 and normal perfusion. Holter 11/14 showed sinus with pacs, pvcs and brief PAT. Last echo 7/17 showed normal LV systolic function, grade 2 diastolic dysfunction, mild AS/AI, s/p MV repair, mild MS (mean gradient 6 mmHg); mild to moderate MR, moderate LAE. Since last seen,  she notes some dyspnea on exertion.  No orthopnea or PND.  She does occasionally have pedal edema which improves with Lasix.  No chest pain or syncope.  She does continue to have palpitations described as a skip and pause.  Current Outpatient Medications  Medication Sig Dispense Refill  . ALPRAZolam (XANAX) 1 MG tablet Take 1 tablet (1 mg total) by mouth 3 (three) times daily as needed for anxiety. 270 tablet 1  . amitriptyline (ELAVIL) 25 MG tablet Take 1 tablet (25 mg total) by mouth at bedtime. 90 tablet 2  . amLODipine (NORVASC) 10 MG tablet TAKE 1 TABLET BY MOUTH  DAILY 90 tablet 1  . antiseptic oral rinse (BIOTENE) LIQD 15 mLs by Mouth Rinse route at bedtime.     Marland Kitchen aspirin 325 MG EC tablet Take 325 mg by mouth daily.     . cholecalciferol (VITAMIN D) 1000 UNITS tablet Take 1,000 Units by mouth daily.     . diclofenac sodium (VOLTAREN) 1 % GEL Apply 1 application topically 2 (two) times daily as needed (pain).    Marland Kitchen escitalopram (LEXAPRO) 20 MG tablet Take 1 tablet (20 mg total) by mouth 2 (two) times daily. 180 tablet 2  . ezetimibe (ZETIA) 10 MG tablet TAKE 1 TABLET BY MOUTH AT  BEDTIME 90 tablet 2  . famotidine (PEPCID) 20 MG tablet TAKE 1 TABLET BY MOUTH TWO  TIMES DAILY 180 tablet 2  . furosemide (LASIX) 20 MG tablet TAKE 1 TABLET BY MOUTH  DAILY AS NEEDED FOR EDEMA 90 tablet 3  . gabapentin (NEURONTIN) 300 MG capsule TAKE 2 CAPSULES EVERY  MORNING, 1 CAPSULE EVERY  AFTERNOON AND 2 CAPSULES AT BEDTIME. 450 capsule 1  .  hydrochlorothiazide (MICROZIDE) 12.5 MG capsule TAKE 1 CAPSULE BY MOUTH  DAILY 90 capsule 3  . lovastatin (MEVACOR) 10 MG tablet Take 1 tablet (10 mg total) by mouth every Monday, Wednesday, and Friday. 70 tablet 1  . metoprolol succinate (TOPROL-XL) 50 MG 24 hr tablet Take 1 tablet (50 mg total) by mouth 2 (two) times daily. Take with or immediately following a meal 180 tablet 1  . Polyethyl Glycol-Propyl Glycol (SYSTANE) 0.4-0.3 % GEL Place 1 drop into both eyes 2 (two) times daily.     . RESTASIS 0.05 % ophthalmic emulsion INSTILL ONE DROP IN BOTH  EYES TWO TIMES DAILY 180 each 0  . traMADol (ULTRAM) 50 MG tablet Take 1 tablet (50 mg total) by mouth 3 (three) times daily. 90 tablet 3   No current facility-administered medications for this visit.      Past Medical History:  Diagnosis Date  . Ankle fracture, left 02/23/2015  . Ankle fracture, left 02/19/2015   from a fall  . Anxiety   . CHF (congestive heart failure) ([place])   . CKD (chronic kidney disease) stage 3, GFR 30-59 ml/min [facility])    saw nephrologist Dr Harden Mo  . DDD (degenerative disc disease), cervical   . Depression   . Fibromyalgia   . Glaucoma    s/p surgery, sees  ophtho Q6 mo  . 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 History of kidney stones 2010  . History of pulmonary embolism 2001, 2006   completed coumadin courses  . History of rheumatic fever x3  . HLD (hyperlipidemia)   . HTN (hypertension)   . Insomnia   . Lung nodule 09/22/2012   RLL - 60mm, stable since 2014. Thought benign.   . Osteoarthritis    shoulders and knees, not RA per Dr Estanislado Pandy, positive ANA, positive Ro  . Osteopenia 09/18/2015   DEXA T -1.1 hip, -0.2 spine 08/2015   . Personal history of urinary calculi latest 2014  . Pneumonia 12/02/2011  . PONV (postoperative nausea and vomiting)   . Refusal of blood transfusions as patient is Jehovah's Witness   . Rheumatic heart disease  1980   s/p mitral valve repair 1980  . Sjogren's syndrome [facility])     Past Surgical History:  Procedure Laterality Date  . BREAST BIOPSY Right 2006   benign  . CHOLECYSTECTOMY  11/27/2011   Procedure: LAPAROSCOPIC CHOLECYSTECTOMY WITH INTRAOPERATIVE CHOLANGIOGRAM;  Surgeon: Adin Hector, MD;  Location: [place];  Service: General;  Laterality: N/A;  laparoscopic cholecystectomy with choleangiogram umbilical hernia repair  . COLONOSCOPY  07/2014   WNL Amedeo Plenty)  . dexa  08/2012   normal per patient - no records available  . MITRAL VALVE REPAIR  1980   open heart  . ORIF ANKLE FRACTURE Left 02/26/2015   Procedure: OPEN REDUCTION INTERNAL FIXATION (ORIF) LEFT TRIMALLEOLAR ANKLE FRACTURE;  Surgeon: [name], MD;  Location: [place];  Service: Orthopedics;  Laterality: Left;  . TUBAL LIGATION  1980  . UMBILICAL HERNIA REPAIR  11/27/2011   Procedure: HERNIA REPAIR UMBILICAL ADULT;  Surgeon: Adin Hector, MD;  Location: [place];  Service: General;  Laterality: N/A;  . [place]   for fibroids -- partial, ovaries remain    Social History   Socioeconomic History  . Marital status: Married    Spouse name: [name]  . Number of children: 4  . Years of education: 57  . Highest education level: Not on file  Social Needs  . Financial resource strain: Not on file  . Food insecurity - worry: Not on file  . Food insecurity - inability: Not on file  . Transportation needs - medical: Not on file  . Transportation needs - non-medical: Not on file  Occupational History    Employer: UNEMPLOYED    Comment: Disability  Tobacco Use  . Smoking status: Never Smoker  . Smokeless tobacco: Never Used  Substance and Sexual Activity  . Alcohol use: No    Alcohol/week: 0.0 oz  . Drug use: No  . Sexual activity: Not Currently    Birth control/protection: Surgical  Other Topics Concern  . Not on file  Social History Narrative   Lives with son, 1 dog   Occupation: unemployed, on  disability for fibromyalgia since 2008.   Edu: HS   Religion: Jehova's witness   Activity: volunteers at senior center   Diet: some water, fruits/vegetables daily   No caffeine use    Family History  Problem Relation Age of Onset  . Lupus Sister        and niece  . Cancer Mother        lung (nonsmoker)  . CAD Mother        MI in her 68s  . ALS  Mother   . Kidney disease Father   . Alcohol abuse Father   . Diabetes Father   . Cancer Brother        bone  . Diabetes Sister   . Stroke Sister   . Cancer Maternal Uncle        bone  . Depression Sister   . Kidney failure Other        on HD  . Diabetes Brother   . Heart attack Brother   . Stroke Maternal Grandmother     ROS: no fevers or chills, productive cough, hemoptysis, dysphasia, odynophagia, melena, hematochezia, dysuria, hematuria, rash, seizure activity, orthopnea, PND, pedal edema, claudication. Remaining systems are negative.  Physical Exam: Well-developed well-nourished in no acute distress.  Skin is warm and dry.  HEENT is normal.  Neck is supple.  Chest is clear to auscultation with normal expansion.  Cardiovascular exam is regular rate and rhythm. 2/6 systolic murmur apex Abdominal exam nontender or distended. No masses palpated. Extremities show trace edema. neuro grossly intact  ECG-sinus rhythm at a rate of 90.  Anterior T wave inversion.  No change compared to previous.  Personally reviewed  A/P  1 status post mitral valve repair-plan to continue SBE prophylaxis.  Patient also with a history of mild to moderate mitral regurgitation and mild aortic stenosis/mild aortic insufficiency.  Repeat echocardiogram.  2 hypertension-blood pressure is mildly elevated.  Increase Toprol to 75 mg twice daily and follow.  3 hyperlipidemia-continue statin per primary care.  4 palpitations-increased symptoms of palpitations.  Symptoms sound likely to be PACs or PVCs.  Increase Toprol to 75 mg twice daily and  follow.  5 chronic diastolic congestive heart failure-she is not volume overloaded on examination.  She does occasionally have pedal edema.  This appears to be controlled with present dose of diuretic.  She should continue with low-sodium diet and fluid restriction.  Kirk Ruths, MD

## 2017-05-16 ENCOUNTER — Ambulatory Visit (INDEPENDENT_AMBULATORY_CARE_PROVIDER_SITE_OTHER): Payer: Medicare Other | Admitting: Rheumatology

## 2017-05-16 ENCOUNTER — Encounter: Payer: Self-pay | Admitting: Rheumatology

## 2017-05-16 VITALS — BP 142/82 | HR 86 | Resp 14 | Ht 61.0 in | Wt 192.0 lb

## 2017-05-16 DIAGNOSIS — M545 Low back pain, unspecified: Secondary | ICD-10-CM

## 2017-05-16 DIAGNOSIS — R202 Paresthesia of skin: Secondary | ICD-10-CM

## 2017-05-16 DIAGNOSIS — G4709 Other insomnia: Secondary | ICD-10-CM | POA: Diagnosis not present

## 2017-05-16 DIAGNOSIS — M797 Fibromyalgia: Secondary | ICD-10-CM | POA: Diagnosis not present

## 2017-05-16 DIAGNOSIS — Z8679 Personal history of other diseases of the circulatory system: Secondary | ICD-10-CM

## 2017-05-16 DIAGNOSIS — M3500 Sicca syndrome, unspecified: Secondary | ICD-10-CM | POA: Diagnosis not present

## 2017-05-16 DIAGNOSIS — Z8659 Personal history of other mental and behavioral disorders: Secondary | ICD-10-CM

## 2017-05-16 DIAGNOSIS — Z86711 Personal history of pulmonary embolism: Secondary | ICD-10-CM

## 2017-05-16 DIAGNOSIS — R5383 Other fatigue: Secondary | ICD-10-CM | POA: Diagnosis not present

## 2017-05-16 DIAGNOSIS — Z79899 Other long term (current) drug therapy: Secondary | ICD-10-CM | POA: Diagnosis not present

## 2017-05-16 DIAGNOSIS — G8929 Other chronic pain: Secondary | ICD-10-CM | POA: Diagnosis not present

## 2017-05-16 DIAGNOSIS — N183 Chronic kidney disease, stage 3 unspecified: Secondary | ICD-10-CM

## 2017-05-16 DIAGNOSIS — Z8639 Personal history of other endocrine, nutritional and metabolic disease: Secondary | ICD-10-CM

## 2017-05-16 DIAGNOSIS — M5030 Other cervical disc degeneration, unspecified cervical region: Secondary | ICD-10-CM | POA: Diagnosis not present

## 2017-05-16 DIAGNOSIS — M8589 Other specified disorders of bone density and structure, multiple sites: Secondary | ICD-10-CM

## 2017-05-19 NOTE — Progress Notes (Signed)
Low GFR. Prob related to diuretics. Please, forward labs to her PCP.

## 2017-05-20 ENCOUNTER — Ambulatory Visit
Admission: RE | Admit: 2017-05-20 | Discharge: 2017-05-20 | Disposition: A | Discharge status: Home / Self Care | Payer: Medicare Other | Source: Ambulatory Visit | Attending: Family Medicine | Admitting: Family Medicine

## 2017-05-20 DIAGNOSIS — R221 Localized swelling, mass and lump, neck: Secondary | ICD-10-CM

## 2017-05-20 DIAGNOSIS — M542 Cervicalgia: Secondary | ICD-10-CM

## 2017-05-20 DIAGNOSIS — R220 Localized swelling, mass and lump, head: Secondary | ICD-10-CM | POA: Diagnosis not present

## 2017-05-20 LAB — URINALYSIS, ROUTINE W REFLEX MICROSCOPIC
BILIRUBIN URINE: NEGATIVE
GLUCOSE, UA: NEGATIVE
Hgb urine dipstick: NEGATIVE
Ketones, ur: NEGATIVE
Leukocytes, UA: NEGATIVE
Nitrite: NEGATIVE
PH: 8 (ref 5.0–8.0)
PROTEIN: NEGATIVE
SPECIFIC GRAVITY, URINE: 1.01 (ref 1.001–1.03)

## 2017-05-20 LAB — CBC WITH DIFFERENTIAL/PLATELET
BASOS PCT: 0.6 %
Basophils Absolute: 32 cells/uL (ref 0–200)
EOS PCT: 3.6 %
Eosinophils Absolute: 191 cells/uL (ref 15–500)
HCT: 36.7 % (ref 35.0–45.0)
HEMOGLOBIN: 12.1 g/dL (ref 11.7–15.5)
LYMPHS ABS: 1728 {cells}/uL (ref 850–3900)
MCH: 26.9 pg — ABNORMAL LOW (ref 27.0–33.0)
MCHC: 33 g/dL (ref 32.0–36.0)
MCV: 81.7 fL (ref 80.0–100.0)
MPV: 10.8 fL (ref 7.5–12.5)
Monocytes Relative: 10.8 %
NEUTROS ABS: 2777 {cells}/uL (ref 1500–7800)
NEUTROS PCT: 52.4 %
Platelets: 294 10*3/uL (ref 140–400)
RBC: 4.49 10*6/uL (ref 3.80–5.10)
RDW: 14 % (ref 11.0–15.0)
Total Lymphocyte: 32.6 %
WBC: 5.3 10*3/uL (ref 3.8–10.8)
WBCMIX: 572 {cells}/uL (ref 200–950)

## 2017-05-20 LAB — COMPLETE METABOLIC PANEL WITH GFR
AG RATIO: 1.5 (calc) (ref 1.0–2.5)
ALBUMIN MSPROF: 4.6 g/dL (ref 3.6–5.1)
ALT: 15 U/L (ref 6–29)
AST: 28 U/L (ref 10–35)
Alkaline phosphatase (APISO): 66 U/L (ref 33–130)
BUN / CREAT RATIO: 6 (calc) (ref 6–22)
BUN: 7 mg/dL (ref 7–25)
CALCIUM: 9.8 mg/dL (ref 8.6–10.4)
CO2: 25 mmol/L (ref 20–32)
CREATININE: 1.19 mg/dL — AB (ref 0.50–0.99)
Chloride: 107 mmol/L (ref 98–110)
GFR, EST AFRICAN AMERICAN: 56 mL/min/{1.73_m2} — AB (ref >=60)
GFR, EST NON AFRICAN AMERICAN: 48 mL/min/{1.73_m2} — AB (ref >=60)
GLOBULIN: 3.1 g/dL (ref 1.9–3.7)
Glucose, Bld: 90 mg/dL (ref 65–99)
POTASSIUM: 3.9 mmol/L (ref 3.5–5.3)
SODIUM: 140 mmol/L (ref 135–146)
TOTAL PROTEIN: 7.7 g/dL (ref 6.1–8.1)
Total Bilirubin: 0.6 mg/dL (ref 0.2–1.2)

## 2017-05-20 LAB — RHEUMATOID FACTOR: Rhuematoid fact SerPl-aCnc: <14 IU/mL (ref <14)

## 2017-05-20 LAB — PROTEIN ELECTROPHORESIS, SERUM, WITH REFLEX
ALBUMIN ELP: 4.5 g/dL (ref 3.8–4.8)
ALPHA 1: 0.3 g/dL (ref 0.2–0.3)
ALPHA 2: 0.8 g/dL (ref 0.5–0.9)
Beta 2: 0.5 g/dL (ref 0.2–0.5)
Beta Globulin: 0.5 g/dL (ref 0.4–0.6)
Gamma Globulin: 1.4 g/dL (ref 0.8–1.7)
Total Protein: 8 g/dL (ref 6.1–8.1)

## 2017-05-20 NOTE — Progress Notes (Signed)
SPEP, RF and UA WNL.

## 2017-05-22 ENCOUNTER — Other Ambulatory Visit: Payer: Self-pay | Admitting: Family Medicine

## 2017-05-22 ENCOUNTER — Ambulatory Visit (INDEPENDENT_AMBULATORY_CARE_PROVIDER_SITE_OTHER): Payer: Medicare Other | Admitting: Cardiology

## 2017-05-22 ENCOUNTER — Encounter: Payer: Self-pay | Admitting: Cardiology

## 2017-05-22 VITALS — BP 142/70 | HR 90 | Ht 61.0 in | Wt 189.0 lb

## 2017-05-22 DIAGNOSIS — E7800 Pure hypercholesterolemia, unspecified: Secondary | ICD-10-CM | POA: Diagnosis not present

## 2017-05-22 DIAGNOSIS — I359 Nonrheumatic aortic valve disorder, unspecified: Secondary | ICD-10-CM | POA: Diagnosis not present

## 2017-05-22 DIAGNOSIS — I10 Essential (primary) hypertension: Secondary | ICD-10-CM

## 2017-05-22 DIAGNOSIS — R002 Palpitations: Secondary | ICD-10-CM

## 2017-05-22 DIAGNOSIS — I059 Rheumatic mitral valve disease, unspecified: Secondary | ICD-10-CM | POA: Diagnosis not present

## 2017-05-22 MED ORDER — METOPROLOL SUCCINATE ER 25 MG PO TB24: 75.0000 mg | ORAL_TABLET | Freq: Two times a day (BID) | ORAL | 3 refills | Status: DC

## 2017-05-22 MED ORDER — METHOCARBAMOL 500 MG PO TABS: 500.0000 mg | ORAL_TABLET | Freq: Three times a day (TID) | ORAL | 0 refills | Status: DC | PRN

## 2017-05-22 NOTE — Patient Instructions (Signed)
Medication Instructions:   INCREASE METOPROLOL TO 75 MG TWICE DAILY= 1 AND 1/2 OF THE 50 MG TABLETS TWICE DAILY  Testing/Procedures:  Your physician has requested that you have an echocardiogram. Echocardiography is a painless test that uses sound waves to create images of your heart. It provides your doctor with information about the size and shape of your heart and how well your heart's chambers and valves are working. This procedure takes approximately one hour. There are no restrictions for this procedure.    Follow-Up:  Your physician wants you to follow-up in: [place] will receive a reminder letter in the mail two months in advance. If you don't receive a letter, please call our office to schedule the follow-up appointment.   If you need a refill on your cardiac medications before your next appointment, please call your pharmacy.

## 2017-05-28 ENCOUNTER — Other Ambulatory Visit (HOSPITAL_COMMUNITY): Payer: Self-pay

## 2017-06-02 ENCOUNTER — Encounter (HOSPITAL_COMMUNITY): Payer: Self-pay | Admitting: Psychiatry

## 2017-06-02 ENCOUNTER — Ambulatory Visit (INDEPENDENT_AMBULATORY_CARE_PROVIDER_SITE_OTHER): Payer: Medicare Other | Admitting: Psychiatry

## 2017-06-02 VITALS — BP 137/82 | HR 88 | Ht 61.0 in | Wt 182.0 lb

## 2017-06-02 DIAGNOSIS — F331 Major depressive disorder, recurrent, moderate: Secondary | ICD-10-CM | POA: Diagnosis not present

## 2017-06-02 DIAGNOSIS — M797 Fibromyalgia: Secondary | ICD-10-CM

## 2017-06-02 DIAGNOSIS — Z560 Unemployment, unspecified: Secondary | ICD-10-CM | POA: Diagnosis not present

## 2017-06-02 DIAGNOSIS — Z818 Family history of other mental and behavioral disorders: Secondary | ICD-10-CM

## 2017-06-02 DIAGNOSIS — Z811 Family history of alcohol abuse and dependence: Secondary | ICD-10-CM | POA: Diagnosis not present

## 2017-06-02 MED ORDER — AMITRIPTYLINE HCL 25 MG PO TABS: 25.0000 mg | ORAL_TABLET | Freq: Every day | ORAL | 2 refills | Status: DC

## 2017-06-02 MED ORDER — ESCITALOPRAM OXALATE 20 MG PO TABS: 20.0000 mg | ORAL_TABLET | Freq: Two times a day (BID) | ORAL | 2 refills | Status: DC

## 2017-06-02 MED ORDER — ALPRAZOLAM 1 MG PO TABS: 1.0000 mg | ORAL_TABLET | Freq: Three times a day (TID) | ORAL | 1 refills | Status: DC | PRN
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
## 2017-06-05 ENCOUNTER — Ambulatory Visit (HOSPITAL_COMMUNITY): Payer: Medicare Other | Attending: Cardiology

## 2017-06-05 ENCOUNTER — Other Ambulatory Visit: Payer: Self-pay

## 2017-06-05 ENCOUNTER — Ambulatory Visit: Payer: Self-pay | Admitting: *Deleted

## 2017-06-05 DIAGNOSIS — I359 Nonrheumatic aortic valve disorder, unspecified: Secondary | ICD-10-CM | POA: Diagnosis not present

## 2017-06-05 DIAGNOSIS — I059 Rheumatic mitral valve disease, unspecified: Secondary | ICD-10-CM

## 2017-06-05 DIAGNOSIS — I080 Rheumatic disorders of both mitral and aortic valves: Secondary | ICD-10-CM | POA: Diagnosis not present

## 2017-06-05 DIAGNOSIS — I5030 Unspecified diastolic (congestive) heart failure: Secondary | ICD-10-CM | POA: Diagnosis not present

## 2017-06-05 DIAGNOSIS — I420 Dilated cardiomyopathy: Secondary | ICD-10-CM | POA: Diagnosis not present

## 2017-06-05 NOTE — Telephone Encounter (Signed)
Returned her call.   She is c/o waking up this morning with a runny nose and raspy voice.   No coughing, sore throat, earache, shortness of breath, etc.   Denies fever, chills, body aches.    He stated her husband was sick with the same thing for about 2 weeks.   He is better now.  She has not had a cold in many years so wasn't sure what she could take OTC.  She mentioned she had heart surgery in 1974 and has a mitral valve.   If she is going to have an invasive procedure they put her on antibiotics.    No invasive procedures but she is for an echocardiogram this evening at [facility].     I gave her home care advise per the protocol.   I also suggested she call her cardiologist for suggestions on something she could take.   She is going by the pharmacy today to get some nasal saline spray.   I encouraged her to ask the pharmacist for suggestions also and to let them bea  Aware of her mitral valve to help them in their decision.     She verbalized understanding and was agreeable to this plan.    I also instructed her to call us back if her symptoms became worse, she developed fever or was not getting better.     Reason for Disposition . Cold with no complications  Answer Assessment - Initial Assessment Questions 1. ONSET: "When did the nasal discharge start?"      Got up this morning sick.   My husband is sick.    My head is stopped up.  I've not had a cold for many years. 2. AMOUNT: "How much discharge is there?"      My nose is running. 3. COUGH: "Do you have a cough?" If yes, ask: "Describe the color of your sputum" (clear, white, yellow, green)     No cough 4. RESPIRATORY DISTRESS: "Describe your breathing."      I just feel stopped up. 5. FEVER: "Do you have a fever?" If so, ask: "What is your temperature, how was it measured, and when did it start?"     No 6. SEVERITY: "Overall, how bad are you feeling right now?" (e.g., doesn't interfere with normal activities, staying home from  school/work, staying in bed)       7. OTHER SYMPTOMS: "Do you have any other symptoms?" (e.g., sore throat, earache, wheezing, vomiting)     No sore throat.    8. PREGNANCY: "Is there any chance you are pregnant?" "When was your last menstrual period?"     N/A  Protocols used: COMMON COLD-A-AH

## 2017-06-09 ENCOUNTER — Telehealth: Payer: Self-pay | Admitting: Cardiology

## 2017-06-09 NOTE — Telephone Encounter (Signed)
-----   Message from Lelon Perla, MD sent at 06/06/2017  8:20 AM EDT ----- Normal LV function; MV repair with mildly elevated mean gradient compared to previous but only mild MR; will do fu studies in the future Kirk Ruths

## 2017-06-09 NOTE — Telephone Encounter (Signed)
Advised patient of results.  

## 2017-06-09 NOTE — Telephone Encounter (Signed)
Alexandria Leach is calling to get the results of he Echocardiogram . Please Call

## 2017-06-13 ENCOUNTER — Other Ambulatory Visit: Payer: Self-pay | Admitting: Rheumatology

## 2017-06-13 ENCOUNTER — Other Ambulatory Visit (HOSPITAL_COMMUNITY): Payer: Self-pay | Admitting: Psychiatry

## 2017-06-16 NOTE — Telephone Encounter (Signed)
Attempted to contact the patient and left message for patient to call the office.  

## 2017-07-30 ENCOUNTER — Encounter (INDEPENDENT_AMBULATORY_CARE_PROVIDER_SITE_OTHER): Payer: Self-pay

## 2017-07-30 ENCOUNTER — Encounter: Payer: Self-pay | Admitting: Family Medicine

## 2017-07-30 ENCOUNTER — Ambulatory Visit (INDEPENDENT_AMBULATORY_CARE_PROVIDER_SITE_OTHER): Payer: Medicare Other | Admitting: Family Medicine

## 2017-07-30 VITALS — BP 122/82 | HR 90 | Temp 98.1°F | Ht 61.0 in | Wt 185.2 lb

## 2017-07-30 DIAGNOSIS — M62830 Muscle spasm of back: Secondary | ICD-10-CM

## 2017-07-30 MED ORDER — TIZANIDINE HCL 2 MG PO TABS: 2.0000 mg | ORAL_TABLET | Freq: Three times a day (TID) | ORAL | 0 refills | Status: DC | PRN

## 2017-07-30 NOTE — Progress Notes (Signed)
BP 122/82 (BP Location: Left Arm, Patient Position: Sitting, Cuff Size: Normal)   Pulse 90   Temp 98.1 F (36.7 C) (Oral)   Ht 5\' 1"  (1.549 m)   Wt 185 lb 4 oz (84 kg)   SpO2 97%   BMI 35.00 kg/m    CC: my back's been having spasms Subjective:    Patient ID: Alexandria Leach, female    DOB: 30-Jul-1950, 67 y.o.   MRN: [id number]  HPI: Alexandria Leach is a 67 y.o. female presenting on 07/30/2017 for Spasms (C/o muscle spasms in lower to mid back on both sides. Started on 07/25/17. Tried tramadol and heating pad. Tramadol, not helpful but heating pad gave minor relief.)   5-6d h/o worsening lower back spasms that travel from lumbar region to thoracic region. Both sides. Denies other muscle or joint pains currently. Voiding well. No abd pain. No fevers/chills.  Denies inciting trauma/injury or falls.  Has tried tramadol - didn't help.  Has tried heating pad - temporary relief.  Worse with prolonged standing or turning over in bed. Robaxin Rx 05/2017 was not covered by insurance.  Relevant past medical, surgical, family and social history reviewed and updated as indicated. Interim medical history since our last visit reviewed. Allergies and medications reviewed and updated. Outpatient Medications Prior to Visit  Medication Sig Dispense Refill  . ALPRAZolam (XANAX) 1 MG tablet Take 1 tablet (1 mg total) by mouth 3 (three) times daily as needed for anxiety. 270 tablet 1  . amitriptyline (ELAVIL) 25 MG tablet Take 1 tablet (25 mg total) by mouth at bedtime. 90 tablet 2  . amLODipine (NORVASC) 10 MG tablet TAKE 1 TABLET BY MOUTH  DAILY 90 tablet 1  . antiseptic oral rinse (BIOTENE) LIQD 15 mLs by Mouth Rinse route at bedtime.     Marland Kitchen aspirin 325 MG EC tablet Take 325 mg by mouth daily.     . cholecalciferol (VITAMIN D) 1000 UNITS tablet Take 1,000 Units by mouth daily.     . diclofenac sodium (VOLTAREN) 1 % GEL Apply 1 application topically 2 (two) times daily as needed (pain).    Marland Kitchen  escitalopram (LEXAPRO) 20 MG tablet Take 1 tablet (20 mg total) by mouth 2 (two) times daily. 180 tablet 2  . ezetimibe (ZETIA) 10 MG tablet TAKE 1 TABLET BY MOUTH AT  BEDTIME 90 tablet 2  . famotidine (PEPCID) 20 MG tablet TAKE 1 TABLET BY MOUTH TWO  TIMES DAILY 180 tablet 2  . furosemide (LASIX) 20 MG tablet TAKE 1 TABLET BY MOUTH  DAILY AS NEEDED FOR EDEMA 90 tablet 3  . gabapentin (NEURONTIN) 300 MG capsule TAKE 2 CAPSULES EVERY  MORNING, 1 CAPSULE EVERY  AFTERNOON AND 2 CAPSULES AT BEDTIME. 450 capsule 1  . hydrochlorothiazide (MICROZIDE) 12.5 MG capsule TAKE 1 CAPSULE BY MOUTH  DAILY 90 capsule 3  . lovastatin (MEVACOR) 10 MG tablet Take 1 tablet (10 mg total) by mouth every Monday, Wednesday, and Friday. 70 tablet 1  . metoprolol succinate (TOPROL-XL) 25 MG 24 hr tablet Take 3 tablets (75 mg total) by mouth 2 (two) times daily. Take with or immediately following a meal 540 tablet 3  . Polyethyl Glycol-Propyl Glycol (SYSTANE) 0.4-0.3 % GEL Place 1 drop into both eyes 2 (two) times daily.     . RESTASIS 0.05 % ophthalmic emulsion INSTILL ONE DROP IN BOTH  EYES TWO TIMES DAILY 180 each 0  . traMADol (ULTRAM) 50 MG tablet Take 1 tablet (50 mg  total) by mouth 3 (three) times daily. 90 tablet 3  . methocarbamol (ROBAXIN) 500 MG tablet Take 1 tablet (500 mg total) by mouth 3 (three) times daily as needed for muscle spasms (sedation precautions). 30 tablet 0   No facility-administered medications prior to visit.     Past Medical History:  Diagnosis Date  . Ankle fracture, left 02/19/2015   from a fall  . Anxiety   . CHF (congestive heart failure) ([place])   . CKD (chronic kidney disease) stage 3, GFR 30-59 ml/min [facility])    saw nephrologist Dr Harden Mo  . DDD (degenerative disc disease), cervical   . Depression   . Fibromyalgia   . Glaucoma    s/p surgery, sees ophtho Q6 mo  . 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 History of  kidney stones 2010  . History of pulmonary embolism 2001, 2006   completed coumadin courses  . History of rheumatic fever x3  . HLD (hyperlipidemia)   . HTN (hypertension)   . Insomnia   . Lung nodule 09/22/2012   RLL - 54mm, stable since 2014. Thought benign.   . Osteoarthritis    shoulders and knees, not RA per Dr Estanislado Pandy, positive ANA, positive Ro  . Osteopenia 09/18/2015   DEXA T -1.1 hip, -0.2 spine 08/2015   . Personal history of urinary calculi latest 2014  . Pneumonia 12/02/2011  . PONV (postoperative nausea and vomiting)   . Refusal of blood transfusions as patient is Jehovah's Witness   . Rheumatic heart disease 1980   s/p mitral valve repair 1980  . Sjogren's syndrome ([place])     Per HPI unless specifically indicated in ROS section below Review of Systems     Objective:    BP 122/82 (BP Location: Left Arm, Patient Position: Sitting, Cuff Size: Normal)   Pulse 90   Temp 98.1 F (36.7 C) (Oral)   Ht 5\' 1"  (1.549 m)   Wt 185 lb 4 oz (84 kg)   SpO2 97%   BMI 35.00 kg/m   Wt Readings from Last 3 Encounters:  07/30/17 185 lb 4 oz (84 kg)  06/02/17 182 lb (82.6 kg)  05/22/17 189 lb (85.7 kg)    Physical Exam  Constitutional: She appears well-developed and well-nourished. No distress.  Musculoskeletal: Normal range of motion.  Tenderness to palpation midline lower lumbar spine No significant paraspinous mm tenderness (other than typical FM pain) Neg SLR bilaterally. No pain with int/ext rotation at hip. No pain at GTB or sciatic notch bilaterally.  Tender to palpation bilateral SIJ  Neurological: She is alert.  Reflex Scores:      Patellar reflexes are 2+ on the right side and 2+ on the left side. 5/5 strength BLE  Nursing note and vitals reviewed.     Assessment & Plan:   Problem List Items Addressed This Visit    Spasm of muscle of lower back - Primary    Anticipate lumbar paraspinous mm spasms.  Previously robaxin not covered by insurance - will trial  zanaflex 2mg  tab BID PRN. Sedation precautions reviewed. Continue heating pad as well as provided with lumbar stretching/strengthening exercises to do at home. Update with effect. No red flags today, reassuring exam.           Meds ordered this encounter  Medications  . tiZANidine (ZANAFLEX) 2 MG tablet    Sig: Take 1 tablet (2 mg total) by mouth 3 (three) times  daily as needed for muscle spasms (sedation precautions).    Dispense:  30 tablet    Refill:  0   No orders of the defined types were placed in this encounter.   Follow up plan: Return if symptoms worsen or fail to improve.  Ria Bush, MD

## 2017-07-30 NOTE — Assessment & Plan Note (Signed)
Anticipate lumbar paraspinous mm spasms.  Previously robaxin not covered by insurance - will trial zanaflex 2mg  tab BID PRN. Sedation precautions reviewed. Continue heating pad as well as provided with lumbar stretching/strengthening exercises to do at home. Update with effect. No red flags today, reassuring exam.

## 2017-07-30 NOTE — Patient Instructions (Signed)
I think this is muscle spasms of the lower back Try exercises provided today and different muscle relaxant. Let me know if you get notice insurance won't cover this one.

## 2017-08-07 ENCOUNTER — Other Ambulatory Visit: Payer: Self-pay | Admitting: Rheumatology

## 2017-08-07 ENCOUNTER — Other Ambulatory Visit: Payer: Self-pay | Admitting: Family Medicine

## 2017-08-07 DIAGNOSIS — Z1231 Encounter for screening mammogram for malignant neoplasm of breast: Secondary | ICD-10-CM

## 2017-08-08 ENCOUNTER — Telehealth: Payer: Self-pay | Admitting: Rheumatology

## 2017-08-08 DIAGNOSIS — Z5181 Encounter for therapeutic drug level monitoring: Secondary | ICD-10-CM

## 2017-08-08 NOTE — Telephone Encounter (Signed)
Patient advised we will need a UDS before we can refill per Tramadol. Patient verbalized understanding. Patient will come to update on 08/12/17.

## 2017-08-08 NOTE — Telephone Encounter (Signed)
Patient needs to update UDS prior to refill. Left message for patient to contact the office.

## 2017-08-08 NOTE — Telephone Encounter (Signed)
Patient returning your call in reference to a prescription she has requested. Please call when available.

## 2017-08-12 ENCOUNTER — Other Ambulatory Visit: Payer: Self-pay

## 2017-08-12 DIAGNOSIS — Z5181 Encounter for therapeutic drug level monitoring: Secondary | ICD-10-CM

## 2017-08-14 DIAGNOSIS — H16223 Keratoconjunctivitis sicca, not specified as Sjogren's, bilateral: Secondary | ICD-10-CM | POA: Diagnosis not present

## 2017-08-15 LAB — PAIN MGMT, PROFILE 5 W/CONF, U
ALPHAHYDROXYMIDAZOLAM: NEGATIVE ng/mL (ref <50)
ALPHAHYDROXYTRIAZOLAM: NEGATIVE ng/mL (ref <50)
Alphahydroxyalprazolam: 510 ng/mL — ABNORMAL HIGH (ref <25)
Aminoclonazepam: NEGATIVE ng/mL (ref <25)
Amphetamines: NEGATIVE ng/mL (ref <500)
BARBITURATES: NEGATIVE ng/mL (ref <300)
BENZODIAZEPINES: POSITIVE ng/mL — AB (ref <100)
COCAINE METABOLITE: NEGATIVE ng/mL (ref <150)
Creatinine: 86.8 mg/dL
HYDROXYETHYLFLURAZEPAM: NEGATIVE ng/mL (ref <50)
LORAZEPAM: NEGATIVE ng/mL (ref <50)
METHADONE METABOLITE: NEGATIVE ng/mL (ref <100)
Marijuana Metabolite: NEGATIVE ng/mL (ref <20)
NORDIAZEPAM: NEGATIVE ng/mL (ref <50)
OPIATES: NEGATIVE ng/mL (ref <100)
OXAZEPAM: NEGATIVE ng/mL (ref <50)
Oxidant: NEGATIVE ug/mL (ref <200)
Oxycodone: NEGATIVE ng/mL (ref <100)
Temazepam: NEGATIVE ng/mL (ref <50)
pH: 6.93 (ref 4.5–9.0)

## 2017-08-15 LAB — PAIN MGMT, TRAMADOL W/MEDMATCH, U
DESMETHYLTRAMADOL: 615 ng/mL — AB (ref <100)
Tramadol: 936 ng/mL — ABNORMAL HIGH (ref <100)

## 2017-08-27 ENCOUNTER — Ambulatory Visit
Admission: RE | Admit: 2017-08-27 | Discharge: 2017-08-27 | Disposition: A | Discharge status: Home / Self Care | Payer: Medicare Other | Source: Ambulatory Visit | Attending: Family Medicine | Admitting: Family Medicine

## 2017-08-27 ENCOUNTER — Encounter: Payer: Self-pay | Admitting: Family Medicine

## 2017-08-27 DIAGNOSIS — Z1231 Encounter for screening mammogram for malignant neoplasm of breast: Secondary | ICD-10-CM

## 2017-08-27 LAB — HM MAMMOGRAPHY

## 2017-09-02 ENCOUNTER — Ambulatory Visit (HOSPITAL_COMMUNITY): Payer: Medicare Other | Admitting: Psychiatry

## 2017-09-03 ENCOUNTER — Telehealth: Payer: Self-pay | Admitting: Rheumatology

## 2017-09-03 ENCOUNTER — Other Ambulatory Visit: Payer: Self-pay | Admitting: Rheumatology

## 2017-09-03 NOTE — Telephone Encounter (Signed)
Last Visit: 05/16/17 Next visit due in 11/2017. Message sent to the front to schedule patient  UDS: 08/12/17 Narc Agreement: 10/16/16  Okay to refill Tramadol?

## 2017-09-03 NOTE — Telephone Encounter (Signed)
-----   Message from Carole Binning, LPN sent at 3/41/9379 11:06 AM EDT ----- Regarding: Please schedule patient for follow up visit.  Please schedule patient for follow up visit. Patient due September 2019. Thanks!

## 2017-09-03 NOTE — Telephone Encounter (Signed)
ok 

## 2017-09-03 NOTE — Telephone Encounter (Signed)
Left messages on both home and mobile # for patient to call and schedule her follow-up appointment due in September.

## 2017-09-04 ENCOUNTER — Ambulatory Visit (HOSPITAL_COMMUNITY): Payer: Medicare Other | Admitting: Psychiatry

## 2017-09-04 ENCOUNTER — Encounter (INDEPENDENT_AMBULATORY_CARE_PROVIDER_SITE_OTHER): Payer: Self-pay | Admitting: Physician Assistant

## 2017-09-04 ENCOUNTER — Ambulatory Visit (INDEPENDENT_AMBULATORY_CARE_PROVIDER_SITE_OTHER): Payer: Medicare Other

## 2017-09-04 ENCOUNTER — Ambulatory Visit (INDEPENDENT_AMBULATORY_CARE_PROVIDER_SITE_OTHER): Payer: Medicare Other | Admitting: Physician Assistant

## 2017-09-04 ENCOUNTER — Telehealth: Payer: Self-pay | Admitting: Rheumatology

## 2017-09-04 DIAGNOSIS — G8929 Other chronic pain: Secondary | ICD-10-CM

## 2017-09-04 DIAGNOSIS — M545 Low back pain: Secondary | ICD-10-CM | POA: Diagnosis not present

## 2017-09-04 MED ORDER — CYCLOBENZAPRINE HCL 5 MG PO TABS: 5.0000 mg | ORAL_TABLET | Freq: Three times a day (TID) | ORAL | 0 refills | Status: DC | PRN

## 2017-09-04 MED ORDER — METHYLPREDNISOLONE 4 MG PO TBPK: ORAL_TABLET | ORAL | 0 refills | Status: DC

## 2017-09-04 NOTE — Progress Notes (Signed)
Office Visit Note   Patient: Alexandria Leach           Date of Birth: [birth year]           MRN: [id number] Visit Date: 09/04/2017              Requested by: Ria Bush, MD [place], [place] [address] PCP: Ria Bush, MD   Assessment & Plan: Visit Diagnoses:  1. Chronic low back pain, unspecified back pain laterality, with sciatica presence unspecified     Plan: At this point, I will call in a steroid taper as well as a muscle relaxer.  We will also try and set up home health physical therapy for the patient as she is unable to drive due to her condition.  She will need therapy for gait stabilization and core strengthening.  I will provide the patient with an outpatient physical therapy prescription should home health not be approved.  She will follow-up with [place] as needed.  Follow-Up Instructions: Return if symptoms worsen or fail to improve.   Orders:  Orders Placed This Encounter  Procedures  . XR Lumbar Spine 2-3 Views   Meds ordered this encounter  Medications  . methylPREDNISolone (MEDROL DOSEPAK) 4 MG TBPK tablet    Sig: Take as directed    Dispense:  21 tablet    Refill:  0  . cyclobenzaprine (FLEXERIL) 5 MG tablet    Sig: Take 1 tablet (5 mg total) by mouth 3 (three) times daily as needed for muscle spasms.    Dispense:  30 tablet    Refill:  0      Procedures: No procedures performed   Clinical Data: No additional findings.   Subjective: Chief Complaint  Patient presents with  . Lower Back - Pain    HPI patient is a pleasant 67 year old female who presents to our clinic today with right sided lower back pain.  This began approximately 1 year ago without any known injury or change in activity.  Her symptoms have worsened.  Occasionally, she will have pain that shoots to the left side of her lower back as well.  No associated weakness, although she does note that she has fallen 3 times this week from her legs giving way.   Pain is worse when she sits or stands for too long time.  Nothing seems to make this better although a heating pad helps a little.  She denies any numbness, tingling or burning.  No bowel or bladder incontinence and no saddle paresthesias.  No previous epidural steroid injections or surgical intervention to the lumbar spine.   Review of Systems as detailed in HPI.  All others reviewed and are negative.   Objective: Vital Signs: There were no vitals taken for this visit.  Physical Exam well-developed and well-nourished female in no acute distress.  Alert and oriented x3.  Ortho Exam examination of her lumbar spine reveals no spinous tenderness.  She does have slight tenderness over the paraspinous muscles on the right.  Minimally positive straight leg raise on the right.  No focal weakness.  Negative the left.  She is neurovascularly intact distally.  Specialty Comments:  No specialty comments available.  Imaging: Xr Lumbar Spine 2-3 Views  Result Date: 09/04/2017 X-rays show decreased disc space at L2 and 3 as well as L5 and S1 which appear to be sacralized.    PMFS History: Patient Active Problem List   Diagnosis Date Noted  . Spasm of  muscle of lower back 07/30/2017  . Anterior neck pain 05/14/2017  . Lymphadenopathy of head and neck region 10/29/2016  . High risk medication use 06/05/2016  . Peripheral neuropathy 01/30/2016  . DNR no code (do not resuscitate) 12/08/2015  . 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 TIA (transient ischemic attack) 10/12/2015  . Abnormal urinalysis 10/12/2015  . Mild mitral regurgitation 10/12/2015  . Osteopenia 09/18/2015  . Right arm pain 08/29/2015  . Fall   . Trimalleolar fracture of left ankle 02/23/2015  . Right sided sciatica 02/21/2015  . Imbalance 01/12/2015  . Transaminitis 12/24/2014  . DDD (degenerative disc disease), cervical   . Health maintenance examination 10/18/2014  . Advanced care planning/counseling  discussion 10/18/2014  . Medicare annual wellness visit, initial 10/18/2014  . Refusal of blood transfusions as patient is Jehovah's Witness   . Sjogren's syndrome ([place])   . CKD (chronic kidney disease) stage 3, GFR 30-59 ml/min ([facility])   . Glaucoma   . Fibromyalgia   . Osteoarthritis   . History of pulmonary embolism   . Lung nodule 09/22/2012  . Diastolic CHF ([place]) 38/46/6599  . Aortic stenosis 04/22/2011  . Palpitations 08/10/2010  . Dyspnea 08/10/2010  . HOT FLASHES 06/28/2008  . Trochanteric bursitis of right hip 06/28/2008  . ARTHRALGIA 07/01/2007  . BACK PAIN, LEFT 08/25/2006  . GAD (generalized anxiety disorder) 03/28/2006  . MITRAL VALVE REPLACEMENT, HX OF 03/28/2006  . TAH/BSO, HX OF 03/28/2006  . HYPERCHOLESTEROLEMIA 12/31/2005  . MDD (major depressive disorder), recurrent episode ([place]) 12/31/2005  . RHEUMATIC HEART DISEASE 12/31/2005  . Essential hypertension 12/31/2005  . Chronic insomnia 12/31/2005   Past Medical History:  Diagnosis Date  . Ankle fracture, left 02/19/2015   from a fall  . Anxiety   . CHF (congestive heart failure) ([place])   . CKD (chronic kidney disease) stage 3, GFR 30-59 ml/min [facility])    saw nephrologist Dr Harden Mo  . DDD (degenerative disc disease), cervical   . Depression   . Fibromyalgia   . Glaucoma    s/p surgery, sees ophtho Q6 mo  . 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 History of kidney stones 2010  . History of pulmonary embolism 2001, 2006   completed coumadin courses  . History of rheumatic fever x3  . HLD (hyperlipidemia)   . HTN (hypertension)   . Insomnia   . Lung nodule 09/22/2012   RLL - 61mm, stable since 2014. Thought benign.   . Osteoarthritis    shoulders and knees, not RA per Dr Estanislado Pandy, positive ANA, positive Ro  . Osteopenia 09/18/2015   DEXA T -1.1 hip, -0.2 spine 08/2015   . Personal history of urinary calculi latest 2014  . Pneumonia 12/02/2011    . PONV (postoperative nausea and vomiting)   . Refusal of blood transfusions as patient is Jehovah's Witness   . Rheumatic heart disease 1980   s/p mitral valve repair 1980  . Sjogren's syndrome ([place])     Family History  Problem Relation Age of Onset  . Lupus Sister        and niece  . Cancer Mother        lung (nonsmoker)  . CAD Mother        MI in her 3s  . ALS Mother   . Kidney disease Father   . Alcohol abuse Father   . Diabetes Father   . Cancer Brother  bone  . Diabetes Sister   . Stroke Sister   . Cancer Maternal Uncle        bone  . Depression Sister   . Kidney failure Other        on HD  . Diabetes Brother   . Heart attack Brother   . Stroke Maternal Grandmother     Past Surgical History:  Procedure Laterality Date  . BREAST BIOPSY Right 2006   benign  . CHOLECYSTECTOMY  11/27/2011   Procedure: LAPAROSCOPIC CHOLECYSTECTOMY WITH INTRAOPERATIVE CHOLANGIOGRAM;  Surgeon: Adin Hector, MD;  Location: [facility];  Service: General;  Laterality: N/A;  laparoscopic cholecystectomy with choleangiogram umbilical hernia repair  . COLONOSCOPY  07/2014   WNL Amedeo Plenty)  . dexa  08/2012   normal per patient - no records available  . MITRAL VALVE REPAIR  1980   open heart  . ORIF ANKLE FRACTURE Left 02/26/2015   Procedure: OPEN REDUCTION INTERNAL FIXATION (ORIF) LEFT TRIMALLEOLAR ANKLE FRACTURE;  Surgeon: [name], MD;  Location: [facility];  Service: Orthopedics;  Laterality: Left;  . TUBAL LIGATION  1980  . UMBILICAL HERNIA REPAIR  11/27/2011   Procedure: HERNIA REPAIR UMBILICAL ADULT;  Surgeon: Adin Hector, MD;  Location: [facility];  Service: General;  Laterality: N/A;  . [place]   for fibroids -- partial, ovaries remain   Social History   Occupational History    Employer: UNEMPLOYED    Comment: Disability  Tobacco Use  . Smoking status: Never Smoker  . Smokeless tobacco: Never Used  Substance and Sexual Activity  . Alcohol use: No     Alcohol/week: 0.0 oz  . Drug use: No  . Sexual activity: Not Currently    Birth control/protection: Surgical

## 2017-09-04 NOTE — Telephone Encounter (Signed)
A new prescription has been faxed to the pharmacy.

## 2017-09-04 NOTE — Telephone Encounter (Signed)
Christina from [facility] left a voicemail stating this is their final attempt to follow-up on a denial for the prescription of Tramadol 50 mg.  The denial stated that a new prescription would follow and we haven't received a new prescription.   Please return our call within 24 hours or send in a new prescription.   # [phone] and use order# [id number]

## 2017-09-08 ENCOUNTER — Emergency Department (HOSPITAL_COMMUNITY): Payer: Medicare Other

## 2017-09-08 ENCOUNTER — Emergency Department (HOSPITAL_COMMUNITY)
Admission: EM | Admit: 2017-09-08 | Discharge: 2017-09-08 | Disposition: A | Discharge status: Home / Self Care | Payer: Medicare Other | Attending: Emergency Medicine | Admitting: Emergency Medicine

## 2017-09-08 ENCOUNTER — Encounter (HOSPITAL_COMMUNITY): Payer: Self-pay | Admitting: Emergency Medicine

## 2017-09-08 DIAGNOSIS — I509 Heart failure, unspecified: Secondary | ICD-10-CM | POA: Diagnosis not present

## 2017-09-08 DIAGNOSIS — S0990XA Unspecified injury of head, initial encounter: Secondary | ICD-10-CM | POA: Diagnosis not present

## 2017-09-08 DIAGNOSIS — Y999 Unspecified external cause status: Secondary | ICD-10-CM | POA: Diagnosis not present

## 2017-09-08 DIAGNOSIS — Z7982 Long term (current) use of aspirin: Secondary | ICD-10-CM | POA: Diagnosis not present

## 2017-09-08 DIAGNOSIS — I130 Hypertensive heart and chronic kidney disease with heart failure and stage 1 through stage 4 chronic kidney disease, or unspecified chronic kidney disease: Secondary | ICD-10-CM | POA: Diagnosis not present

## 2017-09-08 DIAGNOSIS — W108XXA Fall (on) (from) other stairs and steps, initial encounter: Secondary | ICD-10-CM | POA: Diagnosis not present

## 2017-09-08 DIAGNOSIS — Z79899 Other long term (current) drug therapy: Secondary | ICD-10-CM | POA: Diagnosis not present

## 2017-09-08 DIAGNOSIS — S199XXA Unspecified injury of neck, initial encounter: Secondary | ICD-10-CM | POA: Diagnosis not present

## 2017-09-08 DIAGNOSIS — N183 Chronic kidney disease, stage 3 (moderate): Secondary | ICD-10-CM | POA: Diagnosis not present

## 2017-09-08 DIAGNOSIS — W19XXXA Unspecified fall, initial encounter: Secondary | ICD-10-CM

## 2017-09-08 DIAGNOSIS — Y929 Unspecified place or not applicable: Secondary | ICD-10-CM | POA: Diagnosis not present

## 2017-09-08 DIAGNOSIS — Y9301 Activity, walking, marching and hiking: Secondary | ICD-10-CM | POA: Diagnosis not present

## 2017-09-08 DIAGNOSIS — S0083XA Contusion of other part of head, initial encounter: Secondary | ICD-10-CM | POA: Diagnosis not present

## 2017-09-08 DIAGNOSIS — M545 Low back pain: Secondary | ICD-10-CM | POA: Diagnosis not present

## 2017-09-08 DIAGNOSIS — S069X9A Unspecified intracranial injury with loss of consciousness of unspecified duration, initial encounter: Secondary | ICD-10-CM | POA: Diagnosis not present

## 2017-09-08 DIAGNOSIS — I10 Essential (primary) hypertension: Secondary | ICD-10-CM | POA: Diagnosis not present

## 2017-09-08 DIAGNOSIS — M546 Pain in thoracic spine: Secondary | ICD-10-CM | POA: Diagnosis not present

## 2017-09-08 LAB — BASIC METABOLIC PANEL
Anion gap: 10 (ref 5–15)
BUN: 6 mg/dL (ref 6–20)
CO2: 23 mmol/L (ref 22–32)
Calcium: 9.5 mg/dL (ref 8.9–10.3)
Chloride: 106 mmol/L (ref 101–111)
Creatinine, Ser: 1.14 mg/dL — ABNORMAL HIGH (ref 0.44–1.00)
GFR calc Af Amer: 57 mL/min — ABNORMAL LOW (ref >60)
GFR calc non Af Amer: 49 mL/min — ABNORMAL LOW (ref >60)
Glucose, Bld: 90 mg/dL (ref 65–99)
Potassium: 3.8 mmol/L (ref 3.5–5.1)
Sodium: 139 mmol/L (ref 135–145)

## 2017-09-08 LAB — URINALYSIS, ROUTINE W REFLEX MICROSCOPIC
Bilirubin Urine: NEGATIVE
Glucose, UA: NEGATIVE mg/dL
Hgb urine dipstick: NEGATIVE
Ketones, ur: NEGATIVE mg/dL
Nitrite: NEGATIVE
Protein, ur: NEGATIVE mg/dL
Specific Gravity, Urine: 1.012 (ref 1.005–1.030)
pH: 7 (ref 5.0–8.0)

## 2017-09-08 LAB — CBC
HCT: 41.3 % (ref 36.0–46.0)
Hemoglobin: 12.7 g/dL (ref 12.0–15.0)
MCH: 26.5 pg (ref 26.0–34.0)
MCHC: 30.8 g/dL (ref 30.0–36.0)
MCV: 86 fL (ref 78.0–100.0)
Platelets: 257 10*3/uL (ref 150–400)
RBC: 4.8 MIL/uL (ref 3.87–5.11)
RDW: 14.9 % (ref 11.5–15.5)
WBC: 4.4 10*3/uL (ref 4.0–10.5)

## 2017-09-08 NOTE — Discharge Instructions (Signed)
You were seen in the emergency department after falls last week.  The imaging performed in the emergency department did not show bleeds in your brain, fractures, or dislocations.  There were some abnormal findings over your thyroid notified on the CT of your neck, you will need an ultrasound of your thyroid which can be ordered by your primary care provider.  Please be sure to mention this to them at your follow-up appointment.  Follow-up with your primary care provider in the next 3 days for reevaluation.  Return to the ER for new or worsening symptoms including but not limited to numbness, weakness, passing out, chest pain, trouble breathing, or any other concerns that you may have.

## 2017-09-08 NOTE — ED Triage Notes (Signed)
Pt states she has fallen a lot recently.She is unsure if she had a syncopal episode on Thursday- which caused her to fall. States she really isn't sure how she fell. But she hit her head and has had a headache all weekend. Denies CP/SOB. Neuro intact

## 2017-09-08 NOTE — ED Provider Notes (Signed)
[place] EMERGENCY DEPARTMENT Provider Note   CSN: [id number] Arrival date & time: 09/08/17  5573     History   Chief Complaint Chief Complaint  Patient presents with  . Fall    HPI Alexandria Leach is a 67 y.o. female with a hx of rheumatic heart disease s/p mitral valve repair, CHF, stage III CKD, depression, pulmonary embolism no longer on anticoagulation, HTN, hyperlipidemia, Sjogren's syndrome, TIA, and generalized anxiety disorder who presents to the ED s/p fall x 2 in the last 1 week. Patient states that on Tuesday last week (6 days ago) she was walking down a step onto a porch, tripped, and fell to her L side. She was able to get up and ambulate following the fall, no specific residual injuries or complaints. She states that on Thursday last week (5 days ago), she went out to get the mail, came back inside the house and fell while walking. She states she is unsure how exactly she fell, she does not thinks he tripped over anything, she was not having any dizziness, lightheadedness, chest pain, or dyspnea. She states that she did not feel like she was going to pass out, she just fell. She states she hit the L side of her head on the counter during the fall and had a brief LOC after the head injury. The fall was unwitnessed, she was able to get up on her own. She has had a mild headache to the L side of her head that feels like pressure, rates it a 5/10 in severity at present, no specific alleviating/aggravating factors. She does not recall any other specific injuries from her falls. Denies change in vision, numbness, or weakness.   HPI  Past Medical History:  Diagnosis Date  . Ankle fracture, left 02/19/2015   from a fall  . Anxiety   . CHF (congestive heart failure) ([place])   . CKD (chronic kidney disease) stage 3, GFR 30-59 ml/min [facility])    saw nephrologist Dr Harden Mo  . DDD (degenerative disc disease), cervical   . Depression   . Fibromyalgia   . Glaucoma    s/p surgery, sees ophtho Q6 mo  . 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 History of kidney stones 2010  . History of pulmonary embolism 2001, 2006   completed coumadin courses  . History of rheumatic fever x3  . HLD (hyperlipidemia)   . HTN (hypertension)   . Insomnia   . Lung nodule 09/22/2012   RLL - 67mm, stable since 2014. Thought benign.   . Osteoarthritis    shoulders and knees, not RA per Dr Estanislado Pandy, positive ANA, positive Ro  . Osteopenia 09/18/2015   DEXA T -1.1 hip, -0.2 spine 08/2015   . Personal history of urinary calculi latest 2014  . Pneumonia 12/02/2011  . PONV (postoperative nausea and vomiting)   . Refusal of blood transfusions as patient is Jehovah's Witness   . Rheumatic heart disease 1980   s/p mitral valve repair 1980  . Sjogren's syndrome [facility])     Patient Active Problem List   Diagnosis Date Noted  . Spasm of muscle of lower back 07/30/2017  . Anterior neck pain 05/14/2017  . Lymphadenopathy of head and neck region 10/29/2016  . High risk medication use 06/05/2016  . Peripheral neuropathy 01/30/2016  . DNR no code (do not resuscitate) 12/08/2015  . 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 TIA (  transient ischemic attack) 10/12/2015  . Abnormal urinalysis 10/12/2015  . Mild mitral regurgitation 10/12/2015  . Osteopenia 09/18/2015  . Right arm pain 08/29/2015  . Fall   . Trimalleolar fracture of left ankle 02/23/2015  . Right sided sciatica 02/21/2015  . Imbalance 01/12/2015  . Transaminitis 12/24/2014  . DDD (degenerative disc disease), cervical   . Health maintenance examination 10/18/2014  . Advanced care planning/counseling discussion 10/18/2014  . Medicare annual wellness visit, initial 10/18/2014  . Refusal of blood transfusions as patient is Jehovah's Witness   . Sjogren's syndrome ([place])   . CKD (chronic kidney disease) stage 3, GFR 30-59 ml/min ([facility])   .  Glaucoma   . Fibromyalgia   . Osteoarthritis   . History of pulmonary embolism   . Lung nodule 09/22/2012  . Diastolic CHF ([place]) 53/66/4403  . Aortic stenosis 04/22/2011  . Palpitations 08/10/2010  . Dyspnea 08/10/2010  . HOT FLASHES 06/28/2008  . Trochanteric bursitis of right hip 06/28/2008  . ARTHRALGIA 07/01/2007  . BACK PAIN, LEFT 08/25/2006  . GAD (generalized anxiety disorder) 03/28/2006  . MITRAL VALVE REPLACEMENT, HX OF 03/28/2006  . TAH/BSO, HX OF 03/28/2006  . HYPERCHOLESTEROLEMIA 12/31/2005  . MDD (major depressive disorder), recurrent episode ([place]) 12/31/2005  . RHEUMATIC HEART DISEASE 12/31/2005  . Essential hypertension 12/31/2005  . Chronic insomnia 12/31/2005    Past Surgical History:  Procedure Laterality Date  . BREAST BIOPSY Right 2006   benign  . CHOLECYSTECTOMY  11/27/2011   Procedure: LAPAROSCOPIC CHOLECYSTECTOMY WITH INTRAOPERATIVE CHOLANGIOGRAM;  Surgeon: Adin Hector, MD;  Location: [place];  Service: General;  Laterality: N/A;  laparoscopic cholecystectomy with choleangiogram umbilical hernia repair  . COLONOSCOPY  07/2014   WNL Amedeo Plenty)  . dexa  08/2012   normal per patient - no records available  . MITRAL VALVE REPAIR  1980   open heart  . ORIF ANKLE FRACTURE Left 02/26/2015   Procedure: OPEN REDUCTION INTERNAL FIXATION (ORIF) LEFT TRIMALLEOLAR ANKLE FRACTURE;  Surgeon: [name], MD;  Location: [place];  Service: Orthopedics;  Laterality: Left;  . TUBAL LIGATION  1980  . UMBILICAL HERNIA REPAIR  11/27/2011   Procedure: HERNIA REPAIR UMBILICAL ADULT;  Surgeon: Adin Hector, MD;  Location: [place];  Service: General;  Laterality: N/A;  . [place]   for fibroids -- partial, ovaries remain     OB History   None      Home Medications    Prior to Admission medications   Medication Sig Start Date End Date Taking? Authorizing Provider  ALPRAZolam Duanne Moron) 1 MG tablet Take 1 tablet (1 mg total) by mouth 3 (three) times daily  as needed for anxiety. 06/02/17 06/02/18 Yes Cloria Spring, MD  amitriptyline (ELAVIL) 25 MG tablet Take 1 tablet (25 mg total) by mouth at bedtime. 06/02/17  Yes Cloria Spring, MD  amLODipine (NORVASC) 10 MG tablet TAKE 1 TABLET BY MOUTH  DAILY 01/20/17  Yes Ria Bush, MD  antiseptic oral rinse (BIOTENE) LIQD 15 mLs by Mouth Rinse route at bedtime.    Yes [provider]  aspirin 325 MG EC tablet Take 325 mg by mouth daily.    Yes [provider]  cholecalciferol (VITAMIN D) 1000 UNITS tablet Take 1,000 Units by mouth daily.    Yes [provider]  cyclobenzaprine (FLEXERIL) 5 MG tablet Take 1 tablet (5 mg total) by mouth 3 (three) times daily as needed for muscle spasms. 09/04/17  Yes Aundra Dubin, PA-C  diclofenac sodium (VOLTAREN) 1 % GEL Apply 1 application topically 2 (two) times daily as needed (pain).   Yes [provider]  escitalopram (LEXAPRO) 20 MG tablet Take 1 tablet (20 mg total) by mouth 2 (two) times daily. Patient taking differently: Take 20 mg by mouth daily.  06/02/17 06/02/18 Yes Cloria Spring, MD  ezetimibe (ZETIA) 10 MG tablet TAKE 1 TABLET BY MOUTH AT  BEDTIME 02/20/17  Yes Ria Bush, MD  famotidine (PEPCID) 20 MG tablet TAKE 1 TABLET BY MOUTH TWO  TIMES DAILY 04/30/17  Yes Ria Bush, MD  furosemide (LASIX) 20 MG tablet TAKE 1 TABLET BY MOUTH  DAILY AS NEEDED FOR EDEMA 08/08/17  Yes Ria Bush, MD  gabapentin (NEURONTIN) 300 MG capsule TAKE 2 CAPSULES EVERY  MORNING, 1 CAPSULE EVERY  AFTERNOON AND 2 CAPSULES AT BEDTIME. 05/02/17  Yes Ria Bush, MD  hydrochlorothiazide (MICROZIDE) 12.5 MG capsule TAKE 1 CAPSULE BY MOUTH  DAILY 02/10/17  Yes Ria Bush, MD  lovastatin (MEVACOR) 10 MG tablet Take 1 tablet (10 mg total) by mouth every Monday, Wednesday, and Friday. 03/12/17  Yes Ria Bush, MD  metoprolol succinate (TOPROL-XL) 25 MG 24 hr tablet Take 3 tablets (75 mg total) by mouth 2 (two)  times daily. Take with or immediately following a meal 05/22/17  Yes Crenshaw, [name], MD  Polyethyl Glycol-Propyl Glycol (SYSTANE) 0.4-0.3 % GEL Place 1 drop into both eyes 2 (two) times daily.    Yes [provider]  RESTASIS 0.05 % ophthalmic emulsion INSTILL ONE DROP IN BOTH  EYES TWO TIMES DAILY 12/02/16  Yes Ria Bush, MD  tiZANidine (ZANAFLEX) 2 MG tablet Take 1 tablet (2 mg total) by mouth 3 (three) times daily as needed for muscle spasms (sedation precautions). 07/30/17  Yes Ria Bush, MD  traMADol (ULTRAM) 50 MG tablet TAKE 1 TABLET BY MOUTH 3  TIMES A DAY 09/03/17  Yes Deveshwar, Abel Presto, MD  methylPREDNISolone (MEDROL DOSEPAK) 4 MG TBPK tablet Take as directed 09/04/17   Aundra Dubin, PA-C    Family History Family History  Problem Relation Age of Onset  . Lupus Sister        and niece  . Cancer Mother        lung (nonsmoker)  . CAD Mother        MI in her 42s  . ALS Mother   . Kidney disease Father   . Alcohol abuse Father   . Diabetes Father   . Cancer Brother        bone  . Diabetes Sister   . Stroke Sister   . Cancer Maternal Uncle        bone  . Depression Sister   . Kidney failure Other        on HD  . Diabetes Brother   . Heart attack Brother   . Stroke Maternal Grandmother     Social History Social History   Tobacco Use  . Smoking status: Never Smoker  . Smokeless tobacco: Never Used  Substance Use Topics  . Alcohol use: No    Alcohol/week: 0.0 oz  . Drug use: No     Allergies   Cymbalta [duloxetine hcl]; Statins; and Sulfa antibiotics   Review of Systems Review of Systems  Constitutional: Negative for chills and fever.  Respiratory: Negative for shortness of breath.   Cardiovascular: Negative for chest pain, palpitations and leg swelling.  Gastrointestinal: Negative for abdominal pain, blood in stool, constipation, diarrhea, nausea and vomiting.  Neurological: Positive for headaches. Negative for dizziness, facial  asymmetry, speech difficulty, weakness and numbness.  All other systems reviewed and are negative.    Physical Exam Updated Vital Signs BP (!) 161/92   Pulse 94   Temp 98.7 F (37.1 C) (Oral)   Resp 20   SpO2 98%   Physical Exam  Constitutional: She appears well-developed and well-nourished.  Non-toxic appearance. No distress.  HENT:  Head: Head is with contusion (L frontal, TTP). Head is without raccoon's eyes and without Battle's sign.  Right Ear: No drainage. No hemotympanum.  Left Ear: No drainage. No hemotympanum.  Nose: Nose normal. No rhinorrhea.  Mouth/Throat: Uvula is midline and oropharynx is clear and moist.  No otorrhea.   Eyes: Pupils are equal, round, and reactive to light. Conjunctivae and EOM are normal. Right eye exhibits no discharge. Left eye exhibits no discharge.  Neck: Normal range of motion. Neck supple. No spinous process tenderness present.  Cardiovascular: Normal rate and regular rhythm.  No murmur heard. Pulses:      Radial pulses are 2+ on the right side, and 2+ on the left side.  Pulmonary/Chest: Effort normal and breath sounds normal. No respiratory distress. She has no wheezes. She has no rhonchi. She has no rales.  Abdominal: Soft. Normal appearance. She exhibits no distension. There is no tenderness.  Musculoskeletal:  No obvious deformity, appreciable swelling, erythema, ecchymosis, or open wounds Upper extremities: Full range of motion.  Nontender. Back: Patient is diffusely tender throughout the thoracic and lumbar spine, there is no point/focal vertebral tenderness to palpation.  She is also tender in the right gluteal region. Lower extreme is: Normal range of motion.  Nontender.  Neurological:  Alert. Clear speech. CN III-XII grossly intact. Normal finger to nose bilaterally. Negative pronator drift.  Sensation grossly intact bilateral upper and lower extremities.  5 out of 5 symmetric grip strength.  5 out of 5 strength with plantar  dorsiflexion bilaterally.  Patient is able to lift both legs off the bed without difficulty.  Negative Romberg.  Gait intact.  Skin: Skin is warm and dry. No rash noted.  Psychiatric: She has a normal mood and affect. Her behavior is normal.  Nursing note and vitals reviewed.   ED Treatments / Results  Labs Results for orders placed or performed during the hospital encounter of 59/56/38  Basic metabolic panel  Result Value Ref Range   Sodium 139 135 - 145 mmol/L   Potassium 3.8 3.5 - 5.1 mmol/L   Chloride 106 101 - 111 mmol/L   CO2 23 22 - 32 mmol/L   Glucose, Bld 90 65 - 99 mg/dL   BUN 6 6 - 20 mg/dL   Creatinine, Ser 1.14 (H) 0.44 - 1.00 mg/dL   Calcium 9.5 8.9 - 10.3 mg/dL   GFR calc non Af Amer 49 (L) >60 mL/min   GFR calc Af Amer 57 (L) >60 mL/min   Anion gap 10 5 - 15  CBC  Result Value Ref Range   WBC 4.4 4.0 - 10.5 K/uL   RBC 4.80 3.87 - 5.11 MIL/uL   Hemoglobin 12.7 12.0 - 15.0 g/dL   HCT 41.3 36.0 - 46.0 %   MCV 86.0 78.0 - 100.0 fL   MCH 26.5 26.0 - 34.0 pg   MCHC 30.8 30.0 - 36.0 g/dL   RDW 14.9 11.5 - 15.5 %   Platelets 257 150 - 400 K/uL  Urinalysis, Routine w reflex microscopic  Result Value Ref Range  Color, Urine YELLOW YELLOW   APPearance HAZY (A) CLEAR   Specific Gravity, Urine 1.012 1.005 - 1.030   pH 7.0 5.0 - 8.0   Glucose, UA NEGATIVE NEGATIVE mg/dL   Hgb urine dipstick NEGATIVE NEGATIVE   Bilirubin Urine NEGATIVE NEGATIVE   Ketones, ur NEGATIVE NEGATIVE mg/dL   Protein, ur NEGATIVE NEGATIVE mg/dL   Nitrite NEGATIVE NEGATIVE   Leukocytes, UA TRACE (A) NEGATIVE   RBC / HPF 0-5 0 - 5 RBC/hpf   WBC, UA 0-5 0 - 5 WBC/hpf   Bacteria, UA MANY (A) NONE SEEN   Squamous Epithelial / LPF 21-50 0 - 5   Mucus PRESENT    Non Squamous Epithelial 0-5 (A) NONE SEEN   EKG EKG Interpretation  Date/Time:  Monday September 08 2017 10:27:07 EDT Ventricular Rate:  90 PR Interval:  194 QRS Duration: 78 QT Interval:  374 QTC Calculation: 457 R Axis:   73 Text  Interpretation:  Normal sinus rhythm No significant change since last tracing Confirmed by Virgel Manifold [phone]) on 09/08/2017 12:28:24 PM   Radiology Dg Thoracic Spine 2 View  Result Date: 09/08/2017 CLINICAL DATA:  Back pain after fall. EXAM: THORACIC SPINE 2 VIEWS COMPARISON:  CT scan of September 20, 2016. FINDINGS: There is no evidence of thoracic spine fracture. Alignment is normal. No other significant bone abnormalities are identified. IMPRESSION: No significant abnormality seen in the thoracic spine. Electronically Signed   By: Marijo Conception, M.D.   On: 09/08/2017 15:27   Dg Lumbar Spine Complete  Result Date: 09/08/2017 CLINICAL DATA:  Low back pain after fall. EXAM: LUMBAR SPINE - COMPLETE 4+ VIEW COMPARISON:  Radiograph of December 05, 2017. FINDINGS: No fracture or spondylolisthesis is noted. Disc spaces are well-maintained. Mild degenerative changes seen involving the posterior facet joints of L4-5 and L5-S1. IMPRESSION: Mild degenerative changes as described above. No acute abnormality seen in the lumbar spine. Electronically Signed   By: Marijo Conception, M.D.   On: 09/08/2017 15:29   Ct Head Wo Contrast  Result Date: 09/08/2017 CLINICAL DATA:  Fall 4 days ago EXAM: CT HEAD WITHOUT CONTRAST CT CERVICAL SPINE WITHOUT CONTRAST TECHNIQUE: Multidetector CT imaging of the head and cervical spine was performed following the standard protocol without intravenous contrast. Multiplanar CT image reconstructions of the cervical spine were also generated. COMPARISON:  10/12/2015 FINDINGS: CT HEAD FINDINGS Brain: There is no mass effect, midline shift, or acute intracranial hemorrhage. There is low-density in the periventricular white matter compatible with a chronic ischemic changes of periventricular white matter. Ventricular system and extra-axial space are mildly prominent compatible with atrophy appropriate to age. Vascular: No hyperdense vessel or unexpected calcification. Skull: Cranium is  intact. Sinuses/Orbits: Mastoid air cells are clear. Visualized paranasal sinuses are clear. Orbits are within normal limits. Other: Noncontributory CT CERVICAL SPINE FINDINGS Alignment: Anatomic Skull base and vertebrae: There is no vertebral compression deformity. There is no acute fracture or dislocation. No destructive bone lesion. Soft tissues and spinal canal: There is no obvious spinal hematoma. No obvious soft tissue hematoma in the neck. 1.0 cm and 1.4 cm left thyroid hypodensities are noted. Disc levels:  Scattered mild degenerative disc disease. Upper chest: Negative. Other: Noncontributory IMPRESSION: No acute intracranial pathology. No acute cervical spine injury. Left thyroid hypodensities are noted. Thyroid ultrasound is recommended. Electronically Signed   By: Marybelle Killings M.D.   On: 09/08/2017 15:42   Ct Cervical Spine Wo Contrast  Result Date: 09/08/2017 CLINICAL DATA:  Fall 4 days  ago EXAM: CT HEAD WITHOUT CONTRAST CT CERVICAL SPINE WITHOUT CONTRAST TECHNIQUE: Multidetector CT imaging of the head and cervical spine was performed following the standard protocol without intravenous contrast. Multiplanar CT image reconstructions of the cervical spine were also generated. COMPARISON:  10/12/2015 FINDINGS: CT HEAD FINDINGS Brain: There is no mass effect, midline shift, or acute intracranial hemorrhage. There is low-density in the periventricular white matter compatible with a chronic ischemic changes of periventricular white matter. Ventricular system and extra-axial space are mildly prominent compatible with atrophy appropriate to age. Vascular: No hyperdense vessel or unexpected calcification. Skull: Cranium is intact. Sinuses/Orbits: Mastoid air cells are clear. Visualized paranasal sinuses are clear. Orbits are within normal limits. Other: Noncontributory CT CERVICAL SPINE FINDINGS Alignment: Anatomic Skull base and vertebrae: There is no vertebral compression deformity. There is no acute  fracture or dislocation. No destructive bone lesion. Soft tissues and spinal canal: There is no obvious spinal hematoma. No obvious soft tissue hematoma in the neck. 1.0 cm and 1.4 cm left thyroid hypodensities are noted. Disc levels:  Scattered mild degenerative disc disease. Upper chest: Negative. Other: Noncontributory IMPRESSION: No acute intracranial pathology. No acute cervical spine injury. Left thyroid hypodensities are noted. Thyroid ultrasound is recommended. Electronically Signed   By: Marybelle Killings M.D.   On: 09/08/2017 15:42    Procedures Procedures (including critical care time)  Medications Ordered in ED Medications - No data to display   Prior Studies Reviewed:   TEE: 06/05/17 Study Conclusions  - Left ventricle: The cavity size was normal. There was mild focal   basal hypertrophy of the septum. Systolic function was vigorous.   The estimated ejection fraction was in the range of 65% to 70%.   Wall motion was normal; there were no regional wall motion   abnormalities. Features are consistent with a pseudonormal left   ventricular filling pattern, with concomitant abnormal relaxation   and increased filling pressure (grade 2 diastolic dysfunction). - Aortic valve: Trileaflet; mildly thickened, mildly calcified   leaflets. There was very mild stenosis. There was mild   regurgitation. Peak velocity (S): 226 cm/s. Mean gradient (S): 12   mm Hg. - Mitral valve: Mildly thickened, mildly calcified leaflets . The   findings are consistent with moderate stenosis. There was mild   regurgitation. Mean gradient (D): 10 mm Hg. - Left atrium: The atrium was moderately dilated. - Tricuspid valve: There was trivial regurgitation. - Pulmonary arteries: Systolic pressure was mildly increased. PA   peak pressure: 40 mm Hg (S).   Initial Impression / Assessment and Plan / ED Course  I have reviewed the triage vital signs and the nursing notes.  Pertinent labs & imaging results that  were available during my care of the patient were reviewed by me and considered in my medical decision making (see chart for details).   Patient presents to the ED s/p two falls last week with headache. Patient nontoxic appearing, in no apparent distress, blood pressure noted to be elevated- doubt HTN emergency, patient aware of need for recheck.  Patient's description of events does not appear consistent with a true syncopal episode leading to the fall.  She does report loss of consciousness after head injury.  Patient with small contusion to the left frontal region which is tender to palpation.  She also has diffuse thoracic and lumbar tenderness, no point/focal vertebral tenderness.  Patient has no focal neurologic deficits on exam and otherwise appears well. Steady gait. Triage work-up reviewed: No anemia.  No leukocytosis.  No significant electrolyte abnormalities.  Creatinine 1.14 appears consistent with baseline.  UA with bacteriuria, contaminated, no urinary sxs, doubt UTI. EKG without obvious ischemia, no significant change from previous. Imaging obtained and negative for acute intracranial abnormality, fractures, or dislocations. Incidental findings of left thyroid hypodensities- [place] recommended- patient may have this done by PCP. Patient appears hemodynamically stable without evidence of serious head/neck/back injury related to falls, she ambulates with steady gait in the ER. Falls do not sound syncopal in nature given hx provided to me, additionally patient's work-up has been reassuring. Will discharge home with recommendations for tylenol PRN for pain with close PCP follow up. I discussed results (including need for thyroid US), treatment plan, need for PCP follow-up, and return precautions with the patient. Provided opportunity for questions, patient confirmed understanding and is in agreement with plan.   Findings and plan of care discussed with supervising physician Dr. Wilson Singer who is in agreement  with plan.   Final Clinical Impressions(s) / ED Diagnoses   Final diagnoses:  Fall, initial encounter    ED Discharge Orders    None       Leafy Kindle 09/08/17 2110    Virgel Manifold, MD 09/10/17 1246

## 2017-09-10 ENCOUNTER — Telehealth (INDEPENDENT_AMBULATORY_CARE_PROVIDER_SITE_OTHER): Payer: Self-pay | Admitting: Orthopaedic Surgery

## 2017-09-10 NOTE — Telephone Encounter (Signed)
Cindy from [facility] called advised unable to locate patient and verify address until today. Jenny Reichmann advised (PT) will start tomorrow. The number to contact Jenny Reichmann is [phone]

## 2017-09-11 DIAGNOSIS — N183 Chronic kidney disease, stage 3 (moderate): Secondary | ICD-10-CM | POA: Diagnosis not present

## 2017-09-11 DIAGNOSIS — M170 Bilateral primary osteoarthritis of knee: Secondary | ICD-10-CM | POA: Diagnosis not present

## 2017-09-11 DIAGNOSIS — I130 Hypertensive heart and chronic kidney disease with heart failure and stage 1 through stage 4 chronic kidney disease, or unspecified chronic kidney disease: Secondary | ICD-10-CM | POA: Diagnosis not present

## 2017-09-11 DIAGNOSIS — M19012 Primary osteoarthritis, left shoulder: Secondary | ICD-10-CM | POA: Diagnosis not present

## 2017-09-11 DIAGNOSIS — G8929 Other chronic pain: Secondary | ICD-10-CM | POA: Diagnosis not present

## 2017-09-11 DIAGNOSIS — H409 Unspecified glaucoma: Secondary | ICD-10-CM | POA: Diagnosis not present

## 2017-09-11 DIAGNOSIS — M19011 Primary osteoarthritis, right shoulder: Secondary | ICD-10-CM | POA: Diagnosis not present

## 2017-09-11 DIAGNOSIS — E1122 Type 2 diabetes mellitus with diabetic chronic kidney disease: Secondary | ICD-10-CM | POA: Diagnosis not present

## 2017-09-11 DIAGNOSIS — M3500 Sicca syndrome, unspecified: Secondary | ICD-10-CM | POA: Diagnosis not present

## 2017-09-11 DIAGNOSIS — M797 Fibromyalgia: Secondary | ICD-10-CM | POA: Diagnosis not present

## 2017-09-11 DIAGNOSIS — E1142 Type 2 diabetes mellitus with diabetic polyneuropathy: Secondary | ICD-10-CM | POA: Diagnosis not present

## 2017-09-11 DIAGNOSIS — I5030 Unspecified diastolic (congestive) heart failure: Secondary | ICD-10-CM | POA: Diagnosis not present

## 2017-09-11 DIAGNOSIS — M545 Low back pain: Secondary | ICD-10-CM | POA: Diagnosis not present

## 2017-09-11 NOTE — Telephone Encounter (Signed)
FYI

## 2017-09-16 ENCOUNTER — Encounter: Payer: Self-pay | Admitting: Family Medicine

## 2017-09-16 ENCOUNTER — Ambulatory Visit (INDEPENDENT_AMBULATORY_CARE_PROVIDER_SITE_OTHER): Payer: Medicare Other | Admitting: Family Medicine

## 2017-09-16 VITALS — BP 132/82 | HR 89 | Temp 98.5°F | Ht 61.0 in | Wt 189.0 lb

## 2017-09-16 DIAGNOSIS — W19XXXA Unspecified fall, initial encounter: Secondary | ICD-10-CM

## 2017-09-16 DIAGNOSIS — E1142 Type 2 diabetes mellitus with diabetic polyneuropathy: Secondary | ICD-10-CM | POA: Diagnosis not present

## 2017-09-16 DIAGNOSIS — I130 Hypertensive heart and chronic kidney disease with heart failure and stage 1 through stage 4 chronic kidney disease, or unspecified chronic kidney disease: Secondary | ICD-10-CM | POA: Diagnosis not present

## 2017-09-16 DIAGNOSIS — G6289 Other specified polyneuropathies: Secondary | ICD-10-CM | POA: Diagnosis not present

## 2017-09-16 DIAGNOSIS — N183 Chronic kidney disease, stage 3 (moderate): Secondary | ICD-10-CM | POA: Diagnosis not present

## 2017-09-16 DIAGNOSIS — M545 Low back pain: Secondary | ICD-10-CM | POA: Diagnosis not present

## 2017-09-16 DIAGNOSIS — E1122 Type 2 diabetes mellitus with diabetic chronic kidney disease: Secondary | ICD-10-CM | POA: Diagnosis not present

## 2017-09-16 DIAGNOSIS — I5030 Unspecified diastolic (congestive) heart failure: Secondary | ICD-10-CM | POA: Diagnosis not present

## 2017-09-16 DIAGNOSIS — H409 Unspecified glaucoma: Secondary | ICD-10-CM | POA: Diagnosis not present

## 2017-09-16 DIAGNOSIS — E041 Nontoxic single thyroid nodule: Secondary | ICD-10-CM | POA: Diagnosis not present

## 2017-09-16 DIAGNOSIS — M797 Fibromyalgia: Secondary | ICD-10-CM | POA: Diagnosis not present

## 2017-09-16 DIAGNOSIS — M19012 Primary osteoarthritis, left shoulder: Secondary | ICD-10-CM | POA: Diagnosis not present

## 2017-09-16 DIAGNOSIS — M3500 Sicca syndrome, unspecified: Secondary | ICD-10-CM | POA: Diagnosis not present

## 2017-09-16 DIAGNOSIS — M19011 Primary osteoarthritis, right shoulder: Secondary | ICD-10-CM | POA: Diagnosis not present

## 2017-09-16 DIAGNOSIS — M170 Bilateral primary osteoarthritis of knee: Secondary | ICD-10-CM | POA: Diagnosis not present

## 2017-09-16 DIAGNOSIS — G8929 Other chronic pain: Secondary | ICD-10-CM | POA: Diagnosis not present

## 2017-09-16 NOTE — Assessment & Plan Note (Signed)
2 left sided, incidentally found on recent CT neck. Palpable on exam. Will refer for thyroid US.

## 2017-09-16 NOTE — Assessment & Plan Note (Addendum)
Ongoing. sjogren related vs idiopathic. Saw neurology 2016. Likely contributing to increased falls due to impaired propioception. Gabapentin is controlling neuropathic pain. Suggested minimizing TCA use (amitripytline).

## 2017-09-16 NOTE — Progress Notes (Signed)
BP 132/82 (BP Location: Right Arm, Patient Position: Sitting, Cuff Size: Large)   Pulse 89   Temp 98.5 F (36.9 C) (Oral)   Ht 5\' 1"  (1.549 m)   Wt 189 lb (85.7 kg)   SpO2 96%   BMI 35.71 kg/m    CC: ER f/u visit Subjective:    Patient ID: Alexandria Leach, female    DOB: 14-Feb-1951, 68 y.o.   MRN: [id number]  HPI: Alexandria Leach is a 67 y.o. female presenting on 09/16/2017 for Hospitalization Follow-up (Seen at [facility] ED on 09/08/17, dx fall.)   Recent ER visit for recurrent falls (2 in the past week). One fall was mechanical (tripped going up porch). Second fall unclear details. Hit head on table and injured left elbow - may have been lightheaded. Thinks may have passed out - after fall. This happened after she walked outdoors to check mail on a very hot day. Labs unrevealing. Thoracic, lumbar xrays unrevealing. Head and neck CTs were stable.   CT neck incidentally found L thyroid 1cm and 1.4cm hypodensities through [facility]. She is doing home therapy ordered while at ER.   Recently on steroid taper by ortho for R lower back pain.  Reviewed current meds with patient.   Relevant past medical, surgical, family and social history reviewed and updated as indicated. Interim medical history since our last visit reviewed. Allergies and medications reviewed and updated. Outpatient Medications Prior to Visit  Medication Sig Dispense Refill  . ALPRAZolam (XANAX) 1 MG tablet Take 1 tablet (1 mg total) by mouth 3 (three) times daily as needed for anxiety. 270 tablet 1  . amitriptyline (ELAVIL) 25 MG tablet Take 1 tablet (25 mg total) by mouth at bedtime. 90 tablet 2  . amLODipine (NORVASC) 10 MG tablet TAKE 1 TABLET BY MOUTH  DAILY 90 tablet 1  . antiseptic oral rinse (BIOTENE) LIQD 15 mLs by Mouth Rinse route at bedtime.     Marland Kitchen aspirin 325 MG EC tablet Take 325 mg by mouth daily.     . cholecalciferol (VITAMIN D) 1000 UNITS tablet Take 1,000 Units by mouth daily.     .  diclofenac sodium (VOLTAREN) 1 % GEL Apply 1 application topically 2 (two) times daily as needed (pain).    Marland Kitchen escitalopram (LEXAPRO) 20 MG tablet Take 1 tablet (20 mg total) by mouth 2 (two) times daily.    Marland Kitchen ezetimibe (ZETIA) 10 MG tablet TAKE 1 TABLET BY MOUTH AT  BEDTIME 90 tablet 2  . famotidine (PEPCID) 20 MG tablet TAKE 1 TABLET BY MOUTH TWO  TIMES DAILY 180 tablet 2  . furosemide (LASIX) 20 MG tablet TAKE 1 TABLET BY MOUTH  DAILY AS NEEDED FOR EDEMA 90 tablet 1  . gabapentin (NEURONTIN) 300 MG capsule TAKE 2 CAPSULES EVERY  MORNING, 1 CAPSULE EVERY  AFTERNOON AND 2 CAPSULES AT BEDTIME. 450 capsule 1  . hydrochlorothiazide (MICROZIDE) 12.5 MG capsule TAKE 1 CAPSULE BY MOUTH  DAILY 90 capsule 3  . lovastatin (MEVACOR) 10 MG tablet Take 1 tablet (10 mg total) by mouth every Monday, Wednesday, and Friday. 70 tablet 1  . metoprolol succinate (TOPROL-XL) 25 MG 24 hr tablet Take 3 tablets (75 mg total) by mouth 2 (two) times daily. Take with or immediately following a meal 540 tablet 3  . Polyethyl Glycol-Propyl Glycol (SYSTANE) 0.4-0.3 % GEL Place 1 drop into both eyes 2 (two) times daily.     . RESTASIS 0.05 % ophthalmic emulsion INSTILL ONE  DROP IN BOTH  EYES TWO TIMES DAILY 180 each 0  . tiZANidine (ZANAFLEX) 2 MG tablet Take 1 tablet (2 mg total) by mouth 3 (three) times daily as needed for muscle spasms (sedation precautions). 30 tablet 0  . traMADol (ULTRAM) 50 MG tablet TAKE 1 TABLET BY MOUTH 3  TIMES A DAY 90 tablet 0  . cyclobenzaprine (FLEXERIL) 5 MG tablet Take 1 tablet (5 mg total) by mouth 3 (three) times daily as needed for muscle spasms. 30 tablet 0  . escitalopram (LEXAPRO) 20 MG tablet Take 1 tablet (20 mg total) by mouth 2 (two) times daily. (Patient taking differently: Take 20 mg by mouth daily. ) 180 tablet 2  . methylPREDNISolone (MEDROL DOSEPAK) 4 MG TBPK tablet Take as directed 21 tablet 0   No facility-administered medications prior to visit.      Per HPI unless  specifically indicated in ROS section below Review of Systems     Objective:    BP 132/82 (BP Location: Right Arm, Patient Position: Sitting, Cuff Size: Large)   Pulse 89   Temp 98.5 F (36.9 C) (Oral)   Ht 5\' 1"  (1.549 m)   Wt 189 lb (85.7 kg)   SpO2 96%   BMI 35.71 kg/m   Wt Readings from Last 3 Encounters:  09/16/17 189 lb (85.7 kg)  07/30/17 185 lb 4 oz (84 kg)  06/02/17 182 lb (82.6 kg)    Physical Exam  Constitutional: She appears well-developed and well-nourished. No distress.  HENT:  Mouth/Throat: Oropharynx is clear and moist. No oropharyngeal exudate.  Neck: Thyroid mass (palpable nodule L side upper thyroid lobe) present.  Cardiovascular: Normal rate and regular rhythm.  Murmur (3/6 systolic) heard. Pulmonary/Chest: Effort normal and breath sounds normal. No respiratory distress. She has no wheezes. She has no rales.  Musculoskeletal: She exhibits no edema.  Neurological: She is alert. She has normal strength. No sensory deficit. Coordination abnormal. Gait normal.  Unsteady with romberg  Psychiatric: She has a normal mood and affect.  Nursing note and vitals reviewed.  Results for orders placed or performed during the hospital encounter of 40/98/11  Basic metabolic panel  Result Value Ref Range   Sodium 139 135 - 145 mmol/L   Potassium 3.8 3.5 - 5.1 mmol/L   Chloride 106 101 - 111 mmol/L   CO2 23 22 - 32 mmol/L   Glucose, Bld 90 65 - 99 mg/dL   BUN 6 6 - 20 mg/dL   Creatinine, Ser 1.14 (H) 0.44 - 1.00 mg/dL   Calcium 9.5 8.9 - 10.3 mg/dL   GFR calc non Af Amer 49 (L) >60 mL/min   GFR calc Af Amer 57 (L) >60 mL/min   Anion gap 10 5 - 15  CBC  Result Value Ref Range   WBC 4.4 4.0 - 10.5 K/uL   RBC 4.80 3.87 - 5.11 MIL/uL   Hemoglobin 12.7 12.0 - 15.0 g/dL   HCT 41.3 36.0 - 46.0 %   MCV 86.0 78.0 - 100.0 fL   MCH 26.5 26.0 - 34.0 pg   MCHC 30.8 30.0 - 36.0 g/dL   RDW 14.9 11.5 - 15.5 %   Platelets 257 150 - 400 K/uL  Urinalysis, Routine w reflex  microscopic  Result Value Ref Range   Color, Urine YELLOW YELLOW   APPearance HAZY (A) CLEAR   Specific Gravity, Urine 1.012 1.005 - 1.030   pH 7.0 5.0 - 8.0   Glucose, UA NEGATIVE NEGATIVE mg/dL   Hgb urine  dipstick NEGATIVE NEGATIVE   Bilirubin Urine NEGATIVE NEGATIVE   Ketones, ur NEGATIVE NEGATIVE mg/dL   Protein, ur NEGATIVE NEGATIVE mg/dL   Nitrite NEGATIVE NEGATIVE   Leukocytes, UA TRACE (A) NEGATIVE   RBC / HPF 0-5 0 - 5 RBC/hpf   WBC, UA 0-5 0 - 5 WBC/hpf   Bacteria, UA MANY (A) NONE SEEN   Squamous Epithelial / LPF 21-50 0 - 5   Mucus PRESENT    Non Squamous Epithelial 0-5 (A) NONE SEEN      Assessment & Plan:   Problem List Items Addressed This Visit    Thyroid nodule    2 left sided, incidentally found on recent CT neck. Palpable on exam. Will refer for thyroid US.       Relevant Orders   US THYROID   Sjogren's syndrome ([place])   Peripheral neuropathy    Ongoing. sjogren related vs idiopathic. Saw neurology 2016. Likely contributing to increased falls due to impaired propioception. Gabapentin is controlling neuropathic pain. Suggested minimizing TCA use (amitripytline).       Relevant Medications   escitalopram (LEXAPRO) 20 MG tablet   Fall - Primary    Recurrent. s/p unrevealing neurological eval 2016-2017, improved with PT. Anticipate peripheral neuropathy contributing. Discussed medications that may contribute.  Not consistent with seizure or syncope.           No orders of the defined types were placed in this encounter.  Orders Placed This Encounter  Procedures  . US THYROID    Standing Status:   Future    Standing Expiration Date:   11/18/2018    Order Specific Question:   Reason for Exam (SYMPTOM  OR DIAGNOSIS REQUIRED)    Answer:   L thyroid nodules on CT    Order Specific Question:   Preferred imaging location?    Answer:   [id number]    Follow up plan: Return if symptoms worsen or fail to improve.  Ria Bush, MD

## 2017-09-16 NOTE — Assessment & Plan Note (Addendum)
Recurrent. s/p unrevealing neurological eval 2016-2017, improved with PT. Anticipate peripheral neuropathy contributing. Discussed medications that may contribute.  Not consistent with seizure or syncope.

## 2017-09-16 NOTE — Patient Instructions (Addendum)
We will refer you for thyroid ultrasound.  Continue working with home health.  Consider backing off amitriptyline - use sparingly if able.  Let me know if recurrent falls.

## 2017-09-19 DIAGNOSIS — I5030 Unspecified diastolic (congestive) heart failure: Secondary | ICD-10-CM | POA: Diagnosis not present

## 2017-09-19 DIAGNOSIS — H409 Unspecified glaucoma: Secondary | ICD-10-CM | POA: Diagnosis not present

## 2017-09-19 DIAGNOSIS — M19012 Primary osteoarthritis, left shoulder: Secondary | ICD-10-CM | POA: Diagnosis not present

## 2017-09-19 DIAGNOSIS — M170 Bilateral primary osteoarthritis of knee: Secondary | ICD-10-CM | POA: Diagnosis not present

## 2017-09-19 DIAGNOSIS — E1142 Type 2 diabetes mellitus with diabetic polyneuropathy: Secondary | ICD-10-CM | POA: Diagnosis not present

## 2017-09-19 DIAGNOSIS — M3500 Sicca syndrome, unspecified: Secondary | ICD-10-CM | POA: Diagnosis not present

## 2017-09-19 DIAGNOSIS — N183 Chronic kidney disease, stage 3 (moderate): Secondary | ICD-10-CM | POA: Diagnosis not present

## 2017-09-19 DIAGNOSIS — M797 Fibromyalgia: Secondary | ICD-10-CM | POA: Diagnosis not present

## 2017-09-19 DIAGNOSIS — G8929 Other chronic pain: Secondary | ICD-10-CM | POA: Diagnosis not present

## 2017-09-19 DIAGNOSIS — E1122 Type 2 diabetes mellitus with diabetic chronic kidney disease: Secondary | ICD-10-CM | POA: Diagnosis not present

## 2017-09-19 DIAGNOSIS — I130 Hypertensive heart and chronic kidney disease with heart failure and stage 1 through stage 4 chronic kidney disease, or unspecified chronic kidney disease: Secondary | ICD-10-CM | POA: Diagnosis not present

## 2017-09-19 DIAGNOSIS — M19011 Primary osteoarthritis, right shoulder: Secondary | ICD-10-CM | POA: Diagnosis not present

## 2017-09-19 DIAGNOSIS — M545 Low back pain: Secondary | ICD-10-CM | POA: Diagnosis not present

## 2017-09-22 DIAGNOSIS — M19011 Primary osteoarthritis, right shoulder: Secondary | ICD-10-CM | POA: Diagnosis not present

## 2017-09-22 DIAGNOSIS — M797 Fibromyalgia: Secondary | ICD-10-CM | POA: Diagnosis not present

## 2017-09-22 DIAGNOSIS — N183 Chronic kidney disease, stage 3 (moderate): Secondary | ICD-10-CM | POA: Diagnosis not present

## 2017-09-22 DIAGNOSIS — G8929 Other chronic pain: Secondary | ICD-10-CM | POA: Diagnosis not present

## 2017-09-22 DIAGNOSIS — H409 Unspecified glaucoma: Secondary | ICD-10-CM | POA: Diagnosis not present

## 2017-09-22 DIAGNOSIS — M3500 Sicca syndrome, unspecified: Secondary | ICD-10-CM | POA: Diagnosis not present

## 2017-09-22 DIAGNOSIS — E1142 Type 2 diabetes mellitus with diabetic polyneuropathy: Secondary | ICD-10-CM | POA: Diagnosis not present

## 2017-09-22 DIAGNOSIS — M170 Bilateral primary osteoarthritis of knee: Secondary | ICD-10-CM | POA: Diagnosis not present

## 2017-09-22 DIAGNOSIS — I5030 Unspecified diastolic (congestive) heart failure: Secondary | ICD-10-CM | POA: Diagnosis not present

## 2017-09-22 DIAGNOSIS — M19012 Primary osteoarthritis, left shoulder: Secondary | ICD-10-CM | POA: Diagnosis not present

## 2017-09-22 DIAGNOSIS — M545 Low back pain: Secondary | ICD-10-CM | POA: Diagnosis not present

## 2017-09-22 DIAGNOSIS — I130 Hypertensive heart and chronic kidney disease with heart failure and stage 1 through stage 4 chronic kidney disease, or unspecified chronic kidney disease: Secondary | ICD-10-CM | POA: Diagnosis not present

## 2017-09-22 DIAGNOSIS — E1122 Type 2 diabetes mellitus with diabetic chronic kidney disease: Secondary | ICD-10-CM | POA: Diagnosis not present

## 2017-09-24 DIAGNOSIS — M19012 Primary osteoarthritis, left shoulder: Secondary | ICD-10-CM | POA: Diagnosis not present

## 2017-09-24 DIAGNOSIS — H409 Unspecified glaucoma: Secondary | ICD-10-CM | POA: Diagnosis not present

## 2017-09-24 DIAGNOSIS — G8929 Other chronic pain: Secondary | ICD-10-CM | POA: Diagnosis not present

## 2017-09-24 DIAGNOSIS — E1142 Type 2 diabetes mellitus with diabetic polyneuropathy: Secondary | ICD-10-CM | POA: Diagnosis not present

## 2017-09-24 DIAGNOSIS — M545 Low back pain: Secondary | ICD-10-CM | POA: Diagnosis not present

## 2017-09-24 DIAGNOSIS — M3500 Sicca syndrome, unspecified: Secondary | ICD-10-CM | POA: Diagnosis not present

## 2017-09-24 DIAGNOSIS — I5030 Unspecified diastolic (congestive) heart failure: Secondary | ICD-10-CM | POA: Diagnosis not present

## 2017-09-24 DIAGNOSIS — M19011 Primary osteoarthritis, right shoulder: Secondary | ICD-10-CM | POA: Diagnosis not present

## 2017-09-24 DIAGNOSIS — E1122 Type 2 diabetes mellitus with diabetic chronic kidney disease: Secondary | ICD-10-CM | POA: Diagnosis not present

## 2017-09-24 DIAGNOSIS — N183 Chronic kidney disease, stage 3 (moderate): Secondary | ICD-10-CM | POA: Diagnosis not present

## 2017-09-24 DIAGNOSIS — M797 Fibromyalgia: Secondary | ICD-10-CM | POA: Diagnosis not present

## 2017-09-24 DIAGNOSIS — I130 Hypertensive heart and chronic kidney disease with heart failure and stage 1 through stage 4 chronic kidney disease, or unspecified chronic kidney disease: Secondary | ICD-10-CM | POA: Diagnosis not present

## 2017-09-24 DIAGNOSIS — M170 Bilateral primary osteoarthritis of knee: Secondary | ICD-10-CM | POA: Diagnosis not present

## 2017-09-25 ENCOUNTER — Encounter (HOSPITAL_COMMUNITY): Payer: Self-pay | Admitting: Psychiatry

## 2017-09-25 ENCOUNTER — Ambulatory Visit (INDEPENDENT_AMBULATORY_CARE_PROVIDER_SITE_OTHER): Payer: Medicare Other | Admitting: Psychiatry

## 2017-09-25 ENCOUNTER — Ambulatory Visit
Admission: RE | Admit: 2017-09-25 | Discharge: 2017-09-25 | Disposition: A | Discharge status: Home / Self Care | Payer: Medicare Other | Source: Ambulatory Visit | Attending: Family Medicine | Admitting: Family Medicine

## 2017-09-25 VITALS — BP 140/82 | HR 82 | Ht 61.0 in | Wt 187.0 lb

## 2017-09-25 DIAGNOSIS — F331 Major depressive disorder, recurrent, moderate: Secondary | ICD-10-CM

## 2017-09-25 DIAGNOSIS — E041 Nontoxic single thyroid nodule: Secondary | ICD-10-CM | POA: Diagnosis not present

## 2017-09-25 MED ORDER — ALPRAZOLAM 1 MG PO TABS: 1.0000 mg | ORAL_TABLET | Freq: Three times a day (TID) | ORAL | 1 refills | Status: DC | PRN

## 2017-09-25 MED ORDER — AMITRIPTYLINE HCL 25 MG PO TABS: 25.0000 mg | ORAL_TABLET | Freq: Every day | ORAL | 2 refills | Status: DC

## 2017-09-25 MED ORDER — ESCITALOPRAM OXALATE 20 MG PO TABS: 20.0000 mg | ORAL_TABLET | Freq: Two times a day (BID) | ORAL | 2 refills | Status: DC
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
## 2017-09-30 DIAGNOSIS — G8929 Other chronic pain: Secondary | ICD-10-CM | POA: Diagnosis not present

## 2017-09-30 DIAGNOSIS — I5030 Unspecified diastolic (congestive) heart failure: Secondary | ICD-10-CM | POA: Diagnosis not present

## 2017-09-30 DIAGNOSIS — N183 Chronic kidney disease, stage 3 (moderate): Secondary | ICD-10-CM | POA: Diagnosis not present

## 2017-09-30 DIAGNOSIS — M170 Bilateral primary osteoarthritis of knee: Secondary | ICD-10-CM | POA: Diagnosis not present

## 2017-09-30 DIAGNOSIS — H409 Unspecified glaucoma: Secondary | ICD-10-CM | POA: Diagnosis not present

## 2017-09-30 DIAGNOSIS — E1122 Type 2 diabetes mellitus with diabetic chronic kidney disease: Secondary | ICD-10-CM | POA: Diagnosis not present

## 2017-09-30 DIAGNOSIS — E1142 Type 2 diabetes mellitus with diabetic polyneuropathy: Secondary | ICD-10-CM | POA: Diagnosis not present

## 2017-09-30 DIAGNOSIS — M19012 Primary osteoarthritis, left shoulder: Secondary | ICD-10-CM | POA: Diagnosis not present

## 2017-09-30 DIAGNOSIS — I130 Hypertensive heart and chronic kidney disease with heart failure and stage 1 through stage 4 chronic kidney disease, or unspecified chronic kidney disease: Secondary | ICD-10-CM | POA: Diagnosis not present

## 2017-09-30 DIAGNOSIS — M3500 Sicca syndrome, unspecified: Secondary | ICD-10-CM | POA: Diagnosis not present

## 2017-09-30 DIAGNOSIS — M545 Low back pain: Secondary | ICD-10-CM | POA: Diagnosis not present

## 2017-09-30 DIAGNOSIS — M797 Fibromyalgia: Secondary | ICD-10-CM | POA: Diagnosis not present

## 2017-09-30 DIAGNOSIS — M19011 Primary osteoarthritis, right shoulder: Secondary | ICD-10-CM | POA: Diagnosis not present

## 2017-10-02 ENCOUNTER — Telehealth (INDEPENDENT_AMBULATORY_CARE_PROVIDER_SITE_OTHER): Payer: Self-pay | Admitting: Radiology

## 2017-10-02 NOTE — Telephone Encounter (Signed)
Tramadol 50 mg   Request Reference Number: [id number]. TRAMADOL HCL TAB 50MG  is approved through 03/17/2018. For further questions, call [phone].

## 2017-10-03 DIAGNOSIS — I5030 Unspecified diastolic (congestive) heart failure: Secondary | ICD-10-CM | POA: Diagnosis not present

## 2017-10-03 DIAGNOSIS — M545 Low back pain: Secondary | ICD-10-CM | POA: Diagnosis not present

## 2017-10-03 DIAGNOSIS — E1142 Type 2 diabetes mellitus with diabetic polyneuropathy: Secondary | ICD-10-CM | POA: Diagnosis not present

## 2017-10-03 DIAGNOSIS — N183 Chronic kidney disease, stage 3 (moderate): Secondary | ICD-10-CM | POA: Diagnosis not present

## 2017-10-03 DIAGNOSIS — M170 Bilateral primary osteoarthritis of knee: Secondary | ICD-10-CM | POA: Diagnosis not present

## 2017-10-03 DIAGNOSIS — E1122 Type 2 diabetes mellitus with diabetic chronic kidney disease: Secondary | ICD-10-CM | POA: Diagnosis not present

## 2017-10-03 DIAGNOSIS — I130 Hypertensive heart and chronic kidney disease with heart failure and stage 1 through stage 4 chronic kidney disease, or unspecified chronic kidney disease: Secondary | ICD-10-CM | POA: Diagnosis not present

## 2017-10-03 DIAGNOSIS — M19011 Primary osteoarthritis, right shoulder: Secondary | ICD-10-CM | POA: Diagnosis not present

## 2017-10-03 DIAGNOSIS — H409 Unspecified glaucoma: Secondary | ICD-10-CM | POA: Diagnosis not present

## 2017-10-03 DIAGNOSIS — M797 Fibromyalgia: Secondary | ICD-10-CM | POA: Diagnosis not present

## 2017-10-03 DIAGNOSIS — M3500 Sicca syndrome, unspecified: Secondary | ICD-10-CM | POA: Diagnosis not present

## 2017-10-03 DIAGNOSIS — G8929 Other chronic pain: Secondary | ICD-10-CM | POA: Diagnosis not present

## 2017-10-03 DIAGNOSIS — M19012 Primary osteoarthritis, left shoulder: Secondary | ICD-10-CM | POA: Diagnosis not present

## 2017-10-09 DIAGNOSIS — R05 Cough: Secondary | ICD-10-CM | POA: Diagnosis not present

## 2017-10-09 DIAGNOSIS — J209 Acute bronchitis, unspecified: Secondary | ICD-10-CM | POA: Diagnosis not present

## 2017-11-02 ENCOUNTER — Emergency Department (HOSPITAL_COMMUNITY)
Admission: EM | Admit: 2017-11-02 | Discharge: 2017-11-02 | Disposition: A | Discharge status: Home / Self Care | Payer: Medicare Other | Attending: Emergency Medicine | Admitting: Emergency Medicine

## 2017-11-02 ENCOUNTER — Emergency Department (HOSPITAL_COMMUNITY): Payer: Medicare Other

## 2017-11-02 ENCOUNTER — Encounter (HOSPITAL_COMMUNITY): Payer: Self-pay | Admitting: Emergency Medicine

## 2017-11-02 DIAGNOSIS — R05 Cough: Secondary | ICD-10-CM | POA: Insufficient documentation

## 2017-11-02 DIAGNOSIS — R069 Unspecified abnormalities of breathing: Secondary | ICD-10-CM | POA: Diagnosis not present

## 2017-11-02 DIAGNOSIS — R109 Unspecified abdominal pain: Secondary | ICD-10-CM | POA: Diagnosis not present

## 2017-11-02 DIAGNOSIS — Z79899 Other long term (current) drug therapy: Secondary | ICD-10-CM | POA: Insufficient documentation

## 2017-11-02 DIAGNOSIS — N183 Chronic kidney disease, stage 3 (moderate): Secondary | ICD-10-CM | POA: Insufficient documentation

## 2017-11-02 DIAGNOSIS — I10 Essential (primary) hypertension: Secondary | ICD-10-CM | POA: Diagnosis not present

## 2017-11-02 DIAGNOSIS — I130 Hypertensive heart and chronic kidney disease with heart failure and stage 1 through stage 4 chronic kidney disease, or unspecified chronic kidney disease: Secondary | ICD-10-CM | POA: Insufficient documentation

## 2017-11-02 DIAGNOSIS — I509 Heart failure, unspecified: Secondary | ICD-10-CM | POA: Diagnosis not present

## 2017-11-02 DIAGNOSIS — Z7982 Long term (current) use of aspirin: Secondary | ICD-10-CM | POA: Diagnosis not present

## 2017-11-02 DIAGNOSIS — Z952 Presence of prosthetic heart valve: Secondary | ICD-10-CM | POA: Insufficient documentation

## 2017-11-02 DIAGNOSIS — R0602 Shortness of breath: Secondary | ICD-10-CM | POA: Insufficient documentation

## 2017-11-02 DIAGNOSIS — R059 Cough, unspecified: Secondary | ICD-10-CM

## 2017-11-02 DIAGNOSIS — R52 Pain, unspecified: Secondary | ICD-10-CM | POA: Diagnosis not present

## 2017-11-02 LAB — COMPREHENSIVE METABOLIC PANEL
ALT: 11 U/L (ref 0–44)
AST: 24 U/L (ref 15–41)
Albumin: 3.5 g/dL (ref 3.5–5.0)
Alkaline Phosphatase: 66 U/L (ref 38–126)
Anion gap: 7 (ref 5–15)
BUN: 6 mg/dL — AB (ref 8–23)
CHLORIDE: 113 mmol/L — AB (ref 98–111)
CO2: 23 mmol/L (ref 22–32)
CREATININE: 1.14 mg/dL — AB (ref 0.44–1.00)
Calcium: 9 mg/dL (ref 8.9–10.3)
GFR calc Af Amer: 57 mL/min — ABNORMAL LOW (ref >60)
GFR, EST NON AFRICAN AMERICAN: 49 mL/min — AB (ref >60)
Glucose, Bld: 93 mg/dL (ref 70–99)
Potassium: 3.9 mmol/L (ref 3.5–5.1)
SODIUM: 143 mmol/L (ref 135–145)
Total Bilirubin: 0.6 mg/dL (ref 0.3–1.2)
Total Protein: 7.1 g/dL (ref 6.5–8.1)

## 2017-11-02 LAB — CBC WITH DIFFERENTIAL/PLATELET
Abs Immature Granulocytes: 0 10*3/uL (ref 0.0–0.1)
Basophils Absolute: 0 10*3/uL (ref 0.0–0.1)
Basophils Relative: 1 %
EOS ABS: 0.2 10*3/uL (ref 0.0–0.7)
EOS PCT: 4 %
HEMATOCRIT: 39.2 % (ref 36.0–46.0)
Hemoglobin: 12 g/dL (ref 12.0–15.0)
Immature Granulocytes: 0 %
LYMPHS ABS: 1 10*3/uL (ref 0.7–4.0)
Lymphocytes Relative: 18 %
MCH: 26.7 pg (ref 26.0–34.0)
MCHC: 30.6 g/dL (ref 30.0–36.0)
MCV: 87.1 fL (ref 78.0–100.0)
MONO ABS: 0.7 10*3/uL (ref 0.1–1.0)
MONOS PCT: 12 %
Neutro Abs: 3.8 10*3/uL (ref 1.7–7.7)
Neutrophils Relative %: 65 %
Platelets: 205 10*3/uL (ref 150–400)
RBC: 4.5 MIL/uL (ref 3.87–5.11)
RDW: 15.5 % (ref 11.5–15.5)
WBC: 5.8 10*3/uL (ref 4.0–10.5)

## 2017-11-02 LAB — BRAIN NATRIURETIC PEPTIDE: B Natriuretic Peptide: 166 pg/mL — ABNORMAL HIGH (ref 0.0–100.0)

## 2017-11-02 LAB — I-STAT TROPONIN, ED: Troponin i, poc: 0 ng/mL (ref 0.00–0.08)

## 2017-11-02 MED ORDER — PREDNISONE 10 MG PO TABS: 20.0000 mg | ORAL_TABLET | Freq: Two times a day (BID) | ORAL | 0 refills | Status: DC

## 2017-11-02 MED ORDER — IPRATROPIUM-ALBUTEROL 0.5-2.5 (3) MG/3ML IN SOLN
3.0000 mL | Freq: Once | RESPIRATORY_TRACT | Status: AC
  Administered 2017-11-02: 3 mL via RESPIRATORY_TRACT
  Filled 2017-11-02: qty 3

## 2017-11-02 MED ORDER — ALBUTEROL SULFATE HFA 108 (90 BASE) MCG/ACT IN AERS: 2.0000 | INHALATION_SPRAY | RESPIRATORY_TRACT | 0 refills | Status: DC | PRN

## 2017-11-02 NOTE — ED Notes (Signed)
Patient ambulated, O2 sat on room air remained 98-99%.

## 2017-11-02 NOTE — ED Triage Notes (Signed)
Pt arrives via [facility], diagnosed with bronchitis 2 weeks ago, finished abx she was given at urgent care, states productive cough with green sputum still present, also c/o left rib pain with coughing. resp e/u, nad. No fevers or chills, also endorses some sinus congestion.

## 2017-11-02 NOTE — ED Provider Notes (Signed)
[facility] EMERGENCY DEPARTMENT Provider Note   CSN: [id number] Arrival date & time: 11/02/17  0756     History   Chief Complaint Chief Complaint  Patient presents with  . Cough    HPI Alexandria Leach is a 67 y.o. female.  HPI   Alexandria Leach is a 67 y.o. female, with a history of CHF, CKD, DVT, PE, and HTN, presenting to the ED with productive cough for about three weeks. Accompanied by nasal congestion and intermittent shortness of breath.  Was seen at urgent care about two weeks ago, diagnosed with bronchitis, and prescribed azithromycin and cough medicine. She states symptoms seemed to improved and then returned about a week ago. Does not feel anything like her previous PE.  Denies fever/chills, N/V/D, abdominal pain, chest pain, orthopnea, peripheral edema or pain, or any other complaints.     Past Medical History:  Diagnosis Date  . Ankle fracture, left 02/19/2015   from a fall  . Anxiety   . CHF (congestive heart failure) ([place])   . CKD (chronic kidney disease) stage 3, GFR 30-59 ml/min [facility])    saw nephrologist Dr Harden Mo  . DDD (degenerative disc disease), cervical   . Depression   . Fibromyalgia   . Glaucoma    s/p surgery, sees ophtho Q6 mo  . 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 History of kidney stones 2010  . History of pulmonary embolism 2001, 2006   completed coumadin courses  . History of rheumatic fever x3  . HLD (hyperlipidemia)   . HTN (hypertension)   . Insomnia   . Lung nodule 09/22/2012   RLL - 73mm, stable since 2014. Thought benign.   . Osteoarthritis    shoulders and knees, not RA per Dr Estanislado Pandy, positive ANA, positive Ro  . Osteopenia 09/18/2015   DEXA T -1.1 hip, -0.2 spine 08/2015   . Personal history of urinary calculi latest 2014  . Pneumonia 12/02/2011  . PONV (postoperative nausea and vomiting)   . Refusal of blood transfusions as patient is  Jehovah's Witness   . Rheumatic heart disease 1980   s/p mitral valve repair 1980  . Sjogren's syndrome [facility])     Patient Active Problem List   Diagnosis Date Noted  . Thyroid nodule 09/16/2017  . Spasm of muscle of lower back 07/30/2017  . Anterior neck pain 05/14/2017  . Sialoadenitis of submandibular gland 10/29/2016  . High risk medication use 06/05/2016  . Peripheral neuropathy 01/30/2016  . DNR no code (do not resuscitate) 12/08/2015  . 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 TIA (transient ischemic attack) 10/12/2015  . Abnormal urinalysis 10/12/2015  . Mild mitral regurgitation 10/12/2015  . Osteopenia 09/18/2015  . Right arm pain 08/29/2015  . Fall   . Trimalleolar fracture of left ankle 02/23/2015  . Right sided sciatica 02/21/2015  . Imbalance 01/12/2015  . Transaminitis 12/24/2014  . DDD (degenerative disc disease), cervical   . Health maintenance examination 10/18/2014  . Advanced care planning/counseling discussion 10/18/2014  . Medicare annual wellness visit, initial 10/18/2014  . Refusal of blood transfusions as patient is Jehovah's Witness   . Sjogren's syndrome ([place])   . CKD (chronic kidney disease) stage 3, GFR 30-59 ml/min (HCC)   . Glaucoma   . Fibromyalgia   . Osteoarthritis   . History of pulmonary embolism   . Lung nodule 09/22/2012  . Diastolic CHF ([place]) 31/54/0086  .  Aortic stenosis 04/22/2011  . Palpitations 08/10/2010  . Dyspnea 08/10/2010  . HOT FLASHES 06/28/2008  . Trochanteric bursitis of right hip 06/28/2008  . ARTHRALGIA 07/01/2007  . BACK PAIN, LEFT 08/25/2006  . GAD (generalized anxiety disorder) 03/28/2006  . MITRAL VALVE REPLACEMENT, HX OF 03/28/2006  . TAH/BSO, HX OF 03/28/2006  . HYPERCHOLESTEROLEMIA 12/31/2005  . MDD (major depressive disorder), recurrent episode ([place]) 12/31/2005  . RHEUMATIC HEART DISEASE 12/31/2005  . Essential hypertension 12/31/2005  . Chronic insomnia 12/31/2005    Past Surgical  History:  Procedure Laterality Date  . BREAST BIOPSY Right 2006   benign  . CHOLECYSTECTOMY  11/27/2011   Procedure: LAPAROSCOPIC CHOLECYSTECTOMY WITH INTRAOPERATIVE CHOLANGIOGRAM;  Surgeon: Adin Hector, MD;  Location: [facility];  Service: General;  Laterality: N/A;  laparoscopic cholecystectomy with choleangiogram umbilical hernia repair  . COLONOSCOPY  07/2014   [facility]enty)  . dexa  08/2012   normal per patient - no records available  . MITRAL VALVE REPAIR  1980   open heart  . ORIF ANKLE FRACTURE Left 02/26/2015   Procedure: OPEN REDUCTION INTERNAL FIXATION (ORIF) LEFT TRIMALLEOLAR ANKLE FRACTURE;  Surgeon: [name], MD;  Location: [facility];  Service: Orthopedics;  Laterality: Left;  . TUBAL LIGATION  1980  . UMBILICAL HERNIA REPAIR  11/27/2011   Procedure: HERNIA REPAIR UMBILICAL ADULT;  Surgeon: Adin Hector, MD;  Location: [facility];  Service: General;  Laterality: N/A;  . [place]   for fibroids -- partial, ovaries remain     OB History   None      Home Medications    Prior to Admission medications   Medication Sig Start Date End Date Taking? Authorizing Provider  albuterol (PROVENTIL HFA;VENTOLIN HFA) 108 (90 Base) MCG/ACT inhaler Inhale 2 puffs into the lungs every 4 (four) hours as needed for wheezing or shortness of breath. 11/02/17   Joy, Shawn C, PA-C  ALPRAZolam (XANAX) 1 MG tablet Take 1 tablet (1 mg total) by mouth 3 (three) times daily as needed for anxiety. 09/25/17 09/25/18  Cloria Spring, MD  amitriptyline (ELAVIL) 25 MG tablet Take 1 tablet (25 mg total) by mouth at bedtime. 09/25/17   Cloria Spring, MD  amLODipine (NORVASC) 10 MG tablet TAKE 1 TABLET BY MOUTH  DAILY 01/20/17   Ria Bush, MD  antiseptic oral rinse (BIOTENE) LIQD 15 mLs by Mouth Rinse route at bedtime.     [provider]  aspirin 325 MG EC tablet Take 325 mg by mouth daily.     [provider]  cholecalciferol (VITAMIN D) 1000 UNITS tablet Take 1,000  Units by mouth daily.     [provider]  diclofenac sodium (VOLTAREN) 1 % GEL Apply 1 application topically 2 (two) times daily as needed (pain).    [provider]  escitalopram (LEXAPRO) 20 MG tablet Take 1 tablet (20 mg total) by mouth 2 (two) times daily. 09/25/17   Cloria Spring, MD  ezetimibe (ZETIA) 10 MG tablet TAKE 1 TABLET BY MOUTH AT  BEDTIME 02/20/17   Ria Bush, MD  famotidine (PEPCID) 20 MG tablet TAKE 1 TABLET BY MOUTH TWO  TIMES DAILY 04/30/17   Ria Bush, MD  furosemide (LASIX) 20 MG tablet TAKE 1 TABLET BY MOUTH  DAILY AS NEEDED FOR EDEMA 08/08/17   Ria Bush, MD  gabapentin (NEURONTIN) 300 MG capsule TAKE 2 CAPSULES EVERY  MORNING, 1 CAPSULE EVERY  AFTERNOON AND 2 CAPSULES AT BEDTIME. 05/02/17  Ria Bush, MD  hydrochlorothiazide (MICROZIDE) 12.5 MG capsule TAKE 1 CAPSULE BY MOUTH  DAILY 02/10/17   Ria Bush, MD  lovastatin (MEVACOR) 10 MG tablet Take 1 tablet (10 mg total) by mouth every Monday, Wednesday, and Friday. 03/12/17   Ria Bush, MD  metoprolol succinate (TOPROL-XL) 25 MG 24 hr tablet Take 3 tablets (75 mg total) by mouth 2 (two) times daily. Take with or immediately following a meal 05/22/17   Lelon Perla, MD  Polyethyl Glycol-Propyl Glycol (SYSTANE) 0.4-0.3 % GEL Place 1 drop into both eyes 2 (two) times daily.     [provider]  predniSONE (DELTASONE) 10 MG tablet Take 2 tablets (20 mg total) by mouth 2 (two) times daily with a meal for 3 days. 11/02/17 11/05/17  Joy, Shawn C, PA-C  RESTASIS 0.05 % ophthalmic emulsion INSTILL ONE DROP IN BOTH  EYES TWO TIMES DAILY 12/02/16   Ria Bush, MD  tiZANidine (ZANAFLEX) 2 MG tablet Take 1 tablet (2 mg total) by mouth 3 (three) times daily as needed for muscle spasms (sedation precautions). 07/30/17   Ria Bush, MD  traMADol Veatrice Bourbon) 50 MG tablet TAKE 1 TABLET BY MOUTH 3  TIMES A DAY 09/03/17   Bo Merino, MD    Family  History Family History  Problem Relation Age of Onset  . Lupus Sister        and niece  . Cancer Mother        lung (nonsmoker)  . CAD Mother        MI in her 56s  . ALS Mother   . Kidney disease Father   . Alcohol abuse Father   . Diabetes Father   . Cancer Brother        bone  . Diabetes Sister   . Stroke Sister   . Cancer Maternal Uncle        bone  . Depression Sister   . Kidney failure Other        on HD  . Diabetes Brother   . Heart attack Brother   . Stroke Maternal Grandmother     Social History Social History   Tobacco Use  . Smoking status: Never Smoker  . Smokeless tobacco: Never Used  Substance Use Topics  . Alcohol use: No    Alcohol/week: 0.0 standard drinks  . Drug use: No     Allergies   Cymbalta [duloxetine hcl]; Statins; and Sulfa antibiotics   Review of Systems Review of Systems  Constitutional: Negative for chills and fever.  HENT: Positive for congestion.   Respiratory: Positive for cough and shortness of breath.   Cardiovascular: Negative for chest pain and leg swelling.  Gastrointestinal: Negative for abdominal pain, diarrhea, nausea and vomiting.  Neurological: Negative for weakness.  All other systems reviewed and are negative.    Physical Exam Updated Vital Signs BP (!) 158/84   Pulse 90   Temp 98.1 F (36.7 C)   Resp 18   SpO2 97%   Physical Exam  Constitutional: She appears well-developed and well-nourished. No distress.  HENT:  Head: Normocephalic and atraumatic.  Eyes: Conjunctivae are normal.  Neck: Neck supple.  Cardiovascular: Normal rate, regular rhythm, normal heart sounds and intact distal pulses.  Pulmonary/Chest: Effort normal and breath sounds normal. No respiratory distress.  No increased work of breathing.  Speaks in full sentences without difficulty.  Abdominal: Soft. There is no tenderness. There is no guarding.  Musculoskeletal: She exhibits no edema.  Lymphadenopathy:  She has no cervical  adenopathy.  Neurological: She is alert.  Skin: Skin is warm and dry. She is not diaphoretic.  Psychiatric: She has a normal mood and affect. Her behavior is normal.  Nursing note and vitals reviewed.    ED Treatments / Results  Labs (all labs ordered are listed, but only abnormal results are displayed) Labs Reviewed  COMPREHENSIVE METABOLIC PANEL - Abnormal; Notable for the following components:      Result Value   Chloride 113 (*)    BUN 6 (*)    Creatinine, Ser 1.14 (*)    GFR calc non Af Amer 49 (*)    GFR calc Af Amer 57 (*)    All other components within normal limits  BRAIN NATRIURETIC PEPTIDE - Abnormal; Notable for the following components:   B Natriuretic Peptide 166.0 (*)    All other components within normal limits  CBC WITH DIFFERENTIAL/PLATELET  I-STAT TROPONIN, ED    EKG EKG Interpretation  Date/Time:  Sunday November 02 2017 08:37:45 EDT Ventricular Rate:  104 PR Interval:    QRS Duration: 77 QT Interval:  383 QTC Calculation: 504 R Axis:   84 Text Interpretation:  Sinus tachycardia Atrial premature complex Borderline right axis deviation Borderline T abnormalities, anterior leads Prolonged QT interval s1 is new Confirmed by Varney Biles [phone]) on 11/02/2017 9:32:44 AM Also confirmed by Varney Biles [phone]), editor Philomena Doheny [phone])  on 11/02/2017 10:27:24 AM   Radiology Dg Chest 2 View  Result Date: 11/02/2017 CLINICAL DATA:  Cough and shortness of breath. EXAM: CHEST - 2 VIEW COMPARISON:  Chest x-ray dated September 20, 2016. FINDINGS: Stable mild cardiomegaly. Normal mediastinal contours. Normal pulmonary vascularity. Mild peribronchial thickening. No focal consolidation, pleural effusion, or pneumothorax. No acute osseous abnormality. Prior median sternotomy. IMPRESSION: Bronchitic changes.  No consolidation. Electronically Signed   By: Titus Dubin M.D.   On: 11/02/2017 09:04    Procedures Procedures (including critical care time)  Medications  Ordered in ED Medications  ipratropium-albuterol (DUONEB) 0.5-2.5 (3) MG/3ML nebulizer solution 3 mL (3 mLs Nebulization Given 11/02/17 0840)     Initial Impression / Assessment and Plan / ED Course  I have reviewed the triage vital signs and the nursing notes.  Pertinent labs & imaging results that were available during my care of the patient were reviewed by me and considered in my medical decision making (see chart for details).  Clinical Course as of Nov 03 1051  Sun Nov 02, 2017  1005 Patient states she feels much better.   [SJ]  1005 No rales on exam.  No pulmonary edema on chest x-ray.  Lower than patient's previous values.  No orthopnea or peripheral edema.  B Natriuretic Peptide(!): 166.0 [SJ]    Clinical Course User Index [SJ] Joy, Shawn C, PA-C    Patient presents with continued cough. Patient is nontoxic appearing, afebrile, not tachycardic, not tachypneic, not hypotensive, SPO2 of 97% on room air, and is in no apparent distress. Low suspicion for PE based on the patient's description of her present illness and vital signs are not suggestive.   Findings and plan of care discussed with Varney Biles, MD. Dr. Kathrynn Humble personally evaluated and examined this patient.   Vitals:   11/02/17 0757 11/02/17 1009 11/02/17 1030 11/02/17 1050  BP: (!) 158/84  (!) 147/76   Pulse: 90 89 92 97  Resp: 18 19 (!) 24   Temp: 98.1 F (36.7 C)     SpO2: 97% 100% 100% 98%  Final Clinical Impressions(s) / ED Diagnoses   Final diagnoses:  Cough    ED Discharge Orders         Ordered    predniSONE (DELTASONE) 10 MG tablet  2 times daily with meals     11/02/17 1006    albuterol (PROVENTIL HFA;VENTOLIN HFA) 108 (90 Base) MCG/ACT inhaler  Every 4 hours PRN     11/02/17 1006           Joy, Helane Gunther, PA-C 11/02/17 1053    Varney Biles, MD 11/02/17 1553

## 2017-11-02 NOTE — ED Notes (Signed)
Pt verbalized understanding of dc instructions, vss, ambulatory, nad.

## 2017-11-02 NOTE — Discharge Instructions (Addendum)
Your symptoms are likely consistent with a viral illness. Viruses do not require or respond to antibiotics. Treatment is symptomatic care.  Hand washing: Wash your hands throughout the day, but especially before and after touching the face, using the restroom, sneezing, coughing, or touching surfaces that have been coughed or sneezed upon. Hydration: Symptoms will be intensified and complicated by dehydration. Dehydration can also extend the duration of symptoms. Drink plenty of fluids and get plenty of rest. You should be drinking at least half a liter of water an hour to stay hydrated. Electrolyte drinks (ex. Gatorade, Powerade, Pedialyte) are also encouraged. You should be drinking enough fluids to make your urine light yellow, almost clear. If this is not the case, you are not drinking enough water. Please note that some of the treatments indicated below will not be effective if you are not adequately hydrated. Pain or fever: Acetaminophen (generic for Tylenol) for pain or fever.  Albuterol: May use the albuterol as needed for instances of shortness of breath. Prednisone: Take the prednisone, as directed, in its entirety. Zyrtec or Claritin: May add these medication daily to control underlying symptoms of congestion, sneezing, and other signs of allergies.  These medications are available over-the-counter. Generics: Cetirizine (generic for Zyrtec) and loratadine (generic for Claritin). Fluticasone: Use fluticasone (generic for Flonase), as directed, for nasal and sinus congestion.  This medication is available over-the-counter. Congestion: Plain guaifenesin (generic for plain Mucinex) may help relieve congestion. Saline sinus rinses and saline nasal sprays may also help relieve congestion.  Follow up: Follow up with a primary care provider this week. Return: Return to the ED for significantly worsening symptoms, shortness of breath, persistent vomiting, or any other major concerns.

## 2017-11-06 ENCOUNTER — Encounter: Payer: Self-pay | Admitting: Family Medicine

## 2017-11-06 ENCOUNTER — Ambulatory Visit (INDEPENDENT_AMBULATORY_CARE_PROVIDER_SITE_OTHER): Payer: Medicare Other | Admitting: Family Medicine

## 2017-11-06 VITALS — BP 130/78 | HR 70 | Temp 98.3°F | Ht 61.0 in | Wt 188.5 lb

## 2017-11-06 DIAGNOSIS — E041 Nontoxic single thyroid nodule: Secondary | ICD-10-CM

## 2017-11-06 DIAGNOSIS — R05 Cough: Secondary | ICD-10-CM

## 2017-11-06 DIAGNOSIS — R059 Cough, unspecified: Secondary | ICD-10-CM | POA: Insufficient documentation

## 2017-11-06 DIAGNOSIS — Z9181 History of falling: Secondary | ICD-10-CM | POA: Diagnosis not present

## 2017-11-06 NOTE — Assessment & Plan Note (Signed)
No fall in last 2 months. Minimizing amitriptyline (QHS PRN).

## 2017-11-06 NOTE — Assessment & Plan Note (Addendum)
Reviewed recent thyroid US. L nodule meets criteria for continued yearly monitoring. Next [place] will be due 09/2018

## 2017-11-06 NOTE — Assessment & Plan Note (Signed)
Anticipate post-bronchitis cough that responded to steroid course. Using albuterol PRN. Continues recovering well.

## 2017-11-06 NOTE — Patient Instructions (Addendum)
I'm glad you're doing better. Continue albuterol as needed Keep physical appointment for next month.

## 2017-11-06 NOTE — Progress Notes (Signed)
BP 130/78 (BP Location: Left Arm, Patient Position: Sitting, Cuff Size: Normal)   Pulse 70   Temp 98.3 F (36.8 C) (Oral)   Ht _0  (1.549 m)   Wt 188 lb 8 oz (85.5 kg)   SpO2 97%   BMI 35.62 kg/m    CC: ER f/u visit Subjective:    Patient ID: Alexandria Leach, female    DOB: 08-10-1950, 67 y.o.   MRN: [id number]  HPI: Alexandria Leach is a 67 y.o. female presenting on 11/06/2017 for Hospitalization Follow-up (Seen at [facility] ED on 11/02/17, dx Cough.)   Seen at ER 8/18 with shortness of breath and L posterior thoracic pain, note reviewed. CXR showed bronchitis changes. She was treated for cough with albuterol inhaler and prednisone course (55m bid). BNP at that time was 166 (better than previous values). This has helped - cough is improving. Now using albuterol inhaler BID. Dyspnea is improved, left chest side pain is better. No fevers/chills. Cough was productive of yellow/green mucous, now improved.   [facility] to ER visit[facility].   Depression/anxiety - followed by psych Dr RHarrington Challengeron lexapro 270mbid and amitriptyline 2564mightly, as well as xanax 1mg10mD PRN.   Thyroid US [id number] - L inferior thyroid nodule met criteria for yearly surveillance for 5 yrs  Relevant past medical, surgical, family and social history reviewed and updated as indicated. Interim medical history since our last visit reviewed. Allergies and medications reviewed and updated. Outpatient Medications Prior to Visit  Medication Sig Dispense Refill  . albuterol (PROVENTIL HFA;VENTOLIN HFA) 108 (90 Base) MCG/ACT inhaler Inhale 2 puffs into the lungs every 4 (four) hours as needed for wheezing or shortness of breath. 1 Inhaler 0  . ALPRAZolam (XANAX) 1 MG tablet Take 1 tablet (1 mg total) by mouth 3 (three) times daily as needed for anxiety. 270 tablet 1  . amitriptyline (ELAVIL) 25 MG tablet Take 1 tablet (25 mg total) by mouth at bedtime. 90  tablet 2  . amLODipine (NORVASC) 10 MG tablet TAKE 1 TABLET BY MOUTH  DAILY 90 tablet 1  . antiseptic oral rinse (BIOTENE) LIQD 15 mLs by Mouth Rinse route at bedtime.     . asMarland Kitchenirin 325 MG EC tablet Take 325 mg by mouth daily.     . cholecalciferol (VITAMIN D) 1000 UNITS tablet Take 1,000 Units by mouth daily.     . diclofenac sodium (VOLTAREN) 1 % GEL Apply 1 application topically 2 (two) times daily as needed (pain).    . esMarland Kitchenitalopram (LEXAPRO) 20 MG tablet Take 1 tablet (20 mg total) by mouth 2 (two) times daily. 60 tablet 2  . ezetimibe (ZETIA) 10 MG tablet TAKE 1 TABLET BY MOUTH AT  BEDTIME 90 tablet 2  . famotidine (PEPCID) 20 MG tablet TAKE 1 TABLET BY MOUTH TWO  TIMES DAILY 180 tablet 2  . furosemide (LASIX) 20 MG tablet TAKE 1 TABLET BY MOUTH  DAILY AS NEEDED FOR EDEMA 90 tablet 1  . gabapentin (NEURONTIN) 300 MG capsule TAKE 2 CAPSULES EVERY  MORNING, 1 CAPSULE EVERY  AFTERNOON AND 2 CAPSULES AT BEDTIME. 450 capsule 1  . hydrochlorothiazide (MICROZIDE) 12.5 MG capsule TAKE 1 CAPSULE BY MOUTH  DAILY 90 capsule 3  . lovastatin (MEVACOR) 10 MG tablet Take 1 tablet (10 mg total) by mouth every Monday, Wednesday, and Friday. 70 tablet 1  . metoprolol succinate (TOPROL-XL) 25  MG 24 hr tablet Take 3 tablets (75 mg total) by mouth 2 (two) times daily. Take with or immediately following a meal 540 tablet 3  . Polyethyl Glycol-Propyl Glycol (SYSTANE) 0.4-0.3 % GEL Place 1 drop into both eyes 2 (two) times daily.     . RESTASIS 0.05 % ophthalmic emulsion INSTILL ONE DROP IN BOTH  EYES TWO TIMES DAILY 180 each 0  . traMADol (ULTRAM) 50 MG tablet TAKE 1 TABLET BY MOUTH 3  TIMES A DAY 90 tablet 0  . tiZANidine (ZANAFLEX) 2 MG tablet Take 1 tablet (2 mg total) by mouth 3 (three) times daily as needed for muscle spasms (sedation precautions). 30 tablet 0  . predniSONE (DELTASONE) 10 MG tablet Take 2 tablets (20 mg total) by mouth 2 (two) times daily with a meal for 3 days. 12 tablet 0   No  facility-administered medications prior to visit.      Per HPI unless specifically indicated in ROS section below Review of Systems     Objective:    BP 130/78 (BP Location: Left Arm, Patient Position: Sitting, Cuff Size: Normal)   Pulse 70   Temp 98.3 F (36.8 C) (Oral)   Ht _0  (1.549 m)   Wt 188 lb 8 oz (85.5 kg)   SpO2 97%   BMI 35.62 kg/m   Wt Readings from Last 3 Encounters:  11/06/17 188 lb 8 oz (85.5 kg)  09/25/17 187 lb (84.8 kg)  09/16/17 189 lb (85.7 kg)    Physical Exam  Constitutional: She appears well-developed and well-nourished. No distress.  HENT:  Mouth/Throat: Oropharynx is clear and moist. No oropharyngeal exudate.  Cardiovascular: Normal rate, regular rhythm and normal heart sounds.  No murmur heard. Pulmonary/Chest: Effort normal and breath sounds normal. No respiratory distress. She has no wheezes. She has no rales.  Musculoskeletal: She exhibits no edema.  Nursing note and vitals reviewed.  Results for orders placed or performed during the hospital encounter of 11/02/17  Comprehensive metabolic panel  Result Value Ref Range   Sodium 143 135 - 145 mmol/L   Potassium 3.9 3.5 - 5.1 mmol/L   Chloride 113 (H) 98 - 111 mmol/L   CO2 23 22 - 32 mmol/L   Glucose, Bld 93 70 - 99 mg/dL   BUN 6 (L) 8 - 23 mg/dL   Creatinine, Ser 1.14 (H) 0.44 - 1.00 mg/dL   Calcium 9.0 8.9 - 10.3 mg/dL   Total Protein 7.1 6.5 - 8.1 g/dL   Albumin 3.5 3.5 - 5.0 g/dL   AST 24 15 - 41 U/L   ALT 11 0 - 44 U/L   Alkaline Phosphatase 66 38 - 126 U/L   Total Bilirubin 0.6 0.3 - 1.2 mg/dL   GFR calc non Af Amer 49 (L) >60 mL/min   GFR calc Af Amer 57 (L) >60 mL/min   Anion gap 7 5 - 15  CBC with Differential  Result Value Ref Range   WBC 5.8 4.0 - 10.5 K/uL   RBC 4.50 3.87 - 5.11 MIL/uL   Hemoglobin 12.0 12.0 - 15.0 g/dL   HCT 39.2 36.0 - 46.0 %   MCV 87.1 78.0 - 100.0 fL   MCH 26.7 26.0 - 34.0 pg   MCHC 30.6 30.0 - 36.0 g/dL   RDW 15.5 11.5 - 15.5 %   Platelets  205 150 - 400 K/uL   Neutrophils Relative % 65 %   Neutro Abs 3.8 1.7 - 7.7 K/uL   Lymphocytes Relative 18 %  Lymphs Abs 1.0 0.7 - 4.0 K/uL   Monocytes Relative 12 %   Monocytes Absolute 0.7 0.1 - 1.0 K/uL   Eosinophils Relative 4 %   Eosinophils Absolute 0.2 0.0 - 0.7 K/uL   Basophils Relative 1 %   Basophils Absolute 0.0 0.0 - 0.1 K/uL   Immature Granulocytes 0 %   Abs Immature Granulocytes 0.0 0.0 - 0.1 K/uL  Brain natriuretic peptide  Result Value Ref Range   B Natriuretic Peptide 166.0 (H) 0.0 - 100.0 pg/mL  I-stat troponin, ED  Result Value Ref Range   Troponin i, poc 0.00 0.00 - 0.08 ng/mL   Comment 3              Assessment & Plan:   Problem List Items Addressed This Visit    Thyroid nodule    Reviewed recent thyroid US. L nodule meets criteria for continued yearly monitoring. Next [place] will be due 09/2018      History of fall    No fall in last 2 months. Minimizing amitriptyline (QHS PRN).       Cough - Primary    Anticipate post-bronchitis cough that responded to steroid course. Using albuterol PRN. Continues recovering well.           No orders of the defined types were placed in this encounter.  No orders of the defined types were placed in this encounter.   Follow up plan: Return at next scheduled appt.  Ria Bush, MD

## 2017-11-10 NOTE — Progress Notes (Signed)
Office Visit Note  Patient: Alexandria Leach             Date of Birth: Sep 29, 1950           MRN: [id number]             PCP: Ria Bush, MD Referring: Ria Bush, MD Visit Date: 11/24/2017 Occupation: @GUAROCC @  Subjective:  Trochanteric bursitis bilaterally   History of Present Illness: Alexandria Leach is a 67 y.o. female with history of Sjogren's syndrome, DDD, and fibromyalgia.  She takes tramadol 50 mg 1 tablet 3 times daily as needed for pain relief.  She continues to have generalized muscle aches and muscle tenderness due to fibromyalgia.  She had a fibromyalgia flare 1 week ago.  She continues to have trochanteric bursitis bilaterally.  Her fatigue has been worsening lately.  She continues to have chronic lower back pain but denies any radiation of pain or numbness.  She states that she has been sleeping better at night.  She states that she is still has been having some muscle weakness in her lower extremities.  She states that she had physical therapist come to her house in August which helped significantly.  She has been trying to perform some of the strengthening exercises on her own.  She states that she is very nervous getting in and out of the bathtub due to feeling her legs are to give out on her. She denies falls recently.  She reports that she has been having some discomfort in her right hand and has noticed intermittent swelling.  She states she also experiences some numbness intermittently.  She continues to take gabapentin daily for treatment of neuropathy.  She has Voltaren gel as needed.  She reports mild eye dryness and mouth dryness at this time.      Activities of Daily Living:  Patient reports morning stiffness for 30 minutes.   Patient Reports nocturnal pain.  Difficulty dressing/grooming: Reports Difficulty climbing stairs: Reports Difficulty getting out of chair: Reports Difficulty using hands for taps, buttons, cutlery, and/or writing:  Reports  Review of Systems  Constitutional: Positive for fatigue.  HENT: Negative for mouth sores, mouth dryness and nose dryness.   Eyes: Negative for pain, visual disturbance and dryness.  Respiratory: Negative for cough, hemoptysis, shortness of breath and difficulty breathing.   Cardiovascular: Positive for swelling in legs/feet. Negative for chest pain, palpitations and hypertension.  Gastrointestinal: Negative for blood in stool, constipation and diarrhea.  Endocrine: Negative for increased urination.  Genitourinary: Negative for decreased urine output and painful urination.  Musculoskeletal: Positive for arthralgias, joint pain, joint swelling, muscle weakness, morning stiffness and muscle tenderness. Negative for myalgias and myalgias.  Skin: Negative for color change, pallor, rash, hair loss, nodules/bumps, skin tightness, ulcers and sensitivity to sunlight.  Allergic/Immunologic: Negative for susceptible to infections.  Neurological: Negative for dizziness, numbness and headaches.  Hematological: Negative for bruising/bleeding tendency and swollen glands.  Psychiatric/Behavioral: Positive for sleep disturbance. Negative for depressed mood. The patient is not nervous/anxious.     PMFS History:  Patient Active Problem List   Diagnosis Date Noted  . Cough 11/06/2017  . Thyroid nodule 09/16/2017  . Spasm of muscle of lower back 07/30/2017  . Anterior neck pain 05/14/2017  . Sialoadenitis of submandibular gland 10/29/2016  . High risk medication use 06/05/2016  . Peripheral neuropathy 01/30/2016  . DNR no code (do not resuscitate) 12/08/2015  . 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 TIA (transient ischemic attack)  10/12/2015  . Abnormal urinalysis 10/12/2015  . Mild mitral regurgitation 10/12/2015  . Osteopenia 09/18/2015  . Right arm pain 08/29/2015  . History of fall   . Trimalleolar fracture of left ankle 02/23/2015  . Right sided sciatica 02/21/2015   . Imbalance 01/12/2015  . Transaminitis 12/24/2014  . DDD (degenerative disc disease), cervical   . Health maintenance examination 10/18/2014  . Advanced care planning/counseling discussion 10/18/2014  . Medicare annual wellness visit, initial 10/18/2014  . Refusal of blood transfusions as patient is Jehovah's Witness   . Sjogren's syndrome ([place])   . CKD (chronic kidney disease) stage 3, GFR 30-59 ml/min (HCC)   . Glaucoma   . Fibromyalgia   . Osteoarthritis   . History of pulmonary embolism   . Lung nodule 09/22/2012  . Diastolic CHF ([place]) 25/85/2778  . Aortic stenosis 04/22/2011  . Palpitations 08/10/2010  . Dyspnea 08/10/2010  . HOT FLASHES 06/28/2008  . Trochanteric bursitis of right hip 06/28/2008  . ARTHRALGIA 07/01/2007  . BACK PAIN, LEFT 08/25/2006  . GAD (generalized anxiety disorder) 03/28/2006  . MITRAL VALVE REPLACEMENT, HX OF 03/28/2006  . TAH/BSO, HX OF 03/28/2006  . HYPERCHOLESTEROLEMIA 12/31/2005  . MDD (major depressive disorder), recurrent episode ([place]) 12/31/2005  . RHEUMATIC HEART DISEASE 12/31/2005  . Essential hypertension 12/31/2005  . Chronic insomnia 12/31/2005    Past Medical History:  Diagnosis Date  . Ankle fracture, left 02/19/2015   from a fall  . Anxiety   . CHF (congestive heart failure) ([place])   . CKD (chronic kidney disease) stage 3, GFR 30-59 ml/min [facility])    saw nephrologist Dr Harden Mo  . DDD (degenerative disc disease), cervical   . Depression   . Fibromyalgia   . Glaucoma    s/p surgery, sees ophtho Q6 mo  . 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 History of kidney stones 2010  . History of pulmonary embolism 2001, 2006   completed coumadin courses  . History of rheumatic fever x3  . HLD (hyperlipidemia)   . HTN (hypertension)   . Insomnia   . Lung nodule 09/22/2012   RLL - 61mm, stable since 2014. Thought benign.   . Osteoarthritis    shoulders and knees, not RA  per Dr Estanislado Pandy, positive ANA, positive Ro  . Osteopenia 09/18/2015   DEXA T -1.1 hip, -0.2 spine 08/2015   . Personal history of urinary calculi latest 2014  . Pneumonia 12/02/2011  . PONV (postoperative nausea and vomiting)   . Refusal of blood transfusions as patient is Jehovah's Witness   . Rheumatic heart disease 1980   s/p mitral valve repair 1980  . Sjogren's syndrome ([place])     Family History  Problem Relation Age of Onset  . Lupus Sister        and niece  . Cancer Mother        lung (nonsmoker)  . CAD Mother        MI in her 56s  . ALS Mother   . Kidney disease Father   . Alcohol abuse Father   . Diabetes Father   . Cancer Brother        bone  . Diabetes Sister   . Stroke Sister   . Cancer Maternal Uncle        bone  . Depression Sister   . Kidney failure Other        on HD  . Diabetes Brother   .  Heart attack Brother   . Stroke Maternal Grandmother    Past Surgical History:  Procedure Laterality Date  . BREAST BIOPSY Right 2006   benign  . CHOLECYSTECTOMY  11/27/2011   Procedure: LAPAROSCOPIC CHOLECYSTECTOMY WITH INTRAOPERATIVE CHOLANGIOGRAM;  Surgeon: Adin Hector, MD;  Location: [place];  Service: General;  Laterality: N/A;  laparoscopic cholecystectomy with choleangiogram umbilical hernia repair  . COLONOSCOPY  07/2014   WNL Amedeo Plenty)  . dexa  08/2012   normal per patient - no records available  . MITRAL VALVE REPAIR  1980   open heart  . ORIF ANKLE FRACTURE Left 02/26/2015   Procedure: OPEN REDUCTION INTERNAL FIXATION (ORIF) LEFT TRIMALLEOLAR ANKLE FRACTURE;  Surgeon: [name], MD;  Location: [place];  Service: Orthopedics;  Laterality: Left;  . TUBAL LIGATION  1980  . UMBILICAL HERNIA REPAIR  11/27/2011   Procedure: HERNIA REPAIR UMBILICAL ADULT;  Surgeon: Adin Hector, MD;  Location: [place];  Service: General;  Laterality: N/A;  . [place]   for fibroids -- partial, ovaries remain   Social History   Social History Narrative    Lives with son, 1 dog   Occupation: unemployed, on disability for fibromyalgia since 2008.   Edu: HS   Religion: Jehova's witness   Activity: volunteers at senior center   Diet: some water, fruits/vegetables daily   No caffeine use    Objective: Vital Signs: BP 124/74 (BP Location: Left Arm, Patient Position: Sitting, Cuff Size: Normal)   Pulse 75   Resp 16   Ht 5\' 1"  (1.549 m)   Wt 193 lb 3.2 oz (87.6 kg)   BMI 36.50 kg/m    Physical Exam  Constitutional: She is oriented to person, place, and time. She appears well-developed and well-nourished.  HENT:  Head: Normocephalic and atraumatic.  No parotid swelling.  Eyes: Conjunctivae and EOM are normal.  Neck: Normal range of motion.  Cardiovascular: Normal rate, regular rhythm, normal heart sounds and intact distal pulses.  Pulmonary/Chest: Effort normal and breath sounds normal.  Abdominal: Soft. Bowel sounds are normal.  Lymphadenopathy:    She has no cervical adenopathy.  Neurological: She is alert and oriented to person, place, and time.  Skin: Skin is warm and dry. Capillary refill takes less than 2 seconds.  Psychiatric: She has a normal mood and affect. Her behavior is normal.  Nursing note and vitals reviewed.    Musculoskeletal Exam: C-spine, thoracic spine, lumbar spine good range of motion.  No midline spinal tenderness.  No SI joint tenderness.  Shoulder joints, elbow joints, wrist joints, MCPs and PIPs, DIPs good range of motion with no synovitis.  She is complete fist formation bilaterally.  She has DIP synovial thickening consistent with osteoarthritis of bilateral hands.  Hip joints, knee joints, ankle joints and MTPs, PIPs, DIPs good range of motion with no synovitis.  She has bilateral knee crepitus.  No warmth or effusion was noted.  She has bilateral trochanteric bursa tenderness.  CDAI Exam: CDAI Score: Not documented Patient Global Assessment: Not documented; Provider Global Assessment: Not  documented Swollen: Not documented; Tender: Not documented Joint Exam   Not documented   There is currently no information documented on the homunculus. Go to the Rheumatology activity and complete the homunculus joint exam.  Investigation: No additional findings.  Imaging: Dg Chest 2 View  Result Date: 11/02/2017 CLINICAL DATA:  Cough and shortness of breath. EXAM: CHEST - 2 VIEW COMPARISON:  Chest x-ray dated September 20, 2016.  FINDINGS: Stable mild cardiomegaly. Normal mediastinal contours. Normal pulmonary vascularity. Mild peribronchial thickening. No focal consolidation, pleural effusion, or pneumothorax. No acute osseous abnormality. Prior median sternotomy. IMPRESSION: Bronchitic changes.  No consolidation. Electronically Signed   By: Titus Dubin M.D.   On: 11/02/2017 09:04    Recent Labs: Lab Results  Component Value Date   WBC 5.8 11/02/2017   HGB 12.0 11/02/2017   PLT 205 11/02/2017   NA 143 11/02/2017   K 3.9 11/02/2017   CL 113 (H) 11/02/2017   CO2 23 11/02/2017   GLUCOSE 93 11/02/2017   BUN 6 (L) 11/02/2017   CREATININE 1.14 (H) 11/02/2017   BILITOT 0.6 11/02/2017   ALKPHOS 66 11/02/2017   AST 24 11/02/2017   ALT 11 11/02/2017   PROT 7.1 11/02/2017   ALBUMIN 3.5 11/02/2017   CALCIUM 9.0 11/02/2017   GFRAA 57 (L) 11/02/2017    Speciality Comments: No specialty comments available.  Procedures:  No procedures performed Allergies: Cymbalta [duloxetine hcl]; Statins; and Sulfa antibiotics   Assessment / Plan:     Visit Diagnoses: Sjogren's syndrome, with unspecified organ involvement ([place]) - +ANA, +Ro: She has very mild sicca symptoms at this time.  She uses Restasis eyedrops on a regular basis as well as Biotene products.  She has no parotid swelling on exam.  SPEP WNL and RF negative on 05/16/17. CBC and CMP were drawn on 11/02/17.   Fibromyalgia: She has generalized muscle aches and muscle tenderness.  She had a fibromyalgia flare 1 week ago.  She continues to  have chronic fatigue.  She has been sleeping well at night.  She continues to have bilateral trochanteric bursitis and she performs stretching exercises.  She is also taking Gabapentin daily for neuropathy.   She was working with a physical therapist in August 2019, which helped with muscle strengthening and fall prevention. She continues to perform exercises on a regular basis.  We discussed the importance of muscle strengthening and fall prevention. She takes Tramadol 50 mg TID PRN for pain relief. Narcotic agreement was updated today.   Osteopenia of multiple sites: She is taking vitamin D 1,000 units by mouth daily.   DDD (degenerative disc disease), cervical: She has good ROM with no discomfort at this time.   Other chronic pain - tramadol 50 mg TID PRN for pain relief.  UDS: 5/28/2019Narc agreement: updated today.  Other insomnia: Her insomnia has improved.  Good sleep hygiene was discussed.   Other fatigue: Chronic.  She was encouraged to stay active and exercise on a regular basis.    Other medical conditions are listed as follows:   History of pulmonary embolism  History of hypertension  CKD (chronic kidney disease) stage 3, GFR 30-59 ml/min (HCC)  History of anxiety  History of hypercholesterolemia   Orders: No orders of the defined types were placed in this encounter.  No orders of the defined types were placed in this encounter.    Follow-Up Instructions: Return in about 6 months (around 05/25/2018) for Sjogren's syndrome, Fibromyalgia, DDD.   Ofilia Neas, PA-C   I examined and evaluated the patient with Hazel Sams PA.  She continues to have some sicca symptoms.  She has some generalized pain from fibromyalgia.  She had mild tenderness on palpation over bilateral trochanteric bursa.  The plan of care was discussed as noted above.  Bo Merino, MD Note - This record has been created using Editor, commissioning.  Chart creation errors have been sought, but may not  always  have been located. Such creation errors do not reflect on  the standard of medical care.

## 2017-11-13 ENCOUNTER — Other Ambulatory Visit: Payer: Self-pay | Admitting: Family Medicine

## 2017-11-13 NOTE — Telephone Encounter (Signed)
Gabapentin Last filled:  09/25/17, #450 Last OV:  11/06/17 Next OV (CPE):  12/15/17

## 2017-11-24 ENCOUNTER — Encounter: Payer: Self-pay | Admitting: Rheumatology

## 2017-11-24 ENCOUNTER — Ambulatory Visit (INDEPENDENT_AMBULATORY_CARE_PROVIDER_SITE_OTHER): Payer: Medicare Other | Admitting: Rheumatology

## 2017-11-24 VITALS — BP 124/74 | HR 75 | Resp 16 | Ht 61.0 in | Wt 193.2 lb

## 2017-11-24 DIAGNOSIS — M3500 Sicca syndrome, unspecified: Secondary | ICD-10-CM | POA: Diagnosis not present

## 2017-11-24 DIAGNOSIS — Z8659 Personal history of other mental and behavioral disorders: Secondary | ICD-10-CM

## 2017-11-24 DIAGNOSIS — N183 Chronic kidney disease, stage 3 unspecified: Secondary | ICD-10-CM

## 2017-11-24 DIAGNOSIS — M5030 Other cervical disc degeneration, unspecified cervical region: Secondary | ICD-10-CM

## 2017-11-24 DIAGNOSIS — M797 Fibromyalgia: Secondary | ICD-10-CM | POA: Diagnosis not present

## 2017-11-24 DIAGNOSIS — M8589 Other specified disorders of bone density and structure, multiple sites: Secondary | ICD-10-CM

## 2017-11-24 DIAGNOSIS — G8929 Other chronic pain: Secondary | ICD-10-CM | POA: Diagnosis not present

## 2017-11-24 DIAGNOSIS — Z8679 Personal history of other diseases of the circulatory system: Secondary | ICD-10-CM

## 2017-11-24 DIAGNOSIS — R5383 Other fatigue: Secondary | ICD-10-CM

## 2017-11-24 DIAGNOSIS — Z8639 Personal history of other endocrine, nutritional and metabolic disease: Secondary | ICD-10-CM

## 2017-11-24 DIAGNOSIS — Z86711 Personal history of pulmonary embolism: Secondary | ICD-10-CM

## 2017-11-24 DIAGNOSIS — G4709 Other insomnia: Secondary | ICD-10-CM

## 2017-12-10 ENCOUNTER — Other Ambulatory Visit: Payer: Self-pay | Admitting: Family Medicine

## 2017-12-10 DIAGNOSIS — E7800 Pure hypercholesterolemia, unspecified: Secondary | ICD-10-CM

## 2017-12-10 DIAGNOSIS — G6289 Other specified polyneuropathies: Secondary | ICD-10-CM

## 2017-12-10 DIAGNOSIS — N183 Chronic kidney disease, stage 3 unspecified: Secondary | ICD-10-CM

## 2017-12-11 ENCOUNTER — Ambulatory Visit (INDEPENDENT_AMBULATORY_CARE_PROVIDER_SITE_OTHER): Payer: Medicare Other

## 2017-12-11 VITALS — BP 114/76 | HR 71 | Temp 98.7°F | Ht 63.0 in | Wt 188.8 lb

## 2017-12-11 DIAGNOSIS — E7800 Pure hypercholesterolemia, unspecified: Secondary | ICD-10-CM

## 2017-12-11 DIAGNOSIS — N183 Chronic kidney disease, stage 3 unspecified: Secondary | ICD-10-CM

## 2017-12-11 DIAGNOSIS — Z0000 Encounter for general adult medical examination without abnormal findings: Secondary | ICD-10-CM | POA: Diagnosis not present

## 2017-12-11 LAB — RENAL FUNCTION PANEL
ALBUMIN: 4.3 g/dL (ref 3.5–5.2)
BUN: 9 mg/dL (ref 6–23)
CALCIUM: 9.8 mg/dL (ref 8.4–10.5)
CHLORIDE: 106 meq/L (ref 96–112)
CO2: 23 mEq/L (ref 19–32)
Creatinine, Ser: 1.18 mg/dL (ref 0.40–1.20)
GFR: 59.05 mL/min — ABNORMAL LOW (ref >60.00)
Glucose, Bld: 99 mg/dL (ref 70–99)
POTASSIUM: 3.9 meq/L (ref 3.5–5.1)
Phosphorus: 3.1 mg/dL (ref 2.3–4.6)
SODIUM: 139 meq/L (ref 135–145)

## 2017-12-11 LAB — LIPID PANEL
Cholesterol: 239 mg/dL — ABNORMAL HIGH (ref 0–200)
HDL: 62.2 mg/dL (ref >39.00)
LDL Cholesterol: 158 mg/dL — ABNORMAL HIGH (ref 0–99)
NonHDL: 176.47
Total CHOL/HDL Ratio: 4
Triglycerides: 93 mg/dL (ref 0.0–149.0)
VLDL: 18.6 mg/dL (ref 0.0–40.0)

## 2017-12-11 LAB — TSH: TSH: 2.72 u[IU]/mL (ref 0.35–4.50)

## 2017-12-11 NOTE — Progress Notes (Signed)
PCP notes:   Health maintenance:  Flu vaccine - pt declined PNA vaccine - pt declined  Abnormal screenings:   Fall risk - hx of single fall Fall Risk  12/11/2017 12/09/2016 11/30/2015 11/30/2015 11/07/2015  Falls in the past year? Yes No - Yes Yes  Comment lost balance and fell in August 2019 - - - -  Number falls in past yr: 1 - - 2 or more 2 or more  Comment - - - - -  Injury with Fall? Yes - - Yes Yes  Comment laceration to forehead; treatment in urgent care - - - left ankle fx  Risk Factor Category  - - (No Data) High Fall Risk -  Comment - - pt reports she has had 11 falls between 2016 and 2017 - -  Risk for fall due to : - - - - Impaired mobility;Impaired balance/gait  Follow up - - - Falls evaluation completed;Education provided;Falls prevention discussed -   Patient concerns:   None  Nurse concerns:  None  Next PCP appt:   12/15/17 @ 0930  I reviewed health advisor's note, was available for consultation on the day of service listed in this note, and agree with documentation and plan. Elsie Stain, MD.

## 2017-12-11 NOTE — Patient Instructions (Addendum)
Ms. Danielski , Thank you for taking time to come for your Medicare Wellness Visit. I appreciate your ongoing commitment to your health goals. Please review the following plan we discussed and let me know if I can assist you in the future.   These are the goals we discussed: Goals    . Increase physical activity     Starting 12/11/2017, I will continue to walk at least 60 min day/4 days per week.        This is a list of the screening recommended for you and due dates:  Health Maintenance  Topic Date Due  . DTaP/Tdap/Td vaccine (1 - Tdap) 11/29/2025*  . Tetanus Vaccine  11/29/2025*  . Flu Shot  03/17/2049*  . Pneumonia vaccines (1 of 2 - PCV13) 03/17/2049*  . Mammogram  08/28/2018  . Colon Cancer Screening  07/26/2024  . DEXA scan (bone density measurement)  Completed  .  Hepatitis C: One time screening is recommended by Center for Disease Control  (CDC) for  adults born from [birth year] through 1963.   Completed  . HIV Screening  Completed  *Topic was postponed. The date shown is not the original due date.   Preventive Care for Adults  A healthy lifestyle and preventive care can promote health and wellness. Preventive health guidelines for adults include the following key practices.  . A routine yearly physical is a good way to check with your health care provider about your health and preventive screening. It is a chance to share any concerns and updates on your health and to receive a thorough exam.  . Visit your dentist for a routine exam and preventive care every 6 months. Brush your teeth twice a day and floss once a day. Good oral hygiene prevents tooth decay and gum disease.  . The frequency of eye exams is based on your age, health, family medical history, use  of contact lenses, and other factors. Follow your health care provider's recommendations for frequency of eye exams.  . Eat a healthy diet. Foods like vegetables, fruits, whole grains, low-fat dairy products, and lean  protein foods contain the nutrients you need without too many calories. Decrease your intake of foods high in solid fats, added sugars, and salt. Eat the right amount of calories for you. Get information about a proper diet from your health care provider, if necessary.  . Regular physical exercise is one of the most important things you can do for your health. Most adults should get at least 150 minutes of moderate-intensity exercise (any activity that increases your heart rate and causes you to sweat) each week. In addition, most adults need muscle-strengthening exercises on 2 or more days a week.  Silver Sneakers may be a benefit available to you. To determine eligibility, you may visit the website: www.silversneakers.com or contact program at 980-475-8362 Mon-Fri between 8AM-8PM.   . Maintain a healthy weight. The body mass index (BMI) is a screening tool to identify possible weight problems. It provides an estimate of body fat based on height and weight. Your health care provider can find your BMI and can help you achieve or maintain a healthy weight.   For adults 20 years and older: ? A BMI below 18.5 is considered underweight. ? A BMI of 18.5 to 24.9 is normal. ? A BMI of 25 to 29.9 is considered overweight. ? A BMI of 30 and above is considered obese.   . Maintain normal blood lipids and cholesterol levels by exercising and minimizing  your intake of saturated fat. Eat a balanced diet with plenty of fruit and vegetables. Blood tests for lipids and cholesterol should begin at age 65 and be repeated every 5 years. If your lipid or cholesterol levels are high, you are over 50, or you are at high risk for heart disease, you may need your cholesterol levels checked more frequently. Ongoing high lipid and cholesterol levels should be treated with medicines if diet and exercise are not working.  . If you smoke, find out from your health care provider how to quit. If you do not use tobacco, please  do not start.  . If you choose to drink alcohol, please do not consume more than 2 drinks per day. One drink is considered to be 12 ounces (355 mL) of beer, 5 ounces (148 mL) of wine, or 1.5 ounces (44 mL) of liquor.  . If you are 100-31 years old, ask your health care provider if you should take aspirin to prevent strokes.  . Use sunscreen. Apply sunscreen liberally and repeatedly throughout the day. You should seek shade when your shadow is shorter than you. Protect yourself by wearing long sleeves, pants, a wide-brimmed hat, and sunglasses year round, whenever you are outdoors.  . Once a month, do a whole body skin exam, using a mirror to look at the skin on your back. Tell your health care provider of new moles, moles that have irregular borders, moles that are larger than a pencil eraser, or moles that have changed in shape or color.

## 2017-12-11 NOTE — Progress Notes (Signed)
Subjective:   Alexandria Leach is a 67 y.o. female who presents for Medicare Annual (Subsequent) preventive examination.  Review of Systems:  N/A Cardiac Risk Factors include: advanced age (>5men, >14 women);hypertension;dyslipidemia;obesity (BMI >30kg/m2)     Objective:     Vitals: BP 114/76 (BP Location: Right Arm, Patient Position: Sitting, Cuff Size: Normal)   Pulse 71   Temp 98.7 F (37.1 C) (Oral)   Ht 5\' 3"  (1.6 m) Comment: shoes  Wt 188 lb 12 oz (85.6 kg)   SpO2 97%   BMI 33.44 kg/m   Body mass index is 33.44 kg/m.  Advanced Directives 12/11/2017 12/09/2016 11/15/2016 12/13/2015 12/13/2015 11/30/2015 10/12/2015  Does Patient Have a Medical Advance Directive? Yes Yes Yes Yes Yes Yes Yes  Type of Paramedic of [place];Living will [place];Living will Living will;Healthcare Power of [place];Living will [place];Living will -  Does patient want to make changes to medical advance directive? - - - No - Patient declined - No - Patient declined -  Copy of [place] in Chart? Yes Yes No - copy requested Yes Yes Yes -  Would patient like information on creating a medical advance directive? - - - - - - -  Pre-existing out of facility DNR order (yellow form or pink MOST form) - - - - - - -    Tobacco Social History   Tobacco Use  Smoking Status Never Smoker  Smokeless Tobacco Never Used     Counseling given: No   Clinical Intake:  Pre-visit preparation completed: Yes  Pain Score: 7 (lower back)     Nutritional Status: BMI > 30  Obese Nutritional Risks: None Diabetes: No  How often do you need to have someone help you when you read instructions, pamphlets, or other written materials from your doctor or pharmacy?: 1 - Never What is the last grade level you completed in school?: 12th grade  Interpreter Needed?: No  Comments: pt  lives with spouse Information entered by :: LPinson, LPN  Past Medical History:  Diagnosis Date  . Ankle fracture, left 02/19/2015   from a fall  . Anxiety   . Cataract 2014   corrected with surgery  . CHF (congestive heart failure) ([place])   . CKD (chronic kidney disease) stage 3, GFR 30-59 ml/min [facility])    saw nephrologist Dr Harden Mo  . DDD (degenerative disc disease), cervical   . Depression   . Fibromyalgia   . Glaucoma    s/p surgery, sees ophtho Q6 mo  . 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 History of kidney stones 2010  . History of pulmonary embolism 2001, 2006   completed coumadin courses  . History of rheumatic fever x3  . HLD (hyperlipidemia)   . HTN (hypertension)   . Insomnia   . Lung nodule 09/22/2012   RLL - 71mm, stable since 2014. Thought benign.   . Osteoarthritis    shoulders and knees, not RA per Dr Estanislado Pandy, positive ANA, positive Ro  . Osteopenia 09/18/2015   DEXA T -1.1 hip, -0.2 spine 08/2015   . Personal history of urinary calculi latest 2014  . Pneumonia 12/02/2011  . PONV (postoperative nausea and vomiting)   . Refusal of blood transfusions as patient is Jehovah's Witness   . Rheumatic heart disease 1980   s/p mitral valve repair 1980  .  Sjogren's syndrome [facility])    Past Surgical History:  Procedure Laterality Date  . BREAST BIOPSY Right 2006   benign  . CHOLECYSTECTOMY  11/27/2011   Procedure: LAPAROSCOPIC CHOLECYSTECTOMY WITH INTRAOPERATIVE CHOLANGIOGRAM;  Surgeon: Adin Hector, MD;  Location: [place];  Service: General;  Laterality: N/A;  laparoscopic cholecystectomy with choleangiogram umbilical hernia repair  . COLONOSCOPY  07/2014   WNL Amedeo Plenty)  . dexa  08/2012   normal per patient - no records available  . EYE SURGERY Bilateral 2014   cataract removal  . MITRAL VALVE REPAIR  1980   open heart  . ORIF ANKLE FRACTURE Left 02/26/2015   Procedure: OPEN REDUCTION INTERNAL  FIXATION (ORIF) LEFT TRIMALLEOLAR ANKLE FRACTURE;  Surgeon: [name], MD;  Location: [place];  Service: Orthopedics;  Laterality: Left;  . TUBAL LIGATION  1980  . UMBILICAL HERNIA REPAIR  11/27/2011   Procedure: HERNIA REPAIR UMBILICAL ADULT;  Surgeon: Adin Hector, MD;  Location: [place];  Service: General;  Laterality: N/A;  . [place]   for fibroids -- partial, ovaries remain   Family History  Problem Relation Age of Onset  . Lupus Sister        and niece  . Cancer Mother        lung (nonsmoker)  . CAD Mother        MI in her 44s  . ALS Mother   . Kidney disease Father   . Alcohol abuse Father   . Diabetes Father   . Cancer Brother        bone  . Diabetes Sister   . Stroke Sister   . Cancer Maternal Uncle        bone  . Depression Sister   . Kidney failure Other        on HD  . Diabetes Brother   . Heart attack Brother   . Stroke Maternal Grandmother    Social History   Socioeconomic History  . Marital status: Married    Spouse name: [name]  . Number of children: 4  . Years of education: 84  . Highest education level: Not on file  Occupational History    Employer: UNEMPLOYED    Comment: Disability  Social Needs  . Financial resource strain: Not on file  . Food insecurity:    Worry: Not on file    Inability: Not on file  . Transportation needs:    Medical: Not on file    Non-medical: Not on file  Tobacco Use  . Smoking status: Never Smoker  . Smokeless tobacco: Never Used  Substance and Sexual Activity  . Alcohol use: No    Alcohol/week: 0.0 standard drinks  . Drug use: No  . Sexual activity: Not Currently    Birth control/protection: Surgical  Lifestyle  . Physical activity:    Days per week: Not on file    Minutes per session: Not on file  . Stress: Not on file  Relationships  . Social connections:    Talks on phone: Not on file    Gets together: Not on file    Attends religious service: Not on file    Active member of  club or organization: Not on file    Attends meetings of clubs or organizations: Not on file    Relationship status: Not on file  Other Topics Concern  . Not on file  Social History Narrative   Lives with son, 1 dog   Occupation: unemployed,  on disability for fibromyalgia since 2008.   Edu: HS   Religion: Jehova's witness   Activity: volunteers at senior center   Diet: some water, fruits/vegetables daily   No caffeine use    Outpatient Encounter Medications as of 12/11/2017  Medication Sig  . ALPRAZolam (XANAX) 1 MG tablet Take 1 tablet (1 mg total) by mouth 3 (three) times daily as needed for anxiety.  Marland Kitchen amitriptyline (ELAVIL) 25 MG tablet Take 1 tablet (25 mg total) by mouth at bedtime. (Patient taking differently: Take 25 mg by mouth 3 times/day as needed-between meals & bedtime. )  . amLODipine (NORVASC) 10 MG tablet TAKE 1 TABLET BY MOUTH  DAILY  . antiseptic oral rinse (BIOTENE) LIQD 15 mLs by Mouth Rinse route at bedtime.   Marland Kitchen aspirin 325 MG EC tablet Take 325 mg by mouth daily.   . cholecalciferol (VITAMIN D) 1000 UNITS tablet Take 1,000 Units by mouth daily.   . diclofenac sodium (VOLTAREN) 1 % GEL Apply 1 application topically 2 (two) times daily as needed (pain).  Marland Kitchen escitalopram (LEXAPRO) 20 MG tablet Take 1 tablet (20 mg total) by mouth 2 (two) times daily.  Marland Kitchen ezetimibe (ZETIA) 10 MG tablet TAKE 1 TABLET BY MOUTH AT  BEDTIME  . famotidine (PEPCID) 20 MG tablet TAKE 1 TABLET BY MOUTH TWO  TIMES DAILY  . furosemide (LASIX) 20 MG tablet TAKE 1 TABLET BY MOUTH  DAILY AS NEEDED FOR EDEMA  . gabapentin (NEURONTIN) 300 MG capsule TAKE 2 CAPSULES EVERY  MORNING, 1 CAPSULE EVERY  AFTERNOON AND 2 CAPSULES AT BEDTIME.  . hydrochlorothiazide (MICROZIDE) 12.5 MG capsule TAKE 1 CAPSULE BY MOUTH  DAILY  . lovastatin (MEVACOR) 10 MG tablet TAKE 1 TABLET BY MOUTH  EVERY MONDAY, WEDNESDAY,  AND FRIDAY  . metoprolol succinate (TOPROL-XL) 25 MG 24 hr tablet Take 3 tablets (75 mg total) by mouth  2 (two) times daily. Take with or immediately following a meal  . Polyethyl Glycol-Propyl Glycol (SYSTANE) 0.4-0.3 % GEL Place 1 drop into both eyes 2 (two) times daily.   . RESTASIS 0.05 % ophthalmic emulsion INSTILL ONE DROP IN BOTH  EYES TWO TIMES DAILY  . traMADol (ULTRAM) 50 MG tablet TAKE 1 TABLET BY MOUTH 3  TIMES A DAY  . [DISCONTINUED] albuterol (PROVENTIL HFA;VENTOLIN HFA) 108 (90 Base) MCG/ACT inhaler Inhale 2 puffs into the lungs every 4 (four) hours as needed for wheezing or shortness of breath.   No facility-administered encounter medications on file as of 12/11/2017.     Activities of Daily Living In your present state of health, do you have any difficulty performing the following activities: 12/11/2017  Hearing? 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 N  Difficulty concentrating or making decisions? Y  Walking or climbing stairs? Y  Dressing or bathing? N  Doing errands, shopping? N  Preparing Food and eating ? N  Using the Toilet? N  In the past six months, have you accidently leaked urine? N  Do you have problems with loss of bowel control? N  Managing your Medications? N  Managing your Finances? N  Housekeeping or managing your Housekeeping? N  Some recent data might be hidden    Patient Care Team: Ria Bush, MD as PCP - General (Family Medicine) Stanford Breed, [name], MD as Consulting Physician (Cardiology) Bo Merino, MD as Consulting Physician (Rheumatology) Cloria Spring, MD as Consulting Physician ([place]) Sharyne Peach, MD as Consulting Physician (Ophthalmology) Leta Baptist, Earlean Polka, MD as Consulting Physician (  Neurology)    Assessment:   This is a routine wellness examination for Donnamaria.   Hearing Screening   125Hz  250Hz  500Hz  1000Hz  2000Hz  3000Hz  4000Hz  6000Hz  8000Hz   Right ear:           Left ear:   40 40 40  40    Comments: Deaf in right ear  Vision Screening Comments: Vision exam in March 2019 with Dr.  Delman Cheadle    Exercise Activities and Dietary recommendations Current Exercise Habits: The patient does not participate in regular exercise at present, Exercise limited by: None identified  Goals    . Increase physical activity     Starting 12/11/2017, I will continue to walk at least 60 min day/4 days per week.        Fall Risk Fall Risk  12/11/2017 12/09/2016 11/30/2015 11/30/2015 11/07/2015  Falls in the past year? Yes No - Yes Yes  Comment lost balance and fell in August 2019 - - - -  Number falls in past yr: 1 - - 2 or more 2 or more  Comment - - - - -  Injury with Fall? Yes - - Yes Yes  Comment laceration to forehead; treatment in urgent care - - - left ankle fx  Risk Factor Category  - - (No Data) High Fall Risk -  Comment - - pt reports she has had 11 falls between 2016 and 2017 - -  Risk for fall due to : - - - - Impaired mobility;Impaired balance/gait  Follow up - - - Falls evaluation completed;Education provided;Falls prevention discussed -   Depression Screen PHQ 2/9 Scores 12/11/2017 12/09/2016 11/30/2015 10/18/2014  PHQ - 2 Score 0 2 4 3   PHQ- 9 Score 0 4 13 9      Cognitive Function MMSE - Mini Mental State Exam 12/11/2017 12/09/2016 11/30/2015  Orientation to time 5 5 5   Orientation to Place 5 5 5   Registration 3 3 3   Attention/ Calculation 0 0 0  Recall 3 2 1   Recall-comments - unable to recall 1 of 3 words pt was unable to recall 2 of 3 words  Language- name 2 objects 0 0 0  Language- repeat 1 1 1   Language- follow 3 step command 3 3 3   Language- read & follow direction 0 0 0  Write a sentence 0 0 0  Copy design 0 0 0  Total score 20 19 18        PLEASE NOTE: A Mini-Cog screen was completed. Maximum score is 20. A value of 0 denotes this part of [place] MMSE was not completed or the patient failed this part of the Mini-Cog screening.   Mini-Cog Screening Orientation to Time - Max 5 pts Orientation to Place - Max 5 pts Registration - Max 3 pts Recall - Max 3  pts Language Repeat - Max 1 pts Language Follow 3 Step Command - Max 3 pts   Immunization History  Administered Date(s) Administered  . Pneumococcal Polysaccharide-23 09/15/2012    Screening Tests Health Maintenance  Topic Date Due  . DTaP/Tdap/Td (1 - Tdap) 11/29/2025 (Originally 07/25/1971)  . TETANUS/TDAP  11/29/2025 (Originally 07/25/1971)  . INFLUENZA VACCINE  03/17/2049 (Originally 10/16/2017)  . PNA vac Low Risk Adult (1 of 2 - PCV13) 03/17/2049 (Originally 07/24/2017)  . MAMMOGRAM  08/28/2018  . COLONOSCOPY  07/26/2024  . DEXA SCAN  Completed  . Hepatitis C Screening  Completed  . HIV Screening  Completed       Plan:   I  have personally reviewed, addressed, and noted the following in the patient's chart:  A. Medical and social history B. Use of alcohol, tobacco or illicit drugs  C. Current medications and supplements D. Functional ability and status E.  Nutritional status F.  Physical activity G. Advance directives H. List of other physicians I.  Hospitalizations, surgeries, and ER visits in previous 12 months J.  [place] to include hearing, vision, cognitive, depression L. Referrals and appointments - none  In addition, I have reviewed and discussed with patient certain preventive protocols, quality metrics, and best practice recommendations. A written personalized care plan for preventive services as well as general preventive health recommendations were provided to patient.  See attached scanned questionnaire for additional information.   Signed,   Lindell Noe, MHA, BS, LPN Health Coach

## 2017-12-14 NOTE — Progress Notes (Signed)
BP 118/70 (BP Location: Left Arm, Patient Position: Sitting, Cuff Size: Normal)   Pulse 71   Temp 98.2 F (36.8 C) (Oral)   Ht 5\' 3"  (1.6 m)   Wt 192 lb 8 oz (87.3 kg)   SpO2 97%   BMI 34.10 kg/m    CC: CPE Subjective:    Patient ID: Alexandria Leach, female    DOB: 03-31-1950, 67 y.o.   MRN: [id number]  HPI: Alexandria Leach is a 67 y.o. female presenting on 12/15/2017 for Annual Exam (Pt 2.)   Saw Katha Cabal last week for medicare wellness visit. Note reviewed. Falls with injury in the past year.  Neuropathy is flaring up - bilateral legs with some swelling.   Preventative: COLONOSCOPY 07/2014; WNL Amedeo Plenty)  Breast cancer screening - birads1 mammogram 08/2017 Well woman exam - s/p hysterectomy, ovaries remain. Pelvic exam normal 2017.  Lung cancer screening - never smoker  DEXA T -1.1 hip, -0.2 spine 08/2015  Flu shot - declines  Tetanus shot - will check with insurance  Pneumovax 2014, prevnar today Shingrix - declines Advanced directive discussion - brought forms last week. Son Alexandria Leach is HCPOA. Clear she does not want CPR, no intubation. Has DNR form at home.  Seat belt use discussed Sunscreen use and skin screen discussed  Non smoker  Alcohol - none Dentist yearly Eye exam yearly  Lives with son, 1 dog Occupation: unemployed, on disability for fibromyalgia since 2008. Edu: HS Religion: Jehova's witness Activity: walks some Diet: good water, fruits/vegetables some  No caffeine use  Relevant past medical, surgical, family and social history reviewed and updated as indicated. Interim medical history since our last visit reviewed. Allergies and medications reviewed and updated. Outpatient Medications Prior to Visit  Medication Sig Dispense Refill  . ALPRAZolam (XANAX) 1 MG tablet Take 1 tablet (1 mg total) by mouth 3 (three) times daily as needed for anxiety. 270 tablet 1  . amitriptyline (ELAVIL) 25 MG tablet Take 1 tablet (25 mg total) by mouth at bedtime.  (Patient taking differently: Take 25 mg by mouth 3 times/day as needed-between meals & bedtime. ) 90 tablet 2  . amLODipine (NORVASC) 10 MG tablet TAKE 1 TABLET BY MOUTH  DAILY 90 tablet 0  . antiseptic oral rinse (BIOTENE) LIQD 15 mLs by Mouth Rinse route at bedtime.     Marland Kitchen aspirin 325 MG EC tablet Take 325 mg by mouth daily.     . cholecalciferol (VITAMIN D) 1000 UNITS tablet Take 1,000 Units by mouth daily.     . diclofenac sodium (VOLTAREN) 1 % GEL Apply 1 application topically 2 (two) times daily as needed (pain).    Marland Kitchen escitalopram (LEXAPRO) 20 MG tablet Take 1 tablet (20 mg total) by mouth 2 (two) times daily. 60 tablet 2  . ezetimibe (ZETIA) 10 MG tablet TAKE 1 TABLET BY MOUTH AT  BEDTIME 90 tablet 2  . famotidine (PEPCID) 20 MG tablet TAKE 1 TABLET BY MOUTH TWO  TIMES DAILY 180 tablet 2  . furosemide (LASIX) 20 MG tablet TAKE 1 TABLET BY MOUTH  DAILY AS NEEDED FOR EDEMA 90 tablet 1  . gabapentin (NEURONTIN) 300 MG capsule TAKE 2 CAPSULES EVERY  MORNING, 1 CAPSULE EVERY  AFTERNOON AND 2 CAPSULES AT BEDTIME. 450 capsule 1  . hydrochlorothiazide (MICROZIDE) 12.5 MG capsule TAKE 1 CAPSULE BY MOUTH  DAILY 90 capsule 3  . lovastatin (MEVACOR) 10 MG tablet TAKE 1 TABLET BY MOUTH  EVERY MONDAY, WEDNESDAY,  AND FRIDAY 39  tablet 0  . metoprolol succinate (TOPROL-XL) 25 MG 24 hr tablet Take 3 tablets (75 mg total) by mouth 2 (two) times daily. Take with or immediately following a meal 540 tablet 3  . Polyethyl Glycol-Propyl Glycol (SYSTANE) 0.4-0.3 % GEL Place 1 drop into both eyes 2 (two) times daily.     . RESTASIS 0.05 % ophthalmic emulsion INSTILL ONE DROP IN BOTH  EYES TWO TIMES DAILY 180 each 0  . traMADol (ULTRAM) 50 MG tablet TAKE 1 TABLET BY MOUTH 3  TIMES A DAY 90 tablet 0   No facility-administered medications prior to visit.      Per HPI unless specifically indicated in ROS section below Review of Systems  Constitutional: Negative for activity change, appetite change, chills, fatigue,  fever and unexpected weight change.  HENT: Negative for hearing loss.   Eyes: Negative for visual disturbance.  Respiratory: Negative for cough, chest tightness, shortness of breath and wheezing.   Cardiovascular: Positive for palpitations and leg swelling. Negative for chest pain.  Gastrointestinal: Negative for abdominal distention, abdominal pain, blood in stool, constipation, diarrhea, nausea and vomiting.  Genitourinary: Negative for difficulty urinating and hematuria.  Musculoskeletal: Negative for arthralgias, myalgias and neck pain.  Skin: Negative for rash.  Neurological: Negative for dizziness, seizures, syncope and headaches.  Hematological: Negative for adenopathy. Does not bruise/bleed easily.  Psychiatric/Behavioral: Negative for dysphoric mood. The patient is nervous/anxious.        Some agoraphobia       Objective:    BP 118/70 (BP Location: Left Arm, Patient Position: Sitting, Cuff Size: Normal)   Pulse 71   Temp 98.2 F (36.8 C) (Oral)   Ht 5\' 3"  (1.6 m)   Wt 192 lb 8 oz (87.3 kg)   SpO2 97%   BMI 34.10 kg/m   Wt Readings from Last 3 Encounters:  12/15/17 192 lb 8 oz (87.3 kg)  12/11/17 188 lb 12 oz (85.6 kg)  11/24/17 193 lb 3.2 oz (87.6 kg)    Physical Exam  Constitutional: She is oriented to person, place, and time. She appears well-developed and well-nourished. No distress.  HENT:  Head: Normocephalic and atraumatic.  Right Ear: Hearing, tympanic membrane, external ear and ear canal normal.  Left Ear: Hearing, tympanic membrane, external ear and ear canal normal.  Nose: Nose normal.  Mouth/Throat: Uvula is midline, oropharynx is clear and moist and mucous membranes are normal. No oropharyngeal exudate, posterior oropharyngeal edema or posterior oropharyngeal erythema.  Eyes: Pupils are equal, round, and reactive to light. Conjunctivae and EOM are normal. No scleral icterus.  Neck: Normal range of motion. Neck supple. Carotid bruit is not present. No  thyromegaly present.  Cardiovascular: Normal rate, regular rhythm, normal heart sounds and intact distal pulses.  No murmur heard. Pulses:      Radial pulses are 2+ on the right side, and 2+ on the left side.  Pulmonary/Chest: Effort normal and breath sounds normal. No respiratory distress. She has no wheezes. She has no rales.  Abdominal: Soft. Bowel sounds are normal. She exhibits no distension and no mass. There is no tenderness. There is no rebound and no guarding.  Musculoskeletal: Normal range of motion. She exhibits no edema.  Lymphadenopathy:    She has no cervical adenopathy.  Neurological: She is alert and oriented to person, place, and time.  CN grossly intact, station and gait intact  Skin: Skin is warm and dry. No rash noted.  Psychiatric: She has a normal mood and affect. Her behavior  is normal. Judgment and thought content normal.  Nursing note and vitals reviewed.  Results for orders placed or performed in visit on 12/11/17  TSH  Result Value Ref Range   TSH 2.72 0.35 - 4.50 uIU/mL  Lipid panel  Result Value Ref Range   Cholesterol 239 (H) 0 - 200 mg/dL   Triglycerides 93.0 0.0 - 149.0 mg/dL   HDL 62.20 >39.00 mg/dL   VLDL 18.6 0.0 - 40.0 mg/dL   LDL Cholesterol 158 (H) 0 - 99 mg/dL   Total CHOL/HDL Ratio 4    NonHDL 176.47   Renal function panel  Result Value Ref Range   Sodium 139 135 - 145 mEq/L   Potassium 3.9 3.5 - 5.1 mEq/L   Chloride 106 96 - 112 mEq/L   CO2 23 19 - 32 mEq/L   Calcium 9.8 8.4 - 10.5 mg/dL   Albumin 4.3 3.5 - 5.2 g/dL   BUN 9 6 - 23 mg/dL   Creatinine, Ser 1.18 0.40 - 1.20 mg/dL   Glucose, Bld 99 70 - 99 mg/dL   Phosphorus 3.1 2.3 - 4.6 mg/dL   GFR 59.05 (L) >60.00 mL/min      Assessment & Plan:   Problem List Items Addressed This Visit    Sjogren's syndrome ([place])    Appreciate rheum care.       Peripheral neuropathy    Sjogren vs idiopathic. Ongoing despite gabapentin current dosing - hesitant to increase dose due to  dizziness side effects. rec taking amitriptyline nightly and update with effect - again caution with anticholinergic effect.       Osteoarthritis   MDD (major depressive disorder), recurrent episode ([place])    Stable period followed by psychiatry.      HYPERCHOLESTEROLEMIA    Chronic, not well controlled despite zetia daily and lovastatin MWF. Trouble tolerating statins. rec start CoQ10.  The 10-year ASCVD risk score Mikey Bussing DC Brooke Bonito., et al., 2013) is: 8.6%   Values used to calculate the score:     Age: 34 years     Sex: Female     Is Non-Hispanic African American: Yes     Diabetic: No     Tobacco smoker: No     Systolic Blood Pressure: 341 mmHg     Is BP treated: Yes     HDL Cholesterol: 62.2 mg/dL     Total Cholesterol: 239 mg/dL       Health maintenance examination - Primary    Preventative protocols reviewed and updated unless pt declined. Discussed healthy diet and lifestyle.       Fibromyalgia   Essential hypertension    Chronic, stable. Continue current regimen.       DNR no code (do not resuscitate)   Diastolic CHF ([place])   CKD (chronic kidney disease) stage 3, GFR 30-59 ml/min ([facility])    Stable period.       Aortic stenosis    Possibly referred to carotids - but will check carotid US r/o stenosis.      Advanced care planning/counseling discussion    Advanced directive discussion - brought forms last week. Son Anett Ranker is HCPOA. Clear she does not want CPR, no intubation. Has DNR form at home.        Other Visit Diagnoses    Bilateral carotid bruits       Relevant Orders   VAS US CAROTID       Meds ordered this encounter  Medications  . Co-Enzyme Q-10 100 MG CAPS  Sig: Take 1 capsule (100 mg total) by mouth daily.    Refill:  0   No orders of the defined types were placed in this encounter.   Follow up plan: Return in about 6 months (around 06/15/2018) for follow up visit.  Ria Bush, MD

## 2017-12-15 ENCOUNTER — Ambulatory Visit (INDEPENDENT_AMBULATORY_CARE_PROVIDER_SITE_OTHER): Payer: Medicare Other | Admitting: Family Medicine

## 2017-12-15 ENCOUNTER — Encounter: Payer: Self-pay | Admitting: Family Medicine

## 2017-12-15 VITALS — BP 118/70 | HR 71 | Temp 98.2°F | Ht 63.0 in | Wt 192.5 lb

## 2017-12-15 DIAGNOSIS — N183 Chronic kidney disease, stage 3 unspecified: Secondary | ICD-10-CM

## 2017-12-15 DIAGNOSIS — F339 Major depressive disorder, recurrent, unspecified: Secondary | ICD-10-CM

## 2017-12-15 DIAGNOSIS — G6289 Other specified polyneuropathies: Secondary | ICD-10-CM

## 2017-12-15 DIAGNOSIS — Z7189 Other specified counseling: Secondary | ICD-10-CM

## 2017-12-15 DIAGNOSIS — Z0000 Encounter for general adult medical examination without abnormal findings: Secondary | ICD-10-CM

## 2017-12-15 DIAGNOSIS — R0989 Other specified symptoms and signs involving the circulatory and respiratory systems: Secondary | ICD-10-CM | POA: Diagnosis not present

## 2017-12-15 DIAGNOSIS — M3500 Sicca syndrome, unspecified: Secondary | ICD-10-CM | POA: Diagnosis not present

## 2017-12-15 DIAGNOSIS — Z66 Do not resuscitate: Secondary | ICD-10-CM

## 2017-12-15 DIAGNOSIS — E7800 Pure hypercholesterolemia, unspecified: Secondary | ICD-10-CM | POA: Diagnosis not present

## 2017-12-15 DIAGNOSIS — M797 Fibromyalgia: Secondary | ICD-10-CM

## 2017-12-15 DIAGNOSIS — I5030 Unspecified diastolic (congestive) heart failure: Secondary | ICD-10-CM

## 2017-12-15 DIAGNOSIS — I350 Nonrheumatic aortic (valve) stenosis: Secondary | ICD-10-CM

## 2017-12-15 DIAGNOSIS — Z23 Encounter for immunization: Secondary | ICD-10-CM | POA: Diagnosis not present

## 2017-12-15 DIAGNOSIS — I10 Essential (primary) hypertension: Secondary | ICD-10-CM

## 2017-12-15 DIAGNOSIS — M1612 Unilateral primary osteoarthritis, left hip: Secondary | ICD-10-CM

## 2017-12-15 MED ORDER — CO-ENZYME Q-10 100 MG PO CAPS: 1.0000 | ORAL_CAPSULE | Freq: Every day | ORAL | 0 refills | Status: DC

## 2017-12-15 NOTE — Assessment & Plan Note (Signed)
Possibly referred to carotids - but will check carotid US r/o stenosis.

## 2017-12-15 NOTE — Assessment & Plan Note (Signed)
Preventative protocols reviewed and updated unless pt declined. Discussed healthy diet and lifestyle.  

## 2017-12-15 NOTE — Addendum Note (Signed)
Addended by: Brenton Grills on: 5/53/7482 70:78 AM   Modules accepted: Orders

## 2017-12-15 NOTE — Assessment & Plan Note (Signed)
Stable period followed by psychiatry 

## 2017-12-15 NOTE — Assessment & Plan Note (Addendum)
Sjogren vs idiopathic. Ongoing despite gabapentin current dosing - hesitant to increase dose due to dizziness side effects. rec taking amitriptyline nightly and update with effect - again caution with anticholinergic effect.

## 2017-12-15 NOTE — Assessment & Plan Note (Signed)
Appreciate rheum care.

## 2017-12-15 NOTE — Assessment & Plan Note (Signed)
Stable period.  

## 2017-12-15 NOTE — Patient Instructions (Addendum)
prevnar today Try to eat one fruit or vegetable with each meal.  Start Co-enzyme Q10 daily over the counter to help with muscle cramping and pains. Continue zetia and lovastatin as up to now.  We will order carotid artery neck ultrasound.  You are doing well today Try taking amitriptyline every night - this may help neuropathy.  Return as needed or in 6 months for follow up visit.   Health Maintenance, Female Adopting a healthy lifestyle and getting preventive care can go a long way to promote health and wellness. Talk with your health care provider about what schedule of regular examinations is right for you. This is a good chance for you to check in with your provider about disease prevention and staying healthy. In between checkups, there are plenty of things you can do on your own. Experts have done a lot of research about which lifestyle changes and preventive measures are most likely to keep you healthy. Ask your health care provider for more information. Weight and diet Eat a healthy diet  Be sure to include plenty of vegetables, fruits, low-fat dairy products, and lean protein.  Do not eat a lot of foods high in solid fats, added sugars, or salt.  Get regular exercise. This is one of the most important things you can do for your health. ? Most adults should exercise for at least 150 minutes each week. The exercise should increase your heart rate and make you sweat (moderate-intensity exercise). ? Most adults should also do strengthening exercises at least twice a week. This is in addition to the moderate-intensity exercise.  Maintain a healthy weight  Body mass index (BMI) is a measurement that can be used to identify possible weight problems. It estimates body fat based on height and weight. Your health care provider can help determine your BMI and help you achieve or maintain a healthy weight.  For females 35 years of age and older: ? A BMI below 18.5 is considered  underweight. ? A BMI of 18.5 to 24.9 is normal. ? A BMI of 25 to 29.9 is considered overweight. ? A BMI of 30 and above is considered obese.  Watch levels of cholesterol and blood lipids  You should start having your blood tested for lipids and cholesterol at 65 years of age, then have this test every 5 years.  You may need to have your cholesterol levels checked more often if: ? Your lipid or cholesterol levels are high. ? You are older than 65 years of age. ? You are at high risk for heart disease.  Cancer screening Lung Cancer  Lung cancer screening is recommended for adults 74-67 years old who are at high risk for lung cancer because of a history of smoking.  A yearly low-dose CT scan of the lungs is recommended for people who: ? Currently smoke. ? Have quit within the past 15 years. ? Have at least a 30-pack-year history of smoking. A pack year is smoking an average of one pack of cigarettes a day for 1 year.  Yearly screening should continue until it has been 15 years since you quit.  Yearly screening should stop if you develop a health problem that would prevent you from having lung cancer treatment.  Breast Cancer  Practice breast self-awareness. This means understanding how your breasts normally appear and feel.  It also means doing regular breast self-exams. Let your health care provider know about any changes, no matter how small.  If you are in your  80s or 73s, you should have a clinical breast exam (CBE) by a health care provider every 1-3 years as part of a regular health exam.  If you are 4 or older, have a CBE every year. Also consider having a breast X-ray (mammogram) every year.  If you have a family history of breast cancer, talk to your health care provider about genetic screening.  If you are at high risk for breast cancer, talk to your health care provider about having an MRI and a mammogram every year.  Breast cancer gene (BRCA) assessment is  recommended for women who have family members with BRCA-related cancers. BRCA-related cancers include: ? Breast. ? Ovarian. ? Tubal. ? Peritoneal cancers.  Results of the assessment will determine the need for genetic counseling and BRCA1 and BRCA2 testing.  Cervical Cancer Your health care provider may recommend that you be screened regularly for cancer of the pelvic organs (ovaries, uterus, and vagina). This screening involves a pelvic examination, including checking for microscopic changes to the surface of your cervix (Pap test). You may be encouraged to have this screening done every 3 years, beginning at age 106.  For women ages 68-65, health care providers may recommend pelvic exams and Pap testing every 3 years, or they may recommend the Pap and pelvic exam, combined with testing for human papilloma virus (HPV), every 5 years. Some types of HPV increase your risk of cervical cancer. Testing for HPV may also be done on women of any age with unclear Pap test results.  Other health care providers may not recommend any screening for nonpregnant women who are considered low risk for pelvic cancer and who do not have symptoms. Ask your health care provider if a screening pelvic exam is right for you.  If you have had past treatment for cervical cancer or a condition that could lead to cancer, you need Pap tests and screening for cancer for at least 20 years after your treatment. If Pap tests have been discontinued, your risk factors (such as having a new sexual partner) need to be reassessed to determine if screening should resume. Some women have medical problems that increase the chance of getting cervical cancer. In these cases, your health care provider may recommend more frequent screening and Pap tests.  Colorectal Cancer  This type of cancer can be detected and often prevented.  Routine colorectal cancer screening usually begins at 65 years of age and continues through 65 years of  age.  Your health care provider may recommend screening at an earlier age if you have risk factors for colon cancer.  Your health care provider may also recommend using home test kits to check for hidden blood in the stool.  A small camera at the end of a tube can be used to examine your colon directly (sigmoidoscopy or colonoscopy). This is done to check for the earliest forms of colorectal cancer.  Routine screening usually begins at age 33.  Direct examination of the colon should be repeated every 5-10 years through 65 years of age. However, you may need to be screened more often if early forms of precancerous polyps or small growths are found.  Skin Cancer  Check your skin from head to toe regularly.  Tell your health care provider about any new moles or changes in moles, especially if there is a change in a mole's shape or color.  Also tell your health care provider if you have a mole that is larger than the size of  a pencil eraser.  Always use sunscreen. Apply sunscreen liberally and repeatedly throughout the day.  Protect yourself by wearing long sleeves, pants, a wide-brimmed hat, and sunglasses whenever you are outside.  Heart disease, diabetes, and high blood pressure  High blood pressure causes heart disease and increases the risk of stroke. High blood pressure is more likely to develop in: ? People who have blood pressure in the high end of the normal range (130-139/85-89 mm Hg). ? People who are overweight or obese. ? People who are African American.  If you are 45-2 years of age, have your blood pressure checked every 3-5 years. If you are 68 years of age or older, have your blood pressure checked every year. You should have your blood pressure measured twice-once when you are at a hospital or clinic, and once when you are not at a hospital or clinic. Record the average of the two measurements. To check your blood pressure when you are not at a hospital or clinic, you  can use: ? An automated blood pressure machine at a pharmacy. ? A home blood pressure monitor.  If you are between 65 years and 22 years old, ask your health care provider if you should take aspirin to prevent strokes.  Have regular diabetes screenings. This involves taking a blood sample to check your fasting blood sugar level. ? If you are at a normal weight and have a low risk for diabetes, have this test once every three years after 65 years of age. ? If you are overweight and have a high risk for diabetes, consider being tested at a younger age or more often. Preventing infection Hepatitis B  If you have a higher risk for hepatitis B, you should be screened for this virus. You are considered at high risk for hepatitis B if: ? You were born in a country where hepatitis B is common. Ask your health care provider which countries are considered high risk. ? Your parents were born in a high-risk country, and you have not been immunized against hepatitis B (hepatitis B vaccine). ? You have HIV or AIDS. ? You use needles to inject street drugs. ? You live with someone who has hepatitis B. ? You have had sex with someone who has hepatitis B. ? You get hemodialysis treatment. ? You take certain medicines for conditions, including cancer, organ transplantation, and autoimmune conditions.  Hepatitis C  Blood testing is recommended for: ? Everyone born from [birth year] through 1963. ? Anyone with known risk factors for hepatitis C.  Sexually transmitted infections (STIs)  You should be screened for sexually transmitted infections (STIs) including gonorrhea and chlamydia if: ? You are sexually active and are younger than 65 years of age. ? You are older than 65 years of age and your health care provider tells you that you are at risk for this type of infection. ? Your sexual activity has changed since you were last screened and you are at an increased risk for chlamydia or gonorrhea. Ask your  health care provider if you are at risk.  If you do not have HIV, but are at risk, it may be recommended that you take a prescription medicine daily to prevent HIV infection. This is called pre-exposure prophylaxis (PrEP). You are considered at risk if: ? You are sexually active and do not regularly use condoms or know the HIV status of your partner(s). ? You take drugs by injection. ? You are sexually active with a partner who has  HIV.  Talk with your health care provider about whether you are at high risk of being infected with HIV. If you choose to begin PrEP, you should first be tested for HIV. You should then be tested every 3 months for as long as you are taking PrEP. Pregnancy  If you are premenopausal and you may become pregnant, ask your health care provider about preconception counseling.  If you may become pregnant, take 400 to 800 micrograms (mcg) of folic acid every day.  If you want to prevent pregnancy, talk to your health care provider about birth control (contraception). Osteoporosis and menopause  Osteoporosis is a disease in which the bones lose minerals and strength with aging. This can result in serious bone fractures. Your risk for osteoporosis can be identified using a bone density scan.  If you are 19 years of age or older, or if you are at risk for osteoporosis and fractures, ask your health care provider if you should be screened.  Ask your health care provider whether you should take a calcium or vitamin D supplement to lower your risk for osteoporosis.  Menopause may have certain physical symptoms and risks.  Hormone replacement therapy may reduce some of these symptoms and risks. Talk to your health care provider about whether hormone replacement therapy is right for you. Follow these instructions at home:  Schedule regular health, dental, and eye exams.  Stay current with your immunizations.  Do not use any tobacco products including cigarettes, chewing  tobacco, or electronic cigarettes.  If you are pregnant, do not drink alcohol.  If you are breastfeeding, limit how much and how often you drink alcohol.  Limit alcohol intake to no more than 1 drink per day for nonpregnant women. One drink equals 12 ounces of beer, 5 ounces of wine, or 1 ounces of hard liquor.  Do not use street drugs.  Do not share needles.  Ask your health care provider for help if you need support or information about quitting drugs.  Tell your health care provider if you often feel depressed.  Tell your health care provider if you have ever been abused or do not feel safe at home. This information is not intended to replace advice given to you by your health care provider. Make sure you discuss any questions you have with your health care provider. Document Released: 09/17/2010 Document Revised: 08/10/2015 Document Reviewed: 12/06/2014 Elsevier Interactive Patient Education  [employer].

## 2017-12-15 NOTE — Assessment & Plan Note (Signed)
Advanced directive discussion - brought forms last week. Son Alexandria Leach is HCPOA. Clear she does not want CPR, no intubation. Has DNR form at home.

## 2017-12-15 NOTE — Assessment & Plan Note (Signed)
Chronic, stable. Continue current regimen. 

## 2017-12-15 NOTE — Assessment & Plan Note (Signed)
Chronic, not well controlled despite zetia daily and lovastatin MWF. Trouble tolerating statins. rec start CoQ10.  The 10-year ASCVD risk score Mikey Bussing DC Brooke Bonito., et al., 2013) is: 8.6%   Values used to calculate the score:     Age: 67 years     Sex: Female     Is Non-Hispanic African American: Yes     Diabetic: No     Tobacco smoker: No     Systolic Blood Pressure: 825 mmHg     Is BP treated: Yes     HDL Cholesterol: 62.2 mg/dL     Total Cholesterol: 239 mg/dL

## 2017-12-18 ENCOUNTER — Ambulatory Visit (HOSPITAL_COMMUNITY)
Admission: RE | Admit: 2017-12-18 | Discharge: 2017-12-18 | Disposition: A | Discharge status: Home / Self Care | Payer: Medicare Other | Source: Ambulatory Visit | Attending: Cardiology | Admitting: Cardiology

## 2017-12-18 DIAGNOSIS — R0989 Other specified symptoms and signs involving the circulatory and respiratory systems: Secondary | ICD-10-CM | POA: Diagnosis not present

## 2017-12-22 ENCOUNTER — Encounter: Payer: Self-pay | Admitting: Family Medicine

## 2017-12-22 DIAGNOSIS — I739 Peripheral vascular disease, unspecified: Secondary | ICD-10-CM | POA: Insufficient documentation

## 2017-12-29 ENCOUNTER — Ambulatory Visit (INDEPENDENT_AMBULATORY_CARE_PROVIDER_SITE_OTHER): Payer: Medicare Other | Admitting: Psychiatry

## 2017-12-29 ENCOUNTER — Encounter (HOSPITAL_COMMUNITY): Payer: Self-pay | Admitting: Psychiatry

## 2017-12-29 VITALS — BP 135/81 | HR 69 | Ht 63.0 in | Wt 193.0 lb

## 2017-12-29 DIAGNOSIS — F331 Major depressive disorder, recurrent, moderate: Secondary | ICD-10-CM | POA: Diagnosis not present

## 2017-12-29 MED ORDER — ESCITALOPRAM OXALATE 20 MG PO TABS: 20.0000 mg | ORAL_TABLET | Freq: Two times a day (BID) | ORAL | 2 refills | Status: DC

## 2017-12-29 MED ORDER — ALPRAZOLAM 1 MG PO TABS: 1.0000 mg | ORAL_TABLET | Freq: Three times a day (TID) | ORAL | 1 refills | Status: DC | PRN

## 2017-12-29 MED ORDER — AMITRIPTYLINE HCL 25 MG PO TABS: 25.0000 mg | ORAL_TABLET | Freq: Every day | ORAL | 2 refills | Status: DC
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
## 2018-01-22 ENCOUNTER — Other Ambulatory Visit: Payer: Self-pay

## 2018-01-22 NOTE — Patient Outreach (Signed)
Alexandria Leach  01/22/2018  Alexandria Leach 04-10-1950 [id number]      Medication Adherence call to [name] spoke with patient she just received a 90 days supply from [facility] mail order.[name] is showing past due under [name] Direct Dial [phone]  Fax [phone] [name]

## 2018-01-26 ENCOUNTER — Telehealth: Payer: Self-pay | Admitting: Cardiology

## 2018-01-26 ENCOUNTER — Other Ambulatory Visit (HOSPITAL_COMMUNITY): Payer: Self-pay | Admitting: Psychiatry

## 2018-01-26 NOTE — Telephone Encounter (Signed)
Returned call to patient she stated she has been having sob for appox 2 months.Stated she has gained 30 lbs in 2 months.Appointment scheduled with Jory Sims DNP 01/27/18 at 11:00 am.Advised ok to double Lasix dose ( 40 mg ) today.Advised to bring all medications to appointment.Advised to go to ED if needed.

## 2018-01-26 NOTE — Progress Notes (Signed)
Cardiology Office Note   Date:  01/27/2018   ID:  Alexandria Leach, DOB [birth year], MRN [id number]  PCP:  Ria Bush, MD  Cardiologist:  Stanford Breed   Chief Complaint  Patient presents with  . Shortness of Breath     History of Present Illness: Alexandria Leach is a 67 y.o. female who presents for ongoing assessment and management of mitral valve disease secondary to RA.She has MV repair in 3220, chronic diastolic CHF with grade II diastolic dysfunction. Last echo 2017 revealed normal systolic function, mild to moderate MR and moderate LAE. She continues on SBE prophylaxis.  Echo was repeated. Toprol was increased to 75 mg BID. She was continued on statin.   She states that her breathing status is worsened. She has been taking extra doses of lasix as she has some puffiness in her ankles.  Any exertional activity causes her to have severe dyspnea causing her to stop and rest     Past Medical History:  Diagnosis Date  . Ankle fracture, left 02/19/2015   from a fall  . Anxiety   . Cataract 2014   corrected with surgery  . CHF (congestive heart failure) ([place])   . CKD (chronic kidney disease) stage 3, GFR 30-59 ml/min [facility])    saw nephrologist Dr Harden Mo  . DDD (degenerative disc disease), cervical   . Depression   . Fibromyalgia   . Glaucoma    s/p surgery, sees ophtho Q6 mo  . 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 History of kidney stones 2010  . History of pulmonary embolism 2001, 2006   completed coumadin courses  . History of rheumatic fever x3  . HLD (hyperlipidemia)   . HTN (hypertension)   . Insomnia   . Lung nodule 09/22/2012   RLL - 88mm, stable since 2014. Thought benign.   . Osteoarthritis    shoulders and knees, not RA per Dr Estanislado Pandy, positive ANA, positive Ro  . Osteopenia 09/18/2015   DEXA T -1.1 hip, -0.2 spine 08/2015   . Personal history of urinary calculi latest 2014  . Pneumonia  12/02/2011  . PONV (postoperative nausea and vomiting)   . Refusal of blood transfusions as patient is Jehovah's Witness   . Rheumatic heart disease 1980   s/p mitral valve repair 1980  . Sjogren's syndrome [facility])     Past Surgical History:  Procedure Laterality Date  . BREAST BIOPSY Right 2006   benign  . CHOLECYSTECTOMY  11/27/2011   Procedure: LAPAROSCOPIC CHOLECYSTECTOMY WITH INTRAOPERATIVE CHOLANGIOGRAM;  Surgeon: Adin Hector, MD;  Location: [place];  Service: General;  Laterality: N/A;  laparoscopic cholecystectomy with choleangiogram umbilical hernia repair  . COLONOSCOPY  07/2014   WNL Amedeo Plenty)  . dexa  08/2012   normal per patient - no records available  . EYE SURGERY Bilateral 2014   cataract removal  . MITRAL VALVE REPAIR  1980   open heart  . ORIF ANKLE FRACTURE Left 02/26/2015   Procedure: OPEN REDUCTION INTERNAL FIXATION (ORIF) LEFT TRIMALLEOLAR ANKLE FRACTURE;  Surgeon: [name], MD;  Location: [place];  Service: Orthopedics;  Laterality: Left;  . TUBAL LIGATION  1980  . UMBILICAL HERNIA REPAIR  11/27/2011   Procedure: HERNIA REPAIR UMBILICAL ADULT;  Surgeon: Adin Hector, MD;  Location: [place];  Service: General;  Laterality: N/A;  . [place]   for fibroids -- partial, ovaries remain  Current Outpatient Medications  Medication Sig Dispense Refill  . ALPRAZolam (XANAX) 1 MG tablet Take 1 tablet (1 mg total) by mouth 3 (three) times daily as needed for anxiety. 270 tablet 1  . amitriptyline (ELAVIL) 25 MG tablet Take 1 tablet (25 mg total) by mouth at bedtime. 90 tablet 2  . amLODipine (NORVASC) 5 MG tablet Take 1 tablet (5 mg total) by mouth daily. 30 tablet 6  . antiseptic oral rinse (BIOTENE) LIQD 15 mLs by Mouth Rinse route at bedtime.     Marland Kitchen aspirin 325 MG EC tablet Take 325 mg by mouth daily.     . cholecalciferol (VITAMIN D) 1000 UNITS tablet Take 1,000 Units by mouth daily.     Marland Kitchen Co-Enzyme Q-10 100 MG CAPS Take 1 capsule (100 mg  total) by mouth daily.  0  . diclofenac sodium (VOLTAREN) 1 % GEL Apply 1 application topically 2 (two) times daily as needed (pain).    Marland Kitchen escitalopram (LEXAPRO) 20 MG tablet Take 1 tablet (20 mg total) by mouth 2 (two) times daily. 180 tablet 2  . ezetimibe (ZETIA) 10 MG tablet TAKE 1 TABLET BY MOUTH AT  BEDTIME 90 tablet 2  . famotidine (PEPCID) 20 MG tablet TAKE 1 TABLET BY MOUTH TWO  TIMES DAILY 180 tablet 2  . furosemide (LASIX) 20 MG tablet TAKE 1 TABLET BY MOUTH  DAILY AS NEEDED FOR EDEMA 90 tablet 1  . gabapentin (NEURONTIN) 300 MG capsule TAKE 2 CAPSULES EVERY  MORNING, 1 CAPSULE EVERY  AFTERNOON AND 2 CAPSULES AT BEDTIME. 450 capsule 1  . hydrochlorothiazide (MICROZIDE) 12.5 MG capsule TAKE 1 CAPSULE BY MOUTH  DAILY 90 capsule 3  . lovastatin (MEVACOR) 10 MG tablet TAKE 1 TABLET BY MOUTH  EVERY MONDAY, WEDNESDAY,  AND FRIDAY 39 tablet 0  . metoprolol succinate (TOPROL-XL) 100 MG 24 hr tablet Take 1 tablet (100 mg total) by mouth 2 (two) times daily. Take with or immediately following a meal 60 tablet 6  . Polyethyl Glycol-Propyl Glycol (SYSTANE) 0.4-0.3 % GEL Place 1 drop into both eyes 2 (two) times daily.     . RESTASIS 0.05 % ophthalmic emulsion INSTILL ONE DROP IN BOTH  EYES TWO TIMES DAILY 180 each 0  . traMADol (ULTRAM) 50 MG tablet TAKE 1 TABLET BY MOUTH 3  TIMES A DAY 90 tablet 0   No current facility-administered medications for this visit.     Allergies:   Cymbalta [duloxetine hcl]; Statins; and Sulfa antibiotics    Social History:  The patient  reports that she has never smoked. She has never used smokeless tobacco. She reports that she does not drink alcohol or use drugs.   Family History:  The patient's family history includes ALS in her mother; Alcohol abuse in her father; CAD in her mother; Cancer in her brother, maternal uncle, and mother; Depression in her sister; Diabetes in her brother, father, and sister; Heart attack in her brother; Kidney disease in her father;  Kidney failure in her other; Lupus in her sister; Stroke in her maternal grandmother and sister.    ROS: All other systems are reviewed and negative. Unless otherwise mentioned in H&P    PHYSICAL EXAM: VS:  BP 132/82   Pulse 98   Ht 5\' 3"  (1.6 m)   Wt 196 lb 6.4 oz (89.1 kg)   BMI 34.79 kg/m  , BMI Body mass index is 34.79 kg/m. GEN: Well nourished, well developed, in no acute distress HEENT: normal Neck: no JVD,  carotid bruits, or masses Cardiac: RRR; no murmurs, rubs, or gallops,no edema  Respiratory:  Clear to auscultation bilaterally, normal work of breathing GI: soft, nontender, nondistended, + BS MS: no deformity or atrophy Skin: warm and dry, no rash Neuro:  Strength and sensation are intact Psych: euthymic mood, full affect   EKG: NSR rate of 98 bpm, sinus tachycardia.   Recent Labs: 11/02/2017: ALT 11; B Natriuretic Peptide 166.0; Hemoglobin 12.0; Platelets 205 12/11/2017: BUN 9; Creatinine, Ser 1.18; Potassium 3.9; Sodium 139; TSH 2.72    Lipid Panel    Component Value Date/Time   CHOL 239 (H) 12/11/2017 1046   TRIG 93.0 12/11/2017 1046   HDL 62.20 12/11/2017 1046   CHOLHDL 4 12/11/2017 1046   VLDL 18.6 12/11/2017 1046   LDLCALC 158 (H) 12/11/2017 1046   LDLDIRECT 77.0 12/09/2016 0842      Wt Readings from Last 3 Encounters:  01/27/18 196 lb 6.4 oz (89.1 kg)  12/15/17 192 lb 8 oz (87.3 kg)  12/11/17 188 lb 12 oz (85.6 kg)      Other studies Reviewed:  Left ventricle: The cavity size was normal. There was mild focal   basal hypertrophy of the septum. Systolic function was vigorous.   The estimated ejection fraction was in the range of 65% to 70%.   Wall motion was normal; there were no regional wall motion   abnormalities. Features are consistent with a pseudonormal left   ventricular filling pattern, with concomitant abnormal relaxation   and increased filling pressure (grade 2 diastolic dysfunction). - Aortic valve: Trileaflet; mildly thickened,  mildly calcified   leaflets. There was very mild stenosis. There was mild   regurgitation. Peak velocity (S): 226 cm/s. Mean gradient (S): 12   mm Hg. - Mitral valve: Mildly thickened, mildly calcified leaflets . The   findings are consistent with moderate stenosis. There was mild   regurgitation. Mean gradient (D): 10 mm Hg. - Left atrium: The atrium was moderately dilated. - Tricuspid valve: There was trivial regurgitation. - Pulmonary arteries: Systolic pressure was mildly increased. PA   peak pressure: 40 mm Hg (S).  ASSESSMENT AND PLAN:  1. Dyspnea: Multifactorial. Heart rate is not well controlled. I am not sure she is taking metoprolol as directed. I will increase dose to metoprolol succinate 100 mg BID from 75 mg BID to assist in HR control which may be playing into her symptoms.  Consider sleep study on follow up. I will check TSH, CBC and BMET for evaluation for anemia, dehydration, or thyroid issues causing her symptoms.   2. Hypertension; She is on amlodipine 10 mg. I will reduce this to 5 mg as her BB dose is increased to avoid hypotension.   3. Mitral Valve disease: Moderate stenosis per echo If symptoms do not improve with HR control, may need to defer for evaluation for repair. Will discuss with primary cardiologist once I evaluate her on follow up.    Current medicines are reviewed at length with the patient today.    Labs/ tests ordered today include:TSH, BMET, CBC   Phill Myron. [place] Pugh, ANP, AACC   01/27/2018 12:19 PM    [place] [address] Office [phone] Fax [phone]

## 2018-01-26 NOTE — Telephone Encounter (Signed)
Pt c/o Shortness Of Breath: STAT if SOB developed within the last 24 hours or pt is noticeably SOB on the phone  1. Are you currently SOB (can you hear that pt is SOB on the phone)? Yes  2. How long have you been experiencing SOB? Two weeks  3. Are you SOB when sitting or when up moving around? Moving around  4.  Are you currently experiencing any other symptoms? Chest pain, but once she sits it goes away.   Pt c/o swelling: STAT is pt has developed SOB within 24 hours  1) How much weight have you gained and in what time span? 30lbs over 2 months  2) If swelling, where is the swelling located? Legs and feet  3) Are you currently taking a fluid pill?   furosemide (LASIX) 20 MG tablet  4) Are you currently SOB? yes  5) Do you have a log of your daily weights (if so, list)? no  6) Have you gained 3 pounds in a day or 5 pounds in a week? unsure  7) Have you traveled recently? no

## 2018-01-27 ENCOUNTER — Encounter: Payer: Self-pay | Admitting: Adult Health

## 2018-01-27 ENCOUNTER — Ambulatory Visit (INDEPENDENT_AMBULATORY_CARE_PROVIDER_SITE_OTHER): Payer: Medicare Other | Admitting: Adult Health

## 2018-01-27 VITALS — BP 132/82 | HR 98 | Ht 63.0 in | Wt 196.4 lb

## 2018-01-27 DIAGNOSIS — I342 Nonrheumatic mitral (valve) stenosis: Secondary | ICD-10-CM | POA: Diagnosis not present

## 2018-01-27 DIAGNOSIS — I359 Nonrheumatic aortic valve disorder, unspecified: Secondary | ICD-10-CM | POA: Diagnosis not present

## 2018-01-27 DIAGNOSIS — I340 Nonrheumatic mitral (valve) insufficiency: Secondary | ICD-10-CM

## 2018-01-27 DIAGNOSIS — I10 Essential (primary) hypertension: Secondary | ICD-10-CM | POA: Diagnosis not present

## 2018-01-27 DIAGNOSIS — I059 Rheumatic mitral valve disease, unspecified: Secondary | ICD-10-CM

## 2018-01-27 DIAGNOSIS — Z79899 Other long term (current) drug therapy: Secondary | ICD-10-CM

## 2018-01-27 MED ORDER — AMLODIPINE BESYLATE 5 MG PO TABS: 5.0000 mg | ORAL_TABLET | Freq: Every day | ORAL | 6 refills | Status: DC

## 2018-01-27 MED ORDER — METOPROLOL SUCCINATE ER 100 MG PO TB24: 100.0000 mg | ORAL_TABLET | Freq: Two times a day (BID) | ORAL | 6 refills | Status: DC

## 2018-01-27 NOTE — Patient Instructions (Signed)
Medication Instructions:  INCREASE METOPROLOL 100MG  TWICE DAILY  DECREASE AMLODIPINE 5MG  DAILY  If you need a refill on your cardiac medications before your next appointment, please call your pharmacy.  Labwork: CBC,BMET AND TSH TODAY HERE IN OUR OFFICE AT [facility]  Take the provided lab slips with you to the lab for your blood draw.   If you have labs (blood work) drawn today and your tests are completely normal, you will receive your results only by: Marland Kitchen MyChart Message (if you have MyChart) OR . A paper copy in the mail If you have any lab test that is abnormal or we need to change your treatment, we will call you to review the results.  Follow-Up: You will need a follow up appointment in 1 WEEK.   You may see  DR Orma Flaming, DNP, ANP or one of the following Advanced Practice Providers on your designated Care Team:    . Kerin Ransom, PA-C  At [facility], you and your health needs are our priority.  As part of our continuing mission to provide you with exceptional heart care, we have created designated Provider Care Teams.  These Care Teams include your primary Cardiologist (physician) and Advanced Practice Providers (APPs -  Physician Assistants and Nurse Practitioners) who all work together to provide you with the care you need, when you need it.  Thank you for choosing [facility] at [facility]!!

## 2018-01-28 LAB — CBC
HEMATOCRIT: 38 % (ref 34.0–46.6)
Hemoglobin: 12.5 g/dL (ref 11.1–15.9)
MCH: 27.8 pg (ref 26.6–33.0)
MCHC: 32.9 g/dL (ref 31.5–35.7)
MCV: 85 fL (ref 79–97)
Platelets: 282 10*3/uL (ref 150–450)
RBC: 4.49 x10E6/uL (ref 3.77–5.28)
RDW: 14.3 % (ref 12.3–15.4)
WBC: 5 10*3/uL (ref 3.4–10.8)

## 2018-01-28 LAB — BASIC METABOLIC PANEL
BUN / CREAT RATIO: 7 — AB (ref 12–28)
BUN: 7 mg/dL — ABNORMAL LOW (ref 8–27)
CO2: 25 mmol/L (ref 20–29)
CREATININE: 1.01 mg/dL — AB (ref 0.57–1.00)
Calcium: 9.9 mg/dL (ref 8.7–10.3)
Chloride: 102 mmol/L (ref 96–106)
GFR, EST AFRICAN AMERICAN: 68 mL/min/{1.73_m2} (ref >59)
GFR, EST NON AFRICAN AMERICAN: 59 mL/min/{1.73_m2} — AB (ref >59)
Glucose: 163 mg/dL — ABNORMAL HIGH (ref 65–99)
Potassium: 4.1 mmol/L (ref 3.5–5.2)
SODIUM: 141 mmol/L (ref 134–144)

## 2018-01-28 LAB — TSH: TSH: 4.99 u[IU]/mL — ABNORMAL HIGH (ref 0.450–4.500)

## 2018-01-29 NOTE — Progress Notes (Signed)
BP 122/82 (BP Location: Left Arm, Patient Position: Sitting, Cuff Size: Normal)   Pulse 88   Temp 98 F (36.7 C) (Oral)   Ht 5\' 3"  (1.6 m)   Wt 194 lb 12 oz (88.3 kg)   SpO2 98%   BMI 34.50 kg/m    CC: f/u abnormal TSH Subjective:    Patient ID: Alexandria Leach, female    DOB: 1950/06/17, 67 y.o.   MRN: [id number]  HPI: Alexandria Leach is a 67 y.o. female presenting on 01/30/2018 for Thyroid Check (Pt was seen by cardio on 01/28/18. Had blood work done, then was told to see PCP due to some abnormal thyroid results. Also, had elevated HR. )   Saw cardiology 01/27/2018, note reviewed. For dyspnea and tachycardia, metoprolol succinate was increased from 75mg  bid to 100mg  bid. Amlodipine was decreased to 5mg  daily.   Endorses unexpected weight gain (no significant change in last few months on our scales). + heat intolerance. No skin or hair changes. No constipation/diarrhea. Occasional difficulty with choking sensation when she starts eating. Known L lower thyroid lobe nodule 2cm.   Ongoing leg cramping despite coQ 10 100mg  daily.   She did recently have sinus infection 2 months ago. No other recent illness.   Thyroid US IMPRESSION: Left inferior thyroid nodule (labeled 2) meets criteria for surveillance, as designated by the newly established ACR TI-RADS criteria. Surveillance ultrasound study recommended to be performed annually up to 5 years Recommendations follow those established by the new ACR TI-RADS criteria (J Am Coll Radiol 2017;14:587-595) Electronically Signed By: Corrie Mckusick D.O. On: 09/25/2017 15:45  Relevant past medical, surgical, family and social history reviewed and updated as indicated. Interim medical history since our last visit reviewed. Allergies and medications reviewed and updated. Outpatient Medications Prior to Visit  Medication Sig Dispense Refill  . ALPRAZolam (XANAX) 1 MG tablet Take 1 tablet (1 mg total) by mouth 3 (three) times daily as  needed for anxiety. 270 tablet 1  . amitriptyline (ELAVIL) 25 MG tablet Take 1 tablet (25 mg total) by mouth at bedtime. 90 tablet 2  . amLODipine (NORVASC) 5 MG tablet Take 1 tablet (5 mg total) by mouth daily. 30 tablet 6  . antiseptic oral rinse (BIOTENE) LIQD 15 mLs by Mouth Rinse route at bedtime.     Marland Kitchen aspirin 325 MG EC tablet Take 325 mg by mouth daily.     . cholecalciferol (VITAMIN D) 1000 UNITS tablet Take 1,000 Units by mouth daily.     Marland Kitchen Co-Enzyme Q-10 100 MG CAPS Take 1 capsule (100 mg total) by mouth daily.  0  . diclofenac sodium (VOLTAREN) 1 % GEL Apply 1 application topically 2 (two) times daily as needed (pain).    Marland Kitchen escitalopram (LEXAPRO) 20 MG tablet Take 1 tablet (20 mg total) by mouth 2 (two) times daily. 180 tablet 2  . ezetimibe (ZETIA) 10 MG tablet TAKE 1 TABLET BY MOUTH AT  BEDTIME 90 tablet 2  . famotidine (PEPCID) 20 MG tablet TAKE 1 TABLET BY MOUTH TWO  TIMES DAILY 180 tablet 2  . furosemide (LASIX) 20 MG tablet TAKE 1 TABLET BY MOUTH  DAILY AS NEEDED FOR EDEMA 90 tablet 1  . gabapentin (NEURONTIN) 300 MG capsule TAKE 2 CAPSULES EVERY  MORNING, 1 CAPSULE EVERY  AFTERNOON AND 2 CAPSULES AT BEDTIME. 450 capsule 1  . hydrochlorothiazide (MICROZIDE) 12.5 MG capsule TAKE 1 CAPSULE BY MOUTH  DAILY 90 capsule 3  . lovastatin (MEVACOR) 10 MG tablet  TAKE 1 TABLET BY MOUTH  EVERY MONDAY, WEDNESDAY,  AND FRIDAY 39 tablet 0  . metoprolol succinate (TOPROL-XL) 100 MG 24 hr tablet Take 1 tablet (100 mg total) by mouth 2 (two) times daily. Take with or immediately following a meal 60 tablet 6  . Polyethyl Glycol-Propyl Glycol (SYSTANE) 0.4-0.3 % GEL Place 1 drop into both eyes 2 (two) times daily.     . RESTASIS 0.05 % ophthalmic emulsion INSTILL ONE DROP IN BOTH  EYES TWO TIMES DAILY 180 each 0  . traMADol (ULTRAM) 50 MG tablet TAKE 1 TABLET BY MOUTH 3  TIMES A DAY 90 tablet 0   No facility-administered medications prior to visit.      Per HPI unless specifically indicated in  ROS section below Review of Systems     Objective:    BP 122/82 (BP Location: Left Arm, Patient Position: Sitting, Cuff Size: Normal)   Pulse 88   Temp 98 F (36.7 C) (Oral)   Ht 5\' 3"  (1.6 m)   Wt 194 lb 12 oz (88.3 kg)   SpO2 98%   BMI 34.50 kg/m   Wt Readings from Last 3 Encounters:  01/30/18 194 lb 12 oz (88.3 kg)  01/27/18 196 lb 6.4 oz (89.1 kg)  12/15/17 192 lb 8 oz (87.3 kg)    Physical Exam  Constitutional: She appears well-developed and well-nourished. No distress.  HENT:  Mouth/Throat: Oropharynx is clear and moist. No oropharyngeal exudate.  Neck: Normal range of motion. Neck supple. Thyroid mass (known L sided nodule) present. No thyromegaly present.  Cardiovascular: Normal rate, regular rhythm and normal heart sounds.  No murmur heard. Pulmonary/Chest: Effort normal and breath sounds normal. No respiratory distress. She has no wheezes. She has no rales.  Nursing note and vitals reviewed.  Results for orders placed or performed in visit on 77/82/42  Basic metabolic panel  Result Value Ref Range   Glucose 163 (H) 65 - 99 mg/dL   BUN 7 (L) 8 - 27 mg/dL   Creatinine, Ser 1.01 (H) 0.57 - 1.00 mg/dL   GFR calc non Af Amer 59 (L) >59 mL/min/1.73   GFR calc Af Amer 68 >59 mL/min/1.73   BUN/Creatinine Ratio 7 (L) 12 - 28   Sodium 141 134 - 144 mmol/L   Potassium 4.1 3.5 - 5.2 mmol/L   Chloride 102 96 - 106 mmol/L   CO2 25 20 - 29 mmol/L   Calcium 9.9 8.7 - 10.3 mg/dL  TSH  Result Value Ref Range   TSH 4.990 (H) 0.450 - 4.500 uIU/mL  CBC  Result Value Ref Range   WBC 5.0 3.4 - 10.8 x10E3/uL   RBC 4.49 3.77 - 5.28 x10E6/uL   Hemoglobin 12.5 11.1 - 15.9 g/dL   Hematocrit 38.0 34.0 - 46.6 %   MCV 85 79 - 97 fL   MCH 27.8 26.6 - 33.0 pg   MCHC 32.9 31.5 - 35.7 g/dL   RDW 14.3 12.3 - 15.4 %   Platelets 282 150 - 450 x10E3/uL      Assessment & Plan:   Problem List Items Addressed This Visit    Thyroid nodule   Sjogren's syndrome (HCC)   Abnormal serum  thyroid stimulating hormone (TSH) level - Primary    Discussed possible hypothyroidism and symptoms/signs of this. Will update TFTs today. Known thyroid nodule, benign on [place] 09/2017. Discussed possible commencement of levothyroxine and/or endocrinology referral. Pt agrees with plan.       Relevant Orders   TSH  T4, free   T3       No orders of the defined types were placed in this encounter.  Orders Placed This Encounter  Procedures  . TSH  . T4, free  . T3    Follow up plan: No follow-ups on file.  Ria Bush, MD

## 2018-01-30 ENCOUNTER — Encounter: Payer: Self-pay | Admitting: Family Medicine

## 2018-01-30 ENCOUNTER — Ambulatory Visit (INDEPENDENT_AMBULATORY_CARE_PROVIDER_SITE_OTHER): Payer: Medicare Other | Admitting: Family Medicine

## 2018-01-30 VITALS — BP 122/82 | HR 88 | Temp 98.0°F | Ht 63.0 in | Wt 194.8 lb

## 2018-01-30 DIAGNOSIS — E041 Nontoxic single thyroid nodule: Secondary | ICD-10-CM

## 2018-01-30 DIAGNOSIS — R7989 Other specified abnormal findings of blood chemistry: Secondary | ICD-10-CM | POA: Diagnosis not present

## 2018-01-30 DIAGNOSIS — M3500 Sicca syndrome, unspecified: Secondary | ICD-10-CM

## 2018-01-30 LAB — T4, FREE: FREE T4: 0.66 ng/dL (ref 0.60–1.60)

## 2018-01-30 LAB — TSH: TSH: 1.63 u[IU]/mL (ref 0.35–4.50)

## 2018-01-30 NOTE — Patient Instructions (Signed)
Thyroid showed signs of being underactive. Unclear if your thyroid nodules are contributing. We will check labwork today - confirmatory thyroid hormone levels - and may discuss starting thyroid hormone and/or endocrinology referral.

## 2018-01-30 NOTE — Assessment & Plan Note (Signed)
Discussed possible hypothyroidism and symptoms/signs of this. Will update TFTs today. Known thyroid nodule, benign on [place] 09/2017. Discussed possible commencement of levothyroxine and/or endocrinology referral. Pt agrees with plan.

## 2018-01-31 LAB — T3: T3, Total: 141 ng/dL (ref 76–181)

## 2018-02-04 ENCOUNTER — Encounter: Payer: Self-pay | Admitting: Family Medicine

## 2018-02-05 ENCOUNTER — Encounter: Payer: Self-pay | Admitting: Adult Health

## 2018-02-05 ENCOUNTER — Ambulatory Visit (INDEPENDENT_AMBULATORY_CARE_PROVIDER_SITE_OTHER): Payer: Medicare Other | Admitting: Adult Health

## 2018-02-05 VITALS — BP 127/76 | HR 80 | Resp 16 | Ht 61.0 in | Wt 195.0 lb

## 2018-02-05 DIAGNOSIS — I10 Essential (primary) hypertension: Secondary | ICD-10-CM | POA: Diagnosis not present

## 2018-02-05 DIAGNOSIS — R000 Tachycardia, unspecified: Secondary | ICD-10-CM

## 2018-02-05 NOTE — Patient Instructions (Signed)
Follow-Up: You will need a follow up appointment in 6 MONTHS.  Please call our office 2 months in advance(MAR 2020) to schedule the (MAY 2020) appointment.  You may see  DR Orma Flaming, DNP, AACC -or- one of the following Advanced Practice Providers on your designated Care Team:    . Kerin Ransom, [place]  Medication Instructions:  NO CHANGES- Your physician recommends that you continue on your current medications as directed. Please refer to the Current Medication list given to you today.  If you need a refill on your cardiac medications before your next appointment, please call your pharmacy.  Labwork: If you have labs (blood work) drawn today and your tests are completely normal, you will receive your results ONLY by: . MyChart Message (if you have MyChart) -OR- . A paper copy in the mail  At [facility], you and your health needs are our priority.  As part of our continuing mission to provide you with exceptional heart care, we have created designated Provider Care Teams.  These Care Teams include your primary Cardiologist (physician) and Advanced Practice Providers (APPs -  Physician Assistants and Nurse Practitioners) who all work together to provide you with the care you need, when you need it.  Thank you for choosing [facility] at [facility]!!

## 2018-02-05 NOTE — Progress Notes (Signed)
Cardiology Office Note   Date:  02/05/2018   ID:  Alexandria Leach, DOB April 07, 1950, MRN [id number]  PCP:  Alexandria Bush, MD  Cardiologist: Dr. Stanford Leach  Chief Complaint  Patient presents with  . Mitral Valve Prolapse  . Shortness of Breath     History of Present Illness: Alexandria Leach is a 67 y.o. 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 Last echo 2017 revealed normal systolic function, mild to moderate MR and moderate LAE. She continues on SBE prophylaxis.  On last office visit she was complaining of worsening breathing status with puffiness in the ankles. Metoprolol was increased to 100 mg BID from 75 mg BID to assist with HR control which may have been contributing to worsening dyspnea. Due to increase in metoprolol amlodipine was decreased to 5 mg daily to avoid hypertension.   She comes today feeling better concerning her HR. She continues overall fatigue. She is being followed by PCP for fibromyalgia and thyroid studies.   Past Medical History:  Diagnosis Date  . Ankle fracture, left 02/19/2015   from a fall  . Anxiety   . Cataract 2014   corrected with surgery  . CHF (congestive heart failure) ([place])   . CKD (chronic kidney disease) stage 3, GFR 30-59 ml/min [facility])    saw nephrologist Dr Alexandria Leach  . DDD (degenerative disc disease), cervical   . Depression   . Fibromyalgia   . Glaucoma    s/p surgery, sees ophtho Q6 Leach  . 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 History of kidney stones 2010  . History of pulmonary embolism 2001, 2006   completed coumadin courses  . History of rheumatic fever x3  . HLD (hyperlipidemia)   . HTN (hypertension)   . Insomnia   . Lung nodule 09/22/2012   RLL - 62mm, stable since 2014. Thought benign.   . Osteoarthritis    shoulders  and knees, not RA per Dr Alexandria Leach, positive ANA, positive Ro  . Osteopenia 09/18/2015   DEXA T -1.1 hip, -0.2 spine 08/2015   . Personal history of urinary calculi latest 2014  . Pneumonia 12/02/2011  . PONV (postoperative nausea and vomiting)   . Refusal of blood transfusions as patient is Jehovah's Witness   . Rheumatic heart disease 1980   s/p mitral valve repair 1980  . Sjogren's syndrome [facility])     Past Surgical History:  Procedure Laterality Date  . BREAST BIOPSY Right 2006   benign  . CHOLECYSTECTOMY  11/27/2011   Procedure: LAPAROSCOPIC CHOLECYSTECTOMY WITH INTRAOPERATIVE CHOLANGIOGRAM;  Surgeon: Alexandria Hector, MD;  Location: [place];  Service: General;  Laterality: N/A;  laparoscopic cholecystectomy with choleangiogram umbilical hernia repair  . COLONOSCOPY  07/2014   WNL Alexandria Leach)  . dexa  08/2012   normal per patient - no records available  . EYE SURGERY Bilateral 2014   cataract removal  . MITRAL VALVE REPAIR  1980   open heart  . ORIF ANKLE FRACTURE Left 02/26/2015   Procedure: OPEN REDUCTION INTERNAL FIXATION (ORIF) LEFT TRIMALLEOLAR ANKLE FRACTURE;  Surgeon: Alexandria Koyanagi, MD;  Location: [place];  Service: Orthopedics;  Laterality: Left;  . TUBAL LIGATION  1980  . UMBILICAL HERNIA REPAIR  11/27/2011   Procedure: HERNIA REPAIR UMBILICAL ADULT;  Surgeon: Alexandria Hector, MD;  Location:  [place] OR;  Service: General;  Laterality: N/A;  . VAGINAL HYSTERECTOMY  1992   for fibroids -- partial, ovaries remain     Current Outpatient Medications  Medication Sig Dispense Refill  . ALPRAZolam (XANAX) 1 MG tablet Take 1 tablet (1 mg total) by mouth 3 (three) times daily as needed for anxiety. 270 tablet 1  . amitriptyline (ELAVIL) 25 MG tablet Take 1 tablet (25 mg total) by mouth at bedtime. 90 tablet 2  . amLODipine (NORVASC) 5 MG tablet Take 1 tablet (5 mg total) by mouth daily. 30 tablet 6  . antiseptic oral rinse (BIOTENE) LIQD 15 mLs by Mouth Rinse route at bedtime.     Marland Kitchen aspirin  325 MG EC tablet Take 325 mg by mouth daily.     . cholecalciferol (VITAMIN D) 1000 UNITS tablet Take 1,000 Units by mouth daily.     Marland Kitchen Co-Enzyme Q-10 100 MG CAPS Take 1 capsule (100 mg total) by mouth daily.  0  . diclofenac sodium (VOLTAREN) 1 % GEL Apply 1 application topically 2 (two) times daily as needed (pain).    Marland Kitchen escitalopram (LEXAPRO) 20 MG tablet Take 1 tablet (20 mg total) by mouth 2 (two) times daily. 180 tablet 2  . ezetimibe (ZETIA) 10 MG tablet TAKE 1 TABLET BY MOUTH AT  BEDTIME 90 tablet 2  . famotidine (PEPCID) 20 MG tablet TAKE 1 TABLET BY MOUTH TWO  TIMES DAILY 180 tablet 2  . furosemide (LASIX) 20 MG tablet TAKE 1 TABLET BY MOUTH  DAILY AS NEEDED FOR EDEMA 90 tablet 1  . gabapentin (NEURONTIN) 300 MG capsule TAKE 2 CAPSULES EVERY  MORNING, 1 CAPSULE EVERY  AFTERNOON AND 2 CAPSULES AT BEDTIME. 450 capsule 1  . hydrochlorothiazide (MICROZIDE) 12.5 MG capsule TAKE 1 CAPSULE BY MOUTH  DAILY 90 capsule 3  . lovastatin (MEVACOR) 10 MG tablet TAKE 1 TABLET BY MOUTH  EVERY MONDAY, WEDNESDAY,  AND FRIDAY 39 tablet 0  . metoprolol succinate (TOPROL-XL) 100 MG 24 hr tablet Take 1 tablet (100 mg total) by mouth 2 (two) times daily. Take with or immediately following a meal 60 tablet 6  . Polyethyl Glycol-Propyl Glycol (SYSTANE) 0.4-0.3 % GEL Place 1 drop into both eyes 2 (two) times daily.     . RESTASIS 0.05 % ophthalmic emulsion INSTILL ONE DROP IN BOTH  EYES TWO TIMES DAILY 180 each 0  . traMADol (ULTRAM) 50 MG tablet TAKE 1 TABLET BY MOUTH 3  TIMES A DAY 90 tablet 0   No current facility-administered medications for this visit.     Allergies:   Cymbalta [duloxetine hcl]; Statins; and Sulfa antibiotics    Social History:  The patient  reports that she has never smoked. She has never used smokeless tobacco. She reports that she does not drink alcohol or use drugs.   Family History:  The patient's family history includes ALS in her mother; Alcohol abuse in her father; CAD in her  mother; Cancer in her brother, maternal uncle, and mother; Depression in her sister; Diabetes in her brother, father, and sister; Heart attack in her brother; Kidney disease in her father; Kidney failure in her other; Lupus in her sister; Stroke in her maternal grandmother and sister.    ROS: All other systems are reviewed and negative. Unless otherwise mentioned in H&P    PHYSICAL EXAM: VS:  BP 127/76   Pulse 80   Resp 16   Ht 5\' 1"  (1.549 m)   Wt 195 lb (88.5 kg)  SpO2 100%   BMI 36.84 kg/m  , BMI Body mass index is 36.84 kg/m. GEN: Well nourished, well developed, in no acute distress HEENT: normal Neck: no JVD, carotid bruits, or masses Cardiac: RRR; no murmurs, rubs, or gallops,no edema  Respiratory:  Clear to auscultation bilaterally, normal work of breathing GI: soft, nontender, nondistended, + BS MS: no deformity or atrophy Skin: warm and dry, no rash Neuro:  Strength and sensation are intact Psych: euthymic mood, full affect   EKG:  Not completed this office visit.  Recent Labs: 11/02/2017: ALT 11; B Natriuretic Peptide 166.0 01/27/2018: BUN 7; Creatinine, Ser 1.01; Hemoglobin 12.5; Platelets 282; Potassium 4.1; Sodium 141 01/30/2018: TSH 1.63    Lipid Panel    Component Value Date/Time   CHOL 239 (H) 12/11/2017 1046   TRIG 93.0 12/11/2017 1046   HDL 62.20 12/11/2017 1046   CHOLHDL 4 12/11/2017 1046   VLDL 18.6 12/11/2017 1046   LDLCALC 158 (H) 12/11/2017 1046   LDLDIRECT 77.0 12/09/2016 0842      Wt Readings from Last 3 Encounters:  02/05/18 195 lb (88.5 kg)  01/30/18 194 lb 12 oz (88.3 kg)  01/27/18 196 lb 6.4 oz (89.1 kg)      Other studies Reviewed: Echocardiogram 2107-07-06 Left ventricle: The cavity size was normal. There was mild focal   basal hypertrophy of the septum. Systolic function was vigorous.   The estimated ejection fraction was in the range of 65% to 70%.   Wall motion was normal; there were no regional wall motion    abnormalities. Features are consistent with a pseudonormal left   ventricular filling pattern, with concomitant abnormal relaxation   and increased filling pressure (grade 2 diastolic dysfunction). - Aortic valve: Trileaflet; mildly thickened, mildly calcified   leaflets. There was very mild stenosis. There was mild   regurgitation. Peak velocity (S): 226 cm/s. Mean gradient (S): 12   mm Hg. - Mitral valve: Mildly thickened, mildly calcified leaflets . The   findings are consistent with moderate stenosis. There was mild   regurgitation. Mean gradient (D): 10 mm Hg. - Left atrium: The atrium was moderately dilated. - Tricuspid valve: There was trivial regurgitation. - Pulmonary arteries: Systolic pressure was mildly increased. PA   peak pressure: 40 mm Hg (S).  ASSESSMENT AND PLAN:  1. Tachycardia: HR is well controlled with metoprolol 100 mg BID.    2. Hypertension: BP is controlled with increase in BB and decrease in amlodipine dose.   3. Hypercholesterolemia: Continue statin therapy with Zetia.   4. Aortic and Mitral stenosis: Aortic stenosis is mild, moderate mitral stenosis per echo 05/2017 (hx of Mv repair in 1980.. Will follow annually unless she becomes symptomatic.   Current medicines are reviewed at length with the patient today.    Labs/ tests ordered today include: None  Phill Myron. [place] Pugh, ANP, AACC   02/05/2018 12:44 PM    [facility] [place] [address] Office [phone] Fax [phone]

## 2018-02-13 ENCOUNTER — Encounter (HOSPITAL_COMMUNITY): Payer: Self-pay

## 2018-02-13 ENCOUNTER — Ambulatory Visit (HOSPITAL_COMMUNITY)
Admission: EM | Admit: 2018-02-13 | Discharge: 2018-02-13 | Disposition: A | Discharge status: Home / Self Care | Payer: Medicare Other | Attending: Internal Medicine | Admitting: Internal Medicine

## 2018-02-13 DIAGNOSIS — E860 Dehydration: Secondary | ICD-10-CM

## 2018-02-13 LAB — POCT I-STAT, CHEM 8
BUN: 12 mg/dL (ref 8–23)
Calcium, Ion: 1.23 mmol/L (ref 1.15–1.40)
Chloride: 105 mmol/L (ref 98–111)
Creatinine, Ser: 1.2 mg/dL — ABNORMAL HIGH (ref 0.44–1.00)
Glucose, Bld: 123 mg/dL — ABNORMAL HIGH (ref 70–99)
HCT: 38 % (ref 36.0–46.0)
Hemoglobin: 12.9 g/dL (ref 12.0–15.0)
Potassium: 3.9 mmol/L (ref 3.5–5.1)
Sodium: 139 mmol/L (ref 135–145)
TCO2: 25 mmol/L (ref 22–32)

## 2018-02-13 LAB — POCT URINALYSIS DIP (DEVICE)
Glucose, UA: NEGATIVE mg/dL
Hgb urine dipstick: NEGATIVE
Ketones, ur: NEGATIVE mg/dL
NITRITE: NEGATIVE
Protein, ur: NEGATIVE mg/dL
Specific Gravity, Urine: >=1.03 (ref 1.005–1.030)
Urobilinogen, UA: 0.2 mg/dL (ref 0.0–1.0)
pH: 5.5 (ref 5.0–8.0)

## 2018-02-13 NOTE — ED Provider Notes (Signed)
[place]    CSN: [id number] Arrival date & time: 02/13/18  1551     History   Chief Complaint Chief Complaint  Patient presents with  . Urinary Tract Infection    HPI Alexandria Leach is a 67 y.o. female.   The patient with past medical history of Sjogren's syndrome, osteoarthritis, hypertension, hyperlipidemia and anxiety disorder presents to clinic concerned that she has not been able to urinate for the last few days.  She reports some discomfort when trying to pass urine but mostly fullness in her suprapubic area.  Patient states that she has been drinking plenty of water but is barely able to urinate.  She denies fever, nausea, vomiting or diarrhea.  She states that this is happened before and that her urologist told her she had kidney disease.  She admits to mild swelling in her feet and ankles but denies nondependent edema or rash.     Past Medical History:  Diagnosis Date  . Ankle fracture, left 02/19/2015   from a fall  . Anxiety   . Cataract 2014   corrected with surgery  . CHF (congestive heart failure) ([place])   . CKD (chronic kidney disease) stage 3, GFR 30-59 ml/min [facility])    saw nephrologist Dr Harden Mo  . DDD (degenerative disc disease), cervical   . Depression   . Fibromyalgia   . Glaucoma    s/p surgery, sees ophtho Q6 mo  . 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 History of kidney stones 2010  . History of pulmonary embolism 2001, 2006   completed coumadin courses  . History of rheumatic fever x3  . HLD (hyperlipidemia)   . HTN (hypertension)   . Insomnia   . Lung nodule 09/22/2012   RLL - 79mm, stable since 2014. Thought benign.   . Osteoarthritis    shoulders and knees, not RA per Dr Estanislado Pandy, positive ANA, positive Ro  . Osteopenia 09/18/2015   DEXA T -1.1 hip, -0.2 spine 08/2015   . Personal history of urinary calculi latest 2014  . Pneumonia 12/02/2011  . PONV  (postoperative nausea and vomiting)   . Refusal of blood transfusions as patient is Jehovah's Witness   . Rheumatic heart disease 1980   s/p mitral valve repair 1980  . Sjogren's syndrome [facility])     Patient Active Problem List   Diagnosis Date Noted  . Abnormal serum thyroid stimulating hormone (TSH) level 01/30/2018  . 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 Cough 11/06/2017  . Thyroid nodule 09/16/2017  . Sialoadenitis of submandibular gland 10/29/2016  . High risk medication use 06/05/2016  . Peripheral neuropathy 01/30/2016  . DNR no code (do not resuscitate) 12/08/2015  . 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 TIA (transient ischemic attack) 10/12/2015  . Abnormal urinalysis 10/12/2015  . Mild mitral regurgitation 10/12/2015  . Osteopenia 09/18/2015  . Right arm pain 08/29/2015  . History of fall   . Trimalleolar fracture of left ankle 02/23/2015  . Right sided sciatica 02/21/2015  . Imbalance 01/12/2015  . Transaminitis 12/24/2014  . DDD (degenerative disc disease), cervical   . Health maintenance examination 10/18/2014  . Advanced care planning/counseling discussion 10/18/2014  . Medicare annual wellness visit, initial 10/18/2014  . Refusal of blood transfusions as patient is Jehovah's Witness   . Sjogren's syndrome ([place])   . CKD (chronic kidney disease) stage 3, GFR 30-59 ml/min (HCC)   . Glaucoma   .  Fibromyalgia   . Osteoarthritis   . History of pulmonary embolism   . Lung nodule 09/22/2012  . Diastolic CHF ([place]) 72/53/6644  . Aortic stenosis 04/22/2011  . Palpitations 08/10/2010  . Dyspnea 08/10/2010  . HOT FLASHES 06/28/2008  . Trochanteric bursitis of right hip 06/28/2008  . ARTHRALGIA 07/01/2007  . BACK PAIN, LEFT 08/25/2006  . GAD (generalized anxiety disorder) 03/28/2006  . MITRAL VALVE REPLACEMENT, HX OF 03/28/2006  . TAH/BSO, HX OF 03/28/2006  . HYPERCHOLESTEROLEMIA 12/31/2005  . MDD (major depressive disorder), recurrent  episode ([place]) 12/31/2005  . RHEUMATIC HEART DISEASE 12/31/2005  . Essential hypertension 12/31/2005  . Chronic insomnia 12/31/2005    Past Surgical History:  Procedure Laterality Date  . BREAST BIOPSY Right 2006   benign  . CHOLECYSTECTOMY  11/27/2011   Procedure: LAPAROSCOPIC CHOLECYSTECTOMY WITH INTRAOPERATIVE CHOLANGIOGRAM;  Surgeon: Adin Hector, MD;  Location: [place];  Service: General;  Laterality: N/A;  laparoscopic cholecystectomy with choleangiogram umbilical hernia repair  . COLONOSCOPY  07/2014   [facility])  . dexa  08/2012   normal per patient - no records available  . EYE SURGERY Bilateral 2014   cataract removal  . MITRAL VALVE REPAIR  1980   open heart  . ORIF ANKLE FRACTURE Left 02/26/2015   Procedure: OPEN REDUCTION INTERNAL FIXATION (ORIF) LEFT TRIMALLEOLAR ANKLE FRACTURE;  Surgeon: [name], MD;  Location: [place];  Service: Orthopedics;  Laterality: Left;  . TUBAL LIGATION  1980  . UMBILICAL HERNIA REPAIR  11/27/2011   Procedure: HERNIA REPAIR UMBILICAL ADULT;  Surgeon: Adin Hector, MD;  Location: [place];  Service: General;  Laterality: N/A;  . [place]   for fibroids -- partial, ovaries remain    OB History   None      Home Medications    Prior to Admission medications   Medication Sig Start Date End Date Taking? Authorizing Provider  ALPRAZolam Duanne Moron) 1 MG tablet Take 1 tablet (1 mg total) by mouth 3 (three) times daily as needed for anxiety. 12/29/17 12/29/18  Cloria Spring, MD  amitriptyline (ELAVIL) 25 MG tablet Take 1 tablet (25 mg total) by mouth at bedtime. 12/29/17   Cloria Spring, MD  amLODipine (NORVASC) 5 MG tablet Take 1 tablet (5 mg total) by mouth daily. 01/27/18   Lendon Colonel, NP  antiseptic oral rinse (BIOTENE) LIQD 15 mLs by Mouth Rinse route at bedtime.     [provider]  aspirin 325 MG EC tablet Take 325 mg by mouth daily.     [provider]  cholecalciferol (VITAMIN D) 1000  UNITS tablet Take 1,000 Units by mouth daily.     [provider]  Co-Enzyme Q-10 100 MG CAPS Take 1 capsule (100 mg total) by mouth daily. 12/15/17   Ria Bush, MD  diclofenac sodium (VOLTAREN) 1 % GEL Apply 1 application topically 2 (two) times daily as needed (pain).    [provider]  escitalopram (LEXAPRO) 20 MG tablet Take 1 tablet (20 mg total) by mouth 2 (two) times daily. 12/29/17   Cloria Spring, MD  ezetimibe (ZETIA) 10 MG tablet TAKE 1 TABLET BY MOUTH AT  BEDTIME 02/20/17   Ria Bush, MD  famotidine (PEPCID) 20 MG tablet TAKE 1 TABLET BY MOUTH TWO  TIMES DAILY 04/30/17   Ria Bush, MD  furosemide (LASIX) 20 MG tablet TAKE 1 TABLET BY MOUTH  DAILY AS NEEDED FOR EDEMA 08/08/17   Ria Bush, MD  gabapentin (NEURONTIN) 300 MG capsule TAKE 2 CAPSULES EVERY  MORNING, 1 CAPSULE EVERY  AFTERNOON AND 2 CAPSULES AT BEDTIME. 11/13/17   Ria Bush, MD  hydrochlorothiazide (MICROZIDE) 12.5 MG capsule TAKE 1 CAPSULE BY MOUTH  DAILY 02/10/17   Ria Bush, MD  lovastatin (MEVACOR) 10 MG tablet TAKE 1 TABLET BY MOUTH  EVERY MONDAY, [place],  AND FRIDAY 11/13/17   Ria Bush, MD  metoprolol succinate (TOPROL-XL) 100 MG 24 hr tablet Take 1 tablet (100 mg total) by mouth 2 (two) times daily. Take with or immediately following a meal 01/27/18   Lendon Colonel, NP  Polyethyl Glycol-Propyl Glycol (SYSTANE) 0.4-0.3 % GEL Place 1 drop into both eyes 2 (two) times daily.     [provider]  RESTASIS 0.05 % ophthalmic emulsion INSTILL ONE DROP IN BOTH  EYES TWO TIMES DAILY 12/02/16   Ria Bush, MD  traMADol (ULTRAM) 50 MG tablet TAKE 1 TABLET BY MOUTH 3  TIMES A DAY 09/03/17   Bo Merino, MD    Family History Family History  Problem Relation Age of Onset  . Lupus Sister        and niece  . Cancer Mother        lung (nonsmoker)  . CAD Mother        MI in her 63s  . ALS Mother   . Kidney disease Father   .  Alcohol abuse Father   . Diabetes Father   . Cancer Brother        bone  . Diabetes Sister   . Stroke Sister   . Cancer Maternal Uncle        bone  . Depression Sister   . Kidney failure Other        on HD  . Diabetes Brother   . Heart attack Brother   . Stroke Maternal Grandmother     Social History Social History   Tobacco Use  . Smoking status: Never Smoker  . Smokeless tobacco: Never Used  Substance Use Topics  . Alcohol use: No    Alcohol/week: 0.0 standard drinks  . Drug use: No     Allergies   Cymbalta [duloxetine hcl]; Statins; and Sulfa antibiotics   Review of Systems Review of Systems  Constitutional: Negative for chills and fever.  HENT: Negative for sore throat and tinnitus.   Eyes: Negative for redness.  Respiratory: Negative for cough and shortness of breath.   Cardiovascular: Negative for chest pain and palpitations.  Gastrointestinal: Negative for abdominal pain, diarrhea, nausea and vomiting.  Genitourinary: Negative for dysuria, frequency and urgency.  Musculoskeletal: Negative for myalgias.  Skin: Negative for rash.       No lesions  Neurological: Negative for weakness.  Hematological: Does not bruise/bleed easily.  Psychiatric/Behavioral: Negative for suicidal ideas.     Physical Exam Triage Vital Signs ED Triage Vitals  Enc Vitals Group     BP 02/13/18 1708 135/74     Pulse Rate 02/13/18 1708 72     Resp 02/13/18 1708 18     Temp 02/13/18 1708 (!) 97.5 F (36.4 C)     Temp Source 02/13/18 1708 Oral     SpO2 02/13/18 1708 96 %     Weight --      Height --      Head Circumference --      Peak Flow --      Pain Score 02/13/18 1709 3     Pain Loc --  Pain Edu? --      Excl. in [place]? --    No data found.  Updated Vital Signs BP 135/74 (BP Location: Right Arm)   Pulse 72   Temp (!) 97.5 F (36.4 C) (Oral)   Resp 18   SpO2 96%   Visual Acuity Right Eye Distance:   Left Eye Distance:   Bilateral Distance:    Right  Eye Near:   Left Eye Near:    Bilateral Near:     Physical Exam  Constitutional: She is oriented to person, place, and time. She appears well-developed and well-nourished. No distress.  HENT:  Head: Normocephalic and atraumatic.  Mouth/Throat: Oropharynx is clear and moist.  Eyes: Pupils are equal, round, and reactive to light. Conjunctivae and EOM are normal. No scleral icterus.  Neck: Normal range of motion. Neck supple. No JVD present. No tracheal deviation present. No thyromegaly present.  Cardiovascular: Normal rate, regular rhythm and normal heart sounds. Exam reveals no gallop and no friction rub.  No murmur heard. Pulmonary/Chest: Effort normal and breath sounds normal.  Abdominal: Soft. Bowel sounds are normal. She exhibits no distension. There is no tenderness.  Musculoskeletal: Normal range of motion. She exhibits no edema.  Lymphadenopathy:    She has no cervical adenopathy.  Neurological: She is alert and oriented to person, place, and time. No cranial nerve deficit.  Skin: Skin is warm and dry.  Psychiatric: She has a normal mood and affect. Her behavior is normal. Judgment and thought content normal.  Nursing note and vitals reviewed.    UC Treatments / Results  Labs (all labs ordered are listed, but only abnormal results are displayed) Labs Reviewed  POCT URINALYSIS DIP (DEVICE) - Abnormal; Notable for the following components:      Result Value   Bilirubin Urine SMALL (*)    Leukocytes, UA TRACE (*)    All other components within normal limits    EKG None  Radiology No results found.  Procedures Procedures (including critical care time)  Medications Ordered in UC Medications - No data to display  Initial Impression / Assessment and Plan / UC Course  I have reviewed the triage vital signs and the nursing notes.  Pertinent labs & imaging results that were available during my care of the patient were reviewed by me and considered in my medical decision  making (see chart for details).     Review of BUN/creatinine results from the last 9 years in our system show some episodes of mild acute kidney injury but no significant episodes concerning for nephrotic syndrome.  Obtain Chem-8 to assess current kidney function.  Check UA to rule out UTI.   Creatinine mildly elevated.  Urinalysis shows high specific gravity.  Patient clinically fairly well-hydrated his mucous membranes are moist skin is normal.  Have explained to her that she just simply needs to increase her water intake.  I also informed her of her mildly elevated glucose of which she says she is aware and she will be following with her primary care doctor.  Final Clinical Impressions(s) / UC Diagnoses   Final diagnoses:  None   Discharge Instructions   None    ED Prescriptions    None     Controlled Substance Prescriptions [place] Controlled Substance Registry consulted? Not Applicable   Harrie Foreman, MD 02/13/18 [phone]

## 2018-02-13 NOTE — ED Triage Notes (Signed)
Pt presents with diffiulty urinating and discomfort.

## 2018-02-17 ENCOUNTER — Encounter: Payer: Self-pay | Admitting: Sports Medicine

## 2018-02-17 ENCOUNTER — Ambulatory Visit (INDEPENDENT_AMBULATORY_CARE_PROVIDER_SITE_OTHER): Payer: Medicare Other | Admitting: Sports Medicine

## 2018-02-17 VITALS — BP 137/81 | HR 88 | Resp 16

## 2018-02-17 DIAGNOSIS — M79676 Pain in unspecified toe(s): Secondary | ICD-10-CM

## 2018-02-17 DIAGNOSIS — B351 Tinea unguium: Secondary | ICD-10-CM | POA: Diagnosis not present

## 2018-02-17 DIAGNOSIS — I739 Peripheral vascular disease, unspecified: Secondary | ICD-10-CM | POA: Diagnosis not present

## 2018-02-17 NOTE — Progress Notes (Signed)
Subjective: Alexandria Leach is a 67 y.o. female patient seen today in office with complaint of mildly painful thickened and elongated toenails; unable to trim. Patient denies history of Diabetes, Neuropathy, or Vascular disease but does admits to history of ankle fracture on left with occasional swelling and takes Aspirin. Patient has no other pedal complaints at this time.   Patient Active Problem List   Diagnosis Date Noted  . Abnormal serum thyroid stimulating hormone (TSH) level 01/30/2018  . 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[place]) 12/22/2017  . Cough 11/06/2017  . Thyroid nodule 09/16/2017  . Sialoadenitis of submandibular gland 10/29/2016  . High risk medication use 06/05/2016  . Peripheral neuropathy 01/30/2016  . DNR no code (do not resuscitate) 12/08/2015  . 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 TIA (transient ischemic attack) 10/12/2015  . Abnormal urinalysis 10/12/2015  . Mild mitral regurgitation 10/12/2015  . Osteopenia 09/18/2015  . Right arm pain 08/29/2015  . History of fall   . Trimalleolar fracture of left ankle 02/23/2015  . Right sided sciatica 02/21/2015  . Imbalance 01/12/2015  . Transaminitis 12/24/2014  . DDD (degenerative disc disease), cervical   . Health maintenance examination 10/18/2014  . Advanced care planning/counseling discussion 10/18/2014  . Medicare annual wellness visit, initial 10/18/2014  . Refusal of blood transfusions as patient is Jehovah's Witness   . Sjogren's syndrome ([place])   . CKD (chronic kidney disease) stage 3, GFR 30-59 ml/min (HCC)   . Glaucoma   . Fibromyalgia   . Osteoarthritis   . History of pulmonary embolism   . Lung nodule 09/22/2012  . Diastolic CHF ([place]) 63/87/5643  . Aortic stenosis 04/22/2011  . Palpitations 08/10/2010  . Dyspnea 08/10/2010  . HOT FLASHES 06/28/2008  . Trochanteric bursitis of right hip 06/28/2008  . ARTHRALGIA 07/01/2007  . BACK PAIN, LEFT 08/25/2006  . GAD (generalized anxiety  disorder) 03/28/2006  . MITRAL VALVE REPLACEMENT, HX OF 03/28/2006  . TAH/BSO, HX OF 03/28/2006  . HYPERCHOLESTEROLEMIA 12/31/2005  . MDD (major depressive disorder), recurrent episode ([place]) 12/31/2005  . RHEUMATIC HEART DISEASE 12/31/2005  . Essential hypertension 12/31/2005  . Chronic insomnia 12/31/2005    Current Outpatient Medications on File Prior to Visit  Medication Sig Dispense Refill  . ALPRAZolam (XANAX) 1 MG tablet Take 1 tablet (1 mg total) by mouth 3 (three) times daily as needed for anxiety. 270 tablet 1  . amitriptyline (ELAVIL) 25 MG tablet Take 1 tablet (25 mg total) by mouth at bedtime. 90 tablet 2  . amLODipine (NORVASC) 5 MG tablet Take 1 tablet (5 mg total) by mouth daily. 30 tablet 6  . antiseptic oral rinse (BIOTENE) LIQD 15 mLs by Mouth Rinse route at bedtime.     Marland Kitchen aspirin 325 MG EC tablet Take 325 mg by mouth daily.     . cholecalciferol (VITAMIN D) 1000 UNITS tablet Take 1,000 Units by mouth daily.     Marland Kitchen Co-Enzyme Q-10 100 MG CAPS Take 1 capsule (100 mg total) by mouth daily.  0  . diclofenac sodium (VOLTAREN) 1 % GEL Apply 1 application topically 2 (two) times daily as needed (pain).    Marland Kitchen escitalopram (LEXAPRO) 20 MG tablet Take 1 tablet (20 mg total) by mouth 2 (two) times daily. 180 tablet 2  . ezetimibe (ZETIA) 10 MG tablet TAKE 1 TABLET BY MOUTH AT  BEDTIME 90 tablet 2  . famotidine (PEPCID) 20 MG tablet TAKE 1 TABLET BY MOUTH TWO  TIMES DAILY 180 tablet 2  . furosemide (  LASIX) 20 MG tablet TAKE 1 TABLET BY MOUTH  DAILY AS NEEDED FOR EDEMA 90 tablet 1  . gabapentin (NEURONTIN) 300 MG capsule TAKE 2 CAPSULES EVERY  MORNING, 1 CAPSULE EVERY  AFTERNOON AND 2 CAPSULES AT BEDTIME. 450 capsule 1  . hydrochlorothiazide (MICROZIDE) 12.5 MG capsule TAKE 1 CAPSULE BY MOUTH  DAILY 90 capsule 3  . lovastatin (MEVACOR) 10 MG tablet TAKE 1 TABLET BY MOUTH  EVERY MONDAY, WEDNESDAY,  AND FRIDAY 39 tablet 0  . metoprolol succinate (TOPROL-XL) 100 MG 24 hr tablet Take 1  tablet (100 mg total) by mouth 2 (two) times daily. Take with or immediately following a meal 60 tablet 6  . Polyethyl Glycol-Propyl Glycol (SYSTANE) 0.4-0.3 % GEL Place 1 drop into both eyes 2 (two) times daily.     . RESTASIS 0.05 % ophthalmic emulsion INSTILL ONE DROP IN BOTH  EYES TWO TIMES DAILY 180 each 0  . traMADol (ULTRAM) 50 MG tablet TAKE 1 TABLET BY MOUTH 3  TIMES A DAY 90 tablet 0   No current facility-administered medications on file prior to visit.     Allergies  Allergen Reactions  . Cymbalta [Duloxetine Hcl] Other (See Comments)    tachycardia  . Statins Nausea Only and Other (See Comments)    Muscle cramps also  . Sulfa Antibiotics Nausea And Vomiting    Objective: Physical Exam  General: Well developed, nourished, no acute distress, awake, alert and oriented x 3  Vascular: Dorsalis pedis artery 2/4 bilateral, Posterior tibial artery 1/4 bilateral, skin temperature warm to warm proximal to distal bilateral lower extremities, +1 edema L>R, no varicosities, pedal hair present bilateral.  Neurological: Gross sensation present via light touch bilateral.   Dermatological: Skin is warm, dry, and supple bilateral, Nails 1-10 are tender, long, thick, and discolored with mild subungal debris, no webspace macerations present bilateral, no open lesions present bilateral, no callus/corns/hyperkeratotic tissue present bilateral. No signs of infection bilateral.  Musculoskeletal: Asymptomatic pes planus boney deformities noted bilateral. Muscular strength within normal limits without painon range of motion. No pain with calf compression bilateral.  Assessment and Plan:  Problem List Items Addressed This Visit    None    Visit Diagnoses    Pain due to onychomycosis of toenail    -  Primary   PVD (peripheral vascular disease) ([place])          -Examined patient.  -Discussed treatment options for painful mycotic nails. -Mechanically debrided and reduced mycotic nails with  sterile nail nipper and dremel nail file without incident. -Patient to return in 3 months for follow up evaluation or sooner if symptoms worsen.  Landis Martins, DPM

## 2018-03-04 ENCOUNTER — Other Ambulatory Visit: Payer: Self-pay

## 2018-03-04 NOTE — Patient Outreach (Signed)
[facility]) Care Management  03/04/2018  Alexandria Leach 07-09-1950 [id number]   Medication Adherence call to [name] spoke with patient she is due on Lovastatin 10 mg she received a mail order from [facility]  in Nov/2019 patient wont be due until Jan/2020. [name] is showing past due under [employer] [place].   [place] Management Direct Dial [phone]  Fax [phone] [email or web address]

## 2018-03-14 DIAGNOSIS — I359 Nonrheumatic aortic valve disorder, unspecified: Secondary | ICD-10-CM

## 2018-03-14 DIAGNOSIS — I059 Rheumatic mitral valve disease, unspecified: Secondary | ICD-10-CM

## 2018-03-16 MED ORDER — METOPROLOL SUCCINATE ER 100 MG PO TB24: 100.0000 mg | ORAL_TABLET | Freq: Two times a day (BID) | ORAL | 3 refills | Status: DC

## 2018-03-16 MED ORDER — AMLODIPINE BESYLATE 5 MG PO TABS: 5.0000 mg | ORAL_TABLET | Freq: Every day | ORAL | 3 refills | Status: DC

## 2018-03-17 ENCOUNTER — Other Ambulatory Visit: Payer: Self-pay

## 2018-03-17 DIAGNOSIS — I359 Nonrheumatic aortic valve disorder, unspecified: Secondary | ICD-10-CM

## 2018-03-17 DIAGNOSIS — I059 Rheumatic mitral valve disease, unspecified: Secondary | ICD-10-CM

## 2018-03-17 MED ORDER — METOPROLOL SUCCINATE ER 100 MG PO TB24: 100.0000 mg | ORAL_TABLET | Freq: Two times a day (BID) | ORAL | 2 refills | Status: DC

## 2018-03-17 MED ORDER — AMLODIPINE BESYLATE 5 MG PO TABS: 5.0000 mg | ORAL_TABLET | Freq: Every day | ORAL | 3 refills | Status: DC

## 2018-04-03 ENCOUNTER — Ambulatory Visit (INDEPENDENT_AMBULATORY_CARE_PROVIDER_SITE_OTHER): Payer: Medicare Other | Admitting: Family Medicine

## 2018-04-03 ENCOUNTER — Encounter: Payer: Self-pay | Admitting: Family Medicine

## 2018-04-03 VITALS — BP 136/78 | HR 102 | Temp 98.0°F | Ht 63.0 in | Wt 187.0 lb

## 2018-04-03 DIAGNOSIS — G459 Transient cerebral ischemic attack, unspecified: Secondary | ICD-10-CM | POA: Diagnosis not present

## 2018-04-03 DIAGNOSIS — R404 Transient alteration of awareness: Secondary | ICD-10-CM

## 2018-04-03 DIAGNOSIS — G6289 Other specified polyneuropathies: Secondary | ICD-10-CM

## 2018-04-03 LAB — CBC WITH DIFFERENTIAL/PLATELET
Basophils Absolute: 0 10*3/uL (ref 0.0–0.1)
Basophils Relative: 0.8 % (ref 0.0–3.0)
EOS PCT: 1.3 % (ref 0.0–5.0)
Eosinophils Absolute: 0.1 10*3/uL (ref 0.0–0.7)
HCT: 42.2 % (ref 36.0–46.0)
Hemoglobin: 13.9 g/dL (ref 12.0–15.0)
Lymphocytes Relative: 24.7 % (ref 12.0–46.0)
Lymphs Abs: 1.5 10*3/uL (ref 0.7–4.0)
MCHC: 32.9 g/dL (ref 30.0–36.0)
MCV: 83.7 fl (ref 78.0–100.0)
Monocytes Absolute: 0.6 10*3/uL (ref 0.1–1.0)
Monocytes Relative: 10.4 % (ref 3.0–12.0)
NEUTROS ABS: 3.7 10*3/uL (ref 1.4–7.7)
Neutrophils Relative %: 62.8 % (ref 43.0–77.0)
Platelets: 263 10*3/uL (ref 150.0–400.0)
RBC: 5.04 Mil/uL (ref 3.87–5.11)
RDW: 14.3 % (ref 11.5–15.5)
WBC: 5.9 10*3/uL (ref 4.0–10.5)

## 2018-04-03 LAB — POC URINALSYSI DIPSTICK (AUTOMATED)
Bilirubin, UA: NEGATIVE
Glucose, UA: NEGATIVE
Nitrite, UA: NEGATIVE
Protein, UA: NEGATIVE
SPEC GRAV UA: 1.015 (ref 1.010–1.025)
Urobilinogen, UA: 2 E.U./dL — AB
pH, UA: 6.5 (ref 5.0–8.0)

## 2018-04-03 LAB — COMPREHENSIVE METABOLIC PANEL
ALT: 28 U/L (ref 0–35)
AST: 43 U/L — AB (ref 0–37)
Albumin: 4.5 g/dL (ref 3.5–5.2)
Alkaline Phosphatase: 86 U/L (ref 39–117)
BUN: 8 mg/dL (ref 6–23)
CO2: 22 mEq/L (ref 19–32)
CREATININE: 1.09 mg/dL (ref 0.40–1.20)
Calcium: 10.3 mg/dL (ref 8.4–10.5)
Chloride: 102 mEq/L (ref 96–112)
GFR: 60.83 mL/min (ref >60.00)
Glucose, Bld: 108 mg/dL — ABNORMAL HIGH (ref 70–99)
Potassium: 4 mEq/L (ref 3.5–5.1)
SODIUM: 137 meq/L (ref 135–145)
Total Bilirubin: 0.7 mg/dL (ref 0.2–1.2)
Total Protein: 8.6 g/dL — ABNORMAL HIGH (ref 6.0–8.3)

## 2018-04-03 LAB — T4, FREE: Free T4: 0.91 ng/dL (ref 0.60–1.60)

## 2018-04-03 LAB — TSH: TSH: 3.55 u[IU]/mL (ref 0.35–4.50)

## 2018-04-03 MED ORDER — GABAPENTIN 300 MG PO CAPS: ORAL_CAPSULE | ORAL | 1 refills | Status: DC

## 2018-04-03 NOTE — Patient Instructions (Addendum)
Urine culture sent today. Labs today.  This could be med related, this could be urine infection.  Try lower dose of gabapentin - we will try slow taper down. Check with other doctors about dosing of lexapro, amitriptyline, xanax.  Restart other medicines, watching blood pressures to ensure not too low.  Let me know how you're doing.

## 2018-04-03 NOTE — Progress Notes (Signed)
BP 136/78 (BP Location: Left Arm, Patient Position: Sitting, Cuff Size: Large)   Pulse (!) 102   Temp 98 F (36.7 C) (Oral)   Ht 5\' 3"  (1.6 m)   Wt 187 lb (84.8 kg)   SpO2 97%   BMI 33.13 kg/m   No data found.  CC: AMS, dizziness Subjective:    Patient ID: Alexandria Leach, female    DOB: [birth year], 68 y.o.   MRN: [id number]  HPI: Alexandria Leach is a 68 y.o. female presenting on 04/03/2018 for Memory Loss (Per pt's husband [name], pt has been "talking out of her head". Says it started the night of 03/29/17 and continues. Pt accompanied by her husband, [name]. ) and Dizziness (C/o dizzy spells. Started 03/29/17. )   5 days ago had episode of confusion according to husband. "Talking out of head" things that did not make sense, did not know what day it was, . Son came by and took all her medications because he was worried she was taking too many medicines. She has been off medications for the last 3 days. Husband endorses slurred speech during AMS, but pt denies hemiparesis, paresthesias, vision changes.   Some increased urinary urgency and frequency at the beginning of the week. Some L flank pain on Sunday - no rash. She felt feverish on Monday. No dysuria, nausea/vomiting, flank pain.   She has had some headaches over the last few days.  No appetite recently.      Relevant past medical, surgical, family and social history reviewed and updated as indicated. Interim medical history since our last visit reviewed. Allergies and medications reviewed and updated. Outpatient Medications Prior to Visit  Medication Sig Dispense Refill  . ALPRAZolam (XANAX) 1 MG tablet Take 1 tablet (1 mg total) by mouth 3 (three) times daily as needed for anxiety. 270 tablet 1  . amitriptyline (ELAVIL) 25 MG tablet Take 1 tablet (25 mg total) by mouth at bedtime. 90 tablet 2  . amLODipine (NORVASC) 5 MG tablet Take 1 tablet (5 mg total) by mouth daily. 90 tablet 3  . antiseptic oral rinse (BIOTENE) LIQD 15  mLs by Mouth Rinse route at bedtime.     Marland Kitchen aspirin 325 MG EC tablet Take 325 mg by mouth daily.     . cholecalciferol (VITAMIN D) 1000 UNITS tablet Take 1,000 Units by mouth daily.     Marland Kitchen Co-Enzyme Q-10 100 MG CAPS Take 1 capsule (100 mg total) by mouth daily.  0  . diclofenac sodium (VOLTAREN) 1 % GEL Apply 1 application topically 2 (two) times daily as needed (pain).    Marland Kitchen escitalopram (LEXAPRO) 20 MG tablet Take 1 tablet (20 mg total) by mouth 2 (two) times daily. 180 tablet 2  . ezetimibe (ZETIA) 10 MG tablet TAKE 1 TABLET BY MOUTH AT  BEDTIME 90 tablet 2  . famotidine (PEPCID) 20 MG tablet TAKE 1 TABLET BY MOUTH TWO  TIMES DAILY 180 tablet 2  . furosemide (LASIX) 20 MG tablet TAKE 1 TABLET BY MOUTH  DAILY AS NEEDED FOR EDEMA 90 tablet 1  . hydrochlorothiazide (MICROZIDE) 12.5 MG capsule TAKE 1 CAPSULE BY MOUTH  DAILY 90 capsule 3  . lovastatin (MEVACOR) 10 MG tablet TAKE 1 TABLET BY MOUTH  EVERY MONDAY, WEDNESDAY,  AND FRIDAY 39 tablet 0  . metoprolol succinate (TOPROL-XL) 100 MG 24 hr tablet Take 1 tablet (100 mg total) by mouth 2 (two) times daily. Take with or immediately following a meal 180 tablet 2  .  Polyethyl Glycol-Propyl Glycol (SYSTANE) 0.4-0.3 % GEL Place 1 drop into both eyes 2 (two) times daily.     . RESTASIS 0.05 % ophthalmic emulsion INSTILL ONE DROP IN BOTH  EYES TWO TIMES DAILY 180 each 0  . traMADol (ULTRAM) 50 MG tablet TAKE 1 TABLET BY MOUTH 3  TIMES A DAY 90 tablet 0  . gabapentin (NEURONTIN) 300 MG capsule TAKE 2 CAPSULES EVERY  MORNING, 1 CAPSULE EVERY  AFTERNOON AND 2 CAPSULES AT BEDTIME. 450 capsule 1   No facility-administered medications prior to visit.      Per HPI unless specifically indicated in ROS section below Review of Systems Objective:    BP 136/78 (BP Location: Left Arm, Patient Position: Sitting, Cuff Size: Large)   Pulse (!) 102   Temp 98 F (36.7 C) (Oral)   Ht 5\' 3"  (1.6 m)   Wt 187 lb (84.8 kg)   SpO2 97%   BMI 33.13 kg/m   Wt Readings  from Last 3 Encounters:  04/03/18 187 lb (84.8 kg)  02/05/18 195 lb (88.5 kg)  01/30/18 194 lb 12 oz (88.3 kg)    Physical Exam Vitals signs and nursing note reviewed.  Constitutional:      General: She is not in acute distress.    Appearance: Normal appearance. She is well-developed.  HENT:     Head: Normocephalic and atraumatic.     Mouth/Throat:     Mouth: Mucous membranes are moist.     Pharynx: Oropharynx is clear.  Eyes:     General: No scleral icterus.    Conjunctiva/sclera: Conjunctivae normal.     Pupils: Pupils are equal, round, and reactive to light.  Neck:     Musculoskeletal: Normal range of motion and neck supple.     Vascular: No carotid bruit.  Cardiovascular:     Rate and Rhythm: Regular rhythm. Tachycardia present.     Pulses: Normal pulses.     Heart sounds: Normal heart sounds. No murmur.  Pulmonary:     Effort: Pulmonary effort is normal. No respiratory distress.     Breath sounds: Normal breath sounds. No wheezing, rhonchi or rales.  Abdominal:     General: Abdomen is flat. Bowel sounds are normal. There is no distension.     Palpations: Abdomen is soft. There is no mass.     Tenderness: There is no abdominal tenderness. There is no right CVA tenderness, left CVA tenderness, guarding or rebound.     Hernia: No hernia is present.  Lymphadenopathy:     Cervical: No cervical adenopathy.  Skin:    General: Skin is warm and dry.     Findings: No rash.  Neurological:     General: No focal deficit present.     Mental Status: She is alert.     Cranial Nerves: Cranial nerves are intact.     Sensory: Sensation is intact.     Motor: Motor function is intact.     Coordination: Coordination is intact. Romberg sign negative. Coordination normal. Finger-Nose-Finger Test normal.     Comments: CN 2-12 intact FTN intact EOMI  Psychiatric:        Mood and Affect: Mood normal.       Results for orders placed or performed in visit on 04/03/18  Comprehensive  metabolic panel  Result Value Ref Range   Sodium 137 135 - 145 mEq/L   Potassium 4.0 3.5 - 5.1 mEq/L   Chloride 102 96 - 112 mEq/L   CO2 22 19 -  32 mEq/L   Glucose, Bld 108 (H) 70 - 99 mg/dL   BUN 8 6 - 23 mg/dL   Creatinine, Ser 1.09 0.40 - 1.20 mg/dL   Total Bilirubin 0.7 0.2 - 1.2 mg/dL   Alkaline Phosphatase 86 39 - 117 U/L   AST 43 (H) 0 - 37 U/L   ALT 28 0 - 35 U/L   Total Protein 8.6 (H) 6.0 - 8.3 g/dL   Albumin 4.5 3.5 - 5.2 g/dL   Calcium 10.3 8.4 - 10.5 mg/dL   GFR 60.83 >60.00 mL/min  TSH  Result Value Ref Range   TSH 3.55 0.35 - 4.50 uIU/mL  CBC with Differential/Platelet  Result Value Ref Range   WBC 5.9 4.0 - 10.5 K/uL   RBC 5.04 3.87 - 5.11 Mil/uL   Hemoglobin 13.9 12.0 - 15.0 g/dL   HCT 42.2 36.0 - 46.0 %   MCV 83.7 78.0 - 100.0 fl   MCHC 32.9 30.0 - 36.0 g/dL   RDW 14.3 11.5 - 15.5 %   Platelets 263.0 150.0 - 400.0 K/uL   Neutrophils Relative % 62.8 43.0 - 77.0 %   Lymphocytes Relative 24.7 12.0 - 46.0 %   Monocytes Relative 10.4 3.0 - 12.0 %   Eosinophils Relative 1.3 0.0 - 5.0 %   Basophils Relative 0.8 0.0 - 3.0 %   Neutro Abs 3.7 1.4 - 7.7 K/uL   Lymphs Abs 1.5 0.7 - 4.0 K/uL   Monocytes Absolute 0.6 0.1 - 1.0 K/uL   Eosinophils Absolute 0.1 0.0 - 0.7 K/uL   Basophils Absolute 0.0 0.0 - 0.1 K/uL  T4, free  Result Value Ref Range   Free T4 0.91 0.60 - 1.60 ng/dL  POCT Urinalysis Dipstick (Automated)  Result Value Ref Range   Color, UA yellow    Clarity, UA clear    Glucose, UA Negative Negative   Bilirubin, UA negative    Ketones, UA 3+    Spec Grav, UA 1.015 1.010 - 1.025   Blood, UA 1+    pH, UA 6.5 5.0 - 8.0   Protein, UA Negative Negative   Urobilinogen, UA 2.0 (A) 0.2 or 1.0 E.U./dL   Nitrite, UA negative    Leukocytes, UA Small (1+) (A) Negative   Assessment & Plan:   Problem List Items Addressed This Visit    Transient alteration of awareness - Primary    Episode this week of several days of altered sensorium of unclear cause as  of yet, associated with dizziness. Possibly med related, possible UTI, r/o CVA or other neurologic cause. Exam today nonfocal, overall no residual symptoms present, mentation back to normal.  Son did stop all her medications indiscriminately at the beginning of the week - discussed dangers of this especially her controlled medications specifically xanax. She states she will go to his house right away and restart meds.  Will go ahead and decrease gabapentin dose to start (to 300/300/600) and I did suggest she contact her psychiatrist to see if she could lower any of her psychotropic med dosing and cardiology for cardiac meds.  Will check lab work today including CBC,TFTs, CMP as well as UA/culture to r/o organic cause or UTI as cause. If unrevealing evaluation, would consider head imaging help r/o CVA. Pt agrees with plan.       Relevant Orders   Comprehensive metabolic panel (Completed)   TSH (Completed)   CBC with Differential/Platelet (Completed)   T4, free (Completed)   Urine Culture   POCT Urinalysis Dipstick (  Automated) (Completed)   TIA (transient ischemic attack)    H/o this vs migraine variant 2017. She is on aspirin 325mg , zetia daily.       Peripheral neuropathy    Stable period - trial lower gabapentin dosing. She is also on amitriptyline.  Sjogren vs idiopathic.      Relevant Medications   gabapentin (NEURONTIN) 300 MG capsule       Meds ordered this encounter  Medications  . gabapentin (NEURONTIN) 300 MG capsule    Sig: 300mg  in am, 300mg  in afternoon and 600mg  at night time    Dispense:  360 capsule    Refill:  1    Note new sig   Orders Placed This Encounter  Procedures  . Urine Culture  . Comprehensive metabolic panel  . TSH  . CBC with Differential/Platelet  . T4, free  . POCT Urinalysis Dipstick (Automated)   Patient Instructions  Urine culture sent today. Labs today.  This could be med related, this could be urine infection.  Try lower dose of  gabapentin - we will try slow taper down. Check with other doctors about dosing of lexapro, amitriptyline, xanax.  Restart other medicines, watching blood pressures to ensure not too low.  Let me know how you're doing.    Follow up plan: Return if symptoms worsen or fail to improve.  Ria Bush, MD

## 2018-04-04 ENCOUNTER — Encounter: Payer: Self-pay | Admitting: Family Medicine

## 2018-04-04 DIAGNOSIS — R404 Transient alteration of awareness: Secondary | ICD-10-CM | POA: Insufficient documentation

## 2018-04-04 LAB — URINE CULTURE
MICRO NUMBER:: 71180
Result:: NO GROWTH
SPECIMEN QUALITY:: ADEQUATE

## 2018-04-04 NOTE — Assessment & Plan Note (Addendum)
Episode this week of several days of altered sensorium of unclear cause as of yet, associated with dizziness. Possibly med related, possible UTI, r/o CVA or other neurologic cause. Exam today nonfocal, overall no residual symptoms present, mentation back to normal.  Son did stop all her medications indiscriminately at the beginning of the week - discussed dangers of this especially her controlled medications specifically xanax. She states she will go to his house right away and restart meds.  Will go ahead and decrease gabapentin dose to start (to 300/300/600) and I did suggest she contact her psychiatrist to see if she could lower any of her psychotropic med dosing and cardiology for cardiac meds.  Will check lab work today including CBC,TFTs, CMP as well as UA/culture to r/o organic cause or UTI as cause. If unrevealing evaluation, would consider head imaging help r/o CVA. Pt agrees with plan.

## 2018-04-04 NOTE — Assessment & Plan Note (Addendum)
Stable period - trial lower gabapentin dosing. She is also on amitriptyline.  Sjogren vs idiopathic.

## 2018-04-04 NOTE — Assessment & Plan Note (Signed)
H/o this vs migraine variant 2017. She is on aspirin 325mg , zetia daily.

## 2018-04-16 ENCOUNTER — Other Ambulatory Visit: Payer: Self-pay | Admitting: Family Medicine

## 2018-04-24 ENCOUNTER — Other Ambulatory Visit (HOSPITAL_COMMUNITY): Payer: Self-pay | Admitting: Psychiatry

## 2018-04-24 ENCOUNTER — Other Ambulatory Visit: Payer: Self-pay | Admitting: Family Medicine

## 2018-05-04 ENCOUNTER — Encounter (HOSPITAL_COMMUNITY): Payer: Self-pay | Admitting: Psychiatry

## 2018-05-04 ENCOUNTER — Ambulatory Visit (INDEPENDENT_AMBULATORY_CARE_PROVIDER_SITE_OTHER): Payer: Medicare Other | Admitting: Psychiatry

## 2018-05-04 VITALS — BP 116/73 | HR 72 | Ht 63.0 in | Wt 188.0 lb

## 2018-05-04 DIAGNOSIS — F331 Major depressive disorder, recurrent, moderate: Secondary | ICD-10-CM | POA: Diagnosis not present

## 2018-05-04 MED ORDER — ESCITALOPRAM OXALATE 20 MG PO TABS: 20.0000 mg | ORAL_TABLET | Freq: Two times a day (BID) | ORAL | 2 refills | Status: DC

## 2018-05-04 MED ORDER — ALPRAZOLAM 0.5 MG PO TABS: 0.5000 mg | ORAL_TABLET | Freq: Two times a day (BID) | ORAL | 2 refills | Status: DC

## 2018-05-04 MED ORDER — AMITRIPTYLINE HCL 25 MG PO TABS: 25.0000 mg | ORAL_TABLET | Freq: Every day | ORAL | 2 refills | Status: DC
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
## 2018-05-05 ENCOUNTER — Telehealth: Payer: Self-pay | Admitting: *Deleted

## 2018-05-05 DIAGNOSIS — I359 Nonrheumatic aortic valve disorder, unspecified: Secondary | ICD-10-CM

## 2018-05-05 DIAGNOSIS — I059 Rheumatic mitral valve disease, unspecified: Secondary | ICD-10-CM

## 2018-05-05 MED ORDER — METOPROLOL SUCCINATE ER 50 MG PO TB24: 50.0000 mg | ORAL_TABLET | Freq: Two times a day (BID) | ORAL | Status: DC

## 2018-05-05 MED ORDER — DILTIAZEM HCL ER COATED BEADS 240 MG PO CP24: 240.0000 mg | ORAL_CAPSULE | Freq: Every day | ORAL | 3 refills | Status: DC

## 2018-05-05 NOTE — Telephone Encounter (Addendum)
Spoke with pt, Aware of dr Jacalyn Lefevre recommendations. New script sent to the pharmacy and Follow up scheduled  ----- Message from Lelon Perla, MD sent at 05/05/2018  5:19 AM EST ----- Change toprol to 50 bid, dc amlodipine, cardizem cd 240 mg daily, paov 2 weeks to titrate meds Kirk Ruths ----- Message ----- From: Cloria Spring, MD Sent: 05/04/2018   9:20 AM EST To: Lelon Perla, MD  Hi Dr. Stanford Breed, This is [name], psychiatrist for Ms. [name].  She has been having episodes of severe fatigue memory loss and disorientation since her metoprolol was increased.  She does not feel safe to drive at this point which is really limiting her activities.  Her primary doctor is cut down gabapentin and I will be cutting down her Xanax but could year or your staff also look at other alternatives to the metoprolol in the morning.  It seems to be putting a damper on her entire day. Thank you, [name], MD

## 2018-05-06 ENCOUNTER — Telehealth: Payer: Self-pay | Admitting: Cardiology

## 2018-05-06 ENCOUNTER — Other Ambulatory Visit: Payer: Self-pay

## 2018-05-06 MED ORDER — DILTIAZEM HCL ER COATED BEADS 240 MG PO CP24: 240.0000 mg | ORAL_CAPSULE | Freq: Every day | ORAL | 3 refills | Status: DC

## 2018-05-06 NOTE — Telephone Encounter (Signed)
New message   Pt c/o medication issue:  1. Name of Medication: diltiazem (CARDIZEM CD) 240 MG 24 hr capsule      Amlodipine 5 mg   2. How are you currently taking this medication (dosage and times per day)? 1 time daily   3. Are you having a reaction (difficulty breathing--STAT)? No   4. What is your medication issue? Per [facility]  is the patient supposed to be on both medications and does 1 of these medications need to be discontinued?

## 2018-05-06 NOTE — Telephone Encounter (Signed)
Spoke with [facility], amlodipine has been stopped.

## 2018-05-12 NOTE — Progress Notes (Signed)
Office Visit Note  Patient: Alexandria Leach             Date of Birth: 11-09-1950           MRN: [id number]             PCP: Ria Bush, MD Referring: Ria Bush, MD Visit Date: 05/26/2018 Occupation: @GUAROCC @  Subjective:  Sicca symptoms   History of Present Illness: Alexandria Leach is a 68 y.o. female with history of Sjogren's, fibromyalgia, and DDD.  She is taking pilocarpine 5 mg po BID PRN.  She discontinued Plaquenil in September 2018 due to misunderstanding a recommendation to avoid taking tramadol due to the interaction with Lexapro.  She has not a significant difference since discontinuing Plaquenil but she would like to restart.  She continues to have significant sicca symptoms.  She continues to use Biotene mouthwash on a regular basis.  She has a dentist every 6 months and has not had any recent dental caries.  She reports that she is having increased pain in bilateral hands but denies any joint swelling.  She reports she is been having fibromyalgia flare for the past 1 week.  She is having muscle aches and muscle tenderness.  She continues to gabapentin which helps with her pain as well as tramadol very sparingly.  She reports her level of fatigue has been stable and she has been sleeping well at night.  She continues to have bilateral trochanter bursitis.   Activities of Daily Living:  Patient reports morning stiffness for 3 hours.   Patient Denies nocturnal pain.  Difficulty dressing/grooming: Denies Difficulty climbing stairs: Reports Difficulty getting out of chair: Reports Difficulty using hands for taps, buttons, cutlery, and/or writing: Reports  Review of Systems  Constitutional: Positive for fatigue.  HENT: Positive for mouth dryness. Negative for mouth sores and nose dryness.   Eyes: Positive for dryness. Negative for pain and visual disturbance.  Respiratory: Negative for cough, hemoptysis, shortness of breath and difficulty breathing.     Cardiovascular: Negative for chest pain, palpitations, hypertension and swelling in legs/feet.  Gastrointestinal: Negative for blood in stool, constipation and diarrhea.  Endocrine: Negative for increased urination.  Genitourinary: Negative for difficulty urinating and painful urination.  Musculoskeletal: Positive for arthralgias, joint pain, joint swelling, morning stiffness and muscle tenderness. Negative for myalgias, muscle weakness and myalgias.  Skin: Negative for color change, pallor, rash, hair loss, nodules/bumps, skin tightness, ulcers and sensitivity to sunlight.  Allergic/Immunologic: Negative for susceptible to infections.  Neurological: Negative for dizziness, numbness, headaches and weakness.  Hematological: Negative for bruising/bleeding tendency and swollen glands.  Psychiatric/Behavioral: Negative for depressed mood and sleep disturbance. The patient is not nervous/anxious.     PMFS History:  Patient Active Problem List   Diagnosis Date Noted  . Transient alteration of awareness 04/04/2018  . Abnormal serum thyroid stimulating hormone (TSH) level 01/30/2018  . 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[place]) 12/22/2017  . Cough 11/06/2017  . Thyroid nodule 09/16/2017  . Sialoadenitis of submandibular gland 10/29/2016  . High risk medication use 06/05/2016  . Peripheral neuropathy 01/30/2016  . DNR no code (do not resuscitate) 12/08/2015  . 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 TIA (transient ischemic attack) 10/12/2015  . Mild mitral regurgitation 10/12/2015  . Osteopenia 09/18/2015  . Right arm pain 08/29/2015  . History of fall   . Trimalleolar fracture of left ankle 02/23/2015  . Right sided sciatica 02/21/2015  . Imbalance 01/12/2015  . Transaminitis 12/24/2014  . DDD (  degenerative disc disease), cervical   . Health maintenance examination 10/18/2014  . Advanced care planning/counseling discussion 10/18/2014  . Medicare annual wellness visit, initial  10/18/2014  . Refusal of blood transfusions as patient is Jehovah's Witness   . Sjogren's syndrome ([place])   . CKD (chronic kidney disease) stage 3, GFR 30-59 ml/min (HCC)   . Glaucoma   . Fibromyalgia   . Osteoarthritis   . History of pulmonary embolism   . Lung nodule 09/22/2012  . Diastolic CHF ([place]) 93/81/0175  . Aortic stenosis 04/22/2011  . Palpitations 08/10/2010  . Dyspnea 08/10/2010  . HOT FLASHES 06/28/2008  . Trochanteric bursitis of right hip 06/28/2008  . ARTHRALGIA 07/01/2007  . BACK PAIN, LEFT 08/25/2006  . GAD (generalized anxiety disorder) 03/28/2006  . MITRAL VALVE REPLACEMENT, HX OF 03/28/2006  . TAH/BSO, HX OF 03/28/2006  . HYPERCHOLESTEROLEMIA 12/31/2005  . MDD (major depressive disorder), recurrent episode ([place]) 12/31/2005  . RHEUMATIC HEART DISEASE 12/31/2005  . Essential hypertension 12/31/2005  . Chronic insomnia 12/31/2005    Past Medical History:  Diagnosis Date  . Ankle fracture, left 02/19/2015   from a fall  . Anxiety   . Cataract 2014   corrected with surgery  . CHF (congestive heart failure) ([place])   . CKD (chronic kidney disease) stage 3, GFR 30-59 ml/min [facility])    saw nephrologist Dr Harden Mo  . DDD (degenerative disc disease), cervical   . Depression   . Fibromyalgia   . Glaucoma    s/p surgery, sees ophtho Q6 mo  . 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 History of kidney stones 2010  . History of pulmonary embolism 2001, 2006   completed coumadin courses  . History of rheumatic fever x3  . HLD (hyperlipidemia)   . HTN (hypertension)   . Insomnia   . Lung nodule 09/22/2012   RLL - 1mm, stable since 2014. Thought benign.   . Osteoarthritis    shoulders and knees, not RA per Dr Estanislado Pandy, positive ANA, positive Ro  . Osteopenia 09/18/2015   DEXA T -1.1 hip, -0.2 spine 08/2015   . Personal history of urinary calculi latest 2014  . Pneumonia 12/02/2011  . PONV (postoperative  nausea and vomiting)   . Refusal of blood transfusions as patient is Jehovah's Witness   . Rheumatic heart disease 1980   s/p mitral valve repair 1980  . Sjogren's syndrome ([place])     Family History  Problem Relation Age of Onset  . Lupus Sister        and niece  . Cancer Mother        lung (nonsmoker)  . CAD Mother        MI in her 41s  . ALS Mother   . Kidney disease Father   . Alcohol abuse Father   . Diabetes Father   . Cancer Brother        bone  . Diabetes Sister   . Stroke Sister   . Cancer Maternal Uncle        bone  . Depression Sister   . Kidney failure Other        on HD  . Diabetes Brother   . Heart attack Brother   . Stroke Maternal Grandmother    Past Surgical History:  Procedure Laterality Date  . BREAST BIOPSY Right 2006   benign  . CHOLECYSTECTOMY  11/27/2011   Procedure: LAPAROSCOPIC CHOLECYSTECTOMY WITH INTRAOPERATIVE CHOLANGIOGRAM;  Surgeon: Adin Hector, MD;  Location: [facility];  Service: General;  Laterality: N/A;  laparoscopic cholecystectomy with choleangiogram umbilical hernia repair  . COLONOSCOPY  07/2014   WNL Amedeo Plenty)  . dexa  08/2012   normal per patient - no records available  . EYE SURGERY Bilateral 2014   cataract removal  . MITRAL VALVE REPAIR  1980   open heart  . ORIF ANKLE FRACTURE Left 02/26/2015   Procedure: OPEN REDUCTION INTERNAL FIXATION (ORIF) LEFT TRIMALLEOLAR ANKLE FRACTURE;  Surgeon: [name], MD;  Location: [facility];  Service: Orthopedics;  Laterality: Left;  . TUBAL LIGATION  1980  . UMBILICAL HERNIA REPAIR  11/27/2011   Procedure: HERNIA REPAIR UMBILICAL ADULT;  Surgeon: Adin Hector, MD;  Location: [facility];  Service: General;  Laterality: N/A;  . [place]   for fibroids -- partial, ovaries remain   Social History   Social History Narrative   Lives with son, 1 dog   Occupation: unemployed, on disability for fibromyalgia since 2008.   Edu: HS   Religion: Jehova's witness   Activity: volunteers  at senior center   Diet: some water, fruits/vegetables daily   No caffeine use   Immunization History  Administered Date(s) Administered  . Pneumococcal Conjugate-13 12/15/2017  . Pneumococcal Polysaccharide-23 09/15/2012     Objective: Vital Signs: BP (!) 155/80 (BP Location: Left Arm, Patient Position: Sitting, Cuff Size: Normal)   Pulse 94   Resp 16   Ht 5\' 1"  (1.549 m)   Wt 198 lb 3.2 oz (89.9 kg)   BMI 37.45 kg/m    Physical Exam Vitals signs and nursing note reviewed.  Constitutional:      Appearance: She is well-developed.  HENT:     Head: Normocephalic and atraumatic.  Eyes:     Conjunctiva/sclera: Conjunctivae normal.  Neck:     Musculoskeletal: Normal range of motion.  Cardiovascular:     Rate and Rhythm: Normal rate and regular rhythm.     Heart sounds: Normal heart sounds.  Pulmonary:     Effort: Pulmonary effort is normal.     Breath sounds: Normal breath sounds.  Abdominal:     General: Bowel sounds are normal.     Palpations: Abdomen is soft.  Lymphadenopathy:     Cervical: No cervical adenopathy.  Skin:    General: Skin is warm and dry.     Capillary Refill: Capillary refill takes less than 2 seconds.  Neurological:     Mental Status: She is alert and oriented to person, place, and time.  Psychiatric:        Behavior: Behavior normal.      Musculoskeletal Exam: C-spine, thoracic spine, and lumbar spine good ROM.  No midline spinal tenderness.  No SI joint tenderness.  Shoulder joints, elbow joints, wrist joints, MCPs, PIPs, and DIPs good ROM with no synovitis.  Hip joints, knee joints, ankle joints, MTPs, PIPs, and DIPs good ROM with no synovitis. No warmth or effusion of knee joints.  Tenderness over bilateral trochanteric bursa.    CDAI Exam: CDAI Score: Not documented Patient Global Assessment: Not documented; Provider Global Assessment: Not documented Swollen: Not documented; Tender: Not documented Joint Exam   Not documented   There is  currently no information documented on the homunculus. Go to the Rheumatology activity and complete the homunculus joint exam.  Investigation: No additional findings.  Imaging: No results found.  Recent Labs: Lab Results  Component Value Date   WBC 5.9 04/03/2018  HGB 13.9 04/03/2018   PLT 263.0 04/03/2018   NA 137 04/03/2018   K 4.0 04/03/2018   CL 102 04/03/2018   CO2 22 04/03/2018   GLUCOSE 108 (H) 04/03/2018   BUN 8 04/03/2018   CREATININE 1.09 04/03/2018   BILITOT 0.7 04/03/2018   ALKPHOS 86 04/03/2018   AST 43 (H) 04/03/2018   ALT 28 04/03/2018   PROT 8.6 (H) 04/03/2018   ALBUMIN 4.5 04/03/2018   CALCIUM 10.3 04/03/2018   GFRAA 68 01/27/2018    Speciality Comments: No specialty comments available.  Procedures:  No procedures performed Allergies: Cymbalta [duloxetine hcl]; Statins; and Sulfa antibiotics   Assessment / Plan:     Visit Diagnoses: Sjogren's syndrome with keratoconjunctivitis sicca (HCC) - +ANA, +Ro: She continues to have sicca symptoms.  She has no parotid swelling or tenderness on exam.  She has been using Biotene mouthwash which has been providing symptomatic relief.  She continues to see the dentist every 6 months and has not had any recent dental caries.  She has been taking pilocarpine 5 mg twice daily PRN for symptomatic relief.  She discontinued Plaquenil in September 2018 due to misunderstanding the drug interaction between Lexapro and tramadol not PLQ.  She would like to restart Plaquenil at this time.  We reviewed the indications, contraindications, and potential side effects of Plaquenil.  All questions were addressed.  She will restart on Plaquenil 200 mg twice daily.  She is advised to notify us if develops any side effects.  She will schedule an updated Plaquenil eye exam.  We will check CBC and CMP today.  We reviewed the routine lab schedule. We will also check UA, Ro, La, and ANA. She will follow-up in 5 months.- Plan: Urinalysis, Routine w  reflex microscopic, CBC with Differential/Platelet, COMPLETE METABOLIC PANEL WITH GFR, ANA, Sjogrens syndrome-A extractable nuclear antibody, Sjogrens syndrome-B extractable nuclear antibody  Patient was counseled on the purpose, proper use, and adverse effects of hydroxychloroquine including nausea/diarrhea, skin rash, headaches, and sun sensitivity.  Discussed importance of annual eye exams while on hydroxychloroquine to monitor to ocular toxicity and discussed importance of frequent laboratory monitoring.  Provided patient with eye exam form for baseline ophthalmologic exam.  Provided patient with educational materials on hydroxychloroquine and answered all questions.  Patient consented to hydroxychloroquine.  Will upload consent in the media tab.    Dose will be Plaquenil 200 mg twice daily.  Prescription pending lab results.  High risk medication use - Plaquenil 200 mg by mouth BID, pilocarpine 5 mg by mouth 2 times a day when necessary. eye exam: 06/05/2016.  She will schedule an updated eye exam.  CBC and CMP will be drawn today.- Plan: CBC with Differential/Platelet, COMPLETE METABOLIC PANEL WITH GFR  Fibromyalgia: She has been experiencing fibromyalgia flare for the past 1 week.  She has been having increased generalized muscle aches, notes tenderness.  She has generalized hyperalgesia and positive tender points on exam.  She has bilateral trochanteric bursitis. She was given a handout of exercises. She reports her level of fatigue has been stable and she has been sleeping well at night.  The importance of regular exercise and good sleep hygiene was discussed.  Osteopenia of multiple sites: She is taking a vitamin D supplement.   DDD (degenerative disc disease), cervical: She has good range of motion with no discomfort at this time.  She has no symptoms of radiculopathy.  Other chronic pain: She is taking gabapentin as prescribed.  She takes tramadol very  sparingly for pain relief.  Other  insomnia: She has been sleeping well at night.  Good schedule was discussed.  Other fatigue: Chronic but stable.   Other medical conditions are listed as follows:   History of pulmonary embolism  History of hypertension  CKD (chronic kidney disease) stage 3, GFR 30-59 ml/min (HCC)  History of anxiety  History of hypercholesterolemia   Orders: Orders Placed This Encounter  Procedures  . Urinalysis, Routine w reflex microscopic  . CBC with Differential/Platelet  . COMPLETE METABOLIC PANEL WITH GFR  . ANA  . Sjogrens syndrome-A extractable nuclear antibody  . Sjogrens syndrome-B extractable nuclear antibody   No orders of the defined types were placed in this encounter.   Face-to-face time spent with patient was 30 minutes. Greater than 50% of time was spent in counseling and coordination of care.  Follow-Up Instructions: Return in about 5 months (around 10/26/2018) for Sjogren's syndrome, Fibromyalgia, DDD.   Ofilia Neas, PA-C  Note - This record has been created using Dragon software.  Chart creation errors have been sought, but may not always  have been located. Such creation errors do not reflect on  the standard of medical care.

## 2018-05-19 ENCOUNTER — Ambulatory Visit: Payer: Medicare Other | Admitting: Sports Medicine

## 2018-05-21 ENCOUNTER — Ambulatory Visit: Payer: Self-pay | Admitting: Cardiology

## 2018-05-26 ENCOUNTER — Ambulatory Visit (INDEPENDENT_AMBULATORY_CARE_PROVIDER_SITE_OTHER): Payer: Medicare Other | Admitting: Physician Assistant

## 2018-05-26 ENCOUNTER — Encounter: Payer: Self-pay | Admitting: Physician Assistant

## 2018-05-26 VITALS — BP 155/80 | HR 94 | Resp 16 | Ht 61.0 in | Wt 198.2 lb

## 2018-05-26 DIAGNOSIS — M5030 Other cervical disc degeneration, unspecified cervical region: Secondary | ICD-10-CM | POA: Diagnosis not present

## 2018-05-26 DIAGNOSIS — Z8639 Personal history of other endocrine, nutritional and metabolic disease: Secondary | ICD-10-CM

## 2018-05-26 DIAGNOSIS — Z79899 Other long term (current) drug therapy: Secondary | ICD-10-CM | POA: Diagnosis not present

## 2018-05-26 DIAGNOSIS — Z8679 Personal history of other diseases of the circulatory system: Secondary | ICD-10-CM

## 2018-05-26 DIAGNOSIS — G8929 Other chronic pain: Secondary | ICD-10-CM

## 2018-05-26 DIAGNOSIS — M797 Fibromyalgia: Secondary | ICD-10-CM

## 2018-05-26 DIAGNOSIS — Z86711 Personal history of pulmonary embolism: Secondary | ICD-10-CM

## 2018-05-26 DIAGNOSIS — M8589 Other specified disorders of bone density and structure, multiple sites: Secondary | ICD-10-CM

## 2018-05-26 DIAGNOSIS — M3501 Sicca syndrome with keratoconjunctivitis: Secondary | ICD-10-CM

## 2018-05-26 DIAGNOSIS — N183 Chronic kidney disease, stage 3 unspecified: Secondary | ICD-10-CM

## 2018-05-26 DIAGNOSIS — G4709 Other insomnia: Secondary | ICD-10-CM

## 2018-05-26 DIAGNOSIS — R5383 Other fatigue: Secondary | ICD-10-CM

## 2018-05-26 DIAGNOSIS — Z8659 Personal history of other mental and behavioral disorders: Secondary | ICD-10-CM

## 2018-05-26 MED ORDER — HYDROXYCHLOROQUINE SULFATE 200 MG PO TABS: 200.0000 mg | ORAL_TABLET | Freq: Two times a day (BID) | ORAL | 0 refills | Status: DC

## 2018-05-26 NOTE — Patient Instructions (Addendum)
**Please update your Plaquenil eye exam in the next month**  Standing Labs We placed an order today for your standing lab work.    Please come back and get your standing labs in 1 month and then every 5 months.  We have open lab Monday through Friday from 8:30-11:30 AM and 1:30-4:00 PM  at the office of Dr. Bo Merino.   You may experience shorter wait times on Monday and Friday afternoons. The office is located at [address] [place], [place], [place] [address] No appointment is necessary.   Labs are drawn by [facility].  You may receive a bill from [facility] for your lab work.  If you wish to have your labs drawn at another location, please call the office 24 hours in advance to send orders.  If you have any questions regarding directions or hours of operation,  please call [phone].   Just as a reminder please drink plenty of water prior to coming for your lab work. Thanks!  Trochanteric Bursitis Rehab Ask your health care provider which exercises are safe for you. Do exercises exactly as told by your health care provider and adjust them as directed. It is normal to feel mild stretching, pulling, tightness, or discomfort as you do these exercises, but you should stop right away if you feel sudden pain or your pain gets worse.Do not begin these exercises until told by your health care provider. Stretching exercises These exercises warm up your muscles and joints and improve the movement and flexibility of your hip. These exercises also help to relieve pain and stiffness. Exercise A: Iliotibial band stretch  1. Lie on your side with your left / right leg in the top position. 2. Bend your left / right knee and grab your ankle. 3. Slowly bring your knee back so your thigh is behind your body. 4. Slowly lower your knee toward the floor until you feel a gentle stretch on the outside of your left / right thigh. If you do not feel a stretch and your knee will not fall farther,  place the heel of your other foot on top of your outer knee and pull your thigh down farther. 5. Hold this position for __________ seconds. 6. Slowly return to the starting position. Repeat __________ times. Complete this exercise __________ times a day. Strengthening exercises These exercises build strength and endurance in your hip and pelvis. Endurance is the ability to use your muscles for a long time, even after they get tired. Exercise B: Bridge (hip extensors)  1. Lie on your back on a firm surface with your knees bent and your feet flat on the floor. 2. Tighten your buttocks muscles and lift your buttocks off the floor until your trunk is level with your thighs. You should feel the muscles working in your buttocks and the back of your thighs. If this exercise is too easy, try doing it with your arms crossed over your chest. 3. Hold this position for __________ seconds. 4. Slowly return to the starting position. 5. Let your muscles relax completely between repetitions. Repeat __________ times. Complete this exercise __________ times a day. Exercise C: Squats (knee extensors and  quadriceps) 1. Stand in front of a table, with your feet and knees pointing straight ahead. You may rest your hands on the table for balance but not for support. 2. Slowly bend your knees and lower your hips like you are going to sit in a chair. ? Keep your weight over your heels, not  over your toes. ? Keep your lower legs upright so they are parallel with the table legs. ? Do not let your hips go lower than your knees. ? Do not bend lower than told by your health care provider. ? If your hip pain increases, do not bend as low. 3. Hold this position for __________ seconds. 4. Slowly push with your legs to return to standing. Do not use your hands to pull yourself to standing. Repeat __________ times. Complete this exercise __________ times a day. Exercise D: Hip hike 1. Stand sideways on a bottom step. Stand  on your left / right leg with your other foot unsupported next to the step. You can hold onto the railing or wall if needed for balance. 2. Keeping your knees straight and your torso square, lift your left / right hip up toward the ceiling. 3. Hold this position for __________ seconds. 4. Slowly let your left / right hip lower toward the floor, past the starting position. Your foot should get closer to the floor. Do not lean or bend your knees. Repeat __________ times. Complete this exercise __________ times a day. Exercise E: Single leg stand 1. Stand near a counter or door frame that you can hold onto for balance as needed. It is helpful to stand in front of a mirror for this exercise so you can watch your hip. 2. Squeeze your left / right buttock muscles then lift up your other foot. Do not let your left / right hip push out to the side. 3. Hold this position for __________ seconds. Repeat __________ times. Complete this exercise __________ times a day. This information is not intended to replace advice given to you by your health care provider. Make sure you discuss any questions you have with your health care provider. Document Released: 04/11/2004 Document Revised: 11/09/2015 Document Reviewed: 02/17/2015 Elsevier Interactive Patient Education  2019 [employer].

## 2018-05-26 NOTE — Progress Notes (Signed)
Pharmacy Note  Subjective: Patient presents today to the [facility] to see Dr. Estanislado Pandy.  Patient seen by the pharmacist for counseling on hydroxychloroquine Sjogren's syndrome.  She was previously on Plaquenil but has not been on therapy in about a year due to confusion about a drug interaction.    Objective: CMP     Component Value Date/Time   NA 137 04/03/2018 0948   NA 141 01/27/2018 1152   NA 140 12/14/2014   K 4.0 04/03/2018 0948   K 4.5 12/14/2014   CL 102 04/03/2018 0948   CO2 22 04/03/2018 0948   GLUCOSE 108 (H) 04/03/2018 0948   BUN 8 04/03/2018 0948   BUN 7 (L) 01/27/2018 1152   CREATININE 1.09 04/03/2018 0948   CREATININE 1.19 (H) 05/16/2017 1036   CALCIUM 10.3 04/03/2018 0948   PROT 8.6 (H) 04/03/2018 0948   PROT 7.5 11/18/2014 1146   ALBUMIN 4.5 04/03/2018 0948   AST 43 (H) 04/03/2018 0948   AST 46 12/14/2014   ALT 28 04/03/2018 0948   ALT 38 12/14/2014   ALKPHOS 86 04/03/2018 0948   ALKPHOS 75 12/14/2014   BILITOT 0.7 04/03/2018 0948   BILITOT 0.4 12/14/2014   GFRNONAA 59 (L) 01/27/2018 1152   GFRNONAA 48 (L) 05/16/2017 1036   GFRAA 68 01/27/2018 1152   GFRAA 56 (L) 05/16/2017 1036    CBC    Component Value Date/Time   WBC 5.9 04/03/2018 0948   RBC 5.04 04/03/2018 0948   HGB 13.9 04/03/2018 0948   HGB 12.5 01/27/2018 1152   HGB 12.6 12/14/2014   HCT 42.2 04/03/2018 0948   HCT 38.0 01/27/2018 1152   PLT 263.0 04/03/2018 0948   PLT 282 01/27/2018 1152   MCV 83.7 04/03/2018 0948   MCV 85 01/27/2018 1152   MCH 27.8 01/27/2018 1152   MCH 26.7 11/02/2017 0845   MCHC 32.9 04/03/2018 0948   RDW 14.3 04/03/2018 0948   RDW 14.3 01/27/2018 1152   LYMPHSABS 1.5 04/03/2018 0948   MONOABS 0.6 04/03/2018 0948   EOSABS 0.1 04/03/2018 0948   BASOSABS 0.0 04/03/2018 0948    Assessment/Plan: Patient was counseled on the purpose, proper use, and adverse effects of hydroxychloroquine including nausea/diarrhea, skin rash, headaches, and sun  sensitivity. She states she tolerated the medication well in the past. Discussed importance of annual eye exams while on hydroxychloroquine to monitor to ocular toxicity and discussed importance of frequent laboratory monitoring.  Provided patient with eye exam form for baseline ophthalmologic exam and standing lab instructions.  Provided patient with educational materials on hydroxychloroquine and answered all questions.  Patient consented to hydroxychloroquine.  Will upload consent in the media tab.    Dose will be Plaquenil 200 mg twice daily.    All questions encouraged and answered.  Instructed patient to call with any further questions or concerns.  Mariella Saa, PharmD, [facility] Clinical Pharmacist  05/26/2018 8:41 AM

## 2018-05-27 ENCOUNTER — Other Ambulatory Visit: Payer: Self-pay

## 2018-05-27 ENCOUNTER — Ambulatory Visit (INDEPENDENT_AMBULATORY_CARE_PROVIDER_SITE_OTHER): Payer: Medicare Other | Admitting: Cardiology

## 2018-05-27 VITALS — BP 158/74 | HR 97 | Ht 61.0 in | Wt 191.0 lb

## 2018-05-27 DIAGNOSIS — F419 Anxiety disorder, unspecified: Secondary | ICD-10-CM

## 2018-05-27 DIAGNOSIS — R000 Tachycardia, unspecified: Secondary | ICD-10-CM | POA: Insufficient documentation

## 2018-05-27 DIAGNOSIS — F329 Major depressive disorder, single episode, unspecified: Secondary | ICD-10-CM

## 2018-05-27 DIAGNOSIS — Z9889 Other specified postprocedural states: Secondary | ICD-10-CM | POA: Diagnosis not present

## 2018-05-27 DIAGNOSIS — I10 Essential (primary) hypertension: Secondary | ICD-10-CM

## 2018-05-27 MED ORDER — METOPROLOL TARTRATE 25 MG PO TABS: 25.0000 mg | ORAL_TABLET | Freq: Two times a day (BID) | ORAL | 3 refills | Status: DC

## 2018-05-27 MED ORDER — METOPROLOL TARTRATE 25 MG PO TABS: 25.0000 mg | ORAL_TABLET | Freq: Two times a day (BID) | ORAL | 0 refills | Status: DC

## 2018-05-27 NOTE — Patient Instructions (Addendum)
Medication Instructions:  Your physician has recommended you make the following change in your medication:   START TAKING METOPROLOL TARTRATE (LOPRESSOR) 25 MG, ONE TABLET TWICE A DAY.  CONTINUE TO TAKE THE MEDICATIONS LISTED ON YOUR CURRENT MEDICATION LIST THAT APPEARS ON THIS AFTER VISIT SUMMARY.  If you need a refill on your cardiac medications before your next appointment, please call your pharmacy.   Lab work: NONE If you have labs (blood work) drawn today and your tests are completely normal, you will receive your results only by: Marland Kitchen MyChart Message (if you have MyChart) OR . A paper copy in the mail If you have any lab test that is abnormal or we need to change your treatment, we will call you to review the results.  Testing/Procedures: NONE  Follow-Up: At [facility], you and your health needs are our priority.  As part of our continuing mission to provide you with exceptional heart care, we have created designated Provider Care Teams.  These Care Teams include your primary Cardiologist (physician) and Advanced Practice Providers (APPs -  Physician Assistants and Nurse Practitioners) who all work together to provide you with the care you need, when you need it. You have a follow up appointment with Kerin Ransom, PA-C on Monday June 15, 2018 at 2:00 PM. PLEASE BRING ALL OF YOUR MEDICATIONS WITH YOU TO THIS APPOINTMENT.

## 2018-05-27 NOTE — Progress Notes (Signed)
05/27/2018 Alexandria Leach   1950/12/06  [id number]  Primary Physician Ria Bush, MD Primary Cardiologist: Dr Stanford Breed  HPI: Alexandria Leach is a 68 year old female with multiple medical issues who was seen in the office today for follow-up.  She has a history of a mitral valve repair in 1980.  She has chronic diastolic heart failure.  Her last echocardiogram was March 2019, her ejection fraction was 65 to 70% and she had grade 2 diastolic dysfunction.  Other medical issues include chronic renal insufficiency stage III, depression, fibromyalgia, and a history of Sjogren's syndrome.  She saw her psychiatrist recently and complained of fatigue and being "zoned out".  The patient attributed this to an increase in her Toprol for resting tachycardia.  Records indicate she was on Toprol 100 mg twice a day.  Her Toprol was stopped and she was put on diltiazem.  She is in the office today for follow-up.  She complains of tachycardia.  She has fatigue, she says her symptoms are unchanged since stopping metoprolol.  Her resting heart rate is 100.  On reviewing her medications it became clear that she has a lot of confusion about what she takes.  She thinks she is taking the diltiazem twice a day although that is not how it is prescribed.  She did not bring her medications with her.   Current Outpatient Medications  Medication Sig Dispense Refill  . ALPRAZolam (XANAX) 0.5 MG tablet Take 1 tablet (0.5 mg total) by mouth 2 (two) times daily. 180 tablet 2  . amitriptyline (ELAVIL) 25 MG tablet Take 1 tablet (25 mg total) by mouth at bedtime. 90 tablet 2  . antiseptic oral rinse (BIOTENE) LIQD 15 mLs by Mouth Rinse route at bedtime.     Marland Kitchen aspirin 325 MG EC tablet Take 325 mg by mouth daily.     . cholecalciferol (VITAMIN D) 1000 UNITS tablet Take 1,000 Units by mouth daily.     Marland Kitchen Co-Enzyme Q-10 100 MG CAPS Take 1 capsule (100 mg total) by mouth daily.  0  . diclofenac sodium (VOLTAREN) 1 % GEL Apply 1  application topically 2 (two) times daily as needed (pain).    Marland Kitchen diltiazem (CARDIZEM CD) 240 MG 24 hr capsule Take 1 capsule (240 mg total) by mouth daily. 90 capsule 3  . escitalopram (LEXAPRO) 20 MG tablet Take 1 tablet (20 mg total) by mouth 2 (two) times daily. 180 tablet 2  . ezetimibe (ZETIA) 10 MG tablet TAKE 1 TABLET BY MOUTH AT  BEDTIME 90 tablet 2  . famotidine (PEPCID) 20 MG tablet TAKE 1 TABLET BY MOUTH TWO  TIMES DAILY 180 tablet 2  . furosemide (LASIX) 20 MG tablet TAKE 1 TABLET BY MOUTH  DAILY AS NEEDED FOR EDEMA 90 tablet 1  . gabapentin (NEURONTIN) 300 MG capsule 300mg  in am, 300mg  in afternoon and 600mg  at night time 360 capsule 1  . hydrochlorothiazide (MICROZIDE) 12.5 MG capsule TAKE 1 CAPSULE BY MOUTH  DAILY 90 capsule 3  . hydroxychloroquine (PLAQUENIL) 200 MG tablet Take 1 tablet (200 mg total) by mouth 2 (two) times daily. 180 tablet 0  . lovastatin (MEVACOR) 10 MG tablet TAKE 1 TABLET BY MOUTH  EVERY MONDAY, WEDNESDAY,  AND FRIDAY 39 tablet 0  . Polyethyl Glycol-Propyl Glycol (SYSTANE) 0.4-0.3 % GEL Place 1 drop into both eyes 2 (two) times daily.     . RESTASIS 0.05 % ophthalmic emulsion INSTILL ONE DROP IN BOTH  EYES TWO TIMES DAILY 180 each  0  . traMADol (ULTRAM) 50 MG tablet TAKE 1 TABLET BY MOUTH 3  TIMES A DAY 90 tablet 0  . metoprolol tartrate (LOPRESSOR) 25 MG tablet Take 1 tablet (25 mg total) by mouth 2 (two) times daily. 180 tablet 3   No current facility-administered medications for this visit.     Allergies  Allergen Reactions  . Cymbalta [Duloxetine Hcl] Other (See Comments)    tachycardia  . Statins Nausea Only and Other (See Comments)    Muscle cramps also  . Sulfa Antibiotics Nausea And Vomiting    Past Medical History:  Diagnosis Date  . Ankle fracture, left 02/19/2015   from a fall  . Anxiety   . Cataract 2014   corrected with surgery  . CHF (congestive heart failure) ([place])   . CKD (chronic kidney disease) stage 3, GFR 30-59 ml/min [facility])     saw nephrologist Dr Harden Mo  . DDD (degenerative disc disease), cervical   . Depression   . Fibromyalgia   . Glaucoma    s/p surgery, sees ophtho Q6 mo  . 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 History of kidney stones 2010  . History of pulmonary embolism 2001, 2006   completed coumadin courses  . History of rheumatic fever x3  . HLD (hyperlipidemia)   . HTN (hypertension)   . Insomnia   . Lung nodule 09/22/2012   RLL - 12mm, stable since 2014. Thought benign.   . Osteoarthritis    shoulders and knees, not RA per Dr Estanislado Pandy, positive ANA, positive Ro  . Osteopenia 09/18/2015   DEXA T -1.1 hip, -0.2 spine 08/2015   . Personal history of urinary calculi latest 2014  . Pneumonia 12/02/2011  . PONV (postoperative nausea and vomiting)   . Refusal of blood transfusions as patient is Jehovah's Witness   . Rheumatic heart disease 1980   s/p mitral valve repair 1980  . Sjogren's syndrome ([place])     Social History   Socioeconomic History  . Marital status: Married    Spouse name: [name]  . Number of children: 4  . Years of education: 41  . Highest education level: Not on file  Occupational History    Employer: UNEMPLOYED    Comment: Disability  Social Needs  . Financial resource strain: Not on file  . Food insecurity:    Worry: Not on file    Inability: Not on file  . Transportation needs:    Medical: Not on file    Non-medical: Not on file  Tobacco Use  . Smoking status: Never Smoker  . Smokeless tobacco: Never Used  Substance and Sexual Activity  . Alcohol use: No    Alcohol/week: 0.0 standard drinks  . Drug use: No  . Sexual activity: Not Currently    Birth control/protection: Surgical  Lifestyle  . Physical activity:    Days per week: Not on file    Minutes per session: Not on file  . Stress: Not on file  Relationships  . Social connections:    Talks on phone: Not on file    Gets together: Not  on file    Attends religious service: Not on file    Active member of club or organization: Not on file    Attends meetings of clubs or organizations: Not on file    Relationship status: Not on file  . Intimate partner violence:    Fear of current or ex  partner: Not on file    Emotionally abused: Not on file    Physically abused: Not on file    Forced sexual activity: Not on file  Other Topics Concern  . Not on file  Social History Narrative   Lives with son, 1 dog   Occupation: unemployed, on disability for fibromyalgia since 2008.   Edu: HS   Religion: Jehova's witness   Activity: volunteers at senior center   Diet: some water, fruits/vegetables daily   No caffeine use     Family History  Problem Relation Age of Onset  . Lupus Sister        and niece  . Cancer Mother        lung (nonsmoker)  . CAD Mother        MI in her 41s  . ALS Mother   . Kidney disease Father   . Alcohol abuse Father   . Diabetes Father   . Cancer Brother        bone  . Diabetes Sister   . Stroke Sister   . Cancer Maternal Uncle        bone  . Depression Sister   . Kidney failure Other        on HD  . Diabetes Brother   . Heart attack Brother   . Stroke Maternal Grandmother      Review of Systems: General: negative for chills, fever, night sweats or weight changes.  Cardiovascular: negative for chest pain, dyspnea on exertion, edema, orthopnea, palpitations, paroxysmal nocturnal dyspnea or shortness of breath Dermatological: negative for rash Respiratory: negative for cough or wheezing Urologic: negative for hematuria Abdominal: negative for nausea, vomiting, diarrhea, bright red blood per rectum, melena, or hematemesis Neurologic: negative for visual changes, syncope, or dizziness All other systems reviewed and are otherwise negative except as noted above.    Blood pressure (!) 158/74, pulse 97, height 5\' 1"  (1.549 m), weight 191 lb (86.6 kg).  General appearance: alert,  cooperative, no distress and depressed, flat affect Lungs: clear to auscultation bilaterally Heart: regular rate and rhythm and 2/6 systolic murmur LSB Extremities: no edema Skin: Skin color, texture, turgor normal. No rashes or lesions Neurologic: Grossly normal  EKG NSR, ST 100, one PVC  ASSESSMENT AND PLAN:   History of mitral valve repair History of mitral valve repair 1980 Last echo March 2019-moderate MV stenosis, mild MR  Sinus tachycardia Recently taken off Toprol 100 mg BID and placed on Diltiazem secondary to fatigue. Pt tells me no change in her symptoms of fatigue since that change though she does note increased HR.   Anxiety and depression Followed by Dr Harrington Challenger  CKD (chronic kidney disease) stage 3, GFR 30-59 ml/min Last GFR 54  Fibromyalgia .  Sjogren's syndrome ([place]) .   PLAN  Change Diltiazem to 240 mg daily.  Add Lopressor 25 mg BID. RTC in 3-4 weeks-with all her medications.   Kerin Ransom PA-C 05/27/2018 8:57 AM

## 2018-05-27 NOTE — Assessment & Plan Note (Signed)
History of mitral valve repair 1980 Last echo March 2019-moderate MV stenosis, mild MR

## 2018-05-27 NOTE — Assessment & Plan Note (Signed)
Followed by Dr. Ross. 

## 2018-05-27 NOTE — Assessment & Plan Note (Signed)
Last GFR 54

## 2018-05-27 NOTE — Progress Notes (Signed)
UA reveals 1+ leukocytes.  Please notify patient and forward labs to PCP for further evaluation.   CBC WNL Creatinine is elevated and GFR is low-54.  Please forward labs to PCP and advise patient to avoid NSAIDs.  She should return in 1 month to recheck CBC and CMP after resuming plaquenil.

## 2018-05-27 NOTE — Assessment & Plan Note (Signed)
Recently taken off Toprol 100 mg BID and placed on Diltiazem secondary to fatigue. Pt tells me no change in her symptoms of fatigue since that change though she does note increased HR.

## 2018-05-28 LAB — CBC WITH DIFFERENTIAL/PLATELET
Absolute Monocytes: 432 cells/uL (ref 200–950)
Basophils Absolute: 42 cells/uL (ref 0–200)
Basophils Relative: 0.9 %
Eosinophils Absolute: 169 cells/uL (ref 15–500)
Eosinophils Relative: 3.6 %
HCT: 40.3 % (ref 35.0–45.0)
Hemoglobin: 13.1 g/dL (ref 11.7–15.5)
LYMPHS ABS: 1763 {cells}/uL (ref 850–3900)
MCH: 27.2 pg (ref 27.0–33.0)
MCHC: 32.5 g/dL (ref 32.0–36.0)
MCV: 83.8 fL (ref 80.0–100.0)
MPV: 10.8 fL (ref 7.5–12.5)
Monocytes Relative: 9.2 %
Neutro Abs: 2294 cells/uL (ref 1500–7800)
Neutrophils Relative %: 48.8 %
Platelets: 279 10*3/uL (ref 140–400)
RBC: 4.81 10*6/uL (ref 3.80–5.10)
RDW: 14.2 % (ref 11.0–15.0)
Total Lymphocyte: 37.5 %
WBC: 4.7 10*3/uL (ref 3.8–10.8)

## 2018-05-28 LAB — COMPLETE METABOLIC PANEL WITH GFR
AG RATIO: 1.4 (calc) (ref 1.0–2.5)
ALT: 14 U/L (ref 6–29)
AST: 25 U/L (ref 10–35)
Albumin: 4.4 g/dL (ref 3.6–5.1)
Alkaline phosphatase (APISO): 79 U/L (ref 37–153)
BUN/Creatinine Ratio: 5 (calc) — ABNORMAL LOW (ref 6–22)
BUN: 6 mg/dL — ABNORMAL LOW (ref 7–25)
CO2: 24 mmol/L (ref 20–32)
Calcium: 10 mg/dL (ref 8.6–10.4)
Chloride: 105 mmol/L (ref 98–110)
Creat: 1.21 mg/dL — ABNORMAL HIGH (ref 0.50–0.99)
GFR, EST NON AFRICAN AMERICAN: 47 mL/min/{1.73_m2} — AB (ref >=60)
GFR, Est African American: 54 mL/min/{1.73_m2} — ABNORMAL LOW (ref >=60)
Globulin: 3.2 g/dL (calc) (ref 1.9–3.7)
Glucose, Bld: 94 mg/dL (ref 65–99)
POTASSIUM: 4 mmol/L (ref 3.5–5.3)
Sodium: 140 mmol/L (ref 135–146)
Total Bilirubin: 0.4 mg/dL (ref 0.2–1.2)
Total Protein: 7.6 g/dL (ref 6.1–8.1)

## 2018-05-28 LAB — ANTI-NUCLEAR AB-TITER (ANA TITER): ANA Titer 1: 1:160 {titer} — ABNORMAL HIGH

## 2018-05-28 LAB — URINALYSIS, ROUTINE W REFLEX MICROSCOPIC
Bacteria, UA: NONE SEEN /HPF
Bilirubin Urine: NEGATIVE
Glucose, UA: NEGATIVE
Hgb urine dipstick: NEGATIVE
Hyaline Cast: NONE SEEN /LPF
KETONES UR: NEGATIVE
Nitrite: NEGATIVE
Protein, ur: NEGATIVE
RBC / HPF: NONE SEEN /HPF (ref 0–2)
SPECIFIC GRAVITY, URINE: 1.013 (ref 1.001–1.03)
pH: 7.5 (ref 5.0–8.0)

## 2018-05-28 LAB — SJOGRENS SYNDROME-B EXTRACTABLE NUCLEAR ANTIBODY: SSB (La) (ENA) Antibody, IgG: <1 AI

## 2018-05-28 LAB — SJOGRENS SYNDROME-A EXTRACTABLE NUCLEAR ANTIBODY: SSA (Ro) (ENA) Antibody, IgG: >8 AI — AB

## 2018-05-28 LAB — ANA: Anti Nuclear Antibody(ANA): POSITIVE — AB

## 2018-05-28 NOTE — Progress Notes (Signed)
ANA positive-1:160 NS.  Ro positive. La negative.

## 2018-05-29 ENCOUNTER — Telehealth: Payer: Self-pay | Admitting: *Deleted

## 2018-05-29 DIAGNOSIS — Z79899 Other long term (current) drug therapy: Secondary | ICD-10-CM

## 2018-05-29 NOTE — Telephone Encounter (Signed)
-----   Message from Ofilia Neas, PA-C sent at 05/28/2018  3:01 PM EDT ----- ANA positive-1:160 NS.  Ro positive. La negative.

## 2018-06-11 ENCOUNTER — Telehealth: Payer: Self-pay

## 2018-06-11 NOTE — Telephone Encounter (Signed)
   Primary Cardiologist:  DR Stanford Breed   Patient contacted.  History reviewed.  No symptoms to suggest any unstable cardiac conditions.  Based on discussion, with current pandemic situation, we will be postponing this appointment for Alexandria Leach with a plan for f/u in 12 wks or sooner if feasible/necessary.  If symptoms change, she has been instructed to contact our office.     Harold Hedge, CMA  06/11/2018 4:42 PM         .

## 2018-06-15 ENCOUNTER — Ambulatory Visit: Payer: Self-pay | Admitting: Family Medicine

## 2018-06-15 ENCOUNTER — Ambulatory Visit: Payer: Medicare Other | Admitting: Cardiology

## 2018-06-24 ENCOUNTER — Telehealth: Payer: Self-pay | Admitting: Rheumatology

## 2018-06-24 NOTE — Telephone Encounter (Signed)
Patient left a voicemail requesting prescription refill of Tramadol to be sent to [facility].  Patient states she is out of medication and requesting the prescription be called in today.

## 2018-06-24 NOTE — Telephone Encounter (Addendum)
Last Visit: 05/26/18 Next Visit: 10/27/18 UDS: 08/13/18 Narc Agreement: 12/04/17  Patient advised she is due to update UDS and narc agreement. Patient will update 06/25/18.   Okay to refill tramadol?

## 2018-06-25 ENCOUNTER — Other Ambulatory Visit: Payer: Self-pay | Admitting: *Deleted

## 2018-06-25 DIAGNOSIS — G8929 Other chronic pain: Secondary | ICD-10-CM

## 2018-06-25 DIAGNOSIS — Z5181 Encounter for therapeutic drug level monitoring: Secondary | ICD-10-CM | POA: Diagnosis not present

## 2018-06-27 LAB — PAIN MGMT, PROFILE 5 W/CONF, U
Alphahydroxyalprazolam: 420 ng/mL — ABNORMAL HIGH (ref <25)
Alphahydroxymidazolam: NEGATIVE ng/mL (ref <50)
Alphahydroxytriazolam: NEGATIVE ng/mL (ref <50)
Aminoclonazepam: NEGATIVE ng/mL (ref <25)
Amphetamines: NEGATIVE ng/mL (ref <500)
Barbiturates: NEGATIVE ng/mL (ref <300)
Benzodiazepines: POSITIVE ng/mL — AB (ref <100)
Cocaine Metabolite: NEGATIVE ng/mL (ref <150)
Creatinine: 67.7 mg/dL
Hydroxyethylflurazepam: NEGATIVE ng/mL (ref <50)
Lorazepam: NEGATIVE ng/mL (ref <50)
Marijuana Metabolite: NEGATIVE ng/mL (ref <20)
Methadone Metabolite: NEGATIVE ng/mL (ref <100)
Nordiazepam: NEGATIVE ng/mL (ref <50)
Opiates: NEGATIVE ng/mL (ref <100)
Oxazepam: NEGATIVE ng/mL (ref <50)
Oxidant: NEGATIVE ug/mL (ref <200)
Oxycodone: NEGATIVE ng/mL (ref <100)
Temazepam: NEGATIVE ng/mL (ref <50)
pH: 6.99 (ref 4.5–9.0)

## 2018-06-27 LAB — PAIN MGMT, TRAMADOL W/MEDMATCH, U
Desmethyltramadol: NEGATIVE ng/mL (ref <100)
Tramadol: NEGATIVE ng/mL (ref <100)

## 2018-06-30 ENCOUNTER — Other Ambulatory Visit: Payer: Self-pay | Admitting: Rheumatology

## 2018-06-30 NOTE — Telephone Encounter (Signed)
ok 

## 2018-06-30 NOTE — Progress Notes (Signed)
Test for benzodiazepine is positive as patient is on Xanax.  Tramadol was negative.  Please check if patient is filling tramadol only on PRN basis.

## 2018-07-01 MED ORDER — TRAMADOL HCL 50 MG PO TABS: 50.0000 mg | ORAL_TABLET | Freq: Three times a day (TID) | ORAL | 0 refills | Status: DC

## 2018-07-01 NOTE — Telephone Encounter (Signed)
Last Visit: 05/26/2018 Next Visit: 10/27/2018 UDS: 06/25/2018 tramadol negative, last tramadol fill: 09/03/2017 Narc Agreement: 06/25/2018 (not scanned in)  Last fill: 09/03/2017  Okay to refill tramadol?   Note from 05/26/2018 mentions interaction between lexapro and tramadol. And " She continues to gabapentin which helps with her pain as well as tramadol very sparingly."

## 2018-07-03 ENCOUNTER — Other Ambulatory Visit: Payer: Self-pay | Admitting: Family Medicine

## 2018-07-03 NOTE — Telephone Encounter (Signed)
Electronic refill request Lovastatin Last office visit 04/03/18 Last refill 11/13/17 #[phone]

## 2018-07-05 ENCOUNTER — Encounter: Payer: Self-pay | Admitting: Rheumatology

## 2018-07-13 ENCOUNTER — Ambulatory Visit: Payer: Medicare Other | Admitting: Cardiology

## 2018-07-16 ENCOUNTER — Other Ambulatory Visit: Payer: Self-pay | Admitting: Family Medicine

## 2018-07-16 ENCOUNTER — Ambulatory Visit (INDEPENDENT_AMBULATORY_CARE_PROVIDER_SITE_OTHER): Payer: Medicare Other | Admitting: Family Medicine

## 2018-07-16 ENCOUNTER — Other Ambulatory Visit: Payer: Self-pay

## 2018-07-16 ENCOUNTER — Encounter: Payer: Self-pay | Admitting: Family Medicine

## 2018-07-16 ENCOUNTER — Other Ambulatory Visit: Payer: Medicare Other

## 2018-07-16 VITALS — BP 145/78 | HR 113 | Ht 63.0 in | Wt 194.1 lb

## 2018-07-16 DIAGNOSIS — R109 Unspecified abdominal pain: Secondary | ICD-10-CM | POA: Diagnosis not present

## 2018-07-16 DIAGNOSIS — R10A1 Flank pain, right side: Secondary | ICD-10-CM | POA: Insufficient documentation

## 2018-07-16 DIAGNOSIS — M3500 Sicca syndrome, unspecified: Secondary | ICD-10-CM | POA: Diagnosis not present

## 2018-07-16 DIAGNOSIS — M797 Fibromyalgia: Secondary | ICD-10-CM | POA: Diagnosis not present

## 2018-07-16 LAB — COMPREHENSIVE METABOLIC PANEL
ALT: 17 U/L (ref 0–35)
AST: 27 U/L (ref 0–37)
Albumin: 4.1 g/dL (ref 3.5–5.2)
Alkaline Phosphatase: 71 U/L (ref 39–117)
BUN: 8 mg/dL (ref 6–23)
CO2: 26 mEq/L (ref 19–32)
Calcium: 9.6 mg/dL (ref 8.4–10.5)
Chloride: 105 mEq/L (ref 96–112)
Creatinine, Ser: 1.29 mg/dL — ABNORMAL HIGH (ref 0.40–1.20)
GFR: 50.03 mL/min — ABNORMAL LOW (ref >60.00)
Glucose, Bld: 99 mg/dL (ref 70–99)
Potassium: 4.2 mEq/L (ref 3.5–5.1)
Sodium: 139 mEq/L (ref 135–145)
Total Bilirubin: 0.5 mg/dL (ref 0.2–1.2)
Total Protein: 7.6 g/dL (ref 6.0–8.3)

## 2018-07-16 LAB — POCT URINALYSIS DIPSTICK
Bilirubin, UA: NEGATIVE
Blood, UA: NEGATIVE
Glucose, UA: NEGATIVE
Ketones, UA: NEGATIVE
Nitrite, UA: NEGATIVE
Protein, UA: NEGATIVE
Spec Grav, UA: 1.01 (ref 1.010–1.025)
Urobilinogen, UA: 1 E.U./dL
pH, UA: 7.5 (ref 5.0–8.0)

## 2018-07-16 LAB — CBC WITH DIFFERENTIAL/PLATELET
Basophils Absolute: 0 10*3/uL (ref 0.0–0.1)
Basophils Relative: 0.9 % (ref 0.0–3.0)
Eosinophils Absolute: 0.2 10*3/uL (ref 0.0–0.7)
Eosinophils Relative: 3.3 % (ref 0.0–5.0)
HCT: 37 % (ref 36.0–46.0)
Hemoglobin: 12.1 g/dL (ref 12.0–15.0)
Lymphocytes Relative: 28.9 % (ref 12.0–46.0)
Lymphs Abs: 1.3 10*3/uL (ref 0.7–4.0)
MCHC: 32.7 g/dL (ref 30.0–36.0)
MCV: 83.9 fl (ref 78.0–100.0)
Monocytes Absolute: 0.6 10*3/uL (ref 0.1–1.0)
Monocytes Relative: 13.2 % — ABNORMAL HIGH (ref 3.0–12.0)
Neutro Abs: 2.5 10*3/uL (ref 1.4–7.7)
Neutrophils Relative %: 53.7 % (ref 43.0–77.0)
Platelets: 237 10*3/uL (ref 150.0–400.0)
RBC: 4.41 Mil/uL (ref 3.87–5.11)
RDW: 14.6 % (ref 11.5–15.5)
WBC: 4.7 10*3/uL (ref 4.0–10.5)

## 2018-07-16 LAB — CK: Total CK: 192 U/L — ABNORMAL HIGH (ref 7–177)

## 2018-07-16 MED ORDER — PREDNISONE 10 MG PO TABS: ORAL_TABLET | ORAL | 0 refills | Status: DC

## 2018-07-16 NOTE — Progress Notes (Addendum)
Virtual visit completed through [employer]. Due to national recommendations of social distancing due to [place] 19, a virtual visit is felt to be most appropriate for this patient at this time.   Patient location: home Provider location: [place] [facility], office If any vitals were documented, they were collected by patient at home unless specified below.    BP (!) 145/78 (BP Location: Left Arm, Patient Position: Sitting, Cuff Size: Normal)   Pulse (!) 113   Ht 5\' 3"  (1.6 m)   Wt 194 lb 2 oz (88.1 kg)   BMI 34.39 kg/m    CC: flank pain Subjective:    Patient ID: Alexandria Leach, female    DOB: [birth year], 68 y.o.   MRN: [id number]  HPI: [name] is a 68 y.o. female presenting on 07/16/2018 for Flank Pain (C/o right flank pain radiating around her back to the left side. Pain is off and on. Started last week. Tried old tizanidine rx, Voltaren gel and heating pad, not helpful. )   1+ wk h/o R side pain that starts under breast at ribcage and travels down side into lower back to the left side. Describes muscle spasm/cramping that goes down the buttock. Progressively worsening. No new rashes on skin. Positional back pain, but no reproducible to palpation.   No fevers/chills, nausea/vomiting, diarrhea/constipation, blood in stool, urinary changes (dysuria, urgency, frequency, hematuria). No gassiness/indigestion or bloating, GERD symptoms.   Endorses h/o kidney stones - this feels different.   So far has tried tramadol, voltaren cream, tizanidine 4mg , heating pad without benefit.      Relevant past medical, surgical, family and social history reviewed and updated as indicated. Interim medical history since our last visit reviewed. Allergies and medications reviewed and updated. Outpatient Medications Prior to Visit  Medication Sig Dispense Refill  . ALPRAZolam (XANAX) 0.5 MG tablet Take 1 tablet (0.5 mg total) by mouth 2 (two) times daily. 180 tablet 2  . amitriptyline  (ELAVIL) 25 MG tablet Take 1 tablet (25 mg total) by mouth at bedtime. 90 tablet 2  . antiseptic oral rinse (BIOTENE) LIQD 15 mLs by Mouth Rinse route at bedtime.     Marland Kitchen aspirin 325 MG EC tablet Take 325 mg by mouth daily.     . cholecalciferol (VITAMIN D) 1000 UNITS tablet Take 1,000 Units by mouth daily.     Marland Kitchen Co-Enzyme Q-10 100 MG CAPS Take 1 capsule (100 mg total) by mouth daily.  0  . diclofenac sodium (VOLTAREN) 1 % GEL Apply 1 application topically 2 (two) times daily as needed (pain).    Marland Kitchen diltiazem (CARDIZEM CD) 240 MG 24 hr capsule Take 1 capsule (240 mg total) by mouth daily. 90 capsule 3  . escitalopram (LEXAPRO) 20 MG tablet Take 1 tablet (20 mg total) by mouth 2 (two) times daily. 180 tablet 2  . ezetimibe (ZETIA) 10 MG tablet TAKE 1 TABLET BY MOUTH AT  BEDTIME 90 tablet 2  . famotidine (PEPCID) 20 MG tablet TAKE 1 TABLET BY MOUTH TWO  TIMES DAILY 180 tablet 2  . furosemide (LASIX) 20 MG tablet TAKE 1 TABLET BY MOUTH  DAILY AS NEEDED FOR EDEMA 90 tablet 1  . gabapentin (NEURONTIN) 300 MG capsule 300mg  in am, 300mg  in afternoon and 600mg  at night time 360 capsule 1  . hydrochlorothiazide (MICROZIDE) 12.5 MG capsule TAKE 1 CAPSULE BY MOUTH  DAILY 90 capsule 3  . hydroxychloroquine (PLAQUENIL) 200 MG tablet Take 1 tablet (200 mg total) by mouth  2 (two) times daily. 180 tablet 0  . lovastatin (MEVACOR) 10 MG tablet TAKE 1 TABLET BY MOUTH  EVERY MONDAY, WEDNESDAY,  AND FRIDAY 40 tablet 3  . metoprolol tartrate (LOPRESSOR) 25 MG tablet Take 1 tablet (25 mg total) by mouth 2 (two) times daily. 180 tablet 3  . Polyethyl Glycol-Propyl Glycol (SYSTANE) 0.4-0.3 % GEL Place 1 drop into both eyes 2 (two) times daily.     . RESTASIS 0.05 % ophthalmic emulsion INSTILL ONE DROP IN BOTH  EYES TWO TIMES DAILY 180 each 0  . traMADol (ULTRAM) 50 MG tablet Take 1 tablet (50 mg total) by mouth 3 (three) times daily. 90 tablet 0  . tiZANidine (ZANAFLEX) 4 MG tablet Take 1 tablet (4 mg total) by mouth every  8 (eight) hours as needed for muscle spasms.     No facility-administered medications prior to visit.      Per HPI unless specifically indicated in ROS section below Review of Systems Objective:    BP (!) 145/78 (BP Location: Left Arm, Patient Position: Sitting, Cuff Size: Normal)   Pulse (!) 113   Ht 5\' 3"  (1.6 m)   Wt 194 lb 2 oz (88.1 kg)   BMI 34.39 kg/m   Wt Readings from Last 3 Encounters:  07/16/18 194 lb 2 oz (88.1 kg)  05/27/18 191 lb (86.6 kg)  05/26/18 198 lb 3.2 oz (89.9 kg)     Physical exam: Gen: alert, NAD, not ill appearing. In her bed.  Pulm: speaks in complete sentences without increased work of breathing Psych: normal mood, normal thought content      Results for orders placed or performed in visit on 07/16/18  Urinalysis Dipstick  Result Value Ref Range   Color, UA yellow    Clarity, UA clear    Glucose, UA Negative Negative   Bilirubin, UA negative    Ketones, UA negative    Spec Grav, UA 1.010 1.010 - 1.025   Blood, UA negative    pH, UA 7.5 5.0 - 8.0   Protein, UA Negative Negative   Urobilinogen, UA 1.0 0.2 or 1.0 E.U./dL   Nitrite, UA negative    Leukocytes, UA Large (3+) (A) Negative   Appearance     Odor      Assessment & Plan:   Problem List Items Addressed This Visit    Sjogren's syndrome (HCC)   Fibromyalgia   Relevant Medications   tiZANidine (ZANAFLEX) 4 MG tablet   Acute right flank pain - Primary    Recently worsening pain described as muscle cramp/spasm not improved with tramadol, tizanidine, voltaren gel or heating pad. Will ask her to come in for labwork to help r/o other cause like kidney stone, liver dysfunction. Advised to monitor for vesicular rash. Consider changing muscle relaxant vs treating with steroid course pending lab/UA results.  Urine microscopy not suspicious for infection or kidney stone. UCx not sent.       Relevant Orders   Comprehensive metabolic panel   CBC with Differential/Platelet   Urinalysis  Dipstick (Completed)   CK       No orders of the defined types were placed in this encounter.  Orders Placed This Encounter  Procedures  . Comprehensive metabolic panel    Standing Status:   Future    Number of Occurrences:   1    Standing Expiration Date:   07/16/2019  . CBC with Differential/Platelet    Standing Status:   Future    Number of Occurrences:  1    Standing Expiration Date:   07/16/2019  . CK    Standing Status:   Future    Number of Occurrences:   1    Standing Expiration Date:   07/16/2019  . Urinalysis Dipstick    Standing Status:   Future    Number of Occurrences:   1    Standing Expiration Date:   08/15/2018    Follow up plan: No follow-ups on file.  Ria Bush, MD

## 2018-07-16 NOTE — Assessment & Plan Note (Addendum)
Recently worsening pain described as muscle cramp/spasm not improved with tramadol, tizanidine, voltaren gel or heating pad. Will ask her to come in for labwork to help r/o other cause like kidney stone, liver dysfunction. Advised to monitor for vesicular rash. Consider changing muscle relaxant vs treating with steroid course pending lab/UA results.  Urine microscopy not suspicious for infection or kidney stone. UCx not sent.

## 2018-07-16 NOTE — Addendum Note (Signed)
Addended by: Ellamae Sia on: 07/16/2018 10:07 AM   Modules accepted: Orders

## 2018-07-17 ENCOUNTER — Encounter: Payer: Self-pay | Admitting: Family Medicine

## 2018-07-27 ENCOUNTER — Other Ambulatory Visit: Payer: Self-pay | Admitting: Physician Assistant

## 2018-07-27 DIAGNOSIS — M3501 Sicca syndrome with keratoconjunctivitis: Secondary | ICD-10-CM

## 2018-07-27 NOTE — Telephone Encounter (Signed)
Last Visit: 05/26/2018 Next Visit: 10/27/2018 Labs: 07/16/18 Creat 1.29 GFR 50.03 Monocytes Relative 13.2  No baseline PLQ eye exam on file Patient states she has her PLQ eye exam schedule for this month.   Okay to refill PLQ?

## 2018-07-27 NOTE — Telephone Encounter (Signed)
OK 

## 2018-08-03 ENCOUNTER — Ambulatory Visit (HOSPITAL_COMMUNITY): Payer: Medicare Other | Admitting: Psychiatry

## 2018-08-03 ENCOUNTER — Other Ambulatory Visit: Payer: Self-pay

## 2018-08-03 DIAGNOSIS — M3210 Systemic lupus erythematosus, organ or system involvement unspecified: Secondary | ICD-10-CM | POA: Diagnosis not present

## 2018-08-07 ENCOUNTER — Encounter (HOSPITAL_COMMUNITY): Payer: Self-pay | Admitting: Emergency Medicine

## 2018-08-07 ENCOUNTER — Ambulatory Visit (HOSPITAL_COMMUNITY)
Admission: EM | Admit: 2018-08-07 | Discharge: 2018-08-07 | Disposition: A | Discharge status: Home / Self Care | Payer: Medicare Other | Attending: Internal Medicine | Admitting: Internal Medicine

## 2018-08-07 ENCOUNTER — Other Ambulatory Visit: Payer: Self-pay

## 2018-08-07 DIAGNOSIS — M545 Low back pain, unspecified: Secondary | ICD-10-CM

## 2018-08-07 DIAGNOSIS — R300 Dysuria: Secondary | ICD-10-CM | POA: Insufficient documentation

## 2018-08-07 DIAGNOSIS — E860 Dehydration: Secondary | ICD-10-CM | POA: Insufficient documentation

## 2018-08-07 LAB — BASIC METABOLIC PANEL
Anion gap: 11 (ref 5–15)
BUN: 6 mg/dL — ABNORMAL LOW (ref 8–23)
CO2: 22 mmol/L (ref 22–32)
Calcium: 9.5 mg/dL (ref 8.9–10.3)
Chloride: 102 mmol/L (ref 98–111)
Creatinine, Ser: 1.36 mg/dL — ABNORMAL HIGH (ref 0.44–1.00)
GFR calc Af Amer: 47 mL/min — ABNORMAL LOW (ref >60)
GFR calc non Af Amer: 40 mL/min — ABNORMAL LOW (ref >60)
Glucose, Bld: 89 mg/dL (ref 70–99)
Potassium: 3.6 mmol/L (ref 3.5–5.1)
Sodium: 135 mmol/L (ref 135–145)

## 2018-08-07 LAB — POCT URINALYSIS DIP (DEVICE)
Bilirubin Urine: NEGATIVE
Glucose, UA: NEGATIVE mg/dL
Hgb urine dipstick: NEGATIVE
Ketones, ur: NEGATIVE mg/dL
Nitrite: NEGATIVE
Protein, ur: NEGATIVE mg/dL
Specific Gravity, Urine: >=1.03 (ref 1.005–1.030)
Urobilinogen, UA: 0.2 mg/dL (ref 0.0–1.0)
pH: 5.5 (ref 5.0–8.0)

## 2018-08-07 NOTE — ED Provider Notes (Signed)
[place]    CSN: [id number] Arrival date & time: 08/07/18  1154     History   Chief Complaint Chief Complaint  Patient presents with  . Dysuria  . Back Pain    HPI Alexandria Leach is a 68 y.o. female.   Alexandria Leach presents with bilateral low back, R>L which radiates across back and now wraps around bilateral low abdomen. Difficulty with urine output, states she sometimes feels she needs to void but sits on the toilet without output. Denies any dysuria or frequency of urination. No blood in urine. Back pain started 5/13. Urinary symptoms started approximately 5/18. No fevers. Has taken lasix three times this week for edema, which has helped. She feels she may have had similar symptoms in the past but is uncertain. Hasn't taken any medications for her symptoms. States when she drinks water she is then able to urinate. It appears she had similar back in November which was determined to be dehydration. Had labs drawn with her PCP 4/30 for flank pain at that time as well. Hx of CKD, CHF, ddd, kidney stone history, hld, htn, fibromyalgia, OA,PE, back pain.     ROS per HPI, negative if not otherwise mentioned.      Past Medical History:  Diagnosis Date  . Ankle fracture, left 02/19/2015   from a fall  . Anxiety   . Cataract 2014   corrected with surgery  . CHF (congestive heart failure) ([place])   . CKD (chronic kidney disease) stage 3, GFR 30-59 ml/min [facility])    saw nephrologist Dr Harden Mo  . DDD (degenerative disc disease), cervical   . Depression   . Fibromyalgia   . Glaucoma    s/p surgery, sees ophtho Q6 mo  . 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 History of kidney stones 2010  . History of pulmonary embolism 2001, 2006   completed coumadin courses  . History of rheumatic fever x3  . HLD (hyperlipidemia)   . HTN (hypertension)   . Insomnia   . Lung nodule 09/22/2012   RLL - 69mm,  stable since 2014. Thought benign.   . Osteoarthritis    shoulders and knees, not RA per Dr Estanislado Pandy, positive ANA, positive Ro  . Osteopenia 09/18/2015   DEXA T -1.1 hip, -0.2 spine 08/2015   . Personal history of urinary calculi latest 2014  . Pneumonia 12/02/2011  . PONV (postoperative nausea and vomiting)   . Refusal of blood transfusions as patient is Jehovah's Witness   . Rheumatic heart disease 1980   s/p mitral valve repair 1980  . Sjogren's syndrome [facility])     Patient Active Problem List   Diagnosis Date Noted  . Acute right flank pain 07/16/2018  . Sinus tachycardia 05/27/2018  . Anxiety and depression 05/27/2018  . Transient alteration of awareness 04/04/2018  . Abnormal serum thyroid stimulating hormone (TSH) level 01/30/2018  . 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[place]) 12/22/2017  . Cough 11/06/2017  . Thyroid nodule 09/16/2017  . Sialoadenitis of submandibular gland 10/29/2016  . High risk medication use 06/05/2016  . Peripheral neuropathy 01/30/2016  . DNR no code (do not resuscitate) 12/08/2015  . 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 TIA (transient ischemic attack) 10/12/2015  . Mild mitral regurgitation 10/12/2015  . Osteopenia 09/18/2015  . Right arm pain 08/29/2015  . History of fall   . Trimalleolar fracture of left ankle 02/23/2015  . Right  sided sciatica 02/21/2015  . Imbalance 01/12/2015  . Transaminitis 12/24/2014  . DDD (degenerative disc disease), cervical   . Health maintenance examination 10/18/2014  . Advanced care planning/counseling discussion 10/18/2014  . Medicare annual wellness visit, initial 10/18/2014  . Refusal of blood transfusions as patient is Jehovah's Witness   . Sjogren's syndrome ([place])   . CKD (chronic kidney disease) stage 3, GFR 30-59 ml/min (HCC)   . Glaucoma   . Fibromyalgia   . Osteoarthritis   . History of pulmonary embolism   . Lung nodule 09/22/2012  . Diastolic CHF ([place]) 83/41/9622  . Aortic stenosis  04/22/2011  . Palpitations 08/10/2010  . Dyspnea 08/10/2010  . HOT FLASHES 06/28/2008  . Trochanteric bursitis of right hip 06/28/2008  . ARTHRALGIA 07/01/2007  . BACK PAIN, LEFT 08/25/2006  . GAD (generalized anxiety disorder) 03/28/2006  . History of mitral valve repair 03/28/2006  . TAH/BSO, HX OF 03/28/2006  . HYPERCHOLESTEROLEMIA 12/31/2005  . MDD (major depressive disorder), recurrent episode ([place]) 12/31/2005  . RHEUMATIC HEART DISEASE 12/31/2005  . Essential hypertension 12/31/2005  . Chronic insomnia 12/31/2005    Past Surgical History:  Procedure Laterality Date  . BREAST BIOPSY Right 2006   benign  . CHOLECYSTECTOMY  11/27/2011   Procedure: LAPAROSCOPIC CHOLECYSTECTOMY WITH INTRAOPERATIVE CHOLANGIOGRAM;  Surgeon: Adin Hector, MD;  Location: [place];  Service: General;  Laterality: N/A;  laparoscopic cholecystectomy with choleangiogram umbilical hernia repair  . COLONOSCOPY  07/2014   WNL [facility])  . dexa  08/2012   normal per patient - no records available  . EYE SURGERY Bilateral 2014   cataract removal  . MITRAL VALVE REPAIR  1980   open heart  . ORIF ANKLE FRACTURE Left 02/26/2015   Procedure: OPEN REDUCTION INTERNAL FIXATION (ORIF) LEFT TRIMALLEOLAR ANKLE FRACTURE;  Surgeon: [name], MD;  Location: [place];  Service: Orthopedics;  Laterality: Left;  . TUBAL LIGATION  1980  . UMBILICAL HERNIA REPAIR  11/27/2011   Procedure: HERNIA REPAIR UMBILICAL ADULT;  Surgeon: Adin Hector, MD;  Location: [place];  Service: General;  Laterality: N/A;  . [place]   for fibroids -- partial, ovaries remain    OB History   No obstetric history on file.      Home Medications    Prior to Admission medications   Medication Sig Start Date End Date Taking? Authorizing Provider  ALPRAZolam Duanne Moron) 0.5 MG tablet Take 1 tablet (0.5 mg total) by mouth 2 (two) times daily. 05/04/18 05/04/19  Cloria Spring, MD  amitriptyline (ELAVIL) 25 MG tablet Take 1 tablet  (25 mg total) by mouth at bedtime. 05/04/18   Cloria Spring, MD  antiseptic oral rinse (BIOTENE) LIQD 15 mLs by Mouth Rinse route at bedtime.     [provider]  aspirin 325 MG EC tablet Take 325 mg by mouth daily.     [provider]  cholecalciferol (VITAMIN D) 1000 UNITS tablet Take 1,000 Units by mouth daily.     [provider]  Co-Enzyme Q-10 100 MG CAPS Take 1 capsule (100 mg total) by mouth daily. 12/15/17   Ria Bush, MD  diclofenac sodium (VOLTAREN) 1 % GEL Apply 1 application topically 2 (two) times daily as needed (pain).    [provider]  diltiazem (CARDIZEM CD) 240 MG 24 hr capsule Take 1 capsule (240 mg total) by mouth daily. 05/06/18 08/04/18  Lelon Perla, MD  escitalopram (LEXAPRO) 20 MG tablet Take 1 tablet (20  mg total) by mouth 2 (two) times daily. 05/04/18   Cloria Spring, MD  ezetimibe (ZETIA) 10 MG tablet TAKE 1 TABLET BY MOUTH AT  BEDTIME 04/24/18   Ria Bush, MD  famotidine (PEPCID) 20 MG tablet TAKE 1 TABLET BY MOUTH TWO  TIMES DAILY 04/16/18   Ria Bush, MD  furosemide (LASIX) 20 MG tablet TAKE 1 TABLET BY MOUTH  DAILY AS NEEDED FOR EDEMA 08/08/17   Ria Bush, MD  gabapentin (NEURONTIN) 300 MG capsule 300mg  in am, 300mg  in afternoon and 600mg  at night time 04/03/18   Ria Bush, MD  hydrochlorothiazide (MICROZIDE) 12.5 MG capsule TAKE 1 CAPSULE BY MOUTH  DAILY 02/10/17   Ria Bush, MD  hydroxychloroquine (PLAQUENIL) 200 MG tablet TAKE 1 TABLET BY MOUTH TWO  TIMES DAILY 07/27/18   Bo Merino, MD  lovastatin (MEVACOR) 10 MG tablet TAKE 1 TABLET BY MOUTH  EVERY MONDAY, [place],  AND FRIDAY 07/06/18   Ria Bush, MD  metoprolol tartrate (LOPRESSOR) 25 MG tablet Take 1 tablet (25 mg total) by mouth 2 (two) times daily. 05/27/18 08/25/18  Erlene Quan, PA-C  Polyethyl Glycol-Propyl Glycol (SYSTANE) 0.4-0.3 % GEL Place 1 drop into both eyes 2 (two) times daily.     [provider]  predniSONE (DELTASONE) 10 MG tablet Take three tablets for 3 days followed by two tablets for 3 days followed by one tablet for 3 days 07/16/18   Ria Bush, MD  RESTASIS 0.05 % ophthalmic emulsion INSTILL ONE DROP IN BOTH  EYES TWO TIMES DAILY 12/02/16   Ria Bush, MD  tiZANidine (ZANAFLEX) 4 MG tablet Take 1 tablet (4 mg total) by mouth every 8 (eight) hours as needed for muscle spasms. 07/16/18   Ria Bush, MD  traMADol (ULTRAM) 50 MG tablet Take 1 tablet (50 mg total) by mouth 3 (three) times daily. 07/01/18   Ofilia Neas, PA-C    Family History Family History  Problem Relation Age of Onset  . Lupus Sister        and niece  . Cancer Mother        lung (nonsmoker)  . CAD Mother        MI in her 66s  . ALS Mother   . Kidney disease Father   . Alcohol abuse Father   . Diabetes Father   . Cancer Brother        bone  . Diabetes Sister   . Stroke Sister   . Cancer Maternal Uncle        bone  . Depression Sister   . Kidney failure Other        on HD  . Diabetes Brother   . Heart attack Brother   . Stroke Maternal Grandmother     Social History Social History   Tobacco Use  . Smoking status: Never Smoker  . Smokeless tobacco: Never Used  Substance Use Topics  . Alcohol use: No    Alcohol/week: 0.0 standard drinks  . Drug use: No     Allergies   Cymbalta [duloxetine hcl]; Statins; and Sulfa antibiotics   Review of Systems Review of Systems   Physical Exam Triage Vital Signs ED Triage Vitals  Enc Vitals Group     BP 08/07/18 1223 129/64     Pulse Rate 08/07/18 1223 80     Resp 08/07/18 1223 16     Temp 08/07/18 1223 98.4 F (36.9 C)     Temp Source 08/07/18 1223 Oral  SpO2 08/07/18 1223 96 %     Weight --      Height --      Head Circumference --      Peak Flow --      Pain Score 08/07/18 1221 5     Pain Loc --      Pain Edu? --      Excl. in [place]? --    No data found.  Updated Vital Signs BP 129/64 (BP  Location: Left Arm)   Pulse 80   Temp 98.4 F (36.9 C) (Oral)   Resp 16   SpO2 96%    Physical Exam Constitutional:      General: She is not in acute distress.    Appearance: She is well-developed.  Cardiovascular:     Rate and Rhythm: Normal rate and regular rhythm.     Heart sounds: Normal heart sounds.  Pulmonary:     Effort: Pulmonary effort is normal.     Breath sounds: Normal breath sounds.  Abdominal:     Tenderness: There is abdominal tenderness in the right lower quadrant and left lower quadrant. There is no right CVA tenderness, left CVA tenderness, guarding or rebound.  Musculoskeletal:     Lumbar back: She exhibits tenderness and pain. She exhibits no bony tenderness, no spasm and normal pulse.  Skin:    General: Skin is warm and dry.  Neurological:     Mental Status: She is alert and oriented to person, place, and time.      UC Treatments / Results  Labs (all labs ordered are listed, but only abnormal results are displayed) Labs Reviewed  BASIC METABOLIC PANEL - Abnormal; Notable for the following components:      Result Value   BUN 6 (*)    Creatinine, Ser 1.36 (*)    GFR calc non Af Amer 40 (*)    GFR calc Af Amer 47 (*)    All other components within normal limits  POCT URINALYSIS DIP (DEVICE) - Abnormal; Notable for the following components:   Leukocytes,Ua TRACE (*)    All other components within normal limits  URINE CULTURE    EKG None  Radiology No results found.  Procedures Procedures (including critical care time)  Medications Ordered in UC Medications - No data to display  Initial Impression / Assessment and Plan / UC Course  I have reviewed the triage vital signs and the nursing notes.  Pertinent labs & imaging results that were available during my care of the patient were reviewed by me and considered in my medical decision making (see chart for details).     Creatinine further elevated today, with specific gravity of urine  elevated as well. Appears consistent with dehydration. Has taken lasix three times this week. Encouraged increased water intake. Follow up with PCP for recheck. Er if any worsening of symptoms. Patient verbalized understanding and agreeable to plan.    Final Clinical Impressions(s) / UC Diagnoses   Final diagnoses:  Right-sided low back pain without sciatica, unspecified chronicity  Dehydration     Discharge Instructions     Your urine is concerning for dehydration.  I will call you if your labs return concerning for worsening of kidney function. I would like you to increase your water intake to improve your urine output.  Please follow up with your PCP and/or Urologist in the next few weeks for recheck as needed for persistent symptoms.  Heat, tylenol, light activity to help with back pain.  If any worsening of symptoms, increased pain, unable to urinate, blood in urine, fevers, or otherwise worsening please go to the ER.    ED Prescriptions    None     Controlled Substance Prescriptions [place] Controlled Substance Registry consulted? Not Applicable   Zigmund Gottron, NP 08/07/18 [phone]

## 2018-08-07 NOTE — ED Triage Notes (Signed)
Pt presents to [facility] for assessment of lower back pain radiating to the groin bilaterally, and difficulty starting her stream x 1 week.

## 2018-08-07 NOTE — Discharge Instructions (Addendum)
Your urine is concerning for dehydration.  I will call you if your labs return concerning for worsening of kidney function. I would like you to increase your water intake to improve your urine output.  Please follow up with your PCP and/or Urologist in the next few weeks for recheck as needed for persistent symptoms.  Heat, tylenol, light activity to help with back pain.  If any worsening of symptoms, increased pain, unable to urinate, blood in urine, fevers, or otherwise worsening please go to the ER.

## 2018-08-08 LAB — URINE CULTURE: Culture: NO GROWTH

## 2018-08-09 ENCOUNTER — Other Ambulatory Visit: Payer: Self-pay | Admitting: Family Medicine

## 2018-08-10 ENCOUNTER — Other Ambulatory Visit: Payer: Self-pay | Admitting: Physician Assistant

## 2018-08-10 ENCOUNTER — Other Ambulatory Visit (HOSPITAL_COMMUNITY): Payer: Self-pay | Admitting: Psychiatry

## 2018-08-11 NOTE — Telephone Encounter (Signed)
Last Visit: 05/26/2018 Next Visit: 10/27/2018 UDS: 06/25/2018  Narc Agreement: 06/25/2018   Okay to refill Tramadol?

## 2018-08-12 MED ORDER — TRAMADOL HCL 50 MG PO TABS: 50.0000 mg | ORAL_TABLET | Freq: Three times a day (TID) | ORAL | 0 refills | Status: DC

## 2018-08-25 ENCOUNTER — Encounter (HOSPITAL_COMMUNITY): Payer: Self-pay | Admitting: Psychiatry

## 2018-08-25 ENCOUNTER — Encounter: Payer: Self-pay | Admitting: Family Medicine

## 2018-08-25 ENCOUNTER — Telehealth (INDEPENDENT_AMBULATORY_CARE_PROVIDER_SITE_OTHER): Payer: Medicare Other | Admitting: Family Medicine

## 2018-08-25 ENCOUNTER — Ambulatory Visit (INDEPENDENT_AMBULATORY_CARE_PROVIDER_SITE_OTHER): Payer: Medicare Other | Admitting: Psychiatry

## 2018-08-25 ENCOUNTER — Other Ambulatory Visit: Payer: Self-pay

## 2018-08-25 ENCOUNTER — Ambulatory Visit: Payer: Medicare Other | Admitting: Family Medicine

## 2018-08-25 VITALS — BP 151/76 | HR 80 | Wt 189.2 lb

## 2018-08-25 DIAGNOSIS — I10 Essential (primary) hypertension: Secondary | ICD-10-CM

## 2018-08-25 DIAGNOSIS — M3500 Sicca syndrome, unspecified: Secondary | ICD-10-CM | POA: Diagnosis not present

## 2018-08-25 DIAGNOSIS — M797 Fibromyalgia: Secondary | ICD-10-CM

## 2018-08-25 DIAGNOSIS — N183 Chronic kidney disease, stage 3 unspecified: Secondary | ICD-10-CM

## 2018-08-25 DIAGNOSIS — F331 Major depressive disorder, recurrent, moderate: Secondary | ICD-10-CM

## 2018-08-25 MED ORDER — ALPRAZOLAM 0.5 MG PO TABS: 0.5000 mg | ORAL_TABLET | Freq: Two times a day (BID) | ORAL | 2 refills | Status: DC

## 2018-08-25 MED ORDER — METOPROLOL TARTRATE 50 MG PO TABS: 50.0000 mg | ORAL_TABLET | Freq: Two times a day (BID) | ORAL | 1 refills | Status: DC

## 2018-08-25 MED ORDER — AMITRIPTYLINE HCL 25 MG PO TABS: 25.0000 mg | ORAL_TABLET | Freq: Every day | ORAL | 2 refills | Status: DC

## 2018-08-25 MED ORDER — ESCITALOPRAM OXALATE 20 MG PO TABS: 20.0000 mg | ORAL_TABLET | Freq: Two times a day (BID) | ORAL | 2 refills | Status: DC

## 2018-08-25 NOTE — Assessment & Plan Note (Signed)
BP elevated - will increase metoprolol to 50mg  bid. Continue other antihypertensive regimen.

## 2018-08-25 NOTE — Assessment & Plan Note (Signed)
Followed by rheum. 

## 2018-08-25 NOTE — Progress Notes (Signed)
Virtual visit completed through [facility]. Due to national recommendations of social distancing due to COVID-19, a virtual visit is felt to be most appropriate for this patient at this time. Reviewed limitations of a virtual visit.   Patient location: home Provider location: [place] [facility], office If any vitals were documented, they were collected by patient at home unless specified below.    BP (!) 151/76   Pulse 80   Wt 189 lb 4 oz (85.8 kg)   BMI 33.52 kg/m   Denies fevers.   CC: follow up visit 6 month Subjective:    Patient ID: Alexandria Leach, female    DOB: 1950/04/25, 68 y.o.   MRN: [id number]  HPI: Alexandria Leach is a 68 y.o. female presenting on 08/25/2018 for Follow-up    Seen at ER 5/22 with lower back pain, note reviewed. Thought to be somewhat dehydrated after lasix use that week. Cr was mildly elevated at that time.   HTN - Compliant with current antihypertensive regimen of diltiazem 240mg  daily, metoprolol 25mg  bid, lasix 20mg  PRN - took several last week due to fluid retention with benefit. Does check blood pressures at home: 782-956 systolic.  No low blood pressure readings or symptoms of dizziness/syncope.  Denies HA, vision changes, CP/tightness, SOB.  Stays well hydrated but sweats a lot.   Notes increasing bilateral thigh pains, asks about returning to gabapentin 300mg  2/2/2 TID. Denies numbness or weakness of legs.   Saw cardiology 05/2018 with change in BP regimen to diltiazem 240mg  daily and lopressor 25mg  bid.   Saw rheum 05/2018 for sjogren's with KC sicca and fibromyalgia, started on plaquenil 200mg  bid.   Continues seeing psych Dr Harrington Challenger for depression/anxiety.      Relevant past medical, surgical, family and social history reviewed and updated as indicated. Interim medical history since our last visit reviewed. Allergies and medications reviewed and updated. Outpatient Medications Prior to Visit  Medication Sig Dispense Refill  . ALPRAZolam  (XANAX) 0.5 MG tablet Take 1 tablet (0.5 mg total) by mouth 2 (two) times daily. 180 tablet 2  . amitriptyline (ELAVIL) 25 MG tablet Take 1 tablet (25 mg total) by mouth at bedtime. 90 tablet 2  . antiseptic oral rinse (BIOTENE) LIQD 15 mLs by Mouth Rinse route at bedtime.     Marland Kitchen aspirin 325 MG EC tablet Take 325 mg by mouth daily.     . cholecalciferol (VITAMIN D) 1000 UNITS tablet Take 1,000 Units by mouth daily.     Marland Kitchen Co-Enzyme Q-10 100 MG CAPS Take 1 capsule (100 mg total) by mouth daily.  0  . diclofenac sodium (VOLTAREN) 1 % GEL Apply 1 application topically 2 (two) times daily as needed (pain).    Marland Kitchen diltiazem (CARDIZEM CD) 240 MG 24 hr capsule Take 1 capsule (240 mg total) by mouth daily. 90 capsule 3  . escitalopram (LEXAPRO) 20 MG tablet Take 1 tablet (20 mg total) by mouth 2 (two) times daily. 180 tablet 2  . ezetimibe (ZETIA) 10 MG tablet TAKE 1 TABLET BY MOUTH AT  BEDTIME 90 tablet 2  . famotidine (PEPCID) 20 MG tablet TAKE 1 TABLET BY MOUTH TWO  TIMES DAILY 180 tablet 2  . furosemide (LASIX) 20 MG tablet TAKE 1 TABLET BY MOUTH  DAILY AS NEEDED FOR EDEMA 90 tablet 1  . gabapentin (NEURONTIN) 300 MG capsule TAKE 2 CAPSULES BY MOUTH  EVERY MORNING, 1 CAPSULE  EVERY AFTERNOON AND 2  CAPSULES AT BEDTIME. 450 capsule 1  .  hydroxychloroquine (PLAQUENIL) 200 MG tablet TAKE 1 TABLET BY MOUTH TWO  TIMES DAILY 180 tablet 0  . lovastatin (MEVACOR) 10 MG tablet TAKE 1 TABLET BY MOUTH  EVERY MONDAY, WEDNESDAY,  AND FRIDAY 40 tablet 3  . Polyethyl Glycol-Propyl Glycol (SYSTANE) 0.4-0.3 % GEL Place 1 drop into both eyes 2 (two) times daily.     . RESTASIS 0.05 % ophthalmic emulsion INSTILL ONE DROP IN BOTH  EYES TWO TIMES DAILY 180 each 0  . tiZANidine (ZANAFLEX) 4 MG tablet Take 1 tablet (4 mg total) by mouth every 8 (eight) hours as needed for muscle spasms.    . traMADol (ULTRAM) 50 MG tablet Take 1 tablet (50 mg total) by mouth 3 (three) times daily. 60 tablet 0  . hydrochlorothiazide (MICROZIDE)  12.5 MG capsule TAKE 1 CAPSULE BY MOUTH  DAILY 90 capsule 3  . metoprolol tartrate (LOPRESSOR) 25 MG tablet Take 1 tablet (25 mg total) by mouth 2 (two) times daily. 180 tablet 3  . predniSONE (DELTASONE) 10 MG tablet Take three tablets for 3 days followed by two tablets for 3 days followed by one tablet for 3 days 18 tablet 0   No facility-administered medications prior to visit.      Per HPI unless specifically indicated in ROS section below Review of Systems Objective:    BP (!) 151/76   Pulse 80   Wt 189 lb 4 oz (85.8 kg)   BMI 33.52 kg/m   Wt Readings from Last 3 Encounters:  08/25/18 189 lb 4 oz (85.8 kg)  07/16/18 194 lb 2 oz (88.1 kg)  05/27/18 191 lb (86.6 kg)     Physical exam: Gen: alert, NAD, not ill appearing Pulm: speaks in complete sentences without increased work of breathing Psych: normal mood, normal thought content      Lab Results  Component Value Date   CREATININE 1.36 (H) 08/07/2018   BUN 6 (L) 08/07/2018   NA 135 08/07/2018   K 3.6 08/07/2018   CL 102 08/07/2018   CO2 22 08/07/2018    Lab Results  Component Value Date   TSH 3.55 04/03/2018   T3TOTAL 141 01/30/2018    Assessment & Plan:   Problem List Items Addressed This Visit    Sjogren's syndrome ([place])    Followed by rheum.       Fibromyalgia    Sees rheum.  Discussed gabapentin dosing - ok to return to 600mg  TID dosing, but monitor for confusion/imbalance. Pt agrees.       Essential hypertension - Primary    BP elevated - will increase metoprolol to 50mg  bid. Continue other antihypertensive regimen.      Relevant Medications   metoprolol tartrate (LOPRESSOR) 50 MG tablet   CKD (chronic kidney disease) stage 3, GFR 30-59 ml/min (HCC)    Recent Cr bumped in ER, endorses good hydration status.           Meds ordered this encounter  Medications  . metoprolol tartrate (LOPRESSOR) 50 MG tablet    Sig: Take 1 tablet (50 mg total) by mouth 2 (two) times daily.    Dispense:  180  tablet    Refill:  1    Note new sig   No orders of the defined types were placed in this encounter.   I discussed the assessment and treatment plan with the patient. The patient was provided an opportunity to ask questions and all were answered. The patient agreed with the plan and demonstrated an understanding of  the instructions. The patient was advised to call back or seek an in-person evaluation if the symptoms worsen or if the condition fails to improve as anticipated.  Follow up plan: No follow-ups on file.  Ria Bush, MD

## 2018-08-25 NOTE — Assessment & Plan Note (Signed)
Recent Cr bumped in ER, endorses good hydration status.
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
## 2018-08-25 NOTE — Assessment & Plan Note (Signed)
Sees rheum.  Discussed gabapentin dosing - ok to return to 600mg  TID dosing, but monitor for confusion/imbalance. Pt agrees.

## 2018-09-03 ENCOUNTER — Telehealth: Payer: Self-pay | Admitting: Rheumatology

## 2018-09-03 NOTE — Telephone Encounter (Signed)
Patient advised if she prefers per Dr. Estanislado Pandy she can  have a trochanteric bursa injection then we can schedule her to be seen tomorrow or Monday. Patient declined to schedule an appointment. She states she has had the injection 4 times before and they did not help.

## 2018-09-03 NOTE — Telephone Encounter (Signed)
Patient called requesting prescription for pain medicine to be sent to [facility] at [place]. [address].  Patient states the Tramadol is not helping with her back and leg pain.

## 2018-09-03 NOTE — Telephone Encounter (Signed)
Patient states her left hip has been hurting for the last 3 days. Patient states she unable to put pressure on it. Patient states the pain increases when she walks. Patient states the pain is in her back then radiates to her buttocks and down into her thigh. Patient states she has tried Tramadol, Voltaren Gel ice and heat. Patient states she has not gotten any relief. Patient states the tramadol is not doing her any good and she needs to know what else she can do. Please advise.

## 2018-09-03 NOTE — Telephone Encounter (Signed)
Prefer to have a trochanteric bursa injection then we can schedule her to be seen tomorrow or Monday.

## 2018-09-07 ENCOUNTER — Encounter: Payer: Self-pay | Admitting: Rheumatology

## 2018-09-07 ENCOUNTER — Telehealth: Payer: Self-pay | Admitting: Family Medicine

## 2018-09-07 MED ORDER — TRAMADOL HCL 50 MG PO TABS: 50.0000 mg | ORAL_TABLET | Freq: Three times a day (TID) | ORAL | 0 refills | Status: DC

## 2018-09-07 NOTE — Telephone Encounter (Signed)
Would clarify with patient - does she want [place] to schedule mammogram? Doesn't need OV for this. As she's had mammogram before through them, should be able to call [phone] and schedule herself.

## 2018-09-07 NOTE — Telephone Encounter (Signed)
Last Visit:05/26/2018 Next Visit:10/27/2018 UDS:06/25/2018  Narc Agreement:06/25/2018   Okay to refill Tramadol?

## 2018-09-07 NOTE — Telephone Encounter (Signed)
Pt put in appt request through MyChart for mammogram. Please advise. See request below:   Appointment Request From: Alexandria Leach    With Provider: Owens Loffler, MD [facility] [place]    Preferred Date Range: From 09/03/2018 To 09/09/2018    Preferred Times: Any    Reason: To address the following health maintenance concerns.  Mammogram    Comments:  COULD I COME IN JUNE 24 at @10 :am

## 2018-09-08 ENCOUNTER — Telehealth: Payer: Self-pay | Admitting: *Deleted

## 2018-09-08 ENCOUNTER — Other Ambulatory Visit: Payer: Self-pay | Admitting: Family Medicine

## 2018-09-08 DIAGNOSIS — Z1231 Encounter for screening mammogram for malignant neoplasm of breast: Secondary | ICD-10-CM

## 2018-09-08 NOTE — Telephone Encounter (Signed)
Spoke with pt, received alert from [employer] regarding patient loosing 5 lbs overnight and needing to take fluid pill for swelling. Patient reports swelling in her feet and ankles that started Sunday. She denies SOB or orthopnea. She reports the swelling is some better but still has a small amount. Cautioned patient on fluid intake and sodium. She will call if the swelling does not improve or she develops SOB. Pt agreed with this plan.

## 2018-09-09 NOTE — Telephone Encounter (Signed)
Spoke with pt asking about mammogram.  Says she was trying to contact [place] and somehow the message came to [place].  Nevertheless, she has a mammogram scheduled in Aug 2020.

## 2018-10-05 ENCOUNTER — Telehealth: Payer: Self-pay

## 2018-10-05 NOTE — Telephone Encounter (Signed)
[facility] - Client TELEPHONE ADVICE RECORD [facility] Patient Name: Alexandria Leach Gender: Female DOB: 04-21-1950 Age: 68 Y 2 M 11 D Return Phone Number: [phone] (Primary), [phone] (Secondary) Address: City/State/Zip: [place] [address] Client [facility] Night - Client Client Site [place] - Night Contact Type Call Who Is Calling Patient / Member / Family / Caregiver Call Type Triage / Clinical Relationship To Patient Self Return Phone Number [phone] (Primary) Chief Complaint Flank Pain Reason for Call Symptomatic / Request for Health Information Initial Comment She is having trouble laying on her right side. Translation No Nurse Assessment Nurse: Verlin Fester, RN, [facility] Date/Time (Eastern Time): 10/05/2018 7:49:57 AM Confirm and document reason for call. If symptomatic, describe symptoms. ---Patient states she has nodules on her thyroid and when she lays on her right side it is bothering her Has the patient had close contact with a person known or suspected to have the novel coronavirus illness OR traveled / lives in area with major community spread (including international travel) in the last 14 days from the onset of symptoms? * If Asymptomatic, screen for exposure and travel within the last 14 days. ---No Does the patient have any new or worsening symptoms? ---Yes Will a triage be completed? ---Yes Related visit to physician within the last 2 weeks? ---No Does the PT have any chronic conditions? (i.e. diabetes, asthma, this includes High risk factors for pregnancy, etc.) ---Yes List chronic conditions. ---"heart, fibromyalgia, chronic kidney disease, Sjorens syndrome Is this a behavioral health or substance abuse call? ---No Guidelines Guideline Title Affirmed Question Affirmed Notes Nurse Date/Time Eilene Ghazi Time) Neck Pain or Stiffness Neck pain present > 2 weeks Verlin Fester, RNStanton Kidney  10/05/2018 7:51:30 AM Disp. Time Eilene Ghazi Time) Disposition Final User 10/05/2018 7:55:04 AM SEE PCP WITHIN 3 DAYS Yes Noe, RN, Nemiah Commander Disagree/Comply Comply PLEASE NOTE: All timestamps contained within this report are represented as [place] Standard Time. CONFIDENTIALTY NOTICE: This fax transmission is intended only for the addressee. It contains information that is legally privileged, confidential or otherwise protected from use or disclosure. If you are not the intended recipient, you are strictly prohibited from reviewing, disclosing, copying using or disseminating any of this information or taking any action in reliance on or regarding this information. If you have received this fax in error, please notify us immediately by telephone so that we can arrange for its return to [place]. Phone: [phone], Toll-Free: [phone], Fax: [phone] Page: 2 of 2 Call Id: [id number] [place] Understands Yes PreDisposition Call Doctor Care Advice Given Per Guideline SEE PCP WITHIN 3 DAYS: PAIN MEDICINES: * For pain relief, take acetaminophen, ibuprofen, or naproxen. * Use the lowest amount that makes your pain feel better. ACETAMINOPHEN (E.G., TYLENOL): * Acetaminophen is thought to be safer than ibuprofen or naproxen for people over 45 years old. Acetaminophen is in many OTC and prescription medicines. It might be in more than one medicine that you are taking. You need to be careful and not take an overdose. An acetaminophen overdose can hurt the liver. SLEEP: * Sleep on the back or side, not the abdomen. * Sleep with a neck collar. Use a foam neck collar (from a pharmacy) OR a small towel wrapped around the neck. (Reason: keep the head from moving too much during sleep.) ACTIVITY: After 48 hours of protecting the neck, begin gentle stretching exercises. AVOID: Avoid triggers that overstress the neck such as working with the neck turned or bent  backward, carrying heavy objects on the head, carrying  heavy objects with one arm (instead of both arms), standing on the head, contact sports or even friendly wrestling. STRETCHING EXERCISES: Improve the tone of the neck muscles with 2 or 3 minutes of gentle stretching exercises per day such as touching the chin to each shoulder, touching the ear to each shoulder, and moving the head forward and backward. Don't apply any resistance during these stretching exercises. CALL BACK IF: * You become worse. CARE ADVICE given per Neck Pain (Adult) guideline. Referrals REFERRED TO PCP OFFICE

## 2018-10-05 NOTE — Telephone Encounter (Signed)
Will see then. Due for thyroid US this month.

## 2018-10-05 NOTE — Telephone Encounter (Signed)
Per pts appt registry pt already has in office appt scheduled on 10/06/18 at 11:00 AM with Dr Danise Mina.

## 2018-10-06 ENCOUNTER — Ambulatory Visit (INDEPENDENT_AMBULATORY_CARE_PROVIDER_SITE_OTHER): Payer: Medicare Other | Admitting: Family Medicine

## 2018-10-06 ENCOUNTER — Encounter: Payer: Self-pay | Admitting: Family Medicine

## 2018-10-06 ENCOUNTER — Other Ambulatory Visit: Payer: Self-pay

## 2018-10-06 VITALS — BP 138/82 | HR 73 | Temp 98.8°F | Ht 63.0 in | Wt 190.4 lb

## 2018-10-06 DIAGNOSIS — M542 Cervicalgia: Secondary | ICD-10-CM | POA: Diagnosis not present

## 2018-10-06 DIAGNOSIS — R7989 Other specified abnormal findings of blood chemistry: Secondary | ICD-10-CM | POA: Diagnosis not present

## 2018-10-06 DIAGNOSIS — M3500 Sicca syndrome, unspecified: Secondary | ICD-10-CM

## 2018-10-06 DIAGNOSIS — E041 Nontoxic single thyroid nodule: Secondary | ICD-10-CM

## 2018-10-06 LAB — TSH: TSH: 1.91 u[IU]/mL (ref 0.35–4.50)

## 2018-10-06 LAB — T4, FREE: Free T4: 0.62 ng/dL (ref 0.60–1.60)

## 2018-10-06 LAB — CBC WITH DIFFERENTIAL/PLATELET
Basophils Absolute: 0 10*3/uL (ref 0.0–0.1)
Basophils Relative: 1.1 % (ref 0.0–3.0)
Eosinophils Absolute: 0.1 10*3/uL (ref 0.0–0.7)
Eosinophils Relative: 3.1 % (ref 0.0–5.0)
HCT: 38 % (ref 36.0–46.0)
Hemoglobin: 12.1 g/dL (ref 12.0–15.0)
Lymphocytes Relative: 32.9 % (ref 12.0–46.0)
Lymphs Abs: 1.3 10*3/uL (ref 0.7–4.0)
MCHC: 31.9 g/dL (ref 30.0–36.0)
MCV: 85.3 fl (ref 78.0–100.0)
Monocytes Absolute: 0.4 10*3/uL (ref 0.1–1.0)
Monocytes Relative: 10.5 % (ref 3.0–12.0)
Neutro Abs: 2 10*3/uL (ref 1.4–7.7)
Neutrophils Relative %: 52.4 % (ref 43.0–77.0)
Platelets: 210 10*3/uL (ref 150.0–400.0)
RBC: 4.46 Mil/uL (ref 3.87–5.11)
RDW: 14.4 % (ref 11.5–15.5)
WBC: 3.8 10*3/uL — ABNORMAL LOW (ref 4.0–10.5)

## 2018-10-06 NOTE — Progress Notes (Signed)
This visit was conducted in person.  BP 138/82 (BP Location: Left Arm, Patient Position: Sitting, Cuff Size: Normal)   Pulse 73   Temp 98.8 F (37.1 C) (Temporal)   Ht 5\' 3"  (1.6 m)   Wt 190 lb 6.4 oz (86.4 kg)   SpO2 98%   BMI 33.73 kg/m    CC: neck pain Subjective:    Patient ID: Alexandria Leach, female    DOB: [birth year], 68 y.o.   MRN: [id number]  HPI: Alexandria Leach is a 68 y.o. female presenting on 10/06/2018 for Neck Pain (Pt states that the right side of her neck has been hurting for about 2 months, hard to lay on right side. Right sided neck/throat swelling down into clavicle. )   2 month h/o R neck pain progressively worsening - more painful when laying on right side. Noticing increasing swelling from R jaw into clavicle. Notes some dysphagia to thicker foods like bread. She does notice dryer mouth than normal.   Known h/o sjogren syndrome - on plaquenil followed by rheum.   No recent fevers/chills, ST. No posterior neck pain  Denies inciting trauma/injury.  No new pillows or mattress.  No heat or cold intolerance. No unexpected weight changes. No diarrhea/constipation.   Thyroid US - rec annual [place] for 5 yrs (09/2017) IMPRESSION: Left inferior thyroid nodule (labeled 2) meets criteria for surveillance, as designated by the newly established ACR TI-RADS criteria. Surveillance ultrasound study recommended to be performed annually up to 5 years Recommendations follow those established by the new ACR TI-RADS criteria (J Am Coll Radiol 2017;14:587-595) Electronically Signed By: Corrie Mckusick D.O. On: 09/25/2017 15:45     Relevant past medical, surgical, family and social history reviewed and updated as indicated. Interim medical history since our last visit reviewed. Allergies and medications reviewed and updated. Outpatient Medications Prior to Visit  Medication Sig Dispense Refill  . ALPRAZolam (XANAX) 0.5 MG tablet Take 1 tablet (0.5 mg total) by mouth 2 (two)  times daily. 180 tablet 2  . amitriptyline (ELAVIL) 25 MG tablet Take 1 tablet (25 mg total) by mouth at bedtime. 90 tablet 2  . antiseptic oral rinse (BIOTENE) LIQD 15 mLs by Mouth Rinse route at bedtime.     Marland Kitchen aspirin 325 MG EC tablet Take 325 mg by mouth daily.     . cholecalciferol (VITAMIN D) 1000 UNITS tablet Take 1,000 Units by mouth daily.     Marland Kitchen Co-Enzyme Q-10 100 MG CAPS Take 1 capsule (100 mg total) by mouth daily.  0  . diclofenac sodium (VOLTAREN) 1 % GEL Apply 1 application topically 2 (two) times daily as needed (pain).    Marland Kitchen escitalopram (LEXAPRO) 20 MG tablet Take 1 tablet (20 mg total) by mouth 2 (two) times daily. 180 tablet 2  . ezetimibe (ZETIA) 10 MG tablet TAKE 1 TABLET BY MOUTH AT  BEDTIME 90 tablet 2  . famotidine (PEPCID) 20 MG tablet TAKE 1 TABLET BY MOUTH TWO  TIMES DAILY 180 tablet 2  . furosemide (LASIX) 20 MG tablet TAKE 1 TABLET BY MOUTH  DAILY AS NEEDED FOR EDEMA 90 tablet 1  . gabapentin (NEURONTIN) 300 MG capsule TAKE 2 CAPSULES BY MOUTH  EVERY MORNING, 1 CAPSULE  EVERY AFTERNOON AND 2  CAPSULES AT BEDTIME. 450 capsule 1  . hydroxychloroquine (PLAQUENIL) 200 MG tablet TAKE 1 TABLET BY MOUTH TWO  TIMES DAILY 180 tablet 0  . lovastatin (MEVACOR) 10 MG tablet TAKE 1 TABLET BY MOUTH  EVERY MONDAY,  WEDNESDAY,  AND FRIDAY 40 tablet 3  . metoprolol tartrate (LOPRESSOR) 50 MG tablet Take 1 tablet (50 mg total) by mouth 2 (two) times daily. 180 tablet 1  . Polyethyl Glycol-Propyl Glycol (SYSTANE) 0.4-0.3 % GEL Place 1 drop into both eyes 2 (two) times daily.     . RESTASIS 0.05 % ophthalmic emulsion INSTILL ONE DROP IN BOTH  EYES TWO TIMES DAILY 180 each 0  . traMADol (ULTRAM) 50 MG tablet Take 1 tablet (50 mg total) by mouth 3 (three) times daily. 60 tablet 0  . diltiazem (CARDIZEM CD) 240 MG 24 hr capsule Take 1 capsule (240 mg total) by mouth daily. 90 capsule 3  . tiZANidine (ZANAFLEX) 4 MG tablet Take 1 tablet (4 mg total) by mouth every 8 (eight) hours as needed for  muscle spasms.     No facility-administered medications prior to visit.      Per HPI unless specifically indicated in ROS section below Review of Systems Objective:    BP 138/82 (BP Location: Left Arm, Patient Position: Sitting, Cuff Size: Normal)   Pulse 73   Temp 98.8 F (37.1 C) (Temporal)   Ht 5\' 3"  (1.6 m)   Wt 190 lb 6.4 oz (86.4 kg)   SpO2 98%   BMI 33.73 kg/m   Wt Readings from Last 3 Encounters:  10/06/18 190 lb 6.4 oz (86.4 kg)  08/25/18 189 lb 4 oz (85.8 kg)  07/16/18 194 lb 2 oz (88.1 kg)    Physical Exam Vitals signs and nursing note reviewed.  Constitutional:      General: She is not in acute distress.    Appearance: Normal appearance. She is not ill-appearing.  HENT:     Head: Normocephalic and atraumatic.     Nose: Nose normal.     Mouth/Throat:     Mouth: Mucous membranes are moist.     Pharynx: Oropharynx is clear. No oropharyngeal exudate or posterior oropharyngeal erythema.     Comments: No abnormality of stensen's duct, no pain or mass at teeth or at floor of mouth.  Eyes:     Extraocular Movements: Extraocular movements intact.     Conjunctiva/sclera: Conjunctivae normal.     Pupils: Pupils are equal, round, and reactive to light.  Neck:     Musculoskeletal: Normal range of motion and neck supple. Muscular tenderness present. No neck rigidity.     Thyroid: Thyroid mass (known L thyroid nodule) present. No thyromegaly or thyroid tenderness.      Comments: FROM at cervical neck without midline tenderness. Tender to palpation of neck at R submandibular region without appreciable mass, lymphadenopathy. No pain along jaw, no pain or swelling at parotid.  Lymphadenopathy:     Cervical: No cervical adenopathy.  Neurological:     Mental Status: She is alert.       Lab Results  Component Value Date   TSH 3.55 04/03/2018   T3TOTAL 141 01/30/2018   Assessment & Plan:   Problem List Items Addressed This Visit    Thyroid nodule    Due for updated  thyroid ultrasound - ordered      Relevant Orders   TSH   US THYROID   Sjogren's syndrome (HCC)   Neck pain on right side - Primary    Tender along R submandibular gland - anticipate sialadenitis in h/o sjogren's disease, no evidence of stone. Reassurance provided. rec tylenol and warm compress for discomfort along with massage. If ongoing, rec f/u with rheum. Pt agrees with plan.  Relevant Orders   CBC with Differential/Platelet   Abnormal serum thyroid stimulating hormone (TSH) level    Update TFTs.       Relevant Orders   TSH   T4, free       No orders of the defined types were placed in this encounter.  Orders Placed This Encounter  Procedures  . US THYROID    Standing Status:   Future    Standing Expiration Date:   12/07/2019    Order Specific Question:   Reason for Exam (SYMPTOM  OR DIAGNOSIS REQUIRED)    Answer:   f/u L thyroid nodule    Order Specific Question:   Preferred imaging location?    Answer:   [facility]  . TSH  . CBC with Differential/Platelet  . T4, free    Patient Instructions  I think you have inflammation of the salivary gland under R cheek.  Neck is looking ok as is thyroid. We will check thyroid ultrasound to follow left thyroid nodule.  If ongoing pain, call for recheck by rheumatology.    Follow up plan: Return if symptoms worsen or fail to improve.  Ria Bush, MD

## 2018-10-06 NOTE — Assessment & Plan Note (Addendum)
Due for updated thyroid ultrasound - ordered

## 2018-10-06 NOTE — Assessment & Plan Note (Addendum)
Tender along R submandibular gland - anticipate sialadenitis in h/o sjogren's disease, no evidence of stone. Reassurance provided. rec tylenol and warm compress for discomfort along with massage. If ongoing, rec f/u with rheum. Pt agrees with plan.

## 2018-10-06 NOTE — Patient Instructions (Addendum)
I think you have inflammation of the salivary gland under R cheek.  Neck is looking ok as is thyroid. We will check thyroid ultrasound to follow left thyroid nodule.  If ongoing pain, call for recheck by rheumatology.

## 2018-10-06 NOTE — Assessment & Plan Note (Signed)
Update TFTs ° °

## 2018-10-12 ENCOUNTER — Other Ambulatory Visit: Payer: Self-pay | Admitting: Rheumatology

## 2018-10-12 DIAGNOSIS — M3501 Sicca syndrome with keratoconjunctivitis: Secondary | ICD-10-CM

## 2018-10-12 NOTE — Telephone Encounter (Signed)
Last Visit:05/26/2018 Next Visit:10/27/2018 Labs: 07/16/18 Monocytes relative 13.2 Creat. 1.29 GFR 50.3 PLQ Eye Exam: 08/03/2018 WNL   Okay to refill per Dr. Estanislado Pandy

## 2018-10-13 NOTE — Progress Notes (Signed)
Office Visit Note  Patient: Alexandria Leach             Date of Birth: Sep 01, 1950           MRN: [id number]             PCP: Ria Bush, MD Referring: Ria Bush, MD Visit Date: 10/27/2018 Occupation: @GUAROCC @  Subjective:  Trochanteric bursitis bilaterally   History of Present Illness: Alexandria Leach is a 68 y.o. female with history of Sjogren's and fibromyalgia. She takes plaquenil 200 mg 1 tablet by mouth twice daily.  She discontinued taking pilocarpine due to experiencing excessive sweating.  She continues to have chronic sicca symptoms.  She reports that she has been having more frequent fibromyalgia flares.  She has generalized muscle aches and muscle tenderness.  She is also having trochanter bursitis bilaterally.  She has not been performing any stretching exercises.  She has been applying Voltaren gel topically as needed.  She denies any other joint pain or joint swelling at this time.    Activities of Daily Living:  Patient reports morning stiffness for 30-45  minutes.   Patient Reports nocturnal pain.  Difficulty dressing/grooming: Denies Difficulty climbing stairs: Reports Difficulty getting out of chair: Reports Difficulty using hands for taps, buttons, cutlery, and/or writing: Denies  Review of Systems  Constitutional: Positive for fatigue.  HENT: Positive for mouth dryness. Negative for mouth sores and nose dryness.   Eyes: Positive for dryness. Negative for pain and visual disturbance.  Respiratory: Negative for cough, hemoptysis, shortness of breath and difficulty breathing.   Cardiovascular: Negative for chest pain, palpitations, hypertension and swelling in legs/feet.  Gastrointestinal: Negative for blood in stool, constipation and diarrhea.  Endocrine: Negative for increased urination.  Genitourinary: Negative for painful urination.  Musculoskeletal: Positive for arthralgias, joint pain and morning stiffness. Negative for joint  swelling, myalgias, muscle weakness, muscle tenderness and myalgias.  Skin: Negative for color change, pallor, rash, hair loss, nodules/bumps, skin tightness, ulcers and sensitivity to sunlight.  Allergic/Immunologic: Negative for susceptible to infections.  Neurological: Negative for dizziness, numbness, headaches and weakness.  Hematological: Negative for swollen glands.  Psychiatric/Behavioral: Negative for depressed mood and sleep disturbance. The patient is not nervous/anxious.     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   TIA (transient ischemic attack) 10/12/2015   Mild mitral regurgitation 10/12/2015   Osteopenia 09/18/2015   Right arm pain 08/29/2015   History of fall    Trimalleolar fracture of left ankle 02/23/2015   Right sided sciatica 02/21/2015   Imbalance 01/12/2015   Transaminitis 12/24/2014   DDD (degenerative disc disease), cervical    Health maintenance examination 10/18/2014   Advanced care planning/counseling discussion 10/18/2014   Medicare annual wellness visit, initial 10/18/2014   Refusal of blood transfusions as patient is Jehovah's Witness    Sjogren's syndrome ([place])    CKD (chronic kidney disease) stage 3, GFR 30-59 ml/min (HCC)    Glaucoma  Fibromyalgia    Osteoarthritis    History of pulmonary embolism    Lung nodule 19/41/7408   Diastolic CHF ([place])  14/48/1856   Aortic stenosis 04/22/2011   Palpitations 08/10/2010   Dyspnea 08/10/2010   HOT FLASHES 06/28/2008   Trochanteric bursitis of right hip 06/28/2008   ARTHRALGIA 07/01/2007   BACK PAIN, LEFT 08/25/2006   GAD (generalized anxiety disorder) 03/28/2006   History of mitral valve repair 03/28/2006   TAH/BSO, HX OF 03/28/2006   HYPERCHOLESTEROLEMIA 12/31/2005   MDD (major depressive disorder), recurrent episode ([place]) 12/31/2005   RHEUMATIC HEART DISEASE 12/31/2005   Essential hypertension 12/31/2005   Chronic insomnia 12/31/2005    Past Medical History:  Diagnosis Date   Ankle fracture, left 02/19/2015   from a fall   Anxiety    Cataract 2014   corrected with surgery   CHF (congestive heart failure) (HCC)    CKD (chronic kidney disease) stage 3, GFR 30-59 ml/min (HCC)    saw nephrologist Dr Harden Mo   DDD (degenerative disc disease), cervical    Depression    Fibromyalgia    Glaucoma    s/p surgery, sees ophtho Q6 mo   History of DVT (deep vein thrombosis) several times latest 2012   receives coumadin while hospitalized   History of kidney stones 2010   History of pulmonary embolism 2001, 2006   completed coumadin courses   History of rheumatic fever x3   HLD (hyperlipidemia)    HTN (hypertension)    Insomnia    Lung nodule 09/22/2012   RLL - 21mm, stable since 2014. Thought benign.    Osteoarthritis    shoulders and knees, not RA per Dr Estanislado Pandy, positive ANA, positive Ro   Osteopenia 09/18/2015   DEXA T -1.1 hip, -0.2 spine 08/2015    Personal history of urinary calculi latest 2014   Pneumonia 12/02/2011   PONV (postoperative nausea and vomiting)    Refusal of blood transfusions as patient is Jehovah's Witness    Rheumatic heart disease 1980   s/p mitral valve repair 1980   Sjogren's syndrome ([place])     Family History  Problem Relation Age of Onset   Lupus Sister        and niece   Cancer Mother        lung  (nonsmoker)   CAD Mother        MI in her 71s   ALS Mother    Kidney disease Father    Alcohol abuse Father    Diabetes Father    Cancer Brother        bone   Diabetes Sister    Stroke Sister    Cancer Maternal Uncle        bone   Depression Sister    Kidney failure Other        on HD   Diabetes Brother    Heart attack Brother    Stroke Maternal Grandmother    Past Surgical History:  Procedure Laterality Date   BREAST BIOPSY Right 2006   benign   CHOLECYSTECTOMY  11/27/2011   Procedure: LAPAROSCOPIC CHOLECYSTECTOMY WITH INTRAOPERATIVE CHOLANGIOGRAM;  Surgeon: Adin Hector, MD;  Location: [place];  Service: General;  Laterality: N/A;  laparoscopic cholecystectomy with choleangiogram umbilical hernia repair   COLONOSCOPY  07/2014   WNL Amedeo Plenty)   dexa  08/2012   normal per patient - no records available   EYE SURGERY Bilateral 2014   cataract removal   [place]  open heart   ORIF ANKLE FRACTURE Left 02/26/2015   Procedure: OPEN REDUCTION INTERNAL FIXATION (ORIF) LEFT TRIMALLEOLAR ANKLE FRACTURE;  Surgeon: [name], MD;  Location: [place];  Service: Orthopedics;  Laterality: Left;   TUBAL LIGATION  4132   UMBILICAL HERNIA REPAIR  11/27/2011   Procedure: HERNIA REPAIR UMBILICAL ADULT;  Surgeon: Adin Hector, MD;  Location: [place];  Service: General;  Laterality: N/A;   [place]   for fibroids -- partial, ovaries remain   Social History   Social History Narrative   Lives with son, 1 dog   Occupation: unemployed, on disability for fibromyalgia since 2008.   Edu: HS   Religion: Jehova's witness   Activity: volunteers at senior center   Diet: some water, fruits/vegetables daily   No caffeine use   Immunization History  Administered Date(s) Administered   Pneumococcal Conjugate-13 12/15/2017   Pneumococcal Polysaccharide-23 09/15/2012     Objective: Vital Signs: BP (!) 148/74 (BP Location: Left Arm,  Patient Position: Sitting, Cuff Size: Large)    Pulse 71    Resp 14    Ht 5\' 1"  (1.549 m)    Wt 194 lb 6.4 oz (88.2 kg)    BMI 36.73 kg/m    Physical Exam Vitals signs and nursing note reviewed.  Constitutional:      Appearance: She is well-developed.  HENT:     Head: Normocephalic and atraumatic.  Eyes:     Conjunctiva/sclera: Conjunctivae normal.  Neck:     Musculoskeletal: Normal range of motion.  Cardiovascular:     Rate and Rhythm: Normal rate and regular rhythm.     Heart sounds: Normal heart sounds.  Pulmonary:     Effort: Pulmonary effort is normal.     Breath sounds: Normal breath sounds.  Abdominal:     General: Bowel sounds are normal.     Palpations: Abdomen is soft.  Lymphadenopathy:     Cervical: No cervical adenopathy.  Skin:    General: Skin is warm and dry.     Capillary Refill: Capillary refill takes less than 2 seconds.  Neurological:     Mental Status: She is alert and oriented to person, place, and time.  Psychiatric:        Behavior: Behavior normal.      Musculoskeletal Exam: C-spine, thoracic spine, and lumbar spine good ROM.  No midline spinal tenderness.  No SI joint tenderness.  Shoulder joints, elbow joints, wrist joints, MCPs, PIPs, and DIPs good ROM with no synovitis.  Complete fist formation bilaterally.  Hip joints, knee joints, ankle joints, MTPs, PIPs, and DIPs good ROM with no synovitis.  No warmth or effusion of knee joints.  No tenderness or swelling of ankle joints.  Tenderness over trochanteric bursa bilaterally.   CDAI Exam: CDAI Score: -- Patient Global: --; Provider Global: -- Swollen: --; Tender: -- Joint Exam   No joint exam has been documented for this visit   There is currently no information documented on the homunculus. Go to the Rheumatology activity and complete the homunculus joint exam.  Investigation: No additional findings.  Imaging: Mm 3d Screen Breast Bilateral  Result Date: 10/23/2018 CLINICAL DATA:   Screening. EXAM: DIGITAL SCREENING BILATERAL MAMMOGRAM WITH TOMO AND CAD COMPARISON:  Previous exam(s). ACR Breast Density Category b: There are scattered areas of fibroglandular density. FINDINGS: There are no findings suspicious for malignancy. Images were processed with CAD. IMPRESSION: No mammographic evidence of malignancy. A result letter of this screening mammogram  will be mailed directly to the patient. RECOMMENDATION: Screening mammogram in one year. (Code:SM-B-01Y) BI-RADS CATEGORY  1: Negative. Electronically Signed   By: Everlean Alstrom M.D.   On: 10/23/2018 13:50   US Thyroid  Result Date: 10/16/2018 CLINICAL DATA:  68 year old female with thyroid nodules EXAM: THYROID ULTRASOUND TECHNIQUE: Ultrasound examination of the thyroid gland and adjacent soft tissues was performed. COMPARISON:  September 25, 2017 FINDINGS: Parenchymal Echotexture: Moderately heterogenous Isthmus: 0.2 cm Right lobe: 4.3 cm x 0.8 cm x 1.2 cm Left lobe: 4.2 cm x 1.1 cm x 1.5 cm _________________________________________________________ Estimated total number of nodules >/= 1 cm: 2 Number of spongiform nodules >/=  2 cm not described below (TR1): 0 Number of mixed cystic and solid nodules >/= 1.5 cm not described below (TR2): 0 _________________________________________________________ Left mid thyroid nodule labeled 1 measures slightly smaller than comparison, 2.0 cm. This does not meet criteria for surveillance or biopsy The isthmic nodule towards the left measures slightly smaller than the comparison, 1.6 cm, and remains TR 3. No adenopathy IMPRESSION: Left isthmic thyroid nodule (labeled 2) meets criteria for surveillance, as designated by the newly established ACR TI-RADS criteria. Surveillance ultrasound study recommended to be performed annually up to 5 years. Recommendations follow those established by the new ACR TI-RADS criteria (J Am Coll Radiol 1287;86:767-209). Electronically Signed   By: Corrie Mckusick D.O.   On:  10/16/2018 15:05    Recent Labs: Lab Results  Component Value Date   WBC 3.8 (L) 10/06/2018   HGB 12.1 10/06/2018   PLT 210.0 10/06/2018   NA 135 08/07/2018   K 3.6 08/07/2018   CL 102 08/07/2018   CO2 22 08/07/2018   GLUCOSE 89 08/07/2018   BUN 6 (L) 08/07/2018   CREATININE 1.36 (H) 08/07/2018   BILITOT 0.5 07/16/2018   ALKPHOS 71 07/16/2018   AST 27 07/16/2018   ALT 17 07/16/2018   PROT 7.6 07/16/2018   ALBUMIN 4.1 07/16/2018   CALCIUM 9.5 08/07/2018   GFRAA 47 (L) 08/07/2018    Speciality Comments: PLQ Eye Exam: 08/03/2018 WNL @ [facility] Follow up in 1 year  Procedures:  No procedures performed Allergies: Cymbalta [duloxetine hcl], Statins, and Sulfa antibiotics   Assessment / Plan:     Visit Diagnoses: Sjogren's syndrome with keratoconjunctivitis sicca ([place]) - +ANA, +Ro: She continues to have chronic sicca symptoms.  She takes Plaquenil 200 mg 1 tablet by mouth twice daily.  She discontinued pilocarpine due to not being able to tolerate the excessive sweating.  She uses Biotene mouth rinse and Restasis eyedrops twice daily for symptomatic relief.  She will continue taking Plaquenil as prescribed.  We will continue to monitor lab work every 5 months.  She was advised to notify us if she develops any new or worsening symptoms.  She will follow-up in the office in 6 months.  High risk medication use - Plaquenil 200 mg by mouth BID.  CBC was drawn on 10/06/2018.  White blood cell count was low at 3.8.  BMP was drawn on 08/07/2018 and creatinine was 1.36 with a GFR of 47.  She will be due to update lab work in October and every 5 months to monitor for drug toxicity.  Future orders for CBC, CMP, and UA were placed today.  Fibromyalgia - She has been having more frequent fibromyalgia flares.  She has generalized muscle aches and muscle tenderness.  She has been experiencing worsening fatigue recently.  She experiences nocturnal pain due to trochanteric bursitis  bilaterally.  She  has been applying Voltaren gel topically as needed for pain relief.  She declined cortisone injections today.  She declined physical therapy.  She was given a handout of exercises to perform at home.  She was encouraged to stay active and exercise on a regular basis.  She takes tramadol very sparingly for pain relief.  She does not need a refill of tramadol at this time.  Other insomnia -She has interrupted sleep at night due to nocturnal pain.  She has been experiencing trochanter bursitis bilaterally.  Good sleep hygiene was discussed.  Other chronic pain - She takes tramadol very sparingly for pain relief.  Other fatigue - She has chronic fatigue related to fibromyalgia.  She was encouraged to stay active and exercise on a regular basis.  DDD (degenerative disc disease), cervical - She has good range of motion with no discomfort.  No symptoms of radiculopathy.  Osteopenia of multiple sites -DEXA 09/14/15: The BMD measured at Femur Neck Left is 0.891 g/cm2 with a T-score of -1.1.  Ordered by PCP.   Other medical conditions are listed as follows:   History of pulmonary embolism   History of hypertension   CKD (chronic kidney disease) stage 3, GFR 30-59 ml/min (HCC)  History of anxiety   History of hypercholesterolemia   Orders: Orders Placed This Encounter  Procedures   CBC with Differential/Platelet   COMPLETE METABOLIC PANEL WITH GFR   Urinalysis, Routine w reflex microscopic   No orders of the defined types were placed in this encounter.     Follow-Up Instructions: Return in about 6 months (around 04/29/2019) for Sjogren's syndrome, Fibromyalgia.   Ofilia Neas, PA-C  Note - This record has been created using Dragon software.  Chart creation errors have been sought, but may not always  have been located. Such creation errors do not reflect on  the standard of medical care.

## 2018-10-16 ENCOUNTER — Ambulatory Visit
Admission: RE | Admit: 2018-10-16 | Discharge: 2018-10-16 | Disposition: A | Discharge status: Home / Self Care | Payer: Medicare Other | Source: Ambulatory Visit | Attending: Family Medicine | Admitting: Family Medicine

## 2018-10-16 DIAGNOSIS — E041 Nontoxic single thyroid nodule: Secondary | ICD-10-CM | POA: Diagnosis not present

## 2018-10-21 ENCOUNTER — Encounter: Payer: Self-pay | Admitting: *Deleted

## 2018-10-22 ENCOUNTER — Telehealth: Payer: Self-pay | Admitting: Rheumatology

## 2018-10-22 NOTE — Telephone Encounter (Signed)
Patient advised she may update her labs at her appointment on 10/27/18.

## 2018-10-22 NOTE — Telephone Encounter (Signed)
Patient calling to see if she is due for labs before her upcoming appt on Tuesday 10/27/2018. Patient will be out for another appt tomorrow am. She would like to come for labs then, if needed. Please call to advise.

## 2018-10-23 ENCOUNTER — Other Ambulatory Visit: Payer: Self-pay

## 2018-10-23 ENCOUNTER — Ambulatory Visit
Admission: RE | Admit: 2018-10-23 | Discharge: 2018-10-23 | Disposition: A | Discharge status: Home / Self Care | Payer: Medicare Other | Source: Ambulatory Visit | Attending: Family Medicine | Admitting: Family Medicine

## 2018-10-23 DIAGNOSIS — Z1231 Encounter for screening mammogram for malignant neoplasm of breast: Secondary | ICD-10-CM

## 2018-10-23 LAB — HM MAMMOGRAPHY

## 2018-10-23 NOTE — Progress Notes (Signed)
Virtual Visit via Video Note changed to phone visit at pt request   This visit type was conducted due to national recommendations for restrictions regarding the COVID-19 Pandemic (e.g. social distancing) in an effort to limit this patient's exposure and mitigate transmission in our community.  Due to her co-morbid illnesses, this patient is at least at moderate risk for complications without adequate follow up.  This format is felt to be most appropriate for this patient at this time.  All issues noted in this document were discussed and addressed.  A limited physical exam was performed with this format.  Please refer to the patient's chart for her consent to telehealth for [facility].   Date:  10/28/2018   ID:  Alexandria Leach, DOB Dec 10, 1950, MRN [id number]  Patient Location:Home Provider Location: Home  PCP:  Ria Bush, MD  Cardiologist:  Dr Stanford Breed  Evaluation Performed:  Follow-Up Visit  Chief Complaint:  FU MV repair  History of Present Illness:    FU mitral valve repair secondary to rheumatic heart disease. Patient is status post mitral valve repair in 1980. Operative report not available. Nuclear study 11/14 showed EF 66 and normal perfusion. Holter 11/14 showed sinus with pacs, pvcs and brief PAT. Last echocardiogram March 2019 showed normal LV function, moderate diastolic dysfunction, mild aortic stenosis with mean gradient 12 mmHg, mild aortic insufficiency, moderate mitral stenosis with mean gradient 10 mmHg, mild mitral regurgitation and moderate left atrial enlargement.  Carotid Dopplers October 2019 showed no significant obstruction.  Since last seen,she has dyspnea with more vigorous activities but not routine activities.  No orthopnea, PND, chest pain or syncope.  She takes Lasix as needed for mild lower extremity edema.  The patient does not have symptoms concerning for COVID-19 infection (fever, chills, cough, or new shortness of breath).    Past  Medical History:  Diagnosis Date  . Ankle fracture, left 02/19/2015   from a fall  . Anxiety   . Cataract 2014   corrected with surgery  . CHF (congestive heart failure) ([place])   . CKD (chronic kidney disease) stage 3, GFR 30-59 ml/min [facility])    saw nephrologist Dr Harden Mo  . DDD (degenerative disc disease), cervical   . Depression   . Fibromyalgia   . Glaucoma    s/p surgery, sees ophtho Q6 mo  . 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 History of kidney stones 2010  . History of pulmonary embolism 2001, 2006   completed coumadin courses  . History of rheumatic fever x3  . HLD (hyperlipidemia)   . HTN (hypertension)   . Insomnia   . Lung nodule 09/22/2012   RLL - 13mm, stable since 2014. Thought benign.   . Osteoarthritis    shoulders and knees, not RA per Dr Estanislado Pandy, positive ANA, positive Ro  . Osteopenia 09/18/2015   DEXA T -1.1 hip, -0.2 spine 08/2015   . Personal history of urinary calculi latest 2014  . Pneumonia 12/02/2011  . PONV (postoperative nausea and vomiting)   . Refusal of blood transfusions as patient is Jehovah's Witness   . Rheumatic heart disease 1980   s/p mitral valve repair 1980  . Sjogren's syndrome [facility])    Past Surgical History:  Procedure Laterality Date  . BREAST BIOPSY Right 2006   benign  . CHOLECYSTECTOMY  11/27/2011   Procedure: LAPAROSCOPIC CHOLECYSTECTOMY WITH INTRAOPERATIVE CHOLANGIOGRAM;  Surgeon: Adin Hector, MD;  Location: [facility]  OR;  Service: General;  Laterality: N/A;  laparoscopic cholecystectomy with choleangiogram umbilical hernia repair  . COLONOSCOPY  07/2014   WNL Amedeo Plenty)  . dexa  08/2012   normal per patient - no records available  . EYE SURGERY Bilateral 2014   cataract removal  . MITRAL VALVE REPAIR  1980   open heart  . ORIF ANKLE FRACTURE Left 02/26/2015   Procedure: OPEN REDUCTION INTERNAL FIXATION (ORIF) LEFT TRIMALLEOLAR ANKLE FRACTURE;  Surgeon: [name], MD;  Location: [place];  Service: Orthopedics;  Laterality: Left;  . TUBAL LIGATION  1980  . UMBILICAL HERNIA REPAIR  11/27/2011   Procedure: HERNIA REPAIR UMBILICAL ADULT;  Surgeon: Adin Hector, MD;  Location: [place];  Service: General;  Laterality: N/A;  . [place]   for fibroids -- partial, ovaries remain     Current Meds  Medication Sig  . ALPRAZolam (XANAX) 0.5 MG tablet Take 1 tablet (0.5 mg total) by mouth 2 (two) times daily.  Marland Kitchen amitriptyline (ELAVIL) 25 MG tablet Take 1 tablet (25 mg total) by mouth at bedtime.  Marland Kitchen antiseptic oral rinse (BIOTENE) LIQD 15 mLs by Mouth Rinse route at bedtime.   Marland Kitchen aspirin 325 MG EC tablet Take 325 mg by mouth daily.   . cholecalciferol (VITAMIN D) 1000 UNITS tablet Take 1,000 Units by mouth daily.   Marland Kitchen Co-Enzyme Q-10 100 MG CAPS Take 1 capsule (100 mg total) by mouth daily.  . diclofenac sodium (VOLTAREN) 1 % GEL Apply 1 application topically 2 (two) times daily as needed (pain).  Marland Kitchen diltiazem (CARDIZEM CD) 240 MG 24 hr capsule daily.  Marland Kitchen escitalopram (LEXAPRO) 20 MG tablet Take 1 tablet (20 mg total) by mouth 2 (two) times daily.  Marland Kitchen ezetimibe (ZETIA) 10 MG tablet TAKE 1 TABLET BY MOUTH AT  BEDTIME  . famotidine (PEPCID) 20 MG tablet TAKE 1 TABLET BY MOUTH TWO  TIMES DAILY  . furosemide (LASIX) 20 MG tablet TAKE 1 TABLET BY MOUTH  DAILY AS NEEDED FOR EDEMA  . gabapentin (NEURONTIN) 300 MG capsule TAKE 2 CAPSULES BY MOUTH  EVERY MORNING, 1 CAPSULE  EVERY AFTERNOON AND 2  CAPSULES AT BEDTIME.  . hydroxychloroquine (PLAQUENIL) 200 MG tablet TAKE 1 TABLET BY MOUTH  TWICE DAILY  . lovastatin (MEVACOR) 10 MG tablet TAKE 1 TABLET BY MOUTH  EVERY MONDAY, WEDNESDAY,  AND FRIDAY  . metoprolol tartrate (LOPRESSOR) 50 MG tablet Take 1 tablet (50 mg total) by mouth 2 (two) times daily.  Vladimir Faster Glycol-Propyl Glycol (SYSTANE) 0.4-0.3 % GEL Place 1 drop into both eyes 2 (two) times daily.   . RESTASIS 0.05 % ophthalmic emulsion INSTILL ONE  DROP IN BOTH  EYES TWO TIMES DAILY  . traMADol (ULTRAM) 50 MG tablet Take 1 tablet (50 mg total) by mouth 3 (three) times daily.     Allergies:   Cymbalta [duloxetine hcl], Statins, and Sulfa antibiotics   Social History   Tobacco Use  . Smoking status: Never Smoker  . Smokeless tobacco: Never Used  Substance Use Topics  . Alcohol use: No    Alcohol/week: 0.0 standard drinks  . Drug use: No     Family Hx: The patient's family history includes ALS in her mother; Alcohol abuse in her father; CAD in her mother; Cancer in her brother, maternal uncle, and mother; Depression in her sister; Diabetes in her brother, father, and sister; Heart attack in her brother; Kidney disease in her father; Kidney failure in an other family  member; Lupus in her sister; Stroke in her maternal grandmother and sister.  ROS:   Please see the history of present illness.    Fatigue but No Fever, chills  or productive cough All other systems reviewed and are negative.   Recent Labs: 11/02/2017: B Natriuretic Peptide 166.0 07/16/2018: ALT 17 08/07/2018: BUN 6; Creatinine, Ser 1.36; Potassium 3.6; Sodium 135 10/06/2018: Hemoglobin 12.1; Platelets 210.0; TSH 1.91   Recent Lipid Panel Lab Results  Component Value Date/Time   CHOL 239 (H) 12/11/2017 10:46 AM   TRIG 93.0 12/11/2017 10:46 AM   HDL 62.20 12/11/2017 10:46 AM   CHOLHDL 4 12/11/2017 10:46 AM   LDLCALC 158 (H) 12/11/2017 10:46 AM   LDLDIRECT 77.0 12/09/2016 08:42 AM    Wt Readings from Last 3 Encounters:  10/28/18 193 lb (87.5 kg)  10/27/18 194 lb 6.4 oz (88.2 kg)  10/06/18 190 lb 6.4 oz (86.4 kg)     Objective:    Vital Signs:  BP (!) 162/79   Pulse 62   Ht 5\' 1"  (1.549 m)   Wt 193 lb (87.5 kg)   BMI 36.47 kg/m    VITAL SIGNS:  reviewed NAD Answers questions appropriately Normal affect Remainder of physical examination not performed (telehealth visit; coronavirus pandemic)  ASSESSMENT & PLAN:    1. Status post mitral valve  repair-plan to continue SBE prophylaxis. 2. History of MS/MR and mild aortic stenosis/aortic insufficiency-repeat echocardiogram. 3. hypertension-patient's blood pressure is elevated; increase cardizem to 360 mg daily and follow. 4. Palpitations-continue cardizem and beta-blocker.   5. Hyperlipidemia-continue statin.  Managed by primary care. 6. Chronic diastolic congestive heart failure-based on history symptoms are reasonably well controlled.  Continue present dose of Lasix.  COVID-19 Education: The importance of social distancing was discussed today.  Time:   Today, I have spent 17 minutes with the patient with telehealth technology discussing the above problems.     Medication Adjustments/Labs and Tests Ordered: Current medicines are reviewed at length with the patient today.  Concerns regarding medicines are outlined above.   Tests Ordered: No orders of the defined types were placed in this encounter.   Medication Changes: No orders of the defined types were placed in this encounter.   Follow Up:  Virtual Visit or In Person in 1 year(s)  Signed, Kirk Ruths, MD  10/28/2018 7:50 AM    [place]

## 2018-10-26 ENCOUNTER — Encounter: Payer: Self-pay | Admitting: Family Medicine

## 2018-10-27 ENCOUNTER — Other Ambulatory Visit: Payer: Self-pay

## 2018-10-27 ENCOUNTER — Ambulatory Visit (INDEPENDENT_AMBULATORY_CARE_PROVIDER_SITE_OTHER): Payer: Medicare Other | Admitting: Physician Assistant

## 2018-10-27 ENCOUNTER — Encounter: Payer: Self-pay | Admitting: Physician Assistant

## 2018-10-27 VITALS — BP 148/74 | HR 71 | Resp 14 | Ht 61.0 in | Wt 194.4 lb

## 2018-10-27 DIAGNOSIS — M3501 Sicca syndrome with keratoconjunctivitis: Secondary | ICD-10-CM

## 2018-10-27 DIAGNOSIS — N183 Chronic kidney disease, stage 3 unspecified: Secondary | ICD-10-CM

## 2018-10-27 DIAGNOSIS — M797 Fibromyalgia: Secondary | ICD-10-CM | POA: Diagnosis not present

## 2018-10-27 DIAGNOSIS — Z8679 Personal history of other diseases of the circulatory system: Secondary | ICD-10-CM

## 2018-10-27 DIAGNOSIS — Z8639 Personal history of other endocrine, nutritional and metabolic disease: Secondary | ICD-10-CM

## 2018-10-27 DIAGNOSIS — Z86711 Personal history of pulmonary embolism: Secondary | ICD-10-CM

## 2018-10-27 DIAGNOSIS — G8929 Other chronic pain: Secondary | ICD-10-CM | POA: Diagnosis not present

## 2018-10-27 DIAGNOSIS — G4709 Other insomnia: Secondary | ICD-10-CM | POA: Diagnosis not present

## 2018-10-27 DIAGNOSIS — Z8659 Personal history of other mental and behavioral disorders: Secondary | ICD-10-CM

## 2018-10-27 DIAGNOSIS — R5383 Other fatigue: Secondary | ICD-10-CM

## 2018-10-27 DIAGNOSIS — Z79899 Other long term (current) drug therapy: Secondary | ICD-10-CM

## 2018-10-27 DIAGNOSIS — M5030 Other cervical disc degeneration, unspecified cervical region: Secondary | ICD-10-CM

## 2018-10-27 DIAGNOSIS — M8589 Other specified disorders of bone density and structure, multiple sites: Secondary | ICD-10-CM

## 2018-10-27 NOTE — Patient Instructions (Addendum)
Standing Labs We placed an order today for your standing lab work.    Please come back and get your standing labs in October   CBC, CMP, UA   We have open lab daily Monday through Thursday from 8:30-12:30 PM and 1:30-4:30 PM and Friday from 8:30-12:30 PM and 1:30 -4:00 PM at the office of Dr. Bo Merino.   You may experience shorter wait times on Monday and Friday afternoons. The office is located at [address], [place], [place], [place] [address] No appointment is necessary.   Labs are drawn by [facility].  You may receive a bill from [facility] for your lab work.  If you wish to have your labs drawn at another location, please call the office 24 hours in advance to send orders.  If you have any questions regarding directions or hours of operation,  please call [phone].   Just as a reminder please drink plenty of water prior to coming for your lab work. Thanks!     Hip Bursitis Rehab Ask your health care provider which exercises are safe for you. Do exercises exactly as told by your health care provider and adjust them as directed. It is normal to feel mild stretching, pulling, tightness, or discomfort as you do these exercises. Stop right away if you feel sudden pain or your pain gets worse. Do not begin these exercises until told by your health care provider. Stretching exercise This exercise warms up your muscles and joints and improves the movement and flexibility of your hip. This exercise also helps to relieve pain and stiffness. Iliotibial band stretch An iliotibial band is a strong band of muscle tissue that runs from the outer side of your hip to the outer side of your thigh and knee. 1. Lie on your side with your left / right leg in the top position. 2. Bend your left / right knee and grab your ankle. Stretch out your bottom arm to help you balance. 3. Slowly bring your knee back so your thigh is behind your body. 4. Slowly lower your knee toward the floor until  you feel a gentle stretch on the outside of your left / right thigh. If you do not feel a stretch and your knee will not fall farther, place the heel of your other foot on top of your knee and pull your knee down toward the floor with your foot. 5. Hold this position for __________ seconds. 6. Slowly return to the starting position. Repeat __________ times. Complete this exercise __________ times a day. Strengthening exercises These exercises build strength and endurance in your hip and pelvis. Endurance is the ability to use your muscles for a long time, even after they get tired. Bridge This exercise strengthens the muscles that move your thigh backward (hip extensors). 1. Lie on your back on a firm surface with your knees bent and your feet flat on the floor. 2. Tighten your buttocks muscles and lift your buttocks off the floor until your trunk is level with your thighs. ? Do not arch your back. ? You should feel the muscles working in your buttocks and the back of your thighs. If you do not feel these muscles, slide your feet 1-2 inches (2.5-5 cm) farther away from your buttocks. ? If this exercise is too easy, try doing it with your arms crossed over your chest. 3. Hold this position for __________ seconds. 4. Slowly lower your hips to the starting position. 5. Let your muscles relax completely after each  repetition. Repeat __________ times. Complete this exercise __________ times a day. Squats This exercise strengthens the muscles in front of your thigh and knee (quadriceps). 1. Stand in front of a table, with your feet and knees pointing straight ahead. You may rest your hands on the table for balance but not for support. 2. Slowly bend your knees and lower your hips like you are going to sit in a chair. ? Keep your weight over your heels, not over your toes. ? Keep your lower legs upright so they are parallel with the table legs. ? Do not let your hips go lower than your knees. ? Do  not bend lower than told by your health care provider. ? If your hip pain increases, do not bend as low. 3. Hold the squat position for __________ seconds. 4. Slowly push with your legs to return to standing. Do not use your hands to pull yourself to standing. Repeat __________ times. Complete this exercise __________ times a day. Hip hike 1. Stand sideways on a bottom step. Stand on your left / right leg with your other foot unsupported next to the step. You can hold on to the railing or wall for balance if needed. 2. Keep your knees straight and your torso square. Then lift your left / right hip up toward the ceiling. 3. Hold this position for __________ seconds. 4. Slowly let your left / right hip lower toward the floor, past the starting position. Your foot should get closer to the floor. Do not lean or bend your knees. Repeat __________ times. Complete this exercise __________ times a day. Single leg stand 1. Without shoes, stand near a railing or in a doorway. You may hold on to the railing or door frame as needed for balance. 2. Squeeze your left / right buttock muscles, then lift up your other foot. ? Do not let your left / right hip push out to the side. ? It is helpful to stand in front of a mirror for this exercise so you can watch your hip. 3. Hold this position for __________ seconds. Repeat __________ times. Complete this exercise __________ times a day. This information is not intended to replace advice given to you by your health care provider. Make sure you discuss any questions you have with your health care provider. Document Released: 04/11/2004 Document Revised: 06/29/2018 Document Reviewed: 06/29/2018 Elsevier Patient Education  2020 [employer].

## 2018-10-28 ENCOUNTER — Telehealth (INDEPENDENT_AMBULATORY_CARE_PROVIDER_SITE_OTHER): Payer: Medicare Other | Admitting: Cardiology

## 2018-10-28 VITALS — BP 162/79 | HR 62 | Ht 61.0 in | Wt 193.0 lb

## 2018-10-28 DIAGNOSIS — I10 Essential (primary) hypertension: Secondary | ICD-10-CM

## 2018-10-28 DIAGNOSIS — R002 Palpitations: Secondary | ICD-10-CM

## 2018-10-28 DIAGNOSIS — Z9889 Other specified postprocedural states: Secondary | ICD-10-CM | POA: Diagnosis not present

## 2018-10-28 DIAGNOSIS — E7800 Pure hypercholesterolemia, unspecified: Secondary | ICD-10-CM | POA: Diagnosis not present

## 2018-10-28 MED ORDER — DILTIAZEM HCL ER COATED BEADS 360 MG PO CP24: 360.0000 mg | ORAL_CAPSULE | Freq: Every day | ORAL | 3 refills | Status: DC

## 2018-10-28 MED ORDER — ASPIRIN EC 81 MG PO TBEC: 81.0000 mg | DELAYED_RELEASE_TABLET | Freq: Every day | ORAL | Status: DC

## 2018-10-28 NOTE — Patient Instructions (Signed)
Medication Instructions:  DECREASE ASPIRIN TO 81 MG ONCE DAILY  INCREASE DILTIAZEM TO 360 MG ONCE DAILY  If you need a refill on your cardiac medications before your next appointment, please call your pharmacy.   Lab work: If you have labs (blood work) drawn today and your tests are completely normal, you will receive your results only by: Marland Kitchen MyChart Message (if you have MyChart) OR . A paper copy in the mail If you have any lab test that is abnormal or we need to change your treatment, we will call you to review the results.  Testing/Procedures: Your physician has requested that you have an echocardiogram. Echocardiography is a painless test that uses sound waves to create images of your heart. It provides your doctor with information about the size and shape of your heart and how well your heart's chambers and valves are working. This procedure takes approximately one hour. There are no restrictions for this procedure.  [place]  Follow-Up: At [facility], you and your health needs are our priority.  As part of our continuing mission to provide you with exceptional heart care, we have created designated Provider Care Teams.  These Care Teams include your primary Cardiologist (physician) and Advanced Practice Providers (APPs -  Physician Assistants and Nurse Practitioners) who all work together to provide you with the care you need, when you need it. You will need a follow up appointment in 12 months.  Please call our office 2 months in advance to schedule this appointment.  You may see Kirk Ruths, MD or one of the following Advanced Practice Providers on your designated Care Team:   Kerin Ransom, PA-C Roby Lofts, [place] . Sande Rives, PA-C

## 2018-10-29 ENCOUNTER — Other Ambulatory Visit: Payer: Self-pay | Admitting: *Deleted

## 2018-10-29 ENCOUNTER — Other Ambulatory Visit: Payer: Self-pay

## 2018-10-29 ENCOUNTER — Ambulatory Visit (HOSPITAL_COMMUNITY): Payer: Medicare Other | Attending: Cardiovascular Disease

## 2018-10-29 DIAGNOSIS — Z9889 Other specified postprocedural states: Secondary | ICD-10-CM | POA: Diagnosis not present

## 2018-10-29 DIAGNOSIS — I342 Nonrheumatic mitral (valve) stenosis: Secondary | ICD-10-CM

## 2018-10-29 DIAGNOSIS — I059 Rheumatic mitral valve disease, unspecified: Secondary | ICD-10-CM | POA: Diagnosis present

## 2018-10-29 DIAGNOSIS — M797 Fibromyalgia: Secondary | ICD-10-CM | POA: Diagnosis not present

## 2018-10-29 DIAGNOSIS — I509 Heart failure, unspecified: Secondary | ICD-10-CM | POA: Diagnosis not present

## 2018-10-29 DIAGNOSIS — I083 Combined rheumatic disorders of mitral, aortic and tricuspid valves: Secondary | ICD-10-CM | POA: Diagnosis not present

## 2018-10-29 DIAGNOSIS — I110 Hypertensive heart disease with heart failure: Secondary | ICD-10-CM | POA: Insufficient documentation

## 2018-10-29 DIAGNOSIS — E785 Hyperlipidemia, unspecified: Secondary | ICD-10-CM | POA: Insufficient documentation

## 2018-11-25 ENCOUNTER — Ambulatory Visit (INDEPENDENT_AMBULATORY_CARE_PROVIDER_SITE_OTHER): Payer: Medicare Other | Admitting: Psychiatry

## 2018-11-25 ENCOUNTER — Encounter (HOSPITAL_COMMUNITY): Payer: Self-pay | Admitting: Psychiatry

## 2018-11-25 ENCOUNTER — Other Ambulatory Visit: Payer: Self-pay

## 2018-11-25 DIAGNOSIS — F331 Major depressive disorder, recurrent, moderate: Secondary | ICD-10-CM | POA: Diagnosis not present

## 2018-11-25 MED ORDER — ALPRAZOLAM 0.5 MG PO TABS: 0.5000 mg | ORAL_TABLET | Freq: Two times a day (BID) | ORAL | 2 refills | Status: DC

## 2018-11-25 MED ORDER — ESCITALOPRAM OXALATE 20 MG PO TABS: 20.0000 mg | ORAL_TABLET | Freq: Two times a day (BID) | ORAL | 2 refills | Status: DC

## 2018-11-25 MED ORDER — AMITRIPTYLINE HCL 25 MG PO TABS: 25.0000 mg | ORAL_TABLET | Freq: Every day | ORAL | 2 refills | Status: DC

## 2018-11-25 NOTE — Progress Notes (Signed)
Virtual Visit via Telephone Note  I connected with Alexandria Leach on 11/25/18 at  8:40 AM EDT by telephone and verified that I am speaking with the correct person using two identifiers.   I discussed the limitations, risks, security and privacy concerns of performing an evaluation and management service by telephone and the availability of in person appointments. I also discussed with the patient that there may be a patient responsible charge related to this service. The patient expressed understanding and agreed to proceed.      I discussed the assessment and treatment plan with the patient. The patient was provided an opportunity to ask questions and all were answered. The patient agreed with the plan and demonstrated an understanding of the instructions.   The patient was advised to call back or seek an in-person evaluation if the symptoms worsen or if the condition fails to improve as anticipated.  I provided 15 minutes of non-face-to-face time during this encounter.   Alexandria Spiller, MD  [facility] MD/PA/NP OP Progress Note  11/25/2018 9:01 AM [name]  MRN:  [id number]  Chief Complaint:  Chief Complaint    Depression; Anxiety; Follow-up     HPI: This patient is a 68 year old separated black female who lives with her son in [place].  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  She has 4 sons and 3 grandchildren.  The patient returns for follow-up regarding depression and anxiety.  She states for the most part she is doing well.  This is in spite of her Sjogren's getting a little bit worse and having excessive dryness in her mouth and eyes.  Also her daughter-in-law is slowly dying from metastatic breast cancer which has been very difficult for the whole family.  She is not been visiting there at the daughter in [place] request.  She is trying to lend support as much as she can.  Overall however she states  her mood is fairly good she is sleeping well and she denies symptoms of anxiety or panic attacks.  She is staying at home most of the time because of the pandemic but is still keeping in touch with numerous family members and friends by phone.  She still feels her medications for depression and anxiety have been very helpful Visit Diagnosis:    ICD-10-CM   1. Major depressive disorder, recurrent episode, moderate (HCC)  F33.1     Past Psychiatric History: none  Past Medical History:  Past Medical History:  Diagnosis Date  . Ankle fracture, left 02/19/2015   from a fall  . Anxiety   . Cataract 2014   corrected with surgery  . CHF (congestive heart failure) ([place])   . CKD (chronic kidney disease) stage 3, GFR 30-59 ml/min [facility])    saw nephrologist Dr Harden Mo  . DDD (degenerative disc disease), cervical   . Depression   . Fibromyalgia   . Glaucoma    s/p surgery, sees ophtho Q6 mo  . 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 History of kidney stones 2010  . History of pulmonary embolism 2001, 2006   completed coumadin courses  . History of rheumatic fever x3  . HLD (hyperlipidemia)   . HTN (hypertension)   . Insomnia   . Lung nodule 09/22/2012   RLL - 64mm, stable since 2014. Thought benign.   . Osteoarthritis    shoulders  and knees, not RA per Dr Estanislado Pandy, positive ANA, positive Ro  . Osteopenia 09/18/2015   DEXA T -1.1 hip, -0.2 spine 08/2015   . Personal history of urinary calculi latest 2014  . Pneumonia 12/02/2011  . PONV (postoperative nausea and vomiting)   . Refusal of blood transfusions as patient is Jehovah's Witness   . Rheumatic heart disease 1980   s/p mitral valve repair 1980  . Sjogren's syndrome [facility])     Past Surgical History:  Procedure Laterality Date  . BREAST BIOPSY Right 2006   benign  . CHOLECYSTECTOMY  11/27/2011   Procedure: LAPAROSCOPIC CHOLECYSTECTOMY WITH INTRAOPERATIVE CHOLANGIOGRAM;   Surgeon: Adin Hector, MD;  Location: [facility];  Service: General;  Laterality: N/A;  laparoscopic cholecystectomy with choleangiogram umbilical hernia repair  . COLONOSCOPY  07/2014   WNL Amedeo Plenty)  . dexa  08/2012   normal per patient - no records available  . EYE SURGERY Bilateral 2014   cataract removal  . MITRAL VALVE REPAIR  1980   open heart  . ORIF ANKLE FRACTURE Left 02/26/2015   Procedure: OPEN REDUCTION INTERNAL FIXATION (ORIF) LEFT TRIMALLEOLAR ANKLE FRACTURE;  Surgeon: [name], MD;  Location: [place];  Service: Orthopedics;  Laterality: Left;  . TUBAL LIGATION  1980  . UMBILICAL HERNIA REPAIR  11/27/2011   Procedure: HERNIA REPAIR UMBILICAL ADULT;  Surgeon: Adin Hector, MD;  Location: [place];  Service: General;  Laterality: N/A;  . VAGINAL HYSTERECTOMY  1992   for fibroids -- partial, ovaries remain    Family Psychiatric History: See below  Family History:  Family History  Problem Relation Age of Onset  . Lupus Sister        and niece  . Cancer Mother        lung (nonsmoker)  . CAD Mother        MI in her 63s  . ALS Mother   . Kidney disease Father   . Alcohol abuse Father   . Diabetes Father   . Cancer Brother        bone  . Diabetes Sister   . Stroke Sister   . Cancer Maternal Uncle        bone  . Depression Sister   . Kidney failure Other        on HD  . Diabetes Brother   . Heart attack Brother   . Stroke Maternal Grandmother     Social History:  Social History   Socioeconomic History  . Marital status: Married    Spouse name: [name]  . Number of children: 4  . Years of education: 93  . Highest education level: Not on file  Occupational History    Employer: UNEMPLOYED    Comment: Disability  Social Needs  . Financial resource strain: Not on file  . Food insecurity    Worry: Not on file    Inability: Not on file  . Transportation needs    Medical: Not on file    Non-medical: Not on file  Tobacco Use  . Smoking status: Never Smoker   . Smokeless tobacco: Never Used  Substance and Sexual Activity  . Alcohol use: No    Alcohol/week: 0.0 standard drinks  . Drug use: No  . Sexual activity: Not Currently    Birth control/protection: Surgical  Lifestyle  . Physical activity    Days per week: Not on file    Minutes per session: Not on file  . Stress: Not on file  Relationships  . Social Herbalist on phone: Not on file    Gets together: Not on file    Attends religious service: Not on file    Active member of club or organization: Not on file    Attends meetings of clubs or organizations: Not on file    Relationship status: Not on file  Other Topics Concern  . Not on file  Social History Narrative   Lives with son, 1 dog   Occupation: unemployed, on disability for fibromyalgia since 2008.   Edu: HS   Religion: Jehova's witness   Activity: volunteers at senior center   Diet: some water, fruits/vegetables daily   No caffeine use    Allergies:  Allergies  Allergen Reactions  . Cymbalta [Duloxetine Hcl] Other (See Comments)    tachycardia  . Statins Nausea Only and Other (See Comments)    Muscle cramps also  . Sulfa Antibiotics Nausea And Vomiting    Metabolic Disorder Labs: Lab Results  Component Value Date   HGBA1C 5.6 10/13/2015   MPG 114 10/13/2015   No results found for: PROLACTIN Lab Results  Component Value Date   CHOL 239 (H) 12/11/2017   TRIG 93.0 12/11/2017   HDL 62.20 12/11/2017   CHOLHDL 4 12/11/2017   VLDL 18.6 12/11/2017   LDLCALC 158 (H) 12/11/2017   LDLCALC 105 (H) 05/29/2016   Lab Results  Component Value Date   TSH 1.91 10/06/2018   TSH 3.55 04/03/2018    Therapeutic Level Labs: No results found for: LITHIUM No results found for: VALPROATE No components found for:  CBMZ  Current Medications: Current Outpatient Medications  Medication Sig Dispense Refill  . ALPRAZolam (XANAX) 0.5 MG tablet Take 1 tablet (0.5 mg total) by mouth 2 (two) times daily. 180  tablet 2  . amitriptyline (ELAVIL) 25 MG tablet Take 1 tablet (25 mg total) by mouth at bedtime. 90 tablet 2  . antiseptic oral rinse (BIOTENE) LIQD 15 mLs by Mouth Rinse route at bedtime.     Marland Kitchen aspirin 81 MG tablet Take 1 tablet (81 mg total) by mouth daily.    . cholecalciferol (VITAMIN D) 1000 UNITS tablet Take 1,000 Units by mouth daily.     Marland Kitchen Co-Enzyme Q-10 100 MG CAPS Take 1 capsule (100 mg total) by mouth daily.  0  . diclofenac sodium (VOLTAREN) 1 % GEL Apply 1 application topically 2 (two) times daily as needed (pain).    Marland Kitchen diltiazem (CARDIZEM CD) 360 MG 24 hr capsule Take 1 capsule (360 mg total) by mouth daily. 90 capsule 3  . escitalopram (LEXAPRO) 20 MG tablet Take 1 tablet (20 mg total) by mouth 2 (two) times daily. 180 tablet 2  . ezetimibe (ZETIA) 10 MG tablet TAKE 1 TABLET BY MOUTH AT  BEDTIME 90 tablet 2  . famotidine (PEPCID) 20 MG tablet TAKE 1 TABLET BY MOUTH TWO  TIMES DAILY 180 tablet 2  . furosemide (LASIX) 20 MG tablet TAKE 1 TABLET BY MOUTH  DAILY AS NEEDED FOR EDEMA 90 tablet 1  . gabapentin (NEURONTIN) 300 MG capsule TAKE 2 CAPSULES BY MOUTH  EVERY MORNING, 1 CAPSULE  EVERY AFTERNOON AND 2  CAPSULES AT BEDTIME. 450 capsule 1  . hydroxychloroquine (PLAQUENIL) 200 MG tablet TAKE 1 TABLET BY MOUTH  TWICE DAILY 180 tablet 0  . lovastatin (MEVACOR) 10 MG tablet TAKE 1 TABLET BY MOUTH  EVERY MONDAY, WEDNESDAY,  AND FRIDAY 40 tablet 3  . metoprolol tartrate (LOPRESSOR) 50 MG  tablet Take 1 tablet (50 mg total) by mouth 2 (two) times daily. 180 tablet 1  . Polyethyl Glycol-Propyl Glycol (SYSTANE) 0.4-0.3 % GEL Place 1 drop into both eyes 2 (two) times daily.     . RESTASIS 0.05 % ophthalmic emulsion INSTILL ONE DROP IN BOTH  EYES TWO TIMES DAILY 180 each 0  . traMADol (ULTRAM) 50 MG tablet Take 1 tablet (50 mg total) by mouth 3 (three) times daily. 60 tablet 0   No current facility-administered medications for this visit.      Musculoskeletal: Strength & Muscle Tone:  within normal limits Gait & Station: normal Patient leans: N/A  Psychiatric Specialty Exam: Review of Systems  Constitutional: Positive for malaise/fatigue.  Musculoskeletal: Positive for back pain, joint pain and myalgias.  All other systems reviewed and are negative.   There were no vitals taken for this visit.There is no height or weight on file to calculate BMI.  General Appearance: NA  Eye Contact:  NA  Speech:  Clear and Coherent  Volume:  Normal  Mood:  Euthymic  Affect:  NA  Thought Process:  Goal Directed  Orientation:  Full (Time, Place, and Person)  Thought Content: Rumination   Suicidal Thoughts:  No  Homicidal Thoughts:  No  Memory:  Immediate;   Good Recent;   Good Remote;   Good  Judgement:  Good  Insight:  Good  Psychomotor Activity:  Decreased  Concentration:  Concentration: Good and Attention Span: Good  Recall:  Good  Fund of Knowledge: Good  Language: Good  Akathisia:  No  Handed:  Right  AIMS (if indicated): not done  Assets:  Communication Skills Desire for Improvement Resilience Social Support Talents/Skills  ADL's:  Intact  Cognition: WNL  Sleep:  Good   Screenings: Mini-Mental     Clinical Support from 12/11/2017 in [place] at [place] from 12/09/2016 in [place] at [place] from 11/30/2015 in [place] at [facility]  Total Score (max 30 points )  20  19  18     PHQ2-9     Clinical Support from 12/11/2017 in [place] at [place] from 12/09/2016 in [place] at [place] from 11/30/2015 in [place] at [place] from 10/18/2014 in [place] at [facility] Total Score  0  2  4  3   PHQ-9 Total Score  0  4  13  9        Assessment and Plan: This patient is a 68 year old female with a history of depression and anxiety.  She continues to do well.  She will continue Lexapro  20 mg twice daily for depression, amitriptyline 25 mg at bedtime for sleep and depression and Xanax 0.5 mg twice daily for anxiety.  She will return to see me in 3 months   Alexandria Spiller, MD 11/25/2018, 9:01 AM

## 2018-12-09 ENCOUNTER — Other Ambulatory Visit: Payer: Self-pay | Admitting: Family Medicine

## 2018-12-15 ENCOUNTER — Other Ambulatory Visit (INDEPENDENT_AMBULATORY_CARE_PROVIDER_SITE_OTHER): Payer: Medicare Other

## 2018-12-15 ENCOUNTER — Other Ambulatory Visit: Payer: Self-pay | Admitting: Family Medicine

## 2018-12-15 ENCOUNTER — Other Ambulatory Visit: Payer: Self-pay

## 2018-12-15 DIAGNOSIS — E7800 Pure hypercholesterolemia, unspecified: Secondary | ICD-10-CM | POA: Diagnosis not present

## 2018-12-15 DIAGNOSIS — N183 Chronic kidney disease, stage 3 unspecified: Secondary | ICD-10-CM

## 2018-12-15 LAB — RENAL FUNCTION PANEL
Albumin: 4.2 g/dL (ref 3.5–5.2)
BUN: 7 mg/dL (ref 6–23)
CO2: 24 mEq/L (ref 19–32)
Calcium: 9.5 mg/dL (ref 8.4–10.5)
Chloride: 105 mEq/L (ref 96–112)
Creatinine, Ser: 1.2 mg/dL (ref 0.40–1.20)
GFR: 54.32 mL/min — ABNORMAL LOW (ref >60.00)
Glucose, Bld: 84 mg/dL (ref 70–99)
Phosphorus: 3.3 mg/dL (ref 2.3–4.6)
Potassium: 4.1 mEq/L (ref 3.5–5.1)
Sodium: 137 mEq/L (ref 135–145)

## 2018-12-15 LAB — LIPID PANEL
Cholesterol: 175 mg/dL (ref 0–200)
HDL: 80.4 mg/dL (ref >39.00)
LDL Cholesterol: 80 mg/dL (ref 0–99)
NonHDL: 94.1
Total CHOL/HDL Ratio: 2
Triglycerides: 70 mg/dL (ref 0.0–149.0)
VLDL: 14 mg/dL (ref 0.0–40.0)

## 2018-12-15 LAB — MICROALBUMIN / CREATININE URINE RATIO
Creatinine,U: 58 mg/dL
Microalb Creat Ratio: 1.2 mg/g (ref 0.0–30.0)
Microalb, Ur: <0.7 mg/dL (ref 0.0–1.9)

## 2018-12-15 LAB — VITAMIN D 25 HYDROXY (VIT D DEFICIENCY, FRACTURES): VITD: 41.56 ng/mL (ref 30.00–100.00)

## 2018-12-21 ENCOUNTER — Ambulatory Visit (INDEPENDENT_AMBULATORY_CARE_PROVIDER_SITE_OTHER): Payer: Medicare Other

## 2018-12-21 DIAGNOSIS — Z0000 Encounter for general adult medical examination without abnormal findings: Secondary | ICD-10-CM

## 2018-12-21 NOTE — Patient Instructions (Signed)
Alexandria Leach , Thank you for taking time to come for your Medicare Wellness Visit. I appreciate your ongoing commitment to your health goals. Please review the following plan we discussed and let me know if I can assist you in the future.   Screening recommendations/referrals: Colonoscopy: up to date, completed 07/27/2014 Mammogram: up to date, completed 10/23/2018 Bone Density: up to date, completed 09/14/2015 Recommended yearly ophthalmology/optometry visit for glaucoma screening and checkup Recommended yearly dental visit for hygiene and checkup  Vaccinations: Influenza vaccine: declined Pneumococcal vaccine: will receive at upcoming physical  Tdap vaccine: declined Shingles vaccine: declined    Advanced directives: copy in chart  Conditions/risks identified: hypertension  Next appointment: 12/22/2018 @ 9:30 am    Preventive Care 66 Years and Older, Female Preventive care refers to lifestyle choices and visits with your health care provider that can promote health and wellness. What does preventive care include?  A yearly physical exam. This is also called an annual well check.  Dental exams once or twice a year.  Routine eye exams. Ask your health care provider how often you should have your eyes checked.  Personal lifestyle choices, including:  Daily care of your teeth and gums.  Regular physical activity.  Eating a healthy diet.  Avoiding tobacco and drug use.  Limiting alcohol use.  Practicing safe sex.  Taking low-dose aspirin every day.  Taking vitamin and mineral supplements as recommended by your health care provider. What happens during an annual well check? The services and screenings done by your health care provider during your annual well check will depend on your age, overall health, lifestyle risk factors, and family history of disease. Counseling  Your health care provider may ask you questions about your:  Alcohol use.  Tobacco use.  Drug use.   Emotional well-being.  Home and relationship well-being.  Sexual activity.  Eating habits.  History of falls.  Memory and ability to understand (cognition).  Work and work Statistician.  Reproductive health. Screening  You may have the following tests or measurements:  Height, weight, and BMI.  Blood pressure.  Lipid and cholesterol levels. These may be checked every 5 years, or more frequently if you are over 42 years old.  Skin check.  Lung cancer screening. You may have this screening every year starting at age 28 if you have a 30-pack-year history of smoking and currently smoke or have quit within the past 15 years.  Fecal occult blood test (FOBT) of the stool. You may have this test every year starting at age 66.  Flexible sigmoidoscopy or colonoscopy. You may have a sigmoidoscopy every 5 years or a colonoscopy every 10 years starting at age 81.  Hepatitis C blood test.  Hepatitis B blood test.  Sexually transmitted disease (STD) testing.  Diabetes screening. This is done by checking your blood sugar (glucose) after you have not eaten for a while (fasting). You may have this done every 1-3 years.  Bone density scan. This is done to screen for osteoporosis. You may have this done starting at age 14.  Mammogram. This may be done every 1-2 years. Talk to your health care provider about how often you should have regular mammograms. Talk with your health care provider about your test results, treatment options, and if necessary, the need for more tests. Vaccines  Your health care provider may recommend certain vaccines, such as:  Influenza vaccine. This is recommended every year.  Tetanus, diphtheria, and acellular pertussis (Tdap, Td) vaccine. You may need  a Td booster every 10 years.  Zoster vaccine. You may need this after age 1.  Pneumococcal 13-valent conjugate (PCV13) vaccine. One dose is recommended after age 75.  Pneumococcal polysaccharide (PPSV23)  vaccine. One dose is recommended after age 61. Talk to your health care provider about which screenings and vaccines you need and how often you need them. This information is not intended to replace advice given to you by your health care provider. Make sure you discuss any questions you have with your health care provider. Document Released: 03/31/2015 Document Revised: 11/22/2015 Document Reviewed: 01/03/2015 Elsevier Interactive Patient Education  2017 [employer] Prevention in the Home Falls can cause injuries. They can happen to people of all ages. There are many things you can do to make your home safe and to help prevent falls. What can I do on the outside of my home?  Regularly fix the edges of walkways and driveways and fix any cracks.  Remove anything that might make you trip as you walk through a door, such as a raised step or threshold.  Trim any bushes or trees on the path to your home.  Use bright outdoor lighting.  Clear any walking paths of anything that might make someone trip, such as rocks or tools.  Regularly check to see if handrails are loose or broken. Make sure that both sides of any steps have handrails.  Any raised decks and porches should have guardrails on the edges.  Have any leaves, snow, or ice cleared regularly.  Use sand or salt on walking paths during winter.  Clean up any spills in your garage right away. This includes oil or grease spills. What can I do in the bathroom?  Use night lights.  Install grab bars by the toilet and in the tub and shower. Do not use towel bars as grab bars.  Use non-skid mats or decals in the tub or shower.  If you need to sit down in the shower, use a plastic, non-slip stool.  Keep the floor dry. Clean up any water that spills on the floor as soon as it happens.  Remove soap buildup in the tub or shower regularly.  Attach bath mats securely with double-sided non-slip rug tape.  Do not have throw rugs  and other things on the floor that can make you trip. What can I do in the bedroom?  Use night lights.  Make sure that you have a light by your bed that is easy to reach.  Do not use any sheets or blankets that are too big for your bed. They should not hang down onto the floor.  Have a firm chair that has side arms. You can use this for support while you get dressed.  Do not have throw rugs and other things on the floor that can make you trip. What can I do in the kitchen?  Clean up any spills right away.  Avoid walking on wet floors.  Keep items that you use a lot in easy-to-reach places.  If you need to reach something above you, use a strong step stool that has a grab bar.  Keep electrical cords out of the way.  Do not use floor polish or wax that makes floors slippery. If you must use wax, use non-skid floor wax.  Do not have throw rugs and other things on the floor that can make you trip. What can I do with my stairs?  Do not leave any items on the  stairs.  Make sure that there are handrails on both sides of the stairs and use them. Fix handrails that are broken or loose. Make sure that handrails are as long as the stairways.  Check any carpeting to make sure that it is firmly attached to the stairs. Fix any carpet that is loose or worn.  Avoid having throw rugs at the top or bottom of the stairs. If you do have throw rugs, attach them to the floor with carpet tape.  Make sure that you have a light switch at the top of the stairs and the bottom of the stairs. If you do not have them, ask someone to add them for you. What else can I do to help prevent falls?  Wear shoes that:  Do not have high heels.  Have rubber bottoms.  Are comfortable and fit you well.  Are closed at the toe. Do not wear sandals.  If you use a stepladder:  Make sure that it is fully opened. Do not climb a closed stepladder.  Make sure that both sides of the stepladder are locked into  place.  Ask someone to hold it for you, if possible.  Clearly mark and make sure that you can see:  Any grab bars or handrails.  First and last steps.  Where the edge of each step is.  Use tools that help you move around (mobility aids) if they are needed. These include:  Canes.  Walkers.  Scooters.  Crutches.  Turn on the lights when you go into a dark area. Replace any light bulbs as soon as they burn out.  Set up your furniture so you have a clear path. Avoid moving your furniture around.  If any of your floors are uneven, fix them.  If there are any pets around you, be aware of where they are.  Review your medicines with your doctor. Some medicines can make you feel dizzy. This can increase your chance of falling. Ask your doctor what other things that you can do to help prevent falls. This information is not intended to replace advice given to you by your health care provider. Make sure you discuss any questions you have with your health care provider. Document Released: 12/29/2008 Document Revised: 08/10/2015 Document Reviewed: 04/08/2014 Elsevier Interactive Patient Education  2017 [employer].

## 2018-12-21 NOTE — Progress Notes (Signed)
PCP notes: none  Health Maintenance: Declined flu, Tdap and Shingrix vaccines. Patient will receive pneumovax vaccine at her upcoming appointment.     Abnormal Screenings: none    Patient concerns: none    Nurse concerns: none    Next PCP appt.: 12/22/2018 @ 9:30 am

## 2018-12-21 NOTE — Progress Notes (Signed)
Subjective:   Natahsa Daniele is a 68 y.o. female who presents for Medicare Annual (Subsequent) preventive examination.  Review of Systems:    This visit is being conducted through telemedicine via telephone at the nurse health advisor's home address due to the COVID-19 pandemic. This patient has given me verbal consent via doximity to conduct this visit, patient states they are participating from their home address. Some vital signs may be absent or patient reported.    Patient identification: identified by name, DOB, and current address  Cardiac Risk Factors include: advanced age (>59men, >88 women);hypertension;sedentary lifestyle     Objective:     Vitals: There were no vitals taken for this visit.  There is no height or weight on file to calculate BMI.  Advanced Directives 12/21/2018 12/11/2017 12/09/2016 11/15/2016 12/13/2015 12/13/2015 11/30/2015  Does Patient Have a Medical Advance Directive? Yes Yes Yes Yes Yes Yes Yes  Type of Paramedic of [place];Living will [place];Living will [place];Living will Living will;Healthcare Power of [place];Living will [place];Living will  Does patient want to make changes to medical advance directive? - - - - No - Patient declined - No - Patient declined  Copy of [place] in Chart? Yes - validated most recent copy scanned in chart (See row information) Yes Yes No - copy requested Yes Yes Yes  Would patient like information on creating a medical advance directive? - - - - - - -  Pre-existing out of facility DNR order (yellow form or pink MOST form) - - - - - - -    Tobacco Social History   Tobacco Use  Smoking Status Never Smoker  Smokeless Tobacco Never Used     Counseling given: Not Answered   Clinical Intake:  Pre-visit preparation completed: Yes  Pain : 0-10 Pain  Score: 10-Worst pain ever Pain Type: Chronic pain Pain Location: Back Pain Orientation: Lower Pain Descriptors / Indicators: Aching Pain Onset: More than a month ago Pain Frequency: Intermittent Pain Relieving Factors: Tramadol  Pain Relieving Factors: Tramadol  Nutritional Risks: None Diabetes: No  How often do you need to have someone help you when you read instructions, pamphlets, or other written materials from your doctor or pharmacy?: 1 - Never What is the last grade level you completed in school?: 12th  Interpreter Needed?: No  Information entered by :: CJohnson, LPN  Past Medical History:  Diagnosis Date  . Ankle fracture, left 02/19/2015   from a fall  . Anxiety   . Cataract 2014   corrected with surgery  . CHF (congestive heart failure) ([place])   . CKD (chronic kidney disease) stage 3, GFR 30-59 ml/min    saw nephrologist Dr Harden Mo  . DDD (degenerative disc disease), cervical   . Depression   . Fibromyalgia   . Glaucoma    s/p surgery, sees ophtho Q6 mo  . 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 History of kidney stones 2010  . History of pulmonary embolism 2001, 2006   completed coumadin courses  . History of rheumatic fever x3  . HLD (hyperlipidemia)   . HTN (hypertension)   . Insomnia   . Lung nodule 09/22/2012   RLL - 55mm, stable since 2014. Thought benign.   . Osteoarthritis    shoulders and knees, not RA per Dr Estanislado Pandy, positive  ANA, positive Ro  . Osteopenia 09/18/2015   DEXA T -1.1 hip, -0.2 spine 08/2015   . Personal history of urinary calculi latest 2014  . Pneumonia 12/02/2011  . PONV (postoperative nausea and vomiting)   . Refusal of blood transfusions as patient is Jehovah's Witness   . Rheumatic heart disease 1980   s/p mitral valve repair 1980  . Sjogren's syndrome [facility])    Past Surgical History:  Procedure Laterality Date  . BREAST BIOPSY Right 2006   benign  .  CHOLECYSTECTOMY  11/27/2011   Procedure: LAPAROSCOPIC CHOLECYSTECTOMY WITH INTRAOPERATIVE CHOLANGIOGRAM;  Surgeon: Adin Hector, MD;  Location: [place];  Service: General;  Laterality: N/A;  laparoscopic cholecystectomy with choleangiogram umbilical hernia repair  . COLONOSCOPY  07/2014   WNL Amedeo Plenty)  . dexa  08/2012   normal per patient - no records available  . EYE SURGERY Bilateral 2014   cataract removal  . MITRAL VALVE REPAIR  1980   open heart  . ORIF ANKLE FRACTURE Left 02/26/2015   Procedure: OPEN REDUCTION INTERNAL FIXATION (ORIF) LEFT TRIMALLEOLAR ANKLE FRACTURE;  Surgeon: [name], MD;  Location: [place];  Service: Orthopedics;  Laterality: Left;  . TUBAL LIGATION  1980  . UMBILICAL HERNIA REPAIR  11/27/2011   Procedure: HERNIA REPAIR UMBILICAL ADULT;  Surgeon: Adin Hector, MD;  Location: [place];  Service: General;  Laterality: N/A;  . [place]   for fibroids -- partial, ovaries remain   Family History  Problem Relation Age of Onset  . Lupus Sister        and niece  . Cancer Mother        lung (nonsmoker)  . CAD Mother        MI in her 32s  . ALS Mother   . Kidney disease Father   . Alcohol abuse Father   . Diabetes Father   . Cancer Brother        bone  . Diabetes Sister   . Stroke Sister   . Cancer Maternal Uncle        bone  . Depression Sister   . Kidney failure Other        on HD  . Diabetes Brother   . Heart attack Brother   . Stroke Maternal Grandmother    Social History   Socioeconomic History  . Marital status: Married    Spouse name: [name]  . Number of children: 4  . Years of education: 78  . Highest education level: Not on file  Occupational History    Employer: UNEMPLOYED    Comment: Disability  Social Needs  . Financial resource strain: Not hard at all  . Food insecurity    Worry: Never true    Inability: Never true  . Transportation needs    Medical: No    Non-medical: No  Tobacco Use  . Smoking status:  Never Smoker  . Smokeless tobacco: Never Used  Substance and Sexual Activity  . Alcohol use: No    Alcohol/week: 0.0 standard drinks  . Drug use: No  . Sexual activity: Not Currently    Birth control/protection: Surgical  Lifestyle  . Physical activity    Days per week: 0 days    Minutes per session: 0 min  . Stress: To some extent  Relationships  . Social Herbalist on phone: Not on file    Gets together: Not on file    Attends religious service: Not  on file    Active member of club or organization: Not on file    Attends meetings of clubs or organizations: Not on file    Relationship status: Not on file  Other Topics Concern  . Not on file  Social History Narrative   Lives with son, 1 dog   Occupation: unemployed, on disability for fibromyalgia since 2008.   Edu: HS   Religion: Jehova's witness   Activity: volunteers at senior center   Diet: some water, fruits/vegetables daily   No caffeine use    Outpatient Encounter Medications as of 12/21/2018  Medication Sig  . ALPRAZolam (XANAX) 0.5 MG tablet Take 1 tablet (0.5 mg total) by mouth 2 (two) times daily.  Marland Kitchen amitriptyline (ELAVIL) 25 MG tablet Take 1 tablet (25 mg total) by mouth at bedtime.  Marland Kitchen antiseptic oral rinse (BIOTENE) LIQD 15 mLs by Mouth Rinse route at bedtime.   Marland Kitchen aspirin 81 MG tablet Take 1 tablet (81 mg total) by mouth daily.  . cholecalciferol (VITAMIN D) 1000 UNITS tablet Take 1,000 Units by mouth daily.   Marland Kitchen Co-Enzyme Q-10 100 MG CAPS Take 1 capsule (100 mg total) by mouth daily.  . diclofenac sodium (VOLTAREN) 1 % GEL Apply 1 application topically 2 (two) times daily as needed (pain).  Marland Kitchen diltiazem (CARDIZEM CD) 360 MG 24 hr capsule Take 1 capsule (360 mg total) by mouth daily.  Marland Kitchen escitalopram (LEXAPRO) 20 MG tablet Take 1 tablet (20 mg total) by mouth 2 (two) times daily.  Marland Kitchen ezetimibe (ZETIA) 10 MG tablet TAKE 1 TABLET BY MOUTH AT  BEDTIME  . famotidine (PEPCID) 20 MG tablet TAKE 1 TABLET BY  MOUTH TWO  TIMES DAILY  . furosemide (LASIX) 20 MG tablet TAKE 1 TABLET BY MOUTH  DAILY AS NEEDED FOR EDEMA  . gabapentin (NEURONTIN) 300 MG capsule TAKE 2 CAPSULES BY MOUTH  EVERY MORNING, 1 CAPSULE  EVERY AFTERNOON AND 2  CAPSULES AT BEDTIME.  . hydroxychloroquine (PLAQUENIL) 200 MG tablet TAKE 1 TABLET BY MOUTH  TWICE DAILY  . lovastatin (MEVACOR) 10 MG tablet TAKE 1 TABLET BY MOUTH  EVERY MONDAY, WEDNESDAY,  AND FRIDAY  . Polyethyl Glycol-Propyl Glycol (SYSTANE) 0.4-0.3 % GEL Place 1 drop into both eyes 2 (two) times daily.   . RESTASIS 0.05 % ophthalmic emulsion INSTILL ONE DROP IN BOTH  EYES TWO TIMES DAILY  . traMADol (ULTRAM) 50 MG tablet Take 1 tablet (50 mg total) by mouth 3 (three) times daily.  . metoprolol tartrate (LOPRESSOR) 50 MG tablet Take 1 tablet (50 mg total) by mouth 2 (two) times daily.   No facility-administered encounter medications on file as of 12/21/2018.     Activities of Daily Living In your present state of health, do you have any difficulty performing the following activities: 12/21/2018  Hearing? N  Vision? N  Difficulty concentrating or making decisions? Y  Comment Patient has some memory loss with medications  Walking or climbing stairs? Y  Dressing or bathing? N  Doing errands, shopping? N  Preparing Food and eating ? N  Using the Toilet? N  In the past six months, have you accidently leaked urine? N  Do you have problems with loss of bowel control? N  Managing your Medications? N  Managing your Finances? N  Housekeeping or managing your Housekeeping? N  Some recent data might be hidden    Patient Care Team: Ria Bush, MD as PCP - General (Family Medicine) Stanford Breed [name], MD as PCP - Cardiology (  Cardiology) Stanford Breed [name], MD as Consulting Physician (Cardiology) Bo Merino, MD as Consulting Physician (Rheumatology) Cloria Spring, MD as Consulting Physician ([place]) Sharyne Peach, MD as Consulting Physician  (Ophthalmology) Penni Bombard, MD as Consulting Physician (Neurology)    Assessment:   This is a routine wellness examination for Jazleen.  Exercise Activities and Dietary recommendations Current Exercise Habits: The patient does not participate in regular exercise at present, Exercise limited by: orthopedic condition(s)  Goals    . Increase physical activity     Starting 12/11/2017, I will continue to walk at least 60 min day/4 days per week.     . Patient Stated     12/21/2018, Patient wants to improve her ability to walk better and increase her mobility.        Fall Risk Fall Risk  12/21/2018 12/11/2017 12/09/2016 11/30/2015 11/30/2015  Falls in the past year? 1 Yes No - Yes  Comment - lost balance and fell in August 2019 - - -  Number falls in past yr: 1 1 - - 2 or more  Comment - - - - -  Injury with Fall? 0 Yes - - Yes  Comment - laceration to forehead; treatment in urgent care - - -  Risk Factor Category  - - - (No Data) High Fall Risk  Comment - - - pt reports she has had 11 falls between 2016 and 2017 -  Risk for fall due to : History of fall(s);Medication side effect;Impaired balance/gait;Impaired mobility - - - -  Follow up Falls evaluation completed;Falls prevention discussed - - - Falls evaluation completed;Education provided;Falls prevention discussed   Is the patient's home free of loose throw rugs in walkways, pet beds, electrical cords, etc?   yes      Grab bars in the bathroom? no      Handrails on the stairs?   yes      Adequate lighting?   yes  Timed Get Up and Go performed: n/a  Depression Screen PHQ 2/9 Scores 12/21/2018 12/11/2017 12/09/2016 11/30/2015  PHQ - 2 Score 1 0 2 4  PHQ- 9 Score 1 0 4 13     Cognitive Function MMSE - Mini Mental State Exam 12/21/2018 12/11/2017 12/09/2016 11/30/2015  Orientation to time 4 5 5 5   Orientation to Place 5 5 5 5   Registration 3 3 3 3   Attention/ Calculation 5 0 0 0  Recall 2 3 2 1   Recall-comments - - unable to  recall 1 of 3 words pt was unable to recall 2 of 3 words  Language- name 2 objects - 0 0 0  Language- repeat 1 1 1 1   Language- follow 3 step command - 3 3 3   Language- read & follow direction - 0 0 0  Write a sentence - 0 0 0  Copy design - 0 0 0  Total score - 20 19 18   Mini Cog  Mini-Cog screen was completed. Maximum score is 22. A value of 0 denotes this part of the MMSE was not completed or the patient failed this part of the Mini-Cog screening.      Immunization History  Administered Date(s) Administered  . Pneumococcal Conjugate-13 12/15/2017  . Pneumococcal Polysaccharide-23 09/15/2012    Qualifies for Shingles Vaccine? yes  Screening Tests Health Maintenance  Topic Date Due  . PNA vac Low Risk Adult (2 of 2 - PPSV23) 12/16/2018  . DTaP/Tdap/Td (1 - Tdap) 11/29/2025 (Originally 07/25/1969)  . TETANUS/TDAP  11/29/2025 (Originally  07/25/1969)  . INFLUENZA VACCINE  03/17/2049 (Originally 10/17/2018)  . MAMMOGRAM  10/23/2019  . URINE MICROALBUMIN  12/15/2019  . COLONOSCOPY  07/26/2024  . DEXA SCAN  Completed  . Hepatitis C Screening  Completed    Cancer Screenings: Lung: Low Dose CT Chest recommended if Age 34-80 years, 30 pack-year currently smoking OR have quit w/in 15years. Patient does not qualify. Breast:  Up to date on Mammogram? Yes,completed 10/23/2018  Up to date of Bone Density/Dexa? Yes, completed 09/14/2015 Colorectal: completed 07/27/2014  Additional Screenings:  Hepatitis C Screening: 08/27/2006     Plan:    Patient wants to improve her ability to walk better and increase her mobility.   I have personally reviewed and noted the following in the patient's chart:   . Medical and social history . Use of alcohol, tobacco or illicit drugs  . Current medications and supplements . Functional ability and status . Nutritional status . Physical activity . Advanced directives . List of other physicians . Hospitalizations, surgeries, and ER visits in previous 12  months . Vitals . Screenings to include cognitive, depression, and falls . Referrals and appointments  In addition, I have reviewed and discussed with patient certain preventive protocols, quality metrics, and best practice recommendations. A written personalized care plan for preventive services as well as general preventive health recommendations were provided to patient.     Andrez Grime, LPN  [id number]

## 2018-12-22 ENCOUNTER — Other Ambulatory Visit: Payer: Self-pay

## 2018-12-22 ENCOUNTER — Encounter: Payer: Self-pay | Admitting: Family Medicine

## 2018-12-22 ENCOUNTER — Ambulatory Visit: Payer: Self-pay

## 2018-12-22 ENCOUNTER — Ambulatory Visit (INDEPENDENT_AMBULATORY_CARE_PROVIDER_SITE_OTHER): Payer: Medicare Other | Admitting: Family Medicine

## 2018-12-22 VITALS — BP 144/82 | HR 73 | Temp 98.4°F | Ht 61.5 in | Wt 188.6 lb

## 2018-12-22 DIAGNOSIS — I10 Essential (primary) hypertension: Secondary | ICD-10-CM

## 2018-12-22 DIAGNOSIS — I130 Hypertensive heart and chronic kidney disease with heart failure and stage 1 through stage 4 chronic kidney disease, or unspecified chronic kidney disease: Secondary | ICD-10-CM | POA: Diagnosis not present

## 2018-12-22 DIAGNOSIS — Z0000 Encounter for general adult medical examination without abnormal findings: Secondary | ICD-10-CM

## 2018-12-22 DIAGNOSIS — Z23 Encounter for immunization: Secondary | ICD-10-CM

## 2018-12-22 DIAGNOSIS — F331 Major depressive disorder, recurrent, moderate: Secondary | ICD-10-CM

## 2018-12-22 DIAGNOSIS — Z7189 Other specified counseling: Secondary | ICD-10-CM | POA: Diagnosis not present

## 2018-12-22 DIAGNOSIS — I350 Nonrheumatic aortic (valve) stenosis: Secondary | ICD-10-CM

## 2018-12-22 DIAGNOSIS — M1612 Unilateral primary osteoarthritis, left hip: Secondary | ICD-10-CM

## 2018-12-22 DIAGNOSIS — I5032 Chronic diastolic (congestive) heart failure: Secondary | ICD-10-CM | POA: Diagnosis not present

## 2018-12-22 DIAGNOSIS — N1831 Chronic kidney disease, stage 3a: Secondary | ICD-10-CM | POA: Diagnosis not present

## 2018-12-22 DIAGNOSIS — M3500 Sicca syndrome, unspecified: Secondary | ICD-10-CM

## 2018-12-22 DIAGNOSIS — E7800 Pure hypercholesterolemia, unspecified: Secondary | ICD-10-CM

## 2018-12-22 DIAGNOSIS — M797 Fibromyalgia: Secondary | ICD-10-CM

## 2018-12-22 DIAGNOSIS — M8589 Other specified disorders of bone density and structure, multiple sites: Secondary | ICD-10-CM

## 2018-12-22 DIAGNOSIS — I739 Peripheral vascular disease, unspecified: Secondary | ICD-10-CM

## 2018-12-22 DIAGNOSIS — R109 Unspecified abdominal pain: Secondary | ICD-10-CM

## 2018-12-22 DIAGNOSIS — Z531 Procedure and treatment not carried out because of patient's decision for reasons of belief and group pressure: Secondary | ICD-10-CM

## 2018-12-22 NOTE — Patient Instructions (Addendum)
Pneumovax today (final pneumonia shot).  Labs were looking good. You are doing well today Continue current medicines.  Return in 6 months for follow up visit.  Health Maintenance After Age 66 After age 30, you are at a higher risk for certain long-term diseases and infections as well as injuries from falls. Falls are a major cause of broken bones and head injuries in people who are older than age 8. Getting regular preventive care can help to keep you healthy and well. Preventive care includes getting regular testing and making lifestyle changes as recommended by your health care provider. Talk with your health care provider about:  Which screenings and tests you should have. A screening is a test that checks for a disease when you have no symptoms.  A diet and exercise plan that is right for you. What should I know about screenings and tests to prevent falls? Screening and testing are the best ways to find a health problem early. Early diagnosis and treatment give you the best chance of managing medical conditions that are common after age 82. Certain conditions and lifestyle choices may make you more likely to have a fall. Your health care provider may recommend:  Regular vision checks. Poor vision and conditions such as cataracts can make you more likely to have a fall. If you wear glasses, make sure to get your prescription updated if your vision changes.  Medicine review. Work with your health care provider to regularly review all of the medicines you are taking, including over-the-counter medicines. Ask your health care provider about any side effects that may make you more likely to have a fall. Tell your health care provider if any medicines that you take make you feel dizzy or sleepy.  Osteoporosis screening. Osteoporosis is a condition that causes the bones to get weaker. This can make the bones weak and cause them to break more easily.  Blood pressure screening. Blood pressure  changes and medicines to control blood pressure can make you feel dizzy.  Strength and balance checks. Your health care provider may recommend certain tests to check your strength and balance while standing, walking, or changing positions.  Foot health exam. Foot pain and numbness, as well as not wearing proper footwear, can make you more likely to have a fall.  Depression screening. You may be more likely to have a fall if you have a fear of falling, feel emotionally low, or feel unable to do activities that you used to do.  Alcohol use screening. Using too much alcohol can affect your balance and may make you more likely to have a fall. What actions can I take to lower my risk of falls? General instructions  Talk with your health care provider about your risks for falling. Tell your health care provider if: ? You fall. Be sure to tell your health care provider about all falls, even ones that seem minor. ? You feel dizzy, sleepy, or off-balance.  Take over-the-counter and prescription medicines only as told by your health care provider. These include any supplements.  Eat a healthy diet and maintain a healthy weight. A healthy diet includes low-fat dairy products, low-fat (lean) meats, and fiber from whole grains, beans, and lots of fruits and vegetables. Home safety  Remove any tripping hazards, such as rugs, cords, and clutter.  Install safety equipment such as grab bars in bathrooms and safety rails on stairs.  Keep rooms and walkways well-lit. Activity   Follow a regular exercise program to stay  fit. This will help you maintain your balance. Ask your health care provider what types of exercise are appropriate for you.  If you need a cane or walker, use it as recommended by your health care provider.  Wear supportive shoes that have nonskid soles. Lifestyle  Do not drink alcohol if your health care provider tells you not to drink.  If you drink alcohol, limit how much you  have: ? 0-1 drink a day for women. ? 0-2 drinks a day for men.  Be aware of how much alcohol is in your drink. In the [place] one drink equals one typical bottle of beer (12 oz), one-half glass of wine (5 oz), or one shot of hard liquor (1 oz).  Do not use any products that contain nicotine or tobacco, such as cigarettes and e-cigarettes. If you need help quitting, ask your health care provider. Summary  Having a healthy lifestyle and getting preventive care can help to protect your health and wellness after age 17.  Screening and testing are the best way to find a health problem early and help you avoid having a fall. Early diagnosis and treatment give you the best chance for managing medical conditions that are more common for people who are older than age 13.  Falls are a major cause of broken bones and head injuries in people who are older than age 50. Take precautions to prevent a fall at home.  Work with your health care provider to learn what changes you can make to improve your health and wellness and to prevent falls. This information is not intended to replace advice given to you by your health care provider. Make sure you discuss any questions you have with your health care provider. Document Released: 01/15/2017 Document Revised: 06/25/2018 Document Reviewed: 01/15/2017 Elsevier Patient Education  2020 [employer].

## 2018-12-22 NOTE — Assessment & Plan Note (Addendum)
Advanced directive discussion - brought forms 2017. Son Rodney Pegg is HCPOA then Larry. Clear she does not want CPR, no intubation. Has DNR form at home. Jehovah's witness - no blood products.  

## 2018-12-22 NOTE — Assessment & Plan Note (Signed)
Preventative protocols reviewed and updated unless pt declined. Discussed healthy diet and lifestyle.  

## 2018-12-22 NOTE — Assessment & Plan Note (Signed)
Stable period, some improvement noted since last check.

## 2018-12-22 NOTE — Progress Notes (Signed)
This visit was conducted in person.  BP (!) 144/82 (BP Location: Right Arm, Patient Position: Sitting, Cuff Size: Large)   Pulse 73   Temp 98.4 F (36.9 C) (Temporal)   Ht 5' 1.5" (1.562 m)   Wt 188 lb 9 oz (85.5 kg)   SpO2 95%   BMI 35.05 kg/m    CC: CPE Subjective:    Patient ID: Alexandria Leach, female    DOB: [birth year], 68 y.o.   MRN: [id number]  HPI: Alexandria Leach is a 68 y.o. female presenting on 12/22/2018 for Annual Exam (Prt 2. )   Saw health advisor yesterday for medicare wellness visit. Note reviewed.  She feels mood is doing overall good.   Brother and sister both died last week. Sister may have had Covid19. Brother may have had MI.   No exam data present    Office Visit from 12/22/2018 in [place] at [facility] Total Score  0      Fall Risk  12/21/2018 12/11/2017 12/09/2016 11/30/2015 11/30/2015  Falls in the past year? 1 Yes No - Yes  Comment - lost balance and fell in August 2019 - - -  Number falls in past yr: 1 1 - - 2 or more  Comment - - - - -  Injury with Fall? 0 Yes - - Yes  Comment - laceration to forehead; treatment in urgent care - - -  Risk Factor Category  - - - (No Data) High Fall Risk  Comment - - - pt reports she has had 11 falls between 2016 and 2017 -  Risk for fall due to : History of fall(s);Medication side effect;Impaired balance/gait;Impaired mobility - - - -  Follow up Falls evaluation completed;Falls prevention discussed - - - Falls evaluation completed;Education provided;Falls prevention discussed    Preventative: COLONOSCOPY 07/2014; WNL Amedeo Plenty)  Breast cancer screening - birads1 mammogram 10/2018  Well woman exam - s/p hysterectomy, ovaries remain. No pelvic pain, vaginal bleeding. Pelvic exam normal 2017.  Lung cancer screening - never smoker  DEXA T -1.1 hip, -0.2 spine 08/2015  Flu shot - declines  Tetanus shot - will check with insurance  Pneumovax 2014, prevnar 2019, pneumovax final today  Shingrix - declines  Advanced directive discussion - brought forms 2017. Son Torrey Pons is HCPOA then [place]. Clear she does not want CPR, no intubation.Has DNR form at home.Jehovah's witness - no blood products.  Seat belt use discussed Sunscreen use and skin screen discussed Non smoker  Alcohol - none  Dentist yearly - due  Eye exam yearly  Bowel - no constipation Bladder - no incontinence  Lives with son, 1 dog Occupation: unemployed, on disability for fibromyalgia since 2008. Edu: HS Religion: Jehova's witness Activity: walks some Diet: good water, fruits/vegetables some  No caffeine use     Relevant past medical, surgical, family and social history reviewed and updated as indicated. Interim medical history since our last visit reviewed. Allergies and medications reviewed and updated. Outpatient Medications Prior to Visit  Medication Sig Dispense Refill  . ALPRAZolam (XANAX) 0.5 MG tablet Take 1 tablet (0.5 mg total) by mouth 2 (two) times daily. 180 tablet 2  . amitriptyline (ELAVIL) 25 MG tablet Take 1 tablet (25 mg total) by mouth at bedtime. 90 tablet 2  . antiseptic oral rinse (BIOTENE) LIQD 15 mLs by Mouth Rinse route at bedtime.     Marland Kitchen aspirin 81 MG tablet Take 1 tablet (81 mg total) by mouth daily.    Marland Kitchen  cholecalciferol (VITAMIN D) 1000 UNITS tablet Take 1,000 Units by mouth daily.     Marland Kitchen Co-Enzyme Q-10 100 MG CAPS Take 1 capsule (100 mg total) by mouth daily.  0  . diclofenac sodium (VOLTAREN) 1 % GEL Apply 1 application topically 2 (two) times daily as needed (pain).    Marland Kitchen diltiazem (CARDIZEM CD) 360 MG 24 hr capsule Take 1 capsule (360 mg total) by mouth daily. 90 capsule 3  . escitalopram (LEXAPRO) 20 MG tablet Take 1 tablet (20 mg total) by mouth 2 (two) times daily. 180 tablet 2  . ezetimibe (ZETIA) 10 MG tablet TAKE 1 TABLET BY MOUTH AT  BEDTIME 90 tablet 0  . famotidine (PEPCID) 20 MG tablet TAKE 1 TABLET BY MOUTH TWO  TIMES DAILY 180 tablet 2  . furosemide  (LASIX) 20 MG tablet TAKE 1 TABLET BY MOUTH  DAILY AS NEEDED FOR EDEMA 90 tablet 1  . gabapentin (NEURONTIN) 300 MG capsule TAKE 2 CAPSULES BY MOUTH  EVERY MORNING, 1 CAPSULE  EVERY AFTERNOON AND 2  CAPSULES AT BEDTIME. 450 capsule 1  . hydroxychloroquine (PLAQUENIL) 200 MG tablet TAKE 1 TABLET BY MOUTH  TWICE DAILY 180 tablet 0  . lovastatin (MEVACOR) 10 MG tablet TAKE 1 TABLET BY MOUTH  EVERY MONDAY, WEDNESDAY,  AND FRIDAY 40 tablet 3  . Polyethyl Glycol-Propyl Glycol (SYSTANE) 0.4-0.3 % GEL Place 1 drop into both eyes 2 (two) times daily.     . RESTASIS 0.05 % ophthalmic emulsion INSTILL ONE DROP IN BOTH  EYES TWO TIMES DAILY 180 each 0  . traMADol (ULTRAM) 50 MG tablet Take 1 tablet (50 mg total) by mouth 3 (three) times daily. 60 tablet 0  . metoprolol tartrate (LOPRESSOR) 50 MG tablet Take 1 tablet (50 mg total) by mouth 2 (two) times daily. 180 tablet 1   No facility-administered medications prior to visit.      Per HPI unless specifically indicated in ROS section below Review of Systems  Constitutional: Positive for appetite change (decreased). Negative for activity change, chills, fatigue, fever and unexpected weight change.  HENT: Negative for hearing loss.   Eyes: Negative for visual disturbance.  Respiratory: Negative for cough, chest tightness, shortness of breath and wheezing.   Cardiovascular: Negative for chest pain, palpitations and leg swelling.  Gastrointestinal: Negative for abdominal distention, abdominal pain, blood in stool, constipation, diarrhea, nausea and vomiting.  Genitourinary: Negative for difficulty urinating and hematuria.  Musculoskeletal: Negative for arthralgias, myalgias and neck pain.  Skin: Negative for rash.  Neurological: Negative for dizziness, seizures, syncope and headaches.  Hematological: Negative for adenopathy. Does not bruise/bleed easily.  Psychiatric/Behavioral: Negative for dysphoric mood. The patient is not nervous/anxious.    Objective:     BP (!) 144/82 (BP Location: Right Arm, Patient Position: Sitting, Cuff Size: Large)   Pulse 73   Temp 98.4 F (36.9 C) (Temporal)   Ht 5' 1.5" (1.562 m)   Wt 188 lb 9 oz (85.5 kg)   SpO2 95%   BMI 35.05 kg/m   Wt Readings from Last 3 Encounters:  12/22/18 188 lb 9 oz (85.5 kg)  10/28/18 193 lb (87.5 kg)  10/27/18 194 lb 6.4 oz (88.2 kg)    Physical Exam Vitals signs and nursing note reviewed.  Constitutional:      General: She is not in acute distress.    Appearance: Normal appearance. She is well-developed. She is not ill-appearing.  HENT:     Head: Normocephalic and atraumatic.     Right  Ear: Hearing, tympanic membrane, ear canal and external ear normal.     Left Ear: Hearing, tympanic membrane, ear canal and external ear normal.     Nose: Nose normal.     Mouth/Throat:     Mouth: Mucous membranes are moist.     Pharynx: Oropharynx is clear. Uvula midline. No posterior oropharyngeal erythema.  Eyes:     General: No scleral icterus.    Conjunctiva/sclera: Conjunctivae normal.     Pupils: Pupils are equal, round, and reactive to light.  Neck:     Musculoskeletal: Normal range of motion and neck supple.     Vascular: No carotid bruit.  Cardiovascular:     Rate and Rhythm: Normal rate and regular rhythm.     Pulses: Normal pulses.          Radial pulses are 2+ on the right side and 2+ on the left side.     Heart sounds: Murmur (2/6 systolic) present.  Pulmonary:     Effort: Pulmonary effort is normal. No respiratory distress.     Breath sounds: Normal breath sounds. No wheezing, rhonchi or rales.  Abdominal:     General: Abdomen is flat. Bowel sounds are normal. There is no distension.     Palpations: Abdomen is soft. There is no mass.     Tenderness: There is no abdominal tenderness. There is no guarding or rebound.     Hernia: No hernia is present.  Musculoskeletal: Normal range of motion.     Right lower leg: No edema.     Left lower leg: No edema.   Lymphadenopathy:     Cervical: No cervical adenopathy.  Skin:    General: Skin is warm and dry.     Findings: No rash.  Neurological:     General: No focal deficit present.     Mental Status: She is alert and oriented to person, place, and time.     Comments: CN grossly intact, station and gait intact  Psychiatric:        Mood and Affect: Mood normal.        Behavior: Behavior normal.        Thought Content: Thought content normal.        Judgment: Judgment normal.       Results for orders placed or performed in visit on 12/15/18  VITAMIN D 25 Hydroxy (Vit-D Deficiency, Fractures)  Result Value Ref Range   VITD 41.56 30.00 - 100.00 ng/mL  Microalbumin / creatinine urine ratio  Result Value Ref Range   Microalb, Ur <0.7 0.0 - 1.9 mg/dL   Creatinine,U 58.0 mg/dL   Microalb Creat Ratio 1.2 0.0 - 30.0 mg/g  Renal function panel  Result Value Ref Range   Sodium 137 135 - 145 mEq/L   Potassium 4.1 3.5 - 5.1 mEq/L   Chloride 105 96 - 112 mEq/L   CO2 24 19 - 32 mEq/L   Calcium 9.5 8.4 - 10.5 mg/dL   Albumin 4.2 3.5 - 5.2 g/dL   BUN 7 6 - 23 mg/dL   Creatinine, Ser 1.20 0.40 - 1.20 mg/dL   Glucose, Bld 84 70 - 99 mg/dL   Phosphorus 3.3 2.3 - 4.6 mg/dL   GFR 54.32 (L) >60.00 mL/min  Lipid panel  Result Value Ref Range   Cholesterol 175 0 - 200 mg/dL   Triglycerides 70.0 0.0 - 149.0 mg/dL   HDL 80.40 >39.00 mg/dL   VLDL 14.0 0.0 - 40.0 mg/dL   LDL Cholesterol 80 0 -  99 mg/dL   Total CHOL/HDL Ratio 2    NonHDL 94.10    Depression screen [facility] 2/9 12/22/2018 12/21/2018 12/11/2017 12/09/2016 11/30/2015  Decreased Interest 0 0 0 1 3  Down, Depressed, Hopeless 0 1 0 1 1  PHQ - 2 Score 0 1 0 2 4  Altered sleeping 0 0 0 0 3  Tired, decreased energy 1 0 0 1 3  Change in appetite 1 0 0 0 1  Feeling bad or failure about yourself  0 0 0 0 0  Trouble concentrating 1 0 0 1 1  Moving slowly or fidgety/restless 1 0 0 0 1  Suicidal thoughts 0 0 0 0 0  PHQ-9 Score 4 1 0 4 13  Difficult  doing work/chores - Not difficult at all Not difficult at all Somewhat difficult Somewhat difficult  Some recent data might be hidden   Assessment & Plan:   Problem List Items Addressed This Visit    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HCC)    Continue statin, aspirin.       Sjogren's syndrome ([place])    Continue rheum f/u.       Refusal of blood transfusions as patient is Jehovah's Witness   Osteopenia   Osteoarthritis   MDD (major depressive disorder), recurrent episode ([place])    Appreciate psych care - continue current regimen.       HYPERCHOLESTEROLEMIA    Continue daily zetia, lovastatin MWF. Overall improved lipid levels from last year.  The 10-year ASCVD risk score Mikey Bussing DC Brooke Bonito., et al., 2013) is: 23.3%   Values used to calculate the score:     Age: 89 years     Sex: Female     Is Non-Hispanic African American: Yes     Diabetic: Yes     Tobacco smoker: No     Systolic Blood Pressure: 123456 mmHg     Is BP treated: Yes     HDL Cholesterol: 80.4 mg/dL     Total Cholesterol: 175 mg/dL       Health maintenance examination - Primary    Preventative protocols reviewed and updated unless pt declined. Discussed healthy diet and lifestyle.       Fibromyalgia   Essential hypertension    Chronic, elevated today - attributed to grieving recent deaths in the family. No med changes today.       Diastolic CHF ([place])    H/o this. Seems euvolemic.       CKD (chronic kidney disease) stage 3, GFR 30-59 ml/min    Stable period, some improvement noted since last check.       Aortic stenosis    Again heard today.       Advanced care planning/counseling discussion    Advanced directive discussion - brought forms 2017. Son Malaysha Tkaczyk is HCPOA then [place]. Clear she does not want CPR, no intubation.Has DNR form at home.Jehovah's witness - no blood products.       Acute right flank pain    Ongoing, intermittent. Presumed due to FM.        Other Visit Diagnoses    Need for  23-polyvalent pneumococcal polysaccharide vaccine       Relevant Orders   Pneumococcal polysaccharide vaccine 23-valent greater than or equal to 2yo subcutaneous/IM (Completed)       No orders of the defined types were placed in this encounter.  Orders Placed This Encounter  Procedures  . Pneumococcal polysaccharide vaccine 23-valent greater than or equal to 2yo subcutaneous/IM  Patient instructions: Pneumovax today (final pneumonia shot).  Labs were looking good. You are doing well today Continue current medicines.  Return in 6 months for follow up visit.  Follow up plan: Return in about 6 months (around 06/22/2019) for follow up visit.  Ria Bush, MD

## 2018-12-22 NOTE — Assessment & Plan Note (Addendum)
Chronic, elevated today - attributed to grieving recent deaths in the family. No med changes today.

## 2018-12-22 NOTE — Assessment & Plan Note (Signed)
Continue rheum f/u. 

## 2018-12-22 NOTE — Assessment & Plan Note (Signed)
Appreciate psych care - continue current regimen.

## 2018-12-22 NOTE — Assessment & Plan Note (Addendum)
Ongoing, intermittent. Presumed due to FM.

## 2018-12-22 NOTE — Assessment & Plan Note (Signed)
Again heard today.  

## 2018-12-22 NOTE — Assessment & Plan Note (Signed)
Continue statin, aspirin 

## 2018-12-22 NOTE — Assessment & Plan Note (Signed)
Continue daily zetia, lovastatin MWF. Overall improved lipid levels from last year.  The 10-year ASCVD risk score Mikey Bussing DC Brooke Bonito., et al., 2013) is: 23.3%   Values used to calculate the score:     Age: 68 years     Sex: Female     Is Non-Hispanic African American: Yes     Diabetic: Yes     Tobacco smoker: No     Systolic Blood Pressure: 123456 mmHg     Is BP treated: Yes     HDL Cholesterol: 80.4 mg/dL     Total Cholesterol: 175 mg/dL

## 2018-12-22 NOTE — Assessment & Plan Note (Signed)
H/o this. Seems euvolemic.

## 2019-01-20 ENCOUNTER — Other Ambulatory Visit: Payer: Self-pay | Admitting: Rheumatology

## 2019-01-20 ENCOUNTER — Other Ambulatory Visit: Payer: Self-pay | Admitting: Family Medicine

## 2019-01-20 ENCOUNTER — Other Ambulatory Visit: Payer: Self-pay | Admitting: Physician Assistant

## 2019-01-20 DIAGNOSIS — M3501 Sicca syndrome with keratoconjunctivitis: Secondary | ICD-10-CM

## 2019-01-20 NOTE — Telephone Encounter (Addendum)
Last Visit: 10/27/18 Next Visit: 04/27/19 UDS: 06/25/18 (update 01/21/19 pending) Narc Agreement:01/21/19  Patient updated UDS and narcotic agreement today.   Okay to refill Tramadol?

## 2019-01-20 NOTE — Telephone Encounter (Addendum)
Last Visit: 10/27/18 Next Visit: 04/27/19 Labs: CBC was drawn on 10/06/2018.  White blood cell count was low at 3.8.  BMP was drawn on 08/07/2018 and creatinine was 1.36 with a GFR of 47 PLQ Eye Exam: 08/03/2018 WNL   Okay to refill per Dr. Estanislado Pandy

## 2019-01-21 ENCOUNTER — Other Ambulatory Visit: Payer: Self-pay

## 2019-01-21 ENCOUNTER — Other Ambulatory Visit: Payer: Self-pay | Admitting: *Deleted

## 2019-01-21 DIAGNOSIS — G8929 Other chronic pain: Secondary | ICD-10-CM | POA: Diagnosis not present

## 2019-01-21 DIAGNOSIS — M3501 Sicca syndrome with keratoconjunctivitis: Secondary | ICD-10-CM

## 2019-01-21 DIAGNOSIS — Z5181 Encounter for therapeutic drug level monitoring: Secondary | ICD-10-CM

## 2019-01-21 DIAGNOSIS — Z79899 Other long term (current) drug therapy: Secondary | ICD-10-CM

## 2019-01-22 ENCOUNTER — Other Ambulatory Visit: Payer: Self-pay | Admitting: Family Medicine

## 2019-01-22 NOTE — Progress Notes (Signed)
Labs are stable.

## 2019-01-23 LAB — CBC WITH DIFFERENTIAL/PLATELET
Absolute Monocytes: 519 cells/uL (ref 200–950)
Basophils Absolute: 40 cells/uL (ref 0–200)
Basophils Relative: 0.9 %
Eosinophils Absolute: 92 cells/uL (ref 15–500)
Eosinophils Relative: 2.1 %
HCT: 38.2 % (ref 35.0–45.0)
Hemoglobin: 11.9 g/dL (ref 11.7–15.5)
Lymphs Abs: 1492 cells/uL (ref 850–3900)
MCH: 26.5 pg — ABNORMAL LOW (ref 27.0–33.0)
MCHC: 31.2 g/dL — ABNORMAL LOW (ref 32.0–36.0)
MCV: 85.1 fL (ref 80.0–100.0)
MPV: 10.9 fL (ref 7.5–12.5)
Monocytes Relative: 11.8 %
Neutro Abs: 2257 cells/uL (ref 1500–7800)
Neutrophils Relative %: 51.3 %
Platelets: 228 10*3/uL (ref 140–400)
RBC: 4.49 10*6/uL (ref 3.80–5.10)
RDW: 13.8 % (ref 11.0–15.0)
Total Lymphocyte: 33.9 %
WBC: 4.4 10*3/uL (ref 3.8–10.8)

## 2019-01-23 LAB — PAIN MGMT, PROFILE 5 W/CONF, U
Alphahydroxyalprazolam: 864 ng/mL
Alphahydroxymidazolam: NEGATIVE ng/mL
Alphahydroxytriazolam: NEGATIVE ng/mL
Aminoclonazepam: NEGATIVE ng/mL
Amphetamines: NEGATIVE ng/mL
Barbiturates: NEGATIVE ng/mL
Benzodiazepines: POSITIVE ng/mL
Cocaine Metabolite: NEGATIVE ng/mL
Creatinine: 104.1 mg/dL
Hydroxyethylflurazepam: NEGATIVE ng/mL
Lorazepam: NEGATIVE ng/mL
Marijuana Metabolite: NEGATIVE ng/mL
Methadone Metabolite: NEGATIVE ng/mL
Nordiazepam: NEGATIVE ng/mL
Opiates: NEGATIVE ng/mL
Oxazepam: NEGATIVE ng/mL
Oxidant: NEGATIVE ug/mL
Oxycodone: NEGATIVE ng/mL
Temazepam: NEGATIVE ng/mL
pH: 6.1 (ref 4.5–9.0)

## 2019-01-23 LAB — COMPLETE METABOLIC PANEL WITH GFR
AG Ratio: 1.4 (calc) (ref 1.0–2.5)
ALT: 14 U/L (ref 6–29)
AST: 22 U/L (ref 10–35)
Albumin: 4.2 g/dL (ref 3.6–5.1)
Alkaline phosphatase (APISO): 65 U/L (ref 37–153)
BUN/Creatinine Ratio: 7 (calc) (ref 6–22)
BUN: 9 mg/dL (ref 7–25)
CO2: 26 mmol/L (ref 20–32)
Calcium: 9.8 mg/dL (ref 8.6–10.4)
Chloride: 108 mmol/L (ref 98–110)
Creat: 1.27 mg/dL — ABNORMAL HIGH (ref 0.50–0.99)
GFR, Est African American: 51 mL/min/{1.73_m2} — ABNORMAL LOW (ref >=60)
GFR, Est Non African American: 44 mL/min/{1.73_m2} — ABNORMAL LOW (ref >=60)
Globulin: 3 g/dL (calc) (ref 1.9–3.7)
Glucose, Bld: 88 mg/dL (ref 65–99)
Potassium: 4.3 mmol/L (ref 3.5–5.3)
Sodium: 142 mmol/L (ref 135–146)
Total Bilirubin: 0.5 mg/dL (ref 0.2–1.2)
Total Protein: 7.2 g/dL (ref 6.1–8.1)

## 2019-01-23 LAB — URINALYSIS, ROUTINE W REFLEX MICROSCOPIC
Bacteria, UA: NONE SEEN /HPF
Bilirubin Urine: NEGATIVE
Glucose, UA: NEGATIVE
Hgb urine dipstick: NEGATIVE
Hyaline Cast: NONE SEEN /LPF
Ketones, ur: NEGATIVE
Nitrite: NEGATIVE
Protein, ur: NEGATIVE
RBC / HPF: NONE SEEN /HPF (ref 0–2)
Specific Gravity, Urine: 1.009 (ref 1.001–1.03)
Squamous Epithelial / HPF: NONE SEEN /HPF (ref <=5)
WBC, UA: NONE SEEN /HPF (ref 0–5)
pH: 5.5 (ref 5.0–8.0)

## 2019-01-23 LAB — PAIN MGMT, TRAMADOL W/MEDMATCH, U
Desmethyltramadol: NEGATIVE ng/mL
Tramadol: 117 ng/mL

## 2019-01-24 NOTE — Progress Notes (Signed)
C/w

## 2019-01-25 ENCOUNTER — Encounter: Payer: Self-pay | Admitting: Family Medicine

## 2019-01-25 ENCOUNTER — Ambulatory Visit (INDEPENDENT_AMBULATORY_CARE_PROVIDER_SITE_OTHER): Payer: Medicare Other | Admitting: Family Medicine

## 2019-01-25 DIAGNOSIS — J019 Acute sinusitis, unspecified: Secondary | ICD-10-CM | POA: Insufficient documentation

## 2019-01-25 DIAGNOSIS — J0110 Acute frontal sinusitis, unspecified: Secondary | ICD-10-CM | POA: Diagnosis not present

## 2019-01-25 MED ORDER — AMOXICILLIN-POT CLAVULANATE 875-125 MG PO TABS: 1.0000 | ORAL_TABLET | Freq: Two times a day (BID) | ORAL | 0 refills | Status: AC

## 2019-01-25 NOTE — Assessment & Plan Note (Signed)
Story consistent with acute sinusitis, not consistent with COVID19 infection. Treat with nasal saline irrigation and plain mucinex. WASP for augmentin 7d course sent to pharmacy with indication when to fill. Pt agrees with plan.

## 2019-01-25 NOTE — Progress Notes (Signed)
Virtual visit completed through [employer]. Due to national recommendations of social distancing due to COVID-19, a virtual visit is felt to be most appropriate for this patient at this time. Reviewed limitations of a virtual visit.   Patient location: home Provider location: [place] at [facility], office If any vitals were documented, they were collected by patient at home unless specified below.    BP (!) 178/98   Pulse 72   Temp 98.2 F (36.8 C)   Ht 5\' 1"  (1.549 m)   Wt 188 lb (85.3 kg)   BMI 35.52 kg/m    CC: nasal congestion Subjective:    Patient ID: Alexandria Leach, female    DOB: 1950/10/25, 68 y.o.   MRN: [id number]  HPI: Alexandria Leach is a 68 y.o. female presenting on 01/25/2019 for Nasal Congestion (C/o sinus congestion and sinus pressure, worsening.  Denies any fever, cough, ST or other sxs. Sxs started 01/22/19.  Tried Vick's Vapor Rub. )   Known CHF, CKD, fibromyalgia and sjogren's syndrome.   About 1 wk h/o chills, then 4 d h/o head congestion and frontal sinus pressure pain.   No fevers/chills, cough, dyspnea, ST, PNdrainage, ear or tooth pain, abd pain, nausea, diarrhea. No rhinorrhea. No chest congestion. No loss of taste or smell.   No sick contacts at home, no Covid19 exposure.  Non smoker.  No h/o asthma or COPD. She does have h/o bronchitis in the past.   Tried vicks vaoprub with temporary relief.  Unable to tolerate nasal steroid as it causes sores in nose.       Relevant past medical, surgical, family and social history reviewed and updated as indicated. Interim medical history since our last visit reviewed. Allergies and medications reviewed and updated. Outpatient Medications Prior to Visit  Medication Sig Dispense Refill  . ALPRAZolam (XANAX) 0.5 MG tablet Take 1 tablet (0.5 mg total) by mouth 2 (two) times daily. 180 tablet 2  . amitriptyline (ELAVIL) 25 MG tablet Take 1 tablet (25 mg total) by mouth at bedtime. 90 tablet 2  .  antiseptic oral rinse (BIOTENE) LIQD 15 mLs by Mouth Rinse route at bedtime.     Marland Kitchen aspirin 81 MG tablet Take 1 tablet (81 mg total) by mouth daily.    . cholecalciferol (VITAMIN D) 1000 UNITS tablet Take 1,000 Units by mouth daily.     Marland Kitchen Co-Enzyme Q-10 100 MG CAPS Take 1 capsule (100 mg total) by mouth daily.  0  . diclofenac sodium (VOLTAREN) 1 % GEL Apply 1 application topically 2 (two) times daily as needed (pain).    Marland Kitchen diltiazem (CARDIZEM CD) 360 MG 24 hr capsule Take 1 capsule (360 mg total) by mouth daily. 90 capsule 3  . escitalopram (LEXAPRO) 20 MG tablet Take 1 tablet (20 mg total) by mouth 2 (two) times daily. 180 tablet 2  . ezetimibe (ZETIA) 10 MG tablet TAKE 1 TABLET BY MOUTH AT  BEDTIME 90 tablet 0  . famotidine (PEPCID) 20 MG tablet TAKE 1 TABLET BY MOUTH TWO  TIMES DAILY 180 tablet 3  . furosemide (LASIX) 20 MG tablet TAKE 1 TABLET BY MOUTH  DAILY AS NEEDED FOR EDEMA 90 tablet 1  . gabapentin (NEURONTIN) 300 MG capsule TAKE 2 CAPSULES BY MOUTH  EVERY MORNING, 1 CAPSULE  EVERY AFTERNOON AND 2  CAPSULES AT BEDTIME. 450 capsule 1  . hydroxychloroquine (PLAQUENIL) 200 MG tablet TAKE 1 TABLET BY MOUTH  TWICE DAILY 180 tablet 0  . lovastatin (MEVACOR) 10  MG tablet TAKE 1 TABLET BY MOUTH  EVERY MONDAY, WEDNESDAY,  AND FRIDAY 40 tablet 3  . metoprolol tartrate (LOPRESSOR) 50 MG tablet TAKE 1 TABLET BY MOUTH  TWICE DAILY 180 tablet 3  . Polyethyl Glycol-Propyl Glycol (SYSTANE) 0.4-0.3 % GEL Place 1 drop into both eyes 2 (two) times daily.     . RESTASIS 0.05 % ophthalmic emulsion INSTILL ONE DROP IN BOTH  EYES TWO TIMES DAILY 180 each 0  . traMADol (ULTRAM) 50 MG tablet TAKE 1 TABLET BY MOUTH 3  TIMES DAILY 60 tablet 0   No facility-administered medications prior to visit.      Per HPI unless specifically indicated in ROS section below Review of Systems Objective:    BP (!) 178/98   Pulse 72   Temp 98.2 F (36.8 C)   Ht 5\' 1"  (1.549 m)   Wt 188 lb (85.3 kg)   BMI 35.52 kg/m    Wt Readings from Last 3 Encounters:  01/25/19 188 lb (85.3 kg)  12/22/18 188 lb 9 oz (85.5 kg)  10/28/18 193 lb (87.5 kg)     Physical exam: Gen: alert, NAD, not ill appearing Pulm: speaks in complete sentences without increased work of breathing Psych: normal mood, normal thought content      Assessment & Plan:   Problem List Items Addressed This Visit    Acute sinusitis    Story consistent with acute sinusitis, not consistent with COVID19 infection. Treat with nasal saline irrigation and plain mucinex. WASP for augmentin 7d course sent to pharmacy with indication when to fill. Pt agrees with plan.       Relevant Medications   amoxicillin-clavulanate (AUGMENTIN) 875-125 MG tablet       Meds ordered this encounter  Medications  . amoxicillin-clavulanate (AUGMENTIN) 875-125 MG tablet    Sig: Take 1 tablet by mouth 2 (two) times daily for 7 days.    Dispense:  14 tablet    Refill:  0   No orders of the defined types were placed in this encounter.   I discussed the assessment and treatment plan with the patient. The patient was provided an opportunity to ask questions and all were answered. The patient agreed with the plan and demonstrated an understanding of the instructions. The patient was advised to call back or seek an in-person evaluation if the symptoms worsen or if the condition fails to improve as anticipated.  Follow up plan: No follow-ups on file.  Ria Bush, MD

## 2019-02-16 MED ORDER — DILTIAZEM HCL ER COATED BEADS 360 MG PO CP24: 360.0000 mg | ORAL_CAPSULE | Freq: Every day | ORAL | 3 refills | Status: DC

## 2019-02-24 ENCOUNTER — Encounter (HOSPITAL_COMMUNITY): Payer: Self-pay | Admitting: Psychiatry

## 2019-02-24 ENCOUNTER — Other Ambulatory Visit: Payer: Self-pay

## 2019-02-24 ENCOUNTER — Ambulatory Visit (INDEPENDENT_AMBULATORY_CARE_PROVIDER_SITE_OTHER): Payer: Medicare Other | Admitting: Psychiatry

## 2019-02-24 DIAGNOSIS — F331 Major depressive disorder, recurrent, moderate: Secondary | ICD-10-CM

## 2019-02-24 MED ORDER — ESCITALOPRAM OXALATE 20 MG PO TABS: 20.0000 mg | ORAL_TABLET | Freq: Two times a day (BID) | ORAL | 2 refills | Status: DC

## 2019-02-24 MED ORDER — ALPRAZOLAM 0.5 MG PO TABS: 0.5000 mg | ORAL_TABLET | Freq: Two times a day (BID) | ORAL | 2 refills | Status: DC

## 2019-02-24 MED ORDER — AMITRIPTYLINE HCL 25 MG PO TABS: 25.0000 mg | ORAL_TABLET | Freq: Every day | ORAL | 2 refills | Status: DC

## 2019-02-24 NOTE — Progress Notes (Signed)
Virtual Visit via Telephone Note  I connected with Alexandria Leach on 02/24/19 at 10:00 AM EST by telephone and verified that I am speaking with the correct person using two identifiers.   I discussed the limitations, risks, security and privacy concerns of performing an evaluation and management service by telephone and the availability of in person appointments. I also discussed with the patient that there may be a patient responsible charge related to this service. The patient expressed understanding and agreed to proceed.    I discussed the assessment and treatment plan with the patient. The patient was provided an opportunity to ask questions and all were answered. The patient agreed with the plan and demonstrated an understanding of the instructions.   The patient was advised to call back or seek an in-person evaluation if the symptoms worsen or if the condition fails to improve as anticipated.  I provided 15 minutes of non-face-to-face time during this encounter.   [name], MD  [facility] MD/PA/NP OP Progress Note  02/24/2019 10:21 AM Alexandria Leach  MRN:  [id number]  Chief Complaint:  Chief Complaint    Depression; Anxiety; Follow-up     HPI: This patient is a 68 year old separated black female who lives with her son in [place].  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  She has 4 sons and 3 grandchildren.   The patient returns for follow-up after 3 months.  She states that she has had a bit of a hard time recently both her brother and her sister died on the same day in early 2023-03-05.  It sounds as if her brother died of a sudden heart attack and her sister died in a nursing home.  She cannot get much information about her sister's death but she is suspicious that it was COVID-19.  Her sister had early dementia due to alcoholism and was only 66 years old.  They have been very close growing up and  this is been particularly hard for the patient.  She is talking a lot to her other sister and other family members as well as her son.  Unfortunately one of her daughter-in-law's is dying of metastatic breast cancer and this is also been difficult.  Nevertheless the patient states that her mood has been stable on her medication.  She is sleeping well and the Xanax continues to help her anxiety.  She is still having chronic hip pain. Visit Diagnosis:    ICD-10-CM   1. Major depressive disorder, recurrent episode, moderate (HCC)  F33.1     Past Psychiatric History: none  Past Medical History:  Past Medical History:  Diagnosis Date  . Ankle fracture, left 02/19/2015   from a fall  . Anxiety   . Cataract 2014   corrected with surgery  . CHF (congestive heart failure) ([place])   . CKD (chronic kidney disease) stage 3, GFR 30-59 ml/min    saw nephrologist Dr Harden Mo  . DDD (degenerative disc disease), cervical   . Depression   . Fibromyalgia   . Glaucoma    s/p surgery, sees ophtho Q6 mo  . 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 History of kidney stones 2010  . History of pulmonary embolism 2001, 2006   completed coumadin courses  . History of rheumatic fever x3  . HLD (hyperlipidemia)   . HTN (hypertension)   . Insomnia   .  Lung nodule 09/22/2012   RLL - 62mm, stable since 2014. Thought benign.   . Osteoarthritis    shoulders and knees, not RA per Dr Estanislado Pandy, positive ANA, positive Ro  . Osteopenia 09/18/2015   DEXA T -1.1 hip, -0.2 spine 08/2015   . Personal history of urinary calculi latest 2014  . Pneumonia 12/02/2011  . PONV (postoperative nausea and vomiting)   . Refusal of blood transfusions as patient is Jehovah's Witness   . Rheumatic heart disease 1980   s/p mitral valve repair 1980  . Sjogren's syndrome ([place])   . Trimalleolar fracture of left ankle 02/23/2015    Past Surgical History:  Procedure  Laterality Date  . BREAST BIOPSY Right 2006   benign  . CHOLECYSTECTOMY  11/27/2011   Procedure: LAPAROSCOPIC CHOLECYSTECTOMY WITH INTRAOPERATIVE CHOLANGIOGRAM;  Surgeon: Adin Hector, MD;  Location: [facility];  Service: General;  Laterality: N/A;  laparoscopic cholecystectomy with choleangiogram umbilical hernia repair  . COLONOSCOPY  07/2014   WNL Amedeo Plenty)  . dexa  08/2012   normal per patient - no records available  . EYE SURGERY Bilateral 2014   cataract removal  . MITRAL VALVE REPAIR  1980   open heart  . ORIF ANKLE FRACTURE Left 02/26/2015   Procedure: OPEN REDUCTION INTERNAL FIXATION (ORIF) LEFT TRIMALLEOLAR ANKLE FRACTURE;  Surgeon: [name], MD;  Location: [facility];  Service: Orthopedics;  Laterality: Left;  . TUBAL LIGATION  1980  . UMBILICAL HERNIA REPAIR  11/27/2011   Procedure: HERNIA REPAIR UMBILICAL ADULT;  Surgeon: Adin Hector, MD;  Location: [facility];  Service: General;  Laterality: N/A;  . [place]   for fibroids -- partial, ovaries remain    Family Psychiatric History: see below  Family History:  Family History  Problem Relation Age of Onset  . Lupus Sister        and niece  . Cancer Mother        lung (nonsmoker)  . CAD Mother        MI in her 71s  . ALS Mother   . Kidney disease Father   . Alcohol abuse Father   . Diabetes Father   . Cancer Brother        bone  . Diabetes Sister   . Stroke Sister   . Cancer Maternal Uncle        bone  . Depression Sister   . Kidney failure Other        on HD  . Diabetes Brother   . Heart attack Brother   . Stroke Maternal Grandmother     Social History:  Social History   Socioeconomic History  . Marital status: Married    Spouse name: [name]  . Number of children: 4  . Years of education: 60  . Highest education level: Not on file  Occupational History    Employer: UNEMPLOYED    Comment: Disability  Social Needs  . Financial resource strain: Not hard at all  . Food insecurity     Worry: Never true    Inability: Never true  . Transportation needs    Medical: No    Non-medical: No  Tobacco Use  . Smoking status: Never Smoker  . Smokeless tobacco: Never Used  Substance and Sexual Activity  . Alcohol use: No    Alcohol/week: 0.0 standard drinks  . Drug use: No  . Sexual activity: Not Currently    Birth control/protection: Surgical  Lifestyle  . Physical activity  Days per week: 0 days    Minutes per session: 0 min  . Stress: To some extent  Relationships  . Social Herbalist on phone: Not on file    Gets together: Not on file    Attends religious service: Not on file    Active member of club or organization: Not on file    Attends meetings of clubs or organizations: Not on file    Relationship status: Not on file  Other Topics Concern  . Not on file  Social History Narrative   Lives with son, 1 dog   Occupation: unemployed, on disability for fibromyalgia since 2008.   Edu: HS   Religion: Jehova's witness   Activity: volunteers at senior center   Diet: some water, fruits/vegetables daily   No caffeine use    Allergies:  Allergies  Allergen Reactions  . Cymbalta [Duloxetine Hcl] Other (See Comments)    tachycardia  . Statins Nausea Only and Other (See Comments)    Muscle cramps also  . Sulfa Antibiotics Nausea And Vomiting    Metabolic Disorder Labs: Lab Results  Component Value Date   HGBA1C 5.6 10/13/2015   MPG 114 10/13/2015   No results found for: PROLACTIN Lab Results  Component Value Date   CHOL 175 12/15/2018   TRIG 70.0 12/15/2018   HDL 80.40 12/15/2018   CHOLHDL 2 12/15/2018   VLDL 14.0 12/15/2018   LDLCALC 80 12/15/2018   LDLCALC 158 (H) 12/11/2017   Lab Results  Component Value Date   TSH 1.91 10/06/2018   TSH 3.55 04/03/2018    Therapeutic Level Labs: No results found for: LITHIUM No results found for: VALPROATE No components found for:  CBMZ  Current Medications: Current Outpatient Medications   Medication Sig Dispense Refill  . ALPRAZolam (XANAX) 0.5 MG tablet Take 1 tablet (0.5 mg total) by mouth 2 (two) times daily. 180 tablet 2  . amitriptyline (ELAVIL) 25 MG tablet Take 1 tablet (25 mg total) by mouth at bedtime. 90 tablet 2  . antiseptic oral rinse (BIOTENE) LIQD 15 mLs by Mouth Rinse route at bedtime.     Marland Kitchen aspirin 81 MG tablet Take 1 tablet (81 mg total) by mouth daily.    . cholecalciferol (VITAMIN D) 1000 UNITS tablet Take 1,000 Units by mouth daily.     Marland Kitchen Co-Enzyme Q-10 100 MG CAPS Take 1 capsule (100 mg total) by mouth daily.  0  . diclofenac sodium (VOLTAREN) 1 % GEL Apply 1 application topically 2 (two) times daily as needed (pain).    Marland Kitchen diltiazem (CARDIZEM CD) 360 MG 24 hr capsule Take 1 capsule (360 mg total) by mouth daily. 90 capsule 3  . escitalopram (LEXAPRO) 20 MG tablet Take 1 tablet (20 mg total) by mouth 2 (two) times daily. 180 tablet 2  . ezetimibe (ZETIA) 10 MG tablet TAKE 1 TABLET BY MOUTH AT  BEDTIME 90 tablet 0  . famotidine (PEPCID) 20 MG tablet TAKE 1 TABLET BY MOUTH TWO  TIMES DAILY 180 tablet 3  . furosemide (LASIX) 20 MG tablet TAKE 1 TABLET BY MOUTH  DAILY AS NEEDED FOR EDEMA 90 tablet 1  . gabapentin (NEURONTIN) 300 MG capsule TAKE 2 CAPSULES BY MOUTH  EVERY MORNING, 1 CAPSULE  EVERY AFTERNOON AND 2  CAPSULES AT BEDTIME. 450 capsule 1  . hydroxychloroquine (PLAQUENIL) 200 MG tablet TAKE 1 TABLET BY MOUTH  TWICE DAILY 180 tablet 0  . lovastatin (MEVACOR) 10 MG tablet TAKE 1 TABLET BY  MOUTH  EVERY MONDAY, WEDNESDAY,  AND FRIDAY 40 tablet 3  . metoprolol tartrate (LOPRESSOR) 50 MG tablet TAKE 1 TABLET BY MOUTH  TWICE DAILY 180 tablet 3  . Polyethyl Glycol-Propyl Glycol (SYSTANE) 0.4-0.3 % GEL Place 1 drop into both eyes 2 (two) times daily.     . RESTASIS 0.05 % ophthalmic emulsion INSTILL ONE DROP IN BOTH  EYES TWO TIMES DAILY 180 each 0  . traMADol (ULTRAM) 50 MG tablet TAKE 1 TABLET BY MOUTH 3  TIMES DAILY 60 tablet 0   No current  facility-administered medications for this visit.      Musculoskeletal: Strength & Muscle Tone: within normal limits Gait & Station: unsteady Patient leans: N/A  Psychiatric Specialty Exam: Review of Systems  Musculoskeletal: Positive for back pain and joint pain.  All other systems reviewed and are negative.   There were no vitals taken for this visit.There is no height or weight on file to calculate BMI.  General Appearance: NA  Eye Contact:  NA  Speech:  Clear and Coherent  Volume:  Normal  Mood:  Dysphoric  Affect:  NA  Thought Process:  Goal Directed  Orientation:  Full (Time, Place, and Person)  Thought Content: Rumination   Suicidal Thoughts:  No  Homicidal Thoughts:  No  Memory:  Immediate;   Good Recent;   Good Remote;   Good  Judgement:  Good  Insight:  Good  Psychomotor Activity:  Decreased  Concentration:  Concentration: Good and Attention Span: Good  Recall:  Good  Fund of Knowledge: Good  Language: Good  Akathisia:  No  Handed:  Right  AIMS (if indicated): not done  Assets:  Communication Skills Desire for Improvement Resilience Social Support Talents/Skills  ADL's:  Intact  Cognition: WNL  Sleep:  Good   Screenings: GAD-7     Office Visit from 12/22/2018 in [place] at [facility]  Total GAD-7 Score  5    Mini-Mental     Clinical Support from 12/11/2017 in [place] at [place] [facility] from 12/09/2016 in [place] at [place] from 11/30/2015 in [place] at [facility]  Total Score (max 30 points )  20  19  18     PHQ2-9     Office Visit from 12/22/2018 in [place] at [facility] from 12/21/2018 in [place] at [facility] from 12/11/2017 in [place] at [facility] from 12/09/2016 in [place] at [facility] from 11/30/2015 in [place] at [facility] Total  Score  0  1  0  2  4  PHQ-9 Total Score  4  1  0  4  13       Assessment and Plan: This patient is a 68 year old female with a history of depression anxiety.  Despite all the recent stressors and losses she is doing fairly well on her medication.  She will continue Lexapro 20 mg twice daily for depression amitriptyline 25 mg at bedtime for sleep and depression and Xanax 0.5 mg twice daily for anxiety.  She will return to see me in 3 months   [name], MD 02/24/2019, 10:22 AM

## 2019-02-28 ENCOUNTER — Other Ambulatory Visit: Payer: Self-pay | Admitting: Family Medicine

## 2019-03-01 ENCOUNTER — Telehealth: Payer: Self-pay | Admitting: Rheumatology

## 2019-03-01 NOTE — Telephone Encounter (Signed)
Patient left a voicemail stating she has lower back pain and is requesting a prescription for a muscle relaxer.

## 2019-03-01 NOTE — Telephone Encounter (Signed)
Patient states she is taking tramadol as prescribed and she continues to have lower back pain. Patient states the pain and muscle spasms started yesterday and she is requesting a muscle relaxer. I advised patient to contact her PCP since Dr. Estanislado Pandy is out of the office until tomorrow. Patient verbalized understanding.

## 2019-03-08 ENCOUNTER — Other Ambulatory Visit: Payer: Self-pay | Admitting: Family Medicine

## 2019-03-08 NOTE — Telephone Encounter (Signed)
Gabapentin Last filled:  01/18/19, #450 Last OV:  01/25/19, acute sinus problem Next OV:  06/22/19, 6 mo f/u

## 2019-04-09 ENCOUNTER — Other Ambulatory Visit: Payer: Self-pay | Admitting: *Deleted

## 2019-04-09 DIAGNOSIS — M3501 Sicca syndrome with keratoconjunctivitis: Secondary | ICD-10-CM

## 2019-04-09 MED ORDER — HYDROXYCHLOROQUINE SULFATE 200 MG PO TABS: 200.0000 mg | ORAL_TABLET | Freq: Two times a day (BID) | ORAL | 0 refills | Status: DC

## 2019-04-09 NOTE — Telephone Encounter (Signed)
Refill request received via fax  Last Visit: 10/27/18 Next Visit: 04/27/19 Labs: 01/21/19 stable  PLQ Eye Exam: 08/03/2018 WNL   Okay to refill per Dr. Estanislado Pandy

## 2019-04-19 NOTE — Progress Notes (Signed)
Office Visit Note  Patient: Alexandria Leach             Date of Birth: 08/04/1950           MRN: [id number]             PCP: Ria Bush, MD Referring: Ria Bush, MD Visit Date: 04/27/2019 Occupation: @GUAROCC @  Subjective:  Sicca symptoms   History of Present Illness: Alexandria Leach is a 69 y.o. female with history of Sjogren's, fibromyalgia, and osteoarthritis.  She is taking Plaquenil 200 mg 1 tablet twice daily.  She reports that she continues have chronic sicca symptoms which have been worse through the wintertime.  She has tried taking pilocarpine in the past but could not tolerate it due to experiencing excessive sweating.  She uses Restasis eyedrops twice daily and Systane as needed for symptomatic relief.  She also uses Biotene products including the toothpaste and mouth rinse for mouth dryness. She has generalized pain muscle aches and muscle tenderness due to fibromyalgia.  She is experiencing increased lower back pain and trapezius muscle spasms bilaterally.  She uses a heating pad on occasion.  She also uses Voltaren gel very sparingly for pain relief.  She takes tramadol as needed for pain relief.  She takes amitriptyline 25 mg po at bedtime for insomnia.    Activities of Daily Living:  Patient reports morning stiffness for 1 hour.   Patient Reports nocturnal pain.  Difficulty dressing/grooming: Reports Difficulty climbing stairs: Reports Difficulty getting out of chair: Reports Difficulty using hands for taps, buttons, cutlery, and/or writing: Reports  Review of Systems  Constitutional: Positive for fatigue.  HENT: Positive for mouth dryness and nose dryness. Negative for mouth sores.   Eyes: Positive for dryness. Negative for itching.  Respiratory: Negative for shortness of breath, wheezing and difficulty breathing.   Cardiovascular: Negative for chest pain and palpitations.  Gastrointestinal: Negative for blood in stool, constipation and  diarrhea.  Endocrine: Negative for increased urination.  Genitourinary: Negative for difficulty urinating and painful urination.  Musculoskeletal: Positive for arthralgias, joint pain and morning stiffness. Negative for joint swelling.  Skin: Positive for rash.  Allergic/Immunologic: Negative for susceptible to infections.  Neurological: Negative for dizziness, numbness, headaches, memory loss and weakness.  Hematological: Positive for bruising/bleeding tendency.  Psychiatric/Behavioral: Negative for confusion and sleep disturbance.    PMFS History:  Patient Active Problem List   Diagnosis Date Noted  . Acute sinusitis 01/25/2019  . Acute right flank pain 07/16/2018  . Sinus tachycardia 05/27/2018  . Transient alteration of awareness 04/04/2018  . Abnormal serum thyroid stimulating hormone (TSH) level 01/30/2018  . 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 Thyroid nodule 09/16/2017  . Neck pain on right side 05/14/2017  . Sialoadenitis of submandibular gland 10/29/2016  . High risk medication use 06/05/2016  . Peripheral neuropathy 01/30/2016  . DNR no code (do not resuscitate) 12/08/2015  . 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 TIA (transient ischemic attack) 10/12/2015  . Mild mitral regurgitation 10/12/2015  . Osteopenia 09/18/2015  . Right arm pain 08/29/2015  . History of fall   . Right sided sciatica 02/21/2015  . Imbalance 01/12/2015  . Transaminitis 12/24/2014  . DDD (degenerative disc disease), cervical   . Health maintenance examination 10/18/2014  . Advanced care planning/counseling discussion 10/18/2014  . Medicare annual wellness visit, initial 10/18/2014  . Refusal of blood transfusions as patient is Jehovah's Witness   . Sjogren's syndrome ([place])   . CKD (chronic  kidney disease) stage 3, GFR 30-59 ml/min   . Glaucoma   . Fibromyalgia   . Osteoarthritis   . History of pulmonary embolism   . Lung nodule 09/22/2012  . Diastolic CHF  ([place]) [id number]  . Aortic stenosis 04/22/2011  . Palpitations 08/10/2010  . HOT FLASHES 06/28/2008  . ARTHRALGIA 07/01/2007  . BACK PAIN, LEFT 08/25/2006  . GAD (generalized anxiety disorder) 03/28/2006  . History of mitral valve repair 03/28/2006  . TAH/BSO, HX OF 03/28/2006  . HYPERCHOLESTEROLEMIA 12/31/2005  . MDD (major depressive disorder), recurrent episode ([place]) 12/31/2005  . RHEUMATIC HEART DISEASE 12/31/2005  . Essential hypertension 12/31/2005  . Chronic insomnia 12/31/2005    Past Medical History:  Diagnosis Date  . Ankle fracture, left 02/19/2015   from a fall  . Anxiety   . Cataract 2014   corrected with surgery  . CHF (congestive heart failure) ([place])   . CKD (chronic kidney disease) stage 3, GFR 30-59 ml/min    saw nephrologist Dr Harden Mo  . DDD (degenerative disc disease), cervical   . Depression   . Fibromyalgia   . Glaucoma    s/p surgery, sees ophtho Q6 mo  . 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 History of kidney stones 2010  . History of pulmonary embolism 2001, 2006   completed coumadin courses  . History of rheumatic fever x3  . HLD (hyperlipidemia)   . HTN (hypertension)   . Insomnia   . Lung nodule 09/22/2012   RLL - 88mm, stable since 2014. Thought benign.   . Osteoarthritis    shoulders and knees, not RA per Dr Estanislado Pandy, positive ANA, positive Ro  . Osteopenia 09/18/2015   DEXA T -1.1 hip, -0.2 spine 08/2015   . Personal history of urinary calculi latest 2014  . Pneumonia 12/02/2011  . PONV (postoperative nausea and vomiting)   . Refusal of blood transfusions as patient is Jehovah's Witness   . Rheumatic heart disease 1980   s/p mitral valve repair 1980  . Sjogren's syndrome ([place])   . Trimalleolar fracture of left ankle 02/23/2015    Family History  Problem Relation Age of Onset  . Lupus Sister        and niece  . Cancer Mother        lung (nonsmoker)  . CAD Mother         MI in her 61s  . ALS Mother   . Kidney disease Father   . Alcohol abuse Father   . Diabetes Father   . Cancer Brother        bone  . Diabetes Sister   . Stroke Sister   . Cancer Maternal Uncle        bone  . Depression Sister   . Lupus Sister   . Kidney failure Other        on HD  . Diabetes Brother   . Heart attack Brother   . Stroke Maternal Grandmother   . Blindness Sister   . Healthy Son   . Healthy Son   . Healthy Son   . Healthy Son    Past Surgical History:  Procedure Laterality Date  . BREAST BIOPSY Right 2006   benign  . CHOLECYSTECTOMY  11/27/2011   Procedure: LAPAROSCOPIC CHOLECYSTECTOMY WITH INTRAOPERATIVE CHOLANGIOGRAM;  Surgeon: Adin Hector, MD;  Location: [place];  Service: General;  Laterality: N/A;  laparoscopic cholecystectomy with choleangiogram umbilical hernia repair  .  COLONOSCOPY  07/2014   WNL Amedeo Plenty)  . dexa  08/2012   normal per patient - no records available  . EYE SURGERY Bilateral 2014   cataract removal  . MITRAL VALVE REPAIR  1980   open heart  . ORIF ANKLE FRACTURE Left 02/26/2015   Procedure: OPEN REDUCTION INTERNAL FIXATION (ORIF) LEFT TRIMALLEOLAR ANKLE FRACTURE;  Surgeon: [name], MD;  Location: [place];  Service: Orthopedics;  Laterality: Left;  . TUBAL LIGATION  1980  . UMBILICAL HERNIA REPAIR  11/27/2011   Procedure: HERNIA REPAIR UMBILICAL ADULT;  Surgeon: Adin Hector, MD;  Location: [place];  Service: General;  Laterality: N/A;  . [place]   for fibroids -- partial, ovaries remain   Social History   Social History Narrative   Lives with son, 1 dog   Occupation: unemployed, on disability for fibromyalgia since 2008.   Edu: HS   Religion: Jehova's witness   Activity: volunteers at senior center   Diet: some water, fruits/vegetables daily   No caffeine use   Immunization History  Administered Date(s) Administered  . Pneumococcal Conjugate-13 12/15/2017  . Pneumococcal Polysaccharide-23  09/15/2012, 12/22/2018     Objective: Vital Signs: BP (!) 143/84 (BP Location: Left Arm, Patient Position: Sitting, Cuff Size: Normal)   Pulse 77   Resp 14   Ht 5\' 1"  (1.549 m)   Wt 193 lb (87.5 kg)   BMI 36.47 kg/m    Physical Exam Vitals and nursing note reviewed.  Constitutional:      Appearance: She is well-developed.  HENT:     Head: Normocephalic and atraumatic.  Eyes:     Conjunctiva/sclera: Conjunctivae normal.  Cardiovascular:     Rate and Rhythm: Normal rate and regular rhythm.     Heart sounds: Normal heart sounds.  Pulmonary:     Effort: Pulmonary effort is normal.     Breath sounds: Normal breath sounds.  Abdominal:     General: Bowel sounds are normal.     Palpations: Abdomen is soft.  Musculoskeletal:     Cervical back: Normal range of motion.  Lymphadenopathy:     Cervical: No cervical adenopathy.  Skin:    General: Skin is warm and dry.     Capillary Refill: Capillary refill takes less than 2 seconds.  Neurological:     Mental Status: She is alert and oriented to person, place, and time.  Psychiatric:        Behavior: Behavior normal.      Musculoskeletal Exam: C-spine, thoracic spine, lumbar spine good range of motion.  No midline spinal tenderness.  No SI joint tenderness.  Shoulder joints, elbow joints, wrist joints, MCPs, PIPs and DIPs good range of motion with no synovitis.  She has a limited range of motion of the right wrist joint.  She has synovial thickening of bilateral first DIP joints.  Hip joints have good range of motion no discomfort.  No tenderness over trochanteric bursa.  She has left knee crepitus on exam.  No warmth or effusion of knee joints noted.  Ankle joints have good range of motion with no tenderness or inflammation.  No tenderness of MTP joints.  CDAI Exam: CDAI Score: -- Patient Global: --; Provider Global: -- Swollen: --; Tender: -- Joint Exam 04/27/2019   No joint exam has been documented for this visit   There is  currently no information documented on the homunculus. Go to the Rheumatology activity and complete the homunculus joint exam.  Investigation:  No additional findings.  Imaging: No results found.  Recent Labs: Lab Results  Component Value Date   WBC 4.1 04/20/2019   HGB 12.1 04/20/2019   PLT 241 04/20/2019   NA 139 04/20/2019   K 4.2 04/20/2019   CL 109 04/20/2019   CO2 21 04/20/2019   GLUCOSE 132 (H) 04/20/2019   BUN 10 04/20/2019   CREATININE 1.28 (H) 04/20/2019   BILITOT 0.4 04/20/2019   ALKPHOS 71 07/16/2018   AST 26 04/20/2019   ALT 15 04/20/2019   PROT 7.1 04/20/2019   ALBUMIN 4.2 12/15/2018   CALCIUM 9.7 04/20/2019   GFRAA 50 (L) 04/20/2019    Speciality Comments: PLQ Eye Exam: 08/03/2018 WNL @ [facility] Follow up in 1 year  Procedures:  No procedures performed Allergies: Cymbalta [duloxetine hcl], Statins, and Sulfa antibiotics   Assessment / Plan:     Visit Diagnoses: Sjogren's syndrome with keratoconjunctivitis sicca ([place]) - +ANA, +Ro: She has persistent and severe sicca symptoms.  She is taking Plaquenil 200 mg 1 tablet by mouth twice daily.  She has tried taking pilocarpine in the past but discontinued due to excessive sweating.  We discussed trying a low-dose of pilocarpine but she declined at this time.  We also discussed switching her to Evoxac, but I explained that 19% of patients experience diaphoresis so she declined at this time. She has been using Restasis eyedrops twice daily and Systane as needed for eye dryness.  She has also been using Biotene products including the toothpaste and mouth rinse.  I discussed trying Xylimelt OTC for symptomatic relief.  We also discussed buying a humidifier to start using on a nightly basis.  I explained that amitriptyline is likely contributing to her symptoms as well.  She does not want to discontinue matriculate at this time.  She will continue taking Plaquenil as prescribed and try over-the-counter products for  symptomatic relief.  She will follow-up in the office in 5 months.  High risk medication use - PLQ 200 mg 1 tablet by mouth BID. CBC within normal limits and CMP stable on 04/20/2019.PLQ Eye Exam: 08/03/2018 WNL @ [facility] Follow up in 1 year   Fibromyalgia: She has generalized muscle aches muscle tenderness due to fibromyalgia.  She has been experiencing trapezius muscle tension and muscle spasms intermittently.  We discussed using Biofreeze or Salonpas over-the-counter for symptomatic relief.  We discussed trying to avoid Voltaren gel due to her underlying chronic kidney disease.  She takes tramadol as needed for pain relief.  We discussed the importance of regular exercise and good sleep hygiene.  She is going to try stretching twice daily and walking around her house for exercise.  We discussed that water therapy would be beneficial in the future if she has access to a pool.  She has been taking amitriptyline 25 mg by mouth at bedtime for insomnia.  She has been sleeping well on his current regimen and does not want to make any changes at this time.  Other insomnia: She is taking amitriptyline 25 mg 1 tablet by mouth at bedtime for insomnia.  Other chronic pain: She takes tramadol as needed for pain relief.  Other fatigue: She has chronic fatigue.  Discussed importance of regular exercise.  DDD (degenerative disc disease), cervical: She has good range of motion of her C-spine with no discomfort.  No symptoms of radiculopathy.  She is trapezius muscle tension muscle tenderness bilaterally.  We discussed using Biofreeze or Salonpas patches over-the-counter for symptomatic  relief.  Other medical conditions are listed as follows:  Osteopenia of multiple sites  History of pulmonary embolism  History of hypertension  Stage 3a chronic kidney disease  History of anxiety  History of hypercholesterolemia  Orders: No orders of the defined types were placed in this encounter.  No orders  of the defined types were placed in this encounter.   Face-to-face time spent with patient was 30 minutes. Greater than 50% of time was spent in counseling and coordination of care.  Follow-Up Instructions: Return for Sjogren's syndrome.   Ofilia Neas, PA-C   I examined and evaluated the patient with Hazel Sams PA.  Patient she is unable to tolerate pilocarpine.  Over-the-counter products were discussed.  She is on Plaquenil.  Which she is tolerating well.  She had no synovitis on examination.  The plan of care was discussed as noted above.  Bo Merino, MD  Note - This record has been created using Editor, commissioning.  Chart creation errors have been sought, but may not always  have been located. Such creation errors do not reflect on  the standard of medical care.

## 2019-04-20 ENCOUNTER — Other Ambulatory Visit: Payer: Self-pay | Admitting: *Deleted

## 2019-04-20 DIAGNOSIS — Z79899 Other long term (current) drug therapy: Secondary | ICD-10-CM

## 2019-04-20 LAB — COMPLETE METABOLIC PANEL WITH GFR
AG Ratio: 1.5 (calc) (ref 1.0–2.5)
ALT: 15 U/L (ref 6–29)
AST: 26 U/L (ref 10–35)
Albumin: 4.3 g/dL (ref 3.6–5.1)
Alkaline phosphatase (APISO): 57 U/L (ref 37–153)
BUN/Creatinine Ratio: 8 (calc) (ref 6–22)
BUN: 10 mg/dL (ref 7–25)
CO2: 21 mmol/L (ref 20–32)
Calcium: 9.7 mg/dL (ref 8.6–10.4)
Chloride: 109 mmol/L (ref 98–110)
Creat: 1.28 mg/dL — ABNORMAL HIGH (ref 0.50–0.99)
GFR, Est African American: 50 mL/min/{1.73_m2} — ABNORMAL LOW (ref >=60)
GFR, Est Non African American: 44 mL/min/{1.73_m2} — ABNORMAL LOW (ref >=60)
Globulin: 2.8 g/dL (calc) (ref 1.9–3.7)
Glucose, Bld: 132 mg/dL — ABNORMAL HIGH (ref 65–99)
Potassium: 4.2 mmol/L (ref 3.5–5.3)
Sodium: 139 mmol/L (ref 135–146)
Total Bilirubin: 0.4 mg/dL (ref 0.2–1.2)
Total Protein: 7.1 g/dL (ref 6.1–8.1)

## 2019-04-20 LAB — CBC WITH DIFFERENTIAL/PLATELET
Absolute Monocytes: 373 cells/uL (ref 200–950)
Basophils Absolute: 29 cells/uL (ref 0–200)
Basophils Relative: 0.7 %
Eosinophils Absolute: 119 cells/uL (ref 15–500)
Eosinophils Relative: 2.9 %
HCT: 37.4 % (ref 35.0–45.0)
Hemoglobin: 12.1 g/dL (ref 11.7–15.5)
Lymphs Abs: 1661 cells/uL (ref 850–3900)
MCH: 27.3 pg (ref 27.0–33.0)
MCHC: 32.4 g/dL (ref 32.0–36.0)
MCV: 84.4 fL (ref 80.0–100.0)
MPV: 10.5 fL (ref 7.5–12.5)
Monocytes Relative: 9.1 %
Neutro Abs: 1919 cells/uL (ref 1500–7800)
Neutrophils Relative %: 46.8 %
Platelets: 241 10*3/uL (ref 140–400)
RBC: 4.43 10*6/uL (ref 3.80–5.10)
RDW: 13.1 % (ref 11.0–15.0)
Total Lymphocyte: 40.5 %
WBC: 4.1 10*3/uL (ref 3.8–10.8)

## 2019-04-21 NOTE — Progress Notes (Signed)
CBC is normal.  Glucose is elevated most likely not fasting.  Creatinine is is elevated but stable.  Please forward labs to her PCP.

## 2019-04-27 ENCOUNTER — Encounter: Payer: Self-pay | Admitting: Rheumatology

## 2019-04-27 ENCOUNTER — Ambulatory Visit (INDEPENDENT_AMBULATORY_CARE_PROVIDER_SITE_OTHER): Payer: Medicare Other | Admitting: Rheumatology

## 2019-04-27 ENCOUNTER — Other Ambulatory Visit: Payer: Self-pay

## 2019-04-27 VITALS — BP 143/84 | HR 77 | Resp 14 | Ht 61.0 in | Wt 193.0 lb

## 2019-04-27 DIAGNOSIS — G8929 Other chronic pain: Secondary | ICD-10-CM | POA: Diagnosis not present

## 2019-04-27 DIAGNOSIS — Z79899 Other long term (current) drug therapy: Secondary | ICD-10-CM | POA: Diagnosis not present

## 2019-04-27 DIAGNOSIS — M797 Fibromyalgia: Secondary | ICD-10-CM | POA: Diagnosis not present

## 2019-04-27 DIAGNOSIS — G4709 Other insomnia: Secondary | ICD-10-CM

## 2019-04-27 DIAGNOSIS — Z8659 Personal history of other mental and behavioral disorders: Secondary | ICD-10-CM

## 2019-04-27 DIAGNOSIS — R5383 Other fatigue: Secondary | ICD-10-CM

## 2019-04-27 DIAGNOSIS — M3501 Sicca syndrome with keratoconjunctivitis: Secondary | ICD-10-CM

## 2019-04-27 DIAGNOSIS — N1831 Chronic kidney disease, stage 3a: Secondary | ICD-10-CM

## 2019-04-27 DIAGNOSIS — Z8639 Personal history of other endocrine, nutritional and metabolic disease: Secondary | ICD-10-CM

## 2019-04-27 DIAGNOSIS — Z86711 Personal history of pulmonary embolism: Secondary | ICD-10-CM

## 2019-04-27 DIAGNOSIS — M5030 Other cervical disc degeneration, unspecified cervical region: Secondary | ICD-10-CM

## 2019-04-27 DIAGNOSIS — M8589 Other specified disorders of bone density and structure, multiple sites: Secondary | ICD-10-CM

## 2019-04-27 DIAGNOSIS — Z8679 Personal history of other diseases of the circulatory system: Secondary | ICD-10-CM

## 2019-04-28 ENCOUNTER — Other Ambulatory Visit: Payer: Self-pay | Admitting: Family Medicine

## 2019-05-12 ENCOUNTER — Telehealth: Payer: Self-pay | Admitting: Family Medicine

## 2019-05-12 NOTE — Chronic Care Management (AMB) (Signed)
  Chronic Care Management   Note  05/12/2019 Name: Alexandria Leach MRN: [id number] DOB: [birth year]  Alexandria Leach is a 69 y.o. year old female who is a primary care patient of Ria Bush, MD. I reached out to [employer] by phone today in response to a referral sent by Alexandria Leach's PCP, Ria Bush, MD.   Alexandria Leach was given information about Chronic Care Management services today including:  1. CCM service includes personalized support from designated clinical staff supervised by her physician, including individualized plan of care and coordination with other care providers 2. 24/7 contact phone numbers for assistance for urgent and routine care needs. 3. Service will only be billed when office clinical staff spend 20 minutes or more in a month to coordinate care. 4. Only one practitioner may furnish and bill the service in a calendar month. 5. The patient may stop CCM services at any time (effective at the end of the month) by phone call to the office staff. 6. The patient will be responsible for cost sharing (co-pay) of up to 20% of the service fee (after annual deductible is met).  Patient agreed to services and verbal consent obtained.   Follow up plan:   Alexandria Leach [employer] Scheduler

## 2019-05-19 ENCOUNTER — Telehealth: Payer: Self-pay

## 2019-05-19 DIAGNOSIS — E7800 Pure hypercholesterolemia, unspecified: Secondary | ICD-10-CM

## 2019-05-19 DIAGNOSIS — I10 Essential (primary) hypertension: Secondary | ICD-10-CM

## 2019-05-19 NOTE — Telephone Encounter (Signed)
I would like to request a referral for Alexandria Leach to chronic care management pharmacy services for the following conditions:   Essential hypertension, benign  [I10]  HYPERCHOLESTEROLEMIA [E78.00]   Debbora Dus, PharmD Clinical Pharmacist [facility] [phone]

## 2019-05-25 NOTE — Chronic Care Management (AMB) (Signed)
Chronic Care Management Pharmacy  Name: Alexandria Leach  MRN: [id number] DOB: 1950-10-12  Chief Complaint/ HPI  Alexandria Leach,  69 y.o., female presents for their Initial CCM visit with the clinical pharmacist via telephone.  PCP : Ria Bush, MD  Their chronic conditions include: hypertension, heart failure, peripheral neuropathy, osteoarthritis, osteopenia, CKD, hypercholesterolemia, generalized anxiety disorder, depression, chronic insomnia, Sjogren's Syndrome, glaucoma, fibromyalgia, history of pulmonary embolism, arm pain  Patient concerns: no questions about medications, denies any medication changes in the past year; reports fell off chair, some pain on head and wrist, but resolved in about 3 days; also reports thyroid nodule - increase in hoarseness and difficulty swallowing meats and bread over the last couple of months   Office Visits:  01/25/19: Danise Mina - acute sinusitis - Augmentin 7DS   12/22/18: Danise Mina - continue current meds   Consult Visit:  04/27/19: Rheumatology - dry mouth - try Xylimelt OTC, pain - trial Biofreeze or Salonpas patches OTC, consider d/c amitriptyline, pt declined, declined pilocarpine due to sweating  02/24/19: Depression/Anxiety - continue current meds  Allergies  Allergen Reactions  . Cymbalta [Duloxetine Hcl] Other (See Comments)    tachycardia  . Statins Nausea Only and Other (See Comments)    Muscle cramps also  . Sulfa Antibiotics Nausea And Vomiting   Medications: Outpatient Encounter Medications as of 05/27/2019  Medication Sig  . ALPRAZolam (XANAX) 0.5 MG tablet Take 1 tablet (0.5 mg total) by mouth 2 (two) times daily.  Marland Kitchen amitriptyline (ELAVIL) 25 MG tablet Take 1 tablet (25 mg total) by mouth at bedtime.  Marland Kitchen antiseptic oral rinse (BIOTENE) LIQD 15 mLs by Mouth Rinse route at bedtime.   Marland Kitchen aspirin 81 MG tablet Take 1 tablet (81 mg total) by mouth daily.  . cholecalciferol (VITAMIN D) 1000 UNITS tablet Take 1,000  Units by mouth daily.   Marland Kitchen Co-Enzyme Q-10 100 MG CAPS Take 1 capsule (100 mg total) by mouth daily.  . diclofenac sodium (VOLTAREN) 1 % GEL Apply 1 application topically 2 (two) times daily as needed (pain).  Marland Kitchen diltiazem (CARDIZEM CD) 360 MG 24 hr capsule Take 1 capsule (360 mg total) by mouth daily.  Marland Kitchen escitalopram (LEXAPRO) 20 MG tablet Take 1 tablet (20 mg total) by mouth 2 (two) times daily.  Marland Kitchen ezetimibe (ZETIA) 10 MG tablet TAKE 1 TABLET BY MOUTH AT  BEDTIME  . famotidine (PEPCID) 20 MG tablet TAKE 1 TABLET BY MOUTH TWO  TIMES DAILY  . furosemide (LASIX) 20 MG tablet TAKE 1 TABLET BY MOUTH  DAILY AS NEEDED FOR EDEMA  . gabapentin (NEURONTIN) 300 MG capsule TAKE 2 CAPSULES BY MOUTH  EVERY MORNING, 1 CAPSULE  EVERY AFTERNOON AND 2  CAPSULES AT BEDTIME.  . hydroxychloroquine (PLAQUENIL) 200 MG tablet Take 1 tablet (200 mg total) by mouth 2 (two) times daily.  Marland Kitchen lovastatin (MEVACOR) 10 MG tablet TAKE 1 TABLET BY MOUTH  EVERY MONDAY, WEDNESDAY,  AND FRIDAY  . metoprolol tartrate (LOPRESSOR) 50 MG tablet TAKE 1 TABLET BY MOUTH  TWICE DAILY  . Polyethyl Glycol-Propyl Glycol (SYSTANE) 0.4-0.3 % GEL Place 1 drop into both eyes 2 (two) times daily.   . RESTASIS 0.05 % ophthalmic emulsion INSTILL ONE DROP IN BOTH  EYES TWO TIMES DAILY  . traMADol (ULTRAM) 50 MG tablet TAKE 1 TABLET BY MOUTH 3  TIMES DAILY   No facility-administered encounter medications on file as of 05/27/2019.   Current Diagnosis/Assessment: Goals    . Increase physical activity  Starting 12/11/2017, I will continue to walk at least 60 min day/4 days per week.     . Patient Stated     12/21/2018, Patient wants to improve her ability to walk better and increase her mobility.     . Pharmacy Care Plan     Current Barriers:  . Chronic Disease Management support, education, and care coordination needs related to hypertension, heart failure, peripheral neuropathy, osteoarthritis, osteopenia, CKD, hypercholesterolemia, generalized  anxiety disorder, depression, chronic insomnia, Sjogren's Syndrome, glaucoma, fibromyalgia, history of pulmonary embolism, arm pain  Pharmacist Clinical Goal(s):  Marland Kitchen Maintain blood pressure within goal of less than 140/90 mmHg. Continue current medications. Continue to check blood pressure daily. Will consult with your physician for dose adjustments.  . Improve back pain. May try lidocaine  4% patches over the counter. Avoid water and heat application while wearing patch. Read instructions carefully.  . Improve dry eyes secondary to Sjogren's Syndrome. Try increasing Systane eye drops up to twice daily.   Interventions: . Comprehensive medication review performed.  Patient Self Care Activities:  . Self administers medications as prescribed  Initial goal documentation      Hyperlipidemia   CBC Latest Ref Rng & Units 04/20/2019 01/21/2019 10/06/2018  WBC 3.8 - 10.8 Thousand/uL 4.1 4.4 3.8(L)  Hemoglobin 11.7 - 15.5 g/dL 12.1 11.9 12.1  Hematocrit 35.0 - 45.0 % 37.4 38.2 38.0  Platelets 140 - 400 Thousand/uL 241 228 210.0   Lipid Panel     Component Value Date/Time   CHOL 175 12/15/2018 0801   TRIG 70.0 12/15/2018 0801   HDL 80.40 12/15/2018 0801   CHOLHDL 2 12/15/2018 0801   VLDL 14.0 12/15/2018 0801   LDLCALC 80 12/15/2018 0801   LDLDIRECT 77.0 12/09/2016 0842    LDL goal < 100 Mitral valve repair 1980; sees cardiology once a year, aortic and mitral valve leak   Patient has failed these meds in past: none Patient is currently controlled on the following medications:   Aspirin 81 mg - 1 tablet daily  CoQ10 100 mg - 1 tablet daily (AM)   Lovastatin 10 mg - 1 tablet on M,W,F (PM)  Ezetimibe 10 mg - 1 tablet daily (PM)  We discussed: tolerating lovastatin well, taking CoQ10 to reduce myopathy   Plan: Continue current medications  Heart Failure   Type: Diastolic Last ejection fraction: 05/2017 (65-70%) NYHA Class: II (slight limitation of activity) - can do most normal  activities, unable to walk up many steps without palpitations or SOB  AHA HF Stage: C (Heart disease and symptoms present)  Patient has failed these meds in past: none Patient is currently controlled on the following medications:   Furosemide 20 mg - 1 tablet daily PRN  Metoprolol tartrate 50 mg - 1 tablet BID  We discussed: takes furosemide PRN depending on swelling, watching salt; weighs daily first thing in the morning, aware of 3 lb/5 lb limits, reports SOB is stable  Plan: Continue current medications  Hypertension   CMP Latest Ref Rng & Units 04/20/2019 01/21/2019 12/15/2018  Glucose 65 - 99 mg/dL 132(H) 88 84  BUN 7 - 25 mg/dL 10 9 7   Creatinine 0.50 - 0.99 mg/dL 1.28(H) 1.27(H) 1.20  Sodium 135 - 146 mmol/L 139 142 137  Potassium 3.5 - 5.3 mmol/L 4.2 4.3 4.1  Chloride 98 - 110 mmol/L 109 108 105  CO2 20 - 32 mmol/L 21 26 24   Calcium 8.6 - 10.4 mg/dL 9.7 9.8 9.5  Total Protein 6.1 - 8.1 g/dL  7.1 7.2 -  Total Bilirubin 0.2 - 1.2 mg/dL 0.4 0.5 -  Alkaline Phos 39 - 117 U/L - - -  AST 10 - 35 U/L 26 22 -  ALT 6 - 29 U/L 15 14 -   Office blood pressures are  BP Readings from Last 3 Encounters:  04/27/19 (!) 143/84  01/25/19 (!) 178/98  12/22/18 (!) 144/82   UACR: 1.2 Pulse 04/27/19: 77 Patient has failed these meds in the past: denies any failed therapies  Patient checks BP at home daily - before breakfast/before medications, checks again in afternoon and its usually higher if not the same Patient home BP readings are ranging: 172/100, 180/99 Patient is currently uncontrolled on the following medications:   Diltiazem 360 mg 24 hr - 1 capsule daily (palpitations - AM)  Metoprolol tartrate 50 mg - 1 tablet BID   We discussed: discussed dose adjustments; reports BP has been up for the past few months due to stress   Plan: Continue current medications; Consider increasing metoprolol dose. Consult with PCP for medication changes.   Osteoarthritis/Neuropathic Pain/DDD    Patient has failed these meds in past: Tylenol - not very effective  Patient is currently uncontrolled on the following medications:   Gabapentin 300 mg - 2 caps AM, 1 cap noon, 2 caps PM  Tramadol 50 mg - 1 tablet TID PRN  Diclofenac 1% gel - Apply daily QID   Amitriptyline 25 mg - 1 tablet daily at bedtime   We discussed:  Nerve pain - gabapentin helps a lot; Osteoarthritis - takes tramadol only if pain is very severe, maybe once a week, applies diclofenac gel to shoulder 3-4 times a day - works well; reports pain is not well controlled with current therapy   Plan: Continue current medications; Try lidocaine patch OTC for back pain.   Sjogren's Syndrome    Patient is currently controlled on the following medications:   Restasis 0.05% - 1 drop in both eyes BID   Systane 0.4-0.3% gel - 1 drop in both eyes BID (using maybe once a day)  Hydroxychloroquine 200 mg - 1 tablet BID  Biotene spray for dry mouth (uses PRN throughout the day)  Biotene rinse (uses after brushing)   We discussed: dryness - worst is eyes, not taking Systane as regularly as she used to, try increasing to at least once daily; dry mouth: Biotene is not super helpful, hasn't tried Xylimelt, reports rheumatology suggested maybe d/c Plaquenil as not resolving dryness, but pt is unsure about that at this time  Plan: Continue current medications; Increase Systane use OTC as needed for dryness. Continue to monitor dry mouth.   GAD/MDD   Followed by [name], MD Patient has failed these meds in past: none Patient is currently controlled on the following medications:   Alprazolam 0.5 mg - 1 tablet BID  Amitriptyline 25 mg - 1 tablet qHs sleep   Escitalopram 20 mg - 1 tablet daily    We discussed: lost 4 family members since 2020, a lot of stress lately   Assessment: Overall reports well controlled on current therapy   Anxiety: takes Xanax PRN, more recently BID with family loss, previously once a  day  Depressed mood: reports stable on Lexapro daily  Sleep: takes amitriptyline 2-3 days a week  Plan: Continue current medications  Osteopenia  Vitamin D (12/15/18): 42  DEXA T -1.1 hip, -0.2 spine 08/2015 Fell in 2016, broke ankle in three places  Patient has failed these meds  in past: none   Patient is currently controlled on the following medications:   Vitamin D3 1000 IU - 1 tablet daily  We discussed: eats broccoli, cauliflower and yogurt on a regular basis - reminded about daily calcium intake goals   Plan: Continue current medications  Medication Management  Misc: Famotidine 20 mg - BID (prescribed, helps a lot with heartburn)  Pharmacy/Benefits: [facility], [facility]   Adherence: refills pillbox every Saturday   Affordability: pays $1-5 per medication, offered [facility] if needed in future  Vaccines: Recommend Shingrix, Tdap, influenza   CCM Follow Up:  06/02/19 9:00 AM, telephone for blood pressure assessment   Debbora Dus, PharmD Clinical Pharmacist [facility] [phone]  -------- Dr. Danise Mina,   Patient reports 2 blood pressures this week: 172/100, 180/99 mmHg. She states BP has been this high or higher most days for the past few months. Currently on the following meds:   Diltiazem 360 mg 24 hr - 1 capsule daily (palpitations - AM)  Metoprolol tartrate 50 mg - 1 tablet BID  Furosemide 20 mg - PRN    Electrolytes WNL, last heart rate was 77, AFIB and HFpEF, UACR is < 30; Would you like to increase metoprolol or have another recommendation? I plan to follow up with her in 1 week to assess her BP again.  Thanks!  Sharyn Lull

## 2019-05-26 ENCOUNTER — Other Ambulatory Visit: Payer: Self-pay

## 2019-05-26 ENCOUNTER — Ambulatory Visit (INDEPENDENT_AMBULATORY_CARE_PROVIDER_SITE_OTHER): Payer: Medicare Other | Admitting: Psychiatry

## 2019-05-26 ENCOUNTER — Encounter (HOSPITAL_COMMUNITY): Payer: Self-pay | Admitting: Psychiatry

## 2019-05-26 DIAGNOSIS — F331 Major depressive disorder, recurrent, moderate: Secondary | ICD-10-CM

## 2019-05-26 MED ORDER — ALPRAZOLAM 0.5 MG PO TABS: 0.5000 mg | ORAL_TABLET | Freq: Two times a day (BID) | ORAL | 2 refills | Status: DC

## 2019-05-26 MED ORDER — AMITRIPTYLINE HCL 25 MG PO TABS: 25.0000 mg | ORAL_TABLET | Freq: Every day | ORAL | 2 refills | Status: DC

## 2019-05-26 MED ORDER — ESCITALOPRAM OXALATE 20 MG PO TABS: 20.0000 mg | ORAL_TABLET | Freq: Two times a day (BID) | ORAL | 2 refills | Status: DC

## 2019-05-26 NOTE — Progress Notes (Signed)
Virtual Visit via Telephone Note  I connected with Alexandria Leach on 05/26/19 at  8:40 AM EST by telephone and verified that I am speaking with the correct person using two identifiers.   I discussed the limitations, risks, security and privacy concerns of performing an evaluation and management service by telephone and the availability of in person appointments. I also discussed with the patient that there may be a patient responsible charge related to this service. The patient expressed understanding and agreed to proceed.   I discussed the assessment and treatment plan with the patient. The patient was provided an opportunity to ask questions and all were answered. The patient agreed with the plan and demonstrated an understanding of the instructions.   The patient was advised to call back or seek an in-person evaluation if the symptoms worsen or if the condition fails to improve as anticipated.  I provided 15 minutes of non-face-to-face time during this encounter.   [name], MD  [facility] MD/PA/NP OP Progress Note  05/26/2019 8:59 AM Alexandria Leach  MRN:  [id number]  Chief Complaint:  Chief Complaint    Depression; Anxiety; Follow-up     HPI: This patient is a 69 year old separated black female who lives with her son in [place].  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  She has 4 sons and 3 grandchildren.  The patient returns for follow-up after 3 months last time she had lost her brother and sister in November.  Since then her daughter-in-law died of breast cancer in 03/12/23 and another brother of hers died from a heart attack in 2022-04-11.  All of these tests have been hard on her but she seems to be doing okay on her medication.  She is still having a lot of symptoms from her Sjogren's such as dryness in her eyes and mouth fibromyalgia symptoms and fatigue but she is trying to walk every day.  She is  trying to avoid using the Elavil at bedtime because of the drying effect but she uses it occasionally.  Most the time she is sleeping okay.  Her energy is fairly good considering all of her health problems.  She denies suicidal ideation and her anxiety is under good control. Visit Diagnosis:    ICD-10-CM   1. Major depressive disorder, recurrent episode, moderate (HCC)  F33.1     Past Psychiatric History: none  Past Medical History:  Past Medical History:  Diagnosis Date  . Ankle fracture, left 02/19/2015   from a fall  . Anxiety   . Cataract 2014   corrected with surgery  . CHF (congestive heart failure) ([place])   . CKD (chronic kidney disease) stage 3, GFR 30-59 ml/min    saw nephrologist Dr Harden Mo  . DDD (degenerative disc disease), cervical   . Depression   . Fibromyalgia   . Glaucoma    s/p surgery, sees ophtho Q6 mo  . 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 History of kidney stones 2010  . History of pulmonary embolism 2001, 2006   completed coumadin courses  . History of rheumatic fever x3  . HLD (hyperlipidemia)   . HTN (hypertension)   . Insomnia   . Lung nodule 09/22/2012   RLL - 73mm, stable since 2014. Thought benign.   . Osteoarthritis    shoulders and knees, not RA per Dr Estanislado Pandy, positive ANA,  positive Ro  . Osteopenia 09/18/2015   DEXA T -1.1 hip, -0.2 spine 08/2015   . Personal history of urinary calculi latest 2014  . Pneumonia 12/02/2011  . PONV (postoperative nausea and vomiting)   . Refusal of blood transfusions as patient is Jehovah's Witness   . Rheumatic heart disease 1980   s/p mitral valve repair 1980  . Sjogren's syndrome ([place])   . Trimalleolar fracture of left ankle 02/23/2015    Past Surgical History:  Procedure Laterality Date  . BREAST BIOPSY Right 2006   benign  . CHOLECYSTECTOMY  11/27/2011   Procedure: LAPAROSCOPIC CHOLECYSTECTOMY WITH INTRAOPERATIVE CHOLANGIOGRAM;   Surgeon: Adin Hector, MD;  Location: [facility];  Service: General;  Laterality: N/A;  laparoscopic cholecystectomy with choleangiogram umbilical hernia repair  . COLONOSCOPY  07/2014   WNL Amedeo Plenty)  . dexa  08/2012   normal per patient - no records available  . EYE SURGERY Bilateral 2014   cataract removal  . MITRAL VALVE REPAIR  1980   open heart  . ORIF ANKLE FRACTURE Left 02/26/2015   Procedure: OPEN REDUCTION INTERNAL FIXATION (ORIF) LEFT TRIMALLEOLAR ANKLE FRACTURE;  Surgeon: [name], MD;  Location: [facility];  Service: Orthopedics;  Laterality: Left;  . TUBAL LIGATION  1980  . UMBILICAL HERNIA REPAIR  11/27/2011   Procedure: HERNIA REPAIR UMBILICAL ADULT;  Surgeon: Adin Hector, MD;  Location: [facility];  Service: General;  Laterality: N/A;  . [place]   for fibroids -- partial, ovaries remain    Family Psychiatric History: see below  Family History:  Family History  Problem Relation Age of Onset  . Lupus Sister        and niece  . Cancer Mother        lung (nonsmoker)  . CAD Mother        MI in her 5s  . ALS Mother   . Kidney disease Father   . Alcohol abuse Father   . Diabetes Father   . Cancer Brother        bone  . Diabetes Sister   . Stroke Sister   . Cancer Maternal Uncle        bone  . Depression Sister   . Lupus Sister   . Kidney failure Other        on HD  . Diabetes Brother   . Heart attack Brother   . Stroke Maternal Grandmother   . Blindness Sister   . Healthy Son   . Healthy Son   . Healthy Son   . Healthy Son     Social History:  Social History   Socioeconomic History  . Marital status: Married    Spouse name: [name]  . Number of children: 4  . Years of education: 52  . Highest education level: Not on file  Occupational History    Employer: UNEMPLOYED    Comment: Disability  Tobacco Use  . Smoking status: Never Smoker  . Smokeless tobacco: Never Used  Substance and Sexual Activity  . Alcohol use: No     Alcohol/week: 0.0 standard drinks  . Drug use: No  . Sexual activity: Not Currently    Birth control/protection: Surgical  Other Topics Concern  . Not on file  Social History Narrative   Lives with son, 1 dog   Occupation: unemployed, on disability for fibromyalgia since 2008.   Edu: HS   Religion: Jehova's witness   Activity: volunteers at senior center  Diet: some water, fruits/vegetables daily   No caffeine use   Social Determinants of Health   Financial Resource Strain: Low Risk   . Difficulty of Paying Living Expenses: Not hard at all  Food Insecurity: No Food Insecurity  . Worried About Charity fundraiser in the Last Year: Never true  . Ran Out of Food in the Last Year: Never true  Transportation Needs: No Transportation Needs  . Lack of Transportation (Medical): No  . Lack of Transportation (Non-Medical): No  Physical Activity: Inactive  . Days of Exercise per Week: 0 days  . Minutes of Exercise per Session: 0 min  Stress: Stress Concern Present  . Feeling of Stress : To some extent  Social Connections:   . Frequency of Communication with Friends and Family: Not on file  . Frequency of Social Gatherings with Friends and Family: Not on file  . Attends Religious Services: Not on file  . Active Member of Clubs or Organizations: Not on file  . Attends Archivist Meetings: Not on file  . Marital Status: Not on file    Allergies:  Allergies  Allergen Reactions  . Cymbalta [Duloxetine Hcl] Other (See Comments)    tachycardia  . Statins Nausea Only and Other (See Comments)    Muscle cramps also  . Sulfa Antibiotics Nausea And Vomiting    Metabolic Disorder Labs: Lab Results  Component Value Date   HGBA1C 5.6 10/13/2015   MPG 114 10/13/2015   No results found for: PROLACTIN Lab Results  Component Value Date   CHOL 175 12/15/2018   TRIG 70.0 12/15/2018   HDL 80.40 12/15/2018   CHOLHDL 2 12/15/2018   VLDL 14.0 12/15/2018   LDLCALC 80 12/15/2018    LDLCALC 158 (H) 12/11/2017   Lab Results  Component Value Date   TSH 1.91 10/06/2018   TSH 3.55 04/03/2018    Therapeutic Level Labs: No results found for: LITHIUM No results found for: VALPROATE No components found for:  CBMZ  Current Medications: Current Outpatient Medications  Medication Sig Dispense Refill  . ALPRAZolam (XANAX) 0.5 MG tablet Take 1 tablet (0.5 mg total) by mouth 2 (two) times daily. 180 tablet 2  . amitriptyline (ELAVIL) 25 MG tablet Take 1 tablet (25 mg total) by mouth at bedtime. 90 tablet 2  . antiseptic oral rinse (BIOTENE) LIQD 15 mLs by Mouth Rinse route at bedtime.     Marland Kitchen aspirin 81 MG tablet Take 1 tablet (81 mg total) by mouth daily.    . cholecalciferol (VITAMIN D) 1000 UNITS tablet Take 1,000 Units by mouth daily.     Marland Kitchen Co-Enzyme Q-10 100 MG CAPS Take 1 capsule (100 mg total) by mouth daily.  0  . diclofenac sodium (VOLTAREN) 1 % GEL Apply 1 application topically 2 (two) times daily as needed (pain).    Marland Kitchen diltiazem (CARDIZEM CD) 360 MG 24 hr capsule Take 1 capsule (360 mg total) by mouth daily. 90 capsule 3  . escitalopram (LEXAPRO) 20 MG tablet Take 1 tablet (20 mg total) by mouth 2 (two) times daily. 180 tablet 2  . ezetimibe (ZETIA) 10 MG tablet TAKE 1 TABLET BY MOUTH AT  BEDTIME 90 tablet 3  . famotidine (PEPCID) 20 MG tablet TAKE 1 TABLET BY MOUTH TWO  TIMES DAILY 180 tablet 3  . furosemide (LASIX) 20 MG tablet TAKE 1 TABLET BY MOUTH  DAILY AS NEEDED FOR EDEMA 90 tablet 1  . gabapentin (NEURONTIN) 300 MG capsule TAKE 2 CAPSULES BY  MOUTH  EVERY MORNING, 1 CAPSULE  EVERY AFTERNOON AND 2  CAPSULES AT BEDTIME. 450 capsule 3  . hydroxychloroquine (PLAQUENIL) 200 MG tablet Take 1 tablet (200 mg total) by mouth 2 (two) times daily. 180 tablet 0  . lovastatin (MEVACOR) 10 MG tablet TAKE 1 TABLET BY MOUTH  EVERY MONDAY, WEDNESDAY,  AND FRIDAY 39 tablet 3  . metoprolol tartrate (LOPRESSOR) 50 MG tablet TAKE 1 TABLET BY MOUTH  TWICE DAILY 180 tablet 3  .  Polyethyl Glycol-Propyl Glycol (SYSTANE) 0.4-0.3 % GEL Place 1 drop into both eyes 2 (two) times daily.     . RESTASIS 0.05 % ophthalmic emulsion INSTILL ONE DROP IN BOTH  EYES TWO TIMES DAILY 180 each 0  . traMADol (ULTRAM) 50 MG tablet TAKE 1 TABLET BY MOUTH 3  TIMES DAILY 60 tablet 0   No current facility-administered medications for this visit.     Musculoskeletal: Strength & Muscle Tone: within normal limits Gait & Station: normal Patient leans: N/A  Psychiatric Specialty Exam: Review of Systems  Constitutional: Positive for fatigue.  Musculoskeletal: Positive for arthralgias, joint swelling and myalgias.  All other systems reviewed and are negative.   There were no vitals taken for this visit.There is no height or weight on file to calculate BMI.  General Appearance: NA  Eye Contact:  NA  Speech:  Clear and Coherent  Volume:  Normal  Mood:  Euthymic  Affect:  NA  Thought Process:  Goal Directed  Orientation:  Full (Time, Place, and Person)  Thought Content: Rumination   Suicidal Thoughts:  No  Homicidal Thoughts:  No  Memory:  Immediate;   Good Recent;   Good Remote;   Good  Judgement:  Good  Insight:  Good  Psychomotor Activity:  Decreased  Concentration:  Concentration: Good and Attention Span: Good  Recall:  Good  Fund of Knowledge: Good  Language: Good  Akathisia:  No  Handed:  Right  AIMS (if indicated): not done  Assets:  Communication Skills Desire for Improvement Resilience Social Support Talents/Skills  ADL's:  Intact  Cognition: WNL  Sleep:  Good   Screenings: GAD-7     Office Visit from 12/22/2018 in [place] at [facility]  Total GAD-7 Score  5    Mini-Mental     Clinical Support from 12/11/2017 in [place] at [place] from 12/09/2016 in [place] at [place] from 11/30/2015 in [place] at [facility]  Total Score (max 30 points )  20  19  18     PHQ2-9      Office Visit from 12/22/2018 in [place] at [place] from 12/21/2018 in [place] at [place] from 12/11/2017 in [place] at [place] from 12/09/2016 in [place] at [place] from 11/30/2015 in [place] at [facility] Total Score  0  1  0  2  4  PHQ-9 Total Score  4  1  0  4  13       Assessment and Plan: This patient is a 69 year old female with a history of depression anxiety.  Despite all the recent stressors and losses she is doing fairly well.  She will continue Lexapro 20 mg twice daily for depression and Xanax 0.5 mg twice daily as needed for anxiety.  She has amitriptyline 25 mg at bedtime for sleep to use as needed.  She will return to see me  in 3 months   [name], MD 05/26/2019, 8:59 AM

## 2019-05-27 ENCOUNTER — Other Ambulatory Visit: Payer: Self-pay

## 2019-05-27 ENCOUNTER — Ambulatory Visit: Payer: Medicare Other

## 2019-05-27 DIAGNOSIS — M8589 Other specified disorders of bone density and structure, multiple sites: Secondary | ICD-10-CM

## 2019-05-27 DIAGNOSIS — F5104 Psychophysiologic insomnia: Secondary | ICD-10-CM

## 2019-05-27 DIAGNOSIS — F411 Generalized anxiety disorder: Secondary | ICD-10-CM

## 2019-05-27 DIAGNOSIS — E7800 Pure hypercholesterolemia, unspecified: Secondary | ICD-10-CM

## 2019-05-27 DIAGNOSIS — G6289 Other specified polyneuropathies: Secondary | ICD-10-CM

## 2019-05-27 DIAGNOSIS — M79601 Pain in right arm: Secondary | ICD-10-CM

## 2019-05-27 DIAGNOSIS — M1612 Unilateral primary osteoarthritis, left hip: Secondary | ICD-10-CM

## 2019-05-27 DIAGNOSIS — M797 Fibromyalgia: Secondary | ICD-10-CM

## 2019-05-27 DIAGNOSIS — F331 Major depressive disorder, recurrent, moderate: Secondary | ICD-10-CM

## 2019-05-27 DIAGNOSIS — H409 Unspecified glaucoma: Secondary | ICD-10-CM

## 2019-05-27 DIAGNOSIS — G459 Transient cerebral ischemic attack, unspecified: Secondary | ICD-10-CM

## 2019-05-27 DIAGNOSIS — M3500 Sicca syndrome, unspecified: Secondary | ICD-10-CM

## 2019-05-27 DIAGNOSIS — I10 Essential (primary) hypertension: Secondary | ICD-10-CM

## 2019-05-27 DIAGNOSIS — I5032 Chronic diastolic (congestive) heart failure: Secondary | ICD-10-CM

## 2019-05-27 DIAGNOSIS — Z86711 Personal history of pulmonary embolism: Secondary | ICD-10-CM

## 2019-05-27 DIAGNOSIS — N1831 Chronic kidney disease, stage 3a: Secondary | ICD-10-CM

## 2019-05-28 NOTE — Patient Instructions (Addendum)
Dear Alexandria Leach,  It was a pleasure meeting you during our initial appointment on May 27, 2019. Below is a summary of the goals we discussed and components of chronic care management. Please contact me anytime with questions or concerns.   Visit Information  Goals Addressed            This Visit's Progress   . Pharmacy Care Plan       Current Barriers:  . Chronic Disease Management support, education, and care coordination needs related to hypertension, heart failure, peripheral neuropathy, osteoarthritis, osteopenia, CKD, hypercholesterolemia, generalized anxiety disorder, depression, chronic insomnia, Sjogren's Syndrome, glaucoma, fibromyalgia, history of pulmonary embolism, arm pain  Pharmacist Clinical Goal(s):  Marland Kitchen Maintain blood pressure within goal of less than 140/90 mmHg. Continue current medications. Continue to check blood pressure daily. Will consult with your physician for dose adjustments.  . Improve back pain. May try lidocaine  4% patches over the counter. Avoid water and heat application while wearing patch. Read instructions carefully.  . Improve dry eyes secondary to Sjogren's Syndrome. Try increasing Systane eye drops up to twice daily.   Interventions: . Comprehensive medication review performed.  Patient Self Care Activities:  . Self administers medications as prescribed  Initial goal documentation       Alexandria Leach was given information about Chronic Care Management services today including:  1. CCM service includes personalized support from designated clinical staff supervised by her physician, including individualized plan of care and coordination with other care providers 2. 24/7 contact phone numbers for assistance for urgent and routine care needs. 3. Standard insurance, coinsurance, copays and deductibles apply for chronic care management only during months in which we provide at least 20 minutes of these services. Most insurances cover these  services at 100%, however patients may be responsible for any copay, coinsurance and/or deductible if applicable. This service may help you avoid the need for more expensive face-to-face services. 4. Only one practitioner may furnish and bill the service in a calendar month. 5. The patient may stop CCM services at any time (effective at the end of the month) by phone call to the office staff.  Patient agreed to services and verbal consent obtained.   Print copy of patient instructions provided.  Telephone follow up appointment with pharmacy team member scheduled for: 06/02/19 9:00 AM, telephone for blood pressure assessment   Debbora Dus, PharmD Clinical Pharmacist [facility] [phone]   Managing Your Hypertension Hypertension is commonly called high blood pressure. This is when the force of your blood pressing against the walls of your arteries is too strong. Arteries are blood vessels that carry blood from your heart throughout your body. Hypertension forces the heart to work harder to pump blood, and may cause the arteries to become narrow or stiff. Having untreated or uncontrolled hypertension can cause heart attack, stroke, kidney disease, and other problems. What are blood pressure readings? A blood pressure reading consists of a higher number over a lower number. Ideally, your blood pressure should be below 120/80. The first ("top") number is called the systolic pressure. It is a measure of the pressure in your arteries as your heart beats. The second ("bottom") number is called the diastolic pressure. It is a measure of the pressure in your arteries as the heart relaxes. What does my blood pressure reading mean? Blood pressure is classified into four stages. Based on your blood pressure reading, your health care provider may use the following stages to  determine what type of treatment you need, if any. Systolic pressure and diastolic pressure are measured in  a unit called mm Hg. Normal  Systolic pressure: below 123456.  Diastolic pressure: below 80. Elevated  Systolic pressure: [id number].  Diastolic pressure: below 80. Hypertension stage 1  Systolic pressure: 0000000.  Diastolic pressure: [id number]. Hypertension stage 2  Systolic pressure: [id number] or above.  Diastolic pressure: 90 or above. What health risks are associated with hypertension? Managing your hypertension is an important responsibility. Uncontrolled hypertension can lead to:  A heart attack.  A stroke.  A weakened blood vessel (aneurysm).  Heart failure.  Kidney damage.  Eye damage.  Metabolic syndrome.  Memory and concentration problems. What changes can I make to manage my hypertension? Hypertension can be managed by making lifestyle changes and possibly by taking medicines. Your health care provider will help you make a plan to bring your blood pressure within a normal range. Eating and drinking   Eat a diet that is high in fiber and potassium, and low in salt (sodium), added sugar, and fat. An example eating plan is called the DASH (Dietary Approaches to Stop Hypertension) diet. To eat this way: ? Eat plenty of fresh fruits and vegetables. Try to fill half of your plate at each meal with fruits and vegetables. ? Eat whole grains, such as whole wheat pasta, brown rice, or whole grain bread. Fill about one quarter of your plate with whole grains. ? Eat low-fat diary products. ? Avoid fatty cuts of meat, processed or cured meats, and poultry with skin. Fill about one quarter of your plate with lean proteins such as fish, chicken without skin, beans, eggs, and tofu. ? Avoid premade and processed foods. These tend to be higher in sodium, added sugar, and fat.  Reduce your daily sodium intake. Most people with hypertension should eat less than 1,500 mg of sodium a day.  Limit alcohol intake to no more than 1 drink a day for nonpregnant women and 2 drinks a day for men.  One drink equals 12 oz of beer, 5 oz of wine, or 1 oz of hard liquor. Lifestyle  Work with your health care provider to maintain a healthy body weight, or to lose weight. Ask what an ideal weight is for you.  Get at least 30 minutes of exercise that causes your heart to beat faster (aerobic exercise) most days of the week. Activities may include walking, swimming, or biking.  Include exercise to strengthen your muscles (resistance exercise), such as weight lifting, as part of your weekly exercise routine. Try to do these types of exercises for 30 minutes at least 3 days a week.  Do not use any products that contain nicotine or tobacco, such as cigarettes and e-cigarettes. If you need help quitting, ask your health care provider.  Control any long-term (chronic) conditions you have, such as high cholesterol or diabetes. Monitoring  Monitor your blood pressure at home as told by your health care provider. Your personal target blood pressure may vary depending on your medical conditions, your age, and other factors.  Have your blood pressure checked regularly, as often as told by your health care provider. Working with your health care provider  Review all the medicines you take with your health care provider because there may be side effects or interactions.  Talk with your health care provider about your diet, exercise habits, and other lifestyle factors that may be contributing to hypertension.  Visit your health care provider regularly. Your  health care provider can help you create and adjust your plan for managing hypertension. Will I need medicine to control my blood pressure? Your health care provider may prescribe medicine if lifestyle changes are not enough to get your blood pressure under control, and if:  Your systolic blood pressure is 130 or higher.  Your diastolic blood pressure is 80 or higher. Take medicines only as told by your health care provider. Follow the directions  carefully. Blood pressure medicines must be taken as prescribed. The medicine does not work as well when you skip doses. Skipping doses also puts you at risk for problems. Contact a health care provider if:  You think you are having a reaction to medicines you have taken.  You have repeated (recurrent) headaches.  You feel dizzy.  You have swelling in your ankles.  You have trouble with your vision. Get help right away if:  You develop a severe headache or confusion.  You have unusual weakness or numbness, or you feel faint.  You have severe pain in your chest or abdomen.  You vomit repeatedly.  You have trouble breathing. Summary  Hypertension is when the force of blood pumping through your arteries is too strong. If this condition is not controlled, it may put you at risk for serious complications.  Your personal target blood pressure may vary depending on your medical conditions, your age, and other factors. For most people, a normal blood pressure is less than 120/80.  Hypertension is managed by lifestyle changes, medicines, or both. Lifestyle changes include weight loss, eating a healthy, low-sodium diet, exercising more, and limiting alcohol. This information is not intended to replace advice given to you by your health care provider. Make sure you discuss any questions you have with your health care provider. Document Revised: 06/26/2018 Document Reviewed: 01/31/2016 Elsevier Patient Education  [place].

## 2019-05-30 ENCOUNTER — Telehealth: Payer: Self-pay | Admitting: Family Medicine

## 2019-05-30 NOTE — Telephone Encounter (Signed)
See recent chronic care management visit - persistently elevated bp readings How is weight? Any leg swelling? I would like her to try lasix 20mg  daily for 3 days. I would also like her to increase metoprolol to 75mg  twice daily - new dose at pharmacy. She may take 1.5 tablets twice daily of the 25mg  until she runs out. Watch for bradycardia.  If persistently elevated, may consider ACEI/ARB.

## 2019-05-31 MED ORDER — METOPROLOL TARTRATE 75 MG PO TABS: 75.0000 mg | ORAL_TABLET | Freq: Two times a day (BID) | ORAL | 3 refills | Status: DC

## 2019-05-31 NOTE — Telephone Encounter (Signed)
Spoke with pt asking about wt changes.  Pt states wt is staying about the same, 190-ish.  C/o bilateral leg swelling off and on.  I relayed Dr. Synthia Innocent message concerning meds.  Pt verbalizes understanding.  FYI to Dr. Darnell Level.

## 2019-06-02 ENCOUNTER — Other Ambulatory Visit: Payer: Self-pay

## 2019-06-02 ENCOUNTER — Ambulatory Visit: Payer: Medicare Other

## 2019-06-02 ENCOUNTER — Telehealth: Payer: Self-pay | Admitting: Family Medicine

## 2019-06-02 NOTE — Patient Instructions (Signed)
June 02, 2019  Dear Alexandria Leach,  Below is a summary of the changes we discussed on June 02, 2019. Please contact me anytime with questions or concerns.   Visit Information  Goals Addressed            This Visit's Progress   . Pharmacy Care Plan       Current Barriers:  . Chronic Disease Management support, education, and care coordination needs related to hypertension, heart failure, peripheral neuropathy, osteoarthritis, osteopenia, CKD, hypercholesterolemia, generalized anxiety disorder, depression, chronic insomnia, Sjogren's Syndrome, glaucoma, fibromyalgia, history of pulmonary embolism, arm pain  Pharmacist Clinical Goal(s):  Marland Kitchen Maintain blood pressure within goal of less than 140/90 mmHg. Continue increased dose of metoprolol 1.5 tablets (75 mg) BID. Continue to check blood pressure daily. Will consult with PCP regarding swelling and Lasix.   Interventions: . Comprehensive medication review performed.  Patient Self Care Activities:  . Self administers medications as prescribed  Initial goal documentation      The patient verbalized understanding of instructions provided today and agreed to receive a mailed copy of patient instruction and/or educational materials. Telephone follow up appointment with pharmacy team member scheduled for: June 18, 2019 at 1:00 PM for blood pressure log review  Debbora Dus, PharmD Clinical Pharmacist [facility] [phone]

## 2019-06-02 NOTE — Chronic Care Management (AMB) (Signed)
Chronic Care Management Pharmacy  Name: Alexandria Leach  MRN: [id number] DOB: 10-Jun-1950  Chief Complaint/ HPI  Alexandria Leach,  69 y.o., female presents for their Follow-Up CCM visit with the clinical pharmacist via telephone.  PCP : Ria Bush, MD  Their chronic conditions addressed today include: hypertension, heart failure, peripheral neuropathy, osteoarthritis, Sjogren's Syndrome  Recent med changes: Per PCP, following last CCM visit: I would like her to try lasix 20mg  daily for 3 days. I would also like her to increase metoprolol to 75mg  twice daily - new dose at pharmacy. She may take 1.5 tablets twice daily of the 25mg  until she runs out. Watch for bradycardia.    Allergies  Allergen Reactions  . Cymbalta [Duloxetine Hcl] Other (See Comments)    tachycardia  . Statins Nausea Only and Other (See Comments)    Muscle cramps also  . Sulfa Antibiotics Nausea And Vomiting   Current Outpatient Medications on File Prior to Visit  Medication Sig Dispense Refill  . ALPRAZolam (XANAX) 0.5 MG tablet Take 1 tablet (0.5 mg total) by mouth 2 (two) times daily. 180 tablet 2  . amitriptyline (ELAVIL) 25 MG tablet Take 1 tablet (25 mg total) by mouth at bedtime. 90 tablet 2  . antiseptic oral rinse (BIOTENE) LIQD 15 mLs by Mouth Rinse route at bedtime.     Marland Kitchen aspirin 81 MG tablet Take 1 tablet (81 mg total) by mouth daily.    . cholecalciferol (VITAMIN D) 1000 UNITS tablet Take 1,000 Units by mouth daily.     Marland Kitchen Co-Enzyme Q-10 100 MG CAPS Take 1 capsule (100 mg total) by mouth daily.  0  . diclofenac sodium (VOLTAREN) 1 % GEL Apply 1 application topically 2 (two) times daily as needed (pain).    Marland Kitchen diltiazem (CARDIZEM CD) 360 MG 24 hr capsule Take 1 capsule (360 mg total) by mouth daily. 90 capsule 3  . escitalopram (LEXAPRO) 20 MG tablet Take 1 tablet (20 mg total) by mouth 2 (two) times daily. 180 tablet 2  . ezetimibe (ZETIA) 10 MG tablet TAKE 1 TABLET BY MOUTH AT   BEDTIME 90 tablet 3  . famotidine (PEPCID) 20 MG tablet TAKE 1 TABLET BY MOUTH TWO  TIMES DAILY 180 tablet 3  . furosemide (LASIX) 20 MG tablet TAKE 1 TABLET BY MOUTH  DAILY AS NEEDED FOR EDEMA 90 tablet 1  . gabapentin (NEURONTIN) 300 MG capsule TAKE 2 CAPSULES BY MOUTH  EVERY MORNING, 1 CAPSULE  EVERY AFTERNOON AND 2  CAPSULES AT BEDTIME. 450 capsule 3  . hydroxychloroquine (PLAQUENIL) 200 MG tablet Take 1 tablet (200 mg total) by mouth 2 (two) times daily. 180 tablet 0  . lovastatin (MEVACOR) 10 MG tablet TAKE 1 TABLET BY MOUTH  EVERY MONDAY, WEDNESDAY,  AND FRIDAY 39 tablet 3  . metoprolol tartrate 75 MG TABS Take 75 mg by mouth 2 (two) times daily. 180 tablet 3  . Polyethyl Glycol-Propyl Glycol (SYSTANE) 0.4-0.3 % GEL Place 1 drop into both eyes 2 (two) times daily.     . RESTASIS 0.05 % ophthalmic emulsion INSTILL ONE DROP IN BOTH  EYES TWO TIMES DAILY 180 each 0  . traMADol (ULTRAM) 50 MG tablet TAKE 1 TABLET BY MOUTH 3  TIMES DAILY 60 tablet 0   No current facility-administered medications on file prior to visit.   Current Diagnosis/Assessment: Heart Failure   Type: Diastolic Last ejection fraction: 05/2017 (65-70%) NYHA Class: II (slight limitation of activity) - can do most normal activities,  unable to walk up many steps without palpitations or SOB  AHA HF Stage: C (Heart disease and symptoms present)  Patient has failed these meds in past: none Patient is currently controlled on the following medications:   Furosemide 20 mg - 1 tablet daily PRN  Metoprolol tartrate 50 mg - 1.5 tablets (75 mg) BID  We discussed: took furosemide the past 2 days per PCP, notes no change in weight or swelling despite furosemide  Reports swelling in her feet and ankles started about a month ago, drinking a lot of water and admits to having more potato chips lately. Weighs daily first thing in the morning, aware of 3 lb/5 lb limits, reports SOB is stable --> we discussed replacing potato chips with  healthy snack, such as yogurt which she enjoys, continue to watch salt intake, plans to resume furosemide PRN after today  Plan: Continue current medications; Limit salt intake. Try replacing potato chips with yogurt.   Hypertension   CMP Latest Ref Rng & Units 04/20/2019 01/21/2019 12/15/2018  Glucose 65 - 99 mg/dL 132(H) 88 84  BUN 7 - 25 mg/dL 10 9 7   Creatinine 0.50 - 0.99 mg/dL 1.28(H) 1.27(H) 1.20  Sodium 135 - 146 mmol/L 139 142 137  Potassium 3.5 - 5.3 mmol/L 4.2 4.3 4.1  Chloride 98 - 110 mmol/L 109 108 105  CO2 20 - 32 mmol/L 21 26 24   Calcium 8.6 - 10.4 mg/dL 9.7 9.8 9.5  Total Protein 6.1 - 8.1 g/dL 7.1 7.2 -  Total Bilirubin 0.2 - 1.2 mg/dL 0.4 0.5 -  Alkaline Phos 39 - 117 U/L - - -  AST 10 - 35 U/L 26 22 -  ALT 6 - 29 U/L 15 14 -   Office blood pressures are  BP Readings from Last 3 Encounters:  04/27/19 (!) 143/84  01/25/19 (!) 178/98  12/22/18 (!) 144/82   UACR: 1.2 BP goal < 140/90 mmHg Patient has failed these meds in the past: denies any failed therapies  Patient checks BP at home daily - before breakfast/before medications  Patient home BP readings are ranging:  - Day 1 of dose increase, 3/15: 147/70, 70 - Day 2 of dose increase, 3/16: 137/71, 63  Patient is currently uncontrolled on the following medications:   Diltiazem 360 mg 24 hr - 1 capsule daily (palpitations - AM)  Metoprolol tartrate 50 mg - 1.5 tablets (75 mg) BID   We discussed: increase metoprolol as instructed 2 days ago, doing well, denies fatigue or dizziness   Plan: Continue current medications; Follow up in 2 weeks   Osteoarthritis/Neuropathic Pain/DDD   Patient has failed these meds in past: Tylenol - not very effective  Patient is currently uncontrolled on the following medications:   Gabapentin 300 mg - 2 caps AM, 1 cap noon, 2 caps PM  Tramadol 50 mg - 1 tablet TID PRN  Diclofenac 1% gel - Apply daily QID   Amitriptyline 25 mg - 1 tablet daily at bedtime   Lidocaine 4%  patch (OTC) - Apply 1 patch daily to affected area  We discussed:  Nerve pain - gabapentin helps a lot; Osteoarthritis - takes tramadol only if pain is very severe, about once a week, applies diclofenac gel to shoulder 3-4 times a day - works well; pt tried lidocaine patches since previous visit and reports much improvement in back pain, able to stand longer without pain  Plan: Continue current medications  Sjogren's Syndrome    Patient is currently controlled on  the following medications:   Restasis 0.05% - 1 drop in both eyes BID   Systane 0.4-0.3% gel - 1 drop in both eyes BID (using maybe once a day)  Hydroxychloroquine 200 mg - 1 tablet BID  Biotene spray for dry mouth (uses PRN throughout the day)  Biotene rinse (uses after brushing)   We discussed: Pt increase Systane use to TID and her dry eyes are improving; dry mouth remains an issue; previous visit reported rheumatology suggested maybe d/c Plaquenil as not resolving dryness, but pt remains unsure about discontinuation, would like to discuss at next visit with speciality   Plan: Continue current medications   Medication Management  Pharmacy/Benefits: [facility], [facility]   Adherence: refills pillbox every Saturday   Affordability: no concerns   Vaccines: Recommend Shingrix, Tdap, influenza   CCM Follow Up: 06/18/19 at 1:00 PM (telephone) for BP log  Alexandria Leach, PharmD Clinical Pharmacist [facility] [phone]  ---------- Dr. Danise Mina,  Pt's blood pressure is looking a lot better the past 2 days with metoprolol increase. She is taking 1.5 tabs (75 mg) BID as instructed. BP yesterday 137/71, heart rate 63 and she denies fatigue/dizziness. She continues to have swelling in feet and ankles (reports it started about a month ago) despite taking furosemide the past 2 days and today. She reports increased salt intake and plans to cut out potato chips. Denies any weight changes or  shortness of breath. Would you like to make any other changes to Lasix or just limit salt and resume PRN dosing?   Alexandria Leach

## 2019-06-02 NOTE — Telephone Encounter (Signed)
See recent CCM note  Agree with continuing working towards limiting salt (potato chips), continue higher metoprolol dose, update [place] with leg swelling in 1 week.

## 2019-06-18 ENCOUNTER — Telehealth: Payer: Self-pay

## 2019-06-18 ENCOUNTER — Ambulatory Visit: Payer: Medicare Other

## 2019-06-18 ENCOUNTER — Other Ambulatory Visit: Payer: Self-pay

## 2019-06-18 DIAGNOSIS — I5032 Chronic diastolic (congestive) heart failure: Secondary | ICD-10-CM

## 2019-06-18 DIAGNOSIS — I10 Essential (primary) hypertension: Secondary | ICD-10-CM

## 2019-06-18 NOTE — Patient Instructions (Signed)
June 18, 2019  Dear [name] Hyer,  Below is a summary of the goals we discussed at our telephone visit on June 18, 2019. Please contact me anytime with questions or concerns.   Visit Information  Goals Addressed            This Visit's Progress   . Pharmacy Care Plan       Current Barriers:  . Chronic Disease Management support, education, and care coordination needs related to hypertension, heart failure, peripheral neuropathy, osteoarthritis, osteopenia, CKD, hypercholesterolemia, generalized anxiety disorder, depression, chronic insomnia, Sjogren's Syndrome, glaucoma, fibromyalgia, history of pulmonary embolism, arm pain  Pharmacist Clinical Goal(s):  Marland Kitchen Maintain blood pressure within goal of less than 140/90 mmHg. Continue increased dose of metoprolol 1.5 tablets (75 mg) BID. A new prescription will be sent to your pharmacy. May begin checking blood pressure less frequently, 1-2 times a week.  . Continue to check weight daily in the morning and limit table salt. Take Lasix as needed, 1 tablet daily with weight gain of 3 pounds over night or 5 pounds in 1 week. Current weight reported is 193 lbs.  . Remain up to date on vaccinations. Recommend 2-dose series of shingles vaccine (Shingrix) and the tetanus vaccine (Tdap) from your pharmacy.   Interventions: . Comprehensive medication review performed.  Patient Self Care Activities:  . Self administers medications as prescribed  Please see past updates related to this goal by clicking on the "Past Updates" button in the selected goal       The patient verbalized understanding of instructions provided today and agreed to receive a mailed copy of patient instruction and/or educational materials. Telephone follow up appointment with pharmacy team member scheduled for: September 17, 2019 at 1:00 PM (telephone)  Debbora Dus, PharmD Clinical Pharmacist [facility] at [facility] [phone]  Zoster Vaccine,  Recombinant injection What is this medicine? ZOSTER VACCINE (ZOS ter vak SEEN) is used to prevent shingles in adults 67 years old and over. This vaccine is not used to treat shingles or nerve pain from shingles. This medicine may be used for other purposes; ask your health care provider or pharmacist if you have questions. COMMON BRAND NAME(S): [facility] What should I tell my health care provider before I take this medicine? They need to know if you have any of these conditions:  blood disorders or disease  cancer like leukemia or lymphoma  immune system problems or therapy  an unusual or allergic reaction to vaccines, other medications, foods, dyes, or preservatives  pregnant or trying to get pregnant  breast-feeding How should I use this medicine? This vaccine is for injection in a muscle. It is given by a health care professional. Talk to your pediatrician regarding the use of this medicine in children. This medicine is not approved for use in children. Overdosage: If you think you have taken too much of this medicine contact a poison control center or emergency room at once. NOTE: This medicine is only for you. Do not share this medicine with others. What if I miss a dose? Keep appointments for follow-up (booster) doses as directed. It is important not to miss your dose. Call your doctor or health care professional if you are unable to keep an appointment. What may interact with this medicine?  medicines that suppress your immune system  medicines to treat cancer  steroid medicines like prednisone or cortisone This list may not describe all possible interactions. Give your health care provider a list of all  the medicines, herbs, non-prescription drugs, or dietary supplements you use. Also tell them if you smoke, drink alcohol, or use illegal drugs. Some items may interact with your medicine. What should I watch for while using this medicine? Visit your doctor for regular check  ups. This vaccine, like all vaccines, may not fully protect everyone. What side effects may I notice from receiving this medicine? Side effects that you should report to your doctor or health care professional as soon as possible:  allergic reactions like skin rash, itching or hives, swelling of the face, lips, or tongue  breathing problems Side effects that usually do not require medical attention (report these to your doctor or health care professional if they continue or are bothersome):  chills  headache  fever  nausea, vomiting  redness, warmth, pain, swelling or itching at site where injected  tiredness This list may not describe all possible side effects. Call your doctor for medical advice about side effects. You may report side effects to FDA at 1-800-FDA-1088. Where should I keep my medicine? This vaccine is only given in a clinic, pharmacy, doctor's office, or other health care setting and will not be stored at home. NOTE: This sheet is a summary. It may not cover all possible information. If you have questions about this medicine, talk to your doctor, pharmacist, or health care provider.  2020 Elsevier/Gold Standard (2016-10-14 13:20:30)

## 2019-06-18 NOTE — Telephone Encounter (Signed)
Metoprolol tartrate 75 mg is not covered by Ms. Grimsley's insurance. She is still taking 50 mg - 1 and 1/2 tablet BID from her current supply and is fine with cutting in half. Can we send a new prescription for the 50 mg - one and one-half tablet twice daily to [facility]?  Thanks!  Debbora Dus, PharmD Clinical Pharmacist [facility] at [facility] [phone]

## 2019-06-18 NOTE — Chronic Care Management (AMB) (Signed)
Chronic Care Management Pharmacy  Name: Alexandria Leach  MRN: [id number] DOB: [birth year]  Chief Complaint/ HPI  Alexandria Leach,  69 y.o., female presents for their Follow-Up CCM visit with the clinical pharmacist via telephone.  PCP : Ria Bush, MD  Their chronic conditions addressed today include: hypertension, heart failure  Recent med changes: Per PCP, following last CCM visit: okay to return to PRN Lasix, watch sodium, continue metoprolol, update on swelling in 1 week  Patient concern: Per insurance metoprolol tartrate 75 mg is not covered, pt is still taking 50 mg - 1 and 1/2 tablet BID, she is fine with cutting in half, but will need a new rx for the 1 and 1/2 tablets BID  Allergies  Allergen Reactions  . Cymbalta [Duloxetine Hcl] Other (See Comments)    tachycardia  . Statins Nausea Only and Other (See Comments)    Muscle cramps also  . Sulfa Antibiotics Nausea And Vomiting   Current Outpatient Medications on File Prior to Visit  Medication Sig Dispense Refill  . ALPRAZolam (XANAX) 0.5 MG tablet Take 1 tablet (0.5 mg total) by mouth 2 (two) times daily. 180 tablet 2  . amitriptyline (ELAVIL) 25 MG tablet Take 1 tablet (25 mg total) by mouth at bedtime. 90 tablet 2  . antiseptic oral rinse (BIOTENE) LIQD 15 mLs by Mouth Rinse route at bedtime.     Marland Kitchen aspirin 81 MG tablet Take 1 tablet (81 mg total) by mouth daily.    . cholecalciferol (VITAMIN D) 1000 UNITS tablet Take 1,000 Units by mouth daily.     Marland Kitchen Co-Enzyme Q-10 100 MG CAPS Take 1 capsule (100 mg total) by mouth daily.  0  . diclofenac sodium (VOLTAREN) 1 % GEL Apply 1 application topically 2 (two) times daily as needed (pain).    Marland Kitchen diltiazem (CARDIZEM CD) 360 MG 24 hr capsule Take 1 capsule (360 mg total) by mouth daily. 90 capsule 3  . escitalopram (LEXAPRO) 20 MG tablet Take 1 tablet (20 mg total) by mouth 2 (two) times daily. 180 tablet 2  . ezetimibe (ZETIA) 10 MG tablet TAKE 1 TABLET BY MOUTH  AT  BEDTIME 90 tablet 3  . famotidine (PEPCID) 20 MG tablet TAKE 1 TABLET BY MOUTH TWO  TIMES DAILY 180 tablet 3  . furosemide (LASIX) 20 MG tablet TAKE 1 TABLET BY MOUTH  DAILY AS NEEDED FOR EDEMA 90 tablet 1  . gabapentin (NEURONTIN) 300 MG capsule TAKE 2 CAPSULES BY MOUTH  EVERY MORNING, 1 CAPSULE  EVERY AFTERNOON AND 2  CAPSULES AT BEDTIME. 450 capsule 3  . hydroxychloroquine (PLAQUENIL) 200 MG tablet Take 1 tablet (200 mg total) by mouth 2 (two) times daily. 180 tablet 0  . lovastatin (MEVACOR) 10 MG tablet TAKE 1 TABLET BY MOUTH  EVERY MONDAY, WEDNESDAY,  AND FRIDAY 39 tablet 3  . metoprolol tartrate 75 MG TABS Take 75 mg by mouth 2 (two) times daily. 180 tablet 3  . Polyethyl Glycol-Propyl Glycol (SYSTANE) 0.4-0.3 % GEL Place 1 drop into both eyes 2 (two) times daily.     . RESTASIS 0.05 % ophthalmic emulsion INSTILL ONE DROP IN BOTH  EYES TWO TIMES DAILY 180 each 0  . traMADol (ULTRAM) 50 MG tablet TAKE 1 TABLET BY MOUTH 3  TIMES DAILY 60 tablet 0   No current facility-administered medications on file prior to visit.   Current Diagnosis/Assessment: Goals    . Increase physical activity     Starting 12/11/2017, I will continue  to walk at least 60 min day/4 days per week.     . Patient Stated     12/21/2018, Patient wants to improve her ability to walk better and increase her mobility.     . Pharmacy Care Plan     Current Barriers:  . Chronic Disease Management support, education, and care coordination needs related to hypertension, heart failure, peripheral neuropathy, osteoarthritis, osteopenia, CKD, hypercholesterolemia, generalized anxiety disorder, depression, chronic insomnia, Sjogren's Syndrome, glaucoma, fibromyalgia, history of pulmonary embolism, arm pain  Pharmacist Clinical Goal(s):  Marland Kitchen Maintain blood pressure within goal of less than 140/90 mmHg. Continue increased dose of metoprolol 1.5 tablets (75 mg) BID. A new prescription will be sent to your pharmacy. May begin  checking blood pressure less frequently, 1-2 times a week.  . Continue to check weight daily in the morning and limit table salt. Take Lasix as needed, 1 tablet daily with weight gain of 3 pounds over night or 5 pounds in 1 week. Current weight reported is 193 lbs.  . Remain up to date on vaccinations. Recommend 2-dose series of shingles vaccine (Shingrix) and the tetanus vaccine (Tdap) from your pharmacy.   Interventions: . Comprehensive medication review performed.  Patient Self Care Activities:  . Self administers medications as prescribed  Please see past updates related to this goal by clicking on the "Past Updates" button in the selected goal       Heart Failure   Type: Diastolic Last ejection fraction: 05/2017 (65-70%) NYHA Class: II (slight limitation of activity) - can do most normal activities, unable to walk up many steps without palpitations or SOB  AHA HF Stage: C (Heart disease and symptoms present)  Patient has failed these meds in past: none Patient is currently controlled on the following medications:   Furosemide 20 mg - 1 tablet daily PRN  Metoprolol tartrate 50 mg - 1.5 tablets (75 mg) BID  We discussed: feet are still swollen, return to normal overnight, weight is stable: 193 lbs; she has continued furosemide daily although at last visit discussed returning to only as needed dosing, reports shortness of breath only with strenuous activities Sodium intake: switched potato chips for yogurt, eats frozen vegetables, occasionally will add table salt to food  Plan: Continue current medications and limit sodium. Take Lasix with weight gain of 3 pounds overnight or 5 pounds in 1 week. Discuss swelling with PCP appt on 06/22/19.   Hypertension   CMP Latest Ref Rng & Units 04/20/2019 01/21/2019 12/15/2018  Glucose 65 - 99 mg/dL 132(H) 88 84  BUN 7 - 25 mg/dL 10 9 7   Creatinine 0.50 - 0.99 mg/dL 1.28(H) 1.27(H) 1.20  Sodium 135 - 146 mmol/L 139 142 137  Potassium 3.5 - 5.3  mmol/L 4.2 4.3 4.1  Chloride 98 - 110 mmol/L 109 108 105  CO2 20 - 32 mmol/L 21 26 24   Calcium 8.6 - 10.4 mg/dL 9.7 9.8 9.5  Total Protein 6.1 - 8.1 g/dL 7.1 7.2 -  Total Bilirubin 0.2 - 1.2 mg/dL 0.4 0.5 -  Alkaline Phos 39 - 117 U/L - - -  AST 10 - 35 U/L 26 22 -  ALT 6 - 29 U/L 15 14 -   Office blood pressures are  BP Readings from Last 3 Encounters:  04/27/19 (!) 143/84  01/25/19 (!) 178/98  12/22/18 (!) 144/82   UACR: 1.2 BP goal < 140/90 mmHg Patient has failed these meds in the past: denies any failed therapies  Patient checks BP at home:  twice daily - before breakfast/medications, before bedtime  Patient home BP readings are ranging: 138/64, 140/80, 132/63; HR 65-70  Patient is currently controlled on the following medications:   Diltiazem 360 mg 24 hr - 1 capsule daily (palpitations - AM)  Metoprolol tartrate 50 mg - 1.5 tablets (75 mg) BID   We discussed: confirmed still taking increased dose of metoprolol -1.5 tablets BID, reports blood pressure has improved with decreased sodium and is usually around 130s in both morning and afternoons  Plan: Continue current medications; Send new prescription to mail order at 75 mg dose is not covered by insurance.  Vaccines   Reviewed and discussed patient's vaccination history.    Immunization History  Administered Date(s) Administered  . Pneumococcal Conjugate-13 12/15/2017  . Pneumococcal Polysaccharide-23 09/15/2012, 12/22/2018    Plan: Recommended patient receive Shingrix, Tdap, and seasonal influenza vaccine.   Medication Management  Pharmacy/Benefits: [facility], [facility]   Adherence: refills pillbox every Saturday   Affordability: no concerns   Vaccines: Recommend Shingrix, Tdap, influenza   CCM Follow Up: 09/17/19 at 1:00 PM, telephone   Debbora Dus, PharmD Clinical Pharmacist [facility] at [facility] [phone]

## 2019-06-19 MED ORDER — METOPROLOL TARTRATE 50 MG PO TABS: 75.0000 mg | ORAL_TABLET | Freq: Two times a day (BID) | ORAL | 3 refills | Status: DC

## 2019-06-19 NOTE — Telephone Encounter (Signed)
New Rx sent. Thank you.

## 2019-06-22 ENCOUNTER — Encounter: Payer: Self-pay | Admitting: Family Medicine

## 2019-06-22 ENCOUNTER — Other Ambulatory Visit: Payer: Self-pay

## 2019-06-22 ENCOUNTER — Ambulatory Visit (INDEPENDENT_AMBULATORY_CARE_PROVIDER_SITE_OTHER): Payer: Medicare Other | Admitting: Family Medicine

## 2019-06-22 VITALS — BP 136/70 | HR 72 | Temp 97.5°F | Ht 61.0 in | Wt 191.0 lb

## 2019-06-22 DIAGNOSIS — I5032 Chronic diastolic (congestive) heart failure: Secondary | ICD-10-CM

## 2019-06-22 DIAGNOSIS — M797 Fibromyalgia: Secondary | ICD-10-CM | POA: Diagnosis not present

## 2019-06-22 DIAGNOSIS — M3500 Sicca syndrome, unspecified: Secondary | ICD-10-CM | POA: Diagnosis not present

## 2019-06-22 DIAGNOSIS — I10 Essential (primary) hypertension: Secondary | ICD-10-CM | POA: Diagnosis not present

## 2019-06-22 NOTE — Progress Notes (Signed)
This visit was conducted in person.  BP 136/70 (BP Location: Left Arm, Patient Position: Sitting, Cuff Size: Large)   Pulse 72   Temp (!) 97.5 F (36.4 C) (Temporal)   Ht 5\' 1"  (1.549 m)   Wt 191 lb (86.6 kg)   SpO2 95%   BMI 36.09 kg/m    CC: 6 mo f/u visit  Subjective:    Patient ID: Alexandria Leach, female    DOB: 1950/10/24, 69 y.o.   MRN: [id number]  HPI: Alexandria Leach is a 69 y.o. female presenting on 06/22/2019 for Follow-up (Here for 6 mo f/u.) and Form Completion (Needs disability parking placard form completed. )   Lost another brother 04/2019 - unclear cause.   Known Sjogren's syndrome followed by rheum. Having more trouble with dry eyes and dry mouth - affecting swallowing at times.   HTN - insurance would not cover metoprolol 75mg  dose - so using 50mg  1.5 tablets twice daily. Takes lasix 20mg  PRN - recently increased to daily due to leg swelling. Watches salt in diet.  HLD - continues zetia, lovastatin MWF.  CKD - stable period.  GERD - takes pepcid 20mg  bid.  FM - on gabapentin 600/300/600 daily and amitriptyline 25mg  at night time.   Takes xanax once daily Takes tramadol twice weekly.   1 fall in the past 6 months 04/2019 - stood on chair while cleaning oven, hit wrist and head but feels fully better.      Relevant past medical, surgical, family and social history reviewed and updated as indicated. Interim medical history since our last visit reviewed. Allergies and medications reviewed and updated. Outpatient Medications Prior to Visit  Medication Sig Dispense Refill  . ALPRAZolam (XANAX) 0.5 MG tablet Take 1 tablet (0.5 mg total) by mouth 2 (two) times daily. 180 tablet 2  . amitriptyline (ELAVIL) 25 MG tablet Take 1 tablet (25 mg total) by mouth at bedtime. 90 tablet 2  . antiseptic oral rinse (BIOTENE) LIQD 15 mLs by Mouth Rinse route at bedtime.     Marland Kitchen aspirin 81 MG tablet Take 1 tablet (81 mg total) by mouth daily.    . cholecalciferol  (VITAMIN D) 1000 UNITS tablet Take 1,000 Units by mouth daily.     Marland Kitchen Co-Enzyme Q-10 100 MG CAPS Take 1 capsule (100 mg total) by mouth daily.  0  . diclofenac sodium (VOLTAREN) 1 % GEL Apply 1 application topically 2 (two) times daily as needed (pain).    Marland Kitchen diltiazem (CARDIZEM CD) 360 MG 24 hr capsule Take 1 capsule (360 mg total) by mouth daily. 90 capsule 3  . escitalopram (LEXAPRO) 20 MG tablet Take 1 tablet (20 mg total) by mouth 2 (two) times daily. 180 tablet 2  . ezetimibe (ZETIA) 10 MG tablet TAKE 1 TABLET BY MOUTH AT  BEDTIME 90 tablet 3  . famotidine (PEPCID) 20 MG tablet TAKE 1 TABLET BY MOUTH TWO  TIMES DAILY 180 tablet 3  . furosemide (LASIX) 20 MG tablet TAKE 1 TABLET BY MOUTH  DAILY AS NEEDED FOR EDEMA 90 tablet 1  . gabapentin (NEURONTIN) 300 MG capsule TAKE 2 CAPSULES BY MOUTH  EVERY MORNING, 1 CAPSULE  EVERY AFTERNOON AND 2  CAPSULES AT BEDTIME. 450 capsule 3  . lovastatin (MEVACOR) 10 MG tablet TAKE 1 TABLET BY MOUTH  EVERY MONDAY, WEDNESDAY,  AND FRIDAY 39 tablet 3  . metoprolol tartrate (LOPRESSOR) 50 MG tablet Take 1.5 tablets (75 mg total) by mouth 2 (two) times daily. [place]  tablet 3  . Polyethyl Glycol-Propyl Glycol (SYSTANE) 0.4-0.3 % GEL Place 1 drop into both eyes 2 (two) times daily.     . RESTASIS 0.05 % ophthalmic emulsion INSTILL ONE DROP IN BOTH  EYES TWO TIMES DAILY 180 each 0  . traMADol (ULTRAM) 50 MG tablet TAKE 1 TABLET BY MOUTH 3  TIMES DAILY 60 tablet 0  . hydroxychloroquine (PLAQUENIL) 200 MG tablet Take 1 tablet (200 mg total) by mouth 2 (two) times daily. 180 tablet 0   No facility-administered medications prior to visit.     Per HPI unless specifically indicated in ROS section below Review of Systems Objective:    BP 136/70 (BP Location: Left Arm, Patient Position: Sitting, Cuff Size: Large)   Pulse 72   Temp (!) 97.5 F (36.4 C) (Temporal)   Ht 5\' 1"  (1.549 m)   Wt 191 lb (86.6 kg)   SpO2 95%   BMI 36.09 kg/m   Wt Readings from Last 3  Encounters:  06/22/19 191 lb (86.6 kg)  04/27/19 193 lb (87.5 kg)  01/25/19 188 lb (85.3 kg)    Physical Exam Vitals and nursing note reviewed.  Constitutional:      Appearance: Normal appearance. She is not ill-appearing.  Eyes:     Extraocular Movements: Extraocular movements intact.     Conjunctiva/sclera: Conjunctivae normal.     Pupils: Pupils are equal, round, and reactive to light.  Neck:     Thyroid: Thyromegaly present. No thyroid tenderness.     Comments:  Midline to R thyroid nodule Tender mildly swollen R submandibular gland Cardiovascular:     Rate and Rhythm: Normal rate and regular rhythm.     Pulses: Normal pulses.     Heart sounds: Normal heart sounds. No murmur.  Pulmonary:     Effort: Pulmonary effort is normal. No respiratory distress.     Breath sounds: Normal breath sounds. No wheezing, rhonchi or rales.  Musculoskeletal:     Cervical back: Normal range of motion and neck supple. Tenderness present. No rigidity.     Right lower leg: No edema.     Left lower leg: No edema.     Comments: 2+ DP bilaterally  Lymphadenopathy:     Cervical: No cervical adenopathy.  Neurological:     Mental Status: She is alert.  Psychiatric:        Mood and Affect: Mood normal.        Behavior: Behavior normal.       Results for orders placed or performed in visit on 04/20/19  COMPLETE METABOLIC PANEL WITH GFR  Result Value Ref Range   Glucose, Bld 132 (H) 65 - 99 mg/dL   BUN 10 7 - 25 mg/dL   Creat 1.28 (H) 0.50 - 0.99 mg/dL   GFR, Est Non African American 44 (L) > OR = 60 mL/min/1.53m2   GFR, Est African American 50 (L) > OR = 60 mL/min/1.67m2   BUN/Creatinine Ratio 8 6 - 22 (calc)   Sodium 139 135 - 146 mmol/L   Potassium 4.2 3.5 - 5.3 mmol/L   Chloride 109 98 - 110 mmol/L   CO2 21 20 - 32 mmol/L   Calcium 9.7 8.6 - 10.4 mg/dL   Total Protein 7.1 6.1 - 8.1 g/dL   Albumin 4.3 3.6 - 5.1 g/dL   Globulin 2.8 1.9 - 3.7 g/dL (calc)   AG Ratio 1.5 1.0 - 2.5 (calc)     Total Bilirubin 0.4 0.2 - 1.2 mg/dL   Alkaline  phosphatase (APISO) 57 37 - 153 U/L   AST 26 10 - 35 U/L   ALT 15 6 - 29 U/L  CBC with Differential/Platelet  Result Value Ref Range   WBC 4.1 3.8 - 10.8 Thousand/uL   RBC 4.43 3.80 - 5.10 Million/uL   Hemoglobin 12.1 11.7 - 15.5 g/dL   HCT 37.4 35.0 - 45.0 %   MCV 84.4 80.0 - 100.0 fL   MCH 27.3 27.0 - 33.0 pg   MCHC 32.4 32.0 - 36.0 g/dL   RDW 13.1 11.0 - 15.0 %   Platelets 241 140 - 400 Thousand/uL   MPV 10.5 7.5 - 12.5 fL   Neutro Abs 1,919 1,500 - 7,800 cells/uL   Lymphs Abs 1,661 850 - 3,900 cells/uL   Absolute Monocytes 373 200 - 950 cells/uL   Eosinophils Absolute 119 15 - 500 cells/uL   Basophils Absolute 29 0 - 200 cells/uL   Neutrophils Relative % 46.8 %   Total Lymphocyte 40.5 %   Monocytes Relative 9.1 %   Eosinophils Relative 2.9 %   Basophils Relative 0.7 %   Assessment & Plan:  This visit occurred during the SARS-CoV-2 public health emergency.  Safety protocols were in place, including screening questions prior to the visit, additional usage of staff PPE, and extensive cleaning of exam room while observing appropriate contact time as indicated for disinfecting solutions.   Problem List Items Addressed This Visit    Sjogren's syndrome ([place])    She will check with rheum about enlarged R submandibular salivary gland .      Fibromyalgia    Continues gabapentin 600/300/600 and amitriptyline 25mg  nightly.       Essential hypertension - Primary    Chronic, stable. Continue current regimen.       Diastolic CHF (HCC)    Seems euvolemic.  Discussed lasix use.  Stable weight.           No orders of the defined types were placed in this encounter.  No orders of the defined types were placed in this encounter.   Patient Instructions  Good to see you today. Continue current medicines. Ok to try 2 lasix at a time (40mg ), let me know if this is more effective than just 1 at a time. If we do switch, we will  check kidney function soon after starting higher dose.  Return as needed or in 6 months for physical/wellness visit.    Follow up plan: Return in about 6 months (around 12/22/2019) for annual exam, prior fasting for blood work, medicare wellness visit.  Ria Bush, MD

## 2019-06-22 NOTE — Patient Instructions (Addendum)
Good to see you today. Continue current medicines. Ok to try 2 lasix at a time (40mg ), let me know if this is more effective than just 1 at a time. If we do switch, we will check kidney function soon after starting higher dose.  Return as needed or in 6 months for physical/wellness visit.

## 2019-06-23 NOTE — Assessment & Plan Note (Signed)
She will check with rheum about enlarged R submandibular salivary gland .

## 2019-06-23 NOTE — Assessment & Plan Note (Signed)
Chronic, stable. Continue current regimen. 

## 2019-06-23 NOTE — Assessment & Plan Note (Signed)
Continues gabapentin 600/300/600 and amitriptyline 25mg  nightly.

## 2019-06-23 NOTE — Assessment & Plan Note (Addendum)
Seems euvolemic.  Discussed lasix use.  Stable weight.

## 2019-07-02 DIAGNOSIS — H10411 Chronic giant papillary conjunctivitis, right eye: Secondary | ICD-10-CM | POA: Diagnosis not present

## 2019-07-02 DIAGNOSIS — H1131 Conjunctival hemorrhage, right eye: Secondary | ICD-10-CM | POA: Diagnosis not present

## 2019-07-19 DIAGNOSIS — H16223 Keratoconjunctivitis sicca, not specified as Sjogren's, bilateral: Secondary | ICD-10-CM | POA: Diagnosis not present

## 2019-07-19 DIAGNOSIS — Z8371 Family history of colonic polyps: Secondary | ICD-10-CM | POA: Diagnosis not present

## 2019-07-19 DIAGNOSIS — K219 Gastro-esophageal reflux disease without esophagitis: Secondary | ICD-10-CM | POA: Diagnosis not present

## 2019-07-19 DIAGNOSIS — H52203 Unspecified astigmatism, bilateral: Secondary | ICD-10-CM | POA: Diagnosis not present

## 2019-07-19 DIAGNOSIS — Z8679 Personal history of other diseases of the circulatory system: Secondary | ICD-10-CM | POA: Diagnosis not present

## 2019-07-19 DIAGNOSIS — H10411 Chronic giant papillary conjunctivitis, right eye: Secondary | ICD-10-CM | POA: Diagnosis not present

## 2019-08-02 DIAGNOSIS — H16223 Keratoconjunctivitis sicca, not specified as Sjogren's, bilateral: Secondary | ICD-10-CM | POA: Diagnosis not present

## 2019-08-04 ENCOUNTER — Other Ambulatory Visit: Payer: Self-pay | Admitting: Family Medicine

## 2019-08-04 DIAGNOSIS — Z1231 Encounter for screening mammogram for malignant neoplasm of breast: Secondary | ICD-10-CM

## 2019-08-17 ENCOUNTER — Other Ambulatory Visit: Payer: Self-pay | Admitting: *Deleted

## 2019-08-17 ENCOUNTER — Telehealth: Payer: Self-pay | Admitting: Rheumatology

## 2019-08-17 DIAGNOSIS — Z5181 Encounter for therapeutic drug level monitoring: Secondary | ICD-10-CM

## 2019-08-17 DIAGNOSIS — G8929 Other chronic pain: Secondary | ICD-10-CM | POA: Diagnosis not present

## 2019-08-17 HISTORY — PX: COLONOSCOPY: SHX174

## 2019-08-17 HISTORY — PX: ESOPHAGOGASTRODUODENOSCOPY: SHX1529

## 2019-08-17 MED ORDER — TRAMADOL HCL 50 MG PO TABS: 50.0000 mg | ORAL_TABLET | Freq: Three times a day (TID) | ORAL | 0 refills | Status: DC

## 2019-08-17 NOTE — Telephone Encounter (Signed)
Last Visit: 04/27/2019 Next Visit: 09/24/2019 UDS: 01/21/2019 Narc Agreement: 01/21/2019  Last Fill: 01/21/2019  Attempted to contact the patient and left message for patient to call the office. Patient due to update UDS and narc agreement.   Okay to refill Tramadol?

## 2019-08-17 NOTE — Telephone Encounter (Signed)
Patient calling to let you know [facility] needs A PA for Tramadol. Please call to advise.

## 2019-08-19 ENCOUNTER — Other Ambulatory Visit: Payer: Self-pay | Admitting: Rheumatology

## 2019-08-19 DIAGNOSIS — M3501 Sicca syndrome with keratoconjunctivitis: Secondary | ICD-10-CM

## 2019-08-19 NOTE — Progress Notes (Signed)
UDS is negative for tramadol.  Please check if patient takes medication on a regular basis.  Also reviewed the refill history.

## 2019-08-20 LAB — DRUG MONITOR, PANEL 5, W/CONF, URINE
Alphahydroxyalprazolam: 645 ng/mL — ABNORMAL HIGH (ref <25)
Alphahydroxymidazolam: NEGATIVE ng/mL (ref <50)
Alphahydroxytriazolam: NEGATIVE ng/mL (ref <50)
Aminoclonazepam: NEGATIVE ng/mL (ref <25)
Amphetamines: NEGATIVE ng/mL (ref <500)
Barbiturates: NEGATIVE ng/mL (ref <300)
Benzodiazepines: POSITIVE ng/mL — AB (ref <100)
Cocaine Metabolite: NEGATIVE ng/mL (ref <150)
Creatinine: 202.6 mg/dL
Hydroxyethylflurazepam: NEGATIVE ng/mL (ref <50)
Lorazepam: NEGATIVE ng/mL (ref <50)
Marijuana Metabolite: NEGATIVE ng/mL (ref <20)
Methadone Metabolite: NEGATIVE ng/mL (ref <100)
Nordiazepam: NEGATIVE ng/mL (ref <50)
Opiates: NEGATIVE ng/mL (ref <100)
Oxazepam: NEGATIVE ng/mL (ref <50)
Oxidant: NEGATIVE ug/mL
Oxycodone: NEGATIVE ng/mL (ref <100)
Temazepam: NEGATIVE ng/mL (ref <50)
pH: 7 (ref 4.5–9.0)

## 2019-08-20 LAB — DRUG MONITOR, TRAMADOL,QN, URINE
Desmethyltramadol: NEGATIVE ng/mL (ref <100)
Tramadol: NEGATIVE ng/mL (ref <100)

## 2019-08-20 LAB — DM TEMPLATE

## 2019-08-20 NOTE — Telephone Encounter (Addendum)
Patient states she is not longer taking PLQ.

## 2019-08-20 NOTE — Progress Notes (Signed)
Please review the refill history.

## 2019-08-25 ENCOUNTER — Telehealth (INDEPENDENT_AMBULATORY_CARE_PROVIDER_SITE_OTHER): Payer: Medicare Other | Admitting: Psychiatry

## 2019-08-25 ENCOUNTER — Other Ambulatory Visit: Payer: Self-pay

## 2019-08-25 ENCOUNTER — Encounter (HOSPITAL_COMMUNITY): Payer: Self-pay | Admitting: Psychiatry

## 2019-08-25 DIAGNOSIS — F331 Major depressive disorder, recurrent, moderate: Secondary | ICD-10-CM | POA: Diagnosis not present

## 2019-08-25 MED ORDER — ALPRAZOLAM 0.5 MG PO TABS: 0.5000 mg | ORAL_TABLET | Freq: Two times a day (BID) | ORAL | 2 refills | Status: DC

## 2019-08-25 MED ORDER — AMITRIPTYLINE HCL 25 MG PO TABS: 25.0000 mg | ORAL_TABLET | Freq: Every day | ORAL | 2 refills | Status: DC

## 2019-08-25 MED ORDER — ESCITALOPRAM OXALATE 20 MG PO TABS: 20.0000 mg | ORAL_TABLET | Freq: Two times a day (BID) | ORAL | 2 refills | Status: DC

## 2019-08-25 NOTE — Progress Notes (Addendum)
Virtual Visit via Telephone Note  I connected with Alexandria Leach on 08/25/19 at  9:20 AM EDT by telephone and verified that I am speaking with the correct person using two identifiers.   I discussed the limitations, risks, security and privacy concerns of performing an evaluation and management service by telephone and the availability of in person appointments. I also discussed with the patient that there may be a patient responsible charge related to this service. The patient expressed understanding and agreed to proceed.    I discussed the assessment and treatment plan with the patient. The patient was provided an opportunity to ask questions and all were answered. The patient agreed with the plan and demonstrated an understanding of the instructions.   The patient was advised to call back or seek an in-person evaluation if the symptoms worsen or if the condition fails to improve as anticipated.  I provided 15 minutes of non-face-to-face time during this encounter.  Location: Provider office, patient home [name], MD  [facility] MD/PA/NP OP Progress Note  08/25/2019 9:39 AM [name]  MRN:  [id number]  Chief Complaint:  Chief Complaint    Depression; Anxiety; Follow-up     HPI: This patient is a 69 year old separated black female who lives with her son in [place].  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  She has 4 sons and 3 grandchildren.  The patient returns for follow-up after 3 months.  She states that for the most part she has been doing okay.  She still having a lot of dryness in her throat and eyes due to the Sjogren's syndrome.  Most of the traditional treatments have not worked well for her.  Overall however she states her mood has been good and she has been spending a lot of time with family.  She did get her coronavirus vaccine.  She denies significant depression or anxiety and she is  sleeping fairly well with the amitriptyline. Visit Diagnosis:    ICD-10-CM   1. Major depressive disorder, recurrent episode, moderate (HCC)  F33.1     Past Psychiatric History: none  Past Medical History:  Past Medical History:  Diagnosis Date  . Ankle fracture, left 02/19/2015   from a fall  . Anxiety   . Cataract 2014   corrected with surgery  . CHF (congestive heart failure) ([place])   . CKD (chronic kidney disease) stage 3, GFR 30-59 ml/min    saw nephrologist Dr Harden Mo  . DDD (degenerative disc disease), cervical   . Depression   . Fibromyalgia   . Glaucoma    s/p surgery, sees ophtho Q6 mo  . 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 History of kidney stones 2010  . History of pulmonary embolism 2001, 2006   completed coumadin courses  . History of rheumatic fever x3  . HLD (hyperlipidemia)   . HTN (hypertension)   . Insomnia   . Lung nodule 09/22/2012   RLL - 55mm, stable since 2014. Thought benign.   . Osteoarthritis    shoulders and knees, not RA per Dr Estanislado Pandy, positive ANA, positive Ro  . Osteopenia 09/18/2015   DEXA T -1.1 hip, -0.2 spine 08/2015   . Personal history of urinary calculi latest 2014  . Pneumonia 12/02/2011  . PONV (postoperative nausea and vomiting)   . Refusal of blood transfusions as patient is [employer]  Witness   . Rheumatic heart disease 1980   s/p mitral valve repair 1980  . Sjogren's syndrome ([place])   . Trimalleolar fracture of left ankle 02/23/2015    Past Surgical History:  Procedure Laterality Date  . BREAST BIOPSY Right 2006   benign  . CHOLECYSTECTOMY  11/27/2011   Procedure: LAPAROSCOPIC CHOLECYSTECTOMY WITH INTRAOPERATIVE CHOLANGIOGRAM;  Surgeon: Alexandria Hector, MD;  Location: [place];  Service: General;  Laterality: N/A;  laparoscopic cholecystectomy with choleangiogram umbilical hernia repair  . COLONOSCOPY  07/2014   WNL Alexandria Leach)  . dexa  08/2012   normal per patient  - no records available  . EYE SURGERY Bilateral 2014   cataract removal  . MITRAL VALVE REPAIR  1980   open heart  . ORIF ANKLE FRACTURE Left 02/26/2015   Procedure: OPEN REDUCTION INTERNAL FIXATION (ORIF) LEFT TRIMALLEOLAR ANKLE FRACTURE;  Surgeon: Alexandria Koyanagi, MD;  Location: [place];  Service: Orthopedics;  Laterality: Left;  . TUBAL LIGATION  1980  . UMBILICAL HERNIA REPAIR  11/27/2011   Procedure: HERNIA REPAIR UMBILICAL ADULT;  Surgeon: Alexandria Hector, MD;  Location: [place];  Service: General;  Laterality: N/A;  . [place]   for fibroids -- partial, ovaries remain    Family Psychiatric History: see below  Family History:  Family History  Problem Relation Age of Onset  . Lupus Sister        and niece  . Cancer Mother        lung (nonsmoker)  . CAD Mother        MI in her 44s  . ALS Mother   . Kidney disease Father   . Alcohol abuse Father   . Diabetes Father   . Cancer Brother        bone  . Diabetes Sister   . Stroke Sister   . Cancer Maternal Uncle        bone  . Depression Sister   . Lupus Sister   . Kidney failure Other        on HD  . Diabetes Brother   . Heart attack Brother   . Stroke Maternal Grandmother   . Blindness Sister   . Healthy Son   . Healthy Son   . Healthy Son   . Healthy Son     Social History:  Social History   Socioeconomic History  . Marital status: Married    Spouse name: Alexandria Leach  . Number of children: 4  . Years of education: 59  . Highest education level: Not on file  Occupational History    Employer: UNEMPLOYED    Comment: Disability  Tobacco Use  . Smoking status: Never Smoker  . Smokeless tobacco: Never Used  Substance and Sexual Activity  . Alcohol use: No    Alcohol/week: 0.0 standard drinks  . Drug use: No  . Sexual activity: Not Currently    Birth control/protection: Surgical  Other Topics Concern  . Not on file  Social History Narrative   Lives with son, 1 dog   Occupation: unemployed, on  disability for fibromyalgia since 2008.   Edu: HS   Religion: Jehova's witness   Activity: volunteers at senior center   Diet: some water, fruits/vegetables daily   No caffeine use   Social Determinants of Health   Financial Resource Strain: Low Risk   . Difficulty of Paying Living Expenses: Not hard at all  Food Insecurity: No Food Insecurity  . Worried About Estate manager/land agent  of Food in the Last Year: Never true  . Ran Out of Food in the Last Year: Never true  Transportation Needs: No Transportation Needs  . Lack of Transportation (Medical): No  . Lack of Transportation (Non-Medical): No  Physical Activity: Inactive  . Days of Exercise per Week: 0 days  . Minutes of Exercise per Session: 0 min  Stress: Stress Concern Present  . Feeling of Stress : To some extent  Social Connections:   . Frequency of Communication with Friends and Family:   . Frequency of Social Gatherings with Friends and Family:   . Attends Religious Services:   . Active Member of Clubs or Organizations:   . Attends Archivist Meetings:   Marland Kitchen Marital Status:     Allergies:  Allergies  Allergen Reactions  . Cymbalta [Duloxetine Hcl] Other (See Comments)    tachycardia  . Statins Nausea Only and Other (See Comments)    Muscle cramps also  . Sulfa Antibiotics Nausea And Vomiting    Metabolic Disorder Labs: Lab Results  Component Value Date   HGBA1C 5.6 10/13/2015   MPG 114 10/13/2015   No results found for: PROLACTIN Lab Results  Component Value Date   CHOL 175 12/15/2018   TRIG 70.0 12/15/2018   HDL 80.40 12/15/2018   CHOLHDL 2 12/15/2018   VLDL 14.0 12/15/2018   LDLCALC 80 12/15/2018   LDLCALC 158 (H) 12/11/2017   Lab Results  Component Value Date   TSH 1.91 10/06/2018   TSH 3.55 04/03/2018    Therapeutic Level Labs: No results found for: LITHIUM No results found for: VALPROATE No components found for:  CBMZ  Current Medications: Current Outpatient Medications  Medication Sig  Dispense Refill  . ALPRAZolam (XANAX) 0.5 MG tablet Take 1 tablet (0.5 mg total) by mouth 2 (two) times daily. 180 tablet 2  . amitriptyline (ELAVIL) 25 MG tablet Take 1 tablet (25 mg total) by mouth at bedtime. 90 tablet 2  . antiseptic oral rinse (BIOTENE) LIQD 15 mLs by Mouth Rinse route at bedtime.     Marland Kitchen aspirin 81 MG tablet Take 1 tablet (81 mg total) by mouth daily.    . cholecalciferol (VITAMIN D) 1000 UNITS tablet Take 1,000 Units by mouth daily.     Marland Kitchen Co-Enzyme Q-10 100 MG CAPS Take 1 capsule (100 mg total) by mouth daily.  0  . diclofenac sodium (VOLTAREN) 1 % GEL Apply 1 application topically 2 (two) times daily as needed (pain).    Marland Kitchen diltiazem (CARDIZEM CD) 360 MG 24 hr capsule Take 1 capsule (360 mg total) by mouth daily. 90 capsule 3  . escitalopram (LEXAPRO) 20 MG tablet Take 1 tablet (20 mg total) by mouth 2 (two) times daily. 180 tablet 2  . ezetimibe (ZETIA) 10 MG tablet TAKE 1 TABLET BY MOUTH AT  BEDTIME 90 tablet 3  . famotidine (PEPCID) 20 MG tablet TAKE 1 TABLET BY MOUTH TWO  TIMES DAILY 180 tablet 3  . furosemide (LASIX) 20 MG tablet TAKE 1 TABLET BY MOUTH  DAILY AS NEEDED FOR EDEMA 90 tablet 1  . gabapentin (NEURONTIN) 300 MG capsule TAKE 2 CAPSULES BY MOUTH  EVERY MORNING, 1 CAPSULE  EVERY AFTERNOON AND 2  CAPSULES AT BEDTIME. 450 capsule 3  . lovastatin (MEVACOR) 10 MG tablet TAKE 1 TABLET BY MOUTH  EVERY MONDAY, WEDNESDAY,  AND FRIDAY 39 tablet 3  . metoprolol tartrate (LOPRESSOR) 50 MG tablet Take 1.5 tablets (75 mg total) by mouth 2 (two) times  daily. 270 tablet 3  . Polyethyl Glycol-Propyl Glycol (SYSTANE) 0.4-0.3 % GEL Place 1 drop into both eyes 2 (two) times daily.     . RESTASIS 0.05 % ophthalmic emulsion INSTILL ONE DROP IN BOTH  EYES TWO TIMES DAILY 180 each 0  . traMADol (ULTRAM) 50 MG tablet Take 1 tablet (50 mg total) by mouth 3 (three) times daily. 60 tablet 0   No current facility-administered medications for this visit.     Musculoskeletal: Strength  & Muscle Tone: within normal limits Gait & Station: normal Patient leans: N/A  Psychiatric Specialty Exam: Review of Systems  Constitutional: Positive for fatigue.  HENT:       Dryness  Musculoskeletal: Positive for arthralgias.  All other systems reviewed and are negative.   There were no vitals taken for this visit.There is no height or weight on file to calculate BMI.  General Appearance: NA  Eye Contact:  NA  Speech:  Clear and Coherent  Volume:  Normal  Mood:  Euthymic  Affect:  NA  Thought Process:  Goal Directed  Orientation:  Full (Time, Place, and Person)  Thought Content: WDL   Suicidal Thoughts:  No  Homicidal Thoughts:  No  Memory:  Immediate;   Good Recent;   Good Remote;   Good  Judgement:  Good  Insight:  Fair  Psychomotor Activity:  Decreased  Concentration:  Concentration: Good and Attention Span: Good  Recall:  Good  Fund of Knowledge: Good  Language: Good  Akathisia:  No  Handed:  Right  AIMS (if indicated): not done  Assets:  Communication Skills Desire for Improvement Resilience Social Support Talents/Skills  ADL's:  Intact  Cognition: WNL  Sleep:  Good   Screenings: GAD-7     Office Visit from 12/22/2018 in [place] at [facility]  Total GAD-7 Score  5    Mini-Mental     Clinical Support from 12/11/2017 in [place] at [place] from 12/09/2016 in [place] at [place] from 11/30/2015 in [place] at [facility]  Total Score (max 30 points )  20  19  18     PHQ2-9     Office Visit from 12/22/2018 in [place] at [place] from 12/21/2018 in [place] at [place] from 12/11/2017 in [place] at [place] from 12/09/2016 in [place] at [place] from 11/30/2015 in [place] at [facility] Total Score  0  1  0  2  4  PHQ-9 Total Score  4   1  0  4  13       Assessment and Plan: This patient is a 69 year old female with a history of depression and anxiety.  She continues to do well.  She will continue Lexapro 20 mg twice daily for depression and Xanax 0.5 mg twice daily as needed for anxiety.  She also has amitriptyline 25 mg at bedtime to use as needed for sleep.  She will return to see me in 3 months   [name], MD 08/25/2019, 9:39 AM

## 2019-08-27 DIAGNOSIS — Z03818 Encounter for observation for suspected exposure to other biological agents ruled out: Secondary | ICD-10-CM | POA: Diagnosis not present

## 2019-08-27 NOTE — Progress Notes (Signed)
Office Visit Note  Patient: Alexandria Leach             Date of Birth: [birth year]           MRN: [id number]             PCP: Ria Bush, MD Referring: Ria Bush, MD Visit Date: 09/10/2019 Occupation: @GUAROCC @  Subjective:  Muscle tenderness   History of Present Illness: Alexandria Leach is a 69 y.o. female with history of Sjogren's syndrome and fibromyalgia.  She has chronic sicca symptoms.  She uses Restasis eyedrops, Systane eyedrops and Biotene mouth rinse for symptomatic relief.  She has been using these products more frequently recently, which has been improving her symptoms.  She states she continues to have generalized myalgias, muscle tenderness and fatigue secondary to fibromyalgia. She takes tramadol 50 mg 1 tablet by mouth 3 times daily for pain relief.  She has been walking daily for exercise.   Activities of Daily Living:  Patient reports morning stiffness for  30  minutes.   Patient Reports nocturnal pain.  Difficulty dressing/grooming: Denies Difficulty climbing stairs: Reports Difficulty getting out of chair: Denies Difficulty using hands for taps, buttons, cutlery, and/or writing: Reports  Review of Systems  Constitutional: Positive for fatigue.  HENT: Positive for mouth dryness and nose dryness. Negative for mouth sores.   Eyes: Positive for dryness. Negative for pain, itching and visual disturbance.  Respiratory: Negative for cough, hemoptysis, shortness of breath and difficulty breathing.   Cardiovascular: Positive for swelling in legs/feet. Negative for chest pain, palpitations and hypertension.  Gastrointestinal: Negative for blood in stool, constipation and diarrhea.  Endocrine: Negative for increased urination.  Genitourinary: Negative for difficulty urinating and painful urination.  Musculoskeletal: Positive for arthralgias, joint pain and morning stiffness. Negative for joint swelling, myalgias, muscle weakness, muscle tenderness  and myalgias.  Skin: Negative for color change, pallor, rash, hair loss, nodules/bumps, redness, skin tightness, ulcers and sensitivity to sunlight.  Allergic/Immunologic: Negative for susceptible to infections.  Neurological: Positive for numbness. Negative for dizziness, headaches, memory loss and weakness.  Hematological: Negative for bruising/bleeding tendency and swollen glands.  Psychiatric/Behavioral: Negative for depressed mood, confusion and sleep disturbance. The patient is not nervous/anxious.     PMFS History:  Patient Active Problem List   Diagnosis Date Noted  . Acute sinusitis 01/25/2019  . Acute right flank pain 07/16/2018  . Sinus tachycardia 05/27/2018  . Transient alteration of awareness 04/04/2018  . Abnormal serum thyroid stimulating hormone (TSH) level 01/30/2018  . 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 Thyroid nodule 09/16/2017  . Neck pain on right side 05/14/2017  . Sialoadenitis of submandibular gland 10/29/2016  . High risk medication use 06/05/2016  . Peripheral neuropathy 01/30/2016  . DNR no code (do not resuscitate) 12/08/2015  . 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 TIA (transient ischemic attack) 10/12/2015  . Mild mitral regurgitation 10/12/2015  . Osteopenia 09/18/2015  . Right arm pain 08/29/2015  . History of fall   . Right sided sciatica 02/21/2015  . Imbalance 01/12/2015  . Transaminitis 12/24/2014  . DDD (degenerative disc disease), cervical   . Health maintenance examination 10/18/2014  . Advanced care planning/counseling discussion 10/18/2014  . Medicare annual wellness visit, initial 10/18/2014  . Refusal of blood transfusions as patient is Jehovah's Witness   . Sjogren's syndrome ([place])   . CKD (chronic kidney disease) stage 3, GFR 30-59 ml/min   . Glaucoma   . Fibromyalgia   . Osteoarthritis   .  History of pulmonary embolism   . Lung nodule 09/22/2012  . Diastolic CHF ([place]) 54/27/0623  . Aortic  stenosis 04/22/2011  . Palpitations 08/10/2010  . HOT FLASHES 06/28/2008  . ARTHRALGIA 07/01/2007  . BACK PAIN, LEFT 08/25/2006  . GAD (generalized anxiety disorder) 03/28/2006  . History of mitral valve repair 03/28/2006  . TAH/BSO, HX OF 03/28/2006  . HYPERCHOLESTEROLEMIA 12/31/2005  . MDD (major depressive disorder), recurrent episode ([place]) 12/31/2005  . RHEUMATIC HEART DISEASE 12/31/2005  . Essential hypertension 12/31/2005  . Chronic insomnia 12/31/2005    Past Medical History:  Diagnosis Date  . Ankle fracture, left 02/19/2015   from a fall  . Anxiety   . Cataract 2014   corrected with surgery  . CHF (congestive heart failure) ([place])   . CKD (chronic kidney disease) stage 3, GFR 30-59 ml/min    saw nephrologist Dr Harden Mo  . DDD (degenerative disc disease), cervical   . Depression   . Fibromyalgia   . Glaucoma    s/p surgery, sees ophtho Q6 mo  . 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 History of kidney stones 2010  . History of pulmonary embolism 2001, 2006   completed coumadin courses  . History of rheumatic fever x3  . HLD (hyperlipidemia)   . HTN (hypertension)   . Insomnia   . Lung nodule 09/22/2012   RLL - 31mm, stable since 2014. Thought benign.   . Osteoarthritis    shoulders and knees, not RA per Dr Estanislado Pandy, positive ANA, positive Ro  . Osteopenia 09/18/2015   DEXA T -1.1 hip, -0.2 spine 08/2015   . Personal history of urinary calculi latest 2014  . Pneumonia 12/02/2011  . PONV (postoperative nausea and vomiting)   . Refusal of blood transfusions as patient is Jehovah's Witness   . Rheumatic heart disease 1980   s/p mitral valve repair 1980  . Sjogren's syndrome ([place])   . Trimalleolar fracture of left ankle 02/23/2015    Family History  Problem Relation Age of Onset  . Lupus Sister        and niece  . Cancer Mother        lung (nonsmoker)  . CAD Mother        MI in her 75s  . ALS  Mother   . Kidney disease Father   . Alcohol abuse Father   . Diabetes Father   . Cancer Brother        bone  . Diabetes Sister   . Stroke Sister   . Cancer Maternal Uncle        bone  . Depression Sister   . Lupus Sister   . Kidney failure Other        on HD  . Diabetes Brother   . Heart attack Brother   . Stroke Maternal Grandmother   . Blindness Sister   . Healthy Son   . Healthy Son   . Healthy Son   . Healthy Son    Past Surgical History:  Procedure Laterality Date  . BREAST BIOPSY Right 2006   benign  . CHOLECYSTECTOMY  11/27/2011   Procedure: LAPAROSCOPIC CHOLECYSTECTOMY WITH INTRAOPERATIVE CHOLANGIOGRAM;  Surgeon: Adin Hector, MD;  Location: [place];  Service: General;  Laterality: N/A;  laparoscopic cholecystectomy with choleangiogram umbilical hernia repair  . COLONOSCOPY  07/2014   WNL Amedeo Plenty)  . COLONOSCOPY  09/01/2019   3 polyps removed   . dexa  08/2012   normal per patient - no records available  . EYE SURGERY Bilateral 2014   cataract removal  . MITRAL VALVE REPAIR  1980   open heart  . ORIF ANKLE FRACTURE Left 02/26/2015   Procedure: OPEN REDUCTION INTERNAL FIXATION (ORIF) LEFT TRIMALLEOLAR ANKLE FRACTURE;  Surgeon: [name], MD;  Location: [place];  Service: Orthopedics;  Laterality: Left;  . TUBAL LIGATION  1980  . UMBILICAL HERNIA REPAIR  11/27/2011   Procedure: HERNIA REPAIR UMBILICAL ADULT;  Surgeon: Adin Hector, MD;  Location: [place];  Service: General;  Laterality: N/A;  . [place]   for fibroids -- partial, ovaries remain   Social History   Social History Narrative   Lives with son, 1 dog   Occupation: unemployed, on disability for fibromyalgia since 2008.   Edu: HS   Religion: Jehova's witness   Activity: volunteers at senior center   Diet: some water, fruits/vegetables daily   No caffeine use   Immunization History  Administered Date(s) Administered  . Pneumococcal Conjugate-13 12/15/2017  . Pneumococcal  Polysaccharide-23 09/15/2012, 12/22/2018     Objective: Vital Signs: BP (!) 153/89 (BP Location: Left Arm, Patient Position: Sitting, Cuff Size: Normal)   Pulse 73   Resp 17   Ht 5\' 1"  (1.549 m)   Wt 200 lb 12.8 oz (91.1 kg)   BMI 37.94 kg/m    Physical Exam Vitals and nursing note reviewed.  Constitutional:      Appearance: She is well-developed.  HENT:     Head: Normocephalic and atraumatic.  Eyes:     Conjunctiva/sclera: Conjunctivae normal.  Cardiovascular:     Pulses: Normal pulses.     Heart sounds: Normal heart sounds.  Pulmonary:     Effort: Pulmonary effort is normal.     Breath sounds: Normal breath sounds.  Abdominal:     General: Bowel sounds are normal.     Palpations: Abdomen is soft.  Musculoskeletal:     Cervical back: Normal range of motion.  Lymphadenopathy:     Cervical: No cervical adenopathy.  Skin:    General: Skin is warm and dry.     Capillary Refill: Capillary refill takes less than 2 seconds.  Neurological:     Mental Status: She is alert and oriented to person, place, and time.  Psychiatric:        Behavior: Behavior normal.      Musculoskeletal Exam: Generalized hyperalgesia and positive tender points.  C-spine, thoracic spine, lumbar spine have good range of motion.  Shoulder joints, elbow joints, wrist joints, MCPs, PIPs and DIPs good range of motion with no synovitis.  She has mild DIP thickening consistent with osteoarthritis of both hands.  She has complete fist formation bilaterally.  Hip joints have limited range of motion bilaterally.  She has tenderness over bilateral trochanteric bursa.  Knee joints have good range of motion with crepitus bilaterally.  No warmth or effusion of knee joints noted.  Pedal edema noted bilaterally.  CDAI Exam: CDAI Score: -- Patient Global: --; Provider Global: -- Swollen: --; Tender: -- Joint Exam 09/10/2019   No joint exam has been documented for this visit   There is currently no information  documented on the homunculus. Go to the Rheumatology activity and complete the homunculus joint exam.  Investigation: No additional findings.  Imaging: No results found.  Recent Labs: Lab Results  Component Value Date   WBC 4.1 04/20/2019   HGB 12.1 04/20/2019   PLT  241 04/20/2019   NA 139 04/20/2019   K 4.2 04/20/2019   CL 109 04/20/2019   CO2 21 04/20/2019   GLUCOSE 132 (H) 04/20/2019   BUN 10 04/20/2019   CREATININE 1.28 (H) 04/20/2019   BILITOT 0.4 04/20/2019   ALKPHOS 71 07/16/2018   AST 26 04/20/2019   ALT 15 04/20/2019   PROT 7.1 04/20/2019   ALBUMIN 4.2 12/15/2018   CALCIUM 9.7 04/20/2019   GFRAA 50 (L) 04/20/2019    Speciality Comments: PLQ Eye Exam: 08/03/2018 WNL @ [facility] Follow up in 1 year  Procedures:  No procedures performed Allergies: Cymbalta [duloxetine hcl], Statins, and Sulfa antibiotics   Assessment / Plan:     Visit Diagnoses: Sjogren's syndrome with keratoconjunctivitis sicca ([place]) - +ANA, +Ro: She continues to have chronic sicca symptoms.  She has been using Restasis eyedrops, Systane eyedrops, Biotene rinse for symptomatic relief.  She has been using these products more frequently which has improved her symptoms.  She discontinued Plaquenil in February 2021.  She previously tried taking pilocarpine but discontinued due to excessive sweating.  We discussed over the counter products as well as purchasing a humidifier.  She will follow-up in the office in 5 months.  High risk medication use -Discontinued Plaquenil in February 2021.  Fibromyalgia: She has generalized hyperalgesia and positive tender points on exam.  She continues have generalized myalgias, muscle tenderness, fatigue secondary to fibromyalgia.  We discussed the importance of regular exercise and good sleep hygiene.  We discussed the importance of water therapy or water aerobics.  She has been taking tramadol 50 mg 1 tablet 3 times daily for pain relief.  She does not need a refill  at this time.  Other insomnia: She continues to take amitriptyline 25 mg 1 tablet by mouth at bedtime.  We discussed that amitriptyline is likely worsening her sicca symptoms.  Good sleep hygiene was discussed.  Other chronic pain: She takes tramadol 50 mg 1 tablet 3 times daily for pain relief.  UDS was updated on 08/17/2019.  Other fatigue: Chronic but stable.  Discussed importance of regular exercise.  DDD (degenerative disc disease), cervical: She has good range of motion with no discomfort at this time.  She has no symptoms of radiculopathy.  She has trapezius muscle tension and muscle tenderness bilaterally.  She uses a heating pad on a regular basis.  Osteopenia of multiple sites: DEXA ordered by PCP.   Thyroid nodule: Thyroid ultrasound on 10/16/19: Left isthmic thyroid nodule (labeled 2) meets criteria for  surveillance, as designated by the newly established ACR TI-RADS criteria. Surveillance ultrasound study recommended to be performed annually up to 5 years.  She was advised to reach out to her PCP to discuss scheduling the yearly ultrasound which will be due at the end of July/early August 2021.  Other medical conditions are listed as follows:  History of pulmonary embolism  History of hypertension  Stage 3a chronic kidney disease  History of anxiety  History of hypercholesterolemia  Orders: No orders of the defined types were placed in this encounter.  No orders of the defined types were placed in this encounter.    Follow-Up Instructions: Return in about 5 months (around 02/10/2020) for Sjogren's syndrome, Fibromyalgia.   Hazel Sams, PA-C  I examined and evaluated the patient with Hazel Sams PA.  Patient had no synovitis on examination.  She continues to have some sicca symptoms which are manageable with over-the-counter products.  She also continues to have some discomfort  from fibromyalgia.  The plan of care was discussed as noted above.  Bo Merino,  MD  Note - This record has been created using Editor, commissioning.  Chart creation errors have been sought, but may not always  have been located. Such creation errors do not reflect on  the standard of medical care.

## 2019-09-01 DIAGNOSIS — K2970 Gastritis, unspecified, without bleeding: Secondary | ICD-10-CM | POA: Diagnosis not present

## 2019-09-01 DIAGNOSIS — K644 Residual hemorrhoidal skin tags: Secondary | ICD-10-CM | POA: Diagnosis not present

## 2019-09-01 DIAGNOSIS — R12 Heartburn: Secondary | ICD-10-CM | POA: Diagnosis not present

## 2019-09-01 DIAGNOSIS — D124 Benign neoplasm of descending colon: Secondary | ICD-10-CM | POA: Diagnosis not present

## 2019-09-01 DIAGNOSIS — D120 Benign neoplasm of cecum: Secondary | ICD-10-CM | POA: Diagnosis not present

## 2019-09-01 DIAGNOSIS — Z8371 Family history of colonic polyps: Secondary | ICD-10-CM | POA: Diagnosis not present

## 2019-09-01 DIAGNOSIS — Z1211 Encounter for screening for malignant neoplasm of colon: Secondary | ICD-10-CM | POA: Diagnosis not present

## 2019-09-01 DIAGNOSIS — D123 Benign neoplasm of transverse colon: Secondary | ICD-10-CM | POA: Diagnosis not present

## 2019-09-01 DIAGNOSIS — K319 Disease of stomach and duodenum, unspecified: Secondary | ICD-10-CM | POA: Diagnosis not present

## 2019-09-01 DIAGNOSIS — K648 Other hemorrhoids: Secondary | ICD-10-CM | POA: Diagnosis not present

## 2019-09-01 LAB — HM COLONOSCOPY

## 2019-09-03 ENCOUNTER — Telehealth: Payer: Self-pay | Admitting: *Deleted

## 2019-09-03 DIAGNOSIS — D123 Benign neoplasm of transverse colon: Secondary | ICD-10-CM | POA: Diagnosis not present

## 2019-09-03 DIAGNOSIS — D124 Benign neoplasm of descending colon: Secondary | ICD-10-CM | POA: Diagnosis not present

## 2019-09-03 DIAGNOSIS — K319 Disease of stomach and duodenum, unspecified: Secondary | ICD-10-CM | POA: Diagnosis not present

## 2019-09-03 DIAGNOSIS — D120 Benign neoplasm of cecum: Secondary | ICD-10-CM | POA: Diagnosis not present

## 2019-09-03 NOTE — Telephone Encounter (Signed)
Patient contacted the office and states she is having trouble getting her Tramadol prescription from [facility]. Advised patient I would call [employer] to see what the problem will filling the prescription is. Contacted [employer] and was advised that they need a prior authorization for the Tramadol. Patient advsied and PA submitted via cover my meds.

## 2019-09-04 ENCOUNTER — Other Ambulatory Visit: Payer: Self-pay | Admitting: Rheumatology

## 2019-09-04 DIAGNOSIS — M3501 Sicca syndrome with keratoconjunctivitis: Secondary | ICD-10-CM

## 2019-09-06 ENCOUNTER — Other Ambulatory Visit: Payer: Self-pay | Admitting: Family Medicine

## 2019-09-06 NOTE — Telephone Encounter (Signed)
Patient states she is not longer taking PLQ.

## 2019-09-10 ENCOUNTER — Other Ambulatory Visit: Payer: Self-pay

## 2019-09-10 ENCOUNTER — Encounter: Payer: Self-pay | Admitting: Rheumatology

## 2019-09-10 ENCOUNTER — Telehealth: Payer: Self-pay | Admitting: Cardiology

## 2019-09-10 ENCOUNTER — Ambulatory Visit (INDEPENDENT_AMBULATORY_CARE_PROVIDER_SITE_OTHER): Payer: Medicare Other | Admitting: Rheumatology

## 2019-09-10 VITALS — BP 153/89 | HR 73 | Resp 17 | Ht 61.0 in | Wt 200.8 lb

## 2019-09-10 DIAGNOSIS — N1831 Chronic kidney disease, stage 3a: Secondary | ICD-10-CM

## 2019-09-10 DIAGNOSIS — M3501 Sicca syndrome with keratoconjunctivitis: Secondary | ICD-10-CM | POA: Diagnosis not present

## 2019-09-10 DIAGNOSIS — M797 Fibromyalgia: Secondary | ICD-10-CM

## 2019-09-10 DIAGNOSIS — R5383 Other fatigue: Secondary | ICD-10-CM

## 2019-09-10 DIAGNOSIS — E041 Nontoxic single thyroid nodule: Secondary | ICD-10-CM

## 2019-09-10 DIAGNOSIS — G8929 Other chronic pain: Secondary | ICD-10-CM | POA: Diagnosis not present

## 2019-09-10 DIAGNOSIS — M8589 Other specified disorders of bone density and structure, multiple sites: Secondary | ICD-10-CM

## 2019-09-10 DIAGNOSIS — Z79899 Other long term (current) drug therapy: Secondary | ICD-10-CM | POA: Diagnosis not present

## 2019-09-10 DIAGNOSIS — G4709 Other insomnia: Secondary | ICD-10-CM

## 2019-09-10 DIAGNOSIS — Z8679 Personal history of other diseases of the circulatory system: Secondary | ICD-10-CM

## 2019-09-10 DIAGNOSIS — Z8639 Personal history of other endocrine, nutritional and metabolic disease: Secondary | ICD-10-CM

## 2019-09-10 DIAGNOSIS — Z86711 Personal history of pulmonary embolism: Secondary | ICD-10-CM

## 2019-09-10 DIAGNOSIS — Z8659 Personal history of other mental and behavioral disorders: Secondary | ICD-10-CM

## 2019-09-10 DIAGNOSIS — M5030 Other cervical disc degeneration, unspecified cervical region: Secondary | ICD-10-CM

## 2019-09-10 NOTE — Telephone Encounter (Signed)
Spoke with pt, echocardiogram scheduled.

## 2019-09-10 NOTE — Telephone Encounter (Signed)
Patient calling to schedule her cardiac catheterization.

## 2019-09-17 ENCOUNTER — Other Ambulatory Visit: Payer: Self-pay

## 2019-09-17 ENCOUNTER — Ambulatory Visit: Payer: Medicare Other

## 2019-09-17 DIAGNOSIS — I10 Essential (primary) hypertension: Secondary | ICD-10-CM

## 2019-09-17 DIAGNOSIS — I5032 Chronic diastolic (congestive) heart failure: Secondary | ICD-10-CM

## 2019-09-17 NOTE — Patient Instructions (Addendum)
Dear Alexandria Leach,  Below is a summary of the goals we discussed during our follow up appointment on September 17, 2019. Please contact me anytime with questions or concerns.   Visit Information  Goals Addressed            This Visit's Progress   . Pharmacy Care Plan       Current Barriers:  . Chronic Disease Management support, education, and care coordination needs related to: hypertension, heart failure  Pharmacist Clinical Goal(s):  Marland Kitchen Hypertension: Maintain blood pressure within goal of less than 140/90 mmHg. Marland Kitchen Heart failure: Prevent fluid overload and shortness of breath . Remain up to date on vaccinations. Recommend 2-dose series of shingles vaccine (Shingrix) and the tetanus vaccine (Tdap) from your pharmacy.   Interventions: . Comprehensive medication review performed. . Reviewed home blood pressure monitoring and s/s of heart failure   Patient Self Care Activities:  . Continue to check weight daily in the morning and limit sodium. Take Lasix as needed, 2 tablets daily with weight gain of 3 pounds over night or 5 pounds in 1 week. Current weight reported is 193-194 lbs.  . Continue to monitor blood pressure once weekly.   Please see past updates related to this goal by clicking on the "Past Updates" button in the selected goal       The patient verbalized understanding of instructions provided today and agreed to receive a mailed copy of patient instruction and/or educational materials.  Telephone follow up appointment with pharmacy team member scheduled for:  03/20/20 at 1:00 PM   Debbora Dus, PharmD Clinical Pharmacist [facility] [phone]   Low-Sodium Eating Plan Sodium, which is an element that makes up salt, helps you maintain a healthy balance of fluids in your body. Too much sodium can increase your blood pressure and cause fluid and waste to be held in your body. Your health care provider or dietitian may recommend  following this plan if you have high blood pressure (hypertension), kidney disease, liver disease, or heart failure. Eating less sodium can help lower your blood pressure, reduce swelling, and protect your heart, liver, and kidneys. What are tips for following this plan? General guidelines  Most people on this plan should limit their sodium intake to 1,500-2,000 mg (milligrams) of sodium each day. Reading food labels   The Nutrition Facts label lists the amount of sodium in one serving of the food. If you eat more than one serving, you must multiply the listed amount of sodium by the number of servings.  Choose foods with less than 140 mg of sodium per serving.  Avoid foods with 300 mg of sodium or more per serving. Shopping  Look for lower-sodium products, often labeled as "low-sodium" or "no salt added."  Always check the sodium content even if foods are labeled as "unsalted" or "no salt added".  Buy fresh foods. ? Avoid canned foods and premade or frozen meals. ? Avoid canned, cured, or processed meats  Buy breads that have less than 80 mg of sodium per slice. Cooking  Eat more home-cooked food and less restaurant, buffet, and fast food.  Avoid adding salt when cooking. Use salt-free seasonings or herbs instead of table salt or sea salt. Check with your health care provider or pharmacist before using salt substitutes.  Cook with plant-based oils, such as canola, sunflower, or olive oil. Meal planning  When eating at a restaurant, ask that your food be prepared with less salt or  no salt, if possible.  Avoid foods that contain MSG (monosodium glutamate). MSG is sometimes added to [place] food, bouillon, and some canned foods. What foods are recommended? The items listed may not be a complete list. Talk with your dietitian about what dietary choices are best for you. Grains Low-sodium cereals, including oats, puffed wheat and rice, and shredded wheat. Low-sodium crackers.  Unsalted rice. Unsalted pasta. Low-sodium bread. Whole-grain breads and whole-grain pasta. Vegetables Fresh or frozen vegetables. "No salt added" canned vegetables. "No salt added" tomato sauce and paste. Low-sodium or reduced-sodium tomato and vegetable juice. Fruits Fresh, frozen, or canned fruit. Fruit juice. Meats and other protein foods Fresh or frozen (no salt added) meat, poultry, seafood, and fish. Low-sodium canned tuna and salmon. Unsalted nuts. Dried peas, beans, and lentils without added salt. Unsalted canned beans. Eggs. Unsalted nut butters. Dairy Milk. Soy milk. Cheese that is naturally low in sodium, such as ricotta cheese, fresh mozzarella, or Swiss cheese Low-sodium or reduced-sodium cheese. Cream cheese. Yogurt. Fats and oils Unsalted butter. Unsalted margarine with no trans fat. Vegetable oils such as canola or olive oils. Seasonings and other foods Fresh and dried herbs and spices. Salt-free seasonings. Low-sodium mustard and ketchup. Sodium-free salad dressing. Sodium-free light mayonnaise. Fresh or refrigerated horseradish. Lemon juice. Vinegar. Homemade, reduced-sodium, or low-sodium soups. Unsalted popcorn and pretzels. Low-salt or salt-free chips. What foods are not recommended? The items listed may not be a complete list. Talk with your dietitian about what dietary choices are best for you. Grains Instant hot cereals. Bread stuffing, pancake, and biscuit mixes. Croutons. Seasoned rice or pasta mixes. Noodle soup cups. Boxed or frozen macaroni and cheese. Regular salted crackers. Self-rising flour. Vegetables Sauerkraut, pickled vegetables, and relishes. Olives. [place] fries. Onion rings. Regular canned vegetables (not low-sodium or reduced-sodium). Regular canned tomato sauce and paste (not low-sodium or reduced-sodium). Regular tomato and vegetable juice (not low-sodium or reduced-sodium). Frozen vegetables in sauces. Meats and other protein foods Meat or fish that is  salted, canned, smoked, spiced, or pickled. Bacon, ham, sausage, hotdogs, corned beef, chipped beef, packaged lunch meats, salt pork, jerky, pickled herring, anchovies, regular canned tuna, sardines, salted nuts. Dairy Processed cheese and cheese spreads. Cheese curds. Blue cheese. Feta cheese. String cheese. Regular cottage cheese. Buttermilk. Canned milk. Fats and oils Salted butter. Regular margarine. Ghee. Bacon fat. Seasonings and other foods Onion salt, garlic salt, seasoned salt, table salt, and sea salt. Canned and packaged gravies. Worcestershire sauce. Tartar sauce. Barbecue sauce. Teriyaki sauce. Soy sauce, including reduced-sodium. Steak sauce. Fish sauce. Oyster sauce. Cocktail sauce. Horseradish that you find on the shelf. Regular ketchup and mustard. Meat flavorings and tenderizers. Bouillon cubes. Hot sauce and Tabasco sauce. Premade or packaged marinades. Premade or packaged taco seasonings. Relishes. Regular salad dressings. Salsa. Potato and tortilla chips. Corn chips and puffs. Salted popcorn and pretzels. Canned or dried soups. Pizza. Frozen entrees and pot pies. Summary  Eating less sodium can help lower your blood pressure, reduce swelling, and protect your heart, liver, and kidneys.  Most people on this plan should limit their sodium intake to 1,500-2,000 mg (milligrams) of sodium each day.  Canned, boxed, and frozen foods are high in sodium. Restaurant foods, fast foods, and pizza are also very high in sodium. You also get sodium by adding salt to food.  Try to cook at home, eat more fresh fruits and vegetables, and eat less fast food, canned, processed, or prepared foods. This information is not intended to replace advice given to you  by your health care provider. Make sure you discuss any questions you have with your health care provider. Document Revised: 02/14/2017 Document Reviewed: 02/26/2016 Elsevier Patient Education  2020 [employer].

## 2019-09-17 NOTE — Chronic Care Management (AMB) (Signed)
Chronic Care Management Pharmacy  Name: Alexandria Leach  MRN: [id number] DOB: 1950/06/25  Chief Complaint/ HPI  Alexandria Leach,  69 y.o., female presents for their Follow-Up CCM visit with the clinical pharmacist via telephone.  PCP : Ria Bush, MD  Their chronic conditions addressed today include: hypertension, heart failure  Denies medication concerns  Office Visits:  06/22/19: PCP visit - try Lasix 2 tablets (40 mg) PRN swelling  06/18/19 - CCM follow up visit  Consults:  08/25/19: Behavioral health - She denies significant depression or anxiety and she is sleeping fairly well with the amitriptyline. Continue Lexapro 20 mg twice daily for depression and Xanax 0.5 mg twice daily as needed for anxiety.  She also has amitriptyline 25 mg at bedtime to use as needed for sleep.  She will return to see me in 3 months.   09/10/19: Rheum/Sjogren's - She has been using Restasis eyedrops, Systane eyedrops, Biotene rinse for symptomatic relief.  She has been using these products more frequently which has improved her symptoms. Discussed using a humidifier. RTC 5 months.  Allergies  Allergen Reactions  . Cymbalta [Duloxetine Hcl] Other (See Comments)    tachycardia  . Statins Nausea Only and Other (See Comments)    Muscle cramps also  . Sulfa Antibiotics Nausea And Vomiting   Current Outpatient Medications on File Prior to Visit  Medication Sig Dispense Refill  . ALPRAZolam (XANAX) 0.5 MG tablet Take 1 tablet (0.5 mg total) by mouth 2 (two) times daily. 180 tablet 2  . amitriptyline (ELAVIL) 25 MG tablet Take 1 tablet (25 mg total) by mouth at bedtime. 90 tablet 2  . antiseptic oral rinse (BIOTENE) LIQD 15 mLs by Mouth Rinse route at bedtime.     Marland Kitchen aspirin 81 MG tablet Take 1 tablet (81 mg total) by mouth daily.    . cholecalciferol (VITAMIN D) 1000 UNITS tablet Take 1,000 Units by mouth daily.     Marland Kitchen Co-Enzyme Q-10 100 MG CAPS Take 1 capsule (100 mg total) by mouth  daily.  0  . diclofenac sodium (VOLTAREN) 1 % GEL Apply 1 application topically 2 (two) times daily as needed (pain).    Marland Kitchen diltiazem (CARDIZEM CD) 360 MG 24 hr capsule Take 1 capsule (360 mg total) by mouth daily. 90 capsule 3  . escitalopram (LEXAPRO) 20 MG tablet Take 1 tablet (20 mg total) by mouth 2 (two) times daily. 180 tablet 2  . ezetimibe (ZETIA) 10 MG tablet TAKE 1 TABLET BY MOUTH AT  BEDTIME 90 tablet 3  . famotidine (PEPCID) 20 MG tablet TAKE 1 TABLET BY MOUTH TWO  TIMES DAILY 180 tablet 3  . furosemide (LASIX) 20 MG tablet TAKE 1 TABLET BY MOUTH  DAILY AS NEEDED FOR EDEMA 90 tablet 3  . gabapentin (NEURONTIN) 300 MG capsule TAKE 2 CAPSULES BY MOUTH  EVERY MORNING, 1 CAPSULE  EVERY AFTERNOON AND 2  CAPSULES AT BEDTIME. 450 capsule 3  . lovastatin (MEVACOR) 10 MG tablet TAKE 1 TABLET BY MOUTH  EVERY MONDAY, WEDNESDAY,  AND FRIDAY 39 tablet 3  . metoprolol tartrate (LOPRESSOR) 50 MG tablet Take 1.5 tablets (75 mg total) by mouth 2 (two) times daily. 270 tablet 3  . Polyethyl Glycol-Propyl Glycol (SYSTANE) 0.4-0.3 % GEL Place 1 drop into both eyes 2 (two) times daily.     . prednisoLONE acetate (PRED FORTE) 1 % ophthalmic suspension Place 1 drop into both eyes 2 (two) times daily.    . RESTASIS 0.05 % ophthalmic  emulsion INSTILL ONE DROP IN BOTH  EYES TWO TIMES DAILY 180 each 0  . traMADol (ULTRAM) 50 MG tablet Take 1 tablet (50 mg total) by mouth 3 (three) times daily. 60 tablet 0   No current facility-administered medications on file prior to visit.   Current Diagnosis/Assessment: Goals    . Increase physical activity     Starting 12/11/2017, I will continue to walk at least 60 min day/4 days per week.     . Patient Stated     12/21/2018, Patient wants to improve her ability to walk better and increase her mobility.     . Pharmacy Care Plan     Current Barriers:  . Chronic Disease Management support, education, and care coordination needs related to: hypertension, heart  failure  Pharmacist Clinical Goal(s):  Marland Kitchen Hypertension: Maintain blood pressure within goal of less than 140/90 mmHg. Marland Kitchen Heart failure: Prevent fluid overload and shortness of breath . Remain up to date on vaccinations. Recommend 2-dose series of shingles vaccine (Shingrix) and the tetanus vaccine (Tdap) from your pharmacy.   Interventions: . Comprehensive medication review performed. . Reviewed home blood pressure monitoring and s/s of heart failure   Patient Self Care Activities:  . Continue to check weight daily in the morning and limit sodium. Take Lasix as needed, 2 tablets daily with weight gain of 3 pounds over night or 5 pounds in 1 week. Current weight reported is 193-194 lbs.  . Continue to monitor blood pressure once weekly.   Please see past updates related to this goal by clicking on the "Past Updates" button in the selected goal       Heart Failure   Type: Diastolic Last ejection fraction: 05/2017 (65-70%) NYHA Class: II (slight limitation of activity) - can do most normal activities, unable to walk up many steps without palpitations or SOB  AHA HF Stage: C (Heart disease and symptoms present)  Patient has failed these meds in past: none Patient is currently controlled on the following medications:   Furosemide 20 mg - 2 tablets daily PRN  Metoprolol tartrate 50 mg - 1.5 tablets (75 mg) BID  We discussed: reports weight stable at 193-194 lbs and SOB stable, still has SOB when walking up steps Sodium intake: continues to limit sodium, avoids chips and table salt   Lasix - reports last took Lasix 5 days ago on Sunday; did not notice weight change but visable swelling in ankles. Took furosemide 20 mg at breakfast and again later in the day. Prefers to divide dose due to increased SOB when she takes Lasix.   Plan: Continue current medications   Hypertension   CMP Latest Ref Rng & Units 04/20/2019 01/21/2019 12/15/2018  Glucose 65 - 99 mg/dL 132(H) 88 84  BUN 7 - 25  mg/dL 10 9 7   Creatinine 0.50 - 0.99 mg/dL 1.28(H) 1.27(H) 1.20  Sodium 135 - 146 mmol/L 139 142 137  Potassium 3.5 - 5.3 mmol/L 4.2 4.3 4.1  Chloride 98 - 110 mmol/L 109 108 105  CO2 20 - 32 mmol/L 21 26 24   Calcium 8.6 - 10.4 mg/dL 9.7 9.8 9.5  Total Protein 6.1 - 8.1 g/dL 7.1 7.2 -  Total Bilirubin 0.2 - 1.2 mg/dL 0.4 0.5 -  Alkaline Phos 39 - 117 U/L - - -  AST 10 - 35 U/L 26 22 -  ALT 6 - 29 U/L 15 14 -   Office blood pressures are  BP Readings from Last 3 Encounters:  09/10/19 Marland Kitchen)  153/89  06/22/19 136/70  04/27/19 (!) 143/84   UACR: 1.2 BP goal < 140/90 mmHg Patient has failed these meds in the past: denies any failed therapies  Patient checks BP at home: 2-3 days per week Patient home BP readings are ranging: 130s/70s or under Reports BP was elevated for a few days due to anxiety about endoscopy but returned to normal  Patient is currently controlled on the following medications:   Diltiazem 360 mg 24 hr - 1 capsule daily (palpitations - AM)  Metoprolol tartrate 50 mg - 1.5 tablets (75 mg) BID   We discussed: confirmed adherence to above   Plan: Continue current medications  Vaccines   Reviewed and discussed patient's vaccination history.    Immunization History  Administered Date(s) Administered  . Pneumococcal Conjugate-13 12/15/2017  . Pneumococcal Polysaccharide-23 09/15/2012, 12/22/2018    Plan: Recommended patient receive Shingrix, Tdap, and seasonal influenza vaccine.   Medication Management  Pharmacy/Benefits: [facility], [facility]   Adherence: refills pillbox every Saturday   Affordability: no concerns   Vaccines: Recommend Shingrix, Tdap, influenza   CCM Follow Up:  6 months telephone visit   Debbora Dus, PharmD Clinical Pharmacist [facility] [phone]

## 2019-09-18 NOTE — Progress Notes (Signed)
I have collaborated with the care management provider regarding care management and care coordination activities outlined in this encounter and have reviewed this encounter including documentation in the note and care plan. I am certifying that I agree with the content of this note and encounter as supervising physician.  

## 2019-09-24 ENCOUNTER — Ambulatory Visit: Payer: Medicare Other | Admitting: Rheumatology

## 2019-10-15 ENCOUNTER — Encounter: Payer: Self-pay | Admitting: Family Medicine

## 2019-10-15 DIAGNOSIS — E041 Nontoxic single thyroid nodule: Secondary | ICD-10-CM

## 2019-10-20 ENCOUNTER — Telehealth: Payer: Self-pay | Admitting: Rheumatology

## 2019-10-20 NOTE — Telephone Encounter (Signed)
She will require further evaluation prior to sending in a muscle relaxer.

## 2019-10-20 NOTE — Telephone Encounter (Signed)
Patient calling because she is having lower back pain, spasms, pain that hurts for her to turn over at night. Patient states this has been going on for 2 weeks now. Tramadol does not help. Patient requesting a muscle relaxer to be called in. Patient uses [facility] on [address].

## 2019-10-20 NOTE — Telephone Encounter (Signed)
Patient states she is hvaing lower back pain. Patient states that it is a burning pain and it goes around to her hips. Patient states it feels like a muscle spasm along with the burning sensation. Patient states this has been going on for 2 weeks. Patient states she has tried Tramadol and a heating pad for relief. Patient states she has had no relief with tramadol and a slight amount with the heating pad. Patient states she does not recall any injury. Patient is requesting a muscle relaxer. Please advise.

## 2019-10-21 ENCOUNTER — Ambulatory Visit: Payer: Self-pay

## 2019-10-21 ENCOUNTER — Other Ambulatory Visit: Payer: Self-pay

## 2019-10-21 ENCOUNTER — Ambulatory Visit (INDEPENDENT_AMBULATORY_CARE_PROVIDER_SITE_OTHER): Payer: Medicare HMO | Admitting: Physician Assistant

## 2019-10-21 ENCOUNTER — Encounter: Payer: Self-pay | Admitting: Physician Assistant

## 2019-10-21 VITALS — BP 161/91 | HR 75 | Resp 15 | Ht 61.0 in | Wt 198.2 lb

## 2019-10-21 DIAGNOSIS — M8589 Other specified disorders of bone density and structure, multiple sites: Secondary | ICD-10-CM

## 2019-10-21 DIAGNOSIS — M5030 Other cervical disc degeneration, unspecified cervical region: Secondary | ICD-10-CM

## 2019-10-21 DIAGNOSIS — M545 Low back pain, unspecified: Secondary | ICD-10-CM

## 2019-10-21 DIAGNOSIS — Z8679 Personal history of other diseases of the circulatory system: Secondary | ICD-10-CM | POA: Diagnosis not present

## 2019-10-21 DIAGNOSIS — M533 Sacrococcygeal disorders, not elsewhere classified: Secondary | ICD-10-CM

## 2019-10-21 DIAGNOSIS — Z8639 Personal history of other endocrine, nutritional and metabolic disease: Secondary | ICD-10-CM

## 2019-10-21 DIAGNOSIS — G4709 Other insomnia: Secondary | ICD-10-CM | POA: Diagnosis not present

## 2019-10-21 DIAGNOSIS — Z79899 Other long term (current) drug therapy: Secondary | ICD-10-CM

## 2019-10-21 DIAGNOSIS — Z86711 Personal history of pulmonary embolism: Secondary | ICD-10-CM

## 2019-10-21 DIAGNOSIS — M3501 Sicca syndrome with keratoconjunctivitis: Secondary | ICD-10-CM | POA: Diagnosis not present

## 2019-10-21 DIAGNOSIS — M797 Fibromyalgia: Secondary | ICD-10-CM | POA: Diagnosis not present

## 2019-10-21 DIAGNOSIS — E041 Nontoxic single thyroid nodule: Secondary | ICD-10-CM

## 2019-10-21 DIAGNOSIS — Z8659 Personal history of other mental and behavioral disorders: Secondary | ICD-10-CM

## 2019-10-21 DIAGNOSIS — R5383 Other fatigue: Secondary | ICD-10-CM

## 2019-10-21 DIAGNOSIS — N1831 Chronic kidney disease, stage 3a: Secondary | ICD-10-CM

## 2019-10-21 DIAGNOSIS — G8929 Other chronic pain: Secondary | ICD-10-CM | POA: Diagnosis not present

## 2019-10-21 MED ORDER — METHOCARBAMOL 500 MG PO TABS
500.0000 mg | ORAL_TABLET | Freq: Two times a day (BID) | ORAL | 0 refills | Status: DC | PRN
Start: 2019-10-21 — End: 2019-12-28

## 2019-10-21 NOTE — Telephone Encounter (Signed)
Patient scheduled for an appointment 10/21/2019 @ 1:40 pm.

## 2019-10-21 NOTE — Progress Notes (Signed)
Office Visit Note  Patient: Alexandria Leach             Date of Birth: 03-Oct-1950           MRN: [id number]             PCP: Ria Bush, MD Referring: Ria Bush, MD Visit Date: 10/21/2019 Occupation: @GUAROCC @  Subjective:  Low back pain  History of Present Illness: Alexandria Leach is a 69 y.o. female with history of Sjogren's syndrome, fibromyalgia, and DDD.  Patient discontinued Plaquenil in February 2021 due to not noticing any clinical improvement in her symptoms.  She has not developed any new or worsening symptoms since discontinuing Plaquenil.  She has been using Systane eyedrops, Restasis, and Biotene products for symptomatic relief.  Her sicca symptoms have been tolerable. She presents today with pain due to trochanteric bursitis bilaterally and increased lower back pain.  Her discomfort started about 1.5 weeks ago.  She attributes her increased discomfort due to "overdoing it."  She states that she has tried to rest but has not noticed any improvement in her discomfort.  She is having severe muscle spasms and difficulty sleeping at night due to the discomfort.  She continues to take tramadol 50 mg 1 tablet 3 times daily as needed for pain relief and gabapentin as prescribed.  She is taking amitriptyline 25 mg 1 tablet by mouth at bedtime for insomnia.  Activities of Daily Living:  Patient reports morning stiffness for  45-60 minutes.   Patient Reports nocturnal pain.  Difficulty dressing/grooming: Denies Difficulty climbing stairs: Reports Difficulty getting out of chair: Reports Difficulty using hands for taps, buttons, cutlery, and/or writing: Denies  Review of Systems  Constitutional: Positive for fatigue.  HENT: Positive for mouth sores and mouth dryness. Negative for nose dryness.   Eyes: Positive for dryness. Negative for pain and visual disturbance.  Respiratory: Negative for cough, hemoptysis, shortness of breath and difficulty breathing.     Cardiovascular: Negative for chest pain, palpitations, hypertension and swelling in legs/feet.  Gastrointestinal: Negative for blood in stool, constipation and diarrhea.  Endocrine: Negative for increased urination.  Genitourinary: Negative for difficulty urinating and painful urination.  Musculoskeletal: Positive for arthralgias, joint pain and morning stiffness. Negative for joint swelling, myalgias, muscle weakness, muscle tenderness and myalgias.  Skin: Negative for color change, pallor, rash, hair loss, nodules/bumps, redness, skin tightness, ulcers and sensitivity to sunlight.  Allergic/Immunologic: Negative for susceptible to infections.  Neurological: Positive for numbness, headaches and weakness. Negative for dizziness and memory loss.  Hematological: Positive for bruising/bleeding tendency. Negative for swollen glands.  Psychiatric/Behavioral: Negative for depressed mood, confusion and sleep disturbance. The patient is not nervous/anxious.     PMFS History:  Patient Active Problem List   Diagnosis Date Noted  . Acute sinusitis 01/25/2019  . Acute right flank pain 07/16/2018  . Sinus tachycardia 05/27/2018  . Transient alteration of awareness 04/04/2018  . Abnormal serum thyroid stimulating hormone (TSH) level 01/30/2018  . 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[place]) 12/22/2017  . Thyroid nodule 09/16/2017  . Neck pain on right side 05/14/2017  . Sialoadenitis of submandibular gland 10/29/2016  . High risk medication use 06/05/2016  . Peripheral neuropathy 01/30/2016  . DNR no code (do not resuscitate) 12/08/2015  . 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 TIA (transient ischemic attack) 10/12/2015  . Mild mitral regurgitation 10/12/2015  . Osteopenia 09/18/2015  . Right arm pain 08/29/2015  . History of fall   . Right sided sciatica  02/21/2015  . Imbalance 01/12/2015  . Transaminitis 12/24/2014  . DDD (degenerative disc disease), cervical   . Health maintenance  examination 10/18/2014  . Advanced care planning/counseling discussion 10/18/2014  . Medicare annual wellness visit, initial 10/18/2014  . Refusal of blood transfusions as patient is Jehovah's Witness   . Sjogren's syndrome ([place])   . CKD (chronic kidney disease) stage 3, GFR 30-59 ml/min   . Glaucoma   . Fibromyalgia   . Osteoarthritis   . History of pulmonary embolism   . Lung nodule 09/22/2012  . Diastolic CHF ([place]) 10/62/6948  . Aortic stenosis 04/22/2011  . Palpitations 08/10/2010  . HOT FLASHES 06/28/2008  . ARTHRALGIA 07/01/2007  . BACK PAIN, LEFT 08/25/2006  . GAD (generalized anxiety disorder) 03/28/2006  . History of mitral valve repair 03/28/2006  . TAH/BSO, HX OF 03/28/2006  . HYPERCHOLESTEROLEMIA 12/31/2005  . MDD (major depressive disorder), recurrent episode ([place]) 12/31/2005  . RHEUMATIC HEART DISEASE 12/31/2005  . Essential hypertension 12/31/2005  . Chronic insomnia 12/31/2005    Past Medical History:  Diagnosis Date  . Ankle fracture, left 02/19/2015   from a fall  . Anxiety   . Cataract 2014   corrected with surgery  . CHF (congestive heart failure) ([place])   . CKD (chronic kidney disease) stage 3, GFR 30-59 ml/min    saw nephrologist Dr Harden Mo  . DDD (degenerative disc disease), cervical   . Depression   . Fibromyalgia   . Glaucoma    s/p surgery, sees ophtho Q6 mo  . 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 History of kidney stones 2010  . History of pulmonary embolism 2001, 2006   completed coumadin courses  . History of rheumatic fever x3  . HLD (hyperlipidemia)   . HTN (hypertension)   . Insomnia   . Lung nodule 09/22/2012   RLL - 20mm, stable since 2014. Thought benign.   . Osteoarthritis    shoulders and knees, not RA per Dr Estanislado Pandy, positive ANA, positive Ro  . Osteopenia 09/18/2015   DEXA T -1.1 hip, -0.2 spine 08/2015   . Personal history of urinary calculi latest 2014  .  Pneumonia 12/02/2011  . PONV (postoperative nausea and vomiting)   . Refusal of blood transfusions as patient is Jehovah's Witness   . Rheumatic heart disease 1980   s/p mitral valve repair 1980  . Sjogren's syndrome ([place])   . Trimalleolar fracture of left ankle 02/23/2015    Family History  Problem Relation Age of Onset  . Lupus Sister        and niece  . Cancer Mother        lung (nonsmoker)  . CAD Mother        MI in her 71s  . ALS Mother   . Kidney disease Father   . Alcohol abuse Father   . Diabetes Father   . Cancer Brother        bone  . Diabetes Sister   . Stroke Sister   . Cancer Maternal Uncle        bone  . Depression Sister   . Lupus Sister   . Kidney failure Other        on HD  . Diabetes Brother   . Heart attack Brother   . Stroke Maternal Grandmother   . Blindness Sister   . Healthy Son   . Healthy Son   . Healthy Son   .  Healthy Son    Past Surgical History:  Procedure Laterality Date  . BREAST BIOPSY Right 2006   benign  . CHOLECYSTECTOMY  11/27/2011   Procedure: LAPAROSCOPIC CHOLECYSTECTOMY WITH INTRAOPERATIVE CHOLANGIOGRAM;  Surgeon: Adin Hector, MD;  Location: [facility];  Service: General;  Laterality: N/A;  laparoscopic cholecystectomy with choleangiogram umbilical hernia repair  . COLONOSCOPY  07/2014   WNL [facility])  . COLONOSCOPY  08/2019   3 TAs, rpt 3 yrs ([place])  . dexa  08/2012   normal per patient - no records available  . ESOPHAGOGASTRODUODENOSCOPY  08/2019   reactive gastropathy, neg H pylori ([place])  . EYE SURGERY Bilateral 2014   cataract removal  . MITRAL VALVE REPAIR  1980   open heart  . ORIF ANKLE FRACTURE Left 02/26/2015   Procedure: OPEN REDUCTION INTERNAL FIXATION (ORIF) LEFT TRIMALLEOLAR ANKLE FRACTURE;  Surgeon: [name], MD;  Location: [facility];  Service: Orthopedics;  Laterality: Left;  . TUBAL LIGATION  1980  . UMBILICAL HERNIA REPAIR  11/27/2011   Procedure: HERNIA REPAIR UMBILICAL ADULT;  Surgeon: Adin Hector, MD;  Location: [facility];  Service: General;  Laterality: N/A;  . [place]   for fibroids -- partial, ovaries remain   Social History   Social History Narrative   Lives with son, 1 dog   Occupation: unemployed, on disability for fibromyalgia since 2008.   Edu: HS   Religion: Jehova's witness   Activity: volunteers at senior center   Diet: some water, fruits/vegetables daily   No caffeine use   Immunization History  Administered Date(s) Administered  . Pneumococcal Conjugate-13 12/15/2017  . Pneumococcal Polysaccharide-23 09/15/2012, 12/22/2018     Objective: Vital Signs: BP (!) 161/91 (BP Location: Left Arm, Patient Position: Sitting, Cuff Size: Normal)   Pulse 75   Resp 15   Ht 5\' 1"  (1.549 m)   Wt 198 lb 3.2 oz (89.9 kg)   BMI 37.45 kg/m    Physical Exam Vitals and nursing note reviewed.  Constitutional:      Appearance: She is well-developed.  HENT:     Head: Normocephalic and atraumatic.  Eyes:     Conjunctiva/sclera: Conjunctivae normal.  Pulmonary:     Effort: Pulmonary effort is normal.  Abdominal:     General: Bowel sounds are normal.     Palpations: Abdomen is soft.  Musculoskeletal:     Cervical back: Normal range of motion.  Lymphadenopathy:     Cervical: No cervical adenopathy.  Skin:    General: Skin is warm and dry.     Capillary Refill: Capillary refill takes less than 2 seconds.  Neurological:     Mental Status: She is alert and oriented to person, place, and time.  Psychiatric:        Behavior: Behavior normal.      Musculoskeletal Exam: Generalized hyperalgesia and positive tender points.  C-spine good ROM.  Painful ROM of lumbar spine.  Midline spinal tenderness.  Tenderness over both SI joints.  Shoulder joints, elbow joints, wrist joints, MCPs, PIPs, and DIPs good ROM with no synovitis.  She has complete fist formation bilaterally.  DIP thickening consistent with osteoarthritis of both hands noted.  She has limited  range of motion of both hip joints with discomfort.  Tenderness over bilateral trochanteric bursa.  Knee joints have good range of motion with no warmth or effusion.  She has bilateral knee crepitus.  Ankle joints have good range of motion with no tenderness or inflammation.  CDAI Exam: CDAI Score: -- Patient Global: --; Provider Global: -- Swollen: --; Tender: -- Joint Exam 10/21/2019   No joint exam has been documented for this visit   There is currently no information documented on the homunculus. Go to the Rheumatology activity and complete the homunculus joint exam.  Investigation: No additional findings.  Imaging: XR Lumbar Spine 2-3 Views  Result Date: 10/21/2019 Dextroscoliosis was noted.  Anterior spurring was noted.  No significant disc space narrowing was noted.  Facet joint arthropathy was noted in L4-L5 and L5-S1. Impression: These findings are consistent with scoliosis, mild spondylosis and facet joint arthropathy.   XR Pelvis 1-2 Views  Result Date: 10/21/2019 No SI joint sclerosis or narrowing was noted.  Some osteoarthritic changes were noted. Impression: Osteoarthritic changes are noted in the SI joints.   Recent Labs: Lab Results  Component Value Date   WBC 4.1 04/20/2019   HGB 12.1 04/20/2019   PLT 241 04/20/2019   NA 139 04/20/2019   K 4.2 04/20/2019   CL 109 04/20/2019   CO2 21 04/20/2019   GLUCOSE 132 (H) 04/20/2019   BUN 10 04/20/2019   CREATININE 1.28 (H) 04/20/2019   BILITOT 0.4 04/20/2019   ALKPHOS 71 07/16/2018   AST 26 04/20/2019   ALT 15 04/20/2019   PROT 7.1 04/20/2019   ALBUMIN 4.2 12/15/2018   CALCIUM 9.7 04/20/2019   GFRAA 50 (L) 04/20/2019    Speciality Comments: PLQ Eye Exam: 08/03/2018 WNL @ [facility] Follow up in 1 year  Procedures:  No procedures performed Allergies: Cymbalta [duloxetine hcl], Statins, and Sulfa antibiotics   Assessment / Plan:     Visit Diagnoses: Sjogren's syndrome with keratoconjunctivitis sicca (HCC)  - +ANA, +Ro: Her sicca symptoms have been tolerable with the use of Restasis eyedrops, Systane eyedrops, and Biotene products.  She discontinued Plaquenil in February 2021 due to an inadequate response.  She has not noticed any new or worsening symptoms since discontinuing Plaquenil.  She has not had any other clinical features of autoimmune disease.  No signs of inflammatory arthritis noted.  She does not require immunosuppressive therapy at this time.  She was advised to notify us if she develops any new or worsening symptoms.  High risk medication use - Discontinued Plaquenil in February 2021.  Fibromyalgia: She has generalized hyperalgesia and positive tender points on exam.  She is currently having a fibromyalgia flare.  She has been experiencing increased generalized myalgias for the past 1.5 weeks.  She has been trying to rest but her discomfort has been persistent.  She has been taking tramadol 50 mg 1 tablet 3 times daily as needed and gabapentin as prescribed.  She presents today with severe lower back pain and muscle spasms.  X-rays of the lumbar spine and pelvis were obtained today.  A prescription for Robaxin 500 mg twice daily as needed for 7 days was sent to the pharmacy today.  We will also refer her to physical therapy.  Other insomnia: She takes amitriptyline 25 mg 1 tablet by mouth at bedtime for insomnia.  Other chronic pain - Tramadol 50 mg 1 tablet 3 times daily as needed for pain relief.  UDS was updated on 08/17/2019.  Other fatigue: She is experiencing increased fatigue since her fibromyalgia has been flaring.  We discussed the importance of regular exercise and good sleep hygiene.  DDD (degenerative disc disease), cervical: She has good range of motion on exam today.  No symptoms of radiculopathy.  Chronic midline low  back pain without sciatica -She presents today with increased lower back pain which started 1.5 weeks ago.  She has not had any recent injuries or falls.  She has  been trying to rest but has not had any relief.  She has been having frequent muscles spasms.  She has been taking tramadol 50 mg 1 tablet 3 times daily as needed for pain relief which has not been alleviating her discomfort.  She has been experiencing significant nocturnal pain.  She is not experiencing any symptoms of radiculopathy at this time.  She has painful range of motion of the lumbar spine on exam.  Midline spinal tenderness and CMC joint tenderness noted bilaterally.  X-rays of the lumbar and pelvis were obtained today.  A referral to physical therapy will be placed today.  A prescription for Robaxin 500 mg twice daily as needed for 7 days will be sent to the pharmacy.  She was advised to notify us if her symptoms persist or worsen.  Plan: XR Pelvis 1-2 Views, XR Lumbar Spine 2-3 Views  Chronic SI joint pain -She has tenderness to palpation over bilateral SI joints.  She has generalized hyperalgesia on exam today.  X-rays of the pelvis were obtained which did not reveal any joint space narrowing or sclerosis.  She will be referred to physical therapy today.  Plan: XR Pelvis 1-2 Views  Osteopenia of multiple sites: DEXA 09/14/2015 BMD left FN is 0.891 with T score -1.1.  She is due to update her bone density and will further discuss with her PCP.  Other medical conditions are listed as follows:  History of pulmonary embolism  History of hypertension  Stage 3a chronic kidney disease  History of anxiety  History of hypercholesterolemia  Thyroid nodule    Orders: Orders Placed This Encounter  Procedures  . XR Pelvis 1-2 Views  . XR Lumbar Spine 2-3 Views   No orders of the defined types were placed in this encounter.   Face-to-face time spent with patient was 30 minutes. Greater than 50% of time was spent in counseling and coordination of care.  Follow-Up Instructions: Return in 6 months (on 04/22/2020) for Sjogren's syndrome, Fibromyalgia, DDD.   Ofilia Neas, PA-C  Note -  This record has been created using Dragon software.  Chart creation errors have been sought, but may not always  have been located. Such creation errors do not reflect on  the standard of medical care.

## 2019-10-22 NOTE — Telephone Encounter (Signed)
Noted  

## 2019-10-25 ENCOUNTER — Other Ambulatory Visit: Payer: Self-pay

## 2019-10-25 ENCOUNTER — Ambulatory Visit
Admission: RE | Admit: 2019-10-25 | Discharge: 2019-10-25 | Disposition: A | Discharge status: Home / Self Care | Payer: Medicare HMO | Source: Ambulatory Visit | Attending: Family Medicine | Admitting: Family Medicine

## 2019-10-25 DIAGNOSIS — Z1231 Encounter for screening mammogram for malignant neoplasm of breast: Secondary | ICD-10-CM

## 2019-10-25 LAB — HM MAMMOGRAPHY

## 2019-10-26 ENCOUNTER — Encounter: Payer: Self-pay | Admitting: Family Medicine

## 2019-10-27 ENCOUNTER — Ambulatory Visit
Admission: RE | Admit: 2019-10-27 | Discharge: 2019-10-27 | Disposition: A | Discharge status: Home / Self Care | Payer: Medicare HMO | Source: Ambulatory Visit | Attending: Family Medicine | Admitting: Family Medicine

## 2019-10-27 DIAGNOSIS — E041 Nontoxic single thyroid nodule: Secondary | ICD-10-CM | POA: Diagnosis not present

## 2019-10-28 ENCOUNTER — Other Ambulatory Visit: Payer: Self-pay | Admitting: Cardiology

## 2019-10-28 NOTE — Telephone Encounter (Signed)
*  STAT* If patient is at the pharmacy, call can be transferred to refill team.   1. Which medications need to be refilled? (please list name of each medication and dose if known)  aspirin 81 MG tablet diltiazem (CARDIZEM CD) 360 MG 24 hr capsule 2. Which pharmacy/location (including street and city if local pharmacy) is medication to be sent to? [place]   3. Do they need a 30 day or 90 day supply? 90 day supply

## 2019-10-29 MED ORDER — DILTIAZEM HCL ER COATED BEADS 360 MG PO CP24: 360.0000 mg | ORAL_CAPSULE | Freq: Every day | ORAL | 3 refills | Status: DC

## 2019-10-29 MED ORDER — ASPIRIN EC 81 MG PO TBEC: 81.0000 mg | DELAYED_RELEASE_TABLET | Freq: Every day | ORAL | Status: DC

## 2019-10-29 NOTE — Telephone Encounter (Signed)
Refills sent to pharmacy. 

## 2019-11-02 ENCOUNTER — Telehealth: Payer: Self-pay | Admitting: Family Medicine

## 2019-11-02 NOTE — Telephone Encounter (Signed)
Spoke with pt to confirm mail order pharmacy change.  Denies she is using [facility] and she has informed them she did not sign up for their service.  However, her insurance has changed.  So pt no longer uses [facility] or [facility].  She now uses [facility] and [facility].  States she does not need any refills at this time.  She will contact the pharmacy when she does.

## 2019-11-02 NOTE — Telephone Encounter (Signed)
Alexandria Leach called to get refill request Famotidine Lasix zetia

## 2019-11-03 ENCOUNTER — Other Ambulatory Visit: Payer: Self-pay

## 2019-11-03 ENCOUNTER — Ambulatory Visit (HOSPITAL_COMMUNITY): Payer: Medicare HMO | Attending: Cardiovascular Disease

## 2019-11-03 DIAGNOSIS — Z9889 Other specified postprocedural states: Secondary | ICD-10-CM | POA: Diagnosis not present

## 2019-11-03 DIAGNOSIS — I342 Nonrheumatic mitral (valve) stenosis: Secondary | ICD-10-CM

## 2019-11-03 DIAGNOSIS — I340 Nonrheumatic mitral (valve) insufficiency: Secondary | ICD-10-CM | POA: Insufficient documentation

## 2019-11-03 LAB — ECHOCARDIOGRAM COMPLETE
AR max vel: 1.15 cm2
AV Area VTI: 1.1 cm2
AV Area mean vel: 1.15 cm2
AV Mean grad: 11 mmHg
AV Peak grad: 19.9 mmHg
Ao pk vel: 2.23 m/s
Area-P 1/2: 2.56 cm2
P 1/2 time: 102 msec
P 1/2 time: 327 msec
S' Lateral: 2.3 cm

## 2019-11-03 MED ORDER — PERFLUTREN LIPID MICROSPHERE
1.0000 mL | INTRAVENOUS | Status: AC | PRN
  Administered 2019-11-03: 2 mL via INTRAVENOUS

## 2019-11-08 NOTE — Progress Notes (Signed)
HPI: FU mitral valve repair secondary to rheumatic heart disease. Patient is status post mitral valve repair in 1980. Operative report not available. Nuclear study 11/14 showed EF 66 and normal perfusion. Holter 11/14 showed sinus with pacs, pvcs and brief PAT. Carotid Dopplers October 2019 showed no significant obstruction.    Echocardiogram August 2021 showed normal LV function, grade 2 diastolic dysfunction, severe left atrial enlargement, moderate mitral stenosis, moderate mitral regurgitation, mild aortic stenosis, mild aortic insufficiency and moderate pulmonary hypertension.  Since last seen,she has dyspnea on exertion and occasional mild pedal edema unchanged.  No chest pain or syncope.  She continues to have occasional palpitations described as a skip but not sustained.  No syncope.  Current Outpatient Medications  Medication Sig Dispense Refill  . ALPRAZolam (XANAX) 0.5 MG tablet Take 1 tablet (0.5 mg total) by mouth 2 (two) times daily. 180 tablet 2  . amitriptyline (ELAVIL) 25 MG tablet Take 1 tablet (25 mg total) by mouth at bedtime. 90 tablet 2  . antiseptic oral rinse (BIOTENE) LIQD 15 mLs by Mouth Rinse route at bedtime.     Marland Kitchen aspirin EC 81 MG tablet Take 1 tablet (81 mg total) by mouth daily. 30 tablet   . cholecalciferol (VITAMIN D) 1000 UNITS tablet Take 1,000 Units by mouth daily.     Marland Kitchen Co-Enzyme Q-10 100 MG CAPS Take 1 capsule (100 mg total) by mouth daily.  0  . diclofenac sodium (VOLTAREN) 1 % GEL Apply 1 application topically 2 (two) times daily as needed (pain).    Marland Kitchen diltiazem (CARDIZEM CD) 360 MG 24 hr capsule Take 1 capsule (360 mg total) by mouth daily. 90 capsule 3  . escitalopram (LEXAPRO) 20 MG tablet Take 1 tablet (20 mg total) by mouth 2 (two) times daily. 180 tablet 2  . ezetimibe (ZETIA) 10 MG tablet TAKE 1 TABLET BY MOUTH AT  BEDTIME 90 tablet 3  . famotidine (PEPCID) 20 MG tablet TAKE 1 TABLET BY MOUTH TWO  TIMES DAILY 180 tablet 3  . furosemide (LASIX)  20 MG tablet TAKE 1 TABLET BY MOUTH  DAILY AS NEEDED FOR EDEMA 90 tablet 3  . gabapentin (NEURONTIN) 300 MG capsule TAKE 2 CAPSULES BY MOUTH  EVERY MORNING, 1 CAPSULE  EVERY AFTERNOON AND 2  CAPSULES AT BEDTIME. 450 capsule 3  . lovastatin (MEVACOR) 10 MG tablet TAKE 1 TABLET BY MOUTH  EVERY MONDAY, WEDNESDAY,  AND FRIDAY 39 tablet 3  . methocarbamol (ROBAXIN) 500 MG tablet Take 1 tablet (500 mg total) by mouth 2 (two) times daily as needed for muscle spasms. 14 tablet 0  . Polyethyl Glycol-Propyl Glycol (SYSTANE) 0.4-0.3 % GEL Place 1 drop into both eyes 2 (two) times daily.     . RESTASIS 0.05 % ophthalmic emulsion INSTILL ONE DROP IN BOTH  EYES TWO TIMES DAILY 180 each 0  . traMADol (ULTRAM) 50 MG tablet Take 1 tablet (50 mg total) by mouth 3 (three) times daily. 60 tablet 0  . metoprolol tartrate (LOPRESSOR) 50 MG tablet Take 1.5 tablets (75 mg total) by mouth 2 (two) times daily. 270 tablet 3   No current facility-administered medications for this visit.     Past Medical History:  Diagnosis Date  . Ankle fracture, left 02/19/2015   from a fall  . Anxiety   . Cataract 2014   corrected with surgery  . CHF (congestive heart failure) ([place])   . CKD (chronic kidney disease) stage 3, GFR 30-59 ml/min  saw nephrologist Dr Harden Mo  . DDD (degenerative disc disease), cervical   . Depression   . Fibromyalgia   . Glaucoma    s/p surgery, sees ophtho Q6 mo  . 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 History of kidney stones 2010  . History of pulmonary embolism 2001, 2006   completed coumadin courses  . History of rheumatic fever x3  . HLD (hyperlipidemia)   . HTN (hypertension)   . Insomnia   . Lung nodule 09/22/2012   RLL - 69mm, stable since 2014. Thought benign.   . Osteoarthritis    shoulders and knees, not RA per Dr Estanislado Pandy, positive ANA, positive Ro  . Osteopenia 09/18/2015   DEXA T -1.1 hip, -0.2 spine 08/2015    . Personal history of urinary calculi latest 2014  . Pneumonia 12/02/2011  . PONV (postoperative nausea and vomiting)   . Refusal of blood transfusions as patient is Jehovah's Witness   . Rheumatic heart disease 1980   s/p mitral valve repair 1980  . Sjogren's syndrome ([place])   . Trimalleolar fracture of left ankle 02/23/2015    Past Surgical History:  Procedure Laterality Date  . BREAST BIOPSY Right 2006   benign  . CHOLECYSTECTOMY  11/27/2011   Procedure: LAPAROSCOPIC CHOLECYSTECTOMY WITH INTRAOPERATIVE CHOLANGIOGRAM;  Surgeon: Adin Hector, MD;  Location: [facility];  Service: General;  Laterality: N/A;  laparoscopic cholecystectomy with choleangiogram umbilical hernia repair  . COLONOSCOPY  07/2014   WNL Amedeo Plenty)  . COLONOSCOPY  08/2019   3 TAs, rpt 3 yrs ([place])  . dexa  08/2012   normal per patient - no records available  . ESOPHAGOGASTRODUODENOSCOPY  08/2019   reactive gastropathy, neg H pylori ([place])  . EYE SURGERY Bilateral 2014   cataract removal  . MITRAL VALVE REPAIR  1980   open heart  . ORIF ANKLE FRACTURE Left 02/26/2015   Procedure: OPEN REDUCTION INTERNAL FIXATION (ORIF) LEFT TRIMALLEOLAR ANKLE FRACTURE;  Surgeon: [name], MD;  Location: [facility];  Service: Orthopedics;  Laterality: Left;  . TUBAL LIGATION  1980  . UMBILICAL HERNIA REPAIR  11/27/2011   Procedure: HERNIA REPAIR UMBILICAL ADULT;  Surgeon: Adin Hector, MD;  Location: [place];  Service: General;  Laterality: N/A;  . [place]   for fibroids -- partial, ovaries remain    Social History   Socioeconomic History  . Marital status: Married    Spouse name: [name]  . Number of children: 4  . Years of education: 52  . Highest education level: Not on file  Occupational History    Employer: UNEMPLOYED    Comment: Disability  Tobacco Use  . Smoking status: Never Smoker  . Smokeless tobacco: Never Used  Vaping Use  . Vaping Use: Never used  Substance and Sexual Activity   . Alcohol use: No    Alcohol/week: 0.0 standard drinks  . Drug use: No  . Sexual activity: Not Currently    Birth control/protection: Surgical  Other Topics Concern  . Not on file  Social History Narrative   Lives with son, 1 dog   Occupation: unemployed, on disability for fibromyalgia since 2008.   Edu: HS   Religion: Jehova's witness   Activity: volunteers at senior center   Diet: some water, fruits/vegetables daily   No caffeine use   Social Determinants of Health   Financial Resource Strain: Low Risk   . Difficulty of Paying  Living Expenses: Not hard at all  Food Insecurity: No Food Insecurity  . Worried About Charity fundraiser in the Last Year: Never true  . Ran Out of Food in the Last Year: Never true  Transportation Needs: No Transportation Needs  . Lack of Transportation (Medical): No  . Lack of Transportation (Non-Medical): No  Physical Activity: Inactive  . Days of Exercise per Week: 0 days  . Minutes of Exercise per Session: 0 min  Stress: Stress Concern Present  . Feeling of Stress : To some extent  Social Connections:   . Frequency of Communication with Friends and Family: Not on file  . Frequency of Social Gatherings with Friends and Family: Not on file  . Attends Religious Services: Not on file  . Active Member of Clubs or Organizations: Not on file  . Attends Archivist Meetings: Not on file  . Marital Status: Not on file  Intimate Partner Violence: Not At Risk  . Fear of Current or Ex-Partner: No  . Emotionally Abused: No  . Physically Abused: No  . Sexually Abused: No    Family History  Problem Relation Age of Onset  . Lupus Sister        and niece  . Cancer Mother        lung (nonsmoker)  . CAD Mother        MI in her 36s  . ALS Mother   . Kidney disease Father   . Alcohol abuse Father   . Diabetes Father   . Cancer Brother        bone  . Diabetes Sister   . Stroke Sister   . Cancer Maternal Uncle        bone  .  Depression Sister   . Lupus Sister   . Kidney failure Other        on HD  . Diabetes Brother   . Heart attack Brother   . Stroke Maternal Grandmother   . Blindness Sister   . Healthy Son   . Healthy Son   . Healthy Son   . Healthy Son     ROS: no fevers or chills, productive cough, hemoptysis, dysphasia, odynophagia, melena, hematochezia, dysuria, hematuria, rash, seizure activity, orthopnea, PND, pedal edema, claudication. Remaining systems are negative.  Physical Exam: Well-developed well-nourished in no acute distress.  Skin is warm and dry.  HEENT is normal.  Neck is supple.  Chest is clear to auscultation with normal expansion.  Cardiovascular exam is regular rate and rhythm.  Abdominal exam nontender or distended. No masses palpated. Extremities show no edema. neuro grossly intact  ECG-normal sinus rhythm at a rate of 95, nonspecific ST changes.  Personally reviewed  A/P  1 prior mitral valve repair-continue SBE prophylaxis.  2 mitral stenosis/mitral regurgitation and aortic stenosis/aortic insufficiency-patient will need follow-up echocardiogram August 2022.  3 chronic diastolic congestive heart failure-patient appears to be euvolemic today on examination.  Continue Lasix at present dose.  Check potassium and renal function.  4 hypertension-blood pressure is elevated; increase Cardizem to 480 mg daily and follow.  5 hyperlipidemia-continue statin.  6 palpitations-symptoms sound likely to be PACs or PVCs as they are not sustained.  If they worsen we will plan monitor in the future.  Continue beta-blocker and Cardizem.  Kirk Ruths, MD

## 2019-11-10 ENCOUNTER — Telehealth: Payer: Self-pay | Admitting: Rheumatology

## 2019-11-10 MED ORDER — TRAMADOL HCL 50 MG PO TABS: 50.0000 mg | ORAL_TABLET | Freq: Three times a day (TID) | ORAL | 0 refills | Status: DC

## 2019-11-10 NOTE — Telephone Encounter (Signed)
Last Visit: 10/21/2019 Next Visit: 02/14/2020 UDS: 08/17/2019 Narc Agreement: 08/17/2019  Last Fill: 08/17/2019  Patient is requesting the tramadol to be sent to the local pharmacy instead of [facility].   Okay to refill tramadol?

## 2019-11-10 NOTE — Telephone Encounter (Signed)
Patient called requesting prescription refill of Tramadol to be sent to [facility] at [address] in [place].  Patient states she is out of medication and needs the prescription sent to her local pharmacy.

## 2019-11-11 ENCOUNTER — Emergency Department (HOSPITAL_COMMUNITY)
Admission: EM | Admit: 2019-11-11 | Discharge: 2019-11-12 | Disposition: A | Discharge status: Home / Self Care | Payer: Medicare HMO | Attending: Emergency Medicine | Admitting: Emergency Medicine

## 2019-11-11 ENCOUNTER — Other Ambulatory Visit: Payer: Self-pay

## 2019-11-11 ENCOUNTER — Encounter (HOSPITAL_COMMUNITY): Payer: Self-pay | Admitting: *Deleted

## 2019-11-11 ENCOUNTER — Telehealth: Payer: Self-pay | Admitting: Family Medicine

## 2019-11-11 DIAGNOSIS — M545 Low back pain: Secondary | ICD-10-CM | POA: Insufficient documentation

## 2019-11-11 DIAGNOSIS — N289 Disorder of kidney and ureter, unspecified: Secondary | ICD-10-CM | POA: Diagnosis not present

## 2019-11-11 DIAGNOSIS — R1030 Lower abdominal pain, unspecified: Secondary | ICD-10-CM | POA: Diagnosis not present

## 2019-11-11 DIAGNOSIS — Z79899 Other long term (current) drug therapy: Secondary | ICD-10-CM | POA: Insufficient documentation

## 2019-11-11 DIAGNOSIS — Z7982 Long term (current) use of aspirin: Secondary | ICD-10-CM | POA: Diagnosis not present

## 2019-11-11 DIAGNOSIS — I5030 Unspecified diastolic (congestive) heart failure: Secondary | ICD-10-CM | POA: Insufficient documentation

## 2019-11-11 DIAGNOSIS — N200 Calculus of kidney: Secondary | ICD-10-CM | POA: Diagnosis not present

## 2019-11-11 DIAGNOSIS — R1032 Left lower quadrant pain: Secondary | ICD-10-CM | POA: Diagnosis not present

## 2019-11-11 DIAGNOSIS — I700 Atherosclerosis of aorta: Secondary | ICD-10-CM | POA: Diagnosis not present

## 2019-11-11 DIAGNOSIS — I130 Hypertensive heart and chronic kidney disease with heart failure and stage 1 through stage 4 chronic kidney disease, or unspecified chronic kidney disease: Secondary | ICD-10-CM | POA: Insufficient documentation

## 2019-11-11 DIAGNOSIS — N183 Chronic kidney disease, stage 3 unspecified: Secondary | ICD-10-CM | POA: Diagnosis not present

## 2019-11-11 DIAGNOSIS — N261 Atrophy of kidney (terminal): Secondary | ICD-10-CM | POA: Diagnosis not present

## 2019-11-11 DIAGNOSIS — K429 Umbilical hernia without obstruction or gangrene: Secondary | ICD-10-CM | POA: Diagnosis not present

## 2019-11-11 LAB — CBC
HCT: 39.7 % (ref 36.0–46.0)
Hemoglobin: 12.1 g/dL (ref 12.0–15.0)
MCH: 26.8 pg (ref 26.0–34.0)
MCHC: 30.5 g/dL (ref 30.0–36.0)
MCV: 87.8 fL (ref 80.0–100.0)
Platelets: 234 10*3/uL (ref 150–400)
RBC: 4.52 MIL/uL (ref 3.87–5.11)
RDW: 14.4 % (ref 11.5–15.5)
WBC: 5 10*3/uL (ref 4.0–10.5)
nRBC: 0 % (ref 0.0–0.2)

## 2019-11-11 LAB — COMPREHENSIVE METABOLIC PANEL
ALT: 17 U/L (ref 0–44)
AST: 33 U/L (ref 15–41)
Albumin: 4.2 g/dL (ref 3.5–5.0)
Alkaline Phosphatase: 57 U/L (ref 38–126)
Anion gap: 9 (ref 5–15)
BUN: 9 mg/dL (ref 8–23)
CO2: 23 mmol/L (ref 22–32)
Calcium: 9.9 mg/dL (ref 8.9–10.3)
Chloride: 108 mmol/L (ref 98–111)
Creatinine, Ser: 1.26 mg/dL — ABNORMAL HIGH (ref 0.44–1.00)
GFR calc Af Amer: 51 mL/min — ABNORMAL LOW (ref >60)
GFR calc non Af Amer: 44 mL/min — ABNORMAL LOW (ref >60)
Glucose, Bld: 80 mg/dL (ref 70–99)
Potassium: 4 mmol/L (ref 3.5–5.1)
Sodium: 140 mmol/L (ref 135–145)
Total Bilirubin: 0.6 mg/dL (ref 0.3–1.2)
Total Protein: 7.4 g/dL (ref 6.5–8.1)

## 2019-11-11 LAB — URINALYSIS, ROUTINE W REFLEX MICROSCOPIC
Bacteria, UA: NONE SEEN
Bilirubin Urine: NEGATIVE
Glucose, UA: NEGATIVE mg/dL
Hgb urine dipstick: NEGATIVE
Ketones, ur: NEGATIVE mg/dL
Nitrite: NEGATIVE
Protein, ur: NEGATIVE mg/dL
Specific Gravity, Urine: 1.014 (ref 1.005–1.030)
pH: 7 (ref 5.0–8.0)

## 2019-11-11 LAB — LIPASE, BLOOD: Lipase: 24 U/L (ref 11–51)

## 2019-11-11 NOTE — Telephone Encounter (Signed)
I spoke with pt; pt still in severe pain and pt will go to [facility] ED now.

## 2019-11-11 NOTE — Telephone Encounter (Signed)
Unable to reach pt by phone;tried multiple #s; home phone is not valid # and cell # has no v/m; sending note to Dr Darnell Level, Dr Tomi Bamberger LPN .

## 2019-11-11 NOTE — Telephone Encounter (Signed)
Pt called in and said she is having severe pains in stomach that starting in her back. She said she can hardly lift her legs. Transferred patient to access nurse for triage.

## 2019-11-11 NOTE — ED Triage Notes (Signed)
Pt is here with abdominal pain that has been getting worse since Monday.  Pain is in lower abdomen (most in LLQ) with radiation into flanks. No fever, no n/v/d. LBM yesterday, no pain or burning with urination

## 2019-11-11 NOTE — Telephone Encounter (Signed)
[facility] - Client TELEPHONE ADVICE RECORD [facility] Patient Name: Alexandria Leach Gender: Female DOB: 1950-10-14 Age: 69 Y 56 M 17 D Return Phone Number: [phone] (Primary), [phone] (Secondary), [phone] (Alternate) Address[place] [address] Client [facility] - Client Client Site [place] - Day Physician Ria Bush - MD Contact Type Call Who Is Calling Patient / Member / Family / Caregiver Call Type Triage / Clinical Relationship To Patient Self Return Phone Number [phone] (Alternate) Chief Complaint Abdominal Pain Reason for Call Symptomatic / Request for [place] states she has abdominal pain and back pain. [place] Not Listed Possibly [facility] depending on wait Translation No Nurse Assessment Nurse: Claiborne Billings, RN, Maudie Mercury Date/Time (Eastern Time): 11/11/2019 10:31:46 AM Confirm and document reason for call. If symptomatic, describe symptoms. ---Caller states she has back and abdominal pain. States she saw rheumatologist last week who did x rays for ongoing back pain and told caller she has arthritis in her back. States her "spine is twisted". States abdominal pain started yesterday, pain is over entire abdomen and current pain level is 10/10 with movement, less when sitting still. Has the patient had close contact with a person known or suspected to have the novel coronavirus illness OR traveled / lives in area with major community spread (including international travel) in the last 14 days from the onset of symptoms? * If Asymptomatic, screen for exposure and travel within the last 14 days. ---No Does the patient have any new or worsening symptoms? ---Yes Will a triage be completed? ---Yes Related visit to physician within the last 2 weeks? ---Yes Does the PT have any chronic conditions? (i.e. diabetes, asthma,  this includes High risk factors for pregnancy, etc.) ---Yes List chronic conditions. ---Fibromyalgia, Sjogrens, CHF, Arthritis, CBP Is this a behavioral health or substance abuse call? ---No Guidelines Guideline Title Affirmed Question Affirmed Notes Nurse Date/Time (Eastern Time) Abdominal Pain - Female [1] SEVERE pain (e.g., excruciating) AND [2] present > 1 hour Claiborne Billings, RN, Maudie Mercury 11/11/2019 10:34:47 AM PLEASE NOTE: All timestamps contained within this report are represented as [place] Standard Time. CONFIDENTIALTY NOTICE: This fax transmission is intended only for the addressee. It contains information that is legally privileged, confidential or otherwise protected from use or disclosure. If you are not the intended recipient, you are strictly prohibited from reviewing, disclosing, copying using or disseminating any of this information or taking any action in reliance on or regarding this information. If you have received this fax in error, please notify us immediately by telephone so that we can arrange for its return to [place]. Phone: [phone], Toll-Free: [phone], Fax: [phone] Page: 2 of 2 Call Id: [id number] [place]. Time Eilene Ghazi Time) Disposition Final User 11/11/2019 10:08:19 AM Attempt made - no message left Suzette Battiest 11/11/2019 10:10:05 AM Send To RN Personal Claiborne Billings, RN, Kim 11/11/2019 10:18:58 AM Attempt made - no message left Claiborne Billings, RN, Maudie Mercury 11/11/2019 10:19:55 AM Send To RN Personal Claiborne Billings, RN, Kim 11/11/2019 10:35:52 AM Go to ED Now Yes Claiborne Billings, RN, Max Sane Disagree/Comply Comply Caller Understands Yes PreDisposition InappropriateToAsk Care Advice Given Per Guideline GO TO ED NOW: * You need to be seen in the Emergency Department. * Go to the ED at ___________ [place] now. Drive carefully. ANOTHER ADULT SHOULD DRIVE: * It is better and safer if another adult drives instead of you. BRING MEDICINES: * Please bring a list of your current medicines  when you go to  the Emergency Department (ER). NOTHING BY MOUTH: * Do not eat or drink anything for now. CARE ADVICE given per Abdominal Pain, Female (Adult) guideline. Comments User: Suezanne Jacquet, RN Date/Time Eilene Ghazi Time): 11/11/2019 10:09:37 AM Primary contact number will not connect when call returned or when manually dialed. Secondary and alternate contact numbers say they are not working numbers. Will have PC check numbers for accuracy. User: Suezanne Jacquet, RN Date/Time Eilene Ghazi Time): 11/11/2019 10:19:43 AM Attempted all 3 numbers again: 1st number will not connect, second number states has been disconnected, 3rd number states not a working number. Still waiting for PC to verify numbers. User: Suezanne Jacquet, RN Date/Time Eilene Ghazi Time): 11/11/2019 10:30:53 AM PC provided additional number, cell # [phone], which was given by caller. Referrals GO TO FACILITY OTHER - SPECIFY

## 2019-11-11 NOTE — Telephone Encounter (Signed)
Agree with in person evaluation today if 10/10 abd pain.  Can we try to reach pt later today for an update?

## 2019-11-11 NOTE — Telephone Encounter (Signed)
Please see if you can try to get up with patient later today.  Agree with eval today (ER if needed) for severe abd pain.

## 2019-11-12 ENCOUNTER — Emergency Department (HOSPITAL_COMMUNITY): Payer: Medicare HMO

## 2019-11-12 DIAGNOSIS — N261 Atrophy of kidney (terminal): Secondary | ICD-10-CM | POA: Diagnosis not present

## 2019-11-12 DIAGNOSIS — N200 Calculus of kidney: Secondary | ICD-10-CM | POA: Diagnosis not present

## 2019-11-12 DIAGNOSIS — I700 Atherosclerosis of aorta: Secondary | ICD-10-CM | POA: Diagnosis not present

## 2019-11-12 DIAGNOSIS — N289 Disorder of kidney and ureter, unspecified: Secondary | ICD-10-CM | POA: Diagnosis not present

## 2019-11-12 DIAGNOSIS — K429 Umbilical hernia without obstruction or gangrene: Secondary | ICD-10-CM | POA: Diagnosis not present

## 2019-11-12 DIAGNOSIS — R1032 Left lower quadrant pain: Secondary | ICD-10-CM | POA: Diagnosis not present

## 2019-11-12 NOTE — ED Provider Notes (Signed)
[facility] DEPART[facility] Provider Note   CSN: [id number] Arrival date & time: 11/11/19  1458   History Chief Complaint  Patient presents with  . Abdominal Pain    Alexandria Leach is a 69 y.o. female.  The history is provided by the patient.  Abdominal Pain Sounds history of hypertension, hyperlipidemia, chronic kidney disease, fibromyalgia and comes in because back pain radiating to the lower abdomen.  Back pain started about 3 days ago.  She saw her rheumatologist who gave her some pain medication.  Pain started radiating around to both suprapubic areas, and her rheumatologist suggested she come to the emergency department.  She denies nausea or vomiting.  She denies constipation or diarrhea.  She denies any urinary difficulty.  Nothing made the pain worse, heating pad seem to make it better.  Pain was rated at 10/10, but has subsided and is now only at 4/10.  Past Medical History:  Diagnosis Date  . Ankle fracture, left 02/19/2015   from a fall  . Anxiety   . Cataract 2014   corrected with surgery  . CHF (congestive heart failure) ([place])   . CKD (chronic kidney disease) stage 3, GFR 30-59 ml/min    saw nephrologist Dr Harden Mo  . DDD (degenerative disc disease), cervical   . Depression   . Fibromyalgia   . Glaucoma    s/p surgery, sees ophtho Q6 mo  . 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 History of kidney stones 2010  . History of pulmonary embolism 2001, 2006   completed coumadin courses  . History of rheumatic fever x3  . HLD (hyperlipidemia)   . HTN (hypertension)   . Insomnia   . Lung nodule 09/22/2012   RLL - 63mm, stable since 2014. Thought benign.   . Osteoarthritis    shoulders and knees, not RA per Dr Estanislado Pandy, positive ANA, positive Ro  . Osteopenia 09/18/2015   DEXA T -1.1 hip, -0.2 spine 08/2015   . Personal history of urinary calculi latest 2014  . Pneumonia  12/02/2011  . PONV (postoperative nausea and vomiting)   . Refusal of blood transfusions as patient is Jehovah's Witness   . Rheumatic heart disease 1980   s/p mitral valve repair 1980  . Sjogren's syndrome ([place])   . Trimalleolar fracture of left ankle 02/23/2015    Patient Active Problem List   Diagnosis Date Noted  . Acute sinusitis 01/25/2019  . Acute right flank pain 07/16/2018  . Sinus tachycardia 05/27/2018  . Transient alteration of awareness 04/04/2018  . Abnormal serum thyroid stimulating hormone (TSH) level 01/30/2018  . 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[place]) 12/22/2017  . Thyroid nodule 09/16/2017  . Neck pain on right side 05/14/2017  . Sialoadenitis of submandibular gland 10/29/2016  . High risk medication use 06/05/2016  . Peripheral neuropathy 01/30/2016  . DNR no code (do not resuscitate) 12/08/2015  . 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 TIA (transient ischemic attack) 10/12/2015  . Mild mitral regurgitation 10/12/2015  . Osteopenia 09/18/2015  . Right arm pain 08/29/2015  . History of fall   . Right sided sciatica 02/21/2015  . Imbalance 01/12/2015  . Transaminitis 12/24/2014  . DDD (degenerative disc disease), cervical   . Health maintenance examination 10/18/2014  . Advanced care planning/counseling discussion 10/18/2014  . Medicare annual wellness visit, initial 10/18/2014  . Refusal of blood transfusions as patient is Jehovah's Witness   . Sjogren's  syndrome ([place])   . CKD (chronic kidney disease) stage 3, GFR 30-59 ml/min   . Glaucoma   . Fibromyalgia   . Osteoarthritis   . History of pulmonary embolism   . Lung nodule 09/22/2012  . Diastolic CHF ([place]) 37/12/6267  . Aortic stenosis 04/22/2011  . Palpitations 08/10/2010  . HOT FLASHES 06/28/2008  . ARTHRALGIA 07/01/2007  . BACK PAIN, LEFT 08/25/2006  . GAD (generalized anxiety disorder) 03/28/2006  . History of mitral valve repair 03/28/2006  . TAH/BSO, HX OF 03/28/2006  .  HYPERCHOLESTEROLEMIA 12/31/2005  . MDD (major depressive disorder), recurrent episode ([place]) 12/31/2005  . RHEUMATIC HEART DISEASE 12/31/2005  . Essential hypertension 12/31/2005  . Chronic insomnia 12/31/2005    Past Surgical History:  Procedure Laterality Date  . BREAST BIOPSY Right 2006   benign  . CHOLECYSTECTOMY  11/27/2011   Procedure: LAPAROSCOPIC CHOLECYSTECTOMY WITH INTRAOPERATIVE CHOLANGIOGRAM;  Surgeon: Adin Hector, MD;  Location: [facility];  Service: General;  Laterality: N/A;  laparoscopic cholecystectomy with choleangiogram umbilical hernia repair  . COLONOSCOPY  07/2014   WNL Amedeo Plenty)  . COLONOSCOPY  08/2019   3 TAs, rpt 3 yrs ([place])  . dexa  08/2012   normal per patient - no records available  . ESOPHAGOGASTRODUODENOSCOPY  08/2019   reactive gastropathy, neg H pylori ([place])  . EYE SURGERY Bilateral 2014   cataract removal  . MITRAL VALVE REPAIR  1980   open heart  . ORIF ANKLE FRACTURE Left 02/26/2015   Procedure: OPEN REDUCTION INTERNAL FIXATION (ORIF) LEFT TRIMALLEOLAR ANKLE FRACTURE;  Surgeon: [name], MD;  Location: [facility];  Service: Orthopedics;  Laterality: Left;  . TUBAL LIGATION  1980  . UMBILICAL HERNIA REPAIR  11/27/2011   Procedure: HERNIA REPAIR UMBILICAL ADULT;  Surgeon: Adin Hector, MD;  Location: [facility];  Service: General;  Laterality: N/A;  . [place]   for fibroids -- partial, ovaries remain     OB History   No obstetric history on file.     Family History  Problem Relation Age of Onset  . Lupus Sister        and niece  . Cancer Mother        lung (nonsmoker)  . CAD Mother        MI in her 45s  . ALS Mother   . Kidney disease Father   . Alcohol abuse Father   . Diabetes Father   . Cancer Brother        bone  . Diabetes Sister   . Stroke Sister   . Cancer Maternal Uncle        bone  . Depression Sister   . Lupus Sister   . Kidney failure Other        on HD  . Diabetes Brother   . Heart  attack Brother   . Stroke Maternal Grandmother   . Blindness Sister   . Healthy Son   . Healthy Son   . Healthy Son   . Healthy Son     Social History   Tobacco Use  . Smoking status: Never Smoker  . Smokeless tobacco: Never Used  Vaping Use  . Vaping Use: Never used  Substance Use Topics  . Alcohol use: No    Alcohol/week: 0.0 standard drinks  . Drug use: No    Home Medications Prior to Admission medications   Medication Sig Start Date End Date Taking? Authorizing Provider  ALPRAZolam Duanne Moron) 0.5 MG tablet Take  1 tablet (0.5 mg total) by mouth 2 (two) times daily. 08/25/19 08/24/20  Cloria Spring, MD  amitriptyline (ELAVIL) 25 MG tablet Take 1 tablet (25 mg total) by mouth at bedtime. 08/25/19   Cloria Spring, MD  antiseptic oral rinse (BIOTENE) LIQD 15 mLs by Mouth Rinse route at bedtime.     [provider]  aspirin EC 81 MG tablet Take 1 tablet (81 mg total) by mouth daily. 10/29/19   Lelon Perla, MD  cholecalciferol (VITAMIN D) 1000 UNITS tablet Take 1,000 Units by mouth daily.     [provider]  Co-Enzyme Q-10 100 MG CAPS Take 1 capsule (100 mg total) by mouth daily. 12/15/17   Ria Bush, MD  diclofenac sodium (VOLTAREN) 1 % GEL Apply 1 application topically 2 (two) times daily as needed (pain).    [provider]  diltiazem (CARDIZEM CD) 360 MG 24 hr capsule Take 1 capsule (360 mg total) by mouth daily. 10/29/19   Lelon Perla, MD  escitalopram (LEXAPRO) 20 MG tablet Take 1 tablet (20 mg total) by mouth 2 (two) times daily. 08/25/19   Cloria Spring, MD  ezetimibe (ZETIA) 10 MG tablet TAKE 1 TABLET BY MOUTH AT  BEDTIME 03/01/19   Ria Bush, MD  famotidine (PEPCID) 20 MG tablet TAKE 1 TABLET BY MOUTH TWO  TIMES DAILY 01/22/19   Ria Bush, MD  furosemide (LASIX) 20 MG tablet TAKE 1 TABLET BY MOUTH  DAILY AS NEEDED FOR EDEMA 09/07/19   Ria Bush, MD  gabapentin (NEURONTIN) 300 MG capsule TAKE 2 CAPSULES BY MOUTH   EVERY MORNING, 1 CAPSULE  EVERY AFTERNOON AND 2  CAPSULES AT BEDTIME. 03/09/19   Ria Bush, MD  lovastatin (MEVACOR) 10 MG tablet TAKE 1 TABLET BY MOUTH  EVERY MONDAY, [place],  AND FRIDAY 04/29/19   Ria Bush, MD  methocarbamol (ROBAXIN) 500 MG tablet Take 1 tablet (500 mg total) by mouth 2 (two) times daily as needed for muscle spasms. 10/21/19   Bo Merino, MD  metoprolol tartrate (LOPRESSOR) 50 MG tablet Take 1.5 tablets (75 mg total) by mouth 2 (two) times daily. 06/19/19 10/21/19  Ria Bush, MD  Polyethyl Glycol-Propyl Glycol (SYSTANE) 0.4-0.3 % GEL Place 1 drop into both eyes 2 (two) times daily.     [provider]  prednisoLONE acetate (PRED FORTE) 1 % ophthalmic suspension Place 1 drop into both eyes 2 (two) times daily. 08/10/19   [provider]  RESTASIS 0.05 % ophthalmic emulsion INSTILL ONE DROP IN BOTH  EYES TWO TIMES DAILY 12/02/16   Ria Bush, MD  traMADol (ULTRAM) 50 MG tablet Take 1 tablet (50 mg total) by mouth 3 (three) times daily. 11/10/19   Ofilia Neas, PA-C    Allergies    Cymbalta [duloxetine hcl], Statins, and Sulfa antibiotics  Review of Systems   Review of Systems  Gastrointestinal: Positive for abdominal pain.  All other systems reviewed and are negative.   Physical Exam Updated Vital Signs BP (!) 141/94 (BP Location: Right Arm)   Pulse 74   Temp 98 F (36.7 C) (Oral)   Resp 16   SpO2 98%   Physical Exam Vitals and nursing note reviewed.   69 year old female, resting comfortably and in no acute distress. Vital signs are significant for borderline elevated blood pressure. Oxygen saturation is 98%, which is normal. Head is normocephalic and atraumatic. PERRLA, EOMI. Oropharynx is clear. Neck is nontender and supple without adenopathy or JVD. Back is  nontender in the midline.  There is mild bilateral CVA tenderness. Lungs are clear without rales, wheezes, or rhonchi. Chest is nontender. Heart has  regular rate and rhythm without murmur. Abdomen is soft, flat, with mild to moderate tenderness in the right mid and lower abdomen as well as the suprapubic area.  There are no masses or hepatosplenomegaly and peristalsis is normoactive. Extremities have trace edema, full range of motion is present. Skin is warm and dry without rash. Neurologic: Mental status is normal, cranial nerves are intact, there are no motor or sensory deficits.  ED Results / Procedures / Treatments   Labs (all labs ordered are listed, but only abnormal results are displayed) Labs Reviewed  COMPREHENSIVE METABOLIC PANEL - Abnormal; Notable for the following components:      Result Value   Creatinine, Ser 1.26 (*)    GFR calc non Af Amer 44 (*)    GFR calc Af Amer 51 (*)    All other components within normal limits  URINALYSIS, ROUTINE W REFLEX MICROSCOPIC - Abnormal; Notable for the following components:   Leukocytes,Ua MODERATE (*)    All other components within normal limits  LIPASE, BLOOD  CBC   Radiology CT Renal Stone Study  Result Date: 11/12/2019 CLINICAL DATA:  69 year old female with flank pain. Concern for kidney stone. EXAM: CT ABDOMEN AND PELVIS WITHOUT CONTRAST TECHNIQUE: Multidetector CT imaging of the abdomen and pelvis was performed following the standard protocol without IV contrast. COMPARISON:  CT abdomen pelvis dated 09/10/2012. FINDINGS: Evaluation of this exam is limited in the absence of intravenous contrast. Lower chest: A 7 mm ground-glass nodular density at the right lung base is stable. The visualized lung bases are otherwise clear. Calcification of the left ventricle apex. No intra-abdominal free air or free fluid. Hepatobiliary: Small left hepatic hypodense focus is not characterized on this CT. The liver is otherwise unremarkable. No intrahepatic biliary ductal dilatation. Cholecystectomy. No retained calcified stone noted in the central CBD. Pancreas: Unremarkable. No pancreatic ductal  dilatation or surrounding inflammatory changes. Spleen: Normal in size without focal abnormality. Adrenals/Urinary Tract: The adrenal glands unremarkable. There is a punctate nonobstructing right renal inferior pole calculus. There is mild bilateral renal parenchyma atrophy. There is no hydronephrosis on either side. The visualized ureters appear unremarkable the urinary bladder is collapsed. Stomach/Bowel: There is moderate amount of stool throughout the colon. There is no bowel obstruction or active inflammation. The appendix is normal. Vascular/Lymphatic: Mild aortoiliac atherosclerotic disease. The IVC is unremarkable. No portal venous gas. There is no adenopathy. Reproductive: Hysterectomy.  No adnexal masses. Other: Small fat containing umbilical hernia. No fluid collection. Left anterior pelvic wall subcutaneous edema or scarring. No fluid collection. Musculoskeletal: Degenerative changes of the spine. No acute osseous pathology. IMPRESSION: 1. No acute intra-abdominal or pelvic pathology. 2. Punctate nonobstructing right renal inferior pole calculus. No hydronephrosis. 3. Moderate colonic stool burden. No bowel obstruction. Normal appendix. 4. Aortic Atherosclerosis (ICD10-I70.0). Electronically Signed   By: Anner Crete M.D.   On: 11/12/2019 03:18    Procedures Procedures  Medications Ordered in ED Medications - No data to display  ED Course  I have reviewed the triage vital signs and the nursing notes.  Pertinent labs & imaging results that were available during my care of the patient were reviewed by me and considered in my medical decision making (see chart for details).  MDM Rules/Calculators/A&P Back and lower abdominal pain.  Exam is relatively benign and I suspect this is all musculoskeletal.  Urinalysis shows no sign of infection.  CBC is normal.  Metabolic panel is significant only for mildly elevated creatinine which is unchanged from baseline.  To need to consider urolithiasis,  diverticulitis, appendicitis.  Will send for renal stone protocol CT scan.  Old records reviewed, and she does have a prior ED visit for urolithiasis.  CT scan shows no acute process.  Patient continues to have only mild abdominal pain and she is felt to be safe for discharge.  She is referred back to her primary care provider.  Return precautions discussed.  Final Clinical Impression(s) / ED Diagnoses Final diagnoses:  Lower abdominal pain  Renal insufficiency    Rx / DC Orders ED Discharge Orders    None       Delora Fuel, MD 76/15/18 [phone]

## 2019-11-12 NOTE — Discharge Instructions (Addendum)
Return if pain is getting worse. °

## 2019-11-15 ENCOUNTER — Other Ambulatory Visit: Payer: Self-pay

## 2019-11-15 ENCOUNTER — Ambulatory Visit (INDEPENDENT_AMBULATORY_CARE_PROVIDER_SITE_OTHER): Payer: Medicare HMO | Admitting: Cardiology

## 2019-11-15 ENCOUNTER — Encounter: Payer: Self-pay | Admitting: Cardiology

## 2019-11-15 VITALS — BP 148/82 | HR 95 | Ht 61.0 in | Wt 192.2 lb

## 2019-11-15 DIAGNOSIS — I10 Essential (primary) hypertension: Secondary | ICD-10-CM

## 2019-11-15 DIAGNOSIS — I342 Nonrheumatic mitral (valve) stenosis: Secondary | ICD-10-CM | POA: Diagnosis not present

## 2019-11-15 DIAGNOSIS — R002 Palpitations: Secondary | ICD-10-CM | POA: Diagnosis not present

## 2019-11-15 DIAGNOSIS — I359 Nonrheumatic aortic valve disorder, unspecified: Secondary | ICD-10-CM

## 2019-11-15 DIAGNOSIS — I340 Nonrheumatic mitral (valve) insufficiency: Secondary | ICD-10-CM

## 2019-11-15 MED ORDER — DILTIAZEM HCL ER COATED BEADS 120 MG PO CP24: 120.0000 mg | ORAL_CAPSULE | Freq: Every day | ORAL | 3 refills | Status: DC

## 2019-11-15 NOTE — Patient Instructions (Signed)
Medication Instructions:   INCREASE DILTIAZEM TO 480 MG ONCE DAILY= 1 OF THE 360 MG TABLETS AND 1 OF THE 120 MG TABLETS TOGETHER ONCE DAILY  *If you need a refill on your cardiac medications before your next appointment, please call your pharmacy*   Lab Work: If you have labs (blood work) drawn today and your tests are completely normal, you will receive your results only by: Marland Kitchen MyChart Message (if you have MyChart) OR . A paper copy in the mail If you have any lab test that is abnormal or we need to change your treatment, we will call you to review the results.   Follow-Up: At [facility], you and your health needs are our priority.  As part of our continuing mission to provide you with exceptional heart care, we have created designated Provider Care Teams.  These Care Teams include your primary Cardiologist (physician) and Advanced Practice Providers (APPs -  Physician Assistants and Nurse Practitioners) who all work together to provide you with the care you need, when you need it.  We recommend signing up for the patient portal called "MyChart".  Sign up information is provided on this After Visit Summary.  MyChart is used to connect with patients for Virtual Visits (Telemedicine).  Patients are able to view lab/test results, encounter notes, upcoming appointments, etc.  Non-urgent messages can be sent to your provider as well.   To learn more about what you can do with MyChart, go to [email or web address].    Your next appointment:   6 month(s)  The format for your next appointment:   In Person  Provider:   You may see Kirk Ruths, MD or one of the following Advanced Practice Providers on your designated Care Team:    Kerin Ransom, PA-C  [place], [place]  Coletta Memos, [place]

## 2019-11-25 ENCOUNTER — Other Ambulatory Visit: Payer: Self-pay

## 2019-11-25 ENCOUNTER — Telehealth (INDEPENDENT_AMBULATORY_CARE_PROVIDER_SITE_OTHER): Payer: Medicare HMO | Admitting: Psychiatry

## 2019-11-25 ENCOUNTER — Encounter (HOSPITAL_COMMUNITY): Payer: Self-pay | Admitting: Psychiatry

## 2019-11-25 DIAGNOSIS — F331 Major depressive disorder, recurrent, moderate: Secondary | ICD-10-CM

## 2019-11-25 DIAGNOSIS — R69 Illness, unspecified: Secondary | ICD-10-CM | POA: Diagnosis not present

## 2019-11-25 MED ORDER — ALPRAZOLAM 0.5 MG PO TABS: 0.5000 mg | ORAL_TABLET | Freq: Two times a day (BID) | ORAL | 2 refills | Status: DC

## 2019-11-25 MED ORDER — AMITRIPTYLINE HCL 25 MG PO TABS
25.0000 mg | ORAL_TABLET | Freq: Every day | ORAL | 2 refills | Status: DC
Start: 2019-11-25 — End: 2020-02-24

## 2019-11-25 MED ORDER — ESCITALOPRAM OXALATE 20 MG PO TABS
20.0000 mg | ORAL_TABLET | Freq: Two times a day (BID) | ORAL | 2 refills | Status: DC
Start: 2019-11-25 — End: 2020-02-24

## 2019-11-25 NOTE — Progress Notes (Signed)
Virtual Visit via Video Note  I connected with Alexandria Leach on 11/25/19 at  1:00 PM EDT by a video enabled telemedicine application and verified that I am speaking with the correct person using two identifiers.   I discussed the limitations of evaluation and management by telemedicine and the availability of in person appointments. The patient expressed understanding and agreed to proceed.    I discussed the assessment and treatment plan with the patient. The patient was provided an opportunity to ask questions and all were answered. The patient agreed with the plan and demonstrated an understanding of the instructions.   The patient was advised to call back or seek an in-person evaluation if the symptoms worsen or if the condition fails to improve as anticipated.  I provided 15 minutes of non-face-to-face time during this encounter. Location: Provider office, patient home  [name], MD  [facility] MD/PA/NP OP Progress Note  11/25/2019 1:14 PM Alexandria Leach  MRN:  [id number]  Chief Complaint:  Chief Complaint    Depression; Anxiety; Follow-up     HPI: This patient is a 69 year old separated black female who lives alone in [place].  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  She has 4 sons.  The patient returns for follow-up after 3 months.  She states that for the most part she has been doing okay.  She has been staying around home mostly to avoid the heat and because of the coronavirus.  She has been vaccinated but still being very cautious.  She is still having dryness in her throat and eyes to the Sjogren's syndrome and the traditional treatments have not been working.  She is a bit down because she is approaching the anniversary of the death of several family members but she is trying to stay upbeat and spend time with her 38-year-old grandson.  Her mood is generally stable and she denies severe anxiety  or suicidal ideation.  She is sleeping well. Visit Diagnosis:    ICD-10-CM   1. Major depressive disorder, recurrent episode, moderate (HCC)  F33.1     Past Psychiatric History: none  Past Medical History:  Past Medical History:  Diagnosis Date  . Ankle fracture, left 02/19/2015   from a fall  . Anxiety   . Cataract 2014   corrected with surgery  . CHF (congestive heart failure) ([place])   . CKD (chronic kidney disease) stage 3, GFR 30-59 ml/min    saw nephrologist Dr Harden Mo  . DDD (degenerative disc disease), cervical   . Depression   . Fibromyalgia   . Glaucoma    s/p surgery, sees ophtho Q6 mo  . 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 History of kidney stones 2010  . History of pulmonary embolism 2001, 2006   completed coumadin courses  . History of rheumatic fever x3  . HLD (hyperlipidemia)   . HTN (hypertension)   . Insomnia   . Lung nodule 09/22/2012   RLL - 11mm, stable since 2014. Thought benign.   . Osteoarthritis    shoulders and knees, not RA per Dr Estanislado Pandy, positive ANA, positive Ro  . Osteopenia 09/18/2015   DEXA T -1.1 hip, -0.2 spine 08/2015   . Personal history of urinary calculi latest 2014  . Pneumonia 12/02/2011  . PONV (postoperative nausea and vomiting)   . Refusal of blood transfusions as patient is  Jehovah's Witness   . Rheumatic heart disease 1980   s/p mitral valve repair 1980  . Sjogren's syndrome ([place])   . Trimalleolar fracture of left ankle 02/23/2015    Past Surgical History:  Procedure Laterality Date  . BREAST BIOPSY Right 2006   benign  . CHOLECYSTECTOMY  11/27/2011   Procedure: LAPAROSCOPIC CHOLECYSTECTOMY WITH INTRAOPERATIVE CHOLANGIOGRAM;  Surgeon: Adin Hector, MD;  Location: [facility];  Service: General;  Laterality: N/A;  laparoscopic cholecystectomy with choleangiogram umbilical hernia repair  . COLONOSCOPY  07/2014   WNL Amedeo Plenty)  . COLONOSCOPY  08/2019   3 TAs, rpt  3 yrs ([place])  . dexa  08/2012   normal per patient - no records available  . ESOPHAGOGASTRODUODENOSCOPY  08/2019   reactive gastropathy, neg H pylori ([place])  . EYE SURGERY Bilateral 2014   cataract removal  . MITRAL VALVE REPAIR  1980   open heart  . ORIF ANKLE FRACTURE Left 02/26/2015   Procedure: OPEN REDUCTION INTERNAL FIXATION (ORIF) LEFT TRIMALLEOLAR ANKLE FRACTURE;  Surgeon: [name], MD;  Location: [facility];  Service: Orthopedics;  Laterality: Left;  . TUBAL LIGATION  1980  . UMBILICAL HERNIA REPAIR  11/27/2011   Procedure: HERNIA REPAIR UMBILICAL ADULT;  Surgeon: Adin Hector, MD;  Location: [facility];  Service: General;  Laterality: N/A;  . [place]   for fibroids -- partial, ovaries remain    Family Psychiatric History: see below  Family History:  Family History  Problem Relation Age of Onset  . Lupus Sister        and niece  . Cancer Mother        lung (nonsmoker)  . CAD Mother        MI in her 26s  . ALS Mother   . Kidney disease Father   . Alcohol abuse Father   . Diabetes Father   . Cancer Brother        bone  . Diabetes Sister   . Stroke Sister   . Cancer Maternal Uncle        bone  . Depression Sister   . Lupus Sister   . Kidney failure Other        on HD  . Diabetes Brother   . Heart attack Brother   . Stroke Maternal Grandmother   . Blindness Sister   . Healthy Son   . Healthy Son   . Healthy Son   . Healthy Son     Social History:  Social History   Socioeconomic History  . Marital status: Married    Spouse name: [name]  . Number of children: 4  . Years of education: 58  . Highest education level: Not on file  Occupational History    Employer: UNEMPLOYED    Comment: Disability  Tobacco Use  . Smoking status: Never Smoker  . Smokeless tobacco: Never Used  Vaping Use  . Vaping Use: Never used  Substance and Sexual Activity  . Alcohol use: No    Alcohol/week: 0.0 standard drinks  . Drug use: No  .  Sexual activity: Not Currently    Birth control/protection: Surgical  Other Topics Concern  . Not on file  Social History Narrative   Lives with son, 1 dog   Occupation: unemployed, on disability for fibromyalgia since 2008.   Edu: HS   Religion: Jehova's witness   Activity: volunteers at senior center   Diet: some water, fruits/vegetables daily   No caffeine use  Social Determinants of Health   Financial Resource Strain: Low Risk   . Difficulty of Paying Living Expenses: Not hard at all  Food Insecurity: No Food Insecurity  . Worried About Charity fundraiser in the Last Year: Never true  . Ran Out of Food in the Last Year: Never true  Transportation Needs: No Transportation Needs  . Lack of Transportation (Medical): No  . Lack of Transportation (Non-Medical): No  Physical Activity: Inactive  . Days of Exercise per Week: 0 days  . Minutes of Exercise per Session: 0 min  Stress: Stress Concern Present  . Feeling of Stress : To some extent  Social Connections:   . Frequency of Communication with Friends and Family: Not on file  . Frequency of Social Gatherings with Friends and Family: Not on file  . Attends Religious Services: Not on file  . Active Member of Clubs or Organizations: Not on file  . Attends Archivist Meetings: Not on file  . Marital Status: Not on file    Allergies:  Allergies  Allergen Reactions  . Cymbalta [Duloxetine Hcl] Other (See Comments)    tachycardia  . Statins Nausea Only and Other (See Comments)    Muscle cramps also  . Sulfa Antibiotics Nausea And Vomiting    Metabolic Disorder Labs: Lab Results  Component Value Date   HGBA1C 5.6 10/13/2015   MPG 114 10/13/2015   No results found for: PROLACTIN Lab Results  Component Value Date   CHOL 175 12/15/2018   TRIG 70.0 12/15/2018   HDL 80.40 12/15/2018   CHOLHDL 2 12/15/2018   VLDL 14.0 12/15/2018   LDLCALC 80 12/15/2018   LDLCALC 158 (H) 12/11/2017   Lab Results   Component Value Date   TSH 1.91 10/06/2018   TSH 3.55 04/03/2018    Therapeutic Level Labs: No results found for: LITHIUM No results found for: VALPROATE No components found for:  CBMZ  Current Medications: Current Outpatient Medications  Medication Sig Dispense Refill  . ALPRAZolam (XANAX) 0.5 MG tablet Take 1 tablet (0.5 mg total) by mouth 2 (two) times daily. 180 tablet 2  . amitriptyline (ELAVIL) 25 MG tablet Take 1 tablet (25 mg total) by mouth at bedtime. 90 tablet 2  . antiseptic oral rinse (BIOTENE) LIQD 15 mLs by Mouth Rinse route at bedtime.     Marland Kitchen aspirin EC 81 MG tablet Take 1 tablet (81 mg total) by mouth daily. 30 tablet   . cholecalciferol (VITAMIN D) 1000 UNITS tablet Take 1,000 Units by mouth daily.     Marland Kitchen Co-Enzyme Q-10 100 MG CAPS Take 1 capsule (100 mg total) by mouth daily.  0  . diclofenac sodium (VOLTAREN) 1 % GEL Apply 1 application topically 2 (two) times daily as needed (pain).    Marland Kitchen diltiazem (CARDIZEM CD) 120 MG 24 hr capsule Take 1 capsule (120 mg total) by mouth daily. 90 capsule 3  . diltiazem (CARDIZEM CD) 360 MG 24 hr capsule Take 1 capsule (360 mg total) by mouth daily. 90 capsule 3  . escitalopram (LEXAPRO) 20 MG tablet Take 1 tablet (20 mg total) by mouth 2 (two) times daily. 180 tablet 2  . ezetimibe (ZETIA) 10 MG tablet TAKE 1 TABLET BY MOUTH AT  BEDTIME 90 tablet 3  . famotidine (PEPCID) 20 MG tablet TAKE 1 TABLET BY MOUTH TWO  TIMES DAILY 180 tablet 3  . furosemide (LASIX) 20 MG tablet TAKE 1 TABLET BY MOUTH  DAILY AS NEEDED FOR EDEMA 90 tablet  3  . gabapentin (NEURONTIN) 300 MG capsule TAKE 2 CAPSULES BY MOUTH  EVERY MORNING, 1 CAPSULE  EVERY AFTERNOON AND 2  CAPSULES AT BEDTIME. 450 capsule 3  . lovastatin (MEVACOR) 10 MG tablet TAKE 1 TABLET BY MOUTH  EVERY MONDAY, WEDNESDAY,  AND FRIDAY 39 tablet 3  . methocarbamol (ROBAXIN) 500 MG tablet Take 1 tablet (500 mg total) by mouth 2 (two) times daily as needed for muscle spasms. 14 tablet 0  .  metoprolol tartrate (LOPRESSOR) 50 MG tablet Take 1.5 tablets (75 mg total) by mouth 2 (two) times daily. 270 tablet 3  . Polyethyl Glycol-Propyl Glycol (SYSTANE) 0.4-0.3 % GEL Place 1 drop into both eyes 2 (two) times daily.     . RESTASIS 0.05 % ophthalmic emulsion INSTILL ONE DROP IN BOTH  EYES TWO TIMES DAILY 180 each 0  . traMADol (ULTRAM) 50 MG tablet Take 1 tablet (50 mg total) by mouth 3 (three) times daily. 60 tablet 0   No current facility-administered medications for this visit.     Musculoskeletal: Strength & Muscle Tone: within normal limits Gait & Station: normal Patient leans: N/A  Psychiatric Specialty Exam: Review of Systems  Constitutional: Positive for fatigue.  Musculoskeletal: Positive for arthralgias, back pain and myalgias.  All other systems reviewed and are negative.   There were no vitals taken for this visit.There is no height or weight on file to calculate BMI.  General Appearance: Casual and Fairly Groomed  Eye Contact:  Good  Speech:  Clear and Coherent  Volume:  Decreased  Mood:  Euthymic  Affect:  Appropriate and Congruent  Thought Process:  Goal Directed  Orientation:  Full (Time, Place, and Person)  Thought Content: Rumination   Suicidal Thoughts:  No  Homicidal Thoughts:  No  Memory:  Immediate;   Good Recent;   Good Remote;   Good  Judgement:  Good  Insight:  Good  Psychomotor Activity:  Decreased  Concentration:  Concentration: Good and Attention Span: Good  Recall:  Good  Fund of Knowledge: Good  Language: Good  Akathisia:  No  Handed:  Right  AIMS (if indicated): not done  Assets:  Communication Skills Desire for Improvement Resilience Social Support Talents/Skills  ADL's:  Intact  Cognition: WNL  Sleep:  Good   Screenings: GAD-7     Office Visit from 12/22/2018 in [place] at [facility]  Total GAD-7 Score 5    Mini-Mental     Clinical Support from 12/11/2017 in [place] at [place] from 12/09/2016 in [facility] [facility] [place] from 11/30/2015 in [place] at [facility]  Total Score (max 30 points ) 20 19 18     PHQ2-9     Office Visit from 12/22/2018 in [place] [facility] [place] from 12/21/2018 in [facility] [place] at [place] from 12/11/2017 in [place] at [place] from 12/09/2016 in [place] at [place] from 11/30/2015 in [place] at [facility] Total Score 0 1 0 2 4  PHQ-9 Total Score 4 1 0 4 13       Assessment and Plan: This patient is a 69 year old female with a history of depression and anxiety.  Despite her chronic medical issues she continues to do fairly well.  She will continue Lexapro 20 mg twice daily for depression, Xanax 0.5 mg twice daily as needed for anxiety and amitriptyline 25 mg at bedtime for  sleep.  She will return to see me in 3 months   [name], MD 11/25/2019, 1:14 PM

## 2019-12-01 ENCOUNTER — Telehealth: Payer: Self-pay | Admitting: Family Medicine

## 2019-12-01 NOTE — Telephone Encounter (Signed)
LVM for pt to rtn my call to r/s appt with nha. 

## 2019-12-23 ENCOUNTER — Other Ambulatory Visit (INDEPENDENT_AMBULATORY_CARE_PROVIDER_SITE_OTHER): Payer: Medicare HMO

## 2019-12-23 ENCOUNTER — Other Ambulatory Visit: Payer: Self-pay | Admitting: Family Medicine

## 2019-12-23 ENCOUNTER — Other Ambulatory Visit: Payer: Self-pay

## 2019-12-23 ENCOUNTER — Ambulatory Visit: Payer: Medicare Other

## 2019-12-23 DIAGNOSIS — I10 Essential (primary) hypertension: Secondary | ICD-10-CM

## 2019-12-23 DIAGNOSIS — N1831 Chronic kidney disease, stage 3a: Secondary | ICD-10-CM | POA: Diagnosis not present

## 2019-12-23 DIAGNOSIS — E7800 Pure hypercholesterolemia, unspecified: Secondary | ICD-10-CM

## 2019-12-23 DIAGNOSIS — R7989 Other specified abnormal findings of blood chemistry: Secondary | ICD-10-CM

## 2019-12-23 LAB — CBC WITH DIFFERENTIAL/PLATELET
Basophils Absolute: 0 10*3/uL (ref 0.0–0.1)
Basophils Relative: 0.8 % (ref 0.0–3.0)
Eosinophils Absolute: 0.1 10*3/uL (ref 0.0–0.7)
Eosinophils Relative: 1.9 % (ref 0.0–5.0)
HCT: 36.9 % (ref 36.0–46.0)
Hemoglobin: 12 g/dL (ref 12.0–15.0)
Lymphocytes Relative: 25.4 % (ref 12.0–46.0)
Lymphs Abs: 1.1 10*3/uL (ref 0.7–4.0)
MCHC: 32.5 g/dL (ref 30.0–36.0)
MCV: 84.6 fl (ref 78.0–100.0)
Monocytes Absolute: 0.5 10*3/uL (ref 0.1–1.0)
Monocytes Relative: 10.3 % (ref 3.0–12.0)
Neutro Abs: 2.8 10*3/uL (ref 1.4–7.7)
Neutrophils Relative %: 61.6 % (ref 43.0–77.0)
Platelets: 287 10*3/uL (ref 150.0–400.0)
RBC: 4.36 Mil/uL (ref 3.87–5.11)
RDW: 14.2 % (ref 11.5–15.5)
WBC: 4.5 10*3/uL (ref 4.0–10.5)

## 2019-12-23 LAB — LIPID PANEL
Cholesterol: 156 mg/dL (ref 0–200)
HDL: 56.1 mg/dL (ref >39.00)
LDL Cholesterol: 86 mg/dL (ref 0–99)
NonHDL: 100.38
Total CHOL/HDL Ratio: 3
Triglycerides: 72 mg/dL (ref 0.0–149.0)
VLDL: 14.4 mg/dL (ref 0.0–40.0)

## 2019-12-23 LAB — COMPREHENSIVE METABOLIC PANEL
ALT: 12 U/L (ref 0–35)
AST: 21 U/L (ref 0–37)
Albumin: 4.3 g/dL (ref 3.5–5.2)
Alkaline Phosphatase: 75 U/L (ref 39–117)
BUN: 12 mg/dL (ref 6–23)
CO2: 24 mEq/L (ref 19–32)
Calcium: 9.7 mg/dL (ref 8.4–10.5)
Chloride: 107 mEq/L (ref 96–112)
Creatinine, Ser: 1.28 mg/dL — ABNORMAL HIGH (ref 0.40–1.20)
GFR: 43.09 mL/min — ABNORMAL LOW (ref >60.00)
Glucose, Bld: 113 mg/dL — ABNORMAL HIGH (ref 70–99)
Potassium: 4.2 mEq/L (ref 3.5–5.1)
Sodium: 139 mEq/L (ref 135–145)
Total Bilirubin: 0.5 mg/dL (ref 0.2–1.2)
Total Protein: 7.2 g/dL (ref 6.0–8.3)

## 2019-12-23 LAB — MICROALBUMIN / CREATININE URINE RATIO
Creatinine,U: 246 mg/dL
Microalb Creat Ratio: 2 mg/g (ref 0.0–30.0)
Microalb, Ur: 4.8 mg/dL — ABNORMAL HIGH (ref 0.0–1.9)

## 2019-12-23 LAB — T4, FREE: Free T4: 0.69 ng/dL (ref 0.60–1.60)

## 2019-12-23 LAB — VITAMIN D 25 HYDROXY (VIT D DEFICIENCY, FRACTURES): VITD: 42.65 ng/mL (ref 30.00–100.00)

## 2019-12-23 LAB — TSH: TSH: 4.12 u[IU]/mL (ref 0.35–4.50)

## 2019-12-24 LAB — PARATHYROID HORMONE, INTACT (NO CA): PTH: 115 pg/mL — ABNORMAL HIGH (ref 14–64)

## 2019-12-28 ENCOUNTER — Encounter: Payer: Self-pay | Admitting: Family Medicine

## 2019-12-28 ENCOUNTER — Other Ambulatory Visit: Payer: Self-pay

## 2019-12-28 ENCOUNTER — Ambulatory Visit (INDEPENDENT_AMBULATORY_CARE_PROVIDER_SITE_OTHER): Payer: Medicare HMO | Admitting: Family Medicine

## 2019-12-28 VITALS — BP 140/80 | HR 88 | Temp 97.9°F | Ht 62.0 in | Wt 199.1 lb

## 2019-12-28 DIAGNOSIS — N1832 Chronic kidney disease, stage 3b: Secondary | ICD-10-CM

## 2019-12-28 DIAGNOSIS — I5032 Chronic diastolic (congestive) heart failure: Secondary | ICD-10-CM

## 2019-12-28 DIAGNOSIS — Z531 Procedure and treatment not carried out because of patient's decision for reasons of belief and group pressure: Secondary | ICD-10-CM

## 2019-12-28 DIAGNOSIS — N2581 Secondary hyperparathyroidism of renal origin: Secondary | ICD-10-CM | POA: Insufficient documentation

## 2019-12-28 DIAGNOSIS — Z0000 Encounter for general adult medical examination without abnormal findings: Secondary | ICD-10-CM | POA: Diagnosis not present

## 2019-12-28 DIAGNOSIS — I099 Rheumatic heart disease, unspecified: Secondary | ICD-10-CM

## 2019-12-28 DIAGNOSIS — Z90710 Acquired absence of both cervix and uterus: Secondary | ICD-10-CM

## 2019-12-28 DIAGNOSIS — M8589 Other specified disorders of bone density and structure, multiple sites: Secondary | ICD-10-CM

## 2019-12-28 DIAGNOSIS — Z9889 Other specified postprocedural states: Secondary | ICD-10-CM

## 2019-12-28 DIAGNOSIS — I10 Essential (primary) hypertension: Secondary | ICD-10-CM

## 2019-12-28 DIAGNOSIS — E7800 Pure hypercholesterolemia, unspecified: Secondary | ICD-10-CM

## 2019-12-28 DIAGNOSIS — H409 Unspecified glaucoma: Secondary | ICD-10-CM

## 2019-12-28 DIAGNOSIS — I739 Peripheral vascular disease, unspecified: Secondary | ICD-10-CM

## 2019-12-28 DIAGNOSIS — M3501 Sicca syndrome with keratoconjunctivitis: Secondary | ICD-10-CM

## 2019-12-28 DIAGNOSIS — F331 Major depressive disorder, recurrent, moderate: Secondary | ICD-10-CM

## 2019-12-28 DIAGNOSIS — K5900 Constipation, unspecified: Secondary | ICD-10-CM | POA: Diagnosis not present

## 2019-12-28 DIAGNOSIS — M797 Fibromyalgia: Secondary | ICD-10-CM

## 2019-12-28 MED ORDER — POLYETHYLENE GLYCOL 3350 17 GM/SCOOP PO POWD: 8.5000 g | Freq: Every day | ORAL | 1 refills | Status: DC | PRN

## 2019-12-28 MED ORDER — DOCUSATE SODIUM 100 MG PO CAPS: 100.0000 mg | ORAL_CAPSULE | Freq: Every day | ORAL | Status: DC

## 2019-12-28 NOTE — Patient Instructions (Addendum)
May add miralax 1/2-1 capful daily as needed for constipation. Ok to continue daily colace.  You are doing well today  Continue current medicines Return as needed or in 6 months for follow up visit.   Health Maintenance After Age 67 After age 62, you are at a higher risk for certain long-term diseases and infections as well as injuries from falls. Falls are a major cause of broken bones and head injuries in people who are older than age 45. Getting regular preventive care can help to keep you healthy and well. Preventive care includes getting regular testing and making lifestyle changes as recommended by your health care provider. Talk with your health care provider about:  Which screenings and tests you should have. A screening is a test that checks for a disease when you have no symptoms.  A diet and exercise plan that is right for you. What should I know about screenings and tests to prevent falls? Screening and testing are the best ways to find a health problem early. Early diagnosis and treatment give you the best chance of managing medical conditions that are common after age 2. Certain conditions and lifestyle choices may make you more likely to have a fall. Your health care provider may recommend:  Regular vision checks. Poor vision and conditions such as cataracts can make you more likely to have a fall. If you wear glasses, make sure to get your prescription updated if your vision changes.  Medicine review. Work with your health care provider to regularly review all of the medicines you are taking, including over-the-counter medicines. Ask your health care provider about any side effects that may make you more likely to have a fall. Tell your health care provider if any medicines that you take make you feel dizzy or sleepy.  Osteoporosis screening. Osteoporosis is a condition that causes the bones to get weaker. This can make the bones weak and cause them to break more easily.  Blood  pressure screening. Blood pressure changes and medicines to control blood pressure can make you feel dizzy.  Strength and balance checks. Your health care provider may recommend certain tests to check your strength and balance while standing, walking, or changing positions.  Foot health exam. Foot pain and numbness, as well as not wearing proper footwear, can make you more likely to have a fall.  Depression screening. You may be more likely to have a fall if you have a fear of falling, feel emotionally low, or feel unable to do activities that you used to do.  Alcohol use screening. Using too much alcohol can affect your balance and may make you more likely to have a fall. What actions can I take to lower my risk of falls? General instructions  Talk with your health care provider about your risks for falling. Tell your health care provider if: ? You fall. Be sure to tell your health care provider about all falls, even ones that seem minor. ? You feel dizzy, sleepy, or off-balance.  Take over-the-counter and prescription medicines only as told by your health care provider. These include any supplements.  Eat a healthy diet and maintain a healthy weight. A healthy diet includes low-fat dairy products, low-fat (lean) meats, and fiber from whole grains, beans, and lots of fruits and vegetables. Home safety  Remove any tripping hazards, such as rugs, cords, and clutter.  Install safety equipment such as grab bars in bathrooms and safety rails on stairs.  Keep rooms and walkways well-lit.  Activity   Follow a regular exercise program to stay fit. This will help you maintain your balance. Ask your health care provider what types of exercise are appropriate for you.  If you need a cane or walker, use it as recommended by your health care provider.  Wear supportive shoes that have nonskid soles. Lifestyle  Do not drink alcohol if your health care provider tells you not to drink.  If you  drink alcohol, limit how much you have: ? 0-1 drink a day for women. ? 0-2 drinks a day for men.  Be aware of how much alcohol is in your drink. In the [place] one drink equals one typical bottle of beer (12 oz), one-half glass of wine (5 oz), or one shot of hard liquor (1 oz).  Do not use any products that contain nicotine or tobacco, such as cigarettes and e-cigarettes. If you need help quitting, ask your health care provider. Summary  Having a healthy lifestyle and getting preventive care can help to protect your health and wellness after age 56.  Screening and testing are the best way to find a health problem early and help you avoid having a fall. Early diagnosis and treatment give you the best chance for managing medical conditions that are more common for people who are older than age 17.  Falls are a major cause of broken bones and head injuries in people who are older than age 64. Take precautions to prevent a fall at home.  Work with your health care provider to learn what changes you can make to improve your health and wellness and to prevent falls. This information is not intended to replace advice given to you by your health care provider. Make sure you discuss any questions you have with your health care provider. Document Revised: 06/25/2018 Document Reviewed: 01/15/2017 Elsevier Patient Education  2020 [employer].

## 2019-12-28 NOTE — Assessment & Plan Note (Signed)
Preventative protocols reviewed and updated unless pt declined. Discussed healthy diet and lifestyle.  

## 2019-12-28 NOTE — Assessment & Plan Note (Signed)
Discussed management - may add miralax to her daily colace stool softener.

## 2019-12-28 NOTE — Assessment & Plan Note (Signed)
Consider updated DEA next year.

## 2019-12-28 NOTE — Assessment & Plan Note (Signed)
Check phosphorus next labwork.

## 2019-12-28 NOTE — Assessment & Plan Note (Signed)
Sees psych - appreciate their care.

## 2019-12-28 NOTE — Assessment & Plan Note (Signed)
Chronic, adequate. Continue current regimen.  

## 2019-12-28 NOTE — Assessment & Plan Note (Signed)

## 2019-12-28 NOTE — Assessment & Plan Note (Signed)
Continue aspirin, statin.  

## 2019-12-28 NOTE — Assessment & Plan Note (Signed)
Reviewed with patient. Continue to monitor. Aware of importance of good hydration status.

## 2019-12-28 NOTE — Assessment & Plan Note (Signed)
Appreciate rheum care - now off plaquenil.

## 2019-12-28 NOTE — Assessment & Plan Note (Signed)
Stable period on current regimen.  

## 2019-12-28 NOTE — Progress Notes (Signed)
This visit was conducted in person.  BP 140/80 (BP Location: Left Arm, Patient Position: Sitting, Cuff Size: Large)   Pulse 88   Temp 97.9 F (36.6 C) (Temporal)   Ht 5\' 2"  (1.575 m)   Wt 199 lb 1 oz (90.3 kg)   SpO2 100%   BMI 36.41 kg/m    CC: AMW/CPE Subjective:    Patient ID: Alexandria Leach, female    DOB: 02/25/1951, 69 y.o.   MRN: [id number]  HPI: Alexandria Leach is a 69 y.o. female presenting on 12/28/2019 for Medicare Wellness   Did not see health advisor this year.    Hearing Screening   125Hz  250Hz  500Hz  1000Hz  2000Hz  3000Hz  4000Hz  6000Hz  8000Hz   Right ear:           Left ear:   20 20 25  20     Comments: Pt is deaf in right ear.  Vision Screening Comments: Last eye exam, 07/2019.    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 ?plaquenil caused hearing loss - now off this over the past year.  Fall Risk  12/28/2019 12/21/2018 12/11/2017 12/09/2016 11/30/2015  Falls in the past year? 0 1 Yes No -  Comment - - lost balance and fell in August 2019 - -  Number falls in past yr: - 1 1 - -  Comment - - - - -  Injury with Fall? - 0 Yes - -  Comment - - laceration to forehead; treatment in urgent care - -  Risk Factor Category  - - - - (No Data)  Comment - - - - pt reports she has had 11 falls between 2016 and 2017  Risk for fall due to : - History of fall(s);Medication side effect;Impaired balance/gait;Impaired mobility - - -  Follow up - Falls evaluation completed;Falls prevention discussed - - -    Saw cardiology 10/2019, note reviewed. SBE ppx for prior MVR - rpt echo planned 10/2020 (MS/MR/AS/AI). Chronic dCHF.  Saw rheum 10/2019 - stable period.   Preventative: COLONOSCOPY 07/2014; WNL Amedeo Plenty)  COLONOSCOPY 08/2019 - 3 TAs, rpt 3 yrs ([place])  Breast cancer screening - birads1 mammogram8/2021  Well woman exam - s/p hysterectomy, ovaries remain.  No pelvic pain, vaginal bleeding. Pelvic exam normal 2017.  Lung cancer screening - never smoker  DEXA T -1.1 hip, -0.2 spine 08/2015  Flu shot - declines  Tetanus shot - will check with insurance  Pneumovax 2014, prevnar 2019, pneumovax 12/2018 COVID vaccine - [place] 04/2019, 05/2019, planning booster.  Shingrix - discussed, declines  Advanced directive discussion - brought forms 2017. Son Caridad Silveira is HCPOA then Otho. Clear she does not want CPR, no intubation.Has DNR form at home.Jehovah's witness - no blood products.  Seat belt use discussed Sunscreen use and skin screen discussed Non smoker  Alcohol - none  Dentist yearly - due  Eye exam yearly  Bowel - occasional constipation despite daily colace - will add miralax  Bladder - no incontinence    Lives with son, 1 dog Occupation: unemployed, on disability for fibromyalgia since 2008. Edu: HS Religion: Jehova's witness Activity:walks some Diet:goodwater, fruits/vegetablessome No caffeine use     Relevant past medical, surgical, family and social history reviewed and updated as indicated. Interim medical history since our last visit reviewed. Allergies and medications reviewed and updated. Outpatient Medications Prior to Visit  Medication Sig Dispense Refill  . ALPRAZolam (XANAX) 0.5 MG tablet Take 1 tablet (0.5 mg total) by mouth 2 (two) times daily. 180 tablet 2  . amitriptyline (ELAVIL) 25 MG tablet Take 1 tablet (25 mg total) by mouth at bedtime. 90 tablet 2  . antiseptic oral rinse (BIOTENE) LIQD 15 mLs by Mouth Rinse route at bedtime.     Marland Kitchen aspirin EC 81 MG tablet Take 1 tablet (81 mg total) by mouth daily. 30 tablet   . cholecalciferol (VITAMIN D) 1000 UNITS tablet Take 1,000 Units by mouth daily.     Marland Kitchen Co-Enzyme Q-10 100 MG CAPS Take 1 capsule (100 mg total) by mouth daily.  0  . diclofenac sodium (VOLTAREN) 1 % GEL Apply 1 application topically 2 (two) times daily as needed (pain).    Marland Kitchen diltiazem (CARDIZEM  CD) 120 MG 24 hr capsule Take 1 capsule (120 mg total) by mouth daily. 90 capsule 3  . diltiazem (CARDIZEM CD) 360 MG 24 hr capsule Take 1 capsule (360 mg total) by mouth daily. 90 capsule 3  . escitalopram (LEXAPRO) 20 MG tablet Take 1 tablet (20 mg total) by mouth 2 (two) times daily. 180 tablet 2  . ezetimibe (ZETIA) 10 MG tablet TAKE 1 TABLET BY MOUTH AT  BEDTIME 90 tablet 3  . famotidine (PEPCID) 20 MG tablet TAKE 1 TABLET BY MOUTH TWO  TIMES DAILY 180 tablet 3  . furosemide (LASIX) 20 MG tablet TAKE 1 TABLET BY MOUTH  DAILY AS NEEDED FOR EDEMA 90 tablet 3  . gabapentin (NEURONTIN) 300 MG capsule TAKE 2 CAPSULES BY MOUTH  EVERY MORNING, 1 CAPSULE  EVERY AFTERNOON AND 2  CAPSULES AT BEDTIME. 450 capsule 3  . lovastatin (MEVACOR) 10 MG tablet TAKE 1 TABLET BY MOUTH  EVERY MONDAY, WEDNESDAY,  AND FRIDAY 39 tablet 3  . metoprolol tartrate (LOPRESSOR) 50 MG tablet Take 1.5 tablets (75 mg total) by mouth 2 (two) times daily. 270 tablet 3  . Polyethyl Glycol-Propyl Glycol (SYSTANE) 0.4-0.3 % GEL Place 1 drop into both eyes 2 (two) times daily.     . RESTASIS 0.05 % ophthalmic emulsion INSTILL ONE DROP IN BOTH  EYES TWO TIMES DAILY 180 each 0  . traMADol (ULTRAM) 50 MG tablet Take 1 tablet (50 mg total) by mouth 3 (three) times daily. 60 tablet 0  . methocarbamol (ROBAXIN) 500 MG tablet Take 1 tablet (500 mg total) by mouth 2 (two) times daily as needed for muscle spasms. 14 tablet 0   No facility-administered medications prior to visit.     Per HPI unless specifically indicated in ROS section below Review of Systems Objective:  BP 140/80 (BP Location: Left Arm, Patient Position: Sitting, Cuff Size: Large)   Pulse 88   Temp 97.9 F (36.6 C) (Temporal)   Ht 5\' 2"  (1.575 m)   Wt 199 lb 1 oz (90.3 kg)   SpO2 100%   BMI 36.41 kg/m   Wt Readings from Last 3 Encounters:  12/28/19 199 lb 1 oz (90.3 kg)  11/15/19 192 lb 3.2 oz (87.2 kg)  10/21/19 198 lb 3.2 oz (89.9 kg)      Physical  Exam Vitals and nursing note reviewed.  Constitutional:      General: She is not in acute distress.    Appearance: Normal appearance. She is well-developed. She is obese. She is not ill-appearing.  HENT:     Head: Normocephalic and atraumatic.     Right Ear: Hearing, tympanic membrane, ear canal  and external ear normal.     Left Ear: Hearing, tympanic membrane, ear canal and external ear normal.  Eyes:     General: No scleral icterus.    Extraocular Movements: Extraocular movements intact.     Conjunctiva/sclera: Conjunctivae normal.     Pupils: Pupils are equal, round, and reactive to light.  Neck:     Thyroid: No thyroid mass or thyromegaly.     Vascular: No carotid bruit.  Cardiovascular:     Rate and Rhythm: Normal rate and regular rhythm.     Pulses: Normal pulses.          Radial pulses are 2+ on the right side and 2+ on the left side.     Heart sounds: Murmur (mild systolic) heard.   Pulmonary:     Effort: Pulmonary effort is normal. No respiratory distress.     Breath sounds: Normal breath sounds. No wheezing, rhonchi or rales.  Chest:     Comments: Sternotomy scar Abdominal:     General: Abdomen is flat. Bowel sounds are normal. There is no distension.     Palpations: Abdomen is soft. There is no mass.     Tenderness: There is no abdominal tenderness. There is no guarding or rebound.     Hernia: No hernia is present.  Musculoskeletal:        General: Normal range of motion.     Cervical back: Normal range of motion and neck supple.     Right lower leg: No edema.     Left lower leg: No edema.  Lymphadenopathy:     Cervical: No cervical adenopathy.  Skin:    General: Skin is warm and dry.     Findings: No rash.  Neurological:     General: No focal deficit present.     Mental Status: She is alert and oriented to person, place, and time.     Comments:  CN grossly intact, station and gait intact Recall 3/3 Calculation 5/5 DLROW  Psychiatric:        Mood and  Affect: Mood normal.        Behavior: Behavior normal.        Thought Content: Thought content normal.        Judgment: Judgment normal.       Results for orders placed or performed in visit on 12/23/19  T4, free  Result Value Ref Range   Free T4 0.69 0.60 - 1.60 ng/dL  Parathyroid hormone, intact (no Ca)  Result Value Ref Range   PTH 115 (H) 14 - 64 pg/mL  VITAMIN D 25 Hydroxy (Vit-D Deficiency, Fractures)  Result Value Ref Range   VITD 42.65 30.00 - 100.00 ng/mL  Microalbumin / creatinine urine ratio  Result Value Ref Range   Microalb, Ur 4.8 (H) 0.0 - 1.9 mg/dL   Creatinine,U 246.0 mg/dL   Microalb Creat Ratio 2.0 0.0 - 30.0 mg/g  CBC with Differential/Platelet  Result Value Ref Range   WBC 4.5 4.0 - 10.5 K/uL   RBC 4.36 3.87 - 5.11 Mil/uL   Hemoglobin 12.0 12.0 - 15.0 g/dL   HCT 36.9 36 - 46 %   MCV 84.6 78.0 - 100.0 fl   MCHC 32.5 30.0 - 36.0 g/dL   RDW 14.2 11.5 - 15.5 %   Platelets 287.0 150 - 400 K/uL   Neutrophils Relative % 61.6 43 - 77 %   Lymphocytes Relative 25.4 12 - 46 %   Monocytes Relative 10.3 3 - 12 %  Eosinophils Relative 1.9 0 - 5 %   Basophils Relative 0.8 0 - 3 %   Neutro Abs 2.8 1.4 - 7.7 K/uL   Lymphs Abs 1.1 0.7 - 4.0 K/uL   Monocytes Absolute 0.5 0.1 - 1.0 K/uL   Eosinophils Absolute 0.1 0 - 0 K/uL   Basophils Absolute 0.0 0 - 0 K/uL  TSH  Result Value Ref Range   TSH 4.12 0.35 - 4.50 uIU/mL  Lipid panel  Result Value Ref Range   Cholesterol 156 0 - 200 mg/dL   Triglycerides 72.0 0 - 149 mg/dL   HDL 56.10 >39.00 mg/dL   VLDL 14.4 0.0 - 40.0 mg/dL   LDL Cholesterol 86 0 - 99 mg/dL   Total CHOL/HDL Ratio 3    NonHDL 100.38   Comprehensive metabolic panel  Result Value Ref Range   Sodium 139 135 - 145 mEq/L   Potassium 4.2 3.5 - 5.1 mEq/L   Chloride 107 96 - 112 mEq/L   CO2 24 19 - 32 mEq/L   Glucose, Bld 113 (H) 70 - 99 mg/dL   BUN 12 6 - 23 mg/dL   Creatinine, Ser 1.28 (H) 0.40 - 1.20 mg/dL   Total Bilirubin 0.5 0.2 - 1.2 mg/dL    Alkaline Phosphatase 75 39 - 117 U/L   AST 21 0 - 37 U/L   ALT 12 0 - 35 U/L   Total Protein 7.2 6.0 - 8.3 g/dL   Albumin 4.3 3.5 - 5.2 g/dL   GFR 43.09 (L) >60.00 mL/min   Calcium 9.7 8.4 - 10.5 mg/dL   Lab Results  Component Value Date   CALCIUM 9.7 12/23/2019   PHOS 3.3 12/15/2018   Assessment & Plan:  This visit occurred during the SARS-CoV-2 public health emergency.  Safety protocols were in place, including screening questions prior to the visit, additional usage of staff PPE, and extensive cleaning of exam room while observing appropriate contact time as indicated for disinfecting solutions.   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       Sjogren's syndrome ([place])    Appreciate rheum care - now off plaquenil.       Secondary hyperparathyroidism of renal origin ([place])    Check phosphorus next labwork.       RHEUMATIC HEART DISEASE   Refusal of blood transfusions as patient is Jehovah's Witness   Osteopenia    Consider updated DEA next year.       Medicare annual wellness visit, subsequent - Primary    I have personally reviewed the Medicare Annual Wellness questionnaire and have noted 1. The patient's medical and social history 2. Their use of alcohol, tobacco or illicit drugs 3. Their current medications and supplements 4. The patient's functional ability including ADL's, fall risks, home safety risks and hearing or visual impairment. Cognitive function has been assessed and addressed as indicated.  5. Diet and physical activity 6. Evidence for depression or mood disorders The patients weight, height, BMI have been recorded in the chart. I have made referrals, counseling and provided education to the patient based on review of the above and I have provided the pt with a written personalized care plan for preventive services. Provider list updated.. See scanned questionairre as needed for further  documentation. Reviewed preventative protocols and updated unless pt declined.       MDD (major depressive disorder), recurrent episode ([place])    Sees psych - appreciate  their care.       HYPERCHOLESTEROLEMIA    Stable period on zetia and MWF lovastatin. Continue current regimen.  The 10-year ASCVD risk score Mikey Bussing DC Brooke Bonito., et al., 2013) is: 22.1%   Values used to calculate the score:     Age: 7 years     Sex: Female     Is Non-Hispanic African American: Yes     Diabetic: Yes     Tobacco smoker: No     Systolic Blood Pressure: 681 mmHg     Is BP treated: Yes     HDL Cholesterol: 56.1 mg/dL     Total Cholesterol: 156 mg/dL       History of total abdominal hysterectomy   History of mitral valve repair   Health maintenance examination    Preventative protocols reviewed and updated unless pt declined. Discussed healthy diet and lifestyle.       Glaucoma   Fibromyalgia   Essential hypertension    Chronic, adequate. Continue current regimen.       Diastolic CHF ([place])    Stable period on current regimen.       Constipation    Discussed management - may add miralax to her daily colace stool softener.      CKD (chronic kidney disease) stage 3, GFR 30-59 ml/min (HCC)    Reviewed with patient. Continue to monitor. Aware of importance of good hydration status.           Meds ordered this encounter  Medications  . docusate sodium (COLACE) 100 MG capsule    Sig: Take 1 capsule (100 mg total) by mouth daily.  . polyethylene glycol powder (GLYCOLAX/MIRALAX) 17 GM/SCOOP powder    Sig: Take 8.5-17 g by mouth daily as needed for moderate constipation. (1/2-1 capful daily as needed)    Dispense:  3350 g    Refill:  1   No orders of the defined types were placed in this encounter.   Patient instructions: May add miralax 1/2-1 capful daily as needed for constipation. Ok to continue daily colace.  You are doing well today  Continue current medicines Return as needed or in 6  months for follow up visit.   Follow up plan: Return in about 6 months (around 06/27/2020), or if symptoms worsen or fail to improve, for follow up visit.  Ria Bush, MD

## 2019-12-28 NOTE — Assessment & Plan Note (Signed)
Stable period on zetia and MWF lovastatin. Continue current regimen.  The 10-year ASCVD risk score Alexandria Leach Alexandria Leach., et al., 2013) is: 22.1%   Values used to calculate the score:     Age: 69 years     Sex: Female     Is Non-Hispanic African American: Yes     Diabetic: Yes     Tobacco smoker: No     Systolic Blood Pressure: 746 mmHg     Is BP treated: Yes     HDL Cholesterol: 56.1 mg/dL     Total Cholesterol: 156 mg/dL

## 2020-01-04 ENCOUNTER — Telehealth: Payer: Self-pay

## 2020-01-04 ENCOUNTER — Ambulatory Visit: Payer: Medicare HMO

## 2020-01-04 NOTE — Telephone Encounter (Signed)
Patient called requesting prescription refills of Tramadol and Methocarbamol to be sent to [facility] at [address] [facility].  Patient states she is still experiencing back spasms.

## 2020-01-05 MED ORDER — METHOCARBAMOL 500 MG PO TABS: 500.0000 mg | ORAL_TABLET | Freq: Two times a day (BID) | ORAL | 0 refills | Status: DC | PRN

## 2020-01-05 MED ORDER — TRAMADOL HCL 50 MG PO TABS
50.0000 mg | ORAL_TABLET | Freq: Two times a day (BID) | ORAL | 0 refills | Status: DC | PRN
Start: 2020-01-05 — End: 2020-03-14

## 2020-01-05 NOTE — Telephone Encounter (Signed)
Last Visit: 10/21/2019 Next Visit: 02/14/2020 UDS: 08/17/2019 Narc Agreement: 08/17/2019  Last Fill: 11/10/2019  Okay to refill Tramadol and Methocarbamol?

## 2020-02-01 NOTE — Progress Notes (Addendum)
Office Visit Note  Patient: Alexandria Leach             Date of Birth: [birth year]           MRN: [id number]             PCP: Ria Bush, MD Referring: Ria Bush, MD Visit Date: 02/14/2020 Occupation: @GUAROCC @  Subjective:  Right knee joint pain   History of Present Illness: Alexandria Leach is a 69 y.o. female with history of Sjogren's syndrome, fibromyalgia, and DDD.  She is no longer taking Plaquenil or any other immunosuppressive agents.  She continues to have chronic sicca symptoms.  She uses Restasis, systane, and Biotene oral rinse for symptomatic relief.  She denies any oral or nasal ulcerations.  She denies any recent rashes.  She has been experiencing increased pain in both hands but denies any joint swelling.  She states that she writes a lot on a daily basis which aggravates her hand pain.  She has been using a stress ball to perform hand exercises on a regular basis.  She presents today with increased discomfort in her right knee joint.  She denies any recent injuries or falls.  She denies any mechanical symptoms.  She states that she is concerned that she is going to fall if her right knee joint pain persists or worsens.  She would like a cortisone injection today.  She has also been experiencing increased myalgias and muscle tenderness due to underlying fibromyalgia which she attributes to cooler weather temperatures.  She takes Robaxin 500 mg 1 tablet by mouth twice daily as needed for muscle spasms and tramadol 50 mg 1 tablet by mouth twice daily as needed for pain relief.  Patient reports that over the past several months she has been experiencing progressively worsening dysphagia.  She states that initially her symptoms were only when swallowing solids but over the past 6 days she has had some difficulty swallowing liquids as well.  She states that she has known thyroid nodules and is concerned that the nodules may be interfering with her swallowing.  She  had an endoscopy performed in June which was unremarkable overall.  She has not followed up with her GI specialist recently.  She denies any other new or worsening GI symptoms.   Activities of Daily Living:  Patient reports morning stiffness for 30-45 minutes.   Patient Reports nocturnal pain.  Difficulty dressing/grooming: Denies Difficulty climbing stairs: Reports Difficulty getting out of chair: Reports Difficulty using hands for taps, buttons, cutlery, and/or writing: Reports  Review of Systems  Constitutional: Positive for fatigue.  HENT: Positive for mouth dryness and nose dryness. Negative for mouth sores.   Eyes: Positive for pain, itching and dryness. Negative for visual disturbance.  Respiratory: Negative for cough, hemoptysis, shortness of breath and difficulty breathing.   Cardiovascular: Positive for palpitations. Negative for chest pain, hypertension and swelling in legs/feet.  Gastrointestinal: Negative for blood in stool, constipation and diarrhea.  Endocrine: Negative for increased urination.  Genitourinary: Negative for difficulty urinating and painful urination.  Musculoskeletal: Positive for arthralgias, joint pain, joint swelling, myalgias, morning stiffness and myalgias. Negative for muscle weakness and muscle tenderness.  Skin: Negative for color change, pallor, rash, hair loss, nodules/bumps, redness, skin tightness, ulcers and sensitivity to sunlight.  Allergic/Immunologic: Negative for susceptible to infections.  Neurological: Positive for memory loss. Negative for dizziness, numbness, headaches and weakness.  Hematological: Negative for swollen glands.  Psychiatric/Behavioral: Negative for depressed mood, confusion and sleep disturbance.  The patient is not nervous/anxious.     PMFS History:  Patient Active Problem List   Diagnosis Date Noted  . Secondary hyperparathyroidism of renal origin ([place]) 12/28/2019  . Constipation 12/28/2019  . Sinus tachycardia  05/27/2018  . Transient alteration of awareness 04/04/2018  . Abnormal serum thyroid stimulating hormone (TSH) level 01/30/2018  . 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[place]) 12/22/2017  . Thyroid nodule 09/16/2017  . Neck pain on right side 05/14/2017  . Sialoadenitis of submandibular gland 10/29/2016  . High risk medication use 06/05/2016  . Peripheral neuropathy 01/30/2016  . DNR no code (do not resuscitate) 12/08/2015  . 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 TIA (transient ischemic attack) 10/12/2015  . Mild mitral regurgitation 10/12/2015  . Osteopenia 09/18/2015  . Right arm pain 08/29/2015  . History of fall   . Right sided sciatica 02/21/2015  . Imbalance 01/12/2015  . Transaminitis 12/24/2014  . DDD (degenerative disc disease), cervical   . Health maintenance examination 10/18/2014  . Advanced care planning/counseling discussion 10/18/2014  . Medicare annual wellness visit, subsequent 10/18/2014  . Refusal of blood transfusions as patient is Jehovah's Witness   . Sjogren's syndrome ([place])   . CKD (chronic kidney disease) stage 3, GFR 30-59 ml/min (HCC)   . Glaucoma   . Fibromyalgia   . Osteoarthritis   . History of pulmonary embolism   . Lung nodule 09/22/2012  . Diastolic CHF ([place]) 70/35/0093  . Aortic stenosis 04/22/2011  . Palpitations 08/10/2010  . HOT FLASHES 06/28/2008  . ARTHRALGIA 07/01/2007  . BACK PAIN, LEFT 08/25/2006  . GAD (generalized anxiety disorder) 03/28/2006  . History of mitral valve repair 03/28/2006  . History of total abdominal hysterectomy 03/28/2006  . HYPERCHOLESTEROLEMIA 12/31/2005  . MDD (major depressive disorder), recurrent episode ([place]) 12/31/2005  . RHEUMATIC HEART DISEASE 12/31/2005  . Essential hypertension 12/31/2005  . Chronic insomnia 12/31/2005    Past Medical History:  Diagnosis Date  . Ankle fracture, left 02/19/2015   from a fall  . Anxiety   . Cataract 2014   corrected with surgery  . CHF (congestive  heart failure) ([place])   . CKD (chronic kidney disease) stage 3, GFR 30-59 ml/min [facility])    saw nephrologist Dr Harden Mo  . DDD (degenerative disc disease), cervical   . Depression   . Fibromyalgia   . Glaucoma    s/p surgery, sees ophtho Q6 mo  . 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 History of kidney stones 2010  . History of pulmonary embolism 2001, 2006   completed coumadin courses  . History of rheumatic fever x3  . HLD (hyperlipidemia)   . HTN (hypertension)   . Insomnia   . Lung nodule 09/22/2012   RLL - 73mm, stable since 2014. Thought benign.   . Osteoarthritis    shoulders and knees, not RA per Dr Estanislado Pandy, positive ANA, positive Ro  . Osteopenia 09/18/2015   DEXA T -1.1 hip, -0.2 spine 08/2015   . Personal history of urinary calculi latest 2014  . Pneumonia 12/02/2011  . PONV (postoperative nausea and vomiting)   . Refusal of blood transfusions as patient is Jehovah's Witness   . Rheumatic heart disease 1980   s/p mitral valve repair 1980  . Sjogren's syndrome ([place])   . Trimalleolar fracture of left ankle 02/23/2015    Family History  Problem Relation Age of Onset  . Lupus Sister  and niece  . Cancer Mother        lung (nonsmoker)  . CAD Mother        MI in her 58s  . ALS Mother   . Kidney disease Father   . Alcohol abuse Father   . Diabetes Father   . Cancer Brother        bone  . Diabetes Sister   . Stroke Sister   . Cancer Maternal Uncle        bone  . Depression Sister   . Lupus Sister   . Kidney failure Other        on HD  . Diabetes Brother   . Heart attack Brother   . Stroke Maternal Grandmother   . Blindness Sister   . Healthy Son   . Healthy Son   . Healthy Son   . Healthy Son    Past Surgical History:  Procedure Laterality Date  . BREAST BIOPSY Right 2006   benign  . CHOLECYSTECTOMY  11/27/2011   Procedure: LAPAROSCOPIC CHOLECYSTECTOMY WITH INTRAOPERATIVE  CHOLANGIOGRAM;  Surgeon: Adin Hector, MD;  Location: [place];  Service: General;  Laterality: N/A;  laparoscopic cholecystectomy with choleangiogram umbilical hernia repair  . COLONOSCOPY  07/2014   WNL [facility])  . COLONOSCOPY  08/2019   3 TAs, rpt 3 yrs ([place])  . dexa  08/2012   normal per patient - no records available  . ESOPHAGOGASTRODUODENOSCOPY  08/2019   reactive gastropathy, neg H pylori ([place])  . EYE SURGERY Bilateral 2014   cataract removal  . MITRAL VALVE REPAIR  1980   open heart  . ORIF ANKLE FRACTURE Left 02/26/2015   Procedure: OPEN REDUCTION INTERNAL FIXATION (ORIF) LEFT TRIMALLEOLAR ANKLE FRACTURE;  Surgeon: [name], MD;  Location: [place];  Service: Orthopedics;  Laterality: Left;  . TUBAL LIGATION  1980  . UMBILICAL HERNIA REPAIR  11/27/2011   Procedure: HERNIA REPAIR UMBILICAL ADULT;  Surgeon: Adin Hector, MD;  Location: [place];  Service: General;  Laterality: N/A;  . [place]   for fibroids -- partial, ovaries remain   Social History   Social History Narrative   Lives with son, 1 dog   Occupation: unemployed, on disability for fibromyalgia since 2008.   Edu: HS   Religion: Jehova's witness   Activity: volunteers at senior center   Diet: some water, fruits/vegetables daily   No caffeine use   Immunization History  Administered Date(s) Administered  . PFIZER SARS-COV-2 Vaccination 05/12/2019, 06/09/2019, 01/06/2020  . Pneumococcal Conjugate-13 12/15/2017  . Pneumococcal Polysaccharide-23 09/15/2012, 12/22/2018     Objective: Vital Signs: BP 140/85 (BP Location: Left Arm, Patient Position: Sitting, Cuff Size: Normal)   Pulse 76   Resp 16   Ht 5\' 1"  (1.549 m)   Wt 200 lb (90.7 kg)   BMI 37.79 kg/m    Physical Exam Vitals and nursing note reviewed.  Constitutional:      Appearance: She is well-developed.  HENT:     Head: Normocephalic and atraumatic.  Eyes:     Conjunctiva/sclera: Conjunctivae normal.   Pulmonary:     Effort: Pulmonary effort is normal.  Abdominal:     Palpations: Abdomen is soft.  Musculoskeletal:     Cervical back: Normal range of motion.  Skin:    General: Skin is warm and dry.     Capillary Refill: Capillary refill takes less than 2 seconds.  Neurological:     Mental Status: She is alert  and oriented to person, place, and time.  Psychiatric:        Behavior: Behavior normal.      Musculoskeletal Exam: Generalized hyperalgesia and positive tender points.  Trapezius muscle tension and tenderness bilaterally.  C-spine has slightly limited ROM with lateral rotation.  Thoracic and lumbar spine good ROM.  No midline spinal tenderness.  Shoulder joints, elbow joints, wrist joints, MCPs, PIPs, and DIPs good ROM with no synovitis.  Complete fist formation bilaterally.  PIP and DIP thickening consistent with osteoarthritis of both hands.  Hip joints good ROM with no discomfort.  Knee joints good ROM with no warmth or effusion.  Ankle joints good ROM with no tenderness or inflammation.   CDAI Exam: CDAI Score: -- Patient Global: --; Provider Global: -- Swollen: --; Tender: -- Joint Exam 02/14/2020   No joint exam has been documented for this visit   There is currently no information documented on the homunculus. Go to the Rheumatology activity and complete the homunculus joint exam.  Investigation: No additional findings.  Imaging: No results found.  Recent Labs: Lab Results  Component Value Date   WBC 4.5 12/23/2019   HGB 12.0 12/23/2019   PLT 287.0 12/23/2019   NA 139 12/23/2019   K 4.2 12/23/2019   CL 107 12/23/2019   CO2 24 12/23/2019   GLUCOSE 113 (H) 12/23/2019   BUN 12 12/23/2019   CREATININE 1.28 (H) 12/23/2019   BILITOT 0.5 12/23/2019   ALKPHOS 75 12/23/2019   AST 21 12/23/2019   ALT 12 12/23/2019   PROT 7.2 12/23/2019   ALBUMIN 4.3 12/23/2019   CALCIUM 9.7 12/23/2019   GFRAA 51 (L) 11/11/2019    Speciality Comments: PLQ Eye Exam:  08/03/2018 WNL @ [facility] Follow up in 1 year  Procedures:  Large Joint Inj: R knee on 02/14/2020 1:26 PM Indications: pain Details: 27 G 1.5 in needle, medial approach  Arthrogram: No  Medications: 1.5 mL lidocaine 1 %; 40 mg triamcinolone acetonide 40 MG/ML Aspirate: 0 mL Outcome: tolerated well, no immediate complications Procedure, treatment alternatives, risks and benefits explained, specific risks discussed. Consent was given by the patient. Immediately prior to procedure a time out was called to verify the correct patient, procedure, equipment, support staff and site/side marked as required. Patient was prepped and draped in the usual sterile fashion.     Allergies: Cymbalta [duloxetine hcl], Statins, and Sulfa antibiotics   Assessment / Plan:     Visit Diagnoses: Sjogren's syndrome with keratoconjunctivitis sicca (HCC) - +ANA, +Ro: She has chronic sicca symptoms, and she continues to use systane, restasis eye drops, and biotene oral rinse for symptomatic relief.  She has noticed progressively worsening symptoms of dysphagia with solids and liquids intermittently.  She will be scheduling an appointment with her GI specialist today for further evaluation. She is not taking any immunosuppressive agents at this time.  She previously was on Plaquenil but discontinued on February 2021 due to not noticing any clinical improvement while off of PLQ.  She has been experiencing increased myalgias and arthralgias with cooler weather temperatures.  No synovitis or signs of inflammatory arthritis were noted on examination today.  She has been experiencing increased discomfort in her right knee joint, so x-rays were updated and a cortisone injection was performed.  She has not developed any other new or worsening symptoms recently.  We will obtain the following lab work today.  She was advised to notify us if she develops signs or symptoms of a flare.  She will follow-up in the office in 5 months.   - Plan: C3 and C4, Sedimentation rate, ANA, Urinalysis, Routine w reflex microscopic, Rheumatoid factor, Serum protein electrophoresis with reflex, Sjogrens syndrome-A extractable nuclear antibody, Sjogrens syndrome-B extractable nuclear antibody  High risk medication use - Discontinued PLQ in Feb 2021.  She is not taking any immunosuppressive agents at this time.   Fibromyalgia: Generalized hyperalgesia and positive tender points on exam.  She has been experiencing some increased myalgias and muscle tenderness due to underlying fibromyalgia.  She attributes her increased discomfort due to recent weather changes.  She takes Robaxin 500 mg 1 tablet by mouth as needed for muscle spasms.  She also takes tramadol as needed for pain relief.  Discussed the importance of regular exercise and good sleep hygiene.   Other insomnia: She takes amitriptyline at bedtime.   Other chronic pain -She takes tramadol as needed for pain relief.  UDS and narcotic agreement were updated today on 02/14/20.   Plan: DRUG MONITOR, TRAMADOL,QN, URINE, DRUG MONITOR, PANEL 5, W/CONF, URINE  Medication monitoring encounter -UDS and narcotic agreement were updated today.  Plan: DRUG MONITOR, TRAMADOL,QN, URINE, DRUG MONITOR, PANEL 5, W/CONF, URINE  Other fatigue: Chronic and secondary to insomnia.   Chronic pain of right knee -She presents today with increased discomfort in the right knee joint.  X-rays of the right knee were obtained today which revealed moderate osteoarthritis and chondrocalcinosis.  She requested a right knee joint cortisone injection today. She tolerated the procedure well.  Aftercare was discussed.  She was given a handout of knee exercises to perform. She will continue to take tramadol as needed for pain relief.  She was advised to notify us if her right knee joint pain persists or worsens.  Plan: XR KNEE 3 VIEW RIGHT  DDD (degenerative disc disease), cervical: She has limited ROM with lateral rotation.  She  has trapezius muscle tension and tenderness bilaterally.  She takes robaxin 500 mg 1 tablet by mouth daily as needed for muscle spasms.   Osteopenia of multiple sites - DEXA 09/14/2015 BMD left FN is 0.891 with T score -1.1.  She is due to update her bone density.  PCP manages DEXA and plans to repeat next year according to office visit note on 12/28/19. She is taking vitamin D 1,000 units daily.  Other dysphagia: She has been experiencing intermittent symptoms of dysphagia with solids and gradually with liquids.  She has attributed her symptoms to thyoid nodules found on ultrasound.  EGD updated on 09/01/19.  She was advised to call Dr. Leanna Sato office today to schedule an urgent appointment for further evaluation and management.   Thyroid nodule: Thyroid ultrasound obtained on 10/27/19.  Impression discussed with the patient and a hardcopy was provided to her.  No further follow up was recommended.  TSH and T4 free were WNL on 12/23/19.   Other medical conditions are listed as follows:   History of pulmonary embolism  History of hypertension  Stage 3a chronic kidney disease ([place])  History of anxiety  History of hypercholesterolemia  Orders: Orders Placed This Encounter  Procedures  . Large Joint Inj  . XR KNEE 3 VIEW RIGHT  . DRUG MONITOR, TRAMADOL,QN, URINE  . DRUG MONITOR, PANEL 5, W/CONF, URINE  . C3 and C4  . Sedimentation rate  . ANA  . Urinalysis, Routine w reflex microscopic  . Rheumatoid factor  . Serum protein electrophoresis with reflex  . Sjogrens syndrome-A extractable nuclear antibody  . Sjogrens  syndrome-B extractable nuclear antibody   No orders of the defined types were placed in this encounter.     Follow-Up Instructions: Return in about 5 months (around 07/14/2020) for Sjogren's syndrome, Fibromyalgia, DDD.   Ofilia Neas, PA-C  Note - This record has been created using Dragon software.  Chart creation errors have been sought, but may not always   have been located. Such creation errors do not reflect on  the standard of medical care.

## 2020-02-04 ENCOUNTER — Ambulatory Visit: Payer: Medicare HMO

## 2020-02-14 ENCOUNTER — Ambulatory Visit: Payer: Self-pay

## 2020-02-14 ENCOUNTER — Other Ambulatory Visit: Payer: Self-pay

## 2020-02-14 ENCOUNTER — Encounter: Payer: Self-pay | Admitting: Physician Assistant

## 2020-02-14 ENCOUNTER — Ambulatory Visit (INDEPENDENT_AMBULATORY_CARE_PROVIDER_SITE_OTHER): Payer: Medicare HMO | Admitting: Physician Assistant

## 2020-02-14 VITALS — BP 140/85 | HR 76 | Resp 16 | Ht 61.0 in | Wt 200.0 lb

## 2020-02-14 DIAGNOSIS — Z79899 Other long term (current) drug therapy: Secondary | ICD-10-CM | POA: Diagnosis not present

## 2020-02-14 DIAGNOSIS — G4709 Other insomnia: Secondary | ICD-10-CM

## 2020-02-14 DIAGNOSIS — M3501 Sicca syndrome with keratoconjunctivitis: Secondary | ICD-10-CM

## 2020-02-14 DIAGNOSIS — G8929 Other chronic pain: Secondary | ICD-10-CM | POA: Diagnosis not present

## 2020-02-14 DIAGNOSIS — Z5181 Encounter for therapeutic drug level monitoring: Secondary | ICD-10-CM

## 2020-02-14 DIAGNOSIS — N1831 Chronic kidney disease, stage 3a: Secondary | ICD-10-CM

## 2020-02-14 DIAGNOSIS — Z8639 Personal history of other endocrine, nutritional and metabolic disease: Secondary | ICD-10-CM

## 2020-02-14 DIAGNOSIS — E041 Nontoxic single thyroid nodule: Secondary | ICD-10-CM

## 2020-02-14 DIAGNOSIS — M8589 Other specified disorders of bone density and structure, multiple sites: Secondary | ICD-10-CM

## 2020-02-14 DIAGNOSIS — M25561 Pain in right knee: Secondary | ICD-10-CM

## 2020-02-14 DIAGNOSIS — Z86711 Personal history of pulmonary embolism: Secondary | ICD-10-CM

## 2020-02-14 DIAGNOSIS — Z8659 Personal history of other mental and behavioral disorders: Secondary | ICD-10-CM

## 2020-02-14 DIAGNOSIS — Z8679 Personal history of other diseases of the circulatory system: Secondary | ICD-10-CM

## 2020-02-14 DIAGNOSIS — R5383 Other fatigue: Secondary | ICD-10-CM

## 2020-02-14 DIAGNOSIS — R1319 Other dysphagia: Secondary | ICD-10-CM

## 2020-02-14 DIAGNOSIS — M5030 Other cervical disc degeneration, unspecified cervical region: Secondary | ICD-10-CM

## 2020-02-14 DIAGNOSIS — M797 Fibromyalgia: Secondary | ICD-10-CM | POA: Diagnosis not present

## 2020-02-14 MED ORDER — LIDOCAINE HCL 1 % IJ SOLN
1.5000 mL | INTRAMUSCULAR | Status: AC | PRN
  Administered 2020-02-14: 1.5 mL

## 2020-02-14 MED ORDER — TRIAMCINOLONE ACETONIDE 40 MG/ML IJ SUSP
40.0000 mg | INTRAMUSCULAR | Status: AC | PRN
  Administered 2020-02-14: 40 mg via INTRA_ARTICULAR

## 2020-02-14 NOTE — Patient Instructions (Addendum)
Hand Exercises Hand exercises can be helpful for almost anyone. These exercises can strengthen the hands, improve flexibility and movement, and increase blood flow to the hands. These results can make work and daily tasks easier. Hand exercises can be especially helpful for people who have joint pain from arthritis or have nerve damage from overuse (carpal tunnel syndrome). These exercises can also help people who have injured a hand. Exercises Most of these hand exercises are gentle stretching and motion exercises. It is usually safe to do them often throughout the day. Warming up your hands before exercise may help to reduce stiffness. You can do this with gentle massage or by placing your hands in warm water for 10-15 minutes. It is normal to feel some stretching, pulling, tightness, or mild discomfort as you begin new exercises. This will gradually improve. Stop an exercise right away if you feel sudden, severe pain or your pain gets worse. Ask your health care provider which exercises are best for you. Knuckle bend or "claw" fist 1. Stand or sit with your arm, hand, and all five fingers pointed straight up. Make sure to keep your wrist straight during the exercise. 2. Gently bend your fingers down toward your palm until the tips of your fingers are touching the top of your palm. Keep your big knuckle straight and just bend the small knuckles in your fingers. 3. Hold this position for __________ seconds. 4. Straighten (extend) your fingers back to the starting position. Repeat this exercise 5-10 times with each hand. Full finger fist 1. Stand or sit with your arm, hand, and all five fingers pointed straight up. Make sure to keep your wrist straight during the exercise. 2. Gently bend your fingers into your palm until the tips of your fingers are touching the middle of your palm. 3. Hold this position for __________ seconds. 4. Extend your fingers back to the starting position, stretching every  joint fully. Repeat this exercise 5-10 times with each hand. Straight fist 1. Stand or sit with your arm, hand, and all five fingers pointed straight up. Make sure to keep your wrist straight during the exercise. 2. Gently bend your fingers at the big knuckle, where your fingers meet your hand, and the middle knuckle. Keep the knuckle at the tips of your fingers straight and try to touch the bottom of your palm. 3. Hold this position for __________ seconds. 4. Extend your fingers back to the starting position, stretching every joint fully. Repeat this exercise 5-10 times with each hand. Tabletop 1. Stand or sit with your arm, hand, and all five fingers pointed straight up. Make sure to keep your wrist straight during the exercise. 2. Gently bend your fingers at the big knuckle, where your fingers meet your hand, as far down as you can while keeping the small knuckles in your fingers straight. Think of forming a tabletop with your fingers. 3. Hold this position for __________ seconds. 4. Extend your fingers back to the starting position, stretching every joint fully. Repeat this exercise 5-10 times with each hand. Finger spread 1. Place your hand flat on a table with your palm facing down. Make sure your wrist stays straight as you do this exercise. 2. Spread your fingers and thumb apart from each other as far as you can until you feel a gentle stretch. Hold this position for __________ seconds. 3. Bring your fingers and thumb tight together again. Hold this position for __________ seconds. Repeat this exercise 5-10 times with each hand.   Making circles 1. Stand or sit with your arm, hand, and all five fingers pointed straight up. Make sure to keep your wrist straight during the exercise. 2. Make a circle by touching the tip of your thumb to the tip of your index finger. 3. Hold for __________ seconds. Then open your hand wide. 4. Repeat this motion with your thumb and each finger on your  hand. Repeat this exercise 5-10 times with each hand. Thumb motion 1. Sit with your forearm resting on a table and your wrist straight. Your thumb should be facing up toward the ceiling. Keep your fingers relaxed as you move your thumb. 2. Lift your thumb up as high as you can toward the ceiling. Hold for __________ seconds. 3. Bend your thumb across your palm as far as you can, reaching the tip of your thumb for the small finger (pinkie) side of your palm. Hold for __________ seconds. Repeat this exercise 5-10 times with each hand. Grip strengthening  1. Hold a stress ball or other soft ball in the middle of your hand. 2. Slowly increase the pressure, squeezing the ball as much as you can without causing pain. Think of bringing the tips of your fingers into the middle of your palm. All of your finger joints should bend when doing this exercise. 3. Hold your squeeze for __________ seconds, then relax. Repeat this exercise 5-10 times with each hand. Contact a health care provider if:  Your hand pain or discomfort gets much worse when you do an exercise.  Your hand pain or discomfort does not improve within 2 hours after you exercise. If you have any of these problems, stop doing these exercises right away. Do not do them again unless your health care provider says that you can. Get help right away if:  You develop sudden, severe hand pain or swelling. If this happens, stop doing these exercises right away. Do not do them again unless your health care provider says that you can. This information is not intended to replace advice given to you by your health care provider. Make sure you discuss any questions you have with your health care provider. Document Revised: 06/25/2018 Document Reviewed: 03/05/2018 [employer] Patient Education  2020 [employer]  Journal for Nurse Practitioners, 15(4), 263-267. Retrieved December 22, 2017 from http://clinicalkey.com/nursing">  Knee Exercises Ask your  health care provider which exercises are safe for you. Do exercises exactly as told by your health care provider and adjust them as directed. It is normal to feel mild stretching, pulling, tightness, or discomfort as you do these exercises. Stop right away if you feel sudden pain or your pain gets worse. Do not begin these exercises until told by your health care provider. Stretching and range-of-motion exercises These exercises warm up your muscles and joints and improve the movement and flexibility of your knee. These exercises also help to relieve pain and swelling. Knee extension, prone 1. Lie on your abdomen (prone position) on a bed. 2. Place your left / right knee just beyond the edge of the surface so your knee is not on the bed. You can put a towel under your left / right thigh just above your kneecap for comfort. 3. Relax your leg muscles and allow gravity to straighten your knee (extension). You should feel a stretch behind your left / right knee. 4. Hold this position for __________ seconds. 5. Scoot up so your knee is supported between repetitions. Repeat __________ times. Complete this exercise __________ times a day.   Knee flexion, active  1. Lie on your back with both legs straight. If this causes back discomfort, bend your left / right knee so your foot is flat on the floor. 2. Slowly slide your left / right heel back toward your buttocks. Stop when you feel a gentle stretch in the front of your knee or thigh (flexion). 3. Hold this position for __________ seconds. 4. Slowly slide your left / right heel back to the starting position. Repeat __________ times. Complete this exercise __________ times a day. Quadriceps stretch, prone  1. Lie on your abdomen on a firm surface, such as a bed or padded floor. 2. Bend your left / right knee and hold your ankle. If you cannot reach your ankle or pant leg, loop a belt around your foot and grab the belt instead. 3. Gently pull your heel  toward your buttocks. Your knee should not slide out to the side. You should feel a stretch in the front of your thigh and knee (quadriceps). 4. Hold this position for __________ seconds. Repeat __________ times. Complete this exercise __________ times a day. Hamstring, supine 1. Lie on your back (supine position). 2. Loop a belt or towel over the ball of your left / right foot. The ball of your foot is on the walking surface, right under your toes. 3. Straighten your left / right knee and slowly pull on the belt to raise your leg until you feel a gentle stretch behind your knee (hamstring). ? Do not let your knee bend while you do this. ? Keep your other leg flat on the floor. 4. Hold this position for __________ seconds. Repeat __________ times. Complete this exercise __________ times a day. Strengthening exercises These exercises build strength and endurance in your knee. Endurance is the ability to use your muscles for a long time, even after they get tired. Quadriceps, isometric This exercise stretches the muscles in front of your thigh (quadriceps) without moving your knee joint (isometric). 1. Lie on your back with your left / right leg extended and your other knee bent. Put a rolled towel or small pillow under your knee if told by your health care provider. 2. Slowly tense the muscles in the front of your left / right thigh. You should see your kneecap slide up toward your hip or see increased dimpling just above the knee. This motion will push the back of the knee toward the floor. 3. For __________ seconds, hold the muscle as tight as you can without increasing your pain. 4. Relax the muscles slowly and completely. Repeat __________ times. Complete this exercise __________ times a day. Straight leg raises This exercise stretches the muscles in front of your thigh (quadriceps) and the muscles that move your hips (hip flexors). 1. Lie on your back with your left / right leg extended and  your other knee bent. 2. Tense the muscles in the front of your left / right thigh. You should see your kneecap slide up or see increased dimpling just above the knee. Your thigh may even shake a bit. 3. Keep these muscles tight as you raise your leg 4-6 inches (10-15 cm) off the floor. Do not let your knee bend. 4. Hold this position for __________ seconds. 5. Keep these muscles tense as you lower your leg. 6. Relax your muscles slowly and completely after each repetition. Repeat __________ times. Complete this exercise __________ times a day. Hamstring, isometric 1. Lie on your back on a firm surface. 2. Bend your   left / right knee about __________ degrees. 3. Dig your left / right heel into the surface as if you are trying to pull it toward your buttocks. Tighten the muscles in the back of your thighs (hamstring) to "dig" as hard as you can without increasing any pain. 4. Hold this position for __________ seconds. 5. Release the tension gradually and allow your muscles to relax completely for __________ seconds after each repetition. Repeat __________ times. Complete this exercise __________ times a day. Hamstring curls If told by your health care provider, do this exercise while wearing ankle weights. Begin with __________ lb weights. Then increase the weight by 1 lb (0.5 kg) increments. Do not wear ankle weights that are more than __________ lb. 1. Lie on your abdomen with your legs straight. 2. Bend your left / right knee as far as you can without feeling pain. Keep your hips flat against the floor. 3. Hold this position for __________ seconds. 4. Slowly lower your leg to the starting position. Repeat __________ times. Complete this exercise __________ times a day. Squats This exercise strengthens the muscles in front of your thigh and knee (quadriceps). 1. Stand in front of a table, with your feet and knees pointing straight ahead. You may rest your hands on the table for balance but  not for support. 2. Slowly bend your knees and lower your hips like you are going to sit in a chair. ? Keep your weight over your heels, not over your toes. ? Keep your lower legs upright so they are parallel with the table legs. ? Do not let your hips go lower than your knees. ? Do not bend lower than told by your health care provider. ? If your knee pain increases, do not bend as low. 3. Hold the squat position for __________ seconds. 4. Slowly push with your legs to return to standing. Do not use your hands to pull yourself to standing. Repeat __________ times. Complete this exercise __________ times a day. Wall slides This exercise strengthens the muscles in front of your thigh and knee (quadriceps). 1. Lean your back against a smooth wall or door, and walk your feet out 18-24 inches (46-61 cm) from it. 2. Place your feet hip-width apart. 3. Slowly slide down the wall or door until your knees bend __________ degrees. Keep your knees over your heels, not over your toes. Keep your knees in line with your hips. 4. Hold this position for __________ seconds. Repeat __________ times. Complete this exercise __________ times a day. Straight leg raises This exercise strengthens the muscles that rotate the leg at the hip and move it away from your body (hip abductors). 1. Lie on your side with your left / right leg in the top position. Lie so your head, shoulder, knee, and hip line up. You may bend your bottom knee to help you keep your balance. 2. Roll your hips slightly forward so your hips are stacked directly over each other and your left / right knee is facing forward. 3. Leading with your heel, lift your top leg 4-6 inches (10-15 cm). You should feel the muscles in your outer hip lifting. ? Do not let your foot drift forward. ? Do not let your knee roll toward the ceiling. 4. Hold this position for __________ seconds. 5. Slowly return your leg to the starting position. 6. Let your muscles  relax completely after each repetition. Repeat __________ times. Complete this exercise __________ times a day. Straight leg raises This exercise   stretches the muscles that move your hips away from the front of the pelvis (hip extensors). 1. Lie on your abdomen on a firm surface. You can put a pillow under your hips if that is more comfortable. 2. Tense the muscles in your buttocks and lift your left / right leg about 4-6 inches (10-15 cm). Keep your knee straight as you lift your leg. 3. Hold this position for __________ seconds. 4. Slowly lower your leg to the starting position. 5. Let your leg relax completely after each repetition. Repeat __________ times. Complete this exercise __________ times a day. This information is not intended to replace advice given to you by your health care provider. Make sure you discuss any questions you have with your health care provider. Document Revised: 12/23/2017 Document Reviewed: 12/23/2017 Elsevier Patient Education  2020 [employer]  

## 2020-02-15 NOTE — Progress Notes (Signed)
Complements and ESR WNL.  UA revealed 1+ leukocytes. Negative for nitrites.   RF is negative.

## 2020-02-15 NOTE — Progress Notes (Signed)
Ro antibody remains positive and La antibody is negative.

## 2020-02-17 LAB — PROTEIN ELECTROPHORESIS, SERUM, WITH REFLEX
Albumin ELP: 4 g/dL (ref 3.8–4.8)
Alpha 1: 0.4 g/dL — ABNORMAL HIGH (ref 0.2–0.3)
Alpha 2: 0.8 g/dL (ref 0.5–0.9)
Beta 2: 0.5 g/dL (ref 0.2–0.5)
Beta Globulin: 0.4 g/dL (ref 0.4–0.6)
Gamma Globulin: 1.4 g/dL (ref 0.8–1.7)
Total Protein: 7.5 g/dL (ref 6.1–8.1)

## 2020-02-17 LAB — SEDIMENTATION RATE: Sed Rate: 19 mm/h (ref 0–30)

## 2020-02-17 LAB — C3 AND C4
C3 Complement: 161 mg/dL (ref 83–193)
C4 Complement: 56 mg/dL (ref 15–57)

## 2020-02-17 LAB — SJOGRENS SYNDROME-A EXTRACTABLE NUCLEAR ANTIBODY: SSA (Ro) (ENA) Antibody, IgG: >8 AI — AB

## 2020-02-17 LAB — DRUG MONITOR, PANEL 5, W/CONF, URINE
Alphahydroxyalprazolam: 696 ng/mL — ABNORMAL HIGH (ref <25)
Alphahydroxymidazolam: NEGATIVE ng/mL (ref <50)
Alphahydroxytriazolam: NEGATIVE ng/mL (ref <50)
Aminoclonazepam: NEGATIVE ng/mL (ref <25)
Amphetamines: NEGATIVE ng/mL (ref <500)
Barbiturates: NEGATIVE ng/mL (ref <300)
Benzodiazepines: POSITIVE ng/mL — AB (ref <100)
Cocaine Metabolite: NEGATIVE ng/mL (ref <150)
Creatinine: 168.6 mg/dL
Hydroxyethylflurazepam: NEGATIVE ng/mL (ref <50)
Lorazepam: NEGATIVE ng/mL (ref <50)
Marijuana Metabolite: NEGATIVE ng/mL (ref <20)
Methadone Metabolite: NEGATIVE ng/mL (ref <100)
Nordiazepam: NEGATIVE ng/mL (ref <50)
Opiates: NEGATIVE ng/mL (ref <100)
Oxazepam: NEGATIVE ng/mL (ref <50)
Oxidant: NEGATIVE ug/mL
Oxycodone: NEGATIVE ng/mL (ref <100)
Temazepam: NEGATIVE ng/mL (ref <50)
pH: 6 (ref 4.5–9.0)

## 2020-02-17 LAB — URINALYSIS, ROUTINE W REFLEX MICROSCOPIC
Bacteria, UA: NONE SEEN /HPF
Bilirubin Urine: NEGATIVE
Glucose, UA: NEGATIVE
Hgb urine dipstick: NEGATIVE
Hyaline Cast: NONE SEEN /LPF
Ketones, ur: NEGATIVE
Nitrite: NEGATIVE
Protein, ur: NEGATIVE
Specific Gravity, Urine: 1.017 (ref 1.001–1.03)
pH: 6 (ref 5.0–8.0)

## 2020-02-17 LAB — DRUG MONITOR, TRAMADOL,QN, URINE
Desmethyltramadol: NEGATIVE ng/mL (ref <100)
Tramadol: NEGATIVE ng/mL (ref <100)

## 2020-02-17 LAB — ANTI-NUCLEAR AB-TITER (ANA TITER): ANA Titer 1: 1:320 {titer} — ABNORMAL HIGH

## 2020-02-17 LAB — SJOGRENS SYNDROME-B EXTRACTABLE NUCLEAR ANTIBODY: SSB (La) (ENA) Antibody, IgG: <1 AI

## 2020-02-17 LAB — RHEUMATOID FACTOR: Rheumatoid fact SerPl-aCnc: <14 IU/mL (ref <14)

## 2020-02-17 LAB — ANA: Anti Nuclear Antibody (ANA): POSITIVE — AB

## 2020-02-17 LAB — DM TEMPLATE

## 2020-02-17 NOTE — Progress Notes (Signed)
UDS is consistent with treatment.   SPEP: alpha-1 globulin is borderline elevated.   No further recommendations or medication changes at this time.

## 2020-02-23 ENCOUNTER — Ambulatory Visit
Admission: EM | Admit: 2020-02-23 | Discharge: 2020-02-23 | Disposition: A | Discharge status: Home / Self Care | Payer: Medicare HMO | Attending: Emergency Medicine | Admitting: Emergency Medicine

## 2020-02-23 ENCOUNTER — Ambulatory Visit: Payer: Medicare HMO | Admitting: Family Medicine

## 2020-02-23 ENCOUNTER — Telehealth: Payer: Self-pay

## 2020-02-23 ENCOUNTER — Other Ambulatory Visit: Payer: Self-pay

## 2020-02-23 DIAGNOSIS — M549 Dorsalgia, unspecified: Secondary | ICD-10-CM | POA: Diagnosis not present

## 2020-02-23 DIAGNOSIS — J069 Acute upper respiratory infection, unspecified: Secondary | ICD-10-CM

## 2020-02-23 MED ORDER — BENZONATATE 100 MG PO CAPS: 100.0000 mg | ORAL_CAPSULE | Freq: Three times a day (TID) | ORAL | 0 refills | Status: DC

## 2020-02-23 MED ORDER — PREDNISONE 20 MG PO TABS: 20.0000 mg | ORAL_TABLET | Freq: Every day | ORAL | 0 refills | Status: DC

## 2020-02-23 MED ORDER — ALBUTEROL SULFATE HFA 108 (90 BASE) MCG/ACT IN AERS: 2.0000 | INHALATION_SPRAY | Freq: Four times a day (QID) | RESPIRATORY_TRACT | 2 refills | Status: DC | PRN

## 2020-02-23 MED ORDER — AEROCHAMBER PLUS FLO-VU MEDIUM MISC: 1.0000 | Freq: Once | 0 refills | Status: AC

## 2020-02-23 NOTE — Discharge Instructions (Signed)

## 2020-02-23 NOTE — ED Triage Notes (Signed)
Pt presents with chest & nasal congestion and left side flank pain with no urinary symptoms X 1 week.

## 2020-02-23 NOTE — Telephone Encounter (Signed)
I spoke with pt; and she confirmed symptoms listed from access nurse. Pt was given an in office appt with cough and SOB. I advised pt due to symptoms she could not come into office and she has had no injury or fall and pt thinks she may have pulled a muscle in the lt side but she does not remember an event that would have caused a pull muscle and the lt side pain goes from under her lt breast down to waistline or just below. I advised pt she probably did need face to face visit to have lt side pain checked out and pt will go to [facility] now for eval of lt side pain and respiratory issues. Cancelled in office appt with Dr Lorelei Pont and sending note to Dr Darnell Level as PCP.

## 2020-02-23 NOTE — ED Provider Notes (Signed)
[name]    CSN: [id number] Arrival date & time: 02/23/20  1032      History   Chief Complaint Chief Complaint  Patient presents with  . Nasal Congestion  . Flank Pain    HPI Alexandria Leach is a 69 y.o. female  With history as below presenting for Covid testing. Endorsing nonproductive cough and nasal congestion x1 week. Patient is also noted left side pain that goes from under breasts to around her back. Denies urinary symptoms, injury. Has not taken any for this. Thinks that she may have pulled a muscle. Denies history of shingles.  Past Medical History:  Diagnosis Date  . Ankle fracture, left 02/19/2015   from a fall  . Anxiety   . Cataract 2014   corrected with surgery  . CHF (congestive heart failure) ([place])   . CKD (chronic kidney disease) stage 3, GFR 30-59 ml/min [facility])    saw nephrologist Dr Harden Mo  . DDD (degenerative disc disease), cervical   . Depression   . Fibromyalgia   . Glaucoma    s/p surgery, sees ophtho Q6 mo  . 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 History of kidney stones 2010  . History of pulmonary embolism 2001, 2006   completed coumadin courses  . History of rheumatic fever x3  . HLD (hyperlipidemia)   . HTN (hypertension)   . Insomnia   . Lung nodule 09/22/2012   RLL - 46mm, stable since 2014. Thought benign.   . Osteoarthritis    shoulders and knees, not RA per Dr Estanislado Pandy, positive ANA, positive Ro  . Osteopenia 09/18/2015   DEXA T -1.1 hip, -0.2 spine 08/2015   . Personal history of urinary calculi latest 2014  . Pneumonia 12/02/2011  . PONV (postoperative nausea and vomiting)   . Refusal of blood transfusions as patient is Jehovah's Witness   . Rheumatic heart disease 1980   s/p mitral valve repair 1980  . Sjogren's syndrome ([place])   . Trimalleolar fracture of left ankle 02/23/2015    Patient Active Problem List   Diagnosis Date Noted  .  Secondary hyperparathyroidism of renal origin ([place]) 12/28/2019  . Constipation 12/28/2019  . Sinus tachycardia 05/27/2018  . Transient alteration of awareness 04/04/2018  . Abnormal serum thyroid stimulating hormone (TSH) level 01/30/2018  . 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[place]) 12/22/2017  . Thyroid nodule 09/16/2017  . Neck pain on right side 05/14/2017  . Sialoadenitis of submandibular gland 10/29/2016  . High risk medication use 06/05/2016  . Peripheral neuropathy 01/30/2016  . DNR no code (do not resuscitate) 12/08/2015  . 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 TIA (transient ischemic attack) 10/12/2015  . Mild mitral regurgitation 10/12/2015  . Osteopenia 09/18/2015  . Right arm pain 08/29/2015  . History of fall   . Right sided sciatica 02/21/2015  . Imbalance 01/12/2015  . Transaminitis 12/24/2014  . DDD (degenerative disc disease), cervical   . Health maintenance examination 10/18/2014  . Advanced care planning/counseling discussion 10/18/2014  . Medicare annual wellness visit, subsequent 10/18/2014  . Refusal of blood transfusions as patient is Jehovah's Witness   . Sjogren's syndrome ([place])   . CKD (chronic kidney disease) stage 3, GFR 30-59 ml/min ([facility])   . Glaucoma   . Fibromyalgia   . Osteoarthritis   . History of pulmonary embolism   . Lung nodule 09/22/2012  . Diastolic CHF ([place]) 11/94/1740  . Aortic stenosis  04/22/2011  . Palpitations 08/10/2010  . HOT FLASHES 06/28/2008  . ARTHRALGIA 07/01/2007  . BACK PAIN, LEFT 08/25/2006  . GAD (generalized anxiety disorder) 03/28/2006  . History of mitral valve repair 03/28/2006  . History of total abdominal hysterectomy 03/28/2006  . HYPERCHOLESTEROLEMIA 12/31/2005  . MDD (major depressive disorder), recurrent episode ([place]) 12/31/2005  . RHEUMATIC HEART DISEASE 12/31/2005  . Essential hypertension 12/31/2005  . Chronic insomnia 12/31/2005    Past Surgical History:  Procedure Laterality Date  .  BREAST BIOPSY Right 2006   benign  . CHOLECYSTECTOMY  11/27/2011   Procedure: LAPAROSCOPIC CHOLECYSTECTOMY WITH INTRAOPERATIVE CHOLANGIOGRAM;  Surgeon: Adin Hector, MD;  Location: [facility];  Service: General;  Laterality: N/A;  laparoscopic cholecystectomy with choleangiogram umbilical hernia repair  . COLONOSCOPY  07/2014   WNL [facility])  . COLONOSCOPY  08/2019   3 TAs, rpt 3 yrs ([place])  . dexa  08/2012   normal per patient - no records available  . ESOPHAGOGASTRODUODENOSCOPY  08/2019   reactive gastropathy, neg H pylori ([place])  . EYE SURGERY Bilateral 2014   cataract removal  . MITRAL VALVE REPAIR  1980   open heart  . ORIF ANKLE FRACTURE Left 02/26/2015   Procedure: OPEN REDUCTION INTERNAL FIXATION (ORIF) LEFT TRIMALLEOLAR ANKLE FRACTURE;  Surgeon: [name], MD;  Location: [facility];  Service: Orthopedics;  Laterality: Left;  . TUBAL LIGATION  1980  . UMBILICAL HERNIA REPAIR  11/27/2011   Procedure: HERNIA REPAIR UMBILICAL ADULT;  Surgeon: Adin Hector, MD;  Location: [facility];  Service: General;  Laterality: N/A;  . [place]   for fibroids -- partial, ovaries remain    OB History   No obstetric history on file.      Home Medications    Prior to Admission medications   Medication Sig Start Date End Date Taking? Authorizing Provider  albuterol (VENTOLIN HFA) 108 (90 Base) MCG/ACT inhaler Inhale 2 puffs into the lungs every 6 (six) hours as needed for wheezing or shortness of breath. 02/23/20   Hall-Potvin, [place], PA-C  ALPRAZolam (XANAX) 0.5 MG tablet Take 1 tablet (0.5 mg total) by mouth 2 (two) times daily. 11/25/19 11/24/20  Cloria Spring, MD  amitriptyline (ELAVIL) 25 MG tablet Take 1 tablet (25 mg total) by mouth at bedtime. 11/25/19   Cloria Spring, MD  antiseptic oral rinse (BIOTENE) LIQD 15 mLs by Mouth Rinse route at bedtime.     [provider]  aspirin EC 81 MG tablet Take 1 tablet (81 mg total) by mouth daily. 10/29/19    Lelon Perla, MD  benzonatate (TESSALON) 100 MG capsule Take 1 capsule (100 mg total) by mouth every 8 (eight) hours. 02/23/20   Hall-Potvin, [place], PA-C  cholecalciferol (VITAMIN D) 1000 UNITS tablet Take 1,000 Units by mouth daily.     [provider]  Co-Enzyme Q-10 100 MG CAPS Take 1 capsule (100 mg total) by mouth daily. 12/15/17   Ria Bush, MD  diclofenac sodium (VOLTAREN) 1 % GEL Apply 1 application topically 2 (two) times daily as needed (pain).    [provider]  diltiazem (CARDIZEM CD) 120 MG 24 hr capsule Take 1 capsule (120 mg total) by mouth daily. 11/15/19 02/14/20  Lelon Perla, MD  diltiazem (CARDIZEM CD) 360 MG 24 hr capsule Take 1 capsule (360 mg total) by mouth daily. 10/29/19   Lelon Perla, MD  docusate sodium (COLACE) 100 MG capsule Take 1 capsule (100 mg total) by  mouth daily. 12/28/19   Ria Bush, MD  escitalopram (LEXAPRO) 20 MG tablet Take 1 tablet (20 mg total) by mouth 2 (two) times daily. 11/25/19   Cloria Spring, MD  ezetimibe (ZETIA) 10 MG tablet TAKE 1 TABLET BY MOUTH AT  BEDTIME 03/01/19   Ria Bush, MD  famotidine (PEPCID) 20 MG tablet TAKE 1 TABLET BY MOUTH TWO  TIMES DAILY 01/22/19   Ria Bush, MD  furosemide (LASIX) 20 MG tablet TAKE 1 TABLET BY MOUTH  DAILY AS NEEDED FOR EDEMA 09/07/19   Ria Bush, MD  gabapentin (NEURONTIN) 300 MG capsule TAKE 2 CAPSULES BY MOUTH  EVERY MORNING, 1 CAPSULE  EVERY AFTERNOON AND 2  CAPSULES AT BEDTIME. 03/09/19   Ria Bush, MD  lovastatin (MEVACOR) 10 MG tablet TAKE 1 TABLET BY MOUTH  EVERY MONDAY, [place],  AND FRIDAY 04/29/19   Ria Bush, MD  methocarbamol (ROBAXIN) 500 MG tablet Take 1 tablet (500 mg total) by mouth 2 (two) times daily as needed for muscle spasms. 01/05/20   Bo Merino, MD  metoprolol tartrate (LOPRESSOR) 50 MG tablet Take 1.5 tablets (75 mg total) by mouth 2 (two) times daily. 06/19/19 02/14/20  Ria Bush, MD   Polyethyl Glycol-Propyl Glycol (SYSTANE) 0.4-0.3 % GEL Place 1 drop into both eyes 2 (two) times daily.     [provider]  polyethylene glycol powder (GLYCOLAX/MIRALAX) 17 GM/SCOOP powder Take 8.5-17 g by mouth daily as needed for moderate constipation. (1/2-1 capful daily as needed) 12/28/19   Ria Bush, MD  predniSONE (DELTASONE) 20 MG tablet Take 1 tablet (20 mg total) by mouth daily. 02/23/20   Hall-Potvin, [place], PA-C  RESTASIS 0.05 % ophthalmic emulsion INSTILL ONE DROP IN BOTH  EYES TWO TIMES DAILY 12/02/16   Ria Bush, MD  Spacer/Aero-Holding Chambers (AEROCHAMBER PLUS FLO-VU MEDIUM) MISC 1 each by Other route once for 1 dose. 02/23/20 02/23/20  Hall-Potvin, [place], PA-C  traMADol (ULTRAM) 50 MG tablet Take 1 tablet (50 mg total) by mouth 2 (two) times daily as needed. 01/05/20   Bo Merino, MD    Family History Family History  Problem Relation Age of Onset  . Lupus Sister        and niece  . Cancer Mother        lung (nonsmoker)  . CAD Mother        MI in her 30s  . ALS Mother   . Kidney disease Father   . Alcohol abuse Father   . Diabetes Father   . Cancer Brother        bone  . Diabetes Sister   . Stroke Sister   . Cancer Maternal Uncle        bone  . Depression Sister   . Lupus Sister   . Kidney failure Other        on HD  . Diabetes Brother   . Heart attack Brother   . Stroke Maternal Grandmother   . Blindness Sister   . Healthy Son   . Healthy Son   . Healthy Son   . Healthy Son     Social History Social History   Tobacco Use  . Smoking status: Never Smoker  . Smokeless tobacco: Never Used  Vaping Use  . Vaping Use: Never used  Substance Use Topics  . Alcohol use: No    Alcohol/week: 0.0 standard drinks  . Drug use: No     Allergies   Cymbalta [duloxetine hcl], Statins, and Sulfa antibiotics  Review of Systems Review of Systems  Constitutional: Negative for fatigue and fever.  HENT: Positive for  congestion. Negative for dental problem, ear pain, facial swelling, hearing loss, sinus pain, sore throat, trouble swallowing and voice change.   Eyes: Negative for photophobia, pain and visual disturbance.  Respiratory: Positive for cough. Negative for shortness of breath and wheezing.   Cardiovascular: Negative for chest pain and palpitations.  Gastrointestinal: Negative for diarrhea and vomiting.  Musculoskeletal: Negative for arthralgias and myalgias.  Neurological: Negative for dizziness and headaches.     Physical Exam Triage Vital Signs ED Triage Vitals  Enc Vitals Group     BP 02/23/20 1156 (!) 175/80     Pulse Rate 02/23/20 1155 85     Resp 02/23/20 1155 17     Temp 02/23/20 1155 97.9 F (36.6 C)     Temp Source 02/23/20 1155 Oral     SpO2 02/23/20 1155 100 %     Weight --      Height --      Head Circumference --      Peak Flow --      Pain Score 02/23/20 1154 6     Pain Loc --      Pain Edu? --      Excl. in [place]? --    No data found.  Updated Vital Signs BP (!) 175/80   Pulse 85   Temp 97.9 F (36.6 C) (Oral)   Resp 17   SpO2 100%   Visual Acuity Right Eye Distance:   Left Eye Distance:   Bilateral Distance:    Right Eye Near:   Left Eye Near:    Bilateral Near:     Physical Exam Constitutional:      General: She is not in acute distress.    Appearance: She is not ill-appearing or diaphoretic.  HENT:     Head: Normocephalic and atraumatic.     Right Ear: Tympanic membrane and ear canal normal.     Left Ear: Tympanic membrane and ear canal normal.     Mouth/Throat:     Mouth: Mucous membranes are moist.     Pharynx: Oropharynx is clear. No oropharyngeal exudate or posterior oropharyngeal erythema.  Eyes:     General: No scleral icterus.    Conjunctiva/sclera: Conjunctivae normal.     Pupils: Pupils are equal, round, and reactive to light.  Neck:     Comments: Trachea midline, negative JVD Cardiovascular:     Rate and Rhythm: Normal rate and  regular rhythm.     Heart sounds: No murmur heard.  No gallop.   Pulmonary:     Effort: Pulmonary effort is normal. No respiratory distress.     Breath sounds: No wheezing, rhonchi or rales.  Musculoskeletal:        General: Tenderness present. No swelling.     Cervical back: Neck supple. No tenderness.     Comments: Mild TTP of left side/chest that goes from under left breast around to back.  Lymphadenopathy:     Cervical: No cervical adenopathy.  Skin:    Capillary Refill: Capillary refill takes less than 2 seconds.     Coloration: Skin is not jaundiced or pale.  Neurological:     General: No focal deficit present.     Mental Status: She is alert and oriented to person, place, and time.      UC Treatments / Results  Labs (all labs ordered are listed, but only abnormal results are displayed)  Labs Reviewed - No data to display  EKG   Radiology No results found.  Procedures Procedures (including critical care time)  Medications Ordered in UC Medications - No data to display  Initial Impression / Assessment and Plan / UC Course  I have reviewed the triage vital signs and the nursing notes.  Pertinent labs & imaging results that were available during my care of the patient were reviewed by me and considered in my medical decision making (see chart for details).     Patient afebrile, nontoxic, with SpO2 100%.  Covid PCR pending.  Patient to quarantine until results are back.  We will treat supportively as outlined below.  Thoracic back/side pain likely MSK, though discussed return precautions of shingles given distribution. Return precautions discussed, patient verbalized understanding and is agreeable to plan. Final Clinical Impressions(s) / UC Diagnoses   Final diagnoses:  Acute left-sided back pain, unspecified back location  URI with cough and congestion     Discharge Instructions     Tessalon for cough. Start flonase, atrovent nasal spray for nasal  congestion/drainage. You can use over the counter nasal saline rinse such as neti pot for nasal congestion. Keep hydrated, your urine should be clear to pale yellow in color. Tylenol/motrin for fever and pain. Monitor for any worsening of symptoms, chest pain, shortness of breath, wheezing, swelling of the throat, go to the emergency department for further evaluation needed.     ED Prescriptions    Medication Sig Dispense Auth. Provider   benzonatate (TESSALON) 100 MG capsule Take 1 capsule (100 mg total) by mouth every 8 (eight) hours. 21 capsule Hall-Potvin, [place], PA-C   predniSONE (DELTASONE) 20 MG tablet Take 1 tablet (20 mg total) by mouth daily. 5 tablet Hall-Potvin, [place], PA-C   albuterol (VENTOLIN HFA) 108 (90 Base) MCG/ACT inhaler Inhale 2 puffs into the lungs every 6 (six) hours as needed for wheezing or shortness of breath. 8 g Hall-Potvin, [place], PA-C   Spacer/Aero-Holding Chambers (AEROCHAMBER PLUS FLO-VU MEDIUM) MISC 1 each by Other route once for 1 dose. 1 each Hall-Potvin, [place], PA-C     PDMP not reviewed this encounter.   Hall-Potvin, [place], [place] 02/23/20 1403

## 2020-02-23 NOTE — Telephone Encounter (Signed)
[facility] Night - Client TELEPHONE ADVICE RECORD [facility] Patient Name: Alexandria Leach Gender: Female DOB: 01/19/1951 Age: 69 Y 31 M 29 D Return Phone Number: [phone] (Primary), [phone] (Secondary) Address: City/State/Zip: [place] [address] Client [facility] [place] Night - Client Client Site [place] Physician Ria Bush - MD Contact Type Call Who Is Calling Patient / Member / Family / Caregiver Call Type Triage / Clinical Relationship To Patient Self Return Phone Number [phone] (Primary) Chief Complaint Flank Pain Reason for Call Request to Schedule Office Appointment Initial Comment Caller states she has chest congestion and left side is hurting. Translation No Nurse Assessment Nurse: Zenia Resides, RN, Diane Date/Time Eilene Ghazi Time): 02/23/2020 8:12:17 AM Confirm and document reason for call. If symptomatic, describe symptoms. ---Caller states she has chest congestion and left side (under her breast down the side) is hurting. Symptoms are worse at night. Pain started on Monday (thinks she may have twisted or pulled something. No chest pain and very little sob. No fever. Pt. is coughing, but can't bring up the phlegm. Does the patient have any new or worsening symptoms? ---Yes Will a triage be completed? ---Yes Related visit to physician within the last 2 weeks? ---No Does the PT have any chronic conditions? (i.e. diabetes, asthma, this includes High risk factors for pregnancy, etc.) ---Yes List chronic conditions. ---HTN, Fibromyalgia and shogruns Is this a behavioral health or substance abuse call? ---No Guidelines Guideline Title Affirmed Question Affirmed Notes Nurse Date/Time Eilene Ghazi Time) Flank Pain [1] MILD pain (i.e., scale 1-3; does not interfere with normal activities) AND [2] present > 3 days Zenia Resides, RN, Diane 02/23/2020 8:15:59 AM Cough - Acute  Non- Productive Wheezing is present Zenia Resides, RN, Diane 02/23/2020 8:18:16 AM Disp. Time Eilene Ghazi Time) Disposition Final User 02/23/2020 8:17:43 AM SEE PCP WITHIN 3 DAYS Zenia Resides, RN, Diane PLEASE NOTE: All timestamps contained within this report are represented as [place] Standard Time. CONFIDENTIALTY NOTICE: This fax transmission is intended only for the addressee. It contains information that is legally privileged, confidential or otherwise protected from use or disclosure. If you are not the intended recipient, you are strictly prohibited from reviewing, disclosing, copying using or disseminating any of this information or taking any action in reliance on or regarding this information. If you have received this fax in error, please notify us immediately by telephone so that we can arrange for its return to [place]. Phone: [phone], Toll-Free: [phone], Fax: [phone] Page: 2 of 2 Call Id: [id number] 02/23/2020 8:20:16 AM See HCP within 4 Hours (or PCP triage) Yes Zenia Resides, RN, Diane Caller Disagree/Comply Comply Caller Understands Yes PreDisposition Call Doctor Care Advice Given Per Guideline SEE PCP WITHIN 3 DAYS: * You need to be seen within 2 or 3 days. PAIN MEDICINES: * For pain relief, you can take either acetaminophen, ibuprofen, or naproxen. CALL BACK IF: * Fever over 100.4 F (38.0 C) * Burning with urination or blood in urine CARE ADVICE given per Flank Pain (Adult) guideline. * You become worse SEE HCP (OR PCP TRIAGE) WITHIN 4 HOURS: * IF OFFICE WILL BE OPEN: You need to be seen within the next 3 or 4 hours. Call your doctor (or NP/PA) now or as soon as the office opens. * You become worse CALL BACK IF: CARE ADVICE given per Cough - Acute Non-Productive (Adult) guideline. Comments User: Hildred Priest, RN Date/Time Eilene Ghazi Time): 02/23/2020 8:23:03 AM Warm transferred caller to appt. line and told her to  tell them she had spoken with the nurse for a 4 hour outcome. Told her if there  were no appts. available, she did need to be seen in UC. Referrals REFERRED TO PCP OFFICE

## 2020-02-23 NOTE — Telephone Encounter (Signed)
Will await [facility] eval.  

## 2020-02-24 ENCOUNTER — Telehealth (INDEPENDENT_AMBULATORY_CARE_PROVIDER_SITE_OTHER): Payer: Medicare HMO | Admitting: Psychiatry

## 2020-02-24 ENCOUNTER — Encounter (HOSPITAL_COMMUNITY): Payer: Self-pay | Admitting: Psychiatry

## 2020-02-24 DIAGNOSIS — F331 Major depressive disorder, recurrent, moderate: Secondary | ICD-10-CM

## 2020-02-24 MED ORDER — ALPRAZOLAM 0.5 MG PO TABS: 0.5000 mg | ORAL_TABLET | Freq: Two times a day (BID) | ORAL | 2 refills | Status: DC

## 2020-02-24 MED ORDER — AMITRIPTYLINE HCL 25 MG PO TABS
25.0000 mg | ORAL_TABLET | Freq: Every day | ORAL | 2 refills | Status: DC
Start: 2020-02-24 — End: 2020-05-24

## 2020-02-24 MED ORDER — ESCITALOPRAM OXALATE 20 MG PO TABS
20.0000 mg | ORAL_TABLET | Freq: Two times a day (BID) | ORAL | 2 refills | Status: DC
Start: 2020-02-24 — End: 2020-05-24

## 2020-02-24 NOTE — Progress Notes (Signed)
Virtual Visit via Telephone Note  I connected with Alexandria Leach on 02/24/20 at  1:00 PM EST by telephone and verified that I am speaking with the correct person using two identifiers.  Location: Patient: home Provider: home   I discussed the limitations, risks, security and privacy concerns of performing an evaluation and management service by telephone and the availability of in person appointments. I also discussed with the patient that there may be a patient responsible charge related to this service. The patient expressed understanding and agreed to proceed.    I discussed the assessment and treatment plan with the patient. The patient was provided an opportunity to ask questions and all were answered. The patient agreed with the plan and demonstrated an understanding of the instructions.   The patient was advised to call back or seek an in-person evaluation if the symptoms worsen or if the condition fails to improve as anticipated.  I provided 15 minutes of non-face-to-face time during this encounter.   Alexandria Spiller, MD  [facility] MD/PA/NP OP Progress Note  02/24/2020 1:16 PM Alexandria Leach  MRN:  [id number]  Chief Complaint:  Chief Complaint    Depression; Anxiety; Follow-up     HPI: This patient is a 69 year old separated black female who lives alone in [place].  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  She has 4 sons.  The patient returns for follow-up after 3 months.  She states that for the most part she is doing okay in terms of her mental status.  She is still having a lot of trouble with dry eyes due to the Sjogren's syndrome.  She is still on Plaquenil but the rheumatologist may change this next year.  She denies being seriously depressed or having anxiety.  She is sleeping well. Visit Diagnosis:    ICD-10-CM   1. Major depressive disorder, recurrent episode, moderate (HCC)  F33.1      Past Psychiatric History: none  Past Medical History:  Past Medical History:  Diagnosis Date  . Ankle fracture, left 02/19/2015   from a fall  . Anxiety   . Cataract 2014   corrected with surgery  . CHF (congestive heart failure) ([place])   . CKD (chronic kidney disease) stage 3, GFR 30-59 ml/min [facility])    saw nephrologist Dr Harden Mo  . DDD (degenerative disc disease), cervical   . Depression   . Fibromyalgia   . Glaucoma    s/p surgery, sees ophtho Q6 mo  . 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 History of kidney stones 2010  . History of pulmonary embolism 2001, 2006   completed coumadin courses  . History of rheumatic fever x3  . HLD (hyperlipidemia)   . HTN (hypertension)   . Insomnia   . Lung nodule 09/22/2012   RLL - 2mm, stable since 2014. Thought benign.   . Osteoarthritis    shoulders and knees, not RA per Dr Estanislado Pandy, positive ANA, positive Ro  . Osteopenia 09/18/2015   DEXA T -1.1 hip, -0.2 spine 08/2015   . Personal history of urinary calculi latest 2014  . Pneumonia 12/02/2011  . PONV (postoperative nausea and vomiting)   . Refusal of blood transfusions as patient is Jehovah's Witness   . Rheumatic heart disease 1980   s/p mitral valve repair 1980  . Sjogren's syndrome ([place])   . Trimalleolar fracture of  left ankle 02/23/2015    Past Surgical History:  Procedure Laterality Date  . BREAST BIOPSY Right 2006   benign  . CHOLECYSTECTOMY  11/27/2011   Procedure: LAPAROSCOPIC CHOLECYSTECTOMY WITH INTRAOPERATIVE CHOLANGIOGRAM;  Surgeon: Adin Hector, MD;  Location: [place];  Service: General;  Laterality: N/A;  laparoscopic cholecystectomy with choleangiogram umbilical hernia repair  . COLONOSCOPY  07/2014   WNL Amedeo [facility]enty)  . COLONOSCOPY  08/2019   3 TAs, rpt 3 yrs ([place])  . dexa  08/2012   normal per patient - no records available  . ESOPHAGOGASTRODUODENOSCOPY  08/2019   reactive gastropathy,  neg H pylori ([place])  . EYE SURGERY Bilateral 2014   cataract removal  . MITRAL VALVE REPAIR  1980   open heart  . ORIF ANKLE FRACTURE Left 02/26/2015   Procedure: OPEN REDUCTION INTERNAL FIXATION (ORIF) LEFT TRIMALLEOLAR ANKLE FRACTURE;  Surgeon: [name], MD;  Location: [place];  Service: Orthopedics;  Laterality: Left;  . TUBAL LIGATION  1980  . UMBILICAL HERNIA REPAIR  11/27/2011   Procedure: HERNIA REPAIR UMBILICAL ADULT;  Surgeon: Adin Hector, MD;  Location: [place];  Service: General;  Laterality: N/A;  . [place]   for fibroids -- partial, ovaries remain    Family Psychiatric History: see below  Family History:  Family History  Problem Relation Age of Onset  . Lupus Sister        and niece  . Cancer Mother        lung (nonsmoker)  . CAD Mother        MI in her 95s  . ALS Mother   . Kidney disease Father   . Alcohol abuse Father   . Diabetes Father   . Cancer Brother        bone  . Diabetes Sister   . Stroke Sister   . Cancer Maternal Uncle        bone  . Depression Sister   . Lupus Sister   . Kidney failure Other        on HD  . Diabetes Brother   . Heart attack Brother   . Stroke Maternal Grandmother   . Blindness Sister   . Healthy Son   . Healthy Son   . Healthy Son   . Healthy Son     Social History:  Social History   Socioeconomic History  . Marital status: Married    Spouse name: [name]  . Number of children: 4  . Years of education: 61  . Highest education level: Not on file  Occupational History    Employer: UNEMPLOYED    Comment: Disability  Tobacco Use  . Smoking status: Never Smoker  . Smokeless tobacco: Never Used  Vaping Use  . Vaping Use: Never used  Substance and Sexual Activity  . Alcohol use: No    Alcohol/week: 0.0 standard drinks  . Drug use: No  . Sexual activity: Not Currently    Birth control/protection: Surgical  Other Topics Concern  . Not on file  Social History Narrative   Lives  with son, 1 dog   Occupation: unemployed, on disability for fibromyalgia since 2008.   Edu: HS   Religion: Jehova's witness   Activity: volunteers at senior center   Diet: some water, fruits/vegetables daily   No caffeine use   Social Determinants of Health   Financial Resource Strain: Not on file  Food Insecurity: Not on file  Transportation Needs: Not on file  Physical  Activity: Not on file  Stress: Not on file  Social Connections: Not on file    Allergies:  Allergies  Allergen Reactions  . Cymbalta [Duloxetine Hcl] Other (See Comments)    tachycardia  . Statins Nausea Only and Other (See Comments)    Muscle cramps also  . Sulfa Antibiotics Nausea And Vomiting    Metabolic Disorder Labs: Lab Results  Component Value Date   HGBA1C 5.6 10/13/2015   MPG 114 10/13/2015   No results found for: PROLACTIN Lab Results  Component Value Date   CHOL 156 12/23/2019   TRIG 72.0 12/23/2019   HDL 56.10 12/23/2019   CHOLHDL 3 12/23/2019   VLDL 14.4 12/23/2019   LDLCALC 86 12/23/2019   LDLCALC 80 12/15/2018   Lab Results  Component Value Date   TSH 4.12 12/23/2019   TSH 1.91 10/06/2018    Therapeutic Level Labs: No results found for: LITHIUM No results found for: VALPROATE No components found for:  CBMZ  Current Medications: Current Outpatient Medications  Medication Sig Dispense Refill  . albuterol (VENTOLIN HFA) 108 (90 Base) MCG/ACT inhaler Inhale 2 puffs into the lungs every 6 (six) hours as needed for wheezing or shortness of breath. 8 g 2  . ALPRAZolam (XANAX) 0.5 MG tablet Take 1 tablet (0.5 mg total) by mouth 2 (two) times daily. 180 tablet 2  . amitriptyline (ELAVIL) 25 MG tablet Take 1 tablet (25 mg total) by mouth at bedtime. 90 tablet 2  . antiseptic oral rinse (BIOTENE) LIQD 15 mLs by Mouth Rinse route at bedtime.     Marland Kitchen aspirin EC 81 MG tablet Take 1 tablet (81 mg total) by mouth daily. 30 tablet   . benzonatate (TESSALON) 100 MG capsule Take 1 capsule  (100 mg total) by mouth every 8 (eight) hours. 21 capsule 0  . cholecalciferol (VITAMIN D) 1000 UNITS tablet Take 1,000 Units by mouth daily.     Marland Kitchen Co-Enzyme Q-10 100 MG CAPS Take 1 capsule (100 mg total) by mouth daily.  0  . diclofenac sodium (VOLTAREN) 1 % GEL Apply 1 application topically 2 (two) times daily as needed (pain).    Marland Kitchen diltiazem (CARDIZEM CD) 120 MG 24 hr capsule Take 1 capsule (120 mg total) by mouth daily. 90 capsule 3  . diltiazem (CARDIZEM CD) 360 MG 24 hr capsule Take 1 capsule (360 mg total) by mouth daily. 90 capsule 3  . docusate sodium (COLACE) 100 MG capsule Take 1 capsule (100 mg total) by mouth daily.    Marland Kitchen escitalopram (LEXAPRO) 20 MG tablet Take 1 tablet (20 mg total) by mouth 2 (two) times daily. 180 tablet 2  . ezetimibe (ZETIA) 10 MG tablet TAKE 1 TABLET BY MOUTH AT  BEDTIME 90 tablet 3  . famotidine (PEPCID) 20 MG tablet TAKE 1 TABLET BY MOUTH TWO  TIMES DAILY 180 tablet 3  . furosemide (LASIX) 20 MG tablet TAKE 1 TABLET BY MOUTH  DAILY AS NEEDED FOR EDEMA 90 tablet 3  . gabapentin (NEURONTIN) 300 MG capsule TAKE 2 CAPSULES BY MOUTH  EVERY MORNING, 1 CAPSULE  EVERY AFTERNOON AND 2  CAPSULES AT BEDTIME. 450 capsule 3  . hydroxychloroquine (PLAQUENIL) 200 MG tablet 1 tablet with food or milk    . lovastatin (MEVACOR) 10 MG tablet TAKE 1 TABLET BY MOUTH  EVERY MONDAY, WEDNESDAY,  AND FRIDAY 39 tablet 3  . methocarbamol (ROBAXIN) 500 MG tablet Take 1 tablet (500 mg total) by mouth 2 (two) times daily as needed for muscle  spasms. 14 tablet 0  . metoprolol tartrate (LOPRESSOR) 50 MG tablet Take 1.5 tablets (75 mg total) by mouth 2 (two) times daily. 270 tablet 3  . Polyethyl Glycol-Propyl Glycol (SYSTANE) 0.4-0.3 % GEL Place 1 drop into both eyes 2 (two) times daily.     . polyethylene glycol powder (GLYCOLAX/MIRALAX) 17 GM/SCOOP powder Take 8.5-17 g by mouth daily as needed for moderate constipation. (1/2-1 capful daily as needed) 3350 g 1  . predniSONE (DELTASONE) 20 MG  tablet Take 1 tablet (20 mg total) by mouth daily. 5 tablet 0  . RESTASIS 0.05 % ophthalmic emulsion INSTILL ONE DROP IN BOTH  EYES TWO TIMES DAILY 180 each 0  . traMADol (ULTRAM) 50 MG tablet Take 1 tablet (50 mg total) by mouth 2 (two) times daily as needed. 60 tablet 0   No current facility-administered medications for this visit.     Musculoskeletal: Strength & Muscle Tone: within normal limits Gait & Station: normal Patient leans: N/A  Psychiatric Specialty Exam: Review of Systems  Constitutional: Positive for fatigue.  HENT: Positive for congestion.   Musculoskeletal: Positive for arthralgias.  All other systems reviewed and are negative.   There were no vitals taken for this visit.There is no height or weight on file to calculate BMI.  General Appearance: NA  Eye Contact:  NA  Speech:  Clear and Coherent  Volume:  Normal  Mood:  Euthymic  Affect:  NA  Thought Process:  Goal Directed  Orientation:  Full (Time, Place, and Person)  Thought Content: Rumination   Suicidal Thoughts:  No  Homicidal Thoughts:  No  Memory:  Immediate;   Good Recent;   Good Remote;   Good  Judgement:  Good  Insight:  Good  Psychomotor Activity:  Decreased  Concentration:  Concentration: Good and Attention Span: Good  Recall:  Good  Fund of Knowledge: Good  Language: Good  Akathisia:  No  Handed:  Right  AIMS (if indicated): not done  Assets:  Communication Skills Desire for Improvement Physical Health Resilience Social Support Talents/Skills  ADL's:  Intact  Cognition: WNL  Sleep:  Good   Screenings: GAD-7   Flowsheet Row Office Visit from 12/28/2019 in [place] at [place] Visit from 12/22/2018 in [place] at [facility]  Total GAD-7 Score 0 5    [place] from 12/11/2017 in [place] at [place] from 12/09/2016 in [place] at [place] from 11/30/2015  in [place] at [facility]  Total Score (max 30 points ) 20 19 18     PHQ2-9   [place] Visit from 12/28/2019 in [place] at [facility] Visit from 12/22/2018 in [place] at [place] from 12/21/2018 in [place] at [place] from 12/11/2017 in [place] at [place] from 12/09/2016 in [place] at [facility] Total Score 0 0 1 0 2  PHQ-9 Total Score 1 4 1  0 4       Assessment and Plan: This patient is a 69 year old female with a history of depression and anxiety.  Despite her chronic medical issues she continues to do well.  She will continue Lexapro 20 mg twice daily for depression, Xanax 0.5 mg twice daily as needed for anxiety and amitriptyline 25 mg at bedtime for sleep and chronic pain.  She will return to see me in 3 months   Neoma Laming  Harrington Challenger, MD 02/24/2020, 1:16 PM

## 2020-02-25 NOTE — Telephone Encounter (Signed)
Dx viral URI, treated with prednisone, tessalon, albuterol inhaler.  COVID test pending.

## 2020-03-14 ENCOUNTER — Other Ambulatory Visit: Payer: Self-pay | Admitting: Rheumatology

## 2020-03-14 NOTE — Telephone Encounter (Signed)
She is on amitriptyline which interacts with tramadol.  I have reduced the dose of tramadol to 1 tablet a day.  She should not take them together.  She may consider tapering off tramadol.

## 2020-03-14 NOTE — Telephone Encounter (Signed)
Advised patient She is on amitriptyline which interacts with tramadol.  Dr. Corliss Skains has reduced the dose of tramadol to 1 tablet a day.  She should not take them together.  She may consider tapering off tramadol. patient verbalized understanding.

## 2020-03-14 NOTE — Telephone Encounter (Signed)
Attempted to contact patient and left message on machine to advise patient to call the office.  

## 2020-03-20 ENCOUNTER — Ambulatory Visit: Payer: Medicare HMO

## 2020-03-20 ENCOUNTER — Other Ambulatory Visit: Payer: Self-pay

## 2020-03-20 DIAGNOSIS — E7800 Pure hypercholesterolemia, unspecified: Secondary | ICD-10-CM

## 2020-03-20 DIAGNOSIS — I10 Essential (primary) hypertension: Secondary | ICD-10-CM

## 2020-03-20 NOTE — Chronic Care Management (AMB) (Signed)
Chronic Care Management Pharmacy  Name: Alexandria Leach  MRN: [id number] DOB: [birth year]  Chief Complaint/ HPI  Alexandria Leach,  70 y.o., female presents for their Follow-Up CCM visit with the clinical pharmacist via telephone.  PCP : Ria Bush, MD  Their chronic conditions addressed today include: hypertension, heart failure, hyperlipidemia   Patient concerns: Denies medication concerns. Reports she is out of gabapentin, needs refill. Insurance changed back to [facility] from [place]. Would like her preferred pharmacy changed back to [facility] from [place]. Currently taking tessalon and albuterol for sinus infection. Completed prednisone course. Improving.  Denies any medication changes in past 6 months.   Office Visits:  12/28/19: PCP - HLD stable statin three days weekly and zetia. Constipation add MiraLAX. CKD stage 3, stay hydrated. HTN and CHF stable on current regimen.   Consults:  02/24/20: Behavioral Health  02/14/20: Rheumatology - Sjogren's Syndrome Off Plaquenil Feb 2021, Fibromyalgia on Robaxin 500 mg 1 PRN and tramadol, insomnia on amitriptyline, DEXA due next year   08/25/19: Behavioral health - She denies significant depression or anxiety and she is sleeping fairly well with the amitriptyline. Continue Lexapro 20 mg twice daily for depression and Xanax 0.5 mg twice daily as needed for anxiety.  She also has amitriptyline 25 mg at bedtime to use as needed for sleep.  She will return to see me in 3 months.   09/10/19: Rheum/Sjogren's - She has been using Restasis eyedrops, Systane eyedrops, Biotene rinse for symptomatic relief.  She has been using these products more frequently which has improved her symptoms. Discussed using a humidifier. RTC 5 months.  Allergies  Allergen Reactions  . Cymbalta [Duloxetine Hcl] Other (See Comments)    tachycardia  . Statins Nausea Only and Other (See Comments)    Muscle cramps also  . Sulfa Antibiotics Nausea And Vomiting    Current Outpatient Medications on File Prior to Visit  Medication Sig Dispense Refill  . albuterol (VENTOLIN HFA) 108 (90 Base) MCG/ACT inhaler Inhale 2 puffs into the lungs every 6 (six) hours as needed for wheezing or shortness of breath. 8 g 2  . ALPRAZolam (XANAX) 0.5 MG tablet Take 1 tablet (0.5 mg total) by mouth 2 (two) times daily. 180 tablet 2  . amitriptyline (ELAVIL) 25 MG tablet Take 1 tablet (25 mg total) by mouth at bedtime. 90 tablet 2  . antiseptic oral rinse (BIOTENE) LIQD 15 mLs by Mouth Rinse route at bedtime.     Marland Kitchen aspirin EC 81 MG tablet Take 1 tablet (81 mg total) by mouth daily. 30 tablet   . benzonatate (TESSALON) 100 MG capsule Take 1 capsule (100 mg total) by mouth every 8 (eight) hours. 21 capsule 0  . cholecalciferol (VITAMIN D) 1000 UNITS tablet Take 1,000 Units by mouth daily.     Marland Kitchen Co-Enzyme Q-10 100 MG CAPS Take 1 capsule (100 mg total) by mouth daily.  0  . diclofenac sodium (VOLTAREN) 1 % GEL Apply 1 application topically 2 (two) times daily as needed (pain).    Marland Kitchen diltiazem (CARDIZEM CD) 120 MG 24 hr capsule Take 1 capsule (120 mg total) by mouth daily. 90 capsule 3  . diltiazem (CARDIZEM CD) 360 MG 24 hr capsule Take 1 capsule (360 mg total) by mouth daily. 90 capsule 3  . docusate sodium (COLACE) 100 MG capsule Take 1 capsule (100 mg total) by mouth daily.    Marland Kitchen escitalopram (LEXAPRO) 20 MG tablet Take 1 tablet (20 mg total) by mouth 2 (two)  times daily. 180 tablet 2  . ezetimibe (ZETIA) 10 MG tablet TAKE 1 TABLET BY MOUTH AT  BEDTIME 90 tablet 3  . famotidine (PEPCID) 20 MG tablet TAKE 1 TABLET BY MOUTH TWO  TIMES DAILY 180 tablet 3  . furosemide (LASIX) 20 MG tablet TAKE 1 TABLET BY MOUTH  DAILY AS NEEDED FOR EDEMA 90 tablet 3  . gabapentin (NEURONTIN) 300 MG capsule TAKE 2 CAPSULES BY MOUTH  EVERY MORNING, 1 CAPSULE  EVERY AFTERNOON AND 2  CAPSULES AT BEDTIME. 450 capsule 3  . lovastatin (MEVACOR) 10 MG tablet TAKE 1 TABLET BY MOUTH  EVERY MONDAY,  WEDNESDAY,  AND FRIDAY 39 tablet 3  . methocarbamol (ROBAXIN) 500 MG tablet TAKE 1 TABLET (500 MG TOTAL) BY MOUTH 2 (TWO) TIMES DAILY AS NEEDED FOR MUSCLE SPASMS. 14 tablet 0  . metoprolol tartrate (LOPRESSOR) 50 MG tablet Take 1.5 tablets (75 mg total) by mouth 2 (two) times daily. 270 tablet 3  . Polyethyl Glycol-Propyl Glycol (SYSTANE) 0.4-0.3 % GEL Place 1 drop into both eyes 2 (two) times daily.     . polyethylene glycol powder (GLYCOLAX/MIRALAX) 17 GM/SCOOP powder Take 8.5-17 g by mouth daily as needed for moderate constipation. (1/2-1 capful daily as needed) 3350 g 1  . RESTASIS 0.05 % ophthalmic emulsion INSTILL ONE DROP IN BOTH  EYES TWO TIMES DAILY 180 each 0  . traMADol (ULTRAM) 50 MG tablet Take 1 tablet (50 mg total) by mouth daily as needed. 30 tablet 0   No current facility-administered medications on file prior to visit.   Current Diagnosis/Assessment: Goals    . Increase physical activity     Starting 12/11/2017, I will continue to walk at least 60 min day/4 days per week.     . Patient Stated     12/21/2018, Patient wants to improve her ability to walk better and increase her mobility.     . Pharmacy Care Plan     Current Barriers:  . Chronic Disease Management support, education, and care coordination needs related to: hypertension, heart failure, hyperlipidemia  Pharmacist Clinical Goal(s):  Marland Kitchen Hypertension: Maintain blood pressure within goal of less than 140/90 mmHg. Marland Kitchen Heart failure: Prevent fluid overload and shortness of breath . Hyperlipidemia: Improve LDL within goal of less than 70 . Remain up to date on vaccinations. Recommend 2-dose series of shingles vaccine (Shingrix) and the tetanus vaccine (Tdap) from your pharmacy.   Interventions: . Comprehensive medication review performed. . Reviewed home blood pressure monitoring and symptoms of heart failure   Patient Self Care Activities:  . Continue to check weight daily in the morning and limit sodium. Take  Lasix as needed, 2 tablets daily with weight gain of 3 pounds over night or 5 pounds in 1 week. Current weight reported is 193-194 lbs.  . Continue to monitor blood pressure daily and limit salt intake . Slowly increase exercise with goal of 30 minutes, 5 days per week . Incorporate a healthy diet high in vegetables, fruits and whole grains with low-fat dairy products, chicken, fish, legumes, non-tropical vegetable oils and nuts. Limit intake of sweets, sugar-sweetened beverages and red meats.  Please see past updates related to this goal by clicking on the "Past Updates" button in the selected goal       Hyperlipidemia   LDL goal < 70  Last lipids Lab Results  Component Value Date   CHOL 156 12/23/2019   HDL 56.10 12/23/2019   LDLCALC 86 12/23/2019   LDLDIRECT 77.0 12/09/2016  TRIG 72.0 12/23/2019   CHOLHDL 3 12/23/2019   Hepatic Function Latest Ref Rng & Units 02/14/2020 12/23/2019 11/11/2019  Total Protein 6.1 - 8.1 g/dL 7.5 7.2 7.4  Albumin 3.5 - 5.2 g/dL - 4.3 4.2  AST 0 - 37 U/L - 21 33  ALT 0 - 35 U/L - 12 17  Alk Phosphatase 39 - 117 U/L - 75 57  Total Bilirubin 0.2 - 1.2 mg/dL - 0.5 0.6  Bilirubin, Direct 0.0 - 0.3 mg/dL - - -     The 10-year ASCVD risk score Mikey Bussing DC Jr., et al., 2013) is: 33.8%   Values used to calculate the score:     Age: 67 years     Sex: Female     Is Non-Hispanic African American: Yes     Diabetic: Yes     Tobacco smoker: No     Systolic Blood Pressure: 818 mmHg     Is BP treated: Yes     HDL Cholesterol: 56.1 mg/dL     Total Cholesterol: 156 mg/dL   Patient has failed: statins (myalgia) Patient is currently uncontrolled on the following medications:  . Ezetimibe 10 mg - 1 tablet daily at bedtime . Lovastatin 10 mg - 1 tablet M,W,F  Update 03/20/20: No refill history per chart but patient has bottles at home and reports last filled from [facility]. She is on auto-refill. On max tolerated statin dose + Zetia.   Plan: Continue current  medications  Heart Failure   Type: Diastolic Last ejection fraction: 05/2017 (65-70%) NYHA Class: II (slight limitation of activity) AHA HF Stage: C (Heart disease and symptoms present)  Patient has failed these meds in past: none Patient is currently controlled on the following medications:   Furosemide 20 mg - 2 tablets daily PRN  Metoprolol tartrate 50 mg - 1.5 tablets (75 mg) BID  Update 03/20/20: Denies SOB or swelling, Weight is stable.   Plan: Continue current medications   Hypertension   CMP Latest Ref Rng & Units 02/14/2020 12/23/2019 11/11/2019  Glucose 70 - 99 mg/dL - 113(H) 80  BUN 6 - 23 mg/dL - 12 9  Creatinine 0.40 - 1.20 mg/dL - 1.28(H) 1.26(H)  Sodium 135 - 145 mEq/L - 139 140  Potassium 3.5 - 5.1 mEq/L - 4.2 4.0  Chloride 96 - 112 mEq/L - 107 108  CO2 19 - 32 mEq/L - 24 23  Calcium 8.4 - 10.5 mg/dL - 9.7 9.9  Total Protein 6.1 - 8.1 g/dL 7.5 7.2 7.4  Total Bilirubin 0.2 - 1.2 mg/dL - 0.5 0.6  Alkaline Phos 39 - 117 U/L - 75 57  AST 0 - 37 U/L - 21 33  ALT 0 - 35 U/L - 12 17   Office blood pressures are  BP Readings from Last 3 Encounters:  02/23/20 (!) 175/80  02/14/20 140/85  12/28/19 140/80   BP goal < 140/90 mmHg Patient has failed these meds in the past: denies any failed therapies  Patient is currently controlled on the following medications:   Diltiazem 360 mg 24 hr - 1 capsule daily (palpitations - AM)  Metoprolol tartrate 50 mg - 1.5 tablets (75 mg) BID   We discussed: confirmed adherence to above   Update 03/20/20: Reports BP has improved. Checking once or twice daily. Ranging around 140/70 or less. She has decreased her salt intake.   Plan: Continue current medications  Medication Management  Pharmacy/Benefits: [facility] - updated preferred pharmacy per pt request   Adherence:  refills pillbox every Saturday   Affordability: no concerns   CCM Follow Up:  6 months telephone visit  (09/19/2020 at 11 AM)  Alexandria Leach,  PharmD Clinical Pharmacist [facility] [phone]

## 2020-03-20 NOTE — Progress Notes (Signed)
I have collaborated with the care management provider regarding care management and care coordination activities outlined in this encounter and have reviewed this encounter including documentation in the note and care plan. I am certifying that I agree with the content of this note and encounter as supervising physician.  

## 2020-03-20 NOTE — Patient Instructions (Addendum)
Dear Oren Binet Cherne,  Below is a summary of the goals we discussed during our follow up appointment on March 20, 2020. Please contact me anytime with questions or concerns.   Visit Information  Goals Addressed            This Visit's Progress   . Pharmacy Care Plan       Current Barriers:  . Chronic Disease Management support, education, and care coordination needs related to: hypertension, heart failure, hyperlipidemia  Pharmacist Clinical Goal(s):  Marland Kitchen Hypertension: Maintain blood pressure within goal of less than 140/90 mmHg. Marland Kitchen Heart failure: Prevent fluid overload and shortness of breath . Hyperlipidemia: Improve LDL within goal of less than 70 . Remain up to date on vaccinations. Recommend 2-dose series of shingles vaccine (Shingrix) and the tetanus vaccine (Tdap) from your pharmacy.   Interventions: . Comprehensive medication review performed. . Reviewed home blood pressure monitoring and symptoms of heart failure   Patient Self Care Activities:  . Continue to check weight daily in the morning and limit sodium. Take Lasix as needed, 2 tablets daily with weight gain of 3 pounds over night or 5 pounds in 1 week. Current weight reported is 193-194 lbs.  . Continue to monitor blood pressure daily and limit salt intake . Slowly increase exercise with goal of 30 minutes, 5 days per week . Incorporate a healthy diet high in vegetables, fruits and whole grains with low-fat dairy products, chicken, fish, legumes, non-tropical vegetable oils and nuts. Limit intake of sweets, sugar-sweetened beverages and red meats.  Please see past updates related to this goal by clicking on the "Past Updates" button in the selected goal        The patient verbalized understanding of instructions, educational materials, and care plan provided today and declined offer to receive copy of patient instructions, educational materials, and care plan.   Telephone follow up appointment with pharmacy  team member scheduled for: 09/19/2020 at 11 AM (phone visit)  Phil Dopp, PharmD Clinical Pharmacist  Primary Care at [facility] [phone]

## 2020-03-23 ENCOUNTER — Other Ambulatory Visit: Payer: Self-pay | Admitting: Family Medicine

## 2020-03-23 MED ORDER — GABAPENTIN 300 MG PO CAPS
ORAL_CAPSULE | ORAL | 3 refills | Status: DC
Start: 2020-03-23 — End: 2021-01-05

## 2020-03-31 ENCOUNTER — Other Ambulatory Visit: Payer: Self-pay

## 2020-03-31 MED ORDER — EZETIMIBE 10 MG PO TABS
10.0000 mg | ORAL_TABLET | Freq: Every day | ORAL | 3 refills | Status: DC
Start: 2020-03-31 — End: 2021-02-05

## 2020-03-31 MED ORDER — LOVASTATIN 10 MG PO TABS
ORAL_TABLET | ORAL | 3 refills | Status: DC
Start: 2020-03-31 — End: 2021-02-05

## 2020-03-31 NOTE — Telephone Encounter (Signed)
E-scribed refills.  

## 2020-05-03 DIAGNOSIS — K219 Gastro-esophageal reflux disease without esophagitis: Secondary | ICD-10-CM | POA: Diagnosis not present

## 2020-05-03 DIAGNOSIS — R1310 Dysphagia, unspecified: Secondary | ICD-10-CM | POA: Diagnosis not present

## 2020-05-03 DIAGNOSIS — Z86010 Personal history of colonic polyps: Secondary | ICD-10-CM | POA: Diagnosis not present

## 2020-05-10 NOTE — Progress Notes (Signed)
HPI: FU mitral valve repair secondary to rheumatic heart disease. Patient is status post mitral valve repair in 1980. Operative report not available. Nuclear study 11/14 showed EF 66 and normal perfusion. Holter 11/14 showed sinus with pacs, pvcs and brief PAT.Carotid Dopplers October 2019 showed no significant obstruction. Echocardiogram August 2021 showed normal LV function, grade 2 diastolic dysfunction, severe left atrial enlargement, moderate mitral stenosis, moderate mitral regurgitation, mild aortic stenosis, mild aortic insufficiency and moderate pulmonary hypertension.  Since last seen,she has some dyspnea on exertion unchanged.  No orthopnea, PND, palpitations, syncope or chest pain.  She has had pedal edema and is now taking Lasix 40 mg daily.  Current Outpatient Medications  Medication Sig Dispense Refill  . ALPRAZolam (XANAX) 0.5 MG tablet Take 1 tablet (0.5 mg total) by mouth 2 (two) times daily. 180 tablet 2  . amitriptyline (ELAVIL) 25 MG tablet Take 1 tablet (25 mg total) by mouth at bedtime. 90 tablet 2  . antiseptic oral rinse (BIOTENE) LIQD 15 mLs by Mouth Rinse route at bedtime.    Marland Kitchen aspirin EC 81 MG tablet Take 1 tablet (81 mg total) by mouth daily. 30 tablet   . cholecalciferol (VITAMIN D) 1000 UNITS tablet Take 1,000 Units by mouth daily.    Marland Kitchen Co-Enzyme Q-10 100 MG CAPS Take 1 capsule (100 mg total) by mouth daily.  0  . diclofenac sodium (VOLTAREN) 1 % GEL Apply 1 application topically 2 (two) times daily as needed (pain).    Marland Kitchen diltiazem (CARDIZEM CD) 360 MG 24 hr capsule Take 1 capsule (360 mg total) by mouth daily. 90 capsule 3  . docusate sodium (COLACE) 100 MG capsule Take 1 capsule (100 mg total) by mouth daily.    Marland Kitchen escitalopram (LEXAPRO) 20 MG tablet Take 1 tablet (20 mg total) by mouth 2 (two) times daily. 180 tablet 2  . ezetimibe (ZETIA) 10 MG tablet Take 1 tablet (10 mg total) by mouth at bedtime. 90 tablet 3  . furosemide (LASIX) 20 MG tablet TAKE 1  TABLET BY MOUTH  DAILY AS NEEDED FOR EDEMA 90 tablet 3  . gabapentin (NEURONTIN) 300 MG capsule Take 2 capsules every morning, 1 capsule every afternoon and 2 capsules at bedtime 450 capsule 3  . lovastatin (MEVACOR) 10 MG tablet TAKE 1 TABLET BY MOUTH  EVERY MONDAY, WEDNESDAY,  AND FRIDAY 39 tablet 3  . methocarbamol (ROBAXIN) 500 MG tablet TAKE 1 TABLET (500 MG TOTAL) BY MOUTH 2 (TWO) TIMES DAILY AS NEEDED FOR MUSCLE SPASMS. 14 tablet 0  . Polyethyl Glycol-Propyl Glycol (SYSTANE) 0.4-0.3 % GEL ophthalmic gel Place 1 drop into both eyes 2 (two) times daily.     . polyethylene glycol powder (GLYCOLAX/MIRALAX) 17 GM/SCOOP powder Take 8.5-17 g by mouth daily as needed for moderate constipation. (1/2-1 capful daily as needed) 3350 g 1  . RESTASIS 0.05 % ophthalmic emulsion INSTILL ONE DROP IN BOTH  EYES TWO TIMES DAILY 180 each 0  . traMADol (ULTRAM) 50 MG tablet Take 1 tablet (50 mg total) by mouth daily as needed. 30 tablet 0  . diltiazem (CARDIZEM CD) 120 MG 24 hr capsule Take 1 capsule (120 mg total) by mouth daily. 90 capsule 3  . metoprolol tartrate (LOPRESSOR) 50 MG tablet Take 1.5 tablets (75 mg total) by mouth 2 (two) times daily. 270 tablet 3   No current facility-administered medications for this visit.     Past Medical History:  Diagnosis Date  . Ankle fracture, left 02/19/2015  from a fall  . Anxiety   . Cataract 2014   corrected with surgery  . CHF (congestive heart failure) ([place])   . CKD (chronic kidney disease) stage 3, GFR 30-59 ml/min [facility])    saw nephrologist Dr Harden Mo  . DDD (degenerative disc disease), cervical   . Depression   . Fibromyalgia   . Glaucoma    s/p surgery, sees ophtho Q6 mo  . 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 History of kidney stones 2010  . History of pulmonary embolism 2001, 2006   completed coumadin courses  . History of rheumatic fever x3  . HLD (hyperlipidemia)   . HTN  (hypertension)   . Insomnia   . Lung nodule 09/22/2012   RLL - 37mm, stable since 2014. Thought benign.   . Osteoarthritis    shoulders and knees, not RA per Dr Estanislado Pandy, positive ANA, positive Ro  . Osteopenia 09/18/2015   DEXA T -1.1 hip, -0.2 spine 08/2015   . Personal history of urinary calculi latest 2014  . Pneumonia 12/02/2011  . PONV (postoperative nausea and vomiting)   . Refusal of blood transfusions as patient is Jehovah's Witness   . Rheumatic heart disease 1980   s/p mitral valve repair 1980  . Sjogren's syndrome ([place])   . Trimalleolar fracture of left ankle 02/23/2015    Past Surgical History:  Procedure Laterality Date  . BREAST BIOPSY Right 2006   benign  . CHOLECYSTECTOMY  11/27/2011   Procedure: LAPAROSCOPIC CHOLECYSTECTOMY WITH INTRAOPERATIVE CHOLANGIOGRAM;  Surgeon: Adin Hector, MD;  Location: [place];  Service: General;  Laterality: N/A;  laparoscopic cholecystectomy with choleangiogram umbilical hernia repair  . COLONOSCOPY  07/2014   [facility])  . COLONOSCOPY  08/2019   3 TAs, rpt 3 yrs ([place])  . dexa  08/2012   normal per patient - no records available  . ESOPHAGOGASTRODUODENOSCOPY  08/2019   reactive gastropathy, neg H pylori ([place])  . EYE SURGERY Bilateral 2014   cataract removal  . MITRAL VALVE REPAIR  1980   open heart  . ORIF ANKLE FRACTURE Left 02/26/2015   Procedure: OPEN REDUCTION INTERNAL FIXATION (ORIF) LEFT TRIMALLEOLAR ANKLE FRACTURE;  Surgeon: [name], MD;  Location: [facility];  Service: Orthopedics;  Laterality: Left;  . TUBAL LIGATION  1980  . UMBILICAL HERNIA REPAIR  11/27/2011   Procedure: HERNIA REPAIR UMBILICAL ADULT;  Surgeon: Adin Hector, MD;  Location: [place];  Service: General;  Laterality: N/A;  . [place]   for fibroids -- partial, ovaries remain    Social History   Socioeconomic History  . Marital status: Married    Spouse name: [name]  . Number of children: 4  . Years of education: 57  .  Highest education level: Not on file  Occupational History    Employer: UNEMPLOYED    Comment: Disability  Tobacco Use  . Smoking status: Never Smoker  . Smokeless tobacco: Never Used  Vaping Use  . Vaping Use: Never used  Substance and Sexual Activity  . Alcohol use: No    Alcohol/week: 0.0 standard drinks  . Drug use: No  . Sexual activity: Not Currently    Birth control/protection: Surgical  Other Topics Concern  . Not on file  Social History Narrative   Lives with son, 1 dog   Occupation: unemployed, on disability for fibromyalgia since 2008.   Edu: HS   Religion: Jehova's  witness   Activity: volunteers at senior center   Diet: some water, fruits/vegetables daily   No caffeine use   Social Determinants of Health   Financial Resource Strain: Not on file  Food Insecurity: Not on file  Transportation Needs: Not on file  Physical Activity: Not on file  Stress: Not on file  Social Connections: Not on file  Intimate Partner Violence: Not on file    Family History  Problem Relation Age of Onset  . Lupus Sister        and niece  . Cancer Mother        lung (nonsmoker)  . CAD Mother        MI in her 88s  . ALS Mother   . Kidney disease Father   . Alcohol abuse Father   . Diabetes Father   . Cancer Brother        bone  . Diabetes Sister   . Stroke Sister   . Cancer Maternal Uncle        bone  . Depression Sister   . Lupus Sister   . Kidney failure Other        on HD  . Diabetes Brother   . Heart attack Brother   . Stroke Maternal Grandmother   . Blindness Sister   . Healthy Son   . Healthy Son   . Healthy Son   . Healthy Son     ROS: no fevers or chills, productive cough, hemoptysis, dysphasia, odynophagia, melena, hematochezia, dysuria, hematuria, rash, seizure activity, orthopnea, PND, pedal edema, claudication. Remaining systems are negative.  Physical Exam: Well-developed well-nourished in no acute distress.  Skin is warm and dry.  HEENT is  normal.  Neck is supple.  Chest is clear to auscultation with normal expansion.  Cardiovascular exam is irregular; 2/6 systolic murmur apex. Abdominal exam nontender or distended. No masses palpated. Extremities show no edema. neuro grossly intact  ECG-atrial flutter at a rate of 76, nonspecific ST changes.  Personally reviewed  A/P  1 previous mitral valve repair-continue SBE prophylaxis.  2 mitral stenosis/mitral regurgitation and aortic stenosis/aortic insufficiency-we will plan to repeat echocardiogram August 2022 or sooner if needed.  Patient will ultimately likely require AVR and MVR.  3 chronic diastolic congestive heart failure-patient has increased her Lasix for pedal edema.  She appears to be euvolemic today.  We will continue at present dose.  Check potassium and renal function in 1 week.  Needs fluid restriction and low-sodium diet.  4 hypertension-patient's blood pressure elevated.  Add losartan 50 mg daily.  Follow blood pressure and advance as needed.  5 hyperlipidemia-continue statin.  Check lipids and liver.  6 palpitations-symptoms are well controlled.  Continue Cardizem and beta-blocker.  7 snoring-we will arrange evaluation with pulmonary to rule out sleep apnea.  8 new onset atrial flutter-patient is noted to be in atrial flutter today.  We will add apixaban 5 mg twice daily.  Continue metoprolol and Cardizem for rate control.  I will see her back in 6 to 8 weeks.  If atrial flutter persists we will proceed with cardioversion.  I will not refer for ablation as I think given history of mitral valve disease she would be at risk for recurrent atrial fibrillation as well.  May need to consider mitral valve surgery sooner rather than later.  Kirk Ruths, MD

## 2020-05-15 ENCOUNTER — Encounter: Payer: Self-pay | Admitting: Family Medicine

## 2020-05-15 DIAGNOSIS — R1310 Dysphagia, unspecified: Secondary | ICD-10-CM | POA: Insufficient documentation

## 2020-05-22 ENCOUNTER — Encounter: Payer: Self-pay | Admitting: Cardiology

## 2020-05-22 ENCOUNTER — Ambulatory Visit (INDEPENDENT_AMBULATORY_CARE_PROVIDER_SITE_OTHER): Payer: Medicare Other | Admitting: Cardiology

## 2020-05-22 ENCOUNTER — Other Ambulatory Visit: Payer: Self-pay

## 2020-05-22 VITALS — BP 146/80 | HR 84 | Ht 61.0 in | Wt 206.0 lb

## 2020-05-22 DIAGNOSIS — R0683 Snoring: Secondary | ICD-10-CM

## 2020-05-22 DIAGNOSIS — I10 Essential (primary) hypertension: Secondary | ICD-10-CM

## 2020-05-22 DIAGNOSIS — I4892 Unspecified atrial flutter: Secondary | ICD-10-CM | POA: Diagnosis not present

## 2020-05-22 DIAGNOSIS — E7800 Pure hypercholesterolemia, unspecified: Secondary | ICD-10-CM

## 2020-05-22 MED ORDER — LOSARTAN POTASSIUM 50 MG PO TABS: 50.0000 mg | ORAL_TABLET | Freq: Every day | ORAL | 3 refills | Status: DC

## 2020-05-22 MED ORDER — APIXABAN 5 MG PO TABS: 5.0000 mg | ORAL_TABLET | Freq: Two times a day (BID) | ORAL | 6 refills | Status: DC

## 2020-05-22 NOTE — Patient Instructions (Addendum)
Medication Instructions:   START LOSARTAN 50 MG ONCE DAILY  STOP ASPIRIN  START ELIQUIS 5 MG ONE TABLET TWICE DAILY   *If you need a refill on your cardiac medications before your next appointment, please call your pharmacy*   Lab Work:  Your physician recommends that you return for lab work in: ONE WEEK=FASTING  If you have labs (blood work) drawn today and your tests are completely normal, you will receive your results only by: Marland Kitchen MyChart Message (if you have MyChart) OR . A paper copy in the mail If you have any lab test that is abnormal or we need to change your treatment, we will call you to review the results.   Follow-Up: At [facility], you and your health needs are our priority.  As part of our continuing mission to provide you with exceptional heart care, we have created designated Provider Care Teams.  These Care Teams include your primary Cardiologist (physician) and Advanced Practice Providers (APPs -  Physician Assistants and Nurse Practitioners) who all work together to provide you with the care you need, when you need it.  We recommend signing up for the patient portal called "MyChart".  Sign up information is provided on this After Visit Summary.  MyChart is used to connect with patients for Virtual Visits (Telemedicine).  Patients are able to view lab/test results, encounter notes, upcoming appointments, etc.  Non-urgent messages can be sent to your provider as well.   To learn more about what you can do with MyChart, go to [email or web address].    Your next appointment:   6-8 week(s)  The format for your next appointment:   In Person  Provider:   Kirk Ruths, MD

## 2020-05-24 ENCOUNTER — Other Ambulatory Visit: Payer: Self-pay

## 2020-05-24 ENCOUNTER — Encounter (HOSPITAL_COMMUNITY): Payer: Self-pay | Admitting: Psychiatry

## 2020-05-24 ENCOUNTER — Telehealth (INDEPENDENT_AMBULATORY_CARE_PROVIDER_SITE_OTHER): Payer: Medicare Other | Admitting: Psychiatry

## 2020-05-24 DIAGNOSIS — F331 Major depressive disorder, recurrent, moderate: Secondary | ICD-10-CM

## 2020-05-24 MED ORDER — ESCITALOPRAM OXALATE 20 MG PO TABS: 20.0000 mg | ORAL_TABLET | Freq: Two times a day (BID) | ORAL | 2 refills | Status: DC

## 2020-05-24 MED ORDER — AMITRIPTYLINE HCL 25 MG PO TABS: 25.0000 mg | ORAL_TABLET | Freq: Every day | ORAL | 2 refills | Status: DC

## 2020-05-24 MED ORDER — ALPRAZOLAM 0.5 MG PO TABS: 0.5000 mg | ORAL_TABLET | Freq: Two times a day (BID) | ORAL | 2 refills | Status: DC

## 2020-05-24 NOTE — Progress Notes (Signed)
Virtual Visit via Video Note  I connected with Alexandria Leach on 05/24/20 at 10:00 AM EST by a video enabled telemedicine application and verified that I am speaking with the correct person using two identifiers.  Location: Patient: home Provider: home   I discussed the limitations of evaluation and management by telemedicine and the availability of in person appointments. The patient expressed understanding and agreed to proceed.    I discussed the assessment and treatment plan with the patient. The patient was provided an opportunity to ask questions and all were answered. The patient agreed with the plan and demonstrated an understanding of the instructions.   The patient was advised to call back or seek an in-person evaluation if the symptoms worsen or if the condition fails to improve as anticipated.  I provided 15 minutes of non-face-to-face time during this encounter.   [name], MD  [facility] MD/PA/NP OP Progress Note  05/24/2020 10:32 AM Alexandria Leach  MRN:  [id number]  Chief Complaint:  Chief Complaint    Depression; Anxiety; Follow-up     HPI: This patient is a 70 year old separated black female who lives alone in [place]. 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 She has 4 sons.  Returns for follow-up after 3 months.  She is having some health problems.  She has developed atrial flutter and is going to have to have cardioversion next week.  She still is dealing with problems with from Sjogren's syndrome.  Nonetheless she states her mood has been good and she has been enjoying time with her family.  She does not get out much because of the coronavirus but she states that her spirits are good her energy is fairly good and she is sleeping well.  She thinks that her medications are still helpful Visit Diagnosis:    ICD-10-CM   1. Major depressive disorder, recurrent episode, moderate (HCC)   F33.1     Past Psychiatric History: none  Past Medical History:  Past Medical History:  Diagnosis Date  . Ankle fracture, left 02/19/2015   from a fall  . Anxiety   . Cataract 2014   corrected with surgery  . CHF (congestive heart failure) ([place])   . CKD (chronic kidney disease) stage 3, GFR 30-59 ml/min [facility])    saw nephrologist Dr Harden Mo  . DDD (degenerative disc disease), cervical   . Depression   . Fibromyalgia   . Glaucoma    s/p surgery, sees ophtho Q6 mo  . 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 History of kidney stones 2010  . History of pulmonary embolism 2001, 2006   completed coumadin courses  . History of rheumatic fever x3  . HLD (hyperlipidemia)   . HTN (hypertension)   . Insomnia   . Lung nodule 09/22/2012   RLL - 31mm, stable since 2014. Thought benign.   . Osteoarthritis    shoulders and knees, not RA per Dr Estanislado Pandy, positive ANA, positive Ro  . Osteopenia 09/18/2015   DEXA T -1.1 hip, -0.2 spine 08/2015   . Personal history of urinary calculi latest 2014  . Pneumonia 12/02/2011  . PONV (postoperative nausea and vomiting)   . Refusal of blood transfusions as patient is Jehovah's Witness   . Rheumatic heart disease 1980   s/p mitral valve repair 1980  . Sjogren's syndrome ([place])   . Trimalleolar fracture of  left ankle 02/23/2015    Past Surgical History:  Procedure Laterality Date  . BREAST BIOPSY Right 2006   benign  . CHOLECYSTECTOMY  11/27/2011   Procedure: LAPAROSCOPIC CHOLECYSTECTOMY WITH INTRAOPERATIVE CHOLANGIOGRAM;  Surgeon: Adin Hector, MD;  Location: [facility];  Service: General;  Laterality: N/A;  laparoscopic cholecystectomy with choleangiogram umbilical hernia repair  . COLONOSCOPY  07/2014   WNL Amed[facility]o [facility])  . COLONOSCOPY  08/2019   3 TAs, rpt 3 yrs ([place])  . dexa  08/2012   normal per patient - no records available  . ESOPHAGOGASTRODUODENOSCOPY  08/2019   reactive  gastropathy, neg H pylori ([place])  . EYE SURGERY Bilateral 2014   cataract removal  . MITRAL VALVE REPAIR  1980   open heart  . ORIF ANKLE FRACTURE Left 02/26/2015   Procedure: OPEN REDUCTION INTERNAL FIXATION (ORIF) LEFT TRIMALLEOLAR ANKLE FRACTURE;  Surgeon: [name], MD;  Location: [facility];  Service: Orthopedics;  Laterality: Left;  . TUBAL LIGATION  1980  . UMBILICAL HERNIA REPAIR  11/27/2011   Procedure: HERNIA REPAIR UMBILICAL ADULT;  Surgeon: Adin Hector, MD;  Location: [facility];  Service: General;  Laterality: N/A;  . Protection   for fibroids -- partial, ovaries remain    Family Psychiatric History: see below  Family History:  Family History  Problem Relation Age of Onset  . Lupus Sister        and niece  . Cancer Mother        lung (nonsmoker)  . CAD Mother        MI in her 106s  . ALS Mother   . Kidney disease Father   . Alcohol abuse Father   . Diabetes Father   . Cancer Brother        bone  . Diabetes Sister   . Stroke Sister   . Cancer Maternal Uncle        bone  . Depression Sister   . Lupus Sister   . Kidney failure Other        on HD  . Diabetes Brother   . Heart attack Brother   . Stroke Maternal Grandmother   . Blindness Sister   . Healthy Son   . Healthy Son   . Healthy Son   . Healthy Son     Social History:  Social History   Socioeconomic History  . Marital status: Married    Spouse name: Alexandria Leach  . Number of children: 4  . Years of education: 12  . Highest education level: Not on file  Occupational History    Employer: UNEMPLOYED    Comment: Disability  Tobacco Use  . Smoking status: Never Smoker  . Smokeless tobacco: Never Used  Vaping Use  . Vaping Use: Never used  Substance and Sexual Activity  . Alcohol use: No    Alcohol/week: 0.0 standard drinks  . Drug use: No  . Sexual activity: Not Currently    Birth control/protection: Surgical  Other Topics Concern  . Not on file  Social History Narrative    Lives with son, 1 dog   Occupation: unemployed, on disability for fibromyalgia since 2008.   Edu: HS   Religion: Jehova's witness   Activity: volunteers at senior center   Diet: some water, fruits/vegetables daily   No caffeine use   Social Determinants of Health   Financial Resource Strain: Not on file  Food Insecurity: Not on file  Transportation Needs: Not on file  Physical  Activity: Not on file  Stress: Not on file  Social Connections: Not on file    Allergies:  Allergies  Allergen Reactions  . Cymbalta [Duloxetine Hcl] Other (See Comments)    tachycardia  . Statins Nausea Only and Other (See Comments)    Muscle cramps also  . Sulfa Antibiotics Nausea And Vomiting    Metabolic Disorder Labs: Lab Results  Component Value Date   HGBA1C 5.6 10/13/2015   MPG 114 10/13/2015   No results found for: PROLACTIN Lab Results  Component Value Date   CHOL 156 12/23/2019   TRIG 72.0 12/23/2019   HDL 56.10 12/23/2019   CHOLHDL 3 12/23/2019   VLDL 14.4 12/23/2019   LDLCALC 86 12/23/2019   LDLCALC 80 12/15/2018   Lab Results  Component Value Date   TSH 4.12 12/23/2019   TSH 1.91 10/06/2018    Therapeutic Level Labs: No results found for: LITHIUM No results found for: VALPROATE No components found for:  CBMZ  Current Medications: Current Outpatient Medications  Medication Sig Dispense Refill  . ALPRAZolam (XANAX) 0.5 MG tablet Take 1 tablet (0.5 mg total) by mouth 2 (two) times daily. 180 tablet 2  . amitriptyline (ELAVIL) 25 MG tablet Take 1 tablet (25 mg total) by mouth at bedtime. 90 tablet 2  . antiseptic oral rinse (BIOTENE) LIQD 15 mLs by Mouth Rinse route at bedtime.    Marland Kitchen apixaban (ELIQUIS) 5 MG TABS tablet Take 1 tablet (5 mg total) by mouth 2 (two) times daily. 60 tablet 6  . cholecalciferol (VITAMIN D) 1000 UNITS tablet Take 1,000 Units by mouth daily.    Marland Kitchen Co-Enzyme Q-10 100 MG CAPS Take 1 capsule (100 mg total) by mouth daily.  0  . diclofenac sodium  (VOLTAREN) 1 % GEL Apply 1 application topically 2 (two) times daily as needed (pain).    Marland Kitchen diltiazem (CARDIZEM CD) 120 MG 24 hr capsule Take 1 capsule (120 mg total) by mouth daily. 90 capsule 3  . diltiazem (CARDIZEM CD) 360 MG 24 hr capsule Take 1 capsule (360 mg total) by mouth daily. 90 capsule 3  . docusate sodium (COLACE) 100 MG capsule Take 1 capsule (100 mg total) by mouth daily.    Marland Kitchen escitalopram (LEXAPRO) 20 MG tablet Take 1 tablet (20 mg total) by mouth 2 (two) times daily. 180 tablet 2  . ezetimibe (ZETIA) 10 MG tablet Take 1 tablet (10 mg total) by mouth at bedtime. 90 tablet 3  . furosemide (LASIX) 20 MG tablet TAKE 1 TABLET BY MOUTH  DAILY AS NEEDED FOR EDEMA 90 tablet 3  . gabapentin (NEURONTIN) 300 MG capsule Take 2 capsules every morning, 1 capsule every afternoon and 2 capsules at bedtime 450 capsule 3  . losartan (COZAAR) 50 MG tablet Take 1 tablet (50 mg total) by mouth daily. 90 tablet 3  . lovastatin (MEVACOR) 10 MG tablet TAKE 1 TABLET BY MOUTH  EVERY MONDAY, WEDNESDAY,  AND FRIDAY 39 tablet 3  . methocarbamol (ROBAXIN) 500 MG tablet TAKE 1 TABLET (500 MG TOTAL) BY MOUTH 2 (TWO) TIMES DAILY AS NEEDED FOR MUSCLE SPASMS. 14 tablet 0  . metoprolol tartrate (LOPRESSOR) 50 MG tablet Take 1.5 tablets (75 mg total) by mouth 2 (two) times daily. 270 tablet 3  . Polyethyl Glycol-Propyl Glycol (SYSTANE) 0.4-0.3 % GEL ophthalmic gel Place 1 drop into both eyes 2 (two) times daily.     . polyethylene glycol powder (GLYCOLAX/MIRALAX) 17 GM/SCOOP powder Take 8.5-17 g by mouth daily as needed for  moderate constipation. (1/2-1 capful daily as needed) 3350 g 1  . RESTASIS 0.05 % ophthalmic emulsion INSTILL ONE DROP IN BOTH  EYES TWO TIMES DAILY 180 each 0  . traMADol (ULTRAM) 50 MG tablet Take 1 tablet (50 mg total) by mouth daily as needed. 30 tablet 0   No current facility-administered medications for this visit.     Musculoskeletal: Strength & Muscle Tone: within normal limits Gait  & Station: normal Patient leans: N/A  Psychiatric Specialty Exam: Review of Systems  Cardiovascular: Positive for palpitations.  Musculoskeletal: Positive for arthralgias.    There were no vitals taken for this visit.There is no height or weight on file to calculate BMI.  General Appearance: Casual, Neat and Well Groomed  Eye Contact:  Good  Speech:  Clear and Coherent  Volume:  Normal  Mood:  Euthymic  Affect:  Appropriate and Congruent  Thought Process:  Goal Directed  Orientation:  Full (Time, Place, and Person)  Thought Content: WDL   Suicidal Thoughts:  No  Homicidal Thoughts:  No  Memory:  Immediate;   Good Recent;   Good Remote;   Good  Judgement:  Good  Insight:  Good  Psychomotor Activity:  Decreased  Concentration:  Concentration: Good and Attention Span: Good  Recall:  Good  Fund of Knowledge: Good  Language: Good  Akathisia:  No  Handed:  Right  AIMS (if indicated): not done  Assets:  Communication Skills Desire for Improvement Resilience Social Support Talents/Skills  ADL's:  Intact  Cognition: WNL  Sleep:  Good   Screenings: GAD-7   Flowsheet Row Office Visit from 12/28/2019 in [place] at [place] Visit from 12/22/2018 in [place] at [facility]  Total GAD-7 Score 0 5    [place] from 12/11/2017 in [place] at [place] from 12/09/2016 in [place] at [place] from 11/30/2015 in [place] at [facility]  Total Score (max 30 points ) 20 19 18     PHQ2-9   Flowsheet Row Video Visit from 05/24/2020 in [place] Office Visit from 12/28/2019 in [place] at [place] Visit from 12/22/2018 in [place] at [place] from 12/21/2018 in [place] at [place] from 12/11/2017 in [place] at [place]   PHQ-2 Total Score 0 0 0 1 0  PHQ-9 Total Score -- 1 4 1  0    Flowsheet Row Video Visit from 05/24/2020 in [place] No Risk       Assessment and Plan: This patient is a 70 year old female with a history of depression and anxiety.  She continues to do well.  She will continue Lexapro 20 mg twice daily for depression, Xanax 0.5 mg twice daily as needed for anxiety and amitriptyline 25 mg at bedtime for sleep and chronic pain.  She will return to see me in 3 months   [name], MD 05/24/2020, 10:32 AM

## 2020-05-29 DIAGNOSIS — I10 Essential (primary) hypertension: Secondary | ICD-10-CM | POA: Diagnosis not present

## 2020-05-29 DIAGNOSIS — E7800 Pure hypercholesterolemia, unspecified: Secondary | ICD-10-CM | POA: Diagnosis not present

## 2020-05-29 LAB — LIPID PANEL
Chol/HDL Ratio: 2.4 ratio (ref 0.0–4.4)
Cholesterol, Total: 167 mg/dL (ref 100–199)
HDL: 71 mg/dL (ref >39)
LDL Chol Calc (NIH): 82 mg/dL (ref 0–99)
Triglycerides: 75 mg/dL (ref 0–149)
VLDL Cholesterol Cal: 14 mg/dL (ref 5–40)

## 2020-05-29 LAB — COMPREHENSIVE METABOLIC PANEL
ALT: 17 IU/L (ref 0–32)
AST: 23 IU/L (ref 0–40)
Albumin/Globulin Ratio: 1.3 (ref 1.2–2.2)
Albumin: 4 g/dL (ref 3.8–4.8)
Alkaline Phosphatase: 78 IU/L (ref 44–121)
BUN/Creatinine Ratio: 8 — ABNORMAL LOW (ref 12–28)
BUN: 10 mg/dL (ref 8–27)
Bilirubin Total: 0.5 mg/dL (ref 0.0–1.2)
CO2: 21 mmol/L (ref 20–29)
Calcium: 9.7 mg/dL (ref 8.7–10.3)
Chloride: 105 mmol/L (ref 96–106)
Creatinine, Ser: 1.33 mg/dL — ABNORMAL HIGH (ref 0.57–1.00)
Globulin, Total: 3.2 g/dL (ref 1.5–4.5)
Glucose: 105 mg/dL — ABNORMAL HIGH (ref 65–99)
Potassium: 4.6 mmol/L (ref 3.5–5.2)
Sodium: 139 mmol/L (ref 134–144)
Total Protein: 7.2 g/dL (ref 6.0–8.5)
eGFR: 44 mL/min/{1.73_m2} — ABNORMAL LOW (ref >59)

## 2020-06-19 ENCOUNTER — Encounter: Payer: Self-pay | Admitting: Nurse Practitioner

## 2020-06-19 NOTE — Progress Notes (Signed)
This encounter was created in error - please disregard.

## 2020-06-22 ENCOUNTER — Telehealth: Payer: Self-pay

## 2020-06-22 NOTE — Chronic Care Management (AMB) (Addendum)
Chronic Care Management Pharmacy Assistant   Name: Alexandria Leach  MRN: [id number] DOB: 1951/02/05  Reason for Encounter: Disease State  HTN   Conditions to be addressed/monitored: HTN  Recent office visits:  07/11/2020 -  Sena Slate  PCP - Due for bone density scan after 09/15/20. Let us know closer to the date to order this.  A1c today. We will discuss kidney doctor pending results. Return as needed or in 6 months for wellness visit with health advisor and physical with me.   Recent consult visits:  07/10/2020 - Dr.Shaili Deveshwar, Rheumatology - Off DMARD since 2021, continue current medications. Return in about 5 months (around 12/10/2020) for Sjogren's, Osteoarthritis. 07/06/2020 - Manuela Neptune, Cardiology - Scheduled cardioversion for 07/18/20. DO NOT TAKE METOPROLOL THE MORNING OF THE PROCEDURE. Continue your anticoagulant: ELIQUIS 06/23/2020  - Estill Bakes, Pulmonology - Schedule home sleep study 05/24/2020  - [place], [place] 05/22/2020  - Dr.Brian Crenshaw, Cardiology  - A-flutter diagnosis, Started Apixaban 5 mg BID, Losartan 50 mg daily, Stop aspirin  Hospital visits:  None in previous 6 months  Medications: Outpatient Encounter Medications as of 06/22/2020  Medication Sig   ALPRAZolam (XANAX) 0.5 MG tablet Take 1 tablet (0.5 mg total) by mouth 2 (two) times daily.   amitriptyline (ELAVIL) 25 MG tablet Take 1 tablet (25 mg total) by mouth at bedtime.   antiseptic oral rinse (BIOTENE) LIQD 15 mLs by Mouth Rinse route at bedtime.   apixaban (ELIQUIS) 5 MG TABS tablet Take 1 tablet (5 mg total) by mouth 2 (two) times daily.   cholecalciferol (VITAMIN D) 1000 UNITS tablet Take 1,000 Units by mouth daily.   Co-Enzyme Q-10 100 MG CAPS Take 1 capsule (100 mg total) by mouth daily.   diclofenac sodium (VOLTAREN) 1 % GEL Apply 1 application topically 2 (two) times daily as needed (pain).   diltiazem (CARDIZEM CD) 120 MG 24 hr capsule Take 1  capsule (120 mg total) by mouth daily.   diltiazem (CARDIZEM CD) 360 MG 24 hr capsule Take 1 capsule (360 mg total) by mouth daily.   docusate sodium (COLACE) 100 MG capsule Take 1 capsule (100 mg total) by mouth daily.   escitalopram (LEXAPRO) 20 MG tablet Take 1 tablet (20 mg total) by mouth 2 (two) times daily.   ezetimibe (ZETIA) 10 MG tablet Take 1 tablet (10 mg total) by mouth at bedtime.   furosemide (LASIX) 20 MG tablet TAKE 1 TABLET BY MOUTH  DAILY AS NEEDED FOR EDEMA   gabapentin (NEURONTIN) 300 MG capsule Take 2 capsules every morning, 1 capsule every afternoon and 2 capsules at bedtime   losartan (COZAAR) 50 MG tablet Take 1 tablet (50 mg total) by mouth daily.   lovastatin (MEVACOR) 10 MG tablet TAKE 1 TABLET BY MOUTH  EVERY MONDAY, WEDNESDAY,  AND FRIDAY   methocarbamol (ROBAXIN) 500 MG tablet TAKE 1 TABLET (500 MG TOTAL) BY MOUTH 2 (TWO) TIMES DAILY AS NEEDED FOR MUSCLE SPASMS.   metoprolol tartrate (LOPRESSOR) 50 MG tablet Take 1.5 tablets (75 mg total) by mouth 2 (two) times daily.   Polyethyl Glycol-Propyl Glycol (SYSTANE) 0.4-0.3 % GEL ophthalmic gel Place 1 drop into both eyes 2 (two) times daily.    polyethylene glycol powder (GLYCOLAX/MIRALAX) 17 GM/SCOOP powder Take 8.5-17 g by mouth daily as needed for moderate constipation. (1/2-1 capful daily as needed)   RESTASIS 0.05 % ophthalmic emulsion INSTILL ONE DROP IN BOTH  EYES TWO TIMES DAILY   traMADol (ULTRAM)  50 MG tablet Take 1 tablet (50 mg total) by mouth daily as needed.   No facility-administered encounter medications on file as of 06/22/2020.   Recent Office Vitals: BP Readings from Last 3 Encounters:  07/11/20 120/68  07/10/20 122/78  07/06/20 (!) 148/78   Pulse Readings from Last 3 Encounters:  07/11/20 70  07/10/20 (!) 58  07/06/20 95    Wt Readings from Last 3 Encounters:  07/11/20 203 lb 1 oz (92.1 kg)  07/10/20 202 lb (91.6 kg)  07/06/20 199 lb 3.2 oz (90.4 kg)     Kidney Function Lab Results   Component Value Date/Time   CREATININE 1.38 (H) 07/11/2020 12:29 PM   CREATININE 1.33 (H) 05/29/2020 08:15 AM   CREATININE 1.28 (H) 04/20/2019 03:11 PM   CREATININE 1.27 (H) 01/21/2019 10:32 AM   GFR 39.48 (L) 07/11/2020 12:29 PM   GFRNONAA 44 (L) 11/11/2019 04:26 PM   GFRNONAA 44 (L) 04/20/2019 03:11 PM   GFRAA 51 (L) 11/11/2019 04:26 PM   GFRAA 50 (L) 04/20/2019 03:11 PM    BMP Latest Ref Rng & Units 07/11/2020 05/29/2020 12/23/2019  Glucose 70 - 99 mg/dL 90 105(H) 113(H)  BUN 6 - 23 mg/dL 12 10 12   Creatinine 0.40 - 1.20 mg/dL 1.38(H) 1.33(H) 1.28(H)  BUN/Creat Ratio 12 - 28 - 8(L) -  Sodium 135 - 145 mEq/L 138 139 139  Potassium 3.5 - 5.1 mEq/L 4.4 4.6 4.2  Chloride 96 - 112 mEq/L 106 105 107  CO2 19 - 32 mEq/L 27 21 24   Calcium 8.4 - 10.5 mg/dL 9.8 9.7 9.7    Hypertension  Multiple attempts to reach the patient (06/22/20, 06/23/20, 06/26/20, 07/10/20), unsuccessful outreach.  Current antihypertensive regimen:   Diltiazem 360 mg 24 hr - 1 capsule daily   Metoprolol tartrate 50 mg - 1 and 1/2 tablets (75 mg) twice daily  Losartan 50 mg - 1 tablet daily  What recent interventions/DTPs have been made by any provider to improve Blood Pressure control since last CPP Visit    05/22/2020 Cardiology added losartan 50 mg daily  Any recent hospitalizations or ED visits since last visit with CPP? No    Adherence Review: Is the patient currently on ACE/ARB medication? Yes Does the patient have >5 day gap between last estimated fill dates? No gaps in adherence  Star Rating Drugs:  Medication:  Last Fill: Day Supply Losartan 50mg . 05/22/2020 90ds Lovastatin 10mg . 03/31/2020 90ds  Follow-Up:  Pharmacist Review  Debbora Dus, CPP notified  Avel Sensor, [place] Assistant [phone]  I have reviewed the care management and care coordination activities outlined in this encounter and I am certifying that I agree with the content of this note. No further action  required.  Debbora Dus, PharmD Clinical Pharmacist [facility] [phone]

## 2020-06-23 ENCOUNTER — Ambulatory Visit (INDEPENDENT_AMBULATORY_CARE_PROVIDER_SITE_OTHER): Payer: Medicare Other | Admitting: Pulmonary Disease

## 2020-06-23 ENCOUNTER — Other Ambulatory Visit: Payer: Self-pay

## 2020-06-23 ENCOUNTER — Encounter: Payer: Self-pay | Admitting: Pulmonary Disease

## 2020-06-23 VITALS — BP 134/88 | HR 71 | Temp 97.2°F | Ht 61.0 in | Wt 200.4 lb

## 2020-06-23 DIAGNOSIS — I483 Typical atrial flutter: Secondary | ICD-10-CM

## 2020-06-23 DIAGNOSIS — I4892 Unspecified atrial flutter: Secondary | ICD-10-CM | POA: Insufficient documentation

## 2020-06-23 DIAGNOSIS — R0683 Snoring: Secondary | ICD-10-CM | POA: Diagnosis not present

## 2020-06-23 NOTE — Progress Notes (Signed)
Subjective:    Patient ID: Alexandria Leach, female    DOB: 1950-07-23, 70 y.o.   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  She was found to be in new onset atrial flutter by cardiology, placed on apixaban, rate control with metoprolol and Cardizem and cardioversion is planned Have reviewed the consultation, it is felt that she will eventually require double valve replacement  PMH -mitral valve repair 1980 due to rheumatic heart disease -now has MS/MR, AAS/AI Sjogren's syndrome. CKD stage III Hypertension VTE  Her husband sleeps in a different room but is still able to hear her loud snoring.  He has witnessed apneas during her sleep especially when she lies on her back. Epworth sleepiness score is 4 and she denies daytime somnolence and fatigue. Bedtime is around 10 PM, sleep latency about 30 minutes, there is a TV in the bedroom but she turns this off at bedtime, she sleeps on her side with 3 pillows but often slides of the pillows, reports 1 nocturnal awakening due to nocturia and is out of bed around 7 AM with dryness of mouth but feels rested and denies headaches. She has gained 7 pounds during the pandemic and admits to sedentary lifestyle She is retired There is no history suggestive of cataplexy, sleep paralysis or parasomnias     Past Medical History:  Diagnosis Date  . Ankle fracture, left 02/19/2015   from a fall  . Anxiety   . Cataract 2014   corrected with surgery  . CHF (congestive heart failure) ([place])   . CKD (chronic kidney disease) stage 3, GFR 30-59 ml/min [facility])    saw nephrologist Dr Harden Mo  . DDD (degenerative disc disease), cervical   . Depression   . Fibromyalgia   . Glaucoma    s/p surgery, sees ophtho Q6 mo  . 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 History of kidney stones 2010  . History of pulmonary embolism  2001, 2006   completed coumadin courses  . History of rheumatic fever x3  . HLD (hyperlipidemia)   . HTN (hypertension)   . Insomnia   . Lung nodule 09/22/2012   RLL - 64mm, stable since 2014. Thought benign.   . Osteoarthritis    shoulders and knees, not RA per Dr Estanislado Pandy, positive ANA, positive Ro  . Osteopenia 09/18/2015   DEXA T -1.1 hip, -0.2 spine 08/2015   . Personal history of urinary calculi latest 2014  . Pneumonia 12/02/2011  . PONV (postoperative nausea and vomiting)   . Refusal of blood transfusions as patient is Jehovah's Witness   . Rheumatic heart disease 1980   s/p mitral valve repair 1980  . Sjogren's syndrome ([place])   . Trimalleolar fracture of left ankle 02/23/2015   Past Surgical History:  Procedure Laterality Date  . BREAST BIOPSY Right 2006   benign  . CHOLECYSTECTOMY  11/27/2011   Procedure: LAPAROSCOPIC CHOLECYSTECTOMY WITH INTRAOPERATIVE CHOLANGIOGRAM;  Surgeon: Adin Hector, MD;  Location: [place];  Service: General;  Laterality: N/A;  laparoscopic cholecystectomy with choleangiogram umbilical hernia repair  . COLONOSCOPY  07/2014   WNL Amedeo Plenty)  . COLONOSCOPY  08/2019   3 TAs, rpt 3 yrs ([place])  . dexa  08/2012   normal per patient - no records available  . ESOPHAGOGASTRODUODENOSCOPY  08/2019   reactive gastropathy, neg H pylori ([place])  . EYE SURGERY Bilateral 2014  cataract removal  . [place]   open heart  . ORIF ANKLE FRACTURE Left 02/26/2015   Procedure: OPEN REDUCTION INTERNAL FIXATION (ORIF) LEFT TRIMALLEOLAR ANKLE FRACTURE;  Surgeon: [name], MD;  Location: [place];  Service: Orthopedics;  Laterality: Left;  . TUBAL LIGATION  1980  . UMBILICAL HERNIA REPAIR  11/27/2011   Procedure: HERNIA REPAIR UMBILICAL ADULT;  Surgeon: Adin Hector, MD;  Location: [place];  Service: General;  Laterality: N/A;  . [place]   for fibroids -- partial, ovaries remain    Allergies  Allergen Reactions  .  Cymbalta [Duloxetine Hcl] Other (See Comments)    tachycardia  . Statins Nausea Only and Other (See Comments)    Muscle cramps also  . Sulfa Antibiotics Nausea And Vomiting    Social History   Socioeconomic History  . Marital status: Married    Spouse name: [name]  . Number of children: 4  . Years of education: 25  . Highest education level: Not on file  Occupational History    Employer: UNEMPLOYED    Comment: Disability  Tobacco Use  . Smoking status: Never Smoker  . Smokeless tobacco: Never Used  Vaping Use  . Vaping Use: Never used  Substance and Sexual Activity  . Alcohol use: No    Alcohol/week: 0.0 standard drinks  . Drug use: No  . Sexual activity: Not Currently    Birth control/protection: Surgical  Other Topics Concern  . Not on file  Social History Narrative   Lives with son, 1 dog   Occupation: unemployed, on disability for fibromyalgia since 2008.   Edu: HS   Religion: Jehova's witness   Activity: volunteers at senior center   Diet: some water, fruits/vegetables daily   No caffeine use   Social Determinants of Health   Financial Resource Strain: Not on file  Food Insecurity: Not on file  Transportation Needs: Not on file  Physical Activity: Not on file  Stress: Not on file  Social Connections: Not on file  Intimate Partner Violence: Not on file    Family History  Problem Relation Age of Onset  . Lupus Sister        and niece  . Cancer Mother        lung (nonsmoker)  . CAD Mother        MI in her 49s  . ALS Mother   . Kidney disease Father   . Alcohol abuse Father   . Diabetes Father   . Cancer Brother        bone  . Diabetes Sister   . Stroke Sister   . Cancer Maternal Uncle        bone  . Depression Sister   . Lupus Sister   . Kidney failure Other        on HD  . Diabetes Brother   . Heart attack Brother   . Stroke Maternal Grandmother   . Blindness Sister   . Healthy Son   . Healthy Son   . Healthy Son   . Healthy Son       Review of Systems   Positive for shortness of breath with activity Anxiety Feet swelling Dryness of mouth and eyes  Constitutional: negative for anorexia, fevers and sweats  Eyes: negative for irritation, redness and visual disturbance  Ears, nose, mouth, throat, and face: negative for earaches, epistaxis, nasal congestion and sore throat  Respiratory: negative for cough,  sputum and  wheezing  Cardiovascular: negative for chest pain,   orthopnea, palpitations and syncope  Gastrointestinal: negative for abdominal pain, constipation, diarrhea, melena, nausea and vomiting  Genitourinary:negative for dysuria, frequency and hematuria  Hematologic/lymphatic: negative for bleeding, easy bruising and lymphadenopathy  Musculoskeletal:negative for arthralgias, muscle weakness and stiff joints  Neurological: negative for coordination problems, gait problems, headaches and weakness  Endocrine: negative for diabetic symptoms including polydipsia, polyuria and weight loss     Objective:   Physical Exam   Gen. Pleasant, short,obese, in no distress, normal affect ENT - no pallor,icterus, no post nasal drip, class 2 airway Neck: No JVD, no thyromegaly, no carotid bruits Lungs: no use of accessory muscles, no dullness to percussion, decreased without rales or rhonchi  Cardiovascular: Rhythm regular, heart sounds  normal, no murmurs or gallops, no peripheral edema Abdomen: soft and non-tender, no hepatosplenomegaly, BS normal. Musculoskeletal: No deformities, no cyanosis or clubbing Neuro:  alert, non focal, no tremors        Assessment & Plan:

## 2020-06-23 NOTE — Assessment & Plan Note (Signed)
Cardioversion is planned, discussed the correlation between maintenance of rhythm and OSA. It is felt that she may eventually require a double valve replacement

## 2020-06-23 NOTE — Patient Instructions (Signed)
Schedule home sleep test. We discussed the correlation between atrial fibrillation and OSA

## 2020-06-23 NOTE — Assessment & Plan Note (Signed)
Given excessive daytime fatigue, narrow pharyngeal exam, witnessed apneas & loud snoring, obstructive sleep apnea is very likely & an overnight polysomnogram will be scheduled as a home study. The pathophysiology of obstructive sleep apnea , it's cardiovascular consequences & modes of treatment including CPAP were discused with the patient in detail & they evidenced understanding.  We discussed the correlation between OSA and atrial fibrillation.  Her husband has been diagnosed with OSA but does not use a CPAP machine.  She is similar with CPAP therapy.  She would be willing to use this if absolutely necessary.  I have asked her to continue on her amitriptyline and Xanax as needed

## 2020-06-26 NOTE — Progress Notes (Signed)
Office Visit Note  Patient: Alexandria Leach             Date of Birth: March 30, 1950           MRN: [id number]             PCP: Ria Bush, MD Referring: Ria Bush, MD Visit Date: 07/10/2020 Occupation: @GUAROCC @  Subjective:  Pain in both hips.   History of Present Illness: Alexandria Leach is a 70 y.o. female with a history of Sjogren's, osteoarthritis and fibromyalgia syndrome.  She continues to have dry mouth and dry eyes.  She is not taking any DMARDs now.  She states her knee joint discomfort is improved after the Visco supplement injection in November 2021.  She denies any lower back pain currently.  She continues to have some generalized pain and discomfort from fibromyalgia.  She continues to have fatigue and insomnia.  Activities of Daily Living:  Patient reports morning stiffness for 1 hour.   Patient Reports nocturnal pain.  Difficulty dressing/grooming: Denies Difficulty climbing stairs: Reports Difficulty getting out of chair: Reports Difficulty using hands for taps, buttons, cutlery, and/or writing: Reports  Review of Systems  Constitutional: Positive for fatigue. Negative for night sweats, weight gain and weight loss.  HENT: Positive for mouth dryness and nose dryness. Negative for mouth sores, trouble swallowing and trouble swallowing.   Eyes: Positive for itching and dryness. Negative for pain, redness and visual disturbance.  Respiratory: Negative for cough, hemoptysis, shortness of breath and difficulty breathing.   Cardiovascular: Positive for chest pain, palpitations and swelling in legs/feet. Negative for hypertension and irregular heartbeat.       Schedule for cardioversion.  Gastrointestinal: Positive for constipation. Negative for abdominal pain, blood in stool and diarrhea.  Endocrine: Negative for increased urination.  Genitourinary: Negative for painful urination and vaginal dryness.  Musculoskeletal: Positive for arthralgias,  joint pain, myalgias, muscle weakness, morning stiffness, muscle tenderness and myalgias. Negative for joint swelling.  Skin: Negative for color change, rash, hair loss, redness, skin tightness, ulcers and sensitivity to sunlight.  Allergic/Immunologic: Negative for susceptible to infections.  Neurological: Positive for dizziness and weakness. Negative for numbness, headaches, memory loss and night sweats.  Hematological: Negative for swollen glands.  Psychiatric/Behavioral: Positive for sleep disturbance. Negative for depressed mood and confusion. The patient is nervous/anxious.     PMFS History:  Patient Active Problem List   Diagnosis Date Noted  . Snoring 06/23/2020  . Atrial flutter ([place]) 06/23/2020  . Dysphagia 05/15/2020  . Secondary hyperparathyroidism of renal origin ([place]) 12/28/2019  . Constipation 12/28/2019  . Sinus tachycardia 05/27/2018  . Transient alteration of awareness 04/04/2018  . Abnormal serum thyroid stimulating hormone (TSH) level 01/30/2018  . 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[place]) 12/22/2017  . Thyroid nodule 09/16/2017  . Neck pain on right side 05/14/2017  . Sialoadenitis of submandibular gland 10/29/2016  . High risk medication use 06/05/2016  . Peripheral neuropathy 01/30/2016  . DNR no code (do not resuscitate) 12/08/2015  . 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 TIA (transient ischemic attack) 10/12/2015  . Mild mitral regurgitation 10/12/2015  . Osteopenia 09/18/2015  . Right arm pain 08/29/2015  . History of fall   . Right sided sciatica 02/21/2015  . Imbalance 01/12/2015  . Transaminitis 12/24/2014  . DDD (degenerative disc disease), cervical   . Health maintenance examination 10/18/2014  . Advanced care planning/counseling discussion 10/18/2014  . Medicare annual wellness visit, subsequent 10/18/2014  . Refusal of blood transfusions as  patient is Jehovah's Witness   . Sjogren's syndrome ([place])   . CKD (chronic kidney disease)  stage 3, GFR 30-59 ml/min (HCC)   . Glaucoma   . Fibromyalgia   . Osteoarthritis   . History of pulmonary embolism   . Lung nodule 09/22/2012  . Diastolic CHF ([place]) 81/03/7508  . Aortic stenosis 04/22/2011  . Palpitations 08/10/2010  . HOT FLASHES 06/28/2008  . ARTHRALGIA 07/01/2007  . BACK PAIN, LEFT 08/25/2006  . GAD (generalized anxiety disorder) 03/28/2006  . History of mitral valve repair 03/28/2006  . History of total abdominal hysterectomy 03/28/2006  . HYPERCHOLESTEROLEMIA 12/31/2005  . MDD (major depressive disorder), recurrent episode ([place]) 12/31/2005  . RHEUMATIC HEART DISEASE 12/31/2005  . Essential hypertension 12/31/2005  . Chronic insomnia 12/31/2005    Past Medical History:  Diagnosis Date  . Ankle fracture, left 02/19/2015   from a fall  . Anxiety   . Cataract 2014   corrected with surgery  . CHF (congestive heart failure) ([place])   . CKD (chronic kidney disease) stage 3, GFR 30-59 ml/min [facility])    saw nephrologist Dr Harden Mo  . DDD (degenerative disc disease), cervical   . Depression   . Fibromyalgia   . Glaucoma    s/p surgery, sees ophtho Q6 mo  . 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 History of kidney stones 2010  . History of pulmonary embolism 2001, 2006   completed coumadin courses  . History of rheumatic fever x3  . HLD (hyperlipidemia)   . HTN (hypertension)   . Insomnia   . Lung nodule 09/22/2012   RLL - 19mm, stable since 2014. Thought benign.   . Osteoarthritis    shoulders and knees, not RA per Dr Estanislado Pandy, positive ANA, positive Ro  . Osteopenia 09/18/2015   DEXA T -1.1 hip, -0.2 spine 08/2015   . Personal history of urinary calculi latest 2014  . Pneumonia 12/02/2011  . PONV (postoperative nausea and vomiting)   . Refusal of blood transfusions as patient is Jehovah's Witness   . Rheumatic heart disease 1980   s/p mitral valve repair 1980  . Sjogren's syndrome ([place])   .  Trimalleolar fracture of left ankle 02/23/2015    Family History  Problem Relation Age of Onset  . Lupus Sister        and niece  . Cancer Mother        lung (nonsmoker)  . CAD Mother        MI in her 63s  . ALS Mother   . Kidney disease Father   . Alcohol abuse Father   . Diabetes Father   . Cancer Brother        bone  . Diabetes Sister   . Stroke Sister   . Cancer Maternal Uncle        bone  . Depression Sister   . Lupus Sister   . Kidney failure Other        on HD  . Diabetes Brother   . Heart attack Brother   . Stroke Maternal Grandmother   . Blindness Sister   . Healthy Son   . Healthy Son   . Healthy Son   . Healthy Son    Past Surgical History:  Procedure Laterality Date  . BREAST BIOPSY Right 2006   benign  . CHOLECYSTECTOMY  11/27/2011   Procedure: LAPAROSCOPIC CHOLECYSTECTOMY WITH INTRAOPERATIVE CHOLANGIOGRAM;  Surgeon: Adin Hector, MD;  [facility];  Service: General;  Laterality: N/A;  laparoscopic cholecystectomy with choleangiogram umbilical hernia repair  . COLONOSCOPY  07/2014   WNL Amedeo [facility]enty)  . COLONOSCOPY  08/2019   3 TAs, rpt 3 yrs ([place])  . dexa  08/2012   normal per patient - no records available  . ESOPHAGOGASTRODUODENOSCOPY  08/2019   reactive gastropathy, neg H pylori ([place])  . EYE SURGERY Bilateral 2014   cataract removal  . MITRAL VALVE REPAIR  1980   open heart  . ORIF ANKLE FRACTURE Left 02/26/2015   Procedure: OPEN REDUCTION INTERNAL FIXATION (ORIF) LEFT TRIMALLEOLAR ANKLE FRACTURE;  Surgeon: [name], MD;  Location: [place];  Service: Orthopedics;  Laterality: Left;  . TUBAL LIGATION  1980  . UMBILICAL HERNIA REPAIR  11/27/2011   Procedure: HERNIA REPAIR UMBILICAL ADULT;  Surgeon: Adin Hector, MD;  Location: [place];  Service: General;  Laterality: N/A;  . [place]   for fibroids -- partial, ovaries remain   Social History   Social History Narrative   Lives with son, 1 dog   Occupation:  unemployed, on disability for fibromyalgia since 2008.   Edu: HS   Religion: Jehova's witness   Activity: volunteers at senior center   Diet: some water, fruits/vegetables daily   No caffeine use   Immunization History  Administered Date(s) Administered  . PFIZER(Purple Top)SARS-COV-2 Vaccination 05/12/2019, 06/09/2019, 01/06/2020  . Pneumococcal Conjugate-13 12/15/2017  . Pneumococcal Polysaccharide-23 09/15/2012, 12/22/2018     Objective: Vital Signs: BP 122/78 (BP Location: Left Arm, Patient Position: Sitting, Cuff Size: Normal)   Pulse (!) 58   Ht 5\' 1"  (1.549 m)   Wt 202 lb (91.6 kg)   BMI 38.17 kg/m    Physical Exam Vitals and nursing note reviewed.  Constitutional:      Appearance: She is well-developed.  HENT:     Head: Normocephalic and atraumatic.  Eyes:     Conjunctiva/sclera: Conjunctivae normal.  Cardiovascular:     Rate and Rhythm: Normal rate and regular rhythm.     Heart sounds: Normal heart sounds.  Pulmonary:     Effort: Pulmonary effort is normal.     Breath sounds: Normal breath sounds.  Abdominal:     General: Bowel sounds are normal.     Palpations: Abdomen is soft.  Musculoskeletal:     Cervical back: Normal range of motion.  Lymphadenopathy:     Cervical: No cervical adenopathy.  Skin:    General: Skin is warm and dry.     Capillary Refill: Capillary refill takes less than 2 seconds.  Neurological:     Mental Status: She is alert and oriented to person, place, and time.  Psychiatric:        Behavior: Behavior normal.      Musculoskeletal Exam: C-spine was in good range of motion.  Shoulder joints, elbow joints, wrist joints, MCPs PIPs and DIPs with good range of motion with no synovitis.  Hip joints, knee joints, ankles, MTPs and PIPs with good range of motion with no synovitis.  CDAI Exam: CDAI Score: -- Patient Global: --; Provider Global: -- Swollen: --; Tender: -- Joint Exam 07/10/2020   No joint exam has been documented for  this visit   There is currently no information documented on the homunculus. Go to the Rheumatology activity and complete the homunculus joint exam.  Investigation: No additional findings.  Imaging: No results found.  Recent Labs: Lab Results  Component Value Date   WBC  4.5 12/23/2019   HGB 12.0 12/23/2019   PLT 287.0 12/23/2019   NA 139 05/29/2020   K 4.6 05/29/2020   CL 105 05/29/2020   CO2 21 05/29/2020   GLUCOSE 105 (H) 05/29/2020   BUN 10 05/29/2020   CREATININE 1.33 (H) 05/29/2020   BILITOT 0.5 05/29/2020   ALKPHOS 78 05/29/2020   AST 23 05/29/2020   ALT 17 05/29/2020   PROT 7.2 05/29/2020   ALBUMIN 4.0 05/29/2020   CALCIUM 9.7 05/29/2020   GFRAA 51 (L) 11/11/2019    Speciality Comments: PLQ Eye Exam: 08/03/2018 WNL @ [facility] Follow up in 1 year  Procedures:  No procedures performed Allergies: Cymbalta [duloxetine hcl], Statins, and Sulfa antibiotics   Assessment / Plan:     Visit Diagnoses: Sjogren's syndrome with keratoconjunctivitis sicca ([place]) - +ANA, +Ro: Patient continues to have sicca symptoms which are controlled with over-the-counter medications.  Other over-the-counter supplements were discussed.  High risk medication use - Discontinued PLQ in Feb 2021.  She is not taking any immunosuppressive agents at this time.   Chronic pain of right knee-improved after Visco supplement injections.  DDD (degenerative disc disease), cervical-she continues to have some stiffness in the cervical spine.  Osteopenia of multiple sites - DEXA 09/14/2015 BMD left FN is 0.891 with T score -1.1.  She will have repeat DEXA with her PCP.  Fibromyalgia-she continues to have some generalized pain and discomfort.  Stretching exercises were discussed.  She is on Robaxin 500 mg p.o. twice daily as needed.  Increased risk of drowsiness and falling with muscle relaxers was discussed.  Have advised her to use Robaxin only occasionally as needed.  Other chronic pain - tramadol  as needed for pain relief. UDS & narcotic agreement: 02/14/2020.  Patient states she takes tramadol only 1 tablet p.o. nightly as needed.  Other insomnia-she continues to have chronic insomnia.  Other fatigue-related to fibromyalgia and insomnia.  History of hypertension-blood pressure is normal today.  Atrial flutter-patient gives history of atrial flutter.  She states she is scheduled for cardioversion.  History of hypercholesterolemia-increased risk of heart disease with autoimmune disease was discussed.  Need for regular exercise and dietary modifications were discussed.  History of pulmonary embolism - She is on Eliquis.  Stage 3a chronic kidney disease (HCC)-she is not seeing a nephrologist currently.  Have advised her to discuss this further with her PCP.  Thyroid nodule - Thyroid ultrasound obtained on 10/27/19.  Impression discussed with the patient and a hardcopy was provided to her.  No further follow up was recommended.   History of anxiety  Orders: No orders of the defined types were placed in this encounter.  No orders of the defined types were placed in this encounter.   Follow-Up Instructions: Return in about 5 months (around 12/10/2020) for Sjogren's, Osteoarthritis.   Bo Merino, MD  Note - This record has been created using Editor, commissioning.  Chart creation errors have been sought, but may not always  have been located. Such creation errors do not reflect on  the standard of medical care.

## 2020-07-01 NOTE — H&P (View-Only) (Signed)
HPI: FU mitral valve repair secondary to rheumatic heart disease and atrial flutter. Patient is status post mitral valve repair in 1980. Operative report not available. Nuclear study 11/14 showed EF 66 and normal perfusion. Holter 11/14 showed sinus with pacs, pvcs and brief PAT.Carotid Dopplers October 2019 showed no significant obstruction. Echocardiogram August 2021 showed normal LV function, grade 2 diastolic dysfunction, severe left atrial enlargement,moderate mitral stenosis, moderate mitral regurgitation, mild aortic stenosis, mild aortic insufficiency and moderate pulmonary hypertension. Patient found to be in atrial flutter at last office visit.  Since last seen,she continues to have dyspnea on exertion and mild orthopnea.  Minimal pedal edema.  She denies chest pain, palpitations or syncope.  Current Outpatient Medications  Medication Sig Dispense Refill  . ALPRAZolam (XANAX) 0.5 MG tablet Take 1 tablet (0.5 mg total) by mouth 2 (two) times daily. 180 tablet 2  . amitriptyline (ELAVIL) 25 MG tablet Take 1 tablet (25 mg total) by mouth at bedtime. 90 tablet 2  . amLODipine (NORVASC) 5 MG tablet     . antiseptic oral rinse (BIOTENE) LIQD 15 mLs by Mouth Rinse route at bedtime.    Marland Kitchen apixaban (ELIQUIS) 5 MG TABS tablet Take 1 tablet (5 mg total) by mouth 2 (two) times daily. 60 tablet 6  . buPROPion (WELLBUTRIN XL) 300 MG 24 hr tablet     . cholecalciferol (VITAMIN D) 1000 UNITS tablet Take 1,000 Units by mouth daily.    Marland Kitchen Co-Enzyme Q-10 100 MG CAPS Take 1 capsule (100 mg total) by mouth daily.  0  . diclofenac sodium (VOLTAREN) 1 % GEL Apply 1 application topically 2 (two) times daily as needed (pain).    Marland Kitchen diltiazem (CARDIZEM CD) 120 MG 24 hr capsule Take 1 capsule (120 mg total) by mouth daily. 90 capsule 3  . diltiazem (CARDIZEM CD) 360 MG 24 hr capsule Take 1 capsule (360 mg total) by mouth daily. 90 capsule 3  . docusate sodium (COLACE) 100 MG capsule Take 1 capsule (100 mg  total) by mouth daily.    Marland Kitchen escitalopram (LEXAPRO) 20 MG tablet Take 1 tablet (20 mg total) by mouth 2 (two) times daily. 180 tablet 2  . ezetimibe (ZETIA) 10 MG tablet Take 1 tablet (10 mg total) by mouth at bedtime. 90 tablet 3  . furosemide (LASIX) 20 MG tablet TAKE 1 TABLET BY MOUTH  DAILY AS NEEDED FOR EDEMA 90 tablet 3  . gabapentin (NEURONTIN) 300 MG capsule Take 2 capsules every morning, 1 capsule every afternoon and 2 capsules at bedtime 450 capsule 3  . losartan (COZAAR) 50 MG tablet Take 1 tablet (50 mg total) by mouth daily. 90 tablet 3  . lovastatin (MEVACOR) 10 MG tablet TAKE 1 TABLET BY MOUTH  EVERY MONDAY, WEDNESDAY,  AND FRIDAY 39 tablet 3  . methocarbamol (ROBAXIN) 500 MG tablet TAKE 1 TABLET (500 MG TOTAL) BY MOUTH 2 (TWO) TIMES DAILY AS NEEDED FOR MUSCLE SPASMS. 14 tablet 0  . metoprolol tartrate (LOPRESSOR) 50 MG tablet Take 1.5 tablets (75 mg total) by mouth 2 (two) times daily. 270 tablet 3  . pantoprazole (PROTONIX) 40 MG tablet     . Polyethyl Glycol-Propyl Glycol (SYSTANE) 0.4-0.3 % GEL ophthalmic gel Place 1 drop into both eyes 2 (two) times daily.     . polyethylene glycol powder (GLYCOLAX/MIRALAX) 17 GM/SCOOP powder Take 8.5-17 g by mouth daily as needed for moderate constipation. (1/2-1 capful daily as needed) 3350 g 1  . RESTASIS 0.05 %  ophthalmic emulsion INSTILL ONE DROP IN BOTH  EYES TWO TIMES DAILY 180 each 0  . traMADol (ULTRAM) 50 MG tablet Take 1 tablet (50 mg total) by mouth daily as needed. 30 tablet 0   No current facility-administered medications for this visit.     Past Medical History:  Diagnosis Date  . Ankle fracture, left 02/19/2015   from a fall  . Anxiety   . Cataract 2014   corrected with surgery  . CHF (congestive heart failure) ([place])   . CKD (chronic kidney disease) stage 3, GFR 30-59 ml/min [facility])    saw nephrologist Dr Harden Mo  . DDD (degenerative disc disease), cervical   . Depression   . Fibromyalgia   . Glaucoma    s/p surgery,  sees ophtho Q6 mo  . 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 History of kidney stones 2010  . History of pulmonary embolism 2001, 2006   completed coumadin courses  . History of rheumatic fever x3  . HLD (hyperlipidemia)   . HTN (hypertension)   . Insomnia   . Lung nodule 09/22/2012   RLL - 40mm, stable since 2014. Thought benign.   . Osteoarthritis    shoulders and knees, not RA per Dr Estanislado Pandy, positive ANA, positive Ro  . Osteopenia 09/18/2015   DEXA T -1.1 hip, -0.2 spine 08/2015   . Personal history of urinary calculi latest 2014  . Pneumonia 12/02/2011  . PONV (postoperative nausea and vomiting)   . Refusal of blood transfusions as patient is Jehovah's Witness   . Rheumatic heart disease 1980   s/p mitral valve repair 1980  . Sjogren's syndrome ([place])   . Trimalleolar fracture of left ankle 02/23/2015    Past Surgical History:  Procedure Laterality Date  . BREAST BIOPSY Right 2006   benign  . CHOLECYSTECTOMY  11/27/2011   Procedure: LAPAROSCOPIC CHOLECYSTECTOMY WITH INTRAOPERATIVE CHOLANGIOGRAM;  Surgeon: Adin Hector, MD;  Location: [place];  Service: General;  Laterality: N/A;  laparoscopic cholecystectomy with choleangiogram umbilical hernia repair  . COLONOSCOPY  07/2014   WNL Amedeo Plenty)  . COLONOSCOPY  08/2019   3 TAs, rpt 3 yrs ([place])  . dexa  08/2012   normal per patient - no records available  . ESOPHAGOGASTRODUODENOSCOPY  08/2019   reactive gastropathy, neg H pylori ([place])  . EYE SURGERY Bilateral 2014   cataract removal  . MITRAL VALVE REPAIR  1980   open heart  . ORIF ANKLE FRACTURE Left 02/26/2015   Procedure: OPEN REDUCTION INTERNAL FIXATION (ORIF) LEFT TRIMALLEOLAR ANKLE FRACTURE;  Surgeon: [name], MD;  Location: [place];  Service: Orthopedics;  Laterality: Left;  . TUBAL LIGATION  1980  . UMBILICAL HERNIA REPAIR  11/27/2011   Procedure: HERNIA REPAIR UMBILICAL ADULT;  Surgeon:  Adin Hector, MD;  Location: [place];  Service: General;  Laterality: N/A;  . [place]   for fibroids -- partial, ovaries remain    Social History   Socioeconomic History  . Marital status: Married    Spouse name: [name]  . Number of children: 4  . Years of education: 44  . Highest education level: Not on file  Occupational History    Employer: UNEMPLOYED    Comment: Disability  Tobacco Use  . Smoking status: Never Smoker  . Smokeless tobacco: Never Used  Vaping Use  . Vaping Use: Never used  Substance and Sexual Activity  . Alcohol use:  No    Alcohol/week: 0.0 standard drinks  . Drug use: No  . Sexual activity: Not Currently    Birth control/protection: Surgical  Other Topics Concern  . Not on file  Social History Narrative   Lives with son, 1 dog   Occupation: unemployed, on disability for fibromyalgia since 2008.   Edu: HS   Religion: Jehova's witness   Activity: volunteers at senior center   Diet: some water, fruits/vegetables daily   No caffeine use   Social Determinants of Health   Financial Resource Strain: Not on file  Food Insecurity: Not on file  Transportation Needs: Not on file  Physical Activity: Not on file  Stress: Not on file  Social Connections: Not on file  Intimate Partner Violence: Not on file    Family History  Problem Relation Age of Onset  . Lupus Sister        and niece  . Cancer Mother        lung (nonsmoker)  . CAD Mother        MI in her 32s  . ALS Mother   . Kidney disease Father   . Alcohol abuse Father   . Diabetes Father   . Cancer Brother        bone  . Diabetes Sister   . Stroke Sister   . Cancer Maternal Uncle        bone  . Depression Sister   . Lupus Sister   . Kidney failure Other        on HD  . Diabetes Brother   . Heart attack Brother   . Stroke Maternal Grandmother   . Blindness Sister   . Healthy Son   . Healthy Son   . Healthy Son   . Healthy Son     ROS: no fevers or  chills, productive cough, hemoptysis, dysphasia, odynophagia, melena, hematochezia, dysuria, hematuria, rash, seizure activity, orthopnea, PND, claudication. Remaining systems are negative.  Physical Exam: Well-developed well-nourished in no acute distress.  Skin is warm and dry.  HEENT is normal.  Neck is supple.  Chest is clear to auscultation with normal expansion.  Cardiovascular exam is irregular Abdominal exam nontender or distended. No masses palpated. Extremities show trace edema. neuro grossly intact  ECG-atrial flutter at a rate of 95, normal axis, nonspecific T wave changes.  Personally reviewed  A/P  1 atrial flutter-patient remains in atrial flutter today.  We will continue metoprolol/Cardizem for rate control.  Continue apixaban.  Plan to proceed with cardioversion.  If she does not hold sinus rhythm will consider addition of antiarrhythmic.  2 previous mitral valve repair-continue SBE prophylaxis.  3 aortic stenosis/aortic insufficiency and mitral stenosis/mitral insufficiency-plan follow-up echocardiogram August 2022.  May ultimately require aortic and mitral valve replacement.  4 chronic diastolic congestive heart failure-she has had mild volume overload now on Lasix 40 mg daily.  We will continue.  Hopefully reestablishing sinus rhythm will improve symptoms.  5 hypertension-blood pressure elevated; will continue to follow and add additional medications if needed.  6 hyperlipidemia-continue statin.  7 palpitations-continue beta-blocker.  8 snoring-she has been evaluated by pulmonary and final results of sleep study pending.  Kirk Ruths, MD

## 2020-07-01 NOTE — Progress Notes (Signed)
HPI: FU mitral valve repair secondary to rheumatic heart disease and atrial flutter. Patient is status post mitral valve repair in 1980. Operative report not available. Nuclear study 11/14 showed EF 66 and normal perfusion. Holter 11/14 showed sinus with pacs, pvcs and brief PAT.Carotid Dopplers October 2019 showed no significant obstruction. Echocardiogram August 2021 showed normal LV function, grade 2 diastolic dysfunction, severe left atrial enlargement,moderate mitral stenosis, moderate mitral regurgitation, mild aortic stenosis, mild aortic insufficiency and moderate pulmonary hypertension. Patient found to be in atrial flutter at last office visit.  Since last seen,she continues to have dyspnea on exertion and mild orthopnea.  Minimal pedal edema.  She denies chest pain, palpitations or syncope.  Current Outpatient Medications  Medication Sig Dispense Refill  . ALPRAZolam (XANAX) 0.5 MG tablet Take 1 tablet (0.5 mg total) by mouth 2 (two) times daily. 180 tablet 2  . amitriptyline (ELAVIL) 25 MG tablet Take 1 tablet (25 mg total) by mouth at bedtime. 90 tablet 2  . amLODipine (NORVASC) 5 MG tablet     . antiseptic oral rinse (BIOTENE) LIQD 15 mLs by Mouth Rinse route at bedtime.    Marland Kitchen apixaban (ELIQUIS) 5 MG TABS tablet Take 1 tablet (5 mg total) by mouth 2 (two) times daily. 60 tablet 6  . buPROPion (WELLBUTRIN XL) 300 MG 24 hr tablet     . cholecalciferol (VITAMIN D) 1000 UNITS tablet Take 1,000 Units by mouth daily.    Marland Kitchen Co-Enzyme Q-10 100 MG CAPS Take 1 capsule (100 mg total) by mouth daily.  0  . diclofenac sodium (VOLTAREN) 1 % GEL Apply 1 application topically 2 (two) times daily as needed (pain).    Marland Kitchen diltiazem (CARDIZEM CD) 120 MG 24 hr capsule Take 1 capsule (120 mg total) by mouth daily. 90 capsule 3  . diltiazem (CARDIZEM CD) 360 MG 24 hr capsule Take 1 capsule (360 mg total) by mouth daily. 90 capsule 3  . docusate sodium (COLACE) 100 MG capsule Take 1 capsule (100 mg  total) by mouth daily.    Marland Kitchen escitalopram (LEXAPRO) 20 MG tablet Take 1 tablet (20 mg total) by mouth 2 (two) times daily. 180 tablet 2  . ezetimibe (ZETIA) 10 MG tablet Take 1 tablet (10 mg total) by mouth at bedtime. 90 tablet 3  . furosemide (LASIX) 20 MG tablet TAKE 1 TABLET BY MOUTH  DAILY AS NEEDED FOR EDEMA 90 tablet 3  . gabapentin (NEURONTIN) 300 MG capsule Take 2 capsules every morning, 1 capsule every afternoon and 2 capsules at bedtime 450 capsule 3  . losartan (COZAAR) 50 MG tablet Take 1 tablet (50 mg total) by mouth daily. 90 tablet 3  . lovastatin (MEVACOR) 10 MG tablet TAKE 1 TABLET BY MOUTH  EVERY MONDAY, WEDNESDAY,  AND FRIDAY 39 tablet 3  . methocarbamol (ROBAXIN) 500 MG tablet TAKE 1 TABLET (500 MG TOTAL) BY MOUTH 2 (TWO) TIMES DAILY AS NEEDED FOR MUSCLE SPASMS. 14 tablet 0  . metoprolol tartrate (LOPRESSOR) 50 MG tablet Take 1.5 tablets (75 mg total) by mouth 2 (two) times daily. 270 tablet 3  . pantoprazole (PROTONIX) 40 MG tablet     . Polyethyl Glycol-Propyl Glycol (SYSTANE) 0.4-0.3 % GEL ophthalmic gel Place 1 drop into both eyes 2 (two) times daily.     . polyethylene glycol powder (GLYCOLAX/MIRALAX) 17 GM/SCOOP powder Take 8.5-17 g by mouth daily as needed for moderate constipation. (1/2-1 capful daily as needed) 3350 g 1  . RESTASIS 0.05 %  ophthalmic emulsion INSTILL ONE DROP IN BOTH  EYES TWO TIMES DAILY 180 each 0  . traMADol (ULTRAM) 50 MG tablet Take 1 tablet (50 mg total) by mouth daily as needed. 30 tablet 0   No current facility-administered medications for this visit.     Past Medical History:  Diagnosis Date  . Ankle fracture, left 02/19/2015   from a fall  . Anxiety   . Cataract 2014   corrected with surgery  . CHF (congestive heart failure) ([place])   . CKD (chronic kidney disease) stage 3, GFR 30-59 ml/min [facility])    saw nephrologist Dr Harden Mo  . DDD (degenerative disc disease), cervical   . Depression   . Fibromyalgia   . Glaucoma    s/p surgery,  sees ophtho Q6 mo  . 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 History of kidney stones 2010  . History of pulmonary embolism 2001, 2006   completed coumadin courses  . History of rheumatic fever x3  . HLD (hyperlipidemia)   . HTN (hypertension)   . Insomnia   . Lung nodule 09/22/2012   RLL - 19mm, stable since 2014. Thought benign.   . Osteoarthritis    shoulders and knees, not RA per Dr Estanislado Pandy, positive ANA, positive Ro  . Osteopenia 09/18/2015   DEXA T -1.1 hip, -0.2 spine 08/2015   . Personal history of urinary calculi latest 2014  . Pneumonia 12/02/2011  . PONV (postoperative nausea and vomiting)   . Refusal of blood transfusions as patient is Jehovah's Witness   . Rheumatic heart disease 1980   s/p mitral valve repair 1980  . Sjogren's syndrome ([place])   . Trimalleolar fracture of left ankle 02/23/2015    Past Surgical History:  Procedure Laterality Date  . BREAST BIOPSY Right 2006   benign  . CHOLECYSTECTOMY  11/27/2011   Procedure: LAPAROSCOPIC CHOLECYSTECTOMY WITH INTRAOPERATIVE CHOLANGIOGRAM;  Surgeon: Adin Hector, MD;  Location: [place];  Service: General;  Laterality: N/A;  laparoscopic cholecystectomy with choleangiogram umbilical hernia repair  . COLONOSCOPY  07/2014   WNL Amedeo Plenty)  . COLONOSCOPY  08/2019   3 TAs, rpt 3 yrs ([place])  . dexa  08/2012   normal per patient - no records available  . ESOPHAGOGASTRODUODENOSCOPY  08/2019   reactive gastropathy, neg H pylori ([place])  . EYE SURGERY Bilateral 2014   cataract removal  . MITRAL VALVE REPAIR  1980   open heart  . ORIF ANKLE FRACTURE Left 02/26/2015   Procedure: OPEN REDUCTION INTERNAL FIXATION (ORIF) LEFT TRIMALLEOLAR ANKLE FRACTURE;  Surgeon: [name], MD;  Location: [place];  Service: Orthopedics;  Laterality: Left;  . TUBAL LIGATION  1980  . UMBILICAL HERNIA REPAIR  11/27/2011   Procedure: HERNIA REPAIR UMBILICAL ADULT;  Surgeon:  Adin Hector, MD;  Location: [place];  Service: General;  Laterality: N/A;  . [place]   for fibroids -- partial, ovaries remain    Social History   Socioeconomic History  . Marital status: Married    Spouse name: [name]  . Number of children: 4  . Years of education: 27  . Highest education level: Not on file  Occupational History    Employer: UNEMPLOYED    Comment: Disability  Tobacco Use  . Smoking status: Never Smoker  . Smokeless tobacco: Never Used  Vaping Use  . Vaping Use: Never used  Substance and Sexual Activity  . Alcohol use:  No    Alcohol/week: 0.0 standard drinks  . Drug use: No  . Sexual activity: Not Currently    Birth control/protection: Surgical  Other Topics Concern  . Not on file  Social History Narrative   Lives with son, 1 dog   Occupation: unemployed, on disability for fibromyalgia since 2008.   Edu: HS   Religion: Jehova's witness   Activity: volunteers at senior center   Diet: some water, fruits/vegetables daily   No caffeine use   Social Determinants of Health   Financial Resource Strain: Not on file  Food Insecurity: Not on file  Transportation Needs: Not on file  Physical Activity: Not on file  Stress: Not on file  Social Connections: Not on file  Intimate Partner Violence: Not on file    Family History  Problem Relation Age of Onset  . Lupus Sister        and niece  . Cancer Mother        lung (nonsmoker)  . CAD Mother        MI in her 26s  . ALS Mother   . Kidney disease Father   . Alcohol abuse Father   . Diabetes Father   . Cancer Brother        bone  . Diabetes Sister   . Stroke Sister   . Cancer Maternal Uncle        bone  . Depression Sister   . Lupus Sister   . Kidney failure Other        on HD  . Diabetes Brother   . Heart attack Brother   . Stroke Maternal Grandmother   . Blindness Sister   . Healthy Son   . Healthy Son   . Healthy Son   . Healthy Son     ROS: no fevers or  chills, productive cough, hemoptysis, dysphasia, odynophagia, melena, hematochezia, dysuria, hematuria, rash, seizure activity, orthopnea, PND, claudication. Remaining systems are negative.  Physical Exam: Well-developed well-nourished in no acute distress.  Skin is warm and dry.  HEENT is normal.  Neck is supple.  Chest is clear to auscultation with normal expansion.  Cardiovascular exam is irregular Abdominal exam nontender or distended. No masses palpated. Extremities show trace edema. neuro grossly intact  ECG-atrial flutter at a rate of 95, normal axis, nonspecific T wave changes.  Personally reviewed  A/P  1 atrial flutter-patient remains in atrial flutter today.  We will continue metoprolol/Cardizem for rate control.  Continue apixaban.  Plan to proceed with cardioversion.  If she does not hold sinus rhythm will consider addition of antiarrhythmic.  2 previous mitral valve repair-continue SBE prophylaxis.  3 aortic stenosis/aortic insufficiency and mitral stenosis/mitral insufficiency-plan follow-up echocardiogram August 2022.  May ultimately require aortic and mitral valve replacement.  4 chronic diastolic congestive heart failure-she has had mild volume overload now on Lasix 40 mg daily.  We will continue.  Hopefully reestablishing sinus rhythm will improve symptoms.  5 hypertension-blood pressure elevated; will continue to follow and add additional medications if needed.  6 hyperlipidemia-continue statin.  7 palpitations-continue beta-blocker.  8 snoring-she has been evaluated by pulmonary and final results of sleep study pending.  Kirk Ruths, MD

## 2020-07-06 ENCOUNTER — Other Ambulatory Visit: Payer: Self-pay | Admitting: *Deleted

## 2020-07-06 ENCOUNTER — Ambulatory Visit (INDEPENDENT_AMBULATORY_CARE_PROVIDER_SITE_OTHER): Payer: Medicare Other | Admitting: Cardiology

## 2020-07-06 ENCOUNTER — Other Ambulatory Visit: Payer: Self-pay

## 2020-07-06 ENCOUNTER — Encounter: Payer: Self-pay | Admitting: Cardiology

## 2020-07-06 VITALS — BP 148/78 | HR 95 | Ht 61.0 in | Wt 199.2 lb

## 2020-07-06 DIAGNOSIS — E7800 Pure hypercholesterolemia, unspecified: Secondary | ICD-10-CM | POA: Diagnosis not present

## 2020-07-06 DIAGNOSIS — I340 Nonrheumatic mitral (valve) insufficiency: Secondary | ICD-10-CM | POA: Diagnosis not present

## 2020-07-06 DIAGNOSIS — I10 Essential (primary) hypertension: Secondary | ICD-10-CM | POA: Diagnosis not present

## 2020-07-06 DIAGNOSIS — I4892 Unspecified atrial flutter: Secondary | ICD-10-CM

## 2020-07-06 DIAGNOSIS — I5032 Chronic diastolic (congestive) heart failure: Secondary | ICD-10-CM

## 2020-07-06 DIAGNOSIS — I342 Nonrheumatic mitral (valve) stenosis: Secondary | ICD-10-CM

## 2020-07-06 NOTE — Patient Instructions (Addendum)
GO TO [address] ON Friday 07-14-20 @ 11:15 AM FOR COVID TESTING  You are scheduled for a Cardioversion on Tuesday 07-18-20 with Dr. Debara Pickett.  Please arrive at the [facility] (Main Entrance A) at [facility]: [address] [place], [place] [address] at 11:30 am. (1 hour prior to procedure unless lab work is needed; if lab work is needed arrive 1.5 hours ahead)  DIET: Nothing to eat or drink after midnight except a sip of water with medications (see medication instructions below)  Medication Instructions:  DO NOT TAKE METOPROLOL THE MORNING OF THE PROCEDURE  Continue your anticoagulant: ELIQUIS   You must have a responsible person to drive you home and stay in the waiting area during your procedure. Failure to do so could result in cancellation.  Bring your insurance cards.  *Special Note: Every effort is made to have your procedure done on time. Occasionally there are emergencies that occur at the hospital that may cause delays. Please be patient if a delay does occur.    Your physician recommends that you schedule a follow-up appointment in: [place]

## 2020-07-10 ENCOUNTER — Other Ambulatory Visit: Payer: Self-pay

## 2020-07-10 ENCOUNTER — Ambulatory Visit (INDEPENDENT_AMBULATORY_CARE_PROVIDER_SITE_OTHER): Payer: Medicare Other | Admitting: Rheumatology

## 2020-07-10 ENCOUNTER — Encounter: Payer: Self-pay | Admitting: Rheumatology

## 2020-07-10 VITALS — BP 122/78 | HR 58 | Ht 61.0 in | Wt 202.0 lb

## 2020-07-10 DIAGNOSIS — M3501 Sicca syndrome with keratoconjunctivitis: Secondary | ICD-10-CM | POA: Diagnosis not present

## 2020-07-10 DIAGNOSIS — G8929 Other chronic pain: Secondary | ICD-10-CM | POA: Diagnosis not present

## 2020-07-10 DIAGNOSIS — M8589 Other specified disorders of bone density and structure, multiple sites: Secondary | ICD-10-CM | POA: Diagnosis not present

## 2020-07-10 DIAGNOSIS — Z8679 Personal history of other diseases of the circulatory system: Secondary | ICD-10-CM

## 2020-07-10 DIAGNOSIS — M25561 Pain in right knee: Secondary | ICD-10-CM | POA: Diagnosis not present

## 2020-07-10 DIAGNOSIS — M5030 Other cervical disc degeneration, unspecified cervical region: Secondary | ICD-10-CM | POA: Diagnosis not present

## 2020-07-10 DIAGNOSIS — I483 Typical atrial flutter: Secondary | ICD-10-CM

## 2020-07-10 DIAGNOSIS — G4709 Other insomnia: Secondary | ICD-10-CM | POA: Diagnosis not present

## 2020-07-10 DIAGNOSIS — R5383 Other fatigue: Secondary | ICD-10-CM | POA: Diagnosis not present

## 2020-07-10 DIAGNOSIS — M797 Fibromyalgia: Secondary | ICD-10-CM

## 2020-07-10 DIAGNOSIS — E041 Nontoxic single thyroid nodule: Secondary | ICD-10-CM

## 2020-07-10 DIAGNOSIS — Z8639 Personal history of other endocrine, nutritional and metabolic disease: Secondary | ICD-10-CM | POA: Diagnosis not present

## 2020-07-10 DIAGNOSIS — Z79899 Other long term (current) drug therapy: Secondary | ICD-10-CM

## 2020-07-10 DIAGNOSIS — Z86711 Personal history of pulmonary embolism: Secondary | ICD-10-CM

## 2020-07-10 DIAGNOSIS — Z8659 Personal history of other mental and behavioral disorders: Secondary | ICD-10-CM

## 2020-07-10 DIAGNOSIS — R1319 Other dysphagia: Secondary | ICD-10-CM

## 2020-07-10 DIAGNOSIS — N1831 Chronic kidney disease, stage 3a: Secondary | ICD-10-CM

## 2020-07-11 ENCOUNTER — Encounter: Payer: Self-pay | Admitting: Family Medicine

## 2020-07-11 ENCOUNTER — Other Ambulatory Visit: Payer: Self-pay

## 2020-07-11 ENCOUNTER — Ambulatory Visit (INDEPENDENT_AMBULATORY_CARE_PROVIDER_SITE_OTHER): Payer: Medicare Other | Admitting: Family Medicine

## 2020-07-11 VITALS — BP 120/68 | HR 70 | Temp 97.7°F | Ht 61.0 in | Wt 203.1 lb

## 2020-07-11 DIAGNOSIS — N1832 Chronic kidney disease, stage 3b: Secondary | ICD-10-CM | POA: Diagnosis not present

## 2020-07-11 DIAGNOSIS — N2581 Secondary hyperparathyroidism of renal origin: Secondary | ICD-10-CM

## 2020-07-11 DIAGNOSIS — R7303 Prediabetes: Secondary | ICD-10-CM | POA: Diagnosis not present

## 2020-07-11 DIAGNOSIS — F331 Major depressive disorder, recurrent, moderate: Secondary | ICD-10-CM

## 2020-07-11 DIAGNOSIS — M85852 Other specified disorders of bone density and structure, left thigh: Secondary | ICD-10-CM

## 2020-07-11 DIAGNOSIS — M797 Fibromyalgia: Secondary | ICD-10-CM

## 2020-07-11 DIAGNOSIS — M3501 Sicca syndrome with keratoconjunctivitis: Secondary | ICD-10-CM | POA: Diagnosis not present

## 2020-07-11 DIAGNOSIS — I5032 Chronic diastolic (congestive) heart failure: Secondary | ICD-10-CM

## 2020-07-11 DIAGNOSIS — I483 Typical atrial flutter: Secondary | ICD-10-CM

## 2020-07-11 DIAGNOSIS — Z9889 Other specified postprocedural states: Secondary | ICD-10-CM | POA: Diagnosis not present

## 2020-07-11 LAB — RENAL FUNCTION PANEL
Albumin: 4 g/dL (ref 3.5–5.2)
BUN: 12 mg/dL (ref 6–23)
CO2: 27 mEq/L (ref 19–32)
Calcium: 9.8 mg/dL (ref 8.4–10.5)
Chloride: 106 mEq/L (ref 96–112)
Creatinine, Ser: 1.38 mg/dL — ABNORMAL HIGH (ref 0.40–1.20)
GFR: 39.48 mL/min — ABNORMAL LOW (ref >60.00)
Glucose, Bld: 90 mg/dL (ref 70–99)
Phosphorus: 3.2 mg/dL (ref 2.3–4.6)
Potassium: 4.4 mEq/L (ref 3.5–5.1)
Sodium: 138 mEq/L (ref 135–145)

## 2020-07-11 LAB — HEMOGLOBIN A1C: Hgb A1c MFr Bld: 6.5 % (ref 4.6–6.5)

## 2020-07-11 NOTE — Progress Notes (Signed)
Patient ID: Alexandria Leach, female    DOB: 05/24/1950, 70 y.o.   MRN: [id number]  This visit was conducted in person.  BP 120/68   Pulse 70   Temp 97.7 F (36.5 C) (Temporal)   Ht '5\' 1"'  (1.549 m)   Wt 203 lb 1 oz (92.1 kg)   SpO2 98%   BMI 38.37 kg/m    CC: 6 mo f/u visit  Subjective:   HPI: Alexandria Leach is a 70 y.o. female presenting on 07/11/2020 for Follow-up (Here for 6 mo f/u.)   Known chronic HFpEF with mitral stenosis/regurg s/p repair rec SBE ppx. Saw cards who referred her for evaluation for OSA - pending home sleep study. Rpt echo planned 10/2020 for MS/MR/AS/AI. Saw cards 05/2020 - new onset atrial flutter started on eliquis 20m bid. Saw in f/u last week, planned cardioversion 07/18/2020 as she remains in atrial flutter. Recently started lasix 459mdaily due to progressive pedal edema in setting of recent atrial flutter.   Sjogren's with keratoconjunctivitis sicca (ANA+, Ro+) and FM followed by rheum - now off plaquenil. Saw rheum yesterday - fluctuating kidney function.   Off welbutrin for the past year. Continues lexapro 2071mid. Recent A1c through health insurance home visit 6.3%     Relevant past medical, surgical, family and social history reviewed and updated as indicated. Interim medical history since our last visit reviewed. Allergies and medications reviewed and updated. Outpatient Medications Prior to Visit  Medication Sig Dispense Refill  . ALPRAZolam (XANAX) 0.5 MG tablet Take 1 tablet (0.5 mg total) by mouth 2 (two) times daily. 180 tablet 2  . amitriptyline (ELAVIL) 25 MG tablet Take 1 tablet (25 mg total) by mouth at bedtime. 90 tablet 2  . antiseptic oral rinse (BIOTENE) LIQD 15 mLs by Mouth Rinse route at bedtime.    . aMarland Kitchenixaban (ELIQUIS) 5 MG TABS tablet Take 1 tablet (5 mg total) by mouth 2 (two) times daily. 60 tablet 6  . cholecalciferol (VITAMIN D) 1000 UNITS tablet Take 1,000 Units by mouth daily.    . CMarland Kitchen-Enzyme Q-10 100 MG CAPS  Take 1 capsule (100 mg total) by mouth daily.  0  . diclofenac sodium (VOLTAREN) 1 % GEL Apply 1 application topically 2 (two) times daily as needed (pain).    . dMarland Kitchenltiazem (CARDIZEM CD) 360 MG 24 hr capsule Take 1 capsule (360 mg total) by mouth daily. 90 capsule 3  . docusate sodium (COLACE) 100 MG capsule Take 1 capsule (100 mg total) by mouth daily.    . eMarland Kitchencitalopram (LEXAPRO) 20 MG tablet Take 1 tablet (20 mg total) by mouth 2 (two) times daily. 180 tablet 2  . ezetimibe (ZETIA) 10 MG tablet Take 1 tablet (10 mg total) by mouth at bedtime. 90 tablet 3  . furosemide (LASIX) 20 MG tablet TAKE 1 TABLET BY MOUTH  DAILY AS NEEDED FOR EDEMA 90 tablet 3  . gabapentin (NEURONTIN) 300 MG capsule Take 2 capsules every morning, 1 capsule every afternoon and 2 capsules at bedtime 450 capsule 3  . losartan (COZAAR) 50 MG tablet Take 1 tablet (50 mg total) by mouth daily. 90 tablet 3  . lovastatin (MEVACOR) 10 MG tablet TAKE 1 TABLET BY MOUTH  EVERY MONDAY, WEDNESDAY,  AND FRIDAY 39 tablet 3  . methocarbamol (ROBAXIN) 500 MG tablet TAKE 1 TABLET (500 MG TOTAL) BY MOUTH 2 (TWO) TIMES DAILY AS NEEDED FOR MUSCLE SPASMS. 14 tablet 0  . pantoprazole (PROTONIX) 40 MG tablet     .  Polyethyl Glycol-Propyl Glycol (SYSTANE) 0.4-0.3 % GEL ophthalmic gel Place 1 drop into both eyes 2 (two) times daily.     . polyethylene glycol powder (GLYCOLAX/MIRALAX) 17 GM/SCOOP powder Take 8.5-17 g by mouth daily as needed for moderate constipation. (1/2-1 capful daily as needed) 3350 g 1  . RESTASIS 0.05 % ophthalmic emulsion INSTILL ONE DROP IN BOTH  EYES TWO TIMES DAILY 180 each 0  . traMADol (ULTRAM) 50 MG tablet Take 1 tablet (50 mg total) by mouth daily as needed. 30 tablet 0  . buPROPion (WELLBUTRIN XL) 300 MG 24 hr tablet     . diltiazem (CARDIZEM CD) 120 MG 24 hr capsule Take 1 capsule (120 mg total) by mouth daily. 90 capsule 3  . metoprolol tartrate (LOPRESSOR) 50 MG tablet Take 1.5 tablets (75 mg total) by mouth 2 (two)  times daily. 270 tablet 3   No facility-administered medications prior to visit.     Per HPI unless specifically indicated in ROS section below Review of Systems Objective:  BP 120/68   Pulse 70   Temp 97.7 F (36.5 C) (Temporal)   Ht '5\' 1"'  (1.549 m)   Wt 203 lb 1 oz (92.1 kg)   SpO2 98%   BMI 38.37 kg/m   Wt Readings from Last 3 Encounters:  07/11/20 203 lb 1 oz (92.1 kg)  07/10/20 202 lb (91.6 kg)  07/06/20 199 lb 3.2 oz (90.4 kg)      Physical Exam Vitals and nursing note reviewed.  Constitutional:      Appearance: Normal appearance. She is obese. She is not ill-appearing.  Neck:     Thyroid: Thyroid mass (right sided) and thyromegaly present. No thyroid tenderness.  Cardiovascular:     Rate and Rhythm: Normal rate and regular rhythm.     Pulses: Normal pulses.     Heart sounds: Murmur (3/6 systolic USB) heard.    Pulmonary:     Effort: Pulmonary effort is normal. No respiratory distress.     Breath sounds: Normal breath sounds. No wheezing, rhonchi or rales.  Musculoskeletal:     Right lower leg: Edema (tr-1+) present.     Left lower leg: Edema (tr-1+) present.  Skin:    General: Skin is warm and dry.     Findings: No rash.  Neurological:     Mental Status: She is alert.  Psychiatric:        Mood and Affect: Mood normal.        Behavior: Behavior normal.       Results for orders placed or performed in visit on 05/22/20  Lipid panel  Result Value Ref Range   Cholesterol, Total 167 100 - 199 mg/dL   Triglycerides 75 0 - 149 mg/dL   HDL 71 >39 mg/dL   VLDL Cholesterol Cal 14 5 - 40 mg/dL   LDL Chol Calc (NIH) 82 0 - 99 mg/dL   Chol/HDL Ratio 2.4 0.0 - 4.4 ratio  Comprehensive Metabolic Panel (CMET)  Result Value Ref Range   Glucose 105 (H) 65 - 99 mg/dL   BUN 10 8 - 27 mg/dL   Creatinine, Ser 1.33 (H) 0.57 - 1.00 mg/dL   eGFR 44 (L) >59 mL/min/1.73   BUN/Creatinine Ratio 8 (L) 12 - 28   Sodium 139 134 - 144 mmol/L   Potassium 4.6 3.5 - 5.2 mmol/L    Chloride 105 96 - 106 mmol/L   CO2 21 20 - 29 mmol/L   Calcium 9.7 8.7 - 10.3 mg/dL  Total Protein 7.2 6.0 - 8.5 g/dL   Albumin 4.0 3.8 - 4.8 g/dL   Globulin, Total 3.2 1.5 - 4.5 g/dL   Albumin/Globulin Ratio 1.3 1.2 - 2.2   Bilirubin Total 0.5 0.0 - 1.2 mg/dL   Alkaline Phosphatase 78 44 - 121 IU/L   AST 23 0 - 40 IU/L   ALT 17 0 - 32 IU/L   Assessment & Plan:  This visit occurred during the SARS-CoV-2 public health emergency.  Safety protocols were in place, including screening questions prior to the visit, additional usage of staff PPE, and extensive cleaning of exam room while observing appropriate contact time as indicated for disinfecting solutions.   Problem List Items Addressed This Visit    MDD (major depressive disorder), recurrent episode ([place])    Stable period only on lexapro 58m BID, now off wellbutrin and klonopin. Followed by psychiatry       History of mitral valve repair   (HFpEF) heart failure with preserved ejection fraction (HCC)    H/o diastolic CHF.  Now on lasix 417mdaily, not on potassium.  Update renal panel      [place]   Appreciate rheum care.  Currently off DMARD.      CKD (chronic kidney disease) stage 3, GFR 30-59 ml/min (HCC) - Primary    Update renal panel and PTH.  If progression noted, low threshold to re-refer to nephrology.  Previously saw Dr DeHarden [id number] - rec serial kidney function monitoring, f/u PRN.       Relevant Orders   Renal function panel   Parathyroid hormone, intact (no Ca)   Fibromyalgia   Osteopenia    Discussed will be due for updated DEXA 09/2020       [facility]   Update renal panel and PTH.       Atrial flutter ([place]   Now on eliquis.  Pending cardioversion next week.  Appreciate cards care.       Prediabetes    Update A1c - apparently recent insurance home test was elevated.       Relevant Orders   Hemoglobin A1c       No orders of  the defined types were placed in this encounter.  Orders Placed This Encounter  Procedures  . Renal function panel  . Hemoglobin A1c  . Parathyroid hormone, intact (no Ca)    Patient Instructions  You will be due for bone density scan after 7/1. Let usKoreanow closer to the date to order this.  Labs today.  We will discuss kidney doctor pending results.  Return as needed or in 6 months for wellness visit with health advisor and physical with me.    Follow up plan: Return in about 6 months (around 01/10/2021) for annual exam, prior fasting for blood work, medicare wellness visit.  JaRia BushMD

## 2020-07-11 NOTE — Assessment & Plan Note (Addendum)
Stable period only on lexapro 20mg  BID, now off wellbutrin and klonopin. Followed by psychiatry

## 2020-07-11 NOTE — Assessment & Plan Note (Addendum)
Update renal panel and PTH.  If progression noted, low threshold to re-refer to nephrology.  Previously saw Dr Harden Mo 2015 - rec serial kidney function monitoring, f/u PRN.

## 2020-07-11 NOTE — Assessment & Plan Note (Signed)
Discussed will be due for updated DEXA 09/2020

## 2020-07-11 NOTE — Assessment & Plan Note (Signed)
Update renal panel and PTH.

## 2020-07-11 NOTE — Assessment & Plan Note (Signed)
Update A1c - apparently recent insurance home test was elevated.

## 2020-07-11 NOTE — Assessment & Plan Note (Signed)
Now on eliquis.  Pending cardioversion next week.  Appreciate cards care.

## 2020-07-11 NOTE — Patient Instructions (Addendum)
You will be due for bone density scan after 7/1. Let us know closer to the date to order this.  Labs today.  We will discuss kidney doctor pending results.  Return as needed or in 6 months for wellness visit with health advisor and physical with me.

## 2020-07-11 NOTE — Assessment & Plan Note (Addendum)
Appreciate rheum care.  Currently off DMARD.

## 2020-07-11 NOTE — Assessment & Plan Note (Addendum)
H/o diastolic CHF.  Now on lasix 40mg  daily, not on potassium.  Update renal panel

## 2020-07-12 LAB — PARATHYROID HORMONE, INTACT (NO CA): PTH: 84 pg/mL — ABNORMAL HIGH (ref 16–77)

## 2020-07-13 ENCOUNTER — Ambulatory Visit: Payer: Medicare Other

## 2020-07-13 ENCOUNTER — Other Ambulatory Visit: Payer: Self-pay

## 2020-07-13 DIAGNOSIS — R0683 Snoring: Secondary | ICD-10-CM

## 2020-07-13 DIAGNOSIS — G4710 Hypersomnia, unspecified: Secondary | ICD-10-CM | POA: Diagnosis not present

## 2020-07-14 ENCOUNTER — Other Ambulatory Visit (HOSPITAL_COMMUNITY)
Admission: RE | Admit: 2020-07-14 | Discharge: 2020-07-14 | Disposition: A | Discharge status: Home / Self Care | Payer: Medicare Other | Source: Ambulatory Visit | Attending: Internal Medicine | Admitting: Internal Medicine

## 2020-07-14 ENCOUNTER — Telehealth: Payer: Self-pay | Admitting: Pulmonary Disease

## 2020-07-14 DIAGNOSIS — G4710 Hypersomnia, unspecified: Secondary | ICD-10-CM | POA: Diagnosis not present

## 2020-07-14 DIAGNOSIS — Z01812 Encounter for preprocedural laboratory examination: Secondary | ICD-10-CM | POA: Diagnosis not present

## 2020-07-14 DIAGNOSIS — Z20822 Contact with and (suspected) exposure to covid-19: Secondary | ICD-10-CM | POA: Insufficient documentation

## 2020-07-14 NOTE — Telephone Encounter (Signed)
HST >> NO sig sleep disordered breathing

## 2020-07-15 LAB — SARS CORONAVIRUS 2 (TAT 6-24 HRS): SARS Coronavirus 2: NEGATIVE

## 2020-07-18 ENCOUNTER — Ambulatory Visit (HOSPITAL_COMMUNITY)
Admission: RE | Admit: 2020-07-18 | Discharge: 2020-07-18 | Disposition: A | Discharge status: Home / Self Care | Payer: Medicare Other | Attending: Internal Medicine | Admitting: Internal Medicine

## 2020-07-18 ENCOUNTER — Other Ambulatory Visit: Payer: Self-pay

## 2020-07-18 ENCOUNTER — Ambulatory Visit (HOSPITAL_COMMUNITY): Payer: Medicare Other | Admitting: Anesthesiology

## 2020-07-18 ENCOUNTER — Encounter (HOSPITAL_COMMUNITY): Payer: Self-pay | Admitting: Internal Medicine

## 2020-07-18 ENCOUNTER — Encounter (HOSPITAL_COMMUNITY)
Admission: RE | Disposition: A | Discharge status: Home / Self Care | Payer: Self-pay | Source: Home / Self Care | Attending: Internal Medicine

## 2020-07-18 DIAGNOSIS — Z79899 Other long term (current) drug therapy: Secondary | ICD-10-CM | POA: Insufficient documentation

## 2020-07-18 DIAGNOSIS — R002 Palpitations: Secondary | ICD-10-CM | POA: Insufficient documentation

## 2020-07-18 DIAGNOSIS — I483 Typical atrial flutter: Secondary | ICD-10-CM

## 2020-07-18 DIAGNOSIS — I080 Rheumatic disorders of both mitral and aortic valves: Secondary | ICD-10-CM | POA: Diagnosis not present

## 2020-07-18 DIAGNOSIS — R0683 Snoring: Secondary | ICD-10-CM | POA: Insufficient documentation

## 2020-07-18 DIAGNOSIS — Z7901 Long term (current) use of anticoagulants: Secondary | ICD-10-CM | POA: Diagnosis not present

## 2020-07-18 DIAGNOSIS — I130 Hypertensive heart and chronic kidney disease with heart failure and stage 1 through stage 4 chronic kidney disease, or unspecified chronic kidney disease: Secondary | ICD-10-CM | POA: Insufficient documentation

## 2020-07-18 DIAGNOSIS — E785 Hyperlipidemia, unspecified: Secondary | ICD-10-CM | POA: Insufficient documentation

## 2020-07-18 DIAGNOSIS — N183 Chronic kidney disease, stage 3 unspecified: Secondary | ICD-10-CM | POA: Diagnosis not present

## 2020-07-18 DIAGNOSIS — R7303 Prediabetes: Secondary | ICD-10-CM | POA: Diagnosis not present

## 2020-07-18 DIAGNOSIS — E7800 Pure hypercholesterolemia, unspecified: Secondary | ICD-10-CM | POA: Diagnosis not present

## 2020-07-18 DIAGNOSIS — I4892 Unspecified atrial flutter: Secondary | ICD-10-CM | POA: Diagnosis not present

## 2020-07-18 DIAGNOSIS — I5032 Chronic diastolic (congestive) heart failure: Secondary | ICD-10-CM | POA: Insufficient documentation

## 2020-07-18 HISTORY — PX: CARDIOVERSION: SHX1299

## 2020-07-18 LAB — POCT I-STAT, CHEM 8
BUN: 12 mg/dL (ref 8–23)
Calcium, Ion: 1.13 mmol/L — ABNORMAL LOW (ref 1.15–1.40)
Chloride: 110 mmol/L (ref 98–111)
Creatinine, Ser: 1.3 mg/dL — ABNORMAL HIGH (ref 0.44–1.00)
Glucose, Bld: 96 mg/dL (ref 70–99)
HCT: 37 % (ref 36.0–46.0)
Hemoglobin: 12.6 g/dL (ref 12.0–15.0)
Potassium: 4.5 mmol/L (ref 3.5–5.1)
Sodium: 139 mmol/L (ref 135–145)
TCO2: 23 mmol/L (ref 22–32)

## 2020-07-18 SURGERY — CARDIOVERSION
Anesthesia: General

## 2020-07-18 MED ORDER — SODIUM CHLORIDE 0.9 % IV SOLN: Freq: Once | INTRAVENOUS | Status: AC

## 2020-07-18 MED ORDER — PROPOFOL 10 MG/ML IV BOLUS
INTRAVENOUS | Status: DC | PRN
  Administered 2020-07-18: 70 mg via INTRAVENOUS

## 2020-07-18 MED ORDER — LACTATED RINGERS IV SOLN: INTRAVENOUS | Status: DC | PRN

## 2020-07-18 NOTE — Transfer of Care (Signed)
Immediate Anesthesia Transfer of Care Note  Patient: Alexandria Leach  Procedure(s) Performed: CARDIOVERSION (N/A )  Patient Location: Endoscopy Unit  Anesthesia Type:General  Level of Consciousness: drowsy and patient cooperative  Airway & Oxygen Therapy: Patient Spontanous Breathing  Post-op Assessment: Report given to RN and Post -op Vital signs reviewed and stable  Post vital signs: Reviewed and stable  Last Vitals:  Vitals Value Taken Time  BP 137/60 07/18/20 1256  Temp    Pulse 62 07/18/20 1258  Resp 20 07/18/20 1258  SpO2 97 % 07/18/20 1258    Last Pain:  Vitals:   07/18/20 1137  TempSrc: Temporal  PainSc: 0-No pain         Complications: No complications documented.

## 2020-07-18 NOTE — Anesthesia Preprocedure Evaluation (Addendum)
Anesthesia Evaluation  Patient identified by MRN, date of birth, ID band Patient awake    Reviewed: Allergy & Precautions, NPO status , Patient's Chart, lab work & pertinent test results  Airway Mallampati: II  TM Distance: >3 FB Neck ROM: Full    Dental  (+) Partial Upper   Pulmonary    breath sounds clear to auscultation       Cardiovascular hypertension, Pt. on medications and Pt. on home beta blockers +CHF   Rhythm:Irregular Rate:Normal     Neuro/Psych PSYCHIATRIC DISORDERS Anxiety Depression TIA   GI/Hepatic Neg liver ROS, GERD  Medicated,  Endo/Other  negative endocrine ROS  Renal/GU Renal disease     Musculoskeletal  (+) Arthritis , Fibromyalgia -  Abdominal Normal abdominal exam  (+)   Peds  Hematology negative hematology ROS (+)   Anesthesia Other Findings   Reproductive/Obstetrics                            Anesthesia Physical Anesthesia Plan  ASA: III  Anesthesia Plan: General   Post-op Pain Management:    Induction: Intravenous  PONV Risk Score and Plan: 0 and Propofol infusion  Airway Management Planned: Natural Airway and Simple Face Mask  Additional Equipment: None  Intra-op Plan:   Post-operative Plan:   Informed Consent: I have reviewed the patients History and Physical, chart, labs and discussed the procedure including the risks, benefits and alternatives for the proposed anesthesia with the patient or authorized representative who has indicated his/her understanding and acceptance.       Plan Discussed with: CRNA  Anesthesia Plan Comments: (Echo:  1. Left ventricular ejection fraction, by estimation, is 60 to 65%. The  left ventricle has normal function. The left ventricle has no regional  wall motion abnormalities. There is mild asymmetric left ventricular  hypertrophy of the basal-septal segment.  Left ventricular diastolic parameters are  consistent with Grade II  diastolic dysfunction (pseudonormalization). Elevated left ventricular  end-diastolic pressure.  2. Right ventricular systolic function is normal. The right ventricular  size is normal. There is moderately elevated pulmonary artery systolic  pressure.  3. Left atrial size was severely dilated.  4. Heart rate 78 bpm. Compared with the echo 10/2018, mean gradient has  increased from 8 mmHg to 10 mmHg. Pressure half time is unchanged. Valve  area by pressure half time (102 ms) is 2.16 cm^2. The mitral valve is  rheumatic. Moderate mitral valve  regurgitation. Moderate mitral stenosis. The mean mitral valve gradient is  10.0 mmHg.  5. The aortic valve is tricuspid. Aortic valve regurgitation is mild.  Mild aortic valve stenosis. Aortic valve area, by VTI measures 1.10 cm.  Aortic valve mean gradient measures 11.0 mmHg. Aortic valve Vmax measures  2.23 m/s.  6. The inferior vena cava is normal in size with greater than 50%  respiratory variability, suggesting right atrial pressure of 3 mmHg. )       Anesthesia Quick Evaluation

## 2020-07-18 NOTE — Anesthesia Postprocedure Evaluation (Signed)
Anesthesia Post Note  Patient: Alexandria Leach  Procedure(s) Performed: CARDIOVERSION (N/A )     Patient location during evaluation: Endoscopy Anesthesia Type: General Level of consciousness: awake and alert Pain management: pain level controlled Vital Signs Assessment: post-procedure vital signs reviewed and stable Respiratory status: spontaneous breathing, nonlabored ventilation, respiratory function stable and patient connected to nasal cannula oxygen Cardiovascular status: blood pressure returned to baseline and stable Postop Assessment: no apparent nausea or vomiting Anesthetic complications: no   No complications documented.  Last Vitals:  Vitals:   07/18/20 1308 07/18/20 1318  BP: (!) 143/59 (!) 142/63  Pulse: 63 64  Resp: 18 17  Temp:    SpO2: 99% 99%    Last Pain:  Vitals:   07/18/20 1318  TempSrc:   PainSc: 0-No pain                 Maclin Guerrette COKER

## 2020-07-18 NOTE — Interval H&P Note (Signed)
History and Physical Interval Note:  07/18/2020 12:34 PM  Alexandria Leach  has presented today for surgery, with the diagnosis of afib.  The various methods of treatment have been discussed with the patient and family. After consideration of risks, benefits and other options for treatment, the patient has consented to  Procedure(s): CARDIOVERSION (N/A) as a surgical intervention.  The patient's history has been reviewed, patient examined, no change in status, stable for surgery.  I have reviewed the patient's chart and labs.  Questions were answered to the patient's satisfaction.     Elouise Munroe

## 2020-07-18 NOTE — Discharge Instructions (Signed)

## 2020-07-18 NOTE — CV Procedure (Signed)
Procedure: Electrical Cardioversion Indications:  Atrial Flutter  Procedure Details:  Consent: Risks of procedure as well as the alternatives and risks of each were explained to the (patient/caregiver).  Consent for procedure obtained.  Time Out: Verified patient identification, verified procedure, site/side was marked, verified correct patient position, special equipment/implants available, medications/allergies/relevent history reviewed, required imaging and test results available. PERFORMED.  Patient placed on cardiac monitor, pulse oximetry, supplemental oxygen as necessary.  Sedation given: propofol per anesthesia Pacer pads placed anterior and posterior chest.  Cardioverted 1 time(s).  Cardioversion with synchronized biphasic 120J shock.  Evaluation: Findings: Post procedure EKG shows: NSR Complications: None Patient did tolerate procedure well.  Time Spent Directly with the Patient:  30 minutes   [place] 07/18/2020, 1:15 PM

## 2020-07-19 ENCOUNTER — Encounter (HOSPITAL_COMMUNITY): Payer: Self-pay | Admitting: Internal Medicine

## 2020-07-19 ENCOUNTER — Telehealth: Payer: Self-pay | Admitting: Pulmonary Disease

## 2020-07-19 NOTE — Telephone Encounter (Signed)
HST >> NO sig sleep disordered breathing  I have called and LM on VM for pt to call back for results.

## 2020-07-19 NOTE — Telephone Encounter (Signed)
Called and left message on voicemail to please return phone call to go over HST results. Contact number provided.  

## 2020-07-21 NOTE — Telephone Encounter (Signed)
Called and spoke with Patient.  Dr. Bari Mantis HST results given.  Understanding stated.  Nothing further at this time.

## 2020-07-21 NOTE — Telephone Encounter (Signed)
Called and spoke with Patient.  Dr. Alva's HST results given.  Understanding stated.  Nothing further at this time. 

## 2020-08-02 DIAGNOSIS — H10413 Chronic giant papillary conjunctivitis, bilateral: Secondary | ICD-10-CM | POA: Diagnosis not present

## 2020-08-02 DIAGNOSIS — H2513 Age-related nuclear cataract, bilateral: Secondary | ICD-10-CM | POA: Diagnosis not present

## 2020-08-02 DIAGNOSIS — H524 Presbyopia: Secondary | ICD-10-CM | POA: Diagnosis not present

## 2020-08-02 DIAGNOSIS — H16223 Keratoconjunctivitis sicca, not specified as Sjogren's, bilateral: Secondary | ICD-10-CM | POA: Diagnosis not present

## 2020-08-16 ENCOUNTER — Other Ambulatory Visit: Payer: Self-pay

## 2020-08-16 ENCOUNTER — Encounter (HOSPITAL_COMMUNITY): Payer: Self-pay | Admitting: Internal Medicine

## 2020-08-16 ENCOUNTER — Telehealth (HOSPITAL_COMMUNITY): Payer: Medicare Other | Admitting: Psychiatry

## 2020-08-16 NOTE — Addendum Note (Signed)
Addendum  created 08/16/20 0944 by Roberts Gaudy, MD   Intraprocedure Event edited, Intraprocedure Staff edited

## 2020-08-18 ENCOUNTER — Other Ambulatory Visit: Payer: Self-pay

## 2020-08-18 ENCOUNTER — Encounter (HOSPITAL_COMMUNITY): Payer: Self-pay | Admitting: Psychiatry

## 2020-08-18 ENCOUNTER — Telehealth (INDEPENDENT_AMBULATORY_CARE_PROVIDER_SITE_OTHER): Payer: Medicare Other | Admitting: Psychiatry

## 2020-08-18 DIAGNOSIS — F331 Major depressive disorder, recurrent, moderate: Secondary | ICD-10-CM | POA: Diagnosis not present

## 2020-08-18 MED ORDER — AMITRIPTYLINE HCL 50 MG PO TABS: 50.0000 mg | ORAL_TABLET | Freq: Every day | ORAL | 2 refills | Status: DC

## 2020-08-18 MED ORDER — ALPRAZOLAM 0.5 MG PO TABS: 0.5000 mg | ORAL_TABLET | Freq: Two times a day (BID) | ORAL | 2 refills | Status: DC

## 2020-08-18 MED ORDER — ESCITALOPRAM OXALATE 20 MG PO TABS: 20.0000 mg | ORAL_TABLET | Freq: Two times a day (BID) | ORAL | 2 refills | Status: DC

## 2020-08-18 NOTE — Progress Notes (Signed)
Virtual Visit via Telephone Note  I connected with Alexandria Leach on 08/18/20 at  9:40 AM EDT by telephone and verified that I am speaking with the correct person using two identifiers.  Location: Patient: home Provider: home office   I discussed the limitations, risks, security and privacy concerns of performing an evaluation and management service by telephone and the availability of in person appointments. I also discussed with the patient that there may be a patient responsible charge related to this service. The patient expressed understanding and agreed to proceed.    I discussed the assessment and treatment plan with the patient. The patient was provided an opportunity to ask questions and all were answered. The patient agreed with the plan and demonstrated an understanding of the instructions.   The patient was advised to call back or seek an in-person evaluation if the symptoms worsen or if the condition fails to improve as anticipated.  I provided 15 minutes of non-face-to-face time during this encounter.   [name], MD  [facility] MD/PA/NP OP Progress Note  08/18/2020 10:15 AM Alexandria Leach  MRN:  [id number]  Chief Complaint:  Chief Complaint    Anxiety; Depression; Follow-up     HPI: This patient is a 70 year old separated black female who lives alone in [place]. 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 She has 4 sons.  The patient returns for follow-up after 3 months.  For the most part she is doing okay.  She did undergo the cardioversion earlier this month and it seems to have helped.  Her pulse rate has come down to a normal level.  Overall she is feeling pretty good except for the significant dryness from Sjogren's syndrome.  She is still having some arthritic pain.  However she is getting out more and spending time with friends and also helping out the elderly from her church.  She  states that her mood has been good.  She is having more trouble getting to sleep side agree to increase her amitriptyline.  I did warn her that sometimes this can make the dryness worse and she will let me know if it does Visit Diagnosis:    ICD-10-CM   1. Major depressive disorder, recurrent episode, moderate (HCC)  F33.1     Past Psychiatric History: none  Past Medical History:  Past Medical History:  Diagnosis Date  . Ankle fracture, left 02/19/2015   from a fall  . Anxiety   . Cataract 2014   corrected with surgery  . CHF (congestive heart failure) ([place])   . CKD (chronic kidney disease) stage 3, GFR 30-59 ml/min [facility])    saw nephrologist Dr Harden Mo  . DDD (degenerative disc disease), cervical   . Depression   . Fibromyalgia   . Glaucoma    s/p surgery, sees ophtho Q6 mo  . 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 History of kidney stones 2010  . History of pulmonary embolism 2001, 2006   completed coumadin courses  . History of rheumatic fever x3  . HLD (hyperlipidemia)   . HTN (hypertension)   . Insomnia   . Lung nodule 09/22/2012   RLL - 74mm, stable since 2014. Thought benign.   . Osteoarthritis    shoulders and knees, not RA per Dr Estanislado Pandy, positive ANA, positive Ro  . Osteopenia 09/18/2015   DEXA T -1.1 hip, -  0.2 spine 08/2015   . Personal history of urinary calculi latest 2014  . Pneumonia 12/02/2011  . PONV (postoperative nausea and vomiting)   . Refusal of blood transfusions as patient is Jehovah's Witness   . Rheumatic heart disease 1980   s/p mitral valve repair 1980  . Sjogren's syndrome ([place])   . Trimalleolar fracture of left ankle 02/23/2015    Past Surgical History:  Procedure Laterality Date  . BREAST BIOPSY Right 2006   benign  . CARDIOVERSION N/A 07/18/2020   Procedure: CARDIOVERSION;  Surgeon: Elouise Munroe, MD;  [facility] END[facility]COPY;  Service: Cardiovascular;  Laterality: N/A;   . CHOLECYSTECTOMY  11/27/2011   Procedure: LAPAROSCOPIC CHOLECYSTECTOMY WITH INTRAOPERATIVE CHOLANGIOGRAM;  Surgeon: Adin Hector, MD;  Location: [place];  Service: General;  Laterality: N/A;  laparoscopic cholecystectomy with choleangiogram umbilical hernia repair  . COLONOSCOPY  07/2014   WNL Amedeo Plenty)  . COLONOSCOPY  08/2019   3 TAs, rpt 3 yrs ([place])  . dexa  08/2012   normal per patient - no records available  . ESOPHAGOGASTRODUODENOSCOPY  08/2019   reactive gastropathy, neg H pylori ([place])  . EYE SURGERY Bilateral 2014   cataract removal  . MITRAL VALVE REPAIR  1980   open heart  . ORIF ANKLE FRACTURE Left 02/26/2015   Procedure: OPEN REDUCTION INTERNAL FIXATION (ORIF) LEFT TRIMALLEOLAR ANKLE FRACTURE;  Surgeon: [name], MD;  Location: [place];  Service: Orthopedics;  Laterality: Left;  . TUBAL LIGATION  1980  . UMBILICAL HERNIA REPAIR  11/27/2011   Procedure: HERNIA REPAIR UMBILICAL ADULT;  Surgeon: Adin Hector, MD;  Location: [place];  Service: General;  Laterality: N/A;  . [place]   for fibroids -- partial, ovaries remain    Family Psychiatric History: see below  Family History:  Family History  Problem Relation Age of Onset  . Lupus Sister        and niece  . Cancer Mother        lung (nonsmoker)  . CAD Mother        MI in her 74s  . ALS Mother   . Kidney disease Father   . Alcohol abuse Father   . Diabetes Father   . Cancer Brother        bone  . Diabetes Sister   . Stroke Sister   . Cancer Maternal Uncle        bone  . Depression Sister   . Lupus Sister   . Kidney failure Other        on HD  . Diabetes Brother   . Heart attack Brother   . Stroke Maternal Grandmother   . Blindness Sister   . Healthy Son   . Healthy Son   . Healthy Son   . Healthy Son     Social History:  Social History   Socioeconomic History  . Marital status: Married    Spouse name: [name]  . Number of children: 4  . Years of education: 84   . Highest education level: Not on file  Occupational History    Employer: UNEMPLOYED    Comment: Disability  Tobacco Use  . Smoking status: Never Smoker  . Smokeless tobacco: Never Used  Vaping Use  . Vaping Use: Never used  Substance and Sexual Activity  . Alcohol use: No    Alcohol/week: 0.0 standard drinks  . Drug use: No  . Sexual activity: Not Currently    Birth control/protection: Surgical  Other Topics Concern  . Not on file  Social History Narrative   Lives with son, 1 dog   Occupation: unemployed, on disability for fibromyalgia since 2008.   Edu: HS   Religion: Jehova's witness   Activity: volunteers at senior center   Diet: some water, fruits/vegetables daily   No caffeine use   Social Determinants of Health   Financial Resource Strain: Not on file  Food Insecurity: Not on file  Transportation Needs: Not on file  Physical Activity: Not on file  Stress: Not on file  Social Connections: Not on file    Allergies:  Allergies  Allergen Reactions  . Cymbalta [Duloxetine Hcl] Other (See Comments)    tachycardia  . Statins Nausea Only and Other (See Comments)    Muscle cramps also  . Sulfa Antibiotics Nausea And Vomiting    Metabolic Disorder Labs: Lab Results  Component Value Date   HGBA1C 6.5 07/11/2020   MPG 114 10/13/2015   No results found for: PROLACTIN Lab Results  Component Value Date   CHOL 167 05/29/2020   TRIG 75 05/29/2020   HDL 71 05/29/2020   CHOLHDL 2.4 05/29/2020   VLDL 14.4 12/23/2019   LDLCALC 82 05/29/2020   LDLCALC 86 12/23/2019   Lab Results  Component Value Date   TSH 4.12 12/23/2019   TSH 1.91 10/06/2018    Therapeutic Level Labs: No results found for: LITHIUM No results found for: VALPROATE No components found for:  CBMZ  Current Medications: Current Outpatient Medications  Medication Sig Dispense Refill  . amitriptyline (ELAVIL) 50 MG tablet Take 1 tablet (50 mg total) by mouth at bedtime. 90 tablet 2  .  ALPRAZolam (XANAX) 0.5 MG tablet Take 1 tablet (0.5 mg total) by mouth 2 (two) times daily. 180 tablet 2  . antiseptic oral rinse (BIOTENE) LIQD 15 mLs by Mouth Rinse route at bedtime.    Marland Kitchen apixaban (ELIQUIS) 5 MG TABS tablet Take 1 tablet (5 mg total) by mouth 2 (two) times daily. 60 tablet 6  . cholecalciferol (VITAMIN D) 1000 UNITS tablet Take 1,000 Units by mouth daily.    Marland Kitchen Co-Enzyme Q-10 100 MG CAPS Take 1 capsule (100 mg total) by mouth daily.  0  . diclofenac sodium (VOLTAREN) 1 % GEL Apply 1 application topically 2 (two) times daily as needed (pain).    Marland Kitchen diltiazem (CARDIZEM CD) 360 MG 24 hr capsule Take 1 capsule (360 mg total) by mouth daily. 90 capsule 3  . docusate sodium (COLACE) 100 MG capsule Take 1 capsule (100 mg total) by mouth daily.    Marland Kitchen escitalopram (LEXAPRO) 20 MG tablet Take 1 tablet (20 mg total) by mouth 2 (two) times daily. 180 tablet 2  . ezetimibe (ZETIA) 10 MG tablet Take 1 tablet (10 mg total) by mouth at bedtime. 90 tablet 3  . furosemide (LASIX) 20 MG tablet TAKE 1 TABLET BY MOUTH  DAILY AS NEEDED FOR EDEMA (Patient taking differently: Take 40 mg by mouth daily as needed for edema.) 90 tablet 3  . gabapentin (NEURONTIN) 300 MG capsule Take 2 capsules every morning, 1 capsule every afternoon and 2 capsules at bedtime (Patient taking differently: Take 300-600 mg by mouth See admin instructions. Take 2 capsules every morning, 1 capsule every afternoon and 2 capsules at bedtime) 450 capsule 3  . losartan (COZAAR) 50 MG tablet Take 1 tablet (50 mg total) by mouth daily. 90 tablet 3  . lovastatin (MEVACOR) 10 MG tablet TAKE 1 TABLET BY MOUTH  EVERY MONDAY, WEDNESDAY,  AND FRIDAY (Patient taking differently: Take 10 mg by mouth every Monday, Wednesday, and Friday. TAKE 1 TABLET BY MOUTH  EVERY MONDAY, WEDNESDAY,  AND FRIDAY) 39 tablet 3  . metoprolol tartrate (LOPRESSOR) 50 MG tablet Take 75 mg by mouth 2 (two) times daily.    . pantoprazole (PROTONIX) 40 MG tablet Take 40 mg  by mouth daily.    Vladimir Faster Glycol-Propyl Glycol (SYSTANE) 0.4-0.3 % GEL ophthalmic gel Place 1 drop into both eyes 2 (two) times daily.     . polyethylene glycol powder (GLYCOLAX/MIRALAX) 17 GM/SCOOP powder Take 8.5-17 g by mouth daily as needed for moderate constipation. (1/2-1 capful daily as needed) 3350 g 1  . RESTASIS 0.05 % ophthalmic emulsion INSTILL ONE DROP IN BOTH  EYES TWO TIMES DAILY (Patient taking differently: Place 1 drop into both eyes 2 (two) times daily.) 180 each 0  . traMADol (ULTRAM) 50 MG tablet Take 1 tablet (50 mg total) by mouth daily as needed. (Patient taking differently: Take 50 mg by mouth daily as needed for severe pain.) 30 tablet 0   No current facility-administered medications for this visit.     Musculoskeletal: Strength & Muscle Tone: within normal limits Gait & Station: normal Patient leans: N/A  Psychiatric Specialty Exam: Review of Systems  Musculoskeletal: Positive for arthralgias.  Psychiatric/Behavioral: Positive for sleep disturbance.  All other systems reviewed and are negative.   There were no vitals taken for this visit.There is no height or weight on file to calculate BMI.  General Appearance: NA  Eye Contact:  NA  Speech:  Clear and Coherent  Volume:  Normal  Mood:  Euthymic  Affect:  Appropriate and Congruent  Thought Process:  Goal Directed  Orientation:  Full (Time, Place, and Person)  Thought Content: WDL   Suicidal Thoughts:  No  Homicidal Thoughts:  No  Memory:  Immediate;   Good Recent;   Good Remote;   Good  Judgement:  Good  Insight:  Good  Psychomotor Activity:  Decreased  Concentration:  Concentration: Good and Attention Span: Good  Recall:  Good  Fund of Knowledge: Good  Language: Good  Akathisia:  No  Handed:  Right  AIMS (if indicated): not done  Assets:  Communication Skills Desire for Improvement Resilience Social Support Talents/Skills  ADL's:  Intact  Cognition: WNL  Sleep:  Good    Screenings: GAD-7   Flowsheet Row Office Visit from 12/28/2019 in [place] at [place] Visit from 12/22/2018 in [place] at [facility]  Total GAD-7 Score 0 [place] from 12/11/2017 in [place] at [facility] from 12/09/2016 in [place] at [place] from 11/30/2015 in [place] at [facility]  Total Score (max 30 points ) 20 19 18     PHQ2-9   Flowsheet Row Video Visit from 08/18/2020 in [place] [facility] Video Visit from 05/24/2020 in [place] Office Visit from 12/28/2019 in [place] at [place] Visit from 12/22/2018 in [place] at [place] from 12/21/2018 in [place] at [place]  PHQ-2 Total Score 0 0 0 0 1  PHQ-9 Total Score -- -- 1 4 1     Flowsheet Row Video Visit from 08/18/2020 in [place] [facility] Admission (Discharged) from 07/18/2020 in [place] Video Visit from 05/24/2020 in [place]  [facility]  C-SSRS RISK CATEGORY No Risk Error: Q3, 4, or 5 should not be populated when Q2 is No No Risk       Assessment and Plan: This patient is a 70 year old female with a history of depression and anxiety.  She is doing well except for some difficulties with sleep.  We will increase amitriptyline to 50 mg at bedtime for sleep and a chronic pain.  She will continue Lexapro 20 mg twice daily for depression and Xanax 0.5 mg twice daily as needed for anxiety.  She will return to see me in 3 months   [name], MD 08/18/2020, 10:15 AM

## 2020-09-08 ENCOUNTER — Other Ambulatory Visit: Payer: Self-pay | Admitting: *Deleted

## 2020-09-08 MED ORDER — METOPROLOL TARTRATE 50 MG PO TABS: 75.0000 mg | ORAL_TABLET | Freq: Two times a day (BID) | ORAL | 3 refills | Status: DC

## 2020-09-12 ENCOUNTER — Ambulatory Visit (HOSPITAL_COMMUNITY)
Admission: RE | Admit: 2020-09-12 | Discharge: 2020-09-12 | Disposition: A | Discharge status: Home / Self Care | Payer: Medicare Other | Source: Ambulatory Visit | Attending: Nurse Practitioner | Admitting: Nurse Practitioner

## 2020-09-12 ENCOUNTER — Other Ambulatory Visit: Payer: Self-pay

## 2020-09-12 VITALS — BP 134/56 | HR 62 | Ht 61.0 in | Wt 208.6 lb

## 2020-09-12 DIAGNOSIS — I130 Hypertensive heart and chronic kidney disease with heart failure and stage 1 through stage 4 chronic kidney disease, or unspecified chronic kidney disease: Secondary | ICD-10-CM | POA: Diagnosis not present

## 2020-09-12 DIAGNOSIS — I10 Essential (primary) hypertension: Secondary | ICD-10-CM | POA: Insufficient documentation

## 2020-09-12 DIAGNOSIS — Z79899 Other long term (current) drug therapy: Secondary | ICD-10-CM | POA: Insufficient documentation

## 2020-09-12 DIAGNOSIS — Z7901 Long term (current) use of anticoagulants: Secondary | ICD-10-CM | POA: Diagnosis not present

## 2020-09-12 DIAGNOSIS — E785 Hyperlipidemia, unspecified: Secondary | ICD-10-CM | POA: Insufficient documentation

## 2020-09-12 DIAGNOSIS — I509 Heart failure, unspecified: Secondary | ICD-10-CM | POA: Diagnosis not present

## 2020-09-12 DIAGNOSIS — I4892 Unspecified atrial flutter: Secondary | ICD-10-CM | POA: Diagnosis not present

## 2020-09-12 DIAGNOSIS — D6869 Other thrombophilia: Secondary | ICD-10-CM | POA: Diagnosis not present

## 2020-09-12 DIAGNOSIS — I483 Typical atrial flutter: Secondary | ICD-10-CM | POA: Insufficient documentation

## 2020-09-12 DIAGNOSIS — N183 Chronic kidney disease, stage 3 unspecified: Secondary | ICD-10-CM | POA: Insufficient documentation

## 2020-09-12 DIAGNOSIS — Z8249 Family history of ischemic heart disease and other diseases of the circulatory system: Secondary | ICD-10-CM | POA: Diagnosis not present

## 2020-09-12 NOTE — Progress Notes (Signed)
Primary Care Physician: Ria Bush, MD Referring Physician: Dr. Vincent Peyer Alexandria Leach is a 70 y.o. female with a h/o new onset typical atrial flutter in April of this year. H/o of rheumatic heart disease with remote repair pf mitral valve in 1980, CHF, CKD.  She was placed on anticoagulation and had a succession CV but ERAfl. She felt much improved while in SR. She is now in the afib clinic to discuss options to regain SR.  Weight indicates she ius up 6 lbs of fluid and pt started back on her prn lasix yesterday.   Today, she denies symptoms of palpitations, chest pain, shortness of breath, orthopnea, PND, lower extremity edema, dizziness, presyncope, syncope, or neurologic sequela. The patient is tolerating medications without difficulties and is otherwise without complaint today.   Past Medical History:  Diagnosis Date   Ankle fracture, left 02/19/2015   from a fall   Anxiety    Cataract 2014   corrected with surgery   CHF (congestive heart failure) (HCC)    CKD (chronic kidney disease) stage 3, GFR 30-59 ml/min [facility])    saw nephrologist Dr Harden Mo   DDD (degenerative disc disease), cervical    Depression    Fibromyalgia    Glaucoma    s/p surgery, sees ophtho Q6 mo   History of DVT (deep vein thrombosis) several times latest 2012   receives coumadin while hospitalized   History of kidney stones 2010   History of pulmonary embolism 2001, 2006   completed coumadin courses   History of rheumatic fever x3   HLD (hyperlipidemia)    HTN (hypertension)    Insomnia    Lung nodule 09/22/2012   RLL - 23mm, stable since 2014. Thought benign.    Osteoarthritis    shoulders and knees, not RA per Dr Estanislado Pandy, positive ANA, positive Ro   Osteopenia 09/18/2015   DEXA T -1.1 hip, -0.2 spine 08/2015    Personal history of urinary calculi latest 2014   Pneumonia 12/02/2011   PONV (postoperative nausea and vomiting)    Refusal of blood transfusions as patient is Jehovah's  Witness    Rheumatic heart disease 1980   s/p mitral valve repair 1980   Sjogren's syndrome ([place])    Trimalleolar fracture of left ankle 02/23/2015   Past Surgical History:  Procedure Laterality Date   BREAST BIOPSY Right 2006   benign   CARDIOVERSION N/A 07/18/2020   Procedure: CARDIOVERSION;  Surgeon: Elouise Munroe, MD;  Location[facility] ENDOSCOPY;  Service: Cardiovascular;  Laterality: N/A;   CHOLECYSTECTOMY  11/27/2011   Procedure: LAPAROSCOPIC CHOLECYSTECTOMY WITH INTRAOPERATIVE CHOLANGIOGRAM;  Surgeon: Adin Hector, MD;  Location: [place];  Service: General;  Laterality: N/A;  laparoscopic cholecystectomy with choleangiogram umbilical hernia repair   COLONOSCOPY  07/2014   WNL Amedeo Plenty)   COLONOSCOPY  08/2019   3 TAs, rpt 3 yrs ([place])   dexa  08/2012   normal per patient - no records available   ESOPHAGOGASTRODUODENOSCOPY  08/2019   reactive gastropathy, neg H pylori ([place])   EYE SURGERY Bilateral 2014   cataract removal   MITRAL VALVE REPAIR  1980   open heart   ORIF ANKLE FRACTURE Left 02/26/2015   Procedure: OPEN REDUCTION INTERNAL FIXATION (ORIF) LEFT TRIMALLEOLAR ANKLE FRACTURE;  Surgeon: [name], MD;  Location: [place];  Service: Orthopedics;  Laterality: Left;   TUBAL LIGATION  3762   UMBILICAL HERNIA REPAIR  11/27/2011   Procedure: HERNIA REPAIR UMBILICAL ADULT;  Surgeon:  Adin Hector, MD;  Location: [place];  Service: General;  Laterality: N/A;   [place]   for fibroids -- partial, ovaries remain    Current Outpatient Medications  Medication Sig Dispense Refill   ALPRAZolam (XANAX) 0.5 MG tablet Take 1 tablet (0.5 mg total) by mouth 2 (two) times daily. 180 tablet 2   amitriptyline (ELAVIL) 50 MG tablet Take 1 tablet (50 mg total) by mouth at bedtime. 90 tablet 2   antiseptic oral rinse (BIOTENE) LIQD 15 mLs by Mouth Rinse route at bedtime.     apixaban (ELIQUIS) 5 MG TABS tablet Take 1 tablet (5 mg total) by mouth 2 (two) times  daily. 60 tablet 6   cholecalciferol (VITAMIN D) 1000 UNITS tablet Take 1,000 Units by mouth daily.     Co-Enzyme Q-10 100 MG CAPS Take 1 capsule (100 mg total) by mouth daily.  0   diclofenac sodium (VOLTAREN) 1 % GEL Apply 1 application topically 2 (two) times daily as needed (pain).     diltiazem (CARDIZEM CD) 360 MG 24 hr capsule Take 1 capsule (360 mg total) by mouth daily. 90 capsule 3   docusate sodium (COLACE) 100 MG capsule Take 1 capsule (100 mg total) by mouth daily.     escitalopram (LEXAPRO) 20 MG tablet Take 1 tablet (20 mg total) by mouth 2 (two) times daily. 180 tablet 2   ezetimibe (ZETIA) 10 MG tablet Take 1 tablet (10 mg total) by mouth at bedtime. 90 tablet 3   furosemide (LASIX) 20 MG tablet TAKE 1 TABLET BY MOUTH  DAILY AS NEEDED FOR EDEMA (Patient taking differently: Take 40 mg by mouth daily as needed for edema.) 90 tablet 3   gabapentin (NEURONTIN) 300 MG capsule Take 2 capsules every morning, 1 capsule every afternoon and 2 capsules at bedtime (Patient taking differently: Take 300-600 mg by mouth See admin instructions. Take 2 capsules every morning, 1 capsule every afternoon and 2 capsules at bedtime) 450 capsule 3   losartan (COZAAR) 50 MG tablet Take 1 tablet (50 mg total) by mouth daily. 90 tablet 3   lovastatin (MEVACOR) 10 MG tablet TAKE 1 TABLET BY MOUTH  EVERY MONDAY, WEDNESDAY,  AND FRIDAY (Patient taking differently: Take 10 mg by mouth every Monday, Wednesday, and Friday. TAKE 1 TABLET BY MOUTH  EVERY MONDAY, WEDNESDAY,  AND FRIDAY) 39 tablet 3   metoprolol tartrate (LOPRESSOR) 50 MG tablet Take 1.5 tablets (75 mg total) by mouth 2 (two) times daily. 270 tablet 3   pantoprazole (PROTONIX) 40 MG tablet Take 40 mg by mouth daily.     Polyethyl Glycol-Propyl Glycol (SYSTANE) 0.4-0.3 % GEL ophthalmic gel Place 1 drop into both eyes 2 (two) times daily.      polyethylene glycol powder (GLYCOLAX/MIRALAX) 17 GM/SCOOP powder Take 8.5-17 g by mouth daily as needed for  moderate constipation. (1/2-1 capful daily as needed) 3350 g 1   RESTASIS 0.05 % ophthalmic emulsion INSTILL ONE DROP IN BOTH  EYES TWO TIMES DAILY (Patient taking differently: Place 1 drop into both eyes 2 (two) times daily.) 180 each 0   traMADol (ULTRAM) 50 MG tablet Take 1 tablet (50 mg total) by mouth daily as needed. (Patient taking differently: Take 50 mg by mouth daily as needed for severe pain.) 30 tablet 0   No current facility-administered medications for this encounter.    Allergies  Allergen Reactions   Cymbalta [Duloxetine Hcl] Other (See Comments)    tachycardia   Statins Nausea  Only and Other (See Comments)    Muscle cramps also   Sulfa Antibiotics Nausea And Vomiting    Social History   Socioeconomic History   Marital status: Married    Spouse name: [name]   Number of children: 4   Years of education: 12   Highest education level: Not on file  Occupational History    Employer: UNEMPLOYED    Comment: Disability  Tobacco Use   Smoking status: Never   Smokeless tobacco: Never  Vaping Use   Vaping Use: Never used  Substance and Sexual Activity   Alcohol use: No    Alcohol/week: 0.0 standard drinks   Drug use: No   Sexual activity: Not Currently    Birth control/protection: Surgical  Other Topics Concern   Not on file  Social History Narrative   Lives with son, 1 dog   Occupation: unemployed, on disability for fibromyalgia since 2008.   Edu: HS   Religion: Jehova's witness   Activity: volunteers at senior center   Diet: some water, fruits/vegetables daily   No caffeine use   Social Determinants of Health   Financial Resource Strain: Not on file  Food Insecurity: Not on file  Transportation Needs: Not on file  Physical Activity: Not on file  Stress: Not on file  Social Connections: Not on file  Intimate Partner Violence: Not on file    Family History  Problem Relation Age of Onset   Lupus Sister        and niece   Cancer Mother        lung  (nonsmoker)   CAD Mother        MI in her 6s   ALS Mother    Kidney disease Father    Alcohol abuse Father    Diabetes Father    Cancer Brother        bone   Diabetes Sister    Stroke Sister    Cancer Maternal Uncle        bone   Depression Sister    Lupus Sister    Kidney failure Other        on HD   Diabetes Brother    Heart attack Brother    Stroke Maternal Grandmother    Blindness Sister    Healthy Son    Healthy Son    Healthy Son    Healthy Son     ROS- All systems are reviewed and negative except as per the HPI above  Physical Exam: Vitals:   09/12/20 1319  Weight: 94.6 kg  Height: 5\' 1"  (1.549 m)   Wt Readings from Last 3 Encounters:  09/12/20 94.6 kg  07/11/20 92.1 kg  07/10/20 91.6 kg    Labs: Lab Results  Component Value Date   NA 139 07/18/2020   K 4.5 07/18/2020   CL 110 07/18/2020   CO2 27 07/11/2020   GLUCOSE 96 07/18/2020   BUN 12 07/18/2020   CREATININE 1.30 (H) 07/18/2020   CALCIUM 9.8 07/11/2020   PHOS 3.2 07/11/2020   MG 1.8 04/05/2010   Lab Results  Component Value Date   INR 0.93 10/12/2015   Lab Results  Component Value Date   CHOL 167 05/29/2020   HDL 71 05/29/2020   [place] 82 05/29/2020   TRIG 75 05/29/2020     GEN- The patient is well appearing, alert and oriented x 3 today.   Head- normocephalic, atraumatic Eyes-  Sclera clear, conjunctiva pink Ears- hearing intact Oropharynx- clear Neck-  supple, no JVP Lymph- no cervical lymphadenopathy Lungs- Clear to ausculation bilaterally, normal work of breathing Heart-irregular rate and rhythm, no murmurs, rubs or gallops, PMI not laterally displaced GI- soft, NT, ND, + BS Extremities- no clubbing, cyanosis, or edema MS- no significant deformity or atrophy Skin- no rash or lesion Psych- euthymic mood, full affect Neuro- strength and sensation are intact  EKG- appears typical  atrial flutter at 62 bpm, 4:1 conduction     Assessment and Plan:  1. Probable  typical atrial flutter  Successful CV but ERAfl and symptomatic  Limited options with antiarrythmic drugs With heart failure, limits multaq and flecainide  Relatively young for amiodarone and Tikosyn a concern on lexapro and with CKD, cr cl cal less than 60,  will only be able to take 250 mcg bid based on last creatinine in epic  I will refer her to Dr. Lovena Le to be considered for an typical atrial flutter ablation   2. HFpEF HF with preserved ejection fraction Weight is up 6 lbs Pt started back on prn lasix yesterday   3. HTN Stable today   Referral sent to Dr. Tanna Furry office   Geroge Baseman. Trumaine Wimer, [facility] [address] [place], [place] [address] [phone]

## 2020-09-19 ENCOUNTER — Telehealth: Payer: Self-pay

## 2020-09-19 ENCOUNTER — Telehealth: Payer: Medicare Other

## 2020-09-19 NOTE — Chronic Care Management (AMB) (Addendum)
Chronic Care Management Pharmacy Assistant   Name: Venita Seng  MRN: [id number] DOB: 1950-12-01  Reason for Encounter: Disease State HTN   Recent office visits:  07/11/20 - Dr.Gutierrez PCP labs ordered follow up 6 months  Recent consult visits:  09/12/20 - Cardiology no medication changes referral for cardiac ablation procedure restarted Lasix yesterday due to 6 lb weight gain  08/18/20 - Behavioral Health telemedicine no data found 07/18/20 - [facility] cardioversion procedure 07/10/20 - Rheumatology no medication changes 07/06/20 - Cardiology no medication changes 06/23/20 - Pulmonology  no medication changes  Hospital visits:  07/18/20 - Cardiology cardioversion procedure  Medications: Outpatient Encounter Medications as of 09/19/2020  Medication Sig   ALPRAZolam (XANAX) 0.5 MG tablet Take 1 tablet (0.5 mg total) by mouth 2 (two) times daily.   amitriptyline (ELAVIL) 50 MG tablet Take 1 tablet (50 mg total) by mouth at bedtime.   antiseptic oral rinse (BIOTENE) LIQD 15 mLs by Mouth Rinse route at bedtime.   apixaban (ELIQUIS) 5 MG TABS tablet Take 1 tablet (5 mg total) by mouth 2 (two) times daily.   cholecalciferol (VITAMIN D) 1000 UNITS tablet Take 1,000 Units by mouth daily.   Co-Enzyme Q-10 100 MG CAPS Take 1 capsule (100 mg total) by mouth daily.   diclofenac sodium (VOLTAREN) 1 % GEL Apply 1 application topically 2 (two) times daily as needed (pain).   diltiazem (CARDIZEM CD) 360 MG 24 hr capsule Take 1 capsule (360 mg total) by mouth daily.   docusate sodium (COLACE) 100 MG capsule Take 1 capsule (100 mg total) by mouth daily.   escitalopram (LEXAPRO) 20 MG tablet Take 1 tablet (20 mg total) by mouth 2 (two) times daily.   ezetimibe (ZETIA) 10 MG tablet Take 1 tablet (10 mg total) by mouth at bedtime.   furosemide (LASIX) 20 MG tablet TAKE 1 TABLET BY MOUTH  DAILY AS NEEDED FOR EDEMA (Patient taking differently: Take 40 mg by mouth daily as needed for edema.)    gabapentin (NEURONTIN) 300 MG capsule Take 2 capsules every morning, 1 capsule every afternoon and 2 capsules at bedtime (Patient taking differently: Take 300-600 mg by mouth See admin instructions. Take 2 capsules every morning, 1 capsule every afternoon and 2 capsules at bedtime)   losartan (COZAAR) 50 MG tablet Take 1 tablet (50 mg total) by mouth daily.   lovastatin (MEVACOR) 10 MG tablet TAKE 1 TABLET BY MOUTH  EVERY MONDAY, WEDNESDAY,  AND FRIDAY (Patient taking differently: Take 10 mg by mouth every Monday, Wednesday, and Friday. TAKE 1 TABLET BY MOUTH  EVERY MONDAY, WEDNESDAY,  AND FRIDAY)   metoprolol tartrate (LOPRESSOR) 50 MG tablet Take 1.5 tablets (75 mg total) by mouth 2 (two) times daily.   pantoprazole (PROTONIX) 40 MG tablet Take 40 mg by mouth daily.   Polyethyl Glycol-Propyl Glycol (SYSTANE) 0.4-0.3 % GEL ophthalmic gel Place 1 drop into both eyes 2 (two) times daily.    polyethylene glycol powder (GLYCOLAX/MIRALAX) 17 GM/SCOOP powder Take 8.5-17 g by mouth daily as needed for moderate constipation. (1/2-1 capful daily as needed)   RESTASIS 0.05 % ophthalmic emulsion INSTILL ONE DROP IN BOTH  EYES TWO TIMES DAILY (Patient taking differently: Place 1 drop into both eyes 2 (two) times daily.)   traMADol (ULTRAM) 50 MG tablet Take 1 tablet (50 mg total) by mouth daily as needed. (Patient taking differently: Take 50 mg by mouth daily as needed for severe pain.)   No facility-administered encounter medications on  file as of 09/19/2020.   Recent Office Vitals: BP Readings from Last 3 Encounters:  09/12/20 (!) 134/56  07/18/20 (!) 142/63  07/11/20 120/68   Pulse Readings from Last 3 Encounters:  09/12/20 62  07/18/20 64  07/11/20 70    Wt Readings from Last 3 Encounters:  09/12/20 208 lb 9.6 oz (94.6 kg)  07/11/20 203 lb 1 oz (92.1 kg)  07/10/20 202 lb (91.6 kg)     Kidney Function Lab Results  Component Value Date/Time   CREATININE 1.30 (H) 07/18/2020 11:34 AM   CREATININE  1.38 (H) 07/11/2020 12:29 PM   CREATININE 1.28 (H) 04/20/2019 03:11 PM   CREATININE 1.27 (H) 01/21/2019 10:32 AM   GFR 39.48 (L) 07/11/2020 12:29 PM   GFRNONAA 44 (L) 11/11/2019 04:26 PM   GFRNONAA 44 (L) 04/20/2019 03:11 PM   GFRAA 51 (L) 11/11/2019 04:26 PM   GFRAA 50 (L) 04/20/2019 03:11 PM    BMP Latest Ref Rng & Units 07/18/2020 07/11/2020 05/29/2020  Glucose 70 - 99 mg/dL 96 90 105(H)  BUN 8 - 23 mg/dL 12 12 10   Creatinine 0.44 - 1.00 mg/dL 1.30(H) 1.38(H) 1.33(H)  BUN/Creat Ratio 12 - 28 - - 8(L)  Sodium 135 - 145 mmol/L 139 138 139  Potassium 3.5 - 5.1 mmol/L 4.5 4.4 4.6  Chloride 98 - 111 mmol/L 110 106 105  CO2 19 - 32 mEq/L - 27 21  Calcium 8.4 - 10.5 mg/dL - 9.8 9.7   Current antihypertensive regimen:      Diltiazem 360 mg 24 hr - 1 capsule daily             Metoprolol tartrate 50 mg - 1 and 1/2 tablets (75 mg) twice daily             Losartan 50 mg - 1 tablet daily  Lasix 20mg  take 1 tablet daily as needed (patient taking differently: 2 tablets daily as needed)  Patient verbally confirms she is taking the above medications as directed: Yes she is doubling up on the lasix as instructed by cardiology due to continuing to have edema, has appointment scheduled with cardiologist 10/25/20  How often are you checking your Blood Pressure? daily  she checks her blood pressure in the morning and at nighttime   Current home BP readings:   DATE:             BP               PULSE    09/15/20  130/78  62    09/16/20  138/66  76    09/18/20  136/64  66    09/19/20  134/66  62  Wrist or arm cuff: arm cuff Caffeine intake: no caffeine use  Salt intake: uses salt substitute OTC medications including pseudoephedrine or NSAIDs? no  Any readings above 180/120? No  What recent interventions/DTPs have been made by any provider to improve Blood Pressure control since last CPP Visit: 09/02/20 Restart Lasix 20mg  taking 2 tablets daily due to > 6 lb weight gain  Any recent hospitalizations or  ED visits since last visit with CPP? Yes 07/18/20 - Cardiology cardioversion procedure  What diet changes have been made to improve Blood Pressure Control? The patient reports no longer uses plain salt, she will use salt substitute  What exercise is being done to improve your Blood Pressure Control? The patient reports she has been having issues with being out of breath after small tasks and she has appointment  10/25/20 for second opinion from a Cardiologist, Dr.Taylor   Adherence Review: Is the patient currently on ACE/ARB medication? Yes Does the patient have >5 day gap between last estimated fill dates? No   Star Rating Drugs: Medication:  Last Fill: Day Supply Lovastatin 10mg  09/14/20 90 Losartan 50mg  08/02/20 90   Follow-Up:  Pharmacist Review  Debbora Dus, CPP notified  Avel Sensor, [place] Assistant [phone]  I have reviewed the care management and care coordination activities outlined in this encounter and I am certifying that I agree with the content of this note. No further action required.  Debbora Dus, PharmD Clinical Pharmacist [facility] [phone]

## 2020-09-20 ENCOUNTER — Other Ambulatory Visit: Payer: Self-pay | Admitting: *Deleted

## 2020-09-20 DIAGNOSIS — Z5181 Encounter for therapeutic drug level monitoring: Secondary | ICD-10-CM

## 2020-09-20 DIAGNOSIS — G8929 Other chronic pain: Secondary | ICD-10-CM

## 2020-09-20 MED ORDER — TRAMADOL HCL 50 MG PO TABS: 50.0000 mg | ORAL_TABLET | Freq: Every day | ORAL | 0 refills | Status: DC | PRN

## 2020-09-20 NOTE — Telephone Encounter (Signed)
Refill request received via fax  Last Visit: 07/10/2020 Next Visit: 12/11/2020 UDS:02/14/2020 Narc Agreement: 02/13/2021  Last Fill: 03/14/2020  Patient advised she is due to update UDS and narcotic agreement. Patient states she will come in tomorrow to update.   Okay to refill Tramadol?

## 2020-09-21 ENCOUNTER — Other Ambulatory Visit: Payer: Self-pay | Admitting: *Deleted

## 2020-09-21 DIAGNOSIS — G8929 Other chronic pain: Secondary | ICD-10-CM | POA: Diagnosis not present

## 2020-09-21 DIAGNOSIS — Z5181 Encounter for therapeutic drug level monitoring: Secondary | ICD-10-CM

## 2020-09-26 NOTE — Progress Notes (Signed)
UDS is negative for tramadol.  Patient takes tramadol only on as needed basis.

## 2020-09-27 LAB — DRUG MONITOR, PANEL 5, W/CONF, URINE
Alphahydroxyalprazolam: 801 ng/mL — ABNORMAL HIGH (ref <25)
Alphahydroxymidazolam: NEGATIVE ng/mL (ref <50)
Alphahydroxytriazolam: NEGATIVE ng/mL (ref <50)
Aminoclonazepam: NEGATIVE ng/mL (ref <25)
Amphetamines: NEGATIVE ng/mL (ref <500)
Barbiturates: NEGATIVE ng/mL (ref <300)
Benzodiazepines: POSITIVE ng/mL — AB (ref <100)
Cocaine Metabolite: NEGATIVE ng/mL (ref <150)
Creatinine: 161.9 mg/dL (ref >=20.0)
Hydroxyethylflurazepam: NEGATIVE ng/mL (ref <50)
Lorazepam: NEGATIVE ng/mL (ref <50)
Marijuana Metabolite: NEGATIVE ng/mL (ref <20)
Methadone Metabolite: NEGATIVE ng/mL (ref <100)
Nordiazepam: NEGATIVE ng/mL (ref <50)
Opiates: NEGATIVE ng/mL (ref <100)
Oxazepam: NEGATIVE ng/mL (ref <50)
Oxidant: NEGATIVE ug/mL (ref <200)
Oxycodone: NEGATIVE ng/mL (ref <100)
Temazepam: NEGATIVE ng/mL (ref <50)
pH: 8.8 (ref 4.5–9.0)

## 2020-09-27 LAB — DM TEMPLATE

## 2020-09-27 LAB — DRUG MONITOR, TRAMADOL,QN, URINE
Desmethyltramadol: NEGATIVE ng/mL (ref <100)
Tramadol: NEGATIVE ng/mL (ref <100)

## 2020-10-01 ENCOUNTER — Other Ambulatory Visit: Payer: Self-pay | Admitting: Cardiology

## 2020-10-01 DIAGNOSIS — I4892 Unspecified atrial flutter: Secondary | ICD-10-CM

## 2020-10-02 NOTE — Telephone Encounter (Signed)
37f, 92.1kg, scr 1.3 07/18/20, lovw/carroll 09/12/20

## 2020-10-06 ENCOUNTER — Telehealth: Payer: Self-pay | Admitting: Pharmacist

## 2020-10-06 ENCOUNTER — Ambulatory Visit (INDEPENDENT_AMBULATORY_CARE_PROVIDER_SITE_OTHER): Payer: Medicare Other | Admitting: Internal Medicine

## 2020-10-06 ENCOUNTER — Encounter: Payer: Self-pay | Admitting: Internal Medicine

## 2020-10-06 ENCOUNTER — Other Ambulatory Visit: Payer: Self-pay

## 2020-10-06 VITALS — BP 164/86 | HR 64 | Ht 61.0 in | Wt 203.0 lb

## 2020-10-06 DIAGNOSIS — I483 Typical atrial flutter: Secondary | ICD-10-CM

## 2020-10-06 NOTE — Telephone Encounter (Signed)
Medication list reviewed in anticipation of upcoming Tikosyn initiation. Patient is taking 2 contraindicated or QTc prolonging medications.   Escitalopram: Concurrent use of ESCITALOPRAM and QT INTERVAL-PROLONGING DRUGS may result in increased risk of QT-interval prolongation.  Amitriptyline: Concurrent use of CLASS III ANTIARRHYTHMIC AGENTS and TRICYCLIC ANTIDEPRESSANTS may result in an increased risk of cardiotoxicity (QT prolongation, torsades de pointes, cardiac arrest).  Both were prescribed by patient's behavioral health provider  Patient is anticoagulated on Eliquis on the appropriate dose. Please ensure that patient has not missed any anticoagulation doses in the 3 weeks prior to Tikosyn initiation.   Patient will need to be counseled to avoid use of Benadryl while on Tikosyn and in the 2-3 days prior to Tikosyn initiation.

## 2020-10-06 NOTE — Progress Notes (Signed)
HPI The patient is a pleasant 70 yo woman with a h/o MV repair over 40 years ago. She has developed atrial flutter and has undergone DCCV with early return of atrial flutter. Her rates have been well controlled. She denies chest pain, sob, or syncope although she feels better in NSR than when she goes into atrial flutter.  Allergies  Allergen Reactions   Cymbalta [Duloxetine Hcl] Other (See Comments)    tachycardia   Statins Nausea Only and Other (See Comments)    Muscle cramps also   Sulfa Antibiotics Nausea And Vomiting     Current Outpatient Medications  Medication Sig Dispense Refill   ALPRAZolam (XANAX) 0.5 MG tablet Take 1 tablet (0.5 mg total) by mouth 2 (two) times daily. 180 tablet 2   amitriptyline (ELAVIL) 50 MG tablet Take 1 tablet (50 mg total) by mouth at bedtime. 90 tablet 2   antiseptic oral rinse (BIOTENE) LIQD 15 mLs by Mouth Rinse route at bedtime.     apixaban (ELIQUIS) 5 MG TABS tablet TAKE 1 TABLET BY MOUTH  TWICE DAILY 180 tablet 1   cholecalciferol (VITAMIN D) 1000 UNITS tablet Take 1,000 Units by mouth daily.     Co-Enzyme Q-10 100 MG CAPS Take 1 capsule (100 mg total) by mouth daily.  0   diclofenac sodium (VOLTAREN) 1 % GEL Apply 1 application topically 2 (two) times daily as needed (pain).     diltiazem (CARDIZEM CD) 360 MG 24 hr capsule Take 1 capsule (360 mg total) by mouth daily. 90 capsule 3   docusate sodium (COLACE) 100 MG capsule Take 1 capsule (100 mg total) by mouth daily.     escitalopram (LEXAPRO) 20 MG tablet Take 1 tablet (20 mg total) by mouth 2 (two) times daily. 180 tablet 2   ezetimibe (ZETIA) 10 MG tablet Take 1 tablet (10 mg total) by mouth at bedtime. 90 tablet 3   furosemide (LASIX) 20 MG tablet TAKE 1 TABLET BY MOUTH  DAILY AS NEEDED FOR EDEMA (Patient taking differently: Take 40 mg by mouth daily as needed for edema.) 90 tablet 3   gabapentin (NEURONTIN) 300 MG capsule Take 2 capsules every morning, 1 capsule every afternoon and 2  capsules at bedtime (Patient taking differently: Take 300-600 mg by mouth See admin instructions. Take 2 capsules every morning, 1 capsule every afternoon and 2 capsules at bedtime) 450 capsule 3   lovastatin (MEVACOR) 10 MG tablet TAKE 1 TABLET BY MOUTH  EVERY MONDAY, WEDNESDAY,  AND FRIDAY (Patient taking differently: Take 10 mg by mouth every Monday, Wednesday, and Friday. TAKE 1 TABLET BY MOUTH  EVERY MONDAY, WEDNESDAY,  AND FRIDAY) 39 tablet 3   metoprolol tartrate (LOPRESSOR) 50 MG tablet Take 1.5 tablets (75 mg total) by mouth 2 (two) times daily. 270 tablet 3   pantoprazole (PROTONIX) 40 MG tablet Take 40 mg by mouth daily.     Polyethyl Glycol-Propyl Glycol (SYSTANE) 0.4-0.3 % GEL ophthalmic gel Place 1 drop into both eyes 2 (two) times daily.      polyethylene glycol powder (GLYCOLAX/MIRALAX) 17 GM/SCOOP powder Take 8.5-17 g by mouth daily as needed for moderate constipation. (1/2-1 capful daily as needed) 3350 g 1   RESTASIS 0.05 % ophthalmic emulsion INSTILL ONE DROP IN BOTH  EYES TWO TIMES DAILY (Patient taking differently: Place 1 drop into both eyes 2 (two) times daily.) 180 each 0   traMADol (ULTRAM) 50 MG tablet Take 1 tablet (50 mg total) by mouth daily  as needed. 30 tablet 0   losartan (COZAAR) 50 MG tablet Take 1 tablet (50 mg total) by mouth daily. 90 tablet 3   No current facility-administered medications for this visit.     Past Medical History:  Diagnosis Date   Ankle fracture, left 02/19/2015   from a fall   Anxiety    Cataract 2014   corrected with surgery   CHF (congestive heart failure) (HCC)    CKD (chronic kidney disease) stage 3, GFR 30-59 ml/min [facility])    saw nephrologist Dr Harden Mo   DDD (degenerative disc disease), cervical    Depression    Fibromyalgia    Glaucoma    s/p surgery, sees ophtho Q6 mo   History of DVT (deep vein thrombosis) several times latest 2012   receives coumadin while hospitalized   History of kidney stones 2010   History of  pulmonary embolism 2001, 2006   completed coumadin courses   History of rheumatic fever x3   HLD (hyperlipidemia)    HTN (hypertension)    Insomnia    Lung nodule 09/22/2012   RLL - 2m, stable since 2014. Thought benign.    Osteoarthritis    shoulders and knees, not RA per Dr DEstanislado Pandy positive ANA, positive Ro   Osteopenia 09/18/2015   DEXA T -1.1 hip, -0.2 spine 08/2015    Personal history of urinary calculi latest 2014   Pneumonia 12/02/2011   PONV (postoperative nausea and vomiting)    Refusal of blood transfusions as patient is Jehovah's Witness    Rheumatic heart disease 1980   s/p mitral valve repair 1980   [place]'s [place]    Trimalleolar fracture of left ankle 02/23/2015    ROS:   All systems reviewed and negative except as noted in the HPI.   Past Surgical History:  Procedure Laterality Date   BREAST BIOPSY Right 2006   benign   CARDIOVERSION N/A 07/18/2020   Procedure: CARDIOVERSION;  Surgeon: AElouise Munroe MD;  [facility]  Service: Cardiovascular;  Laterality: N/A;   CHOLECYSTECTOMY  11/27/2011   Procedure: LAPAROSCOPIC CHOLECYSTECTOMY WITH INTRAOPERATIVE CHOLANGIOGRAM;  Surgeon: HAdin Hector MD;  [place] [facility]  Service: General;  Laterality: N/A;  laparoscopic cholecystectomy with choleangiogram umbilical hernia repair   COLONOSCOPY  07/2014   WNL ([facility]   COLONOSCOPY  08/2019   3 TAs, rpt 3 yrs ([place])   dexa  08/2012   normal per patient - no records available   ESOPHAGOGASTRODUODENOSCOPY  08/2019   reactive gastropathy, neg H pylori ([place]   EYE SURGERY Bilateral 2014   cataract removal   MITRAL VALVE REPAIR  1980   open heart   ORIF ANKLE FRACTURE Left 02/26/2015   Procedure: OPEN REDUCTION INTERNAL FIXATION (ORIF) LEFT TRIMALLEOLAR ANKLE FRACTURE;  Surgeon: NLeandrew Koyanagi MD;  [place] [facility] [place]  Service: Orthopedics;  Laterality: Left;   TUBAL LIGATION  [id number]  UMBILICAL HERNIA REPAIR  11/27/2011   Procedure: HERNIA  REPAIR UMBILICAL ADULT;  Surgeon: HAdin Hector MD;  [place] [facility]  Service: General;  Laterality: N/A;   [place]  for fibroids -- partial, ovaries remain     Family History  Problem Relation Age of Onset   Lupus Sister        and niece   Cancer Mother        lung (nonsmoker)   CAD Mother        MI in her 672s  ALS Mother  Kidney disease Father    Alcohol abuse Father    Diabetes Father    Cancer Brother        bone   Diabetes Sister    Stroke Sister    Cancer Maternal Uncle        bone   Depression Sister    Lupus Sister    Kidney failure Other        on HD   Diabetes Brother    Heart attack Brother    Stroke Maternal Grandmother    Blindness Sister    Healthy Son    Healthy Son    Healthy Son    Healthy Son      Social History   Socioeconomic History   Marital status: Married    Spouse name: [name]   Number of children: 4   Years of education: 12   Highest education level: Not on file  Occupational History    Employer: UNEMPLOYED    Comment: Disability  Tobacco Use   Smoking status: Never   Smokeless tobacco: Never  Vaping Use   Vaping Use: Never used  Substance and Sexual Activity   Alcohol use: No    Alcohol/week: 0.0 standard drinks   Drug use: No   Sexual activity: Not Currently    Birth control/protection: Surgical  Other Topics Concern   Not on file  Social History Narrative   Lives with son, 1 dog   Occupation: unemployed, on disability for fibromyalgia since 2008.   Edu: HS   Religion: Jehova's witness   Activity: volunteers at senior center   Diet: some water, fruits/vegetables daily   No caffeine use   Social Determinants of Health   Financial Resource Strain: Not on file  Food Insecurity: Not on file  Transportation Needs: Not on file  Physical Activity: Not on file  Stress: Not on file  Social Connections: Not on file  Intimate Partner Violence: Not on file     BP (!) 164/86   Pulse 64    Ht '5\' 1"'$  (1.549 m)   Wt 203 lb (92.1 kg)   SpO2 95%   BMI 38.36 kg/m   Physical Exam:  Well appearing NAD HEENT: Unremarkable Neck:  No JVD, no thyromegally Lymphatics:  No adenopathy Back:  No CVA tenderness Lungs:  Clear with no wheezes HEART:  Regular rate rhythm, no murmurs, no rubs, no clicks Abd:  soft, positive bowel sounds, no organomegally, no rebound, no guarding Ext:  2 plus pulses, no edema, no cyanosis, no clubbing Skin:  No rashes no nodules Neuro:  CN II through XII intact, motor grossly intact   EKG - atrial flutter with a controlled VR  Assess/Plan:  Atrial flutter - unfortunately her flutter is LA flutter. She has positive flutter waves in lead V1 and in the inferior leads. She would be a poor candidate for ablation. I discussed the treatment options with the patient. Dofetilide is her best option. I have reviewed the pro's and con's of dofetilide and amiodarone. Her QT when she was in NSR was acceptable.  Chronic renal insufficiency - her dose of dofetilide will be adjusted for her renal function.    Alexandria Leach Rosalee Tolley,MD

## 2020-10-06 NOTE — Patient Instructions (Signed)
Medication Instructions:  Your physician recommends that you continue on your current medications as directed. Please refer to the Current Medication list given to you today.  Labwork: None ordered.  Testing/Procedures: None ordered.  Follow-Up: Your physician wants you to follow-up after dofetilide admission.  Any Other Special Instructions Will Be Listed Below (If Applicable).  If you need a refill on your cardiac medications before your next appointment, please call your pharmacy.   Tikosyn (Dofetilide) Hospital Admission  Prior to day of admission: Check with drug insurance company for cost of drug to ensure affordability --- Dofetilide 500 mcg twice a day.  GoodRx is an option if insurance copay is unaffordable.  All patients are tested for COVID-19 prior to admission.  No Benadryl is allowed 3 days prior to admission.  Please ensure no missed doses of your anticoagulation (blood thinner) for 3 weeks prior to admission. If a dose is missed please notify our office immediately.  A pharmacist will review all your medications for potential interactions with Tikosyn. If any medication changes are needed prior to admission we will be in touch with you.  If any new medications are started AFTER your admission date is set with Nurse, adult. Please notify our office immediately so your medication list can be updated and reviewed by our pharmacist again. On day of admission: Tikosyn initiation requires a 3 night/4 day hospital stay with constant telemetry monitoring. You will have an EKG after each dose of Tikosyn as well as daily lab draws.  If the drug does not convert you to normal rhythm a cardioversion after the 4th dose of Tikosyn.  [facility] office visit on the morning of admission is needed for preliminary labs/ekg.  Time of admission is dependent on bed availability in the hospital. In some instances, you will be sent home until bed is available. Rarely admission can be  delayed to the following day if hospital census prevents available beds.  You may bring personal belongings/clothing with you to the hospital. Please leave your suitcase in the car until you arrive in admissions.  Questions please call our office at [phone]

## 2020-10-10 NOTE — Progress Notes (Signed)
HPI: FU mitral valve repair secondary to rheumatic heart disease and atrial flutter. Patient is status post mitral valve repair in 1980. Operative report not available. Nuclear study 11/14 showed EF 66 and normal perfusion. Holter 11/14 showed sinus with pacs, pvcs and brief PAT. Carotid Dopplers October 2019 showed no significant obstruction. Echocardiogram August 2021 showed normal LV function, grade 2 diastolic dysfunction, severe left atrial enlargement, moderate mitral stenosis, moderate mitral regurgitation, mild aortic stenosis, mild aortic insufficiency and moderate pulmonary hypertension.  Patient found to be in atrial flutter at last office visit.  Patient underwent successful cardioversion in May 2022.  Atrial flutter recurred.  She was seen by Dr. Lovena Le and Phyllis Ginger recommended.  Since last seen, she has some dyspnea on exertion worse since she is in atrial flutter.  No orthopnea, PND, pedal edema, exertional chest pain or syncope.  Occasional palpitations.  Current Outpatient Medications  Medication Sig Dispense Refill   ALPRAZolam (XANAX) 0.5 MG tablet Take 1 tablet (0.5 mg total) by mouth 2 (two) times daily. 180 tablet 2   antiseptic oral rinse (BIOTENE) LIQD 15 mLs by Mouth Rinse route at bedtime.     apixaban (ELIQUIS) 5 MG TABS tablet TAKE 1 TABLET BY MOUTH  TWICE DAILY 180 tablet 1   cholecalciferol (VITAMIN D) 1000 UNITS tablet Take 1,000 Units by mouth daily.     Co-Enzyme Q-10 100 MG CAPS Take 1 capsule (100 mg total) by mouth daily.  0   diclofenac sodium (VOLTAREN) 1 % GEL Apply 1 application topically 2 (two) times daily as needed (pain).     diltiazem (CARDIZEM CD) 360 MG 24 hr capsule Take 1 capsule (360 mg total) by mouth daily. 90 capsule 3   docusate sodium (COLACE) 100 MG capsule Take 1 capsule (100 mg total) by mouth daily.     ezetimibe (ZETIA) 10 MG tablet Take 1 tablet (10 mg total) by mouth at bedtime. 90 tablet 3   furosemide (LASIX) 20 MG tablet TAKE 1  TABLET BY MOUTH  DAILY AS NEEDED FOR EDEMA (Patient taking differently: Take 40 mg by mouth daily as needed for edema.) 90 tablet 3   gabapentin (NEURONTIN) 300 MG capsule Take 2 capsules every morning, 1 capsule every afternoon and 2 capsules at bedtime (Patient taking differently: Take 300-600 mg by mouth See admin instructions. Take 2 capsules every morning, 1 capsule every afternoon and 2 capsules at bedtime) 450 capsule 3   lovastatin (MEVACOR) 10 MG tablet TAKE 1 TABLET BY MOUTH  EVERY MONDAY, WEDNESDAY,  AND FRIDAY (Patient taking differently: Take 10 mg by mouth every Monday, Wednesday, and Friday. TAKE 1 TABLET BY MOUTH  EVERY MONDAY, WEDNESDAY,  AND FRIDAY) 39 tablet 3   metoprolol tartrate (LOPRESSOR) 50 MG tablet Take 1.5 tablets (75 mg total) by mouth 2 (two) times daily. 270 tablet 3   pantoprazole (PROTONIX) 40 MG tablet Take 40 mg by mouth daily.     PARoxetine (PAXIL) 30 MG tablet Take 1 tablet (30 mg total) by mouth at bedtime. 30 tablet 2   PARoxetine (PAXIL) 30 MG tablet Take 1 tablet (30 mg total) by mouth daily. 14 tablet 2   Polyethyl Glycol-Propyl Glycol (SYSTANE) 0.4-0.3 % GEL ophthalmic gel Place 1 drop into both eyes 2 (two) times daily.      polyethylene glycol powder (GLYCOLAX/MIRALAX) 17 GM/SCOOP powder Take 8.5-17 g by mouth daily as needed for moderate constipation. (1/2-1 capful daily as needed) 3350 g 1   RESTASIS 0.05 %  ophthalmic emulsion INSTILL ONE DROP IN BOTH  EYES TWO TIMES DAILY (Patient taking differently: Place 1 drop into both eyes 2 (two) times daily.) 180 each 0   traMADol (ULTRAM) 50 MG tablet Take 1 tablet (50 mg total) by mouth daily as needed. 30 tablet 0   losartan (COZAAR) 50 MG tablet Take 1 tablet (50 mg total) by mouth daily. 90 tablet 3   No current facility-administered medications for this visit.     Past Medical History:  Diagnosis Date   Ankle fracture, left 02/19/2015   from a fall   Anxiety    Cataract 2014   corrected with surgery    CHF (congestive heart failure) (HCC)    CKD (chronic kidney disease) stage 3, GFR 30-59 ml/min [facility])    saw nephrologist Dr Harden Mo   DDD (degenerative disc disease), cervical    Depression    Fibromyalgia    Glaucoma    s/p surgery, sees ophtho Q6 mo   History of DVT (deep vein thrombosis) several times latest 2012   receives coumadin while hospitalized   History of kidney stones 2010   History of pulmonary embolism 2001, 2006   completed coumadin courses   History of rheumatic fever x3   HLD (hyperlipidemia)    HTN (hypertension)    Insomnia    Lung nodule 09/22/2012   RLL - 19m, stable since 2014. Thought benign.    Osteoarthritis    shoulders and knees, not RA per Dr DEstanislado Pandy positive ANA, positive Ro   Osteopenia 09/18/2015   DEXA T -1.1 hip, -0.2 spine 08/2015    Personal history of urinary calculi latest 2014   Pneumonia 12/02/2011   PONV (postoperative nausea and vomiting)    Refusal of blood transfusions as patient is Jehovah's Witness    Rheumatic heart disease 1980   s/p mitral valve repair 1980   Sjogren's syndrome ([place]    Trimalleolar fracture of left ankle 02/23/2015    Past Surgical History:  Procedure Laterality Date   BREAST BIOPSY Right 2006   benign   CARDIOVERSION N/A 07/18/2020   Procedure: CARDIOVERSION;  Surgeon: AElouise Munroe MD;  [facility];  Service: Cardiovascular;  Laterality: N/A;   CHOLECYSTECTOMY  11/27/2011   Procedure: LAPAROSCOPIC CHOLECYSTECTOMY WITH INTRAOPERATIVE CHOLANGIOGRAM;  Surgeon: HAdin Hector MD;  [facility]  Service: General;  Laterality: N/A;  laparoscopic cholecystectomy with choleangiogram umbilical hernia repair   COLONOSCOPY  07/2014   WNL ([facility]   COLONOSCOPY  08/2019   3 TAs, rpt 3 yrs ([place])   dexa  08/2012   normal per patient - no records available   ESOPHAGOGASTRODUODENOSCOPY  08/2019   reactive gastropathy, neg H pylori ([place]   EYE SURGERY Bilateral 2014   cataract removal    MITRAL VALVE REPAIR  1980   open heart   ORIF ANKLE FRACTURE Left 02/26/2015   Procedure: OPEN REDUCTION INTERNAL FIXATION (ORIF) LEFT TRIMALLEOLAR ANKLE FRACTURE;  Surgeon: NLeandrew Koyanagi MD;  [facility] [facility]  Service: Orthopedics;  Laterality: Left;   TUBAL LIGATION  [id number]  UMBILICAL HERNIA REPAIR  11/27/2011   Procedure: HERNIA REPAIR UMBILICAL ADULT;  Surgeon: HAdin Hector MD;  [place] [facility]  Service: General;  Laterality: N/A;   [place] -- partial, ovaries remain    Social History   Socioeconomic History   Marital status: Married    Spouse name: RBarbaraann Rondo  Number of children: 4   Years  of education: 12   Highest education level: Not on file  Occupational History    Employer: UNEMPLOYED    Comment: Disability  Tobacco Use   Smoking status: Never   Smokeless tobacco: Never  Vaping Use   Vaping Use: Never used  Substance and Sexual Activity   Alcohol use: No    Alcohol/week: 0.0 standard drinks   Drug use: No   Sexual activity: Not Currently    Birth control/protection: Surgical  Other Topics Concern   Not on file  Social History Narrative   Lives with son, 1 dog   Occupation: unemployed, on disability for fibromyalgia since 2008.   Edu: HS   Religion: Jehova's witness   Activity: volunteers at senior center   Diet: some water, fruits/vegetables daily   No caffeine use   Social Determinants of Radio broadcast assistant Strain: Not on file  Food Insecurity: Not on file  Transportation Needs: Not on file  Physical Activity: Not on file  Stress: Not on file  Social Connections: Not on file  Intimate Partner Violence: Not on file    Family History  Problem Relation Age of Onset   Lupus Sister        and niece   Cancer Mother        lung (nonsmoker)   CAD Mother        MI in her 67s   ALS Mother    Kidney disease Father    Alcohol abuse Father    Diabetes Father    Cancer Brother        bone   Diabetes  Sister    Stroke Sister    Cancer Maternal Uncle        bone   Depression Sister    Lupus Sister    Kidney failure Other        on HD   Diabetes Brother    Heart attack Brother    Stroke Maternal Grandmother    Blindness Sister    Healthy Son    Healthy Son    Healthy Son    Healthy Son     ROS: no fevers or chills, productive cough, hemoptysis, dysphasia, odynophagia, melena, hematochezia, dysuria, hematuria, rash, seizure activity, orthopnea, PND, pedal edema, claudication. Remaining systems are negative.  Physical Exam: Well-developed well-nourished in no acute distress.  Skin is warm and dry.  HEENT is normal.  Neck is supple.  Chest is clear to auscultation with normal expansion.  Cardiovascular exam is regular rate and rhythm.  2/6 systolic murmur left sternal border. Abdominal exam nontender or distended. No masses palpated. Extremities show no edema. neuro grossly intact  A/P  1 atrial flutter-patient has been seen by Dr. Lovena Le and Phyllis Ginger recommended.  Admission is pending to initiate.  Continue Cardizem and metoprolol for rate control.  Continue apixaban.  2 history of mitral valve repair-continue SBE prophylaxis.  3 valvular heart disease including aortic stenosis/aortic insufficiency and mitral stenosis/mitral insufficiency-arrange follow-up echocardiogram.  May ultimately require aortic and mitral valve replacement.  4 chronic diastolic congestive heart failure-appears to be euvolemic.  Continue Lasix at present dose.  Hopefully maintaining sinus rhythm with also help with symptoms.  5 hyperlipidemia-continue statin.  6 hypertension-blood pressure controlled.  Continue present medical regimen.  7 history of palpitations-symptoms are controlled.  Continue beta-blocker.  Kirk Ruths, MD

## 2020-10-11 ENCOUNTER — Encounter (HOSPITAL_COMMUNITY): Payer: Self-pay

## 2020-10-12 ENCOUNTER — Telehealth (HOSPITAL_COMMUNITY): Payer: Self-pay | Admitting: *Deleted

## 2020-10-12 NOTE — Telephone Encounter (Signed)
Patient called stating that Dr. Lovena Le with [facility] told her to call this office to have Dr. Harrington Challenger take her off of her amitriptyline (ELAVIL) 50 MG tablet and her escitalopram (LEXAPRO) 20 MG tablet. Per patient she is going to the hospital and they told her to call office to tell Dr. Harrington Challenger to come off of these medications. Per pt they wont admit her until this situation has been resolved.   [phone] is Patient number

## 2020-10-12 NOTE — Telephone Encounter (Signed)
Tell her I nwill need to speak to cardiologist, it probably won't be today as I have a full schedule

## 2020-10-12 NOTE — Telephone Encounter (Signed)
Pt notified Lexapro and Amitriptyline will need to be changed before tikosyn admission can be scheduled. Pt will contact provider and notify when pt has transitioned to schedule admission.

## 2020-10-13 ENCOUNTER — Other Ambulatory Visit (HOSPITAL_COMMUNITY): Payer: Self-pay | Admitting: Psychiatry

## 2020-10-13 MED ORDER — PAROXETINE HCL 30 MG PO TABS: 30.0000 mg | ORAL_TABLET | Freq: Every day | ORAL | 2 refills | Status: DC

## 2020-10-13 NOTE — Progress Notes (Signed)
Patient ID: Alexandria Leach, female   DOB: 12-02-1950, 70 y.o.   MRN: [id number] Spoke to pt by phone after receiving message from cardiologist Dr. Lovena Le. She will decrease amitriptyline to 2 mg for one week, then d/c/ She will d/c lexapro in favor of paxil 30 mg qhs. Pt to f/u in 2 weeks, call if needed sooner

## 2020-10-13 NOTE — Telephone Encounter (Signed)
LMOM

## 2020-10-17 ENCOUNTER — Telehealth (HOSPITAL_COMMUNITY): Payer: Self-pay

## 2020-10-17 ENCOUNTER — Other Ambulatory Visit (HOSPITAL_COMMUNITY): Payer: Self-pay

## 2020-10-17 ENCOUNTER — Encounter (HOSPITAL_COMMUNITY): Payer: Self-pay

## 2020-10-17 DIAGNOSIS — I483 Typical atrial flutter: Secondary | ICD-10-CM

## 2020-10-17 NOTE — Telephone Encounter (Signed)
[facility] admission for date of service 10/23/2020 does not require precert. She is scheduled with Dr. Lovena Le for admission.  Patient has stopped her Lexapro and Amitriptyline and these medications will be out of her system by Friday. She will start her Paxil medication this weekend.  Contacted patient regarding Tikosyn admission. Instructed patient on where to go to be Covid Tested prior to her admission and to not take Benadryl 3 days prior to her admission. She  was told to mask in public 3 days prior to her admission and told to not miss any doses of her blood thinner. She was told to contact our clinic immediately if she was to miss any doses of her blood thinner. She understood to leave her suitcase in her car.   A-fib clinic appointment on 8/8 @ 11:30am with Clint Fenton-PA.We will perform an EKG along with blood work. She is aware where the A-fib clinic is located. Parking code was given to her to access the garage for parking.  She is aware her bed availability will be sometime on Monday however, she may need to go home and wait to be called from bed placement. Patient verbalized understanding.

## 2020-10-17 NOTE — Telephone Encounter (Signed)
Patient stated she already spoke with provider about this matter.

## 2020-10-20 ENCOUNTER — Other Ambulatory Visit: Payer: Self-pay | Admitting: Internal Medicine

## 2020-10-20 LAB — SARS CORONAVIRUS 2 (TAT 6-24 HRS): SARS Coronavirus 2: NEGATIVE

## 2020-10-23 ENCOUNTER — Encounter (HOSPITAL_COMMUNITY): Payer: Self-pay | Admitting: Physician Assistant

## 2020-10-23 ENCOUNTER — Other Ambulatory Visit: Payer: Self-pay

## 2020-10-23 ENCOUNTER — Ambulatory Visit (HOSPITAL_COMMUNITY)
Admission: RE | Admit: 2020-10-23 | Discharge: 2020-10-23 | Disposition: A | Discharge status: Home / Self Care | Payer: Medicare Other | Source: Ambulatory Visit | Attending: Physician Assistant | Admitting: Physician Assistant

## 2020-10-23 ENCOUNTER — Inpatient Hospital Stay (HOSPITAL_COMMUNITY)
Admission: RE | Admit: 2020-10-23 | Discharge: 2020-10-26 | DRG: 309 | Disposition: A | Discharge status: Home / Self Care | Payer: Medicare Other | Source: Ambulatory Visit | Attending: Internal Medicine | Admitting: Internal Medicine

## 2020-10-23 ENCOUNTER — Ambulatory Visit (INDEPENDENT_AMBULATORY_CARE_PROVIDER_SITE_OTHER): Payer: Medicare Other | Admitting: Cardiology

## 2020-10-23 ENCOUNTER — Encounter (HOSPITAL_COMMUNITY): Payer: Self-pay | Admitting: Internal Medicine

## 2020-10-23 ENCOUNTER — Encounter: Payer: Self-pay | Admitting: Cardiology

## 2020-10-23 VITALS — BP 142/88 | HR 57 | Ht 64.0 in | Wt 206.6 lb

## 2020-10-23 VITALS — BP 146/74 | HR 77 | Ht 64.0 in | Wt 205.0 lb

## 2020-10-23 DIAGNOSIS — E7800 Pure hypercholesterolemia, unspecified: Secondary | ICD-10-CM | POA: Diagnosis not present

## 2020-10-23 DIAGNOSIS — I484 Atypical atrial flutter: Secondary | ICD-10-CM | POA: Diagnosis not present

## 2020-10-23 DIAGNOSIS — Z86718 Personal history of other venous thrombosis and embolism: Secondary | ICD-10-CM | POA: Diagnosis not present

## 2020-10-23 DIAGNOSIS — I4892 Unspecified atrial flutter: Secondary | ICD-10-CM | POA: Diagnosis not present

## 2020-10-23 DIAGNOSIS — Z818 Family history of other mental and behavioral disorders: Secondary | ICD-10-CM | POA: Diagnosis not present

## 2020-10-23 DIAGNOSIS — Z821 Family history of blindness and visual loss: Secondary | ICD-10-CM

## 2020-10-23 DIAGNOSIS — Z832 Family history of diseases of the blood and blood-forming organs and certain disorders involving the immune mechanism: Secondary | ICD-10-CM | POA: Diagnosis not present

## 2020-10-23 DIAGNOSIS — E785 Hyperlipidemia, unspecified: Secondary | ICD-10-CM | POA: Diagnosis not present

## 2020-10-23 DIAGNOSIS — I10 Essential (primary) hypertension: Secondary | ICD-10-CM

## 2020-10-23 DIAGNOSIS — M797 Fibromyalgia: Secondary | ICD-10-CM | POA: Diagnosis not present

## 2020-10-23 DIAGNOSIS — D6869 Other thrombophilia: Secondary | ICD-10-CM

## 2020-10-23 DIAGNOSIS — I5032 Chronic diastolic (congestive) heart failure: Secondary | ICD-10-CM | POA: Diagnosis not present

## 2020-10-23 DIAGNOSIS — Z833 Family history of diabetes mellitus: Secondary | ICD-10-CM

## 2020-10-23 DIAGNOSIS — Z811 Family history of alcohol abuse and dependence: Secondary | ICD-10-CM | POA: Diagnosis not present

## 2020-10-23 DIAGNOSIS — Z808 Family history of malignant neoplasm of other organs or systems: Secondary | ICD-10-CM | POA: Diagnosis not present

## 2020-10-23 DIAGNOSIS — Z9889 Other specified postprocedural states: Secondary | ICD-10-CM

## 2020-10-23 DIAGNOSIS — I4891 Unspecified atrial fibrillation: Secondary | ICD-10-CM | POA: Diagnosis not present

## 2020-10-23 DIAGNOSIS — Z7901 Long term (current) use of anticoagulants: Secondary | ICD-10-CM

## 2020-10-23 DIAGNOSIS — N183 Chronic kidney disease, stage 3 unspecified: Secondary | ICD-10-CM | POA: Diagnosis present

## 2020-10-23 DIAGNOSIS — M5030 Other cervical disc degeneration, unspecified cervical region: Secondary | ICD-10-CM | POA: Diagnosis present

## 2020-10-23 DIAGNOSIS — Z882 Allergy status to sulfonamides status: Secondary | ICD-10-CM | POA: Diagnosis not present

## 2020-10-23 DIAGNOSIS — Z823 Family history of stroke: Secondary | ICD-10-CM

## 2020-10-23 DIAGNOSIS — Z8249 Family history of ischemic heart disease and other diseases of the circulatory system: Secondary | ICD-10-CM

## 2020-10-23 DIAGNOSIS — Z79899 Other long term (current) drug therapy: Secondary | ICD-10-CM | POA: Diagnosis not present

## 2020-10-23 DIAGNOSIS — I080 Rheumatic disorders of both mitral and aortic valves: Secondary | ICD-10-CM | POA: Diagnosis not present

## 2020-10-23 DIAGNOSIS — Z841 Family history of disorders of kidney and ureter: Secondary | ICD-10-CM | POA: Diagnosis not present

## 2020-10-23 DIAGNOSIS — Z801 Family history of malignant neoplasm of trachea, bronchus and lung: Secondary | ICD-10-CM

## 2020-10-23 DIAGNOSIS — Z86711 Personal history of pulmonary embolism: Secondary | ICD-10-CM

## 2020-10-23 DIAGNOSIS — Z888 Allergy status to other drugs, medicaments and biological substances status: Secondary | ICD-10-CM

## 2020-10-23 LAB — BASIC METABOLIC PANEL
Anion gap: 7 (ref 5–15)
BUN: 12 mg/dL (ref 8–23)
CO2: 22 mmol/L (ref 22–32)
Calcium: 9.5 mg/dL (ref 8.9–10.3)
Chloride: 107 mmol/L (ref 98–111)
Creatinine, Ser: 1.45 mg/dL — ABNORMAL HIGH (ref 0.44–1.00)
GFR, Estimated: 39 mL/min — ABNORMAL LOW (ref >60)
Glucose, Bld: 104 mg/dL — ABNORMAL HIGH (ref 70–99)
Potassium: 4.2 mmol/L (ref 3.5–5.1)
Sodium: 136 mmol/L (ref 135–145)

## 2020-10-23 LAB — HIV ANTIBODY (ROUTINE TESTING W REFLEX): HIV Screen 4th Generation wRfx: NONREACTIVE

## 2020-10-23 LAB — MAGNESIUM: Magnesium: 1.8 mg/dL (ref 1.7–2.4)

## 2020-10-23 MED ORDER — METOPROLOL TARTRATE 50 MG PO TABS
75.0000 mg | ORAL_TABLET | Freq: Two times a day (BID) | ORAL | Status: DC
  Administered 2020-10-23 – 2020-10-26 (×6): 75 mg via ORAL
  Filled 2020-10-23 (×6): qty 1

## 2020-10-23 MED ORDER — EZETIMIBE 10 MG PO TABS
10.0000 mg | ORAL_TABLET | Freq: Every day | ORAL | Status: DC
  Administered 2020-10-23 – 2020-10-25 (×3): 10 mg via ORAL
  Filled 2020-10-23 (×3): qty 1

## 2020-10-23 MED ORDER — PRAVASTATIN SODIUM 10 MG PO TABS
10.0000 mg | ORAL_TABLET | ORAL | Status: DC
Start: 2020-10-23 — End: 2020-10-26
  Administered 2020-10-23 – 2020-10-25 (×2): 10 mg via ORAL
  Filled 2020-10-23 (×2): qty 1

## 2020-10-23 MED ORDER — POLYETHYL GLYCOL-PROPYL GLYCOL 0.4-0.3 % OP GEL: Freq: Two times a day (BID) | OPHTHALMIC | Status: DC

## 2020-10-23 MED ORDER — ALPRAZOLAM 0.5 MG PO TABS
0.5000 mg | ORAL_TABLET | Freq: Two times a day (BID) | ORAL | Status: DC
  Administered 2020-10-23 – 2020-10-26 (×6): 0.5 mg via ORAL
  Filled 2020-10-23 (×6): qty 1

## 2020-10-23 MED ORDER — SODIUM CHLORIDE 0.9% FLUSH: 3.0000 mL | INTRAVENOUS | Status: DC | PRN

## 2020-10-23 MED ORDER — FUROSEMIDE 20 MG PO TABS: 20.0000 mg | ORAL_TABLET | Freq: Every day | ORAL | Status: DC | PRN

## 2020-10-23 MED ORDER — LOSARTAN POTASSIUM 50 MG PO TABS
50.0000 mg | ORAL_TABLET | Freq: Every day | ORAL | Status: DC
  Administered 2020-10-24 – 2020-10-26 (×3): 50 mg via ORAL
  Filled 2020-10-23 (×3): qty 1

## 2020-10-23 MED ORDER — APIXABAN 5 MG PO TABS
5.0000 mg | ORAL_TABLET | Freq: Two times a day (BID) | ORAL | Status: DC
  Administered 2020-10-23 – 2020-10-26 (×6): 5 mg via ORAL
  Filled 2020-10-23 (×6): qty 1

## 2020-10-23 MED ORDER — VITAMIN D 25 MCG (1000 UNIT) PO TABS
1000.0000 [IU] | ORAL_TABLET | Freq: Every day | ORAL | Status: DC
  Administered 2020-10-24 – 2020-10-26 (×3): 1000 [IU] via ORAL
  Filled 2020-10-23 (×3): qty 1

## 2020-10-23 MED ORDER — DILTIAZEM HCL ER COATED BEADS 180 MG PO CP24
360.0000 mg | ORAL_CAPSULE | Freq: Every day | ORAL | Status: DC
  Administered 2020-10-24 – 2020-10-26 (×3): 360 mg via ORAL
  Filled 2020-10-23 (×3): qty 2

## 2020-10-23 MED ORDER — CYCLOSPORINE 0.05 % OP EMUL
1.0000 [drp] | Freq: Two times a day (BID) | OPHTHALMIC | Status: DC
  Administered 2020-10-23 – 2020-10-26 (×6): 1 [drp] via OPHTHALMIC
  Filled 2020-10-23 (×7): qty 1

## 2020-10-23 MED ORDER — PAROXETINE HCL 30 MG PO TABS
30.0000 mg | ORAL_TABLET | Freq: Every day | ORAL | Status: DC
  Administered 2020-10-23 – 2020-10-25 (×3): 30 mg via ORAL
  Filled 2020-10-23 (×4): qty 1

## 2020-10-23 MED ORDER — DOFETILIDE 250 MCG PO CAPS
250.0000 ug | ORAL_CAPSULE | Freq: Two times a day (BID) | ORAL | Status: DC
  Administered 2020-10-23 – 2020-10-26 (×6): 250 ug via ORAL
  Filled 2020-10-23 (×6): qty 1

## 2020-10-23 MED ORDER — CO-ENZYME Q-10 100 MG PO CAPS: 1.0000 | ORAL_CAPSULE | Freq: Every day | ORAL | Status: DC

## 2020-10-23 MED ORDER — GABAPENTIN 300 MG PO CAPS
300.0000 mg | ORAL_CAPSULE | Freq: Three times a day (TID) | ORAL | Status: DC
  Administered 2020-10-23 – 2020-10-26 (×8): 300 mg via ORAL
  Filled 2020-10-23 (×8): qty 1

## 2020-10-23 MED ORDER — TRAMADOL HCL 50 MG PO TABS: 50.0000 mg | ORAL_TABLET | Freq: Every day | ORAL | Status: DC | PRN

## 2020-10-23 MED ORDER — MAGNESIUM SULFATE 2 GM/50ML IV SOLN
2.0000 g | Freq: Once | INTRAVENOUS | Status: AC
  Administered 2020-10-23: 2 g via INTRAVENOUS
  Filled 2020-10-23: qty 50

## 2020-10-23 MED ORDER — POLYETHYLENE GLYCOL 3350 17 G PO PACK: 8.5000 g | PACK | Freq: Every day | ORAL | Status: DC | PRN

## 2020-10-23 MED ORDER — SODIUM CHLORIDE 0.9 % IV SOLN: 250.0000 mL | INTRAVENOUS | Status: DC | PRN

## 2020-10-23 MED ORDER — BIOTENE DRY MOUTH MT LIQD
15.0000 mL | Freq: Every day | OROMUCOSAL | Status: DC
  Administered 2020-10-23: 15 mL via OROMUCOSAL

## 2020-10-23 MED ORDER — DOCUSATE SODIUM 100 MG PO CAPS: 100.0000 mg | ORAL_CAPSULE | Freq: Every day | ORAL | Status: DC

## 2020-10-23 MED ORDER — PANTOPRAZOLE SODIUM 40 MG PO TBEC
40.0000 mg | DELAYED_RELEASE_TABLET | Freq: Every day | ORAL | Status: DC
  Administered 2020-10-24 – 2020-10-26 (×3): 40 mg via ORAL
  Filled 2020-10-23 (×3): qty 1

## 2020-10-23 MED ORDER — SODIUM CHLORIDE 0.9% FLUSH
3.0000 mL | Freq: Two times a day (BID) | INTRAVENOUS | Status: DC
  Administered 2020-10-23 – 2020-10-25 (×5): 3 mL via INTRAVENOUS

## 2020-10-23 NOTE — Patient Instructions (Signed)
Testing:  Your physician has requested that you have an echocardiogram. Echocardiography is a painless test that uses sound waves to create images of your heart. It provides your doctor with information about the size and shape of your heart and how well your heart's chambers and valves are working. This procedure takes approximately one hour. There are no restrictions for this procedure. [place] UP APPOINTMENT  Follow-Up: At [facility], you and your health needs are our priority.  As part of our continuing mission to provide you with exceptional heart care, we have created designated Provider Care Teams.  These Care Teams include your primary Cardiologist (physician) and Advanced Practice Providers (APPs -  Physician Assistants and Nurse Practitioners) who all work together to provide you with the care you need, when you need it.  We recommend signing up for the patient portal called "MyChart".  Sign up information is provided on this After Visit Summary.  MyChart is used to connect with patients for Virtual Visits (Telemedicine).  Patients are able to view lab/test results, encounter notes, upcoming appointments, etc.  Non-urgent messages can be sent to your provider as well.   To learn more about what you can do with MyChart, go to [email or web address].    Your next appointment:   3 month(s)  The format for your next appointment:   In Person  Provider:   Kirk Ruths, MD

## 2020-10-23 NOTE — Progress Notes (Signed)
Pharmacy: Dofetilide (Tikosyn) - Initial Consult Assessment and Electrolyte Replacement  Pharmacy consulted to assist in monitoring and replacing electrolytes in this 70 y.o. female admitted on 10/23/2020 undergoing dofetilide initiation. First dofetilide dose: 10/23/20  Assessment:  Patient Exclusion Criteria: If any screening criteria checked as "Yes", then  patient  should NOT receive dofetilide until criteria item is corrected.  If "Yes" please indicate correction plan.  YES  NO Patient  Exclusion Criteria Correction Plan   '[]'$   '[x]'$   Baseline QTc interval is greater than or equal to 440 msec. IF above YES box checked dofetilide contraindicated unless patient has ICD; then may proceed if QTc 500-550 msec or with known ventricular conduction abnormalities may proceed with QTc 550-600 msec. QTc =      '[]'$   '[x]'$   Patient is known or suspected to have a digoxin level greater than 2 ng/ml: No results found for: DIGOXIN     '[]'$   '[x]'$   Creatinine clearance less than 20 ml/min (calculated using Cockcroft-Gault, actual body weight and serum creatinine): Estimated Creatinine Clearance: 38.7 mL/min (A) (by C-G formula based on SCr of 1.45 mg/dL (H)).     '[]'$   '[x]'$  Patient has received drugs known to prolong the QT intervals within the last 48 hours (phenothiazines, tricyclics or tetracyclic antidepressants, erythromycin, H-1 antihistamines, cisapride, fluoroquinolones, azithromycin, ondansetron).   Updated information on QT prolonging agents is available to be searched on the following database:QT prolonging agents  Lexapro changed to Paxil, Elavil stopped   '[]'$   '[x]'$   Patient received a dose of hydrochlorothiazide (Oretic) alone or in any combination including triamterene (Dyazide, Maxzide) in the last 48 hours.    '[]'$   '[x]'$  Patient received a medication known to increase dofetilide plasma concentrations prior to initial dofetilide dose:  Trimethoprim (Primsol, Proloprim) in the last 36  hours Verapamil (Calan, Verelan) in the last 36 hours or a sustained release dose in the last 72 hours Megestrol (Megace) in the last 5 days  Cimetidine (Tagamet) in the last 6 hours Ketoconazole (Nizoral) in the last 24 hours Itraconazole (Sporanox) in the last 48 hours  Prochlorperazine (Compazine) in the last 36 hours     '[]'$   '[x]'$   Patient is known to have a history of torsades de pointes; congenital or acquired long QT syndromes.    '[]'$   '[x]'$   Patient has received a Class 1 antiarrhythmic with less than 2 half-lives since last dose. (Disopyramide, Quinidine, Procainamide, Lidocaine, Mexiletine, Flecainide, Propafenone)    '[]'$   '[x]'$   Patient has received amiodarone therapy in the past 3 months or amiodarone level is greater than 0.3 ng/ml.    Patient has been appropriately anticoagulated with apixaban '5mg'$  BID.  Labs:    Component Value Date/Time   K 4.2 10/23/2020 1130   K 4.5 12/14/2014 0000   MG 1.8 10/23/2020 1130     Plan: Potassium: K >/= 4: Appropriate to initiate Tikosyn, no replacement needed    Magnesium: Mg 1.8-2: Give Mg 2 gm IV x1 to prevent Mg from dropping below 1.8 - do not need to recheck Mg. Appropriate to initiate Tikosyn   Thank you for allowing pharmacy to participate in this patient's care   Arrie Senate, PharmD, BCPS, [facility] Clinical Pharmacist [phone] Please check AMION for all [place] numbers 10/23/2020

## 2020-10-23 NOTE — Plan of Care (Signed)
  Problem: Coping: Goal: Level of anxiety will decrease Outcome: Progressing   Problem: Elimination: Goal: Will not experience complications related to urinary retention Outcome: Progressing   

## 2020-10-23 NOTE — Plan of Care (Signed)

## 2020-10-23 NOTE — Progress Notes (Signed)
Primary Care Physician: Ria Bush, MD Referring Physician: Dr. Stanford Breed Primary EP: Dr Greig Right Alexandria Leach is a 70 y.o. female with a h/o new onset typical atrial flutter in April of this year. H/o of rheumatic heart disease with remote repair pf mitral valve in 1980, CHF, CKD.  She was placed on anticoagulation and had a succession CV but ERAfl. She felt much improved while in SR. She is now in the afib clinic to discuss options to regain SR.  Weight indicates she ius up 6 lbs of fluid and pt started back on her prn lasix yesterday.   Follow up in the AF clinic 10/23/20. Patient presents for dofetilide admission. She remains in rate controlled atrial flutter. She denies any missed doses of anticoagulation in the last 3 weeks.   Today, she denies symptoms of palpitations, chest pain, shortness of breath, orthopnea, PND, lower extremity edema, dizziness, presyncope, syncope, or neurologic sequela. The patient is tolerating medications without difficulties and is otherwise without complaint today.   Past Medical History:  Diagnosis Date   Ankle fracture, left 02/19/2015   from a fall   Anxiety    Cataract 2014   corrected with surgery   CHF (congestive heart failure) (HCC)    CKD (chronic kidney disease) stage 3, GFR 30-59 ml/min [facility])    saw nephrologist Dr Harden Mo   DDD (degenerative disc disease), cervical    Depression    Fibromyalgia    Glaucoma    s/p surgery, sees ophtho Q6 mo   History of DVT (deep vein thrombosis) several times latest 2012   receives coumadin while hospitalized   History of kidney stones 2010   History of pulmonary embolism 2001, 2006   completed coumadin courses   History of rheumatic fever x3   HLD (hyperlipidemia)    HTN (hypertension)    Insomnia    Lung nodule 09/22/2012   RLL - 53m, stable since 2014. Thought benign.    Osteoarthritis    shoulders and knees, not RA per Dr DEstanislado Pandy positive ANA, positive Ro   Osteopenia 09/18/2015    DEXA T -1.1 hip, -0.2 spine 08/2015    Personal history of urinary calculi latest 2014   Pneumonia 12/02/2011   PONV (postoperative nausea and vomiting)    Refusal of blood transfusions as patient is Jehovah's Witness    Rheumatic heart disease 1980   s/p mitral valve repair 1980   Sjogren's syndrome ([facility]    Trimalleolar fracture of left ankle 02/23/2015   Past Surgical History:  Procedure Laterality Date   BREAST BIOPSY Right 2006   benign   CARDIOVERSION N/A 07/18/2020   Procedure: CARDIOVERSION;  Surgeon: AElouise Munroe MD;  [facility]  Service: Cardiovascular;  Laterality: N/A;   CHOLECYSTECTOMY  11/27/2011   Procedure: LAPAROSCOPIC CHOLECYSTECTOMY WITH INTRAOPERATIVE CHOLANGIOGRAM;  Surgeon: HAdin Hector MD;  [place]  Service: General;  Laterality: N/A;  laparoscopic cholecystectomy with choleangiogram umbilical hernia repair   COLONOSCOPY  07/2014   WNL ([facility]   COLONOSCOPY  08/2019   3 TAs, rpt 3 yrs ([place])   dexa  08/2012   normal per patient - no records available   ESOPHAGOGASTRODUODENOSCOPY  08/2019   reactive gastropathy, neg H pylori ([facility]   EYE SURGERY Bilateral 2014   cataract removal   MITRAL VALVE REPAIR  1980   open heart   ORIF ANKLE FRACTURE Left 02/26/2015   Procedure: OPEN REDUCTION INTERNAL FIXATION (ORIF) LEFT TRIMALLEOLAR ANKLE FRACTURE;  Surgeon: [name], MD;  Location: [place];  Service: Orthopedics;  Laterality: Left;   TUBAL LIGATION  [id number]   UMBILICAL HERNIA REPAIR  11/27/2011   Procedure: HERNIA REPAIR UMBILICAL ADULT;  Surgeon: Adin Hector, MD;  Location: [place];  Service: General;  Laterality: N/A;   [place]   for fibroids -- partial, ovaries remain    Current Outpatient Medications  Medication Sig Dispense Refill   ALPRAZolam (XANAX) 0.5 MG tablet Take 1 tablet (0.5 mg total) by mouth 2 (two) times daily. 180 tablet 2   antiseptic oral rinse (BIOTENE) LIQD 15 mLs by Mouth  Rinse route at bedtime.     apixaban (ELIQUIS) 5 MG TABS tablet TAKE 1 TABLET BY MOUTH  TWICE DAILY 180 tablet 1   cholecalciferol (VITAMIN D) 1000 UNITS tablet Take 1,000 Units by mouth daily.     Co-Enzyme Q-10 100 MG CAPS Take 1 capsule (100 mg total) by mouth daily.  0   diclofenac sodium (VOLTAREN) 1 % GEL Apply 1 application topically 2 (two) times daily as needed (pain).     diltiazem (CARDIZEM CD) 360 MG 24 hr capsule Take 1 capsule (360 mg total) by mouth daily. 90 capsule 3   docusate sodium (COLACE) 100 MG capsule Take 1 capsule (100 mg total) by mouth daily.     ezetimibe (ZETIA) 10 MG tablet Take 1 tablet (10 mg total) by mouth at bedtime. 90 tablet 3   furosemide (LASIX) 20 MG tablet TAKE 1 TABLET BY MOUTH  DAILY AS NEEDED FOR EDEMA 90 tablet 3   gabapentin (NEURONTIN) 300 MG capsule Take 2 capsules every morning, 1 capsule every afternoon and 2 capsules at bedtime 450 capsule 3   losartan (COZAAR) 50 MG tablet Take 1 tablet (50 mg total) by mouth daily. 90 tablet 3   lovastatin (MEVACOR) 10 MG tablet TAKE 1 TABLET BY MOUTH  EVERY MONDAY, WEDNESDAY,  AND FRIDAY (Patient taking differently: No sig reported) 39 tablet 3   metoprolol tartrate (LOPRESSOR) 50 MG tablet Take 1.5 tablets (75 mg total) by mouth 2 (two) times daily. 270 tablet 3   pantoprazole (PROTONIX) 40 MG tablet Take 40 mg by mouth daily.     PARoxetine (PAXIL) 30 MG tablet Take 1 tablet (30 mg total) by mouth at bedtime. 30 tablet 2   Polyethyl Glycol-Propyl Glycol (SYSTANE) 0.4-0.3 % GEL ophthalmic gel Place 1 drop into both eyes 2 (two) times daily.      polyethylene glycol powder (GLYCOLAX/MIRALAX) 17 GM/SCOOP powder Take 8.5-17 g by mouth daily as needed for moderate constipation. (1/2-1 capful daily as needed) 3350 g 1   RESTASIS 0.05 % ophthalmic emulsion INSTILL ONE DROP IN BOTH  EYES TWO TIMES DAILY 180 each 0   traMADol (ULTRAM) 50 MG tablet Take 1 tablet (50 mg total) by mouth daily as needed. 30 tablet 0    No current facility-administered medications for this encounter.    Allergies  Allergen Reactions   Cymbalta [Duloxetine Hcl] Other (See Comments)    tachycardia   Statins Nausea Only and Other (See Comments)    Muscle cramps also   Sulfa Antibiotics Nausea And Vomiting    Social History   Socioeconomic History   Marital status: Married    Spouse name: [name]   Number of children: 4   Years of education: 12   Highest education level: Not on file  Occupational History    Employer: UNEMPLOYED    Comment: Disability  Tobacco  Use   Smoking status: Never   Smokeless tobacco: Never  Vaping Use   Vaping Use: Never used  Substance and Sexual Activity   Alcohol use: No    Alcohol/week: 0.0 standard drinks   Drug use: No   Sexual activity: Not Currently    Birth control/protection: Surgical  Other Topics Concern   Not on file  Social History Narrative   Lives with son, 1 dog   Occupation: unemployed, on disability for fibromyalgia since 2008.   Edu: HS   Religion: Jehova's witness   Activity: volunteers at senior center   Diet: some water, fruits/vegetables daily   No caffeine use   Social Determinants of Health   Financial Resource Strain: Not on file  Food Insecurity: Not on file  Transportation Needs: Not on file  Physical Activity: Not on file  Stress: Not on file  Social Connections: Not on file  Intimate Partner Violence: Not on file    Family History  Problem Relation Age of Onset   Lupus Sister        and niece   Cancer Mother        lung (nonsmoker)   CAD Mother        MI in her 15s   ALS Mother    Kidney disease Father    Alcohol abuse Father    Diabetes Father    Cancer Brother        bone   Diabetes Sister    Stroke Sister    Cancer Maternal Uncle        bone   Depression Sister    Lupus Sister    Kidney failure Other        on HD   Diabetes Brother    Heart attack Brother    Stroke Maternal Grandmother    Blindness Sister     Healthy Son    Healthy Son    Healthy Son    Healthy Son     ROS- All systems are reviewed and negative except as per the HPI above  Physical Exam: Vitals:   10/23/20 1115  BP: (!) 142/88  Pulse: (!) 57  Weight: 93.7 kg  Height: '5\' 4"'$  (1.626 m)   Wt Readings from Last 3 Encounters:  10/23/20 93.7 kg  10/23/20 93 kg  10/06/20 92.1 kg    Labs: Lab Results  Component Value Date   NA 139 07/18/2020   K 4.5 07/18/2020   CL 110 07/18/2020   CO2 27 07/11/2020   GLUCOSE 96 07/18/2020   BUN 12 07/18/2020   CREATININE 1.30 (H) 07/18/2020   CALCIUM 9.8 07/11/2020   PHOS 3.2 07/11/2020   MG 1.8 04/05/2010   Lab Results  Component Value Date   INR 0.93 10/12/2015   Lab Results  Component Value Date   CHOL 167 05/29/2020   HDL 71 05/29/2020   Why 82 05/29/2020   TRIG 75 05/29/2020    GEN- The patient is a well appearing obese female, alert and oriented x 3 today.   HEENT-head normocephalic, atraumatic, sclera clear, conjunctiva pink, hearing intact, trachea midline. Lungs- Clear to ausculation bilaterally, normal work of breathing Heart- Regular rate and rhythm, no murmurs, rubs or gallops  GI- soft, NT, ND, + BS Extremities- no clubbing, cyanosis, or edema MS- no significant deformity or atrophy Skin- no rash or lesion Psych- euthymic mood, full affect Neuro- strength and sensation are intact   EKG- atrial flutter with 4:1 block Vent. rate 57 BPM PR  interval * ms QRS duration 82 ms QT/QTcB 388/377 ms   Assessment and Plan:  1. Atrial flutter Patient presents for dofetilide admission. Patient aware of price of dofetilide. Continue Eliquis 5 mg BID, states no missed doses in the last 3 weeks. No recent benadryl use PharmD has screened medications. Antidepressants changed to Paxil.  QTc in SR 454 ms Labs today show creatinine at 1.45, K+ 4.2 and mag 1.8, CrCl calculated at 55 mL/min Continue diltiazem 360 mg daily Continue Lopressor 75 mg BID  2.  HFpEF  No signs or symptoms of fluid overload today. She does take Lasix as needed. Could consider checking BNP during admission to see if she would be an Alleviated HF candidate.   3. HTN Stable, no changes today.   To be admitted later today once a bed becomes available.   [facility] [address] [place], [place] [address] [phone]

## 2020-10-24 DIAGNOSIS — I484 Atypical atrial flutter: Secondary | ICD-10-CM | POA: Diagnosis not present

## 2020-10-24 LAB — BASIC METABOLIC PANEL
Anion gap: 9 (ref 5–15)
BUN: 11 mg/dL (ref 8–23)
CO2: 22 mmol/L (ref 22–32)
Calcium: 9.2 mg/dL (ref 8.9–10.3)
Chloride: 106 mmol/L (ref 98–111)
Creatinine, Ser: 1.19 mg/dL — ABNORMAL HIGH (ref 0.44–1.00)
GFR, Estimated: 50 mL/min — ABNORMAL LOW (ref >60)
Glucose, Bld: 112 mg/dL — ABNORMAL HIGH (ref 70–99)
Potassium: 3.9 mmol/L (ref 3.5–5.1)
Sodium: 137 mmol/L (ref 135–145)

## 2020-10-24 LAB — MAGNESIUM: Magnesium: 2.1 mg/dL (ref 1.7–2.4)

## 2020-10-24 MED ORDER — POTASSIUM CHLORIDE CRYS ER 20 MEQ PO TBCR
40.0000 meq | EXTENDED_RELEASE_TABLET | Freq: Once | ORAL | Status: AC
  Administered 2020-10-24: 40 meq via ORAL
  Filled 2020-10-24: qty 2

## 2020-10-24 NOTE — Progress Notes (Addendum)
Progress Note  Patient Name: Alexandria Leach Date of Encounter: 10/24/2020  [facility] Cardiologist: Kirk Ruths, MD   Subjective   She can tell she is back in SR, felt well this AM ambulating in there orom without SOB  Inpatient Medications    Scheduled Meds:  ALPRAZolam  0.5 mg Oral BID   antiseptic oral rinse  15 mL Mouth Rinse QHS   apixaban  5 mg Oral BID   cholecalciferol  1,000 Units Oral Daily   cycloSPORINE  1 drop Both Eyes BID   diltiazem  360 mg Oral Daily   dofetilide  250 mcg Oral BID   ezetimibe  10 mg Oral QHS   gabapentin  300 mg Oral TID   losartan  50 mg Oral Daily   metoprolol tartrate  75 mg Oral BID   pantoprazole  40 mg Oral Daily   PARoxetine  30 mg Oral QHS   pravastatin  10 mg Oral Once per day on Mon Wed Fri   sodium chloride flush  3 mL Intravenous Q12H   Continuous Infusions:  sodium chloride     PRN Meds: sodium chloride, polyethylene glycol, sodium chloride flush, traMADol   Vital Signs    Vitals:   10/23/20 2241 10/23/20 2250 10/23/20 2354 10/24/20 0418  BP: (!) 167/92 (!) 167/92 (!) 168/83 (!) 141/66  Pulse: 66  60 66  Resp:   18 18  Temp:   98 F (36.7 C) 98.4 F (36.9 C)  TempSrc:   Oral Oral  SpO2:   96% 97%  Weight:      Height:       No intake or output data in the 24 hours ending 10/24/20 0854 Last 3 Weights 10/23/2020 10/23/2020 10/23/2020  Weight (lbs) 206 lb 4.8 oz 206 lb 9.6 oz 205 lb  Weight (kg) 93.577 kg 93.713 kg 92.987 kg  Some encounter information is confidential and restricted. Go to Review Flowsheets activity to see all data.      Telemetry    AFlutter with CVR >> SR 60's-70's - Personally Reviewed  ECG    AFlutter 71bpm, QTc 473m - Personally Reviewed  Physical Exam   GEN: No acute distress.   Neck: No JVD Cardiac: RRR, no murmurs, rubs, or gallops.  Respiratory: CTA b/l GI: Soft, nontender, non-distended  MS: No edema; No deformity. Neuro:  Nonfocal  Psych: Normal affect   Labs     High Sensitivity Troponin:  No results for input(s): TROPONINIHS in the last 720 hours.    Chemistry Recent Labs  Lab 10/23/20 1130 10/24/20 0103  NA 136 137  K 4.2 3.9  CL 107 106  CO2 22 22  GLUCOSE 104* 112*  BUN 12 11  CREATININE 1.45* 1.19*  CALCIUM 9.5 9.2  GFRNONAA 39* 50*  ANIONGAP 7 9     HematologyNo results for input(s): WBC, RBC, HGB, HCT, MCV, MCH, MCHC, RDW, PLT in the last 168 hours.  BNPNo results for input(s): BNP, PROBNP in the last 168 hours.   DDimer No results for input(s): DDIMER in the last 168 hours.   Radiology    No results found.  Cardiac Studies   11/03/19: TTE IMPRESSIONS   1. Left ventricular ejection fraction, by estimation, is 60 to 65%. The  left ventricle has normal function. The left ventricle has no regional  wall motion abnormalities. There is mild asymmetric left ventricular  hypertrophy of the basal-septal segment.  Left ventricular diastolic parameters are consistent with Grade II  diastolic dysfunction (pseudonormalization). Elevated left ventricular  end-diastolic pressure.   2. Right ventricular systolic function is normal. The right ventricular  size is normal. There is moderately elevated pulmonary artery systolic  pressure.   3. Left atrial size was severely dilated.   4. Heart rate 78 bpm. Compared with the echo 10/2018, mean gradient has  increased from 8 mmHg to 10 mmHg. Pressure half time is unchanged. Valve  area by pressure half time (102 ms) is 2.16 cm^2. The mitral valve is  rheumatic. Moderate mitral valve  regurgitation. Moderate mitral stenosis. The mean mitral valve gradient is  10.0 mmHg.   5. The aortic valve is tricuspid. Aortic valve regurgitation is mild.  Mild aortic valve stenosis. Aortic valve area, by VTI measures 1.10 cm.  Aortic valve mean gradient measures 11.0 mmHg. Aortic valve Vmax measures  2.23 m/s.   6. The inferior vena cava is normal in size with greater than 50%  respiratory  variability, suggesting right atrial pressure of 3 mmHg.   Patient Profile     70 y.o. female Rheumatic heart disease w/hx of MV repair remotely, CKD (III), atypical AFlutter , HTN, hx of DVT, HFpEF, admitted for Tikosyn initiation  Assessment & Plan    Atypical AFlutter, rate controlled CHA2DS2Vasc is 6, on Eliquis, appropriately dosed Tikosyn load in progress K+ 3.9 Mag 2.1 Creat 1.19 QTc stable post dose EKG and this AM EKGs reveiwed by Dr. Lovena Le  2. HFpEF Compensated current;y  3. HTN Home meds  4. CKD stable  For questions or updates, please contact [place] Please consult [email or web address] for contact info under   Signed, Baldwin Jamaica, PA-C  10/24/2020, 8:54 AM    EP Attending  Patient seen and examined. Agree with the findings as noted above. She has reverted back to NSR and feels better. Her QT interval is acceptable. Her exam demonstrated a RRR and clear lungs. No edema. She will continue dofetilide 250 mcg q12. Expect DC home on 10/26/20.    Royston Sinner Cherylin Waguespack,MD

## 2020-10-24 NOTE — Plan of Care (Signed)

## 2020-10-24 NOTE — H&P (Signed)
Primary Care Physician: Ria Bush, MD Referring Physician: Dr. Stanford Breed Primary EP: Dr Greig Right Alexandria Leach is a 70 y.o. female with a h/o new onset typical atrial flutter in April of this year. H/o of rheumatic heart disease with remote repair pf mitral valve in 1980, CHF, CKD.  She was placed on anticoagulation and had a succession CV but ERAfl. She felt much improved while in SR. She is now in the afib clinic to discuss options to regain SR.  Weight indicates she ius up 6 lbs of fluid and pt started back on her prn lasix yesterday.   Follow up in the AF clinic 10/23/20. Patient presents for dofetilide admission. She remains in rate controlled atrial flutter. She denies any missed doses of anticoagulation in the last 3 weeks.   Today, she denies symptoms of palpitations, chest pain, shortness of breath, orthopnea, PND, lower extremity edema, dizziness, presyncope, syncope, or neurologic sequela. The patient is tolerating medications without difficulties and is otherwise without complaint today.   Past Medical History:  Diagnosis Date   Ankle fracture, left 02/19/2015   from a fall   Anxiety    Cataract 2014   corrected with surgery   CHF (congestive heart failure) (HCC)    CKD (chronic kidney disease) stage 3, GFR 30-59 ml/min [facility])    saw nephrologist Dr Harden Mo   DDD (degenerative disc disease), cervical    Depression    Fibromyalgia    Glaucoma    s/p surgery, sees ophtho Q6 mo   History of DVT (deep vein thrombosis) several times latest 2012   receives coumadin while hospitalized   History of kidney stones 2010   History of pulmonary embolism 2001, 2006   completed coumadin courses   History of rheumatic fever x3   HLD (hyperlipidemia)    HTN (hypertension)    Insomnia    Lung nodule 09/22/2012   RLL - 58m, stable since 2014. Thought benign.    Osteoarthritis    shoulders and knees, not RA per Dr DEstanislado Pandy positive ANA, positive Ro   Osteopenia 09/18/2015    DEXA T -1.1 hip, -0.2 spine 08/2015    Personal history of urinary calculi latest 2014   Pneumonia 12/02/2011   PONV (postoperative nausea and vomiting)    Refusal of blood transfusions as patient is Jehovah's Witness    Rheumatic heart disease 1980   s/p mitral valve repair 1980   [facility]ren's syndrome ([facility]    Trimalleolar fracture of left ankle 02/23/2015   Past Surgical History:  Procedure Laterality Date   BREAST BIOPSY Right 2006   benign   CARDIOVERSION N/A 07/18/2020   Procedure: CARDIOVERSION;  Surgeon: AElouise Munroe MD;  [facility]  Service: Cardiovascular;  Laterality: N/A;   CHOLECYSTECTOMY  11/27/2011   Procedure: LAPAROSCOPIC CHOLECYSTECTOMY WITH INTRAOPERATIVE CHOLANGIOGRAM;  Surgeon: HAdin Hector MD;  [facility] [facility]  Service: General;  Laterality: N/A;  laparoscopic cholecystectomy with choleangiogram umbilical hernia repair   COLONOSCOPY  07/2014   WNL ([facility]o [facility]   COLONOSCOPY  08/2019   3 TAs, rpt 3 yrs ([place])   dexa  08/2012   normal per patient - no records available   ESOPHAGOGASTRODUODENOSCOPY  08/2019   reactive gastropathy, neg H pylori ([facility]   EYE SURGERY Bilateral 2014   cataract removal   MITRAL VALVE REPAIR  1980   open heart   ORIF ANKLE FRACTURE Left 02/26/2015   Procedure: OPEN REDUCTION INTERNAL FIXATION (ORIF) LEFT TRIMALLEOLAR ANKLE FRACTURE;  Surgeon: [name], MD;  Location: [facility];  Service: Orthopedics;  Laterality: Left;   TUBAL LIGATION  [id number]   UMBILICAL HERNIA REPAIR  11/27/2011   Procedure: HERNIA REPAIR UMBILICAL ADULT;  Surgeon: Adin Hector, MD;  Location: [facility];  Service: General;  Laterality: N/A;   VAGINAL HYSTERECTOMY  1992   for fibroids -- partial, ovaries remain    Current Facility-Administered Medications  Medication Dose Route Frequency Provider Last Rate Last Admin   0.9 %  sodium chloride infusion  250 mL Intravenous PRN Fenton, Clint R, PA       ALPRAZolam Duanne Moron) tablet 0.5 mg  0.5 mg  Oral BID Fenton, Clint R, PA   0.5 mg at 10/24/20 2108   antiseptic oral rinse (BIOTENE) solution 15 mL  15 mL Mouth Rinse QHS Fenton, Clint R, PA   15 mL at 10/23/20 2241   apixaban (ELIQUIS) tablet 5 mg  5 mg Oral BID Fenton, Clint R, PA   5 mg at 10/24/20 2108   cholecalciferol (VITAMIN D3) tablet 1,000 Units  1,000 Units Oral Daily Fenton, Clint R, PA   1,000 Units at 10/24/20 [id number]   cycloSPORINE (RESTASIS) 0.05 % ophthalmic emulsion 1 drop  1 drop Both Eyes BID Fenton, Clint R, PA   1 drop at 10/24/20 2110   diltiazem (CARDIZEM CD) 24 hr capsule 360 mg  360 mg Oral Daily Fenton, Clint R, PA   360 mg at 10/24/20 [id number]   dofetilide (TIKOSYN) capsule 250 mcg  250 mcg Oral BID Fenton, Clint R, PA   250 mcg at 10/24/20 2002   ezetimibe (ZETIA) tablet 10 mg  10 mg Oral QHS Fenton, Clint R, PA   10 mg at 10/24/20 2108   gabapentin (NEURONTIN) capsule 300 mg  300 mg Oral TID Fenton, Clint R, PA   300 mg at 10/24/20 2107   losartan (COZAAR) tablet 50 mg  50 mg Oral Daily Fenton, Clint R, PA   50 mg at 10/24/20 0821   metoprolol tartrate (LOPRESSOR) tablet 75 mg  75 mg Oral BID Fenton, Clint R, PA   75 mg at 10/24/20 2108   pantoprazole (PROTONIX) EC tablet 40 mg  40 mg Oral Daily Fenton, Clint R, PA   40 mg at 10/24/20 0820   PARoxetine (PAXIL) tablet 30 mg  30 mg Oral QHS Fenton, Clint R, PA   30 mg at 10/24/20 2108   polyethylene glycol (MIRALAX / GLYCOLAX) packet 8.5-17 g  8.5-17 g Oral Daily PRN Fenton, Clint R, PA       pravastatin (PRAVACHOL) tablet 10 mg  10 mg Oral Once per day on Mon Wed Fri Fenton, [place] R, PA   10 mg at 10/23/20 2245   sodium chloride flush (NS) 0.9 % injection 3 mL  3 mL Intravenous Q12H Fenton, Clint R, PA   3 mL at 10/24/20 2110   sodium chloride flush (NS) 0.9 % injection 3 mL  3 mL Intravenous PRN Fenton, Clint R, PA       traMADol (ULTRAM) tablet 50 mg  50 mg Oral Daily PRN Fenton, Clint R, PA        Allergies  Allergen Reactions   Cymbalta [Duloxetine Hcl] Other  (See Comments)    tachycardia   Statins Nausea Only and Other (See Comments)    Muscle cramps also   Sulfa Antibiotics Nausea And Vomiting    Social History   Socioeconomic History   Marital status: Married    Spouse  name: [name]   Number of children: 4   Years of education: 12   Highest education level: Not on file  Occupational History    Employer: UNEMPLOYED    Comment: Disability  Tobacco Use   Smoking status: Never   Smokeless tobacco: Never  Vaping Use   Vaping Use: Never used  Substance and Sexual Activity   Alcohol use: No    Alcohol/week: 0.0 standard drinks   Drug use: No   Sexual activity: Not Currently    Birth control/protection: Surgical  Other Topics Concern   Not on file  Social History Narrative   Lives with son, 1 dog   Occupation: unemployed, on disability for fibromyalgia since 2008.   Edu: HS   Religion: Jehova's witness   Activity: volunteers at senior center   Diet: some water, fruits/vegetables daily   No caffeine use   Social Determinants of Health   Financial Resource Strain: Not on file  Food Insecurity: Not on file  Transportation Needs: Not on file  Physical Activity: Not on file  Stress: Not on file  Social Connections: Not on file  Intimate Partner Violence: Not on file    Family History  Problem Relation Age of Onset   Lupus Sister        and niece   Cancer Mother        lung (nonsmoker)   CAD Mother        MI in her 75s   ALS Mother    Kidney disease Father    Alcohol abuse Father    Diabetes Father    Cancer Brother        bone   Diabetes Sister    Stroke Sister    Cancer Maternal Uncle        bone   Depression Sister    Lupus Sister    Kidney failure Other        on HD   Diabetes Brother    Heart attack Brother    Stroke Maternal Grandmother    Blindness Sister    Healthy Son    Healthy Son    Healthy Son    Healthy Son     ROS- All systems are reviewed and negative except as per the HPI  above  Physical Exam: Vitals:   10/24/20 0418 10/24/20 1611 10/24/20 2004 10/24/20 2108  BP: (!) 141/66 (!) 138/94 (!) 183/78 (!) 159/81  Pulse: 66 68 83 83  Resp: '18 18 20   '$ Temp: 98.4 F (36.9 C) 98.1 F (36.7 C) 99.2 F (37.3 C)   TempSrc: Oral Oral Oral   SpO2: 97% 97% 95%   Weight:      Height:       Wt Readings from Last 3 Encounters:  10/23/20 93.6 kg  10/23/20 93.7 kg  10/23/20 93 kg    Labs: Lab Results  Component Value Date   NA 137 10/24/2020   K 3.9 10/24/2020   CL 106 10/24/2020   CO2 22 10/24/2020   GLUCOSE 112 (H) 10/24/2020   BUN 11 10/24/2020   CREATININE 1.19 (H) 10/24/2020   CALCIUM 9.2 10/24/2020   PHOS 3.2 07/11/2020   MG 2.1 10/24/2020   Lab Results  Component Value Date   INR 0.93 10/12/2015   Lab Results  Component Value Date   CHOL 167 05/29/2020   HDL 71 05/29/2020   LDLCALC 82 05/29/2020   TRIG 75 05/29/2020    GEN- The patient is a well appearing obese  female, alert and oriented x 3 today.   HEENT-head normocephalic, atraumatic, sclera clear, conjunctiva pink, hearing intact, trachea midline. Lungs- Clear to ausculation bilaterally, normal work of breathing Heart- Regular rate and rhythm, no murmurs, rubs or gallops  GI- soft, NT, ND, + BS Extremities- no clubbing, cyanosis, or edema MS- no significant deformity or atrophy Skin- no rash or lesion Psych- euthymic mood, full affect Neuro- strength and sensation are intact   EKG- atrial flutter with 4:1 block Vent. rate 57 BPM PR interval * ms QRS duration 82 ms QT/QTcB 388/377 ms   Assessment and Plan:  1. Atrial flutter Patient presents for dofetilide admission. Patient aware of price of dofetilide. Continue Eliquis 5 mg BID, states no missed doses in the last 3 weeks. No recent benadryl use PharmD has screened medications. Antidepressants changed to Paxil.  QTc in SR 454 ms Labs today show creatinine at 1.45, K+ 4.2 and mag 1.8, CrCl calculated at 55  mL/min Continue diltiazem 360 mg daily Continue Lopressor 75 mg BID  2. HFpEF  No signs or symptoms of fluid overload today. She does take Lasix as needed. Could consider checking BNP during admission to see if she would be an Alleviated HF candidate.   3. HTN Stable, no changes today.   To be admitted later today once a bed becomes available.   [facility] [address] [place], [place] [address] [phone]   EP Attending  Patient seen and examined. Agree with the findings as noted above. She is stable for admit for initiation of dofetilide. Her QT is difficult to discern while she is out of rhythm but previously was acceptable for initiation. We will start dofetilide 250 mcg q12. Follow QT intervals and electrolytes and renal function.   Carleene Overlie Genowefa Morga,MD

## 2020-10-24 NOTE — Care Management (Signed)
1024 10-24-20 Patient presented for Tikosyn Load. Patient is dually covered. Patient usually has a zero dollar co pay. Tikosyn should be no more than $3.00 if there is any cost. Patient states she uses [place] and [employer] locally and [facility] order. Patient wants the initial Rx filled via [place] and Rx refills sent to [facility] for Mail order. No further needs identified at this time.

## 2020-10-24 NOTE — Progress Notes (Signed)
Pharmacy: Dofetilide (Tikosyn) - Follow Up Assessment and Electrolyte Replacement  Pharmacy consulted to assist in monitoring and replacing electrolytes in this 70 y.o. female admitted on 10/23/2020 undergoing dofetilide initiation.   Labs:    Component Value Date/Time   K 3.9 10/24/2020 0103   K 4.5 12/14/2014 0000   MG 2.1 10/24/2020 0103     Plan: Potassium: -potassium 59mq given  Magnesium: Mg > 2: No additional supplementation needed    Thank you for allowing pharmacy to participate in this patient's care   AHildred Laser PharmD Clinical Pharmacist **Pharmacist phone directory can now be found on [email or web address] (PW [email or web address]).  Listed under [place]

## 2020-10-25 ENCOUNTER — Institutional Professional Consult (permissible substitution): Payer: Medicare Other | Admitting: Internal Medicine

## 2020-10-25 DIAGNOSIS — I484 Atypical atrial flutter: Secondary | ICD-10-CM | POA: Diagnosis not present

## 2020-10-25 LAB — BASIC METABOLIC PANEL
Anion gap: 7 (ref 5–15)
BUN: 11 mg/dL (ref 8–23)
CO2: 22 mmol/L (ref 22–32)
Calcium: 9.4 mg/dL (ref 8.9–10.3)
Chloride: 106 mmol/L (ref 98–111)
Creatinine, Ser: 1.14 mg/dL — ABNORMAL HIGH (ref 0.44–1.00)
GFR, Estimated: 52 mL/min — ABNORMAL LOW (ref >60)
Glucose, Bld: 102 mg/dL — ABNORMAL HIGH (ref 70–99)
Potassium: 5 mmol/L (ref 3.5–5.1)
Sodium: 135 mmol/L (ref 135–145)

## 2020-10-25 LAB — MAGNESIUM: Magnesium: 1.9 mg/dL (ref 1.7–2.4)

## 2020-10-25 MED ORDER — MAGNESIUM SULFATE IN D5W 1-5 GM/100ML-% IV SOLN
1.0000 g | Freq: Once | INTRAVENOUS | Status: AC
  Administered 2020-10-25: 1 g via INTRAVENOUS
  Filled 2020-10-25: qty 100

## 2020-10-25 NOTE — Plan of Care (Signed)

## 2020-10-25 NOTE — Progress Notes (Addendum)
Progress Note  Patient Name: Alexandria Leach Date of Encounter: 10/25/2020  [facility] Cardiologist: Alexandria Ruths, MD   Subjective   Ambulated in the halls several times yesterday felt well, last trip felt a bit SOB and had to rest, no cough, no SOB since, no symptoms of illness.  No fever  Inpatient Medications    Scheduled Meds:  ALPRAZolam  0.5 mg Oral BID   antiseptic oral rinse  15 mL Mouth Rinse QHS   apixaban  5 mg Oral BID   cholecalciferol  1,000 Units Oral Daily   cycloSPORINE  1 drop Both Eyes BID   diltiazem  360 mg Oral Daily   dofetilide  250 mcg Oral BID   ezetimibe  10 mg Oral QHS   gabapentin  300 mg Oral TID   losartan  50 mg Oral Daily   metoprolol tartrate  75 mg Oral BID   pantoprazole  40 mg Oral Daily   PARoxetine  30 mg Oral QHS   pravastatin  10 mg Oral Once per day on Mon Wed Fri   sodium chloride flush  3 mL Intravenous Q12H   Continuous Infusions:  sodium chloride     magnesium sulfate bolus IVPB     PRN Meds: sodium chloride, polyethylene glycol, sodium chloride flush, traMADol   Vital Signs    Vitals:   10/24/20 1611 10/24/20 2004 10/24/20 2108 10/25/20 0435  BP: (!) 138/94 (!) 183/78 (!) 159/81 (!) 142/64  Pulse: 68 83 83 72  Resp: '18 20  16  '$ Temp: 98.1 F (36.7 C) 99.2 F (37.3 C)  98 F (36.7 C)  TempSrc: Oral Oral  Oral  SpO2: 97% 95%  99%  Weight:      Height:        Intake/Output Summary (Last 24 hours) at 10/25/2020 0801 Last data filed at 10/24/2020 2110 Gross per 24 hour  Intake 360 ml  Output --  Net 360 ml   Last 3 Weights 10/23/2020 10/23/2020 10/23/2020  Weight (lbs) 206 lb 4.8 oz 206 lb 9.6 oz 205 lb  Weight (kg) 93.577 kg 93.713 kg 92.987 kg  Some encounter information is confidential and restricted. Go to Review Flowsheets activity to see all data.      Telemetry    SR 70's - Personally Reviewed  ECG    SR 78bpm, QTc 439m - Personally Reviewed  Physical Exam   Exam is unchanged GEN: No  acute distress.   Neck: No JVD Cardiac: RRR, no murmurs, rubs, or gallops.  Respiratory: CTA b/l, O@ sat RA is 98-99% GI: Soft, nontender, non-distended  MS: No edema; No deformity. Neuro:  Nonfocal  Psych: Normal affect   Labs    High Sensitivity Troponin:  No results for input(s): TROPONINIHS in the last 720 hours.    Chemistry Recent Labs  Lab 10/23/20 1130 10/24/20 0103 10/25/20 0208  NA 136 137 135  K 4.2 3.9 5.0  CL 107 106 106  CO2 '22 22 22  '$ GLUCOSE 104* 112* 102*  BUN '12 11 11  '$ CREATININE 1.45* 1.19* 1.14*  CALCIUM 9.5 9.2 9.4  GFRNONAA 39* 50* 52*  ANIONGAP '7 9 7     '$ HematologyNo results for input(s): WBC, RBC, HGB, HCT, MCV, MCH, MCHC, RDW, PLT in the last 168 hours.  BNPNo results for input(s): BNP, PROBNP in the last 168 hours.   DDimer No results for input(s): DDIMER in the last 168 hours.   Radiology    No results found.  Cardiac Studies   11/03/19: TTE IMPRESSIONS   1. Left ventricular ejection fraction, by estimation, is 60 to 65%. The  left ventricle has normal function. The left ventricle has no regional  wall motion abnormalities. There is mild asymmetric left ventricular  hypertrophy of the basal-septal segment.  Left ventricular diastolic parameters are consistent with Grade II  diastolic dysfunction (pseudonormalization). Elevated left ventricular  end-diastolic pressure.   2. Right ventricular systolic function is normal. The right ventricular  size is normal. There is moderately elevated pulmonary artery systolic  pressure.   3. Left atrial size was severely dilated.   4. Heart rate 78 bpm. Compared with the echo 10/2018, mean gradient has  increased from 8 mmHg to 10 mmHg. Pressure half time is unchanged. Valve  area by pressure half time (102 ms) is 2.16 cm^2. The mitral valve is  rheumatic. Moderate mitral valve  regurgitation. Moderate mitral stenosis. The mean mitral valve gradient is  10.0 mmHg.   5. The aortic valve is  tricuspid. Aortic valve regurgitation is mild.  Mild aortic valve stenosis. Aortic valve area, by VTI measures 1.10 cm.  Aortic valve mean gradient measures 11.0 mmHg. Aortic valve Vmax measures  2.23 m/s.   6. The inferior vena cava is normal in size with greater than 50%  respiratory variability, suggesting right atrial pressure of 3 mmHg.   Patient Profile     70 y.o. female Rheumatic heart disease w/hx of MV repair remotely, CKD (III), atypical AFlutter , HTN, hx of DVT, HFpEF, admitted for Tikosyn initiation  Assessment & Plan    Atypical AFlutter, rate controlled CHA2DS2Vasc is 6, on Eliquis, appropriately dosed Tikosyn load in progress K+ 5.0 Mag 1.9 (replacement ordered) Creat 1.14, stable QTc stable  Anticipate d/c tomorrow  2. HFpEF Compensated current;y  3. HTN Home meds  4. CKD stable  For questions or updates, please contact [place] Please consult [email or web address] for contact info under   Signed, Alexandria Jamaica, PA-C  10/25/2020, 8:01 AM    EP Attending  Patient seen and examined. Agree with above. She is doing well and maintaining NSR. Her QT is acceptable and electrolytes are ok. Plan to DC home tomorrow if QT ok.  Alexandria Overlie [name],MD

## 2020-10-25 NOTE — Progress Notes (Signed)
Pharmacy: Dofetilide (Tikosyn) - Follow Up Assessment and Electrolyte Replacement  Pharmacy consulted to assist in monitoring and replacing electrolytes in this 70 y.o. female admitted on 10/23/2020 undergoing dofetilide initiation.  Labs:    Component Value Date/Time   K 5.0 10/25/2020 0208   K 4.5 12/14/2014 0000   MG 1.9 10/25/2020 0208     Plan: Potassium: K >/= 4: No additional supplementation needed  Magnesium: -Mg 1gm IV has been ordered   Thank you for allowing pharmacy to participate in this patient's care   Hildred Laser, PharmD Clinical Pharmacist **Pharmacist phone directory can now be found on [email or web address] (PW [email or web address]).  Listed under [place].

## 2020-10-25 NOTE — Discharge Summary (Addendum)
ELECTROPHYSIOLOGY PROCEDURE DISCHARGE SUMMARY    Patient ID: Alexandria Leach,  MRN: [id number], DOB/AGE: 21-Jun-1950 70 y.o.  Admit date: 10/23/2020 Discharge date: 10/26/20  Primary Care Physician: Ria Bush, MD  Primary Cardiologist: Dr. Layla Maw Electrophysiologist: Dr. Lovena Le  Primary Discharge Diagnosis:  1.  ATypical atrial flutter status post Tikosyn loading this admission      CHA2DS2Vasc is 5, on Eliquis  Secondary Discharge Diagnosis:  Rheumatic Heart disease H/o MV repair (remotely) 2. CKD (III) 3. HTN 4. DVT hx 5. HFpEF  Allergies  Allergen Reactions   Cymbalta [Duloxetine Hcl] Other (See Comments)    tachycardia   Statins Nausea Only and Other (See Comments)    Muscle cramps also   Sulfa Antibiotics Nausea And Vomiting     Procedures This Admission:  1.  Tikosyn loading  Brief HPI: Alexandria Leach is a 70 y.o. female with a past medical history as noted above. She is followed by EP in the outpatient setting for her AFlutter.  Risks, benefits, and alternatives to Tikosyn were reviewed with the patient who wished to proceed.    Hospital Course:  The patient was admitted and Tikosyn was initiated.  Renal function and electrolytes were followed during the hospitalization.  The patient's QTc remained stable.  The patient converted with drug and did not require DCCV.  She was monitored until discharge on telemetry which demonstrated SR.  On the day of discharge, the patient feels well, was examined by Dr Lovena Le who considered the patient stable for discharge to home.  Follow-up has been arranged with the AFib clinic in 1 week and with EP service in 4 weeks.   Tikosyn teaching was completed She will not require any new or additional electrolyte replacement for home  [facility] Rx [facility] to [facility], refills have been sent to [facility] mail order 90 day supply  Physical Exam: Vitals:   10/25/20 0810 10/25/20 1406 10/25/20 1958 10/26/20 0405  BP:  129/65  (!) 153/73 (!) 144/55  Pulse: 79 64 83 76  Resp:  '17 18 17  '$ Temp:  98.3 F (36.8 C) 99 F (37.2 C) 98.7 F (37.1 C)  TempSrc:  Oral Oral Oral  SpO2: 93% 95% 98% 94%  Weight:      Height:         GEN- The patient is well appearing, alert and oriented x 3 today.   HEENT: normocephalic, atraumatic; sclera clear, conjunctiva pink; hearing intact; oropharynx clear; neck supple, no JVP Lymph- no cervical lymphadenopathy Lungs-  CTA b/l, normal work of breathing.  No wheezes, rales, rhonchi Heart- RRR, no murmurs, rubs or gallops, PMI not laterally displaced GI- soft, non-tender, non-distended Extremities- no clubbing, cyanosis, or edema MS- no significant deformity or atrophy Skin- warm and dry, no rash or lesion Psych- euthymic mood, full affect Neuro- strength and sensation are intact   Labs:   Lab Results  Component Value Date   WBC 4.5 12/23/2019   HGB 12.6 07/18/2020   HCT 37.0 07/18/2020   MCV 84.6 12/23/2019   PLT 287.0 12/23/2019    Recent Labs  Lab 10/26/20 0229  NA 135  K 4.5  CL 106  CO2 23  BUN 9  CREATININE 1.20*  CALCIUM 9.3  GLUCOSE 112*     Discharge Medications:  Allergies as of 10/26/2020       Reactions   Cymbalta [duloxetine Hcl] Other (See Comments)   tachycardia   Statins Nausea Only, Other (See Comments)   Muscle cramps  also   Sulfa Antibiotics Nausea And Vomiting        Medication List     TAKE these medications    ALPRAZolam 0.5 MG tablet Commonly known as: Xanax Take 1 tablet (0.5 mg total) by mouth 2 (two) times daily.   antiseptic oral rinse Liqd 15 mLs by Mouth Rinse route 2 (two) times daily as needed for dry mouth.   cholecalciferol 1000 units tablet Commonly known as: VITAMIN D Take 1,000 Units by mouth daily.   co-enzyme Q-10 30 MG capsule Take 30 mg by mouth daily.   diclofenac sodium 1 % Gel Commonly known as: VOLTAREN Apply 1 application topically 2 (two) times daily as needed (pain).   diltiazem 360  MG 24 hr capsule Commonly known as: CARDIZEM CD Take 1 capsule (360 mg total) by mouth daily.   docusate sodium 100 MG capsule Commonly known as: COLACE Take 100 mg by mouth daily.   dofetilide 250 MCG capsule Commonly known as: TIKOSYN Take 1 capsule (250 mcg total) by mouth 2 (two) times daily.   Eliquis 5 MG Tabs tablet Generic drug: apixaban TAKE 1 TABLET BY MOUTH  TWICE DAILY   ezetimibe 10 MG tablet Commonly known as: ZETIA Take 1 tablet (10 mg total) by mouth at bedtime.   furosemide 20 MG tablet Commonly known as: LASIX TAKE 1 TABLET BY MOUTH  DAILY AS NEEDED FOR EDEMA   gabapentin 300 MG capsule Commonly known as: NEURONTIN Take 2 capsules every morning, 1 capsule every afternoon and 2 capsules at bedtime   losartan 50 MG tablet Commonly known as: COZAAR Take 1 tablet (50 mg total) by mouth daily.   lovastatin 10 MG tablet Commonly known as: MEVACOR TAKE 1 TABLET BY MOUTH  EVERY MONDAY, WEDNESDAY,  AND FRIDAY   metoprolol tartrate 50 MG tablet Commonly known as: LOPRESSOR Take 1.5 tablets (75 mg total) by mouth 2 (two) times daily.   pantoprazole 40 MG tablet Commonly known as: PROTONIX Take 40 mg by mouth daily.   PARoxetine 30 MG tablet Commonly known as: PAXIL Take 1 tablet (30 mg total) by mouth at bedtime.   Polyethyl Glycol-Propyl Glycol 0.4-0.3 % Gel ophthalmic gel Commonly known as: SYSTANE Place 1 drop into both eyes 2 (two) times daily.   polyethylene glycol powder 17 GM/SCOOP powder Commonly known as: GLYCOLAX/MIRALAX Take 8.5-17 g by mouth daily as needed for moderate constipation. (1/2-1 capful daily as needed)   Restasis 0.05 % ophthalmic emulsion Generic drug: cycloSPORINE INSTILL ONE DROP IN BOTH  EYES TWO TIMES DAILY What changed: See the new instructions.   traMADol 50 MG tablet Commonly known as: ULTRAM Take 1 tablet (50 mg total) by mouth daily as needed. What changed: reasons to take this        Disposition:  Home Discharge Instructions     Diet - low sodium heart healthy   Complete by: As directed    Increase activity slowly   Complete by: As directed        Follow-up Information     [facility] Follow up.   Specialty: Cardiology Why: 11/06/20 @ 11:30AM with Marcene Brawn, PA-C Contact information: [address] [id number] [place]        Shirley Friar, PA-C Follow up.   Specialty: Physician Assistant Why: 11/27/20 @ 10:40AM (for Dr. Lovena Le) Contact information: [address] [place] [address] [phone]  Duration of Discharge Encounter: Greater than 30 minutes including physician time.  Venetia Night, PA-C 10/26/2020 10:52 AM  EP Attending  Patient seen and examined. Agree with the findings as noted above. The patient is doing well and is maintaining NSR. Her QT is around 450 corrected. She will be discharged home with the usual followup.   Carleene Overlie Emeri Estill,MD

## 2020-10-26 ENCOUNTER — Other Ambulatory Visit (HOSPITAL_COMMUNITY): Payer: Self-pay

## 2020-10-26 DIAGNOSIS — I484 Atypical atrial flutter: Secondary | ICD-10-CM | POA: Diagnosis not present

## 2020-10-26 LAB — MAGNESIUM: Magnesium: 2 mg/dL (ref 1.7–2.4)

## 2020-10-26 LAB — BASIC METABOLIC PANEL
Anion gap: 6 (ref 5–15)
BUN: 9 mg/dL (ref 8–23)
CO2: 23 mmol/L (ref 22–32)
Calcium: 9.3 mg/dL (ref 8.9–10.3)
Chloride: 106 mmol/L (ref 98–111)
Creatinine, Ser: 1.2 mg/dL — ABNORMAL HIGH (ref 0.44–1.00)
GFR, Estimated: 49 mL/min — ABNORMAL LOW (ref >60)
Glucose, Bld: 112 mg/dL — ABNORMAL HIGH (ref 70–99)
Potassium: 4.5 mmol/L (ref 3.5–5.1)
Sodium: 135 mmol/L (ref 135–145)

## 2020-10-26 MED ORDER — MAGNESIUM OXIDE -MG SUPPLEMENT 400 (240 MG) MG PO TABS
800.0000 mg | ORAL_TABLET | Freq: Once | ORAL | Status: AC
  Administered 2020-10-26: 800 mg via ORAL
  Filled 2020-10-26: qty 2

## 2020-10-26 MED ORDER — DOFETILIDE 250 MCG PO CAPS: 250.0000 ug | ORAL_CAPSULE | Freq: Two times a day (BID) | ORAL | 1 refills | Status: DC

## 2020-10-26 MED ORDER — DOFETILIDE 250 MCG PO CAPS
250.0000 ug | ORAL_CAPSULE | Freq: Two times a day (BID) | ORAL | 0 refills | Status: DC
  Filled 2020-10-26: qty 60, 30d supply, fill #0

## 2020-10-26 MED ORDER — MAGNESIUM SULFATE 2 GM/50ML IV SOLN
2.0000 g | Freq: Once | INTRAVENOUS | Status: DC
  Filled 2020-10-26: qty 50

## 2020-10-26 NOTE — Progress Notes (Signed)
Pharmacy: Dofetilide (Tikosyn) - Follow Up Assessment and Electrolyte Replacement  Pharmacy consulted to assist in monitoring and replacing electrolytes in this 70 y.o. female admitted on 10/23/2020 undergoing dofetilide initiation. First dofetilide dose: 10/23/20  Labs:    Component Value Date/Time   K 4.5 10/26/2020 0229   K 4.5 12/14/2014 0000   MG 2.0 10/26/2020 0229     Plan: Potassium: K >/= 4: No additional supplementation needed  Magnesium: Mg 1.8-2: Give Mg 2 gm IV x1    Patient has not required potassium supplements.   Thank you for allowing pharmacy to participate in this patient's care   Erin Hearing PharmD., BCPS Clinical Pharmacist 10/26/2020 7:35 AM

## 2020-10-30 ENCOUNTER — Other Ambulatory Visit: Payer: Self-pay | Admitting: Family Medicine

## 2020-10-30 ENCOUNTER — Other Ambulatory Visit: Payer: Self-pay

## 2020-10-30 ENCOUNTER — Ambulatory Visit
Admission: RE | Admit: 2020-10-30 | Discharge: 2020-10-30 | Disposition: A | Discharge status: Home / Self Care | Payer: Medicare Other | Source: Ambulatory Visit | Attending: Family Medicine | Admitting: Family Medicine

## 2020-10-30 DIAGNOSIS — Z1231 Encounter for screening mammogram for malignant neoplasm of breast: Secondary | ICD-10-CM

## 2020-10-31 ENCOUNTER — Other Ambulatory Visit: Payer: Self-pay | Admitting: Cardiology

## 2020-11-01 NOTE — Telephone Encounter (Signed)
Rx(s) sent to pharmacy electronically.  

## 2020-11-06 ENCOUNTER — Encounter (HOSPITAL_COMMUNITY): Payer: Self-pay | Admitting: Physician Assistant

## 2020-11-06 ENCOUNTER — Other Ambulatory Visit: Payer: Self-pay

## 2020-11-06 ENCOUNTER — Ambulatory Visit (HOSPITAL_COMMUNITY)
Admit: 2020-11-06 | Discharge: 2020-11-06 | Disposition: A | Discharge status: Home / Self Care | Payer: Medicare Other | Source: Ambulatory Visit | Attending: Physician Assistant | Admitting: Physician Assistant

## 2020-11-06 VITALS — BP 150/82 | HR 66 | Ht 61.0 in | Wt 201.4 lb

## 2020-11-06 DIAGNOSIS — Z79899 Other long term (current) drug therapy: Secondary | ICD-10-CM | POA: Diagnosis not present

## 2020-11-06 DIAGNOSIS — Z7901 Long term (current) use of anticoagulants: Secondary | ICD-10-CM | POA: Insufficient documentation

## 2020-11-06 DIAGNOSIS — Z560 Unemployment, unspecified: Secondary | ICD-10-CM | POA: Insufficient documentation

## 2020-11-06 DIAGNOSIS — N183 Chronic kidney disease, stage 3 unspecified: Secondary | ICD-10-CM | POA: Diagnosis not present

## 2020-11-06 DIAGNOSIS — I5032 Chronic diastolic (congestive) heart failure: Secondary | ICD-10-CM | POA: Diagnosis not present

## 2020-11-06 DIAGNOSIS — D6869 Other thrombophilia: Secondary | ICD-10-CM | POA: Diagnosis not present

## 2020-11-06 DIAGNOSIS — I484 Atypical atrial flutter: Secondary | ICD-10-CM

## 2020-11-06 DIAGNOSIS — I130 Hypertensive heart and chronic kidney disease with heart failure and stage 1 through stage 4 chronic kidney disease, or unspecified chronic kidney disease: Secondary | ICD-10-CM | POA: Diagnosis not present

## 2020-11-06 DIAGNOSIS — I4892 Unspecified atrial flutter: Secondary | ICD-10-CM | POA: Diagnosis not present

## 2020-11-06 DIAGNOSIS — Z8249 Family history of ischemic heart disease and other diseases of the circulatory system: Secondary | ICD-10-CM | POA: Insufficient documentation

## 2020-11-06 LAB — BASIC METABOLIC PANEL
Anion gap: 8 (ref 5–15)
BUN: 8 mg/dL (ref 8–23)
CO2: 22 mmol/L (ref 22–32)
Calcium: 9.9 mg/dL (ref 8.9–10.3)
Chloride: 109 mmol/L (ref 98–111)
Creatinine, Ser: 1.32 mg/dL — ABNORMAL HIGH (ref 0.44–1.00)
GFR, Estimated: 44 mL/min — ABNORMAL LOW (ref >60)
Glucose, Bld: 98 mg/dL (ref 70–99)
Potassium: 4.1 mmol/L (ref 3.5–5.1)
Sodium: 139 mmol/L (ref 135–145)

## 2020-11-06 LAB — MAGNESIUM: Magnesium: 2 mg/dL (ref 1.7–2.4)

## 2020-11-06 MED ORDER — DOFETILIDE 250 MCG PO CAPS: 250.0000 ug | ORAL_CAPSULE | Freq: Two times a day (BID) | ORAL | 1 refills | Status: DC

## 2020-11-06 NOTE — Progress Notes (Addendum)
Primary Care Physician: Ria Bush, MD Referring Physician: Dr. Stanford Breed Primary EP: Dr Greig [place] Leach is a 70 y.o. female with a h/o new onset typical atrial flutter in April 2022. H/o of rheumatic heart disease with remote repair pf mitral valve in 1980, CHF, CKD.  She was placed on anticoagulation and had a succession CV but ERAfl. She felt much improved while in SR.   On follow up today, patient is s/p dofetilide admission 8/8-8/11/22. She converted with medication and did not require DCCV. Patient reports that she feels "great" with much more energy and no palpitations. She denies any bleeding issues on anticoagulation.   Today, she denies symptoms of palpitations, chest pain, shortness of breath, orthopnea, PND, lower extremity edema, dizziness, presyncope, syncope, or neurologic sequela. The patient is tolerating medications without difficulties and is otherwise without complaint today.   Past Medical History:  Diagnosis Date   Ankle fracture, left 02/19/2015   from a fall   Anxiety    Cataract 2014   corrected with surgery   CHF (congestive heart failure) (HCC)    CKD (chronic kidney disease) stage 3, GFR 30-59 ml/min [facility])    saw nephrologist Dr Harden Mo   DDD (degenerative disc disease), cervical    Depression    Fibromyalgia    Glaucoma    s/p surgery, sees ophtho Q6 mo   History of DVT (deep vein thrombosis) several times latest 2012   receives coumadin while hospitalized   History of kidney stones 2010   History of pulmonary embolism 2001, 2006   completed coumadin courses   History of rheumatic fever x3   HLD (hyperlipidemia)    HTN (hypertension)    Insomnia    Lung nodule 09/22/2012   RLL - 51m, stable since 2014. Thought benign.    Osteoarthritis    shoulders and knees, not RA per Dr DEstanislado Pandy positive ANA, positive Ro   Osteopenia 09/18/2015   DEXA T -1.1 hip, -0.2 spine 08/2015    Personal history of urinary calculi latest 2014    Pneumonia 12/02/2011   PONV (postoperative nausea and vomiting)    Refusal of blood transfusions as patient is Jehovah's Witness    Rheumatic heart disease 1980   s/p mitral valve repair 1980   S[place]ogren's syndrome ([place]    Trimalleolar fracture of left ankle 02/23/2015   Past Surgical History:  Procedure Laterality Date   BREAST BIOPSY [place] 2006   benign   CARDIOVERSION N/A 07/18/2020   Procedure: CARDIOVERSION;  Surgeon: AElouise Munroe MD;  [facility]  Service: Cardiovascular;  Laterality: N/A;   CHOLECYSTECTOMY  11/27/2011   Procedure: LAPAROSCOPIC CHOLECYSTECTOMY WITH INTRAOPERATIVE CHOLANGIOGRAM;  Surgeon: HAdin Hector MD;  [place]  Service: General;  Laterality: N/A;  laparoscopic cholecystectomy with choleangiogram umbilical hernia repair   COLONOSCOPY  07/2014   WNL (Amedeo Plenty   COLONOSCOPY  08/2019   3 TAs, rpt 3 yrs ([place]t)   dexa  08/2012   normal per patient - no records available   ESOPHAGOGASTRODUODENOSCOPY  08/2019   reactive gastropathy, neg H pylori ([place]   EYE SURGERY Bilateral 2014   cataract removal   MITRAL VALVE REPAIR  1980   open heart   ORIF ANKLE FRACTURE Left 02/26/2015   Procedure: OPEN REDUCTION INTERNAL FIXATION (ORIF) LEFT TRIMALLEOLAR ANKLE FRACTURE;  Surgeon: NLeandrew Koyanagi MD;  [place]  Service: Orthopedics;  Laterality: Left;   TUBAL LIGATION  [id number]  UMBILICAL HERNIA REPAIR  11/27/2011   Procedure: HERNIA REPAIR UMBILICAL ADULT;  Surgeon: Adin Hector, MD;  Location: [place];  Service: General;  Laterality: N/A;   [place]   for fibroids -- partial, ovaries remain    Current Outpatient Medications  Medication Sig Dispense Refill   ALPRAZolam (XANAX) 0.5 MG tablet Take 1 tablet (0.5 mg total) by mouth 2 (two) times daily. 180 tablet 2   antiseptic oral rinse (BIOTENE) LIQD 15 mLs by Mouth Rinse route 2 (two) times daily as needed for dry mouth.     apixaban (ELIQUIS) 5 MG TABS tablet  TAKE 1 TABLET BY MOUTH  TWICE DAILY 180 tablet 1   cholecalciferol (VITAMIN D) 1000 UNITS tablet Take 1,000 Units by mouth daily.     co-enzyme Q-10 30 MG capsule Take 30 mg by mouth daily.     diclofenac sodium (VOLTAREN) 1 % GEL Apply 1 application topically 2 (two) times daily as needed (pain).     diltiazem (CARDIZEM CD) 360 MG 24 hr capsule TAKE 1 CAPSULE BY MOUTH  DAILY 90 capsule 3   docusate sodium (COLACE) 100 MG capsule Take 100 mg by mouth daily.     ezetimibe (ZETIA) 10 MG tablet Take 1 tablet (10 mg total) by mouth at bedtime. 90 tablet 3   furosemide (LASIX) 20 MG tablet TAKE 1 TABLET BY MOUTH  DAILY AS NEEDED FOR EDEMA 90 tablet 3   gabapentin (NEURONTIN) 300 MG capsule Take 2 capsules every morning, 1 capsule every afternoon and 2 capsules at bedtime 450 capsule 3   losartan (COZAAR) 50 MG tablet Take 1 tablet (50 mg total) by mouth daily. 90 tablet 3   lovastatin (MEVACOR) 10 MG tablet TAKE 1 TABLET BY MOUTH  EVERY MONDAY, WEDNESDAY,  AND FRIDAY 39 tablet 3   metoprolol tartrate (LOPRESSOR) 50 MG tablet Take 1.5 tablets (75 mg total) by mouth 2 (two) times daily. 270 tablet 3   pantoprazole (PROTONIX) 40 MG tablet Take 40 mg by mouth daily.     PARoxetine (PAXIL) 30 MG tablet Take 1 tablet (30 mg total) by mouth at bedtime. 30 tablet 2   Polyethyl Glycol-Propyl Glycol (SYSTANE) 0.4-0.3 % GEL ophthalmic gel Place 1 drop into both eyes 2 (two) times daily.      polyethylene glycol powder (GLYCOLAX/MIRALAX) 17 GM/SCOOP powder Take 8.5-17 g by mouth daily as needed for moderate constipation. (1/2-1 capful daily as needed) 3350 g 1   RESTASIS 0.05 % ophthalmic emulsion INSTILL ONE DROP IN BOTH  EYES TWO TIMES DAILY 180 each 0   traMADol (ULTRAM) 50 MG tablet Take 1 tablet (50 mg total) by mouth daily as needed. 30 tablet 0   dofetilide (TIKOSYN) 250 MCG capsule Take 1 capsule (250 mcg total) by mouth 2 (two) times daily. 180 capsule 1   No current facility-administered medications for  this encounter.    Allergies  Allergen Reactions   Cymbalta [Duloxetine Hcl] Other (See Comments)    tachycardia   Statins Nausea Only and Other (See Comments)    Muscle cramps also   Sulfa Antibiotics Nausea And Vomiting    Social History   Socioeconomic History   Marital status: Married    Spouse name: [name]   Number of children: 4   Years of education: 12   Highest education level: Not on file  Occupational History    Employer: UNEMPLOYED    Comment: Disability  Tobacco Use   Smoking status: Never   Smokeless tobacco: Never  Vaping Use   Vaping Use: Never used  Substance and Sexual Activity   Alcohol use: No    Alcohol/week: 0.0 standard drinks   Drug use: No   Sexual activity: Not Currently    Birth control/protection: Surgical  Other Topics Concern   Not on file  Social History Narrative   Lives with son, 1 dog   Occupation: unemployed, on disability for fibromyalgia since 2008.   Edu: HS   Religion: Jehova's witness   Activity: volunteers at senior center   Diet: some water, fruits/vegetables daily   No caffeine use   Social Determinants of Health   Financial Resource Strain: Not on file  Food Insecurity: Not on file  Transportation Needs: Not on file  Physical Activity: Not on file  Stress: Not on file  Social Connections: Not on file  Intimate Partner Violence: Not on file    Family History  Problem Relation Age of Onset   Cancer Mother        lung (nonsmoker)   CAD Mother        MI in her 40s   ALS Mother    Kidney disease Father    Alcohol abuse Father    Diabetes Father    Lupus Sister        and niece   Diabetes Sister    Stroke Sister    Depression Sister    Lupus Sister    Blindness Sister    Cancer Maternal Uncle        bone   Stroke Maternal Grandmother    Cancer Brother        bone   Diabetes Brother    Heart attack Brother    Healthy Son    Healthy Son    Healthy Son    Healthy Son    Kidney failure Other         on HD   Breast cancer Neg Hx     ROS- All systems are reviewed and negative except as per the HPI above  Physical Exam: Vitals:   11/06/20 1121  BP: (!) 150/82  Pulse: 66  Weight: 91.4 kg  Height: '5\' 1"'$  (1.549 m)    Wt Readings from Last 3 Encounters:  11/06/20 91.4 kg  10/23/20 93.6 kg  10/23/20 93.7 kg    Labs: Lab Results  Component Value Date   NA 135 10/26/2020   K 4.5 10/26/2020   CL 106 10/26/2020   CO2 23 10/26/2020   GLUCOSE 112 (H) 10/26/2020   BUN 9 10/26/2020   CREATININE 1.20 (H) 10/26/2020   CALCIUM 9.3 10/26/2020   PHOS 3.2 07/11/2020   MG 2.0 10/26/2020   Lab Results  Component Value Date   INR 0.93 10/12/2015   Lab Results  Component Value Date   CHOL 167 05/29/2020   HDL 71 05/29/2020   [place] 82 05/29/2020   TRIG 75 05/29/2020    GEN- The patient is a well appearing obese female, alert and oriented x 3 today.   HEENT-head normocephalic, atraumatic, sclera clear, conjunctiva pink, hearing intact, trachea midline. Lungs- Clear to ausculation bilaterally, normal work of breathing Heart- Regular rate and rhythm, no murmurs, rubs or gallops  GI- soft, NT, ND, + BS Extremities- no clubbing, cyanosis, or edema MS- no significant deformity or atrophy Skin- no rash or lesion Psych- euthymic mood, full affect Neuro- strength and sensation are intact   EKG- SR Vent. rate 66 BPM PR interval 186 ms QRS duration 80 ms QT/QTcB  426/446 ms  CHA2DS2-VASc Score = 4  The patient's score is based upon: CHF History: Yes HTN History: Yes Diabetes History: No Stroke History: No Vascular Disease History: No Age Score: 1 Gender Score: 1     ASSESSMENT AND PLAN: 1. Atypical atrial flutter The patient's CHA2DS2-VASc score is 4, indicating a 4.8% annual risk of stroke.   S/p dofetilide admission 8/8-8/11/22 Patient appears to be maintaining SR. Continue dofetilide 250 mcg BID. QT stable. Check bmet/mag today. Continue Eliquis 5 mg  BID Continue diltiazem 360 mg daily Continue Lopressor 75 mg BID  2. Secondary Hypercoagulable State (ICD10:  D68.69) The patient is at significant risk for stroke/thromboembolism based upon her CHA2DS2-VASc Score of 4.  Continue Apixaban (Eliquis).   3. HFpEF  No signs or symptoms of fluid overload today.  4. HTN Stable, no changes today.   Follow up with Oda Kilts as scheduled. AF clinic in 4 months.    [facility] [address] [place], [place] [address] [phone]

## 2020-11-08 ENCOUNTER — Ambulatory Visit (HOSPITAL_COMMUNITY): Payer: Medicare Other | Attending: Cardiovascular Disease

## 2020-11-08 ENCOUNTER — Other Ambulatory Visit: Payer: Self-pay

## 2020-11-08 DIAGNOSIS — I350 Nonrheumatic aortic (valve) stenosis: Secondary | ICD-10-CM | POA: Diagnosis not present

## 2020-11-08 DIAGNOSIS — Z9889 Other specified postprocedural states: Secondary | ICD-10-CM | POA: Insufficient documentation

## 2020-11-08 DIAGNOSIS — N189 Chronic kidney disease, unspecified: Secondary | ICD-10-CM | POA: Diagnosis not present

## 2020-11-08 DIAGNOSIS — I342 Nonrheumatic mitral (valve) stenosis: Secondary | ICD-10-CM

## 2020-11-08 DIAGNOSIS — I509 Heart failure, unspecified: Secondary | ICD-10-CM | POA: Diagnosis not present

## 2020-11-08 DIAGNOSIS — I130 Hypertensive heart and chronic kidney disease with heart failure and stage 1 through stage 4 chronic kidney disease, or unspecified chronic kidney disease: Secondary | ICD-10-CM | POA: Diagnosis not present

## 2020-11-08 DIAGNOSIS — E785 Hyperlipidemia, unspecified: Secondary | ICD-10-CM | POA: Insufficient documentation

## 2020-11-08 LAB — ECHOCARDIOGRAM COMPLETE
AR max vel: 0.92 cm2
AV Area VTI: 1.08 cm2
AV Area mean vel: 1 cm2
AV Mean grad: 12 mmHg
AV Peak grad: 22.5 mmHg
Ao pk vel: 2.37 m/s
Area-P 1/2: 2.41 cm2
MV M vel: 4.25 m/s
MV Peak grad: 72.3 mmHg
MV VTI: 0.93 cm2
P 1/2 time: 424 msec
S' Lateral: 2.6 cm

## 2020-11-24 NOTE — Progress Notes (Signed)
PCP:  Ria Bush, MD Primary Cardiologist: Kirk Ruths, MD Electrophysiologist: Cristopher Peru, MD   [place] Leach is a 70 y.o. female seen today for Cristopher Peru, MD for routine electrophysiology followup.  Since last being seen in our clinic the patient reports doing very well. She has not felt any breakthrough AF.  she denies chest pain, palpitations, dyspnea, PND, orthopnea, nausea, vomiting, dizziness, syncope, edema, weight gain, or early satiety.  Past Medical History:  Diagnosis Date   Ankle fracture, left 02/19/2015   from a fall   Anxiety    Cataract 2014   corrected with surgery   CHF (congestive heart failure) (HCC)    CKD (chronic kidney disease) stage 3, GFR 30-59 ml/min [facility])    saw nephrologist Dr Harden Mo   DDD (degenerative disc disease), cervical    Depression    Fibromyalgia    Glaucoma    s/p surgery, sees ophtho Q6 mo   History of DVT (deep vein thrombosis) several times latest 2012   receives coumadin while hospitalized   History of kidney stones 2010   History of pulmonary embolism 2001, 2006   completed coumadin courses   History of rheumatic fever x3   HLD (hyperlipidemia)    HTN (hypertension)    Insomnia    Lung nodule 09/22/2012   RLL - 38m, stable since 2014. Thought benign.    Osteoarthritis    shoulders and knees, not RA per Dr DEstanislado Pandy positive ANA, positive Ro   Osteopenia 09/18/2015   DEXA T -1.1 hip, -0.2 spine 08/2015    Personal history of urinary calculi latest 2014   Pneumonia 12/02/2011   PONV (postoperative nausea and vomiting)    Refusal of blood transfusions as patient is Jehovah's Witness    Rheumatic heart disease 1980   s/p mitral valve repair 1980   [place]ogren's syndrome ([place]    Trimalleolar fracture of left ankle 02/23/2015   Past Surgical History:  Procedure Laterality Date   BREAST BIOPSY Right 2006   benign   CARDIOVERSION N/A 07/18/2020   Procedure: CARDIOVERSION;  Surgeon: AElouise Munroe MD;   [facility];  Service: Cardiovascular;  Laterality: N/A;   CHOLECYSTECTOMY  11/27/2011   Procedure: LAPAROSCOPIC CHOLECYSTECTOMY WITH INTRAOPERATIVE CHOLANGIOGRAM;  Surgeon: HAdin Hector MD;  [place]  Service: General;  Laterality: N/A;  laparoscopic cholecystectomy with choleangiogram umbilical hernia repair   COLONOSCOPY  07/2014   WNL (Amedeo Plenty   COLONOSCOPY  08/2019   3 TAs, rpt 3 yrs ([place])   dexa  08/2012   normal per patient - no records available   ESOPHAGOGASTRODUODENOSCOPY  08/2019   reactive gastropathy, neg H pylori ([place]   EYE SURGERY Bilateral 2014   cataract removal   MITRAL VALVE REPAIR  1980   open heart   ORIF ANKLE FRACTURE Left 02/26/2015   Procedure: OPEN REDUCTION INTERNAL FIXATION (ORIF) LEFT TRIMALLEOLAR ANKLE FRACTURE;  Surgeon: NLeandrew Koyanagi MD;  [place]  Service: Orthopedics;  Laterality: Left;   TUBAL LIGATION  [id number]  UMBILICAL HERNIA REPAIR  11/27/2011   Procedure: HERNIA REPAIR UMBILICAL ADULT;  Surgeon: HAdin Hector MD;  [place]  Service: General;  Laterality: N/A;   [place]  for fibro[place] -- partial, ovaries remain    Current Outpatient Medications  Medication Sig Dispense Refill   ALPRAZolam (XANAX) 0.5 MG tablet Take 1 tablet (0.5 mg total) by mouth 2 (two) times daily. 180 tablet 2   antiseptic  oral rinse (BIOTENE) LIQD 15 mLs by Mouth Rinse route 2 (two) times daily as needed for dry mouth.     apixaban (ELIQUIS) 5 MG TABS tablet TAKE 1 TABLET BY MOUTH  TWICE DAILY 180 tablet 1   cholecalciferol (VITAMIN D) 1000 UNITS tablet Take 1,000 Units by mouth daily.     co-enzyme Q-10 30 MG capsule Take 30 mg by mouth daily.     diclofenac sodium (VOLTAREN) 1 % GEL Apply 1 application topically 2 (two) times daily as needed (pain).     diltiazem (CARDIZEM CD) 360 MG 24 hr capsule TAKE 1 CAPSULE BY MOUTH  DAILY 90 capsule 3   docusate sodium (COLACE) 100 MG capsule Take 100 mg by mouth  daily.     dofetilide (TIKOSYN) 250 MCG capsule Take 1 capsule (250 mcg total) by mouth 2 (two) times daily. 180 capsule 1   ezetimibe (ZETIA) 10 MG tablet Take 1 tablet (10 mg total) by mouth at bedtime. 90 tablet 3   furosemide (LASIX) 20 MG tablet TAKE 1 TABLET BY MOUTH  DAILY AS NEEDED FOR EDEMA 90 tablet 3   gabapentin (NEURONTIN) 300 MG capsule Take 2 capsules every morning, 1 capsule every afternoon and 2 capsules at bedtime 450 capsule 3   lovastatin (MEVACOR) 10 MG tablet TAKE 1 TABLET BY MOUTH  EVERY MONDAY, WEDNESDAY,  AND FRIDAY 39 tablet 3   metoprolol tartrate (LOPRESSOR) 50 MG tablet Take 1.5 tablets (75 mg total) by mouth 2 (two) times daily. 270 tablet 3   pantoprazole (PROTONIX) 40 MG tablet Take 40 mg by mouth daily.     PARoxetine (PAXIL) 30 MG tablet Take 1 tablet (30 mg total) by mouth at bedtime. 30 tablet 2   Polyethyl Glycol-Propyl Glycol (SYSTANE) 0.4-0.3 % GEL ophthalmic gel Place 1 drop into both eyes 2 (two) times daily.      polyethylene glycol powder (GLYCOLAX/MIRALAX) 17 GM/SCOOP powder Take 8.5-17 g by mouth daily as needed for moderate constipation. (1/2-1 capful daily as needed) 3350 g 1   RESTASIS 0.05 % ophthalmic emulsion INSTILL ONE DROP IN BOTH  EYES TWO TIMES DAILY 180 each 0   traMADol (ULTRAM) 50 MG tablet Take 1 tablet (50 mg total) by mouth daily as needed. 30 tablet 0   losartan (COZAAR) 50 MG tablet Take 1 tablet (50 mg total) by mouth daily. 90 tablet 3   No current facility-administered medications for this visit.    Allergies  Allergen Reactions   Cymbalta [Duloxetine Hcl] Other (See Comments)    tachycardia   Statins Nausea Only and Other (See Comments)    Muscle cramps also   Sulfa Antibiotics Nausea And Vomiting    Social History   Socioeconomic History   Marital status: Married    Spouse name: [name]   Number of children: 4   Years of education: 12   Highest education level: Not on file  Occupational History    Employer:  UNEMPLOYED    Comment: Disability  Tobacco Use   Smoking status: Never   Smokeless tobacco: Never  Vaping Use   Vaping Use: Never used  Substance and Sexual Activity   Alcohol use: No    Alcohol/week: 0.0 standard drinks   Drug use: No   Sexual activity: Not Currently    Birth control/protection: Surgical  Other Topics Concern   Not on file  Social History Narrative   Lives with son, 1 dog   Occupation: unemployed, on disability for fibromyalgia since 2008.  Edu: HS   Religion: Jehova's witness   Activity: volunteers at senior center   Diet: some water, fruits/vegetables daily   No caffeine use   Social Determinants of Health   Financial Resource Strain: Not on file  Food Insecurity: Not on file  Transportation Needs: Not on file  Physical Activity: Not on file  Stress: Not on file  Social Connections: Not on file  Intimate Partner Violence: Not on file     Review of Systems: All other systems reviewed and are otherwise negative except as noted above.  Physical Exam: Vitals:   11/27/20 1035  BP: 104/84  Pulse: 75  SpO2: 98%  Weight: 201 lb 9.6 oz (91.4 kg)  Height: '5\' 1"'$  (1.549 m)    GEN- The patient is well appearing, alert and oriented x 3 today.   HEENT: normocephalic, atraumatic; sclera clear, conjunctiva pink; hearing intact; oropharynx clear; neck supple, no JVP Lymph- no cervical lymphadenopathy Lungs- Clear to ausculation bilaterally, normal work of breathing.  No wheezes, rales, rhonchi Heart- Regular rate and rhythm, no murmurs, rubs or gallops, PMI not laterally displaced GI- soft, non-tender, non-distended, bowel sounds present, no hepatosplenomegaly Extremities- no clubbing, cyanosis, or edema; DP/PT/radial pulses 2+ bilaterally MS- no significant deformity or atrophy Skin- warm and dry, no rash or lesion Psych- euthymic mood, full affect Neuro- strength and sensation are intact  EKG is ordered. Personal review of EKG from today shows NSR at  75 bpm, QTc ~ 470 ms  Additional studies reviewed include: Previous EP and AF office notes.   Assessment and Plan:  1. Atypical atrial flutter The patient's CHA2DS2-VASc score is 4, indicating a 4.8% annual risk of stroke.   S/p dofetilide admission 8/8-8/11/22 EKG today shows NSR with stable QT Continue dofetilide 250 mcg BID.  Check bmet/mag today. Continue Eliquis 5 mg BID Continue diltiazem 360 mg daily Continue Lopressor 75 mg BID   2. Secondary Hypercoagulable State (ICD10:  D68.69) The patient is at significant risk for stroke/thromboembolism based upon her CHA2DS2-VASc Score of 4.  Continue Apixaban (Eliquis). Denies bleeding   3. HFpEF  Volume status stable on exam today.    4. HTN Stable on current regimen   Labs today. RTC 6 months for on-going Tikosyn follow up  Shirley Friar, PA-C  11/27/20 11:10 AM

## 2020-11-27 ENCOUNTER — Ambulatory Visit (INDEPENDENT_AMBULATORY_CARE_PROVIDER_SITE_OTHER): Payer: Medicare Other | Admitting: Student

## 2020-11-27 ENCOUNTER — Other Ambulatory Visit: Payer: Self-pay

## 2020-11-27 ENCOUNTER — Encounter: Payer: Self-pay | Admitting: Student

## 2020-11-27 VITALS — BP 104/84 | HR 75 | Ht 61.0 in | Wt 201.6 lb

## 2020-11-27 DIAGNOSIS — I484 Atypical atrial flutter: Secondary | ICD-10-CM

## 2020-11-27 DIAGNOSIS — D6869 Other thrombophilia: Secondary | ICD-10-CM

## 2020-11-27 DIAGNOSIS — I10 Essential (primary) hypertension: Secondary | ICD-10-CM

## 2020-11-27 DIAGNOSIS — R002 Palpitations: Secondary | ICD-10-CM

## 2020-11-27 LAB — BASIC METABOLIC PANEL
BUN/Creatinine Ratio: 9 — ABNORMAL LOW (ref 12–28)
BUN: 12 mg/dL (ref 8–27)
CO2: 23 mmol/L (ref 20–29)
Calcium: 9.6 mg/dL (ref 8.7–10.3)
Chloride: 105 mmol/L (ref 96–106)
Creatinine, Ser: 1.35 mg/dL — ABNORMAL HIGH (ref 0.57–1.00)
Glucose: 76 mg/dL (ref 65–99)
Potassium: 4.5 mmol/L (ref 3.5–5.2)
Sodium: 140 mmol/L (ref 134–144)
eGFR: 43 mL/min/{1.73_m2} — ABNORMAL LOW (ref >59)

## 2020-11-27 LAB — MAGNESIUM: Magnesium: 2 mg/dL (ref 1.6–2.3)

## 2020-11-27 NOTE — Patient Instructions (Signed)
Medication Instructions:  Your physician recommends that you continue on your current medications as directed. Please refer to the Current Medication list given to you today.  *If you need a refill on your cardiac medications before your next appointment, please call your pharmacy*   Lab Work: TODAY: BMET, Mg  If you have labs (blood work) drawn today and your tests are completely normal, you will receive your results only by: [place] (if you have MyChart) OR A paper copy in the mail If you have any lab test that is abnormal or we need to change your treatment, we will call you to review the results.   Follow-Up: At [facility], you and your health needs are our priority.  As part of our continuing mission to provide you with exceptional heart care, we have created designated Provider Care Teams.  These Care Teams include your primary Cardiologist (physician) and Advanced Practice Providers (APPs -  Physician Assistants and Nurse Practitioners) who all work together to provide you with the care you need, when you need it.  We recommend signing up for the patient portal called "MyChart".  Sign up information is provided on this After Visit Summary.  MyChart is used to connect with patients for Virtual Visits (Telemedicine).  Patients are able to view lab/test results, encounter notes, upcoming appointments, etc.  Non-urgent messages can be sent to your provider as well.   To learn more about what you can do with MyChart, go to [email or web address].    Your next appointment:   6 month(s)  The format for your next appointment:   In Person  Provider:   You may see Cristopher Peru, MD or one of the following Advanced Practice Providers on your designated Care Team:   Tommye Standard, [place] "[facility]" [place], [place]

## 2020-11-27 NOTE — Addendum Note (Signed)
Addended by: Carylon Perches on: 11/27/2020 11:17 AM   Modules accepted: Orders

## 2020-11-27 NOTE — Progress Notes (Signed)
Office Visit Note  Patient: Alexandria Leach             Date of Birth: [birth year]           MRN: [id number]             PCP: Ria Bush, MD Referring: Ria Bush, MD Visit Date: 12/11/2020 Occupation: '@GUAROCC'$ @  Subjective:  Sicca symptoms    History of Present Illness: Alexandria Leach is a 70 y.o. female with history of sjogren's syndrome, DDD, and fibromyalgia.  Patient reports that she continues to have chronic sicca symptoms.  She has been using Systane and Restasis twice daily for eye dryness and Biotene oral rinse and toothpaste for mouth dryness.  She continues to see her ophthalmologist on a yearly basis and is overdue for her dental exam.  She denies any parotid swelling or tenderness.  She has not had any swollen lymph nodes.  She denies any shortness of breath.  She was admitted to the hospital on 10/23/20 for management of atypical atrial flutter.  She was started on Tikosyn which converted the patient so she did not require DCCV.  She has been following up outpatient at the afib clinic.  Her energy level continues to improve.  She states she has occasional myalgias and muscle tenderness.  She experiences pain and stiffness in both hips due to trochanteric bursitis. Her discomfort usually improves within 10 minutes.  She also has stiffness in both hands first thing in the morning but denies any joint swelling.  She takes tramadol very sparingly for pain relief.  She remains on gabapentin as prescribed for management of neuropathy.   Activities of Daily Living:  Patient reports morning stiffness for 10-15 minutes.   Patient Reports nocturnal pain.  Difficulty dressing/grooming: Denies Difficulty climbing stairs: Denies Difficulty getting out of chair: Denies Difficulty using hands for taps, buttons, cutlery, and/or writing: Reports  Review of Systems  Constitutional:  Positive for fatigue.  HENT:  Positive for mouth dryness and nose dryness. Negative for  mouth sores.   Eyes:  Positive for pain, itching and dryness.  Respiratory:  Negative for shortness of breath and difficulty breathing.   Cardiovascular:  Negative for chest pain and palpitations.  Gastrointestinal:  Negative for blood in stool, constipation and diarrhea.  Endocrine: Negative for increased urination.  Genitourinary:  Negative for difficulty urinating.  Musculoskeletal:  Positive for joint pain, joint pain, myalgias, morning stiffness, muscle tenderness and myalgias. Negative for joint swelling.  Skin:  Negative for color change, rash and redness.  Allergic/Immunologic: Negative for susceptible to infections.  Neurological:  Negative for dizziness, numbness, headaches and memory loss.  Hematological:  Positive for bruising/bleeding tendency.  Psychiatric/Behavioral:  Negative for confusion.    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/22/2017   Thyroid nodule 09/16/2017   Neck pain on right side 05/14/2017   Sialoadenitis of submandibular gland 10/29/2016   High risk medication use 06/05/2016   Peripheral neuropathy 01/30/2016   DNR no code (do not resuscitate) 12/08/2015   Asymmetrical  right sensorineural hearing loss 12/08/2015   TIA (transient ischemic attack) 10/12/2015   Mild mitral regurgitation 10/12/2015   Osteopenia 09/18/2015   Right arm pain 08/29/2015   History of fall    Right sided sciatica 02/21/2015   Imbalance 01/12/2015   Transaminitis 12/24/2014   DDD  (degenerative disc disease), cervical    Health maintenance examination 10/18/2014   Advanced care planning/counseling discussion 10/18/2014   Medicare annual wellness visit, subsequent 10/18/2014   Refusal of blood transfusions as patient is Jehovah's Witness    Sjogren's syndrome ([place])    CKD (chronic kidney disease) stage 3, GFR 30-59 ml/min (HCC)    Glaucoma    Fibromyalgia    Osteoarthritis    History of pulmonary embolism    Lung nodule 09/22/2012   (HFpEF) heart failure with preserved ejection fraction ([place]) 12/04/2011   Aortic stenosis 04/22/2011   Palpitations 08/10/2010   HOT FLASHES 06/28/2008   ARTHRALGIA 07/01/2007   BACK PAIN, LEFT 08/25/2006   GAD (generalized anxiety disorder) 03/28/2006   History of mitral valve repair 03/28/2006   History of total abdominal hysterectomy 03/28/2006   HYPERCHOLESTEROLEMIA 12/31/2005   MDD (major depressive disorder), recurrent episode ([place]) 12/31/2005   RHEUMATIC HEART DISEASE 12/31/2005   Essential hypertension 12/31/2005   Chronic insomnia 12/31/2005    Past Medical History:  Diagnosis Date   Ankle fracture, left 02/19/2015   from a fall   Anxiety    Cataract 2014   corrected with surgery   CHF (congestive heart failure) (HCC)    CKD (chronic kidney disease) stage 3, GFR 30-59 ml/min (HCC)    saw nephrologist Dr Harden Mo   DDD (degenerative disc disease), cervical    Depression    Fibromyalgia    Glaucoma    s/p surgery, sees ophtho Q6 mo   History of DVT (deep vein thrombosis) several times latest 2012   receives coumadin while hospitalized   History of kidney stones 2010   History of pulmonary embolism 2001, 2006   completed coumadin courses   History of rheumatic fever x3   HLD (hyperlipidemia)    HTN (hypertension)    Insomnia    Lung nodule 09/22/2012   RLL - 25m, stable since 2014. Thought benign.    Osteoarthritis    shoulders and knees, not RA per Dr DEstanislado Pandy positive ANA, positive Ro   Osteopenia  09/18/2015   DEXA T -1.1 hip, -0.2 spine 08/2015    Personal history of urinary calculi latest 2014   Pneumonia 12/02/2011   PONV (postoperative nausea and vomiting)    Refusal of blood transfusions as patient is Jehovah's Witness    Rheumatic heart disease 1980   s/p mitral valve repair 1980   Sjogren's syndrome ([place]    Trimalleolar fracture of left ankle 02/23/2015    Family History  Problem Relation Age of Onset   Cancer Mother        lung (nonsmoker)   CAD Mother        MI in her 656s  ALS Mother    Kidney disease Father    Alcohol abuse Father    Diabetes Father    Lupus Sister        and niece   Diabetes Sister    Stroke Sister    Depression Sister    Lupus Sister    Blindness Sister    Cancer Maternal Uncle        bone   Stroke Maternal Grandmother    Cancer Brother  bone   Diabetes Brother    Heart attack Brother    Healthy Son    Healthy Son    Healthy Son    Healthy Son    Kidney failure Other        on HD   Breast cancer Neg Hx    Past Surgical History:  Procedure Laterality Date   BREAST BIOPSY Right 2006   benign   CARDIOVERSION N/A 07/18/2020   Procedure: CARDIOVERSION;  Surgeon: Elouise Munroe, MD;  Location: [facility] ENDOSCOPY;  Service: Cardiovascular;  Laterality: N/A;   CHOLECYSTECTOMY  11/27/2011   Procedure: LAPAROSCOPIC CHOLECYSTECTOMY WITH INTRAOPERATIVE CHOLANGIOGRAM;  Surgeon: Adin Hector, MD;  Location: [place] [facility];  Service: General;  Laterality: N/A;  laparoscopic cholecystectomy with choleangiogram umbilical hernia repair   COLONOSCOPY  07/2014   WNL Amedeo [facility]enty)   COLONOSCOPY  08/2019   3 TAs, rpt 3 yrs ([place])   dexa  08/2012   normal per patient - no records available   ESOPHAGOGASTRODUODENOSCOPY  08/2019   reactive gastropathy, neg H pylori ([place])   EYE SURGERY Bilateral 2014   cataract removal   MITRAL VALVE REPAIR  1980   open heart   ORIF ANKLE FRACTURE Left 02/26/2015   Procedure: OPEN REDUCTION INTERNAL FIXATION  (ORIF) LEFT TRIMALLEOLAR ANKLE FRACTURE;  Surgeon: [name], MD;  Location: [facility];  Service: Orthopedics;  Laterality: Left;   TUBAL LIGATION  [id number]   UMBILICAL HERNIA REPAIR  11/27/2011   Procedure: HERNIA REPAIR UMBILICAL ADULT;  Surgeon: Adin Hector, MD;  Location: [facility];  Service: General;  Laterality: N/A;   [place]   for fibroids -- partial, ovaries remain   Social History   Social History Narrative   Lives with son, 1 dog   Occupation: unemployed, on disability for fibromyalgia since 2008.   Edu: HS   Religion: Jehova's witness   Activity: volunteers at senior center   Diet: some water, fruits/vegetables daily   No caffeine use   Immunization History  Administered Date(s) Administered   PFIZER(Purple Top)SARS-COV-2 Vaccination 05/12/2019, 06/09/2019, 01/06/2020   Pneumococcal Conjugate-13 12/15/2017   Pneumococcal Polysaccharide-23 09/15/2012, 12/22/2018     Objective: Vital Signs: BP (!) 150/76 (BP Location: Left Arm, Patient Position: Sitting, Cuff Size: Normal)   Pulse 75   Ht '5\' 1"'$  (1.549 m)   Wt 201 lb 9.6 oz (91.4 kg)   BMI 38.09 kg/m    Physical Exam Vitals and nursing note reviewed.  Constitutional:      Appearance: She is well-developed.  HENT:     Head: Normocephalic and atraumatic.     Mouth/Throat:     Comments: No parotid swelling or tenderness  Eyes:     Conjunctiva/sclera: Conjunctivae normal.  Cardiovascular:     Rate and Rhythm: Normal rate and regular rhythm.     Heart sounds: Normal heart sounds.  Pulmonary:     Effort: Pulmonary effort is normal.     Breath sounds: Normal breath sounds.  Abdominal:     General: Bowel sounds are normal.     Palpations: Abdomen is soft.  Musculoskeletal:     Cervical back: Normal range of motion.  Lymphadenopathy:     Cervical: No cervical adenopathy.  Skin:    General: Skin is warm and dry.     Capillary Refill: Capillary refill takes less than 2 seconds.  Neurological:      Mental Status: She is alert and oriented to person, place, and time.  Psychiatric:  Behavior: Behavior normal.     Musculoskeletal Exam: C-spine has good ROM with no discomfort.  Shoulder joints, elbow joints, wrist joints, MCPs, PIPs, and DIPs good ROM with no synovitis.  Complete fist formation bilaterally.  DIP thickening consistent with OA of both hands.  Hip joints have limited ROM bilaterally.  Knee joints and ankle joints have good ROM with no discomfort.  Left knee crepitus noted.  No warmth or effusion of knee joints.  No tenderness or swelling of ankle joints.  Tenderness over bilateral trochanteric bursa.  CDAI Exam: CDAI Score: -- Patient Global: --; Provider Global: -- Swollen: --; Tender: -- Joint Exam 12/11/2020   No joint exam has been documented for this visit   There is currently no information documented on the homunculus. Go to the Rheumatology activity and complete the homunculus joint exam.  Investigation: No additional findings.  Imaging: No results found.  Recent Labs: Lab Results  Component Value Date   WBC 4.5 12/23/2019   HGB 12.6 07/18/2020   PLT 287.0 12/23/2019   NA 140 11/27/2020   K 4.5 11/27/2020   CL 105 11/27/2020   CO2 23 11/27/2020   GLUCOSE 76 11/27/2020   BUN 12 11/27/2020   CREATININE 1.35 (H) 11/27/2020   BILITOT 0.5 05/29/2020   ALKPHOS 78 05/29/2020   AST 23 05/29/2020   ALT 17 05/29/2020   PROT 7.2 05/29/2020   ALBUMIN 4.0 07/11/2020   CALCIUM 9.6 11/27/2020   GFRAA 51 (L) 11/11/2019    Speciality Comments: PLQ Eye Exam: 08/03/2018 WNL @ [facility] Follow up in 1 year  Procedures:  No procedures performed Allergies: Cymbalta [duloxetine hcl], Statins, and Sulfa antibiotics   Assessment / Plan:     Visit Diagnoses: Sjogren's syndrome with keratoconjunctivitis sicca ([place]) - +ANA, +Ro: She continues to have chronic sicca symptoms on a daily basis.  She has been using Systane eyedrops twice daily, Restasis twice  daily, and Biotene oral rinse and toothpaste for symptomatic relief.  She has not found Systane to be very effective at managing her mouth dryness so we discussed the use of XyliMelts which is available over-the-counter.  She continues to see her ophthalmologist on a yearly basis and tries to schedule dental appointments every 6 months.  She is overdue for her dental examination.  We discussed the importance of proper oral health due to the increased risk for dental caries with her history of Sjogren's syndrome.  She has not had any parotid swelling or tenderness.  No cervical lymphadenopathy.  She has not had any shortness of breath.  Her lungs were clear to auscultation on examination today.  No signs of inflammatory arthritis were noted.  She had no joint tenderness or synovitis on examination today.  She does not require immunosuppression at this time.  We will check the following lab work today.  She was advised to notify us if she develops any new or worsening symptoms.  She will follow-up in the office in 5 months.- Plan: CBC with Differential/Platelet, Urinalysis, Routine w reflex microscopic, Sedimentation rate, ANA, Sjogrens syndrome-A extractable nuclear antibody, Sjogrens syndrome-B extractable nuclear antibody, Serum protein electrophoresis with reflex  High risk medication use - Discontinued PLQ in Feb 2021.  She is not taking any immunosuppressive agents at this time.  - Plan: CBC with Differential/Platelet  Primary osteoarthritis of right knee: X-rays of the right knee were consistent with moderate osteoarthritis, moderate chondromalacia patella, and chondrocalcinosis on 02/14/20. She had a right knee joint cortisone injection performed  on 02/14/2020 which provided significant pain relief.  She has some discomfort when climbing steps but day-to-day has not had any increased pain or inflammation.  On examination she has good range of motion of the right knee joint with no warmth or  effusion.  DDD (degenerative disc disease), cervical: She has good ROM with no discomfort at this time.  No cervical radiculopathy.  She has an trapezius muscle tension and tenderness bilaterally.  Osteopenia of multiple sites - DEXA 09/14/2015 BMD left FN is 0.891 with T score -1.1.  She has been taking vitamin D supplement daily.  She will have repeat DEXA with her PCP.  Fibromyalgia: She experiences intermittent myalgias and muscle tenderness due to fibromyalgia.  She has trapezius muscle tension and tenderness bilaterally.  She has tenderness over bilateral trochanteric bursa.  We discussed the importance of regular exercise and good sleep hygiene.  She takes tramadol very sparingly for pain relief and does not require refill at this time.  She remains on gabapentin as prescribed for management of neuropathy.  Other chronic pain - Tramadol as needed for pain relief. UDS: 09/21/2020.  Narcotic agreement was updated today with the patient was in the office.  Other fatigue: Improving  Other insomnia: She has been sleeping well at night.  Other medical conditions are listed as follows:   Typical atrial flutter [facility]): Recent hospital admission on 10/23/2020 at which time the patient initiated Tikosyn.  She has been having frequent lab monitoring.  She has been tolerating Tikosyn without any side effects.  Her energy level has gradually been improving.  She will continue to follow-up with the atrial fibrillation clinic.  History of hypercholesterolemia  History of hypertension  Stage 3a chronic kidney disease (HCC)  Thyroid nodule - Thyroid ultrasound obtained on 10/27/19.   History of pulmonary embolism  History of anxiety  Orders: Orders Placed This Encounter  Procedures   CBC with Differential/Platelet   Urinalysis, Routine w reflex microscopic   Sedimentation rate   ANA   Sjogrens syndrome-A extractable nuclear antibody   Sjogrens syndrome-B extractable nuclear antibody   Serum  protein electrophoresis with reflex   No orders of the defined types were placed in this encounter.     Follow-Up Instructions: Return in about 5 months (around 05/13/2021) for Sjogren's syndrome, DDD, Fibromyalgia.   Ofilia Neas, PA-C  Note - This record has been created using Dragon software.  Chart creation errors have been sought, but may not always  have been located. Such creation errors do not reflect on  the standard of medical care.

## 2020-11-29 ENCOUNTER — Other Ambulatory Visit (HOSPITAL_COMMUNITY): Payer: Self-pay | Admitting: Psychiatry

## 2020-12-05 ENCOUNTER — Encounter (HOSPITAL_COMMUNITY): Payer: Self-pay | Admitting: Psychiatry

## 2020-12-05 ENCOUNTER — Telehealth (INDEPENDENT_AMBULATORY_CARE_PROVIDER_SITE_OTHER): Payer: Medicare Other | Admitting: Psychiatry

## 2020-12-05 ENCOUNTER — Other Ambulatory Visit: Payer: Self-pay

## 2020-12-05 DIAGNOSIS — F331 Major depressive disorder, recurrent, moderate: Secondary | ICD-10-CM

## 2020-12-05 MED ORDER — ALPRAZOLAM 0.5 MG PO TABS: 0.5000 mg | ORAL_TABLET | Freq: Two times a day (BID) | ORAL | 2 refills | Status: DC

## 2020-12-05 NOTE — Progress Notes (Signed)
Virtual Visit via Video Note  I connected with Alexandria Leach on 12/05/20 at  9:00 AM EDT by a video enabled telemedicine application and verified that I am speaking with the correct person using two identifiers.  Location: Patient: home  Provider: home office   I discussed the limitations of evaluation and management by telemedicine and the availability of in person appointments. The patient expressed understanding and agreed to proceed.     I discussed the assessment and treatment plan with the patient. The patient was provided an opportunity to ask questions and all were answered. The patient agreed with the plan and demonstrated an understanding of the instructions.   The patient was advised to call back or seek an in-person evaluation if the symptoms worsen or if the condition fails to improve as anticipated.  I provided 14 minutes of non-face-to-face time during this encounter.   [name], MD  [facility] MD/PA/NP OP Progress Note  12/05/2020 9:18 AM Alexandria Leach  MRN:  [id number]  Chief Complaint:  Chief Complaint   Anxiety; Depression; Follow-up    HPI: This patient is a 70 year old separated black female who lives alone in [place].  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  She has 4 sons.  The patient returns for follow-up after 3 months.  Since I last saw her she informed me that she could not continue on the Cymbalta because of interactions with Tikosyn the new medication she is taking for atrial flutter.  We had changed her Cymbalta to Paxil.  She is now on the Tikosyn and doing very well.  She is having no more symptoms of atrial fib or flutter and she has been cleared by cardiology.  She could not take the Paxil because it "tore up my stomach."  She is on no antidepressant right now.  She could also not take amitriptyline because of similar problems with the Tikosyn.  The only  medication she is taking for me is the Xanax which helps her anxiety.  The patient denies depressed mood.  Her energy is good.  She is getting out and doing things in the community.  She is sleeping well and eating well.  She seems to be very upbeat today.  We discussed staying off antidepressants for now but she can always call if depression starts to creep back. Visit Diagnosis:    ICD-10-CM   1. Major depressive disorder, recurrent episode, moderate ([place])  F33.1       Past Psychiatric History: none  Past Medical History:  Past Medical History:  Diagnosis Date   Ankle fracture, left 02/19/2015   from a fall   Anxiety    Cataract 2014   corrected with surgery   CHF (congestive heart failure) (HCC)    CKD (chronic kidney disease) stage 3, GFR 30-59 ml/min (HCC)    saw nephrologist Dr Harden Mo   DDD (degenerative disc disease), cervical    Depression    Fibromyalgia    Glaucoma    s/p surgery, sees ophtho Q6 mo   History of DVT (deep vein thrombosis) several times latest 2012   receives coumadin while hospitalized   History of kidney stones 2010   History of pulmonary embolism 2001, 2006   completed coumadin courses   History of rheumatic fever x3   HLD (hyperlipidemia)    HTN (hypertension)    Insomnia    Lung nodule 09/22/2012   RLL - 66mm,  stable since 2014. Thought benign.    Osteoarthritis    shoulders and knees, not RA per Dr Estanislado Pandy, positive ANA, positive Ro   Osteopenia 09/18/2015   DEXA T -1.1 hip, -0.2 spine 08/2015    Personal history of urinary calculi latest 2014   Pneumonia 12/02/2011   PONV (postoperative nausea and vomiting)    Refusal of blood transfusions as patient is Jehovah's Witness    Rheumatic heart disease 1980   s/p mitral valve repair 1980   Sjogren's syndrome ([place])    Trimalleolar fracture of left ankle 02/23/2015    Past Surgical History:  Procedure Laterality Date   BREAST BIOPSY Right 2006   benign   CARDIOVERSION N/A 07/18/2020    Procedure: CARDIOVERSION;  Surgeon: Elouise Munroe, MD;  [facility] ENDOSCOPY;  Service: Cardiovascular;  Laterality: N/A;   CHOLECYSTECTOMY  11/27/2011   Procedure: LAPAROSCOPIC CHOLECYSTECTOMY WITH INTRAOPERATIVE CHOLANGIOGRAM;  Surgeon: Adin Hector, MD;  Location: [facility];  Service: General;  Laterality: N/A;  laparoscopic cholecystectomy with choleangiogram umbilical hernia repair   COLONOSCOPY  07/2014   WNL Amedeo Plenty)   COLONOSCOPY  08/2019   3 TAs, rpt 3 yrs ([place])   dexa  08/2012   normal per patient - no records available   ESOPHAGOGASTRODUODENOSCOPY  08/2019   reactive gastropathy, neg H pylori ([place])   EYE SURGERY Bilateral 2014   cataract removal   MITRAL VALVE REPAIR  1980   open heart   ORIF ANKLE FRACTURE Left 02/26/2015   Procedure: OPEN REDUCTION INTERNAL FIXATION (ORIF) LEFT TRIMALLEOLAR ANKLE FRACTURE;  Surgeon: [name], MD;  Location: [facility];  Service: Orthopedics;  Laterality: Left;   TUBAL LIGATION  1660   UMBILICAL HERNIA REPAIR  11/27/2011   Procedure: HERNIA REPAIR UMBILICAL ADULT;  Surgeon: Adin Hector, MD;  Location: [facility];  Service: General;  Laterality: N/A;   [place]   for fibroids -- partial, ovaries remain    Family Psychiatric History: see below  Family History:  Family History  Problem Relation Age of Onset   Cancer Mother        lung (nonsmoker)   CAD Mother        MI in her 6s   ALS Mother    Kidney disease Father    Alcohol abuse Father    Diabetes Father    Lupus Sister        and niece   Diabetes Sister    Stroke Sister    Depression Sister    Lupus Sister    Blindness Sister    Cancer Maternal Uncle        bone   Stroke Maternal Grandmother    Cancer Brother        bone   Diabetes Brother    Heart attack Brother    Healthy Son    Healthy Son    Healthy Son    Healthy Son    Kidney failure Other        on HD   Breast cancer Neg Hx     Social History:  Social History    Socioeconomic History   Marital status: Married    Spouse name: [name]   Number of children: 4   Years of education: 12   Highest education level: Not on file  Occupational History    Employer: UNEMPLOYED    Comment: Disability  Tobacco Use   Smoking status: Never   Smokeless tobacco: Never  Vaping Use  Vaping Use: Never used  Substance and Sexual Activity   Alcohol use: No    Alcohol/week: 0.0 standard drinks   Drug use: No   Sexual activity: Not Currently    Birth control/protection: Surgical  Other Topics Concern   Not on file  Social History Narrative   Lives with son, 1 dog   Occupation: unemployed, on disability for fibromyalgia since 2008.   Edu: HS   Religion: Jehova's witness   Activity: volunteers at senior center   Diet: some water, fruits/vegetables daily   No caffeine use   Social Determinants of Health   Financial Resource Strain: Not on file  Food Insecurity: Not on file  Transportation Needs: Not on file  Physical Activity: Not on file  Stress: Not on file  Social Connections: Not on file    Allergies:  Allergies  Allergen Reactions   Cymbalta [Duloxetine Hcl] Other (See Comments)    tachycardia   Statins Nausea Only and Other (See Comments)    Muscle cramps also   Sulfa Antibiotics Nausea And Vomiting    Metabolic Disorder Labs: Lab Results  Component Value Date   HGBA1C 6.5 07/11/2020   MPG 114 10/13/2015   No results found for: PROLACTIN Lab Results  Component Value Date   CHOL 167 05/29/2020   TRIG 75 05/29/2020   HDL 71 05/29/2020   CHOLHDL 2.4 05/29/2020   VLDL 14.4 12/23/2019   LDLCALC 82 05/29/2020   LDLCALC 86 12/23/2019   Lab Results  Component Value Date   TSH 4.12 12/23/2019   TSH 1.91 10/06/2018    Therapeutic Level Labs: No results found for: LITHIUM No results found for: VALPROATE No components found for:  CBMZ  Current Medications: Current Outpatient Medications  Medication Sig Dispense Refill    ALPRAZolam (XANAX) 0.5 MG tablet Take 1 tablet (0.5 mg total) by mouth 2 (two) times daily. 180 tablet 2   antiseptic oral rinse (BIOTENE) LIQD 15 mLs by Mouth Rinse route 2 (two) times daily as needed for dry mouth.     apixaban (ELIQUIS) 5 MG TABS tablet TAKE 1 TABLET BY MOUTH  TWICE DAILY 180 tablet 1   cholecalciferol (VITAMIN D) 1000 UNITS tablet Take 1,000 Units by mouth daily.     co-enzyme Q-10 30 MG capsule Take 30 mg by mouth daily.     diclofenac sodium (VOLTAREN) 1 % GEL Apply 1 application topically 2 (two) times daily as needed (pain).     diltiazem (CARDIZEM CD) 360 MG 24 hr capsule TAKE 1 CAPSULE BY MOUTH  DAILY 90 capsule 3   docusate sodium (COLACE) 100 MG capsule Take 100 mg by mouth daily.     dofetilide (TIKOSYN) 250 MCG capsule Take 1 capsule (250 mcg total) by mouth 2 (two) times daily. 180 capsule 1   ezetimibe (ZETIA) 10 MG tablet Take 1 tablet (10 mg total) by mouth at bedtime. 90 tablet 3   furosemide (LASIX) 20 MG tablet TAKE 1 TABLET BY MOUTH  DAILY AS NEEDED FOR EDEMA 90 tablet 3   gabapentin (NEURONTIN) 300 MG capsule Take 2 capsules every morning, 1 capsule every afternoon and 2 capsules at bedtime 450 capsule 3   losartan (COZAAR) 50 MG tablet Take 1 tablet (50 mg total) by mouth daily. 90 tablet 3   lovastatin (MEVACOR) 10 MG tablet TAKE 1 TABLET BY MOUTH  EVERY MONDAY, WEDNESDAY,  AND FRIDAY 39 tablet 3   metoprolol tartrate (LOPRESSOR) 50 MG tablet Take 1.5 tablets (75 mg total) by  mouth 2 (two) times daily. 270 tablet 3   pantoprazole (PROTONIX) 40 MG tablet Take 40 mg by mouth daily.     Polyethyl Glycol-Propyl Glycol (SYSTANE) 0.4-0.3 % GEL ophthalmic gel Place 1 drop into both eyes 2 (two) times daily.      polyethylene glycol powder (GLYCOLAX/MIRALAX) 17 GM/SCOOP powder Take 8.5-17 g by mouth daily as needed for moderate constipation. (1/2-1 capful daily as needed) 3350 g 1   RESTASIS 0.05 % ophthalmic emulsion INSTILL ONE DROP IN BOTH  EYES TWO TIMES DAILY  180 each 0   traMADol (ULTRAM) 50 MG tablet Take 1 tablet (50 mg total) by mouth daily as needed. 30 tablet 0   No current facility-administered medications for this visit.     Musculoskeletal: Strength & Muscle Tone: within normal limits Gait & Station: normal Patient leans: N/A  Psychiatric Specialty Exam: Review of Systems  Musculoskeletal:  Positive for arthralgias and joint swelling.  All other systems reviewed and are negative.  There were no vitals taken for this visit.There is no height or weight on file to calculate BMI.  General Appearance: Casual and Fairly Groomed  Eye Contact:  Good  Speech:  Clear and Coherent  Volume:  Normal  Mood:  Euthymic  Affect:  Appropriate and Congruent  Thought Process:  Goal Directed  Orientation:  Full (Time, Place, and Person)  Thought Content: WDL   Suicidal Thoughts:  No  Homicidal Thoughts:  No  Memory:  Immediate;   Good Recent;   Good Remote;   Good  Judgement:  Good  Insight:  Good  Psychomotor Activity:  Normal  Concentration:  Concentration: Good and Attention Span: Good  Recall:  Good  Fund of Knowledge: Good  Language: Good  Akathisia:  No  Handed:  Right  AIMS (if indicated): not done  Assets:  Communication Skills Desire for Improvement Resilience Social Support Talents/Skills  ADL's:  Intact  Cognition: WNL  Sleep:  Good   Screenings: GAD-7    Flowsheet Row Office Visit from 12/28/2019 in [place] at [facility] Visit from 12/22/2018 in [place] at [facility]  Total GAD-7 Score 0 [place] from 12/11/2017 in [place] at [place] from 12/09/2016 in [place] at [place] from 11/30/2015 in [place] at [facility]  Total Score (max 30 points ) 20 19 18       PHQ2-9    Flowsheet Row Video Visit from 12/05/2020 in [place] Video Visit from 08/18/2020 in [place] Video Visit from 05/24/2020 in [place] Office Visit from 12/28/2019 in [place] at [place] from 12/22/2018 in [place] at [facility]  PHQ-2 Total Score 0 0 0 0 0  PHQ-9 Total Score -- -- -- 1 4      Flowsheet Row Video Visit from 12/05/2020 in [place] Admission (Discharged) from 10/23/2020 in [place] Video Visit from 08/18/2020 in [place] No Risk No Risk No Risk        Assessment and Plan: This patient is a 70 year old female with a history of depression and anxiety.  She continues to do well.  She is totally off antidepressants but is not having any symptoms right now.  She will continue Xanax 0.5 mg twice daily  as needed for anxiety.  She will return to see me in 3 months but call sooner as needed   [name], MD 12/05/2020, 9:19 AM

## 2020-12-07 DIAGNOSIS — H93293 Other abnormal auditory perceptions, bilateral: Secondary | ICD-10-CM | POA: Diagnosis not present

## 2020-12-11 ENCOUNTER — Other Ambulatory Visit: Payer: Self-pay

## 2020-12-11 ENCOUNTER — Encounter: Payer: Self-pay | Admitting: Physician Assistant

## 2020-12-11 ENCOUNTER — Ambulatory Visit (INDEPENDENT_AMBULATORY_CARE_PROVIDER_SITE_OTHER): Payer: Medicare Other | Admitting: Physician Assistant

## 2020-12-11 VITALS — BP 150/76 | HR 75 | Ht 61.0 in | Wt 201.6 lb

## 2020-12-11 DIAGNOSIS — M5030 Other cervical disc degeneration, unspecified cervical region: Secondary | ICD-10-CM

## 2020-12-11 DIAGNOSIS — M797 Fibromyalgia: Secondary | ICD-10-CM | POA: Diagnosis not present

## 2020-12-11 DIAGNOSIS — E041 Nontoxic single thyroid nodule: Secondary | ICD-10-CM

## 2020-12-11 DIAGNOSIS — Z8639 Personal history of other endocrine, nutritional and metabolic disease: Secondary | ICD-10-CM | POA: Diagnosis not present

## 2020-12-11 DIAGNOSIS — Z86711 Personal history of pulmonary embolism: Secondary | ICD-10-CM

## 2020-12-11 DIAGNOSIS — N1831 Chronic kidney disease, stage 3a: Secondary | ICD-10-CM

## 2020-12-11 DIAGNOSIS — Z8679 Personal history of other diseases of the circulatory system: Secondary | ICD-10-CM | POA: Diagnosis not present

## 2020-12-11 DIAGNOSIS — R5383 Other fatigue: Secondary | ICD-10-CM

## 2020-12-11 DIAGNOSIS — M8589 Other specified disorders of bone density and structure, multiple sites: Secondary | ICD-10-CM

## 2020-12-11 DIAGNOSIS — M1711 Unilateral primary osteoarthritis, right knee: Secondary | ICD-10-CM | POA: Diagnosis not present

## 2020-12-11 DIAGNOSIS — M3501 Sicca syndrome with keratoconjunctivitis: Secondary | ICD-10-CM | POA: Diagnosis not present

## 2020-12-11 DIAGNOSIS — Z8659 Personal history of other mental and behavioral disorders: Secondary | ICD-10-CM

## 2020-12-11 DIAGNOSIS — G8929 Other chronic pain: Secondary | ICD-10-CM | POA: Diagnosis not present

## 2020-12-11 DIAGNOSIS — I483 Typical atrial flutter: Secondary | ICD-10-CM | POA: Diagnosis not present

## 2020-12-11 DIAGNOSIS — Z79899 Other long term (current) drug therapy: Secondary | ICD-10-CM

## 2020-12-11 DIAGNOSIS — G4709 Other insomnia: Secondary | ICD-10-CM | POA: Diagnosis not present

## 2020-12-11 NOTE — Patient Instructions (Signed)
Hand Exercises Hand exercises can be helpful for almost anyone. These exercises can strengthen the hands, improve flexibility and movement, and increase blood flow to the hands. These results can make work and daily tasks easier. Hand exercises can be especially helpful for people who have joint pain from arthritis or have nerve damage from overuse (carpal tunnel syndrome). These exercises can also help people who have injured a hand. Exercises Most of these hand exercises are gentle stretching and motion exercises. It is usually safe to do them often throughout the day. Warming up your hands before exercise may help to reduce stiffness. You can do this with gentle massage or by placing your hands in warm water for 10-15 minutes. It is normal to feel some stretching, pulling, tightness, or mild discomfort as you begin new exercises. This will gradually improve. Stop an exercise right away if you feel sudden, severe pain or your pain gets worse. Ask your health care provider which exercises are best for you. Knuckle bend or "claw" fist  Stand or sit with your arm, hand, and all five fingers pointed straight up. Make sure to keep your wrist straight during the exercise. Gently bend your fingers down toward your palm until the tips of your fingers are touching the top of your palm. Keep your big knuckle straight and just bend the small knuckles in your fingers. Hold this position for __________ seconds. Straighten (extend) your fingers back to the starting position. Repeat this exercise 5-10 times with each hand. Full finger fist  Stand or sit with your arm, hand, and all five fingers pointed straight up. Make sure to keep your wrist straight during the exercise. Gently bend your fingers into your palm until the tips of your fingers are touching the middle of your palm. Hold this position for __________ seconds. Extend your fingers back to the starting position, stretching every joint fully. Repeat  this exercise 5-10 times with each hand. Straight fist Stand or sit with your arm, hand, and all five fingers pointed straight up. Make sure to keep your wrist straight during the exercise. Gently bend your fingers at the big knuckle, where your fingers meet your hand, and the middle knuckle. Keep the knuckle at the tips of your fingers straight and try to touch the bottom of your palm. Hold this position for __________ seconds. Extend your fingers back to the starting position, stretching every joint fully. Repeat this exercise 5-10 times with each hand. Tabletop  Stand or sit with your arm, hand, and all five fingers pointed straight up. Make sure to keep your wrist straight during the exercise. Gently bend your fingers at the big knuckle, where your fingers meet your hand, as far down as you can while keeping the small knuckles in your fingers straight. Think of forming a tabletop with your fingers. Hold this position for __________ seconds. Extend your fingers back to the starting position, stretching every joint fully. Repeat this exercise 5-10 times with each hand. Finger spread  Place your hand flat on a table with your palm facing down. Make sure your wrist stays straight as you do this exercise. Spread your fingers and thumb apart from each other as far as you can until you feel a gentle stretch. Hold this position for __________ seconds. Bring your fingers and thumb tight together again. Hold this position for __________ seconds. Repeat this exercise 5-10 times with each hand. Making circles  Stand or sit with your arm, hand, and all five fingers pointed   straight up. Make sure to keep your wrist straight during the exercise. Make a circle by touching the tip of your thumb to the tip of your index finger. Hold for __________ seconds. Then open your hand wide. Repeat this motion with your thumb and each finger on your hand. Repeat this exercise 5-10 times with each hand. Thumb  motion  Sit with your forearm resting on a table and your wrist straight. Your thumb should be facing up toward the ceiling. Keep your fingers relaxed as you move your thumb. Lift your thumb up as high as you can toward the ceiling. Hold for __________ seconds. Bend your thumb across your palm as far as you can, reaching the tip of your thumb for the small finger (pinkie) side of your palm. Hold for __________ seconds. Repeat this exercise 5-10 times with each hand. Grip strengthening  Hold a stress ball or other soft ball in the middle of your hand. Slowly increase the pressure, squeezing the ball as much as you can without causing pain. Think of bringing the tips of your fingers into the middle of your palm. All of your finger joints should bend when doing this exercise. Hold your squeeze for __________ seconds, then relax. Repeat this exercise 5-10 times with each hand. Contact a health care provider if: Your hand pain or discomfort gets much worse when you do an exercise. Your hand pain or discomfort does not improve within 2 hours after you exercise. If you have any of these problems, stop doing these exercises right away. Do not do them again unless your health care provider says that you can. Get help right away if: You develop sudden, severe hand pain or swelling. If this happens, stop doing these exercises right away. Do not do them again unless your health care provider says that you can. This information is not intended to replace advice given to you by your health care provider. Make sure you discuss any questions you have with your health care provider. Document Revised: 06/22/2020 Document Reviewed: 06/22/2020 Elsevier Patient Education  2022 [employer]  

## 2020-12-14 LAB — URINALYSIS, ROUTINE W REFLEX MICROSCOPIC
Bilirubin Urine: NEGATIVE
Glucose, UA: NEGATIVE
Hgb urine dipstick: NEGATIVE
Ketones, ur: NEGATIVE
Leukocytes,Ua: NEGATIVE
Nitrite: NEGATIVE
Protein, ur: NEGATIVE
Specific Gravity, Urine: 1.014 (ref 1.001–1.035)
pH: 5.5 (ref 5.0–8.0)

## 2020-12-14 LAB — ANA: Anti Nuclear Antibody (ANA): NEGATIVE

## 2020-12-14 LAB — CBC WITH DIFFERENTIAL/PLATELET
Absolute Monocytes: 362 cells/uL (ref 200–950)
Basophils Absolute: 31 cells/uL (ref 0–200)
Basophils Relative: 0.6 %
Eosinophils Absolute: 61 cells/uL (ref 15–500)
Eosinophils Relative: 1.2 %
HCT: 39.9 % (ref 35.0–45.0)
Hemoglobin: 12.6 g/dL (ref 11.7–15.5)
Lymphs Abs: 1188 cells/uL (ref 850–3900)
MCH: 26.5 pg — ABNORMAL LOW (ref 27.0–33.0)
MCHC: 31.6 g/dL — ABNORMAL LOW (ref 32.0–36.0)
MCV: 84 fL (ref 80.0–100.0)
MPV: 11.2 fL (ref 7.5–12.5)
Monocytes Relative: 7.1 %
Neutro Abs: 3458 cells/uL (ref 1500–7800)
Neutrophils Relative %: 67.8 %
Platelets: 278 10*3/uL (ref 140–400)
RBC: 4.75 10*6/uL (ref 3.80–5.10)
RDW: 14 % (ref 11.0–15.0)
Total Lymphocyte: 23.3 %
WBC: 5.1 10*3/uL (ref 3.8–10.8)

## 2020-12-14 LAB — PROTEIN ELECTROPHORESIS, SERUM, WITH REFLEX
Albumin ELP: 4.2 g/dL (ref 3.8–4.8)
Alpha 1: 0.4 g/dL — ABNORMAL HIGH (ref 0.2–0.3)
Alpha 2: 0.8 g/dL (ref 0.5–0.9)
Beta 2: 0.5 g/dL (ref 0.2–0.5)
Beta Globulin: 0.5 g/dL (ref 0.4–0.6)
Gamma Globulin: 1.3 g/dL (ref 0.8–1.7)
Total Protein: 7.7 g/dL (ref 6.1–8.1)

## 2020-12-14 LAB — SEDIMENTATION RATE: Sed Rate: 29 mm/h (ref 0–30)

## 2020-12-14 LAB — SJOGRENS SYNDROME-B EXTRACTABLE NUCLEAR ANTIBODY: SSB (La) (ENA) Antibody, IgG: <1 AI

## 2020-12-14 LAB — SJOGRENS SYNDROME-A EXTRACTABLE NUCLEAR ANTIBODY: SSA (Ro) (ENA) Antibody, IgG: >8 AI — AB

## 2020-12-14 NOTE — Progress Notes (Signed)
CBC stable. ESR WNL.  UA normal.  ANA is negative.  Ro antibody remains positive and La antibody is negative.  SPEP did not reveal any abnormal protein bands.   No further recommendations at this time.

## 2020-12-20 ENCOUNTER — Telehealth: Payer: Self-pay

## 2020-12-20 NOTE — Progress Notes (Addendum)
Chronic Care Management Pharmacy Assistant   Name: Alexandria Leach  MRN: [id number] DOB: 1950-07-12  Reason for Encounter: Hypertension Disease State   Recent office visits:  None since last CCM contact  Recent consult visits:  12/11/2020 - Hazel Sams, PA - Patient presented for Sjogren's syndrome with keratoconjunctivitis sicca. Labs: CBC, Serum protein, Sjogren syndrome - A extractable nuclear antibody, ANA, Sedimentation rate, Sjogrens syndrome-B extractable nuclear antibody, and UA.  12/05/2020 - [name], MD - Patient presented for major depressive disorder. No other information given.  11/27/2020 - Joesph July, PA - Patient presented for routine electrophysiology follow-up. EKG ordered.  11/06/2020 - Clint Fenton, PA - Patient presented for follow up for atypical atrial flutter. No medication changes.  10/23/2020 -  Malka So, PA - Patient presented for Atypical atrial flutter. Labs: BMP, Magnesium, EKG.  10/23/2020 - Kirk Ruths, MD - Patient presented for follow up for history of mitral valve repair. No medication changes.  10/11/2020 - Patient Message to [facility] - Stop:  Lexapro (Escitalopram) and Amitriptyline. Start: Paxil (no dosage information given.  10/06/2020 - Cristopher Peru, MD - Patient presented for atrial flutter. Start:  Dofetilide 500 mcg twice a day.   Hospital visits:  Medication Reconciliation was completed by comparing discharge summary, patient's EMR and Pharmacy list, and upon discussion with patient.  Admitted to the hospital on 10/23/2020 due to Atypical Atrial Flutter. Discharge date was 10/26/2020. Discharged from [place]?Medications Started at [facility] Discharge:?? -started dofetilide (TIKOSYN) 250 MCG capsule Take 1 capsule (250 mcg total) by mouth 2 (two) times daily., Starting Thu 10/26/2020, Until Mon 11/06/2020 due to atypical atrial flutter.   Medication Changes at Hospital  Discharge: -Changed lovastatin (MEVACOR) 10 MG tablet TAKE 1 TABLET BY MOUTH EVERY MONDAY, WEDNESDAY, AND Friday -Changed  traMADol (ULTRAM) 50 MG tablet Take 1 tablet (50 mg total) by mouth daily as needed  Medications Discontinued at Hospital Discharge: None noted  Medications that remain the same after Hospital Discharge:??  -All other medications will remain the same.    Medication Reconciliation was completed by comparing discharge summary, patient's EMR and Pharmacy list, and upon discussion with patient.  Medications: Outpatient Encounter Medications as of 12/20/2020  Medication Sig   ALPRAZolam (XANAX) 0.5 MG tablet Take 1 tablet (0.5 mg total) by mouth 2 (two) times daily.   antiseptic oral rinse (BIOTENE) LIQD 15 mLs by Mouth Rinse route 2 (two) times daily as needed for dry mouth.   apixaban (ELIQUIS) 5 MG TABS tablet TAKE 1 TABLET BY MOUTH  TWICE DAILY   cholecalciferol (VITAMIN D) 1000 UNITS tablet Take 1,000 Units by mouth daily.   co-enzyme Q-10 30 MG capsule Take 30 mg by mouth daily.   diclofenac sodium (VOLTAREN) 1 % GEL Apply 1 application topically 2 (two) times daily as needed (pain).   diltiazem (CARDIZEM CD) 360 MG 24 hr capsule TAKE 1 CAPSULE BY MOUTH  DAILY   docusate sodium (COLACE) 100 MG capsule Take 100 mg by mouth daily.   dofetilide (TIKOSYN) 250 MCG capsule Take 1 capsule (250 mcg total) by mouth 2 (two) times daily.   ezetimibe (ZETIA) 10 MG tablet Take 1 tablet (10 mg total) by mouth at bedtime.   furosemide (LASIX) 20 MG tablet TAKE 1 TABLET BY MOUTH  DAILY AS NEEDED FOR EDEMA   gabapentin (NEURONTIN) 300 MG capsule Take 2 capsules every morning, 1 capsule every afternoon and 2 capsules at bedtime  losartan (COZAAR) 50 MG tablet Take 1 tablet (50 mg total) by mouth daily.   lovastatin (MEVACOR) 10 MG tablet TAKE 1 TABLET BY MOUTH  EVERY MONDAY, WEDNESDAY,  AND FRIDAY   metoprolol tartrate (LOPRESSOR) 50 MG tablet Take 1.5 tablets (75 mg total) by mouth  2 (two) times daily.   pantoprazole (PROTONIX) 40 MG tablet Take 40 mg by mouth daily.   Polyethyl Glycol-Propyl Glycol (SYSTANE) 0.4-0.3 % GEL ophthalmic gel Place 1 drop into both eyes 2 (two) times daily.    polyethylene glycol powder (GLYCOLAX/MIRALAX) 17 GM/SCOOP powder Take 8.5-17 g by mouth daily as needed for moderate constipation. (1/2-1 capful daily as needed)   RESTASIS 0.05 % ophthalmic emulsion INSTILL ONE DROP IN BOTH  EYES TWO TIMES DAILY   traMADol (ULTRAM) 50 MG tablet Take 1 tablet (50 mg total) by mouth daily as needed.   No facility-administered encounter medications on file as of 12/20/2020.    Recent Office Vitals: BP Readings from Last 3 Encounters:  12/11/20 (!) 150/76  11/27/20 104/84  11/06/20 (!) 150/82   Pulse Readings from Last 3 Encounters:  12/11/20 75  11/27/20 75  11/06/20 66    Wt Readings from Last 3 Encounters:  12/11/20 201 lb 9.6 oz (91.4 kg)  11/27/20 201 lb 9.6 oz (91.4 kg)  11/06/20 201 lb 6.4 oz (91.4 kg)     Kidney Function Lab Results  Component Value Date/Time   CREATININE 1.35 (H) 11/27/2020 11:26 AM   CREATININE 1.32 (H) 11/06/2020 11:15 AM   CREATININE 1.28 (H) 04/20/2019 03:11 PM   CREATININE 1.27 (H) 01/21/2019 10:32 AM   GFR 39.48 (L) 07/11/2020 12:29 PM   GFRNONAA 44 (L) 11/06/2020 11:15 AM   GFRNONAA 44 (L) 04/20/2019 03:11 PM   GFRAA 51 (L) 11/11/2019 04:26 PM   GFRAA 50 (L) 04/20/2019 03:11 PM    BMP Latest Ref Rng & Units 11/27/2020 11/06/2020 10/26/2020  Glucose 65 - 99 mg/dL 76 98 112(H)  BUN 8 - 27 mg/dL 12 8 9   Creatinine 0.57 - 1.00 mg/dL 1.35(H) 1.32(H) 1.20(H)  BUN/Creat Ratio 12 - 28 9(L) - -  Sodium 134 - 144 mmol/L 140 139 135  Potassium 3.5 - 5.2 mmol/L 4.5 4.1 4.5  Chloride 96 - 106 mmol/L 105 109 106  CO2 20 - 29 mmol/L 23 22 23   Calcium 8.7 - 10.3 mg/dL 9.6 9.9 9.3   Attempted contact with patient 3 times on 10/5, 10/7 and 10/12. Unsuccessful outreach. Will attempt contact next month.   Current  antihypertensive regimen:   Diltiazem 360 mg 24 hr - 1 capsule daily  Metoprolol tartrate 50 mg - 1 and 1/2 tablets (75 mg) twice daily   Losartan 50 mg - 1 tablet daily   Lasix 20mg  take 1 tablet daily as needed (patient taking differently: 2 tablets daily as needed)   Adherence Review: Is the patient currently on ACE/ARB medication? Yes Does the patient have >5 day gap between last estimated fill dates? No  Star Rating Drugs:  Medication:  Last Fill: Day Supply Losartan 50mg  11/18/2020 90  Lovastatin 10mg   11/07/2020 90 Verified fill date with [facility]  Care Gaps: Annual wellness visit in last year? Yes 12/28/2019 Most Recent BP reading: 150/76 on 12/11/2020  PCP appointment on 01/05/2021 for lab work, 10/262022 for AWV and 01/12/21 for Physical.  Debbora Dus, CPP notified  Marijean Niemann, [place] Assistant [phone]   I have reviewed the care management and care coordination activities outlined in  this encounter and I am certifying that I agree with the content of this note. No further action required.  Debbora Dus, PharmD Clinical Pharmacist [facility] at [facility] [phone]

## 2020-12-22 ENCOUNTER — Encounter: Payer: Self-pay | Admitting: Family Medicine

## 2020-12-25 ENCOUNTER — Telehealth: Payer: Self-pay | Admitting: Family Medicine

## 2020-12-25 NOTE — Telephone Encounter (Signed)
LVM for pt to rtn my call to r/s appt with NHA on 01/10/21

## 2020-12-30 ENCOUNTER — Other Ambulatory Visit: Payer: Self-pay | Admitting: Family Medicine

## 2020-12-30 DIAGNOSIS — N1832 Chronic kidney disease, stage 3b: Secondary | ICD-10-CM

## 2020-12-30 DIAGNOSIS — G6289 Other specified polyneuropathies: Secondary | ICD-10-CM

## 2020-12-30 DIAGNOSIS — R7303 Prediabetes: Secondary | ICD-10-CM

## 2020-12-30 DIAGNOSIS — R7989 Other specified abnormal findings of blood chemistry: Secondary | ICD-10-CM

## 2020-12-30 DIAGNOSIS — N2581 Secondary hyperparathyroidism of renal origin: Secondary | ICD-10-CM

## 2020-12-30 DIAGNOSIS — E7800 Pure hypercholesterolemia, unspecified: Secondary | ICD-10-CM

## 2020-12-30 DIAGNOSIS — M3501 Sicca syndrome with keratoconjunctivitis: Secondary | ICD-10-CM

## 2021-01-03 ENCOUNTER — Other Ambulatory Visit: Payer: Self-pay | Admitting: Family Medicine

## 2021-01-04 NOTE — Telephone Encounter (Signed)
Gabapentin Last filled:  11/09/20, #450 Last OV: 07/11/20, [facility] nurse visit f/u Next OV: 01/12/21, AWV

## 2021-01-05 ENCOUNTER — Other Ambulatory Visit: Payer: Medicare Other

## 2021-01-10 ENCOUNTER — Ambulatory Visit: Payer: Medicare Other

## 2021-01-12 ENCOUNTER — Ambulatory Visit (INDEPENDENT_AMBULATORY_CARE_PROVIDER_SITE_OTHER): Payer: Medicare Other | Admitting: Family Medicine

## 2021-01-12 ENCOUNTER — Encounter: Payer: Self-pay | Admitting: Family Medicine

## 2021-01-12 ENCOUNTER — Other Ambulatory Visit: Payer: Self-pay

## 2021-01-12 VITALS — BP 136/72 | HR 62 | Temp 97.6°F | Ht 62.0 in | Wt 199.4 lb

## 2021-01-12 DIAGNOSIS — R7303 Prediabetes: Secondary | ICD-10-CM | POA: Diagnosis not present

## 2021-01-12 DIAGNOSIS — E538 Deficiency of other specified B group vitamins: Secondary | ICD-10-CM | POA: Diagnosis not present

## 2021-01-12 DIAGNOSIS — Z9889 Other specified postprocedural states: Secondary | ICD-10-CM

## 2021-01-12 DIAGNOSIS — G6289 Other specified polyneuropathies: Secondary | ICD-10-CM

## 2021-01-12 DIAGNOSIS — Z0000 Encounter for general adult medical examination without abnormal findings: Secondary | ICD-10-CM

## 2021-01-12 DIAGNOSIS — I483 Typical atrial flutter: Secondary | ICD-10-CM

## 2021-01-12 DIAGNOSIS — R7989 Other specified abnormal findings of blood chemistry: Secondary | ICD-10-CM | POA: Diagnosis not present

## 2021-01-12 DIAGNOSIS — E7800 Pure hypercholesterolemia, unspecified: Secondary | ICD-10-CM

## 2021-01-12 DIAGNOSIS — M797 Fibromyalgia: Secondary | ICD-10-CM

## 2021-01-12 DIAGNOSIS — N1832 Chronic kidney disease, stage 3b: Secondary | ICD-10-CM

## 2021-01-12 DIAGNOSIS — N2581 Secondary hyperparathyroidism of renal origin: Secondary | ICD-10-CM

## 2021-01-12 DIAGNOSIS — M85852 Other specified disorders of bone density and structure, left thigh: Secondary | ICD-10-CM

## 2021-01-12 DIAGNOSIS — I10 Essential (primary) hypertension: Secondary | ICD-10-CM

## 2021-01-12 DIAGNOSIS — Z7189 Other specified counseling: Secondary | ICD-10-CM

## 2021-01-12 DIAGNOSIS — Z531 Procedure and treatment not carried out because of patient's decision for reasons of belief and group pressure: Secondary | ICD-10-CM

## 2021-01-12 DIAGNOSIS — M3501 Sicca syndrome with keratoconjunctivitis: Secondary | ICD-10-CM

## 2021-01-12 DIAGNOSIS — I739 Peripheral vascular disease, unspecified: Secondary | ICD-10-CM

## 2021-01-12 DIAGNOSIS — I5032 Chronic diastolic (congestive) heart failure: Secondary | ICD-10-CM

## 2021-01-12 DIAGNOSIS — Z66 Do not resuscitate: Secondary | ICD-10-CM

## 2021-01-12 NOTE — Assessment & Plan Note (Signed)
Update levels on low dose lovastatin MWF + zetia.  The 10-year ASCVD risk score (Arnett DK, et al., 2019) is: 10.3%   Values used to calculate the score:     Age: 70 years     Sex: Female     Is Non-Hispanic African American: Yes     Diabetic: No     Tobacco smoker: No     Systolic Blood Pressure: 675 mmHg     Is BP treated: Yes     HDL Cholesterol: 71 mg/dL     Total Cholesterol: 167 mg/dL

## 2021-01-12 NOTE — Assessment & Plan Note (Signed)
Update kidney function.  Previously saw renal Harden Mo) 2015

## 2021-01-12 NOTE — Assessment & Plan Note (Signed)
Latest DEXA 2017. Consider updating next year.

## 2021-01-12 NOTE — Assessment & Plan Note (Addendum)
Continues eliquis. Recently started on tikosyn.  Followed by cardiology and EP

## 2021-01-12 NOTE — Assessment & Plan Note (Signed)
Update renal function and PTH.

## 2021-01-12 NOTE — Assessment & Plan Note (Signed)
Advanced directive discussion - brought forms 2017. Son Tamberly Pomplun is HCPOA then [place]. Clear she does not want CPR, no intubation. Has DNR form at home. Jehovah's witness - no blood products.

## 2021-01-12 NOTE — Assessment & Plan Note (Signed)

## 2021-01-12 NOTE — Assessment & Plan Note (Signed)
Update A1c ?

## 2021-01-12 NOTE — Progress Notes (Signed)
Patient ID: Aireonna Bauer, female    DOB: [birth year], 70 y.o.   MRN: [id number]  This visit was conducted in person.  BP 136/72   Pulse 62   Temp 97.6 F (36.4 C) (Temporal)   Ht 5\' 2"  (1.575 m)   Wt 199 lb 6 oz (90.4 kg)   SpO2 100%   BMI 36.47 kg/m    CC: AMW Subjective:   HPI: Naomia Lenderman is a 70 y.o. female presenting on 01/12/2021 for Medicare Wellness   Did not see health advisor this year.   Hearing Screening   500Hz  1000Hz  2000Hz  4000Hz   Right ear 40 40 40 25  Left ear 20 25 20 20    Vision Screening   Right eye Left eye Both eyes  Without correction 20/25 20/50 20/25   With correction       Flowsheet Row Office Visit from 12/28/2019 in [facility] at [facility]  PHQ-2 Total Score 0       Fall Risk  01/12/2021 12/28/2019 12/21/2018 12/11/2017 12/09/2016  Falls in the past year? 0 0 1 Yes No  Comment - - - lost balance and fell in August 2019 -  Number falls in past yr: - - 1 1 -  Comment - - - - -  Injury with Fall? - - 0 Yes -  Comment - - - laceration to forehead; treatment in urgent care -  Risk Factor Category  - - - - -  Comment - - - - -  Risk for fall due to : - - History of fall(s);Medication side effect;Impaired balance/gait;Impaired mobility - -  Follow up - - Falls evaluation completed;Falls prevention discussed - -    Known chronic HFpEF with mitral stenosis/regurg s/p repair rec SBE ppx.  Typical atrial flutter - sees EP Lovena Le), renally dosed dofetilide (Tikosyn) is best option for her.  Sees rheum for sjogren's with keratoconjunctivitis sicca (ANA+, Ro+) and FM.    Preventative: COLONOSCOPY 07/2014; WNL Amedeo Plenty)  COLONOSCOPY 08/2019 - 3 TAs, rpt 3 yrs ([place])  [facility]  Well woman exam - s/p hysterectomy, ovaries remain. No pelvic pain, vaginal bleeding. Pelvic exam normal 2017.  DEXA T -1.1 hip, -0.2 spine 08/2015 - consider updating over next few years  Lung cancer  screening - not eligible  Flu shot - declines  COVID vaccine - [place] 04/2019, 05/2019, booster 12/2019  Tetanus shot - will check with insurance  Pneumovax 2014, prevnar-13 2019, pneumovax 12/2018 Shingrix - discussed, declines  Advanced directive discussion - brought forms 2017. Son Inita Uram is HCPOA then [place]. Clear she does not want CPR, no intubation. Has DNR form at home. Jehovah's witness - no blood products.  Seat belt use discussed Sunscreen use and skin screen discussed  Non smoker  Alcohol - none  Dentist yearly - due  Eye exam yearly  Bowel - occasional constipation despite daily colace, miralax PRN Bladder - no incontinence    Lives with son, 1 dog Occupation: unemployed, on disability for fibromyalgia since 2008. Edu: HS Religion: Jehova's witness Activity: walks some  Diet: good water, fruits/vegetables some  No caffeine use     Relevant past medical, surgical, family and social history reviewed and updated as indicated. Interim medical history since our last visit reviewed. Allergies and medications reviewed and updated. Outpatient Medications Prior to Visit  Medication Sig Dispense Refill   ALPRAZolam (XANAX) 0.5 MG tablet Take 1 tablet (0.5 mg total) by mouth 2 (two)  times daily. 180 tablet 2   antiseptic oral rinse (BIOTENE) LIQD 15 mLs by Mouth Rinse route 2 (two) times daily as needed for dry mouth.     apixaban (ELIQUIS) 5 MG TABS tablet TAKE 1 TABLET BY MOUTH  TWICE DAILY 180 tablet 1   cholecalciferol (VITAMIN D) 1000 UNITS tablet Take 1,000 Units by mouth daily.     co-enzyme Q-10 30 MG capsule Take 30 mg by mouth daily.     diclofenac sodium (VOLTAREN) 1 % GEL Apply 1 application topically 2 (two) times daily as needed (pain).     diltiazem (CARDIZEM CD) 360 MG 24 hr capsule TAKE 1 CAPSULE BY MOUTH  DAILY 90 capsule 3   docusate sodium (COLACE) 100 MG capsule Take 100 mg by mouth daily.     dofetilide (TIKOSYN) 250 MCG capsule Take 1 capsule (250  mcg total) by mouth 2 (two) times daily. 180 capsule 1   ezetimibe (ZETIA) 10 MG tablet Take 1 tablet (10 mg total) by mouth at bedtime. 90 tablet 3   furosemide (LASIX) 20 MG tablet TAKE 1 TABLET BY MOUTH  DAILY AS NEEDED FOR EDEMA 90 tablet 3   gabapentin (NEURONTIN) 300 MG capsule TAKE 2 CAPSULES BY MOUTH IN THE MORNING , 1 CAPSULE IN  THE AFTERNOON AND 2  CAPSULES AT BEDTIME 450 capsule 3   lovastatin (MEVACOR) 10 MG tablet TAKE 1 TABLET BY MOUTH  EVERY MONDAY, WEDNESDAY,  AND FRIDAY 39 tablet 3   metoprolol tartrate (LOPRESSOR) 50 MG tablet Take 1.5 tablets (75 mg total) by mouth 2 (two) times daily. 270 tablet 3   pantoprazole (PROTONIX) 40 MG tablet Take 40 mg by mouth daily.     Polyethyl Glycol-Propyl Glycol (SYSTANE) 0.4-0.3 % GEL ophthalmic gel Place 1 drop into both eyes 2 (two) times daily.      polyethylene glycol powder (GLYCOLAX/MIRALAX) 17 GM/SCOOP powder Take 8.5-17 g by mouth daily as needed for moderate constipation. (1/2-1 capful daily as needed) 3350 g 1   RESTASIS 0.05 % ophthalmic emulsion INSTILL ONE DROP IN BOTH  EYES TWO TIMES DAILY 180 each 0   traMADol (ULTRAM) 50 MG tablet Take 1 tablet (50 mg total) by mouth daily as needed. 30 tablet 0   losartan (COZAAR) 50 MG tablet Take 1 tablet (50 mg total) by mouth daily. 90 tablet 3   No facility-administered medications prior to visit.     Per HPI unless specifically indicated in ROS section below Review of Systems  Constitutional:  Negative for activity change, appetite change, chills, fatigue, fever and unexpected weight change.  HENT:  Negative for hearing loss.   Eyes:  Negative for visual disturbance.  Respiratory:  Negative for cough, chest tightness, shortness of breath and wheezing.   Cardiovascular:  Negative for chest pain, palpitations and leg swelling.  Gastrointestinal:  Negative for abdominal distention, abdominal pain, blood in stool, constipation, diarrhea, nausea and vomiting.  Genitourinary:  Negative  for difficulty urinating and hematuria.  Musculoskeletal:  Negative for arthralgias, myalgias and neck pain.  Skin:  Negative for rash.  Neurological:  Negative for dizziness, seizures, syncope and headaches.  Hematological:  Negative for adenopathy. Bruises/bleeds easily.  Psychiatric/Behavioral:  Negative for dysphoric mood. The patient is not nervous/anxious.    Objective:  BP 136/72   Pulse 62   Temp 97.6 F (36.4 C) (Temporal)   Ht 5\' 2"  (1.575 m)   Wt 199 lb 6 oz (90.4 kg)   SpO2 100%   BMI 36.47 kg/m  Wt Readings from Last 3 Encounters:  01/12/21 199 lb 6 oz (90.4 kg)  12/11/20 201 lb 9.6 oz (91.4 kg)  11/27/20 201 lb 9.6 oz (91.4 kg)      Physical Exam Vitals and nursing note reviewed.  Constitutional:      Appearance: Normal appearance. She is not ill-appearing.  HENT:     Head: Normocephalic and atraumatic.     Right Ear: Tympanic membrane, ear canal and external ear normal. There is no impacted cerumen.     Left Ear: Tympanic membrane, ear canal and external ear normal. There is no impacted cerumen.  Eyes:     General:        Right eye: No discharge.        Left eye: No discharge.     Extraocular Movements: Extraocular movements intact.     Conjunctiva/sclera: Conjunctivae normal.     Pupils: Pupils are equal, round, and reactive to light.  Neck:     Thyroid: No thyroid mass or thyromegaly.     Vascular: No carotid bruit.  Cardiovascular:     Rate and Rhythm: Normal rate and regular rhythm.     Pulses: Normal pulses.     Heart sounds: Normal heart sounds. No murmur heard. Pulmonary:     Effort: Pulmonary effort is normal. No respiratory distress.     Breath sounds: Normal breath sounds. No wheezing, rhonchi or rales.  Abdominal:     General: Bowel sounds are normal. There is no distension.     Palpations: Abdomen is soft. There is no mass.     Tenderness: There is no abdominal tenderness. There is no guarding or rebound.     Hernia: No hernia is  present.  Musculoskeletal:     Cervical back: Normal range of motion and neck supple. No rigidity.     Right lower leg: No edema.     Left lower leg: No edema.  Lymphadenopathy:     Cervical: No cervical adenopathy.  Skin:    General: Skin is warm and dry.     Findings: No rash.  Neurological:     General: No focal deficit present.     Mental Status: She is alert. Mental status is at baseline.     Comments:  Recall 3/3 Calculation 5/5 DLROW   Psychiatric:        Mood and Affect: Mood normal.        Behavior: Behavior normal.      Results for orders placed or performed in visit on 12/11/20  CBC with Differential/Platelet  Result Value Ref Range   WBC 5.1 3.8 - 10.8 Thousand/uL   RBC 4.75 3.80 - 5.10 Million/uL   Hemoglobin 12.6 11.7 - 15.5 g/dL   HCT 39.9 35.0 - 45.0 %   MCV 84.0 80.0 - 100.0 fL   MCH 26.5 (L) 27.0 - 33.0 pg   MCHC 31.6 (L) 32.0 - 36.0 g/dL   RDW 14.0 11.0 - 15.0 %   Platelets 278 140 - 400 Thousand/uL   MPV 11.2 7.5 - 12.5 fL   Neutro Abs 3,458 1,500 - 7,800 cells/uL   Lymphs Abs 1,188 850 - 3,900 cells/uL   Absolute Monocytes 362 200 - 950 cells/uL   Eosinophils Absolute 61 15 - 500 cells/uL   Basophils Absolute 31 0 - 200 cells/uL   Neutrophils Relative % 67.8 %   Total Lymphocyte 23.3 %   Monocytes Relative 7.1 %   Eosinophils Relative 1.2 %   Basophils Relative 0.6 %  Urinalysis, Routine w reflex microscopic  Result Value Ref Range   Color, Urine YELLOW YELLOW   APPearance CLEAR CLEAR   Specific Gravity, Urine 1.014 1.001 - 1.035   pH 5.5 5.0 - 8.0   Glucose, UA NEGATIVE NEGATIVE   Bilirubin Urine NEGATIVE NEGATIVE   Ketones, ur NEGATIVE NEGATIVE   Hgb urine dipstick NEGATIVE NEGATIVE   Protein, ur NEGATIVE NEGATIVE   Nitrite NEGATIVE NEGATIVE   Leukocytes,Ua NEGATIVE NEGATIVE  Sedimentation rate  Result Value Ref Range   Sed Rate 29 0 - 30 mm/h  ANA  Result Value Ref Range   Anti Nuclear Antibody (ANA) NEGATIVE NEGATIVE  Sjogrens  syndrome-A extractable nuclear antibody  Result Value Ref Range   SSA (Ro) (ENA) Antibody, IgG >8.0 POS (A) <1.0 NEG AI  Sjogrens syndrome-B extractable nuclear antibody  Result Value Ref Range   SSB (La) (ENA) Antibody, IgG <1.0 NEG <1.0 NEG AI  Serum protein electrophoresis with reflex  Result Value Ref Range   Total Protein 7.7 6.1 - 8.1 g/dL   Albumin ELP 4.2 3.8 - 4.8 g/dL   Alpha 1 0.4 (H) 0.2 - 0.3 g/dL   Alpha 2 0.8 0.5 - 0.9 g/dL   Beta Globulin 0.5 0.4 - 0.6 g/dL   Beta 2 0.5 0.2 - 0.5 g/dL   Gamma Globulin 1.3 0.8 - 1.7 g/dL   SPE Interp.      Assessment & Plan:  This visit occurred during the SARS-CoV-2 public health emergency.  Safety protocols were in place, including screening questions prior to the visit, additional usage of staff PPE, and extensive cleaning of exam room while observing appropriate contact time as indicated for disinfecting solutions.   Problem List Items Addressed This Visit     Refusal of blood transfusions as patient is Jehovah's Witness (Chronic)   Health maintenance examination (Chronic)    Preventative protocols reviewed and updated unless pt declined. Discussed healthy diet and lifestyle.       Advanced care planning/counseling discussion (Chronic)    Advanced directive discussion - brought forms 2017. Son Melisssa Donner is HCPOA then [place]. Clear she does not want CPR, no intubation. Has DNR form at home. Jehovah's witness - no blood products.       Medicare annual wellness visit, subsequent - Primary (Chronic)    I have personally reviewed the Medicare Annual Wellness questionnaire and have noted 1. The patient's medical and social history 2. Their use of alcohol, tobacco or illicit drugs 3. Their current medications and supplements 4. The patient's functional ability including ADL's, fall risks, home safety risks and hearing or visual impairment. Cognitive function has been assessed and addressed as indicated.  5. Diet and physical  activity 6. Evidence for depression or mood disorders The patients weight, height, BMI have been recorded in the chart. I have made referrals, counseling and provided education to the patient based on review of the above and I have provided the pt with a written personalized care plan for preventive services. Provider list updated.. See scanned questionairre as needed for further documentation. Reviewed preventative protocols and updated unless pt declined.       DNR no code (do not resuscitate) (Chronic)   HYPERCHOLESTEROLEMIA    Update levels on low dose lovastatin MWF + zetia.  The 10-year ASCVD risk score (Arnett DK, et al., 2019) is: 10.3%   Values used to calculate the score:     Age: 51 years     Sex: Female     Is  Non-Hispanic African American: Yes     Diabetic: No     Tobacco smoker: No     Systolic Blood Pressure: 163 mmHg     Is BP treated: Yes     HDL Cholesterol: 71 mg/dL     Total Cholesterol: 167 mg/dL       Relevant Orders   Lipid panel   Comprehensive metabolic panel   Essential hypertension    Chronic, stable. Continue current regimen.       History of mitral valve repair   (HFpEF) heart failure with preserved ejection fraction (HCC)    Chronic, followed by cardiology and EP.       Sjogren's syndrome ([place])    Appreciate rheum care.       CKD (chronic kidney disease) stage 3, GFR 30-59 ml/min (HCC)    Update kidney function.  Previously saw renal Harden Mo) 2015       Relevant Orders   Comprehensive metabolic panel   Parathyroid hormone, intact (no Ca)   Fibromyalgia    Currently in flare.  Continue gabapentin and amitriptyline      Osteopenia    Latest DEXA 2017. Consider updating next year.       Peripheral neuropathy   Relevant Orders   Vitamin B12   Vitamin B1   Folate   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HCC)    Continues aspirin ,statin.       Abnormal serum thyroid stimulating hormone (TSH) level   Relevant Orders   TSH   T4, free    Secondary hyperparathyroidism of renal origin ([place])    Update renal function and PTH.       Relevant Orders   Parathyroid hormone, intact (no Ca)   Atrial flutter (HCC)    Continues eliquis. Recently started on tikosyn.  Followed by cardiology and EP      Prediabetes    Update A1c.       Relevant Orders   Hemoglobin A1c     No orders of the defined types were placed in this encounter.  Orders Placed This Encounter  Procedures   Vitamin B12   Vitamin B1   TSH   T4, free   Lipid panel   Comprehensive metabolic panel   Folate   Hemoglobin A1c   Parathyroid hormone, intact (no Ca)     Patient instructions: Call to schedule dentist appointment.  Labs today  Consider shingrix vaccine - check with insurance about coverage.  Good to see you today, return as needed or in 6 months for follow up visit.   Follow up plan: Return in about 6 months (around 07/13/2021) for follow up visit.  Ria Bush, MD

## 2021-01-12 NOTE — Assessment & Plan Note (Signed)
Appreciate rheum care.

## 2021-01-12 NOTE — Patient Instructions (Addendum)
Call to schedule dentist appointment.  Labs today  Consider shingrix vaccine - check with insurance about coverage.  Good to see you today, return as needed or in 6 months for follow up visit.   Health Maintenance After Age 68 After age 65, you are at a higher risk for certain long-term diseases and infections as well as injuries from falls. Falls are a major cause of broken bones and head injuries in people who are older than age 56. Getting regular preventive care can help to keep you healthy and well. Preventive care includes getting regular testing and making lifestyle changes as recommended by your health care provider. Talk with your health care provider about: Which screenings and tests you should have. A screening is a test that checks for a disease when you have no symptoms. A diet and exercise plan that is right for you. What should I know about screenings and tests to prevent falls? Screening and testing are the best ways to find a health problem early. Early diagnosis and treatment give you the best chance of managing medical conditions that are common after age 82. Certain conditions and lifestyle choices may make you more likely to have a fall. Your health care provider may recommend: Regular vision checks. Poor vision and conditions such as cataracts can make you more likely to have a fall. If you wear glasses, make sure to get your prescription updated if your vision changes. Medicine review. Work with your health care provider to regularly review all of the medicines you are taking, including over-the-counter medicines. Ask your health care provider about any side effects that may make you more likely to have a fall. Tell your health care provider if any medicines that you take make you feel dizzy or sleepy. Osteoporosis screening. Osteoporosis is a condition that causes the bones to get weaker. This can make the bones weak and cause them to break more easily. Blood pressure screening.  Blood pressure changes and medicines to control blood pressure can make you feel dizzy. Strength and balance checks. Your health care provider may recommend certain tests to check your strength and balance while standing, walking, or changing positions. Foot health exam. Foot pain and numbness, as well as not wearing proper footwear, can make you more likely to have a fall. Depression screening. You may be more likely to have a fall if you have a fear of falling, feel emotionally low, or feel unable to do activities that you used to do. Alcohol use screening. Using too much alcohol can affect your balance and may make you more likely to have a fall. What actions can I take to lower my risk of falls? General instructions Talk with your health care provider about your risks for falling. Tell your health care provider if: You fall. Be sure to tell your health care provider about all falls, even ones that seem minor. You feel dizzy, sleepy, or off-balance. Take over-the-counter and prescription medicines only as told by your health care provider. These include any supplements. Eat a healthy diet and maintain a healthy weight. A healthy diet includes low-fat dairy products, low-fat (lean) meats, and fiber from whole grains, beans, and lots of fruits and vegetables. Home safety Remove any tripping hazards, such as rugs, cords, and clutter. Install safety equipment such as grab bars in bathrooms and safety rails on stairs. Keep rooms and walkways well-lit. Activity  Follow a regular exercise program to stay fit. This will help you maintain your balance. Ask your  health care provider what types of exercise are appropriate for you. If you need a cane or walker, use it as recommended by your health care provider. Wear supportive shoes that have nonskid soles. Lifestyle Do not drink alcohol if your health care provider tells you not to drink. If you drink alcohol, limit how much you have: 0-1 drink a  day for women. 0-2 drinks a day for men. Be aware of how much alcohol is in your drink. In the [place] one drink equals one typical bottle of beer (12 oz), one-half glass of wine (5 oz), or one shot of hard liquor (1 oz). Do not use any products that contain nicotine or tobacco, such as cigarettes and e-cigarettes. If you need help quitting, ask your health care provider. Summary Having a healthy lifestyle and getting preventive care can help to protect your health and wellness after age 37. Screening and testing are the best way to find a health problem early and help you avoid having a fall. Early diagnosis and treatment give you the best chance for managing medical conditions that are more common for people who are older than age 48. Falls are a major cause of broken bones and head injuries in people who are older than age 54. Take precautions to prevent a fall at home. Work with your health care provider to learn what changes you can make to improve your health and wellness and to prevent falls. This information is not intended to replace advice given to you by your health care provider. Make sure you discuss any questions you have with your health care provider. Document Revised: 05/12/2020 Document Reviewed: 02/18/2020 Elsevier Patient Education  2022 [employer].

## 2021-01-12 NOTE — Assessment & Plan Note (Signed)
Chronic, stable. Continue current regimen. 

## 2021-01-12 NOTE — Assessment & Plan Note (Signed)
Preventative protocols reviewed and updated unless pt declined. Discussed healthy diet and lifestyle.  

## 2021-01-12 NOTE — Assessment & Plan Note (Signed)
Continues aspirin, statin.  

## 2021-01-12 NOTE — Assessment & Plan Note (Signed)
Currently in flare.  Continue gabapentin and amitriptyline

## 2021-01-12 NOTE — Assessment & Plan Note (Signed)
Chronic, followed by cardiology and EP.

## 2021-01-13 ENCOUNTER — Encounter: Payer: Self-pay | Admitting: Family Medicine

## 2021-01-13 ENCOUNTER — Other Ambulatory Visit: Payer: Self-pay | Admitting: Family Medicine

## 2021-01-13 DIAGNOSIS — E538 Deficiency of other specified B group vitamins: Secondary | ICD-10-CM

## 2021-01-13 HISTORY — DX: Deficiency of other specified B group vitamins: E53.8

## 2021-01-13 MED ORDER — VITAMIN B-12 1000 MCG PO TABS: 1000.0000 ug | ORAL_TABLET | ORAL | Status: DC

## 2021-01-13 MED ORDER — VITAMIN B-12 1000 MCG PO TABS: 1000.0000 ug | ORAL_TABLET | Freq: Every day | ORAL | Status: DC

## 2021-01-15 ENCOUNTER — Encounter: Payer: Self-pay | Admitting: Rheumatology

## 2021-01-16 MED ORDER — TRAMADOL HCL 50 MG PO TABS: 50.0000 mg | ORAL_TABLET | Freq: Every day | ORAL | 0 refills | Status: DC | PRN

## 2021-01-16 NOTE — Telephone Encounter (Signed)
Next Visit: 05/14/2021  Last Visit: 12/11/2020  UDS:09/21/2020 UDS is negative for tramadol.  Patient takes tramadol only on as needed basis.  Narc Agreement: 12/11/2020  Last Fill: 09/20/2020  Okay to refill Tramadol?

## 2021-01-17 ENCOUNTER — Telehealth: Payer: Self-pay

## 2021-01-17 LAB — COMPREHENSIVE METABOLIC PANEL
AG Ratio: 1.5 (calc) (ref 1.0–2.5)
ALT: 15 U/L (ref 6–29)
AST: 20 U/L (ref 10–35)
Albumin: 4.4 g/dL (ref 3.6–5.1)
Alkaline phosphatase (APISO): 68 U/L (ref 37–153)
BUN/Creatinine Ratio: 10 (calc) (ref 6–22)
BUN: 13 mg/dL (ref 7–25)
CO2: 22 mmol/L (ref 20–32)
Calcium: 10.2 mg/dL (ref 8.6–10.4)
Chloride: 107 mmol/L (ref 98–110)
Creat: 1.29 mg/dL — ABNORMAL HIGH (ref 0.50–1.05)
Globulin: 3 g/dL (calc) (ref 1.9–3.7)
Glucose, Bld: 93 mg/dL (ref 65–99)
Potassium: 4 mmol/L (ref 3.5–5.3)
Sodium: 140 mmol/L (ref 135–146)
Total Bilirubin: 0.5 mg/dL (ref 0.2–1.2)
Total Protein: 7.4 g/dL (ref 6.1–8.1)

## 2021-01-17 LAB — LIPID PANEL
Cholesterol: 193 mg/dL (ref <200)
HDL: 70 mg/dL (ref >=50)
LDL Cholesterol (Calc): 104 mg/dL (calc) — ABNORMAL HIGH
Non-HDL Cholesterol (Calc): 123 mg/dL (calc) (ref <130)
Total CHOL/HDL Ratio: 2.8 (calc) (ref <5.0)
Triglycerides: 93 mg/dL (ref <150)

## 2021-01-17 LAB — EXTRA SPECIMEN

## 2021-01-17 LAB — VITAMIN B1: Vitamin B1 (Thiamine): 7 nmol/L — ABNORMAL LOW (ref 8–30)

## 2021-01-17 LAB — PARATHYROID HORMONE, INTACT (NO CA): PTH: 39 pg/mL (ref 16–77)

## 2021-01-17 LAB — FOLATE: Folate: 7.2 ng/mL

## 2021-01-17 LAB — VITAMIN B12: Vitamin B-12: 312 pg/mL (ref 200–1100)

## 2021-01-17 LAB — TSH: TSH: 1.49 mIU/L (ref 0.40–4.50)

## 2021-01-17 LAB — HEMOGLOBIN A1C
Hgb A1c MFr Bld: 6 % of total Hgb — ABNORMAL HIGH (ref <5.7)
Mean Plasma Glucose: 126 mg/dL
eAG (mmol/L): 7 mmol/L

## 2021-01-17 LAB — T4, FREE: Free T4: 1.1 ng/dL (ref 0.8–1.8)

## 2021-01-17 NOTE — Progress Notes (Signed)
HPI: FU mitral valve repair secondary to rheumatic heart disease and atrial flutter. Patient is status post mitral valve repair in 1980. Operative report not available. Nuclear study 11/14 showed EF 66 and normal perfusion. Holter 11/14 showed sinus with pacs, pvcs and brief PAT. Carotid Dopplers October 2019 showed no significant obstruction. Patient found to be in atrial flutter at last office visit.  Patient underwent successful cardioversion in May 2022.  Atrial flutter recurred.  She was seen by Dr. Lovena Le and Phyllis Ginger recommended.  Echocardiogram August 2022 showed normal LV function, mild left ventricular hypertrophy, grade 2 diastolic dysfunction, mild left atrial enlargement, mild mitral stenosis with mean gradient 7 mmHg, mild mitral regurgitation, mild aortic stenosis with mean gradient 12 mmHg, mild to moderate aortic insufficiency.  Since last seen, the patient has dyspnea with more extreme activities but not with routine activities. It is relieved with rest. It is not associated with chest pain. There is no orthopnea, PND or pedal edema. There is no syncope or palpitations. There is no exertional chest pain.   Current Outpatient Medications  Medication Sig Dispense Refill   ALPRAZolam (XANAX) 0.5 MG tablet Take 1 tablet (0.5 mg total) by mouth 2 (two) times daily. 180 tablet 2   antiseptic oral rinse (BIOTENE) LIQD 15 mLs by Mouth Rinse route 2 (two) times daily as needed for dry mouth.     apixaban (ELIQUIS) 5 MG TABS tablet TAKE 1 TABLET BY MOUTH  TWICE DAILY 180 tablet 1   cholecalciferol (VITAMIN D) 1000 UNITS tablet Take 1,000 Units by mouth daily.     co-enzyme Q-10 30 MG capsule Take 30 mg by mouth daily.     diclofenac sodium (VOLTAREN) 1 % GEL Apply 1 application topically 2 (two) times daily as needed (pain).     diltiazem (CARDIZEM CD) 360 MG 24 hr capsule TAKE 1 CAPSULE BY MOUTH  DAILY 90 capsule 3   docusate sodium (COLACE) 100 MG capsule Take 100 mg by mouth daily.      dofetilide (TIKOSYN) 250 MCG capsule Take 1 capsule (250 mcg total) by mouth 2 (two) times daily. 180 capsule 1   ezetimibe (ZETIA) 10 MG tablet Take 1 tablet (10 mg total) by mouth at bedtime. 90 tablet 3   furosemide (LASIX) 20 MG tablet TAKE 1 TABLET BY MOUTH  DAILY AS NEEDED FOR EDEMA 90 tablet 3   gabapentin (NEURONTIN) 300 MG capsule TAKE 2 CAPSULES BY MOUTH IN THE MORNING , 1 CAPSULE IN  THE AFTERNOON AND 2  CAPSULES AT BEDTIME 450 capsule 3   lovastatin (MEVACOR) 10 MG tablet TAKE 1 TABLET BY MOUTH  EVERY MONDAY, WEDNESDAY,  AND FRIDAY 39 tablet 3   metoprolol tartrate (LOPRESSOR) 50 MG tablet Take 1.5 tablets (75 mg total) by mouth 2 (two) times daily. 270 tablet 3   pantoprazole (PROTONIX) 40 MG tablet Take 40 mg by mouth daily.     Polyethyl Glycol-Propyl Glycol (SYSTANE) 0.4-0.3 % GEL ophthalmic gel Place 1 drop into both eyes 2 (two) times daily.      polyethylene glycol powder (GLYCOLAX/MIRALAX) 17 GM/SCOOP powder Take 8.5-17 g by mouth daily as needed for moderate constipation. (1/2-1 capful daily as needed) 3350 g 1   RESTASIS 0.05 % ophthalmic emulsion INSTILL ONE DROP IN BOTH  EYES TWO TIMES DAILY 180 each 0   thiamine (VITAMIN B-1) 50 MG tablet Take 1 tablet (50 mg total) by mouth daily.     traMADol (ULTRAM) 50 MG tablet Take  1 tablet (50 mg total) by mouth daily as needed. 30 tablet 0   vitamin B-12 (CYANOCOBALAMIN) 1000 MCG tablet Take 1 tablet (1,000 mcg total) by mouth every Monday, Wednesday, and Friday.     losartan (COZAAR) 50 MG tablet Take 1 tablet (50 mg total) by mouth daily. 90 tablet 3   No current facility-administered medications for this visit.     Past Medical History:  Diagnosis Date   Ankle fracture, left 02/19/2015   from a fall   Anxiety    Cataract 2014   corrected with surgery   CHF (congestive heart failure) (HCC)    CKD (chronic kidney disease) stage 3, GFR 30-59 ml/min [facility])    saw nephrologist Dr Harden Mo   DDD (degenerative disc disease),  cervical    Depression    Fibromyalgia    Glaucoma    s/p surgery, sees ophtho Q6 mo   History of DVT (deep vein thrombosis) several times latest 2012   receives coumadin while hospitalized   History of kidney stones 2010   History of pulmonary embolism 2001, 2006   completed coumadin courses   History of rheumatic fever x3   HLD (hyperlipidemia)    HTN (hypertension)    Insomnia    Lung nodule 09/22/2012   RLL - 93mm, stable since 2014. Thought benign.    Osteoarthritis    shoulders and knees, not RA per Dr Estanislado Pandy, positive ANA, positive Ro   Osteopenia 09/18/2015   DEXA T -1.1 hip, -0.2 spine 08/2015    Personal history of urinary calculi latest 2014   Pneumonia 12/02/2011   PONV (postoperative nausea and vomiting)    Refusal of blood transfusions as patient is Jehovah's Witness    Rheumatic heart disease 1980   s/p mitral valve repair 1980   Sjogren's syndrome ([place])    Trimalleolar fracture of left ankle 02/23/2015    Past Surgical History:  Procedure Laterality Date   BREAST BIOPSY Right 2006   benign   CARDIOVERSION N/A 07/18/2020   Procedure: CARDIOVERSION;  Surgeon: Elouise Munroe, MD;  Location: [facility] ENDOSCOPY;  Service: Cardiovascular;  Laterality: N/A;   CHOLECYSTECTOMY  11/27/2011   Procedure: LAPAROSCOPIC CHOLECYSTECTOMY WITH INTRAOPERATIVE CHOLANGIOGRAM;  Surgeon: Adin Hector, MD;  Location: [place];  Service: General;  Laterality: N/A;  laparoscopic cholecystectomy with choleangiogram umbilical hernia repair   COLONOSCOPY  07/2014   WNL [facility])   COLONOSCOPY  08/2019   3 TAs, rpt 3 yrs ([place])   dexa  08/2012   normal per patient - no records available   ESOPHAGOGASTRODUODENOSCOPY  08/2019   reactive gastropathy, neg H pylori ([place])   EYE SURGERY Bilateral 2014   cataract removal   MITRAL VALVE REPAIR  1980   open heart   ORIF ANKLE FRACTURE Left 02/26/2015   Procedure: OPEN REDUCTION INTERNAL FIXATION (ORIF) LEFT TRIMALLEOLAR ANKLE FRACTURE;   Surgeon: [name], MD;  Location: [place];  Service: Orthopedics;  Laterality: Left;   TUBAL LIGATION  3354   UMBILICAL HERNIA REPAIR  11/27/2011   Procedure: HERNIA REPAIR UMBILICAL ADULT;  Surgeon: Adin Hector, MD;  Location: [place];  Service: General;  Laterality: N/A;   VAGINAL HYSTERECTOMY  1992   for fibroids -- partial, ovaries remain    Social History   Socioeconomic History   Marital status: Married    Spouse name: [name]   Number of children: 4   Years of education: 12   Highest education level: Not on file  Occupational  History    Employer: UNEMPLOYED    Comment: Disability  Tobacco Use   Smoking status: Never   Smokeless tobacco: Never  Vaping Use   Vaping Use: Never used  Substance and Sexual Activity   Alcohol use: No    Alcohol/week: 0.0 standard drinks   Drug use: No   Sexual activity: Not Currently    Birth control/protection: Surgical  Other Topics Concern   Not on file  Social History Narrative   Lives with son, 1 dog   Occupation: unemployed, on disability for fibromyalgia since 2008.   Edu: HS   Religion: Jehova's witness   Activity: volunteers at senior center   Diet: some water, fruits/vegetables daily   No caffeine use   Social Determinants of Radio broadcast assistant Strain: Not on file  Food Insecurity: Not on file  Transportation Needs: Not on file  Physical Activity: Not on file  Stress: Not on file  Social Connections: Not on file  Intimate Partner Violence: Not on file    Family History  Problem Relation Age of Onset   Cancer Mother        lung (nonsmoker)   CAD Mother        MI in her 75s   ALS Mother    Kidney disease Father    Alcohol abuse Father    Diabetes Father    Lupus Sister        and niece   Diabetes Sister    Stroke Sister    Depression Sister    Lupus Sister    Blindness Sister    Cancer Maternal Uncle        bone   Stroke Maternal Grandmother    Cancer Brother        bone   Diabetes Brother     Heart attack Brother    Healthy Son    Healthy Son    Healthy Son    Healthy Son    Kidney failure Other        on HD   Breast cancer Neg Hx     ROS: Foot pain but no fevers or chills, productive cough, hemoptysis, dysphasia, odynophagia, melena, hematochezia, dysuria, hematuria, rash, seizure activity, orthopnea, PND, pedal edema, claudication. Remaining systems are negative.  Physical Exam: Well-developed well-nourished in no acute distress.  Skin is warm and dry.  HEENT is normal.  Neck is supple.  Chest is clear to auscultation with normal expansion.  Cardiovascular exam is regular rate and rhythm.  Abdominal exam nontender or distended. No masses palpated. Extremities show no edema. neuro grossly intact  ECG- personally reviewed  A/P  1 atrial flutter-patient is now on Tikosyn and maintaining sinus rhythm.  We will continue.  Continue Cardizem, metoprolol and apixaban.  2 status post mitral valve repair-continue SBE prophylaxis.  3 valvular heart disease-most recent echocardiogram as outlined above.  Continue SBE prophylaxis.  Will need follow-up echocardiogram August 2023.  4 chronic diastolic congestive heart failure-she is euvolemic.  Continue diuretic at present dose.  5 hypertension-blood pressure elevated; however pt has not taken meds this AM; will follow and adjust meds as indicated.  6 hyperlipidemia-continue statin.  7 palpitations-continue beta-blocker.  Kirk Ruths, MD

## 2021-01-17 NOTE — Progress Notes (Signed)
Chronic Care Management Pharmacy Assistant   Name: Alexandria Leach  MRN: [id number] DOB: 16-Oct-1950   Reason for Encounter: CCM (Hypertension Disease State)   Recent office visits:  01/12/2021 - Ria Bush, MD - Patient presented for Annual Wellness Visit. Labs: CMP, A1c, Lipid, Vit B, Folate, Parathyroid Hormone, T4, TSH and Vit B12. No medication changes.   Recent consult visits:  None since last CCM contact  Hospital visits:  None since last CCM contact  Medications: Outpatient Encounter Medications as of 01/17/2021  Medication Sig   ALPRAZolam (XANAX) 0.5 MG tablet Take 1 tablet (0.5 mg total) by mouth 2 (two) times daily.   antiseptic oral rinse (BIOTENE) LIQD 15 mLs by Mouth Rinse route 2 (two) times daily as needed for dry mouth.   apixaban (ELIQUIS) 5 MG TABS tablet TAKE 1 TABLET BY MOUTH  TWICE DAILY   cholecalciferol (VITAMIN D) 1000 UNITS tablet Take 1,000 Units by mouth daily.   co-enzyme Q-10 30 MG capsule Take 30 mg by mouth daily.   diclofenac sodium (VOLTAREN) 1 % GEL Apply 1 application topically 2 (two) times daily as needed (pain).   diltiazem (CARDIZEM CD) 360 MG 24 hr capsule TAKE 1 CAPSULE BY MOUTH  DAILY   docusate sodium (COLACE) 100 MG capsule Take 100 mg by mouth daily.   dofetilide (TIKOSYN) 250 MCG capsule Take 1 capsule (250 mcg total) by mouth 2 (two) times daily.   ezetimibe (ZETIA) 10 MG tablet Take 1 tablet (10 mg total) by mouth at bedtime.   furosemide (LASIX) 20 MG tablet TAKE 1 TABLET BY MOUTH  DAILY AS NEEDED FOR EDEMA   gabapentin (NEURONTIN) 300 MG capsule TAKE 2 CAPSULES BY MOUTH IN THE MORNING , 1 CAPSULE IN  THE AFTERNOON AND 2  CAPSULES AT BEDTIME   losartan (COZAAR) 50 MG tablet Take 1 tablet (50 mg total) by mouth daily.   lovastatin (MEVACOR) 10 MG tablet TAKE 1 TABLET BY MOUTH  EVERY MONDAY, WEDNESDAY,  AND FRIDAY   metoprolol tartrate (LOPRESSOR) 50 MG tablet Take 1.5 tablets (75 mg total) by mouth 2 (two) times daily.    pantoprazole (PROTONIX) 40 MG tablet Take 40 mg by mouth daily.   Polyethyl Glycol-Propyl Glycol (SYSTANE) 0.4-0.3 % GEL ophthalmic gel Place 1 drop into both eyes 2 (two) times daily.    polyethylene glycol powder (GLYCOLAX/MIRALAX) 17 GM/SCOOP powder Take 8.5-17 g by mouth daily as needed for moderate constipation. (1/2-1 capful daily as needed)   RESTASIS 0.05 % ophthalmic emulsion INSTILL ONE DROP IN BOTH  EYES TWO TIMES DAILY   traMADol (ULTRAM) 50 MG tablet Take 1 tablet (50 mg total) by mouth daily as needed.   vitamin B-12 (CYANOCOBALAMIN) 1000 MCG tablet Take 1 tablet (1,000 mcg total) by mouth every Monday, Wednesday, and Friday.   No facility-administered encounter medications on file as of 01/17/2021.    Recent Office Vitals: BP Readings from Last 3 Encounters:  01/12/21 136/72  12/11/20 (!) 150/76  11/27/20 104/84   Pulse Readings from Last 3 Encounters:  01/12/21 62  12/11/20 75  11/27/20 75    Wt Readings from Last 3 Encounters:  01/12/21 199 lb 6 oz (90.4 kg)  12/11/20 201 lb 9.6 oz (91.4 kg)  11/27/20 201 lb 9.6 oz (91.4 kg)     Kidney Function Lab Results  Component Value Date/Time   CREATININE 1.29 (H) 01/12/2021 03:02 PM   CREATININE 1.35 (H) 11/27/2020 11:26 AM   CREATININE 1.32 (H) 11/06/2020 11:15 AM  CREATININE 1.28 (H) 04/20/2019 03:11 PM   GFR 39.48 (L) 07/11/2020 12:29 PM   GFRNONAA 44 (L) 11/06/2020 11:15 AM   GFRNONAA 44 (L) 04/20/2019 03:11 PM   GFRAA 51 (L) 11/11/2019 04:26 PM   GFRAA 50 (L) 04/20/2019 03:11 PM    BMP Latest Ref Rng & Units 01/12/2021 11/27/2020 11/06/2020  Glucose 65 - 99 mg/dL 93 76 98  BUN 7 - 25 mg/dL 13 12 8   Creatinine 0.50 - 1.05 mg/dL 1.29(H) 1.35(H) 1.32(H)  BUN/Creat Ratio 6 - 22 (calc) 10 9(L) -  Sodium 135 - 146 mmol/L 140 140 139  Potassium 3.5 - 5.3 mmol/L 4.0 4.5 4.1  Chloride 98 - 110 mmol/L 107 105 109  CO2 20 - 32 mmol/L 22 23 22   Calcium 8.6 - 10.4 mg/dL 10.2 9.6 9.9   Contacted patient on  11/11/202  to discuss hypertension disease state  Current antihypertensive regimen:   Diltiazem 360 mg 24 hr - 1 capsule daily  Metoprolol tartrate 50 mg - 1 and 1/2 tablets (75 mg) twice daily   Losartan 50 mg - 1 tablet daily   Lasix 20mg  take 1 tablet daily as needed - no longer taking as no more fluid retention since being on Tikosyn 250 mcg  Patient verbally confirms she is taking the above medications as directed. Yes  How often are you checking your Blood Pressure? daily  she checks her blood pressure in the morning after taking her medication and food.  Current home BP readings:   11/10 = 157/87 11/09 = 121/71 11/08 = 139/71 11/07 = 138/72  Wrist or arm cuff: Arm Caffeine intake: Only water Salt intake: Patient states she adds a little salt to her food; trying to watch salt intake OTC medications including pseudoephedrine or NSAIDs? No  Any readings above 180/120? No  What recent interventions/DTPs have been made by any provider to improve Blood Pressure control since last CPP Visit: No recent interventions.   Any recent hospitalizations or ED visits since last visit with CPP? No  What diet changes have been made to improve Blood Pressure Control?  Patient is trying to watch her salt intake and drink water.  What exercise is being done to improve your Blood Pressure Control?  Patient will walk in the house or driveway, it is rough walking on the road where she lives.   Adherence Review: Is the patient currently on ACE/ARB medication? Yes Does the patient have >5 day gap between last estimated fill dates? No  Star Rating Drugs:  Medication:  Last Fill: Day Supply Losartan 50mg             11/18/2020      90  Lovastatin 10mg          11/07/2020      90 Verified fill dates with [facility]  Care Gaps: Annual wellness visit in last year? Yes Most Recent BP reading: 136/72 on 01/12/2021  Cardiovascular appointment on 01/26/2021 PCP appointment on  02/12/2021  Alexandria Leach, CPP notified  Alexandria Leach, [place] [phone]   Time Spent: 30 Minutes

## 2021-01-18 ENCOUNTER — Other Ambulatory Visit: Payer: Self-pay | Admitting: Family Medicine

## 2021-01-18 ENCOUNTER — Encounter: Payer: Self-pay | Admitting: Family Medicine

## 2021-01-18 DIAGNOSIS — E519 Thiamine deficiency, unspecified: Secondary | ICD-10-CM | POA: Insufficient documentation

## 2021-01-18 MED ORDER — VITAMIN B-1 50 MG PO TABS: 50.0000 mg | ORAL_TABLET | Freq: Every day | ORAL | Status: DC

## 2021-01-23 ENCOUNTER — Telehealth: Payer: Self-pay | Admitting: *Deleted

## 2021-01-23 NOTE — Telephone Encounter (Signed)
Received a potential drug to drug interaction from [facility].   Possible interaction between: Xanax and Tramadol.  Possible Risks: Overdose risk  Reviewed by: Hazel Sams, PA-C  Recommendations: Patient takes Tramadol sparingly. Patient advised to avoid concurrent use of Tramadol and Xanax due to overdose risk.   Patient expressed understanding.

## 2021-01-25 ENCOUNTER — Ambulatory Visit
Admission: EM | Admit: 2021-01-25 | Discharge: 2021-01-25 | Disposition: A | Discharge status: Home / Self Care | Payer: Medicare Other | Attending: Family Medicine | Admitting: Family Medicine

## 2021-01-25 DIAGNOSIS — M25571 Pain in right ankle and joints of right foot: Secondary | ICD-10-CM

## 2021-01-25 NOTE — ED Provider Notes (Signed)
[place]   [id number] 01/25/21 Arrival Time: 7026  ASSESSMENT & PLAN:  1. Acute right ankle pain    No injury/trauma. Ques mild strain/sprain. ASO for one week. WBAT. Will f/u with PCP if not improving. Tylenol prn. Reports cannot take NSAIDs.  Reviewed expectations re: course of current medical issues. Questions answered. Outlined signs and symptoms indicating need for more acute intervention. Patient verbalized understanding. After Visit Summary given.  SUBJECTIVE: History from: patient. Alexandria Leach is a 70 y.o. female who reports intermittent R ankle/foot pain; on/off "swelling" over past month; pain started few d ago. No trauma. No skin changes/wounds. No tx PTA. Able to bear wt.  Past Surgical History:  Procedure Laterality Date   BREAST BIOPSY Right 2006   benign   CARDIOVERSION N/A 07/18/2020   Procedure: CARDIOVERSION;  Surgeon: Elouise Munroe, MD;  Location: [facility] ENDOSCOPY;  Service: Cardiovascular;  Laterality: N/A;   CHOLECYSTECTOMY  11/27/2011   Procedure: LAPAROSCOPIC CHOLECYSTECTOMY WITH INTRAOPERATIVE CHOLANGIOGRAM;  Surgeon: Adin Hector, MD;  Location: [place];  Service: General;  Laterality: N/A;  laparoscopic cholecystectomy with choleangiogram umbilical hernia repair   COLONOSCOPY  07/2014   WNL Amedeo Plenty)   COLONOSCOPY  08/2019   3 TAs, rpt 3 yrs ([place])   dexa  08/2012   normal per patient - no records available   ESOPHAGOGASTRODUODENOSCOPY  08/2019   reactive gastropathy, neg H pylori ([place])   EYE SURGERY Bilateral 2014   cataract removal   MITRAL VALVE REPAIR  1980   open heart   ORIF ANKLE FRACTURE Left 02/26/2015   Procedure: OPEN REDUCTION INTERNAL FIXATION (ORIF) LEFT TRIMALLEOLAR ANKLE FRACTURE;  Surgeon: [name], MD;  Location: [place];  Service: Orthopedics;  Laterality: Left;   TUBAL LIGATION  3785   UMBILICAL HERNIA REPAIR  11/27/2011   Procedure: HERNIA REPAIR UMBILICAL ADULT;  Surgeon: Adin Hector, MD;  Location: [place] OR;  Service: General;  Laterality: N/A;   VAGINAL HYSTERECTOMY  1992   for fibroids -- partial, ovaries remain      OBJECTIVE:  Vitals:   01/25/21 1034  BP: (!) 157/56  Pulse: 77  Resp: 16  Temp: 98.5 F (36.9 C)  SpO2: 99%    General appearance: alert; no distress HEENT: [place]; AT Neck: supple with FROM Resp: unlabored respirations Extremities: RLE: warm with well perfused appearance; poorly localized mild tenderness over right lateral ankle over ATFL distribution; without gross deformities; swelling: none; bruising: none; ankle ROM: normal Skin: warm and dry; no visible rashes Neurologic: gait normal Psychological: alert and cooperative; normal mood and affect   Allergies  Allergen Reactions   Cymbalta [Duloxetine Hcl] Other (See Comments)    tachycardia   Statins Nausea Only and Other (See Comments)    Muscle cramps also   Sulfa Antibiotics Nausea And Vomiting    Past Medical History:  Diagnosis Date   Ankle fracture, left 02/19/2015   from a fall   Anxiety    Cataract 2014   corrected with surgery   CHF (congestive heart failure) (HCC)    CKD (chronic kidney disease) stage 3, GFR 30-59 ml/min (HCC)    saw nephrologist Dr Harden Mo   DDD (degenerative disc disease), cervical    Depression    Fibromyalgia    Glaucoma    s/p surgery, sees ophtho Q6 mo   History of DVT (deep vein thrombosis) several times latest 2012   receives coumadin while hospitalized   History of kidney stones 2010  History of pulmonary embolism 2001, 2006   completed coumadin courses   History of rheumatic fever x3   HLD (hyperlipidemia)    HTN (hypertension)    Insomnia    Lung nodule 09/22/2012   RLL - 67mm, stable since 2014. Thought benign.    Osteoarthritis    shoulders and knees, not RA per Dr Estanislado Pandy, positive ANA, positive Ro   Osteopenia 09/18/2015   DEXA T -1.1 hip, -0.2 spine 08/2015    Personal history of urinary calculi latest 2014   Pneumonia  12/02/2011   PONV (postoperative nausea and vomiting)    Refusal of blood transfusions as patient is Jehovah's Witness    Rheumatic heart disease 1980   s/p mitral valve repair 1980   Sjogren's syndrome ([place])    Trimalleolar fracture of left ankle 02/23/2015   Social History   Socioeconomic History   Marital status: Married    Spouse name: [name]   Number of children: 4   Years of education: 12   Highest education level: Not on file  Occupational History    Employer: UNEMPLOYED    Comment: Disability  Tobacco Use   Smoking status: Never   Smokeless tobacco: Never  Vaping Use   Vaping Use: Never used  Substance and Sexual Activity   Alcohol use: No    Alcohol/week: 0.0 standard drinks   Drug use: No   Sexual activity: Not Currently    Birth control/protection: Surgical  Other Topics Concern   Not on file  Social History Narrative   Lives with son, 1 dog   Occupation: unemployed, on disability for fibromyalgia since 2008.   Edu: HS   Religion: Jehova's witness   Activity: volunteers at senior center   Diet: some water, fruits/vegetables daily   No caffeine use   Social Determinants of Health   Financial Resource Strain: Not on file  Food Insecurity: Not on file  Transportation Needs: Not on file  Physical Activity: Not on file  Stress: Not on file  Social Connections: Not on file   Family History  Problem Relation Age of Onset   Cancer Mother        lung (nonsmoker)   CAD Mother        MI in her 65s   ALS Mother    Kidney disease Father    Alcohol abuse Father    Diabetes Father    Lupus Sister        and niece   Diabetes Sister    Stroke Sister    Depression Sister    Lupus Sister    Blindness Sister    Cancer Maternal Uncle        bone   Stroke Maternal Grandmother    Cancer Brother        bone   Diabetes Brother    Heart attack Brother    Healthy Son    Healthy Son    Healthy Son    Healthy Son    Kidney failure Other        on HD    Breast cancer Neg Hx    Past Surgical History:  Procedure Laterality Date   BREAST BIOPSY Right 2006   benign   CARDIOVERSION N/A 07/18/2020   Procedure: CARDIOVERSION;  Surgeon: Elouise Munroe, MD;  Location: [facility] ENDOSCOPY;  Service: Cardiovascular;  Laterality: N/A;   CHOLECYSTECTOMY  11/27/2011   Procedure: LAPAROSCOPIC CHOLECYSTECTOMY WITH INTRAOPERATIVE CHOLANGIOGRAM;  Surgeon: Adin Hector, MD;  Location: [place];  Service:  General;  Laterality: N/A;  laparoscopic cholecystectomy with choleangiogram umbilical hernia repair   COLONOSCOPY  07/2014   WNL Amedeo Plenty)   COLONOSCOPY  08/2019   3 TAs, rpt 3 yrs ([place])   dexa  08/2012   normal per patient - no records available   ESOPHAGOGASTRODUODENOSCOPY  08/2019   reactive gastropathy, neg H pylori ([place])   EYE SURGERY Bilateral 2014   cataract removal   MITRAL VALVE REPAIR  1980   open heart   ORIF ANKLE FRACTURE Left 02/26/2015   Procedure: OPEN REDUCTION INTERNAL FIXATION (ORIF) LEFT TRIMALLEOLAR ANKLE FRACTURE;  Surgeon: [name], MD;  Location: [place];  Service: Orthopedics;  Laterality: Left;   TUBAL LIGATION  1962   UMBILICAL HERNIA REPAIR  11/27/2011   Procedure: HERNIA REPAIR UMBILICAL ADULT;  Surgeon: Adin Hector, MD;  Location: [place];  Service: General;  Laterality: N/A;   [place]   for fibroids -- partial, ovaries remain       Vanessa Kick, MD 01/25/21 1140

## 2021-01-25 NOTE — ED Triage Notes (Signed)
Patient states she is having issues with her right foot, hurting when she walks and swelling for the past month. She states she does have neuropathy and that may be the problem. Pain with ROM. Not sure if she twisted it.   Denies Medication

## 2021-01-26 ENCOUNTER — Encounter: Payer: Self-pay | Admitting: Cardiology

## 2021-01-26 ENCOUNTER — Ambulatory Visit (INDEPENDENT_AMBULATORY_CARE_PROVIDER_SITE_OTHER): Payer: Medicare Other | Admitting: Cardiology

## 2021-01-26 ENCOUNTER — Other Ambulatory Visit: Payer: Self-pay

## 2021-01-26 VITALS — BP 150/70 | HR 70 | Ht 61.0 in | Wt 202.6 lb

## 2021-01-26 DIAGNOSIS — I484 Atypical atrial flutter: Secondary | ICD-10-CM | POA: Diagnosis not present

## 2021-01-26 DIAGNOSIS — I5032 Chronic diastolic (congestive) heart failure: Secondary | ICD-10-CM | POA: Diagnosis not present

## 2021-01-26 DIAGNOSIS — Z9889 Other specified postprocedural states: Secondary | ICD-10-CM | POA: Diagnosis not present

## 2021-01-26 DIAGNOSIS — I10 Essential (primary) hypertension: Secondary | ICD-10-CM | POA: Diagnosis not present

## 2021-01-26 DIAGNOSIS — I359 Nonrheumatic aortic valve disorder, unspecified: Secondary | ICD-10-CM

## 2021-01-26 NOTE — Patient Instructions (Signed)

## 2021-01-30 ENCOUNTER — Encounter: Payer: Self-pay | Admitting: Podiatry

## 2021-01-30 ENCOUNTER — Ambulatory Visit (INDEPENDENT_AMBULATORY_CARE_PROVIDER_SITE_OTHER): Payer: Medicare Other

## 2021-01-30 ENCOUNTER — Ambulatory Visit (INDEPENDENT_AMBULATORY_CARE_PROVIDER_SITE_OTHER): Payer: Medicare Other | Admitting: Podiatry

## 2021-01-30 ENCOUNTER — Other Ambulatory Visit: Payer: Self-pay | Admitting: Podiatry

## 2021-01-30 ENCOUNTER — Other Ambulatory Visit: Payer: Self-pay

## 2021-01-30 DIAGNOSIS — M7671 Peroneal tendinitis, right leg: Secondary | ICD-10-CM | POA: Diagnosis not present

## 2021-01-30 DIAGNOSIS — Z86010 Personal history of colonic polyps: Secondary | ICD-10-CM | POA: Insufficient documentation

## 2021-01-30 DIAGNOSIS — R109 Unspecified abdominal pain: Secondary | ICD-10-CM | POA: Insufficient documentation

## 2021-01-30 DIAGNOSIS — M7672 Peroneal tendinitis, left leg: Secondary | ICD-10-CM | POA: Diagnosis not present

## 2021-01-30 DIAGNOSIS — K219 Gastro-esophageal reflux disease without esophagitis: Secondary | ICD-10-CM | POA: Insufficient documentation

## 2021-01-30 DIAGNOSIS — M778 Other enthesopathies, not elsewhere classified: Secondary | ICD-10-CM

## 2021-01-30 MED ORDER — METHYLPREDNISOLONE 4 MG PO TBPK: ORAL_TABLET | ORAL | 0 refills | Status: DC

## 2021-01-30 NOTE — Progress Notes (Signed)
  Subjective:  Patient ID: Alexandria Leach, female    DOB: Sep 29, 1950,   MRN: [id number]  Chief Complaint  Patient presents with   Foot Pain    I have some swelling and the top of the right foot hurts and the pain goes around the ankle and hurts in the crease of the ankle     70 y.o. female presents for right foot swelling and severe pain that has been present for about a month. Relates the swelling has been on and off as well as the pain.  Denies any injury was seen in the ED on 01/25/21 and given a brae and advised to take tylenol. Patient has a history of Sjogrens, fibromyalgia, and Diabetes. Last A1c was 6 on 01/12/21 . Denies any other pedal complaints. Denies n/v/f/c.   PCP: Ria Bush MD   Past Medical History:  Diagnosis Date   Ankle fracture, left 02/19/2015   from a fall   Anxiety    Cataract 2014   corrected with surgery   CHF (congestive heart failure) (HCC)    CKD (chronic kidney disease) stage 3, GFR 30-59 ml/min [facility])    saw nephrologist Dr Harden Mo   DDD (degenerative disc disease), cervical    Depression    Fibromyalgia    Glaucoma    s/p surgery, sees ophtho Q6 mo   History of DVT (deep vein thrombosis) several times latest 2012   receives coumadin while hospitalized   History of kidney stones 2010   History of pulmonary embolism 2001, 2006   completed coumadin courses   History of rheumatic fever x3   HLD (hyperlipidemia)    HTN (hypertension)    Insomnia    Lung nodule 09/22/2012   RLL - 50mm, stable since 2014. Thought benign.    Osteoarthritis    shoulders and knees, not RA per Dr Estanislado Pandy, positive ANA, positive Ro   Osteopenia 09/18/2015   DEXA T -1.1 hip, -0.2 spine 08/2015    Personal history of urinary calculi latest 2014   Pneumonia 12/02/2011   PONV (postoperative nausea and vomiting)    Refusal of blood transfusions as patient is Jehovah's Witness    Rheumatic heart disease 1980   s/p mitral valve repair 1980   Sjogren's syndrome ([place])     Trimalleolar fracture of left ankle 02/23/2015    Objective:  Physical Exam: Vascular: DP/PT pulses 2/4 bilateral. CFT <3 seconds. Normal hair growth on digits. No edema.  Skin. No lacerations or abrasions bilateral feet.  Musculoskeletal: MMT 5/5 bilateral lower extremities in DF, PF, Inversion and Eversion. Deceased ROM in DF of ankle joint.  Tender to right peroneal tendon posterior to lateral malleolus. No tenderness over dorsal foot or anterior ankle joint line. No tenderness over the ATFL. No pain with DF, PF, inversion. Pain with eversion.  Neurological: Sensation intact to light touch.   Assessment:   1. Peroneal tendonitis, left      Plan:  Patient was evaluated and treated and all questions answered. X-rays reviewed and discussed with patient. No acute fractures or dislocations noted. Mild decreased of joint space of first MTPJ.  Discussed peroneal tendinitis and treatment options at length with patient Discussed stretching exercises and provided handout. Prescription for medrol dose pack provided Continue with brace  Discussed that if the symptoms do not improve can consider PT/MRI. Patient to return in 6 to 8 weeks or sooner if symptoms fail to improve or worsen.    Lorenda Peck, DPM

## 2021-01-30 NOTE — Patient Instructions (Signed)
Peroneal Tendinopathy Rehab Ask your health care provider which exercises are safe for you. Do exercises exactly as told by your health care provider and adjust them as directed. It is normal to feel mild stretching, pulling, tightness, or discomfort as you do these exercises. Stop right away if you feel sudden pain or your pain gets worse. Do not begin these exercises until told by your health care provider. Stretching and range-of-motion exercises These exercises warm up your muscles and joints and improve the movement and flexibility of your ankle. These exercises also help to relieve pain and stiffness. Gastroc and soleus stretch, standing This is an exercise in which you stand on a step and use your body weight to stretch your calf muscles. To do this exercise: Stand on the edge of a step on the ball of your left / right foot. The ball of your foot is on the walking surface, right under your toes. Keep your other foot firmly on the same step. Hold on to the wall, a railing, or a chair for balance. Slowly lift your other foot, allowing your body weight to press your left / right heel down over the edge of the step. You should feel a stretch in your left / right calf (gastrocnemius and soleus). Hold this position for __________ seconds. Return both feet to the step. Repeat this exercise with a slight bend in your left / right knee. Repeat __________ times with your left / right knee straight and __________ times with your left / right knee bent. Complete this exercise __________ times a day. Strengthening exercises These exercises build strength and endurance in your foot and ankle. Endurance is the ability to use your muscles for a long time, even after they get tired. Ankle dorsiflexion with band  Secure a rubber exercise band or tube to an object, such as a table leg, that will not move when the band is pulled. Secure the other end of the band around your left / right foot. Sit on the  floor, facing the object with your left / right leg extended. The band or tube should be slightly tense when your foot is relaxed. Slowly flex your left / right ankle and toes to bring your foot toward you (dorsiflexion). Hold this position for __________ seconds. Let the band or tube slowly pull your foot back to the starting position. Repeat __________ times. Complete this exercise __________ times a day. Ankle eversion Sit on the floor with your legs straight out in front of you. Loop a rubber exercise band or tube around the ball of your left / right foot. The ball of your foot is on the walking surface, right under your toes. Hold the ends of the band in your hands, or secure the band to a stable object. The band or tube should be slightly tense when your foot is relaxed. Slowly push your foot outward, away from your other leg (eversion). Hold this position for __________ seconds. Slowly return your foot to the starting position. Repeat __________ times. Complete this exercise __________ times a day. Plantar flexion, standing This exercise is sometimes called standing heel raise. Stand with your feet shoulder-width apart. Place your hands on a wall or table to steady yourself as needed, but try not to use it for support. Keep your weight spread evenly over the width of your feet while you slowly rise up on your toes (plantar flexion). If told by your health care provider: Shift your weight toward your left / right   leg until you feel challenged. Stand on your left / right leg only. Hold this position for __________ seconds. Repeat __________ times. Complete this exercise __________ times a day. Single leg stand Without shoes, stand near a railing or in a doorway. You may hold on to the railing or door frame as needed. Stand on your left / right foot. Keep your big toe down on the floor and try to keep your arch lifted. Do not roll to the outside of your foot. If this exercise is too  easy, you can try it with your eyes closed or while standing on a pillow. Hold this position for __________ seconds. Repeat __________ times. Complete this exercise __________ times a day. This information is not intended to replace advice given to you by your health care provider. Make sure you discuss any questions you have with your health care provider. Document Revised: 06/23/2018 Document Reviewed: 06/23/2018 Elsevier Patient Education  [place].

## 2021-02-01 ENCOUNTER — Other Ambulatory Visit: Payer: Self-pay | Admitting: Family Medicine

## 2021-02-11 NOTE — Progress Notes (Signed)
Subjective:   Alexandria Leach is a 70 y.o. female who presents for Medicare Annual (Subsequent) preventive examination.  I connected with Aries Kasa today by telephone and verified that I am speaking with the correct person using two identifiers. Location patient: home Location provider: work Persons participating in the virtual visit: patient, Marine scientist.    I discussed the limitations, risks, security and privacy concerns of performing an evaluation and management service by telephone and the availability of in person appointments. I also discussed with the patient that there may be a patient responsible charge related to this service. The patient expressed understanding and verbally consented to this telephonic visit.    Interactive audio and video telecommunications were attempted between this provider and patient, however failed, due to patient having technical difficulties OR patient did not have access to video capability.  We continued and completed visit with audio only.  Some vital signs may be absent or patient reported.   Time Spent with patient on telephone encounter: 25 minutes  Review of Systems     Cardiac Risk Factors include: advanced age (>54men, >66 women);dyslipidemia;hypertension     Objective:    Today's Vitals   02/12/21 1443  Weight: 198 lb (89.8 kg)  Height: 5\' 1"  (1.549 m)  PainSc: 5    Body mass index is 37.41 kg/m.  Advanced Directives 02/12/2021 10/23/2020 07/18/2020 11/11/2019 12/21/2018 12/11/2017 12/09/2016  Does Patient Have a Medical Advance Directive? Yes No Yes No Yes Yes Yes  Type of Paramedic of [place];Living will - [place];Living will [place];Living will [place];Living will  Does patient want to make changes to medical advance directive? Yes (MAU/Ambulatory/Procedural Areas - Information given) - - - - - -  Copy of  [place] in Chart? Yes - validated most recent copy scanned in chart (See row information) - Yes - validated most recent copy scanned in chart (See row information) - Yes - validated most recent copy scanned in chart (See row information) Yes Yes  Would patient like information on creating a medical advance directive? - No - Patient declined - - - - -  Pre-existing out of facility DNR order (yellow form or pink MOST form) - - - - - - -    Current Medications (verified) Outpatient Encounter Medications as of 02/12/2021  Medication Sig   ALPRAZolam (XANAX) 0.5 MG tablet Take 1 tablet (0.5 mg total) by mouth 2 (two) times daily.   antiseptic oral rinse (BIOTENE) LIQD 15 mLs by Mouth Rinse route 2 (two) times daily as needed for dry mouth.   apixaban (ELIQUIS) 5 MG TABS tablet TAKE 1 TABLET BY MOUTH  TWICE DAILY   cholecalciferol (VITAMIN D) 1000 UNITS tablet Take 1,000 Units by mouth daily.   co-enzyme Q-10 30 MG capsule Take 30 mg by mouth daily.   diclofenac sodium (VOLTAREN) 1 % GEL Apply 1 application topically 2 (two) times daily as needed (pain).   diltiazem (CARDIZEM CD) 360 MG 24 hr capsule TAKE 1 CAPSULE BY MOUTH  DAILY   docusate sodium (COLACE) 100 MG capsule Take 100 mg by mouth daily.   dofetilide (TIKOSYN) 250 MCG capsule Take 1 capsule (250 mcg total) by mouth 2 (two) times daily.   ezetimibe (ZETIA) 10 MG tablet TAKE 1 TABLET BY MOUTH AT  BEDTIME   furosemide (LASIX) 20 MG tablet TAKE 1 TABLET BY MOUTH  DAILY AS NEEDED FOR  EDEMA   gabapentin (NEURONTIN) 300 MG capsule TAKE 2 CAPSULES BY MOUTH IN THE MORNING , 1 CAPSULE IN  THE AFTERNOON AND 2  CAPSULES AT BEDTIME   lovastatin (MEVACOR) 10 MG tablet TAKE 1 TABLET BY MOUTH  EVERY MONDAY, WEDNESDAY,  AND FRIDAY   methylPREDNISolone (MEDROL DOSEPAK) 4 MG TBPK tablet Take as directed   metoprolol tartrate (LOPRESSOR) 50 MG tablet Take 1.5 tablets (75 mg total) by mouth 2 (two) times daily.   pantoprazole (PROTONIX)  40 MG tablet Take 40 mg by mouth daily.   pantoprazole (PROTONIX) 40 MG tablet Take 1 tablet by mouth daily.   Polyethyl Glycol-Propyl Glycol (SYSTANE) 0.4-0.3 % GEL ophthalmic gel Place 1 drop into both eyes 2 (two) times daily.    polyethylene glycol powder (GLYCOLAX/MIRALAX) 17 GM/SCOOP powder Take 8.5-17 g by mouth daily as needed for moderate constipation. (1/2-1 capful daily as needed)   RESTASIS 0.05 % ophthalmic emulsion INSTILL ONE DROP IN BOTH  EYES TWO TIMES DAILY   thiamine (VITAMIN B-1) 50 MG tablet Take 1 tablet (50 mg total) by mouth daily.   traMADol (ULTRAM) 50 MG tablet Take 1 tablet (50 mg total) by mouth daily as needed.   vitamin B-12 (CYANOCOBALAMIN) 1000 MCG tablet Take 1 tablet (1,000 mcg total) by mouth every Monday, Wednesday, and Friday.   losartan (COZAAR) 50 MG tablet Take 1 tablet (50 mg total) by mouth daily.   No facility-administered encounter medications on file as of 02/12/2021.    Allergies (verified) Cymbalta [duloxetine hcl], Statins, and Sulfa antibiotics   History: Past Medical History:  Diagnosis Date   Ankle fracture, left 02/19/2015   from a fall   Anxiety    Cataract 2014   corrected with surgery   CHF (congestive heart failure) (HCC)    CKD (chronic kidney disease) stage 3, GFR 30-59 ml/min (HCC)    saw nephrologist Dr Harden Mo   DDD (degenerative disc disease), cervical    Depression    Fibromyalgia    Glaucoma    s/p surgery, sees ophtho Q6 mo   History of DVT (deep vein thrombosis) several times latest 2012   receives coumadin while hospitalized   History of kidney stones 2010   History of pulmonary embolism 2001, 2006   completed coumadin courses   History of rheumatic fever x3   HLD (hyperlipidemia)    HTN (hypertension)    Insomnia    Lung nodule 09/22/2012   RLL - 66mm, stable since 2014. Thought benign.    Osteoarthritis    shoulders and knees, not RA per Dr Estanislado Pandy, positive ANA, positive Ro   Osteopenia 09/18/2015   DEXA  T -1.1 hip, -0.2 spine 08/2015    Personal history of urinary calculi latest 2014   Pneumonia 12/02/2011   PONV (postoperative nausea and vomiting)    Refusal of blood transfusions as patient is Jehovah's Witness    Rheumatic heart disease 1980   s/p mitral valve repair 1980   Sjogren's syndrome ([place])    Trimalleolar fracture of left ankle 02/23/2015   Past Surgical History:  Procedure Laterality Date   BREAST BIOPSY Right 2006   benign   CARDIOVERSION N/A 07/18/2020   Procedure: CARDIOVERSION;  Surgeon: Elouise Munroe, MD;  Location: [facility];  Service: Cardiovascular;  Laterality: N/A;   CHOLECYSTECTOMY  11/27/2011   Procedure: LAPAROSCOPIC CHOLECYSTECTOMY WITH INTRAOPERATIVE CHOLANGIOGRAM;  Surgeon: Adin Hector, MD;  Location: [facility];  Service: General;  Laterality: N/A;  laparoscopic cholecystectomy with choleangiogram umbilical  hernia repair   COLONOSCOPY  07/2014   WNL Amedeo Plenty)   COLONOSCOPY  08/2019   3 TAs, rpt 3 yrs ([place])   dexa  08/2012   normal per patient - no records available   ESOPHAGOGASTRODUODENOSCOPY  08/2019   reactive gastropathy, neg H pylori ([place])   EYE SURGERY Bilateral 2014   cataract removal   MITRAL VALVE REPAIR  1980   open heart   ORIF ANKLE FRACTURE Left 02/26/2015   Procedure: OPEN REDUCTION INTERNAL FIXATION (ORIF) LEFT TRIMALLEOLAR ANKLE FRACTURE;  Surgeon: [name], MD;  Location: [facility];  Service: Orthopedics;  Laterality: Left;   TUBAL LIGATION  4742   UMBILICAL HERNIA REPAIR  11/27/2011   Procedure: HERNIA REPAIR UMBILICAL ADULT;  Surgeon: Adin Hector, MD;  Location: [facility];  Service: General;  Laterality: N/A;   [place]   for fibroids -- partial, ovaries remain   Family History  Problem Relation Age of Onset   Cancer Mother        lung (nonsmoker)   CAD Mother        MI in her 56s   ALS Mother    Kidney disease Father    Alcohol abuse Father    Diabetes Father    Lupus Sister        and  niece   Diabetes Sister    Stroke Sister    Depression Sister    Lupus Sister    Blindness Sister    Cancer Maternal Uncle        bone   Stroke Maternal Grandmother    Cancer Brother        bone   Diabetes Brother    Heart attack Brother    Healthy Son    Healthy Son    Healthy Son    Healthy Son    Kidney failure Other        on HD   Breast cancer Neg Hx    Social History   Socioeconomic History   Marital status: Married    Spouse name: [name]   Number of children: 4   Years of education: 12   Highest education level: Not on file  Occupational History    Employer: UNEMPLOYED    Comment: Disability  Tobacco Use   Smoking status: Never   Smokeless tobacco: Never  Vaping Use   Vaping Use: Never used  Substance and Sexual Activity   Alcohol use: No    Alcohol/week: 0.0 standard drinks   Drug use: No   Sexual activity: Not Currently    Birth control/protection: Surgical  Other Topics Concern   Not on file  Social History Narrative   Lives with son, 1 dog   Occupation: unemployed, on disability for fibromyalgia since 2008.   Edu: HS   Religion: Jehova's witness   Activity: volunteers at senior center   Diet: some water, fruits/vegetables daily   No caffeine use   Social Determinants of Health   Financial Resource Strain: Low Risk    Difficulty of Paying Living Expenses: Not hard at all  Food Insecurity: No Food Insecurity   Worried About Charity fundraiser in the Last Year: Never true   [place] in the Last Year: Never true  Transportation Needs: No Transportation Needs   Lack of Transportation (Medical): No   Lack of Transportation (Non-Medical): No  Physical Activity: Sufficiently Active   Days of Exercise per Week: 7 days   Minutes of  Exercise per Session: 50 min  Stress: No Stress Concern Present   Feeling of Stress : Only a little  Social Connections: Moderately Integrated   Frequency of Communication with Friends and Family: More than  three times a week   Frequency of Social Gatherings with Friends and Family: Never   Attends Religious Services: More than 4 times per year   Active Member of [employer] or Organizations: No   Attends Music therapist: Never   Marital Status: Married    Tobacco Counseling Counseling given: Not Answered   Clinical Intake:  Pre-visit preparation completed: Yes  Pain : 0-10 Pain Score: 5  Pain Location: Shoulder Pain Orientation: Right     BMI - recorded: 37.41 Nutritional Status: BMI > 30  Obese Nutritional Risks: None Diabetes: No  How often do you need to have someone help you when you read instructions, pamphlets, or other written materials from your doctor or pharmacy?: 1 - Never  Diabetic?No  Interpreter Needed?: No  Information entered by :: Orrin Brigham LPN   Activities of Daily Living In your present state of health, do you have any difficulty performing the following activities: 02/12/2021 10/23/2020  Hearing? N N  Vision? N N  Difficulty concentrating or making decisions? N N  Walking or climbing stairs? Y N  Dressing or bathing? N N  Doing errands, shopping? Y N  Preparing Food and eating ? N -  Using the Toilet? N -  In the past six months, have you accidently leaked urine? N -  Do you have problems with loss of bowel control? N -  Managing your Medications? N -  Managing your Finances? N -  Housekeeping or managing your Housekeeping? Valentine husband does house work -  Some recent data might be hidden    Patient Care Team: Ria Bush, MD as PCP - General (Family Medicine) Stanford Breed [name], MD as PCP - Cardiology (Cardiology) Evans Lance, MD as PCP - Electrophysiology (Cardiology) Stanford Breed [name], MD as Consulting Physician (Cardiology) Bo Merino, MD as Consulting Physician (Rheumatology) Cloria Spring, MD as Consulting Physician ([place]) Sharyne Peach, MD as Consulting Physician  (Ophthalmology) Penni Bombard, MD as Consulting Physician (Neurology) Debbora Dus, [facility] as Pharmacist (Pharmacist)  Indicate any recent Medical Services you may have received from other than [facility] providers in the past year (date may be approximate).     Assessment:   This is a routine wellness examination for [place].  Hearing/Vision screen Hearing Screening - Comments:: No issues Vision Screening - Comments:: Last exam 2021, plans to schedule an appointment, Dr. Delman Cheadle, wears readers  Dietary issues and exercise activities discussed: Current Exercise Habits: Home exercise routine, Type of exercise: walking, Time (Minutes): 45, Frequency (Times/Week): 7, Weekly Exercise (Minutes/Week): 315, Intensity: Moderate   Goals Addressed             This Visit's Progress    Patient Stated       Would like to exercise more.       Depression Screen PHQ 2/9 Scores 12/28/2019 12/22/2018 12/21/2018 12/11/2017 12/09/2016 11/30/2015 10/18/2014  PHQ - 2 Score 0 0 1 0 2 4 3   PHQ- 9 Score 1 4 1  0 4 13 9   Some encounter information is confidential and restricted. Go to Review Flowsheets activity to see all data.    Fall Risk Fall Risk  02/12/2021 01/12/2021 12/28/2019 12/21/2018 12/11/2017  Falls in the past year? 0 0 0 1 Yes  Comment - - - -  lost balance and fell in August 2019  Number falls in past yr: 0 - - 1 1  Comment - - - - -  Injury with Fall? 0 - - 0 Yes  Comment - - - - laceration to forehead; treatment in urgent care  Risk Factor Category  - - - - -  Comment - - - - -  Risk for fall due to : No Fall Risks - - History of fall(s);Medication side effect;Impaired balance/gait;Impaired mobility -  Follow up Falls prevention discussed - - Falls evaluation completed;Falls prevention discussed -    FALL RISK PREVENTION PERTAINING TO THE HOME:  Any stairs in or around the home? Yes  If so, are there any without handrails? Yes  Home free of loose throw rugs in walkways, pet beds,  electrical cords, etc? Yes  Adequate lighting in your home to reduce risk of falls? Yes   ASSISTIVE DEVICES UTILIZED TO PREVENT FALLS:  Life alert? No  Use of a cane, walker or w/c? Yes  , cane Grab bars in the bathroom? No  Shower chair or bench in shower? Yes  Elevated toilet seat or a handicapped toilet? Yes   TIMED UP AND GO:  Was the test performed? No , visit completed over the phone.   Cognitive Function: Normal cognitive status assessed by this Nurse Health Advisor. No abnormalities found.   MMSE - Mini Mental State Exam 12/21/2018 12/11/2017 12/09/2016 11/30/2015  Orientation to time 4 5 5 5   Orientation to Place 5 5 5 5   Registration 3 3 3 3   Attention/ Calculation 5 0 0 0  Recall 2 3 2 1   Recall-comments - - unable to recall 1 of 3 words pt was unable to recall 2 of 3 words  Language- name 2 objects - 0 0 0  Language- repeat 1 1 1 1   Language- follow 3 step command - 3 3 3   Language- read & follow direction - 0 0 0  Write a sentence - 0 0 0  Copy design - 0 0 0  Total score - 20 19 18         Immunizations Immunization History  Administered Date(s) Administered   PFIZER(Purple Top)SARS-COV-2 Vaccination 05/12/2019, 06/09/2019, 01/06/2020   Pneumococcal Conjugate-13 12/15/2017   Pneumococcal Polysaccharide-23 09/15/2012, 12/22/2018    TDAP status: Due, Education has been provided regarding the importance of this vaccine. Advised may receive this vaccine at local pharmacy or Health Dept. Aware to provide a copy of the vaccination record if obtained from local pharmacy or Health Dept. Verbalized acceptance and understanding.  Flu Vaccine status: Declined, Education has been provided regarding the importance of this vaccine but patient still declined. Advised may receive this vaccine at local pharmacy or Health Dept. Aware to provide a copy of the vaccination record if obtained from local pharmacy or Health Dept. Verbalized acceptance and  understanding.  Pneumococcal vaccine status: Up to date  Covid-19 vaccine status: Information provided on how to obtain vaccines.   Qualifies for Shingles Vaccine? Yes   Zostavax completed No   Shingrix Completed?: No.    Education has been provided regarding the importance of this vaccine. Patient has been advised to call insurance company to determine out of pocket expense if they have not yet received this vaccine. Advised may also receive vaccine at local pharmacy or Health Dept. Verbalized acceptance and understanding.  Screening Tests Health Maintenance  Topic Date Due   Zoster Vaccines- Shingrix (1 of 2) Never done   COVID-19  Vaccine (4 - Booster for Pfizer series) 03/02/2020   TETANUS/TDAP  11/29/2025 (Originally 07/25/1969)   INFLUENZA VACCINE  03/17/2049 (Originally 10/16/2020)   MAMMOGRAM  10/30/2021   COLONOSCOPY (Pts 45-37yrs Insurance coverage will need to be confirmed)  07/26/2024   Pneumonia Vaccine 47+ Years old  Completed   DEXA SCAN  Completed   Hepatitis C Screening  Completed   HPV VACCINES  Aged Out    Health Maintenance  Health Maintenance Due  Topic Date Due   Zoster Vaccines- Shingrix (1 of 2) Never done   COVID-19 Vaccine (4 - Booster for Pfizer series) 03/02/2020    Colorectal cancer screening: Type of screening: Colonoscopy. Completed 07/27/14. Repeat every 10 years  Mammogram status: Completed 10/30/20. Repeat every year  Bone Density status: Ordered 02/12/21. Pt provided with contact info and advised to call to schedule appt.  Lung Cancer Screening: (Low Dose CT Chest recommended if Age 58-80 years, 30 pack-year currently smoking OR have quit w/in 15years.) does not qualify.     Additional Screening:  Hepatitis C Screening: does qualify; Completed 08/27/06  Vision Screening: Recommended annual ophthalmology exams for early detection of glaucoma and other disorders of the eye. Is the patient up to date with their annual eye exam?  No , patient  plans on scheduling an appointment. Who is the provider or what is the name of the office in which the patient attends annual eye exams? Dr. Delman Cheadle   Dental Screening: Recommended annual dental exams for proper oral hygiene  Community Resource Referral / Chronic Care Management: CRR required this visit?  No   CCM required this visit?  No      Plan:     I have personally reviewed and noted the following in the patient's chart:   Medical and social history Use of alcohol, tobacco or illicit drugs  Current medications and supplements including opioid prescriptions.  Functional ability and status Nutritional status Physical activity Advanced directives List of other physicians Hospitalizations, surgeries, and ER visits in previous 12 months Vitals Screenings to include cognitive, depression, and falls Referrals and appointments  In addition, I have reviewed and discussed with patient certain preventive protocols, quality metrics, and best practice recommendations. A written personalized care plan for preventive services as well as general preventive health recommendations were provided to patient.   Due to this being a telephonic visit, the after visit summary with patients personalized plan was offered to patient via mail or my-chart. Patient would like to access on my-chart.  Loma Messing, LPN   38/12/1749   Nurse Health Advisor  Nurse Notes: none

## 2021-02-12 ENCOUNTER — Ambulatory Visit (INDEPENDENT_AMBULATORY_CARE_PROVIDER_SITE_OTHER): Payer: Medicare Other

## 2021-02-12 VITALS — Ht 61.0 in | Wt 198.0 lb

## 2021-02-12 DIAGNOSIS — Z780 Asymptomatic menopausal state: Secondary | ICD-10-CM

## 2021-02-12 DIAGNOSIS — Z0000 Encounter for general adult medical examination without abnormal findings: Secondary | ICD-10-CM

## 2021-02-12 NOTE — Patient Instructions (Signed)
[name] , Thank you for taking time to complete your Medicare Wellness Visit. I appreciate your ongoing commitment to your health goals. Please review the following plan we discussed and let me know if I can assist you in the future.   Screening recommendations/referrals: Colonoscopy: up to date, completed 07/27/14, Due 07/26/24 Mammogram: up to date, completed 10/30/20, due 10/30/21 Bone Density: due last completed 09/14/15, ordered today, someone will call to schedule Recommended yearly ophthalmology/optometry visit for glaucoma screening and checkup Recommended yearly dental visit for hygiene and checkup  Vaccinations: Influenza vaccine: Declined today, please call office or local pharmacy to schedule if you change your mind Pneumococcal vaccine: up to date Tdap vaccine: Due-May obtain vaccine at  your local pharmacy. Shingles vaccine: Declined today, please call your local pharmacy to schedule if you change your mind Covid-19: Discuss with your local pharmacy   Advanced directives: copy on file  Conditions/risks identified: see problem list  Next appointment: Follow up in one year for your annual wellness visit 02/13/22 @ 9:45am, this will be a telephone visit.   Preventive Care 55 Years and Older, Female Preventive care refers to lifestyle choices and visits with your health care provider that can promote health and wellness. What does preventive care include? A yearly physical exam. This is also called an annual well check. Dental exams once or twice a year. Routine eye exams. Ask your health care provider how often you should have your eyes checked. Personal lifestyle choices, including: Daily care of your teeth and gums. Regular physical activity. Eating a healthy diet. Avoiding tobacco and drug use. Limiting alcohol use. Practicing safe sex. Taking low-dose aspirin every day. Taking vitamin and mineral supplements as recommended by your health care provider. What  happens during an annual well check? The services and screenings done by your health care provider during your annual well check will depend on your age, overall health, lifestyle risk factors, and family history of disease. Counseling  Your health care provider may ask you questions about your: Alcohol use. Tobacco use. Drug use. Emotional well-being. Home and relationship well-being. Sexual activity. Eating habits. History of falls. Memory and ability to understand (cognition). Work and work Statistician. Reproductive health. Screening  You may have the following tests or measurements: Height, weight, and BMI. Blood pressure. Lipid and cholesterol levels. These may be checked every 5 years, or more frequently if you are over 46 years old. Skin check. Lung cancer screening. You may have this screening every year starting at age 65 if you have a 30-pack-year history of smoking and currently smoke or have quit within the past 15 years. Fecal occult blood test (FOBT) of the stool. You may have this test every year starting at age 33. Flexible sigmoidoscopy or colonoscopy. You may have a sigmoidoscopy every 5 years or a colonoscopy every 10 years starting at age 23. Hepatitis C blood test. Hepatitis B blood test. Sexually transmitted disease (STD) testing. Diabetes screening. This is done by checking your blood sugar (glucose) after you have not eaten for a while (fasting). You may have this done every 1-3 years. Bone density scan. This is done to screen for osteoporosis. You may have this done starting at age 19. Mammogram. This may be done every 1-2 years. Talk to your health care provider about how often you should have regular mammograms. Talk with your health care provider about your test results, treatment options, and if necessary, the need for more tests. Vaccines  Your health care provider may  recommend certain vaccines, such as: Influenza vaccine. This is recommended every  year. Tetanus, diphtheria, and acellular pertussis (Tdap, Td) vaccine. You may need a Td booster every 10 years. Zoster vaccine. You may need this after age 52. Pneumococcal 13-valent conjugate (PCV13) vaccine. One dose is recommended after age 42. Pneumococcal polysaccharide (PPSV23) vaccine. One dose is recommended after age 26. Talk to your health care provider about which screenings and vaccines you need and how often you need them. This information is not intended to replace advice given to you by your health care provider. Make sure you discuss any questions you have with your health care provider. Document Released: 03/31/2015 Document Revised: 11/22/2015 Document Reviewed: 01/03/2015 Elsevier Interactive Patient Education  2017 [place] Prevention in the Home Falls can cause injuries. They can happen to people of all ages. There are many things you can do to make your home safe and to help prevent falls. What can I do on the outside of my home? Regularly fix the edges of walkways and driveways and fix any cracks. Remove anything that might make you trip as you walk through a door, such as a raised step or threshold. Trim any bushes or trees on the path to your home. Use bright outdoor lighting. Clear any walking paths of anything that might make someone trip, such as rocks or tools. Regularly check to see if handrails are loose or broken. Make sure that both sides of any steps have handrails. Any raised decks and porches should have guardrails on the edges. Have any leaves, snow, or ice cleared regularly. Use sand or salt on walking paths during winter. Clean up any spills in your garage right away. This includes oil or grease spills. What can I do in the bathroom? Use night lights. Install grab bars by the toilet and in the tub and shower. Do not use towel bars as grab bars. Use non-skid mats or decals in the tub or shower. If you need to sit down in the shower, use a  plastic, non-slip stool. Keep the floor dry. Clean up any water that spills on the floor as soon as it happens. Remove soap buildup in the tub or shower regularly. Attach bath mats securely with double-sided non-slip rug tape. Do not have throw rugs and other things on the floor that can make you trip. What can I do in the bedroom? Use night lights. Make sure that you have a light by your bed that is easy to reach. Do not use any sheets or blankets that are too big for your bed. They should not hang down onto the floor. Have a firm chair that has side arms. You can use this for support while you get dressed. Do not have throw rugs and other things on the floor that can make you trip. What can I do in the kitchen? Clean up any spills right away. Avoid walking on wet floors. Keep items that you use a lot in easy-to-reach places. If you need to reach something above you, use a strong step stool that has a grab bar. Keep electrical cords out of the way. Do not use floor polish or wax that makes floors slippery. If you must use wax, use non-skid floor wax. Do not have throw rugs and other things on the floor that can make you trip. What can I do with my stairs? Do not leave any items on the stairs. Make sure that there are handrails on both sides of  the stairs and use them. Fix handrails that are broken or loose. Make sure that handrails are as long as the stairways. Check any carpeting to make sure that it is firmly attached to the stairs. Fix any carpet that is loose or worn. Avoid having throw rugs at the top or bottom of the stairs. If you do have throw rugs, attach them to the floor with carpet tape. Make sure that you have a light switch at the top of the stairs and the bottom of the stairs. If you do not have them, ask someone to add them for you. What else can I do to help prevent falls? Wear shoes that: Do not have high heels. Have rubber bottoms. Are comfortable and fit you  well. Are closed at the toe. Do not wear sandals. If you use a stepladder: Make sure that it is fully opened. Do not climb a closed stepladder. Make sure that both sides of the stepladder are locked into place. Ask someone to hold it for you, if possible. Clearly mark and make sure that you can see: Any grab bars or handrails. First and last steps. Where the edge of each step is. Use tools that help you move around (mobility aids) if they are needed. These include: Canes. Walkers. Scooters. Crutches. Turn on the lights when you go into a dark area. Replace any light bulbs as soon as they burn out. Set up your furniture so you have a clear path. Avoid moving your furniture around. If any of your floors are uneven, fix them. If there are any pets around you, be aware of where they are. Review your medicines with your doctor. Some medicines can make you feel dizzy. This can increase your chance of falling. Ask your doctor what other things that you can do to help prevent falls. This information is not intended to replace advice given to you by your health care provider. Make sure you discuss any questions you have with your health care provider. Document Released: 12/29/2008 Document Revised: 08/10/2015 Document Reviewed: 04/08/2014 Elsevier Interactive Patient Education  2017 [employer].

## 2021-02-13 NOTE — Telephone Encounter (Signed)
I have reviewed the care management and care coordination activities outlined in this encounter and I am certifying that I agree with the content of this note. No further action required.  Debbora Dus, PharmD Clinical Pharmacist [facility] [phone]

## 2021-02-21 ENCOUNTER — Other Ambulatory Visit: Payer: Self-pay | Admitting: Nurse Practitioner

## 2021-02-21 DIAGNOSIS — I4892 Unspecified atrial flutter: Secondary | ICD-10-CM

## 2021-02-23 ENCOUNTER — Telehealth (INDEPENDENT_AMBULATORY_CARE_PROVIDER_SITE_OTHER): Payer: Medicare Other | Admitting: Psychiatry

## 2021-02-23 ENCOUNTER — Encounter (HOSPITAL_COMMUNITY): Payer: Self-pay | Admitting: Psychiatry

## 2021-02-23 ENCOUNTER — Other Ambulatory Visit: Payer: Self-pay

## 2021-02-23 DIAGNOSIS — F331 Major depressive disorder, recurrent, moderate: Secondary | ICD-10-CM | POA: Diagnosis not present

## 2021-02-23 MED ORDER — ALPRAZOLAM 0.5 MG PO TABS: 0.5000 mg | ORAL_TABLET | Freq: Two times a day (BID) | ORAL | 2 refills | Status: DC

## 2021-02-23 MED ORDER — TEMAZEPAM 15 MG PO CAPS: 15.0000 mg | ORAL_CAPSULE | Freq: Every evening | ORAL | 2 refills | Status: DC | PRN

## 2021-02-23 NOTE — Progress Notes (Signed)
Virtual Visit via Video Note  I connected with Alexandria Leach on 02/23/21 at  8:40 AM EST by a video enabled telemedicine application and verified that I am speaking with the correct person using two identifiers.  Location: Patient: home Provider: home office   I discussed the limitations of evaluation and management by telemedicine and the availability of in person appointments. The patient expressed understanding and agreed to proceed.     I discussed the assessment and treatment plan with the patient. The patient was provided an opportunity to ask questions and all were answered. The patient agreed with the plan and demonstrated an understanding of the instructions.   The patient was advised to call back or seek an in-person evaluation if the symptoms worsen or if the condition fails to improve as anticipated.  I provided 13 minutes of non-face-to-face time during this encounter.   [name], MD  [facility] MD/PA/NP OP Progress Note  02/23/2021 8:59 AM Alexandria Leach  MRN:  [id number]  Chief Complaint:  Chief Complaint   Anxiety; Depression; Follow-up    HPI: This patient is a 70 year old separated black female who lives alone in [place].  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  She has 4 sons.  The patient returns for follow-up after 3 months.  She states that she is currently not sleeping well.  She lies down every night around 930 or 10 but simply does not go to sleep she is only getting about 3 hours of sleep at night.  She denies being depressed or sad.  Her health conditions are doing well right now.  She is no longer having atrial flutter.  She denies significant pain levels at night.  Since she has had interactions with her cardiac medication Tykosyn and some of the antidepressants I suggested that we try a medication simply for sleep such as Restoril and she is willing to give this a  try.  She continues to use the Xanax and her anxiety is under good control Visit Diagnosis:    ICD-10-CM   1. Major depressive disorder, recurrent episode, moderate ([place])  F33.1       Past Psychiatric History: none  Past Medical History:  Past Medical History:  Diagnosis Date   Ankle fracture, left 02/19/2015   from a fall   Anxiety    Cataract 2014   corrected with surgery   CHF (congestive heart failure) (HCC)    CKD (chronic kidney disease) stage 3, GFR 30-59 ml/min (HCC)    saw nephrologist Dr Harden Mo   DDD (degenerative disc disease), cervical    Depression    Fibromyalgia    Glaucoma    s/p surgery, sees ophtho Q6 mo   History of DVT (deep vein thrombosis) several times latest 2012   receives coumadin while hospitalized   History of kidney stones 2010   History of pulmonary embolism 2001, 2006   completed coumadin courses   History of rheumatic fever x3   HLD (hyperlipidemia)    HTN (hypertension)    Insomnia    Lung nodule 09/22/2012   RLL - 61mm, stable since 2014. Thought benign.    Osteoarthritis    shoulders and knees, not RA per Dr Estanislado Pandy, positive ANA, positive Ro   Osteopenia 09/18/2015   DEXA T -1.1 hip, -0.2 spine 08/2015    Personal history of urinary calculi latest 2014   Pneumonia 12/02/2011   PONV (postoperative nausea  and vomiting)    Refusal of blood transfusions as patient is Jehovah's Witness    Rheumatic heart disease 1980   s/p mitral valve repair 1980   Sjogren's syndrome ([place])    Trimalleolar fracture of left ankle 02/23/2015    Past Surgical History:  Procedure Laterality Date   BREAST BIOPSY Right 2006   benign   CARDIOVERSION N/A 07/18/2020   Procedure: CARDIOVERSION;  Surgeon: Elouise Munroe, MD;  [facility] ENDOSCOPY;  Service: Cardiovascular;  Laterality: N/A;   CHOLECYSTECTOMY  11/27/2011   Procedure: LAPAROSCOPIC CHOLECYSTECTOMY WITH INTRAOPERATIVE CHOLANGIOGRAM;  Surgeon: Adin Hector, MD;  Location: [place];  Service:  General;  Laterality: N/A;  laparoscopic cholecystectomy with choleangiogram umbilical hernia repair   COLONOSCOPY  07/2014   WNL Amedeo Plenty)   COLONOSCOPY  08/2019   3 TAs, rpt 3 yrs ([place])   dexa  08/2012   normal per patient - no records available   ESOPHAGOGASTRODUODENOSCOPY  08/2019   reactive gastropathy, neg H pylori ([place])   EYE SURGERY Bilateral 2014   cataract removal   MITRAL VALVE REPAIR  1980   open heart   ORIF ANKLE FRACTURE Left 02/26/2015   Procedure: OPEN REDUCTION INTERNAL FIXATION (ORIF) LEFT TRIMALLEOLAR ANKLE FRACTURE;  Surgeon: [name], MD;  Location: [place];  Service: Orthopedics;  Laterality: Left;   TUBAL LIGATION  9924   UMBILICAL HERNIA REPAIR  11/27/2011   Procedure: HERNIA REPAIR UMBILICAL ADULT;  Surgeon: Adin Hector, MD;  Location: [place];  Service: General;  Laterality: N/A;   [place]   for fibroids -- partial, ovaries remain    Family Psychiatric History: see below  Family History:  Family History  Problem Relation Age of Onset   Cancer Mother        lung (nonsmoker)   CAD Mother        MI in her 33s   ALS Mother    Kidney disease Father    Alcohol abuse Father    Diabetes Father    Lupus Sister        and niece   Diabetes Sister    Stroke Sister    Depression Sister    Lupus Sister    Blindness Sister    Cancer Maternal Uncle        bone   Stroke Maternal Grandmother    Cancer Brother        bone   Diabetes Brother    Heart attack Brother    Healthy Son    Healthy Son    Healthy Son    Healthy Son    Kidney failure Other        on HD   Breast cancer Neg Hx     Social History:  Social History   Socioeconomic History   Marital status: Married    Spouse name: [name]   Number of children: 4   Years of education: 12   Highest education level: Not on file  Occupational History    Employer: UNEMPLOYED    Comment: Disability  Tobacco Use   Smoking status: Never   Smokeless tobacco: Never   Vaping Use   Vaping Use: Never used  Substance and Sexual Activity   Alcohol use: No    Alcohol/week: 0.0 standard drinks   Drug use: No   Sexual activity: Not Currently    Birth control/protection: Surgical  Other Topics Concern   Not on file  Social History Narrative   Lives with  son, 1 dog   Occupation: unemployed, on disability for fibromyalgia since 2008.   Edu: HS   Religion: Jehova's witness   Activity: volunteers at senior center   Diet: some water, fruits/vegetables daily   No caffeine use   Social Determinants of Health   Financial Resource Strain: Low Risk    Difficulty of Paying Living Expenses: Not hard at all  Food Insecurity: No Food Insecurity   Worried About Charity fundraiser in the Last Year: Never true   [place] in the Last Year: Never true  Transportation Needs: No Transportation Needs   Lack of Transportation (Medical): No   Lack of Transportation (Non-Medical): No  Physical Activity: Sufficiently Active   Days of Exercise per Week: 7 days   Minutes of Exercise per Session: 50 min  Stress: No Stress Concern Present   Feeling of Stress : Only a little  Social Connections: Moderately Integrated   Frequency of Communication with Friends and Family: More than three times a week   Frequency of Social Gatherings with Friends and Family: Never   Attends Religious Services: More than 4 times per year   Active Member of [employer] or Organizations: No   Attends Archivist Meetings: Never   Marital Status: Married    Allergies:  Allergies  Allergen Reactions   Cymbalta [Duloxetine Hcl] Other (See Comments)    tachycardia   Statins Nausea Only and Other (See Comments)    Muscle cramps also   Sulfa Antibiotics Nausea And Vomiting    Metabolic Disorder Labs: Lab Results  Component Value Date   HGBA1C 6.0 (H) 01/12/2021   MPG 126 01/12/2021   MPG 114 10/13/2015   No results found for: PROLACTIN Lab Results  Component Value Date    CHOL 193 01/12/2021   TRIG 93 01/12/2021   HDL 70 01/12/2021   CHOLHDL 2.8 01/12/2021   VLDL 14.4 12/23/2019   LDLCALC 104 (H) 01/12/2021   LDLCALC 82 05/29/2020   Lab Results  Component Value Date   TSH 1.49 01/12/2021   TSH 4.12 12/23/2019    Therapeutic Level Labs: No results found for: LITHIUM No results found for: VALPROATE No components found for:  CBMZ  Current Medications: Current Outpatient Medications  Medication Sig Dispense Refill   temazepam (RESTORIL) 15 MG capsule Take 1 capsule (15 mg total) by mouth at bedtime as needed for sleep. 30 capsule 2   ALPRAZolam (XANAX) 0.5 MG tablet Take 1 tablet (0.5 mg total) by mouth 2 (two) times daily. 180 tablet 2   antiseptic oral rinse (BIOTENE) LIQD 15 mLs by Mouth Rinse route 2 (two) times daily as needed for dry mouth.     cholecalciferol (VITAMIN D) 1000 UNITS tablet Take 1,000 Units by mouth daily.     co-enzyme Q-10 30 MG capsule Take 30 mg by mouth daily.     diclofenac sodium (VOLTAREN) 1 % GEL Apply 1 application topically 2 (two) times daily as needed (pain).     diltiazem (CARDIZEM CD) 360 MG 24 hr capsule TAKE 1 CAPSULE BY MOUTH  DAILY 90 capsule 3   docusate sodium (COLACE) 100 MG capsule Take 100 mg by mouth daily.     dofetilide (TIKOSYN) 250 MCG capsule Take 1 capsule (250 mcg total) by mouth 2 (two) times daily. 180 capsule 1   ELIQUIS 5 MG TABS tablet TAKE 1 TABLET BY MOUTH  TWICE DAILY 180 tablet 3   ezetimibe (ZETIA) 10 MG tablet TAKE 1 TABLET  BY MOUTH AT  BEDTIME 90 tablet 3   furosemide (LASIX) 20 MG tablet TAKE 1 TABLET BY MOUTH  DAILY AS NEEDED FOR EDEMA 90 tablet 3   gabapentin (NEURONTIN) 300 MG capsule TAKE 2 CAPSULES BY MOUTH IN THE MORNING , 1 CAPSULE IN  THE AFTERNOON AND 2  CAPSULES AT BEDTIME 450 capsule 3   losartan (COZAAR) 50 MG tablet Take 1 tablet (50 mg total) by mouth daily. 90 tablet 3   lovastatin (MEVACOR) 10 MG tablet TAKE 1 TABLET BY MOUTH  EVERY MONDAY, WEDNESDAY,  AND FRIDAY 39  tablet 3   methylPREDNISolone (MEDROL DOSEPAK) 4 MG TBPK tablet Take as directed 21 tablet 0   metoprolol tartrate (LOPRESSOR) 50 MG tablet Take 1.5 tablets (75 mg total) by mouth 2 (two) times daily. 270 tablet 3   pantoprazole (PROTONIX) 40 MG tablet Take 40 mg by mouth daily.     pantoprazole (PROTONIX) 40 MG tablet Take 1 tablet by mouth daily.     Polyethyl Glycol-Propyl Glycol (SYSTANE) 0.4-0.3 % GEL ophthalmic gel Place 1 drop into both eyes 2 (two) times daily.      polyethylene glycol powder (GLYCOLAX/MIRALAX) 17 GM/SCOOP powder Take 8.5-17 g by mouth daily as needed for moderate constipation. (1/2-1 capful daily as needed) 3350 g 1   RESTASIS 0.05 % ophthalmic emulsion INSTILL ONE DROP IN BOTH  EYES TWO TIMES DAILY 180 each 0   thiamine (VITAMIN B-1) 50 MG tablet Take 1 tablet (50 mg total) by mouth daily.     traMADol (ULTRAM) 50 MG tablet Take 1 tablet (50 mg total) by mouth daily as needed. 30 tablet 0   vitamin B-12 (CYANOCOBALAMIN) 1000 MCG tablet Take 1 tablet (1,000 mcg total) by mouth every Monday, Wednesday, and Friday.     No current facility-administered medications for this visit.     Musculoskeletal: Strength & Muscle Tone: na Gait & Station: na Patient leans: N/A  Psychiatric Specialty Exam: Review of Systems  Musculoskeletal:  Positive for arthralgias.  Psychiatric/Behavioral:  Positive for sleep disturbance.   All other systems reviewed and are negative.  There were no vitals taken for this visit.There is no height or weight on file to calculate BMI.  General Appearance: Casual and Fairly Groomed  Eye Contact:  Good  Speech:  Clear and Coherent  Volume:  Normal  Mood:  Euthymic  Affect:  Appropriate and Congruent  Thought Process:  Goal Directed  Orientation:  Full (Time, Place, and Person)  Thought Content: WDL   Suicidal Thoughts:  No  Homicidal Thoughts:  No  Memory:  Immediate;   Good Recent;   Good Remote;   Fair  Judgement:  Good  Insight:   Good  Psychomotor Activity:  Normal  Concentration:  Concentration: Good and Attention Span: Good  Recall:  Good  Fund of Knowledge: Good  Language: Good  Akathisia:  No  Handed:  Right  AIMS (if indicated): not done  Assets:  Communication Skills Desire for Improvement Resilience Social Support  ADL's:  Intact  Cognition: WNL  Sleep:  Poor   Screenings: GAD-7    Flowsheet Row Office Visit from 12/28/2019 in [place] at [facility] Visit from 12/22/2018 in [place] at [facility]  Total GAD-7 Score 0 [place] from 12/11/2017 in [place] at [place] from 12/09/2016 in [place] at [place] from 11/30/2015 in [place] at  [place]  Total Score (max 30 points ) 20 19 18       PHQ2-9    Flowsheet Row Video Visit from 02/23/2021 in [place] Video Visit from 12/05/2020 in [place] Video Visit from 08/18/2020 in [place] Video Visit from 05/24/2020 in [place] Office Visit from 12/28/2019 in [place] at [facility]  PHQ-2 Total Score 0 0 0 0 0  PHQ-9 Total Score -- -- -- -- 1      Flowsheet Row Video Visit from 02/23/2021 in [place] [facility] from 01/25/2021 in [facility] at [facility] Video Visit from 12/05/2020 in [place] No Risk No Risk No Risk        Assessment and Plan: This patient is a 70 year old female with a history of depression and anxiety.  She is doing well except for her sleep difficulty.  She will continue Xanax 0.5 mg twice daily as needed for anxiety.  We will add Restoril 15 mg at bedtime for sleep.  She will return  to see me in 3 months with call sooner if the Restoril does not help   [name], MD 02/23/2021, 8:59 AM

## 2021-02-27 ENCOUNTER — Telehealth: Payer: Self-pay | Admitting: Cardiology

## 2021-02-27 ENCOUNTER — Encounter: Payer: Self-pay | Admitting: Cardiology

## 2021-02-27 MED ORDER — AMOXICILLIN 500 MG PO TABS: ORAL_TABLET | ORAL | 1 refills | Status: DC

## 2021-02-27 NOTE — Telephone Encounter (Signed)
°  Pt would like to f/u her mychart message

## 2021-02-27 NOTE — Telephone Encounter (Signed)
Patient called - aware Rx was sent in

## 2021-02-27 NOTE — Telephone Encounter (Signed)
Me to [name]     3:32 PM Hello Alexandria Leach - the message has been sent to Dr. Stanford Breed to review  Last read by [name] at 3:37 PM on 02/27/2021.  [name] to P Cv Div Nl Triage (supporting Crenshaw, [name], MD)     12:43 PM YES ,BUT I AM ON DOFETILDE NOW AND THEY SAID I HAVE TO BE CAREFUL WHICH ONE TO TAKE,I TOOK AMOCILLIN BEFORE      12:20 PM  You routed this conversation to [place], [name], MD  [name], RN  Me to [name]     12:20 PM Hi Alexandria Leach - Have you taken antibiotics before dental cleanings in the past? Which med did you take?  Last read by [name] at 3:37 PM on 02/27/2021.      12:16 PM [name], RN routed this conversation to [name], RN   Alexandria Leach Herbin Moser to P Cv Div Nl Triage (supporting Lelon Perla, MD)     12:01 PM I NEED A PERSCRIPTION WRITTEN FOR DENTAL CLEANING ON 02/28/21 FOR ANTIBOTIC,DENTIST WILL NOT PRESCRIBLE BECAUSE OF HEART PROBLEMS.PLEASE SEND TO [facility] ON [address] [place].NEED BEFORE 11:30 TOMMORROW

## 2021-02-27 NOTE — Telephone Encounter (Signed)
Patient needs SBP for mitral valve repair. Ok to take amoxicillin. Will send in Rx

## 2021-02-28 MED ORDER — AMOXICILLIN 500 MG PO TABS: ORAL_TABLET | ORAL | 1 refills | Status: DC

## 2021-03-13 ENCOUNTER — Ambulatory Visit: Payer: Medicaid Other | Admitting: Podiatry

## 2021-03-15 ENCOUNTER — Ambulatory Visit (HOSPITAL_COMMUNITY)
Admission: RE | Admit: 2021-03-15 | Discharge: 2021-03-15 | Disposition: A | Discharge status: Home / Self Care | Payer: Medicare Other | Source: Ambulatory Visit | Attending: Physician Assistant | Admitting: Physician Assistant

## 2021-03-15 ENCOUNTER — Encounter (HOSPITAL_COMMUNITY): Payer: Self-pay | Admitting: Physician Assistant

## 2021-03-15 ENCOUNTER — Other Ambulatory Visit: Payer: Self-pay

## 2021-03-15 VITALS — BP 142/74 | HR 75 | Ht 61.0 in | Wt 197.6 lb

## 2021-03-15 DIAGNOSIS — N183 Chronic kidney disease, stage 3 unspecified: Secondary | ICD-10-CM | POA: Diagnosis not present

## 2021-03-15 DIAGNOSIS — Z7901 Long term (current) use of anticoagulants: Secondary | ICD-10-CM | POA: Diagnosis not present

## 2021-03-15 DIAGNOSIS — I130 Hypertensive heart and chronic kidney disease with heart failure and stage 1 through stage 4 chronic kidney disease, or unspecified chronic kidney disease: Secondary | ICD-10-CM | POA: Diagnosis not present

## 2021-03-15 DIAGNOSIS — D6869 Other thrombophilia: Secondary | ICD-10-CM

## 2021-03-15 DIAGNOSIS — Z560 Unemployment, unspecified: Secondary | ICD-10-CM | POA: Diagnosis not present

## 2021-03-15 DIAGNOSIS — I484 Atypical atrial flutter: Secondary | ICD-10-CM

## 2021-03-15 DIAGNOSIS — Z79899 Other long term (current) drug therapy: Secondary | ICD-10-CM | POA: Diagnosis not present

## 2021-03-15 DIAGNOSIS — I5032 Chronic diastolic (congestive) heart failure: Secondary | ICD-10-CM | POA: Insufficient documentation

## 2021-03-15 LAB — BASIC METABOLIC PANEL
Anion gap: 9 (ref 5–15)
BUN: 12 mg/dL (ref 8–23)
CO2: 20 mmol/L — ABNORMAL LOW (ref 22–32)
Calcium: 9.3 mg/dL (ref 8.9–10.3)
Chloride: 110 mmol/L (ref 98–111)
Creatinine, Ser: 1.38 mg/dL — ABNORMAL HIGH (ref 0.44–1.00)
GFR, Estimated: 42 mL/min — ABNORMAL LOW (ref >60)
Glucose, Bld: 144 mg/dL — ABNORMAL HIGH (ref 70–99)
Potassium: 3.5 mmol/L (ref 3.5–5.1)
Sodium: 139 mmol/L (ref 135–145)

## 2021-03-15 LAB — MAGNESIUM: Magnesium: 2 mg/dL (ref 1.7–2.4)

## 2021-03-15 NOTE — Progress Notes (Signed)
Primary Care Physician: Ria Bush, MD Referring Physician: Dr. Stanford Breed Primary EP: Dr Greig Right Alexandria Leach is a 70 y.o. female with a h/o new onset typical atrial flutter in April 2022. H/o of rheumatic heart disease with remote repair pf mitral valve in 1980, CHF, CKD.  She was placed on anticoagulation and had a succession CV but ERAfl. She felt much improved while in SR. Patient is s/p dofetilide admission 8/8-8/11/22. She converted with medication and did not require DCCV.   On follow up today, patient reports that she has done very well since her last visit. She denies any heart racing or palpitations. No bleeding issues on anticoagulation.    Today, she denies symptoms of palpitations, chest pain, shortness of breath, orthopnea, PND, lower extremity edema, dizziness, presyncope, syncope, or neurologic sequela. The patient is tolerating medications without difficulties and is otherwise without complaint today.   Past Medical History:  Diagnosis Date   Ankle fracture, left 02/19/2015   from a fall   Anxiety    Cataract 2014   corrected with surgery   CHF (congestive heart failure) (HCC)    CKD (chronic kidney disease) stage 3, GFR 30-59 ml/min [facility])    saw nephrologist Dr Harden Mo   DDD (degenerative disc disease), cervical    Depression    Fibromyalgia    Glaucoma    s/p surgery, sees ophtho Q6 mo   History of DVT (deep vein thrombosis) several times latest 2012   receives coumadin while hospitalized   History of kidney stones 2010   History of pulmonary embolism 2001, 2006   completed coumadin courses   History of rheumatic fever x3   HLD (hyperlipidemia)    HTN (hypertension)    Insomnia    Lung nodule 09/22/2012   RLL - 34mm, stable since 2014. Thought benign.    Osteoarthritis    shoulders and knees, not RA per Dr Estanislado Pandy, positive ANA, positive Ro   Osteopenia 09/18/2015   DEXA T -1.1 hip, -0.2 spine 08/2015    Personal history of urinary calculi  latest 2014   Pneumonia 12/02/2011   PONV (postoperative nausea and vomiting)    Refusal of blood transfusions as patient is Jehovah's Witness    Rheumatic heart disease 1980   s/p mitral valve repair 1980   Sjogren's syndrome ([place])    Trimalleolar fracture of left ankle 02/23/2015   Past Surgical History:  Procedure Laterality Date   BREAST BIOPSY Right 2006   benign   CARDIOVERSION N/A 07/18/2020   Procedure: CARDIOVERSION;  Surgeon: Elouise Munroe, MD;  Location: [facility] ENDOSCOPY;  Service: Cardiovascular;  Laterality: N/A;   CHOLECYSTECTOMY  11/27/2011   Procedure: LAPAROSCOPIC CHOLECYSTECTOMY WITH INTRAOPERATIVE CHOLANGIOGRAM;  Surgeon: Adin Hector, MD;  Location: [place];  Service: General;  Laterality: N/A;  laparoscopic cholecystectomy with choleangiogram umbilical hernia repair   COLONOSCOPY  07/2014   WNL [facility])   COLONOSCOPY  08/2019   3 TAs, rpt 3 yrs ([place])   dexa  08/2012   normal per patient - no records available   ESOPHAGOGASTRODUODENOSCOPY  08/2019   reactive gastropathy, neg H pylori ([place])   EYE SURGERY Bilateral 2014   cataract removal   MITRAL VALVE REPAIR  1980   open heart   ORIF ANKLE FRACTURE Left 02/26/2015   Procedure: OPEN REDUCTION INTERNAL FIXATION (ORIF) LEFT TRIMALLEOLAR ANKLE FRACTURE;  Surgeon: [name], MD;  Location: [place];  Service: Orthopedics;  Laterality: Left;   [place]  UMBILICAL HERNIA REPAIR  11/27/2011   Procedure: HERNIA REPAIR UMBILICAL ADULT;  Surgeon: Adin Hector, MD;  Location: [place];  Service: General;  Laterality: N/A;   [place]   for fibroids -- partial, ovaries remain    Current Outpatient Medications  Medication Sig Dispense Refill   ALPRAZolam (XANAX) 0.5 MG tablet Take 1 tablet (0.5 mg total) by mouth 2 (two) times daily. 180 tablet 2   amoxicillin (AMOXIL) 500 MG tablet Take 4 tablets (2000mg ) by mouth 30-60 min prior to dental procedure. 4 tablet 1   antiseptic oral  rinse (BIOTENE) LIQD 15 mLs by Mouth Rinse route 2 (two) times daily as needed for dry mouth.     cholecalciferol (VITAMIN D) 1000 UNITS tablet Take 1,000 Units by mouth daily.     co-enzyme Q-10 30 MG capsule Take 30 mg by mouth daily.     diclofenac sodium (VOLTAREN) 1 % GEL Apply 1 application topically 2 (two) times daily as needed (pain).     diltiazem (CARDIZEM CD) 360 MG 24 hr capsule TAKE 1 CAPSULE BY MOUTH  DAILY 90 capsule 3   docusate sodium (COLACE) 100 MG capsule Take 100 mg by mouth daily.     dofetilide (TIKOSYN) 250 MCG capsule Take 1 capsule (250 mcg total) by mouth 2 (two) times daily. 180 capsule 1   ELIQUIS 5 MG TABS tablet TAKE 1 TABLET BY MOUTH  TWICE DAILY 180 tablet 3   ezetimibe (ZETIA) 10 MG tablet TAKE 1 TABLET BY MOUTH AT  BEDTIME 90 tablet 3   furosemide (LASIX) 20 MG tablet TAKE 1 TABLET BY MOUTH  DAILY AS NEEDED FOR EDEMA 90 tablet 3   gabapentin (NEURONTIN) 300 MG capsule TAKE 2 CAPSULES BY MOUTH IN THE MORNING , 1 CAPSULE IN  THE AFTERNOON AND 2  CAPSULES AT BEDTIME 450 capsule 3   lovastatin (MEVACOR) 10 MG tablet TAKE 1 TABLET BY MOUTH  EVERY MONDAY, WEDNESDAY,  AND FRIDAY 39 tablet 3   metoprolol tartrate (LOPRESSOR) 50 MG tablet Take 1.5 tablets (75 mg total) by mouth 2 (two) times daily. 270 tablet 3   pantoprazole (PROTONIX) 40 MG tablet Take 40 mg by mouth daily.     Polyethyl Glycol-Propyl Glycol (SYSTANE) 0.4-0.3 % GEL ophthalmic gel Place 1 drop into both eyes 2 (two) times daily.      polyethylene glycol powder (GLYCOLAX/MIRALAX) 17 GM/SCOOP powder Take 8.5-17 g by mouth daily as needed for moderate constipation. (1/2-1 capful daily as needed) 3350 g 1   RESTASIS 0.05 % ophthalmic emulsion INSTILL ONE DROP IN BOTH  EYES TWO TIMES DAILY 180 each 0   temazepam (RESTORIL) 15 MG capsule Take 1 capsule (15 mg total) by mouth at bedtime as needed for sleep. 30 capsule 2   thiamine (VITAMIN B-1) 50 MG tablet Take 1 tablet (50 mg total) by mouth daily.      traMADol (ULTRAM) 50 MG tablet Take 1 tablet (50 mg total) by mouth daily as needed. 30 tablet 0   vitamin B-12 (CYANOCOBALAMIN) 1000 MCG tablet Take 1 tablet (1,000 mcg total) by mouth every Monday, Wednesday, and Friday.     losartan (COZAAR) 50 MG tablet Take 1 tablet (50 mg total) by mouth daily. 90 tablet 3   No current facility-administered medications for this encounter.    Allergies  Allergen Reactions   Cymbalta [Duloxetine Hcl] Other (See Comments)    tachycardia   Statins Nausea Only and Other (See Comments)    Muscle  cramps also   Sulfa Antibiotics Nausea And Vomiting    Social History   Socioeconomic History   Marital status: Married    Spouse name: [name]   Number of children: 4   Years of education: 12   Highest education level: Not on file  Occupational History    Employer: UNEMPLOYED    Comment: Disability  Tobacco Use   Smoking status: Never   Smokeless tobacco: Never  Vaping Use   Vaping Use: Never used  Substance and Sexual Activity   Alcohol use: No    Alcohol/week: 0.0 standard drinks   Drug use: No   Sexual activity: Not Currently    Birth control/protection: Surgical  Other Topics Concern   Not on file  Social History Narrative   Lives with son, 1 dog   Occupation: unemployed, on disability for fibromyalgia since 2008.   Edu: HS   Religion: Jehova's witness   Activity: volunteers at senior center   Diet: some water, fruits/vegetables daily   No caffeine use   Social Determinants of Health   Financial Resource Strain: Low Risk    Difficulty of Paying Living Expenses: Not hard at all  Food Insecurity: No Food Insecurity   Worried About Charity fundraiser in the Last Year: Never true   [place] in the Last Year: Never true  Transportation Needs: No Transportation Needs   Lack of Transportation (Medical): No   Lack of Transportation (Non-Medical): No  Physical Activity: Sufficiently Active   Days of Exercise per Week: 7 days    Minutes of Exercise per Session: 50 min  Stress: No Stress Concern Present   Feeling of Stress : Only a little  Social Connections: Moderately Integrated   Frequency of Communication with Friends and Family: More than three times a week   Frequency of Social Gatherings with Friends and Family: Never   Attends Religious Services: More than 4 times per year   Active Member of [employer] or Organizations: No   Attends Music therapist: Never   Marital Status: Married  Human resources officer Violence: Not At Risk   Fear of Current or Ex-Partner: No   Emotionally Abused: No   Physically Abused: No   Sexually Abused: No    Family History  Problem Relation Age of Onset   Cancer Mother        lung (nonsmoker)   CAD Mother        MI in her 7s   ALS Mother    Kidney disease Father    Alcohol abuse Father    Diabetes Father    Lupus Sister        and niece   Diabetes Sister    Stroke Sister    Depression Sister    Lupus Sister    Blindness Sister    Cancer Maternal Uncle        bone   Stroke Maternal Grandmother    Cancer Brother        bone   Diabetes Brother    Heart attack Brother    Healthy Son    Healthy Son    Healthy Son    Healthy Son    Kidney failure Other        on HD   Breast cancer Neg Hx     ROS- All systems are reviewed and negative except as per the HPI above  Physical Exam: Vitals:   03/15/21 0949  BP: (!) 142/74  Pulse: 75  Weight: 89.6 kg  Height: 5\' 1"  (1.549 m)     Wt Readings from Last 3 Encounters:  03/15/21 89.6 kg  02/12/21 89.8 kg  01/26/21 91.9 kg    Labs: Lab Results  Component Value Date   NA 140 01/12/2021   K 4.0 01/12/2021   CL 107 01/12/2021   CO2 22 01/12/2021   GLUCOSE 93 01/12/2021   BUN 13 01/12/2021   CREATININE 1.29 (H) 01/12/2021   CALCIUM 10.2 01/12/2021   PHOS 3.2 07/11/2020   MG 2.0 11/27/2020   Lab Results  Component Value Date   INR 0.93 10/12/2015   Lab Results  Component Value Date   CHOL  193 01/12/2021   HDL 70 01/12/2021   LDLCALC 104 (H) 01/12/2021   TRIG 93 01/12/2021    GEN- The patient is a well appearing obese female, alert and oriented x 3 today.   HEENT-head normocephalic, atraumatic, sclera clear, conjunctiva pink, hearing intact, trachea midline. Lungs- Clear to ausculation bilaterally, normal work of breathing Heart- Regular rate and rhythm, no rubs or gallops, 6-5/4 systolic murmur   GI- soft, NT, ND, + BS Extremities- no clubbing, cyanosis, or edema MS- no significant deformity or atrophy Skin- no rash or lesion Psych- euthymic mood, full affect Neuro- strength and sensation are intact   EKG- SR, NST Vent. rate 75 BPM PR interval 192 ms QRS duration 84 ms QT/QTcB 404/451 ms  CHA2DS2-VASc Score = 4  The patient's score is based upon: CHF History: 1 HTN History: 1 Diabetes History: 0 Stroke History: 0 Vascular Disease History: 0 Age Score: 1 Gender Score: 1      ASSESSMENT AND PLAN: 1. Atypical atrial flutter The patient's CHA2DS2-VASc score is 4, indicating a 4.8% annual risk of stroke.   S/p dofetilide admission 8/8-8/11/22 Patient appears to be maintaining SR. Continue dofetilide 250 mcg BID. QT stable. Check bmet/mag today. Continue Eliquis 5 mg BID Continue diltiazem 360 mg daily Continue Lopressor 75 mg BID  2. Secondary Hypercoagulable State (ICD10:  D68.69) The patient is at significant risk for stroke/thromboembolism based upon her CHA2DS2-VASc Score of 4.  Continue Apixaban (Eliquis).   3. HFpEF  No signs or symptoms of fluid overload.  4. HTN Stable, no changes today.   Follow up with Dr Lovena Le as scheduled. AF clinic in 6 months.    [facility] [address] [place], [place] [phone]

## 2021-03-16 ENCOUNTER — Other Ambulatory Visit (HOSPITAL_COMMUNITY): Payer: Self-pay | Admitting: *Deleted

## 2021-03-16 ENCOUNTER — Encounter: Payer: Self-pay | Admitting: Cardiology

## 2021-03-16 MED ORDER — POTASSIUM CHLORIDE ER 10 MEQ PO TBCR: 10.0000 meq | EXTENDED_RELEASE_TABLET | Freq: Every day | ORAL | 3 refills | Status: DC

## 2021-03-16 MED ORDER — AMOXICILLIN 500 MG PO TABS: ORAL_TABLET | ORAL | 5 refills | Status: DC

## 2021-03-27 ENCOUNTER — Other Ambulatory Visit (HOSPITAL_COMMUNITY): Payer: Self-pay | Admitting: Psychiatry

## 2021-03-29 ENCOUNTER — Other Ambulatory Visit: Payer: Self-pay | Admitting: Cardiology

## 2021-03-29 ENCOUNTER — Other Ambulatory Visit: Payer: Self-pay | Admitting: Physician Assistant

## 2021-03-29 DIAGNOSIS — I10 Essential (primary) hypertension: Secondary | ICD-10-CM

## 2021-03-30 ENCOUNTER — Encounter (HOSPITAL_COMMUNITY): Payer: Self-pay | Admitting: Psychiatry

## 2021-03-30 ENCOUNTER — Telehealth (INDEPENDENT_AMBULATORY_CARE_PROVIDER_SITE_OTHER): Payer: Medicare Other | Admitting: Psychiatry

## 2021-03-30 ENCOUNTER — Other Ambulatory Visit: Payer: Self-pay

## 2021-03-30 DIAGNOSIS — F331 Major depressive disorder, recurrent, moderate: Secondary | ICD-10-CM | POA: Diagnosis not present

## 2021-03-30 MED ORDER — ALPRAZOLAM 0.5 MG PO TABS: 0.5000 mg | ORAL_TABLET | Freq: Three times a day (TID) | ORAL | 2 refills | Status: DC | PRN

## 2021-03-30 MED ORDER — TEMAZEPAM 30 MG PO CAPS: 30.0000 mg | ORAL_CAPSULE | Freq: Every evening | ORAL | 2 refills | Status: DC | PRN

## 2021-03-30 NOTE — Progress Notes (Signed)
Virtual Visit via Telephone Note  I connected with Alexandria Leach on 03/30/21 at  9:00 AM EST by telephone and verified that I am speaking with the correct person using two identifiers.  Location: Patient: home Provider: office   I discussed the limitations, risks, security and privacy concerns of performing an evaluation and management service by telephone and the availability of in person appointments. I also discussed with the patient that there may be a patient responsible charge related to this service. The patient expressed understanding and agreed to proceed.      I discussed the assessment and treatment plan with the patient. The patient was provided an opportunity to ask questions and all were answered. The patient agreed with the plan and demonstrated an understanding of the instructions.   The patient was advised to call back or seek an in-person evaluation if the symptoms worsen or if the condition fails to improve as anticipated.  I provided 14 minutes of non-face-to-face time during this encounter.   [name], MD  [facility] MD/PA/NP OP Progress Note  03/30/2021 9:16 AM Alexandria Leach  MRN:  [id number]  Chief Complaint: insomnia HPI: This patient is a 71 year old separated black female who lives alone in [place].  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  She has 4 sons.  Patient returns for follow-up after 4 weeks.  Her main concern last time was insomnia.  We tried temazepam 15 mg.  It is helping a little bit but she still waking up in the middle of the night unable to sleep.  She does not want to go back to Ambien's or Ambien CR because she found them very hard to get off thoughts.  She denies significant depression and the Xanax is helping her anxiety.  Her new medication for atrial fibrillation is working well.  I suggested we go up on the temazepam and she agrees.  I can also provide  extra Xanax if she wakes up through the night that so she can take 1 to help her get back to sleep. Visit Diagnosis:    ICD-10-CM   1. Major depressive disorder, recurrent episode, moderate ([place])  F33.1       Past Psychiatric History: none  Past Medical History:  Past Medical History:  Diagnosis Date   Ankle fracture, left 02/19/2015   from a fall   Anxiety    Cataract 2014   corrected with surgery   CHF (congestive heart failure) (HCC)    CKD (chronic kidney disease) stage 3, GFR 30-59 ml/min (HCC)    saw nephrologist Dr Harden Mo   DDD (degenerative disc disease), cervical    Depression    Fibromyalgia    Glaucoma    s/p surgery, sees ophtho Q6 mo   History of DVT (deep vein thrombosis) several times latest 2012   receives coumadin while hospitalized   History of kidney stones 2010   History of pulmonary embolism 2001, 2006   completed coumadin courses   History of rheumatic fever x3   HLD (hyperlipidemia)    HTN (hypertension)    Insomnia    Lung nodule 09/22/2012   RLL - 60mm, stable since 2014. Thought benign.    Osteoarthritis    shoulders and knees, not RA per Dr Estanislado Pandy, positive ANA, positive Ro   Osteopenia 09/18/2015   DEXA T -1.1 hip, -0.2 spine 08/2015    Personal history of urinary calculi latest 2014  Pneumonia 12/02/2011   PONV (postoperative nausea and vomiting)    Refusal of blood transfusions as patient is Jehovah's Witness    Rheumatic heart disease 1980   s/p mitral valve repair 1980   Sjogren's syndrome ([place])    Trimalleolar fracture of left ankle 02/23/2015    Past Surgical History:  Procedure Laterality Date   BREAST BIOPSY Right 2006   benign   CARDIOVERSION N/A 07/18/2020   Procedure: CARDIOVERSION;  Surgeon: Elouise Munroe, MD;  Location: [facility] ENDOSCOPY;  Service: Cardiovascular;  Laterality: N/A;   CHOLECYSTECTOMY  11/27/2011   Procedure: LAPAROSCOPIC CHOLECYSTECTOMY WITH INTRAOPERATIVE CHOLANGIOGRAM;  Surgeon: Adin Hector, MD;   Location: [place];  Service: General;  Laterality: N/A;  laparoscopic cholecystectomy with choleangiogram umbilical hernia repair   COLONOSCOPY  07/2014   WNL Amedeo Plenty)   COLONOSCOPY  08/2019   3 TAs, rpt 3 yrs ([place])   dexa  08/2012   normal per patient - no records available   ESOPHAGOGASTRODUODENOSCOPY  08/2019   reactive gastropathy, neg H pylori ([place])   EYE SURGERY Bilateral 2014   cataract removal   MITRAL VALVE REPAIR  1980   open heart   ORIF ANKLE FRACTURE Left 02/26/2015   Procedure: OPEN REDUCTION INTERNAL FIXATION (ORIF) LEFT TRIMALLEOLAR ANKLE FRACTURE;  Surgeon: [name], MD;  Location: [place];  Service: Orthopedics;  Laterality: Left;   TUBAL LIGATION  6301   UMBILICAL HERNIA REPAIR  11/27/2011   Procedure: HERNIA REPAIR UMBILICAL ADULT;  Surgeon: Adin Hector, MD;  Location: [place];  Service: General;  Laterality: N/A;   [place]   for fibroids -- partial, ovaries remain    Family Psychiatric History: see below  Family History:  Family History  Problem Relation Age of Onset   Cancer Mother        lung (nonsmoker)   CAD Mother        MI in her 46s   ALS Mother    Kidney disease Father    Alcohol abuse Father    Diabetes Father    Lupus Sister        and niece   Diabetes Sister    Stroke Sister    Depression Sister    Lupus Sister    Blindness Sister    Cancer Maternal Uncle        bone   Stroke Maternal Grandmother    Cancer Brother        bone   Diabetes Brother    Heart attack Brother    Healthy Son    Healthy Son    Healthy Son    Healthy Son    Kidney failure Other        on HD   Breast cancer Neg Hx     Social History:  Social History   Socioeconomic History   Marital status: Married    Spouse name: [name]   Number of children: 4   Years of education: 12   Highest education level: Not on file  Occupational History    Employer: UNEMPLOYED    Comment: Disability  Tobacco Use   Smoking status: Never    Smokeless tobacco: Never  Vaping Use   Vaping Use: Never used  Substance and Sexual Activity   Alcohol use: No    Alcohol/week: 0.0 standard drinks   Drug use: No   Sexual activity: Not Currently    Birth control/protection: Surgical  Other Topics Concern   Not on file  Social History Narrative   Lives with son, 1 dog   Occupation: unemployed, on disability for fibromyalgia since 2008.   Edu: HS   Religion: Jehova's witness   Activity: volunteers at senior center   Diet: some water, fruits/vegetables daily   No caffeine use   Social Determinants of Health   Financial Resource Strain: Low Risk    Difficulty of Paying Living Expenses: Not hard at all  Food Insecurity: No Food Insecurity   Worried About Charity fundraiser in the Last Year: Never true   [place] in the Last Year: Never true  Transportation Needs: No Transportation Needs   Lack of Transportation (Medical): No   Lack of Transportation (Non-Medical): No  Physical Activity: Sufficiently Active   Days of Exercise per Week: 7 days   Minutes of Exercise per Session: 50 min  Stress: No Stress Concern Present   Feeling of Stress : Only a little  Social Connections: Moderately Integrated   Frequency of Communication with Friends and Family: More than three times a week   Frequency of Social Gatherings with Friends and Family: Never   Attends Religious Services: More than 4 times per year   Active Member of [employer] or Organizations: No   Attends Archivist Meetings: Never   Marital Status: Married    Allergies:  Allergies  Allergen Reactions   Cymbalta [Duloxetine Hcl] Other (See Comments)    tachycardia   Statins Nausea Only and Other (See Comments)    Muscle cramps also   Sulfa Antibiotics Nausea And Vomiting    Metabolic Disorder Labs: Lab Results  Component Value Date   HGBA1C 6.0 (H) 01/12/2021   MPG 126 01/12/2021   MPG 114 10/13/2015   No results found for: PROLACTIN Lab  Results  Component Value Date   CHOL 193 01/12/2021   TRIG 93 01/12/2021   HDL 70 01/12/2021   CHOLHDL 2.8 01/12/2021   VLDL 14.4 12/23/2019   LDLCALC 104 (H) 01/12/2021   LDLCALC 82 05/29/2020   Lab Results  Component Value Date   TSH 1.49 01/12/2021   TSH 4.12 12/23/2019    Therapeutic Level Labs: No results found for: LITHIUM No results found for: VALPROATE No components found for:  CBMZ  Current Medications: Current Outpatient Medications  Medication Sig Dispense Refill   temazepam (RESTORIL) 30 MG capsule Take 1 capsule (30 mg total) by mouth at bedtime as needed for sleep. 30 capsule 2   ALPRAZolam (XANAX) 0.5 MG tablet Take 1 tablet (0.5 mg total) by mouth 3 (three) times daily as needed for anxiety. 270 tablet 2   amoxicillin (AMOXIL) 500 MG tablet Take 4 tablets (2000mg ) by mouth 30-60 min prior to dental procedure. 4 tablet 5   antiseptic oral rinse (BIOTENE) LIQD 15 mLs by Mouth Rinse route 2 (two) times daily as needed for dry mouth.     cholecalciferol (VITAMIN D) 1000 UNITS tablet Take 1,000 Units by mouth daily.     co-enzyme Q-10 30 MG capsule Take 30 mg by mouth daily.     diclofenac sodium (VOLTAREN) 1 % GEL Apply 1 application topically 2 (two) times daily as needed (pain).     diltiazem (CARDIZEM CD) 360 MG 24 hr capsule TAKE 1 CAPSULE BY MOUTH  DAILY 90 capsule 3   docusate sodium (COLACE) 100 MG capsule Take 100 mg by mouth daily.     dofetilide (TIKOSYN) 250 MCG capsule TAKE 1 CAPSULE BY MOUTH  TWICE DAILY 180 capsule  3   ELIQUIS 5 MG TABS tablet TAKE 1 TABLET BY MOUTH  TWICE DAILY 180 tablet 3   ezetimibe (ZETIA) 10 MG tablet TAKE 1 TABLET BY MOUTH AT  BEDTIME 90 tablet 3   furosemide (LASIX) 20 MG tablet TAKE 1 TABLET BY MOUTH  DAILY AS NEEDED FOR EDEMA 90 tablet 3   gabapentin (NEURONTIN) 300 MG capsule TAKE 2 CAPSULES BY MOUTH IN THE MORNING , 1 CAPSULE IN  THE AFTERNOON AND 2  CAPSULES AT BEDTIME 450 capsule 3   losartan (COZAAR) 50 MG tablet Take 1  tablet (50 mg total) by mouth daily. 90 tablet 3   lovastatin (MEVACOR) 10 MG tablet TAKE 1 TABLET BY MOUTH  EVERY MONDAY, WEDNESDAY,  AND FRIDAY 39 tablet 3   metoprolol tartrate (LOPRESSOR) 50 MG tablet Take 1.5 tablets (75 mg total) by mouth 2 (two) times daily. 270 tablet 3   pantoprazole (PROTONIX) 40 MG tablet Take 40 mg by mouth daily.     Polyethyl Glycol-Propyl Glycol (SYSTANE) 0.4-0.3 % GEL ophthalmic gel Place 1 drop into both eyes 2 (two) times daily.      polyethylene glycol powder (GLYCOLAX/MIRALAX) 17 GM/SCOOP powder Take 8.5-17 g by mouth daily as needed for moderate constipation. (1/2-1 capful daily as needed) 3350 g 1   potassium chloride (KLOR-CON) 10 MEQ tablet Take 1 tablet (10 mEq total) by mouth daily. 90 tablet 3   RESTASIS 0.05 % ophthalmic emulsion INSTILL ONE DROP IN BOTH  EYES TWO TIMES DAILY 180 each 0   thiamine (VITAMIN B-1) 50 MG tablet Take 1 tablet (50 mg total) by mouth daily.     traMADol (ULTRAM) 50 MG tablet Take 1 tablet (50 mg total) by mouth daily as needed. 30 tablet 0   vitamin B-12 (CYANOCOBALAMIN) 1000 MCG tablet Take 1 tablet (1,000 mcg total) by mouth every Monday, Wednesday, and Friday.     No current facility-administered medications for this visit.     Musculoskeletal: Strength & Muscle Tone: na Gait & Station: na Patient leans: na  Psychiatric Specialty Exam: Review of Systems  Constitutional:  Positive for fatigue.  Musculoskeletal:  Positive for arthralgias.  Psychiatric/Behavioral:  Positive for sleep disturbance.   All other systems reviewed and are negative.  There were no vitals taken for this visit.There is no height or weight on file to calculate BMI.  General Appearance: NA  Eye Contact:  NA  Speech:  Clear and Coherent  Volume:  Normal  Mood:  Euthymic  Affect:  NA  Thought Process:  Goal Directed  Orientation:  Full (Time, Place, and Person)  Thought Content: WDL   Suicidal Thoughts:  No  Homicidal Thoughts:  No   Memory:  Immediate;   Good Recent;   Good Remote;   Good  Judgement:  Good  Insight:  Good  Psychomotor Activity:  Decreased  Concentration:  Concentration: Good and Attention Span: Good  Recall:  Good  Fund of Knowledge: Good  Language: Good  Akathisia:  No  Handed:  Right  AIMS (if indicated): not done  Assets:  Communication Skills Desire for Improvement Resilience Social Support  ADL's:  Intact  Cognition: WNL  Sleep:  Poor   Screenings: GAD-7    Flowsheet Row Office Visit from 12/28/2019 in [place] at [facility] Visit from 12/22/2018 in [place] at [facility]  Total GAD-7 Score 0 [place] from 12/11/2017 in [place] at  [place] from 12/09/2016 in [place] [facility] [place] from 11/30/2015 in [place] at [facility]  Total Score (max 30 points ) 20 19 18       PHQ2-9    Flowsheet Row Video Visit from 03/30/2021 in [place] Video Visit from 02/23/2021 in [place] Video Visit from 12/05/2020 in [place] Video Visit from 08/18/2020 in [place] Video Visit from 05/24/2020 in [place] [facility]  PHQ-2 Total Score 1 0 0 0 0      Flowsheet Row Video Visit from 03/30/2021 in [place] Video Visit from 02/23/2021 in [place] [facility] from 01/25/2021 in [facility] at [place] No Risk No Risk No Risk        Assessment and Plan: This patient is a 71 year old female with a history of depression and anxiety.  She is doing well except for the sleep difficulty.  We will increase Restoril to 30 mg at bedtime for  sleep.  She will continue Xanax 0.5 mg twice daily as needed for anxiety with an additional dose if she wakes up in the middle the night.  She will return to see me in 2 months or call sooner if the medication does not help   [name], MD 03/30/2021, 9:16 AM

## 2021-04-09 ENCOUNTER — Telehealth: Payer: Self-pay

## 2021-04-09 NOTE — Progress Notes (Signed)
Chronic Care Management Pharmacy Assistant   Name: Alexandria Leach  MRN: [id number] DOB: 10-17-1950  Reason for Encounter: CCM (Appointment Reminder)   Medications: Outpatient Encounter Medications as of 04/09/2021  Medication Sig   ALPRAZolam (XANAX) 0.5 MG tablet Take 1 tablet (0.5 mg total) by mouth 3 (three) times daily as needed for anxiety.   amoxicillin (AMOXIL) 500 MG tablet Take 4 tablets (2000mg ) by mouth 30-60 min prior to dental procedure.   antiseptic oral rinse (BIOTENE) LIQD 15 mLs by Mouth Rinse route 2 (two) times daily as needed for dry mouth.   cholecalciferol (VITAMIN D) 1000 UNITS tablet Take 1,000 Units by mouth daily.   co-enzyme Q-10 30 MG capsule Take 30 mg by mouth daily.   diclofenac sodium (VOLTAREN) 1 % GEL Apply 1 application topically 2 (two) times daily as needed (pain).   diltiazem (CARDIZEM CD) 360 MG 24 hr capsule TAKE 1 CAPSULE BY MOUTH  DAILY   docusate sodium (COLACE) 100 MG capsule Take 100 mg by mouth daily.   dofetilide (TIKOSYN) 250 MCG capsule TAKE 1 CAPSULE BY MOUTH  TWICE DAILY   ELIQUIS 5 MG TABS tablet TAKE 1 TABLET BY MOUTH  TWICE DAILY   ezetimibe (ZETIA) 10 MG tablet TAKE 1 TABLET BY MOUTH AT  BEDTIME   furosemide (LASIX) 20 MG tablet TAKE 1 TABLET BY MOUTH  DAILY AS NEEDED FOR EDEMA   gabapentin (NEURONTIN) 300 MG capsule TAKE 2 CAPSULES BY MOUTH IN THE MORNING , 1 CAPSULE IN  THE AFTERNOON AND 2  CAPSULES AT BEDTIME   losartan (COZAAR) 50 MG tablet TAKE 1 TABLET BY MOUTH  DAILY   lovastatin (MEVACOR) 10 MG tablet TAKE 1 TABLET BY MOUTH  EVERY MONDAY, WEDNESDAY,  AND FRIDAY   metoprolol tartrate (LOPRESSOR) 50 MG tablet Take 1.5 tablets (75 mg total) by mouth 2 (two) times daily.   pantoprazole (PROTONIX) 40 MG tablet Take 40 mg by mouth daily.   Polyethyl Glycol-Propyl Glycol (SYSTANE) 0.4-0.3 % GEL ophthalmic gel Place 1 drop into both eyes 2 (two) times daily.    polyethylene glycol powder (GLYCOLAX/MIRALAX) 17 GM/SCOOP powder  Take 8.5-17 g by mouth daily as needed for moderate constipation. (1/2-1 capful daily as needed)   potassium chloride (KLOR-CON) 10 MEQ tablet Take 1 tablet (10 mEq total) by mouth daily.   RESTASIS 0.05 % ophthalmic emulsion INSTILL ONE DROP IN BOTH  EYES TWO TIMES DAILY   temazepam (RESTORIL) 30 MG capsule Take 1 capsule (30 mg total) by mouth at bedtime as needed for sleep.   thiamine (VITAMIN B-1) 50 MG tablet Take 1 tablet (50 mg total) by mouth daily.   traMADol (ULTRAM) 50 MG tablet Take 1 tablet (50 mg total) by mouth daily as needed.   vitamin B-12 (CYANOCOBALAMIN) 1000 MCG tablet Take 1 tablet (1,000 mcg total) by mouth every Monday, Wednesday, and Friday.   No facility-administered encounter medications on file as of 04/09/2021.   Alexandria Leach was contacted to remind of upcoming telephone visit with Alexandria Leach on 04/16/2021 at 11:00. Patient was reminded to have all medications, supplements and any blood glucose and blood pressure readings available for review at appointment.   Are you having any problems with your medications? No   Do you have any concerns you like to discuss with the pharmacist? No  Star Rating Drugs: Medication:  Last Fill: Day Supply Losartan 50 mg 02/02/2021 90  Lovastatin 10 mg 02/27/2021 [place], CPP notified  Alexandria Leach  Alexandria Leach, [place] Assistant [phone]  Time Spent: 10 Minutes

## 2021-04-14 ENCOUNTER — Other Ambulatory Visit: Payer: Self-pay | Admitting: Cardiology

## 2021-04-16 ENCOUNTER — Other Ambulatory Visit: Payer: Self-pay

## 2021-04-16 ENCOUNTER — Ambulatory Visit (INDEPENDENT_AMBULATORY_CARE_PROVIDER_SITE_OTHER): Payer: Medicare Other

## 2021-04-16 DIAGNOSIS — E7800 Pure hypercholesterolemia, unspecified: Secondary | ICD-10-CM

## 2021-04-16 DIAGNOSIS — R7303 Prediabetes: Secondary | ICD-10-CM

## 2021-04-16 DIAGNOSIS — I5032 Chronic diastolic (congestive) heart failure: Secondary | ICD-10-CM

## 2021-04-16 NOTE — Progress Notes (Signed)
Chronic Care Management Pharmacy Note  04/16/2021 Name:  Alexandria Leach MRN:  [id number] DOB:  [birth year]  Summary: CCM 12 month follow up visit. Reviewed HLD, pre-diabetes, and HF with preserved EF. Pt is doing well. Denies any barriers to adherence. No SOB or swelling. Last A1c 6%. She walks 1 mile daily and actively losing weight by limiting sweets. HLD, above goal of < 70 (LDL 104) on Zetia and lovastatin 10 mg three days per week. No gaps in adherence data. Unable to tolerate higher dose statins per chart. May revisit statin history in future. Did not discuss today.  Recommendations/Changes made from today's visit: None   Plan: - CCM PharmD follow up 12 months - [place] CHF review in 6 months  Subjective: Alexandria Leach is an 71 y.o. year old female who is a primary patient of Ria Bush, MD.  The CCM team was consulted for assistance with disease management and care coordination needs.    Engaged with patient by telephone for follow up visit in response to provider referral for pharmacy case management and/or care coordination services.   Consent to Services:  The patient was given information about Chronic Care Management services, agreed to services, and gave verbal consent prior to initiation of services.  Please see initial visit note for detailed documentation.   Patient Care Team: Ria Bush, MD as PCP - General (Family Medicine) Stanford Breed [name], MD as PCP - Cardiology (Cardiology) Evans Lance, MD as PCP - Electrophysiology (Cardiology) Stanford Breed [name], MD as Consulting Physician (Cardiology) Bo Merino, MD as Consulting Physician (Rheumatology) Cloria Spring, MD as Consulting Physician ([place]) Sharyne Peach, MD as Consulting Physician (Ophthalmology) Penni Bombard, MD as Consulting Physician (Neurology) Debbora Dus, [facility] as Pharmacist (Pharmacist)  Recent office visits: 01/12/2021 -  Ria Bush, MD - Patient presented for Annual Wellness Visit. Labs: CMP, A1c, Lipid, Vit B, Folate, Parathyroid Hormone, T4, TSH and Vit B12. Recommend starting B1 replacement (thiamine) at 1m daily for at least a month. You can find this over the counter Follow up 6 months.  Recent consult visits: 03/30/21 - DLevonne Spiller MD [place] 4 week follow up for insomnia. Increase Restoril to 30 mg at bedtime. Continue Xanax 0.5 mg BID PRN anxiety and additional dose PRN sleep (middle of night). Follow up 2 months. 03/15/21 - CMalka So PA, Cardiology - Pt presented for atypical A-flutter. Continue current medications. Follow up 6 months for AFIB clinic.   Hospital visits: None in previous 6 months   Objective:  Lab Results  Component Value Date   CREATININE 1.38 (H) 03/15/2021   BUN 12 03/15/2021   GFR 39.48 (L) 07/11/2020   EGFR 43 (L) 11/27/2020   GFRNONAA 42 (L) 03/15/2021   GFRAA 51 (L) 11/11/2019   NA 139 03/15/2021   K 3.5 03/15/2021   CALCIUM 9.3 03/15/2021   CO2 20 (L) 03/15/2021   GLUCOSE 144 (H) 03/15/2021    Lab Results  Component Value Date/Time   HGBA1C 6.0 (H) 01/12/2021 03:02 PM   HGBA1C 6.5 07/11/2020 12:29 PM   GFR 39.48 (L) 07/11/2020 12:29 PM   GFR 43.09 (L) 12/23/2019 07:57 AM   MICROALBUR 4.8 (H) 12/23/2019 07:57 AM   MICROALBUR <0.7 12/15/2018 08:01 AM    Lab Results  Component Value Date   CHOL 193 01/12/2021   HDL 70 01/12/2021   LDLCALC 104 (H) 01/12/2021   LDLDIRECT 77.0 12/09/2016   TRIG 93  01/12/2021   CHOLHDL 2.8 01/12/2021    Hepatic Function Latest Ref Rng & Units 01/12/2021 12/11/2020 07/11/2020  Total Protein 6.1 - 8.1 g/dL 7.4 7.7 -  Albumin 3.5 - 5.2 g/dL - - 4.0  AST 10 - 35 U/L 20 - -  ALT 6 - 29 U/L 15 - -  Alk Phosphatase 44 - 121 IU/L - - -  Total Bilirubin 0.2 - 1.2 mg/dL 0.5 - -  Bilirubin, Direct 0.0 - 0.3 mg/dL - - -    Lab Results  Component Value Date/Time   TSH 1.49 01/12/2021 03:02  PM   TSH 4.12 12/23/2019 07:57 AM   FREET4 1.1 01/12/2021 03:02 PM   FREET4 0.69 12/23/2019 07:57 AM    CBC Latest Ref Rng & Units 12/11/2020 07/18/2020 12/23/2019  WBC 3.8 - 10.8 Thousand/uL 5.1 - 4.5  Hemoglobin 11.7 - 15.5 g/dL 12.6 12.6 12.0  Hematocrit 35.0 - 45.0 % 39.9 37.0 36.9  Platelets 140 - 400 Thousand/uL 278 - 287.0    Lab Results  Component Value Date/Time   VD25OH 42.65 12/23/2019 07:57 AM   VD25OH 41.56 12/15/2018 08:01 AM    Clinical ASCVD: Yes  The 10-year ASCVD risk score (Arnett DK, et al., 2019) is: 12.8%   Values used to calculate the score:     Age: 89 years     Sex: Female     Is Non-Hispanic African American: Yes     Diabetic: No     Tobacco smoker: No     Systolic Blood Pressure: 720 mmHg     Is BP treated: Yes     HDL Cholesterol: 70 mg/dL     Total Cholesterol: 193 mg/dL    Depression screen [facility] 2/9 03/30/2021 02/23/2021 12/05/2020  Decreased Interest 0 0 0  Down, Depressed, Hopeless 1 0 0  PHQ - 2 Score 1 0 0  Altered sleeping - - -  Tired, decreased energy - - -  Change in appetite - - -  Feeling bad or failure about yourself  - - -  Trouble concentrating - - -  Moving slowly or fidgety/restless - - -  Suicidal thoughts - - -  PHQ-9 Score - - -  Difficult doing work/chores - - -  Some recent data might be hidden     Social History   Tobacco Use  Smoking Status Never  Smokeless Tobacco Never   BP Readings from Last 3 Encounters:  03/15/21 (!) 142/74  01/26/21 (!) 150/70  01/25/21 (!) 157/56   Pulse Readings from Last 3 Encounters:  03/15/21 75  01/26/21 70  01/25/21 77   Wt Readings from Last 3 Encounters:  03/15/21 197 lb 9.6 oz (89.6 kg)  02/12/21 198 lb (89.8 kg)  01/26/21 202 lb 9.6 oz (91.9 kg)   BMI Readings from Last 3 Encounters:  03/15/21 37.34 kg/m  02/12/21 37.41 kg/m  01/26/21 38.28 kg/m    Assessment/Interventions: Review of patient past medical history, allergies, medications, health status, including  review of consultants reports, laboratory and other test data, was performed as part of comprehensive evaluation and provision of chronic care management services.   SDOH:  (Social Determinants of Health) assessments and interventions performed: Yes SDOH Interventions    Flowsheet Row Most Recent Value  SDOH Interventions   Financial Strain Interventions Intervention Not Indicated      SDOH Screenings   Alcohol Screen: Low Risk    Last Alcohol Screening Score (AUDIT): 0  Depression (PHQ2-9): Low Risk  Financial Resource Strain: Low Risk   ° Difficulty of Paying Living Expenses: Not very hard  °Food Insecurity: No Food Insecurity  ° Worried About Running Out of Food in the Last Year: Never true  ° Ran Out of Food in the Last Year: Never true  °Housing: Low Risk   ° Last Housing Risk Score: 0  °Physical Activity: Sufficiently Active  ° Days of Exercise per Week: 7 days  ° Minutes of Exercise per Session: 50 min  °Social Connections: Moderately Integrated  ° Frequency of Communication with Friends and Family: More than three times a week  ° Frequency of Social Gatherings with Friends and Family: Never  ° Attends Religious Services: More than 4 times per year  ° Active Member of Clubs or Organizations: No  ° Attends Club or Organization Meetings: Never  ° Marital Status: Married  °Stress: No Stress Concern Present  ° Feeling of Stress : Only a little  °Tobacco Use: Low Risk   ° Smoking Tobacco Use: Never  ° Smokeless Tobacco Use: Never  ° Passive Exposure: Not on file  °Transportation Needs: No Transportation Needs  ° Lack of Transportation (Medical): No  ° Lack of Transportation (Non-Medical): No  ° ° °CCM Care Plan ° °Allergies  °Allergen Reactions  ° Cymbalta [Duloxetine Hcl] Other (See Comments)  °  tachycardia  ° Statins Nausea Only and Other (See Comments)  °  Muscle cramps also  ° Sulfa Antibiotics Nausea And Vomiting  ° ° °Medications Reviewed Today   ° ° Reviewed by Declyn Delsol,  Shantaya Bluestone, RPH (Pharmacist) on 04/16/21 at 1120  Med List Status: <None>  ° °Medication Order Taking? Sig Documenting Provider Last Dose Status Informant  °ALPRAZolam (XANAX) 0.5 MG tablet [id number] Yes Take 1 tablet (0.5 mg total) by mouth 3 (three) times daily as needed for anxiety. Ross, Deborah R, MD Taking Active   °amoxicillin (AMOXIL) 500 MG tablet [id number] Yes Take 4 tablets (2000mg) by mouth 30-60 min prior to dental procedure. Crenshaw, Brian S, MD Taking Active   °antiseptic oral rinse (BIOTENE) LIQD [id number] Yes 15 mLs by Mouth Rinse route 2 (two) times daily as needed for dry mouth. [provider] Taking Active Self  °cholecalciferol (VITAMIN D) 1000 UNITS tablet [id number] Yes Take 1,000 Units by mouth daily. [provider] Taking Active Self  °co-enzyme Q-10 30 MG capsule [id number] Yes Take 30 mg by mouth daily. [provider] Taking Active Self  °diltiazem (CARDIZEM CD) 360 MG 24 hr capsule [id number] Yes TAKE 1 CAPSULE BY MOUTH  DAILY Crenshaw, Brian S, MD Taking Active   °docusate sodium (COLACE) 100 MG capsule [id number] Yes Take 100 mg by mouth daily. [provider] Taking Active Self  °dofetilide (TIKOSYN) 250 MCG capsule [id number] Yes TAKE 1 CAPSULE BY MOUTH  TWICE DAILY Taylor, Gregg W, MD Taking Active   °ELIQUIS 5 MG TABS tablet [id number] Yes TAKE 1 TABLET BY MOUTH  TWICE DAILY Carroll, Donna C, NP Taking Active   °ezetimibe (ZETIA) 10 MG tablet [id number] Yes TAKE 1 TABLET BY MOUTH AT  BEDTIME Gutierrez, Javier, MD Taking Active   °furosemide (LASIX) 20 MG tablet [id number] Yes TAKE 1 TABLET BY MOUTH  DAILY AS NEEDED FOR EDEMA Gutierrez, Javier, MD Taking Active Self  °gabapentin (NEURONTIN) 300 MG capsule [id number] Yes TAKE 2 CAPSULES BY MOUTH IN THE MORNING , 1 CAPSULE IN  THE AFTERNOON AND 2  CAPSULES AT BEDTIME Gutierrez, Javier, MD Taking Active   °losartan (COZAAR) 50 MG tablet [id number] Yes   TAKE 1 TABLET BY MOUTH  DAILY Crenshaw, Brian S, MD Taking  Active   °lovastatin (MEVACOR) 10 MG tablet [id number] Yes TAKE 1 TABLET BY MOUTH  EVERY MONDAY, WEDNESDAY,  AND FRIDAY Gutierrez, Javier, MD Taking Active   °metoprolol tartrate (LOPRESSOR) 50 MG tablet [id number] Yes Take 1.5 tablets (75 mg total) by mouth 2 (two) times daily. Crenshaw, Brian S, MD Taking Active Self  °pantoprazole (PROTONIX) 40 MG tablet [id number] Yes Take 40 mg by mouth daily. [provider] Taking Active Self  °Polyethyl Glycol-Propyl Glycol (SYSTANE) 0.4-0.3 % GEL ophthalmic gel [id number] Yes Place 1 drop into both eyes 2 (two) times daily.  [provider] Taking Active Self  °potassium chloride (KLOR-CON) 10 MEQ tablet [id number] Yes Take 1 tablet (10 mEq total) by mouth daily. Fenton, Clint R, PA Taking Active   °RESTASIS 0.05 % ophthalmic emulsion [id number] Yes INSTILL ONE DROP IN BOTH  EYES TWO TIMES DAILY Gutierrez, Javier, MD Taking Active   °temazepam (RESTORIL) 30 MG capsule [id number] Yes Take 1 capsule (30 mg total) by mouth at bedtime as needed for sleep. Ross, Deborah R, MD Taking Active   °traMADol (ULTRAM) 50 MG tablet [id number] Yes Take 1 tablet (50 mg total) by mouth daily as needed.  °Patient taking differently: Take 50 mg by mouth daily as needed. Rare use, severe pain (10/10)  ° Dale, Taylor M, PA-C Taking Consider Medication Status and Discontinue   °vitamin B-12 (CYANOCOBALAMIN) 1000 MCG tablet [id number] Yes Take 1 tablet (1,000 mcg total) by mouth every Monday, Wednesday, and Friday. Gutierrez, Javier, MD Taking Active   ° °  °  ° °  ° ° °Patient Active Problem List  ° Diagnosis Date Noted  ° Chronic GERD 01/30/2021  ° History of adenomatous polyp of colon 01/30/2021  ° Left sided abdominal pain 01/30/2021  ° Vitamin B1 deficiency 01/18/2021  ° Low serum vitamin B12 01/13/2021  ° Secondary hypercoagulable state (HCC) 10/23/2020  ° Prediabetes 07/11/2020  ° Snoring 06/23/2020  ° Atrial flutter (HCC) 06/23/2020  ° Dysphagia 05/15/2020  ° Secondary  hyperparathyroidism of renal origin (HCC) 12/28/2019  ° Constipation 12/28/2019  ° Sinus tachycardia 05/27/2018  ° Transient alteration of awareness 04/04/2018  ° Abnormal serum thyroid stimulating hormone (TSH) level 01/30/2018  ° 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/22/2017  ° Thyroid nodule 09/16/2017  ° Neck pain on right side 05/14/2017  ° Sialoadenitis of submandibular gland 10/29/2016  ° High risk medication use 06/05/2016  ° Peripheral neuropathy 01/30/2016  ° DNR no code (do not resuscitate) 12/08/2015  ° 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  ° TIA (transient ischemic attack) 10/12/2015  ° Mild mitral regurgitation 10/12/2015  ° Osteopenia 09/18/2015  ° Right arm pain 08/29/2015  ° History of fall   ° Right sided sciatica 02/21/2015  ° Imbalance 01/12/2015  ° Transaminitis 12/24/2014  ° DDD (degenerative disc disease), cervical   ° Health maintenance examination 10/18/2014  ° Advanced care planning/counseling discussion 10/18/2014  ° Medicare annual wellness visit, subsequent 10/18/2014  ° Refusal of blood transfusions as patient is Jehovah's Witness   ° Sjogren's syndrome (HCC)   ° CKD (chronic kidney disease) stage 3, GFR 30-59 ml/min (HCC)   ° Glaucoma   ° Fibromyalgia   ° Osteoarthritis   ° History of pulmonary embolism   ° Lung nodule 09/22/2012  ° (HFpEF) heart failure with preserved ejection fraction (HCC) 12/04/2011  ° Aortic stenosis 04/22/2011  ° Palpitations 08/10/2010  ° HOT FLASHES 06/28/2008  ° ARTHRALGIA 07/01/2007  °   ARTHRALGIA 07/01/2007   BACK PAIN, LEFT 08/25/2006   GAD (generalized anxiety disorder) 03/28/2006   History of mitral valve repair 03/28/2006   History of total abdominal hysterectomy 03/28/2006   HYPERCHOLESTEROLEMIA 12/31/2005   MDD (major depressive disorder), recurrent episode ([place]) 12/31/2005   RHEUMATIC HEART DISEASE 12/31/2005   Essential hypertension 12/31/2005   Chronic insomnia 12/31/2005    Immunization History  Administered Date(s) Administered    PFIZER(Purple Top)SARS-COV-2 Vaccination 05/12/2019, 06/09/2019, 01/06/2020   Pneumococcal Conjugate-13 12/15/2017   Pneumococcal Polysaccharide-23 09/15/2012, 12/22/2018    Conditions to be addressed/monitored:  Hypertension, Heart Failure, and Pre-diabetes  Care Plan : [place]  Updates made by Debbora Dus, [place] since 04/18/2021 12:00 AM     Problem: CHL AMB "PATIENT-SPECIFIC PROBLEM"      Long-Range Goal: Patient Stated   Start Date: 04/16/2021  Priority: High  Note:   Current Barriers:  None identified  Pharmacist Clinical Goal(s):  Patient will contact provider office for questions/concerns as evidenced notation of same in electronic health record through collaboration with PharmD and provider.   Interventions: 1:1 collaboration with Ria Bush, MD regarding development and update of comprehensive plan of care as evidenced by provider attestation and co-signature Inter-disciplinary care team collaboration (see longitudinal plan of care) Comprehensive medication review performed; medication list updated in electronic medical record  Hyperlipidemia: (LDL goal < 70) -Not ideally controlled, on max tolerated statin and Zetia -Current treatment: Ezetimibe 10 mg - 1 tablet daily at bedtime Lovastatin 10 mg - 1 tablet M,W,F -Medications previously tried: multiple statins  -Educated on Cholesterol goals;  Exercise goal of 150 minutes per week; -Recommended to continue current medication  Pre-Diabetes (A1c goal <6.5%) -Controlled, A1c 6% (12/2020) -Current medications: None -Medications previously tried: none  -Current home glucose readings - none, pt does not routinely monitor -Denies hypoglycemic/hyperglycemic symptoms -Current meal patterns:  reports she is down to 196 lbs from 205 lbs due to cutting back on sweets lately -Current exercise habits: walking daily (at least 1 mile) -Educated on A1c and blood sugar goals; Congratulated on improved  A1c. -Counseled to check feet daily and get yearly eye exams -Recommended continue lifestyle management  Heart Failure (Goal: manage symptoms and prevent exacerbations) -Controlled, followed by cardiology. Pt reports symptoms stable. -Last ejection fraction: 65-70% (Date: 05/2017) -HF type: Diastolic -NYHA Class: II (slight limitation of activity) -AHA HF Stage: C (Heart disease and symptoms present) -Current treatment: Lasix 20 mg - 1 tablet daily PRN edema Metoprolol tartrate 50 mg - Take 75 mg BID  Potassium 20 mEq - 1 daily  -Medications previously tried: none reported  -Current home BP/HR readings: checks every other day; ranging 126/70- 138/70 -Current dietary habits: limits table salt -She checks weight daily - average 196 lbs.  She denies fluctuations in weight or swelling. She reports some swelling about once/month if she eats salty foods. Takes Lasix 1 tablet PRN for this. Confirms potassium daily due to hypokalemia -Educated on Importance of weighing daily; if you gain more than 3 pounds in one day or 5 pounds in one week, take Lasix. -Recommended to continue current medication  Patient Goals/Self-Care Activities Patient will:  - take medications as prescribed as evidenced by patient report and record review weigh daily, and contact provider if weight gain of 3 lbs in 24 hours or 5 lbs in 1 week  Follow Up Plan: Telephone follow up appointment with care management team member scheduled for: - CCM PharmD follow up 12 months - [place]  months °  ° °Medication Assistance: None required.  Patient affirms current coverage meets needs. ° °Compliance/Adherence/Medication fill history: °Care Gaps: °None ° °Star-Rating Drugs: °Medication:                Last Fill:         Day Supply °Losartan 50 mg           02/02/2021      90         °Lovastatin 10 mg        02/27/2021      90 ° °No gaps in adherence ° °Patient's preferred pharmacy is: ° °[id number] - [place], [place] - [address] AT [address] °[address] °[place] [address] °Phone: [phone] Fax: [phone] ° °[facility] - [place], [place] - [address] °[address] °[address] °[place] [address] °Phone: [phone] Fax: [phone] ° °Acute meds - [facility] °Maintenance meds - Mail order °Uses pill box? Yes - 2 week pill box °Pt endorses very good compliance  °No co-payments for medications ° °Care Plan and Follow Up Patient Decision:  Patient agrees to Care Plan and Follow-up. ° °Michelle Adams, PharmD °Clinical Pharmacist Practitioner °[facility] °[phone] ° ° ° ° ° °

## 2021-04-17 DIAGNOSIS — E7800 Pure hypercholesterolemia, unspecified: Secondary | ICD-10-CM

## 2021-04-17 DIAGNOSIS — I5032 Chronic diastolic (congestive) heart failure: Secondary | ICD-10-CM | POA: Diagnosis not present

## 2021-04-18 NOTE — Patient Instructions (Signed)
Dear Alexandria Leach,  Below is a summary of the goals we discussed during our follow up appointment on April 16, 2021. Please contact me anytime with questions or concerns.   Visit Information  Patient Care Plan: CCM Pharmacy Care Plan     Problem Identified: CHL AMB "PATIENT-SPECIFIC PROBLEM"      Long-Range Goal: Patient Stated   Start Date: 04/16/2021  Priority: High  Note:   Current Barriers:  None identified  Pharmacist Clinical Goal(s):  Patient will contact provider office for questions/concerns as evidenced notation of same in electronic health record through collaboration with PharmD and provider.   Interventions: 1:1 collaboration with Ria Bush, MD regarding development and update of comprehensive plan of care as evidenced by provider attestation and co-signature Inter-disciplinary care team collaboration (see longitudinal plan of care) Comprehensive medication review performed; medication list updated in electronic medical record  Hyperlipidemia: (LDL goal < 70) -Not ideally controlled, on max tolerated statin and Zetia -Current treatment: Ezetimibe 10 mg - 1 tablet daily at bedtime Lovastatin 10 mg - 1 tablet M,W,F -Medications previously tried: multiple statins  -Educated on Cholesterol goals;  Exercise goal of 150 minutes per week; -Recommended to continue current medication  Pre-Diabetes (A1c goal <6.5%) -Controlled, A1c 6% (12/2020) -Current medications: None -Medications previously tried: none  -Current home glucose readings - none, pt does not routinely monitor -Denies hypoglycemic/hyperglycemic symptoms -Current meal patterns:  reports she is down to 196 lbs from 205 lbs due to cutting back on sweets lately -Current exercise habits: walking daily (at least 1 mile) -Educated on A1c and blood sugar goals; Congratulated on improved A1c. -Counseled to check feet daily and get yearly eye exams -Recommended continue lifestyle  management  Heart Failure (Goal: manage symptoms and prevent exacerbations) -Controlled, followed by cardiology. Pt reports symptoms stable. -Last ejection fraction: 65-70% (Date: 05/2017) -HF type: Diastolic -NYHA Class: II (slight limitation of activity) -AHA HF Stage: C (Heart disease and symptoms present) -Current treatment: Lasix 20 mg - 1 tablet daily PRN edema Metoprolol tartrate 50 mg - Take 75 mg BID  Potassium 20 mEq - 1 daily  -Medications previously tried: none reported  -Current home BP/HR readings: checks every other day; ranging 126/70- 138/70 -Current dietary habits: limits table salt -She checks weight daily - average 196 lbs.  She denies fluctuations in weight or swelling. She reports some swelling about once/month if she eats salty foods. Takes Lasix 1 tablet PRN for this. Confirms potassium daily due to hypokalemia -Educated on Importance of weighing daily; if you gain more than 3 pounds in one day or 5 pounds in one week, take Lasix. -Recommended to continue current medication  Patient Goals/Self-Care Activities Patient will:  - take medications as prescribed as evidenced by patient report and record review weigh daily, and contact provider if weight gain of 3 lbs in 24 hours or 5 lbs in 1 week  Follow Up Plan: Telephone follow up appointment with care management team member scheduled for: - CCM PharmD follow up 12 months - [place] CHF review in 6 months      Patient verbalizes understanding of instructions and care plan provided today and agrees to view in [place]. Active MyChart status confirmed with patient.    Debbora Dus, PharmD Clinical Pharmacist Practitioner [facility] [phone]

## 2021-04-20 DIAGNOSIS — H16221 Keratoconjunctivitis sicca, not specified as Sjogren's, right eye: Secondary | ICD-10-CM | POA: Diagnosis not present

## 2021-04-20 DIAGNOSIS — H16222 Keratoconjunctivitis sicca, not specified as Sjogren's, left eye: Secondary | ICD-10-CM | POA: Diagnosis not present

## 2021-04-26 DIAGNOSIS — H2513 Age-related nuclear cataract, bilateral: Secondary | ICD-10-CM | POA: Diagnosis not present

## 2021-04-26 DIAGNOSIS — H16102 Unspecified superficial keratitis, left eye: Secondary | ICD-10-CM | POA: Diagnosis not present

## 2021-04-26 DIAGNOSIS — H1820 Unspecified corneal edema: Secondary | ICD-10-CM | POA: Diagnosis not present

## 2021-04-26 DIAGNOSIS — H16221 Keratoconjunctivitis sicca, not specified as Sjogren's, right eye: Secondary | ICD-10-CM | POA: Diagnosis not present

## 2021-04-30 NOTE — Progress Notes (Signed)
Office Visit Note  Patient: Alexandria Leach             Date of Birth: 10/24/1950           MRN: [id number]             PCP: Ria Bush, MD Referring: Ria Bush, MD Visit Date: 05/14/2021 Occupation: _0 @  Subjective:  Dry eyes   History of Present Illness: Navaeh Kehres is a 71 y.o. female with history of sjogren's syndrome, fibromyalgia, osteoarthritis, and DDD.  She is not currently taking any immunosuppressive agents.  She presents today with increased sicca symptoms.  She has had severe eye dryness and has had to follow-up closely with her ophthalmologist.  She has been using Systane and Restasis eyedrops and has started using ointment at night.  She is also had some increased mouth dryness and continues to use Biotene products as well as lotions throughout the day for symptomatic relief.  She denies any parotid swelling or tenderness.  She has noticed 1 swollen lymph node and was evaluated by her PCP and has a follow-up visit scheduled next month for reevaluation.  She has not had any shortness of breath or pleuritic chest pain.  She denies any symptoms of neuropathy.  She previously tried Plaquenil but did not notice any clinical improvement so she discontinued.  She continues to experience some discomfort in both shoulders and both hips.  She experiences discomfort at night when lying on her sides.  She takes tramadol very sparingly for pain relief.  She denies any joint swelling at this time.     Activities of Daily Living:  Patient reports morning stiffness for 1 hour.   Patient Reports nocturnal pain.  Difficulty dressing/grooming: Reports Difficulty climbing stairs: Reports Difficulty getting out of chair: Denies Difficulty using hands for taps, buttons, cutlery, and/or writing: Denies  Review of Systems  Constitutional:  Positive for fatigue.  HENT:  Positive for mouth dryness and nose dryness. Negative for mouth sores.   Eyes:  Positive  for dryness. Negative for pain and itching.  Respiratory:  Negative for shortness of breath and difficulty breathing.   Cardiovascular:  Negative for chest pain and palpitations.  Gastrointestinal:  Negative for blood in stool, constipation and diarrhea.  Endocrine: Negative for increased urination.  Genitourinary:  Negative for difficulty urinating.  Musculoskeletal:  Positive for joint pain, joint pain, myalgias, morning stiffness, muscle tenderness and myalgias. Negative for joint swelling.  Skin:  Negative for color change, rash and redness.  Allergic/Immunologic: Negative for susceptible to infections.  Neurological:  Positive for dizziness and numbness. Negative for headaches, memory loss and weakness.  Hematological:  Positive for bruising/bleeding tendency.  Psychiatric/Behavioral:  Negative for confusion.    PMFS History:  Patient Active Problem List   Diagnosis Date Noted   Chronic GERD 01/30/2021   History of adenomatous polyp of colon 01/30/2021   Left sided abdominal pain 01/30/2021   Vitamin B1 deficiency 01/18/2021   Low serum vitamin B12 01/13/2021   Secondary hypercoagulable state ([place]) 10/23/2020   Prediabetes 07/11/2020   Snoring 06/23/2020   Atrial flutter ([place]) 06/23/2020   Dysphagia 05/15/2020   Secondary hyperparathyroidism of renal origin ([place]) 12/28/2019   Constipation 12/28/2019   Sinus tachycardia 05/27/2018   Transient alteration of awareness 04/04/2018   Abnormal serum thyroid stimulating hormone (TSH) level 01/30/2018   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[place]) 12/22/2017   Thyroid nodule 09/16/2017   Neck pain on right side 05/14/2017   Sialoadenitis of  submandibular gland 10/29/2016   High risk medication use 06/05/2016   Peripheral neuropathy 01/30/2016   DNR no code (do not resuscitate) 12/08/2015   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   TIA (transient ischemic attack) 10/12/2015   Mild mitral regurgitation 10/12/2015   Osteopenia  09/18/2015   Right arm pain 08/29/2015   History of fall    Right sided sciatica 02/21/2015   Imbalance 01/12/2015   Transaminitis 12/24/2014   DDD (degenerative disc disease), cervical    Health maintenance examination 10/18/2014   Advanced care planning/counseling discussion 10/18/2014   Medicare annual wellness visit, subsequent 10/18/2014   Refusal of blood transfusions as patient is Jehovah's Witness    Sjogren's syndrome ([place])    CKD (chronic kidney disease) stage 3, GFR 30-59 ml/min (HCC)    Glaucoma    Fibromyalgia    Osteoarthritis    History of pulmonary embolism    Lung nodule 09/22/2012   (HFpEF) heart failure with preserved ejection fraction ([place]) 12/04/2011   Aortic stenosis 04/22/2011   Palpitations 08/10/2010   HOT FLASHES 06/28/2008   ARTHRALGIA 07/01/2007   BACK PAIN, LEFT 08/25/2006   GAD (generalized anxiety disorder) 03/28/2006   History of mitral valve repair 03/28/2006   History of total abdominal hysterectomy 03/28/2006   HYPERCHOLESTEROLEMIA 12/31/2005   MDD (major depressive disorder), recurrent episode ([place]) 12/31/2005   RHEUMATIC HEART DISEASE 12/31/2005   Essential hypertension 12/31/2005   Chronic insomnia 12/31/2005    Past Medical History:  Diagnosis Date   Ankle fracture, left 02/19/2015   from a fall   Anxiety    Cataract 2014   corrected with surgery   CHF (congestive heart failure) (HCC)    CKD (chronic kidney disease) stage 3, GFR 30-59 ml/min (HCC)    saw nephrologist Dr Harden Mo   DDD (degenerative disc disease), cervical    Depression    Fibromyalgia    Glaucoma    s/p surgery, sees ophtho Q6 mo   History of DVT (deep vein thrombosis) several times latest 2012   receives coumadin while hospitalized   History of kidney stones 2010   History of pulmonary embolism 2001, 2006   completed coumadin courses   History of rheumatic fever x3   HLD (hyperlipidemia)    HTN (hypertension)    Insomnia    Lung nodule 09/22/2012   RLL -  103m, stable since 2014. Thought benign.    Osteoarthritis    shoulders and knees, not RA per Dr DEstanislado Pandy positive ANA, positive Ro   Osteopenia 09/18/2015   DEXA T -1.1 hip, -0.2 spine 08/2015    Personal history of urinary calculi latest 2014   Pneumonia 12/02/2011   PONV (postoperative nausea and vomiting)    Refusal of blood transfusions as patient is Jehovah's Witness    Rheumatic heart disease 1980   s/p mitral valve repair 1980   Sjogren's syndrome ([place]    Trimalleolar fracture of left ankle 02/23/2015    Family History  Problem Relation Age of Onset   Cancer Mother        lung (nonsmoker)   CAD Mother        MI in her 660s  ALS Mother    Kidney disease Father    Alcohol abuse Father    Diabetes Father    Lupus Sister        and niece   Diabetes Sister    Stroke Sister    Depression Sister    Lupus Sister  Blindness Sister    Cancer Maternal Uncle        bone   Stroke Maternal Grandmother    Cancer Brother        bone   Diabetes Brother    Heart attack Brother    Healthy Son    Healthy Son    Healthy Son    Healthy Son    Kidney failure Other        on HD   Breast cancer Neg Hx    Past Surgical History:  Procedure Laterality Date   BREAST BIOPSY Right 2006   benign   CARDIOVERSION N/A 07/18/2020   Procedure: CARDIOVERSION;  Surgeon: Elouise Munroe, MD;  Location[facility] ENDOSCOPY;  Service: Cardiovascular;  Laterality: N/A;   CHOLECYSTECTOMY  11/27/2011   Procedure: LAPAROSCOPIC CHOLECYSTECTOMY WITH INTRAOPERATIVE CHOLANGIOGRAM;  Surgeon: Adin Hector, MD;  Location: [place];  Service: General;  Laterality: N/A;  laparoscopic cholecystectomy with choleangiogram umbilical hernia repair   COLONOSCOPY  07/2014   WNL Amedeo Plenty)   COLONOSCOPY  08/2019   3 TAs, rpt 3 yrs ([place])   dexa  08/2012   normal per patient - no records available   ESOPHAGOGASTRODUODENOSCOPY  08/2019   reactive gastropathy, neg H pylori ([place])   EYE SURGERY Bilateral 2014    cataract removal   MITRAL VALVE REPAIR  1980   open heart   ORIF ANKLE FRACTURE Left 02/26/2015   Procedure: OPEN REDUCTION INTERNAL FIXATION (ORIF) LEFT TRIMALLEOLAR ANKLE FRACTURE;  Surgeon: [name], MD;  Location: [place];  Service: Orthopedics;  Laterality: Left;   TUBAL LIGATION  5956   UMBILICAL HERNIA REPAIR  11/27/2011   Procedure: HERNIA REPAIR UMBILICAL ADULT;  Surgeon: Adin Hector, MD;  Location: [place];  Service: General;  Laterality: N/A;   [place]   for fibroids -- partial, ovaries remain   Social History   Social History Narrative   Lives with son, 1 dog   Occupation: unemployed, on disability for fibromyalgia since 2008.   Edu: HS   Religion: Jehova's witness   Activity: volunteers at senior center   Diet: some water, fruits/vegetables daily   No caffeine use   Immunization History  Administered Date(s) Administered   PFIZER(Purple Top)SARS-COV-2 Vaccination 05/12/2019, 06/09/2019, 01/06/2020   Pneumococcal Conjugate-13 12/15/2017   Pneumococcal Polysaccharide-23 09/15/2012, 12/22/2018     Objective: Vital Signs: BP (!) 145/84 (BP Location: Left Arm, Patient Position: Sitting, Cuff Size: Normal)    Pulse 71    Ht 5' 1" (1.549 m)    Wt 199 lb 9.6 oz (90.5 kg)    BMI 37.71 kg/m    Physical Exam Vitals and nursing note reviewed.  Constitutional:      Appearance: She is well-developed.  HENT:     Head: Normocephalic and atraumatic.     Mouth/Throat:     Comments: No parotid swelling or tenderness noted. Eyes:     Conjunctiva/sclera: Conjunctivae normal.  Cardiovascular:     Rate and Rhythm: Normal rate and regular rhythm.     Heart sounds: Normal heart sounds.  Pulmonary:     Effort: Pulmonary effort is normal.     Breath sounds: Normal breath sounds.  Abdominal:     General: Bowel sounds are normal.     Palpations: Abdomen is soft.  Musculoskeletal:     Cervical back: Normal range of motion.  Lymphadenopathy:     Cervical:  Cervical adenopathy (1 left submandibular LN palpable, nontender) present.  Skin:  General: Skin is warm and dry.     Capillary Refill: Capillary refill takes less than 2 seconds.  Neurological:     Mental Status: She is alert and oriented to person, place, and time.  Psychiatric:        Behavior: Behavior normal.     Musculoskeletal Exam: C-spine is slightly limited range of motion with lateral rotation.  Painful range of motion above shoulder joints especially the right shoulder.  Elbow joints, wrist joints, MCPs, PIPs, DIPs have good range of motion with no synovitis.  Complete fist formation bilaterally.  PIP and DIP thickening consistent with osteoarthritis of both hands.  Hip joints have severely limited range of motion bilaterally.  Tenderness over both trochanteric bursa noted.  Knee joints have good range of motion with crepitus bilaterally.  No warmth or effusion of knee joints noted.  Ankle joints have good range of motion with no tenderness or joint swelling.  CDAI Exam: CDAI Score: -- Patient Global: --; Provider Global: -- Swollen: --; Tender: -- Joint Exam 05/14/2021   No joint exam has been documented for this visit   There is currently no information documented on the homunculus. Go to the Rheumatology activity and complete the homunculus joint exam.  Investigation: No additional findings.  Imaging: No results found.  Recent Labs: Lab Results  Component Value Date   WBC 5.1 12/11/2020   HGB 12.6 12/11/2020   PLT 278 12/11/2020   NA 139 03/15/2021   K 3.5 03/15/2021   CL 110 03/15/2021   CO2 20 (L) 03/15/2021   GLUCOSE 144 (H) 03/15/2021   BUN 12 03/15/2021   CREATININE 1.38 (H) 03/15/2021   BILITOT 0.5 01/12/2021   ALKPHOS 78 05/29/2020   AST 20 01/12/2021   ALT 15 01/12/2021   PROT 7.4 01/12/2021   ALBUMIN 4.0 07/11/2020   CALCIUM 9.3 03/15/2021   GFRAA 51 (L) 11/11/2019    Speciality Comments: PLQ Eye Exam: 08/03/2018 WNL @ [facility] Follow  up in 1 year  Procedures:  No procedures performed Allergies: Cymbalta [duloxetine hcl], Statins, and Sulfa antibiotics   Assessment / Plan:     Visit Diagnoses: Sjogren's syndrome with keratoconjunctivitis sicca ([place]) - +ANA, +Ro: She presents today with severe sicca symptoms.  She has been using Restasis and Systane eyedrops as well as a new prescription ointment at bedtime prescribed by her ophthalmologist.  She has been following up with her ophthalmologist closely.   According to the patient she had a corneal tear due to the severity of her eye dryness. We will call to obtain these records.  She has also had some increased mouth dryness despite using Biotene products and lozenges throughout the day for symptomatic relief.  She had no parotid swelling or tenderness on examination today.  She is not currently taking any immunosuppressive agents.  She previously tolerated Plaquenil without any side effects but did not notice any clinical benefit and discontinued.  She is not a good candidate to restart on Plaquenil at this time due to being on Tikosyn.  She has no signs of inflammatory arthritis at this time.  No synovitis was noted.  She has not had any increased shortness of breath, coughing, pleuritic chest pain.  Discussed the association of Sjogren's syndrome with ILD.  I also discussed the increased risk of neuropathy and lymphoma in patients with Sjogren's.  1 small nontender 7 irregular lymph node was palpated on examination today.  She has an upcoming appointment with her PCP for further  evaluation.  CBC with diff, SPEP, complements, and ESR were updated today.  The following autoimmune lab work will also be updated today. Overall she has started to notice some some improvement in her eye dryness on the current regimen prescribed by her ophthalmologist.  She was advised to notify us if she develops any new or worsening symptoms. - Plan: CBC with Differential/Platelet, COMPLETE METABOLIC PANEL  WITH GFR, C3 and C4, Sedimentation rate, Urinalysis, Routine w reflex microscopic, ANA, Rheumatoid factor, Serum protein electrophoresis with reflex, Sjogrens syndrome-B extractable nuclear antibody, Sjogrens syndrome-A extractable nuclear antibody  High risk medication use - Discontinued PLQ in Feb 2021.  She is not taking any immunosuppressive agents at this time.  CBC and CMP were updated today.  UDS and narcotic agreement were also updated today.  She has been taking tramadol very sparingly for symptomatic relief.- Plan: DRUG MONITOR, TRAMADOL,QN, URINE, DRUG MONITOR, PANEL 5, W/CONF, URINE, CBC with Differential/Platelet, COMPLETE METABOLIC PANEL WITH GFR  Primary osteoarthritis of right knee - X-rays of the right knee were consistent with moderate osteoarthritis, moderate chondromalacia patella, and chondrocalcinosis on 02/14/20.  She has good range of motion of the right knee joint with crepitus.  No warmth or effusion was noted currently.  DDD (degenerative disc disease), cervical: She has limited ROM of the C-spine.  No symptoms of radiculopathy.   Osteopenia of multiple sites - DEXA 09/14/2015 BMD left FN is 0.891 with T score -1.1.  She is taking vitamin D 1000 units daily.  No recent falls or fractures. DEXA scheduled for 07/16/2021.  Fibromyalgia: She experiences intermittent myalgias and muscle tenderness due to fibromyalgia.  She takes gabapentin as prescribed and tramadol very sparingly for pain relief.  She has been taking Restoril at bedtime to help her sleep at night.  Discussed the importance of regular exercise and good sleep hygiene.  Other chronic pain - Tramadol as needed for pain relief.  She has been taking tramadol sparingly.  UDS and narcotic agreement were updated today on 05/14/2021. - Plan: DRUG MONITOR, TRAMADOL,QN, URINE, DRUG MONITOR, PANEL 5, W/CONF, URINE  Other insomnia: She has been experiencing some increased nocturnal pain especially in her shoulders and hips when  lying on her sides at night.  She takes tramadol very sparingly for pain relief.  Discussed the importance of good sleep hygiene.  She continues to take Restoril at bedtime.  Other fatigue -SPEP ordered today.  Plan: Serum protein electrophoresis with reflex  Other medical conditions are listed as follows:  History of hypercholesterolemia  Typical atrial flutter ([place]) - hospital admission on 10/23/2020 at which time the patient initiated Tikosyn.   History of anxiety  Thyroid nodule - Thyroid ultrasound obtained on 10/27/19.   History of pulmonary embolism  History of hypertension  Stage 3a chronic kidney disease ([place])  Orders: Orders Placed This Encounter  Procedures   DRUG MONITOR, TRAMADOL,QN, URINE   DRUG MONITOR, PANEL 5, W/CONF, URINE   CBC with Differential/Platelet   COMPLETE METABOLIC PANEL WITH GFR   C3 and C4   Sedimentation rate   Urinalysis, Routine w reflex microscopic   ANA   Rheumatoid factor   Serum protein electrophoresis with reflex   Sjogrens syndrome-B extractable nuclear antibody   Sjogrens syndrome-A extractable nuclear antibody   No orders of the defined types were placed in this encounter.    Follow-Up Instructions: Return in about 5 months (around 10/11/2021) for Sjogren's syndrome, Fibromyalgia, Osteoarthritis, DDD.   Ofilia Neas, PA-C  Note - This record  has been created using [employer].  Chart creation errors have been sought, but may not always  have been located. Such creation errors do not reflect on  the standard of medical care.

## 2021-05-04 ENCOUNTER — Encounter: Payer: Self-pay | Admitting: Internal Medicine

## 2021-05-04 MED ORDER — DOFETILIDE 250 MCG PO CAPS: 250.0000 ug | ORAL_CAPSULE | Freq: Two times a day (BID) | ORAL | 0 refills | Status: DC

## 2021-05-14 ENCOUNTER — Encounter: Payer: Self-pay | Admitting: Physician Assistant

## 2021-05-14 ENCOUNTER — Ambulatory Visit (INDEPENDENT_AMBULATORY_CARE_PROVIDER_SITE_OTHER): Payer: Medicare Other | Admitting: Physician Assistant

## 2021-05-14 ENCOUNTER — Other Ambulatory Visit: Payer: Self-pay

## 2021-05-14 ENCOUNTER — Ambulatory Visit: Payer: Medicare Other | Admitting: Rheumatology

## 2021-05-14 VITALS — BP 145/84 | HR 71 | Ht 61.0 in | Wt 199.6 lb

## 2021-05-14 DIAGNOSIS — Z8639 Personal history of other endocrine, nutritional and metabolic disease: Secondary | ICD-10-CM | POA: Diagnosis not present

## 2021-05-14 DIAGNOSIS — Z79899 Other long term (current) drug therapy: Secondary | ICD-10-CM | POA: Diagnosis not present

## 2021-05-14 DIAGNOSIS — M5030 Other cervical disc degeneration, unspecified cervical region: Secondary | ICD-10-CM | POA: Diagnosis not present

## 2021-05-14 DIAGNOSIS — N1831 Chronic kidney disease, stage 3a: Secondary | ICD-10-CM

## 2021-05-14 DIAGNOSIS — G4709 Other insomnia: Secondary | ICD-10-CM

## 2021-05-14 DIAGNOSIS — M3501 Sicca syndrome with keratoconjunctivitis: Secondary | ICD-10-CM

## 2021-05-14 DIAGNOSIS — G8929 Other chronic pain: Secondary | ICD-10-CM | POA: Diagnosis not present

## 2021-05-14 DIAGNOSIS — M8589 Other specified disorders of bone density and structure, multiple sites: Secondary | ICD-10-CM

## 2021-05-14 DIAGNOSIS — Z8679 Personal history of other diseases of the circulatory system: Secondary | ICD-10-CM

## 2021-05-14 DIAGNOSIS — E041 Nontoxic single thyroid nodule: Secondary | ICD-10-CM

## 2021-05-14 DIAGNOSIS — I483 Typical atrial flutter: Secondary | ICD-10-CM | POA: Diagnosis not present

## 2021-05-14 DIAGNOSIS — R5383 Other fatigue: Secondary | ICD-10-CM | POA: Diagnosis not present

## 2021-05-14 DIAGNOSIS — Z8659 Personal history of other mental and behavioral disorders: Secondary | ICD-10-CM

## 2021-05-14 DIAGNOSIS — M797 Fibromyalgia: Secondary | ICD-10-CM

## 2021-05-14 DIAGNOSIS — Z86711 Personal history of pulmonary embolism: Secondary | ICD-10-CM

## 2021-05-14 DIAGNOSIS — M1711 Unilateral primary osteoarthritis, right knee: Secondary | ICD-10-CM

## 2021-05-15 NOTE — Progress Notes (Signed)
CBC stable. Creatinine is elevated-1.33 and GFR is low-44.  Rest of CMP WNL.  Please advise the patient to avoid the use of NSAIDs. UA normal. RF negative. Complements WNL.  ESR is slightly elevated-34.

## 2021-05-15 NOTE — Progress Notes (Signed)
Ro antibody is positive.  La is negative.

## 2021-05-16 LAB — DRUG MONITOR, PANEL 5, W/CONF, URINE
Alphahydroxyalprazolam: 1168 ng/mL — ABNORMAL HIGH (ref <25)
Alphahydroxymidazolam: NEGATIVE ng/mL (ref <50)
Alphahydroxytriazolam: NEGATIVE ng/mL (ref <50)
Aminoclonazepam: NEGATIVE ng/mL (ref <25)
Amphetamines: NEGATIVE ng/mL (ref <500)
Barbiturates: NEGATIVE ng/mL (ref <300)
Benzodiazepines: POSITIVE ng/mL — AB (ref <100)
Cocaine Metabolite: NEGATIVE ng/mL (ref <150)
Creatinine: 220.6 mg/dL (ref >=20.0)
Hydroxyethylflurazepam: NEGATIVE ng/mL (ref <50)
Lorazepam: NEGATIVE ng/mL (ref <50)
Marijuana Metabolite: NEGATIVE ng/mL (ref <20)
Methadone Metabolite: NEGATIVE ng/mL (ref <100)
Nordiazepam: NEGATIVE ng/mL (ref <50)
Opiates: NEGATIVE ng/mL (ref <100)
Oxazepam: NEGATIVE ng/mL (ref <50)
Oxidant: NEGATIVE ug/mL (ref <200)
Oxycodone: NEGATIVE ng/mL (ref <100)
Temazepam: NEGATIVE ng/mL (ref <50)
pH: 6.2 (ref 4.5–9.0)

## 2021-05-16 LAB — CBC WITH DIFFERENTIAL/PLATELET
Absolute Monocytes: 398 cells/uL (ref 200–950)
Basophils Absolute: 38 cells/uL (ref 0–200)
Basophils Relative: 0.8 %
Eosinophils Absolute: 67 cells/uL (ref 15–500)
Eosinophils Relative: 1.4 %
HCT: 38.9 % (ref 35.0–45.0)
Hemoglobin: 12.4 g/dL (ref 11.7–15.5)
Lymphs Abs: 1320 cells/uL (ref 850–3900)
MCH: 27 pg (ref 27.0–33.0)
MCHC: 31.9 g/dL — ABNORMAL LOW (ref 32.0–36.0)
MCV: 84.6 fL (ref 80.0–100.0)
MPV: 10.9 fL (ref 7.5–12.5)
Monocytes Relative: 8.3 %
Neutro Abs: 2976 cells/uL (ref 1500–7800)
Neutrophils Relative %: 62 %
Platelets: 247 10*3/uL (ref 140–400)
RBC: 4.6 10*6/uL (ref 3.80–5.10)
RDW: 14.1 % (ref 11.0–15.0)
Total Lymphocyte: 27.5 %
WBC: 4.8 10*3/uL (ref 3.8–10.8)

## 2021-05-16 LAB — ANA: Anti Nuclear Antibody (ANA): POSITIVE — AB

## 2021-05-16 LAB — COMPLETE METABOLIC PANEL WITH GFR
AG Ratio: 1.3 (calc) (ref 1.0–2.5)
ALT: 19 U/L (ref 6–29)
AST: 21 U/L (ref 10–35)
Albumin: 4.1 g/dL (ref 3.6–5.1)
Alkaline phosphatase (APISO): 64 U/L (ref 37–153)
BUN/Creatinine Ratio: 8 (calc) (ref 6–22)
BUN: 11 mg/dL (ref 7–25)
CO2: 22 mmol/L (ref 20–32)
Calcium: 10.1 mg/dL (ref 8.6–10.4)
Chloride: 108 mmol/L (ref 98–110)
Creat: 1.33 mg/dL — ABNORMAL HIGH (ref 0.50–1.05)
Globulin: 3.1 g/dL (calc) (ref 1.9–3.7)
Glucose, Bld: 99 mg/dL (ref 65–99)
Potassium: 4.2 mmol/L (ref 3.5–5.3)
Sodium: 141 mmol/L (ref 135–146)
Total Bilirubin: 0.4 mg/dL (ref 0.2–1.2)
Total Protein: 7.2 g/dL (ref 6.1–8.1)
eGFR: 44 mL/min/{1.73_m2} — ABNORMAL LOW (ref >=60)

## 2021-05-16 LAB — ANTI-NUCLEAR AB-TITER (ANA TITER)
ANA TITER: 1:80 {titer} — ABNORMAL HIGH
ANA Titer 1: 1:40 {titer} — ABNORMAL HIGH

## 2021-05-16 LAB — PROTEIN ELECTROPHORESIS, SERUM, WITH REFLEX
Albumin ELP: 4.1 g/dL (ref 3.8–4.8)
Alpha 1: 0.3 g/dL (ref 0.2–0.3)
Alpha 2: 0.8 g/dL (ref 0.5–0.9)
Beta 2: 0.5 g/dL (ref 0.2–0.5)
Beta Globulin: 0.4 g/dL (ref 0.4–0.6)
Gamma Globulin: 1.3 g/dL (ref 0.8–1.7)
Total Protein: 7.5 g/dL (ref 6.1–8.1)

## 2021-05-16 LAB — URINALYSIS, ROUTINE W REFLEX MICROSCOPIC
Bilirubin Urine: NEGATIVE
Glucose, UA: NEGATIVE
Hgb urine dipstick: NEGATIVE
Ketones, ur: NEGATIVE
Leukocytes,Ua: NEGATIVE
Nitrite: NEGATIVE
Protein, ur: NEGATIVE
Specific Gravity, Urine: 1.02 (ref 1.001–1.035)
pH: 6 (ref 5.0–8.0)

## 2021-05-16 LAB — C3 AND C4
C3 Complement: 156 mg/dL (ref 83–193)
C4 Complement: 55 mg/dL (ref 15–57)

## 2021-05-16 LAB — SJOGRENS SYNDROME-B EXTRACTABLE NUCLEAR ANTIBODY: SSB (La) (ENA) Antibody, IgG: <1 AI

## 2021-05-16 LAB — RHEUMATOID FACTOR: Rheumatoid fact SerPl-aCnc: <14 IU/mL (ref <14)

## 2021-05-16 LAB — DRUG MONITOR, TRAMADOL,QN, URINE
Desmethyltramadol: NEGATIVE ng/mL (ref <100)
Tramadol: NEGATIVE ng/mL (ref <100)

## 2021-05-16 LAB — DM TEMPLATE

## 2021-05-16 LAB — SEDIMENTATION RATE: Sed Rate: 34 mm/h — ABNORMAL HIGH (ref 0–30)

## 2021-05-16 LAB — SJOGRENS SYNDROME-A EXTRACTABLE NUCLEAR ANTIBODY: SSA (Ro) (ENA) Antibody, IgG: >8 AI — AB

## 2021-05-16 NOTE — Progress Notes (Signed)
SPEP did not reveal any abnormal proteins.

## 2021-05-16 NOTE — Progress Notes (Signed)
UDS negative for tramadol-the patient takes tramadol very sparingly for pain relief.

## 2021-05-17 NOTE — Progress Notes (Signed)
ANA remains positive but is a low titer.  No changes recommended at this time.

## 2021-05-22 ENCOUNTER — Encounter (HOSPITAL_COMMUNITY): Payer: Self-pay | Admitting: Psychiatry

## 2021-05-22 ENCOUNTER — Telehealth (INDEPENDENT_AMBULATORY_CARE_PROVIDER_SITE_OTHER): Payer: Medicare Other | Admitting: Psychiatry

## 2021-05-22 ENCOUNTER — Other Ambulatory Visit: Payer: Self-pay

## 2021-05-22 DIAGNOSIS — F331 Major depressive disorder, recurrent, moderate: Secondary | ICD-10-CM | POA: Diagnosis not present

## 2021-05-22 MED ORDER — ALPRAZOLAM 0.5 MG PO TABS: 0.5000 mg | ORAL_TABLET | Freq: Three times a day (TID) | ORAL | 2 refills | Status: DC | PRN

## 2021-05-22 MED ORDER — TEMAZEPAM 30 MG PO CAPS: 30.0000 mg | ORAL_CAPSULE | Freq: Every evening | ORAL | 2 refills | Status: DC | PRN

## 2021-05-22 NOTE — Progress Notes (Deleted)
Virtual Visit via Telephone Note  I connected with Alexandria Leach on 05/22/21 at  8:40 AM EST by telephone and verified that I am speaking with the correct person using two identifiers.  Location: Patient: *** Provider: ***   I discussed the limitations, risks, security and privacy concerns of performing an evaluation and management service by telephone and the availability of in person appointments. I also discussed with the patient that there may be a patient responsible charge related to this service. The patient expressed understanding and agreed to proceed.   History of Present Illness:    Observations/Objective:   Assessment and Plan:   Follow Up Instructions:    I discussed the assessment and treatment plan with the patient. The patient was provided an opportunity to ask questions and all were answered. The patient agreed with the plan and demonstrated an understanding of the instructions.   The patient was advised to call back or seek an in-person evaluation if the symptoms worsen or if the condition fails to improve as anticipated.  I provided 12 minutes of non-face-to-face time during this encounter.   [name], MD  [facility] MD/PA/NP OP Progress Note  05/22/2021 8:54 AM Lusero Leach  MRN:  [id number]  Chief Complaint: anxiety, insomnia HPI: This patient is a 71 year old separated black female who lives alone in [place].  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  She has 4 sons.  The patient returns for follow-up after 2 months.  Last time she was having a lot of trouble sleeping and we increased her temazepam to 30 mg.  This was working well for her but [facility] no longer has it in stock and will need to go to [facility].  She denies significant depression and is not on an antidepressant right now but does take Xanax once or twice a day to help with her anxiety.  Her new medication  for atrial fibrillation is working well.  Her Sjogren's syndrome has been difficult as her eyes and mouth are very dry.  She also has body aches and fatigue at times.  However she is trying to get out with friends and stay active.  She denies thoughts of self-harm or suicidal ideation Visit Diagnosis:    ICD-10-CM   1. Major depressive disorder, recurrent episode, moderate ([place])  F33.1       Past Psychiatric History: none  Past Medical History:  Past Medical History:  Diagnosis Date   Ankle fracture, left 02/19/2015   from a fall   Anxiety    Cataract 2014   corrected with surgery   CHF (congestive heart failure) (HCC)    CKD (chronic kidney disease) stage 3, GFR 30-59 ml/min (HCC)    saw nephrologist Dr Harden Mo   DDD (degenerative disc disease), cervical    Depression    Fibromyalgia    Glaucoma    s/p surgery, sees ophtho Q6 mo   History of DVT (deep vein thrombosis) several times latest 2012   receives coumadin while hospitalized   History of kidney stones 2010   History of pulmonary embolism 2001, 2006   completed coumadin courses   History of rheumatic fever x3   HLD (hyperlipidemia)    HTN (hypertension)    Insomnia    Lung nodule 09/22/2012   RLL - 71m, stable since 2014. Thought benign.    Osteoarthritis    shoulders and knees, not RA per Dr DEstanislado Pandy positive  ANA, positive Ro   Osteopenia 09/18/2015   DEXA T -1.1 hip, -0.2 spine 08/2015    Personal history of urinary calculi latest 2014   Pneumonia 12/02/2011   PONV (postoperative nausea and vomiting)    Refusal of blood transfusions as patient is Jehovah's Witness    Rheumatic heart disease 1980   s/p mitral valve repair 1980   Sjogren's syndrome ([place])    Trimalleolar fracture of left ankle 02/23/2015    Past Surgical History:  Procedure Laterality Date   BREAST BIOPSY Right 2006   benign   CARDIOVERSION N/A 07/18/2020   Procedure: CARDIOVERSION;  Surgeon: Elouise Munroe, MD;  Location: [facility] ENDOSCOPY;   Service: Cardiovascular;  Laterality: N/A;   CHOLECYSTECTOMY  11/27/2011   Procedure: LAPAROSCOPIC CHOLECYSTECTOMY WITH INTRAOPERATIVE CHOLANGIOGRAM;  Surgeon: Adin Hector, MD;  Location: [place];  Service: General;  Laterality: N/A;  laparoscopic cholecystectomy with choleangiogram umbilical hernia repair   COLONOSCOPY  07/2014   WNL Amedeo Plenty)   COLONOSCOPY  08/2019   3 TAs, rpt 3 yrs ([place])   dexa  08/2012   normal per patient - no records available   ESOPHAGOGASTRODUODENOSCOPY  08/2019   reactive gastropathy, neg H pylori ([place])   EYE SURGERY Bilateral 2014   cataract removal   MITRAL VALVE REPAIR  1980   open heart   ORIF ANKLE FRACTURE Left 02/26/2015   Procedure: OPEN REDUCTION INTERNAL FIXATION (ORIF) LEFT TRIMALLEOLAR ANKLE FRACTURE;  Surgeon: [name], MD;  Location: [place];  Service: Orthopedics;  Laterality: Left;   TUBAL LIGATION  1610   UMBILICAL HERNIA REPAIR  11/27/2011   Procedure: HERNIA REPAIR UMBILICAL ADULT;  Surgeon: Adin Hector, MD;  Location: [place];  Service: General;  Laterality: N/A;   [employer]   for fibroids -- partial, ovaries remain    Family Psychiatric History: see below  Family History:  Family History  Problem Relation Age of Onset   Cancer Mother        lung (nonsmoker)   CAD Mother        MI in her 37s   ALS Mother    Kidney disease Father    Alcohol abuse Father    Diabetes Father    Lupus Sister        and niece   Diabetes Sister    Stroke Sister    Depression Sister    Lupus Sister    Blindness Sister    Cancer Maternal Uncle        bone   Stroke Maternal Grandmother    Cancer Brother        bone   Diabetes Brother    Heart attack Brother    Healthy Son    Healthy Son    Healthy Son    Healthy Son    Kidney failure Other        on HD   Breast cancer Neg Hx     Social History:  Social History   Socioeconomic History   Marital status: Married    Spouse name: [name]   Number of  children: 4   Years of education: 12   Highest education level: Not on file  Occupational History    Employer: UNEMPLOYED    Comment: Disability  Tobacco Use   Smoking status: Never    Passive exposure: Past   Smokeless tobacco: Never  Vaping Use   Vaping Use: Never used  Substance and Sexual Activity   Alcohol use: No  Alcohol/week: 0.0 standard drinks   Drug use: No   Sexual activity: Not Currently    Birth control/protection: Surgical  Other Topics Concern   Not on file  Social History Narrative   Lives with son, 1 dog   Occupation: unemployed, on disability for fibromyalgia since 2008.   Edu: HS   Religion: Jehova's witness   Activity: volunteers at senior center   Diet: some water, fruits/vegetables daily   No caffeine use   Social Determinants of Health   Financial Resource Strain: Low Risk    Difficulty of Paying Living Expenses: Not very hard  Food Insecurity: No Food Insecurity   Worried About Charity fundraiser in the Last Year: Never true   Ran Out of Food in the Last Year: Never true  Transportation Needs: No Transportation Needs   Lack of Transportation (Medical): No   Lack of Transportation (Non-Medical): No  Physical Activity: Sufficiently Active   Days of Exercise per Week: 7 days   Minutes of Exercise per Session: 50 min  Stress: No Stress Concern Present   Feeling of Stress : Only a little  Social Connections: Moderately Integrated   Frequency of Communication with Friends and Family: More than three times a week   Frequency of Social Gatherings with Friends and Family: Never   Attends Religious Services: More than 4 times per year   Active Member of [employer] or Organizations: No   Attends Archivist Meetings: Never   Marital Status: Married    Allergies:  Allergies  Allergen Reactions   Cymbalta [Duloxetine Hcl] Other (See Comments)    tachycardia   Statins Nausea Only and Other (See Comments)    Muscle cramps also   Sulfa  Antibiotics Nausea And Vomiting    Metabolic Disorder Labs: Lab Results  Component Value Date   HGBA1C 6.0 (H) 01/12/2021   MPG 126 01/12/2021   MPG 114 10/13/2015   No results found for: PROLACTIN Lab Results  Component Value Date   CHOL 193 01/12/2021   TRIG 93 01/12/2021   HDL 70 01/12/2021   CHOLHDL 2.8 01/12/2021   VLDL 14.4 12/23/2019   LDLCALC 104 (H) 01/12/2021   LDLCALC 82 05/29/2020   Lab Results  Component Value Date   TSH 1.49 01/12/2021   TSH 4.12 12/23/2019    Therapeutic Level Labs: No results found for: LITHIUM No results found for: VALPROATE No components found for:  CBMZ  Current Medications: Current Outpatient Medications  Medication Sig Dispense Refill   ALPRAZolam (XANAX) 0.5 MG tablet Take 1 tablet (0.5 mg total) by mouth 3 (three) times daily as needed for anxiety. 270 tablet 2   amoxicillin (AMOXIL) 500 MG tablet Take 4 tablets ('2000mg'$ ) by mouth 30-60 min prior to dental procedure. 4 tablet 5   antiseptic oral rinse (BIOTENE) LIQD 15 mLs by Mouth Rinse route 2 (two) times daily as needed for dry mouth.     cholecalciferol (VITAMIN D) 1000 UNITS tablet Take 1,000 Units by mouth daily.     co-enzyme Q-10 30 MG capsule Take 30 mg by mouth daily.     diltiazem (CARDIZEM CD) 360 MG 24 hr capsule TAKE 1 CAPSULE BY MOUTH  DAILY 90 capsule 3   docusate sodium (COLACE) 100 MG capsule Take 100 mg by mouth daily.     dofetilide (TIKOSYN) 250 MCG capsule Take 1 capsule (250 mcg total) by mouth 2 (two) times daily. 60 capsule 0   ELIQUIS 5 MG TABS tablet TAKE 1 TABLET  BY MOUTH  TWICE DAILY 180 tablet 3   ezetimibe (ZETIA) 10 MG tablet TAKE 1 TABLET BY MOUTH AT  BEDTIME 90 tablet 3   furosemide (LASIX) 20 MG tablet TAKE 1 TABLET BY MOUTH  DAILY AS NEEDED FOR EDEMA 90 tablet 3   gabapentin (NEURONTIN) 300 MG capsule TAKE 2 CAPSULES BY MOUTH IN THE MORNING , 1 CAPSULE IN  THE AFTERNOON AND 2  CAPSULES AT BEDTIME 450 capsule 3   losartan (COZAAR) 50 MG tablet  TAKE 1 TABLET BY MOUTH  DAILY 90 tablet 3   lovastatin (MEVACOR) 10 MG tablet TAKE 1 TABLET BY MOUTH  EVERY MONDAY, WEDNESDAY,  AND FRIDAY 39 tablet 3   metoprolol tartrate (LOPRESSOR) 50 MG tablet Take 1.5 tablets (75 mg total) by mouth 2 (two) times daily. 270 tablet 3   pantoprazole (PROTONIX) 40 MG tablet Take 40 mg by mouth daily.     Polyethyl Glycol-Propyl Glycol (SYSTANE) 0.4-0.3 % GEL ophthalmic gel Place 1 drop into both eyes 2 (two) times daily.      potassium chloride (KLOR-CON) 10 MEQ tablet Take 1 tablet (10 mEq total) by mouth daily. 90 tablet 3   RESTASIS 0.05 % ophthalmic emulsion INSTILL ONE DROP IN BOTH  EYES TWO TIMES DAILY 180 each 0   temazepam (RESTORIL) 30 MG capsule Take 1 capsule (30 mg total) by mouth at bedtime as needed for sleep. 30 capsule 2   traMADol (ULTRAM) 50 MG tablet Take 1 tablet (50 mg total) by mouth daily as needed. (Patient taking differently: Take 50 mg by mouth daily as needed. Rare use, severe pain (10/10)) 30 tablet 0   vitamin B-12 (CYANOCOBALAMIN) 1000 MCG tablet Take 1 tablet (1,000 mcg total) by mouth every Monday, Wednesday, and Friday.     No current facility-administered medications for this visit.     Musculoskeletal: Strength & Muscle Tone: na Gait & Station: na Patient leans: N/A  Psychiatric Specialty Exam: Review of Systems  Eyes:  Positive for pain and redness.  Musculoskeletal:  Positive for arthralgias and myalgias.  All other systems reviewed and are negative.  There were no vitals taken for this visit.There is no height or weight on file to calculate BMI.  General Appearance: NA  Eye Contact:  NA  Speech:  Clear and Coherent  Volume:  Normal  Mood:  Euthymic  Affect:  NA  Thought Process:  Goal Directed  Orientation:  Full (Time, Place, and Person)  Thought Content: WDL   Suicidal Thoughts:  No  Homicidal Thoughts:  No  Memory:  Immediate;   Good Recent;   Good Remote;   Good  Judgement:  Good  Insight:  Good   Psychomotor Activity:  Decreased  Concentration:  Concentration: Good and Attention Span: Good  Recall:  Good  Fund of Knowledge: Good  Language: Good  Akathisia:  No  Handed:  Right  AIMS (if indicated): not done  Assets:  Communication Skills Desire for Improvement Resilience Social Support Talents/Skills  ADL's:  Intact  Cognition: WNL  Sleep:  Good   Screenings: GAD-7    Flowsheet Row Office Visit from 12/28/2019 in [place] at [facility] Visit from 12/22/2018 in [place] at [facility]  Total GAD-7 Score 0 [place] from 12/11/2017 in [place] at [place] from 12/09/2016 in [place] at [place] from 11/30/2015 in [place] at [place]  Total Score (max 30 points ) '20 19 18      '$ PHQ2-9    Flowsheet Row Video Visit from 05/22/2021 in [place] Video Visit from 03/30/2021 in [place] Video Visit from 02/23/2021 in [place] Video Visit from 12/05/2020 in [place] Video Visit from 08/18/2020 in [facility]  PHQ-2 Total Score 0 1 0 0 0      Flowsheet Row Video Visit from 05/22/2021 in [place] Video Visit from 03/30/2021 in [facility] Video Visit from 02/23/2021 in [place] No Risk No Risk No Risk        Assessment and Plan: This patient is a 71 year old female with a history of depression and anxiety.  She is doing well with her current regimen.  She will continue Restoril 30 mg at bedtime for sleep and Xanax 0.5 mg up to 3 times daily as needed for anxiety or  sleep.  She will return to see me in 3 months.  Collaboration of Care: Collaboration of Care: Primary Care Provider AEB chart notes are available to PCP through the epic system  Patient/Guardian was advised Release of Information must be obtained prior to any record release in order to collaborate their care with an outside provider. Patient/Guardian was advised if they have not already done so to contact the registration department to sign all necessary forms in order for [place] to release information regarding their care.   Consent: Patient/Guardian gives verbal consent for treatment and assignment of benefits for services provided during this visit. Patient/Guardian expressed understanding and agreed to proceed.    [name], MD 05/22/2021, 8:54 AM

## 2021-05-22 NOTE — Progress Notes (Signed)
Virtual Visit via Telephone Note  I connected with Alexandria Leach on 05/22/21 at  8:40 AM EST by telephone and verified that I am speaking with the correct person using two identifiers.  Location: Patient: home Provider: office   I discussed the limitations, risks, security and privacy concerns of performing an evaluation and management service by telephone and the availability of in person appointments. I also discussed with the patient that there may be a patient responsible charge related to this service. The patient expressed understanding and agreed to proceed.       I discussed the assessment and treatment plan with the patient. The patient was provided an opportunity to ask questions and all were answered. The patient agreed with the plan and demonstrated an understanding of the instructions.   The patient was advised to call back or seek an in-person evaluation if the symptoms worsen or if the condition fails to improve as anticipated.  I provided 12 minutes of non-face-to-face time during this encounter.   [name], MD  [facility] MD/PA/NP OP Progress Note  05/22/2021 9:02 AM Alexandria Leach  MRN:  [id number]  Chief Complaint: anxiety, insomnia HPI: This patient is a 71 year old separated black female who lives alone in [place].  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  She has 4 sons.  The patient returns for follow-up after 2 months.  Last time she was having a lot of trouble sleeping and we increased her temazepam to 30 mg.  This was working well for her but [facility] no longer has it in stock and will need to go to [employer] [facility].  She denies significant depression and is not on an antidepressant right now but does take Xanax once or twice a day to help with her anxiety.  Her new medication for atrial fibrillation is working well.  Her Sjogren's syndrome has been difficult as her eyes and mouth  are very dry.  She also has body aches and fatigue at times.  However she is trying to get out with friends and stay active.  She denies thoughts of self-harm or suicidal ideation Visit Diagnosis:    ICD-10-CM   1. Major depressive disorder, recurrent episode, moderate ([place])  F33.1       Past Psychiatric History: none  Past Medical History:  Past Medical History:  Diagnosis Date   Ankle fracture, left 02/19/2015   from a fall   Anxiety    Cataract 2014   corrected with surgery   CHF (congestive heart failure) (HCC)    CKD (chronic kidney disease) stage 3, GFR 30-59 ml/min (HCC)    saw nephrologist Dr Harden Mo   DDD (degenerative disc disease), cervical    Depression    Fibromyalgia    Glaucoma    s/p surgery, sees ophtho Q6 mo   History of DVT (deep vein thrombosis) several times latest 2012   receives coumadin while hospitalized   History of kidney stones 2010   History of pulmonary embolism 2001, 2006   completed coumadin courses   History of rheumatic fever x3   HLD (hyperlipidemia)    HTN (hypertension)    Insomnia    Lung nodule 09/22/2012   RLL - 32m, stable since 2014. Thought benign.    Osteoarthritis    shoulders and knees, not RA per Dr DEstanislado Pandy positive ANA, positive Ro   Osteopenia 09/18/2015   DEXA T -1.1 hip, -0.2 spine 08/2015  Personal history of urinary calculi latest 2014   Pneumonia 12/02/2011   PONV (postoperative nausea and vomiting)    Refusal of blood transfusions as patient is Jehovah's Witness    Rheumatic heart disease 1980   s/p mitral valve repair 1980   Sjogren's syndrome ([place])    Trimalleolar fracture of left ankle 02/23/2015    Past Surgical History:  Procedure Laterality Date   BREAST BIOPSY Right 2006   benign   CARDIOVERSION N/A 07/18/2020   Procedure: CARDIOVERSION;  Surgeon: Elouise Munroe, MD;  [facility]: [facility] ENDOSCOPY;  Service: Cardiovascular;  Laterality: N/A;   CHOLECYSTECTOMY  11/27/2011   Procedure: LAPAROSCOPIC  CHOLECYSTECTOMY WITH INTRAOPERATIVE CHOLANGIOGRAM;  Surgeon: Adin Hector, MD;  Location: [place];  Service: General;  Laterality: N/A;  laparoscopic cholecystectomy with choleangiogram umbilical hernia repair   COLONOSCOPY  07/2014   WNL Amedeo Plenty)   COLONOSCOPY  08/2019   3 TAs, rpt 3 yrs (Brahmbhatt)   dexa  08/2012   normal per patient - no records available   ESOPHAGOGASTRODUODENOSCOPY  08/2019   reactive gastropathy, neg H pylori ([place])   EYE SURGERY Bilateral 2014   cataract removal   MITRAL VALVE REPAIR  1980   open heart   ORIF ANKLE FRACTURE Left 02/26/2015   Procedure: OPEN REDUCTION INTERNAL FIXATION (ORIF) LEFT TRIMALLEOLAR ANKLE FRACTURE;  Surgeon: [name], MD;  Location: [place];  Service: Orthopedics;  Laterality: Left;   TUBAL LIGATION  4403   UMBILICAL HERNIA REPAIR  11/27/2011   Procedure: HERNIA REPAIR UMBILICAL ADULT;  Surgeon: Adin Hector, MD;  Location: [place];  Service: General;  Laterality: N/A;   [place]   for fibroids -- partial, ovaries remain    Family Psychiatric History: see below  Family History:  Family History  Problem Relation Age of Onset   Cancer Mother        lung (nonsmoker)   CAD Mother        MI in her 59s   ALS Mother    Kidney disease Father    Alcohol abuse Father    Diabetes Father    Lupus Sister        and niece   Diabetes Sister    Stroke Sister    Depression Sister    Lupus Sister    Blindness Sister    Cancer Maternal Uncle        bone   Stroke Maternal Grandmother    Cancer Brother        bone   Diabetes Brother    Heart attack Brother    Healthy Son    Healthy Son    Healthy Son    Healthy Son    Kidney failure Other        on HD   Breast cancer Neg Hx     Social History:  Social History   Socioeconomic History   Marital status: Married    Spouse name: [name]   Number of children: 4   Years of education: 12   Highest education level: Not on file  Occupational History     Employer: UNEMPLOYED    Comment: Disability  Tobacco Use   Smoking status: Never    Passive exposure: Past   Smokeless tobacco: Never  Vaping Use   Vaping Use: Never used  Substance and Sexual Activity   Alcohol use: No    Alcohol/week: 0.0 standard drinks   Drug use: No   Sexual activity: Not Currently  Birth control/protection: Surgical  Other Topics Concern   Not on file  Social History Narrative   Lives with son, 1 dog   Occupation: unemployed, on disability for fibromyalgia since 2008.   Edu: HS   Religion: Jehova's witness   Activity: volunteers at senior center   Diet: some water, fruits/vegetables daily   No caffeine use   Social Determinants of Health   Financial Resource Strain: Low Risk    Difficulty of Paying Living Expenses: Not very hard  Food Insecurity: No Food Insecurity   Worried About Charity fundraiser in the Last Year: Never true   Ran Out of Food in the Last Year: Never true  Transportation Needs: No Transportation Needs   Lack of Transportation (Medical): No   Lack of Transportation (Non-Medical): No  Physical Activity: Sufficiently Active   Days of Exercise per Week: 7 days   Minutes of Exercise per Session: 50 min  Stress: No Stress Concern Present   Feeling of Stress : Only a little  Social Connections: Moderately Integrated   Frequency of Communication with Friends and Family: More than three times a week   Frequency of Social Gatherings with Friends and Family: Never   Attends Religious Services: More than 4 times per year   Active Member of [employer] or Organizations: No   Attends Archivist Meetings: Never   Marital Status: Married    Allergies:  Allergies  Allergen Reactions   Cymbalta [Duloxetine Hcl] Other (See Comments)    tachycardia   Statins Nausea Only and Other (See Comments)    Muscle cramps also   Sulfa Antibiotics Nausea And Vomiting    Metabolic Disorder Labs: Lab Results  Component Value Date   HGBA1C  6.0 (H) 01/12/2021   MPG 126 01/12/2021   MPG 114 10/13/2015   No results found for: PROLACTIN Lab Results  Component Value Date   CHOL 193 01/12/2021   TRIG 93 01/12/2021   HDL 70 01/12/2021   CHOLHDL 2.8 01/12/2021   VLDL 14.4 12/23/2019   LDLCALC 104 (H) 01/12/2021   LDLCALC 82 05/29/2020   Lab Results  Component Value Date   TSH 1.49 01/12/2021   TSH 4.12 12/23/2019    Therapeutic Level Labs: No results found for: LITHIUM No results found for: VALPROATE No components found for:  CBMZ  Current Medications: Current Outpatient Medications  Medication Sig Dispense Refill   ALPRAZolam (XANAX) 0.5 MG tablet Take 1 tablet (0.5 mg total) by mouth 3 (three) times daily as needed for anxiety. 270 tablet 2   amoxicillin (AMOXIL) 500 MG tablet Take 4 tablets ('2000mg'$ ) by mouth 30-60 min prior to dental procedure. 4 tablet 5   antiseptic oral rinse (BIOTENE) LIQD 15 mLs by Mouth Rinse route 2 (two) times daily as needed for dry mouth.     cholecalciferol (VITAMIN D) 1000 UNITS tablet Take 1,000 Units by mouth daily.     co-enzyme Q-10 30 MG capsule Take 30 mg by mouth daily.     diltiazem (CARDIZEM CD) 360 MG 24 hr capsule TAKE 1 CAPSULE BY MOUTH  DAILY 90 capsule 3   docusate sodium (COLACE) 100 MG capsule Take 100 mg by mouth daily.     dofetilide (TIKOSYN) 250 MCG capsule Take 1 capsule (250 mcg total) by mouth 2 (two) times daily. 60 capsule 0   ELIQUIS 5 MG TABS tablet TAKE 1 TABLET BY MOUTH  TWICE DAILY 180 tablet 3   ezetimibe (ZETIA) 10 MG tablet TAKE 1 TABLET  BY MOUTH AT  BEDTIME 90 tablet 3   furosemide (LASIX) 20 MG tablet TAKE 1 TABLET BY MOUTH  DAILY AS NEEDED FOR EDEMA 90 tablet 3   gabapentin (NEURONTIN) 300 MG capsule TAKE 2 CAPSULES BY MOUTH IN THE MORNING , 1 CAPSULE IN  THE AFTERNOON AND 2  CAPSULES AT BEDTIME 450 capsule 3   losartan (COZAAR) 50 MG tablet TAKE 1 TABLET BY MOUTH  DAILY 90 tablet 3   lovastatin (MEVACOR) 10 MG tablet TAKE 1 TABLET BY MOUTH  EVERY  MONDAY, WEDNESDAY,  AND FRIDAY 39 tablet 3   metoprolol tartrate (LOPRESSOR) 50 MG tablet Take 1.5 tablets (75 mg total) by mouth 2 (two) times daily. 270 tablet 3   pantoprazole (PROTONIX) 40 MG tablet Take 40 mg by mouth daily.     Polyethyl Glycol-Propyl Glycol (SYSTANE) 0.4-0.3 % GEL ophthalmic gel Place 1 drop into both eyes 2 (two) times daily.      potassium chloride (KLOR-CON) 10 MEQ tablet Take 1 tablet (10 mEq total) by mouth daily. 90 tablet 3   RESTASIS 0.05 % ophthalmic emulsion INSTILL ONE DROP IN BOTH  EYES TWO TIMES DAILY 180 each 0   temazepam (RESTORIL) 30 MG capsule Take 1 capsule (30 mg total) by mouth at bedtime as needed for sleep. 30 capsule 2   traMADol (ULTRAM) 50 MG tablet Take 1 tablet (50 mg total) by mouth daily as needed. (Patient taking differently: Take 50 mg by mouth daily as needed. Rare use, severe pain (10/10)) 30 tablet 0   vitamin B-12 (CYANOCOBALAMIN) 1000 MCG tablet Take 1 tablet (1,000 mcg total) by mouth every Monday, Wednesday, and Friday.     No current facility-administered medications for this visit.     Musculoskeletal: Strength & Muscle Tone: na Gait & Station: na Patient leans: N/A  Psychiatric Specialty Exam: Review of Systems  Eyes:  Positive for pain and redness.  Musculoskeletal:  Positive for arthralgias and myalgias.  All other systems reviewed and are negative.  There were no vitals taken for this visit.There is no height or weight on file to calculate BMI.  General Appearance: NA  Eye Contact:  NA  Speech:  Clear and Coherent  Volume:  Normal  Mood:  Euthymic  Affect:  NA  Thought Process:  Goal Directed  Orientation:  Full (Time, Place, and Person)  Thought Content: WDL   Suicidal Thoughts:  No  Homicidal Thoughts:  No  Memory:  Immediate;   Good Recent;   Good Remote;   Good  Judgement:  Good  Insight:  Good  Psychomotor Activity:  Decreased  Concentration:  Concentration: Good and Attention Span: Good  Recall:   Good  Fund of Knowledge: Good  Language: Good  Akathisia:  No  Handed:  Right  AIMS (if indicated): not done  Assets:  Communication Skills Desire for Improvement Resilience Social Support Talents/Skills  ADL's:  Intact  Cognition: WNL  Sleep:  Good   Screenings: GAD-7    Flowsheet Row Office Visit from 12/28/2019 in [place] at [facility] Visit from 12/22/2018 in [place] at [facility]  Total GAD-7 Score 0 5      [place] from 12/11/2017 in [place] at [place] from 12/09/2016 in [place] at [facility] from 11/30/2015 in [place] at [facility]  Total Score (max 30 points ) '20 19 18      '$ PHQ2-9  Flowsheet Row Video Visit from 05/22/2021 in [place] Video Visit from 03/30/2021 in [place] Video Visit from 02/23/2021 in [place] Video Visit from 12/05/2020 in [place] Video Visit from 08/18/2020 in [place] [facility]  PHQ-2 Total Score 0 1 0 0 0      Flowsheet Row Video Visit from 05/22/2021 in [place] Video Visit from 03/30/2021 in [place] Video Visit from 02/23/2021 in [place] No Risk No Risk No Risk        Assessment and Plan: This patient is a 71 year old female with a history of depression and anxiety.  She is doing well with her current regimen.  She will continue Restoril 30 mg at bedtime for sleep and Xanax 0.5 mg up to 3 times daily as needed for anxiety or sleep.  She will return to see me in 3 months.  Collaboration of Care: Collaboration of Care: Primary Care  Provider AEB chart notes are available to PCP through the epic system  Patient/Guardian was advised Release of Information must be obtained prior to any record release in order to collaborate their care with an outside provider. Patient/Guardian was advised if they have not already done so to contact the registration department to sign all necessary forms in order for [place] to release information regarding their care.   Consent: Patient/Guardian gives verbal consent for treatment and assignment of benefits for services provided during this visit. Patient/Guardian expressed understanding and agreed to proceed.    [name], MD 05/22/2021, 9:02 AM

## 2021-06-06 ENCOUNTER — Ambulatory Visit: Payer: Medicare Other | Admitting: Internal Medicine

## 2021-06-13 ENCOUNTER — Encounter: Payer: Self-pay | Admitting: Internal Medicine

## 2021-06-13 MED ORDER — DOFETILIDE 250 MCG PO CAPS: 250.0000 ug | ORAL_CAPSULE | Freq: Two times a day (BID) | ORAL | 6 refills | Status: DC

## 2021-06-22 ENCOUNTER — Other Ambulatory Visit: Payer: Self-pay | Admitting: Cardiology

## 2021-06-22 NOTE — Progress Notes (Signed)
? ?PCP:  Ria Bush, MD ?Primary Cardiologist: Kirk Ruths, MD ?Electrophysiologist: Cristopher Peru, MD  ? ?Alexandria Leach is a 71 y.o. female seen today for Cristopher Peru, MD for routine electrophysiology followup.  Since last being seen in our clinic the patient reports doing well overall. Takes lasix 1-2 times a month.  she denies chest pain, palpitations, dyspnea, PND, orthopnea, nausea, vomiting, dizziness, syncope, edema, weight gain, or early satiety. ? ?Past Medical History:  ?Diagnosis Date  ? Ankle fracture, left 02/19/2015  ? from a fall  ? Anxiety   ? Cataract 2014  ? corrected with surgery  ? CHF (congestive heart failure) ([place])   ? CKD (chronic kidney disease) stage 3, GFR 30-59 ml/min (HCC)   ? saw nephrologist Dr Harden Mo  ? DDD (degenerative disc disease), cervical   ? Depression   ? Fibromyalgia   ? Glaucoma   ? s/p surgery, sees ophtho Q6 mo  ? History of DVT (deep vein thrombosis) several times latest 2012  ? receives coumadin while hospitalized  ? History of kidney stones 2010  ? History of pulmonary embolism 2001, 2006  ? completed coumadin courses  ? History of rheumatic fever x3  ? HLD (hyperlipidemia)   ? HTN (hypertension)   ? Insomnia   ? Lung nodule 09/22/2012  ? RLL - 39m, stable since 2014. Thought benign.   ? Osteoarthritis   ? shoulders and knees, not RA per Dr DEstanislado Pandy positive ANA, positive Ro  ? Osteopenia 09/18/2015  ? DEXA T -1.1 hip, -0.2 spine 08/2015   ? Personal history of urinary calculi latest 2014  ? Pneumonia 12/02/2011  ? PONV (postoperative nausea and vomiting)   ? Refusal of blood transfusions as patient is Jehovah's Witness   ? Rheumatic heart disease 1980  ? s/p mitral valve repair 1980  ? [place]'s [place]   ? Trimalleolar fracture of left ankle 02/23/2015  ? ?Past Surgical History:  ?Procedure Laterality Date  ? BREAST BIOPSY Right 2006  ? benign  ? CARDIOVERSION N/A 07/18/2020  ? Procedure: CARDIOVERSION;  Surgeon: AElouise Munroe MD;  [facility]  Service: Cardiovascular;  Laterality: N/A;  ? CHOLECYSTECTOMY  11/27/2011  ? Procedure: LAPAROSCOPIC CHOLECYSTECTOMY WITH INTRAOPERATIVE CHOLANGIOGRAM;  Surgeon: HAdin Hector MD;  [place] [facility]  Service: General;  Laterality: N/A;  laparoscopic cholecystectomy with choleangiogram umbilical hernia repair  ? COLONOSCOPY  07/2014  ? WNL (Amedeo Plenty  ? COLONOSCOPY  08/2019  ? 3 TAs, rpt 3 yrs (Brahmbhatt)  ? dexa  08/2012  ? normal per patient - no records available  ? ESOPHAGOGASTRODUODENOSCOPY  08/2019  ? reactive gastropathy, neg H pylori ([place]hatt)  ? EYE SURGERY Bilateral 2014  ? cataract removal  ? MITRAL VALVE REPAIR  1980  ? open heart  ? ORIF ANKLE FRACTURE Left 02/26/2015  ? Procedure: OPEN REDUCTION INTERNAL FIXATION (ORIF) LEFT TRIMALLEOLAR ANKLE FRACTURE;  Surgeon: NLeandrew Koyanagi MD;  [place] [facility]  Service: Orthopedics;  Laterality: Left;  ? TUBAL LIGATION  1980  ? UMBILICAL HERNIA REPAIR  11/27/2011  ? Procedure: HERNIA REPAIR UMBILICAL ADULT;  Surgeon: HAdin Hector MD;  [place] [facility]  Service: General;  Laterality: N/A;  ? [place] ? for fibroids -- partial, ovaries remain  ? ? ?Current Outpatient Medications  ?Medication Sig Dispense Refill  ? ALPRAZolam (XANAX) 0.5 MG tablet Take 1 tablet (0.5 mg total) by mouth 3 (three) times daily as needed for anxiety. 270 tablet 2  ?  amoxicillin (AMOXIL) 500 MG tablet Take 4 tablets ('2000mg'$ ) by mouth 30-60 min prior to dental procedure. 4 tablet 5  ? antiseptic oral rinse (BIOTENE) LIQD 15 mLs by Mouth Rinse route 2 (two) times daily as needed for dry mouth.    ? cholecalciferol (VITAMIN D) 1000 UNITS tablet Take 1,000 Units by mouth daily.    ? co-enzyme Q-10 30 MG capsule Take 30 mg by mouth daily.    ? diltiazem (CARDIZEM CD) 360 MG 24 hr capsule TAKE 1 CAPSULE BY MOUTH  DAILY 90 capsule 3  ? docusate sodium (COLACE) 100 MG capsule Take 100 mg by mouth daily.    ? dofetilide (TIKOSYN) 250 MCG capsule Take 1 capsule  (250 mcg total) by mouth 2 (two) times daily. 60 capsule 6  ? ELIQUIS 5 MG TABS tablet TAKE 1 TABLET BY MOUTH  TWICE DAILY 180 tablet 3  ? ezetimibe (ZETIA) 10 MG tablet TAKE 1 TABLET BY MOUTH AT  BEDTIME 90 tablet 3  ? furosemide (LASIX) 20 MG tablet TAKE 1 TABLET BY MOUTH  DAILY AS NEEDED FOR EDEMA 90 tablet 3  ? gabapentin (NEURONTIN) 300 MG capsule TAKE 2 CAPSULES BY MOUTH IN THE MORNING , 1 CAPSULE IN  THE AFTERNOON AND 2  CAPSULES AT BEDTIME 450 capsule 3  ? losartan (COZAAR) 50 MG tablet TAKE 1 TABLET BY MOUTH  DAILY 90 tablet 3  ? lovastatin (MEVACOR) 10 MG tablet TAKE 1 TABLET BY MOUTH  EVERY MONDAY, WEDNESDAY,  AND FRIDAY 39 tablet 3  ? metoprolol tartrate (LOPRESSOR) 50 MG tablet TAKE 1 AND 1/2 TABLETS BY  MOUTH TWICE DAILY 270 tablet 3  ? pantoprazole (PROTONIX) 40 MG tablet Take 40 mg by mouth daily.    ? Polyethyl Glycol-Propyl Glycol (SYSTANE) 0.4-0.3 % GEL ophthalmic gel Place 1 drop into both eyes 2 (two) times daily.     ? RESTASIS 0.05 % ophthalmic emulsion INSTILL ONE DROP IN BOTH  EYES TWO TIMES DAILY 180 each 0  ? temazepam (RESTORIL) 30 MG capsule Take 1 capsule (30 mg total) by mouth at bedtime as needed for sleep. 30 capsule 2  ? traMADol (ULTRAM) 50 MG tablet Take 1 tablet (50 mg total) by mouth daily as needed. (Patient taking differently: Take 50 mg by mouth daily as needed. Rare use, severe pain (10/10)) 30 tablet 0  ? vitamin B-12 (CYANOCOBALAMIN) 1000 MCG tablet Take 1 tablet (1,000 mcg total) by mouth every Monday, Wednesday, and Friday.    ? potassium chloride (KLOR-CON) 10 MEQ tablet Take 1 tablet (10 mEq total) by mouth daily. 90 tablet 3  ? ?No current facility-administered medications for this visit.  ? ? ?Allergies  ?Allergen Reactions  ? Cymbalta [Duloxetine Hcl] Other (See Comments)  ?  tachycardia  ? Statins Nausea Only and Other (See Comments)  ?  Muscle cramps also  ? Sulfa Antibiotics Nausea And Vomiting  ? ? ?Social History  ? ?Socioeconomic History  ? Marital status:  Married  ?  Spouse name: [name]  ? Number of children: 4  ? Years of education: 68  ? Highest education level: Not on file  ?Occupational History  ?  Employer: UNEMPLOYED  ?  Comment: Disability  ?Tobacco Use  ? Smoking status: Never  ?  Passive exposure: Past  ? Smokeless tobacco: Never  ?Vaping Use  ? Vaping Use: Never used  ?Substance and Sexual Activity  ? Alcohol use: No  ?  Alcohol/week: 0.0 standard drinks  ? Drug use: No  ?  Sexual activity: Not Currently  ?  Birth control/protection: Surgical  ?Other Topics Concern  ? Not on file  ?Social History Narrative  ? Lives with son, 1 dog  ? Occupation: unemployed, on disability for fibromyalgia since 2008.  ? Edu: HS  ? Religion: Jehova's witness  ? Activity: volunteers at senior center  ? Diet: some water, fruits/vegetables daily  ? No caffeine use  ? ?Social Determinants of Health  ? ?Financial Resource Strain: Low Risk   ? Difficulty of Paying Living Expenses: Not very hard  ?Food Insecurity: No Food Insecurity  ? Worried About Charity fundraiser in the Last Year: Never true  ? Ran Out of Food in the Last Year: Never true  ?Transportation Needs: No Transportation Needs  ? Lack of Transportation (Medical): No  ? Lack of Transportation (Non-Medical): No  ?Physical Activity: Sufficiently Active  ? Days of Exercise per Week: 7 days  ? Minutes of Exercise per Session: 50 min  ?Stress: No Stress Concern Present  ? Feeling of Stress : Only a little  ?Social Connections: Moderately Integrated  ? Frequency of Communication with Friends and Family: More than three times a week  ? Frequency of Social Gatherings with Friends and Family: Never  ? Attends Religious Services: More than 4 times per year  ? Active Member of Clubs or Organizations: No  ? Attends Archivist Meetings: Never  ? Marital Status: Married  ?Intimate Partner Violence: Not At Risk  ? Fear of Current or Ex-Partner: No  ? Emotionally Abused: No  ? Physically Abused: No  ? Sexually Abused: No   ? ? ? ?Review of Systems: ?All other systems reviewed and are otherwise negative except as noted above. ? ?Physical Exam: ?Vitals:  ? 06/28/21 0803  ?BP: 126/68  ?Pulse: 68  ?SpO2: 99%  ?Weight: 199 lb 3.2 o

## 2021-06-28 ENCOUNTER — Ambulatory Visit (INDEPENDENT_AMBULATORY_CARE_PROVIDER_SITE_OTHER): Payer: Medicare Other | Admitting: Student

## 2021-06-28 VITALS — BP 126/68 | HR 68 | Ht 61.0 in | Wt 199.2 lb

## 2021-06-28 DIAGNOSIS — I10 Essential (primary) hypertension: Secondary | ICD-10-CM

## 2021-06-28 DIAGNOSIS — Z9889 Other specified postprocedural states: Secondary | ICD-10-CM | POA: Diagnosis not present

## 2021-06-28 DIAGNOSIS — I484 Atypical atrial flutter: Secondary | ICD-10-CM

## 2021-06-28 DIAGNOSIS — I5032 Chronic diastolic (congestive) heart failure: Secondary | ICD-10-CM

## 2021-06-28 LAB — BASIC METABOLIC PANEL
BUN/Creatinine Ratio: 8 — ABNORMAL LOW (ref 12–28)
BUN: 10 mg/dL (ref 8–27)
CO2: 20 mmol/L (ref 20–29)
Calcium: 10.1 mg/dL (ref 8.7–10.3)
Chloride: 108 mmol/L — ABNORMAL HIGH (ref 96–106)
Creatinine, Ser: 1.31 mg/dL — ABNORMAL HIGH (ref 0.57–1.00)
Glucose: 100 mg/dL — ABNORMAL HIGH (ref 70–99)
Potassium: 4.8 mmol/L (ref 3.5–5.2)
Sodium: 143 mmol/L (ref 134–144)
eGFR: 44 mL/min/{1.73_m2} — ABNORMAL LOW (ref >59)

## 2021-06-28 LAB — MAGNESIUM: Magnesium: 2 mg/dL (ref 1.6–2.3)

## 2021-06-28 NOTE — Patient Instructions (Signed)
Medication Instructions:  ?Your physician recommends that you continue on your current medications as directed. Please refer to the Current Medication list given to you today. ? ?*If you need a refill on your cardiac medications before your next appointment, please call your pharmacy* ? ? ?Lab Work: ?TODAY: BMET, [place] ? ?If you have labs (blood work) drawn today and your tests are completely normal, you will receive your results only by: ?MyChart Message (if you have MyChart) OR ?A paper copy in the mail ?If you have any lab test that is abnormal or we need to change your treatment, we will call you to review the results. ? ? ?Follow-Up: ?At [facility], you and your health needs are our priority.  As part of our continuing mission to provide you with exceptional heart care, we have created designated Provider Care Teams.  These Care Teams include your primary Cardiologist (physician) and Advanced Practice Providers (APPs -  Physician Assistants and Nurse Practitioners) who all work together to provide you with the care you need, when you need it. ? ? ?Your next appointment:   ?6 month(s) ? ?The format for your next appointment:   ?In Person ? ?Provider:   ?Cristopher Peru, MD  ? ? ?Important Information About Sugar ? ? ? ? ?  ?

## 2021-06-29 ENCOUNTER — Ambulatory Visit (HOSPITAL_COMMUNITY): Payer: Medicare Other

## 2021-06-29 ENCOUNTER — Encounter (HOSPITAL_COMMUNITY): Payer: Self-pay | Admitting: Emergency Medicine

## 2021-06-29 ENCOUNTER — Ambulatory Visit (HOSPITAL_COMMUNITY)
Admission: EM | Admit: 2021-06-29 | Discharge: 2021-06-29 | Disposition: A | Discharge status: Home / Self Care | Payer: Medicare Other | Attending: Internal Medicine | Admitting: Internal Medicine

## 2021-06-29 DIAGNOSIS — M25531 Pain in right wrist: Secondary | ICD-10-CM

## 2021-06-29 DIAGNOSIS — W19XXXA Unspecified fall, initial encounter: Secondary | ICD-10-CM | POA: Diagnosis not present

## 2021-06-29 DIAGNOSIS — S60211A Contusion of right wrist, initial encounter: Secondary | ICD-10-CM | POA: Diagnosis not present

## 2021-06-29 DIAGNOSIS — S8002XA Contusion of left knee, initial encounter: Secondary | ICD-10-CM | POA: Diagnosis not present

## 2021-06-29 DIAGNOSIS — S80212A Abrasion, left knee, initial encounter: Secondary | ICD-10-CM | POA: Diagnosis not present

## 2021-06-29 DIAGNOSIS — S8000XA Contusion of unspecified knee, initial encounter: Secondary | ICD-10-CM

## 2021-06-29 NOTE — Discharge Instructions (Addendum)
Ice areas of pain for 15 minutes 4 times today, keep the scrape clean and continue neosporin ointment. ?Wear the splint for one week. ?Follow up with your doctor in one week.  ?

## 2021-06-29 NOTE — ED Provider Notes (Addendum)
?[facility] ? ? ? ?CSN: [id number] ?Arrival date & time: 06/29/21  1336 ? ? ?  ? ?History   ?Chief Complaint ?Chief Complaint  ?Patient presents with  ? Fall  ? Knee Pain  ? Hand Pain  ? ? ?HPI ?Alexandria Leach is a 71 y.o. female who presents with bilateral knee contusion and R hand pain after falling at the gas station yesterday. She forgot there was a step down when she was walking out of the store and fell on her knees and hands. She did not hit her head or have. Someone helped her get up, and when home, cleaned the abrasion on her L knee, and went to bed. This am, her R wrist still hurts specially when she tried to left something. Feels shooting pain from her proximal thumb to her medial wrist.  ?She has not taken anything for pain ? ? ?Past Medical History:  ?Diagnosis Date  ? Ankle fracture, left 02/19/2015  ? from a fall  ? Anxiety   ? Cataract 2014  ? corrected with surgery  ? CHF (congestive heart failure) ([place])   ? CKD (chronic kidney disease) stage 3, GFR 30-59 ml/min (HCC)   ? saw nephrologist Dr Harden Mo  ? DDD (degenerative disc disease), cervical   ? Depression   ? Fibromyalgia   ? Glaucoma   ? s/p surgery, sees ophtho Q6 mo  ? History of DVT (deep vein thrombosis) several times latest 2012  ? receives coumadin while hospitalized  ? History of kidney stones 2010  ? History of pulmonary embolism 2001, 2006  ? completed coumadin courses  ? History of rheumatic fever x3  ? HLD (hyperlipidemia)   ? HTN (hypertension)   ? Insomnia   ? Lung nodule 09/22/2012  ? RLL - 64m, stable since 2014. Thought benign.   ? Osteoarthritis   ? shoulders and knees, not RA per Dr DEstanislado Pandy positive ANA, positive Ro  ? Osteopenia 09/18/2015  ? DEXA T -1.1 hip, -0.2 spine 08/2015   ? Personal history of urinary calculi latest 2014  ? Pneumonia 12/02/2011  ? PONV (postoperative nausea and vomiting)   ? Refusal of blood transfusions as patient is Jehovah's Witness   ? Rheumatic heart disease 1980  ? s/p mitral valve  repair 1980  ? Sjogren's syndrome ([facility]   ? Trimalleolar fracture of left ankle 02/23/2015  ? ? ?Patient Active Problem List  ? Diagnosis Date Noted  ? Chronic GERD 01/30/2021  ? History of adenomatous polyp of colon 01/30/2021  ? Left sided abdominal pain 01/30/2021  ? Vitamin B1 deficiency 01/18/2021  ? Low serum vitamin B12 01/13/2021  ? Secondary hypercoagulable state ([place] 10/23/2020  ? Prediabetes 07/11/2020  ? Snoring 06/23/2020  ? Atrial flutter ([place] 06/23/2020  ? Dysphagia 05/15/2020  ? Secondary hyperparathyroidism of renal origin ([place] 12/28/2019  ? Constipation 12/28/2019  ? Sinus tachycardia 05/27/2018  ? Transient alteration of awareness 04/04/2018  ? Abnormal serum thyroid stimulating hormone (TSH) level 01/30/2018  ? 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[place] 12/22/2017  ? Thyroid nodule 09/16/2017  ? Neck pain on right side 05/14/2017  ? Sialoadenitis of submandibular gland 10/29/2016  ? High risk medication use 06/05/2016  ? Peripheral neuropathy 01/30/2016  ? DNR no code (do not resuscitate) 12/08/2015  ? 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  ? TIA (transient ischemic attack) 10/12/2015  ? Mild mitral regurgitation 10/12/2015  ? Osteopenia 09/18/2015  ? Right arm pain 08/29/2015  ? History of fall   ?  Right sided sciatica 02/21/2015  ? Imbalance 01/12/2015  ? Transaminitis 12/24/2014  ? DDD (degenerative disc disease), cervical   ? Health maintenance examination 10/18/2014  ? Advanced care planning/counseling discussion 10/18/2014  ? Medicare annual wellness visit, subsequent 10/18/2014  ? Refusal of blood transfusions as patient is Jehovah's Witness   ? Sjogren's syndrome ([place])   ? CKD (chronic kidney disease) stage 3, GFR 30-59 ml/min (HCC)   ? Glaucoma   ? Fibromyalgia   ? Osteoarthritis   ? History of pulmonary embolism   ? Lung nodule 09/22/2012  ? (HFpEF) heart failure with preserved ejection fraction ([place]) 12/04/2011  ? Aortic stenosis 04/22/2011  ? Palpitations 08/10/2010  ? HOT  FLASHES 06/28/2008  ? ARTHRALGIA 07/01/2007  ? BACK PAIN, LEFT 08/25/2006  ? GAD (generalized anxiety disorder) 03/28/2006  ? History of mitral valve repair 03/28/2006  ? History of total abdominal hysterectomy 03/28/2006  ? HYPERCHOLESTEROLEMIA 12/31/2005  ? MDD (major depressive disorder), recurrent episode ([place]) 12/31/2005  ? RHEUMATIC HEART DISEASE 12/31/2005  ? Essential hypertension 12/31/2005  ? Chronic insomnia 12/31/2005  ? ? ?Past Surgical History:  ?Procedure Laterality Date  ? BREAST BIOPSY Right 2006  ? benign  ? CARDIOVERSION N/A 07/18/2020  ? Procedure: CARDIOVERSION;  Surgeon: Elouise Munroe, MD;  Location: [facility];  Service: Cardiovascular;  Laterality: N/A;  ? CHOLECYSTECTOMY  11/27/2011  ? Procedure: LAPAROSCOPIC CHOLECYSTECTOMY WITH INTRAOPERATIVE CHOLANGIOGRAM;  Surgeon: Adin Hector, MD;  Location: [facility];  Service: General;  Laterality: N/A;  laparoscopic cholecystectomy with choleangiogram umbilical hernia repair  ? COLONOSCOPY  07/2014  ? WNL Amedeo Plenty)  ? COLONOSCOPY  08/2019  ? 3 TAs, rpt 3 yrs ([place])  ? dexa  08/2012  ? normal per patient - no records available  ? ESOPHAGOGASTRODUODENOSCOPY  08/2019  ? reactive gastropathy, neg H pylori ([place])  ? EYE SURGERY Bilateral 2014  ? cataract removal  ? MITRAL VALVE REPAIR  1980  ? open heart  ? ORIF ANKLE FRACTURE Left 02/26/2015  ? Procedure: OPEN REDUCTION INTERNAL FIXATION (ORIF) LEFT TRIMALLEOLAR ANKLE FRACTURE;  Surgeon: [name], MD;  Location: [facility];  Service: Orthopedics;  Laterality: Left;  ? TUBAL LIGATION  1980  ? UMBILICAL HERNIA REPAIR  11/27/2011  ? Procedure: HERNIA REPAIR UMBILICAL ADULT;  Surgeon: Adin Hector, MD;  Location: [facility];  Service: General;  Laterality: N/A;  ? [place]  ? for fibroids -- partial, ovaries remain  ? ? ?OB History   ?No obstetric history on file. ?  ? ? ? ?Home Medications   ? ?Prior to Admission medications   ?Medication Sig Start Date End Date Taking?  Authorizing Provider  ?ALPRAZolam (XANAX) 0.5 MG tablet Take 1 tablet (0.5 mg total) by mouth 3 (three) times daily as needed for anxiety. 05/22/21 05/22/22  Cloria Spring, MD  ?amoxicillin (AMOXIL) 500 MG tablet Take 4 tablets ('2000mg'$ ) by mouth 30-60 min prior to dental procedure. 03/16/21   Lelon Perla, MD  ?antiseptic oral rinse (BIOTENE) LIQD 15 mLs by Mouth Rinse route 2 (two) times daily as needed for dry mouth.    [provider]  ?cholecalciferol (VITAMIN D) 1000 UNITS tablet Take 1,000 Units by mouth daily.    [provider]  ?co-enzyme Q-10 30 MG capsule Take 30 mg by mouth daily.    [provider]  ?diltiazem (CARDIZEM CD) 360 MG 24 hr capsule TAKE 1 CAPSULE BY MOUTH  DAILY 11/01/20   Lelon Perla, MD  ?  docusate sodium (COLACE) 100 MG capsule Take 100 mg by mouth daily.    [provider]  ?dofetilide (TIKOSYN) 250 MCG capsule Take 1 capsule (250 mcg total) by mouth 2 (two) times daily. 06/13/21   Evans Lance, MD  ?Arne [place] 5 MG TABS tablet TAKE 1 TABLET BY MOUTH  TWICE DAILY 02/22/21   Sherran Needs, NP  ?ezetimibe (ZETIA) 10 MG tablet TAKE 1 TABLET BY MOUTH AT  BEDTIME 02/05/21   Ria Bush, MD  ?furosemide (LASIX) 20 MG tablet TAKE 1 TABLET BY MOUTH  DAILY AS NEEDED FOR EDEMA 09/07/19   Ria Bush, MD  ?gabapentin (NEURONTIN) 300 MG capsule TAKE 2 CAPSULES BY MOUTH IN THE MORNING , 1 CAPSULE IN  THE AFTERNOON AND 2  CAPSULES AT BEDTIME 01/05/21   Ria Bush, MD  ?losartan (COZAAR) 50 MG tablet TAKE 1 TABLET BY MOUTH  DAILY 03/30/21   Lelon Perla, MD  ?lovastatin (MEVACOR) 10 MG tablet TAKE 1 TABLET BY MOUTH  EVERY [place], [place],  AND FRIDAY 02/05/21   Ria Bush, MD  ?metoprolol tartrate (LOPRESSOR) 50 MG tablet TAKE 1 AND 1/2 TABLETS BY  MOUTH TWICE DAILY 06/25/21   Lelon Perla, MD  ?pantoprazole (PROTONIX) 40 MG tablet Take 40 mg by mouth daily. 02/16/20   [provider]  ?Polyethyl Glycol-Propyl  Glycol (SYSTANE) 0.4-0.3 % GEL ophthalmic gel Place 1 drop into both eyes 2 (two) times daily.     [provider]  ?potassium chloride (KLOR-CON) 10 MEQ tablet Take 1 tablet (10 mEq total) by mouth daily. 1

## 2021-06-29 NOTE — ED Triage Notes (Addendum)
Pt reports bilateral knee and right hand pain after a fall yesterday. Denies any obvious injuries, LOC, or hitting head.  ?

## 2021-07-02 ENCOUNTER — Other Ambulatory Visit: Payer: Self-pay | Admitting: Cardiology

## 2021-07-11 ENCOUNTER — Encounter: Payer: Self-pay | Admitting: Family Medicine

## 2021-07-11 ENCOUNTER — Ambulatory Visit (INDEPENDENT_AMBULATORY_CARE_PROVIDER_SITE_OTHER): Payer: Medicare Other | Admitting: Family Medicine

## 2021-07-11 VITALS — BP 126/82 | HR 72 | Temp 97.4°F | Ht 61.0 in | Wt 197.1 lb

## 2021-07-11 DIAGNOSIS — I484 Atypical atrial flutter: Secondary | ICD-10-CM | POA: Diagnosis not present

## 2021-07-11 DIAGNOSIS — M3501 Sicca syndrome with keratoconjunctivitis: Secondary | ICD-10-CM | POA: Diagnosis not present

## 2021-07-11 DIAGNOSIS — M85852 Other specified disorders of bone density and structure, left thigh: Secondary | ICD-10-CM | POA: Diagnosis not present

## 2021-07-11 DIAGNOSIS — M25561 Pain in right knee: Secondary | ICD-10-CM | POA: Diagnosis not present

## 2021-07-11 DIAGNOSIS — I350 Nonrheumatic aortic (valve) stenosis: Secondary | ICD-10-CM | POA: Diagnosis not present

## 2021-07-11 DIAGNOSIS — Z9889 Other specified postprocedural states: Secondary | ICD-10-CM

## 2021-07-11 DIAGNOSIS — M25562 Pain in left knee: Secondary | ICD-10-CM

## 2021-07-11 DIAGNOSIS — I5032 Chronic diastolic (congestive) heart failure: Secondary | ICD-10-CM

## 2021-07-11 DIAGNOSIS — M1612 Unilateral primary osteoarthritis, left hip: Secondary | ICD-10-CM

## 2021-07-11 DIAGNOSIS — W19XXXA Unspecified fall, initial encounter: Secondary | ICD-10-CM | POA: Diagnosis not present

## 2021-07-11 DIAGNOSIS — N1832 Chronic kidney disease, stage 3b: Secondary | ICD-10-CM

## 2021-07-11 DIAGNOSIS — G8929 Other chronic pain: Secondary | ICD-10-CM | POA: Insufficient documentation

## 2021-07-11 MED ORDER — FUROSEMIDE 20 MG PO TABS: ORAL_TABLET | ORAL | 1 refills | Status: DC

## 2021-07-11 NOTE — Assessment & Plan Note (Signed)
Recent BMP reviewed- stable chronic kidney disease stage III. ?

## 2021-07-11 NOTE — Assessment & Plan Note (Signed)
Appreciate rheumatology care. ?

## 2021-07-11 NOTE — Assessment & Plan Note (Signed)
Regularly sees cardiology and A-fib clinic. ?

## 2021-07-11 NOTE — Patient Instructions (Addendum)
You are doing well today  ?I think you have bursitis on front of both knees after recent fall. Use leg elevation, heating pad to knees, may use topical voltaren gel 2-3 times a day. Let us know if knees not improving each day, or if worsening redness/swelling/warmth or pain of left anterior knee cap.  ?Continue current medicines, return in 6 months for wellness visit/physical.  ?

## 2021-07-11 NOTE — Assessment & Plan Note (Signed)
Upcoming density scan scheduled next week.  We will reach out to patient with results. ?

## 2021-07-11 NOTE — Assessment & Plan Note (Signed)
Followed by A-fib clinic on Tikosyn Eliquis diltiazem and metoprolol. ?

## 2021-07-11 NOTE — Progress Notes (Signed)
? ? Patient ID: Alexandria Leach, female    DOB: 16-Mar-1951, 71 y.o.   MRN: [id number] ? ?This visit was conducted in person. ? ?BP 126/82   Pulse 72   Temp (!) 97.4 ?F (36.3 ?C) (Temporal)   Ht '5\' 1"'  (1.549 m)   Wt 197 lb 2 oz (89.4 kg)   SpO2 98%   BMI 37.25 kg/m?   ? ?CC: 6 mo f/u visit  ?Subjective:  ? ?HPI: ?Alexandria Leach is a 71 y.o. female presenting on 07/11/2021 for Follow-up (Here for 6 mo f/u.) ? ? ?Known chronic HFpEF with mitral stenosis/regurg s/p repair rec SBE ppx. ?Sees rheum for sjogren's with keratoconjunctivitis sicca (ANA+, Ro+) and FM.  ? ?Seen [facility] 2 wks ago after fall at gas station with resultant bilateral knee contusions and R hand pain. Reassuring R hand xray at [facility]. Hand feels better, but knees still painful. S/p abrasion and bruising. Walking ok but ongoing ache when sitting and resting.  ? ?Atypical atrial flutter followed by A-fib clinic on Tikosyn therapy.  Also on Eliquis, diltiazem and Lopressor. ? ?Osteopenia - updated DEXA scan scheduled for next week. She regularly takes vitamin D 1000 IU daily, as well as yogurt daily. She also regularly walks.  ?   ? ?Relevant past medical, surgical, family and social history reviewed and updated as indicated. Interim medical history since our last visit reviewed. ?Allergies and medications reviewed and updated. ?Outpatient Medications Prior to Visit  ?Medication Sig Dispense Refill  ? ALPRAZolam (XANAX) 0.5 MG tablet Take 1 tablet (0.5 mg total) by mouth 3 (three) times daily as needed for anxiety. 270 tablet 2  ? amoxicillin (AMOXIL) 500 MG tablet Take 4 tablets (206m) by mouth 30-60 min prior to dental procedure. 4 tablet 5  ? antiseptic oral rinse (BIOTENE) LIQD 15 mLs by Mouth Rinse route 2 (two) times daily as needed for dry mouth.    ? cholecalciferol (VITAMIN D) 1000 UNITS tablet Take 1,000 Units by mouth daily.    ? co-enzyme Q-10 30 MG capsule Take 30 mg by mouth daily.    ? diltiazem (CARDIZEM CD) 360 MG 24 hr  capsule TAKE 1 CAPSULE BY MOUTH  DAILY 100 capsule 2  ? docusate sodium (COLACE) 100 MG capsule Take 100 mg by mouth daily.    ? dofetilide (TIKOSYN) 250 MCG capsule Take 1 capsule (250 mcg total) by mouth 2 (two) times daily. 60 capsule 6  ? ELIQUIS 5 MG TABS tablet TAKE 1 TABLET BY MOUTH  TWICE DAILY 180 tablet 3  ? ezetimibe (ZETIA) 10 MG tablet TAKE 1 TABLET BY MOUTH AT  BEDTIME 90 tablet 3  ? gabapentin (NEURONTIN) 300 MG capsule TAKE 2 CAPSULES BY MOUTH IN THE MORNING , 1 CAPSULE IN  THE AFTERNOON AND 2  CAPSULES AT BEDTIME 450 capsule 3  ? losartan (COZAAR) 50 MG tablet TAKE 1 TABLET BY MOUTH  DAILY 90 tablet 3  ? lovastatin (MEVACOR) 10 MG tablet TAKE 1 TABLET BY MOUTH  EVERY MONDAY, WEDNESDAY,  AND FRIDAY 39 tablet 3  ? metoprolol tartrate (LOPRESSOR) 50 MG tablet TAKE 1 AND 1/2 TABLETS BY  MOUTH TWICE DAILY 270 tablet 3  ? pantoprazole (PROTONIX) 40 MG tablet Take 40 mg by mouth daily.    ? Polyethyl Glycol-Propyl Glycol (SYSTANE) 0.4-0.3 % GEL ophthalmic gel Place 1 drop into both eyes 2 (two) times daily.     ? RESTASIS 0.05 % ophthalmic emulsion INSTILL ONE DROP IN BOTH  EYES  TWO TIMES DAILY 180 each 0  ? temazepam (RESTORIL) 30 MG capsule Take 1 capsule (30 mg total) by mouth at bedtime as needed for sleep. 30 capsule 2  ? traMADol (ULTRAM) 50 MG tablet Take 1 tablet (50 mg total) by mouth daily as needed. (Patient taking differently: Take 50 mg by mouth daily as needed. Rare use, severe pain (10/10)) 30 tablet 0  ? vitamin B-12 (CYANOCOBALAMIN) 1000 MCG tablet Take 1 tablet (1,000 mcg total) by mouth every Monday, Wednesday, and Friday.    ? furosemide (LASIX) 20 MG tablet TAKE 1 TABLET BY MOUTH  DAILY AS NEEDED FOR EDEMA 90 tablet 3  ? potassium chloride (KLOR-CON) 10 MEQ tablet Take 1 tablet (10 mEq total) by mouth daily. 90 tablet 3  ? ?No facility-administered medications prior to visit.  ?  ? ?Per HPI unless specifically indicated in ROS section below ?Review of Systems ? ?Objective:  ?BP 126/82    Pulse 72   Temp (!) 97.4 ?F (36.3 ?C) (Temporal)   Ht '5\' 1"'  (1.549 m)   Wt 197 lb 2 oz (89.4 kg)   SpO2 98%   BMI 37.25 kg/m?   ?Wt Readings from Last 3 Encounters:  ?07/11/21 197 lb 2 oz (89.4 kg)  ?06/29/21 199 lb 3.2 oz (90.4 kg)  ?06/28/21 199 lb 3.2 oz (90.4 kg)  ?  ?  ?Physical Exam ?Vitals and nursing note reviewed.  ?Constitutional:   ?   Appearance: Normal appearance. She is obese. She is not ill-appearing.  ?   Comments: Ambulates with cane  ?Cardiovascular:  ?   Rate and Rhythm: Normal rate and regular rhythm.  ?   Pulses: Normal pulses.  ?   Heart sounds: Murmur (2/6 systolic) heard.  ?Pulmonary:  ?   Effort: Pulmonary effort is normal. No respiratory distress.  ?   Breath sounds: Normal breath sounds. No wheezing, rhonchi or rales.  ?Musculoskeletal:  ?   Right lower leg: Edema (tr) present.  ?   Left lower leg: Edema (tr) present.  ?   Comments:  ?Bilateral knees full range of motion in flexion extension with some tenderness. ?Negative drawer test bilaterally, no pain with valgus and varus testing. ?Right knee tender to palpation along external joint as well as the pes anserine bursa. ?Left knee tender to palpation with some swelling overlying patella. ?No erythema or redness or warmth skin.  ?Skin: ?   General: Skin is warm and dry.  ?   Findings: No erythema or rash.  ?Neurological:  ?   Mental Status: She is alert.  ?   Comments: Wide-based antalgic gait  ?Psychiatric:     ?   Mood and Affect: Mood normal.  ? ?   ?Results for orders placed or performed in visit on 06/28/21  ?Basic metabolic panel  ?Result Value Ref Range  ? Glucose 100 (H) 70 - 99 mg/dL  ? BUN 10 8 - 27 mg/dL  ? Creatinine, Ser 1.31 (H) 0.57 - 1.00 mg/dL  ? eGFR 44 (L) >59 mL/min/1.73  ? BUN/Creatinine Ratio 8 (L) 12 - 28  ? Sodium 143 134 - 144 mmol/L  ? Potassium 4.8 3.5 - 5.2 mmol/L  ? Chloride 108 (H) 96 - 106 mmol/L  ? CO2 20 20 - 29 mmol/L  ? Calcium 10.1 8.7 - 10.3 mg/dL  ?Magnesium  ?Result Value Ref Range  ?  Magnesium 2.0 1.6 - 2.3 mg/dL  ? ? ?Assessment & Plan:  ? ?Problem List Items Addressed This  Visit   ? ? History of mitral valve repair  ? Aortic stenosis  ? Relevant Medications  ? furosemide (LASIX) 20 MG tablet  ? (HFpEF) heart failure with preserved ejection fraction ([place])  ?  Regularly sees cardiology and A-fib clinic. ? ?  ?  ? Relevant Medications  ? furosemide (LASIX) 20 MG tablet  ? Sjogren's syndrome ([place])  ?  Appreciate rheumatology care. ? ?  ?  ? CKD (chronic kidney disease) stage 3, GFR 30-59 ml/min (HCC)  ?  Recent BMP reviewed- stable chronic kidney disease stage III. ? ?  ?  ? Osteoarthritis  ? Osteopenia  ?  Upcoming density scan scheduled next week.  We will reach out to patient with results. ? ?  ?  ? Atrial flutter ([place])  ?  Followed by A-fib clinic on Tikosyn Eliquis diltiazem and metoprolol. ? ?  ?  ? Relevant Medications  ? furosemide (LASIX) 20 MG tablet  ? Acute bilateral knee pain - Primary  ?  Acute bilateral knee pain after fall to knees 2 weeks ago. ?Anticipate right Pes anserine bursitis as well as left suprapatellar bursitis. ?He also has known history of L knee osteoarthritis status post previous steroid injection by rheumatology. ?I did recommend leg elevation, heating pad with precautions, topical Voltaren gel.  Avoiding NSAIDs in chronic kidney disease and Eliquis use.  Update if not improving with this treatment. ? ?  ?  ? ?Other Visit Diagnoses   ? ? Injury due to fall, initial encounter      ? ?  ?  ? ?Meds ordered this encounter  ?Medications  ? furosemide (LASIX) 20 MG tablet  ?  Sig: TAKE 1 TABLET BY MOUTH  DAILY AS NEEDED FOR EDEMA  ?  Dispense:  90 tablet  ?  Refill:  1  ? ?No orders of the defined types were placed in this encounter. ? ? ? ?Patient Instructions  ?You are doing well today  ?I think you have bursitis on front of both knees after recent fall. Use leg elevation, heating pad to knees, may use topical voltaren gel 2-3 times a day. Let us know if knees not  improving each day, or if worsening redness/swelling/warmth or pain of left anterior knee cap.  ?Continue current medicines, return in 6 months for wellness visit/physical.  ? ?Follow up plan: ?Return in about 6 months (around

## 2021-07-11 NOTE — Assessment & Plan Note (Signed)
Acute bilateral knee pain after fall to knees 2 weeks ago. ?Anticipate right Pes anserine bursitis as well as left suprapatellar bursitis. ?He also has known history of L knee osteoarthritis status post previous steroid injection by rheumatology. ?I did recommend leg elevation, heating pad with precautions, topical Voltaren gel.  Avoiding NSAIDs in chronic kidney disease and Eliquis use.  Update if not improving with this treatment. ?

## 2021-07-16 ENCOUNTER — Ambulatory Visit
Admission: RE | Admit: 2021-07-16 | Discharge: 2021-07-16 | Disposition: A | Discharge status: Home / Self Care | Payer: Medicare Other | Source: Ambulatory Visit | Attending: Family Medicine | Admitting: Family Medicine

## 2021-07-16 DIAGNOSIS — Z780 Asymptomatic menopausal state: Secondary | ICD-10-CM | POA: Diagnosis not present

## 2021-07-16 DIAGNOSIS — M85852 Other specified disorders of bone density and structure, left thigh: Secondary | ICD-10-CM | POA: Diagnosis not present

## 2021-08-03 DIAGNOSIS — H52203 Unspecified astigmatism, bilateral: Secondary | ICD-10-CM | POA: Diagnosis not present

## 2021-08-03 DIAGNOSIS — H25813 Combined forms of age-related cataract, bilateral: Secondary | ICD-10-CM | POA: Diagnosis not present

## 2021-08-03 DIAGNOSIS — H16221 Keratoconjunctivitis sicca, not specified as Sjogren's, right eye: Secondary | ICD-10-CM | POA: Diagnosis not present

## 2021-08-06 ENCOUNTER — Encounter: Payer: Self-pay | Admitting: Cardiology

## 2021-08-06 DIAGNOSIS — I5032 Chronic diastolic (congestive) heart failure: Secondary | ICD-10-CM

## 2021-08-06 MED ORDER — FUROSEMIDE 20 MG PO TABS: 60.0000 mg | ORAL_TABLET | Freq: Every day | ORAL | 3 refills | Status: DC

## 2021-08-13 ENCOUNTER — Other Ambulatory Visit: Payer: Self-pay | Admitting: Physician Assistant

## 2021-08-14 DIAGNOSIS — I5032 Chronic diastolic (congestive) heart failure: Secondary | ICD-10-CM | POA: Diagnosis not present

## 2021-08-14 LAB — BASIC METABOLIC PANEL
BUN/Creatinine Ratio: 11 — ABNORMAL LOW (ref 12–28)
BUN: 15 mg/dL (ref 8–27)
CO2: 20 mmol/L (ref 20–29)
Calcium: 9.7 mg/dL (ref 8.7–10.3)
Chloride: 105 mmol/L (ref 96–106)
Creatinine, Ser: 1.35 mg/dL — ABNORMAL HIGH (ref 0.57–1.00)
Glucose: 102 mg/dL — ABNORMAL HIGH (ref 70–99)
Potassium: 4.2 mmol/L (ref 3.5–5.2)
Sodium: 140 mmol/L (ref 134–144)
eGFR: 43 mL/min/{1.73_m2} — ABNORMAL LOW (ref >59)

## 2021-08-14 NOTE — Telephone Encounter (Signed)
Next Visit: 10/11/2021  Last Visit: 05/14/2021  UDS:05/14/2021 UDS negative for tramadol-the patient takes tramadol very sparingly for pain relief  Narc Agreement: 05/14/2021  Last Fill: 01/16/2021  Okay to refill Tramadol?

## 2021-08-21 ENCOUNTER — Other Ambulatory Visit: Payer: Self-pay | Admitting: Family Medicine

## 2021-08-21 ENCOUNTER — Encounter: Payer: Self-pay | Admitting: Family Medicine

## 2021-08-21 ENCOUNTER — Ambulatory Visit (INDEPENDENT_AMBULATORY_CARE_PROVIDER_SITE_OTHER)
Admission: RE | Admit: 2021-08-21 | Discharge: 2021-08-21 | Disposition: A | Discharge status: Home / Self Care | Payer: Medicare Other | Source: Ambulatory Visit | Attending: Family Medicine | Admitting: Family Medicine

## 2021-08-21 ENCOUNTER — Ambulatory Visit (INDEPENDENT_AMBULATORY_CARE_PROVIDER_SITE_OTHER): Payer: Medicare Other | Admitting: Family Medicine

## 2021-08-21 VITALS — BP 124/70 | HR 60 | Temp 97.0°F | Ht 61.0 in | Wt 198.1 lb

## 2021-08-21 DIAGNOSIS — R600 Localized edema: Secondary | ICD-10-CM

## 2021-08-21 DIAGNOSIS — G8929 Other chronic pain: Secondary | ICD-10-CM

## 2021-08-21 DIAGNOSIS — K1120 Sialoadenitis, unspecified: Secondary | ICD-10-CM

## 2021-08-21 DIAGNOSIS — G6289 Other specified polyneuropathies: Secondary | ICD-10-CM

## 2021-08-21 DIAGNOSIS — M25561 Pain in right knee: Secondary | ICD-10-CM

## 2021-08-21 DIAGNOSIS — I5032 Chronic diastolic (congestive) heart failure: Secondary | ICD-10-CM

## 2021-08-21 DIAGNOSIS — M3501 Sicca syndrome with keratoconjunctivitis: Secondary | ICD-10-CM | POA: Diagnosis not present

## 2021-08-21 NOTE — Progress Notes (Unsigned)
Patient ID: Alexandria Leach, female    DOB: [birth year], 71 y.o.   MRN: [id number]  This visit was conducted in person.  BP 124/70   Pulse 60   Temp (!) 97 F (36.1 C) (Temporal)   Ht '5\' 1"'  (1.549 m)   Wt 198 lb 2 oz (89.9 kg)   SpO2 98%   BMI 37.44 kg/m    CC: check knees  Subjective:   HPI: Alexandria Leach is a 71 y.o. female presenting on 08/21/2021 for Knee Pain (C/o ongoing bilateral knee pain. Worsening, especially R knee.  Also, c/o swelling- Lasix not helping. )   Known chronic HFpEF with mitral stenosis/regurg s/p repair rec SBE ppx. Recent lasix dose increased to 39m daily due to worsening pedal edema ([place]). Atypical atrial flutter followed by Afib clinic on Tikosyn, eliquis, dilt and metoprolol.   Sees rheum for sjogren's with keratoconjunctivitis sicca (ANA+, Ro+) and FM.  Fall at gas station 06/28/2021 with bilateral knee contusions s/p [facility] eval. Seen in f/u 07/11/2021 - thought R pes anserine bursitis as well as L suprapatellar bursitis. At that time recommended heating pad, topical voltaren. Avoiding oral NSAIDs in CKD and eliquis use.  Known h/o L knee OA s/p previous steroid injection by rheumatology remotely.   Notes persistent R knee pain, ongoing lateral R knee pain worse when laying on it at night. Only mild improvement since fall. Notes persistent lump to the L anterior knee overlying kneecap - no pain with this. Also notes bilateral dependent foot swelling worse as day progresses. Swelling stays at feet. R>L foot swelling. Higher lasix hasn't really helped. No hand swelling or edema elsewhere.   H/o L ankle fracture s/p repair with residual hardware (screws/pin).   Has had steroid injections into knees by rheumatology, none recently.  Periph neuropathy - on gabapentin. Amitriptyline was stopped due to Tikosyn interaction.   Chronic tender L neck swelling previously attributed to of submandibular sialoadenitis in setting of Sjogren. Notes  ongoing discomfort and swelling. Neck [place] showed enlarged R submandibular salivary gland without LAD, mass, abscess.  Known h/o thyroid nodules on left - last [place]2021 without f/u needed at that time.      Relevant past medical, surgical, family and social history reviewed and updated as indicated. Interim medical history since our last visit reviewed. Allergies and medications reviewed and updated. Outpatient Medications Prior to Visit  Medication Sig Dispense Refill   ALPRAZolam (XANAX) 0.5 MG tablet Take 1 tablet (0.5 mg total) by mouth 3 (three) times daily as needed for anxiety. 270 tablet 2   amoxicillin (AMOXIL) 500 MG tablet Take 4 tablets (20035m by mouth 30-60 min prior to dental procedure. 4 tablet 5   antiseptic oral rinse (BIOTENE) LIQD 15 mLs by Mouth Rinse route 2 (two) times daily as needed for dry mouth.     cholecalciferol (VITAMIN D) 1000 UNITS tablet Take 1,000 Units by mouth daily.     co-enzyme Q-10 30 MG capsule Take 30 mg by mouth daily.     diltiazem (CARDIZEM CD) 360 MG 24 hr capsule TAKE 1 CAPSULE BY MOUTH  DAILY 100 capsule 2   docusate sodium (COLACE) 100 MG capsule Take 100 mg by mouth daily.     dofetilide (TIKOSYN) 250 MCG capsule Take 1 capsule (250 mcg total) by mouth 2 (two) times daily. 60 capsule 6   ELIQUIS 5 MG TABS tablet TAKE 1 TABLET BY MOUTH  TWICE DAILY 180 tablet 3   ezetimibe (  ZETIA) 10 MG tablet TAKE 1 TABLET BY MOUTH AT  BEDTIME 90 tablet 3   furosemide (LASIX) 20 MG tablet Take 3 tablets (60 mg total) by mouth daily. 270 tablet 3   gabapentin (NEURONTIN) 300 MG capsule TAKE 2 CAPSULES BY MOUTH IN THE MORNING , 1 CAPSULE IN  THE AFTERNOON AND 2  CAPSULES AT BEDTIME 450 capsule 3   losartan (COZAAR) 50 MG tablet TAKE 1 TABLET BY MOUTH  DAILY 90 tablet 3   lovastatin (MEVACOR) 10 MG tablet TAKE 1 TABLET BY MOUTH  EVERY MONDAY, WEDNESDAY,  AND FRIDAY 39 tablet 3   metoprolol tartrate (LOPRESSOR) 50 MG tablet TAKE 1 AND 1/2 TABLETS BY  MOUTH  TWICE DAILY 270 tablet 3   pantoprazole (PROTONIX) 40 MG tablet Take 40 mg by mouth daily.     Polyethyl Glycol-Propyl Glycol (SYSTANE) 0.4-0.3 % GEL ophthalmic gel Place 1 drop into both eyes 2 (two) times daily.      RESTASIS 0.05 % ophthalmic emulsion INSTILL ONE DROP IN BOTH  EYES TWO TIMES DAILY 180 each 0   temazepam (RESTORIL) 30 MG capsule Take 1 capsule (30 mg total) by mouth at bedtime as needed for sleep. 30 capsule 2   traMADol (ULTRAM) 50 MG tablet TAKE 1 TABLET BY MOUTH DAILY AS  NEEDED 30 tablet 0   vitamin B-12 (CYANOCOBALAMIN) 1000 MCG tablet Take 1 tablet (1,000 mcg total) by mouth every Monday, Wednesday, and Friday.     potassium chloride (KLOR-CON) 10 MEQ tablet Take 1 tablet (10 mEq total) by mouth daily. 90 tablet 3   No facility-administered medications prior to visit.     Per HPI unless specifically indicated in ROS section below Review of Systems  Objective:  BP 124/70   Pulse 60   Temp (!) 97 F (36.1 C) (Temporal)   Ht '5\' 1"'  (1.549 m)   Wt 198 lb 2 oz (89.9 kg)   SpO2 98%   BMI 37.44 kg/m   Wt Readings from Last 3 Encounters:  08/21/21 198 lb 2 oz (89.9 kg)  07/11/21 197 lb 2 oz (89.4 kg)  06/29/21 199 lb 3.2 oz (90.4 kg)      Physical Exam Vitals and nursing note reviewed.  Constitutional:      Appearance: Normal appearance. She is not ill-appearing.  Neck:     Thyroid: Thyromegaly (L sided - known nodules) present. No thyroid tenderness.      Comments: Tender to palpation L submandibular area Musculoskeletal:        General: Swelling and tenderness present.     Cervical back: Normal range of motion and neck supple.     Right lower leg: Edema (tr) present.     Left lower leg: Edema (1+ to ankle) present.     Comments:  L knee - firm nontender swelling at suprapatellar bursa R knee -  Swelling/effusion pressent  Discomfort with palpation of anterolateral knee. FROM in flex/extension without significant crepitus. No popliteal fullness. Neg  drawer test. Neg mcmurray test. No pain with valgus/varus stress. No PFgrind. No abnormal patellar mobility.   R ankle swelling present without pain with ligament testing or calc squeeze test 2+ DP bilaterally  Lymphadenopathy:     Cervical: No cervical adenopathy.  Skin:    General: Skin is warm and dry.     Findings: No erythema or rash.  Neurological:     Mental Status: She is alert.  Psychiatric:        Mood and Affect: Mood  normal.        Behavior: Behavior normal.      Results for orders placed or performed in visit on 26/20/35  Basic Metabolic Panel (BMET)  Result Value Ref Range   Glucose 102 (H) 70 - 99 mg/dL   BUN 15 8 - 27 mg/dL   Creatinine, Ser 1.35 (H) 0.57 - 1.00 mg/dL   eGFR 43 (L) >59 mL/min/1.73   BUN/Creatinine Ratio 11 (L) 12 - 28   Sodium 140 134 - 144 mmol/L   Potassium 4.2 3.5 - 5.2 mmol/L   Chloride 105 96 - 106 mmol/L   CO2 20 20 - 29 mmol/L   Calcium 9.7 8.7 - 10.3 mg/dL   Lab Results  Component Value Date   TSH 1.49 01/12/2021    DG Knee 4 Views W/Patella Right CLINICAL DATA:  Right knee pain  EXAM: RIGHT KNEE - COMPLETE 4+ VIEW  COMPARISON:  None Available.  FINDINGS: No acute fracture or dislocation identified. Mild tricompartmental joint space narrowing with small osteophytes. Evidence of chondrocalcinosis in the lateral compartment. Small joint effusion.  IMPRESSION: Degenerative changes and small joint effusion.  Electronically Signed   By: Ofilia Neas M.D.   On: 08/21/2021 13:56   Assessment & Plan:   Problem List Items Addressed This Visit     (HFpEF) heart failure with preserved ejection fraction (HCC)    Seems euvolemic - doubt noted pedal edema related to CHF.        Sjogren's syndrome ([employer])    Followed by rheumatology. Anticipate chronic tender left submandibular swelling due to inflamed salivary gland.       Peripheral neuropathy    Sjogren related vs idiopathic.  Stable period on gabapentin.  Amitriptyline was stopped given interaction with Tikosyn.        Sialoadenitis of submandibular gland    H/o this on right by [place] 2019.  Anticipate recurrent L sided symptoms causing discomfort.  Discussed rheum f/u if worsening.        Chronic pain of right knee - Primary    L knee pain has largely resolved, but notes persistent R anterior knee pain associated with R knee and R>L foot swelling. Anticipate effusion present. Increased lasix dose hasn't really helped with this. Anticipate osteoathritis exacerbation triggered by fall 2 months ago. rec supportive care with knee brace, continue topical voltaren, PRN tramadol. Will check new baseline R knee xray and offered referral to Dr Copland vs ortho surgery to consider steroid injection vs further surgical treatment - she declines at this time.        Pedal edema    Predominantly R>L foot. Anticipate related to R knee injury/pain/swelling. Supportive measures reviewed, suggested compression stockings given good pedal pulses present.          No orders of the defined types were placed in this encounter.  No orders of the defined types were placed in this encounter.    Patient Instructions  R knee xray today  Buy knee brace/sleeve for extra support. We will be in touch with results and consider seeing Dr Lorelei Pont sports medicine to consider injection vs orthopedics.  Continue leg elevation, avoiding salt, could consider compression stockings for leg swelling.   Follow up plan: Return if symptoms worsen or fail to improve.  Ria Bush, MD

## 2021-08-21 NOTE — Patient Instructions (Addendum)
R knee xray today  Buy knee brace/sleeve for extra support. We will be in touch with results and consider seeing Dr Lorelei Pont sports medicine to consider injection vs orthopedics.  Continue leg elevation, avoiding salt, could consider compression stockings for leg swelling.

## 2021-08-22 ENCOUNTER — Ambulatory Visit (INDEPENDENT_AMBULATORY_CARE_PROVIDER_SITE_OTHER): Payer: Medicare Other | Admitting: Family Medicine

## 2021-08-22 ENCOUNTER — Telehealth: Payer: Self-pay

## 2021-08-22 ENCOUNTER — Encounter: Payer: Self-pay | Admitting: Family Medicine

## 2021-08-22 VITALS — BP 112/80 | HR 72 | Temp 98.2°F | Ht 61.0 in | Wt 198.2 lb

## 2021-08-22 DIAGNOSIS — Z7902 Long term (current) use of antithrombotics/antiplatelets: Secondary | ICD-10-CM | POA: Diagnosis not present

## 2021-08-22 DIAGNOSIS — M1711 Unilateral primary osteoarthritis, right knee: Secondary | ICD-10-CM | POA: Diagnosis not present

## 2021-08-22 DIAGNOSIS — R600 Localized edema: Secondary | ICD-10-CM | POA: Insufficient documentation

## 2021-08-22 MED ORDER — TRIAMCINOLONE ACETONIDE 40 MG/ML IJ SUSP
40.0000 mg | Freq: Once | INTRAMUSCULAR | Status: AC
  Administered 2021-08-22: 40 mg via INTRA_ARTICULAR

## 2021-08-22 NOTE — Assessment & Plan Note (Signed)
Predominantly R>L foot. Anticipate related to R knee injury/pain/swelling. Supportive measures reviewed, suggested compression stockings given good pedal pulses present.

## 2021-08-22 NOTE — Assessment & Plan Note (Signed)
Sjogren related vs idiopathic.  Stable period on gabapentin. Amitriptyline was stopped given interaction with Tikosyn.

## 2021-08-22 NOTE — Assessment & Plan Note (Addendum)
H/o this on right by [place] 2019.  Anticipate recurrent L sided symptoms causing discomfort.  Discussed rheum f/u if worsening.

## 2021-08-22 NOTE — Progress Notes (Signed)
Kiora Hallberg T. Julianne Chamberlin, MD, [place] at [facility] [place], [address]  Phone: [phone]  FAX: [phone]  Merikay Lesniewski - 71 y.o. female  MRN [id number]  Date of Birth: 1950/04/27  Date: 08/22/2021  PCP: Ria Bush, MD  Referral: Ria Bush, MD  Chief Complaint  Patient presents with   Knee Pain    Right-seen by Dr. Darnell Level 06/06/20323   Subjective:   Starlett Pehrson Stella is a 71 y.o. very pleasant female patient with Body mass index is 37.46 kg/m. who presents with the following:  She is seen here today by me for a new consultation for knee pain.  She actually saw my partner Dr. Danise Mina yesterday for his right knee.  The plain x-rays from August 21, 2021 are reviewed by me independently.  Patient does have mild tricompartmental arthritis, this is worse in the medial compartment.  There is joint space narrowing, and early osteophyte formation.  Electronically Signed  By: Owens Loffler, MD On: 08/22/2021  2:00 PM EDT   She is at baseline on Eliquis for atrial flutter. She also has a significant history of congestive heart failure.  06/28/2021, she had a fall. Does have some pain topically. R side, no pain in the L knee.   Reports a fall on April at a gas station.   She has right now having medial greater than lateral joint line tenderness.  Review of Systems is noted in the HPI, as appropriate  Objective:   BP 112/80   Pulse 72   Temp 98.2 F (36.8 C) (Oral)   Ht '5\' 1"'$  (1.549 m)   Wt 198 lb 4 oz (89.9 kg)   SpO2 98%   BMI 37.46 kg/m   GEN: No acute distress; alert,appropriate. PULM: Breathing comfortably in no respiratory distress PSYCH: Normally interactive.   Right knee: Minimal to mild effusion.  Stable patella and some mild pain with loading the medial lateral patellar facets.  Notable medial joint line tenderness greater than lateral joint line tenderness. Stable to varus  and valgus stress. Lachman is negative as well as drawer testing. McMurray's and flexion pinch as well as all sort of forced flexion causes pain. Nontender at the tibial plateau.  Laboratory and Imaging Data:  Assessment and Plan:     ICD-10-CM   1. Primary osteoarthritis of right knee  M17.11 triamcinolone acetonide (KENALOG-40) injection 40 mg     Right-sided knee osteoarthritis, acute on chronic with exacerbation.  She has medial and lateral joint line tenderness, and she also had a recent fall, but with her degenerative knee, this does all seem to be an arthritis flare. Challenging that the patient also is on Tikosyn and Eliquis.  With minimal other conservative options, I am going to do a therapeutic injection today.  She does also have some tramadol at home, this has been minimally helpful.  Aspiration/Injection Procedure Note Jaquel Coomer Puello [birth year] Date of procedure: 08/22/2021  Procedure: Large Joint Aspiration / Injection of Knee, R Indications: Pain  Procedure Details Patient verbally consented to procedure. Risks, benefits, and alternatives explained. Sterilely prepped with Chloraprep. Ethyl cholride used for anesthesia. 9 cc Lidocaine 1% mixed with 1 mL of Kenalog 40 mg injected using the anteromedial approach without difficulty. No complications with procedure and tolerated well. Patient had decreased pain post-injection. Medication: 1 mL of Kenalog 40 mg   Medication Management during today's office visit: Meds ordered this encounter  Medications   triamcinolone acetonide (  KENALOG-40) injection 40 mg   There are no discontinued medications.  Orders placed today for conditions managed today: No orders of the defined types were placed in this encounter.   Follow-up if needed: No follow-ups on file.  [employer] One speech-to-text software was used for transcription in this dictation.  Possible transcriptional errors can occur using Editor, commissioning.    Signed,  Maud Deed. Sly Parlee, MD   Outpatient Encounter Medications as of 08/22/2021  Medication Sig   ALPRAZolam (XANAX) 0.5 MG tablet Take 1 tablet (0.5 mg total) by mouth 3 (three) times daily as needed for anxiety.   amoxicillin (AMOXIL) 500 MG tablet Take 4 tablets ('2000mg'$ ) by mouth 30-60 min prior to dental procedure.   antiseptic oral rinse (BIOTENE) LIQD 15 mLs by Mouth Rinse route 2 (two) times daily as needed for dry mouth.   cholecalciferol (VITAMIN D) 1000 UNITS tablet Take 1,000 Units by mouth daily.   co-enzyme Q-10 30 MG capsule Take 30 mg by mouth daily.   diltiazem (CARDIZEM CD) 360 MG 24 hr capsule TAKE 1 CAPSULE BY MOUTH  DAILY   docusate sodium (COLACE) 100 MG capsule Take 100 mg by mouth daily.   dofetilide (TIKOSYN) 250 MCG capsule Take 1 capsule (250 mcg total) by mouth 2 (two) times daily.   ELIQUIS 5 MG TABS tablet TAKE 1 TABLET BY MOUTH  TWICE DAILY   ezetimibe (ZETIA) 10 MG tablet TAKE 1 TABLET BY MOUTH AT  BEDTIME   furosemide (LASIX) 20 MG tablet Take 3 tablets (60 mg total) by mouth daily.   gabapentin (NEURONTIN) 300 MG capsule TAKE 2 CAPSULES BY MOUTH IN THE MORNING , 1 CAPSULE IN  THE AFTERNOON AND 2  CAPSULES AT BEDTIME   losartan (COZAAR) 50 MG tablet TAKE 1 TABLET BY MOUTH  DAILY   lovastatin (MEVACOR) 10 MG tablet TAKE 1 TABLET BY MOUTH  EVERY MONDAY, WEDNESDAY,  AND FRIDAY   metoprolol tartrate (LOPRESSOR) 50 MG tablet TAKE 1 AND 1/2 TABLETS BY  MOUTH TWICE DAILY   pantoprazole (PROTONIX) 40 MG tablet Take 40 mg by mouth daily.   Polyethyl Glycol-Propyl Glycol (SYSTANE) 0.4-0.3 % GEL ophthalmic gel Place 1 drop into both eyes 2 (two) times daily.    RESTASIS 0.05 % ophthalmic emulsion INSTILL ONE DROP IN BOTH  EYES TWO TIMES DAILY   temazepam (RESTORIL) 30 MG capsule Take 1 capsule (30 mg total) by mouth at bedtime as needed for sleep.   traMADol (ULTRAM) 50 MG tablet TAKE 1 TABLET BY MOUTH DAILY AS  NEEDED   vitamin B-12 (CYANOCOBALAMIN) 1000 MCG  tablet Take 1 tablet (1,000 mcg total) by mouth every Monday, Wednesday, and Friday.   potassium chloride (KLOR-CON) 10 MEQ tablet Take 1 tablet (10 mEq total) by mouth daily.   [EXPIRED] triamcinolone acetonide (KENALOG-40) injection 40 mg    No facility-administered encounter medications on file as of 08/22/2021.

## 2021-08-22 NOTE — Telephone Encounter (Signed)
Pt rtn call.  I relayed results and Dr. Synthia Innocent message.  Pt verbalizes understanding and scheduled OV today with Dr. Lorelei Pont at 2:00.  States she also has rheumatology appt at the end of the month.

## 2021-08-22 NOTE — Assessment & Plan Note (Addendum)
Followed by rheumatology. Anticipate chronic tender left submandibular swelling due to inflamed salivary gland.

## 2021-08-22 NOTE — Assessment & Plan Note (Addendum)
L knee pain has largely resolved, but notes persistent R anterior knee pain associated with R knee and R>L foot swelling. Anticipate effusion present. Increased lasix dose hasn't really helped with this. Anticipate osteoathritis exacerbation triggered by fall 2 months ago. rec supportive care with knee brace, continue topical voltaren, PRN tramadol. Will check new baseline R knee xray and offered referral to Dr Copland vs ortho surgery to consider steroid injection vs further surgical treatment - she declines at this time.

## 2021-08-22 NOTE — Assessment & Plan Note (Signed)
Seems euvolemic - doubt noted pedal edema related to CHF.

## 2021-08-22 NOTE — Telephone Encounter (Signed)
Attempted to contact pt.  No answer.  No vm.  Need to relay x-ray results and Dr. Synthia Innocent message.   Results/Dr. Synthia Innocent msg: Your knee xray returned showing mild wear and tear arthritis changes as well as small amount of fluid in your knee joint. There was also chondrocalcinosis of the knee which is where calcium builds up in your cartilage in the knee and is a sign of inflammation.  I recommend knee brace as discussed, and suggest you follow up with Dr Lorelei Pont our sports medicine doctor, or the rheumatologist for further evaluation/treatment, consideration for knee steroid injection.

## 2021-08-28 ENCOUNTER — Other Ambulatory Visit: Payer: Self-pay | Admitting: Internal Medicine

## 2021-08-29 ENCOUNTER — Encounter (HOSPITAL_COMMUNITY): Payer: Self-pay | Admitting: Physician Assistant

## 2021-08-29 ENCOUNTER — Ambulatory Visit (HOSPITAL_COMMUNITY)
Admission: RE | Admit: 2021-08-29 | Discharge: 2021-08-29 | Disposition: A | Discharge status: Home / Self Care | Payer: Medicare Other | Source: Ambulatory Visit | Attending: Physician Assistant | Admitting: Physician Assistant

## 2021-08-29 VITALS — BP 130/72 | HR 72 | Ht 61.0 in | Wt 194.6 lb

## 2021-08-29 DIAGNOSIS — D6869 Other thrombophilia: Secondary | ICD-10-CM | POA: Diagnosis not present

## 2021-08-29 DIAGNOSIS — I099 Rheumatic heart disease, unspecified: Secondary | ICD-10-CM | POA: Insufficient documentation

## 2021-08-29 DIAGNOSIS — Z7901 Long term (current) use of anticoagulants: Secondary | ICD-10-CM | POA: Insufficient documentation

## 2021-08-29 DIAGNOSIS — I484 Atypical atrial flutter: Secondary | ICD-10-CM | POA: Diagnosis not present

## 2021-08-29 DIAGNOSIS — I110 Hypertensive heart disease with heart failure: Secondary | ICD-10-CM | POA: Diagnosis not present

## 2021-08-29 DIAGNOSIS — I5030 Unspecified diastolic (congestive) heart failure: Secondary | ICD-10-CM | POA: Diagnosis not present

## 2021-08-29 NOTE — Progress Notes (Signed)
Primary Care Physician: Ria Bush, MD Referring Physician: Dr. Stanford Breed Primary EP: Dr Greig Right Alexandria Leach is a 71 y.o. female with a h/o new onset typical atrial flutter in April 2022. H/o of rheumatic heart disease with remote repair pf mitral valve in 1980, CHF, CKD.  She was placed on anticoagulation and had a succession CV but ERAfl. She felt much improved while in SR. Patient is s/p dofetilide admission 8/8-8/11/22. She converted with medication and did not require DCCV.   On follow up today, patient reports that she has done well since her last visit. She will occasionally have fatigue with her fibromyalgia but no heart racing or palpitations. No bleeding issues on anticoagulation.    Today, she denies symptoms of palpitations, chest pain, shortness of breath, orthopnea, PND, lower extremity edema, dizziness, presyncope, syncope, or neurologic sequela. The patient is tolerating medications without difficulties and is otherwise without complaint today.   Past Medical History:  Diagnosis Date   Ankle fracture, left 02/19/2015   from a fall   Anxiety    Cataract 2014   corrected with surgery   CHF (congestive heart failure) (HCC)    CKD (chronic kidney disease) stage 3, GFR 30-59 ml/min [facility])    saw nephrologist Dr Harden Mo   DDD (degenerative disc disease), cervical    Depression    Fibromyalgia    Glaucoma    s/p surgery, sees ophtho Q6 mo   History of DVT (deep vein thrombosis) several times latest 2012   receives coumadin while hospitalized   History of kidney stones 2010   History of pulmonary embolism 2001, 2006   completed coumadin courses   History of rheumatic fever x3   HLD (hyperlipidemia)    HTN (hypertension)    Insomnia    Lung nodule 09/22/2012   RLL - 13m, stable since 2014. Thought benign.    Osteoarthritis    shoulders and knees, not RA per Dr DEstanislado Pandy positive ANA, positive Ro   Osteopenia 09/18/2015   DEXA T -1.1 hip, -0.2 spine  08/2015    Personal history of urinary calculi latest 2014   Pneumonia 12/02/2011   PONV (postoperative nausea and vomiting)    Refusal of blood transfusions as patient is Jehovah's Witness    Rheumatic heart disease 1980   s/p mitral valve repair 1980   [place]    Trimalleolar fracture of left ankle 02/23/2015   Past Surgical History:  Procedure Laterality Date   BREAST BIOPSY Right 2006   benign   CARDIOVERSION N/A 07/18/2020   Procedure: CARDIOVERSION;  Surgeon: AElouise Munroe MD;  [facility];  Service: Cardiovascular;  Laterality: N/A;   CHOLECYSTECTOMY  11/27/2011   Procedure: LAPAROSCOPIC CHOLECYSTECTOMY WITH INTRAOPERATIVE CHOLANGIOGRAM;  Surgeon: HAdin Hector MD;  [place] [facility]  Service: General;  Laterality: N/A;  laparoscopic cholecystectomy with choleangiogram umbilical hernia repair   COLONOSCOPY  07/2014   WNL ([facility]   COLONOSCOPY  08/2019   3 TAs, rpt 3 yrs ([place])   dexa  08/2012   normal per patient - no records available   ESOPHAGOGASTRODUODENOSCOPY  08/2019   reactive gastropathy, neg H pylori ([place]   EYE SURGERY Bilateral 2014   cataract removal   MITRAL VALVE REPAIR  1980   open heart   ORIF ANKLE FRACTURE Left 02/26/2015   Procedure: OPEN REDUCTION INTERNAL FIXATION (ORIF) LEFT TRIMALLEOLAR ANKLE FRACTURE;  Surgeon: NLeandrew Koyanagi MD;  [place] [facility]  Service: Orthopedics;  Laterality: Left;  TUBAL LIGATION  3536   UMBILICAL HERNIA REPAIR  11/27/2011   Procedure: HERNIA REPAIR UMBILICAL ADULT;  Surgeon: Adin Hector, MD;  Location: [place];  Service: General;  Laterality: N/A;   [place]   for fibroids -- partial, ovaries remain    Current Outpatient Medications  Medication Sig Dispense Refill   ALPRAZolam (XANAX) 0.5 MG tablet Take 1 tablet (0.5 mg total) by mouth 3 (three) times daily as needed for anxiety. 270 tablet 2   amoxicillin (AMOXIL) 500 MG tablet Take 4 tablets ('2000mg'$ ) by  mouth 30-60 min prior to dental procedure. 4 tablet 5   antiseptic oral rinse (BIOTENE) LIQD 15 mLs by Mouth Rinse route 2 (two) times daily as needed for dry mouth.     cholecalciferol (VITAMIN D) 1000 UNITS tablet Take 1,000 Units by mouth daily.     co-enzyme Q-10 30 MG capsule Take 30 mg by mouth daily.     diltiazem (CARDIZEM CD) 360 MG 24 hr capsule TAKE 1 CAPSULE BY MOUTH  DAILY 100 capsule 2   docusate sodium (COLACE) 100 MG capsule Take 100 mg by mouth daily.     dofetilide (TIKOSYN) 250 MCG capsule Take 1 capsule (250 mcg total) by mouth 2 (two) times daily. 60 capsule 6   ELIQUIS 5 MG TABS tablet TAKE 1 TABLET BY MOUTH  TWICE DAILY 180 tablet 3   ezetimibe (ZETIA) 10 MG tablet TAKE 1 TABLET BY MOUTH AT  BEDTIME 90 tablet 3   furosemide (LASIX) 20 MG tablet Take 3 tablets (60 mg total) by mouth daily. 270 tablet 3   gabapentin (NEURONTIN) 300 MG capsule TAKE 2 CAPSULES BY MOUTH IN THE MORNING , 1 CAPSULE IN  THE AFTERNOON AND 2  CAPSULES AT BEDTIME 450 capsule 3   losartan (COZAAR) 50 MG tablet TAKE 1 TABLET BY MOUTH  DAILY 90 tablet 3   lovastatin (MEVACOR) 10 MG tablet TAKE 1 TABLET BY MOUTH  EVERY MONDAY, WEDNESDAY,  AND FRIDAY 39 tablet 3   metoprolol tartrate (LOPRESSOR) 50 MG tablet TAKE 1 AND 1/2 TABLETS BY  MOUTH TWICE DAILY 270 tablet 3   pantoprazole (PROTONIX) 40 MG tablet Take 40 mg by mouth daily.     Polyethyl Glycol-Propyl Glycol (SYSTANE) 0.4-0.3 % GEL ophthalmic gel Place 1 drop into both eyes 2 (two) times daily.      potassium chloride (KLOR-CON) 10 MEQ tablet Take 1 tablet (10 mEq total) by mouth daily. 90 tablet 3   RESTASIS 0.05 % ophthalmic emulsion INSTILL ONE DROP IN BOTH  EYES TWO TIMES DAILY 180 each 0   temazepam (RESTORIL) 30 MG capsule Take 1 capsule (30 mg total) by mouth at bedtime as needed for sleep. 30 capsule 2   traMADol (ULTRAM) 50 MG tablet TAKE 1 TABLET BY MOUTH DAILY AS  NEEDED 30 tablet 0   vitamin B-12 (CYANOCOBALAMIN) 1000 MCG tablet Take 1  tablet (1,000 mcg total) by mouth every Monday, Wednesday, and Friday.     No current facility-administered medications for this encounter.    Allergies  Allergen Reactions   Cymbalta [Duloxetine Hcl] Other (See Comments)    tachycardia   Statins Nausea Only and Other (See Comments)    Muscle cramps also   Sulfa Antibiotics Nausea And Vomiting    Social History   Socioeconomic History   Marital status: Married    Spouse name: [name]   Number of children: 4   Years of education: 12   Highest education level:  Not on file  Occupational History    Employer: UNEMPLOYED    Comment: Disability  Tobacco Use   Smoking status: Never    Passive exposure: Past   Smokeless tobacco: Never   Tobacco comments:    Never smoke 08/29/21  Vaping Use   Vaping Use: Never used  Substance and Sexual Activity   Alcohol use: No    Alcohol/week: 0.0 standard drinks of alcohol   Drug use: No   Sexual activity: Not Currently    Birth control/protection: Surgical  Other Topics Concern   Not on file  Social History Narrative   Lives with son, 1 dog   Occupation: unemployed, on disability for fibromyalgia since 2008.   Edu: HS   Religion: Jehova's witness   Activity: volunteers at senior center   Diet: some water, fruits/vegetables daily   No caffeine use   Social Determinants of Health   Financial Resource Strain: Low Risk  (04/18/2021)   Overall Financial Resource Strain (CARDIA)    Difficulty of Paying Living Expenses: Not very hard  Food Insecurity: No Food Insecurity (02/12/2021)   Hunger Vital Sign    Worried About Running Out of Food in the Last Year: Never true    Ran Out of Food in the Last Year: Never true  Transportation Needs: No Transportation Needs (02/12/2021)   PRAPARE - Hydrologist (Medical): No    Lack of Transportation (Non-Medical): No  Physical Activity: Sufficiently Active (02/12/2021)   Exercise Vital Sign    Days of Exercise per  Week: 7 days    Minutes of Exercise per Session: 50 min  Stress: No Stress Concern Present (02/12/2021)   [place]    Feeling of Stress : Only a little  Social Connections: Moderately Integrated (02/12/2021)   Social Connection and Isolation Panel [NHANES]    Frequency of Communication with Friends and Family: More than three times a week    Frequency of Social Gatherings with Friends and Family: Never    Attends Religious Services: More than 4 times per year    Active Member of [employer] or Organizations: No    Attends Archivist Meetings: Never    Marital Status: Married  Human resources officer Violence: Not At Risk (02/12/2021)   Humiliation, Afraid, Rape, and Kick questionnaire    Fear of Current or Ex-Partner: No    Emotionally Abused: No    Physically Abused: No    Sexually Abused: No    Family History  Problem Relation Age of Onset   Cancer Mother        lung (nonsmoker)   CAD Mother        MI in her 60s   ALS Mother    Kidney disease Father    Alcohol abuse Father    Diabetes Father    Lupus Sister        and niece   Diabetes Sister    Stroke Sister    Depression Sister    Lupus Sister    Blindness Sister    Cancer Maternal Uncle        bone   Stroke Maternal Grandmother    Cancer Brother        bone   Diabetes Brother    Heart attack Brother    Healthy Son    Healthy Son    Healthy Son    Healthy Son    Kidney failure Other  on HD   Breast cancer Neg Hx     ROS- All systems are reviewed and negative except as per the HPI above  Physical Exam: Vitals:   08/29/21 0949  BP: 130/72  Pulse: 72  Weight: 88.3 kg  Height: '5\' 1"'$  (1.549 m)      Wt Readings from Last 3 Encounters:  08/29/21 88.3 kg  08/22/21 89.9 kg  08/21/21 89.9 kg    Labs: Lab Results  Component Value Date   NA 140 08/14/2021   K 4.2 08/14/2021   CL 105 08/14/2021   CO2 20 08/14/2021   GLUCOSE 102  (H) 08/14/2021   BUN 15 08/14/2021   CREATININE 1.35 (H) 08/14/2021   CALCIUM 9.7 08/14/2021   PHOS 3.2 07/11/2020   MG 2.0 06/28/2021   Lab Results  Component Value Date   INR 0.93 10/12/2015   Lab Results  Component Value Date   CHOL 193 01/12/2021   HDL 70 01/12/2021   LDLCALC 104 (H) 01/12/2021   TRIG 93 01/12/2021    GEN- The patient is a well appearing obese female, alert and oriented x 3 today.   HEENT-head normocephalic, atraumatic, sclera clear, conjunctiva pink, hearing intact, trachea midline. Lungs- Clear to ausculation bilaterally, normal work of breathing Heart- Regular rate and rhythm, no murmurs, rubs or gallops  GI- soft, NT, ND, + BS Extremities- no clubbing, cyanosis, or edema MS- no significant deformity or atrophy Skin- no rash or lesion Psych- euthymic mood, full affect Neuro- strength and sensation are intact    EKG-  SR, with sinus arrhythmia Vent. rate 72 BPM PR interval 180 ms QRS duration 80 ms QT/QTcB 392/429 ms  CHA2DS2-VASc Score = 4  The patient's score is based upon: CHF History: 1 HTN History: 1 Diabetes History: 0 Stroke History: 0 Vascular Disease History: 0 Age Score: 1 Gender Score: 1      ASSESSMENT AND PLAN: 1. Atypical atrial flutter The patient's CHA2DS2-VASc score is 4, indicating a 4.8% annual risk of stroke.   S/p dofetilide admission 8/8-8/11/22 Patient appears to be maintaining SR. Continue dofetilide 250 mcg BID. QT stable. Recent lab work reviewed. Continue Eliquis 5 mg BID Continue diltiazem 360 mg daily Continue Lopressor 75 mg BID  2. Secondary Hypercoagulable State (ICD10:  D68.69) The patient is at significant risk for stroke/thromboembolism based upon her CHA2DS2-VASc Score of 4.  Continue Apixaban (Eliquis).   3. HFpEF  Appears euvolemic today.  4. HTN Stable, no changes today.   Follow up with Dr Stanford Breed as scheduled. AF clinic in 6 months.    [facility] [address] [place], [place] [address] [phone]

## 2021-08-29 NOTE — Telephone Encounter (Signed)
This is a A-Fib clinic pt 

## 2021-08-30 ENCOUNTER — Encounter (HOSPITAL_COMMUNITY): Payer: Self-pay | Admitting: Psychiatry

## 2021-08-30 ENCOUNTER — Telehealth (INDEPENDENT_AMBULATORY_CARE_PROVIDER_SITE_OTHER): Payer: Medicare Other | Admitting: Psychiatry

## 2021-08-30 DIAGNOSIS — F411 Generalized anxiety disorder: Secondary | ICD-10-CM

## 2021-08-30 DIAGNOSIS — F331 Major depressive disorder, recurrent, moderate: Secondary | ICD-10-CM | POA: Diagnosis not present

## 2021-08-30 MED ORDER — TEMAZEPAM 30 MG PO CAPS
30.0000 mg | ORAL_CAPSULE | Freq: Every evening | ORAL | 2 refills | Status: DC | PRN
Start: 2021-08-30 — End: 2021-11-21

## 2021-08-30 MED ORDER — ALPRAZOLAM 0.5 MG PO TABS: 0.5000 mg | ORAL_TABLET | Freq: Three times a day (TID) | ORAL | 2 refills | Status: DC | PRN

## 2021-08-30 NOTE — Progress Notes (Signed)
Virtual Visit via Video Note  I connected with Alexandria Leach on 08/30/21 at  9:20 AM EDT by a video enabled telemedicine application and verified that I am speaking with the correct person using two identifiers.  Location: Patient: home Provider: office   I discussed the limitations of evaluation and management by telemedicine and the availability of in person appointments. The patient expressed understanding and agreed to proceed.     I discussed the assessment and treatment plan with the patient. The patient was provided an opportunity to ask questions and all were answered. The patient agreed with the plan and demonstrated an understanding of the instructions.   The patient was advised to call back or seek an in-person evaluation if the symptoms worsen or if the condition fails to improve as anticipated.  I provided 15 minutes of non-face-to-face time during this encounter.   Alexandria Spiller, MD  [facility] MD/PA/NP OP Progress Note  08/30/2021 9:29 AM Alexandria Leach  MRN:  [id number]  Chief Complaint:  Chief Complaint  Patient presents with   Depression   Anxiety   Follow-up   HPI: This patient is a 71 year old separated black female who lives alone in [place].  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  She has 4 sons.  The patient returns for follow-up after 3 months.  Overall she is doing well.  She suffered a fall back in April but fortunately did not have any fractures.  She does have arthritis in her knees.  Her cardiac status is good with the Tikosyn.  Overall her mood has been good and she is spending a lot of time with her 69-year-old grandson.  Her energy is variable because of the rheumatoid arthritis and Sjogren's syndrome.  She is trying to stay busy and denies significant depression anxiety thoughts of health self-harm or suicide.  She is sleeping well with the temazepam Visit Diagnosis:     ICD-10-CM   1. GAD (generalized anxiety disorder)  F41.1     2. Major depressive disorder, recurrent episode, moderate ([place])  F33.1       Past Psychiatric History: none  Past Medical History:  Past Medical History:  Diagnosis Date   Ankle fracture, left 02/19/2015   from a fall   Anxiety    Cataract 2014   corrected with surgery   CHF (congestive heart failure) (HCC)    CKD (chronic kidney disease) stage 3, GFR 30-59 ml/min (HCC)    saw nephrologist Dr Harden Mo   DDD (degenerative disc disease), cervical    Depression    Fibromyalgia    Glaucoma    s/p surgery, sees ophtho Q6 mo   History of DVT (deep vein thrombosis) several times latest 2012   receives coumadin while hospitalized   History of kidney stones 2010   History of pulmonary embolism 2001, 2006   completed coumadin courses   History of rheumatic fever x3   HLD (hyperlipidemia)    HTN (hypertension)    Insomnia    Lung nodule 09/22/2012   RLL - 45m, stable since 2014. Thought benign.    Osteoarthritis    shoulders and knees, not RA per Dr DEstanislado Pandy positive ANA, positive Ro   Osteopenia 09/18/2015   DEXA T -1.1 hip, -0.2 spine 08/2015    Personal history of urinary calculi latest 2014   Pneumonia 12/02/2011   PONV (postoperative nausea and vomiting)    Refusal of blood transfusions  as patient is Jehovah's Witness    Rheumatic heart disease 1980   s/p mitral valve repair 1980   Sjogren's syndrome ([place])    Trimalleolar fracture of left ankle 02/23/2015    Past Surgical History:  Procedure Laterality Date   BREAST BIOPSY Right 2006   benign   CARDIOVERSION N/A 07/18/2020   Procedure: CARDIOVERSION;  Surgeon: Elouise Munroe, MD;  [facility] ENDOSCOPY;  Service: Cardiovascular;  Laterality: N/A;   CHOLECYSTECTOMY  11/27/2011   Procedure: LAPAROSCOPIC CHOLECYSTECTOMY WITH INTRAOPERATIVE CHOLANGIOGRAM;  Surgeon: Adin Hector, MD;  Location: [place];  Service: General;  Laterality: N/A;  laparoscopic  cholecystectomy with choleangiogram umbilical hernia repair   COLONOSCOPY  07/2014   WNL Amedeo Plenty)   COLONOSCOPY  08/2019   3 TAs, rpt 3 yrs ([place])   dexa  08/2012   normal per patient - no records available   ESOPHAGOGASTRODUODENOSCOPY  08/2019   reactive gastropathy, neg H pylori ([place])   EYE SURGERY Bilateral 2014   cataract removal   MITRAL VALVE REPAIR  1980   open heart   ORIF ANKLE FRACTURE Left 02/26/2015   Procedure: OPEN REDUCTION INTERNAL FIXATION (ORIF) LEFT TRIMALLEOLAR ANKLE FRACTURE;  Surgeon: [name], MD;  Location: [place];  Service: Orthopedics;  Laterality: Left;   TUBAL LIGATION  0175   UMBILICAL HERNIA REPAIR  11/27/2011   Procedure: HERNIA REPAIR UMBILICAL ADULT;  Surgeon: Adin Hector, MD;  Location: [place];  Service: General;  Laterality: N/A;   [place]   for fibroids -- partial, ovaries remain    Family Psychiatric History: see below  Family History:  Family History  Problem Relation Age of Onset   Cancer Mother        lung (nonsmoker)   CAD Mother        MI in her 67s   ALS Mother    Kidney disease Father    Alcohol abuse Father    Diabetes Father    Lupus Sister        and niece   Diabetes Sister    Stroke Sister    Depression Sister    Lupus Sister    Blindness Sister    Cancer Maternal Uncle        bone   Stroke Maternal Grandmother    Cancer Brother        bone   Diabetes Brother    Heart attack Brother    Healthy Son    Healthy Son    Healthy Son    Healthy Son    Kidney failure Other        on HD   Breast cancer Neg Hx     Social History:  Social History   Socioeconomic History   Marital status: Married    Spouse name: [name]   Number of children: 4   Years of education: 12   Highest education level: Not on file  Occupational History    Employer: UNEMPLOYED    Comment: Disability  Tobacco Use   Smoking status: Never    Passive exposure: Past   Smokeless tobacco: Never   Tobacco  comments:    Never smoke 08/29/21  Vaping Use   Vaping Use: Never used  Substance and Sexual Activity   Alcohol use: No    Alcohol/week: 0.0 standard drinks of alcohol   Drug use: No   Sexual activity: Not Currently    Birth control/protection: Surgical  Other Topics Concern   Not on  file  Social History Narrative   Lives with son, 1 dog   Occupation: unemployed, on disability for fibromyalgia since 2008.   Edu: HS   Religion: Jehova's witness   Activity: volunteers at senior center   Diet: some water, fruits/vegetables daily   No caffeine use   Social Determinants of Health   Financial Resource Strain: Low Risk  (04/18/2021)   Overall Financial Resource Strain (CARDIA)    Difficulty of Paying Living Expenses: Not very hard  Food Insecurity: No Food Insecurity (02/12/2021)   Hunger Vital Sign    Worried About Running Out of Food in the Last Year: Never true    Ran Out of Food in the Last Year: Never true  Transportation Needs: No Transportation Needs (02/12/2021)   PRAPARE - Hydrologist (Medical): No    Lack of Transportation (Non-Medical): No  Physical Activity: Sufficiently Active (02/12/2021)   Exercise Vital Sign    Days of Exercise per Week: 7 days    Minutes of Exercise per Session: 50 min  Stress: No Stress Concern Present (02/12/2021)   [place]    Feeling of Stress : Only a little  Social Connections: Moderately Integrated (02/12/2021)   Social Connection and Isolation Panel [NHANES]    Frequency of Communication with Friends and Family: More than three times a week    Frequency of Social Gatherings with Friends and Family: Never    Attends Religious Services: More than 4 times per year    Active Member of [employer] or Organizations: No    Attends Archivist Meetings: Never    Marital Status: Married    Allergies:  Allergies  Allergen Reactions   Cymbalta  [Duloxetine Hcl] Other (See Comments)    tachycardia   Statins Nausea Only and Other (See Comments)    Muscle cramps also   Sulfa Antibiotics Nausea And Vomiting    Metabolic Disorder Labs: Lab Results  Component Value Date   HGBA1C 6.0 (H) 01/12/2021   MPG 126 01/12/2021   MPG 114 10/13/2015   No results found for: "PROLACTIN" Lab Results  Component Value Date   CHOL 193 01/12/2021   TRIG 93 01/12/2021   HDL 70 01/12/2021   CHOLHDL 2.8 01/12/2021   VLDL 14.4 12/23/2019   LDLCALC 104 (H) 01/12/2021   LDLCALC 82 05/29/2020   Lab Results  Component Value Date   TSH 1.49 01/12/2021   TSH 4.12 12/23/2019    Therapeutic Level Labs: No results found for: "LITHIUM" No results found for: "VALPROATE" No results found for: "CBMZ"  Current Medications: Current Outpatient Medications  Medication Sig Dispense Refill   ALPRAZolam (XANAX) 0.5 MG tablet Take 1 tablet (0.5 mg total) by mouth 3 (three) times daily as needed for anxiety. 270 tablet 2   amoxicillin (AMOXIL) 500 MG tablet Take 4 tablets ('2000mg'$ ) by mouth 30-60 min prior to dental procedure. 4 tablet 5   antiseptic oral rinse (BIOTENE) LIQD 15 mLs by Mouth Rinse route 2 (two) times daily as needed for dry mouth.     cholecalciferol (VITAMIN D) 1000 UNITS tablet Take 1,000 Units by mouth daily.     co-enzyme Q-10 30 MG capsule Take 30 mg by mouth daily.     diltiazem (CARDIZEM CD) 360 MG 24 hr capsule TAKE 1 CAPSULE BY MOUTH  DAILY 100 capsule 2   docusate sodium (COLACE) 100 MG capsule Take 100 mg by mouth daily.  dofetilide (TIKOSYN) 250 MCG capsule TAKE 1 CAPSULE(250 MCG) BY MOUTH TWICE DAILY 180 capsule 1   ELIQUIS 5 MG TABS tablet TAKE 1 TABLET BY MOUTH  TWICE DAILY 180 tablet 3   ezetimibe (ZETIA) 10 MG tablet TAKE 1 TABLET BY MOUTH AT  BEDTIME 90 tablet 3   furosemide (LASIX) 20 MG tablet Take 3 tablets (60 mg total) by mouth daily. 270 tablet 3   gabapentin (NEURONTIN) 300 MG capsule TAKE 2 CAPSULES BY MOUTH IN  THE MORNING , 1 CAPSULE IN  THE AFTERNOON AND 2  CAPSULES AT BEDTIME 450 capsule 3   losartan (COZAAR) 50 MG tablet TAKE 1 TABLET BY MOUTH  DAILY 90 tablet 3   lovastatin (MEVACOR) 10 MG tablet TAKE 1 TABLET BY MOUTH  EVERY MONDAY, WEDNESDAY,  AND FRIDAY 39 tablet 3   metoprolol tartrate (LOPRESSOR) 50 MG tablet TAKE 1 AND 1/2 TABLETS BY  MOUTH TWICE DAILY 270 tablet 3   pantoprazole (PROTONIX) 40 MG tablet Take 40 mg by mouth daily.     Polyethyl Glycol-Propyl Glycol (SYSTANE) 0.4-0.3 % GEL ophthalmic gel Place 1 drop into both eyes 2 (two) times daily.      potassium chloride (KLOR-CON) 10 MEQ tablet Take 1 tablet (10 mEq total) by mouth daily. 90 tablet 3   RESTASIS 0.05 % ophthalmic emulsion INSTILL ONE DROP IN BOTH  EYES TWO TIMES DAILY 180 each 0   temazepam (RESTORIL) 30 MG capsule Take 1 capsule (30 mg total) by mouth at bedtime as needed for sleep. 30 capsule 2   traMADol (ULTRAM) 50 MG tablet TAKE 1 TABLET BY MOUTH DAILY AS  NEEDED 30 tablet 0   vitamin B-12 (CYANOCOBALAMIN) 1000 MCG tablet Take 1 tablet (1,000 mcg total) by mouth every Monday, Wednesday, and Friday.     No current facility-administered medications for this visit.     Musculoskeletal: Strength & Muscle Tone: within normal limits Gait & Station: normal Patient leans: N/A  Psychiatric Specialty Exam: Review of Systems  Musculoskeletal:  Positive for arthralgias and joint swelling.  All other systems reviewed and are negative.   There were no vitals taken for this visit.There is no height or weight on file to calculate BMI.  General Appearance: Casual and Fairly Groomed  Eye Contact:  Good  Speech:  Clear and Coherent  Volume:  Normal  Mood:  Euthymic  Affect:  Appropriate and Congruent  Thought Process:  Goal Directed  Orientation:  Full (Time, Place, and Person)  Thought Content: WDL   Suicidal Thoughts:  No  Homicidal Thoughts:  No  Memory:  Immediate;   Good Recent;   Good Remote;   Good   Judgement:  Good  Insight:  Good  Psychomotor Activity:  Decreased  Concentration:  Concentration: Good and Attention Span: Good  Recall:  Good  Fund of Knowledge: Good  Language: Good  Akathisia:  No  Handed:  Right  AIMS (if indicated): not done  Assets:  Communication Skills Desire for Improvement Resilience Social Support Talents/Skills  ADL's:  Intact  Cognition: WNL  Sleep:  Good   Screenings: GAD-7    Flowsheet Row Office Visit from 12/28/2019 in [place] at [facility] Visit from 12/22/2018 in [place] at [facility]  Total GAD-7 Score 0 [place] from 12/11/2017 in [place] at [place] from 12/09/2016 in [place] [facility] [place] from 11/30/2015 in [facility]  Total Score (max 30 points ) '20 19 18      '$ PHQ2-9    Flowsheet Row Video Visit from 08/30/2021 in [place] Video Visit from 05/22/2021 in [place] Video Visit from 03/30/2021 in [place] Video Visit from 02/23/2021 in [place] Video Visit from 12/05/2020 in [facility]  PHQ-2 Total Score 0 0 1 0 0      Flowsheet Row Video Visit from 08/30/2021 in [place] [facility] from 06/29/2021 in [facility] at [facility] Video Visit from 05/22/2021 in [place] No Risk No Risk No Risk        Assessment and Plan: This patient is a 71year-old female with a history of depression anxiety and Sjogren syndrome.  She is no longer taking any antidepressants and does not feels that she needs one as her mood has been good.  She will continue Xanax  0.5 mg up to 3 times daily as needed for anxiety and Restoril 30 mg at bedtime for sleep.  She will return to see me in 3 months  Collaboration of Care: Collaboration of Care: Primary Care Provider AEB notes are shared with PCP on the epic system  Patient/Guardian was advised Release of Information must be obtained prior to any record release in order to collaborate their care with an outside provider. Patient/Guardian was advised if they have not already done so to contact the registration department to sign all necessary forms in order for [place] to release information regarding their care.   Consent: Patient/Guardian gives verbal consent for treatment and assignment of benefits for services provided during this visit. Patient/Guardian expressed understanding and agreed to proceed.    Alexandria Spiller, MD 08/30/2021, 9:29 AM

## 2021-08-31 DIAGNOSIS — H16212 Exposure keratoconjunctivitis, left eye: Secondary | ICD-10-CM | POA: Diagnosis not present

## 2021-09-12 DIAGNOSIS — H16212 Exposure keratoconjunctivitis, left eye: Secondary | ICD-10-CM | POA: Diagnosis not present

## 2021-09-12 DIAGNOSIS — H16223 Keratoconjunctivitis sicca, not specified as Sjogren's, bilateral: Secondary | ICD-10-CM | POA: Diagnosis not present

## 2021-09-12 DIAGNOSIS — H18832 Recurrent erosion of cornea, left eye: Secondary | ICD-10-CM | POA: Diagnosis not present

## 2021-09-14 ENCOUNTER — Telehealth: Payer: Self-pay | Admitting: *Deleted

## 2021-09-14 ENCOUNTER — Other Ambulatory Visit: Payer: Self-pay | Admitting: Family Medicine

## 2021-09-14 DIAGNOSIS — I5032 Chronic diastolic (congestive) heart failure: Secondary | ICD-10-CM

## 2021-09-14 NOTE — Telephone Encounter (Signed)
Received a potential drug to drug interaction from [facility].   Possible interaction between: Tramadol and Temazepam.  Possible Risks: fatal opioid overdose.  Reviewed by: Hazel Sams, PA-C  Recommendations: Patient takes tramadol very sparingly. Patient advised to avoid concurrent use of tramadol and temazepam.   Notified patient of possible interactions between Tramadol and Temazepam. Advised risk include: opioid overdose. Patient advised of the following recommendations: Patient takes tramadol very sparingly. Patient advised to avoid concurrent use of tramadol and temazepam. Patient expressed understanding.

## 2021-09-20 ENCOUNTER — Telehealth: Payer: Self-pay | Admitting: Physician Assistant

## 2021-09-20 ENCOUNTER — Telehealth: Payer: Self-pay

## 2021-09-20 NOTE — Telephone Encounter (Addendum)
Patient called the answering service, her message stating that she had tested positive for COVID.  I called the patient back but did not get an answer and was unable to leave a message.  Rosaria Ferries, PA-C 09/20/2021 6:38 PM  Received another page from [employer]. Called back, again, no answer and unable to leave a message.  Rosaria Ferries, PA-C 09/20/2021 7:05 PM  Spoke with Dr. Debara Pickett, Dr. Lovena Le, and the pharmacist here at [facility]  Although it may be possible to use Paxlovid to help with COVID symptoms based on Lexi comp data, the physicians do not recommend it.  Both of the MDs recommend the patient being treated symptomatically and following up with her PCP.  I attempted to call the patient to relay this information, but once again got no answer and was unable to leave a message.  Rosaria Ferries, PA-C 09/20/2021 8:00 PM

## 2021-09-20 NOTE — Progress Notes (Signed)
Chronic Care Management Pharmacy Assistant   Name: Alexandria Leach  MRN: [id number] DOB: [birth year]  Reason for Encounter: CCM (Chronic Heart Failure)   Recent office visits:  08/22/21 Xray results: showing mild wear and tear arthritis changes as well as small amount of fluid in your knee joint. There was also chondrocalcinosis of the knee  08/21/21 Ria Bush, MD Acute Bilateral Knee Pain Xray Right knee No med changes 07/11/21 Ria Bush, MD Acute Bilateral Knee Pain - bursitis on front of both knees after recent fall. Start: voltaren gel 2-3 times a day FU 6 months  Recent consult visits:  08/29/21 Malka So, PA (Cardiology) Atypical Atrial Flutter Ordered: EKG No med changes. FU 6 months 08/22/21 Owens Loffler, MD (Family Medicine) Osteoarthritis of right knee. Administered: triamcinolone acetonide (KENALOG-40) injection 40 mg 08/06/21 Kirk Ruths, MD (Cardiology) Message - Change Furosemide 60 mg due to retainitriamcinolone acetonide [facility]) injection 40 mgng fluid 06/29/21 [facility] Fall and Contusion of Knee Wear splint for one week No med changes 06/28/21 Barrington Ellison, PA (Cardiology) Atypical Atrial Flutter FU 6 months 05/14/21 Hazel Sams, PA (Rheumatology) Sjogren's Syndrome Abnormal Labs: "ANA remains positive but is a low titer.  No changes recommended at this time." No med changes FU 5 months 04/26/21 Melissa Noon (Optometry) Superficial Keratitis 04/20/21 Luberta Mutter (Ophthalmology) [facility] visits:  None in previous 6 months  Medications: Outpatient Encounter Medications as of 09/20/2021  Medication Sig   ALPRAZolam (XANAX) 0.5 MG tablet Take 1 tablet (0.5 mg total) by mouth 3 (three) times daily as needed for anxiety.   amoxicillin (AMOXIL) 500 MG tablet Take 4 tablets ('2000mg'$ ) by mouth 30-60 min prior to dental procedure.   antiseptic oral rinse (BIOTENE) LIQD 15 mLs by Mouth Rinse route  2 (two) times daily as needed for dry mouth.   cholecalciferol (VITAMIN D) 1000 UNITS tablet Take 1,000 Units by mouth daily.   co-enzyme Q-10 30 MG capsule Take 30 mg by mouth daily.   diltiazem (CARDIZEM CD) 360 MG 24 hr capsule TAKE 1 CAPSULE BY MOUTH  DAILY   docusate sodium (COLACE) 100 MG capsule Take 100 mg by mouth daily.   dofetilide (TIKOSYN) 250 MCG capsule TAKE 1 CAPSULE(250 MCG) BY MOUTH TWICE DAILY   ELIQUIS 5 MG TABS tablet TAKE 1 TABLET BY MOUTH  TWICE DAILY   ezetimibe (ZETIA) 10 MG tablet TAKE 1 TABLET BY MOUTH AT  BEDTIME   furosemide (LASIX) 20 MG tablet Take 3 tablets (60 mg total) by mouth daily.   gabapentin (NEURONTIN) 300 MG capsule TAKE 2 CAPSULES BY MOUTH IN THE MORNING , 1 CAPSULE IN  THE AFTERNOON AND 2  CAPSULES AT BEDTIME   losartan (COZAAR) 50 MG tablet TAKE 1 TABLET BY MOUTH  DAILY   lovastatin (MEVACOR) 10 MG tablet TAKE 1 TABLET BY MOUTH  EVERY MONDAY, WEDNESDAY,  AND FRIDAY   metoprolol tartrate (LOPRESSOR) 50 MG tablet TAKE 1 AND 1/2 TABLETS BY  MOUTH TWICE DAILY   pantoprazole (PROTONIX) 40 MG tablet Take 40 mg by mouth daily.   Polyethyl Glycol-Propyl Glycol (SYSTANE) 0.4-0.3 % GEL ophthalmic gel Place 1 drop into both eyes 2 (two) times daily.    potassium chloride (KLOR-CON) 10 MEQ tablet Take 1 tablet (10 mEq total) by mouth daily.   RESTASIS 0.05 % ophthalmic emulsion INSTILL ONE DROP IN BOTH  EYES TWO TIMES DAILY   temazepam (RESTORIL) 30 MG capsule Take 1 capsule (30 mg total) by mouth at  bedtime as needed for sleep.   traMADol (ULTRAM) 50 MG tablet TAKE 1 TABLET BY MOUTH DAILY AS  NEEDED   vitamin B-12 (CYANOCOBALAMIN) 1000 MCG tablet Take 1 tablet (1,000 mcg total) by mouth every Monday, Wednesday, and Friday.   No facility-administered encounter medications on file as of 09/20/2021.   Attempted contact with patient 3 times. Unsuccessful outreach. Will atttempt contact next month.  Current heart failure regimen: Lasix 20 mg - 1 tablet daily PRN  edema Metoprolol tartrate 50 mg - Take 75 mg BID  Potassium 20 mEq - 1 daily  Patient sees a cardiologist?              Name: Kirk Ruths, MD Last visit?  08/29/2021 Upcoming visit? 12/25/2021  Star Rating Drugs:  Medication:  Last Fill: Day Supply Losartan 50 mg 08/01/21 100 Lovastatin 10 mg 08/26/21 100  Care Gaps: Annual wellness visit in last year? Yes 02/12/21 Most Recent BP reading: 130/72 on 08/29/2021  CCM appointment on 04/15/22  Charlene Brooke, CPP notified  Marijean Niemann, [place] Assistant [phone]

## 2021-09-21 ENCOUNTER — Encounter: Payer: Self-pay | Admitting: Nurse Practitioner

## 2021-09-21 ENCOUNTER — Telehealth (INDEPENDENT_AMBULATORY_CARE_PROVIDER_SITE_OTHER): Payer: Medicare Other | Admitting: Nurse Practitioner

## 2021-09-21 VITALS — BP 136/73 | HR 87 | Temp 103.1°F

## 2021-09-21 DIAGNOSIS — U071 COVID-19: Secondary | ICD-10-CM | POA: Diagnosis not present

## 2021-09-21 HISTORY — DX: COVID-19: U07.1

## 2021-09-21 MED ORDER — MOLNUPIRAVIR EUA 200MG CAPSULE: 4.0000 | ORAL_CAPSULE | Freq: Two times a day (BID) | ORAL | 0 refills | Status: AC

## 2021-09-21 NOTE — Progress Notes (Signed)
[facility] [address] [place] [address] Dept: [phone] Dept Fax: [phone]  Telephone Visit  I connected with Alexandria Leach on 09/21/21 at  4:00 PM EDT by telephone and verified that I am speaking with the correct person using two identifiers.  Location patient: Home Location provider: Clinic Persons participating in the virtual visit: Patient; Vance Peper, NP; Marchia Bond, CMA  I discussed the limitations of evaluation and management by telemedicine and the availability of in person appointments. The patient expressed understanding and agreed to proceed.  Chief Complaint  Patient presents with   Covid Positive    Chest pain, sore throat, congestion, fever, pt has not taken  and OTC meds due to heart medications, headache.     SUBJECTIVE:  HPI: Alexandria Leach is a 71 y.o. female who presents with fever that started yesterday and positive covid-19 test at [facility].   UPPER RESPIRATORY TRACT INFECTION  Fever: yes Cough: yes Shortness of breath: yes Wheezing: no Chest pain: no Chest tightness: no Chest congestion: no Nasal congestion: yes Runny nose: yes Post nasal drip: no Sneezing: no Sore throat: no Swollen glands: no Sinus pressure: no Headache: yes Face pain: yes Toothache: no Ear pain: no bilateral Ear pressure: no bilateral Eyes red/itching:no Eye drainage/crusting: no  Vomiting: no Rash: no Fatigue: yes Sick contacts: yes Strep contacts: no  Context: stable Recurrent sinusitis: no Relief with OTC cold/cough medications:  n/a   Treatments attempted: none    Past Medical History:  Diagnosis Date   Ankle fracture, left 02/19/2015   from a fall   Anxiety    Cataract 2014   corrected with surgery   CHF (congestive heart failure) ([place])    CKD (chronic kidney disease) stage 3, GFR 30-59 ml/min ([place])    saw nephrologist Dr Harden Mo   DDD (degenerative disc  disease), cervical    Depression    Fibromyalgia    Glaucoma    s/p surgery, sees ophtho Q6 mo   History of DVT (deep vein thrombosis) several times latest 2012   receives coumadin while hospitalized   History of kidney stones 2010   History of pulmonary embolism 2001, 2006   completed coumadin courses   History of rheumatic fever x3   HLD (hyperlipidemia)    HTN (hypertension)    Insomnia    Lung nodule 09/22/2012   RLL - 83m, stable since 2014. Thought benign.    Osteoarthritis    shoulders and knees, not RA per Dr DEstanislado Pandy positive ANA, positive Ro   Osteopenia 09/18/2015   DEXA T -1.1 hip, -0.2 spine 08/2015    Personal history of urinary calculi latest 2014   Pneumonia 12/02/2011   PONV (postoperative nausea and vomiting)    Refusal of blood transfusions as patient is Jehovah's Witness    Rheumatic heart disease 1980   s/p mitral valve repair 1980   Sjogren's syndrome ([place]    Trimalleolar fracture of left ankle 02/23/2015    Past Surgical History:  Procedure Laterality Date   BREAST BIOPSY Right 2006   benign   CARDIOVERSION N/A 07/18/2020   Procedure: CARDIOVERSION;  Surgeon: AElouise Munroe MD;  [place] [facility]  Service: Cardiovascular;  Laterality: N/A;   CHOLECYSTECTOMY  11/27/2011   Procedure: LAPAROSCOPIC CHOLECYSTECTOMY WITH INTRAOPERATIVE CHOLANGIOGRAM;  Surgeon: HAdin Hector MD;  Location[place] [facility]  Service: General;  Laterality: N/A;  laparoscopic cholecystectomy with choleangiogram umbilical hernia repair   COLONOSCOPY  07/2014  WNL Amedeo Plenty)   COLONOSCOPY  08/2019   3 TAs, rpt 3 yrs ([place])   dexa  08/2012   normal per patient - no records available   ESOPHAGOGASTRODUODENOSCOPY  08/2019   reactive gastropathy, neg H pylori ([place])   EYE SURGERY Bilateral 2014   cataract removal   MITRAL VALVE REPAIR  1980   open heart   ORIF ANKLE FRACTURE Left 02/26/2015   Procedure: OPEN REDUCTION INTERNAL FIXATION (ORIF) LEFT TRIMALLEOLAR ANKLE  FRACTURE;  Surgeon: [name], MD;  Location: [facility];  Service: Orthopedics;  Laterality: Left;   TUBAL LIGATION  7867   UMBILICAL HERNIA REPAIR  11/27/2011   Procedure: HERNIA REPAIR UMBILICAL ADULT;  Surgeon: Adin Hector, MD;  Location: [facility];  Service: General;  Laterality: N/A;   VAGINAL HYSTERECTOMY  1992   for fibroids -- partial, ovaries remain    Family History  Problem Relation Age of Onset   Cancer Mother        lung (nonsmoker)   CAD Mother        MI in her 48s   ALS Mother    Kidney disease Father    Alcohol abuse Father    Diabetes Father    Lupus Sister        and niece   Diabetes Sister    Stroke Sister    Depression Sister    Lupus Sister    Blindness Sister    Cancer Maternal Uncle        bone   Stroke Maternal Grandmother    Cancer Brother        bone   Diabetes Brother    Heart attack Brother    Healthy Son    Healthy Son    Healthy Son    Healthy Son    Kidney failure Other        on HD   Breast cancer Neg Hx     Social History   Tobacco Use   Smoking status: Never    Passive exposure: Past   Smokeless tobacco: Never   Tobacco comments:    Never smoke 08/29/21  Vaping Use   Vaping Use: Never used  Substance Use Topics   Alcohol use: No    Alcohol/week: 0.0 standard drinks of alcohol   Drug use: No     Current Outpatient Medications:    ALPRAZolam (XANAX) 0.5 MG tablet, Take 1 tablet (0.5 mg total) by mouth 3 (three) times daily as needed for anxiety., Disp: 270 tablet, Rfl: 2   antiseptic oral rinse (BIOTENE) LIQD, 15 mLs by Mouth Rinse route 2 (two) times daily as needed for dry mouth., Disp: , Rfl:    cholecalciferol (VITAMIN D) 1000 UNITS tablet, Take 1,000 Units by mouth daily., Disp: , Rfl:    co-enzyme Q-10 30 MG capsule, Take 30 mg by mouth daily., Disp: , Rfl:    diltiazem (CARDIZEM CD) 360 MG 24 hr capsule, TAKE 1 CAPSULE BY MOUTH  DAILY, Disp: 100 capsule, Rfl: 2   docusate sodium (COLACE) 100 MG capsule, Take 100 mg  by mouth daily., Disp: , Rfl:    dofetilide (TIKOSYN) 250 MCG capsule, TAKE 1 CAPSULE(250 MCG) BY MOUTH TWICE DAILY, Disp: 180 capsule, Rfl: 1   ezetimibe (ZETIA) 10 MG tablet, TAKE 1 TABLET BY MOUTH AT  BEDTIME, Disp: 90 tablet, Rfl: 3   furosemide (LASIX) 20 MG tablet, Take 3 tablets (60 mg total) by mouth daily., Disp: 270 tablet, Rfl: 3   gabapentin (  NEURONTIN) 300 MG capsule, TAKE 2 CAPSULES BY MOUTH IN THE MORNING , 1 CAPSULE IN  THE AFTERNOON AND 2  CAPSULES AT BEDTIME, Disp: 450 capsule, Rfl: 3   losartan (COZAAR) 50 MG tablet, TAKE 1 TABLET BY MOUTH  DAILY, Disp: 90 tablet, Rfl: 3   lovastatin (MEVACOR) 10 MG tablet, TAKE 1 TABLET BY MOUTH  EVERY MONDAY, WEDNESDAY,  AND FRIDAY, Disp: 39 tablet, Rfl: 3   metoprolol tartrate (LOPRESSOR) 50 MG tablet, TAKE 1 AND 1/2 TABLETS BY  MOUTH TWICE DAILY, Disp: 270 tablet, Rfl: 3   molnupiravir EUA (LAGEVRIO) 200 mg CAPS capsule, Take 4 capsules (800 mg total) by mouth 2 (two) times daily for 5 days., Disp: 40 capsule, Rfl: 0   pantoprazole (PROTONIX) 40 MG tablet, Take 40 mg by mouth daily., Disp: , Rfl:    Polyethyl Glycol-Propyl Glycol (SYSTANE) 0.4-0.3 % GEL ophthalmic gel, Place 1 drop into both eyes 2 (two) times daily. , Disp: , Rfl:    RESTASIS 0.05 % ophthalmic emulsion, INSTILL ONE DROP IN BOTH  EYES TWO TIMES DAILY, Disp: 180 each, Rfl: 0   traMADol (ULTRAM) 50 MG tablet, TAKE 1 TABLET BY MOUTH DAILY AS  NEEDED, Disp: 30 tablet, Rfl: 0   vitamin B-12 (CYANOCOBALAMIN) 1000 MCG tablet, Take 1 tablet (1,000 mcg total) by mouth every Monday, Wednesday, and Friday., Disp: , Rfl:    ELIQUIS 5 MG TABS tablet, TAKE 1 TABLET BY MOUTH  TWICE DAILY, Disp: 180 tablet, Rfl: 3   potassium chloride (KLOR-CON) 10 MEQ tablet, Take 1 tablet (10 mEq total) by mouth daily., Disp: 90 tablet, Rfl: 3   temazepam (RESTORIL) 30 MG capsule, Take 1 capsule (30 mg total) by mouth at bedtime as needed for sleep. (Patient not taking: Reported on 09/21/2021), Disp: 30  capsule, Rfl: 2  Allergies  Allergen Reactions   Cymbalta [Duloxetine Hcl] Other (See Comments)    tachycardia   Statins Nausea Only and Other (See Comments)    Muscle cramps also   Sulfa Antibiotics Nausea And Vomiting    ROS: See pertinent positives and negatives per HPI.  OBSERVATIONS/OBJECTIVE:  VITALS per patient if applicable: There were no vitals filed for this visit.   ASSESSMENT AND PLAN:  Problem List Items Addressed This Visit       Other   COVID-19 - Primary    Symptoms started yesterday and she had a [facility] test [facility].  With her medications, will have her start molnupiravir twice a day for 5 days.  Encouraged her to rest, drink plenty of fluids.  Reviewed home care instructions for COVID. Advised self-isolation at home for at least 5 days. After 5 days, if improved and fever resolved, can be in public, but should wear a mask around others for an additional 5 days. If symptoms, esp, dyspnea develops/worsens, recommend in-person evaluation at either an urgent care or the emergency room.       Relevant Medications   molnupiravir EUA (LAGEVRIO) 200 mg CAPS capsule    I discussed the assessment and treatment plan with the patient. The patient was provided an opportunity to ask questions and all were answered. The patient agreed with the plan and demonstrated an understanding of the instructions.   The patient was advised to call back or seek an in-person evaluation if the symptoms worsen or if the condition fails to improve as anticipated.  I spent 20 minutes on this telephone encounter.  Charyl Dancer, NP

## 2021-09-21 NOTE — Patient Instructions (Signed)
It was great to see you!  Start molnupiravir 4 capsules twice a day for 5 days. Make sure you are getting rest and drinking plenty of fluids. You can take tylenol as needed for fever.   Wear a mask if you need to go out in public for the next 10 days, however try to stay home for 5 days.  Let's follow-up if your symptoms worsen or with any concerns.   Take care,  Vance Peper, NP

## 2021-09-21 NOTE — Progress Notes (Signed)
Called pt 2x to start video visit, no answr. Not able to lvm

## 2021-09-21 NOTE — Assessment & Plan Note (Signed)
Symptoms started yesterday and she had a positive [facility] test [facility].  With her medications, will have her start molnupiravir twice a day for 5 days.  Encouraged her to rest, drink plenty of fluids.  Reviewed home care instructions for COVID. Advised self-isolation at home for at least 5 days. After 5 days, if improved and fever resolved, can be in public, but should wear a mask around others for an additional 5 days. If symptoms, esp, dyspnea develops/worsens, recommend in-person evaluation at either an urgent care or the emergency room.

## 2021-09-26 ENCOUNTER — Other Ambulatory Visit: Payer: Self-pay | Admitting: Family Medicine

## 2021-09-26 DIAGNOSIS — Z1231 Encounter for screening mammogram for malignant neoplasm of breast: Secondary | ICD-10-CM

## 2021-09-27 NOTE — Progress Notes (Signed)
Office Visit Note  Patient: Alexandria Leach             Date of Birth: June 20, 1950           MRN: [id number]             PCP: Ria Bush, MD Referring: Ria Bush, MD Visit Date: 10/11/2021 Occupation: '@GUAROCC'$ @  Subjective:  Dry mouth and dry eyes  History of Present Illness: Alexandria Leach is a 71 y.o. female with history of Sjogren's syndrome and osteoarthritis.  She states she fell in April and landed on her knees.  She started having increased pain in her bilateral knee joints after the fall.  She states she had right knee joint cortisone injection by Dr. Edilia Bo which relieved her discomfort.  She still has intermittent discomfort in her left knee joint.  She has been using knee braces which have been helpful.  She denies any history of joint swelling.  None of the other joints are painful.  She denies any history of oral ulcers, nasal ulcers, malar rash, photosensitivity, Raynaud's phenomenon or lymphadenopathy.  Activities of Daily Living:  Patient reports morning stiffness for 45-60 minutes.   Patient Reports nocturnal pain.  Difficulty dressing/grooming: Denies Difficulty climbing stairs: Reports Difficulty getting out of chair: Denies Difficulty using hands for taps, buttons, cutlery, and/or writing: Denies  Review of Systems  Constitutional:  Positive for fatigue.  HENT:  Positive for mouth dryness. Negative for mouth sores.   Eyes:  Positive for dryness.  Respiratory:  Negative for shortness of breath.   Cardiovascular:  Negative for chest pain and palpitations.  Gastrointestinal:  Negative for blood in stool, constipation and diarrhea.  Endocrine: Negative for increased urination.  Genitourinary:  Negative for involuntary urination.  Musculoskeletal:  Positive for joint pain, joint pain, myalgias, morning stiffness, muscle tenderness and myalgias. Negative for joint swelling and muscle weakness.  Skin:  Negative for color change, rash, hair  loss and sensitivity to sunlight.  Allergic/Immunologic: Negative for susceptible to infections.  Neurological:  Negative for dizziness and headaches.  Hematological:  Negative for swollen glands.  Psychiatric/Behavioral:  Positive for depressed mood. Negative for sleep disturbance. The patient is nervous/anxious.     PMFS History:  Patient Active Problem List   Diagnosis Date Noted   Rhinorrhea 10/03/2021   Pruritus 10/03/2021   COVID-19 09/21/2021   Pedal edema 08/22/2021   Chronic pain of right knee 07/11/2021   Chronic GERD 01/30/2021   History of adenomatous polyp of colon 01/30/2021   Left sided abdominal pain 01/30/2021   Vitamin B1 deficiency 01/18/2021   Low serum vitamin B12 01/13/2021   Secondary hypercoagulable state ([place]) 10/23/2020   Prediabetes 07/11/2020   Snoring 06/23/2020   Atrial flutter ([place]) 06/23/2020   Dysphagia 05/15/2020   Secondary hyperparathyroidism of renal origin ([place]) 12/28/2019   Constipation 12/28/2019   Sinus tachycardia 05/27/2018   Transient alteration of awareness 04/04/2018   Abnormal serum thyroid stimulating hormone (TSH) level 01/30/2018   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/22/2017   Thyroid nodule 09/16/2017   Neck pain on right side 05/14/2017   Sialoadenitis of submandibular gland 10/29/2016   High risk medication use 06/05/2016   Peripheral neuropathy 01/30/2016   DNR no code (do not resuscitate) 12/08/2015   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   TIA (transient ischemic attack) 10/12/2015   Mild mitral regurgitation 10/12/2015   Osteopenia 09/18/2015   Right arm pain 08/29/2015   History of fall    Right sided  sciatica 02/21/2015   Imbalance 01/12/2015   Transaminitis 12/24/2014   DDD (degenerative disc disease), cervical    Health maintenance examination 10/18/2014   Advanced care planning/counseling discussion 10/18/2014   Medicare annual wellness visit, subsequent 10/18/2014   Refusal of blood  transfusions as patient is Jehovah's Witness    Sjogren's syndrome ([place])    CKD (chronic kidney disease) stage 3, GFR 30-59 ml/min (HCC)    Glaucoma    Fibromyalgia    Osteoarthritis    History of pulmonary embolism    Lung nodule 09/22/2012   (HFpEF) heart failure with preserved ejection fraction ([place]) 12/04/2011   Aortic stenosis 04/22/2011   Palpitations 08/10/2010   HOT FLASHES 06/28/2008   GAD (generalized anxiety disorder) 03/28/2006   History of mitral valve repair 03/28/2006   History of total abdominal hysterectomy 03/28/2006   HYPERCHOLESTEROLEMIA 12/31/2005   MDD (major depressive disorder), recurrent episode ([place]) 12/31/2005   RHEUMATIC HEART DISEASE 12/31/2005   Essential hypertension 12/31/2005   Chronic insomnia 12/31/2005    Past Medical History:  Diagnosis Date   Ankle fracture, left 02/19/2015   from a fall   Anxiety    Cataract 2014   corrected with surgery   CHF (congestive heart failure) (HCC)    CKD (chronic kidney disease) stage 3, GFR 30-59 ml/min (HCC)    saw nephrologist Dr Harden Mo   DDD (degenerative disc disease), cervical    Depression    Fibromyalgia    Glaucoma    s/p surgery, sees ophtho Q6 mo   History of DVT (deep vein thrombosis) several times latest 2012   receives coumadin while hospitalized   History of kidney stones 2010   History of pulmonary embolism 2001, 2006   completed coumadin courses   History of rheumatic fever x3   HLD (hyperlipidemia)    HTN (hypertension)    Insomnia    Lung nodule 09/22/2012   RLL - 31m, stable since 2014. Thought benign.    Osteoarthritis    shoulders and knees, not RA per Dr DEstanislado Pandy positive ANA, positive Ro   Osteopenia 09/18/2015   DEXA T -1.1 hip, -0.2 spine 08/2015    Personal history of urinary calculi latest 2014   Pneumonia 12/02/2011   PONV (postoperative nausea and vomiting)    Refusal of blood transfusions as patient is Jehovah's Witness    Rheumatic heart disease 1980   s/p mitral  valve repair 1980   S[place]ogren's syndrome ([place]    Trimalleolar fracture of left ankle 02/23/2015    Family History  Problem Relation Age of Onset   Cancer Mother        lung (nonsmoker)   CAD Mother        MI in her 619s  ALS Mother    Kidney disease Father    Alcohol abuse Father    Diabetes Father    Lupus Sister        and niece   Diabetes Sister    Stroke Sister    Depression Sister    Lupus Sister    Blindness Sister    Cancer Maternal Uncle        bone   Stroke Maternal Grandmother    Cancer Brother        bone   Diabetes Brother    Heart attack Brother    Healthy Son    Healthy Son    Healthy Son    Healthy Son    Kidney failure Other  on HD   Breast cancer Neg Hx    Past Surgical History:  Procedure Laterality Date   BREAST BIOPSY Right 2006   benign   CARDIOVERSION N/A 07/18/2020   Procedure: CARDIOVERSION;  Surgeon: Elouise Munroe, MD;  Location[facility];  Service: Cardiovascular;  Laterality: N/A;   CHOLECYSTECTOMY  11/27/2011   Procedure: LAPAROSCOPIC CHOLECYSTECTOMY WITH INTRAOPERATIVE CHOLANGIOGRAM;  Surgeon: Adin Hector, MD;  Location: [facility];  Service: General;  Laterality: N/A;  laparoscopic cholecystectomy with choleangiogram umbilical hernia repair   COLONOSCOPY  07/2014   WNL [facility])   COLONOSCOPY  08/2019   3 TAs, rpt 3 yrs ([place])   dexa  08/2012   normal per patient - no records available   ESOPHAGOGASTRODUODENOSCOPY  08/2019   reactive gastropathy, neg H pylori ([place])   EYE SURGERY Bilateral 2014   cataract removal   MITRAL VALVE REPAIR  1980   open heart   ORIF ANKLE FRACTURE Left 02/26/2015   Procedure: OPEN REDUCTION INTERNAL FIXATION (ORIF) LEFT TRIMALLEOLAR ANKLE FRACTURE;  Surgeon: [name], MD;  Location: [facility];  Service: Orthopedics;  Laterality: Left;   TUBAL LIGATION  0272   UMBILICAL HERNIA REPAIR  11/27/2011   Procedure: HERNIA REPAIR UMBILICAL ADULT;  Surgeon: Adin Hector, MD;  Location: [facility];  Service: General;  Laterality: N/A;   [place]   for fibroids -- partial, ovaries remain   Social History   Social History Narrative   Lives with son, 1 dog   Occupation: unemployed, on disability for fibromyalgia since 2008.   Edu: HS   Religion: Jehova's witness   Activity: volunteers at senior center   Diet: some water, fruits/vegetables daily   No caffeine use   Immunization History  Administered Date(s) Administered   PFIZER(Purple Top)SARS-COV-2 Vaccination 05/12/2019, 06/09/2019, 01/06/2020   Pfizer Covid-19 Vaccine Bivalent Booster 9yr & up 02/13/2021   Pneumococcal Conjugate-13 12/15/2017   Pneumococcal Polysaccharide-23 09/15/2012, 12/22/2018     Objective: Vital Signs: BP (!) 150/81 (BP Location: Left Arm, Patient Position: Sitting, Cuff Size: Normal)   Pulse 74   Ht '5\' 1"'$  (1.549 m)   Wt 193 lb 6.4 oz (87.7 kg)   BMI 36.54 kg/m    Physical Exam Vitals and nursing note reviewed.  Constitutional:      Appearance: She is well-developed.  HENT:     Head: Normocephalic and atraumatic.  Eyes:     Conjunctiva/sclera: Conjunctivae normal.  Cardiovascular:     Rate and Rhythm: Normal rate and regular rhythm.     Heart sounds: Normal heart sounds.  Pulmonary:     Effort: Pulmonary effort is normal.     Breath sounds: Normal breath sounds.  Abdominal:     General: Bowel sounds are normal.     Palpations: Abdomen is soft.  Musculoskeletal:     Cervical back: Normal range of motion.  Lymphadenopathy:     Cervical: No cervical adenopathy.  Skin:    General: Skin is warm and dry.     Capillary Refill: Capillary refill takes less than 2 seconds.  Neurological:     Mental Status: She is alert and oriented to person, place, and time.  Psychiatric:        Behavior: Behavior normal.      Musculoskeletal Exam: C-spine was in good range of motion.  Shoulder joints, elbow joints, wrist joints, MCPs PIPs and DIPs with good range of motion with  no synovitis.  Hip joints, knee joints, ankles,  MTPs and PIPs with good range of motion with no synovitis.  CDAI Exam: CDAI Score: -- Patient Global: --; Provider Global: -- Swollen: --; Tender: -- Joint Exam 10/11/2021   No joint exam has been documented for this visit   There is currently no information documented on the homunculus. Go to the Rheumatology activity and complete the homunculus joint exam.  Investigation: No additional findings.  Imaging: No results found.  Recent Labs: Lab Results  Component Value Date   WBC 4.8 05/14/2021   HGB 12.4 05/14/2021   PLT 247 05/14/2021   NA 140 08/14/2021   K 4.2 08/14/2021   CL 105 08/14/2021   CO2 20 08/14/2021   GLUCOSE 102 (H) 08/14/2021   BUN 15 08/14/2021   CREATININE 1.35 (H) 08/14/2021   BILITOT 0.4 05/14/2021   ALKPHOS 78 05/29/2020   AST 21 05/14/2021   ALT 19 05/14/2021   PROT 7.2 05/14/2021   PROT 7.5 05/14/2021   ALBUMIN 4.0 07/11/2020   CALCIUM 9.7 08/14/2021   GFRAA 51 (L) 11/11/2019    Speciality Comments: PLQ Eye Exam: 08/03/2018 WNL @ [facility] Follow up in 1 year  Procedures:  No procedures performed Allergies: Cymbalta [duloxetine hcl], Statins, and Sulfa antibiotics   Assessment / Plan:     Visit Diagnoses: Sjogren's syndrome with keratoconjunctivitis sicca ([place]) - +ANA, +Ro:  -She continues to have dry mouth and dry eye symptoms.  She is also taking several medications which could be contributing to her dry eye symptoms.  She could not tolerate hydroxychloroquine in the past.  I do not see the need for immunosuppression at this time.  Plan: Urinalysis, Routine w reflex microscopic, C3 and C4, Anti-DNA antibody, double-stranded, Sedimentation rate  High risk medication use - Discontinued PLQ in Feb 2021.  She is not taking any immunosuppressive agents at this time. - Plan: CBC with Differential/Platelet, COMPLETE METABOLIC PANEL WITH GFR  Primary osteoarthritis of right knee -she had a recent  fall in April which caused increased discomfort in her knee joints.  She had right knee joint injection in April by Dr. Edilia Bo.  She states that the injection relieved the discomfort.  I reviewed her x-rays from April 2023 which were consistent with osteoarthritis.  She had no warmth swelling or effusion in her knee joints today.  A handout on knee joint exercises was given.  DDD (degenerative disc disease), cervical-she continues to have a stiffness in her cervical spine.  Osteopenia of multiple sites -Jul 16, 2021 repeat DEXA scan which showed T score of -1.1 and BMD 0.880 in the left femoral neck.  Her BMD is mostly unchanged.  Use of calcium rich diet and exercise was emphasized.  Fibromyalgia-she continues to have some generalized pain and discomfort from fibromyalgia.  She has been on tramadol which she takes only on as needed basis.  Other chronic pain - Tramadol as needed for pain relief. UDS: 05/14/2021 & narcotic agreement: 06/11/2021.  I will get UDS today.  Other insomnia-she gives history of chronic insomnia.  Other fatigue-fatigue related to insomnia.  Other medical problems are listed as follows:  Stage 3a chronic kidney disease ([place])  Typical atrial flutter ([place]) - hospital admission on 10/23/2020 at which time the patient initiated Tikosyn.   History of hypertension-blood pressure was elevated today.  She has been advised to monitor blood pressure closely and follow-up with her PCP.  History of hypercholesterolemia  History of pulmonary embolism  Chronic anticoagulation - She is on Eliquis 5 mg  twice daily for pulmonary embolism.  Thyroid nodule - Thyroid ultrasound obtained on 10/27/19.   History of anxiety  Orders: Orders Placed This Encounter  Procedures   Urinalysis, Routine w reflex microscopic   CBC with Differential/Platelet   COMPLETE METABOLIC PANEL WITH GFR   C3 and C4   Anti-DNA antibody, double-stranded   Sedimentation rate   DRUG MONITOR,  TRAMADOL,QN, URINE   DRUG MONITOR, PANEL 5, W/CONF, URINE   No orders of the defined types were placed in this encounter.   Follow-Up Instructions: Return in about 6 months (around 04/13/2022) for Sjogren's, Osteoarthritis.   Bo Merino, MD  Note - This record has been created using Editor, commissioning.  Chart creation errors have been sought, but may not always  have been located. Such creation errors do not reflect on  the standard of medical care.

## 2021-10-03 ENCOUNTER — Ambulatory Visit (INDEPENDENT_AMBULATORY_CARE_PROVIDER_SITE_OTHER): Payer: Medicare Other | Admitting: Family Medicine

## 2021-10-03 ENCOUNTER — Encounter: Payer: Self-pay | Admitting: Family Medicine

## 2021-10-03 VITALS — BP 148/86 | HR 75 | Temp 97.5°F | Ht 61.0 in | Wt 194.2 lb

## 2021-10-03 DIAGNOSIS — L299 Pruritus, unspecified: Secondary | ICD-10-CM | POA: Diagnosis not present

## 2021-10-03 DIAGNOSIS — J3489 Other specified disorders of nose and nasal sinuses: Secondary | ICD-10-CM | POA: Diagnosis not present

## 2021-10-03 MED ORDER — LORATADINE 10 MG PO TABS: 10.0000 mg | ORAL_TABLET | Freq: Every day | ORAL | 11 refills | Status: DC

## 2021-10-03 NOTE — Patient Instructions (Addendum)
For runny nose, try claritin or zyrtec allergy medicine in the mornings.  If ongoing itching at night, may take benadryl '25mg'$  at night for a few nights.  If reaction to molnupiravir, this should improve over next few days.  Continue moisturizing regularly.

## 2021-10-03 NOTE — Assessment & Plan Note (Addendum)
3 days of itching to bilateral upper and lower extremities, latest medication was molnupiravir.  Pruritus is not listed as a side effect but common side effects are skin rash and hives. No other inciting factor noted.  Discussed molnupiravir as possible cause.  She has now completed this course. We will try Claritin in the mornings and monitor pruritus at night, consider using Benadryl as needed for itching at night. We did review anticholinergic side effects of Benadryl, recommend try half tablet to start, need to really watch anticholinergic side effects in her history of Sjogren's. If ongoing, consider return for labwork.

## 2021-10-03 NOTE — Assessment & Plan Note (Signed)
Residual rhinorrhea and nasal congestion after COVID infection 2 weeks ago. Discussed supportive measures to include daily nonsedating antihistamine such as Claritin in the morning, as needed Benadryl in the even as per above.

## 2021-10-03 NOTE — Progress Notes (Signed)
Patient ID: Alexandria Leach, female    DOB: 09/29/1950, 71 y.o.   MRN: [id number]  This visit was conducted in person.  BP (!) 148/86 (BP Location: Right Arm, Cuff Size: Large)   Pulse 75   Temp (!) 97.5 F (36.4 C) (Temporal)   Ht '5\' 1"'  (1.549 m)   Wt 194 lb 4 oz (88.1 kg)   SpO2 97%   BMI 36.70 kg/m    CC: itching Subjective:   HPI: Alexandria Leach is a 71 y.o. female presenting on 10/03/2021 for Pruritis (C/o itching in bilateral arms/legs. Started 10/01/21.  Think due to COVID med.  Also, nasal congestion and runny nose. )   Recent COVID infection, first day of symptoms 09/20/2021, with positive COVID test on same date.  Symptoms included high fever Tmax 103, nasal congestion cough runny nose dyspnea headache facial pain and fatigue.  Seen virtually, started on molnupiravir twice a day for 5 days, completed course 09/25/2021.  Predominant congestion and rhinorrhea now, other symptoms have largely resolved. No fever, facial pain.   5d after completing molnupiravir she developed itching to arms, legs, feet, worse at night, which has persisted over the past 3 days.   No other new medicines, vitamins, supplements, detergents, soaps, shampoos, new foods.  No rash.      Relevant past medical, surgical, family and social history reviewed and updated as indicated. Interim medical history since our last visit reviewed. Allergies and medications reviewed and updated. Outpatient Medications Prior to Visit  Medication Sig Dispense Refill   ALPRAZolam (XANAX) 0.5 MG tablet Take 1 tablet (0.5 mg total) by mouth 3 (three) times daily as needed for anxiety. 270 tablet 2   antiseptic oral rinse (BIOTENE) LIQD 15 mLs by Mouth Rinse route 2 (two) times daily as needed for dry mouth.     cholecalciferol (VITAMIN D) 1000 UNITS tablet Take 1,000 Units by mouth daily.     co-enzyme Q-10 30 MG capsule Take 30 mg by mouth daily.     diltiazem (CARDIZEM CD) 360 MG 24 hr capsule TAKE 1  CAPSULE BY MOUTH  DAILY 100 capsule 2   docusate sodium (COLACE) 100 MG capsule Take 100 mg by mouth daily.     dofetilide (TIKOSYN) 250 MCG capsule TAKE 1 CAPSULE(250 MCG) BY MOUTH TWICE DAILY 180 capsule 1   ELIQUIS 5 MG TABS tablet TAKE 1 TABLET BY MOUTH  TWICE DAILY 180 tablet 3   ezetimibe (ZETIA) 10 MG tablet TAKE 1 TABLET BY MOUTH AT  BEDTIME 90 tablet 3   furosemide (LASIX) 20 MG tablet Take 3 tablets (60 mg total) by mouth daily. 270 tablet 3   gabapentin (NEURONTIN) 300 MG capsule TAKE 2 CAPSULES BY MOUTH IN THE MORNING , 1 CAPSULE IN  THE AFTERNOON AND 2  CAPSULES AT BEDTIME 450 capsule 3   losartan (COZAAR) 50 MG tablet TAKE 1 TABLET BY MOUTH  DAILY 90 tablet 3   lovastatin (MEVACOR) 10 MG tablet TAKE 1 TABLET BY MOUTH  EVERY MONDAY, WEDNESDAY,  AND FRIDAY 39 tablet 3   metoprolol tartrate (LOPRESSOR) 50 MG tablet TAKE 1 AND 1/2 TABLETS BY  MOUTH TWICE DAILY 270 tablet 3   pantoprazole (PROTONIX) 40 MG tablet Take 40 mg by mouth daily.     Polyethyl Glycol-Propyl Glycol (SYSTANE) 0.4-0.3 % GEL ophthalmic gel Place 1 drop into both eyes 2 (two) times daily.      RESTASIS 0.05 % ophthalmic emulsion INSTILL ONE DROP IN BOTH  EYES  TWO TIMES DAILY 180 each 0   temazepam (RESTORIL) 30 MG capsule Take 1 capsule (30 mg total) by mouth at bedtime as needed for sleep. 30 capsule 2   traMADol (ULTRAM) 50 MG tablet TAKE 1 TABLET BY MOUTH DAILY AS  NEEDED 30 tablet 0   vitamin B-12 (CYANOCOBALAMIN) 1000 MCG tablet Take 1 tablet (1,000 mcg total) by mouth every Monday, Wednesday, and Friday.     potassium chloride (KLOR-CON) 10 MEQ tablet Take 1 tablet (10 mEq total) by mouth daily. 90 tablet 3   No facility-administered medications prior to visit.     Per HPI unless specifically indicated in ROS section below Review of Systems  Objective:  BP (!) 148/86 (BP Location: Right Arm, Cuff Size: Large)   Pulse 75   Temp (!) 97.5 F (36.4 C) (Temporal)   Ht '5\' 1"'  (1.549 m)   Wt 194 lb 4 oz (88.1  kg)   SpO2 97%   BMI 36.70 kg/m   Wt Readings from Last 3 Encounters:  10/03/21 194 lb 4 oz (88.1 kg)  08/29/21 194 lb 9.6 oz (88.3 kg)  08/22/21 198 lb 4 oz (89.9 kg)      Physical Exam Vitals and nursing note reviewed.  Constitutional:      Appearance: Normal appearance. She is not ill-appearing.     Comments: Wearing double mask  HENT:     Head: Normocephalic and atraumatic.     Right Ear: Tympanic membrane, ear canal and external ear normal. There is no impacted cerumen.     Left Ear: Tympanic membrane, ear canal and external ear normal. There is no impacted cerumen.     Nose: Congestion present. No rhinorrhea.     Mouth/Throat:     Mouth: Mucous membranes are moist.     Pharynx: Oropharynx is clear. No oropharyngeal exudate or posterior oropharyngeal erythema.  Eyes:     Extraocular Movements: Extraocular movements intact.     Conjunctiva/sclera: Conjunctivae normal.     Pupils: Pupils are equal, round, and reactive to light.  Cardiovascular:     Rate and Rhythm: Normal rate and regular rhythm.     Pulses: Normal pulses.     Heart sounds: Normal heart sounds. No murmur heard. Pulmonary:     Effort: Pulmonary effort is normal. No respiratory distress.     Breath sounds: Normal breath sounds. No wheezing, rhonchi or rales.  Musculoskeletal:     Cervical back: Normal range of motion and neck supple.     Right lower leg: No edema.     Left lower leg: No edema.  Lymphadenopathy:     Cervical: No cervical adenopathy.  Skin:    General: Skin is warm and dry.     Findings: No erythema or rash.  Neurological:     Mental Status: She is alert.  Psychiatric:        Mood and Affect: Mood normal.        Behavior: Behavior normal.       Results for orders placed or performed in visit on 16/10/96  Basic Metabolic Panel (BMET)  Result Value Ref Range   Glucose 102 (H) 70 - 99 mg/dL   BUN 15 8 - 27 mg/dL   Creatinine, Ser 1.35 (H) 0.57 - 1.00 mg/dL   eGFR 43 (L) >59  mL/min/1.73   BUN/Creatinine Ratio 11 (L) 12 - 28   Sodium 140 134 - 144 mmol/L   Potassium 4.2 3.5 - 5.2 mmol/L   Chloride 105 96 -  106 mmol/L   CO2 20 20 - 29 mmol/L   Calcium 9.7 8.7 - 10.3 mg/dL    Assessment & Plan:   Problem List Items Addressed This Visit     Rhinorrhea    Residual rhinorrhea and nasal congestion after COVID infection 2 weeks ago. Discussed supportive measures to include daily nonsedating antihistamine such as Claritin in the morning, as needed Benadryl in the even as per above.      Pruritus - Primary    3 days of itching to bilateral upper and lower extremities, latest medication was molnupiravir.  Pruritus is not listed as a side effect but common side effects are skin rash and hives. No other inciting factor noted.  Discussed molnupiravir as possible cause.  She has now completed this course. We will try Claritin in the mornings and monitor pruritus at night, consider using Benadryl as needed for itching at night. We did review anticholinergic side effects of Benadryl, recommend try half tablet to start, need to really watch anticholinergic side effects in her history of Sjogren's. If ongoing, consider return for labwork.         Meds ordered this encounter  Medications   loratadine (CLARITIN) 10 MG tablet    Sig: Take 1 tablet (10 mg total) by mouth daily.    Refill:  11   No orders of the defined types were placed in this encounter.    Patient Instructions  For runny nose, try claritin or zyrtec allergy medicine in the mornings.  If ongoing itching at night, may take benadryl 55m at night for a few nights.  If reaction to molnupiravir, this should improve over next few days.  Continue moisturizing regularly.   Follow up plan: Return if symptoms worsen or fail to improve.  JRia Bush MD

## 2021-10-05 ENCOUNTER — Other Ambulatory Visit: Payer: Self-pay | Admitting: Family Medicine

## 2021-10-08 NOTE — Telephone Encounter (Signed)
Gabapentin Last filled:  08/01/21, #450 Last OV:  10/03/21, pruritus Next OV:  01/11/22, CPE

## 2021-10-11 ENCOUNTER — Encounter: Payer: Self-pay | Admitting: Rheumatology

## 2021-10-11 ENCOUNTER — Ambulatory Visit (INDEPENDENT_AMBULATORY_CARE_PROVIDER_SITE_OTHER): Payer: Medicare Other | Admitting: Rheumatology

## 2021-10-11 VITALS — BP 150/81 | HR 74 | Ht 61.0 in | Wt 193.4 lb

## 2021-10-11 DIAGNOSIS — N1831 Chronic kidney disease, stage 3a: Secondary | ICD-10-CM

## 2021-10-11 DIAGNOSIS — M797 Fibromyalgia: Secondary | ICD-10-CM | POA: Diagnosis not present

## 2021-10-11 DIAGNOSIS — Z8639 Personal history of other endocrine, nutritional and metabolic disease: Secondary | ICD-10-CM

## 2021-10-11 DIAGNOSIS — G8929 Other chronic pain: Secondary | ICD-10-CM | POA: Diagnosis not present

## 2021-10-11 DIAGNOSIS — M1711 Unilateral primary osteoarthritis, right knee: Secondary | ICD-10-CM

## 2021-10-11 DIAGNOSIS — Z5181 Encounter for therapeutic drug level monitoring: Secondary | ICD-10-CM | POA: Diagnosis not present

## 2021-10-11 DIAGNOSIS — M5030 Other cervical disc degeneration, unspecified cervical region: Secondary | ICD-10-CM | POA: Diagnosis not present

## 2021-10-11 DIAGNOSIS — M8589 Other specified disorders of bone density and structure, multiple sites: Secondary | ICD-10-CM

## 2021-10-11 DIAGNOSIS — M3501 Sicca syndrome with keratoconjunctivitis: Secondary | ICD-10-CM | POA: Diagnosis not present

## 2021-10-11 DIAGNOSIS — R5383 Other fatigue: Secondary | ICD-10-CM

## 2021-10-11 DIAGNOSIS — I483 Typical atrial flutter: Secondary | ICD-10-CM

## 2021-10-11 DIAGNOSIS — G4709 Other insomnia: Secondary | ICD-10-CM

## 2021-10-11 DIAGNOSIS — Z7901 Long term (current) use of anticoagulants: Secondary | ICD-10-CM

## 2021-10-11 DIAGNOSIS — Z79899 Other long term (current) drug therapy: Secondary | ICD-10-CM

## 2021-10-11 DIAGNOSIS — Z86711 Personal history of pulmonary embolism: Secondary | ICD-10-CM

## 2021-10-11 DIAGNOSIS — E041 Nontoxic single thyroid nodule: Secondary | ICD-10-CM

## 2021-10-11 DIAGNOSIS — Z8679 Personal history of other diseases of the circulatory system: Secondary | ICD-10-CM | POA: Diagnosis not present

## 2021-10-11 DIAGNOSIS — Z8659 Personal history of other mental and behavioral disorders: Secondary | ICD-10-CM

## 2021-10-11 NOTE — Patient Instructions (Signed)
Knee Exercises Ask your health care provider which exercises are safe for you. Do exercises exactly as told by your health care provider and adjust them as directed. It is normal to feel mild stretching, pulling, tightness, or discomfort as you do these exercises. Stop right away if you feel sudden pain or your pain gets worse. Do not begin these exercises until told by your health care provider. Stretching and range-of-motion exercises These exercises warm up your muscles and joints and improve the movement and flexibility of your knee. These exercises also help to relieve pain and swelling. Knee extension, prone  Lie on your abdomen (prone position) on a bed. Place your left / right knee just beyond the edge of the surface so your knee is not on the bed. You can put a towel under your left / right thigh just above your kneecap for comfort. Relax your leg muscles and allow gravity to straighten your knee (extension). You should feel a stretch behind your left / right knee. Hold this position for __________ seconds. Scoot up so your knee is supported between repetitions. Repeat __________ times. Complete this exercise __________ times a day. Knee flexion, active  Lie on your back with both legs straight. If this causes back discomfort, bend your left / right knee so your foot is flat on the floor. Slowly slide your left / right heel back toward your buttocks. Stop when you feel a gentle stretch in the front of your knee or thigh (flexion). Hold this position for __________ seconds. Slowly slide your left / right heel back to the starting position. Repeat __________ times. Complete this exercise __________ times a day. Quadriceps stretch, prone  Lie on your abdomen on a firm surface, such as a bed or padded floor. Bend your left / right knee and hold your ankle. If you cannot reach your ankle or pant leg, loop a belt around your foot and grab the belt instead. Gently pull your heel toward your  buttocks. Your knee should not slide out to the side. You should feel a stretch in the front of your thigh and knee (quadriceps). Hold this position for __________ seconds. Repeat __________ times. Complete this exercise __________ times a day. Hamstring, supine  Lie on your back (supine position). Loop a belt or towel over the ball of your left / right foot. The ball of your foot is on the walking surface, right under your toes. Straighten your left / right knee and slowly pull on the belt to raise your leg until you feel a gentle stretch behind your knee (hamstring). Do not let your knee bend while you do this. Keep your other leg flat on the floor. Hold this position for __________ seconds. Repeat __________ times. Complete this exercise __________ times a day. Strengthening exercises These exercises build strength and endurance in your knee. Endurance is the ability to use your muscles for a long time, even after they get tired. Quadriceps, isometric This exercise strengthens the muscles in front of your thigh (quadriceps) without moving your knee joint (isometric). Lie on your back with your left / right leg extended and your other knee bent. Put a rolled towel or small pillow under your knee if told by your health care provider. Slowly tense the muscles in the front of your left / right thigh. You should see your kneecap slide up toward your hip or see increased dimpling just above the knee. This motion will push the back of the knee toward the floor.   For __________ seconds, hold the muscle as tight as you can without increasing your pain. Relax the muscles slowly and completely. Repeat __________ times. Complete this exercise __________ times a day. Straight leg raises This exercise strengthens the muscles in front of your thigh (quadriceps) and the muscles that move your hips (hip flexors). Lie on your back with your left / right leg extended and your other knee bent. Tense the  muscles in the front of your left / right thigh. You should see your kneecap slide up or see increased dimpling just above the knee. Your thigh may even shake a bit. Keep these muscles tight as you raise your leg 4-6 inches (10-15 cm) off the floor. Do not let your knee bend. Hold this position for __________ seconds. Keep these muscles tense as you lower your leg. Relax your muscles slowly and completely after each repetition. Repeat __________ times. Complete this exercise __________ times a day. Hamstring, isometric  Lie on your back on a firm surface. Bend your left / right knee about __________ degrees. Dig your left / right heel into the surface as if you are trying to pull it toward your buttocks. Tighten the muscles in the back of your thighs (hamstring) to "dig" as hard as you can without increasing any pain. Hold this position for __________ seconds. Release the tension gradually and allow your muscles to relax completely for __________ seconds after each repetition. Repeat __________ times. Complete this exercise __________ times a day. Hamstring curls If told by your health care provider, do this exercise while wearing ankle weights. Begin with __________lb / kg weights. Then increase the weight by 1 lb (0.5 kg) increments. Do not wear ankle weights that are more than __________lb / kg. Lie on your abdomen with your legs straight. Bend your left / right knee as far as you can without feeling pain. Keep your hips flat against the floor. Hold this position for __________ seconds. Slowly lower your leg to the starting position. Repeat __________ times. Complete this exercise __________ times a day. Squats This exercise strengthens the muscles in front of your thigh and knee (quadriceps). Stand in front of a table, with your feet and knees pointing straight ahead. You may rest your hands on the table for balance but not for support. Slowly bend your knees and lower your hips like you  are going to sit in a chair. Keep your weight over your heels, not over your toes. Keep your lower legs upright so they are parallel with the table legs. Do not let your hips go lower than your knees. Do not bend lower than told by your health care provider. If your knee pain increases, do not bend as low. Hold the squat position for __________ seconds. Slowly push with your legs to return to standing. Do not use your hands to pull yourself to standing. Repeat __________ times. Complete this exercise __________ times a day. Wall slides This exercise strengthens the muscles in front of your thigh and knee (quadriceps). Lean your back against a smooth wall or door, and walk your feet out 18-24 inches (46-61 cm) from it. Place your feet hip-width apart. Slowly slide down the wall or door until your knees bend __________ degrees. Keep your knees over your heels, not over your toes. Keep your knees in line with your hips. Hold this position for __________ seconds. Repeat __________ times. Complete this exercise __________ times a day. Straight leg raises, side-lying This exercise strengthens the muscles that rotate   the leg at the hip and move it away from your body (hip abductors). Lie on your side with your left / right leg in the top position. Lie so your head, shoulder, knee, and hip line up. You may bend your bottom knee to help you keep your balance. Roll your hips slightly forward so your hips are stacked directly over each other and your left / right knee is facing forward. Leading with your heel, lift your top leg 4-6 inches (10-15 cm). You should feel the muscles in your outer hip lifting. Do not let your foot drift forward. Do not let your knee roll toward the ceiling. Hold this position for __________ seconds. Slowly return your leg to the starting position. Let your muscles relax completely after each repetition. Repeat __________ times. Complete this exercise __________ times a  day. Straight leg raises, prone This exercise stretches the muscles that move your hips away from the front of the pelvis (hip extensors). Lie on your abdomen on a firm surface. You can put a pillow under your hips if that is more comfortable. Tense the muscles in your buttocks and lift your left / right leg about 4-6 inches (10-15 cm). Keep your knee straight as you lift your leg. Hold this position for __________ seconds. Slowly lower your leg to the starting position. Let your leg relax completely after each repetition. Repeat __________ times. Complete this exercise __________ times a day. This information is not intended to replace advice given to you by your health care provider. Make sure you discuss any questions you have with your health care provider. Document Revised: 11/14/2020 Document Reviewed: 11/14/2020 Elsevier Patient Education  2023 Elsevier Inc.  

## 2021-10-12 ENCOUNTER — Telehealth: Payer: Self-pay | Admitting: Family Medicine

## 2021-10-12 NOTE — Telephone Encounter (Signed)
Alexandria Leach a care navigator with [facility] states need hand rails for her tub and a higher toilet seat. Call back number [phone] ext [phone]

## 2021-10-12 NOTE — Telephone Encounter (Signed)
Rx written and in Lisa's box.  

## 2021-10-14 LAB — URINALYSIS, ROUTINE W REFLEX MICROSCOPIC
Bacteria, UA: NONE SEEN /HPF
Bilirubin Urine: NEGATIVE
Glucose, UA: NEGATIVE
Hgb urine dipstick: NEGATIVE
Hyaline Cast: NONE SEEN /LPF
Ketones, ur: NEGATIVE
Nitrite: NEGATIVE
Protein, ur: NEGATIVE
RBC / HPF: NONE SEEN /HPF (ref 0–2)
Specific Gravity, Urine: 1.017 (ref 1.001–1.035)
WBC, UA: NONE SEEN /HPF (ref 0–5)
pH: 7.5 (ref 5.0–8.0)

## 2021-10-14 LAB — DRUG MONITOR, PANEL 5, W/CONF, URINE
Alphahydroxyalprazolam: 923 ng/mL — ABNORMAL HIGH (ref <25)
Alphahydroxymidazolam: NEGATIVE ng/mL (ref <50)
Alphahydroxytriazolam: NEGATIVE ng/mL (ref <50)
Aminoclonazepam: NEGATIVE ng/mL (ref <25)
Amphetamines: NEGATIVE ng/mL (ref <500)
Barbiturates: NEGATIVE ng/mL (ref <300)
Benzodiazepines: POSITIVE ng/mL — AB (ref <100)
Cocaine Metabolite: NEGATIVE ng/mL (ref <150)
Creatinine: 172.6 mg/dL (ref >=20.0)
Hydroxyethylflurazepam: NEGATIVE ng/mL (ref <50)
Lorazepam: NEGATIVE ng/mL (ref <50)
Marijuana Metabolite: NEGATIVE ng/mL (ref <20)
Methadone Metabolite: NEGATIVE ng/mL (ref <100)
Nordiazepam: NEGATIVE ng/mL (ref <50)
Opiates: NEGATIVE ng/mL (ref <100)
Oxazepam: 3433 ng/mL — ABNORMAL HIGH (ref <50)
Oxidant: NEGATIVE ug/mL (ref <200)
Oxycodone: NEGATIVE ng/mL (ref <100)
Temazepam: >6250 ng/mL — ABNORMAL HIGH (ref <50)
pH: 7.5 (ref 4.5–9.0)

## 2021-10-14 LAB — COMPLETE METABOLIC PANEL WITH GFR
AG Ratio: 1.3 (calc) (ref 1.0–2.5)
ALT: 28 U/L (ref 6–29)
AST: 38 U/L — ABNORMAL HIGH (ref 10–35)
Albumin: 3.9 g/dL (ref 3.6–5.1)
Alkaline phosphatase (APISO): 60 U/L (ref 37–153)
BUN/Creatinine Ratio: 8 (calc) (ref 6–22)
BUN: 10 mg/dL (ref 7–25)
CO2: 25 mmol/L (ref 20–32)
Calcium: 9.8 mg/dL (ref 8.6–10.4)
Chloride: 108 mmol/L (ref 98–110)
Creat: 1.28 mg/dL — ABNORMAL HIGH (ref 0.50–1.05)
Globulin: 3.1 g/dL (calc) (ref 1.9–3.7)
Glucose, Bld: 86 mg/dL (ref 65–99)
Potassium: 4.8 mmol/L (ref 3.5–5.3)
Sodium: 141 mmol/L (ref 135–146)
Total Bilirubin: 0.4 mg/dL (ref 0.2–1.2)
Total Protein: 7 g/dL (ref 6.1–8.1)
eGFR: 45 mL/min/{1.73_m2} — ABNORMAL LOW (ref >=60)

## 2021-10-14 LAB — ANTI-DNA ANTIBODY, DOUBLE-STRANDED: ds DNA Ab: <1 IU/mL

## 2021-10-14 LAB — CBC WITH DIFFERENTIAL/PLATELET
Absolute Monocytes: 582 cells/uL (ref 200–950)
Basophils Absolute: 31 cells/uL (ref 0–200)
Basophils Relative: 0.6 %
Eosinophils Absolute: 109 cells/uL (ref 15–500)
Eosinophils Relative: 2.1 %
HCT: 38.1 % (ref 35.0–45.0)
Hemoglobin: 12.1 g/dL (ref 11.7–15.5)
Lymphs Abs: 1352 cells/uL (ref 850–3900)
MCH: 27.6 pg (ref 27.0–33.0)
MCHC: 31.8 g/dL — ABNORMAL LOW (ref 32.0–36.0)
MCV: 86.8 fL (ref 80.0–100.0)
MPV: 10.5 fL (ref 7.5–12.5)
Monocytes Relative: 11.2 %
Neutro Abs: 3125 cells/uL (ref 1500–7800)
Neutrophils Relative %: 60.1 %
Platelets: 283 10*3/uL (ref 140–400)
RBC: 4.39 10*6/uL (ref 3.80–5.10)
RDW: 14 % (ref 11.0–15.0)
Total Lymphocyte: 26 %
WBC: 5.2 10*3/uL (ref 3.8–10.8)

## 2021-10-14 LAB — DRUG MONITOR, TRAMADOL,QN, URINE
Desmethyltramadol: NEGATIVE ng/mL (ref <100)
Tramadol: NEGATIVE ng/mL (ref <100)

## 2021-10-14 LAB — MICROSCOPIC MESSAGE

## 2021-10-14 LAB — C3 AND C4
C3 Complement: 152 mg/dL (ref 83–193)
C4 Complement: 55 mg/dL (ref 15–57)

## 2021-10-14 LAB — SEDIMENTATION RATE: Sed Rate: 80 mm/h — ABNORMAL HIGH (ref 0–30)

## 2021-10-14 LAB — DM TEMPLATE

## 2021-10-15 NOTE — Telephone Encounter (Signed)
Lvm rtn Kimberly[facility] call.  Need to find out where order needs to be faxed.    [Written order is in Copy on [employer].]

## 2021-10-16 NOTE — Telephone Encounter (Signed)
Lvm rtn Kimberly[facility] call.  Need to find out where order needs to be faxed.    [Written order is in Copy on [employer].]

## 2021-10-17 NOTE — Telephone Encounter (Signed)
Lvm rtn Kimberly[facility] call.  Need to find out where order needs to be faxed.    [Written order is in Copy on [employer].]

## 2021-10-18 NOTE — Telephone Encounter (Signed)
Spoke with Joelene Millin of [facility] asking for fax # to send written DME order. States she's not familiar with the companies in our area. I mentioned [employer] and that I would start with them.  Joelene Millin agrees and asks that we contact her at [phone] if we need any help getting things approved for pt.   Faxed order to [facility] at [phone].

## 2021-10-30 ENCOUNTER — Other Ambulatory Visit: Payer: Self-pay | Admitting: Family Medicine

## 2021-10-31 ENCOUNTER — Ambulatory Visit
Admission: RE | Admit: 2021-10-31 | Discharge: 2021-10-31 | Disposition: A | Discharge status: Home / Self Care | Payer: Medicare Other | Source: Ambulatory Visit | Attending: Family Medicine | Admitting: Family Medicine

## 2021-10-31 ENCOUNTER — Other Ambulatory Visit: Payer: Self-pay | Admitting: Family Medicine

## 2021-10-31 DIAGNOSIS — Z1231 Encounter for screening mammogram for malignant neoplasm of breast: Secondary | ICD-10-CM | POA: Diagnosis not present

## 2021-11-16 ENCOUNTER — Other Ambulatory Visit: Payer: Self-pay | Admitting: Nurse Practitioner

## 2021-11-16 DIAGNOSIS — I4892 Unspecified atrial flutter: Secondary | ICD-10-CM

## 2021-11-21 ENCOUNTER — Encounter (HOSPITAL_COMMUNITY): Payer: Self-pay | Admitting: Psychiatry

## 2021-11-21 ENCOUNTER — Telehealth (INDEPENDENT_AMBULATORY_CARE_PROVIDER_SITE_OTHER): Payer: Medicare Other | Admitting: Psychiatry

## 2021-11-21 DIAGNOSIS — F411 Generalized anxiety disorder: Secondary | ICD-10-CM

## 2021-11-21 DIAGNOSIS — F331 Major depressive disorder, recurrent, moderate: Secondary | ICD-10-CM | POA: Diagnosis not present

## 2021-11-21 MED ORDER — TEMAZEPAM 30 MG PO CAPS
30.0000 mg | ORAL_CAPSULE | Freq: Every evening | ORAL | 2 refills | Status: DC | PRN
Start: 2021-11-21 — End: 2022-02-18

## 2021-11-21 MED ORDER — ALPRAZOLAM 0.5 MG PO TABS: 0.5000 mg | ORAL_TABLET | Freq: Three times a day (TID) | ORAL | 2 refills | Status: DC | PRN

## 2021-11-21 NOTE — Progress Notes (Signed)
Virtual Visit via Video Note  I connected with Alexandria Leach on 11/21/21 at 11:40 AM EDT by a video enabled telemedicine application and verified that I am speaking with the correct person using two identifiers.  Location: Patient: home Provider: office   I discussed the limitations of evaluation and management by telemedicine and the availability of in person appointments. The patient expressed understanding and agreed to proceed.     I discussed the assessment and treatment plan with the patient. The patient was provided an opportunity to ask questions and all were answered. The patient agreed with the plan and demonstrated an understanding of the instructions.   The patient was advised to call back or seek an in-person evaluation if the symptoms worsen or if the condition fails to improve as anticipated.  I provided 15 minutes of non-face-to-face time during this encounter.   [name], MD  [facility] MD/PA/NP OP Progress Note  11/21/2021 11:49 AM Alexandria Leach  MRN:  [id number]  Chief Complaint:  Chief Complaint  Patient presents with   Depression   Anxiety   Follow-up   HPI: This patient is a 71 year old separated black female who lives alone in [place].  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  She has 4 sons.  The patient returns for follow-up over 3 months.  She states overall she is doing okay.  She did have COVID over the summer but was able to get on Paxlovid and got over it fairly quickly.  Her cardiac status remains good with the Tikosyn.  She is no longer on any antidepressant but her mood remains good and she strives to stay busy and active with friends and her grandson.  She still has a lot of pain due to rheumatoid arthritis and Sjogren's syndrome but she tries not to take much pain medication.  She denies significant depression or anxiety or thoughts of self-harm or suicide.  She  is sleeping well with the temazepam Visit Diagnosis:    ICD-10-CM   1. Major depressive disorder, recurrent episode, moderate (HCC)  F33.1     2. GAD (generalized anxiety disorder)  F41.1       Past Psychiatric History: none  Past Medical History:  Past Medical History:  Diagnosis Date   Ankle fracture, left 02/19/2015   from a fall   Anxiety    Cataract 2014   corrected with surgery   CHF (congestive heart failure) (HCC)    CKD (chronic kidney disease) stage 3, GFR 30-59 ml/min (HCC)    saw nephrologist Dr Harden Mo   DDD (degenerative disc disease), cervical    Depression    Fibromyalgia    Glaucoma    s/p surgery, sees ophtho Q6 mo   History of DVT (deep vein thrombosis) several times latest 2012   receives coumadin while hospitalized   History of kidney stones 2010   History of pulmonary embolism 2001, 2006   completed coumadin courses   History of rheumatic fever x3   HLD (hyperlipidemia)    HTN (hypertension)    Insomnia    Lung nodule 09/22/2012   RLL - 62m, stable since 2014. Thought benign.    Osteoarthritis    shoulders and knees, not RA per Dr DEstanislado Pandy positive ANA, positive Ro   Osteopenia 09/18/2015   DEXA T -1.1 hip, -0.2 spine 08/2015    Personal history of urinary calculi latest 2014   Pneumonia 12/02/2011  PONV (postoperative nausea and vomiting)    Refusal of blood transfusions as patient is Jehovah's Witness    Rheumatic heart disease 1980   s/p mitral valve repair 1980   Sjogren's syndrome ([place])    Trimalleolar fracture of left ankle 02/23/2015    Past Surgical History:  Procedure Laterality Date   BREAST BIOPSY Right 2006   benign   CARDIOVERSION N/A 07/18/2020   Procedure: CARDIOVERSION;  Surgeon: Elouise Munroe, MD;  Location: [facility] ENDOSCOPY;  Service: Cardiovascular;  Laterality: N/A;   CHOLECYSTECTOMY  11/27/2011   Procedure: LAPAROSCOPIC CHOLECYSTECTOMY WITH INTRAOPERATIVE CHOLANGIOGRAM;  Surgeon: Adin Hector, MD;  Location: [place];   Service: General;  Laterality: N/A;  laparoscopic cholecystectomy with choleangiogram umbilical hernia repair   COLONOSCOPY  07/2014   WNL Amedeo Plenty)   COLONOSCOPY  08/2019   3 TAs, rpt 3 yrs ([place])   dexa  08/2012   normal per patient - no records available   ESOPHAGOGASTRODUODENOSCOPY  08/2019   reactive gastropathy, neg H pylori ([place])   EYE SURGERY Bilateral 2014   cataract removal   MITRAL VALVE REPAIR  1980   open heart   ORIF ANKLE FRACTURE Left 02/26/2015   Procedure: OPEN REDUCTION INTERNAL FIXATION (ORIF) LEFT TRIMALLEOLAR ANKLE FRACTURE;  Surgeon: [name], MD;  Location: [place];  Service: Orthopedics;  Laterality: Left;   TUBAL LIGATION  4765   UMBILICAL HERNIA REPAIR  11/27/2011   Procedure: HERNIA REPAIR UMBILICAL ADULT;  Surgeon: Adin Hector, MD;  Location: [place];  Service: General;  Laterality: N/A;   [place]   for fibroids -- partial, ovaries remain    Family Psychiatric History: See below  Family History:  Family History  Problem Relation Age of Onset   Cancer Mother        lung (nonsmoker)   CAD Mother        MI in her 20s   ALS Mother    Kidney disease Father    Alcohol abuse Father    Diabetes Father    Lupus Sister        and niece   Diabetes Sister    Stroke Sister    Depression Sister    Lupus Sister    Blindness Sister    Cancer Maternal Uncle        bone   Stroke Maternal Grandmother    Cancer Brother        bone   Diabetes Brother    Heart attack Brother    Healthy Son    Healthy Son    Healthy Son    Healthy Son    Kidney failure Other        on HD   Breast cancer Neg Hx     Social History:  Social History   Socioeconomic History   Marital status: Married    Spouse name: [name]   Number of children: 4   Years of education: 12   Highest education level: Not on file  Occupational History    Employer: UNEMPLOYED    Comment: Disability  Tobacco Use   Smoking status: Never    Passive  exposure: Past   Smokeless tobacco: Never   Tobacco comments:    Never smoke 08/29/21  Vaping Use   Vaping Use: Never used  Substance and Sexual Activity   Alcohol use: No    Alcohol/week: 0.0 standard drinks of alcohol   Drug use: No   Sexual activity: Not Currently  Birth control/protection: Surgical  Other Topics Concern   Not on file  Social History Narrative   Lives with son, 1 dog   Occupation: unemployed, on disability for fibromyalgia since 2008.   Edu: HS   Religion: Jehova's witness   Activity: volunteers at senior center   Diet: some water, fruits/vegetables daily   No caffeine use   Social Determinants of Health   Financial Resource Strain: Low Risk  (04/18/2021)   Overall Financial Resource Strain (CARDIA)    Difficulty of Paying Living Expenses: Not very hard  Food Insecurity: No Food Insecurity (02/12/2021)   Hunger Vital Sign    Worried About Running Out of Food in the Last Year: Never true    Ran Out of Food in the Last Year: Never true  Transportation Needs: No Transportation Needs (02/12/2021)   PRAPARE - Hydrologist (Medical): No    Lack of Transportation (Non-Medical): No  Physical Activity: Sufficiently Active (02/12/2021)   Exercise Vital Sign    Days of Exercise per Week: 7 days    Minutes of Exercise per Session: 50 min  Stress: No Stress Concern Present (02/12/2021)   [place]    Feeling of Stress : Only a little  Social Connections: Moderately Integrated (02/12/2021)   Social Connection and Isolation Panel [NHANES]    Frequency of Communication with Friends and Family: More than three times a week    Frequency of Social Gatherings with Friends and Family: Never    Attends Religious Services: More than 4 times per year    Active Member of [employer] or Organizations: No    Attends Archivist Meetings: Never    Marital Status: Married     Allergies:  Allergies  Allergen Reactions   Cymbalta [Duloxetine Hcl] Other (See Comments)    tachycardia   Statins Nausea Only and Other (See Comments)    Muscle cramps also   Sulfa Antibiotics Nausea And Vomiting    Metabolic Disorder Labs: Lab Results  Component Value Date   HGBA1C 6.0 (H) 01/12/2021   MPG 126 01/12/2021   MPG 114 10/13/2015   No results found for: "PROLACTIN" Lab Results  Component Value Date   CHOL 193 01/12/2021   TRIG 93 01/12/2021   HDL 70 01/12/2021   CHOLHDL 2.8 01/12/2021   VLDL 14.4 12/23/2019   LDLCALC 104 (H) 01/12/2021   LDLCALC 82 05/29/2020   Lab Results  Component Value Date   TSH 1.49 01/12/2021   TSH 4.12 12/23/2019    Therapeutic Level Labs: No results found for: "LITHIUM" No results found for: "VALPROATE" No results found for: "CBMZ"  Current Medications: Current Outpatient Medications  Medication Sig Dispense Refill   ALPRAZolam (XANAX) 0.5 MG tablet Take 1 tablet (0.5 mg total) by mouth 3 (three) times daily as needed for anxiety. 270 tablet 2   antiseptic oral rinse (BIOTENE) LIQD 15 mLs by Mouth Rinse route 2 (two) times daily as needed for dry mouth.     cholecalciferol (VITAMIN D) 1000 UNITS tablet Take 1,000 Units by mouth daily.     co-enzyme Q-10 30 MG capsule Take 30 mg by mouth daily.     diltiazem (CARDIZEM CD) 360 MG 24 hr capsule TAKE 1 CAPSULE BY MOUTH  DAILY 100 capsule 2   docusate sodium (COLACE) 100 MG capsule Take 100 mg by mouth daily.     dofetilide (TIKOSYN) 250 MCG capsule TAKE 1 CAPSULE(250 MCG) BY  MOUTH TWICE DAILY 180 capsule 1   ELIQUIS 5 MG TABS tablet TAKE 1 TABLET BY MOUTH TWICE  DAILY 200 tablet 1   ezetimibe (ZETIA) 10 MG tablet TAKE 1 TABLET BY MOUTH AT  BEDTIME 90 tablet 0   furosemide (LASIX) 20 MG tablet Take 3 tablets (60 mg total) by mouth daily. 270 tablet 3   gabapentin (NEURONTIN) 300 MG capsule TAKE 2 CAPSULES BY MOUTH IN THE MORNING 1 CAPSULE BY  MOUTH IN THE AFTERNOON AND  2  CAPSULES BY MOUTH AT  BEDTIME 450 capsule 3   loratadine (CLARITIN) 10 MG tablet Take 1 tablet (10 mg total) by mouth daily.  11   losartan (COZAAR) 50 MG tablet TAKE 1 TABLET BY MOUTH  DAILY 90 tablet 3   lovastatin (MEVACOR) 10 MG tablet TAKE 1 TABLET BY MOUTH EVERY  MONDAY, WEDNESDAY, AND FRIDAY 39 tablet 0   metoprolol tartrate (LOPRESSOR) 50 MG tablet TAKE 1 AND 1/2 TABLETS BY  MOUTH TWICE DAILY 270 tablet 3   pantoprazole (PROTONIX) 40 MG tablet Take 40 mg by mouth daily.     Polyethyl Glycol-Propyl Glycol (SYSTANE) 0.4-0.3 % GEL ophthalmic gel Place 1 drop into both eyes 2 (two) times daily.      potassium chloride (KLOR-CON) 10 MEQ tablet Take 1 tablet (10 mEq total) by mouth daily. 90 tablet 3   RESTASIS 0.05 % ophthalmic emulsion INSTILL ONE DROP IN BOTH  EYES TWO TIMES DAILY 180 each 0   temazepam (RESTORIL) 30 MG capsule Take 1 capsule (30 mg total) by mouth at bedtime as needed for sleep. 30 capsule 2   traMADol (ULTRAM) 50 MG tablet TAKE 1 TABLET BY MOUTH DAILY AS  NEEDED 30 tablet 0   vitamin B-12 (CYANOCOBALAMIN) 1000 MCG tablet Take 1 tablet (1,000 mcg total) by mouth every Monday, Wednesday, and Friday.     No current facility-administered medications for this visit.     Musculoskeletal: Strength & Muscle Tone: nl Gait & Station: normal Patient leans: N/A  Psychiatric Specialty Exam: Review of Systems  Constitutional:  Positive for fatigue.  Musculoskeletal:  Positive for arthralgias, back pain and myalgias.  All other systems reviewed and are negative.   There were no vitals taken for this visit.There is no height or weight on file to calculate BMI.  General Appearance: Casual and Fairly Groomed  Eye Contact:  Good  Speech:  Clear and Coherent  Volume:  Normal  Mood:  Euthymic  Affect:  Congruent  Thought Process:  Goal Directed  Orientation:  Full (Time, Place, and Person)  Thought Content: WDL   Suicidal Thoughts:  No  Homicidal Thoughts:  No  Memory:   Immediate;   Good Recent;   Good Remote;   Fair  Judgement:  Good  Insight:  Good  Psychomotor Activity:  Decreased  Concentration:  Concentration: Good and Attention Span: Good  Recall:  Good  Fund of Knowledge: Good  Language: Good  Akathisia:  No  Handed:  Right  AIMS (if indicated): not done  Assets:  Communication Skills Desire for Improvement Resilience Social Support Talents/Skills  ADL's:  Intact  Cognition: WNL  Sleep:  Good   Screenings: GAD-7    Flowsheet Row Office Visit from 12/28/2019 in [facility] Visit from 12/22/2018 in [place] at [facility]  Total GAD-7 Score 0 [place] from 12/11/2017 in [place] at [place]  from 12/09/2016 in [place] at [place] from 11/30/2015 in [place] at [facility]  Total Score (max 30 points ) '20 19 18      '$ PHQ2-9    Flowsheet Row Video Visit from 11/21/2021 in [place] Video Visit from 08/30/2021 in [facility] Video Visit from 05/22/2021 in [place] Video Visit from 03/30/2021 in [facility] [facility] Video Visit from 02/23/2021 in [facility]  PHQ-2 Total Score 0 0 0 1 0      Flowsheet Row Video Visit from 11/21/2021 in [place] Video Visit from 08/30/2021 in [place] [facility] from 06/29/2021 in [facility] [place] No Risk No Risk No Risk        Assessment and Plan: This patient is a 71 year old female with a history of depression and anxiety and Sjogren syndrome.  Her mood has been good without antidepressant.  She will continue Xanax 0.5 mg up  to 3 times daily as needed for anxiety and Restoril 30 mg at bedtime for sleep.  She will return to see me in 3 months  Collaboration of Care: Collaboration of Care: Primary Care Provider AEB notes are shared with PCP on the epic system  Patient/Guardian was advised Release of Information must be obtained prior to any record release in order to collaborate their care with an outside provider. Patient/Guardian was advised if they have not already done so to contact the registration department to sign all necessary forms in order for [place] to release information regarding their care.   Consent: Patient/Guardian gives verbal consent for treatment and assignment of benefits for services provided during this visit. Patient/Guardian expressed understanding and agreed to proceed.    [name], MD 11/21/2021, 11:49 AM

## 2021-12-02 ENCOUNTER — Other Ambulatory Visit: Payer: Self-pay | Admitting: Family Medicine

## 2021-12-02 DIAGNOSIS — I5032 Chronic diastolic (congestive) heart failure: Secondary | ICD-10-CM

## 2021-12-12 NOTE — Progress Notes (Signed)
HPI: FU mitral valve repair secondary to rheumatic heart disease and atrial flutter. Patient is status post mitral valve repair in 1980. Operative report not available. Nuclear study 11/14 showed EF 66 and normal perfusion. Holter 11/14 showed sinus with pacs, pvcs and brief PAT. Carotid Dopplers October 2019 showed no significant obstruction. Patient found to be in atrial flutter at previous office visit.  Patient underwent successful cardioversion in May 2022.  Atrial flutter recurred.  She was seen by Dr. Lovena Le and Phyllis Ginger recommended.  Echocardiogram August 2022 showed normal LV function, mild left ventricular hypertrophy, grade 2 diastolic dysfunction, mild left atrial enlargement, mild mitral stenosis with mean gradient 7 mmHg, mild mitral regurgitation, mild aortic stenosis with mean gradient 12 mmHg, mild to moderate aortic insufficiency.  Since last seen, the patient has dyspnea with more extreme activities but not with routine activities. It is relieved with rest. It is not associated with chest pain. There is no orthopnea, PND or pedal edema. There is no syncope or palpitations. There is no exertional chest pain.   Current Outpatient Medications  Medication Sig Dispense Refill   ALPRAZolam (XANAX) 0.5 MG tablet Take 1 tablet (0.5 mg total) by mouth 3 (three) times daily as needed for anxiety. 270 tablet 2   antiseptic oral rinse (BIOTENE) LIQD 15 mLs by Mouth Rinse route 2 (two) times daily as needed for dry mouth.     cholecalciferol (VITAMIN D) 1000 UNITS tablet Take 1,000 Units by mouth daily.     co-enzyme Q-10 30 MG capsule Take 30 mg by mouth daily.     diltiazem (CARDIZEM CD) 360 MG 24 hr capsule TAKE 1 CAPSULE BY MOUTH  DAILY 100 capsule 2   docusate sodium (COLACE) 100 MG capsule Take 100 mg by mouth daily.     dofetilide (TIKOSYN) 250 MCG capsule TAKE 1 CAPSULE(250 MCG) BY MOUTH TWICE DAILY 180 capsule 1   ELIQUIS 5 MG TABS tablet TAKE 1 TABLET BY MOUTH TWICE  DAILY 200  tablet 1   ezetimibe (ZETIA) 10 MG tablet TAKE 1 TABLET BY MOUTH AT  BEDTIME 90 tablet 0   furosemide (LASIX) 20 MG tablet Take 3 tablets (60 mg total) by mouth daily. 270 tablet 3   gabapentin (NEURONTIN) 300 MG capsule TAKE 2 CAPSULES BY MOUTH IN THE MORNING 1 CAPSULE BY  MOUTH IN THE AFTERNOON AND  2 CAPSULES BY MOUTH AT  BEDTIME 450 capsule 3   loratadine (CLARITIN) 10 MG tablet Take 1 tablet (10 mg total) by mouth daily.  11   losartan (COZAAR) 50 MG tablet TAKE 1 TABLET BY MOUTH  DAILY 90 tablet 3   lovastatin (MEVACOR) 10 MG tablet TAKE 1 TABLET BY MOUTH EVERY  MONDAY, WEDNESDAY, AND FRIDAY 39 tablet 0   metoprolol tartrate (LOPRESSOR) 50 MG tablet TAKE 1 AND 1/2 TABLETS BY  MOUTH TWICE DAILY 270 tablet 3   pantoprazole (PROTONIX) 40 MG tablet Take 40 mg by mouth daily.     Polyethyl Glycol-Propyl Glycol (SYSTANE) 0.4-0.3 % GEL ophthalmic gel Place 1 drop into both eyes 2 (two) times daily.      RESTASIS 0.05 % ophthalmic emulsion INSTILL ONE DROP IN BOTH  EYES TWO TIMES DAILY 180 each 0   temazepam (RESTORIL) 30 MG capsule Take 1 capsule (30 mg total) by mouth at bedtime as needed for sleep. 30 capsule 2   traMADol (ULTRAM) 50 MG tablet TAKE 1 TABLET BY MOUTH DAILY AS  NEEDED 30 tablet 0   vitamin  B-12 (CYANOCOBALAMIN) 1000 MCG tablet Take 1 tablet (1,000 mcg total) by mouth every Monday, Wednesday, and Friday.     potassium chloride (KLOR-CON) 10 MEQ tablet Take 1 tablet (10 mEq total) by mouth daily. 90 tablet 3   No current facility-administered medications for this visit.     Past Medical History:  Diagnosis Date   Ankle fracture, left 02/19/2015   from a fall   Anxiety    Cataract 2014   corrected with surgery   CHF (congestive heart failure) (HCC)    CKD (chronic kidney disease) stage 3, GFR 30-59 ml/min [facility])    saw nephrologist Dr Harden Mo   DDD (degenerative disc disease), cervical    Depression    Fibromyalgia    Glaucoma    s/p surgery, sees ophtho Q6 mo   History  of DVT (deep vein thrombosis) several times latest 2012   receives coumadin while hospitalized   History of kidney stones 2010   History of pulmonary embolism 2001, 2006   completed coumadin courses   History of rheumatic fever x3   HLD (hyperlipidemia)    HTN (hypertension)    Insomnia    Lung nodule 09/22/2012   RLL - 12m, stable since 2014. Thought benign.    Osteoarthritis    shoulders and knees, not RA per Dr DEstanislado Pandy positive ANA, positive Ro   Osteopenia 09/18/2015   DEXA T -1.1 hip, -0.2 spine 08/2015    Personal history of urinary calculi latest 2014   Pneumonia 12/02/2011   PONV (postoperative nausea and vomiting)    Refusal of blood transfusions as patient is Jehovah's Witness    Rheumatic heart disease 1980   s/p mitral valve repair 1980   S[place]ogren's syndrome ([place]    Trimalleolar fracture of left ankle 02/23/2015    Past Surgical History:  Procedure Laterality Date   BREAST BIOPSY Right 2006   benign   CARDIOVERSION N/A 07/18/2020   Procedure: CARDIOVERSION;  Surgeon: AElouise Munroe MD;  [facility]  Service: Cardiovascular;  Laterality: N/A;   CHOLECYSTECTOMY  11/27/2011   Procedure: LAPAROSCOPIC CHOLECYSTECTOMY WITH INTRAOPERATIVE CHOLANGIOGRAM;  Surgeon: HAdin Hector MD;  [place] [facility]  Service: General;  Laterality: N/A;  laparoscopic cholecystectomy with choleangiogram umbilical hernia repair   COLONOSCOPY  07/2014   WNL ([facility]med[facility]enty   COLONOSCOPY  08/2019   3 TAs, rpt 3 yrs ([facility])   dexa  08/2012   normal per patient - no records available   ESOPHAGOGASTRODUODENOSCOPY  08/2019   reactive gastropathy, neg H pylori ([facility]   EYE SURGERY Bilateral 2014   cataract removal   MITRAL VALVE REPAIR  1980   open heart   ORIF ANKLE FRACTURE Left 02/26/2015   Procedure: OPEN REDUCTION INTERNAL FIXATION (ORIF) LEFT TRIMALLEOLAR ANKLE FRACTURE;  Surgeon: NLeandrew Koyanagi MD;  [place] [facility]  Service: Orthopedics;  Laterality: Left;   TUBAL  LIGATION  11517  UMBILICAL HERNIA REPAIR  11/27/2011   Procedure: HERNIA REPAIR UMBILICAL ADULT;  Surgeon: HAdin Hector MD;  [facility]  Service: General;  Laterality: N/A;   VAGINAL HYSTERECTOMY  1992   for fibroids -- partial, ovaries remain    Social History   Socioeconomic History   Marital status: Married    Spouse name: RBarbaraann Rondo  Number of children: 4   Years of education: 12   Highest education level: Not on file  Occupational History    Employer: UNEMPLOYED    Comment: Disability  Tobacco Use  Smoking status: Never    Passive exposure: Past   Smokeless tobacco: Never   Tobacco comments:    Never smoke 08/29/21  Vaping Use   Vaping Use: Never used  Substance and Sexual Activity   Alcohol use: No    Alcohol/week: 0.0 standard drinks of alcohol   Drug use: No   Sexual activity: Not Currently    Birth control/protection: Surgical  Other Topics Concern   Not on file  Social History Narrative   Lives with son, 1 dog   Occupation: unemployed, on disability for fibromyalgia since 2008.   Edu: HS   Religion: Jehova's witness   Activity: volunteers at senior center   Diet: some water, fruits/vegetables daily   No caffeine use   Social Determinants of Health   Financial Resource Strain: Low Risk  (04/18/2021)   Overall Financial Resource Strain (CARDIA)    Difficulty of Paying Living Expenses: Not very hard  Food Insecurity: No Food Insecurity (02/12/2021)   Hunger Vital Sign    Worried About Running Out of Food in the Last Year: Never true    Ran Out of Food in the Last Year: Never true  Transportation Needs: No Transportation Needs (02/12/2021)   PRAPARE - Hydrologist (Medical): No    Lack of Transportation (Non-Medical): No  Physical Activity: Sufficiently Active (02/12/2021)   Exercise Vital Sign    Days of Exercise per Week: 7 days    Minutes of Exercise per Session: 50 min  Stress: No Stress Concern Present  (02/12/2021)   [place]    Feeling of Stress : Only a little  Social Connections: Moderately Integrated (02/12/2021)   Social Connection and Isolation Panel [NHANES]    Frequency of Communication with Friends and Family: More than three times a week    Frequency of Social Gatherings with Friends and Family: Never    Attends Religious Services: More than 4 times per year    Active Member of [employer] or Organizations: No    Attends Archivist Meetings: Never    Marital Status: Married  Human resources officer Violence: Not At Risk (02/12/2021)   Humiliation, Afraid, Rape, and Kick questionnaire    Fear of Current or Ex-Partner: No    Emotionally Abused: No    Physically Abused: No    Sexually Abused: No    Family History  Problem Relation Age of Onset   Cancer Mother        lung (nonsmoker)   CAD Mother        MI in her 110s   ALS Mother    Kidney disease Father    Alcohol abuse Father    Diabetes Father    Lupus Sister        and niece   Diabetes Sister    Stroke Sister    Depression Sister    Lupus Sister    Blindness Sister    Cancer Maternal Uncle        bone   Stroke Maternal Grandmother    Cancer Brother        bone   Diabetes Brother    Heart attack Brother    Healthy Son    Healthy Son    Healthy Son    Healthy Son    Kidney failure Other        on HD   Breast cancer Neg Hx     ROS: no fevers or chills,  productive cough, hemoptysis, dysphasia, odynophagia, melena, hematochezia, dysuria, hematuria, rash, seizure activity, orthopnea, PND, pedal edema, claudication. Remaining systems are negative.  Physical Exam: Well-developed well-nourished in no acute distress.  Skin is warm and dry.  HEENT is normal.  Neck is supple.  Chest is clear to auscultation with normal expansion.  Cardiovascular exam is regular rate and rhythm.  2/6 systolic murmur left sternal border. Abdominal exam  nontender or distended. No masses palpated. Extremities show no edema. neuro grossly intact  ECG-normal sinus rhythm at a rate of 72, normal axis, no significant ST changes.  Personally reviewed  A/P  1 atrial flutter-patient remains in sinus rhythm today.  Continue Tikosyn, Cardizem, metoprolol and apixaban.  Check potassium, magnesium and renal function.  2 history of mitral valve repair-continue SBE prophylaxis.  We will plan repeat echocardiogram.  3 chronic diastolic congestive heart failure-patient remains euvolemic.  We will continue diuretic at present dose.  4 hypertension-patient's blood pressure is controlled.  Continue present medical regimen.  5 hyperlipidemia-continue statin.  Check lipids and liver.  6 history of palpitations-continue beta-blocker.  Symptoms are reasonly well controlled.  Kirk Ruths, MD

## 2021-12-25 ENCOUNTER — Encounter: Payer: Self-pay | Admitting: Cardiology

## 2021-12-25 ENCOUNTER — Ambulatory Visit: Payer: Medicare Other | Attending: Cardiology | Admitting: Cardiology

## 2021-12-25 VITALS — BP 126/74 | HR 72 | Ht 61.0 in | Wt 199.0 lb

## 2021-12-25 DIAGNOSIS — I10 Essential (primary) hypertension: Secondary | ICD-10-CM | POA: Diagnosis not present

## 2021-12-25 DIAGNOSIS — I484 Atypical atrial flutter: Secondary | ICD-10-CM

## 2021-12-25 DIAGNOSIS — E7800 Pure hypercholesterolemia, unspecified: Secondary | ICD-10-CM

## 2021-12-25 DIAGNOSIS — I359 Nonrheumatic aortic valve disorder, unspecified: Secondary | ICD-10-CM | POA: Diagnosis not present

## 2021-12-25 DIAGNOSIS — Z9889 Other specified postprocedural states: Secondary | ICD-10-CM | POA: Diagnosis not present

## 2021-12-25 DIAGNOSIS — I5032 Chronic diastolic (congestive) heart failure: Secondary | ICD-10-CM

## 2021-12-25 LAB — COMPREHENSIVE METABOLIC PANEL
ALT: 22 IU/L (ref 0–32)
AST: 25 IU/L (ref 0–40)
Albumin/Globulin Ratio: 1.6 (ref 1.2–2.2)
Albumin: 4.4 g/dL (ref 3.9–4.9)
Alkaline Phosphatase: 70 IU/L (ref 44–121)
BUN/Creatinine Ratio: 9 — ABNORMAL LOW (ref 12–28)
BUN: 11 mg/dL (ref 8–27)
Bilirubin Total: 0.3 mg/dL (ref 0.0–1.2)
CO2: 22 mmol/L (ref 20–29)
Calcium: 10.1 mg/dL (ref 8.7–10.3)
Chloride: 108 mmol/L — ABNORMAL HIGH (ref 96–106)
Creatinine, Ser: 1.22 mg/dL — ABNORMAL HIGH (ref 0.57–1.00)
Globulin, Total: 2.8 g/dL (ref 1.5–4.5)
Glucose: 93 mg/dL (ref 70–99)
Potassium: 4.2 mmol/L (ref 3.5–5.2)
Sodium: 143 mmol/L (ref 134–144)
Total Protein: 7.2 g/dL (ref 6.0–8.5)
eGFR: 48 mL/min/{1.73_m2} — ABNORMAL LOW (ref >59)

## 2021-12-25 LAB — LIPID PANEL
Chol/HDL Ratio: 2.6 ratio (ref 0.0–4.4)
Cholesterol, Total: 201 mg/dL — ABNORMAL HIGH (ref 100–199)
HDL: 76 mg/dL (ref >39)
LDL Chol Calc (NIH): 112 mg/dL — ABNORMAL HIGH (ref 0–99)
Triglycerides: 72 mg/dL (ref 0–149)
VLDL Cholesterol Cal: 13 mg/dL (ref 5–40)

## 2021-12-25 LAB — MAGNESIUM: Magnesium: 1.9 mg/dL (ref 1.6–2.3)

## 2021-12-25 NOTE — Patient Instructions (Signed)
  Testing/Procedures:  Your physician has requested that you have an echocardiogram. Echocardiography is a painless test that uses sound waves to create images of your heart. It provides your doctor with information about the size and shape of your heart and how well your heart's chambers and valves are working. This procedure takes approximately one hour. There are no restrictions for this procedure. [place], you and your health needs are our priority.  As part of our continuing mission to provide you with exceptional heart care, we have created designated Provider Care Teams.  These Care Teams include your primary Cardiologist (physician) and Advanced Practice Providers (APPs -  Physician Assistants and Nurse Practitioners) who all work together to provide you with the care you need, when you need it.  We recommend signing up for the patient portal called "MyChart".  Sign up information is provided on this After Visit Summary.  MyChart is used to connect with patients for Virtual Visits (Telemedicine).  Patients are able to view lab/test results, encounter notes, upcoming appointments, etc.  Non-urgent messages can be sent to your provider as well.   To learn more about what you can do with MyChart, go to [email or web address].    Your next appointment:   12 month(s)  The format for your next appointment:   In Person  Provider:   Kirk Ruths, MD

## 2022-01-03 ENCOUNTER — Encounter: Payer: Self-pay | Admitting: Family Medicine

## 2022-01-03 ENCOUNTER — Other Ambulatory Visit: Payer: Self-pay | Admitting: Family Medicine

## 2022-01-03 DIAGNOSIS — E519 Thiamine deficiency, unspecified: Secondary | ICD-10-CM

## 2022-01-03 DIAGNOSIS — R7303 Prediabetes: Secondary | ICD-10-CM

## 2022-01-03 DIAGNOSIS — E7800 Pure hypercholesterolemia, unspecified: Secondary | ICD-10-CM

## 2022-01-03 DIAGNOSIS — R7989 Other specified abnormal findings of blood chemistry: Secondary | ICD-10-CM

## 2022-01-03 DIAGNOSIS — E538 Deficiency of other specified B group vitamins: Secondary | ICD-10-CM

## 2022-01-03 DIAGNOSIS — N1832 Chronic kidney disease, stage 3b: Secondary | ICD-10-CM

## 2022-01-03 DIAGNOSIS — M85852 Other specified disorders of bone density and structure, left thigh: Secondary | ICD-10-CM

## 2022-01-04 ENCOUNTER — Other Ambulatory Visit (INDEPENDENT_AMBULATORY_CARE_PROVIDER_SITE_OTHER): Payer: Medicare Other

## 2022-01-04 DIAGNOSIS — E519 Thiamine deficiency, unspecified: Secondary | ICD-10-CM

## 2022-01-04 DIAGNOSIS — R7989 Other specified abnormal findings of blood chemistry: Secondary | ICD-10-CM | POA: Diagnosis not present

## 2022-01-04 DIAGNOSIS — N1832 Chronic kidney disease, stage 3b: Secondary | ICD-10-CM

## 2022-01-04 DIAGNOSIS — R7303 Prediabetes: Secondary | ICD-10-CM | POA: Diagnosis not present

## 2022-01-04 DIAGNOSIS — M85852 Other specified disorders of bone density and structure, left thigh: Secondary | ICD-10-CM

## 2022-01-04 DIAGNOSIS — E538 Deficiency of other specified B group vitamins: Secondary | ICD-10-CM | POA: Diagnosis not present

## 2022-01-04 LAB — MICROALBUMIN / CREATININE URINE RATIO
Creatinine,U: 224.9 mg/dL
Microalb Creat Ratio: 0.3 mg/g (ref 0.0–30.0)
Microalb, Ur: <0.7 mg/dL (ref 0.0–1.9)

## 2022-01-04 LAB — T4, FREE: Free T4: 0.85 ng/dL (ref 0.60–1.60)

## 2022-01-04 LAB — HEMOGLOBIN A1C: Hgb A1c MFr Bld: 6 % (ref 4.6–6.5)

## 2022-01-04 LAB — TSH: TSH: 2 u[IU]/mL (ref 0.35–5.50)

## 2022-01-04 LAB — VITAMIN B12: Vitamin B-12: 1163 pg/mL — ABNORMAL HIGH (ref 211–911)

## 2022-01-04 LAB — VITAMIN D 25 HYDROXY (VIT D DEFICIENCY, FRACTURES): VITD: 36.34 ng/mL (ref 30.00–100.00)

## 2022-01-08 ENCOUNTER — Ambulatory Visit (HOSPITAL_COMMUNITY): Payer: Medicare Other | Attending: Cardiology

## 2022-01-08 ENCOUNTER — Other Ambulatory Visit: Payer: Self-pay | Admitting: Cardiology

## 2022-01-08 DIAGNOSIS — I10 Essential (primary) hypertension: Secondary | ICD-10-CM

## 2022-01-08 DIAGNOSIS — I5032 Chronic diastolic (congestive) heart failure: Secondary | ICD-10-CM

## 2022-01-08 DIAGNOSIS — Z9889 Other specified postprocedural states: Secondary | ICD-10-CM

## 2022-01-08 LAB — ECHOCARDIOGRAM COMPLETE
AR max vel: 1.07 cm2
AV Area VTI: 1.06 cm2
AV Area mean vel: 1.01 cm2
AV Mean grad: 12 mmHg
AV Peak grad: 21.2 mmHg
Ao pk vel: 2.3 m/s
Area-P 1/2: 1.51 cm2
MV VTI: 0.82 cm2
P 1/2 time: 491 msec
S' Lateral: 2.5 cm

## 2022-01-08 LAB — PARATHYROID HORMONE, INTACT (NO CA): PTH: 99 pg/mL — ABNORMAL HIGH (ref 16–77)

## 2022-01-08 LAB — VITAMIN B1: Vitamin B1 (Thiamine): <6 nmol/L — ABNORMAL LOW (ref 8–30)

## 2022-01-11 ENCOUNTER — Ambulatory Visit (INDEPENDENT_AMBULATORY_CARE_PROVIDER_SITE_OTHER): Payer: Medicare Other | Admitting: Family Medicine

## 2022-01-11 ENCOUNTER — Encounter: Payer: Self-pay | Admitting: Family Medicine

## 2022-01-11 VITALS — BP 142/72 | HR 69 | Temp 97.9°F | Ht 62.5 in | Wt 197.1 lb

## 2022-01-11 DIAGNOSIS — I484 Atypical atrial flutter: Secondary | ICD-10-CM

## 2022-01-11 DIAGNOSIS — M797 Fibromyalgia: Secondary | ICD-10-CM

## 2022-01-11 DIAGNOSIS — I5032 Chronic diastolic (congestive) heart failure: Secondary | ICD-10-CM

## 2022-01-11 DIAGNOSIS — E519 Thiamine deficiency, unspecified: Secondary | ICD-10-CM

## 2022-01-11 DIAGNOSIS — E538 Deficiency of other specified B group vitamins: Secondary | ICD-10-CM

## 2022-01-11 DIAGNOSIS — H409 Unspecified glaucoma: Secondary | ICD-10-CM

## 2022-01-11 DIAGNOSIS — N1832 Chronic kidney disease, stage 3b: Secondary | ICD-10-CM

## 2022-01-11 DIAGNOSIS — M85852 Other specified disorders of bone density and structure, left thigh: Secondary | ICD-10-CM

## 2022-01-11 DIAGNOSIS — I10 Essential (primary) hypertension: Secondary | ICD-10-CM

## 2022-01-11 DIAGNOSIS — M79605 Pain in left leg: Secondary | ICD-10-CM

## 2022-01-11 DIAGNOSIS — N2581 Secondary hyperparathyroidism of renal origin: Secondary | ICD-10-CM

## 2022-01-11 DIAGNOSIS — E7800 Pure hypercholesterolemia, unspecified: Secondary | ICD-10-CM

## 2022-01-11 DIAGNOSIS — M3501 Sicca syndrome with keratoconjunctivitis: Secondary | ICD-10-CM

## 2022-01-11 DIAGNOSIS — F331 Major depressive disorder, recurrent, moderate: Secondary | ICD-10-CM

## 2022-01-11 DIAGNOSIS — I739 Peripheral vascular disease, unspecified: Secondary | ICD-10-CM

## 2022-01-11 DIAGNOSIS — Z531 Procedure and treatment not carried out because of patient's decision for reasons of belief and group pressure: Secondary | ICD-10-CM

## 2022-01-11 DIAGNOSIS — Z0000 Encounter for general adult medical examination without abnormal findings: Secondary | ICD-10-CM | POA: Diagnosis not present

## 2022-01-11 DIAGNOSIS — Z7189 Other specified counseling: Secondary | ICD-10-CM

## 2022-01-11 DIAGNOSIS — Z9889 Other specified postprocedural states: Secondary | ICD-10-CM

## 2022-01-11 MED ORDER — VITAMIN B-12 1000 MCG PO TABS: 1000.0000 ug | ORAL_TABLET | ORAL | Status: AC

## 2022-01-11 MED ORDER — GABAPENTIN 300 MG PO CAPS: ORAL_CAPSULE | ORAL | 3 refills | Status: DC

## 2022-01-11 MED ORDER — EZETIMIBE 10 MG PO TABS: 10.0000 mg | ORAL_TABLET | Freq: Every day | ORAL | 3 refills | Status: DC

## 2022-01-11 MED ORDER — THIAMINE HCL 100 MG PO TABS: 100.0000 mg | ORAL_TABLET | Freq: Every day | ORAL | Status: DC

## 2022-01-11 MED ORDER — LOVASTATIN 10 MG PO TABS: 10.0000 mg | ORAL_TABLET | ORAL | 3 refills | Status: DC

## 2022-01-11 NOTE — Assessment & Plan Note (Signed)
Describes bilateral leg aches and cramping worse at night time.  Pain most consistent with MSK cause, not neuropathy or claudication. rec trial off statin for 2 wks, update with effect.

## 2022-01-11 NOTE — Assessment & Plan Note (Signed)
Sees psychiatrist.

## 2022-01-11 NOTE — Assessment & Plan Note (Signed)
Advanced directive discussion - received and scanned 2017. Son Caitlinn Klinker is HCPOA then [place]. Clear she does not want CPR, no intubation. Has DNR form at home. Jehovah's witness - no blood products.

## 2022-01-11 NOTE — Assessment & Plan Note (Signed)
Sees cardiology

## 2022-01-11 NOTE — Assessment & Plan Note (Signed)
Chronic, uncontrolled she attributes to recent increase in [employer].  Given ongoing leg pains, suggested 2 wk trial off lovastatin, update [place] with effect. Consider pravastatin vs PCSK9i. Continue zetia.  The 10-year ASCVD risk score (Arnett DK, et al., 2019) is: 14.1%   Values used to calculate the score:     Age: 71 years     Sex: Female     Is Non-Hispanic African American: Yes     Diabetic: No     Tobacco smoker: No     Systolic Blood Pressure: 292 mmHg     Is BP treated: Yes     HDL Cholesterol: 76 mg/dL     Total Cholesterol: 201 mg/dL

## 2022-01-11 NOTE — Assessment & Plan Note (Signed)
Continue aspirin, statin.  

## 2022-01-11 NOTE — Assessment & Plan Note (Addendum)
Chronic, adequate control on current regimen.

## 2022-01-11 NOTE — Patient Instructions (Addendum)
Drop B12 replacement to 1048mg once a week.  Start taking b1 (thiamine) '100mg'$  daily.  Call insurance about assistance to help make bathroom safe.  For leg pain - could consider trying off lovastatin for 1-2 weeks and update u[place] effect.  Good to see you today Return as needed or in 6 months for follow up visit.   Health Maintenance After Age 7228After age 69 you are at a higher risk for certain long-term diseases and infections as well as injuries from falls. Falls are a major cause of broken bones and head injuries in people who are older than age 69 Getting regular preventive care can help to keep you healthy and well. Preventive care includes getting regular testing and making lifestyle changes as recommended by your health care provider. Talk with your health care provider about: Which screenings and tests you should have. A screening is a test that checks for a disease when you have no symptoms. A diet and exercise plan that is right for you. What should I know about screenings and tests to prevent falls? Screening and testing are the best ways to find a health problem early. Early diagnosis and treatment give you the best chance of managing medical conditions that are common after age 69 Certain conditions and lifestyle choices may make you more likely to have a fall. Your health care provider may recommend: Regular vision checks. Poor vision and conditions such as cataracts can make you more likely to have a fall. If you wear glasses, make sure to get your prescription updated if your vision changes. Medicine review. Work with your health care provider to regularly review all of the medicines you are taking, including over-the-counter medicines. Ask your health care provider about any side effects that may make you more likely to have a fall. Tell your health care provider if any medicines that you take make you feel dizzy or sleepy. Strength and balance checks. Your health care provider  may recommend certain tests to check your strength and balance while standing, walking, or changing positions. Foot health exam. Foot pain and numbness, as well as not wearing proper footwear, can make you more likely to have a fall. Screenings, including: Osteoporosis screening. Osteoporosis is a condition that causes the bones to get weaker and break more easily. Blood pressure screening. Blood pressure changes and medicines to control blood pressure can make you feel dizzy. Depression screening. You may be more likely to have a fall if you have a fear of falling, feel depressed, or feel unable to do activities that you used to do. Alcohol use screening. Using too much alcohol can affect your balance and may make you more likely to have a fall. Follow these instructions at home: Lifestyle Do not drink alcohol if: Your health care provider tells you not to drink. If you drink alcohol: Limit how much you have to: 0-1 drink a day for women. 0-2 drinks a day for men. Know how much alcohol is in your drink. In the [place] one drink equals one 12 oz bottle of beer (355 mL), one 5 oz glass of wine (148 mL), or one 1 oz glass of hard liquor (44 mL). Do not use any products that contain nicotine or tobacco. These products include cigarettes, chewing tobacco, and vaping devices, such as e-cigarettes. If you need help quitting, ask your health care provider. Activity  Follow a regular exercise program to stay fit. This will help you maintain your balance. Ask your health care  provider what types of exercise are appropriate for you. If you need a cane or walker, use it as recommended by your health care provider. Wear supportive shoes that have nonskid soles. Safety  Remove any tripping hazards, such as rugs, cords, and clutter. Install safety equipment such as grab bars in bathrooms and safety rails on stairs. Keep rooms and walkways well-lit. General instructions Talk with your health care  provider about your risks for falling. Tell your health care provider if: You fall. Be sure to tell your health care provider about all falls, even ones that seem minor. You feel dizzy, tiredness (fatigue), or off-balance. Take over-the-counter and prescription medicines only as told by your health care provider. These include supplements. Eat a healthy diet and maintain a healthy weight. A healthy diet includes low-fat dairy products, low-fat (lean) meats, and fiber from whole grains, beans, and lots of fruits and vegetables. Stay current with your vaccines. Schedule regular health, dental, and eye exams. Summary Having a healthy lifestyle and getting preventive care can help to protect your health and wellness after age 58. Screening and testing are the best way to find a health problem early and help you avoid having a fall. Early diagnosis and treatment give you the best chance for managing medical conditions that are more common for people who are older than age 58. Falls are a major cause of broken bones and head injuries in people who are older than age 45. Take precautions to prevent a fall at home. Work with your health care provider to learn what changes you can make to improve your health and wellness and to prevent falls. This information is not intended to replace advice given to you by your health care provider. Make sure you discuss any questions you have with your health care provider. Document Revised: 07/24/2020 Document Reviewed: 07/24/2020 Elsevier Patient Education  [place].

## 2022-01-11 NOTE — Assessment & Plan Note (Signed)
Sees rheumatology.

## 2022-01-11 NOTE — Assessment & Plan Note (Signed)

## 2022-01-11 NOTE — Progress Notes (Addendum)
Patient ID: Alexandria Leach, female    DOB: [birth year], 71 y.o.   MRN: [id number]  This visit was conducted in person.  BP (!) 142/72   Pulse 69   Temp 97.9 F (36.6 C) (Temporal)   Ht 5' 2.5" (1.588 m)   Wt 197 lb 2 oz (89.4 kg)   SpO2 96%   BMI 35.48 kg/m    CC: CPE/AMW Subjective:   HPI: Alexandria Leach is a 71 y.o. female presenting on 01/11/2022 for Medicare Wellness   Did not see health advisor this year.   Hearing Screening   '500Hz'$  '1000Hz'$  '2000Hz'$  '4000Hz'$   Right ear 40 40 40 40  Left ear 20 40 40 25  Vision Screening - Comments:: Last eye exam, 07/2021.  [facility] Office Visit from 01/11/2022 in [facility] [place]  PHQ-2 Total Score 1          01/11/2022    9:19 AM 09/21/2021    4:21 PM 02/12/2021    2:50 PM 01/12/2021    2:12 PM 12/28/2019    8:56 AM  Fall Risk   Falls in the past year? 1 0 0 0 0  Number falls in past yr: 0 0 0    Injury with Fall? 1 0 0    Comment R writst fx.  Bilateral knee injuries.      Risk for fall due to :   No Fall Risks    Follow up   Falls prevention discussed     Atypical atrial flutter - sees EP Alexandria Leach) and cardiology [facility]) on renally dosed dofetilide (Tikosyn) as well as cardizem, metoprolol and eliquis.  Known chronic HFpEF with mitral stenosis/regurg s/p repair - rec SBE ppx.   Sjogren's with keratoconjunctivitis sicca (ANA+, Ro+) and fibromyalgia followed by Dr Alexandria Leach, last seen 09/2021. She does chew a lot of sugarless gum.  Notes aching of entire legs from knees to ankles worse at night time, can be unbearable and last 30 min. No claudication symptoms. No burning pain or numbness or paresthesias. No muscle weakness. No RLS type symptoms. Also notes nocturnal cramping to calves , no benefit with CoQ10.   Preventative: COLONOSCOPY 07/2014; WNL Alexandria Leach)  COLONOSCOPY 08/2019 - 3 TAs, rpt 3 yrs (Alexandria Leach)  [facility]  Well woman exam - s/p  hysterectomy, ovaries remain. No pelvic pain, vaginal bleeding. Pelvic exam normal 2017.  DEXA T -1.1 hip, -0.2 spine 08/2015  DEXA 07/2021: T score -1.1 left femoral neck-stable osteopenia, not at increased risk of bone fracture. Discussed calcium intake, vit D supplementation, and regular weight bearing exercise.  Lung cancer screening - not eligible  Flu shot - declines  COVID vaccine - Pfizer 04/2019, 05/2019, booster 12/2019, 01/2021, latest 12/2021  Tetanus shot - will check with insurance  Pneumovax 2014, prevnar-13 2019, pneumovax 12/2018 Shingrix - discussed, declines  Advanced directive discussion - received and scanned 2017. Son Alexandria Leach is HCPOA then [place]. Clear she does not want CPR, no intubation. Has DNR form at home. Jehovah's witness - no blood products.  Seat belt use discussed Sunscreen use and skin screen discussed  Non smoker  Alcohol - none  Dentist yearly  Eye exam yearly  Bowel - occasional constipation despite daily colace, miralax PRN Bladder - some overflow incontinence, unnoticed    Lives with son, 1 dog Occupation: unemployed, on disability for fibromyalgia since 2008. Edu: HS Religion: Jehova's witness Activity: walks some  Diet: good water,  fruits/vegetables some  No caffeine use     Relevant past medical, surgical, family and social history reviewed and updated as indicated. Interim medical history since our last visit reviewed. Allergies and medications reviewed and updated. Outpatient Medications Prior to Visit  Medication Sig Dispense Refill   ALPRAZolam (XANAX) 0.5 MG tablet Take 1 tablet (0.5 mg total) by mouth 3 (three) times daily as needed for anxiety. 270 tablet 2   antiseptic oral rinse (BIOTENE) LIQD 15 mLs by Mouth Rinse route 2 (two) times daily as needed for dry mouth.     cholecalciferol (VITAMIN D) 1000 UNITS tablet Take 1,000 Units by mouth daily.     co-enzyme Q-10 30 MG capsule Take 30 mg by mouth daily.     diltiazem (CARDIZEM  CD) 360 MG 24 hr capsule TAKE 1 CAPSULE BY MOUTH  DAILY 100 capsule 2   docusate sodium (COLACE) 100 MG capsule Take 100 mg by mouth daily.     dofetilide (TIKOSYN) 250 MCG capsule TAKE 1 CAPSULE(250 MCG) BY MOUTH TWICE DAILY 180 capsule 1   ELIQUIS 5 MG TABS tablet TAKE 1 TABLET BY MOUTH TWICE  DAILY 200 tablet 1   furosemide (LASIX) 20 MG tablet Take 3 tablets (60 mg total) by mouth daily. 270 tablet 3   loratadine (CLARITIN) 10 MG tablet Take 1 tablet (10 mg total) by mouth daily.  11   losartan (COZAAR) 50 MG tablet TAKE 1 TABLET BY MOUTH DAILY 100 tablet 3   metoprolol tartrate (LOPRESSOR) 50 MG tablet TAKE 1 AND 1/2 TABLETS BY  MOUTH TWICE DAILY 270 tablet 3   pantoprazole (PROTONIX) 40 MG tablet Take 40 mg by mouth daily.     Polyethyl Glycol-Propyl Glycol (SYSTANE) 0.4-0.3 % GEL ophthalmic gel Place 1 drop into both eyes 2 (two) times daily.      RESTASIS 0.05 % ophthalmic emulsion INSTILL ONE DROP IN BOTH  EYES TWO TIMES DAILY 180 each 0   temazepam (RESTORIL) 30 MG capsule Take 1 capsule (30 mg total) by mouth at bedtime as needed for sleep. 30 capsule 2   traMADol (ULTRAM) 50 MG tablet TAKE 1 TABLET BY MOUTH DAILY AS  NEEDED 30 tablet 0   ezetimibe (ZETIA) 10 MG tablet TAKE 1 TABLET BY MOUTH AT  BEDTIME 90 tablet 0   gabapentin (NEURONTIN) 300 MG capsule TAKE 2 CAPSULES BY MOUTH IN THE MORNING 1 CAPSULE BY  MOUTH IN THE AFTERNOON AND  2 CAPSULES BY MOUTH AT  BEDTIME 450 capsule 3   lovastatin (MEVACOR) 10 MG tablet TAKE 1 TABLET BY MOUTH EVERY  MONDAY, WEDNESDAY, AND FRIDAY 39 tablet 0   vitamin B-12 (CYANOCOBALAMIN) 1000 MCG tablet Take 1 tablet (1,000 mcg total) by mouth every Monday, Wednesday, and Friday.     potassium chloride (KLOR-CON) 10 MEQ tablet Take 1 tablet (10 mEq total) by mouth daily. 90 tablet 3   No facility-administered medications prior to visit.     Per HPI unless specifically indicated in ROS section below Review of Systems  Constitutional:  Negative for  activity change, appetite change, chills, fatigue, fever and unexpected weight change.  HENT:  Negative for hearing loss.   Eyes:  Negative for visual disturbance.  Respiratory:  Negative for cough, chest tightness, shortness of breath and wheezing.   Cardiovascular:  Negative for chest pain, palpitations and leg swelling.  Gastrointestinal:  Negative for abdominal distention, abdominal pain, blood in stool, constipation, diarrhea, nausea and vomiting.  Genitourinary:  Negative for difficulty urinating and  hematuria.  Musculoskeletal:  Positive for arthralgias. Negative for myalgias and neck pain.  Skin:  Negative for rash.  Neurological:  Negative for dizziness, seizures, syncope and headaches.  Hematological:  Negative for adenopathy. Bruises/bleeds easily.  Psychiatric/Behavioral:  Negative for dysphoric mood. The patient is not nervous/anxious.     Objective:  BP (!) 142/72   Pulse 69   Temp 97.9 F (36.6 C) (Temporal)   Ht 5' 2.5" (1.588 m)   Wt 197 lb 2 oz (89.4 kg)   SpO2 96%   BMI 35.48 kg/m   Wt Readings from Last 3 Encounters:  01/11/22 197 lb 2 oz (89.4 kg)  12/25/21 199 lb (90.3 kg)  10/11/21 193 lb 6.4 oz (87.7 kg)      Physical Exam Vitals and nursing note reviewed.  Constitutional:      Appearance: Normal appearance. She is not ill-appearing.  HENT:     Head: Normocephalic and atraumatic.     Right Ear: Tympanic membrane, ear canal and external ear normal. There is no impacted cerumen.     Left Ear: Tympanic membrane, ear canal and external ear normal. There is no impacted cerumen.     Mouth/Throat:     Comments: Wearing face mask Eyes:     General:        Right eye: No discharge.        Left eye: No discharge.     Extraocular Movements: Extraocular movements intact.     Conjunctiva/sclera: Conjunctivae normal.     Pupils: Pupils are equal, round, and reactive to light.  Neck:     Thyroid: No thyroid mass or thyromegaly.     Vascular: No carotid bruit.   Cardiovascular:     Rate and Rhythm: Normal rate and regular rhythm.     Pulses: Normal pulses.     Heart sounds: Normal heart sounds. No murmur heard. Pulmonary:     Effort: Pulmonary effort is normal. No respiratory distress.     Breath sounds: Normal breath sounds. No wheezing, rhonchi or rales.  Abdominal:     General: Bowel sounds are normal. There is no distension.     Palpations: Abdomen is soft. There is no mass.     Tenderness: There is no abdominal tenderness. There is no guarding or rebound.     Hernia: No hernia is present.  Musculoskeletal:     Cervical back: Normal range of motion and neck supple. No rigidity.     Right lower leg: No edema.     Left lower leg: No edema.  Lymphadenopathy:     Cervical: No cervical adenopathy.  Skin:    General: Skin is warm and dry.     Findings: No rash.  Neurological:     General: No focal deficit present.     Mental Status: She is alert. Mental status is at baseline.     Comments:  Recall 3/3 Calculation 5/5 DLROW  Psychiatric:        Mood and Affect: Mood normal.        Behavior: Behavior normal.       Results for orders placed or performed in visit on 01/08/22  ECHOCARDIOGRAM COMPLETE  Result Value Ref Range   Area-P 1/2 1.51 cm2   S' Lateral 2.50 cm   AV Area mean vel 1.01 cm2   AR max vel 1.07 cm2   AV Area VTI 1.06 cm2   MV VTI 0.82 cm2   P 1/2 time 491 msec   Ao pk  vel 2.30 m/s   AV Mean grad 12.0 mmHg   AV Peak grad 21.2 mmHg    Assessment & Plan:   Problem List Items Addressed This Visit     Refusal of blood transfusions as patient is Jehovah's Witness (Chronic)   Health maintenance examination (Chronic)    Preventative protocols reviewed and updated unless pt declined. Discussed healthy diet and lifestyle.       Advanced care planning/counseling discussion (Chronic)    Advanced directive discussion - received and scanned 2017. Son Tersea Aulds is HCPOA then [place]. Clear she does not want CPR, no  intubation. Has DNR form at home. Jehovah's witness - no blood products.       Medicare annual wellness visit, subsequent - Primary (Chronic)    I have personally reviewed the Medicare Annual Wellness questionnaire and have noted 1. The patient's medical and social history 2. Their use of alcohol, tobacco or illicit drugs 3. Their current medications and supplements 4. The patient's functional ability including ADL's, fall risks, home safety risks and hearing or visual impairment. Cognitive function has been assessed and addressed as indicated.  5. Diet and physical activity 6. Evidence for depression or mood disorders The patients weight, height, BMI have been recorded in the chart. I have made referrals, counseling and provided education to the patient based on review of the above and I have provided the pt with a written personalized care plan for preventive services. Provider list updated.. See scanned questionairre as needed for further documentation. Reviewed preventative protocols and updated unless pt declined.       HYPERCHOLESTEROLEMIA    Chronic, uncontrolled she attributes to recent increase in [employer].  Given ongoing leg pains, suggested 2 wk trial off lovastatin, update [place] with effect. Consider pravastatin vs [id number]. Continue zetia.  The 10-year ASCVD risk score (Arnett DK, et al., 2019) is: 14.1%   Values used to calculate the score:     Age: 32 years     Sex: Female     Is Non-Hispanic African American: Yes     Diabetic: No     Tobacco smoker: No     Systolic Blood Pressure: 390 mmHg     Is BP treated: Yes     HDL Cholesterol: 76 mg/dL     Total Cholesterol: 201 mg/dL       Relevant Medications   ezetimibe (ZETIA) 10 MG tablet   lovastatin (MEVACOR) 10 MG tablet   MDD (major depressive disorder), recurrent episode [facility])    Sees psychiatrist.       Essential hypertension    Chronic, adequate control on current regimen.       Relevant Medications    ezetimibe (ZETIA) 10 MG tablet   lovastatin (MEVACOR) 10 MG tablet   History of mitral valve repair   (HFpEF) heart failure with preserved ejection fraction (HCC)    Sees cardiology       Relevant Medications   ezetimibe (ZETIA) 10 MG tablet   lovastatin (MEVACOR) 10 MG tablet   Sjogren's syndrome [facility])    Sees rheumatology.      CKD (chronic kidney disease) stage 3, GFR 30-59 ml/min (HCC)    Continue to monitor CKD.       Glaucoma   Fibromyalgia   Relevant Medications   gabapentin (NEURONTIN) 300 MG capsule   Osteopenia    Reviewed calcium, vit D intake, weight bearing exercise routine.      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HCC)    Continue aspirin,  statin.       Relevant Medications   ezetimibe (ZETIA) 10 MG tablet   lovastatin (MEVACOR) 10 MG tablet   Secondary hyperparathyroidism of renal origin (HCC)   Atrial flutter (HCC)   Relevant Medications   ezetimibe (ZETIA) 10 MG tablet   lovastatin (MEVACOR) 10 MG tablet   Low serum vitamin B12    Drop b12 replacement to weekly.      Vitamin B1 deficiency    Start  oral B1 '100mg'$  daily.      Leg pain, bilateral    Describes bilateral leg aches and cramping worse at night time.  Pain most consistent with MSK cause, not neuropathy or claudication. rec trial off statin for 2 wks, update with effect.        Meds ordered this encounter  Medications   cyanocobalamin (VITAMIN B12) 1000 MCG tablet    Sig: Take 1 tablet (1,000 mcg total) by mouth once a week.   thiamine (VITAMIN B1) 100 MG tablet    Sig: Take 1 tablet (100 mg total) by mouth daily.   ezetimibe (ZETIA) 10 MG tablet    Sig: Take 1 tablet (10 mg total) by mouth at bedtime.    Dispense:  90 tablet    Refill:  3    Please send a replace/new response with 100-Day Supply if appropriate to maximize member benefit. Requesting 1 year supply.   gabapentin (NEURONTIN) 300 MG capsule    Sig: TAKE 2 CAPSULES BY MOUTH IN THE MORNING 1 CAPSULE BY  MOUTH IN THE  AFTERNOON AND  2 CAPSULES BY MOUTH AT  BEDTIME    Dispense:  450 capsule    Refill:  3   lovastatin (MEVACOR) 10 MG tablet    Sig: Take 1 tablet (10 mg total) by mouth every Monday, Wednesday, and Friday. At night    Dispense:  40 tablet    Refill:  3   No orders of the defined types were placed in this encounter.   Patient instructions: Drop B12 replacement to 1045mg once a week.  Start taking b1 (thiamine) '100mg'$  daily.  Call insurance about assistance to help make bathroom safe.  For leg pain - could consider trying off lovastatin for 1-2 weeks and update [place] effect.  Good to see you today Return as needed or in 6 months for follow up visit.   Follow up plan: Return in about 6 months (around 07/13/2022) for follow up visit.  JRia Bush MD

## 2022-01-11 NOTE — Assessment & Plan Note (Signed)
Continue to monitor CKD.

## 2022-01-11 NOTE — Assessment & Plan Note (Addendum)
Reviewed calcium, vit D intake, weight bearing exercise routine.

## 2022-01-11 NOTE — Assessment & Plan Note (Signed)
Drop b12 replacement to weekly.

## 2022-01-11 NOTE — Assessment & Plan Note (Addendum)
Start  oral B1 '100mg'$  daily.

## 2022-01-11 NOTE — Assessment & Plan Note (Signed)
Preventative protocols reviewed and updated unless pt declined. Discussed healthy diet and lifestyle.  

## 2022-02-01 ENCOUNTER — Other Ambulatory Visit (HOSPITAL_COMMUNITY): Payer: Self-pay | Admitting: *Deleted

## 2022-02-01 MED ORDER — POTASSIUM CHLORIDE ER 10 MEQ PO TBCR: 10.0000 meq | EXTENDED_RELEASE_TABLET | Freq: Every day | ORAL | 3 refills | Status: DC

## 2022-02-09 ENCOUNTER — Encounter: Payer: Self-pay | Admitting: Emergency Medicine

## 2022-02-09 ENCOUNTER — Ambulatory Visit
Admission: EM | Admit: 2022-02-09 | Discharge: 2022-02-09 | Disposition: A | Discharge status: Home / Self Care | Payer: Medicare Other | Attending: Nurse Practitioner | Admitting: Nurse Practitioner

## 2022-02-09 DIAGNOSIS — I889 Nonspecific lymphadenitis, unspecified: Secondary | ICD-10-CM | POA: Diagnosis not present

## 2022-02-09 DIAGNOSIS — R591 Generalized enlarged lymph nodes: Secondary | ICD-10-CM

## 2022-02-09 MED ORDER — CEPHALEXIN 500 MG PO CAPS: 500.0000 mg | ORAL_CAPSULE | Freq: Three times a day (TID) | ORAL | 0 refills | Status: AC

## 2022-02-09 NOTE — ED Provider Notes (Signed)
[facility]    CSN: [id number] Arrival date & time: 02/09/22  1013      History   Chief Complaint No chief complaint on file.   HPI Alexandria Leach is a 71 y.o. female.   The history is provided by the patient.   Patient presents with complaints of pain and swelling behind the left ear that started 2 days ago.  Patient states since her symptoms started, the pain has moved to the right side of her jaw.  She states that the pain has become increasingly worse with eating and opening her mouth.  She denies fever, chills, injury or trauma to her mouth, dental pain, gum swelling, nausea, vomiting, or diarrhea.  She states that she did take over-the-counter medicine to help with pain.  Denies any recent illness.  Past Medical History:  Diagnosis Date   Ankle fracture, left 02/19/2015   from a fall   Anxiety    Cataract 2014   corrected with surgery   CHF (congestive heart failure) (HCC)    CKD (chronic kidney disease) stage 3, GFR 30-59 ml/min [facility])    saw nephrologist Dr Harden Mo   DDD (degenerative disc disease), cervical    Depression    Fibromyalgia    Glaucoma    s/p surgery, sees ophtho Q6 mo   History of DVT (deep vein thrombosis) several times latest 2012   receives coumadin while hospitalized   History of kidney stones 2010   History of pulmonary embolism 2001, 2006   completed coumadin courses   History of rheumatic fever x3   HLD (hyperlipidemia)    HTN (hypertension)    Insomnia    Low serum vitamin B12 01/13/2021   Lung nodule 09/22/2012   RLL - 9m, stable since 2014. Thought benign.    Osteoarthritis    shoulders and knees, not RA per Dr DEstanislado Pandy positive ANA, positive Ro   Osteopenia 09/18/2015   DEXA T -1.1 hip, -0.2 spine 08/2015    Personal history of urinary calculi latest 2014   Pneumonia 12/02/2011   PONV (postoperative nausea and vomiting)    Refusal of blood transfusions as patient is Jehovah's Witness    Rheumatic heart disease  1980   s/p mitral valve repair 1980   [place]    Trimalleolar fracture of left ankle 02/23/2015    Patient Active Problem List   Diagnosis Date Noted   Leg pain, bilateral 01/11/2022   Rhinorrhea 10/03/2021   Pruritus 10/03/2021   COVID-19 09/21/2021   Pedal edema 08/22/2021   Chronic pain of right knee 07/11/2021   Chronic GERD 01/30/2021   History of adenomatous polyp of colon 01/30/2021   Left sided abdominal pain 01/30/2021   Vitamin B1 deficiency 01/18/2021   Low serum vitamin B12 01/13/2021   [place]able [place] 10/23/2020   Prediabetes 07/11/2020   Snoring 06/23/2020   Atrial flutter ([place] 06/23/2020   Dysphagia 05/15/2020   Secondary hyperparathyroidism of renal origin ([place] 12/28/2019   Constipation 12/28/2019   Sinus tachycardia 05/27/2018   Transient alteration of awareness 04/04/2018   Abnormal serum thyroid stimulating hormone (TSH) level 01/30/2018   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   Thyroid nodule 09/16/2017   Neck pain on right side 05/14/2017   Sialoadenitis of submandibular gland 10/29/2016   High risk medication use 06/05/2016   Peripheral neuropathy 01/30/2016   DNR no code (do not resuscitate) 12/08/2015   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   TIA (transient ischemic attack)  10/12/2015   Mild mitral regurgitation 10/12/2015   Osteopenia 09/18/2015   Right arm pain 08/29/2015   History of fall    Right sided sciatica 02/21/2015   Imbalance 01/12/2015   Transaminitis 12/24/2014   DDD (degenerative disc disease), cervical    Health maintenance examination 10/18/2014   Advanced care planning/counseling discussion 10/18/2014   Medicare annual wellness visit, subsequent 10/18/2014   Refusal of blood transfusions as patient is Jehovah's Witness    Sjogren's syndrome ([place])    CKD (chronic kidney disease) stage 3, GFR 30-59 ml/min (HCC)    Glaucoma    Fibromyalgia    Osteoarthritis     History of pulmonary embolism    Lung nodule 09/22/2012   (HFpEF) heart failure with preserved ejection fraction ([place]) 12/04/2011   Aortic stenosis 04/22/2011   Palpitations 08/10/2010   HOT FLASHES 06/28/2008   GAD (generalized anxiety disorder) 03/28/2006   History of mitral valve repair 03/28/2006   History of total abdominal hysterectomy 03/28/2006   HYPERCHOLESTEROLEMIA 12/31/2005   MDD (major depressive disorder), recurrent episode ([place]) 12/31/2005   RHEUMATIC HEART DISEASE 12/31/2005   Essential hypertension 12/31/2005   Chronic insomnia 12/31/2005    Past Surgical History:  Procedure Laterality Date   BREAST BIOPSY Right 2006   benign   CARDIOVERSION N/A 07/18/2020   Procedure: CARDIOVERSION;  Surgeon: Elouise Munroe, MD;  Location[facility]COPY;  Service: Cardiovascular;  Laterality: N/A;   CHOLECYSTECTOMY  11/27/2011   Procedure: LAPAROSCOPIC CHOLECYSTECTOMY WITH INTRAOPERATIVE CHOLANGIOGRAM;  Surgeon: Adin Hector, MD;  Location: [place];  Service: General;  Laterality: N/A;  laparoscopic cholecystectomy with choleangiogram umbilical hernia repair   COLONOSCOPY  07/2014   WNL [facility] Plenty)   COLONOSCOPY  08/2019   3 TAs, rpt 3 yrs ([place])   dexa  08/2012   normal per patient - no records available   ESOPHAGOGASTRODUODENOSCOPY  08/2019   reactive gastropathy, neg H pylori ([place])   EYE SURGERY Bilateral 2014   cataract removal   MITRAL VALVE REPAIR  1980   open heart   ORIF ANKLE FRACTURE Left 02/26/2015   Procedure: OPEN REDUCTION INTERNAL FIXATION (ORIF) LEFT TRIMALLEOLAR ANKLE FRACTURE;  Surgeon: [name], MD;  Location: [place];  Service: Orthopedics;  Laterality: Left;   TUBAL LIGATION  7893   UMBILICAL HERNIA REPAIR  11/27/2011   Procedure: HERNIA REPAIR UMBILICAL ADULT;  Surgeon: Adin Hector, MD;  Location: [place];  Service: General;  Laterality: N/A;   [place]   for fibroids -- partial, ovaries remain    OB History   No  obstetric history on file.      Home Medications    Prior to Admission medications   Medication Sig Start Date End Date Taking? Authorizing Provider  cephALEXin (KEFLEX) 500 MG capsule Take 1 capsule (500 mg total) by mouth 3 (three) times daily for 7 days. 02/09/22 02/16/22 Yes Annaya Bangert-Warren, Alda Lea, NP  ALPRAZolam Duanne Moron) 0.5 MG tablet Take 1 tablet (0.5 mg total) by mouth 3 (three) times daily as needed for anxiety. 11/21/21 11/21/22  Cloria Spring, MD  antiseptic oral rinse (BIOTENE) LIQD 15 mLs by Mouth Rinse route 2 (two) times daily as needed for dry mouth.    [provider]  cholecalciferol (VITAMIN D) 1000 UNITS tablet Take 1,000 Units by mouth daily.    [provider]  co-enzyme Q-10 30 MG capsule Take 30 mg by mouth daily.    [provider]  cyanocobalamin (VITAMIN B12) 1000  MCG tablet Take 1 tablet (1,000 mcg total) by mouth once a week. 01/11/22   Ria Bush, MD  diltiazem [facility] CD) 360 MG 24 hr capsule TAKE 1 CAPSULE BY MOUTH  DAILY 07/04/21   Lelon Perla, MD  docusate sodium (COLACE) 100 MG capsule Take 100 mg by mouth daily.    [provider]  dofetilide (TIKOSYN) 250 MCG capsule TAKE 1 CAPSULE(250 MCG) BY MOUTH TWICE DAILY 08/29/21   Fenton, Clint R, PA  ELIQUIS 5 MG TABS tablet TAKE 1 TABLET BY MOUTH TWICE  DAILY 11/20/21   Sherran Needs, NP  ezetimibe (ZETIA) 10 MG tablet Take 1 tablet (10 mg total) by mouth at bedtime. 01/11/22   Ria Bush, MD  furosemide (LASIX) 20 MG tablet Take 3 tablets (60 mg total) by mouth daily. 08/06/21   Lelon Perla, MD  gabapentin (NEURONTIN) 300 MG capsule TAKE 2 CAPSULES BY MOUTH IN THE MORNING 1 CAPSULE BY  MOUTH IN THE AFTERNOON AND  2 CAPSULES BY MOUTH AT  BEDTIME 01/11/22   Ria Bush, MD  loratadine (CLARITIN) 10 MG tablet Take 1 tablet (10 mg total) by mouth daily. 10/03/21   Ria Bush, MD  losartan (COZAAR) 50 MG tablet TAKE 1 TABLET BY MOUTH DAILY  01/09/22   Lelon Perla, MD  lovastatin (MEVACOR) 10 MG tablet Take 1 tablet (10 mg total) by mouth every Monday, Wednesday, and Friday. At night 01/11/22   Ria Bush, MD  metoprolol tartrate (LOPRESSOR) 50 MG tablet TAKE 1 AND 1/2 TABLETS BY  MOUTH TWICE DAILY 06/25/21   Lelon Perla, MD  pantoprazole (PROTONIX) 40 MG tablet Take 40 mg by mouth daily. 02/16/20   [provider]  Polyethyl Glycol-Propyl Glycol (SYSTANE) 0.4-0.3 % GEL ophthalmic gel Place 1 drop into both eyes 2 (two) times daily.     [provider]  potassium chloride (KLOR-CON) 10 MEQ tablet Take 1 tablet (10 mEq total) by mouth daily. 02/01/22 05/02/22  Fenton, Clint R, PA  RESTASIS 0.05 % ophthalmic emulsion INSTILL ONE DROP IN BOTH  EYES TWO TIMES DAILY 12/02/16   Ria Bush, MD  temazepam (RESTORIL) 30 MG capsule Take 1 capsule (30 mg total) by mouth at bedtime as needed for sleep. 11/21/21   Cloria Spring, MD  thiamine (VITAMIN B1) 100 MG tablet Take 1 tablet (100 mg total) by mouth daily. 01/11/22   Ria Bush, MD  traMADol Veatrice Bourbon) 50 MG tablet TAKE 1 TABLET BY MOUTH DAILY AS  NEEDED 08/14/21   Ofilia Neas, PA-C    Family History Family History  Problem Relation Age of Onset   Cancer Mother        lung (nonsmoker)   CAD Mother        MI in her 63s   ALS Mother    Kidney disease Father    Alcohol abuse Father    Diabetes Father    Lupus Sister        and niece   Diabetes Sister    Stroke Sister    Depression Sister    Lupus Sister    Blindness Sister    Cancer Maternal Uncle        bone   Stroke Maternal Grandmother    Cancer Brother        bone   Diabetes Brother    Heart attack Brother    Healthy Son    Healthy Son    Healthy Son    Healthy Son  Kidney failure Other        on HD   Breast cancer Neg Hx     Social History Social History   Tobacco Use   Smoking status: Never    Passive exposure: Past   Smokeless tobacco: Never   Tobacco  comments:    Never smoke 08/29/21  Vaping Use   Vaping Use: Never used  Substance Use Topics   Alcohol use: No    Alcohol/week: 0.0 standard drinks of alcohol   Drug use: No     Allergies   Cymbalta [duloxetine hcl], Statins, and Sulfa antibiotics   Review of Systems Review of Systems Per HPI  Physical Exam Triage Vital Signs ED Triage Vitals  Enc Vitals Group     BP 02/09/22 1135 139/77     Pulse Rate 02/09/22 1135 67     Resp 02/09/22 1135 18     Temp 02/09/22 1135 98.3 F (36.8 C)     Temp Source 02/09/22 1135 Oral     SpO2 02/09/22 1135 93 %     Weight --      Height --      Head Circumference --      Peak Flow --      Pain Score 02/09/22 1136 10     Pain Loc --      Pain Edu? --      Excl. in [place]? --    No data found.  Updated Vital Signs BP 139/77 (BP Location: Right Arm)   Pulse 67   Temp 98.3 F (36.8 C) (Oral)   Resp 18   SpO2 93%   Visual Acuity Right Eye Distance:   Left Eye Distance:   Bilateral Distance:    Right Eye Near:   Left Eye Near:    Bilateral Near:     Physical Exam Vitals and nursing note reviewed.  Constitutional:      General: She is not in acute distress.    Appearance: Normal appearance.  HENT:     Head: Normocephalic.     Right Ear: Tympanic membrane, ear canal and external ear normal.     Left Ear: Tympanic membrane, ear canal and external ear normal.     Nose: Nose normal.     Mouth/Throat:     Mouth: Mucous membranes are moist.     Pharynx: No oropharyngeal exudate or posterior oropharyngeal erythema.  Eyes:     Extraocular Movements: Extraocular movements intact.     Conjunctiva/sclera: Conjunctivae normal.     Pupils: Pupils are equal, round, and reactive to light.  Cardiovascular:     Rate and Rhythm: Normal rate and regular rhythm.     Pulses: Normal pulses.     Heart sounds: Normal heart sounds.  Pulmonary:     Effort: Pulmonary effort is normal.     Breath sounds: Normal breath sounds.  Abdominal:      General: Bowel sounds are normal.     Palpations: Abdomen is soft.  Musculoskeletal:     Cervical back: Normal range of motion.  Lymphadenopathy:     Head:     Right side of head: Posterior auricular adenopathy present.     Cervical: No cervical adenopathy.  Skin:    General: Skin is warm and dry.  Neurological:     General: No focal deficit present.     Mental Status: She is alert and oriented to person, place, and time.  Psychiatric:        Mood and  Affect: Mood normal.        Behavior: Behavior normal.        Thought Content: Thought content normal.        Judgment: Judgment normal.      UC Treatments / Results  Labs (all labs ordered are listed, but only abnormal results are displayed) Labs Reviewed - No data to display  EKG   Radiology No results found.  Procedures Procedures (including critical care time)  Medications Ordered in UC Medications - No data to display  Initial Impression / Assessment and Plan / UC Course  I have reviewed the triage vital signs and the nursing notes.  Pertinent labs & imaging results that were available during my care of the patient were reviewed by me and considered in my medical decision making (see chart for details).  Patient presents for complaints of swelling behind the right ear that is extending into the right jaw.  Symptoms have worsened over the past 2 days.  Patient's vital signs are stable, she is well-appearing, she is in no acute distress.  On exam, patient has swelling of the right postauricular lymph node.  Right face is tender to palpation.  Symptoms are consistent with lymphadenopathy/adenitis.  Will start patient on Keflex 500 mg 3 times daily.  Supportive care recommendations were provided to the patient to include the use of heat to help with pain or discomfort, and over-the-counter analgesics such as Tylenol.  Discussed with patient the importance of continuing to monitor the area for increasing size and pain.   If symptoms worsen, patient was advised to follow-up with her [facility] care physician as soon as possible.  Patient verbalizes understanding.  All questions were answered.  Patient is stable for discharge. Final Clinical Impressions(s) / UC Diagnoses   Final diagnoses:  Lymphadenopathy  Adenitis     Discharge Instructions      Take medication as prescribed. Apply warm compresses to the right face/ear as needed for pain or discomfort. May take OTC Tylenol as needed for pain or discomfort. Continue to monitor the area for increase in size or pain. If symptoms continue to worsen, please follow-up with your primary care physician for further evaluation.  Follow-up as needed.      ED Prescriptions     Medication Sig Dispense Auth. Provider   cephALEXin (KEFLEX) 500 MG capsule Take 1 capsule (500 mg total) by mouth 3 (three) times daily for 7 days. 21 capsule Hensley Aziz-Warren, Alda Lea, NP      PDMP not reviewed this encounter.   Tish Men, NP 02/09/22 1218

## 2022-02-09 NOTE — ED Triage Notes (Signed)
States she has a lump behind right ear since Tuesday.  States area became larger over time.  States area very tender to touch and hurts when eating and drinking

## 2022-02-09 NOTE — Discharge Instructions (Signed)
Take medication as prescribed. Apply warm compresses to the right face/ear as needed for pain or discomfort. May take OTC Tylenol as needed for pain or discomfort. Continue to monitor the area for increase in size or pain. If symptoms continue to worsen, please follow-up with your primary care physician for further evaluation.  Follow-up as needed.

## 2022-02-13 ENCOUNTER — Telehealth: Payer: Self-pay

## 2022-02-13 NOTE — Telephone Encounter (Signed)
Unsuccessful attempt to reach patient on preferred number listed in notes for scheduled AWV. Left message on voicemail okay to reschedule. 

## 2022-02-18 ENCOUNTER — Encounter (HOSPITAL_COMMUNITY): Payer: Self-pay | Admitting: Psychiatry

## 2022-02-18 ENCOUNTER — Telehealth (INDEPENDENT_AMBULATORY_CARE_PROVIDER_SITE_OTHER): Payer: Medicare Other | Admitting: Psychiatry

## 2022-02-18 DIAGNOSIS — F411 Generalized anxiety disorder: Secondary | ICD-10-CM | POA: Diagnosis not present

## 2022-02-18 MED ORDER — TEMAZEPAM 30 MG PO CAPS: 30.0000 mg | ORAL_CAPSULE | Freq: Every evening | ORAL | 4 refills | Status: DC | PRN

## 2022-02-18 NOTE — Progress Notes (Signed)
Virtual Visit via Telephone Note  I connected with Alexandria Leach on 02/18/22 at  9:20 AM EST by telephone and verified that I am speaking with the correct person using two identifiers.  Location: Patient: home Provider: office   I discussed the limitations, risks, security and privacy concerns of performing an evaluation and management service by telephone and the availability of in person appointments. I also discussed with the patient that there may be a patient responsible charge related to this service. The patient expressed understanding and agreed to proceed.      I discussed the assessment and treatment plan with the patient. The patient was provided an opportunity to ask questions and all were answered. The patient agreed with the plan and demonstrated an understanding of the instructions.   The patient was advised to call back or seek an in-person evaluation if the symptoms worsen or if the condition fails to improve as anticipated.  I provided 15 minutes of non-face-to-face time during this encounter.   [name], MD  [facility] MD/PA/NP OP Progress Note  02/18/2022 9:22 AM Alexandria Leach  MRN:  [id number]  Chief Complaint:  Chief Complaint  Patient presents with   Depression   Anxiety   Follow-up   HPI: This patient is a 71 year old separated black female lives alone in [place].  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  She has 4 sons.  The patient returns for follow-up after 3 months.  Overall she is doing fairly well.  Her health has been stable and her cardiac status remains good with the Tikosyn.  She is no longer taking an antidepressant or Xanax but her mood remains good and she denies significant anxiety.  She states that she feels well "as long as I stay busy."  She still has pain due to the arthritis but she tries to move through it.  She denies significant depression or anxiety  or thoughts of self-harm or suicide.  Her energy is fairly good.  She is sleeping well with the temazepam Visit Diagnosis:    ICD-10-CM   1. GAD (generalized anxiety disorder)  F41.1       Past Psychiatric History: none  Past Medical History:  Past Medical History:  Diagnosis Date   Ankle fracture, left 02/19/2015   from a fall   Anxiety    Cataract 2014   corrected with surgery   CHF (congestive heart failure) (HCC)    CKD (chronic kidney disease) stage 3, GFR 30-59 ml/min (HCC)    saw nephrologist Dr Harden Mo   DDD (degenerative disc disease), cervical    Depression    Fibromyalgia    Glaucoma    s/p surgery, sees ophtho Q6 mo   History of DVT (deep vein thrombosis) several times latest 2012   receives coumadin while hospitalized   History of kidney stones 2010   History of pulmonary embolism 2001, 2006   completed coumadin courses   History of rheumatic fever x3   HLD (hyperlipidemia)    HTN (hypertension)    Insomnia    Low serum vitamin B12 01/13/2021   Lung nodule 09/22/2012   RLL - 49m, stable since 2014. Thought benign.    Osteoarthritis    shoulders and knees, not RA per Dr DEstanislado Pandy positive ANA, positive Ro   Osteopenia 09/18/2015   DEXA T -1.1 hip, -0.2 spine 08/2015    Personal history of urinary calculi latest 2014  Pneumonia 12/02/2011   PONV (postoperative nausea and vomiting)    Refusal of blood transfusions as patient is Jehovah's Witness    Rheumatic heart disease 1980   s/p mitral valve repair 1980   Sjogren's syndrome ([place])    Trimalleolar fracture of left ankle 02/23/2015    Past Surgical History:  Procedure Laterality Date   BREAST BIOPSY Right 2006   benign   CARDIOVERSION N/A 07/18/2020   Procedure: CARDIOVERSION;  Surgeon: Elouise Munroe, MD;  Location: [facility] ENDOSCOPY;  Service: Cardiovascular;  Laterality: N/A;   CHOLECYSTECTOMY  11/27/2011   Procedure: LAPAROSCOPIC CHOLECYSTECTOMY WITH INTRAOPERATIVE CHOLANGIOGRAM;  Surgeon: Adin Hector, MD;  Location: [place];  Service: General;  Laterality: N/A;  laparoscopic cholecystectomy with choleangiogram umbilical hernia repair   COLONOSCOPY  07/2014   WNL Amedeo Plenty)   COLONOSCOPY  08/2019   3 TAs, rpt 3 yrs ([place])   dexa  08/2012   normal per patient - no records available   ESOPHAGOGASTRODUODENOSCOPY  08/2019   reactive gastropathy, neg H pylori ([place])   EYE SURGERY Bilateral 2014   cataract removal   MITRAL VALVE REPAIR  1980   open heart   ORIF ANKLE FRACTURE Left 02/26/2015   Procedure: OPEN REDUCTION INTERNAL FIXATION (ORIF) LEFT TRIMALLEOLAR ANKLE FRACTURE;  Surgeon: [name], MD;  Location: [place];  Service: Orthopedics;  Laterality: Left;   TUBAL LIGATION  4967   UMBILICAL HERNIA REPAIR  11/27/2011   Procedure: HERNIA REPAIR UMBILICAL ADULT;  Surgeon: Adin Hector, MD;  Location: [place];  Service: General;  Laterality: N/A;   [place]   for fibroids -- partial, ovaries remain    Family Psychiatric History: See below  Family History:  Family History  Problem Relation Age of Onset   Cancer Mother        lung (nonsmoker)   CAD Mother        MI in her 36s   ALS Mother    Kidney disease Father    Alcohol abuse Father    Diabetes Father    Lupus Sister        and niece   Diabetes Sister    Stroke Sister    Depression Sister    Lupus Sister    Blindness Sister    Cancer Maternal Uncle        bone   Stroke Maternal Grandmother    Cancer Brother        bone   Diabetes Brother    Heart attack Brother    Healthy Son    Healthy Son    Healthy Son    Healthy Son    Kidney failure Other        on HD   Breast cancer Neg Hx     Social History:  Social History   Socioeconomic History   Marital status: Married    Spouse name: [name]   Number of children: 4   Years of education: 12   Highest education level: Not on file  Occupational History    Employer: UNEMPLOYED    Comment: Disability  Tobacco Use   Smoking  status: Never    Passive exposure: Past   Smokeless tobacco: Never   Tobacco comments:    Never smoke 08/29/21  Vaping Use   Vaping Use: Never used  Substance and Sexual Activity   Alcohol use: No    Alcohol/week: 0.0 standard drinks of alcohol   Drug use: No   Sexual activity:  Not Currently    Birth control/protection: Surgical  Other Topics Concern   Not on file  Social History Narrative   Lives with son, 1 dog   Occupation: unemployed, on disability for fibromyalgia since 2008.   Edu: HS   Religion: Jehova's witness   Activity: volunteers at senior center   Diet: some water, fruits/vegetables daily   No caffeine use   Social Determinants of Health   Financial Resource Strain: Low Risk  (04/18/2021)   Overall Financial Resource Strain (CARDIA)    Difficulty of Paying Living Expenses: Not very hard  Food Insecurity: No Food Insecurity (02/12/2021)   Hunger Vital Sign    Worried About Running Out of Food in the Last Year: Never true    Ran Out of Food in the Last Year: Never true  Transportation Needs: No Transportation Needs (02/12/2021)   PRAPARE - Hydrologist (Medical): No    Lack of Transportation (Non-Medical): No  Physical Activity: Sufficiently Active (02/12/2021)   Exercise Vital Sign    Days of Exercise per Week: 7 days    Minutes of Exercise per Session: 50 min  Stress: No Stress Concern Present (02/12/2021)   [place]    Feeling of Stress : Only a little  Social Connections: Moderately Integrated (02/12/2021)   Social Connection and Isolation Panel [NHANES]    Frequency of Communication with Friends and Family: More than three times a week    Frequency of Social Gatherings with Friends and Family: Never    Attends Religious Services: More than 4 times per year    Active Member of [employer] or Organizations: No    Attends Archivist Meetings: Never     Marital Status: Married    Allergies:  Allergies  Allergen Reactions   Cymbalta [Duloxetine Hcl] Other (See Comments)    tachycardia   Statins Nausea Only and Other (See Comments)    Muscle cramps also   Sulfa Antibiotics Nausea And Vomiting    Metabolic Disorder Labs: Lab Results  Component Value Date   HGBA1C 6.0 01/04/2022   MPG 126 01/12/2021   MPG 114 10/13/2015   No results found for: "PROLACTIN" Lab Results  Component Value Date   CHOL 201 (H) 12/25/2021   TRIG 72 12/25/2021   HDL 76 12/25/2021   CHOLHDL 2.6 12/25/2021   VLDL 14.4 12/23/2019   LDLCALC 112 (H) 12/25/2021   LDLCALC 104 (H) 01/12/2021   Lab Results  Component Value Date   TSH 2.00 01/04/2022   TSH 1.49 01/12/2021    Therapeutic Level Labs: No results found for: "LITHIUM" No results found for: "VALPROATE" No results found for: "CBMZ"  Current Medications: Current Outpatient Medications  Medication Sig Dispense Refill   antiseptic oral rinse (BIOTENE) LIQD 15 mLs by Mouth Rinse route 2 (two) times daily as needed for dry mouth.     cholecalciferol (VITAMIN D) 1000 UNITS tablet Take 1,000 Units by mouth daily.     co-enzyme Q-10 30 MG capsule Take 30 mg by mouth daily.     cyanocobalamin (VITAMIN B12) 1000 MCG tablet Take 1 tablet (1,000 mcg total) by mouth once a week.     diltiazem (CARDIZEM CD) 360 MG 24 hr capsule TAKE 1 CAPSULE BY MOUTH  DAILY 100 capsule 2   docusate sodium (COLACE) 100 MG capsule Take 100 mg by mouth daily.     dofetilide (TIKOSYN) 250 MCG capsule TAKE 1 CAPSULE(250 MCG)  BY MOUTH TWICE DAILY 180 capsule 1   ELIQUIS 5 MG TABS tablet TAKE 1 TABLET BY MOUTH TWICE  DAILY 200 tablet 1   ezetimibe (ZETIA) 10 MG tablet Take 1 tablet (10 mg total) by mouth at bedtime. 90 tablet 3   furosemide (LASIX) 20 MG tablet Take 3 tablets (60 mg total) by mouth daily. 270 tablet 3   gabapentin (NEURONTIN) 300 MG capsule TAKE 2 CAPSULES BY MOUTH IN THE MORNING 1 CAPSULE BY  MOUTH IN THE  AFTERNOON AND  2 CAPSULES BY MOUTH AT  BEDTIME 450 capsule 3   loratadine (CLARITIN) 10 MG tablet Take 1 tablet (10 mg total) by mouth daily.  11   losartan (COZAAR) 50 MG tablet TAKE 1 TABLET BY MOUTH DAILY 100 tablet 3   lovastatin (MEVACOR) 10 MG tablet Take 1 tablet (10 mg total) by mouth every Monday, Wednesday, and Friday. At night 40 tablet 3   metoprolol tartrate (LOPRESSOR) 50 MG tablet TAKE 1 AND 1/2 TABLETS BY  MOUTH TWICE DAILY 270 tablet 3   pantoprazole (PROTONIX) 40 MG tablet Take 40 mg by mouth daily.     Polyethyl Glycol-Propyl Glycol (SYSTANE) 0.4-0.3 % GEL ophthalmic gel Place 1 drop into both eyes 2 (two) times daily.      potassium chloride (KLOR-CON) 10 MEQ tablet Take 1 tablet (10 mEq total) by mouth daily. 90 tablet 3   RESTASIS 0.05 % ophthalmic emulsion INSTILL ONE DROP IN BOTH  EYES TWO TIMES DAILY 180 each 0   temazepam (RESTORIL) 30 MG capsule Take 1 capsule (30 mg total) by mouth at bedtime as needed for sleep. 30 capsule 4   thiamine (VITAMIN B1) 100 MG tablet Take 1 tablet (100 mg total) by mouth daily.     traMADol (ULTRAM) 50 MG tablet TAKE 1 TABLET BY MOUTH DAILY AS  NEEDED 30 tablet 0   No current facility-administered medications for this visit.     Musculoskeletal: Strength & Muscle Tone: within normal limits Gait & Station: normal Patient leans: N/A  Psychiatric Specialty Exam: Review of Systems  Musculoskeletal:  Positive for arthralgias and myalgias.  All other systems reviewed and are negative.   There were no vitals taken for this visit.There is no height or weight on file to calculate BMI.  General Appearance: NA  Eye Contact:  NA  Speech:  Clear and Coherent  Volume:  Normal  Mood:  Euthymic  Affect:  NA  Thought Process:  Goal Directed  Orientation:  Full (Time, Place, and Person)  Thought Content: WDL   Suicidal Thoughts:  No  Homicidal Thoughts:  No  Memory:  Immediate;   Good Recent;   Good Remote;   Good  Judgement:  Good   Insight:  Fair  Psychomotor Activity:  Normal  Concentration:  Concentration: Good and Attention Span: Good  Recall:  Good  Fund of Knowledge: Good  Language: Good  Akathisia:  No  Handed:  Right  AIMS (if indicated): not done  Assets:  Communication Skills Desire for Improvement Resilience Social Support Talents/Skills  ADL's:  Intact  Cognition: WNL  Sleep:  Good   Screenings: GAD-7    Flowsheet Row Office Visit from 01/11/2022 in [place] at [facility] Visit from 12/28/2019 in [place] at [facility] Visit from 12/22/2018 in [place] at [facility]  Total GAD-7 Score 1 0 5      [place] from 12/11/2017 in [place] at  [place] from 12/09/2016 in [place] at [place] from 11/30/2015 in [place] at [facility]  Total Score (max 30 points ) '20 19 18      '$ PHQ2-9    [place] Visit from 01/11/2022 in [place] at [facility] Video Visit from 11/21/2021 in [place] Video Visit from 08/30/2021 in [place] Video Visit from 05/22/2021 in [place] [facility] Video Visit from 03/30/2021 in [place] [facility]  PHQ-2 Total Score 1 0 0 0 1  PHQ-9 Total Score 4 -- -- -- --      Flowsheet Row ED from 02/09/2022 in [facility] at [facility] Video Visit from 11/21/2021 in [place] [facility] Video Visit from 08/30/2021 in [place] No Risk No Risk No Risk        Assessment and Plan: This patient is a 71 year old female with a history of depression anxiety and Sjogren syndrome as well as difficulty sleeping.  Right now she is on no medicines for  anxiety or depression and is doing well.  She only remains on Restoril 30 mg which helps her sleep.  She will continue this dosage and return to see me in 4 months  Collaboration of Care: Collaboration of Care: Primary Care Provider AEB notes are shared with PCP on the epic system  Patient/Guardian was advised Release of Information must be obtained prior to any record release in order to collaborate their care with an outside provider. Patient/Guardian was advised if they have not already done so to contact the registration department to sign all necessary forms in order for [place] to release information regarding their care.   Consent: Patient/Guardian gives verbal consent for treatment and assignment of benefits for services provided during this visit. Patient/Guardian expressed understanding and agreed to proceed.    [name], MD 02/18/2022, 9:22 AM

## 2022-02-19 ENCOUNTER — Telehealth (HOSPITAL_COMMUNITY): Payer: Self-pay | Admitting: *Deleted

## 2022-02-19 ENCOUNTER — Other Ambulatory Visit (HOSPITAL_COMMUNITY): Payer: Self-pay | Admitting: Psychiatry

## 2022-02-19 ENCOUNTER — Encounter: Payer: Self-pay | Admitting: Family Medicine

## 2022-02-19 ENCOUNTER — Ambulatory Visit (INDEPENDENT_AMBULATORY_CARE_PROVIDER_SITE_OTHER): Payer: Medicare Other | Admitting: Family Medicine

## 2022-02-19 VITALS — BP 130/62 | HR 79 | Temp 97.2°F | Ht 62.5 in | Wt 190.0 lb

## 2022-02-19 DIAGNOSIS — J069 Acute upper respiratory infection, unspecified: Secondary | ICD-10-CM | POA: Diagnosis not present

## 2022-02-19 MED ORDER — LORATADINE 10 MG PO TABS: 10.0000 mg | ORAL_TABLET | Freq: Every day | ORAL | 3 refills | Status: AC

## 2022-02-19 MED ORDER — ALPRAZOLAM 0.5 MG PO TABS: 0.5000 mg | ORAL_TABLET | Freq: Three times a day (TID) | ORAL | 2 refills | Status: DC | PRN

## 2022-02-19 NOTE — Telephone Encounter (Signed)
sent 

## 2022-02-19 NOTE — Telephone Encounter (Signed)
Patient called and stated that she mistakenly informed provider that she no longer take her Xanax. Per pt she would like for provider to please add her Xanax back on her list.

## 2022-02-19 NOTE — Assessment & Plan Note (Addendum)
Anticipate viral URI given short duration and rapid recovery.  Supportive measures reviewed as per pt instructions. Update if not improving as expected.  Pt agrees with plan Recent R postauricular LAD has resolved.

## 2022-02-19 NOTE — Progress Notes (Signed)
Patient ID: Alexandria Leach, female    DOB: 1951-02-21, 71 y.o.   MRN: [id number]  This visit was conducted in person.  BP 130/62   Pulse 79   Temp (!) 97.2 F (36.2 C) (Temporal)   Ht 5' 2.5" (1.588 m)   Wt 190 lb (86.2 kg)   SpO2 97%   BMI 34.20 kg/m    CC: UCC f/u visit  Subjective:   HPI: Alexandria Leach is a 71 y.o. female presenting on 02/19/2022 for Hospitalization Follow-up (Seen on 02/09/22 at [facility], dx lymphadenopathy; adenitis. ) and Nasal Congestion (C/o nasal congestion, body aches and fever- max 103. Sxs started 02/17/22. Neg Cov[facility] test [facility]/18/22 at [facility]. )   Seen at [facility] 02/09/2022 for R postauricular LAD, treated with keflex TID 7d course. LAD was very painful and affected her ability to eat. After abx she started feeling better.  Known Sjogren syndrome followed by rheumatology.   3d ago (02/17/2022) woke up with sore throat, HA, body aches and fever Tmax 100.3. She had negative COVID test through [facility]. Today she feels congested, has had some cough, but over the last 24 hours has started feeling better.   Denies ear pain, PNdrainage, abd pain, nausea, chest pain, dyspnea or wheezing.  No sick contacts at home.   Ran out of claritin - but this was helping congestion. Planning to refill.      Relevant past medical, surgical, family and social history reviewed and updated as indicated. Interim medical history since our last visit reviewed. Allergies and medications reviewed and updated. Outpatient Medications Prior to Visit  Medication Sig Dispense Refill   antiseptic oral rinse (BIOTENE) LIQD 15 mLs by Mouth Rinse route 2 (two) times daily as needed for dry mouth.     cholecalciferol (VITAMIN D) 1000 UNITS tablet Take 1,000 Units by mouth daily.     co-enzyme Q-10 30 MG capsule Take 30 mg by mouth daily.     cyanocobalamin (VITAMIN B12) 1000 MCG tablet Take 1 tablet (1,000 mcg total) by mouth once a week.     diltiazem (CARDIZEM CD) 360 MG 24 hr capsule TAKE  1 CAPSULE BY MOUTH  DAILY 100 capsule 2   docusate sodium (COLACE) 100 MG capsule Take 100 mg by mouth daily.     dofetilide (TIKOSYN) 250 MCG capsule TAKE 1 CAPSULE(250 MCG) BY MOUTH TWICE DAILY 180 capsule 1   ELIQUIS 5 MG TABS tablet TAKE 1 TABLET BY MOUTH TWICE  DAILY 200 tablet 1   ezetimibe (ZETIA) 10 MG tablet Take 1 tablet (10 mg total) by mouth at bedtime. 90 tablet 3   furosemide (LASIX) 20 MG tablet Take 3 tablets (60 mg total) by mouth daily. 270 tablet 3   gabapentin (NEURONTIN) 300 MG capsule TAKE 2 CAPSULES BY MOUTH IN THE MORNING 1 CAPSULE BY  MOUTH IN THE AFTERNOON AND  2 CAPSULES BY MOUTH AT  BEDTIME 450 capsule 3   losartan (COZAAR) 50 MG tablet TAKE 1 TABLET BY MOUTH DAILY 100 tablet 3   lovastatin (MEVACOR) 10 MG tablet Take 1 tablet (10 mg total) by mouth every Monday, Wednesday, and Friday. At night 40 tablet 3   metoprolol tartrate (LOPRESSOR) 50 MG tablet TAKE 1 AND 1/2 TABLETS BY  MOUTH TWICE DAILY 270 tablet 3   pantoprazole (PROTONIX) 40 MG tablet Take 40 mg by mouth daily.     Polyethyl Glycol-Propyl Glycol (SYSTANE) 0.4-0.3 % GEL ophthalmic gel Place 1 drop into both eyes 2 (two) times  daily.      potassium chloride (KLOR-CON) 10 MEQ tablet Take 1 tablet (10 mEq total) by mouth daily. 90 tablet 3   RESTASIS 0.05 % ophthalmic emulsion INSTILL ONE DROP IN BOTH  EYES TWO TIMES DAILY 180 each 0   temazepam (RESTORIL) 30 MG capsule Take 1 capsule (30 mg total) by mouth at bedtime as needed for sleep. 30 capsule 4   thiamine (VITAMIN B1) 100 MG tablet Take 1 tablet (100 mg total) by mouth daily.     traMADol (ULTRAM) 50 MG tablet TAKE 1 TABLET BY MOUTH DAILY AS  NEEDED 30 tablet 0   loratadine (CLARITIN) 10 MG tablet Take 1 tablet (10 mg total) by mouth daily.  11   No facility-administered medications prior to visit.     Per HPI unless specifically indicated in ROS section below Review of Systems  Objective:  BP 130/62   Pulse 79   Temp (!) 97.2 F (36.2 C)  (Temporal)   Ht 5' 2.5" (1.588 m)   Wt 190 lb (86.2 kg)   SpO2 97%   BMI 34.20 kg/m   Wt Readings from Last 3 Encounters:  02/19/22 190 lb (86.2 kg)  01/11/22 197 lb 2 oz (89.4 kg)  12/25/21 199 lb (90.3 kg)      Physical Exam Vitals and nursing note reviewed.  Constitutional:      Appearance: Normal appearance. She is not ill-appearing.  HENT:     Head: Normocephalic and atraumatic.     Right Ear: Tympanic membrane, ear canal and external ear normal. There is no impacted cerumen.     Left Ear: Tympanic membrane, ear canal and external ear normal. There is no impacted cerumen.     Nose: Congestion present. No rhinorrhea.     Right Turbinates: Not enlarged, swollen or pale.     Left Turbinates: Not enlarged, swollen or pale.     Right Sinus: No maxillary sinus tenderness or frontal sinus tenderness.     Left Sinus: No maxillary sinus tenderness or frontal sinus tenderness.     Mouth/Throat:     Mouth: Mucous membranes are moist.     Pharynx: Oropharynx is clear. No oropharyngeal exudate or posterior oropharyngeal erythema.  Eyes:     Extraocular Movements: Extraocular movements intact.     Conjunctiva/sclera: Conjunctivae normal.     Pupils: Pupils are equal, round, and reactive to light.  Cardiovascular:     Rate and Rhythm: Normal rate and regular rhythm.     Pulses: Normal pulses.     Heart sounds: Normal heart sounds. No murmur heard. Pulmonary:     Effort: Pulmonary effort is normal. No respiratory distress.     Breath sounds: Normal breath sounds. No wheezing, rhonchi or rales.  Musculoskeletal:     Cervical back: Normal range of motion and neck supple.  Lymphadenopathy:     Head:     Right side of head: No submental, submandibular, tonsillar, preauricular or posterior auricular adenopathy.     Left side of head: No submental, submandibular, tonsillar, preauricular or posterior auricular adenopathy.     Cervical: No cervical adenopathy.     Right cervical: No  superficial cervical adenopathy.    Left cervical: No superficial cervical adenopathy.     Upper Body:     Right upper body: No supraclavicular adenopathy.     Left upper body: No supraclavicular adenopathy.  Skin:    Findings: No rash.  Neurological:     Mental Status: She is alert.  Psychiatric:        Mood and Affect: Mood normal.        Behavior: Behavior normal.       Results for orders placed or performed in visit on 01/08/22  ECHOCARDIOGRAM COMPLETE  Result Value Ref Range   Area-P 1/2 1.51 cm2   S' Lateral 2.50 cm   AV Area mean vel 1.01 cm2   AR max vel 1.07 cm2   AV Area VTI 1.06 cm2   MV VTI 0.82 cm2   P 1/2 time 491 msec   Ao pk vel 2.30 m/s   AV Mean grad 12.0 mmHg   AV Peak grad 21.2 mmHg    Assessment & Plan:   Problem List Items Addressed This Visit       Unprioritized   Viral URI - Primary    Anticipate viral URI given short duration and rapid recovery.  Supportive measures reviewed as per pt instructions. Update if not improving as expected.  Pt agrees with plan Recent R postauricular LAD has resolved.         Meds ordered this encounter  Medications   loratadine (CLARITIN) 10 MG tablet    Sig: Take 1 tablet (10 mg total) by mouth daily.    Dispense:  90 tablet    Refill:  3   No orders of the defined types were placed in this encounter.    Patient Instructions  You likely had a viral upper respiratory infection that is improving. Antibiotics are not needed for this.  Viral infections usually take 7-10 days to resolve.  The cough can last a few weeks to go away. Restart claritin - sent to pharmacy.  Push fluids and plenty of rest.  Please return if you are not improving as expected, or if you have high fevers (>101.5) or difficulty swallowing or worsening productive cough. Call clinic with questions.   Good to see you today. I'm glad you're feeling better   Follow up plan: Return if symptoms worsen or fail to improve.  Ria Bush, MD

## 2022-02-19 NOTE — Patient Instructions (Signed)
You likely had a viral upper respiratory infection that is improving. Antibiotics are not needed for this.  Viral infections usually take 7-10 days to resolve.  The cough can last a few weeks to go away. Restart claritin - sent to pharmacy.  Push fluids and plenty of rest.  Please return if you are not improving as expected, or if you have high fevers (>101.5) or difficulty swallowing or worsening productive cough. Call clinic with questions.   Good to see you today. I'm glad you're feeling better

## 2022-02-25 ENCOUNTER — Ambulatory Visit (HOSPITAL_COMMUNITY)
Admission: RE | Admit: 2022-02-25 | Discharge: 2022-02-25 | Disposition: A | Discharge status: Home / Self Care | Payer: Medicare Other | Source: Ambulatory Visit | Attending: Physician Assistant | Admitting: Physician Assistant

## 2022-02-25 VITALS — BP 180/80 | HR 74 | Ht 62.5 in | Wt 189.6 lb

## 2022-02-25 DIAGNOSIS — I484 Atypical atrial flutter: Secondary | ICD-10-CM

## 2022-02-25 DIAGNOSIS — N183 Chronic kidney disease, stage 3 unspecified: Secondary | ICD-10-CM | POA: Diagnosis not present

## 2022-02-25 DIAGNOSIS — Z7901 Long term (current) use of anticoagulants: Secondary | ICD-10-CM | POA: Diagnosis not present

## 2022-02-25 DIAGNOSIS — Z79899 Other long term (current) drug therapy: Secondary | ICD-10-CM | POA: Insufficient documentation

## 2022-02-25 DIAGNOSIS — I5032 Chronic diastolic (congestive) heart failure: Secondary | ICD-10-CM | POA: Diagnosis not present

## 2022-02-25 DIAGNOSIS — I130 Hypertensive heart and chronic kidney disease with heart failure and stage 1 through stage 4 chronic kidney disease, or unspecified chronic kidney disease: Secondary | ICD-10-CM | POA: Insufficient documentation

## 2022-02-25 DIAGNOSIS — D6869 Other thrombophilia: Secondary | ICD-10-CM | POA: Insufficient documentation

## 2022-02-25 NOTE — Progress Notes (Signed)
Primary Care Physician: Ria Bush, MD Referring Physician: Dr. Stanford Breed Primary EP: Dr Greig Right Alexandria Leach is a 71 y.o. female with a h/o new onset typical atrial flutter in April 2022. H/o of rheumatic heart disease with remote repair pf mitral valve in 1980, CHF, CKD.  She was placed on anticoagulation and had a succession CV but ERAfl. She felt much improved while in SR. Patient is s/p dofetilide admission 8/8-8/11/22. She converted with medication and did not require DCCV.   On follow up today, patient reports that she has done well since her last visit. She denies any symptoms of tachypalpitations. Her BP is elevated today, no headache, chest pain, or vision changes. No bleeding issues on anticoagulation.   Today, she denies symptoms of palpitations, chest pain, shortness of breath, orthopnea, PND, lower extremity edema, dizziness, presyncope, syncope, or neurologic sequela. The patient is tolerating medications without difficulties and is otherwise without complaint today.   Past Medical History:  Diagnosis Date   Ankle fracture, left 02/19/2015   from a fall   Anxiety    Cataract 2014   corrected with surgery   CHF (congestive heart failure) (HCC)    CKD (chronic kidney disease) stage 3, GFR 30-59 ml/min [facility])    saw nephrologist Dr Harden Mo   DDD (degenerative disc disease), cervical    Depression    Fibromyalgia    Glaucoma    s/p surgery, sees ophtho Q6 mo   History of DVT (deep vein thrombosis) several times latest 2012   receives coumadin while hospitalized   History of kidney stones 2010   History of pulmonary embolism 2001, 2006   completed coumadin courses   History of rheumatic fever x3   HLD (hyperlipidemia)    HTN (hypertension)    Insomnia    Low serum vitamin B12 01/13/2021   Lung nodule 09/22/2012   RLL - 41m, stable since 2014. Thought benign.    Osteoarthritis    shoulders and knees, not RA per Dr DEstanislado Pandy positive ANA, positive Ro    Osteopenia 09/18/2015   DEXA T -1.1 hip, -0.2 spine 08/2015    Personal history of urinary calculi latest 2014   Pneumonia 12/02/2011   PONV (postoperative nausea and vomiting)    Refusal of blood transfusions as patient is Jehovah's Witness    Rheumatic heart disease 1980   s/p mitral valve repair 1980   [place]    Trimalleolar fracture of left ankle 02/23/2015   Past Surgical History:  Procedure Laterality Date   BREAST BIOPSY Right 2006   benign   CARDIOVERSION N/A 07/18/2020   Procedure: CARDIOVERSION;  Surgeon: AElouise Munroe MD;  [facility]  Service: Cardiovascular;  Laterality: N/A;   CHOLECYSTECTOMY  11/27/2011   Procedure: LAPAROSCOPIC CHOLECYSTECTOMY WITH INTRAOPERATIVE CHOLANGIOGRAM;  Surgeon: HAdin Hector MD;  [place]  Service: General;  Laterality: N/A;  laparoscopic cholecystectomy with choleangiogram umbilical hernia repair   COLONOSCOPY  07/2014   WNL ([facility]   COLONOSCOPY  08/2019   3 TAs, rpt 3 yrs ([facility])   dexa  08/2012   normal per patient - no records available   ESOPHAGOGASTRODUODENOSCOPY  08/2019   reactive gastropathy, neg H pylori ([place]   EYE SURGERY Bilateral 2014   cataract removal   MITRAL VALVE REPAIR  1980   open heart   ORIF ANKLE FRACTURE Left 02/26/2015   Procedure: OPEN REDUCTION INTERNAL FIXATION (ORIF) LEFT TRIMALLEOLAR ANKLE FRACTURE;  Surgeon: NLeandrew Koyanagi MD;  Location: [place];  Service: Orthopedics;  Laterality: Left;   TUBAL LIGATION  9735   UMBILICAL HERNIA REPAIR  11/27/2011   Procedure: HERNIA REPAIR UMBILICAL ADULT;  Surgeon: Adin Hector, MD;  Location: [place];  Service: General;  Laterality: N/A;   [place]   for fibroids -- partial, ovaries remain    Current Outpatient Medications  Medication Sig Dispense Refill   ALPRAZolam (XANAX) 0.5 MG tablet Take 1 tablet (0.5 mg total) by mouth 3 (three) times daily as needed for anxiety. 270 tablet 2   antiseptic  oral rinse (BIOTENE) LIQD 15 mLs by Mouth Rinse route 2 (two) times daily as needed for dry mouth.     cholecalciferol (VITAMIN D) 1000 UNITS tablet Take 1,000 Units by mouth daily.     co-enzyme Q-10 30 MG capsule Take 30 mg by mouth daily.     cyanocobalamin (VITAMIN B12) 1000 MCG tablet Take 1 tablet (1,000 mcg total) by mouth once a week.     diltiazem (CARDIZEM CD) 360 MG 24 hr capsule TAKE 1 CAPSULE BY MOUTH  DAILY 100 capsule 2   docusate sodium (COLACE) 100 MG capsule Take 100 mg by mouth daily.     dofetilide (TIKOSYN) 250 MCG capsule TAKE 1 CAPSULE(250 MCG) BY MOUTH TWICE DAILY 180 capsule 1   ELIQUIS 5 MG TABS tablet TAKE 1 TABLET BY MOUTH TWICE  DAILY 200 tablet 1   ezetimibe (ZETIA) 10 MG tablet Take 1 tablet (10 mg total) by mouth at bedtime. 90 tablet 3   furosemide (LASIX) 20 MG tablet Take 3 tablets (60 mg total) by mouth daily. 270 tablet 3   gabapentin (NEURONTIN) 300 MG capsule TAKE 2 CAPSULES BY MOUTH IN THE MORNING 1 CAPSULE BY  MOUTH IN THE AFTERNOON AND  2 CAPSULES BY MOUTH AT  BEDTIME 450 capsule 3   loratadine (CLARITIN) 10 MG tablet Take 1 tablet (10 mg total) by mouth daily. 90 tablet 3   losartan (COZAAR) 50 MG tablet TAKE 1 TABLET BY MOUTH DAILY 100 tablet 3   lovastatin (MEVACOR) 10 MG tablet Take 1 tablet (10 mg total) by mouth every Monday, Wednesday, and Friday. At night 40 tablet 3   metoprolol tartrate (LOPRESSOR) 50 MG tablet TAKE 1 AND 1/2 TABLETS BY  MOUTH TWICE DAILY 270 tablet 3   pantoprazole (PROTONIX) 40 MG tablet Take 40 mg by mouth daily.     Polyethyl Glycol-Propyl Glycol (SYSTANE) 0.4-0.3 % GEL ophthalmic gel Place 1 drop into both eyes 2 (two) times daily.      potassium chloride (KLOR-CON) 10 MEQ tablet Take 1 tablet (10 mEq total) by mouth daily. 90 tablet 3   RESTASIS 0.05 % ophthalmic emulsion INSTILL ONE DROP IN BOTH  EYES TWO TIMES DAILY 180 each 0   temazepam (RESTORIL) 30 MG capsule Take 1 capsule (30 mg total) by mouth at bedtime as needed  for sleep. 30 capsule 4   thiamine (VITAMIN B1) 100 MG tablet Take 1 tablet (100 mg total) by mouth daily.     traMADol (ULTRAM) 50 MG tablet TAKE 1 TABLET BY MOUTH DAILY AS  NEEDED 30 tablet 0   No current facility-administered medications for this encounter.    Allergies  Allergen Reactions   Cymbalta [Duloxetine Hcl] Other (See Comments)    tachycardia   Statins Nausea Only and Other (See Comments)    Muscle cramps also   Sulfa Antibiotics Nausea And Vomiting    Social History   Socioeconomic  History   Marital status: Married    Spouse name: [name]   Number of children: 4   Years of education: 12   Highest education level: Not on file  Occupational History    Employer: UNEMPLOYED    Comment: Disability  Tobacco Use   Smoking status: Never    Passive exposure: Past   Smokeless tobacco: Never   Tobacco comments:    Never smoke 08/29/21  Vaping Use   Vaping Use: Never used  Substance and Sexual Activity   Alcohol use: No    Alcohol/week: 0.0 standard drinks of alcohol   Drug use: No   Sexual activity: Not Currently    Birth control/protection: Surgical  Other Topics Concern   Not on file  Social History Narrative   Lives with son, 1 dog   Occupation: unemployed, on disability for fibromyalgia since 2008.   Edu: HS   Religion: Jehova's witness   Activity: volunteers at senior center   Diet: some water, fruits/vegetables daily   No caffeine use   Social Determinants of Health   Financial Resource Strain: Low Risk  (04/18/2021)   Overall Financial Resource Strain (CARDIA)    Difficulty of Paying Living Expenses: Not very hard  Food Insecurity: No Food Insecurity (02/12/2021)   Hunger Vital Sign    Worried About Running Out of Food in the Last Year: Never true    Ran Out of Food in the Last Year: Never true  Transportation Needs: No Transportation Needs (02/12/2021)   PRAPARE - Hydrologist (Medical): No    Lack of Transportation  (Non-Medical): No  Physical Activity: Sufficiently Active (02/12/2021)   Exercise Vital Sign    Days of Exercise per Week: 7 days    Minutes of Exercise per Session: 50 min  Stress: No Stress Concern Present (02/12/2021)   [employer]    Feeling of Stress : Only a little  Social Connections: Moderately Integrated (02/12/2021)   Social Connection and Isolation Panel [NHANES]    Frequency of Communication with Friends and Family: More than three times a week    Frequency of Social Gatherings with Friends and Family: Never    Attends Religious Services: More than 4 times per year    Active Member of [employer] or Organizations: No    Attends Archivist Meetings: Never    Marital Status: Married  Human resources officer Violence: Not At Risk (02/12/2021)   Humiliation, Afraid, Rape, and Kick questionnaire    Fear of Current or Ex-Partner: No    Emotionally Abused: No    Physically Abused: No    Sexually Abused: No    Family History  Problem Relation Age of Onset   Cancer Mother        lung (nonsmoker)   CAD Mother        MI in her 60s   ALS Mother    Kidney disease Father    Alcohol abuse Father    Diabetes Father    Lupus Sister        and niece   Diabetes Sister    Stroke Sister    Depression Sister    Lupus Sister    Blindness Sister    Cancer Maternal Uncle        bone   Stroke Maternal Grandmother    Cancer Brother        bone   Diabetes Brother    Heart attack Brother  Healthy Son    Healthy Son    Healthy Son    Healthy Son    Kidney failure Other        on HD   Breast cancer Neg Hx     ROS- All systems are reviewed and negative except as per the HPI above  Physical Exam: Vitals:   02/25/22 1119  BP: (!) 180/80  Pulse: 74  Weight: 86 kg  Height: 5' 2.5" (1.588 m)    Wt Readings from Last 3 Encounters:  02/25/22 86 kg  02/19/22 86.2 kg  01/11/22 89.4 kg    Labs: Lab Results   Component Value Date   NA 143 12/25/2021   K 4.2 12/25/2021   CL 108 (H) 12/25/2021   CO2 22 12/25/2021   GLUCOSE 93 12/25/2021   BUN 11 12/25/2021   CREATININE 1.22 (H) 12/25/2021   CALCIUM 10.1 12/25/2021   PHOS 3.2 07/11/2020   MG 1.9 12/25/2021   Lab Results  Component Value Date   INR 0.93 10/12/2015   Lab Results  Component Value Date   CHOL 201 (H) 12/25/2021   HDL 76 12/25/2021   LDLCALC 112 (H) 12/25/2021   TRIG 72 12/25/2021    GEN- The patient is a well appearing female, alert and oriented x 3 today.   HEENT-head normocephalic, atraumatic, sclera clear, conjunctiva pink, hearing intact, trachea midline. Lungs- Clear to ausculation bilaterally, normal work of breathing Heart- Regular rate and rhythm, no murmurs, rubs or gallops  GI- soft, NT, ND, + BS Extremities- no clubbing, cyanosis, or edema MS- no significant deformity or atrophy Skin- no rash or lesion Psych- euthymic mood, full affect Neuro- strength and sensation are intact    EKG-  SR, NST Vent. rate 74 BPM PR interval 174 ms QRS duration 78 ms QT/QTcB 396/439 ms   CHA2DS2-VASc Score = 4  The patient's score is based upon: CHF History: 1 HTN History: 1 Diabetes History: 0 Stroke History: 0 Vascular Disease History: 0 Age Score: 1 Gender Score: 1        ASSESSMENT AND PLAN: 1. Atypical atrial flutter The patient's CHA2DS2-VASc score is 4, indicating a 4.8% annual risk of stroke.   S/p dofetilide admission 8/8-8/11/22 Patient appears to be maintaining SR. Continue dofetilide 250 mcg BID. QT stable. Recent lab work 12/2021 reviewed, OK for dofetilide.  Continue Eliquis 5 mg BID Continue diltiazem 360 mg daily Continue Lopressor 75 mg BID  2. Secondary Hypercoagulable State (ICD10:  D68.69) The patient is at significant risk for stroke/thromboembolism based upon her CHA2DS2-VASc Score of 4.  Continue Apixaban (Eliquis).   3. HFpEF  Fluid status appears stable today.  4.  HTN Elevated today, was well controlled at her visit with Dr Stanford Breed. Asymptomatic. Patient to keep BP log at home to see if medications need to be adjusted.    Follow up in the AF clinic in 6 months.    [facility] [address] [place], [place] [address] [phone]

## 2022-03-22 ENCOUNTER — Other Ambulatory Visit: Payer: Self-pay | Admitting: Cardiology

## 2022-03-29 NOTE — Progress Notes (Signed)
Office Visit Note  Patient: Alexandria Leach             Date of Birth: 1950-11-13           MRN: [id number]             PCP: Ria Bush, MD Referring: Ria Bush, MD Visit Date: 04/12/2022 Occupation: '@GUAROCC'$ @  Subjective:  Pain in multiple joints   History of Present Illness: Alexandria Leach is a 72 y.o. female with history of sjogren's syndrome, osteoarthritis, and DDD.  She is not currently taking any immunosuppressive agents.  Patient presents today with ongoing sicca symptoms.  She has been using Biotene and Systane for symptomatic relief.  She states that her sicca symptoms have been slightly worse this winter time.  She states she is also been experiencing increased joint pain especially in both hands and her right ankle.  She states that her joint pain has been significantly worse for the past 1 month.  She continues to take tramadol very sparingly.  The tramadol has not been helping with the pain she experiences in her hands.  She denies any obvious joint swelling in her hands but has noticed some puffiness in the right ankle.  She states she was previously evaluated by podiatry about 1 year ago and had x-rays at that time which did not reveal any acute abnormalities.  There was concern for the ankle joint pain being secondary to Sjogren's syndrome at that time.  She denies any new or worsening pulmonary symptoms.   Activities of Daily Living:  Patient reports morning stiffness for 4-5 hours.   Patient Reports nocturnal pain.  Difficulty dressing/grooming: Reports Difficulty climbing stairs: Reports Difficulty getting out of chair: Denies Difficulty using hands for taps, buttons, cutlery, and/or writing: Reports  Review of Systems  Constitutional:  Positive for fatigue.  HENT:  Positive for mouth dryness. Negative for mouth sores.   Eyes:  Positive for dryness.  Respiratory:  Negative for shortness of breath.   Cardiovascular:  Negative for chest  pain and palpitations.  Gastrointestinal:  Negative for blood in stool, constipation and diarrhea.  Endocrine: Negative for increased urination.  Genitourinary:  Negative for involuntary urination.  Musculoskeletal:  Positive for joint pain, joint pain, joint swelling, myalgias, morning stiffness and myalgias. Negative for gait problem, muscle weakness and muscle tenderness.  Skin:  Negative for color change, rash, hair loss and sensitivity to sunlight.  Allergic/Immunologic: Negative for susceptible to infections.  Neurological:  Negative for dizziness and headaches.  Hematological:  Negative for swollen glands.  Psychiatric/Behavioral:  Positive for sleep disturbance. Negative for depressed mood. The patient is nervous/anxious.     PMFS History:  Patient Active Problem List   Diagnosis Date Noted   Viral URI 02/19/2022   Leg pain, bilateral 01/11/2022   Rhinorrhea 10/03/2021   Pruritus 10/03/2021   COVID-19 09/21/2021   Pedal edema 08/22/2021   Chronic pain of right knee 07/11/2021   Chronic GERD 01/30/2021   History of adenomatous polyp of colon 01/30/2021   Left sided abdominal pain 01/30/2021   Vitamin B1 deficiency 01/18/2021   Low serum vitamin B12 01/13/2021   Secondary hypercoagulable state ([place]) 10/23/2020   Prediabetes 07/11/2020   Snoring 06/23/2020   Atrial flutter ([place]) 06/23/2020   Dysphagia 05/15/2020   Secondary hyperparathyroidism of renal origin ([place]) 12/28/2019   Constipation 12/28/2019   Sinus tachycardia 05/27/2018   Transient alteration of awareness 04/04/2018   Abnormal serum thyroid stimulating hormone (TSH) level 01/30/2018  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   Thyroid nodule 09/16/2017   Neck pain on right side 05/14/2017   Sialoadenitis of submandibular gland 10/29/2016   High risk medication use 06/05/2016   Peripheral neuropathy 01/30/2016   DNR no code (do not resuscitate) 12/08/2015   Asymmetrical right sensorineural hearing loss  12/08/2015   TIA (transient ischemic attack) 10/12/2015   Mild mitral regurgitation 10/12/2015   Osteopenia 09/18/2015   Right arm pain 08/29/2015   History of fall    Right sided sciatica 02/21/2015   Imbalance 01/12/2015   Transaminitis 12/24/2014   DDD (degenerative disc disease), cervical    Health maintenance examination 10/18/2014   Advanced care planning/counseling discussion 10/18/2014   Medicare annual wellness visit, subsequent 10/18/2014   Refusal of blood transfusions as patient is Jehovah's Witness    Sjogren's syndrome ([place])    CKD (chronic kidney disease) stage 3, GFR 30-59 ml/min (HCC)    Glaucoma    Fibromyalgia    Osteoarthritis    History of pulmonary embolism    Lung nodule 09/22/2012   (HFpEF) heart failure with preserved ejection fraction ([place]) 12/04/2011   Aortic stenosis 04/22/2011   Palpitations 08/10/2010   HOT FLASHES 06/28/2008   GAD (generalized anxiety disorder) 03/28/2006   History of mitral valve repair 03/28/2006   History of total abdominal hysterectomy 03/28/2006   HYPERCHOLESTEROLEMIA 12/31/2005   MDD (major depressive disorder), recurrent episode ([place]) 12/31/2005   RHEUMATIC HEART DISEASE 12/31/2005   Essential hypertension 12/31/2005   Chronic insomnia 12/31/2005    Past Medical History:  Diagnosis Date   Ankle fracture, left 02/19/2015   from a fall   Anxiety    Cataract 2014   corrected with surgery   CHF (congestive heart failure) (HCC)    CKD (chronic kidney disease) stage 3, GFR 30-59 ml/min (HCC)    saw nephrologist Dr Harden Mo   DDD (degenerative disc disease), cervical    Depression    Fibromyalgia    Glaucoma    s/p surgery, sees ophtho Q6 mo   History of DVT (deep vein thrombosis) several times latest 2012   receives coumadin while hospitalized   History of kidney stones 2010   History of pulmonary embolism 2001, 2006   completed coumadin courses   History of rheumatic fever x3   HLD (hyperlipidemia)    HTN  (hypertension)    Insomnia    Low serum vitamin B12 01/13/2021   Lung nodule 09/22/2012   RLL - 39m, stable since 2014. Thought benign.    Osteoarthritis    shoulders and knees, not RA per Dr DEstanislado Pandy positive ANA, positive Ro   Osteopenia 09/18/2015   DEXA T -1.1 hip, -0.2 spine 08/2015    Personal history of urinary calculi latest 2014   Pneumonia 12/02/2011   PONV (postoperative nausea and vomiting)    Refusal of blood transfusions as patient is Jehovah's Witness    Rheumatic heart disease 1980   s/p mitral valve repair 1980   Sjogren's syndrome ([place]    Trimalleolar fracture of left ankle 02/23/2015    Family History  Problem Relation Age of Onset   Cancer Mother        lung (nonsmoker)   CAD Mother        MI in her 644s  ALS Mother    Kidney disease Father    Alcohol abuse Father    Diabetes Father    Lupus Sister        and niece   Diabetes  Sister    Stroke Sister    Depression Sister    Lupus Sister    Blindness Sister    Cancer Maternal Uncle        bone   Stroke Maternal Grandmother    Cancer Brother        bone   Diabetes Brother    Heart attack Brother    Healthy Son    Healthy Son    Healthy Son    Healthy Son    Kidney failure Other        on HD   Breast cancer Neg Hx    Past Surgical History:  Procedure Laterality Date   BREAST BIOPSY Right 2006   benign   CARDIOVERSION N/A 07/18/2020   Procedure: CARDIOVERSION;  Surgeon: Elouise Munroe, MD;  Location: [facility] END[facility];  Service: Cardiovascular;  Laterality: N/A;   CHOLECYSTECTOMY  11/27/2011   Procedure: LAPAROSCOPIC CHOLECYSTECTOMY WITH INTRAOPERATIVE CHOLANGIOGRAM;  Surgeon: Adin Hector, MD;  Location: [facility];  Service: General;  Laterality: N/A;  laparoscopic cholecystectomy with choleangiogram umbilical hernia repair   COLONOSCOPY  07/2014   WNL [facility])   COLONOSCOPY  08/2019   3 TAs, rpt 3 yrs ([place])   dexa  08/2012   normal per patient - no records available    ESOPHAGOGASTRODUODENOSCOPY  08/2019   reactive gastropathy, neg H pylori ([place])   EYE SURGERY Bilateral 2014   cataract removal   MITRAL VALVE REPAIR  1980   open heart   ORIF ANKLE FRACTURE Left 02/26/2015   Procedure: OPEN REDUCTION INTERNAL FIXATION (ORIF) LEFT TRIMALLEOLAR ANKLE FRACTURE;  Surgeon: [name], MD;  Location: [facility];  Service: Orthopedics;  Laterality: Left;   TUBAL LIGATION  9326   UMBILICAL HERNIA REPAIR  11/27/2011   Procedure: HERNIA REPAIR UMBILICAL ADULT;  Surgeon: Adin Hector, MD;  Location: [facility];  Service: General;  Laterality: N/A;   [place]   for fibroids -- partial, ovaries remain   Social History   Social History Narrative   Lives with son, 1 dog   Occupation: unemployed, on disability for fibromyalgia since 2008.   Edu: HS   Religion: Jehova's witness   Activity: volunteers at senior center   Diet: some water, fruits/vegetables daily   No caffeine use   Immunization History  Administered Date(s) Administered   COVID-19, mRNA, vaccine(Comirnaty)12 years and older 12/25/2021   PFIZER(Purple Top)SARS-COV-2 Vaccination 05/12/2019, 06/09/2019, 01/06/2020   Pfizer Covid-19 Vaccine Bivalent Booster 72yr & up 02/13/2021   Pneumococcal Conjugate-13 12/15/2017   Pneumococcal Polysaccharide-23 09/15/2012, 12/22/2018     Objective: Vital Signs: BP 138/79 (BP Location: Left Arm, Patient Position: Sitting, Cuff Size: Large)   Pulse 73   Ht '5\' 1"'$  (1.549 m)   Wt 190 lb (86.2 kg)   BMI 35.90 kg/m    Physical Exam Vitals and nursing note reviewed.  Constitutional:      Appearance: She is well-developed.  HENT:     Head: Normocephalic and atraumatic.  Eyes:     Conjunctiva/sclera: Conjunctivae normal.  Cardiovascular:     Rate and Rhythm: Normal rate and regular rhythm.     Heart sounds: Murmur heard.  Pulmonary:     Effort: Pulmonary effort is normal.     Breath sounds: Normal breath sounds.  Abdominal:      General: Bowel sounds are normal.     Palpations: Abdomen is soft.  Musculoskeletal:     Cervical back: Normal range of motion.  Skin:  General: Skin is warm and dry.     Capillary Refill: Capillary refill takes less than 2 seconds.  Neurological:     Mental Status: She is alert and oriented to person, place, and time.  Psychiatric:        Behavior: Behavior normal.      Musculoskeletal Exam: C-spine, thoracic spine, and lumbar spine good ROM.  Shoulder joints, elbow joints, wrist joints, MCPs, PIPs, and DIPs good ROM with no synovitis.  Tenderness over 2nd-5th MCP joints but no obvious synovitis noted.  Complete fist formation bilaterally.  Hip joints have good ROM with no groin pain.  Knee joints have good ROM with no warmth or effusion.  Tenderness of the right ankle joint.  No evidence of achilles tendonitis or plantar fasciitis.  No tenderness or synovitis of MTP joints.    CDAI Exam: CDAI Score: -- Patient Global: --; Provider Global: -- Swollen: --; Tender: -- Joint Exam 04/12/2022   No joint exam has been documented for this visit   There is currently no information documented on the homunculus. Go to the Rheumatology activity and complete the homunculus joint exam.  Investigation: No additional findings.  Imaging: XR Hand 2 View Left  Result Date: 04/12/2022 CMC, PIP and DIP narrowing was noted.  No MCP, intercarpal or radiocarpal joint space narrowing was noted.  No erosive changes were noted. Impression: These findings are consistent with osteoarthritis of the hand.  XR Hand 2 View Right  Result Date: 04/12/2022 CMC, PIP and DIP narrowing was noted.  No MCP, intercarpal or radiocarpal joint space narrowing was noted.  No erosive changes were noted. Impression: These findings are consistent with osteoarthritis of the hand.  XR Ankle Complete Right  Result Date: 04/12/2022 No tibiotalar or subtalar joint space narrowing was noted.  Notable changes were noted.   Dorsal spurring of tarsal joints was noted. Impression: Unremarkable x-rays of the ankle joint.  Dorsal spurring was noted.   Recent Labs: Lab Results  Component Value Date   WBC 5.2 10/11/2021   HGB 12.1 10/11/2021   PLT 283 10/11/2021   NA 143 12/25/2021   K 4.2 12/25/2021   CL 108 (H) 12/25/2021   CO2 22 12/25/2021   GLUCOSE 93 12/25/2021   BUN 11 12/25/2021   CREATININE 1.22 (H) 12/25/2021   BILITOT 0.3 12/25/2021   ALKPHOS 70 12/25/2021   AST 25 12/25/2021   ALT 22 12/25/2021   PROT 7.2 12/25/2021   ALBUMIN 4.4 12/25/2021   CALCIUM 10.1 12/25/2021   GFRAA 51 (L) 11/11/2019    Speciality Comments: PLQ Eye Exam: 08/03/2018 WNL @ [facility] Follow up in 1 year  Procedures:  No procedures performed Allergies: Cymbalta [duloxetine hcl], Statins, and Sulfa antibiotics   Assessment / Plan:     Visit Diagnoses: Sjogren's syndrome with keratoconjunctivitis sicca ([place]) - +ANA, +Ro: She is not currently taking immunosuppressive agents.  She discontinued Plaquenil in February 2021.  Patient continues to have chronic sicca symptoms.  Her symptoms have been more active and severe over the past 1 month.  She has been using Biotene oral rinse and Systane and restasis eyedrops for symptomatic relief.  No cervical lymphadenopathy currently.  She is not experiencing any new or worsening pulmonary symptoms.  Discussed the increased risk for developing ILD in patients with Sjogren's syndrome.  I also discussed the increased risk (4-14 fold) for developing lymphoma in patients with Sjogren's syndrome.  She presents today experiencing increased pain and stiffness involving both hands and  the right ankle.  Patient reports that initially the right ankle joint was bothersome starting about 2 years ago.  She was evaluated by podiatrist in the past and was told that her symptoms could be due to Sjogren's syndrome.  She has been experiencing pain in both hands for the past 1 month.  She has not noticed  any joint swelling but her hand pain has not responded to the use of tramadol.  On examination she has tenderness over the second through fifth MCP joints but no obvious synovitis was noted.  X-rays of both hands and the right ankle were obtained today for further evaluation.  The following lab work was also obtained for further workup.  If her symptoms are persist or worsen I would recommend proceeding with an ultrasound of both hands to assess for synovitis.  She was in agreement.  She will follow up in 3 months or sooner if needed. - Plan: COMPLETE METABOLIC PANEL WITH GFR, CBC with Differential/Platelet, Urinalysis, Routine w reflex microscopic, ANA, C3 and C4, Sedimentation rate, Serum protein electrophoresis with reflex, Sjogrens syndrome-A extractable nuclear antibody, Sjogrens syndrome-B extractable nuclear antibody  High risk medication use - Discontinued PLQ in Feb 2021.  She is not taking any immunosuppressive agents at this time.  CBC and CMP were updated today.  UDS was also obtained.  Continues to take tramadol sparingly for pain relief.- Plan: DRUG MONITOR, TRAMADOL,QN, URINE, DRUG MONITOR, PANEL 5, W/CONF, URINE, COMPLETE METABOLIC PANEL WITH GFR, CBC with Differential/Platelet  Pain in both hands -Patient presents today experiencing increased pain and stiffness in both hands.  She has tenderness over bilateral second through fifth MCP joints but no obvious synovitis was noted.  Her hand pain has not responded to the use of tramadol.  X-rays of both hands were updated today.  The following lab work will also be obtained for further evaluation.  Discussed proceeding with an ultrasound of both hands if results are inconclusive to assess for synovitis.  Plan: XR Hand 2 View Right, XR Hand 2 View Left, Sedimentation rate, Rheumatoid factor, Cyclic citrul peptide antibody, IgG, C-reactive protein  Pain in right ankle and joints of right foot - She presents today with acute on chronic pain in the  right ankle joint.  No recent injury or fall.  Her right ankle joint pain initially started 2 years ago.  She was evaluated by podiatry about 1 year ago and had x-rays at that time.  X-rays of the right ankle along with inflammatory markers were obtained today for further evaluation. Plan: XR Ankle Complete Right, Uric acid, C-reactive protein  Primary osteoarthritis of right knee - X-rays from April 2023 which were consistent with osteoarthritis, right knee joint injection in April by Dr. Edilia Bo.  No warmth or effusion noted today.   DDD (degenerative disc disease), cervical: C-spine has good range of motion with no discomfort at this time.  Osteopenia of multiple sites - Jul 16, 2021 repeat DEXA scan which showed T score of -1.1 and BMD 0.880 in the left femoral neck.  She is taking vitamin D 1000 units daily.  Fibromyalgia: She experiences intermittent myalgias and muscle tenderness due to fibromyalgia.  She continues to experience intermittent fatigue secondary to insomnia.  Discussed the importance of regular exercise and good sleep hygiene.  She continues to take tramadol sparingly for pain relief.  She is also taking Restoril at bedtime for insomnia.  Other chronic pain - Tramadol as needed for pain relief.  She continues to take  tramadol sparingly for pain relief.  UDS & narcotic agreement: 10/11/2021.  UDS updated today.- Plan: DRUG MONITOR, TRAMADOL,QN, URINE, DRUG MONITOR, PANEL 5, W/CONF, URINE  Other insomnia: She takes Restoril 30 mg at bedtime for insomnia.  Other fatigue: Chronic and secondary to insomnia.  Other medical conditions are listed as follows:   Stage 3a chronic kidney disease ([place])  History of hypertension: BP was 138/79 today in the office.    Typical atrial flutter [facility]) - Hospital admission on 10/23/2020 at which time the patient initiated Tikosyn.  History of pulmonary embolism  History of hypercholesterolemia  Chronic anticoagulation - Eliquis 5 mg twice  daily for pulmonary embolism.  Thyroid nodule - Thyroid ultrasound obtained on 10/27/19.  History of anxiety   Orders: Orders Placed This Encounter  Procedures   XR Hand 2 View Right   XR Hand 2 View Left   XR Ankle Complete Right   DRUG MONITOR, TRAMADOL,QN, URINE   DRUG MONITOR, PANEL 5, W/CONF, URINE   COMPLETE METABOLIC PANEL WITH GFR   CBC with Differential/Platelet   Urinalysis, Routine w reflex microscopic   ANA   C3 and C4   Sedimentation rate   Rheumatoid factor   Cyclic citrul peptide antibody, IgG   Serum protein electrophoresis with reflex   Sjogrens syndrome-A extractable nuclear antibody   Sjogrens syndrome-B extractable nuclear antibody   Uric acid   C-reactive protein   No orders of the defined types were placed in this encounter.     Follow-Up Instructions: Return in about 3 months (around 07/12/2022) for Sjogren's syndrome, Osteoarthritis, Fibromyalgia.   Ofilia Neas, PA-C  Note - This record has been created using Dragon software.  Chart creation errors have been sought, but may not always  have been located. Such creation errors do not reflect on  the standard of medical care.

## 2022-04-10 ENCOUNTER — Telehealth: Payer: Self-pay

## 2022-04-10 NOTE — Progress Notes (Signed)
Care Management & Coordination Services Pharmacy Team  Reason for Encounter: Appointment Reminder  Contacted patient to confirm telephone appointment with Charlene Brooke,  PharmD, on 04/15/2022 at 11:00 . Unsuccessful outreach. Left voicemail for patient to return call.  Medications: Outpatient Encounter Medications as of 04/10/2022  Medication Sig   ALPRAZolam (XANAX) 0.5 MG tablet Take 1 tablet (0.5 mg total) by mouth 3 (three) times daily as needed for anxiety.   antiseptic oral rinse (BIOTENE) LIQD 15 mLs by Mouth Rinse route 2 (two) times daily as needed for dry mouth.   cholecalciferol (VITAMIN D) 1000 UNITS tablet Take 1,000 Units by mouth daily.   co-enzyme Q-10 30 MG capsule Take 30 mg by mouth daily.   cyanocobalamin (VITAMIN B12) 1000 MCG tablet Take 1 tablet (1,000 mcg total) by mouth once a week.   diltiazem (CARDIZEM CD) 360 MG 24 hr capsule TAKE 1 CAPSULE BY MOUTH  DAILY   docusate sodium (COLACE) 100 MG capsule Take 100 mg by mouth daily.   dofetilide (TIKOSYN) 250 MCG capsule TAKE 1 CAPSULE(250 MCG) BY MOUTH TWICE DAILY   ELIQUIS 5 MG TABS tablet TAKE 1 TABLET BY MOUTH TWICE  DAILY   ezetimibe (ZETIA) 10 MG tablet Take 1 tablet (10 mg total) by mouth at bedtime.   furosemide (LASIX) 20 MG tablet Take 3 tablets (60 mg total) by mouth daily.   gabapentin (NEURONTIN) 300 MG capsule TAKE 2 CAPSULES BY MOUTH IN THE MORNING 1 CAPSULE BY  MOUTH IN THE AFTERNOON AND  2 CAPSULES BY MOUTH AT  BEDTIME   loratadine (CLARITIN) 10 MG tablet Take 1 tablet (10 mg total) by mouth daily.   losartan (COZAAR) 50 MG tablet TAKE 1 TABLET BY MOUTH DAILY   lovastatin (MEVACOR) 10 MG tablet Take 1 tablet (10 mg total) by mouth every Monday, Wednesday, and Friday. At night   metoprolol tartrate (LOPRESSOR) 50 MG tablet TAKE 1 AND 1/2 TABLETS BY MOUTH  TWICE DAILY   pantoprazole (PROTONIX) 40 MG tablet Take 40 mg by mouth daily.   Polyethyl Glycol-Propyl Glycol (SYSTANE) 0.4-0.3 % GEL ophthalmic  gel Place 1 drop into both eyes 2 (two) times daily.    potassium chloride (KLOR-CON) 10 MEQ tablet Take 1 tablet (10 mEq total) by mouth daily.   RESTASIS 0.05 % ophthalmic emulsion INSTILL ONE DROP IN BOTH  EYES TWO TIMES DAILY   temazepam (RESTORIL) 30 MG capsule Take 1 capsule (30 mg total) by mouth at bedtime as needed for sleep.   thiamine (VITAMIN B1) 100 MG tablet Take 1 tablet (100 mg total) by mouth daily.   traMADol (ULTRAM) 50 MG tablet TAKE 1 TABLET BY MOUTH DAILY AS  NEEDED   No facility-administered encounter medications on file as of 04/10/2022.   Lab Results  Component Value Date/Time   HGBA1C 6.0 01/04/2022 07:19 AM   HGBA1C 6.0 (H) 01/12/2021 03:02 PM   MICROALBUR <0.7 01/04/2022 07:19 AM   MICROALBUR 4.8 (H) 12/23/2019 07:57 AM    BP Readings from Last 3 Encounters:  02/25/22 (!) 180/80  02/19/22 130/62  02/09/22 139/77    Star Rating Drugs:  Medication:  Last Fill: Day Supply Losartan 50 mg 01/16/2022 100 Lovastatin 10 mg 02/04/2022 100  Care Gaps: Annual wellness visit in last year? Yes 02/13/2022  Charlene Brooke, PharmD notified  Marijean Niemann, [facility] Assistant [phone]

## 2022-04-11 ENCOUNTER — Other Ambulatory Visit: Payer: Self-pay | Admitting: Cardiology

## 2022-04-12 ENCOUNTER — Ambulatory Visit: Payer: 59 | Attending: Physician Assistant | Admitting: Physician Assistant

## 2022-04-12 ENCOUNTER — Encounter: Payer: Self-pay | Admitting: Physician Assistant

## 2022-04-12 ENCOUNTER — Ambulatory Visit (INDEPENDENT_AMBULATORY_CARE_PROVIDER_SITE_OTHER): Payer: 59

## 2022-04-12 ENCOUNTER — Ambulatory Visit: Payer: 59

## 2022-04-12 VITALS — BP 138/79 | HR 73 | Ht 61.0 in | Wt 190.0 lb

## 2022-04-12 DIAGNOSIS — M25571 Pain in right ankle and joints of right foot: Secondary | ICD-10-CM | POA: Diagnosis not present

## 2022-04-12 DIAGNOSIS — M79641 Pain in right hand: Secondary | ICD-10-CM

## 2022-04-12 DIAGNOSIS — M797 Fibromyalgia: Secondary | ICD-10-CM

## 2022-04-12 DIAGNOSIS — M1711 Unilateral primary osteoarthritis, right knee: Secondary | ICD-10-CM | POA: Diagnosis not present

## 2022-04-12 DIAGNOSIS — Z8679 Personal history of other diseases of the circulatory system: Secondary | ICD-10-CM

## 2022-04-12 DIAGNOSIS — M8589 Other specified disorders of bone density and structure, multiple sites: Secondary | ICD-10-CM | POA: Diagnosis not present

## 2022-04-12 DIAGNOSIS — I483 Typical atrial flutter: Secondary | ICD-10-CM | POA: Diagnosis not present

## 2022-04-12 DIAGNOSIS — G4709 Other insomnia: Secondary | ICD-10-CM

## 2022-04-12 DIAGNOSIS — M79642 Pain in left hand: Secondary | ICD-10-CM

## 2022-04-12 DIAGNOSIS — Z79899 Other long term (current) drug therapy: Secondary | ICD-10-CM | POA: Diagnosis not present

## 2022-04-12 DIAGNOSIS — E041 Nontoxic single thyroid nodule: Secondary | ICD-10-CM

## 2022-04-12 DIAGNOSIS — M3501 Sicca syndrome with keratoconjunctivitis: Secondary | ICD-10-CM | POA: Diagnosis not present

## 2022-04-12 DIAGNOSIS — N1831 Chronic kidney disease, stage 3a: Secondary | ICD-10-CM

## 2022-04-12 DIAGNOSIS — G8929 Other chronic pain: Secondary | ICD-10-CM | POA: Diagnosis not present

## 2022-04-12 DIAGNOSIS — Z7901 Long term (current) use of anticoagulants: Secondary | ICD-10-CM

## 2022-04-12 DIAGNOSIS — M5030 Other cervical disc degeneration, unspecified cervical region: Secondary | ICD-10-CM | POA: Diagnosis not present

## 2022-04-12 DIAGNOSIS — Z8639 Personal history of other endocrine, nutritional and metabolic disease: Secondary | ICD-10-CM

## 2022-04-12 DIAGNOSIS — Z86711 Personal history of pulmonary embolism: Secondary | ICD-10-CM

## 2022-04-12 DIAGNOSIS — R5383 Other fatigue: Secondary | ICD-10-CM

## 2022-04-12 DIAGNOSIS — Z8659 Personal history of other mental and behavioral disorders: Secondary | ICD-10-CM

## 2022-04-12 NOTE — Progress Notes (Signed)
X-rays were unremarkable of the ankle joint.   Dorsal spurring noted. Please notify the patient.

## 2022-04-12 NOTE — Progress Notes (Signed)
X-rays of both hands are consistent with osteoarthritis.  No erosive changes noted.   Labs are pending

## 2022-04-15 ENCOUNTER — Encounter: Payer: Medicare Other | Admitting: Pharmacist

## 2022-04-15 ENCOUNTER — Telehealth: Payer: Self-pay | Admitting: Pharmacist

## 2022-04-15 NOTE — Progress Notes (Unsigned)
Care Management & Coordination Services Pharmacy Note  04/15/2022 Name:  Alexandria Leach MRN:  [id number] DOB:  09-09-1950  Summary: ***  Recommendations/Changes made from today's visit: ***  Follow up plan: -Health Concierge will call patient *** -Pharmacist follow up televisit scheduled for *** -PCP appt 07/15/22   Subjective: Alexandria Leach is an 72 y.o. year old female who is a primary patient of Ria Bush, MD.  The care coordination team was consulted for assistance with disease management and care coordination needs.    Engaged with patient by telephone for follow up visit.  Recent office visits: 02/19/22 Dr Danise Mina OV: viral URI. Restart claritin. Push fluids.  01/11/22 Dr Danise Mina OV: annual - Rx thiamine 100 mg. 2 wk trial off lovastatin for leg pains, consider pravastatin or PCSK9-I.   Recent consult visits: 02/18/22 Dr Harrington Challenger (Psych): off meds for anxiety/depression. Remains on temazepam.  12/25/21 Dr Stanford Breed (Cardiology): [place]  - no changes.  Hospital visits: None in previous 6 months   Objective:  Lab Results  Component Value Date   CREATININE 1.27 (H) 04/12/2022   BUN 9 04/12/2022   GFR 39.48 (L) 07/11/2020   EGFR 46 (L) 04/12/2022   GFRNONAA 42 (L) 03/15/2021   GFRAA 51 (L) 11/11/2019   NA 143 04/12/2022   K 4.5 04/12/2022   CALCIUM 10.2 04/12/2022   CO2 23 04/12/2022   GLUCOSE 90 04/12/2022    Lab Results  Component Value Date/Time   HGBA1C 6.0 01/04/2022 07:19 AM   HGBA1C 6.0 (H) 01/12/2021 03:02 PM   GFR 39.48 (L) 07/11/2020 12:29 PM   GFR 43.09 (L) 12/23/2019 07:57 AM   MICROALBUR <0.7 01/04/2022 07:19 AM   MICROALBUR 4.8 (H) 12/23/2019 07:57 AM    Last diabetic Eye exam: No results found for: "HMDIABEYEEXA"  Last diabetic Foot exam: No results found for: "HMDIABFOOTEX"   Lab Results  Component Value Date   CHOL 201 (H) 12/25/2021   HDL 76 12/25/2021   LDLCALC 112 (H) 12/25/2021   LDLDIRECT 77.0 12/09/2016    TRIG 72 12/25/2021   CHOLHDL 2.6 12/25/2021       Latest Ref Rng & Units 04/12/2022    9:43 AM 12/25/2021    9:23 AM 10/11/2021    9:30 AM  Hepatic Function  Total Protein 6.1 - 8.1 g/dL 7.7  7.2  7.0   Albumin 3.9 - 4.9 g/dL  4.4    AST 10 - 35 U/L 19  25  38   ALT 6 - 29 U/L '11  22  28   '$ Alk Phosphatase 44 - 121 IU/L  70    Total Bilirubin 0.2 - 1.2 mg/dL 0.4  0.3  0.4     Lab Results  Component Value Date/Time   TSH 2.00 01/04/2022 07:19 AM   TSH 1.49 01/12/2021 03:02 PM   FREET4 0.85 01/04/2022 07:19 AM   FREET4 1.1 01/12/2021 03:02 PM       Latest Ref Rng & Units 04/12/2022    9:43 AM 10/11/2021    9:30 AM 05/14/2021    9:26 AM  CBC  WBC 3.8 - 10.8 Thousand/uL 4.8  5.2  4.8   Hemoglobin 11.7 - 15.5 g/dL 12.7  12.1  12.4   Hematocrit 35.0 - 45.0 % 38.9  38.1  38.9   Platelets 140 - 400 Thousand/uL 245  283  247     Lab Results  Component Value Date/Time   VD25OH 36.34 01/04/2022 07:19 AM   VD25OH 42.65 12/23/2019 07:57  AM   VITAMINB12 1,163 (H) 01/04/2022 07:19 AM   VITAMINB12 312 01/12/2021 03:02 PM    Clinical ASCVD: No  The 10-year ASCVD risk score (Arnett DK, et al., 2019) is: 13.3%   Values used to calculate the score:     Age: 8 years     Sex: Female     Is Non-Hispanic African American: Yes     Diabetic: No     Tobacco smoker: No     Systolic Blood Pressure: 201 mmHg     Is BP treated: Yes     HDL Cholesterol: 76 mg/dL     Total Cholesterol: 201 mg/dL    CHA2DS2/VAS Stroke Risk Points  Current as of 42 minutes ago     6 >= 2 Points: High Risk  1 - 1.99 Points: Medium Risk  0 Points: Low Risk    Last Change: N/A      Points Metrics  1 Has Congestive Heart Failure:  Yes    Current as of 42 minutes ago  0 Has Vascular Disease:  No    Current as of 42 minutes ago  1 Has Hypertension:  Yes    Current as of 42 minutes ago  1 Age:  30    Current as of 42 minutes ago  0 Has Diabetes:  No    Current as of 42 minutes ago  2 Had Stroke:  No   Had TIA:  Yes  Had Thromboembolism:  Yes    Current as of 42 minutes ago  1 Female:  Yes    Current as of 42 minutes ago        01/11/2022   10:15 AM 11/21/2021   11:43 AM 08/30/2021    9:22 AM  Depression screen PHQ 2/9  Decreased Interest 1 0 0  Down, Depressed, Hopeless 0 0 0  PHQ - 2 Score 1 0 0  Altered sleeping 1    Tired, decreased energy 1    Change in appetite 0    Feeling bad or failure about yourself  0    Trouble concentrating 1    Moving slowly or fidgety/restless 0    Suicidal thoughts 0    PHQ-9 Score 4    Difficult doing work/chores Somewhat difficult         01/11/2022   10:15 AM 12/28/2019    9:58 AM 12/22/2018   10:00 AM  GAD 7 : Generalized Anxiety Score  Nervous, Anxious, on Edge 0 0 0  Control/stop worrying 0 0 1  Worry too much - different things 0 0 1  Trouble relaxing 0 0 1  Restless 1 0 1  Easily annoyed or irritable 0 0 1  Afraid - awful might happen 0 0 0  Total GAD 7 Score 1 0 5  Anxiety Difficulty Not difficult at all        Social History   Tobacco Use  Smoking Status Never   Passive exposure: Past  Smokeless Tobacco Never  Tobacco Comments   Never smoke 08/29/21   BP Readings from Last 3 Encounters:  04/12/22 138/79  02/25/22 (!) 180/80  02/19/22 130/62   Pulse Readings from Last 3 Encounters:  04/12/22 73  02/25/22 74  02/19/22 79   Wt Readings from Last 3 Encounters:  04/12/22 190 lb (86.2 kg)  02/25/22 189 lb 9.6 oz (86 kg)  02/19/22 190 lb (86.2 kg)   BMI Readings from Last 3 Encounters:  04/12/22 35.90 kg/m  02/25/22 34.13 kg/m  02/19/22 34.20 kg/m    Allergies  Allergen Reactions   Cymbalta [Duloxetine Hcl] Other (See Comments)    tachycardia   Statins Nausea Only and Other (See Comments)    Muscle cramps also   Sulfa Antibiotics Nausea And Vomiting    Medications Reviewed Today     Reviewed by Su Monks (Physician Assistant Certified) on 70/35/00 at [id number]  Med List Status: <None>    Medication Order Taking? Sig Documenting Provider Last Dose Status Informant  ALPRAZolam (XANAX) 0.5 MG tablet [id number] Yes Take 1 tablet (0.5 mg total) by mouth 3 (three) times daily as needed for anxiety. Cloria Spring, MD Taking Active   antiseptic oral rinse Charlene Brooke) LIQD [id number] Yes 15 mLs by Mouth Rinse route 2 (two) times daily as needed for dry mouth. [provider] Taking Active Self  cholecalciferol (VITAMIN D) 1000 UNITS tablet [id number] Yes Take 1,000 Units by mouth daily. [provider] Taking Active Self  co-enzyme Q-10 30 MG capsule [id number] Yes Take 30 mg by mouth daily. [provider] Taking Active Self  cyanocobalamin (VITAMIN B12) 1000 MCG tablet [id number] Yes Take 1 tablet (1,000 mcg total) by mouth once a week. Ria Bush, MD Taking Active   diltiazem [facility] CD) 360 MG 24 hr capsule [id number] Yes TAKE 1 CAPSULE BY MOUTH  DAILY Crenshaw, [name], MD Taking Active   docusate sodium (COLACE) 100 MG capsule [id number] Yes Take 100 mg by mouth daily. [provider] Taking Active Self  dofetilide (TIKOSYN) 250 MCG capsule [id number] Yes TAKE 1 CAPSULE(250 MCG) BY MOUTH TWICE DAILY Fenton, Clint R, PA Taking Active   ELIQUIS 5 MG TABS tablet [id number] Yes TAKE 1 TABLET BY MOUTH TWICE  DAILY Sherran Needs, NP Taking Active   ezetimibe (ZETIA) 10 MG tablet [id number] Yes Take 1 tablet (10 mg total) by mouth at bedtime. Ria Bush, MD Taking Active   furosemide (LASIX) 20 MG tablet [id number] Yes Take 3 tablets (60 mg total) by mouth daily. Lelon Perla, MD Taking Active   gabapentin (NEURONTIN) 300 MG capsule [id number] Yes TAKE 2 CAPSULES BY MOUTH IN THE MORNING 1 CAPSULE BY  MOUTH IN THE AFTERNOON AND  2 CAPSULES BY MOUTH AT  BEDTIME Ria Bush, MD Taking Active   loratadine (CLARITIN) 10 MG tablet [id number] Yes Take 1 tablet (10 mg total) by mouth daily. Ria Bush, MD Taking Active   losartan (COZAAR) 50  MG tablet [id number] Yes TAKE 1 TABLET BY MOUTH DAILY Stanford Breed [name], MD Taking Active   lovastatin (MEVACOR) 10 MG tablet [id number] No Take 1 tablet (10 mg total) by mouth every Monday, Wednesday, and Friday. At night  Patient not taking: Reported on 04/12/2022   Ria Bush, MD Not Taking Active   metoprolol tartrate (LOPRESSOR) 50 MG tablet [id number] Yes TAKE 1 AND 1/2 TABLETS BY MOUTH  TWICE DAILY Stanford Breed [name], MD Taking Active   pantoprazole (PROTONIX) 40 MG tablet [id number] Yes Take 40 mg by mouth daily. [provider] Taking Active Self  Polyethyl Glycol-Propyl Glycol (SYSTANE) 0.4-0.3 % GEL ophthalmic gel [id number] Yes Place 1 drop into both eyes 2 (two) times daily.  [provider] Taking Active Self  potassium chloride (KLOR-CON) 10 MEQ tablet [id number] Yes Take 1 tablet (10 mEq total) by mouth daily. Fenton, [place] R, [place] Taking Active   RESTASIS 0.05 % ophthalmic emulsion [id number] Yes INSTILL ONE DROP IN BOTH  EYES TWO TIMES DAILY Ria Bush, MD Taking Active  temazepam (RESTORIL) 30 MG capsule [id number] Yes Take 1 capsule (30 mg total) by mouth at bedtime as needed for sleep. Cloria Spring, MD Taking Active   thiamine (VITAMIN B1) 100 MG tablet [id number] Yes Take 1 tablet (100 mg total) by mouth daily. Ria Bush, MD Taking Active   traMADol Veatrice Bourbon) 50 MG tablet [id number] Yes TAKE 1 TABLET BY MOUTH DAILY AS  NEEDED Ofilia Neas, PA-C Taking Active             SDOH:  (Social Determinants of Health) assessments and interventions performed: {yes/no:20286} SDOH Interventions    Flowsheet Row Chronic Care Management from 04/16/2021 in [facility] at [place] from 12/21/2018 in [facility] at [place] from 12/09/2016 in [place] at [place] from 11/30/2015 in [place] at [place]  SDOH  Interventions      Depression Interventions/Treatment  -- POE4-2 Score <4 Follow-up Not Indicated --  [referral to PCP] --  [pt is being referred to PCP for further evaulation]  Financial Strain Interventions Intervention Not Indicated -- -- --       Medication Assistance: {MEDASSISTANCEINFO:25044}  Medication Access: Within the past 30 days, how often has patient missed a dose of medication? *** Is a pillbox or other method used to improve adherence? {YES/NO:21197} Factors that may affect medication adherence? {CHL DESC; BARRIERS:21522} Are meds synced by current pharmacy? {YES/NO:21197} Are meds delivered by current pharmacy? {YES/NO:21197} Does patient experience delays in picking up medications due to transportation concerns? {YES/NO:21197}  [facility] Reviewed: Is patient disadvantaged to use [facility]?: {YES/NO:21197} Current Rx insurance plan: *** Name and location of Current pharmacy:  [facility] [place], [place] - [address] [place] [address] Phone: [phone] Fax: [place], [address] [place] [place] [address] Phone: [phone] Fax: [phone]  [facility] services reviewed with patient today?: {YES/NO:21197} Patient requests to transfer care to [facility]?: {YES/NO:21197} Reason patient declined to change pharmacies: {US patient preference:27474}  Compliance/Adherence/Medication fill history: Care Gaps: ***  Star-Rating Drugs: ***   Assessment/Plan   Hyperlipidemia: (LDL goal < 70) {US controlled/uncontrolled:25276} - LDL 112 (12/2021) -Hx TIA, rheumatic heart disease -Current treatment: Ezetimibe 10 mg -daily  Lovastatin 10 mg MWF -Medications previously tried: multiple statins  -Educated on Cholesterol goals;  Exercise goal of 150 minutes per  week; -Recommended to continue current medication  Pre-Diabetes (A1c goal <6.5%) -Controlled, A1c 6.0% (12/2021) -Current medications: None -Medications previously tried: none  -Current home glucose readings - none, pt does not routinely monitor -Denies hypoglycemic/hyperglycemic symptoms -Current meal patterns:  reports she is down to 196 lbs from 205 lbs due to cutting back on sweets lately -Current exercise habits: walking daily (at least 1 mile) -Educated on A1c and blood sugar goals; Congratulated on improved A1c. -Counseled to check feet daily and get yearly eye exams -Recommended continue lifestyle management  Heart Failure (Goal: manage symptoms and prevent exacerbations) -Controlled, followed by cardiology. Pt reports symptoms stable. -Last ejection fraction: 65-70% (Date: 05/2017) -HF type: Diastolic -NYHA Class: II (slight limitation of activity) -AHA HF Stage: C (Heart disease and symptoms present) -Current treatment: Furosemide 20 mg - 3 tab daily Metoprolol tartrate 50 mg - 1.5 tab BID Losartan 50 mg daily Potassium 10  meq daily -Medications previously tried: none reported  -Current home BP/HR readings: checks every other day; ranging 126/70- 138/70 -Current dietary habits: limits table salt -She checks weight daily - average 196 lbs.  She denies fluctuations in weight or swelling. She reports some swelling about once/month if she eats salty foods. Takes Lasix 1 tablet PRN for this. Confirms potassium daily due to hypokalemia -Educated on Importance of weighing daily; if you gain more than 3 pounds in one day or 5 pounds in one week, take Lasix. -Recommended to continue current medication  Atrial Fibrillation (Goal: prevent stroke and major bleeding) -{US controlled/uncontrolled:25276} -CHADSVASC: *** -Current treatment: Diltiazem CD 360 mg daily Dofetilide 250 mcg BID Metoprolol tartrate 50 mg - 1.5 tab BID Eliquis 5 mg BID -Medications previously tried:  *** -Home BP and HR readings: ***  -Counseled on {CCMAFIBCOUNSELING:25120} -{CCMPHARMDINTERVENTION:25122}  Chronic Kidney Disease Stage ***  -All medications assessed for renal dosing and appropriateness in chronic kidney disease. -{CCMPHARMDINTERVENTION:25122}   Charlene Brooke, PharmD, BCACP Clinical Pharmacist [facility] at [facility] [phone]

## 2022-04-15 NOTE — Telephone Encounter (Signed)
Care Management & Coordination Services Outreach Note  04/15/2022 Name: Alexandria Leach MRN: [id number] DOB: 06-15-1950  Referred by: Ria Bush, MD  Patient had a phone appointment scheduled with clinical pharmacist today.  An unsuccessful telephone outreach was attempted today. The patient was referred to the pharmacist for assistance with medications, care management and care coordination.   Patient will NOT be penalized in any way for missing a Care Management & Coordination Services appointment. The no-show fee does not apply.  If possible, a message was left to return call to: [phone] or to [facility]  Charlene Brooke, PharmD, BCACP Clinical Pharmacist [facility] at [facility] [phone]

## 2022-04-15 NOTE — Progress Notes (Signed)
Anti-CCP negative.  SPEP and ANA pending

## 2022-04-15 NOTE — Progress Notes (Signed)
Patient has prescriptions for xanax and restoril.   She takes tramadol very sparingly.  UDS did not reveal any tramadol.  Please document when the patient last took tramadol.  UA revealed trace leukocytes but negative for nitrites and bacteria.  If she develops symptoms of a UTI she should follow up with PCP.   Creatinine is elevated-1.27 and GFR is low 46.  Avoid the use of NSAIDs. Rest of CMP WNL.   CBC WNL. ESR remains elevated but has improved.   Ro antibody remains positive.  La antibody negative.  RF negative.  Complements WNL.  Uric acid WNL.  CRP WNL

## 2022-04-16 LAB — DRUG MONITOR, PANEL 5, W/CONF, URINE
Alphahydroxyalprazolam: 1043 ng/mL — ABNORMAL HIGH (ref <25)
Alphahydroxymidazolam: NEGATIVE ng/mL (ref <50)
Alphahydroxytriazolam: NEGATIVE ng/mL (ref <50)
Aminoclonazepam: NEGATIVE ng/mL (ref <25)
Amphetamines: NEGATIVE ng/mL (ref <500)
Barbiturates: NEGATIVE ng/mL (ref <300)
Benzodiazepines: POSITIVE ng/mL — AB (ref <100)
Cocaine Metabolite: NEGATIVE ng/mL (ref <150)
Creatinine: 172.5 mg/dL (ref >=20.0)
Hydroxyethylflurazepam: NEGATIVE ng/mL (ref <50)
Lorazepam: NEGATIVE ng/mL (ref <50)
Marijuana Metabolite: NEGATIVE ng/mL (ref <20)
Methadone Metabolite: NEGATIVE ng/mL (ref <100)
Nordiazepam: NEGATIVE ng/mL (ref <50)
Opiates: NEGATIVE ng/mL (ref <100)
Oxazepam: 3684 ng/mL — ABNORMAL HIGH (ref <50)
Oxidant: NEGATIVE ug/mL (ref <200)
Oxycodone: NEGATIVE ng/mL (ref <100)
Temazepam: >6250 ng/mL — ABNORMAL HIGH (ref <50)
pH: 7.4 (ref 4.5–9.0)

## 2022-04-16 LAB — CBC WITH DIFFERENTIAL/PLATELET
Absolute Monocytes: 418 cells/uL (ref 200–950)
Basophils Absolute: 29 cells/uL (ref 0–200)
Basophils Relative: 0.6 %
Eosinophils Absolute: 58 cells/uL (ref 15–500)
Eosinophils Relative: 1.2 %
HCT: 38.9 % (ref 35.0–45.0)
Hemoglobin: 12.7 g/dL (ref 11.7–15.5)
Lymphs Abs: 1757 cells/uL (ref 850–3900)
MCH: 27.9 pg (ref 27.0–33.0)
MCHC: 32.6 g/dL (ref 32.0–36.0)
MCV: 85.3 fL (ref 80.0–100.0)
MPV: 11.8 fL (ref 7.5–12.5)
Monocytes Relative: 8.7 %
Neutro Abs: 2539 cells/uL (ref 1500–7800)
Neutrophils Relative %: 52.9 %
Platelets: 245 10*3/uL (ref 140–400)
RBC: 4.56 10*6/uL (ref 3.80–5.10)
RDW: 14.2 % (ref 11.0–15.0)
Total Lymphocyte: 36.6 %
WBC: 4.8 10*3/uL (ref 3.8–10.8)

## 2022-04-16 LAB — URINALYSIS, ROUTINE W REFLEX MICROSCOPIC
Bacteria, UA: NONE SEEN /HPF
Bilirubin Urine: NEGATIVE
Glucose, UA: NEGATIVE
Hgb urine dipstick: NEGATIVE
Hyaline Cast: NONE SEEN /LPF
Ketones, ur: NEGATIVE
Nitrite: NEGATIVE
Protein, ur: NEGATIVE
RBC / HPF: NONE SEEN /HPF (ref 0–2)
Specific Gravity, Urine: 1.015 (ref 1.001–1.035)
WBC, UA: NONE SEEN /HPF (ref 0–5)
pH: 7.5 (ref 5.0–8.0)

## 2022-04-16 LAB — DRUG MONITOR, TRAMADOL,QN, URINE
Desmethyltramadol: NEGATIVE ng/mL (ref <100)
Tramadol: NEGATIVE ng/mL (ref <100)

## 2022-04-16 LAB — SJOGRENS SYNDROME-B EXTRACTABLE NUCLEAR ANTIBODY: SSB (La) (ENA) Antibody, IgG: <1 AI

## 2022-04-16 LAB — ANTI-NUCLEAR AB-TITER (ANA TITER)
ANA TITER: 1:80 {titer} — ABNORMAL HIGH
ANA TITER: 1:80 {titer} — ABNORMAL HIGH
ANA Titer 1: 1:320 {titer} — ABNORMAL HIGH

## 2022-04-16 LAB — COMPLETE METABOLIC PANEL WITH GFR
AG Ratio: 1.3 (calc) (ref 1.0–2.5)
ALT: 11 U/L (ref 6–29)
AST: 19 U/L (ref 10–35)
Albumin: 4.3 g/dL (ref 3.6–5.1)
Alkaline phosphatase (APISO): 62 U/L (ref 37–153)
BUN/Creatinine Ratio: 7 (calc) (ref 6–22)
BUN: 9 mg/dL (ref 7–25)
CO2: 23 mmol/L (ref 20–32)
Calcium: 10.2 mg/dL (ref 8.6–10.4)
Chloride: 109 mmol/L (ref 98–110)
Creat: 1.27 mg/dL — ABNORMAL HIGH (ref 0.50–1.05)
Globulin: 3.4 g/dL (calc) (ref 1.9–3.7)
Glucose, Bld: 90 mg/dL (ref 65–99)
Potassium: 4.5 mmol/L (ref 3.5–5.3)
Sodium: 143 mmol/L (ref 135–146)
Total Bilirubin: 0.4 mg/dL (ref 0.2–1.2)
Total Protein: 7.7 g/dL (ref 6.1–8.1)
eGFR: 46 mL/min/{1.73_m2} — ABNORMAL LOW (ref >=60)

## 2022-04-16 LAB — SJOGRENS SYNDROME-A EXTRACTABLE NUCLEAR ANTIBODY: SSA (Ro) (ENA) Antibody, IgG: >8 AI — AB

## 2022-04-16 LAB — DM TEMPLATE

## 2022-04-16 LAB — C-REACTIVE PROTEIN: CRP: 7.9 mg/L (ref <8.0)

## 2022-04-16 LAB — PROTEIN ELECTROPHORESIS, SERUM, WITH REFLEX
Albumin ELP: 4.3 g/dL (ref 3.8–4.8)
Alpha 1: 0.3 g/dL (ref 0.2–0.3)
Alpha 2: 0.8 g/dL (ref 0.5–0.9)
Beta 2: 0.5 g/dL (ref 0.2–0.5)
Beta Globulin: 0.5 g/dL (ref 0.4–0.6)
Gamma Globulin: 1.3 g/dL (ref 0.8–1.7)
Total Protein: 7.6 g/dL (ref 6.1–8.1)

## 2022-04-16 LAB — CYCLIC CITRUL PEPTIDE ANTIBODY, IGG: Cyclic Citrullin Peptide Ab: <16 UNITS

## 2022-04-16 LAB — ANA: Anti Nuclear Antibody (ANA): POSITIVE — AB

## 2022-04-16 LAB — RHEUMATOID FACTOR: Rheumatoid fact SerPl-aCnc: <14 IU/mL (ref <14)

## 2022-04-16 LAB — SEDIMENTATION RATE: Sed Rate: 34 mm/h — ABNORMAL HIGH (ref 0–30)

## 2022-04-16 LAB — MICROSCOPIC MESSAGE

## 2022-04-16 LAB — C3 AND C4
C3 Complement: 147 mg/dL (ref 83–193)
C4 Complement: 57 mg/dL (ref 15–57)

## 2022-04-16 LAB — URIC ACID: Uric Acid, Serum: 5.4 mg/dL (ref 2.5–7.0)

## 2022-04-16 NOTE — Progress Notes (Signed)
SPEP did not reveal any abnormal protein bands.

## 2022-04-16 NOTE — Progress Notes (Signed)
ANA remains positive.

## 2022-04-25 ENCOUNTER — Other Ambulatory Visit: Payer: Self-pay | Admitting: Physician Assistant

## 2022-04-25 ENCOUNTER — Encounter (HOSPITAL_COMMUNITY): Payer: Self-pay | Admitting: *Deleted

## 2022-05-20 ENCOUNTER — Other Ambulatory Visit: Payer: Self-pay | Admitting: Nurse Practitioner

## 2022-05-20 DIAGNOSIS — I4892 Unspecified atrial flutter: Secondary | ICD-10-CM

## 2022-05-21 ENCOUNTER — Other Ambulatory Visit: Payer: Self-pay | Admitting: Physician Assistant

## 2022-05-21 NOTE — Telephone Encounter (Signed)
Next Visit: 10/11/2022  Last Visit: 04/12/2022  UDS:04/12/2022  Narc Agreement: 10/11/2021  Last Fill: 08/14/2021  Okay to refill Tramadol?

## 2022-06-10 ENCOUNTER — Other Ambulatory Visit (HOSPITAL_COMMUNITY): Payer: Self-pay | Admitting: Psychiatry

## 2022-06-20 ENCOUNTER — Telehealth (HOSPITAL_COMMUNITY): Payer: Medicare Other | Admitting: Psychiatry

## 2022-06-24 ENCOUNTER — Encounter (HOSPITAL_COMMUNITY): Payer: Self-pay | Admitting: Psychiatry

## 2022-06-24 ENCOUNTER — Telehealth (INDEPENDENT_AMBULATORY_CARE_PROVIDER_SITE_OTHER): Payer: 59 | Admitting: Psychiatry

## 2022-06-24 DIAGNOSIS — F411 Generalized anxiety disorder: Secondary | ICD-10-CM

## 2022-06-24 MED ORDER — TEMAZEPAM 30 MG PO CAPS: ORAL_CAPSULE | ORAL | 2 refills | Status: DC

## 2022-06-24 MED ORDER — ALPRAZOLAM 0.5 MG PO TABS: 0.5000 mg | ORAL_TABLET | Freq: Three times a day (TID) | ORAL | 2 refills | Status: DC | PRN

## 2022-06-24 NOTE — Progress Notes (Signed)
Virtual Visit via Video Note  I connected with Alexandria Leach on 06/24/22 at 11:00 AM EDT by a video enabled telemedicine application and verified that I am speaking with the correct person using two identifiers.  Location: Patient: home Provider: office   I discussed the limitations of evaluation and management by telemedicine and the availability of in person appointments. The patient expressed understanding and agreed to proceed.     I discussed the assessment and treatment plan with the patient. The patient was provided an opportunity to ask questions and all were answered. The patient agreed with the plan and demonstrated an understanding of the instructions.   The patient was advised to call back or seek an in-person evaluation if the symptoms worsen or if the condition fails to improve as anticipated.  I provided 15 minutes of non-face-to-face time during this encounter.   [name], MD  [facility] MD/PA/NP OP Progress Note  06/24/2022 11:19 AM Alexandria Leach  MRN:  [id number]  Chief Complaint:  Chief Complaint  Patient presents with   Depression   Anxiety   Follow-up   HPI: This patient is a 72 year old separated black female lives alone in [place]. 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 She has 4 sons.   The patient returns for follow-up after 4 months.  She states that her 78 year old son had a heart attack in November followed by open heart surgery.  A couple of weeks later he had a massive stroke.  He lost his ability to walk talk and do many of his basic functions.  He is now in a nursing home in [place] but has regained most of his function although he still struggling with walking and has problems with short-term memory.  She also states that he has a long history of abusing crack cocaine.  She is trying to help him the best that she can and goes to visit him about 3 times a week.   I urged her to take care of herself and not overdo things.  At first when this happened she was very anxious but she is now realizes that she can only do so much to help him.  She is still doing things with her friends.  She denies significant depression.  Xanax continues to help her anxiety and she is sleeping well. Visit Diagnosis:    ICD-10-CM   1. GAD (generalized anxiety disorder)  F41.1       Past Psychiatric History: none  Past Medical History:  Past Medical History:  Diagnosis Date   Ankle fracture, left 02/19/2015   from a fall   Anxiety    Cataract 2014   corrected with surgery   CHF (congestive heart failure)    CKD (chronic kidney disease) stage 3, GFR 30-59 ml/min    saw nephrologist Dr Stephens Shire   DDD (degenerative disc disease), cervical    Depression    Fibromyalgia    Glaucoma    s/p surgery, sees ophtho Q6 mo   History of DVT (deep vein thrombosis) several times latest 2012   receives coumadin while hospitalized   History of kidney stones 2010   History of pulmonary embolism 2001, 2006   completed coumadin courses   History of rheumatic fever x3   HLD (hyperlipidemia)    HTN (hypertension)    Insomnia    Low serum vitamin B12 01/13/2021   Lung nodule 09/22/2012   RLL - 56mm,  stable since 2014. Thought benign.    Osteoarthritis    shoulders and knees, not RA per Dr Corliss Skains, positive ANA, positive Ro   Osteopenia 09/18/2015   DEXA T -1.1 hip, -0.2 spine 08/2015    Personal history of urinary calculi latest 2014   Pneumonia 12/02/2011   PONV (postoperative nausea and vomiting)    Refusal of blood transfusions as patient is Jehovah's Witness    Rheumatic heart disease 1980   s/p mitral valve repair 1980   Sjogren's syndrome    Trimalleolar fracture of left ankle 02/23/2015    Past Surgical History:  Procedure Laterality Date   BREAST BIOPSY Right 2006   benign   CARDIOVERSION N/A 07/18/2020   Procedure: CARDIOVERSION;  Surgeon: Parke Poisson, MD;   Location: [facility] ENDOSCOPY;  Service: Cardiovascular;  Laterality: N/A;   CHOLECYSTECTOMY  11/27/2011   Procedure: LAPAROSCOPIC CHOLECYSTECTOMY WITH INTRAOPERATIVE CHOLANGIOGRAM;  Surgeon: Ernestene Mention, MD;  Location: [facility] OR;  Service: General;  Laterality: N/A;  laparoscopic cholecystectomy with choleangiogram umbilical hernia repair   COLONOSCOPY  07/2014   WNL Madilyn Fireman)   COLONOSCOPY  08/2019   3 TAs, rpt 3 yrs ([place])   dexa  08/2012   normal per patient - no records available   ESOPHAGOGASTRODUODENOSCOPY  08/2019   reactive gastropathy, neg H pylori ([place])   EYE SURGERY Bilateral 2014   cataract removal   MITRAL VALVE REPAIR  1980   open heart   ORIF ANKLE FRACTURE Left 02/26/2015   Procedure: OPEN REDUCTION INTERNAL FIXATION (ORIF) LEFT TRIMALLEOLAR ANKLE FRACTURE;  Surgeon: Tarry Kos, MD;  Location: [facility] OR;  Service: Orthopedics;  Laterality: Left;   TUBAL LIGATION  1980   UMBILICAL HERNIA REPAIR  11/27/2011   Procedure: HERNIA REPAIR UMBILICAL ADULT;  Surgeon: Ernestene Mention, MD;  Location: [facility] OR;  Service: General;  Laterality: N/A;   VAGINAL HYSTERECTOMY  1992   for fibroids -- partial, ovaries remain    Family Psychiatric History: See below  Family History:  Family History  Problem Relation Age of Onset   Cancer Mother        lung (nonsmoker)   CAD Mother        MI in her 23s   ALS Mother    Kidney disease Father    Alcohol abuse Father    Diabetes Father    Lupus Sister        and niece   Diabetes Sister    Stroke Sister    Depression Sister    Lupus Sister    Blindness Sister    Cancer Maternal Uncle        bone   Stroke Maternal Grandmother    Cancer Brother        bone   Diabetes Brother    Heart attack Brother    Healthy Son    Healthy Son    Healthy Son    Healthy Son    Kidney failure Other        on HD   Breast cancer Neg Hx     Social History:  Social History   Socioeconomic History   Marital status: Married    Spouse name:  [name]   Number of children: 4   Years of education: 12   Highest education level: Not on file  Occupational History    Employer: UNEMPLOYED    Comment: Disability  Tobacco Use   Smoking status: Never    Passive exposure: Past   Smokeless tobacco:  Never   Tobacco comments:    Never smoke 08/29/21  Vaping Use   Vaping Use: Never used  Substance and Sexual Activity   Alcohol use: No    Alcohol/week: 0.0 standard drinks of alcohol   Drug use: No   Sexual activity: Not Currently    Birth control/protection: Surgical  Other Topics Concern   Not on file  Social History Narrative   Lives with son, 1 dog   Occupation: unemployed, on disability for fibromyalgia since 2008.   Edu: HS   Religion: Jehova's witness   Activity: volunteers at senior center   Diet: some water, fruits/vegetables daily   No caffeine use   Social Determinants of Health   Financial Resource Strain: Low Risk  (04/18/2021)   Overall Financial Resource Strain (CARDIA)    Difficulty of Paying Living Expenses: Not very hard  Food Insecurity: No Food Insecurity (02/12/2021)   Hunger Vital Sign    Worried About Running Out of Food in the Last Year: Never true    Ran Out of Food in the Last Year: Never true  Transportation Needs: No Transportation Needs (02/12/2021)   PRAPARE - Administrator, Civil Service (Medical): No    Lack of Transportation (Non-Medical): No  Physical Activity: Sufficiently Active (02/12/2021)   Exercise Vital Sign    Days of Exercise per Week: 7 days    Minutes of Exercise per Session: 50 min  Stress: No Stress Concern Present (02/12/2021)   [employer] of Occupational Health - Occupational Stress Questionnaire    Feeling of Stress : Only a little  Social Connections: Moderately Integrated (02/12/2021)   Social Connection and Isolation Panel [NHANES]    Frequency of Communication with Friends and Family: More than three times a week    Frequency of Social Gatherings  with Friends and Family: Never    Attends Religious Services: More than 4 times per year    Active Member of [employer] or Organizations: No    Attends Banker Meetings: Never    Marital Status: Married    Allergies:  Allergies  Allergen Reactions   Cymbalta [Duloxetine Hcl] Other (See Comments)    tachycardia   Statins Nausea Only and Other (See Comments)    Muscle cramps also   Sulfa Antibiotics Nausea And Vomiting    Metabolic Disorder Labs: Lab Results  Component Value Date   HGBA1C 6.0 01/04/2022   MPG 126 01/12/2021   MPG 114 10/13/2015   No results found for: "PROLACTIN" Lab Results  Component Value Date   CHOL 201 (H) 12/25/2021   TRIG 72 12/25/2021   HDL 76 12/25/2021   CHOLHDL 2.6 12/25/2021   VLDL 16.1 12/23/2019   LDLCALC 112 (H) 12/25/2021   LDLCALC 104 (H) 01/12/2021   Lab Results  Component Value Date   TSH 2.00 01/04/2022   TSH 1.49 01/12/2021    Therapeutic Level Labs: No results found for: "LITHIUM" No results found for: "VALPROATE" No results found for: "CBMZ"  Current Medications: Current Outpatient Medications  Medication Sig Dispense Refill   ALPRAZolam (XANAX) 0.5 MG tablet Take 1 tablet (0.5 mg total) by mouth 3 (three) times daily as needed for anxiety. 270 tablet 2   antiseptic oral rinse (BIOTENE) LIQD 15 mLs by Mouth Rinse route 2 (two) times daily as needed for dry mouth.     cholecalciferol (VITAMIN D) 1000 UNITS tablet Take 1,000 Units by mouth daily.     co-enzyme Q-10 30 MG capsule Take 30  mg by mouth daily.     cyanocobalamin (VITAMIN B12) 1000 MCG tablet Take 1 tablet (1,000 mcg total) by mouth once a week.     diltiazem (CARDIZEM CD) 360 MG 24 hr capsule TAKE 1 CAPSULE BY MOUTH DAILY 90 capsule 3   docusate sodium (COLACE) 100 MG capsule Take 100 mg by mouth daily.     dofetilide (TIKOSYN) 250 MCG capsule TAKE 1 CAPSULE(250 MCG) BY MOUTH TWICE DAILY 180 capsule 1   ELIQUIS 5 MG TABS tablet TAKE 1 TABLET BY MOUTH  TWICE  DAILY 200 tablet 2   ezetimibe (ZETIA) 10 MG tablet Take 1 tablet (10 mg total) by mouth at bedtime. 90 tablet 3   furosemide (LASIX) 20 MG tablet Take 3 tablets (60 mg total) by mouth daily. 270 tablet 3   gabapentin (NEURONTIN) 300 MG capsule TAKE 2 CAPSULES BY MOUTH IN THE MORNING 1 CAPSULE BY  MOUTH IN THE AFTERNOON AND  2 CAPSULES BY MOUTH AT  BEDTIME 450 capsule 3   loratadine (CLARITIN) 10 MG tablet Take 1 tablet (10 mg total) by mouth daily. 90 tablet 3   losartan (COZAAR) 50 MG tablet TAKE 1 TABLET BY MOUTH DAILY 100 tablet 3   lovastatin (MEVACOR) 10 MG tablet Take 1 tablet (10 mg total) by mouth every Monday, Wednesday, and Friday. At night (Patient not taking: Reported on 04/12/2022) 40 tablet 3   metoprolol tartrate (LOPRESSOR) 50 MG tablet TAKE 1 AND 1/2 TABLETS BY MOUTH  TWICE DAILY 300 tablet 2   pantoprazole (PROTONIX) 40 MG tablet Take 40 mg by mouth daily.     Polyethyl Glycol-Propyl Glycol (SYSTANE) 0.4-0.3 % GEL ophthalmic gel Place 1 drop into both eyes 2 (two) times daily.      potassium chloride (KLOR-CON) 10 MEQ tablet Take 1 tablet (10 mEq total) by mouth daily. 90 tablet 3   RESTASIS 0.05 % ophthalmic emulsion INSTILL ONE DROP IN BOTH  EYES TWO TIMES DAILY 180 each 0   temazepam (RESTORIL) 30 MG capsule TAKE 1 CAPSULE(30 MG) BY MOUTH AT BEDTIME AS NEEDED FOR SLEEP 90 capsule 2   thiamine (VITAMIN B1) 100 MG tablet Take 1 tablet (100 mg total) by mouth daily.     traMADol (ULTRAM) 50 MG tablet TAKE 1 TABLET BY MOUTH DAILY AS  NEEDED 30 tablet 0   No current facility-administered medications for this visit.     Musculoskeletal: Strength & Muscle Tone: decreased Gait & Station: normal Patient leans: N/A  Psychiatric Specialty Exam: Review of Systems  Musculoskeletal:  Positive for arthralgias, joint swelling and myalgias.  All other systems reviewed and are negative.   There were no vitals taken for this visit.There is no height or weight on file to  calculate BMI.  General Appearance: Casual and Fairly Groomed  Eye Contact:  Good  Speech:  Clear and Coherent  Volume:  Normal  Mood:  Euthymic  Affect:  Congruent  Thought Process:  Goal Directed  Orientation:  Full (Time, Place, and Person)  Thought Content: WDL   Suicidal Thoughts:  No  Homicidal Thoughts:  No  Memory:  Immediate;   Good Recent;   Good Remote;   NA  Judgement:  Good  Insight:  Good  Psychomotor Activity:  Decreased  Concentration:  Concentration: Good and Attention Span: Good  Recall:  Good  Fund of Knowledge: Good  Language: Good  Akathisia:  No  Handed:  Right  AIMS (if indicated): not done  Assets:  Communication Skills Desire for  Improvement Resilience Social Support Talents/Skills  ADL's:  Intact  Cognition: WNL  Sleep:  Good   Screenings: GAD-7    Flowsheet Row Office Visit [facility]11[facility]20[facility] Visit from 12/28/2019 in [facility] Visit from 12[facility]22/2018 in [facility]  Total GAD-7 Score 1 0 5      Mini-Mental    Flowsheet Row Clinical Support from 12/11/2017 in [facility] Clinical Support from 12/09/2016 in [facility] Clinical Support from 11/30/2015 in [facility]  Total Score (max 30 points ) PHQ2-9    Flowsheet Row Office Visit from 01/11/2022 in [facility] Video Visit from 11/21/2021 in [facility] [place] Video Visit from 08/30/2021 in [facility] at [place] Video Visit from 05/22/2021 in [facility] at [place] Video Visit from 03/30/2021 in [facility] at [facility] Total Score 1 0 0 0 1  PHQ-9 Total Score 4 -- -- -- --       Flowsheet Row ED from 02/09/2022 in [facility] at [place] Video Visit from 11/21/2021 in [facility] at [place] Video Visit from 08/30/2021 in [facility] at [place]  C-SSRS RISK CATEGORY No Risk No Risk No Risk        Assessment and Plan: This patient is a 72 year old female with a history of depression anxiety and Sjogren's syndrome as well as as problems sleeping.  She is doing well on her current regimen.  She will continue Xanax 0.5 mg up to 3 times daily for anxiety and Restoril 30 mg at bedtime for sleep.  She will return to see me in 4 months  Collaboration of Care: Collaboration of Care: Primary Care Provider AEB notes are shared with PCP on the epic system  Patient/Guardian was advised Release of Information must be obtained prior to any record release in order to collaborate their care with an outside provider. Patient/Guardian was advised if they have not already done so to contact the registration department to sign all necessary forms in order for [place] to release information regarding their care.   Consent: Patient/Guardian gives verbal consent for treatment and assignment of benefits for services provided during this visit. Patient/Guardian expressed understanding and agreed to proceed.    [name], MD 06/24/2022, 11:19 AM

## 2022-07-15 ENCOUNTER — Ambulatory Visit (INDEPENDENT_AMBULATORY_CARE_PROVIDER_SITE_OTHER): Payer: 59 | Admitting: Family Medicine

## 2022-07-15 ENCOUNTER — Encounter: Payer: Self-pay | Admitting: Family Medicine

## 2022-07-15 VITALS — BP 162/84 | HR 79 | Temp 97.2°F | Ht 61.0 in | Wt 197.4 lb

## 2022-07-15 DIAGNOSIS — I5032 Chronic diastolic (congestive) heart failure: Secondary | ICD-10-CM | POA: Diagnosis not present

## 2022-07-15 DIAGNOSIS — I484 Atypical atrial flutter: Secondary | ICD-10-CM

## 2022-07-15 DIAGNOSIS — I10 Essential (primary) hypertension: Secondary | ICD-10-CM

## 2022-07-15 DIAGNOSIS — E519 Thiamine deficiency, unspecified: Secondary | ICD-10-CM

## 2022-07-15 DIAGNOSIS — M3501 Sicca syndrome with keratoconjunctivitis: Secondary | ICD-10-CM | POA: Diagnosis not present

## 2022-07-15 DIAGNOSIS — N1832 Chronic kidney disease, stage 3b: Secondary | ICD-10-CM | POA: Diagnosis not present

## 2022-07-15 MED ORDER — LOSARTAN POTASSIUM 100 MG PO TABS: 100.0000 mg | ORAL_TABLET | Freq: Every day | ORAL | 1 refills | Status: DC

## 2022-07-15 NOTE — Assessment & Plan Note (Signed)
[facility]reciate [facility]. Continues tikosyn, eliquis, dilt, metoprolol.

## 2022-07-15 NOTE — Patient Instructions (Addendum)
Blood pressure is staying too high - work on stress relieving strategies.  Increase losartan to 100mg  daily - 2 of current dose. New dose will be at pharmacy.  Return in 10 days for lab work.  Keep track of blood pressures at home let us and/or cardiology know sooner if staying consistently >140/90.  Return in 6 months for physical/wellness visit.

## 2022-07-15 NOTE — Assessment & Plan Note (Signed)
Followed by rheumatology. 

## 2022-07-15 NOTE — Assessment & Plan Note (Signed)
Chronic, deteriorated. BP staying too high likely related to increased stressors. Encouraged healthy stress relieving strategies as well as will increase losartan to 100mg  daily, recheck Cr in 10 days (lab visit only). Continue monitoring BP at home, let us and/or cardiology know if consistently >140/90.

## 2022-07-15 NOTE — Assessment & Plan Note (Signed)
Seems euvolemic today.  

## 2022-07-15 NOTE — Assessment & Plan Note (Signed)
Update kidney function on higher ARB dose in 10 days

## 2022-07-15 NOTE — Progress Notes (Addendum)
Ph: [phone]       Fax: [phone]   Patient ID: Alexandria Leach, female    DOB: 31-Jul-1950, 72 y.o.   MRN: [id number]  This visit was conducted in person.  BP (!) 162/84   Pulse 79   Temp (!) 97.2 F (36.2 C) (Temporal)   Ht 5\' 1"  (1.549 m)   Wt 197 lb 6 oz (89.5 kg)   SpO2 97%   BMI 37.29 kg/m   BP Readings from Last 3 Encounters:  07/15/22 (!) 162/84  04/12/22 138/79  02/25/22 (!) 180/80  174/82 on recheck  CC: 6 mo f/u visit  Subjective:   HPI: Alexandria Leach is a 72 y.o. female presenting on 07/15/2022 for Medical Management of Chronic Issues (Here for 6 mo f/u.)   Notes she's been under increased stress. 49yo son had a stroke - now rehab at nursing home. He is also on dialysis MWF. She's been visiting him very regularly.   Atypical atrial flutter followed by cardiology/EP, on Tikosyn, eliquis, dilt and metoprolol 75mg  bid. Known HFpEF, overall euvolemic.   Sjogren's syndrome with keratoconjunctivitis sicca (+ANA, +Ro), osteoarthritis DDD  and fibromyalgia followed by rheumatology. Currently off immunosuppressant medications.   HTN - Compliant with current antihypertensive regimen of diltiazem 360mg  daily, lasix 40-60mg  daily with potassium daily, losartan 50mg  daily, metoprolol 75mg  bid.  She notes lasix is not as effective as it used to be. Does check blood pressures at home: elevated recently 160-180s systolic, related to stress of son's illness. No low blood pressure readings or symptoms of dizziness/syncope. Denies HA, vision changes, CP/tightness, SOB, leg swelling.      Relevant past medical, surgical, family and social history reviewed and updated as indicated. Interim medical history since our last visit reviewed. Allergies and medications reviewed and updated. Outpatient Medications Prior to Visit  Medication Sig Dispense Refill   ALPRAZolam (XANAX) 0.5 MG tablet Take 1 tablet (0.5 mg total) by mouth 3 (three) times daily as needed for  anxiety. 270 tablet 2   antiseptic oral rinse (BIOTENE) LIQD 15 mLs by Mouth Rinse route 2 (two) times daily as needed for dry mouth.     cholecalciferol (VITAMIN D) 1000 UNITS tablet Take 1,000 Units by mouth daily.     co-enzyme Q-10 30 MG capsule Take 30 mg by mouth daily.     cyanocobalamin (VITAMIN B12) 1000 MCG tablet Take 1 tablet (1,000 mcg total) by mouth once a week.     diltiazem (CARDIZEM CD) 360 MG 24 hr capsule TAKE 1 CAPSULE BY MOUTH DAILY 90 capsule 3   docusate sodium (COLACE) 100 MG capsule Take 100 mg by mouth daily.     dofetilide (TIKOSYN) 250 MCG capsule TAKE 1 CAPSULE(250 MCG) BY MOUTH TWICE DAILY 180 capsule 1   ELIQUIS 5 MG TABS tablet TAKE 1 TABLET BY MOUTH TWICE  DAILY 200 tablet 2   ezetimibe (ZETIA) 10 MG tablet Take 1 tablet (10 mg total) by mouth at bedtime. 90 tablet 3   furosemide (LASIX) 20 MG tablet Take 3 tablets (60 mg total) by mouth daily. 270 tablet 3   gabapentin (NEURONTIN) 300 MG capsule TAKE 2 CAPSULES BY MOUTH IN THE MORNING 1 CAPSULE BY  MOUTH IN THE AFTERNOON AND  2 CAPSULES BY MOUTH AT  BEDTIME 450 capsule 3   loratadine (CLARITIN) 10 MG tablet Take 1 tablet (10 mg total) by mouth daily. 90 tablet 3   lovastatin (MEVACOR) 10 MG tablet Take 1 tablet (10  mg total) by mouth every Monday, Wednesday, and Friday. At night 40 tablet 3   metoprolol tartrate (LOPRESSOR) 50 MG tablet TAKE 1 AND 1/2 TABLETS BY MOUTH  TWICE DAILY 300 tablet 2   pantoprazole (PROTONIX) 40 MG tablet Take 40 mg by mouth daily.     Polyethyl Glycol-Propyl Glycol (SYSTANE) 0.4-0.3 % GEL ophthalmic gel Place 1 drop into both eyes 2 (two) times daily.      RESTASIS 0.05 % ophthalmic emulsion INSTILL ONE DROP IN BOTH  EYES TWO TIMES DAILY 180 each 0   temazepam (RESTORIL) 30 MG capsule TAKE 1 CAPSULE(30 MG) BY MOUTH AT BEDTIME AS NEEDED FOR SLEEP 90 capsule 2   thiamine (VITAMIN B1) 100 MG tablet Take 1 tablet (100 mg total) by mouth daily.     traMADol (ULTRAM) 50 MG tablet TAKE 1  TABLET BY MOUTH DAILY AS  NEEDED 30 tablet 0   losartan (COZAAR) 50 MG tablet TAKE 1 TABLET BY MOUTH DAILY 100 tablet 3   potassium chloride (KLOR-CON) 10 MEQ tablet Take 1 tablet (10 mEq total) by mouth daily. 90 tablet 3   No facility-administered medications prior to visit.     Per HPI unless specifically indicated in ROS section below Review of Systems  Objective:  BP (!) 162/84   Pulse 79   Temp (!) 97.2 F (36.2 C) (Temporal)   Ht 5\' 1"  (1.549 m)   Wt 197 lb 6 oz (89.5 kg)   SpO2 97%   BMI 37.29 kg/m   Wt Readings from Last 3 Encounters:  07/15/22 197 lb 6 oz (89.5 kg)  04/12/22 190 lb (86.2 kg)  02/25/22 189 lb 9.6 oz (86 kg)      Physical Exam Vitals and nursing note reviewed.  Constitutional:      Appearance: Normal appearance. She is not ill-appearing.  HENT:     Mouth/Throat:     Comments: Wearing mask Eyes:     Extraocular Movements: Extraocular movements intact.     Pupils: Pupils are equal, round, and reactive to light.     Comments: Mild cataracts present  Cardiovascular:     Rate and Rhythm: Normal rate and regular rhythm.     Pulses: Normal pulses.     Heart sounds: Murmur (2/6 systolic USB) heard.  Pulmonary:     Effort: Pulmonary effort is normal. No respiratory distress.     Breath sounds: Normal breath sounds. No wheezing, rhonchi or rales.  Musculoskeletal:     Right lower leg: No edema.     Left lower leg: No edema.  Skin:    General: Skin is warm and dry.     Findings: No rash.  Neurological:     Mental Status: She is alert.  Psychiatric:        Mood and Affect: Mood normal.        Behavior: Behavior normal.        Assessment & Plan:   Problem List Items Addressed This Visit     Essential hypertension - Primary    Chronic, deteriorated. BP staying too high likely related to increased stressors. Encouraged healthy stress relieving strategies as well as will increase losartan to 100mg  daily, recheck Cr in 10 days (lab visit only).  Continue monitoring BP at home, let us and/or cardiology know if consistently >140/90.       Relevant Medications   losartan (COZAAR) 100 MG tablet   (HFpEF) heart failure with preserved ejection fraction (HCC)    Seems euvolemic today.  Relevant Medications   losartan (COZAAR) 100 MG tablet   Sjogren's syndrome (HCC)    Followed by rheumatology.       CKD (chronic kidney disease) stage 3, GFR 30-59 ml/min (HCC)    Update kidney function on higher ARB dose in 10 days      Relevant Orders   Renal function panel   Atrial flutter (HCC)    [facility]reciate [facility]. Continues tikosyn, eliquis, dilt, metoprolol.       Relevant Medications   losartan (COZAAR) 100 MG tablet   Vitamin B1 deficiency   Relevant Orders   Vitamin B1     Meds ordered this encounter  Medications   losartan (COZAAR) 100 MG tablet    Sig: Take 1 tablet (100 mg total) by mouth daily.    Dispense:  100 tablet    Refill:  1    Note new dose    Orders Placed This Encounter  Procedures   Renal function panel    Standing Status:   Future    Standing Expiration Date:   07/15/2023   Vitamin B1    Standing Status:   Future    Standing Expiration Date:   07/15/2023    Patient Instructions  Blood pressure is staying too high - work on stress relieving strategies.  Increase losartan to 100mg  daily - 2 of current dose. New dose will be at pharmacy.  Return in 10 days for lab work.  Keep track of blood pressures at home let us and/or cardiology know sooner if staying consistently >140/90.  Return in 6 months for physical/wellness visit.   Follow up plan: Return in about 6 months (around 01/14/2023) for annual exam, prior fasting for blood work, medicare wellness visit.  Eustaquio Boyden, MD

## 2022-07-18 ENCOUNTER — Other Ambulatory Visit: Payer: Self-pay | Admitting: Family Medicine

## 2022-07-18 MED ORDER — PANTOPRAZOLE SODIUM 40 MG PO TBEC: 40.0000 mg | DELAYED_RELEASE_TABLET | Freq: Every day | ORAL | 1 refills | Status: DC

## 2022-07-25 ENCOUNTER — Other Ambulatory Visit (INDEPENDENT_AMBULATORY_CARE_PROVIDER_SITE_OTHER): Payer: 59

## 2022-07-25 DIAGNOSIS — E519 Thiamine deficiency, unspecified: Secondary | ICD-10-CM

## 2022-07-25 DIAGNOSIS — N1832 Chronic kidney disease, stage 3b: Secondary | ICD-10-CM | POA: Diagnosis not present

## 2022-07-25 LAB — RENAL FUNCTION PANEL
Albumin: 4 g/dL (ref 3.5–5.2)
BUN: 9 mg/dL (ref 6–23)
CO2: 20 mEq/L (ref 19–32)
Calcium: 9.5 mg/dL (ref 8.4–10.5)
Chloride: 108 mEq/L (ref 96–112)
Creatinine, Ser: 1.3 mg/dL — ABNORMAL HIGH (ref 0.40–1.20)
GFR: 41.81 mL/min — ABNORMAL LOW (ref >60.00)
Glucose, Bld: 149 mg/dL — ABNORMAL HIGH (ref 70–99)
Phosphorus: 2.7 mg/dL (ref 2.3–4.6)
Potassium: 3.7 mEq/L (ref 3.5–5.1)
Sodium: 140 mEq/L (ref 135–145)

## 2022-07-29 LAB — VITAMIN B1: Vitamin B1 (Thiamine): 95 nmol/L — ABNORMAL HIGH (ref 8–30)

## 2022-07-31 ENCOUNTER — Other Ambulatory Visit: Payer: Self-pay | Admitting: Family Medicine

## 2022-07-31 MED ORDER — THIAMINE HCL 100 MG PO TABS: 100.0000 mg | ORAL_TABLET | ORAL | Status: AC

## 2022-08-09 DIAGNOSIS — H2513 Age-related nuclear cataract, bilateral: Secondary | ICD-10-CM | POA: Diagnosis not present

## 2022-08-09 DIAGNOSIS — H52203 Unspecified astigmatism, bilateral: Secondary | ICD-10-CM | POA: Diagnosis not present

## 2022-08-26 ENCOUNTER — Ambulatory Visit (HOSPITAL_COMMUNITY): Payer: 59 | Admitting: Physician Assistant

## 2022-08-28 ENCOUNTER — Ambulatory Visit (HOSPITAL_COMMUNITY)
Admission: RE | Admit: 2022-08-28 | Discharge: 2022-08-28 | Disposition: A | Discharge status: Home / Self Care | Payer: 59 | Source: Ambulatory Visit | Attending: Physician Assistant | Admitting: Physician Assistant

## 2022-08-28 VITALS — BP 156/76 | HR 71 | Ht 61.0 in | Wt 199.8 lb

## 2022-08-28 DIAGNOSIS — Z8679 Personal history of other diseases of the circulatory system: Secondary | ICD-10-CM | POA: Insufficient documentation

## 2022-08-28 DIAGNOSIS — Z79899 Other long term (current) drug therapy: Secondary | ICD-10-CM | POA: Diagnosis not present

## 2022-08-28 DIAGNOSIS — I5030 Unspecified diastolic (congestive) heart failure: Secondary | ICD-10-CM | POA: Insufficient documentation

## 2022-08-28 DIAGNOSIS — I130 Hypertensive heart and chronic kidney disease with heart failure and stage 1 through stage 4 chronic kidney disease, or unspecified chronic kidney disease: Secondary | ICD-10-CM | POA: Diagnosis not present

## 2022-08-28 DIAGNOSIS — N183 Chronic kidney disease, stage 3 unspecified: Secondary | ICD-10-CM | POA: Diagnosis not present

## 2022-08-28 DIAGNOSIS — Z7901 Long term (current) use of anticoagulants: Secondary | ICD-10-CM | POA: Insufficient documentation

## 2022-08-28 DIAGNOSIS — I484 Atypical atrial flutter: Secondary | ICD-10-CM | POA: Diagnosis not present

## 2022-08-28 DIAGNOSIS — D6869 Other thrombophilia: Secondary | ICD-10-CM | POA: Diagnosis not present

## 2022-08-28 DIAGNOSIS — Z5181 Encounter for therapeutic drug level monitoring: Secondary | ICD-10-CM | POA: Diagnosis not present

## 2022-08-28 LAB — BASIC METABOLIC PANEL
Anion gap: 11 (ref 5–15)
BUN: 14 mg/dL (ref 8–23)
CO2: 24 mmol/L (ref 22–32)
Calcium: 9.8 mg/dL (ref 8.9–10.3)
Chloride: 105 mmol/L (ref 98–111)
Creatinine, Ser: 1.44 mg/dL — ABNORMAL HIGH (ref 0.44–1.00)
GFR, Estimated: 39 mL/min — ABNORMAL LOW (ref >60)
Glucose, Bld: 82 mg/dL (ref 70–99)
Potassium: 4.1 mmol/L (ref 3.5–5.1)
Sodium: 140 mmol/L (ref 135–145)

## 2022-08-28 LAB — MAGNESIUM: Magnesium: 2 mg/dL (ref 1.7–2.4)

## 2022-08-28 NOTE — Progress Notes (Signed)
Primary Care Physician: Eustaquio Boyden, MD Referring Physician: Dr. Jens Som Primary EP: Dr Charisse Klinefelter Alexandria Leach is a 72 y.o. female with a h/o new onset typical atrial flutter in April 2022. H/o of rheumatic heart disease with remote repair pf mitral valve in 1980, CHF, CKD.  She was placed on anticoagulation and had a succession CV but ERAfl. She felt much improved while in SR. Patient is s/p dofetilide admission 8/8-8/11/22. She converted with medication and did not require DCCV.   On follow up today, patient reports that she has done well from a cardiac standpoint. However, her son recently had a stroke and is now in a SNF and she has been under considerable stress. She does have some SOB and chest discomfort when she starts to think about her son's health. She does not have any symptoms with activity. She is in SR today.   Today, she denies symptoms of palpitations, orthopnea, PND, lower extremity edema, dizziness, presyncope, syncope, or neurologic sequela. The patient is tolerating medications without difficulties and is otherwise without complaint today.   Past Medical History:  Diagnosis Date   Ankle fracture, left 02/19/2015   from a fall   Anxiety    Cataract 2014   corrected with surgery   CHF (congestive heart failure) (HCC)    CKD (chronic kidney disease) stage 3, GFR 30-59 ml/min [facility])    saw nephrologist Dr Stephens Shire   COVID-19 09/21/2021   DDD (degenerative disc disease), cervical    Depression    Fibromyalgia    Glaucoma    s/p surgery, sees ophtho Q6 mo   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 Thought  benign.    Osteoarthritis    shoulders and knees, not RA per Dr Corliss Skains, positive ANA, positive Ro   Osteopenia 09/18/2015   DEXA T -1.1 hip, -0.2 spine 08/2015    Personal history of urinary calculi latest 2014   Pneumonia 12/02/2011   PONV (postoperative nausea and vomiting)    Refusal of blood transfusions as patient is Jehovah's Witness    Rheumatic heart disease 1980   s/p mitral valve repair 1980   Sjogren's syndrome (HCC)    Trimalleolar fracture of left ankle 02/23/2015   Past Surgical History:  Procedure Laterality Date   BREAST BIOPSY Right 2006   benign   CARDIOVERSION N/A 07/18/2020   Procedure: CARDIOVERSION;  Surgeon: Parke Poisson, MD;  Location: [facility] ENDOSCOPY;  Service: Cardiovascular;  Laterality: N/A;   CHOLECYSTECTOMY  11/27/2011   Procedure: LAPAROSCOPIC CHOLECYSTECTOMY WITH INTRAOPERATIVE CHOLANGIOGRAM;  Surgeon: Ernestene Mention, MD;  Location: [facility] OR;  Service: General;  Laterality: N/A;  laparoscopic cholecystectomy with choleangiogram umbilical hernia repair   COLONOSCOPY  07/2014   WNL Madilyn Fireman)   COLONOSCOPY  08/2019   3 TAs, rpt 3 yrs ([place])   dexa  08/2012   normal per patient - no records available   ESOPHAGOGASTRODUODENOSCOPY  08/2019   reactive gastropathy, neg H pylori ([place])   EYE SURGERY Bilateral 2014   cataract removal   MITRAL VALVE REPAIR  1980   open  heart   ORIF ANKLE FRACTURE Left 02/26/2015   Procedure: OPEN REDUCTION INTERNAL FIXATION (ORIF) LEFT TRIMALLEOLAR ANKLE FRACTURE;  Surgeon: Tarry Kos, MD;  Location: [facility] OR;  Service: Orthopedics;  Laterality: Left;   TUBAL LIGATION  1980   UMBILICAL HERNIA REPAIR  11/27/2011   Procedure: HERNIA REPAIR UMBILICAL ADULT;  Surgeon: Ernestene Mention, MD;  Location: [facility] OR;  Service: General;  Laterality: N/A;   VAGINAL HYSTERECTOMY  1992   for fibroids -- partial, ovaries remain    Current Outpatient Medications  Medication Sig Dispense Refill   ALPRAZolam (XANAX) 0.5 MG tablet  Take 1 tablet (0.5 mg total) by mouth 3 (three) times daily as needed for anxiety. 270 tablet 2   antiseptic oral rinse (BIOTENE) LIQD 15 mLs by Mouth Rinse route 2 (two) times daily as needed for dry mouth.     cholecalciferol (VITAMIN D) 1000 UNITS tablet Take 1,000 Units by mouth daily.     co-enzyme Q-10 30 MG capsule Take 30 mg by mouth daily.     cyanocobalamin (VITAMIN B12) 1000 MCG tablet Take 1 tablet (1,000 mcg total) by mouth once a week.     diltiazem (CARDIZEM CD) 360 MG 24 hr capsule TAKE 1 CAPSULE BY MOUTH DAILY 90 capsule 3   docusate sodium (COLACE) 100 MG capsule Take 100 mg by mouth daily.     dofetilide (TIKOSYN) 250 MCG capsule TAKE 1 CAPSULE(250 MCG) BY MOUTH TWICE DAILY 180 capsule 1   ELIQUIS 5 MG TABS tablet TAKE 1 TABLET BY MOUTH TWICE  DAILY 200 tablet 2   ezetimibe (ZETIA) 10 MG tablet Take 1 tablet (10 mg total) by mouth at bedtime. 90 tablet 3   furosemide (LASIX) 20 MG tablet Take 3 tablets (60 mg total) by mouth daily. 270 tablet 3   gabapentin (NEURONTIN) 300 MG capsule TAKE 2 CAPSULES BY MOUTH IN THE MORNING 1 CAPSULE BY  MOUTH IN THE AFTERNOON AND  2 CAPSULES BY MOUTH AT  BEDTIME 450 capsule 3   loratadine (CLARITIN) 10 MG tablet Take 1 tablet (10 mg total) by mouth daily. 90 tablet 3   losartan (COZAAR) 100 MG tablet Take 1 tablet (100 mg total) by mouth daily. 100 tablet 1   metoprolol tartrate (LOPRESSOR) 50 MG tablet TAKE 1 AND 1/2 TABLETS BY MOUTH  TWICE DAILY 300 tablet 2   pantoprazole (PROTONIX) 40 MG tablet Take 1 tablet (40 mg total) by mouth daily. 90 tablet 1   Polyethyl Glycol-Propyl Glycol (SYSTANE) 0.4-0.3 % GEL ophthalmic gel Place 1 drop into both eyes 2 (two) times daily.      potassium chloride (KLOR-CON) 10 MEQ tablet Take 1 tablet (10 mEq total) by mouth daily. 90 tablet 3   RESTASIS 0.05 % ophthalmic emulsion INSTILL ONE DROP IN BOTH  EYES TWO TIMES DAILY 180 each 0   temazepam (RESTORIL) 30 MG capsule TAKE 1 CAPSULE(30 MG) BY MOUTH AT  BEDTIME AS NEEDED FOR SLEEP 90 capsule 2   thiamine (VITAMIN B1) 100 MG tablet Take 1 tablet (100 mg total) by mouth once a week.     traMADol (ULTRAM) 50 MG tablet TAKE 1 TABLET BY MOUTH DAILY AS  NEEDED 30 tablet 0   No current facility-administered medications for this encounter.    Allergies  Allergen Reactions   Cymbalta [Duloxetine Hcl] Other (See Comments)    tachycardia   Statins Nausea Only and Other (See Comments)    Muscle cramps also   Sulfa Antibiotics Nausea And Vomiting  Social History   Socioeconomic History   Marital status: Married    Spouse name: [name]   Number of children: 4   Years of education: 12   Highest education level: 12th grade  Occupational History    Employer: UNEMPLOYED    Comment: Disability  Tobacco Use   Smoking status: Never    Passive exposure: Past   Smokeless tobacco: Never   Tobacco comments:    Never smoke 08/29/21  Vaping Use   Vaping Use: Never used  Substance and Sexual Activity   Alcohol use: No    Alcohol/week: 0.0 standard drinks of alcohol   Drug use: No   Sexual activity: Not Currently    Birth control/protection: Surgical  Other Topics Concern   Not on file  Social History Narrative   Lives with son, 1 dog   Occupation: unemployed, on disability for fibromyalgia since 2008.   Edu: HS   Religion: Jehova's witness   Activity: volunteers at senior center   Diet: some water, fruits/vegetables daily   No caffeine use   Social Determinants of Health   Financial Resource Strain: Low Risk  (07/11/2022)   Overall Financial Resource Strain (CARDIA)    Difficulty of Paying Living Expenses: Not hard at all  Food Insecurity: No Food Insecurity (07/11/2022)   Hunger Vital Sign    Worried About Running Out of Food in the Last Year: Never true    Ran Out of Food in the Last Year: Never true  Transportation Needs: Unmet Transportation Needs (07/11/2022)   PRAPARE - Transportation    Lack of Transportation (Medical): No     Lack of Transportation (Non-Medical): Yes  Physical Activity: Insufficiently Active (07/11/2022)   Exercise Vital Sign    Days of Exercise per Week: 3 days    Minutes of Exercise per Session: 10 min  Stress: No Stress Concern Present (07/11/2022)   [employer] of Occupational Health - Occupational Stress Questionnaire    Feeling of Stress : Only a little  Social Connections: Socially Integrated (07/11/2022)   Social Connection and Isolation Panel [NHANES]    Frequency of Communication with Friends and Family: Twice a week    Frequency of Social Gatherings with Friends and Family: Twice a week    Attends Religious Services: More than 4 times per year    Active Member of [employer] or Organizations: Yes    Attends Engineer, structural: More than 4 times per year    Marital Status: Married  Catering manager Violence: Not At Risk (02/12/2021)   Humiliation, Afraid, Rape, and Kick questionnaire    Fear of Current or Ex-Partner: No    Emotionally Abused: No    Physically Abused: No    Sexually Abused: No    Family History  Problem Relation Age of Onset   Cancer Mother        lung (nonsmoker)   CAD Mother        MI in her 44s   ALS Mother    Kidney disease Father    Alcohol abuse Father    Diabetes Father    Lupus Sister        and niece   Diabetes Sister    Stroke Sister    Depression Sister    Lupus Sister    Blindness Sister    Cancer Maternal Uncle        bone   Stroke Maternal Grandmother    Cancer Brother        bone  Diabetes Brother    Heart attack Brother    Healthy Son    Healthy Son    Healthy Son    Healthy Son    Kidney failure Other        on HD   Breast cancer Neg Hx     ROS- All systems are reviewed and negative except as per the HPI above  Physical Exam: Vitals:   08/28/22 0842  BP: (!) 156/76  Pulse: 71  Weight: 90.6 kg  Height: 5\' 1"  (1.549 m)     Wt Readings from Last 3 Encounters:  08/28/22 90.6 kg  07/15/22 89.5 kg   04/12/22 86.2 kg   GEN- The patient is a well appearing female, alert and oriented x 3 today.   HEENT-head normocephalic, atraumatic, sclera clear, conjunctiva pink, hearing intact, trachea midline. Lungs- Clear to ausculation bilaterally, normal work of breathing Heart- Regular rate and rhythm, no murmurs, rubs or gallops  GI- soft, NT, ND, + BS Extremities- no clubbing, cyanosis, or edema MS- no significant deformity or atrophy Skin- no rash or lesion Psych- euthymic mood, full affect Neuro- strength and sensation are intact    EKG today demonstrates  SR, NST Vent. rate 71 BPM PR interval 196 ms QRS duration 82 ms QT/QTcB 406/441 ms   CHA2DS2-VASc Score = 4  The patient's score is based upon: CHF History: 1 HTN History: 1 Diabetes History: 0 Stroke History: 0 Vascular Disease History: 0 Age Score: 1 Gender Score: 1       ASSESSMENT AND PLAN: 1. Atypical atrial flutter The patient's CHA2DS2-VASc score is 4, indicating a 4.8% annual risk of stroke.   S/p dofetilide admission 8/8-8/11/22 Patient appears to be maintaining SR.  Continue dofetilide 250 mcg BID. QT stable. Check bmet/mag today.  Continue Eliquis 5 mg BID Continue diltiazem 360 mg daily Continue Lopressor 75 mg BID  2. Secondary Hypercoagulable State (ICD10:  D68.69) The patient is at significant risk for stroke/thromboembolism based upon her CHA2DS2-VASc Score of 4.  Continue Apixaban (Eliquis).   3. HFpEF  Appears euvolemic today.  4. HTN Mildly elevated today, losartan recently increased.  Continue to monitor.    Follow up in the AF clinic in 6 months. She is also due for her annual visit with Dr Jens Som in October.    Jorja Loa PA-C [facility] [address] [place], [place] [address] [phone]

## 2022-09-16 ENCOUNTER — Other Ambulatory Visit (HOSPITAL_COMMUNITY): Payer: Self-pay | Admitting: Psychiatry

## 2022-09-17 ENCOUNTER — Telehealth (HOSPITAL_COMMUNITY): Payer: Self-pay

## 2022-09-17 NOTE — Telephone Encounter (Signed)
Pt called in requesting a refill on her temazepam (RESTORIL) 30 MG capsule which was sent to the pharmacy 06/24/22 as a 90 day supply with 2 refills. Pt states that she only has like a week and 3 pills left. Advised pt to contact pharmacy and speak with someone to see about getting it refill. Pt verbalized understanding

## 2022-09-21 ENCOUNTER — Other Ambulatory Visit: Payer: Self-pay | Admitting: Cardiology

## 2022-09-21 ENCOUNTER — Other Ambulatory Visit: Payer: Self-pay | Admitting: Family Medicine

## 2022-09-21 DIAGNOSIS — I5032 Chronic diastolic (congestive) heart failure: Secondary | ICD-10-CM

## 2022-09-21 DIAGNOSIS — K219 Gastro-esophageal reflux disease without esophagitis: Secondary | ICD-10-CM

## 2022-09-27 NOTE — Progress Notes (Signed)
Office Visit Note  Patient: Alexandria Leach             Date of Birth: [birth year]           MRN: [id number]             PCP: Eustaquio Boyden, MD Referring: Eustaquio Boyden, MD Visit Date: 10/11/2022 Occupation: @GUAROCC @  Subjective:  Pain in joints  History of Present Illness: Deshea Pooley is a 72 y.o. female with Sjogren's, osteoarthritis and degenerative disc disease.  She states she continues to have dry mouth and dry eyes.  She has been using over-the-counter Systane eyedrops and also Biotene products for the dry mouth.  She denies any history of palpitations or shortness of breath.  She is in constant discomfort due to joint pain.  She describes discomfort in her right trochanteric region.  She has pain and discomfort in her bilateral hands and right ankle.  He takes tramadol 1 tablet p.o. nightly which helps relieve pain at nighttime.    Activities of Daily Living:  Patient reports morning stiffness for all day. Patient Reports nocturnal pain.  Difficulty dressing/grooming: Denies Difficulty climbing stairs: Reports Difficulty getting out of chair: Reports Difficulty using hands for taps, buttons, cutlery, and/or writing: Reports  Review of Systems  Constitutional:  Positive for fatigue.  HENT:  Positive for mouth dryness and nose dryness. Negative for mouth sores.   Eyes:  Positive for dryness.  Respiratory:  Negative for shortness of breath.   Cardiovascular:  Negative for chest pain and palpitations.  Gastrointestinal:  Negative for blood in stool, constipation and diarrhea.  Endocrine: Negative for increased urination.  Genitourinary:  Negative for involuntary urination.  Musculoskeletal:  Positive for joint pain, joint pain, myalgias, muscle weakness, morning stiffness, muscle tenderness and myalgias. Negative for gait problem and joint swelling.  Skin:  Negative for color change, rash, hair loss and sensitivity to sunlight.  Allergic/Immunologic:  Negative for susceptible to infections.  Neurological:  Positive for light-headedness. Negative for dizziness and headaches.  Hematological:  Negative for swollen glands.  Psychiatric/Behavioral:  Positive for sleep disturbance. Negative for depressed mood. The patient is nervous/anxious.     PMFS History:  Patient Active Problem List   Diagnosis Date Noted   Leg pain, bilateral 01/11/2022   Rhinorrhea 10/03/2021   Pruritus 10/03/2021   Pedal edema 08/22/2021   Chronic pain of right knee 07/11/2021   Chronic GERD 01/30/2021   History of adenomatous polyp of colon 01/30/2021   Left sided abdominal pain 01/30/2021   Vitamin B1 deficiency 01/18/2021   Low serum vitamin B12 01/13/2021   Secondary hypercoagulable state (HCC) 10/23/2020   Prediabetes 07/11/2020   Snoring 06/23/2020   Atrial flutter (HCC) 06/23/2020   Dysphagia 05/15/2020   Secondary hyperparathyroidism of renal origin (HCC) 12/28/2019   Constipation 12/28/2019   Sinus tachycardia 05/27/2018   Transient alteration of awareness 04/04/2018   Abnormal serum thyroid stimulating hormone (TSH) level 01/30/2018   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/22/2017   Thyroid nodule 09/16/2017   Neck pain on right side 05/14/2017   Sialoadenitis of submandibular gland 10/29/2016   High risk medication use 06/05/2016   Peripheral neuropathy 01/30/2016   DNR no code (do not resuscitate) 12/08/2015   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   TIA (transient ischemic attack) 10/12/2015   Mild mitral regurgitation 10/12/2015   Osteopenia 09/18/2015   Right arm pain 08/29/2015   History of fall    Right sided sciatica 02/21/2015   Imbalance  01/12/2015   Transaminitis 12/24/2014   DDD (degenerative disc disease), cervical    Health maintenance examination 10/18/2014   Advanced care planning/counseling discussion 10/18/2014   Medicare annual wellness visit, subsequent 10/18/2014   Refusal of blood transfusions as  patient is Jehovah's Witness    Sjogren's syndrome (HCC)    CKD (chronic kidney disease) stage 3, GFR 30-59 ml/min (HCC)    Glaucoma    Fibromyalgia    Osteoarthritis    History of pulmonary embolism    Lung nodule 09/22/2012   (HFpEF) heart failure with preserved ejection fraction (HCC) 12/04/2011   Aortic stenosis 04/22/2011   Palpitations 08/10/2010   HOT FLASHES 06/28/2008   GAD (generalized anxiety disorder) 03/28/2006   History of mitral valve repair 03/28/2006   History of total abdominal hysterectomy 03/28/2006   HYPERCHOLESTEROLEMIA 12/31/2005   MDD (major depressive disorder), recurrent episode (HCC) 12/31/2005   RHEUMATIC HEART DISEASE 12/31/2005   Essential hypertension 12/31/2005   Chronic insomnia 12/31/2005    Past Medical History:  Diagnosis Date   Ankle fracture, left 02/19/2015   from a fall   Anxiety    Cataract 2014   corrected with surgery   CHF (congestive heart failure) (HCC)    CKD (chronic kidney disease) stage 3, GFR 30-59 ml/min [facility])    saw nephrologist Dr Stephens Shire   COVID-19 09/21/2021   DDD (degenerative disc disease), cervical    Depression    Fibromyalgia    Glaucoma    s/p surgery, sees ophtho Q6 mo   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 Thought benign.    Osteoarthritis    shoulders and knees, not RA per Dr Corliss Skains, positive ANA, positive Ro   Osteopenia 09/18/2015   DEXA T -1.1 hip, -0.2 spine 08/2015    Personal history of urinary calculi latest 2014   Pneumonia 12/02/2011   PONV (postoperative nausea and vomiting)    Refusal of blood transfusions as patient is Jehovah's Witness     Rheumatic heart disease 1980   s/p mitral valve repair 1980   Sjogren's syndrome (HCC)    Trimalleolar fracture of left ankle 02/23/2015    Family History  Problem Relation Age of Onset   Cancer Mother        lung (nonsmoker)   CAD Mother        MI in her 62s   ALS Mother    Kidney disease Father    Alcohol abuse Father    Diabetes Father    Lupus Sister        and niece   Diabetes Sister    Stroke Sister    Depression Sister    Lupus Sister    Blindness Sister    Cancer Maternal Uncle        bone   Stroke Maternal Grandmother    Cancer Brother        bone   Diabetes Brother    Heart attack Brother    Healthy Son    Healthy Son    Healthy Son    Healthy Son    Kidney failure  Other        on HD   Breast cancer Neg Hx    Past Surgical History:  Procedure Laterality Date   BREAST BIOPSY Right 2006   benign   CARDIOVERSION N/A 07/18/2020   Procedure: CARDIOVERSION;  Surgeon: Parke Poisson, MD;  [facility] ENDOSCOPY;  Service: Cardiovascular;  Laterality: N/A;   CHOLECYSTECTOMY  11/27/2011   Procedure: LAPAROSCOPIC CHOLECYSTECTOMY WITH INTRAOPERATIVE CHOLANGIOGRAM;  Surgeon: Ernestene Mention, MD;  Location: [facility] OR;  Service: General;  Laterality: N/A;  laparoscopic cholecystectomy with choleangiogram umbilical hernia repair   COLONOSCOPY  07/2014   WNL Madilyn Fireman)   COLONOSCOPY  08/2019   3 TAs, rpt 3 yrs ([place])   dexa  08/2012   normal per patient - no records available   ESOPHAGOGASTRODUODENOSCOPY  08/2019   reactive gastropathy, neg H pylori ([place])   EYE SURGERY Bilateral 2014   cataract removal   MITRAL VALVE REPAIR  1980   open heart   ORIF ANKLE FRACTURE Left 02/26/2015   Procedure: OPEN REDUCTION INTERNAL FIXATION (ORIF) LEFT TRIMALLEOLAR ANKLE FRACTURE;  Surgeon: Tarry Kos, MD;  Location: [facility] OR;  Service: Orthopedics;  Laterality: Left;   TUBAL LIGATION  1980   UMBILICAL HERNIA REPAIR  11/27/2011   Procedure: HERNIA REPAIR UMBILICAL ADULT;   Surgeon: Ernestene Mention, MD;  [facility] OR;  Service: General;  Laterality: N/A;   VAGINAL HYSTERECTOMY  1992   for fibroids -- partial, ovaries remain   Social History   Social History Narrative   Lives with son, 1 dog   Occupation: unemployed, on disability for fibromyalgia since 2008.   Edu: HS   Religion: Jehova's witness   Activity: volunteers at senior center   Diet: some water, fruits/vegetables daily   No caffeine use   Immunization History  Administered Date(s) Administered   COVID-19, mRNA, vaccine(Comirnaty)12 years and older 12/25/2021   PFIZER(Purple Top)SARS-COV-2 Vaccination 05/12/2019, 06/09/2019, 01/06/2020   Pfizer Covid-19 Vaccine Bivalent Booster 4yrs & up 02/13/2021   Pneumococcal Conjugate-13 12/15/2017   Pneumococcal Polysaccharide-23 09/15/2012, 12/22/2018     Objective: Vital Signs: BP 138/79 (BP Location: Left Arm, Patient Position: Sitting, Cuff Size: Normal)   Pulse 67   Resp 15   Ht 5\' 1"  (1.549 m)   Wt 199 lb (90.3 kg)   BMI 37.60 kg/m    Physical Exam Vitals and nursing note reviewed.  Constitutional:      Appearance: She is well-developed.  HENT:     Head: Normocephalic and atraumatic.  Eyes:     Conjunctiva/sclera: Conjunctivae normal.  Cardiovascular:     Rate and Rhythm: Normal rate and regular rhythm.     Heart sounds: Normal heart sounds.  Pulmonary:     Effort: Pulmonary effort is normal.     Breath sounds: Normal breath sounds.  Abdominal:     General: Bowel sounds are normal.     Palpations: Abdomen is soft.  Musculoskeletal:     Cervical back: Normal range of motion.  Lymphadenopathy:     Cervical: No cervical adenopathy.  Skin:    General: Skin is warm and dry.     Capillary Refill: Capillary refill takes less than 2 seconds.  Neurological:     Mental Status: She is alert and oriented to person, place, and time.  Psychiatric:        Behavior: Behavior normal.      Musculoskeletal Exam: Cervical spine was  in good range of motion.  She had no tenderness over  thoracic region.  She had painful range of motion of the lumbar spine.  Shoulders, elbows, wrist joints were in good range of motion.  She had mild PIP and DIP thickening with no synovitis.  Hip joints and knee joints were in good range of motion.  She had previous surgery on her left ankle.  She had no tenderness over ankles or MTPs today.  CDAI Exam: CDAI Score: -- Patient Global: --; Provider Global: -- Swollen: --; Tender: -- Joint Exam 10/11/2022   No joint exam has been documented for this visit   There is currently no information documented on the homunculus. Go to the Rheumatology activity and complete the homunculus joint exam.  Investigation: No additional findings.  Imaging: No results found.  Recent Labs: Lab Results  Component Value Date   WBC 4.8 04/12/2022   HGB 12.7 04/12/2022   PLT 245 04/12/2022   NA 140 08/28/2022   K 4.1 08/28/2022   CL 105 08/28/2022   CO2 24 08/28/2022   GLUCOSE 82 08/28/2022   BUN 14 08/28/2022   CREATININE 1.44 (H) 08/28/2022   BILITOT 0.4 04/12/2022   ALKPHOS 70 12/25/2021   AST 19 04/12/2022   ALT 11 04/12/2022   PROT 7.7 04/12/2022   PROT 7.6 04/12/2022   ALBUMIN 4.0 07/25/2022   CALCIUM 9.8 08/28/2022   GFRAA 51 (L) 11/11/2019    Speciality Comments: PLQ Eye Exam: 08/03/2018 WNL @ [facility] Follow up in 1 year  Procedures:  No procedures performed Allergies: Cymbalta [duloxetine hcl], Statins, and Sulfa antibiotics   Assessment / Plan:     Visit Diagnoses: Sjogren's syndrome with keratoconjunctivitis sicca (HCC) - +ANA, +Ro: She is not currently taking immunosuppressive agents.  She discontinued Plaquenil in February 2021. -She continues to have dry mouth and dry eye symptoms.  She has been using over-the-counter products.  Some other over-the-counter products were discussed.  She denies any shortness of breath.  Increased risk of ILD with Sjogren's was discussed.   There is no history of lymphadenopathy.  Increased risk of lymphoma with Sjogren's was discussed.  Patient continues to have pain and discomfort in multiple joints but no synovitis was noted.  Plan: Protein / creatinine ratio, urine, CBC with Differential/Platelet, COMPLETE METABOLIC PANEL WITH GFR, Anti-DNA antibody, double-stranded, C3 and C4, Sedimentation rate, Sjogrens syndrome-A extractable nuclear antibody  High risk medication use - Discontinued PLQ in Feb 2021.  She is not taking any immunosuppressive agents at this time.  Primary osteoarthritis of both hands-she had bilateral PIP and DIP thickening.  X-rays obtained at the last visit showed osteoarthritic changes.  A handout on hand exercises was given.  Primary osteoarthritis of right knee -she complains of discomfort in her knee joints.  No warmth swelling or effusion was noted.  X-rays from April 2023 which were consistent with osteoarthritis, right knee joint injection in April by Dr. Dallas Schimke.  Pain in right ankle and joints of right foot-she has surgery on her left ankle in the past.  She continues to have some discomfort in her feet and ankles.  No synovitis was noted.  DDD (degenerative disc disease), cervical-she is chronic discomfort in her cervical region.  She had good range of motion.  DDD (degenerative disc disease), lumbar-she had painful right motion of her lumbar spine.  Previous x-rays were reviewed which showed degenerative changes and facet joint arthropathy.  I will refer her to physical therapy.  Osteopenia of multiple sites - Jul 16, 2021 repeat DEXA scan which showed  T score of -1.1 and BMD 0.880 in the left femoral neck.  Calcium rich diet and vitamin D was advised.  Fibromyalgia-she continues to have generalized pain and discomfort from fibromyalgia.  She had positive tender points.  Need for regular exercise was emphasized.  She takes tramadol at bedtime which helps to relieve pain.  Other chronic pain - Tramadol  as needed for pain relief. UDS: We will check UDS today.  Medication monitoring encounter - Plan: DRUG MONITOR, TRAMADOL,QN, URINE, DRUG MONITOR, PANEL 5, W/CONF, URINE  Other fatigue-she continues to have some fatigue.  Need for regular exercise was emphasized.  Fatigue is most like related to insomnia and fibromyalgia.  Other insomnia -she is on Restoril 30 mg at bedtime for insomnia.  History of hypertension-blood pressure was 138/79 today.  Stage 3a chronic kidney disease (HCC)-her creatinine is elevated most likely due to Lasix use.  I advised her to discuss this further with her PCP.  Typical atrial flutter [facility]) - Hospital admission on 10/23/2020 at which time the patient initiated Tikosyn.  History of hypercholesterolemia  History of pulmonary embolism  Chronic anticoagulation - Eliquis 5 mg twice daily for pulmonary embolism.  Thyroid nodule - Thyroid ultrasound obtained on 10/27/19.  History of anxiety  Orders: Orders Placed This Encounter  Procedures   DRUG MONITOR, TRAMADOL,QN, URINE   DRUG MONITOR, PANEL 5, W/CONF, URINE   Protein / creatinine ratio, urine   CBC with Differential/Platelet   COMPLETE METABOLIC PANEL WITH GFR   Anti-DNA antibody, double-stranded   C3 and C4   Sedimentation rate   Sjogrens syndrome-A extractable nuclear antibody   No orders of the defined types were placed in this encounter.    Follow-Up Instructions: Return in about 6 months (around 04/13/2023) for Osteoarthritis.   Pollyann Savoy, MD  Note - This record has been created using Animal nutritionist.  Chart creation errors have been sought, but may not always  have been located. Such creation errors do not reflect on  the standard of medical care.

## 2022-09-30 ENCOUNTER — Other Ambulatory Visit: Payer: Self-pay | Admitting: Family Medicine

## 2022-09-30 DIAGNOSIS — Z0000 Encounter for general adult medical examination without abnormal findings: Secondary | ICD-10-CM

## 2022-10-08 ENCOUNTER — Other Ambulatory Visit: Payer: Self-pay | Admitting: Family Medicine

## 2022-10-08 ENCOUNTER — Telehealth: Payer: Self-pay | Admitting: *Deleted

## 2022-10-08 DIAGNOSIS — I4891 Unspecified atrial fibrillation: Secondary | ICD-10-CM | POA: Diagnosis not present

## 2022-10-08 DIAGNOSIS — G6289 Other specified polyneuropathies: Secondary | ICD-10-CM

## 2022-10-08 DIAGNOSIS — K219 Gastro-esophageal reflux disease without esophagitis: Secondary | ICD-10-CM | POA: Diagnosis not present

## 2022-10-08 NOTE — Telephone Encounter (Signed)
Pt has been set up for tele pre op appt add on due to procedure date and med hold. Med rec and consent are done.

## 2022-10-08 NOTE — Telephone Encounter (Signed)
Pt has been set up for tele pre op appt add on due to procedure date and med hold. Med rec and consent are done.      Patient Consent for Virtual Visit        Alexandria Leach has provided verbal consent on 10/08/2022 for a virtual visit (video or telephone).   CONSENT FOR VIRTUAL VISIT FOR:  Alexandria Leach  By participating in this virtual visit I agree to the following:  I hereby voluntarily request, consent and authorize [facility] and its employed or contracted physicians, physician assistants, nurse practitioners or other licensed health care professionals (the Practitioner), to provide me with telemedicine health care services (the "Services") as deemed necessary by the treating Practitioner. I acknowledge and consent to receive the Services by the Practitioner via telemedicine. I understand that the telemedicine visit will involve communicating with the Practitioner through live audiovisual communication technology and the disclosure of certain medical information by electronic transmission. I acknowledge that I have been given the opportunity to request an in-person assessment or other available alternative prior to the telemedicine visit and am voluntarily participating in the telemedicine visit.  I understand that I have the right to withhold or withdraw my consent to the use of telemedicine in the course of my care at any time, without affecting my right to future care or treatment, and that the Practitioner or I may terminate the telemedicine visit at any time. I understand that I have the right to inspect all information obtained and/or recorded in the course of the telemedicine visit and may receive copies of available information for a reasonable fee.  I understand that some of the potential risks of receiving the Services via telemedicine include:  Delay or interruption in medical evaluation due to technological equipment failure or disruption; Information  transmitted may not be sufficient (e.g. poor resolution of images) to allow for appropriate medical decision making by the Practitioner; and/or  In rare instances, security protocols could fail, causing a breach of personal health information.  Furthermore, I acknowledge that it is my responsibility to provide information about my medical history, conditions and care that is complete and accurate to the best of my ability. I acknowledge that Practitioner's advice, recommendations, and/or decision may be based on factors not within their control, such as incomplete or inaccurate data provided by me or distortions of diagnostic images or specimens that may result from electronic transmissions. I understand that the practice of medicine is not an exact science and that Practitioner makes no warranties or guarantees regarding treatment outcomes. I acknowledge that a copy of this consent can be made available to me via my patient portal [facility] MyChart), or I can request a printed copy by calling the office of [facility].    I understand that my insurance will be billed for this visit.   I have read or had this consent read to me. I understand the contents of this consent, which adequately explains the benefits and risks of the Services being provided via telemedicine.  I have been provided ample opportunity to ask questions regarding this consent and the Services and have had my questions answered to my satisfaction. I give my informed consent for the services to be provided through the use of telemedicine in my medical care

## 2022-10-08 NOTE — Telephone Encounter (Signed)
Patient with diagnosis of aflutter on Eliquis for anticoagulation.    Procedure: colonoscopy Date of procedure: 10/15/22  CHA2DS2-VASc Score = 4  This indicates a 4.8% annual risk of stroke. The patient's score is based upon: CHF History: 1 HTN History: 1 Diabetes History: 0 Stroke History: 0 Vascular Disease History: 0 Age Score: 1 Gender Score: 1   TIA vs migraine 2017 Recurrent DVT/PE, most recently in 2012  CrCl 43mL/min using adjusted body weight due to obesity Platelet count 245K  Per office protocol, patient can hold Eliquis for 1-2 days prior to procedure.    **This guidance is not considered finalized until pre-operative APP has relayed final recommendations.**

## 2022-10-08 NOTE — Telephone Encounter (Signed)
   Pre-operative Risk Assessment    Patient Name: Alexandria Leach  DOB: April 12, 1950 MRN: [id number]      Request for Surgical Clearance    Procedure:   COLONOSCOPY  Date of Surgery:  Clearance 10/15/22                                 Surgeon:  DR. Levora Angel Surgeon's Group or Practice Name:  [facility] Phone number:  [phone] Fax number:  [phone]   Type of Clearance Requested:   - Medical  - Pharmacy:  Hold Apixaban (Eliquis) NOT INDICATED HOW LONG   Type of Anesthesia:   PROPOFOL   Additional requests/questions:    Wilhemina Cash   10/08/2022, 8:53 AM

## 2022-10-08 NOTE — Telephone Encounter (Signed)
Left message on machine for pt to contact the office to schedule tele appt for 7/25 in overbook either 10:40 slot or 3:20 due to procedure is on 7/30 and pt needs to hold Eliquis 1-2 days prior.

## 2022-10-08 NOTE — Telephone Encounter (Signed)
   Name: Alexandria Leach  DOB: 02/22/1951  MRN: [id number]  Primary Cardiologist: Olga Millers, MD   Preoperative team, please contact this patient and set up a phone call appointment for further preoperative risk assessment. Please obtain consent and complete medication review. Thank you for your help.  I confirm that guidance regarding antiplatelet and oral anticoagulation therapy has been completed and, if necessary, noted below.  Per office protocol, patient can hold Eliquis for 1-2 days prior to procedure.  Please resume Eliquis as soon as possible postprocedure, at the discretion of the surgeon.     Joylene Grapes, NP 10/08/2022, 9:33 AM [facility]

## 2022-10-08 NOTE — Telephone Encounter (Signed)
Patient is returning call.  °

## 2022-10-09 NOTE — Progress Notes (Unsigned)
Virtual Visit via Telephone Note   Because of Alexandria Leach's co-morbid illnesses, she is at least at moderate risk for complications without adequate follow up.  This format is felt to be most appropriate for this patient at this time.  The patient did not have access to video technology/had technical difficulties with video requiring transitioning to audio format only (telephone).  All issues noted in this document were discussed and addressed.  No physical exam could be performed with this format.  Please refer to the patient's chart for her consent to telehealth for [facility]  Evaluation Performed:  Preoperative cardiovascular risk assessment _____________   Date:  10/09/2022   Patient ID:  Alexandria Leach, DOB [birth year], MRN [id number] Patient Location:  Home Provider location:   Office  Primary Care Provider:  Eustaquio Boyden, MD Primary Cardiologist:  Olga Millers, MD  Chief Complaint / Patient Profile   72 y.o. y/o female with a h/o hypertension, hyperlipidemia, rheumatic heart disease s/p mitral valve repair 1980, and persistent atrial flutter on chronic anticoagulation and on Tikosyn, chronic HFpEF with most recent echo 12/2021 with normal LV function, indeterminate diastolic parameters, moderate LAE, mitral valve annuloplasty ring present, mild mitral stenosis with mean gradient 7.5 mmHg, mild MR, and mild to moderate aortic insufficiency, mild aortic stenosis with mean gradient 12 mmHg who is pending colonoscopy and presents today for telephonic preoperative cardiovascular risk assessment.  History of Present Illness    Alexandria Leach is a 72 y.o. female who presents via audio/video conferencing for a telehealth visit today.  Pt was last seen in cardiology clinic on 12/25/21 by Dr. Jens Som.  At that time Alexandria Leach was doing well.  The patient is now pending procedure as outlined above. Since her last visit, she She denies chest  pain, palpitations, melena, hematuria, hemoptysis, diaphoresis, syncope, orthopnea, and PND. She reports chronic shortness of breath with moderate to heavy exertion that has been stable for some time. Has chest heaviness only with laying on her right side since starting Tikosyn. She has chronic lower extremity edema that improves with Lasix and leg elevation. Has occasional lightheadedness that she feels is occurring more frequently since PCP increased losartan.  Encouraged her to monitor home BP.  She is able to achieve > 4 METS activity without significant cardiac symptoms.  Past Medical History    Past Medical History:  Diagnosis Date   Ankle fracture, left 02/19/2015   from a fall   Anxiety    Cataract 2014   corrected with surgery   CHF (congestive heart failure) (HCC)    CKD (chronic kidney disease) stage 3, GFR 30-59 ml/min [facility])    saw nephrologist Dr Stephens Shire   COVID-19 09/21/2021   DDD (degenerative disc disease), cervical    Depression    Fibromyalgia    Glaucoma    s/p surgery, sees ophtho Q6 mo   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 Thought benign.    Osteoarthritis    shoulders and knees, not RA per Dr Corliss Skains, positive ANA, positive Ro   Osteopenia 09/18/2015  DEXA T -1.1 hip, -0.2 spine 08/2015    Personal history of urinary calculi latest 2014   Pneumonia 12/02/2011   PONV (postoperative nausea and vomiting)    Refusal of blood transfusions as patient is Jehovah's Witness    Rheumatic heart disease 1980   s/p mitral valve repair 1980   Sjogren's syndrome (HCC)    Trimalleolar fracture of left ankle 02/23/2015    Past Surgical History:  Procedure Laterality Date   BREAST BIOPSY Right 2006   benign   CARDIOVERSION N/A 07/18/2020   Procedure: CARDIOVERSION;  Surgeon: Parke Poisson, MD;  Location: [facility] ENDOSCOPY;  Service: Cardiovascular;  Laterality: N/A;   CHOLECYSTECTOMY  11/27/2011   Procedure: LAPAROSCOPIC CHOLECYSTECTOMY WITH INTRAOPERATIVE CHOLANGIOGRAM;  Surgeon: Ernestene Mention, MD;  Location: [facility] OR;  Service: General;  Laterality: N/A;  laparoscopic cholecystectomy with choleangiogram umbilical hernia repair   COLONOSCOPY  07/2014   WNL Madilyn Fireman)   COLONOSCOPY  08/2019   3 TAs, rpt 3 yrs ([place])   dexa  08/2012   normal per patient - no records available   ESOPHAGOGASTRODUODENOSCOPY  08/2019   reactive gastropathy, neg H pylori ([place])   EYE SURGERY Bilateral 2014   cataract removal   MITRAL VALVE REPAIR  1980   open heart   ORIF ANKLE FRACTURE Left 02/26/2015   Procedure: OPEN REDUCTION INTERNAL FIXATION (ORIF) LEFT TRIMALLEOLAR ANKLE FRACTURE;  Surgeon: Tarry Kos, MD;  Location: [facility] OR;  Service: Orthopedics;  Laterality: Left;   TUBAL LIGATION  1980   UMBILICAL HERNIA REPAIR  11/27/2011   Procedure: HERNIA REPAIR UMBILICAL ADULT;  Surgeon: Ernestene Mention, MD;  Location: [facility] OR;  Service: General;  Laterality: N/A;   VAGINAL HYSTERECTOMY  1992   for fibroids -- partial, ovaries remain    Allergies  Allergies  Allergen Reactions   Cymbalta [Duloxetine Hcl] Other (See Comments)    tachycardia   Statins Nausea Only and Other (See Comments)    Muscle cramps also   Sulfa Antibiotics Nausea And Vomiting    Home Medications    Prior to Admission medications   Medication Sig Start Date End Date Taking? Authorizing Provider  ALPRAZolam Prudy Feeler) 0.5 MG tablet Take 1 tablet (0.5 mg total) by mouth 3 (three) times daily as needed for anxiety. 06/24/22 06/24/23  Myrlene Broker, MD  antiseptic oral rinse (BIOTENE) LIQD 15 mLs by Mouth Rinse route 2 (two) times daily as needed  for dry mouth.    [provider]  cholecalciferol (VITAMIN D) 1000 UNITS tablet Take 1,000 Units by mouth daily.    [provider]  co-enzyme Q-10 30 MG capsule Take 30 mg by mouth daily.    [provider]  cyanocobalamin (VITAMIN B12) 1000 MCG tablet Take 1 tablet (1,000 mcg total) by mouth once a week. 01/11/22   Eustaquio Boyden, MD  diltiazem [facility] CD) 360 MG 24 hr capsule TAKE 1 CAPSULE BY MOUTH DAILY 04/15/22   Lewayne Bunting, MD  docusate sodium (COLACE) 100 MG capsule Take 100 mg by mouth daily.    [provider]  dofetilide (TIKOSYN) 250 MCG capsule TAKE 1 CAPSULE(250 MCG) BY MOUTH TWICE DAILY 04/26/22   Fenton, Clint R, PA  ELIQUIS 5 MG TABS tablet TAKE 1 TABLET BY MOUTH TWICE  DAILY 05/21/22   Newman Nip, NP  ezetimibe (ZETIA) 10 MG tablet Take 1 tablet (10 mg total) by mouth at bedtime. 01/11/22   Eustaquio Boyden, MD  furosemide (LASIX) 20 MG tablet TAKE  3 TABLETS(60 MG) BY MOUTH DAILY 09/24/22   Lewayne Bunting, MD  gabapentin (NEURONTIN) 300 MG capsule TAKE 2 CAPSULES BY MOUTH IN THE MORNING 1 CAPSULE BY  MOUTH IN THE AFTERNOON AND  2 CAPSULES BY MOUTH AT  BEDTIME 01/11/22   Eustaquio Boyden, MD  loratadine (CLARITIN) 10 MG tablet Take 1 tablet (10 mg total) by mouth daily. 02/19/22   Eustaquio Boyden, MD  losartan (COZAAR) 100 MG tablet Take 1 tablet (100 mg total) by mouth daily. 07/15/22   Eustaquio Boyden, MD  metoprolol tartrate (LOPRESSOR) 50 MG tablet TAKE 1 AND 1/2 TABLETS BY MOUTH  TWICE DAILY 03/25/22   Lewayne Bunting, MD  pantoprazole (PROTONIX) 40 MG tablet TAKE 1 TABLET BY MOUTH DAILY 09/23/22   Eustaquio Boyden, MD  Polyethyl Glycol-Propyl Glycol (SYSTANE) 0.4-0.3 % GEL ophthalmic gel Place 1 drop into both eyes 2 (two) times daily.     [provider]  potassium chloride (KLOR-CON) 10 MEQ tablet Take 1 tablet (10 mEq total) by mouth daily. 02/01/22 10/08/22  Fenton, Clint R, PA  RESTASIS 0.05 % ophthalmic  emulsion INSTILL ONE DROP IN BOTH  EYES TWO TIMES DAILY 12/02/16   Eustaquio Boyden, MD  temazepam (RESTORIL) 30 MG capsule TAKE 1 CAPSULE(30 MG) BY MOUTH AT BEDTIME AS NEEDED FOR SLEEP 06/24/22   Myrlene Broker, MD  thiamine (VITAMIN B1) 100 MG tablet Take 1 tablet (100 mg total) by mouth once a week. 07/31/22   Eustaquio Boyden, MD  traMADol Janean Sark) 50 MG tablet TAKE 1 TABLET BY MOUTH DAILY AS  NEEDED 05/21/22   Pollyann Savoy, MD    Physical Exam    Vital Signs:  Alexandria Leach does not have vital signs available for review today.  Given telephonic nature of communication, physical exam is limited. AAOx3. NAD. Normal affect.  Speech and respirations are unlabored.  Accessory Clinical Findings    None  Assessment & Plan    1.  Preoperative Cardiovascular Risk Assessment: According to the Revised Cardiac Risk Index (RCRI), her Perioperative Risk of Major Cardiac Event is (%): 0.9. Her Functional Capacity in METs is: 4.4 according to the Duke Activity Status Index (DASI). The patient is doing well from a cardiac perspective. Therefore, based on ACC/AHA guidelines, the patient would be at acceptable risk for the planned procedure without further cardiovascular testing.   The patient was advised that if she develops new symptoms prior to surgery to contact our office to arrange for a follow-up visit, and she verbalized understanding.  Per office protocol, patient can hold Eliquis for 1-2 days prior to procedure.   A copy of this note will be routed to requesting surgeon.  Time:   Today, I have spent 10 minutes with the patient with telehealth technology discussing medical history, symptoms, and management plan.    Levi Aland, NP-C  10/10/2022, 10:41 AM [address] Office [phone] Fax [phone]

## 2022-10-09 NOTE — Telephone Encounter (Signed)
Gabapentin Last filled:  07/20/22, #450 Last OV:  07/15/22, 6 mo f/u Next OV:  01/14/23, CPE

## 2022-10-10 ENCOUNTER — Encounter: Payer: Self-pay | Admitting: Nurse Practitioner

## 2022-10-10 ENCOUNTER — Ambulatory Visit: Payer: 59 | Attending: Cardiovascular Disease | Admitting: Nurse Practitioner

## 2022-10-10 DIAGNOSIS — Z01810 Encounter for preprocedural cardiovascular examination: Secondary | ICD-10-CM

## 2022-10-10 NOTE — Telephone Encounter (Signed)
ERx 

## 2022-10-11 ENCOUNTER — Ambulatory Visit: Payer: 59 | Attending: Rheumatology | Admitting: Rheumatology

## 2022-10-11 ENCOUNTER — Encounter: Payer: Self-pay | Admitting: Rheumatology

## 2022-10-11 VITALS — BP 138/79 | HR 67 | Resp 15 | Ht 61.0 in | Wt 199.0 lb

## 2022-10-11 DIAGNOSIS — Z7901 Long term (current) use of anticoagulants: Secondary | ICD-10-CM

## 2022-10-11 DIAGNOSIS — Z8679 Personal history of other diseases of the circulatory system: Secondary | ICD-10-CM

## 2022-10-11 DIAGNOSIS — G8929 Other chronic pain: Secondary | ICD-10-CM | POA: Diagnosis not present

## 2022-10-11 DIAGNOSIS — M3501 Sicca syndrome with keratoconjunctivitis: Secondary | ICD-10-CM | POA: Diagnosis not present

## 2022-10-11 DIAGNOSIS — M51369 Other intervertebral disc degeneration, lumbar region without mention of lumbar back pain or lower extremity pain: Secondary | ICD-10-CM

## 2022-10-11 DIAGNOSIS — I483 Typical atrial flutter: Secondary | ICD-10-CM

## 2022-10-11 DIAGNOSIS — M5030 Other cervical disc degeneration, unspecified cervical region: Secondary | ICD-10-CM

## 2022-10-11 DIAGNOSIS — Z86711 Personal history of pulmonary embolism: Secondary | ICD-10-CM

## 2022-10-11 DIAGNOSIS — M5136 Other intervertebral disc degeneration, lumbar region: Secondary | ICD-10-CM

## 2022-10-11 DIAGNOSIS — Z8659 Personal history of other mental and behavioral disorders: Secondary | ICD-10-CM

## 2022-10-11 DIAGNOSIS — Z79899 Other long term (current) drug therapy: Secondary | ICD-10-CM | POA: Diagnosis not present

## 2022-10-11 DIAGNOSIS — M19041 Primary osteoarthritis, right hand: Secondary | ICD-10-CM | POA: Diagnosis not present

## 2022-10-11 DIAGNOSIS — M1711 Unilateral primary osteoarthritis, right knee: Secondary | ICD-10-CM | POA: Diagnosis not present

## 2022-10-11 DIAGNOSIS — Z5181 Encounter for therapeutic drug level monitoring: Secondary | ICD-10-CM

## 2022-10-11 DIAGNOSIS — Z8639 Personal history of other endocrine, nutritional and metabolic disease: Secondary | ICD-10-CM

## 2022-10-11 DIAGNOSIS — N1831 Chronic kidney disease, stage 3a: Secondary | ICD-10-CM

## 2022-10-11 DIAGNOSIS — G4709 Other insomnia: Secondary | ICD-10-CM

## 2022-10-11 DIAGNOSIS — R5383 Other fatigue: Secondary | ICD-10-CM

## 2022-10-11 DIAGNOSIS — M8589 Other specified disorders of bone density and structure, multiple sites: Secondary | ICD-10-CM | POA: Diagnosis not present

## 2022-10-11 DIAGNOSIS — M19042 Primary osteoarthritis, left hand: Secondary | ICD-10-CM

## 2022-10-11 DIAGNOSIS — M797 Fibromyalgia: Secondary | ICD-10-CM | POA: Diagnosis not present

## 2022-10-11 DIAGNOSIS — M25571 Pain in right ankle and joints of right foot: Secondary | ICD-10-CM | POA: Diagnosis not present

## 2022-10-11 DIAGNOSIS — E041 Nontoxic single thyroid nodule: Secondary | ICD-10-CM

## 2022-10-11 DIAGNOSIS — M79641 Pain in right hand: Secondary | ICD-10-CM

## 2022-10-11 LAB — CBC WITH DIFFERENTIAL/PLATELET
Absolute Monocytes: 436 cells/uL (ref 200–950)
Basophils Absolute: 29 cells/uL (ref 0–200)
Basophils Relative: 0.6 %
Eosinophils Absolute: 59 cells/uL (ref 15–500)
Eosinophils Relative: 1.2 %
HCT: 38.6 % (ref 35.0–45.0)
Lymphs Abs: 1490 cells/uL (ref 850–3900)
MCH: 27.5 pg (ref 27.0–33.0)
MCHC: 32.6 g/dL (ref 32.0–36.0)
MCV: 84.1 fL (ref 80.0–100.0)
MPV: 11.6 fL (ref 7.5–12.5)
Monocytes Relative: 8.9 %
Neutro Abs: 2886 cells/uL (ref 1500–7800)
Neutrophils Relative %: 58.9 %
Platelets: 242 10*3/uL (ref 140–400)
RBC: 4.59 10*6/uL (ref 3.80–5.10)
RDW: 13.5 % (ref 11.0–15.0)
Total Lymphocyte: 30.4 %
WBC: 4.9 10*3/uL (ref 3.8–10.8)

## 2022-10-11 LAB — C3 AND C4
C3 Complement: 160 mg/dL (ref 83–193)
C4 Complement: 51 mg/dL (ref 15–57)

## 2022-10-11 LAB — SEDIMENTATION RATE: Sed Rate: 38 mm/h — ABNORMAL HIGH (ref 0–30)

## 2022-10-11 NOTE — Addendum Note (Signed)
Addended by: Geroge Baseman C on: 10/11/2022 10:30 AM   Modules accepted: Orders

## 2022-10-11 NOTE — Patient Instructions (Signed)
Hand Exercises Hand exercises can be helpful for almost anyone. They can strengthen your hands and improve flexibility and movement. The exercises can also increase blood flow to the hands. These results can make your work and daily tasks easier for you. Hand exercises can be especially helpful for people who have joint pain from arthritis or nerve damage from using their hands over and over. These exercises can also help people who injure a hand. Exercises Most of these hand exercises are gentle stretching and motion exercises. It is usually safe to do them often throughout the day. Warming up your hands before exercise may help reduce stiffness. You can do this with gentle massage or by placing your hands in warm water for 10-15 minutes. It is normal to feel some stretching, pulling, tightness, or mild discomfort when you begin new exercises. In time, this will improve. Remember to always be careful and stop right away if you feel sudden, very bad pain or your pain gets worse. You want to get better and be safe. Ask your health care provider which exercises are safe for you. Do exercises exactly as told by your provider and adjust them as told. Do not begin these exercises until told by your provider. Knuckle bend or "claw" fist  Stand or sit with your arm, hand, and all five fingers pointed straight up. Make sure to keep your wrist straight. Gently bend your fingers down toward your palm until the tips of your fingers are touching your palm. Keep your big knuckle straight and only bend the small knuckles in your fingers. Hold this position for 10 seconds. Straighten your fingers back to your starting position. Repeat this exercise 5-10 times with each hand. Full finger fist  Stand or sit with your arm, hand, and all five fingers pointed straight up. Make sure to keep your wrist straight. Gently bend your fingers into your palm until the tips of your fingers are touching the middle of your  palm. Hold this position for 10 seconds. Extend your fingers back to your starting position, stretching every joint fully. Repeat this exercise 5-10 times with each hand. Straight fist  Stand or sit with your arm, hand, and all five fingers pointed straight up. Make sure to keep your wrist straight. Gently bend your fingers at the big knuckle, where your fingers meet your hand, and at the middle knuckle. Keep the knuckle at the tips of your fingers straight and try to touch the bottom of your palm. Hold this position for 10 seconds. Extend your fingers back to your starting position, stretching every joint fully. Repeat this exercise 5-10 times with each hand. Tabletop  Stand or sit with your arm, hand, and all five fingers pointed straight up. Make sure to keep your wrist straight. Gently bend your fingers at the big knuckle, where your fingers meet your hand, as far down as you can. Keep the small knuckles in your fingers straight. Think of forming a tabletop with your fingers. Hold this position for 10 seconds. Extend your fingers back to your starting position, stretching every joint fully. Repeat this exercise 5-10 times with each hand. Finger spread  Place your hand flat on a table with your palm facing down. Make sure your wrist stays straight. Spread your fingers and thumb apart from each other as far as you can until you feel a gentle stretch. Hold this position for 10 seconds. Bring your fingers and thumb tight together again. Hold this position for 10 seconds. Repeat  this exercise 5-10 times with each hand. Making circles  Stand or sit with your arm, hand, and all five fingers pointed straight up. Make sure to keep your wrist straight. Make a circle by touching the tip of your thumb to the tip of your index finger. Hold for 10 seconds. Then open your hand wide. Repeat this motion with your thumb and each of your fingers. Repeat this exercise 5-10 times with each hand. Thumb  motion  Sit with your forearm resting on a table and your wrist straight. Your thumb should be facing up toward the ceiling. Keep your fingers relaxed as you move your thumb. Lift your thumb up as high as you can toward the ceiling. Hold for 10 seconds. Bend your thumb across your palm as far as you can, reaching the tip of your thumb for the small finger (pinkie) side of your palm. Hold for 10 seconds. Repeat this exercise 5-10 times with each hand. Grip strengthening  Hold a stress ball or other soft ball in the middle of your hand. Slowly increase the pressure, squeezing the ball as much as you can without causing pain. Think of bringing the tips of your fingers into the middle of your palm. All of your finger joints should bend when doing this exercise. Hold your squeeze for 10 seconds, then relax. Repeat this exercise 5-10 times with each hand. Contact a health care provider if: Your hand pain or discomfort gets much worse when you do an exercise. Your hand pain or discomfort does not improve within 2 hours after you exercise. If you have either of these problems, stop doing these exercises right away. Do not do them again unless your provider says that you can. Get help right away if: You develop sudden, severe hand pain or swelling. If this happens, stop doing these exercises right away. Do not do them again unless your provider says that you can. This information is not intended to replace advice given to you by your health care provider. Make sure you discuss any questions you have with your health care provider. Document Revised: 03/19/2022 Document Reviewed: 03/19/2022 [employer] Patient Education  2024 [employer] Exercises for Chronic Knee Pain Chronic knee pain is pain that lasts longer than 3 months. For most people with chronic knee pain, exercise and weight loss is an important part of treatment. Your health care provider may want you to focus on: Making the muscles that  support your knee stronger. This can take pressure off your knee and reduce pain. Preventing knee stiffness. How far you can move your knee, keeping it there or making it farther. Losing weight (if this applies) to take pressure off your knee, lower your risk for injury, and make it easier for you to exercise. Your provider will help you make an exercise program that fits your needs and physical abilities. Below are simple, low-impact exercises you can do at home. Ask your provider or physical therapist how often you should do your exercise program and how many times to repeat each exercise. General safety tips  Get your provider's approval before doing any exercises. Start slowly and stop any time you feel pain. Do not exercise if your knee pain is flaring up. Warm up first. Stretching a cold muscle can cause an injury. Do 5-10 minutes of easy movement or light stretching before beginning your exercises. Do 5-10 minutes of low-impact activity (like walking or cycling) before starting strengthening exercises. Contact your provider any time you have pain during  or after exercising. Exercise can cause discomfort but should not be painful. It is normal to be a little stiff or sore after exercising. Stretching and range-of-motion exercises Front thigh stretch  Stand up straight and support your body by holding on to a chair or resting one hand on a wall. With your legs straight and close together, bend one knee to lift your heel up toward your butt. Using one hand for support, grab your ankle with your free hand. Pull your foot up closer toward your butt to feel the stretch in front of your thigh. Hold the stretch for 30 seconds. Repeat __________ times. Complete this exercise __________ times a day. Back thigh stretch  Sit on the floor with your back straight and your legs out straight in front of you. Place the palms of your hands on the floor and slide them toward your feet as you bend at the  hip. Try to touch your nose to your knees and feel the stretch in the back of your thighs. Hold for 30 seconds. Repeat __________ times. Complete this exercise __________ times a day. Calf stretch  Stand facing a wall. Place the palms of your hands flat against the wall, arms extended, and lean slightly against the wall. Get into a lunge position with one leg bent at the knee and the other leg stretched out straight behind you. Keep both feet facing the wall and increase the bend in your knee while keeping the heel of the other leg flat on the ground. You should feel the stretch in your calf. Hold for 30 seconds. Repeat __________ times. Complete this exercise __________ times a day. Strengthening exercises Straight leg lift  Lie on your back with one knee bent and the other leg out straight. Slowly lift the straight leg without bending the knee. Lift until your foot is about 12 inches (30 cm) off the floor. Hold for 3-5 seconds and slowly lower your leg. Repeat __________ times. Complete this exercise __________ times a day. Single leg dip  Stand between two chairs and put both hands on the backs of the chairs for support. Extend one leg out straight with your body weight resting on the heel of the standing leg. Slowly bend your standing knee to dip your body to the level that is comfortable for you. Hold for 3-5 seconds. Repeat __________ times. Complete this exercise __________ times a day. Hamstring curls  Stand straight, knees close together, facing the back of a chair. Hold on to the back of a chair with both hands. Keep one leg straight. Bend the other knee while bringing the heel up toward the butt until the knee is bent at a 90-degree angle (right angle). Hold for 3-5 seconds. Repeat __________ times. Complete this exercise __________ times a day. Wall squat  Stand straight with your back, hips, and head against a wall. Step forward one foot at a time with your back  still against the wall. Your feet should be 2 feet (61 cm) from the wall at shoulder width. Keeping your back, hips, and head against the wall, slide down the wall to as close to a sitting position as you can get. Hold for 5-10 seconds, then slowly slide back up. Repeat __________ times. Complete this exercise __________ times a day. Step-ups  Stand in front of a sturdy platform or stool that is about 6 inches (15 cm) high. Slowly step up with your left / right foot, keeping your knee in line with your hip  and foot. Do not let your knee bend so far that you cannot see your toes. Hold on to a chair for balance, but do not use it for support. Slowly unlock your knee and lower yourself to the starting position. Repeat __________ times. Complete this exercise __________ times a day. Contact a health care provider if: Your exercises cause pain. Your pain is worse after you exercise. Your pain prevents you from doing your exercises. This information is not intended to replace advice given to you by your health care provider. Make sure you discuss any questions you have with your health care provider. Document Revised: 03/19/2022 Document Reviewed: 03/19/2022 [employer] Patient Education  2024 [employer] Low Back Sprain or Strain Rehab Ask your health care provider which exercises are safe for you. Do exercises exactly as told by your health care provider and adjust them as directed. It is normal to feel mild stretching, pulling, tightness, or discomfort as you do these exercises. Stop right away if you feel sudden pain or your pain gets worse. Do not begin these exercises until told by your health care provider. Stretching and range-of-motion exercises These exercises warm up your muscles and joints and improve the movement and flexibility of your back. These exercises also help to relieve pain, numbness, and tingling. Lumbar rotation  Lie on your back on a firm bed or the floor with your knees  bent. Straighten your arms out to your sides so each arm forms a 90-degree angle (right angle) with a side of your body. Slowly move (rotate) both of your knees to one side of your body until you feel a stretch in your lower back (lumbar). Try not to let your shoulders lift off the floor. Hold this position for __________ seconds. Tense your abdominal muscles and slowly move your knees back to the starting position. Repeat this exercise on the other side of your body. Repeat __________ times. Complete this exercise __________ times a day. Single knee to chest  Lie on your back on a firm bed or the floor with both legs straight. Bend one of your knees. Use your hands to move your knee up toward your chest until you feel a gentle stretch in your lower back and buttock. Hold your leg in this position by holding on to the front of your knee. Keep your other leg as straight as possible. Hold this position for __________ seconds. Slowly return to the starting position. Repeat with your other leg. Repeat __________ times. Complete this exercise __________ times a day. Prone extension on elbows  Lie on your abdomen on a firm bed or the floor (prone position). Prop yourself up on your elbows. Use your arms to help lift your chest up until you feel a gentle stretch in your abdomen and your lower back. This will place some of your body weight on your elbows. If this is uncomfortable, try stacking pillows under your chest. Your hips should stay down, against the surface that you are lying on. Keep your hip and back muscles relaxed. Hold this position for __________ seconds. Slowly relax your upper body and return to the starting position. Repeat __________ times. Complete this exercise __________ times a day. Strengthening exercises These exercises build strength and endurance in your back. Endurance is the ability to use your muscles for a long time, even after they get tired. Pelvic tilt This  exercise strengthens the muscles that lie deep in the abdomen. Lie on your back on a firm bed or the  floor with your legs extended. Bend your knees so they are pointing toward the ceiling and your feet are flat on the floor. Tighten your lower abdominal muscles to press your lower back against the floor. This motion will tilt your pelvis so your tailbone points up toward the ceiling instead of pointing to your feet or the floor. To help with this exercise, you may place a small towel under your lower back and try to push your back into the towel. Hold this position for __________ seconds. Let your muscles relax completely before you repeat this exercise. Repeat __________ times. Complete this exercise __________ times a day. Alternating arm and leg raises  Get on your hands and knees on a firm surface. If you are on a hard floor, you may want to use padding, such as an exercise mat, to cushion your knees. Line up your arms and legs. Your hands should be directly below your shoulders, and your knees should be directly below your hips. Lift your left leg behind you. At the same time, raise your right arm and straighten it in front of you. Do not lift your leg higher than your hip. Do not lift your arm higher than your shoulder. Keep your abdominal and back muscles tight. Keep your hips facing the ground. Do not arch your back. Keep your balance carefully, and do not hold your breath. Hold this position for __________ seconds. Slowly return to the starting position. Repeat with your right leg and your left arm. Repeat __________ times. Complete this exercise __________ times a day. Abdominal set with straight leg raise  Lie on your back on a firm bed or the floor. Bend one of your knees and keep your other leg straight. Tense your abdominal muscles and lift your straight leg up, 4-6 inches (10-15 cm) off the ground. Keep your abdominal muscles tight and hold this position for __________  seconds. Do not hold your breath. Do not arch your back. Keep it flat against the ground. Keep your abdominal muscles tense as you slowly lower your leg back to the starting position. Repeat with your other leg. Repeat __________ times. Complete this exercise __________ times a day. Single leg lower with bent knees Lie on your back on a firm bed or the floor. Tense your abdominal muscles and lift your feet off the floor, one foot at a time, so your knees and hips are bent in 90-degree angles (right angles). Your knees should be over your hips and your lower legs should be parallel to the floor. Keeping your abdominal muscles tense and your knee bent, slowly lower one of your legs so your toe touches the ground. Lift your leg back up to return to the starting position. Do not hold your breath. Do not let your back arch. Keep your back flat against the ground. Repeat with your other leg. Repeat __________ times. Complete this exercise __________ times a day. Posture and body mechanics Good posture and healthy body mechanics can help to relieve stress in your body's tissues and joints. Body mechanics refers to the movements and positions of your body while you do your daily activities. Posture is part of body mechanics. Good posture means: Your spine is in its natural S-curve position (neutral). Your shoulders are pulled back slightly. Your head is not tipped forward (neutral). Follow these guidelines to improve your posture and body mechanics in your everyday activities. Standing  When standing, keep your spine neutral and your feet about hip-width apart. Keep a  slight bend in your knees. Your ears, shoulders, and hips should line up. When you do a task in which you stand in one place for a long time, place one foot up on a stable object that is 2-4 inches (5-10 cm) high, such as a footstool. This helps keep your spine neutral. Sitting  When sitting, keep your spine neutral and keep your  feet flat on the floor. Use a footrest, if necessary, and keep your thighs parallel to the floor. Avoid rounding your shoulders, and avoid tilting your head forward. When working at a desk or a computer, keep your desk at a height where your hands are slightly lower than your elbows. Slide your chair under your desk so you are close enough to maintain good posture. When working at a computer, place your monitor at a height where you are looking straight ahead and you do not have to tilt your head forward or downward to look at the screen. Resting When lying down and resting, avoid positions that are most painful for you. If you have pain with activities such as sitting, bending, stooping, or squatting, lie in a position in which your body does not bend very much. For example, avoid curling up on your side with your arms and knees near your chest (fetal position). If you have pain with activities such as standing for a long time or reaching with your arms, lie with your spine in a neutral position and bend your knees slightly. Try the following positions: Lying on your side with a pillow between your knees. Lying on your back with a pillow under your knees. Lifting  When lifting objects, keep your feet at least shoulder-width apart and tighten your abdominal muscles. Bend your knees and hips and keep your spine neutral. It is important to lift using the strength of your legs, not your back. Do not lock your knees straight out. Always ask for help to lift heavy or awkward objects. This information is not intended to replace advice given to you by your health care provider. Make sure you discuss any questions you have with your health care provider. Document Revised: 07/08/2022 Document Reviewed: 05/22/2020 [employer] Patient Education  2024 [employer].

## 2022-10-13 LAB — CBC WITH DIFFERENTIAL/PLATELET: Hemoglobin: 12.6 g/dL (ref 11.7–15.5)

## 2022-10-13 NOTE — Progress Notes (Signed)
CBC normal, CMP shows elevated creatinine, higher than before, most likely due to Lasix use.  If patient is not seeing the nephrologist please refer her to nephrology.  Sedimentation rate is mildly elevated and stable.  SSA antibody positive, double-stranded antibody negative, complements normal, urine protein creatinine ratio normal, UDS consistent with tramadol use.  Please forward results to her PCP.

## 2022-10-14 ENCOUNTER — Other Ambulatory Visit: Payer: Self-pay

## 2022-10-14 DIAGNOSIS — R7989 Other specified abnormal findings of blood chemistry: Secondary | ICD-10-CM

## 2022-10-21 ENCOUNTER — Other Ambulatory Visit: Payer: Self-pay | Admitting: Family Medicine

## 2022-10-23 ENCOUNTER — Telehealth (INDEPENDENT_AMBULATORY_CARE_PROVIDER_SITE_OTHER): Payer: 59 | Admitting: Psychiatry

## 2022-10-23 ENCOUNTER — Encounter: Payer: Self-pay | Admitting: Family Medicine

## 2022-10-23 ENCOUNTER — Encounter (HOSPITAL_COMMUNITY): Payer: Self-pay | Admitting: Psychiatry

## 2022-10-23 DIAGNOSIS — F411 Generalized anxiety disorder: Secondary | ICD-10-CM | POA: Diagnosis not present

## 2022-10-23 DIAGNOSIS — N2581 Secondary hyperparathyroidism of renal origin: Secondary | ICD-10-CM

## 2022-10-23 DIAGNOSIS — M3501 Sicca syndrome with keratoconjunctivitis: Secondary | ICD-10-CM

## 2022-10-23 DIAGNOSIS — N1832 Chronic kidney disease, stage 3b: Secondary | ICD-10-CM

## 2022-10-23 DIAGNOSIS — F331 Major depressive disorder, recurrent, moderate: Secondary | ICD-10-CM

## 2022-10-23 MED ORDER — TEMAZEPAM 30 MG PO CAPS: ORAL_CAPSULE | ORAL | 2 refills | Status: DC

## 2022-10-23 MED ORDER — ALPRAZOLAM 0.5 MG PO TABS: 0.5000 mg | ORAL_TABLET | Freq: Three times a day (TID) | ORAL | 2 refills | Status: DC | PRN

## 2022-10-23 NOTE — Telephone Encounter (Signed)
Spoke with pt asking if she restarted lovastatin. States she does not take med due to it causing leg pain.   Request denied.

## 2022-10-23 NOTE — Progress Notes (Signed)
Virtual Visit via Video Note  I connected with Alexandria Leach on 10/23/22 at  8:40 AM EDT by a video enabled telemedicine application and verified that I am speaking with the correct person using two identifiers.  Location: Patient: home Provider: office   I discussed the limitations of evaluation and management by telemedicine and the availability of in person appointments. The patient expressed understanding and agreed to proceed.     I discussed the assessment and treatment plan with the patient. The patient was provided an opportunity to ask questions and all were answered. The patient agreed with the plan and demonstrated an understanding of the instructions.   The patient was advised to call back or seek an in-person evaluation if the symptoms worsen or if the condition fails to improve as anticipated.  I provided 20 minutes of non-face-to-face time during this encounter.   [name], MD  [facility] MD/PA/NP OP Progress Note  10/23/2022 9:04 AM Alexandria Leach  MRN:  [id number]  Chief Complaint:  Chief Complaint  Patient presents with   Depression   Anxiety   Follow-up   HPI: This patient is a 72 year old separated black female lives alone in [place]. 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 She has 4 sons.  The patient returns for follow-up after 4 months.  She states overall she is doing well.  Last time she told me that her son had a heart attack in November followed by open heart surgery and then followed by a massive stroke.  He is still in a nursing home in [place].  He still has memory issues but is talking but walking is still a big problem.  He has a long history of abusing cocaine.  She is spending a lot of time visiting him and encouraging him.  It sounds as if he has depression as well but no one is at the nursing home is listening to her regarding her concerns about his mental  health.  Despite this she is doing fairly well.  She was found to have elevated creatinine which her rheumatologist thinks is secondary to Lasix.  She is now scheduled to see a nephrologist.  She states overall her mood has been good and she is holding her own.  She denies significant depression.  She is eating and sleeping well.  The Xanax continues to help her anxiety and the temazepam is helping her sleep  Visit Diagnosis:    ICD-10-CM   1. GAD (generalized anxiety disorder)  F41.1     2. Major depressive disorder, recurrent episode, moderate (HCC)  F33.1       Past Psychiatric History: none  Past Medical History:  Past Medical History:  Diagnosis Date   Ankle fracture, left 02/19/2015   from a fall   Anxiety    Cataract 2014   corrected with surgery   CHF (congestive heart failure) (HCC)    CKD (chronic kidney disease) stage 3, GFR 30-59 ml/min [facility])    saw nephrologist Dr Stephens Shire   COVID-19 09/21/2021   DDD (degenerative disc disease), cervical    Depression    Fibromyalgia    Glaucoma    s/p surgery, sees ophtho Q6 mo   History of DVT (deep vein thrombosis) several times latest 2012   receives coumadin while hospitalized   History of kidney stones 2010   History of pulmonary embolism 2001, 2006   completed coumadin courses   History  of rheumatic fever x3   HLD (hyperlipidemia)    HTN (hypertension)    Insomnia    Low serum vitamin B12 01/13/2021   Lung nodule 09/22/2012   RLL - 6mm, stable since 2014. Thought benign.    Osteoarthritis    shoulders and knees, not RA per Dr Corliss Skains, positive ANA, positive Ro   Osteopenia 09/18/2015   DEXA T -1.1 hip, -0.2 spine 08/2015    Personal history of urinary calculi latest 2014   Pneumonia 12/02/2011   PONV (postoperative nausea and vomiting)    Refusal of blood transfusions as patient is Jehovah's Witness    Rheumatic heart disease 1980   s/p mitral valve repair 1980   Sjogren's syndrome (HCC)    Trimalleolar  fracture of left ankle 02/23/2015    Past Surgical History:  Procedure Laterality Date   BREAST BIOPSY Right 2006   benign   CARDIOVERSION N/A 07/18/2020   Procedure: CARDIOVERSION;  Surgeon: Parke Poisson, MD;  Location[facility] ENDOSCOPY;  Service: Cardiovascular;  Laterality: N/A;   CHOLECYSTECTOMY  11/27/2011   Procedure: LAPAROSCOPIC CHOLECYSTECTOMY WITH INTRAOPERATIVE CHOLANGIOGRAM;  Surgeon: Ernestene Mention, MD;  Location: [facility] OR;  Service: General;  Laterality: N/A;  laparoscopic cholecystectomy with choleangiogram umbilical hernia repair   COLONOSCOPY  07/2014   WNL Madilyn Fireman)   COLONOSCOPY  08/2019   3 TAs, rpt 3 yrs ([place])   dexa  08/2012   normal per patient - no records available   ESOPHAGOGASTRODUODENOSCOPY  08/2019   reactive gastropathy, neg H pylori ([place])   EYE SURGERY Bilateral 2014   cataract removal   MITRAL VALVE REPAIR  1980   open heart   ORIF ANKLE FRACTURE Left 02/26/2015   Procedure: OPEN REDUCTION INTERNAL FIXATION (ORIF) LEFT TRIMALLEOLAR ANKLE FRACTURE;  Surgeon: Tarry Kos, MD;  Location: [facility] OR;  Service: Orthopedics;  Laterality: Left;   TUBAL LIGATION  1980   UMBILICAL HERNIA REPAIR  11/27/2011   Procedure: HERNIA REPAIR UMBILICAL ADULT;  Surgeon: Ernestene Mention, MD;  Location: [facility] OR;  Service: General;  Laterality: N/A;   VAGINAL HYSTERECTOMY  1992   for fibroids -- partial, ovaries remain    Family Psychiatric History: See below  Family History:  Family History  Problem Relation Age of Onset   Cancer Mother        lung (nonsmoker)   CAD Mother        MI in her 43s   ALS Mother    Kidney disease Father    Alcohol abuse Father    Diabetes Father    Lupus Sister        and niece   Diabetes Sister    Stroke Sister    Depression Sister    Lupus Sister    Blindness Sister    Cancer Maternal Uncle        bone   Stroke Maternal Grandmother    Cancer Brother        bone   Diabetes Brother    Heart attack Brother    Healthy Son     Healthy Son    Healthy Son    Healthy Son    Kidney failure Other        on HD   Breast cancer Neg Hx     Social History:  Social History   Socioeconomic History   Marital status: Married    Spouse name: [name]   Number of children: 4   Years of education: 12   Highest  education level: 12th grade  Occupational History    Employer: UNEMPLOYED    Comment: Disability  Tobacco Use   Smoking status: Never    Passive exposure: Past   Smokeless tobacco: Never   Tobacco comments:    Never smoke 08/29/21  Vaping Use   Vaping status: Never Used  Substance and Sexual Activity   Alcohol use: No    Alcohol/week: 0.0 standard drinks of alcohol   Drug use: No   Sexual activity: Not Currently    Birth control/protection: Surgical  Other Topics Concern   Not on file  Social History Narrative   Lives with son, 1 dog   Occupation: unemployed, on disability for fibromyalgia since 2008.   Edu: HS   Religion: Jehova's witness   Activity: volunteers at senior center   Diet: some water, fruits/vegetables daily   No caffeine use   Social Determinants of Health   Financial Resource Strain: Low Risk  (07/11/2022)   Overall Financial Resource Strain (CARDIA)    Difficulty of Paying Living Expenses: Not hard at all  Food Insecurity: No Food Insecurity (07/11/2022)   Hunger Vital Sign    Worried About Running Out of Food in the Last Year: Never true    Ran Out of Food in the Last Year: Never true  Transportation Needs: Unmet Transportation Needs (07/11/2022)   PRAPARE - Transportation    Lack of Transportation (Medical): No    Lack of Transportation (Non-Medical): Yes  Physical Activity: Insufficiently Active (07/11/2022)   Exercise Vital Sign    Days of Exercise per Week: 3 days    Minutes of Exercise per Session: 10 min  Stress: No Stress Concern Present (07/11/2022)   [employer] of Occupational Health - Occupational Stress Questionnaire    Feeling of Stress : Only a little   Social Connections: Socially Integrated (07/11/2022)   Social Connection and Isolation Panel [NHANES]    Frequency of Communication with Friends and Family: Twice a week    Frequency of Social Gatherings with Friends and Family: Twice a week    Attends Religious Services: More than 4 times per year    Active Member of [employer] or Organizations: Yes    Attends Engineer, structural: More than 4 times per year    Marital Status: Married    Allergies:  Allergies  Allergen Reactions   Cymbalta [Duloxetine Hcl] Other (See Comments)    tachycardia   Statins Nausea Only and Other (See Comments)    Muscle cramps also   Sulfa Antibiotics Nausea And Vomiting    Metabolic Disorder Labs: Lab Results  Component Value Date   HGBA1C 6.0 01/04/2022   MPG 126 01/12/2021   MPG 114 10/13/2015   No results found for: "PROLACTIN" Lab Results  Component Value Date   CHOL 201 (H) 12/25/2021   TRIG 72 12/25/2021   HDL 76 12/25/2021   CHOLHDL 2.6 12/25/2021   VLDL 09.8 12/23/2019   LDLCALC 112 (H) 12/25/2021   LDLCALC 104 (H) 01/12/2021   Lab Results  Component Value Date   TSH 2.00 01/04/2022   TSH 1.49 01/12/2021    Therapeutic Level Labs: No results found for: "LITHIUM" No results found for: "VALPROATE" No results found for: "CBMZ"  Current Medications: Current Outpatient Medications  Medication Sig Dispense Refill   ALPRAZolam (XANAX) 0.5 MG tablet Take 1 tablet (0.5 mg total) by mouth 3 (three) times daily as needed for anxiety. 270 tablet 2   antiseptic oral rinse (BIOTENE) LIQD 15 mLs  by Mouth Rinse route 2 (two) times daily as needed for dry mouth.     cholecalciferol (VITAMIN D) 1000 UNITS tablet Take 1,000 Units by mouth daily.     co-enzyme Q-10 30 MG capsule Take 30 mg by mouth daily.     cyanocobalamin (VITAMIN B12) 1000 MCG tablet Take 1 tablet (1,000 mcg total) by mouth once a week.     diltiazem (CARDIZEM CD) 360 MG 24 hr capsule TAKE 1 CAPSULE BY MOUTH DAILY 90  capsule 3   docusate sodium (COLACE) 100 MG capsule Take 100 mg by mouth daily.     dofetilide (TIKOSYN) 250 MCG capsule TAKE 1 CAPSULE(250 MCG) BY MOUTH TWICE DAILY 180 capsule 1   ELIQUIS 5 MG TABS tablet TAKE 1 TABLET BY MOUTH TWICE  DAILY 200 tablet 2   ezetimibe (ZETIA) 10 MG tablet Take 1 tablet (10 mg total) by mouth at bedtime. 90 tablet 3   furosemide (LASIX) 20 MG tablet TAKE 3 TABLETS(60 MG) BY MOUTH DAILY 270 tablet 3   gabapentin (NEURONTIN) 300 MG capsule TAKE 2 CAPSULES BY MOUTH IN THE  MORNING 1 CAPSULE BY MOUTH IN  THE AFTERNOON AND 2 CAPSULES BY  MOUTH AT BEDTIME 500 capsule 2   loratadine (CLARITIN) 10 MG tablet Take 1 tablet (10 mg total) by mouth daily. 90 tablet 3   losartan (COZAAR) 100 MG tablet Take 1 tablet (100 mg total) by mouth daily. 100 tablet 1   metoprolol tartrate (LOPRESSOR) 50 MG tablet TAKE 1 AND 1/2 TABLETS BY MOUTH  TWICE DAILY 300 tablet 2   pantoprazole (PROTONIX) 40 MG tablet TAKE 1 TABLET BY MOUTH DAILY 100 tablet 1   Polyethyl Glycol-Propyl Glycol (SYSTANE) 0.4-0.3 % GEL ophthalmic gel Place 1 drop into both eyes 2 (two) times daily.      potassium chloride (KLOR-CON) 10 MEQ tablet Take 1 tablet (10 mEq total) by mouth daily. 90 tablet 3   RESTASIS 0.05 % ophthalmic emulsion INSTILL ONE DROP IN BOTH  EYES TWO TIMES DAILY 180 each 0   temazepam (RESTORIL) 30 MG capsule TAKE 1 CAPSULE(30 MG) BY MOUTH AT BEDTIME AS NEEDED FOR SLEEP 90 capsule 2   thiamine (VITAMIN B1) 100 MG tablet Take 1 tablet (100 mg total) by mouth once a week.     traMADol (ULTRAM) 50 MG tablet TAKE 1 TABLET BY MOUTH DAILY AS  NEEDED 30 tablet 0   No current facility-administered medications for this visit.     Musculoskeletal: Strength & Muscle Tone: within normal limits Gait & Station: normal Patient leans: N/A  Psychiatric Specialty Exam: Review of Systems  Cardiovascular:  Positive for leg swelling.  All other systems reviewed and are negative.   There were no vitals  taken for this visit.There is no height or weight on file to calculate BMI.  General Appearance: Casual and Fairly Groomed  Eye Contact:  Good  Speech:  Clear and Coherent  Volume:  Normal  Mood:  Euthymic  Affect:  Congruent  Thought Process:  Goal Directed  Orientation:  Full (Time, Place, and Person)  Thought Content: WDL   Suicidal Thoughts:  No  Homicidal Thoughts:  No  Memory:  Immediate;   Good Recent;   Good Remote;   Fair  Judgement:  Good  Insight:  Good  Psychomotor Activity:  Decreased  Concentration:  Concentration: Good and Attention Span: Good  Recall:  Good  Fund of Knowledge: Good  Language: Good  Akathisia:  No  Handed:  Right  AIMS (if indicated): not done  Assets:  Communication Skills Desire for Improvement Resilience Social Support Talents/Skills  ADL's:  Intact  Cognition: WNL  Sleep:  Good   Screenings: GAD-7    Flowsheet Row Office Visit from 07/15/2022 in [facility] Visit from 01/11/2022 in [facility] Visit from 12/28/2019 in [facility] Visit from 12/22/2018 in [facility]  Total GAD-7 Score 0 1 0 5      Mini-Mental    Flowsheet Row Clinical Support from 12/11/2017 in [facility] Clinical Support from 12/09/2016 in [facility] Clinical Support from 11/30/2015 in [facility]  Total Score (max 30 points ) 20 19 18       PHQ2-9    Flowsheet Row Office Visit from 07/15/2022 in [facility] Office Visit from 01/11/2022 in [facility] Video Visit from 11/21/2021 in [facility] at [place] Video Visit from 08/30/2021 in [facility] at [place] Video Visit from  05/22/2021 in [facility] at [facility] Total Score 0 1 0 0 0  PHQ-9 Total Score 3 4 -- -- --      Flowsheet Row ED from 02/09/2022 in [facility] at [place] Video Visit from 11/21/2021 in [facility] at [place] Video Visit from 08/30/2021 in [facility]patient [facility] at [place]  C-SSRS RISK CATEGORY No Risk No Risk No Risk        Assessment and Plan: This patient is a 72 year old female with a history of depression and anxiety Sjogren syndrome.  She is doing well on her current regimen and does not feel like she needs to go back on an antidepressant.  She will continue Xanax 0.5 mg up to 3 times daily for anxiety and Restoril 30 mg at bedtime for sleep.  She will return to see me in 4 months  Collaboration of Care: Collaboration of Care: Primary Care Provider AEB notes are shared with PCP on the epic system  Patient/Guardian was advised Release of Information must be obtained prior to any record release in order to collaborate their care with an outside provider. Patient/Guardian was advised if they have not already done so to contact the registration department to sign all necessary forms in order for [place] to release information regarding their care.   Consent: Patient/Guardian gives verbal consent for treatment and assignment of benefits for services provided during this visit. Patient/Guardian expressed understanding and agreed to proceed.    [name], MD 10/23/2022, 9:04 AM

## 2022-10-27 ENCOUNTER — Other Ambulatory Visit: Payer: Self-pay | Admitting: Physician Assistant

## 2022-11-03 ENCOUNTER — Other Ambulatory Visit: Payer: Self-pay | Admitting: Family Medicine

## 2022-11-04 ENCOUNTER — Ambulatory Visit
Admission: RE | Admit: 2022-11-04 | Discharge: 2022-11-04 | Disposition: A | Discharge status: Home / Self Care | Payer: 59 | Source: Ambulatory Visit | Attending: Family Medicine | Admitting: Family Medicine

## 2022-11-04 DIAGNOSIS — Z1231 Encounter for screening mammogram for malignant neoplasm of breast: Secondary | ICD-10-CM | POA: Diagnosis not present

## 2022-11-04 DIAGNOSIS — Z0000 Encounter for general adult medical examination without abnormal findings: Secondary | ICD-10-CM

## 2022-11-28 ENCOUNTER — Ambulatory Visit (INDEPENDENT_AMBULATORY_CARE_PROVIDER_SITE_OTHER): Payer: 59 | Admitting: Internal Medicine

## 2022-11-28 ENCOUNTER — Encounter: Payer: Self-pay | Admitting: Internal Medicine

## 2022-11-28 VITALS — BP 122/78 | HR 75 | Temp 97.6°F | Ht 61.0 in | Wt 196.0 lb

## 2022-11-28 DIAGNOSIS — J069 Acute upper respiratory infection, unspecified: Secondary | ICD-10-CM | POA: Diagnosis not present

## 2022-11-28 LAB — POC COVID19 BINAXNOW: SARS Coronavirus 2 Ag: NEGATIVE

## 2022-11-28 NOTE — Addendum Note (Signed)
Addended by: Eual Fines on: 11/28/2022 08:18 AM   Modules accepted: Orders

## 2022-11-28 NOTE — Progress Notes (Signed)
Subjective:    Patient ID: Alexandria Leach, female    DOB: 19-Jul-1950, 72 y.o.   MRN: [id number]  HPI Here due to cough and sore throat  Started 3 days ago----had been at a wedding over the weekend and thinks she was exposed Lots of cough---that has caused sore throat No fever, chills Chronic sweats --no change No myalgia Slight lightheaded feeling Frontal and maxillary headache No ear pain Very slight feeling of SOB--nothing new  Hasn't taken anything  Current Outpatient Medications on File Prior to Visit  Medication Sig Dispense Refill   ALPRAZolam (XANAX) 0.5 MG tablet Take 1 tablet (0.5 mg total) by mouth 3 (three) times daily as needed for anxiety. 270 tablet 2   antiseptic oral rinse (BIOTENE) LIQD 15 mLs by Mouth Rinse route 2 (two) times daily as needed for dry mouth.     cholecalciferol (VITAMIN D) 1000 UNITS tablet Take 1,000 Units by mouth daily.     co-enzyme Q-10 30 MG capsule Take 30 mg by mouth daily.     cyanocobalamin (VITAMIN B12) 1000 MCG tablet Take 1 tablet (1,000 mcg total) by mouth once a week.     diltiazem (CARDIZEM CD) 360 MG 24 hr capsule TAKE 1 CAPSULE BY MOUTH DAILY 90 capsule 3   docusate sodium (COLACE) 100 MG capsule Take 100 mg by mouth daily.     dofetilide (TIKOSYN) 250 MCG capsule TAKE 1 CAPSULE(250 MCG) BY MOUTH TWICE DAILY 180 capsule 1   ELIQUIS 5 MG TABS tablet TAKE 1 TABLET BY MOUTH TWICE  DAILY 200 tablet 2   ezetimibe (ZETIA) 10 MG tablet TAKE 1 TABLET BY MOUTH AT  BEDTIME 100 tablet 0   furosemide (LASIX) 20 MG tablet TAKE 3 TABLETS(60 MG) BY MOUTH DAILY 270 tablet 3   gabapentin (NEURONTIN) 300 MG capsule TAKE 2 CAPSULES BY MOUTH IN THE  MORNING 1 CAPSULE BY MOUTH IN  THE AFTERNOON AND 2 CAPSULES BY  MOUTH AT BEDTIME 500 capsule 2   loratadine (CLARITIN) 10 MG tablet Take 1 tablet (10 mg total) by mouth daily. 90 tablet 3   losartan (COZAAR) 100 MG tablet Take 1 tablet (100 mg total) by mouth daily. 100 tablet 1   metoprolol  tartrate (LOPRESSOR) 50 MG tablet TAKE 1 AND 1/2 TABLETS BY MOUTH  TWICE DAILY 300 tablet 2   pantoprazole (PROTONIX) 40 MG tablet TAKE 1 TABLET BY MOUTH DAILY 100 tablet 1   Polyethyl Glycol-Propyl Glycol (SYSTANE) 0.4-0.3 % GEL ophthalmic gel Place 1 drop into both eyes 2 (two) times daily.      RESTASIS 0.05 % ophthalmic emulsion INSTILL ONE DROP IN BOTH  EYES TWO TIMES DAILY 180 each 0   temazepam (RESTORIL) 30 MG capsule TAKE 1 CAPSULE(30 MG) BY MOUTH AT BEDTIME AS NEEDED FOR SLEEP 90 capsule 2   thiamine (VITAMIN B1) 100 MG tablet Take 1 tablet (100 mg total) by mouth once a week.     traMADol (ULTRAM) 50 MG tablet TAKE 1 TABLET BY MOUTH DAILY AS  NEEDED 30 tablet 0   potassium chloride (KLOR-CON) 10 MEQ tablet Take 1 tablet (10 mEq total) by mouth daily. 90 tablet 3   No current facility-administered medications on file prior to visit.    Allergies  Allergen Reactions   Cymbalta [Duloxetine Hcl] Other (See Comments)    tachycardia   Statins Nausea Only and Other (See Comments)    Muscle cramps also   Sulfa Antibiotics Nausea And Vomiting    Past Medical  History:  Diagnosis Date   Ankle fracture, left 02/19/2015   from a fall   Anxiety    Cataract 2014   corrected with surgery   CHF (congestive heart failure) (HCC)    CKD (chronic kidney disease) stage 3, GFR 30-59 ml/min [facility])    saw nephrologist Dr Stephens Shire   COVID-19 09/21/2021   DDD (degenerative disc disease), cervical    Depression    Fibromyalgia    Glaucoma    s/p surgery, sees ophtho Q6 mo   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 Thought benign.     Osteoarthritis    shoulders and knees, not RA per Dr Corliss Skains, positive ANA, positive Ro   Osteopenia 09/18/2015   DEXA T -1.1 hip, -0.2 spine 08/2015    Personal history of urinary calculi latest 2014   Pneumonia 12/02/2011   PONV (postoperative nausea and vomiting)    Refusal of blood transfusions as patient is Jehovah's Witness    Rheumatic heart disease 1980   s/p mitral valve repair 1980   Sjogren's syndrome (HCC)    Trimalleolar fracture of left ankle 02/23/2015    Past Surgical History:  Procedure Laterality Date   BREAST BIOPSY Right 2006   benign   CARDIOVERSION N/A 07/18/2020   Procedure: CARDIOVERSION;  Surgeon: Parke Poisson, MD;  Location: [facility] ENDOSCOPY;  Service: Cardiovascular;  Laterality: N/A;   CHOLECYSTECTOMY  11/27/2011   Procedure: LAPAROSCOPIC CHOLECYSTECTOMY WITH INTRAOPERATIVE CHOLANGIOGRAM;  Surgeon: Ernestene Mention, MD;  Location: [facility] OR;  Service: General;  Laterality: N/A;  laparoscopic cholecystectomy with choleangiogram umbilical hernia repair   COLONOSCOPY  07/2014   WNL Madilyn Fireman)   COLONOSCOPY  08/2019   3 TAs, rpt 3 yrs ([place])   dexa  08/2012   normal per patient - no records available   ESOPHAGOGASTRODUODENOSCOPY  08/2019   reactive gastropathy, neg H pylori ([place])   EYE SURGERY Bilateral 2014   cataract removal   MITRAL VALVE REPAIR  1980   open heart   ORIF ANKLE FRACTURE Left 02/26/2015   Procedure: OPEN REDUCTION INTERNAL FIXATION (ORIF) LEFT TRIMALLEOLAR ANKLE FRACTURE;  Surgeon: Tarry Kos, MD;  Location: [facility] OR;  Service: Orthopedics;  Laterality: Left;   TUBAL LIGATION  1980   UMBILICAL HERNIA REPAIR  11/27/2011   Procedure: HERNIA REPAIR UMBILICAL ADULT;  Surgeon: Ernestene Mention, MD;  Location[facility] OR;  Service: General;  Laterality: N/A;   VAGINAL HYSTERECTOMY  1992   for fibroids -- partial, ovaries remain    Family History  Problem Relation Age of Onset   Cancer Mother        lung (nonsmoker)   CAD Mother         MI in her 13s   ALS Mother    Kidney disease Father    Alcohol abuse Father    Diabetes Father    Lupus Sister        and niece   Diabetes Sister    Stroke Sister    Depression Sister    Lupus Sister    Blindness Sister  Cancer Maternal Uncle        bone   Stroke Maternal Grandmother    Cancer Brother        bone   Diabetes Brother    Heart attack Brother    Healthy Son    Healthy Son    Healthy Son    Healthy Son    Kidney failure Other        on HD   Breast cancer Neg Hx     Social History   Socioeconomic History   Marital status: Married    Spouse name: [name]   Number of children: 4   Years of education: 12   Highest education level: 12th grade  Occupational History    Employer: UNEMPLOYED    Comment: Disability  Tobacco Use   Smoking status: Never    Passive exposure: Past   Smokeless tobacco: Never   Tobacco comments:    Never smoke 08/29/21  Vaping Use   Vaping status: Never Used  Substance and Sexual Activity   Alcohol use: No    Alcohol/week: 0.0 standard drinks of alcohol   Drug use: No   Sexual activity: Not Currently    Birth control/protection: Surgical  Other Topics Concern   Not on file  Social History Narrative   Lives with son, 1 dog   Occupation: unemployed, on disability for fibromyalgia since 2008.   Edu: HS   Religion: Jehova's witness   Activity: volunteers at senior center   Diet: some water, fruits/vegetables daily   No caffeine use   Social Determinants of Health   Financial Resource Strain: Low Risk  (07/11/2022)   Overall Financial Resource Strain (CARDIA)    Difficulty of Paying Living Expenses: Not hard at all  Food Insecurity: No Food Insecurity (07/11/2022)   Hunger Vital Sign    Worried About Running Out of Food in the Last Year: Never true    Ran Out of Food in the Last Year: Never true  Transportation Needs: Unmet Transportation Needs (07/11/2022)   PRAPARE - Transportation    Lack of Transportation (Medical):  No    Lack of Transportation (Non-Medical): Yes  Physical Activity: Insufficiently Active (07/11/2022)   Exercise Vital Sign    Days of Exercise per Week: 3 days    Minutes of Exercise per Session: 10 min  Stress: No Stress Concern Present (07/11/2022)   [employer] of Occupational Health - Occupational Stress Questionnaire    Feeling of Stress : Only a little  Social Connections: Socially Integrated (07/11/2022)   Social Connection and Isolation Panel [NHANES]    Frequency of Communication with Friends and Family: Twice a week    Frequency of Social Gatherings with Friends and Family: Twice a week    Attends Religious Services: More than 4 times per year    Active Member of [employer] or Organizations: Yes    Attends Engineer, structural: More than 4 times per year    Marital Status: Married  Catering manager Violence: Not At Risk (02/12/2021)   Humiliation, Afraid, Rape, and Kick questionnaire    Fear of Current or Ex-Partner: No    Emotionally Abused: No    Physically Abused: No    Sexually Abused: No   Review of Systems Change in smell or taste No N/V Able to eat    Objective:   Physical Exam Constitutional:      Appearance: Normal appearance.  HENT:     Head:     Comments: No sinus tenderness  Right Ear: Tympanic membrane and ear canal normal.     Left Ear: Tympanic membrane and ear canal normal.     Mouth/Throat:     Pharynx: No oropharyngeal exudate or posterior oropharyngeal erythema.  Pulmonary:     Effort: Pulmonary effort is normal.     Breath sounds: Normal breath sounds. No wheezing or rales.  Musculoskeletal:     Cervical back: Neck supple.  Lymphadenopathy:     Cervical: No cervical adenopathy.  Neurological:     Mental Status: She is alert.            Assessment & Plan:

## 2022-11-28 NOTE — Assessment & Plan Note (Signed)
COVID negative Discussed symptom relief with tylenol Honey/DM over the counter cough syrups If worsens next week--would try empiric antibiotic since sinus symptoms

## 2022-11-29 ENCOUNTER — Inpatient Hospital Stay (HOSPITAL_COMMUNITY)
Admission: EM | Admit: 2022-11-29 | Discharge: 2022-12-03 | DRG: 871 | Disposition: A | Discharge status: Home / Self Care | Payer: 59 | Attending: Internal Medicine | Admitting: Internal Medicine

## 2022-11-29 ENCOUNTER — Emergency Department (HOSPITAL_COMMUNITY): Payer: 59

## 2022-11-29 ENCOUNTER — Other Ambulatory Visit: Payer: Self-pay

## 2022-11-29 DIAGNOSIS — F339 Major depressive disorder, recurrent, unspecified: Secondary | ICD-10-CM | POA: Diagnosis present

## 2022-11-29 DIAGNOSIS — I5032 Chronic diastolic (congestive) heart failure: Secondary | ICD-10-CM | POA: Diagnosis not present

## 2022-11-29 DIAGNOSIS — I482 Chronic atrial fibrillation, unspecified: Secondary | ICD-10-CM

## 2022-11-29 DIAGNOSIS — J189 Pneumonia, unspecified organism: Secondary | ICD-10-CM

## 2022-11-29 DIAGNOSIS — N183 Chronic kidney disease, stage 3 unspecified: Secondary | ICD-10-CM | POA: Diagnosis present

## 2022-11-29 DIAGNOSIS — Z811 Family history of alcohol abuse and dependence: Secondary | ICD-10-CM | POA: Diagnosis not present

## 2022-11-29 DIAGNOSIS — R0601 Orthopnea: Secondary | ICD-10-CM | POA: Diagnosis not present

## 2022-11-29 DIAGNOSIS — M797 Fibromyalgia: Secondary | ICD-10-CM | POA: Diagnosis present

## 2022-11-29 DIAGNOSIS — I5030 Unspecified diastolic (congestive) heart failure: Secondary | ICD-10-CM | POA: Diagnosis present

## 2022-11-29 DIAGNOSIS — I808 Phlebitis and thrombophlebitis of other sites: Secondary | ICD-10-CM | POA: Diagnosis present

## 2022-11-29 DIAGNOSIS — I351 Nonrheumatic aortic (valve) insufficiency: Secondary | ICD-10-CM | POA: Diagnosis present

## 2022-11-29 DIAGNOSIS — R0602 Shortness of breath: Secondary | ICD-10-CM | POA: Diagnosis not present

## 2022-11-29 DIAGNOSIS — A419 Sepsis, unspecified organism: Secondary | ICD-10-CM | POA: Diagnosis present

## 2022-11-29 DIAGNOSIS — I499 Cardiac arrhythmia, unspecified: Secondary | ICD-10-CM | POA: Diagnosis not present

## 2022-11-29 DIAGNOSIS — E872 Acidosis, unspecified: Secondary | ICD-10-CM | POA: Diagnosis present

## 2022-11-29 DIAGNOSIS — I130 Hypertensive heart and chronic kidney disease with heart failure and stage 1 through stage 4 chronic kidney disease, or unspecified chronic kidney disease: Secondary | ICD-10-CM | POA: Diagnosis present

## 2022-11-29 DIAGNOSIS — M19011 Primary osteoarthritis, right shoulder: Secondary | ICD-10-CM | POA: Diagnosis present

## 2022-11-29 DIAGNOSIS — Z8616 Personal history of COVID-19: Secondary | ICD-10-CM

## 2022-11-29 DIAGNOSIS — Z833 Family history of diabetes mellitus: Secondary | ICD-10-CM

## 2022-11-29 DIAGNOSIS — J9691 Respiratory failure, unspecified with hypoxia: Secondary | ICD-10-CM | POA: Diagnosis present

## 2022-11-29 DIAGNOSIS — M170 Bilateral primary osteoarthritis of knee: Secondary | ICD-10-CM | POA: Diagnosis present

## 2022-11-29 DIAGNOSIS — Z818 Family history of other mental and behavioral disorders: Secondary | ICD-10-CM

## 2022-11-29 DIAGNOSIS — R6520 Severe sepsis without septic shock: Secondary | ICD-10-CM | POA: Diagnosis not present

## 2022-11-29 DIAGNOSIS — M3500 Sicca syndrome, unspecified: Secondary | ICD-10-CM | POA: Diagnosis present

## 2022-11-29 DIAGNOSIS — Z841 Family history of disorders of kidney and ureter: Secondary | ICD-10-CM

## 2022-11-29 DIAGNOSIS — M19012 Primary osteoarthritis, left shoulder: Secondary | ICD-10-CM | POA: Diagnosis present

## 2022-11-29 DIAGNOSIS — Z823 Family history of stroke: Secondary | ICD-10-CM

## 2022-11-29 DIAGNOSIS — Z1152 Encounter for screening for COVID-19: Secondary | ICD-10-CM

## 2022-11-29 DIAGNOSIS — Z79899 Other long term (current) drug therapy: Secondary | ICD-10-CM

## 2022-11-29 DIAGNOSIS — I4892 Unspecified atrial flutter: Secondary | ICD-10-CM | POA: Diagnosis present

## 2022-11-29 DIAGNOSIS — R6889 Other general symptoms and signs: Secondary | ICD-10-CM | POA: Diagnosis not present

## 2022-11-29 DIAGNOSIS — Z531 Procedure and treatment not carried out because of patient's decision for reasons of belief and group pressure: Secondary | ICD-10-CM | POA: Diagnosis present

## 2022-11-29 DIAGNOSIS — Z8269 Family history of other diseases of the musculoskeletal system and connective tissue: Secondary | ICD-10-CM

## 2022-11-29 DIAGNOSIS — E785 Hyperlipidemia, unspecified: Secondary | ICD-10-CM | POA: Diagnosis present

## 2022-11-29 DIAGNOSIS — Z86718 Personal history of other venous thrombosis and embolism: Secondary | ICD-10-CM

## 2022-11-29 DIAGNOSIS — R0689 Other abnormalities of breathing: Secondary | ICD-10-CM | POA: Diagnosis not present

## 2022-11-29 DIAGNOSIS — Z743 Need for continuous supervision: Secondary | ICD-10-CM | POA: Diagnosis not present

## 2022-11-29 DIAGNOSIS — Z821 Family history of blindness and visual loss: Secondary | ICD-10-CM

## 2022-11-29 DIAGNOSIS — Z8249 Family history of ischemic heart disease and other diseases of the circulatory system: Secondary | ICD-10-CM

## 2022-11-29 DIAGNOSIS — F419 Anxiety disorder, unspecified: Secondary | ICD-10-CM | POA: Diagnosis present

## 2022-11-29 DIAGNOSIS — I10 Essential (primary) hypertension: Secondary | ICD-10-CM

## 2022-11-29 DIAGNOSIS — Z7189 Other specified counseling: Secondary | ICD-10-CM

## 2022-11-29 DIAGNOSIS — Z87442 Personal history of urinary calculi: Secondary | ICD-10-CM

## 2022-11-29 DIAGNOSIS — J9601 Acute respiratory failure with hypoxia: Secondary | ICD-10-CM | POA: Diagnosis not present

## 2022-11-29 DIAGNOSIS — M8580 Other specified disorders of bone density and structure, unspecified site: Secondary | ICD-10-CM | POA: Diagnosis present

## 2022-11-29 DIAGNOSIS — Z7901 Long term (current) use of anticoagulants: Secondary | ICD-10-CM

## 2022-11-29 DIAGNOSIS — I5033 Acute on chronic diastolic (congestive) heart failure: Secondary | ICD-10-CM | POA: Diagnosis not present

## 2022-11-29 DIAGNOSIS — Z882 Allergy status to sulfonamides status: Secondary | ICD-10-CM

## 2022-11-29 DIAGNOSIS — Z888 Allergy status to other drugs, medicaments and biological substances status: Secondary | ICD-10-CM

## 2022-11-29 DIAGNOSIS — Z86711 Personal history of pulmonary embolism: Secondary | ICD-10-CM

## 2022-11-29 DIAGNOSIS — Z66 Do not resuscitate: Secondary | ICD-10-CM | POA: Diagnosis present

## 2022-11-29 DIAGNOSIS — I509 Heart failure, unspecified: Secondary | ICD-10-CM

## 2022-11-29 HISTORY — DX: Pneumonia, unspecified organism: J18.9

## 2022-11-29 LAB — CBC WITH DIFFERENTIAL/PLATELET
Abs Immature Granulocytes: 0.05 10*3/uL (ref 0.00–0.07)
Basophils Absolute: 0.1 10*3/uL (ref 0.0–0.1)
Basophils Relative: 0 %
Eosinophils Absolute: 0 10*3/uL (ref 0.0–0.5)
Eosinophils Relative: 0 %
HCT: 47.2 % — ABNORMAL HIGH (ref 36.0–46.0)
Hemoglobin: 14.8 g/dL (ref 12.0–15.0)
Immature Granulocytes: 0 %
Lymphocytes Relative: 7 %
Lymphs Abs: 0.9 10*3/uL (ref 0.7–4.0)
MCH: 26.8 pg (ref 26.0–34.0)
MCHC: 31.4 g/dL (ref 30.0–36.0)
MCV: 85.4 fL (ref 80.0–100.0)
Monocytes Absolute: 0.6 10*3/uL (ref 0.1–1.0)
Monocytes Relative: 5 %
Neutro Abs: 10.8 10*3/uL — ABNORMAL HIGH (ref 1.7–7.7)
Neutrophils Relative %: 88 %
Platelets: 238 10*3/uL (ref 150–400)
RBC: 5.53 MIL/uL — ABNORMAL HIGH (ref 3.87–5.11)
RDW: 14.6 % (ref 11.5–15.5)
WBC: 12.4 10*3/uL — ABNORMAL HIGH (ref 4.0–10.5)
nRBC: 0 % (ref 0.0–0.2)

## 2022-11-29 LAB — RESP PANEL BY RT-PCR (RSV, FLU A&B, COVID)  RVPGX2
Influenza A by PCR: NEGATIVE
Influenza B by PCR: NEGATIVE
Resp Syncytial Virus by PCR: NEGATIVE
SARS Coronavirus 2 by RT PCR: NEGATIVE

## 2022-11-29 LAB — COMPREHENSIVE METABOLIC PANEL
ALT: 19 U/L (ref 0–44)
AST: 38 U/L (ref 15–41)
Albumin: 3.8 g/dL (ref 3.5–5.0)
Alkaline Phosphatase: 64 U/L (ref 38–126)
Anion gap: 12 (ref 5–15)
BUN: 9 mg/dL (ref 8–23)
CO2: 18 mmol/L — ABNORMAL LOW (ref 22–32)
Calcium: 9.5 mg/dL (ref 8.9–10.3)
Chloride: 105 mmol/L (ref 98–111)
Creatinine, Ser: 1.3 mg/dL — ABNORMAL HIGH (ref 0.44–1.00)
GFR, Estimated: 44 mL/min — ABNORMAL LOW (ref >60)
Glucose, Bld: 116 mg/dL — ABNORMAL HIGH (ref 70–99)
Potassium: 4.6 mmol/L (ref 3.5–5.1)
Sodium: 135 mmol/L (ref 135–145)
Total Bilirubin: 1.3 mg/dL — ABNORMAL HIGH (ref 0.3–1.2)
Total Protein: 7.6 g/dL (ref 6.5–8.1)

## 2022-11-29 LAB — URINALYSIS, W/ REFLEX TO CULTURE (INFECTION SUSPECTED)
Bilirubin Urine: NEGATIVE
Glucose, UA: NEGATIVE mg/dL
Ketones, ur: NEGATIVE mg/dL
Nitrite: NEGATIVE
Protein, ur: NEGATIVE mg/dL
Specific Gravity, Urine: 1.02 (ref 1.005–1.030)
pH: 7 (ref 5.0–8.0)

## 2022-11-29 LAB — BLOOD GAS, VENOUS
Acid-Base Excess: 0.3 mmol/L (ref 0.0–2.0)
Bicarbonate: 23.3 mmol/L (ref 20.0–28.0)
O2 Saturation: 100 %
Patient temperature: 37
pCO2, Ven: 32 mmHg — ABNORMAL LOW (ref 44–60)
pH, Ven: 7.47 — ABNORMAL HIGH (ref 7.25–7.43)
pO2, Ven: 169 mmHg — ABNORMAL HIGH (ref 32–45)

## 2022-11-29 LAB — I-STAT CG4 LACTIC ACID, ED
Lactic Acid, Venous: 2.9 mmol/L (ref 0.5–1.9)
Lactic Acid, Venous: 7.6 mmol/L (ref 0.5–1.9)
Lactic Acid, Venous: 2.2 mmol/L (ref 0.5–1.9)

## 2022-11-29 LAB — TROPONIN I (HIGH SENSITIVITY)
Troponin I (High Sensitivity): 33 ng/L — ABNORMAL HIGH (ref <18)
Troponin I (High Sensitivity): 28 ng/L — ABNORMAL HIGH (ref <18)
Troponin I (High Sensitivity): 49 ng/L — ABNORMAL HIGH (ref <18)

## 2022-11-29 LAB — APTT: aPTT: 30 s (ref 24–36)

## 2022-11-29 LAB — PROTIME-INR
INR: 1.2 (ref 0.8–1.2)
Prothrombin Time: 15.4 s — ABNORMAL HIGH (ref 11.4–15.2)

## 2022-11-29 LAB — BRAIN NATRIURETIC PEPTIDE: B Natriuretic Peptide: 242.7 pg/mL — ABNORMAL HIGH (ref 0.0–100.0)

## 2022-11-29 LAB — HIV ANTIBODY (ROUTINE TESTING W REFLEX): HIV Screen 4th Generation wRfx: NONREACTIVE

## 2022-11-29 MED ORDER — ALPRAZOLAM 0.25 MG PO TABS: 0.2500 mg | ORAL_TABLET | Freq: Three times a day (TID) | ORAL | Status: DC | PRN

## 2022-11-29 MED ORDER — GABAPENTIN 300 MG PO CAPS
600.0000 mg | ORAL_CAPSULE | Freq: Two times a day (BID) | ORAL | Status: DC
  Administered 2022-11-29 – 2022-12-03 (×8): 600 mg via ORAL
  Filled 2022-11-29 (×8): qty 2

## 2022-11-29 MED ORDER — LACTATED RINGERS IV BOLUS (SEPSIS)
500.0000 mL | Freq: Once | INTRAVENOUS | Status: AC
  Administered 2022-11-29: 500 mL via INTRAVENOUS

## 2022-11-29 MED ORDER — SODIUM CHLORIDE 0.9 % IV SOLN
500.0000 mg | Freq: Once | INTRAVENOUS | Status: AC
  Administered 2022-11-29: 500 mg via INTRAVENOUS
  Filled 2022-11-29: qty 5

## 2022-11-29 MED ORDER — POTASSIUM CHLORIDE CRYS ER 10 MEQ PO TBCR
10.0000 meq | EXTENDED_RELEASE_TABLET | Freq: Every day | ORAL | Status: DC
  Administered 2022-11-30 – 2022-12-01 (×2): 10 meq via ORAL
  Filled 2022-11-29 (×2): qty 1

## 2022-11-29 MED ORDER — ENOXAPARIN SODIUM 40 MG/0.4ML IJ SOSY: 40.0000 mg | PREFILLED_SYRINGE | INTRAMUSCULAR | Status: DC

## 2022-11-29 MED ORDER — SODIUM CHLORIDE 0.9 % IV SOLN
500.0000 mg | INTRAVENOUS | Status: DC
  Administered 2022-11-30: 500 mg via INTRAVENOUS
  Filled 2022-11-29: qty 5

## 2022-11-29 MED ORDER — THIAMINE HCL 100 MG PO TABS
100.0000 mg | ORAL_TABLET | ORAL | Status: DC
  Administered 2022-11-29: 100 mg via ORAL
  Filled 2022-11-29: qty 1

## 2022-11-29 MED ORDER — VITAMIN D 25 MCG (1000 UNIT) PO TABS
1000.0000 [IU] | ORAL_TABLET | Freq: Every day | ORAL | Status: DC
  Administered 2022-11-30 – 2022-12-03 (×4): 1000 [IU] via ORAL
  Filled 2022-11-29 (×4): qty 1

## 2022-11-29 MED ORDER — METOPROLOL TARTRATE 50 MG PO TABS
75.0000 mg | ORAL_TABLET | Freq: Two times a day (BID) | ORAL | Status: DC
  Administered 2022-11-29 – 2022-12-03 (×8): 75 mg via ORAL
  Filled 2022-11-29 (×3): qty 1
  Filled 2022-11-29: qty 3
  Filled 2022-11-29 (×2): qty 1
  Filled 2022-11-29: qty 3
  Filled 2022-11-29: qty 1

## 2022-11-29 MED ORDER — SODIUM CHLORIDE 0.9 % IV SOLN
1.0000 g | INTRAVENOUS | Status: DC
  Administered 2022-11-30 – 2022-12-01 (×2): 1 g via INTRAVENOUS
  Filled 2022-11-29 (×3): qty 10

## 2022-11-29 MED ORDER — SODIUM CHLORIDE 0.9% FLUSH
3.0000 mL | Freq: Two times a day (BID) | INTRAVENOUS | Status: DC
  Administered 2022-11-30 – 2022-12-01 (×3): 3 mL via INTRAVENOUS

## 2022-11-29 MED ORDER — APIXABAN 5 MG PO TABS
5.0000 mg | ORAL_TABLET | Freq: Two times a day (BID) | ORAL | Status: DC
  Administered 2022-11-29 – 2022-12-03 (×8): 5 mg via ORAL
  Filled 2022-11-29 (×8): qty 1

## 2022-11-29 MED ORDER — ACETAMINOPHEN 325 MG PO TABS
650.0000 mg | ORAL_TABLET | Freq: Four times a day (QID) | ORAL | Status: DC | PRN
  Administered 2022-11-29 – 2022-12-01 (×2): 650 mg via ORAL
  Filled 2022-11-29 (×2): qty 2

## 2022-11-29 MED ORDER — DOCUSATE SODIUM 100 MG PO CAPS
100.0000 mg | ORAL_CAPSULE | Freq: Every day | ORAL | Status: DC
  Administered 2022-11-29 – 2022-12-03 (×5): 100 mg via ORAL
  Filled 2022-11-29 (×5): qty 1

## 2022-11-29 MED ORDER — TEMAZEPAM 15 MG PO CAPS: 30.0000 mg | ORAL_CAPSULE | Freq: Every evening | ORAL | Status: DC | PRN

## 2022-11-29 MED ORDER — POLYETHYLENE GLYCOL 3350 17 G PO PACK: 17.0000 g | PACK | Freq: Every day | ORAL | Status: DC | PRN

## 2022-11-29 MED ORDER — DOFETILIDE 250 MCG PO CAPS
250.0000 ug | ORAL_CAPSULE | Freq: Two times a day (BID) | ORAL | Status: DC
  Administered 2022-11-29 – 2022-12-03 (×8): 250 ug via ORAL
  Filled 2022-11-29 (×9): qty 1

## 2022-11-29 MED ORDER — ACETAMINOPHEN 650 MG RE SUPP: 650.0000 mg | Freq: Four times a day (QID) | RECTAL | Status: DC | PRN

## 2022-11-29 MED ORDER — FUROSEMIDE 20 MG PO TABS: 60.0000 mg | ORAL_TABLET | Freq: Every day | ORAL | Status: DC

## 2022-11-29 MED ORDER — PANTOPRAZOLE SODIUM 40 MG PO TBEC
40.0000 mg | DELAYED_RELEASE_TABLET | Freq: Every day | ORAL | Status: DC
  Administered 2022-11-30 – 2022-12-03 (×4): 40 mg via ORAL
  Filled 2022-11-29 (×4): qty 1

## 2022-11-29 MED ORDER — TRAMADOL HCL 50 MG PO TABS: 50.0000 mg | ORAL_TABLET | Freq: Every day | ORAL | Status: DC | PRN

## 2022-11-29 MED ORDER — GABAPENTIN 300 MG PO CAPS
300.0000 mg | ORAL_CAPSULE | ORAL | Status: DC
  Administered 2022-11-30 – 2022-12-02 (×3): 300 mg via ORAL
  Filled 2022-11-29 (×3): qty 1

## 2022-11-29 MED ORDER — LACTATED RINGERS IV BOLUS: 500.0000 mL | Freq: Once | INTRAVENOUS | Status: DC

## 2022-11-29 MED ORDER — CYCLOSPORINE 0.05 % OP EMUL
1.0000 [drp] | Freq: Two times a day (BID) | OPHTHALMIC | Status: DC
  Administered 2022-11-30 – 2022-12-03 (×5): 1 [drp] via OPHTHALMIC
  Filled 2022-11-29 (×8): qty 30

## 2022-11-29 MED ORDER — VITAMIN B-12 1000 MCG PO TABS
1000.0000 ug | ORAL_TABLET | ORAL | Status: DC
  Administered 2022-11-29: 1000 ug via ORAL
  Filled 2022-11-29: qty 1

## 2022-11-29 MED ORDER — EZETIMIBE 10 MG PO TABS
10.0000 mg | ORAL_TABLET | Freq: Every day | ORAL | Status: DC
  Administered 2022-11-29 – 2022-12-02 (×4): 10 mg via ORAL
  Filled 2022-11-29 (×4): qty 1

## 2022-11-29 MED ORDER — DILTIAZEM HCL ER COATED BEADS 180 MG PO CP24
360.0000 mg | ORAL_CAPSULE | Freq: Every day | ORAL | Status: DC
  Administered 2022-11-30 – 2022-12-03 (×4): 360 mg via ORAL
  Filled 2022-11-29 (×4): qty 2

## 2022-11-29 MED ORDER — LACTATED RINGERS IV SOLN: INTRAVENOUS | Status: DC

## 2022-11-29 MED ORDER — SODIUM CHLORIDE 0.9 % IV SOLN
1.0000 g | Freq: Once | INTRAVENOUS | Status: AC
  Administered 2022-11-29: 1 g via INTRAVENOUS
  Filled 2022-11-29: qty 10

## 2022-11-29 NOTE — Assessment & Plan Note (Addendum)
Multifocal biatleral lower lobe predominant. White count of 12.4 associated with slight tachycardia as well as slight hypoxia per report.  Meets criteria for sepsis, got 500 cc of fluid in the ER bolus as well as 150 cc/h of running fluids at this time.  Lactic acid has trended up, however clinically patient appears nontoxic.  Therefore I think that this lactic acid elevation may be spurious and I have discussed with RN and we will repeat lactic acid right now along with troponin and an ABG, given patient's persistent symptoms.  Overall I think patient is having slight pleuritic chest discomfort that is causing her to have rapid shallow breathing.  I will order incentive spirometry, also give Tylenol.  Blood cultures were drawn by the ER attending.  Treat with ceftriaxone and azithromycin antibiotics started in the ER

## 2022-11-29 NOTE — Assessment & Plan Note (Addendum)
C.w. lasix. Monitor I/o . See overview.  Currently euvolemic, however I am being careful with the fluids given her respiratory status and diagnosis of heart failure in the past.

## 2022-11-29 NOTE — Assessment & Plan Note (Signed)
Patient wants to be DNR. Has DNR form at home. However, I dont see it here. Will change order.

## 2022-11-29 NOTE — ED Provider Notes (Signed)
[place] EMERGENCY DEPARTMENT AT [facility] Provider Note   CSN: [id number] Arrival date & time: 11/29/22  1447     History  Chief Complaint  Patient presents with   Shortness of Breath    Delaina Hovorka is a 72 y.o. female.  HPI   72 year old female presents today with a complaint of shortness of breath, weakness, productive cough with congestion.  Patient states her son got married over the weekend.  Following that she developed GI symptoms and abdominal pain.  Her primary doctor thought she picked up a virus and she was trying to control her symptoms at home.  However the last couple days she has been increasingly short of breath, cough productive of clear phlegm.  She has been having nausea with intermittent vomiting as well as fever and chills.  No severe chest or back pain.  Abdominal pain is generalized with decreased appetite.  No diarrhea.  Home Medications Prior to Admission medications   Medication Sig Start Date End Date Taking? Authorizing Provider  ALPRAZolam Prudy Feeler) 0.5 MG tablet Take 1 tablet (0.5 mg total) by mouth 3 (three) times daily as needed for anxiety. 10/23/22 10/23/23  Myrlene Broker, MD  antiseptic oral rinse (BIOTENE) LIQD 15 mLs by Mouth Rinse route 2 (two) times daily as needed for dry mouth.    [provider]  cholecalciferol (VITAMIN D) 1000 UNITS tablet Take 1,000 Units by mouth daily.    [provider]  co-enzyme Q-10 30 MG capsule Take 30 mg by mouth daily.    [provider]  cyanocobalamin (VITAMIN B12) 1000 MCG tablet Take 1 tablet (1,000 mcg total) by mouth once a week. 01/11/22   Eustaquio Boyden, MD  diltiazem [facility] CD) 360 MG 24 hr capsule TAKE 1 CAPSULE BY MOUTH DAILY 04/15/22   Lewayne Bunting, MD  docusate sodium (COLACE) 100 MG capsule Take 100 mg by mouth daily.    [provider]  dofetilide (TIKOSYN) 250 MCG capsule TAKE 1 CAPSULE(250 MCG) BY MOUTH TWICE DAILY 10/28/22   Fenton,  [place]avale R, PA  ELIQUIS 5 MG TABS tablet TAKE 1 TABLET BY MOUTH TWICE  DAILY 05/21/22   Newman Nip, NP  ezetimibe (ZETIA) 10 MG tablet TAKE 1 TABLET BY MOUTH AT  BEDTIME 11/04/22   Eustaquio Boyden, MD  furosemide (LASIX) 20 MG tablet TAKE 3 TABLETS(60 MG) BY MOUTH DAILY 09/24/22   Lewayne Bunting, MD  gabapentin (NEURONTIN) 300 MG capsule TAKE 2 CAPSULES BY MOUTH IN THE  MORNING 1 CAPSULE BY MOUTH IN  THE AFTERNOON AND 2 CAPSULES BY  MOUTH AT BEDTIME 10/10/22   Eustaquio Boyden, MD  loratadine (CLARITIN) 10 MG tablet Take 1 tablet (10 mg total) by mouth daily. 02/19/22   Eustaquio Boyden, MD  losartan (COZAAR) 100 MG tablet Take 1 tablet (100 mg total) by mouth daily. 07/15/22   Eustaquio Boyden, MD  metoprolol tartrate (LOPRESSOR) 50 MG tablet TAKE 1 AND 1/2 TABLETS BY MOUTH  TWICE DAILY 03/25/22   Lewayne Bunting, MD  pantoprazole (PROTONIX) 40 MG tablet TAKE 1 TABLET BY MOUTH DAILY 09/23/22   Eustaquio Boyden, MD  Polyethyl Glycol-Propyl Glycol (SYSTANE) 0.4-0.3 % GEL ophthalmic gel Place 1 drop into both eyes 2 (two) times daily.     [provider]  potassium chloride (KLOR-CON) 10 MEQ tablet Take 1 tablet (10 mEq total) by mouth daily. 02/01/22 10/11/22  Fenton, Clint R, PA  RESTASIS 0.05 % ophthalmic emulsion INSTILL ONE DROP IN  BOTH  EYES TWO TIMES DAILY 12/02/16   Eustaquio Boyden, MD  temazepam (RESTORIL) 30 MG capsule TAKE 1 CAPSULE(30 MG) BY MOUTH AT BEDTIME AS NEEDED FOR SLEEP 10/23/22   Myrlene Broker, MD  thiamine (VITAMIN B1) 100 MG tablet Take 1 tablet (100 mg total) by mouth once a week. 07/31/22   Eustaquio Boyden, MD  traMADol Janean Sark) 50 MG tablet TAKE 1 TABLET BY MOUTH DAILY AS  NEEDED 05/21/22   Pollyann Savoy, MD      Allergies    Cymbalta [duloxetine hcl], Statins, and Sulfa antibiotics    Review of Systems   Review of Systems  Constitutional:  Positive for chills, fatigue and fever.  Respiratory:  Positive for cough and shortness of breath.   Cardiovascular:   Negative for chest pain.  Gastrointestinal:  Positive for abdominal pain, nausea and vomiting. Negative for diarrhea.  Skin:  Negative for rash.  Neurological:  Negative for headaches.    Physical Exam Updated Vital Signs BP (!) 158/86 (BP Location: Right Arm)   Pulse (!) 108   Resp 16   Ht 5\' 1"  (1.549 m)   Wt 88 kg   SpO2 98%   BMI 36.66 kg/m  Physical Exam Vitals and nursing note reviewed.  Constitutional:      Appearance: Normal appearance. She is ill-appearing.  HENT:     Head: Normocephalic.     Mouth/Throat:     Mouth: Mucous membranes are moist.  Cardiovascular:     Rate and Rhythm: Tachycardia present.  Pulmonary:     Effort: Pulmonary effort is normal. Tachypnea present. No respiratory distress.     Breath sounds: Examination of the right-lower field reveals decreased breath sounds. Examination of the left-lower field reveals decreased breath sounds. Decreased breath sounds and rales present.  Abdominal:     Palpations: Abdomen is soft.     Tenderness: There is no abdominal tenderness.  Musculoskeletal:     Right lower leg: No edema.     Left lower leg: No edema.  Skin:    General: Skin is warm.  Neurological:     Mental Status: She is alert and oriented to person, place, and time. Mental status is at baseline.  Psychiatric:        Mood and Affect: Mood normal.     ED Results / Procedures / Treatments   Labs (all labs ordered are listed, but only abnormal results are displayed) Labs Reviewed  RESP PANEL BY RT-PCR (RSV, FLU A&B, COVID)  [id number]  CULTURE, BLOOD (ROUTINE X 2)  CULTURE, BLOOD (ROUTINE X 2)  COMPREHENSIVE METABOLIC PANEL  CBC WITH DIFFERENTIAL/PLATELET  PROTIME-INR  APTT  URINALYSIS, W/ REFLEX TO CULTURE (INFECTION SUSPECTED)  I-STAT CG4 LACTIC ACID, ED    EKG EKG Interpretation Date/Time:  Friday November 29 2022 15:16:26 EDT Ventricular Rate:  109 PR Interval:  204 QRS Duration:  83 QT Interval:  288 QTC Calculation: 388 R  Axis:   90  Text Interpretation: Sinus tachycardia Borderline right axis deviation Borderline repolarization abnormality Confirmed by Coralee Pesa [phone]) on 11/29/2022 3:49:45 PM  Radiology No results found.  Procedures Procedures    Medications Ordered in ED Medications  lactated ringers infusion (has no administration in time range)  lactated ringers bolus 500 mL (has no administration in time range)    ED Course/ Medical Decision Making/ A&P  Medical Decision Making Amount and/or Complexity of Data Reviewed Labs: ordered. Radiology: ordered.  Risk Prescription drug management.   72 year old female presents emergency department with concern for shortness of breath, productive cough, fever/chills, nausea/vomiting.  Ongoing and worsening for the last 3 days.  She is tachycardic on arrival, was noted to be hypoxic with EMS to 85%, placed on nasal cannula and respirations are easy at this time.  She does have diminished breath sounds and scattered rales.  History of heart failure that she takes a diuretic for.  Will plan for septic evaluation.  Will not give full septic protocol fluids secondary to heart failure history in the setting of hypoxia and rales on lung exam.  Patient signed out pending lab evaluation.        Final Clinical Impression(s) / ED Diagnoses Final diagnoses:  None    Rx / DC Orders ED Discharge Orders     None         Rozelle Logan, DO 11/29/22 1604

## 2022-11-29 NOTE — H&P (Signed)
History and Physical    Patient: Alexandria Leach ZOX:[id number] DOB: [birth year] DOA: 11/29/2022 DOS: the patient was seen and examined on 11/29/2022 PCP: Eustaquio Boyden, MD  Patient coming from: Home  Chief Complaint:  Chief Complaint  Patient presents with   Shortness of Breath   HPI: [name] is a 72 y.o. 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  Patient was in her usual state of health till approximately 6 days ago before the weekend when she reports a new onset of cough with scant clear sputum.  Patient did mostly well for several days after that.  However over the last 24 hours has developed right-sided chest pain that is not pleuritic not related to exertion on and off.  Patient subsequently developed nausea and had through 2 episodes of vomiting however has been able to tolerate fluids since then.  In the last 12 hours patient has developed persistent progressive shortness of breath that is present at rest worse with exertion.  Relieved somewhat with rest.  There is no documented fever no leg swelling no trauma.  Patient came to the ER because of sudden worsening of her symptoms over the last 24 hours as above.  Patient was actually found to be slightly hypoxic in the ER per report and stabilized on 2 L/min of supplementary oxygen and has been given ceftriaxone and azithromycin since then, workup as noted below.  Patient is still reporting sensation of shortness of breath, however feels much better compared to what she felt at home.  Is thirsty.  Having slight headache and slight right-sided anterior chest discomfort.  Is not radiating to back.   Review of Systems: As mentioned in the history of present illness. All other systems reviewed and are negative. Past Medical  History:  Diagnosis Date   Ankle fracture, left 02/19/2015   from a fall   Anxiety    Cataract 2014   corrected with surgery   CHF (congestive heart failure) (HCC)    CKD (chronic kidney disease) stage 3, GFR 30-59 ml/min [facility])    saw nephrologist Dr Stephens Shire   COVID-19 09/21/2021   DDD (degenerative disc disease), cervical    Depression    Fibromyalgia    Glaucoma    s/p surgery, sees ophtho Q6 mo   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 Thought benign.    Osteoarthritis    shoulders and knees, not RA per Dr Corliss Skains, positive ANA, positive Ro   Osteopenia 09/18/2015   DEXA T -1.1 hip, -0.2 spine 08/2015    Personal history of urinary calculi latest 2014   Pneumonia 12/02/2011   PONV (postoperative nausea and vomiting)    Refusal of blood transfusions as patient is Jehovah's Witness    Rheumatic heart disease 1980   s/p mitral valve repair 1980   Sjogren's syndrome (HCC)    Trimalleolar fracture of left ankle 02/23/2015   Past Surgical History:  Procedure Laterality  Date   BREAST BIOPSY Right 2006   benign   CARDIOVERSION N/A 07/18/2020   Procedure: CARDIOVERSION;  Surgeon: Parke Poisson, MD;  Location: [facility] ENDOSCOPY;  Service: Cardiovascular;  Laterality: N/A;   CHOLECYSTECTOMY  11/27/2011   Procedure: LAPAROSCOPIC CHOLECYSTECTOMY WITH INTRAOPERATIVE CHOLANGIOGRAM;  Surgeon: Ernestene Mention, MD;  Location: [facility] OR;  Service: General;  Laterality: N/A;  laparoscopic cholecystectomy with choleangiogram umbilical hernia repair   COLONOSCOPY  07/2014   WNL Madilyn Fireman)   COLONOSCOPY  08/2019   3 TAs, rpt 3 yrs ([place])   dexa   08/2012   normal per patient - no records available   ESOPHAGOGASTRODUODENOSCOPY  08/2019   reactive gastropathy, neg H pylori ([place])   EYE SURGERY Bilateral 2014   cataract removal   MITRAL VALVE REPAIR  1980   open heart   ORIF ANKLE FRACTURE Left 02/26/2015   Procedure: OPEN REDUCTION INTERNAL FIXATION (ORIF) LEFT TRIMALLEOLAR ANKLE FRACTURE;  Surgeon: Tarry Kos, MD;  Location: [facility] OR;  Service: Orthopedics;  Laterality: Left;   TUBAL LIGATION  1980   UMBILICAL HERNIA REPAIR  11/27/2011   Procedure: HERNIA REPAIR UMBILICAL ADULT;  Surgeon: Ernestene Mention, MD;  Location: [facility] OR;  Service: General;  Laterality: N/A;   VAGINAL HYSTERECTOMY  1992   for fibroids -- partial, ovaries remain   Social History:  reports that she has never smoked. She has been exposed to tobacco smoke. She has never used smokeless tobacco. She reports that she does not drink alcohol and does not use drugs.  Allergies  Allergen Reactions   Cymbalta [Duloxetine Hcl] Other (See Comments)    tachycardia   Statins Nausea Only and Other (See Comments)    Muscle cramps also   Sulfa Antibiotics Nausea And Vomiting    Family History  Problem Relation Age of Onset   Cancer Mother        lung (nonsmoker)   CAD Mother        MI in her 84s   ALS Mother    Kidney disease Father    Alcohol abuse Father    Diabetes Father    Lupus Sister        and niece   Diabetes Sister    Stroke Sister    Depression Sister    Lupus Sister    Blindness Sister    Cancer Maternal Uncle        bone   Stroke Maternal Grandmother    Cancer Brother        bone   Diabetes Brother    Heart attack Brother    Healthy Son    Healthy Son    Healthy Son    Healthy Son    Kidney failure Other        on HD   Breast cancer Neg Hx     Prior to Admission medications   Medication Sig Start Date End Date Taking? Authorizing Provider  ALPRAZolam Prudy Feeler) 0.5 MG tablet Take 1 tablet (0.5 mg total) by mouth 3 (three) times  daily as needed for anxiety. 10/23/22 10/23/23  Myrlene Broker, MD  antiseptic oral rinse (BIOTENE) LIQD 15 mLs by Mouth Rinse route 2 (two) times daily as needed for dry mouth.    [provider]  cholecalciferol (VITAMIN D) 1000 UNITS tablet Take 1,000 Units by mouth daily.    [provider]  co-enzyme Q-10 30 MG capsule Take 30 mg by mouth daily.    [provider]  cyanocobalamin (  VITAMIN B12) 1000 MCG tablet Take 1 tablet (1,000 mcg total) by mouth once a week. 01/11/22   Eustaquio Boyden, MD  diltiazem [facility] CD) 360 MG 24 hr capsule TAKE 1 CAPSULE BY MOUTH DAILY 04/15/22   Lewayne Bunting, MD  docusate sodium (COLACE) 100 MG capsule Take 100 mg by mouth daily.    [provider]  dofetilide (TIKOSYN) 250 MCG capsule TAKE 1 CAPSULE(250 MCG) BY MOUTH TWICE DAILY 10/28/22   Fenton, [place] R, PA  ELIQUIS 5 MG TABS tablet TAKE 1 TABLET BY MOUTH TWICE  DAILY 05/21/22   Newman Nip, NP  ezetimibe (ZETIA) 10 MG tablet TAKE 1 TABLET BY MOUTH AT  BEDTIME 11/04/22   Eustaquio Boyden, MD  furosemide (LASIX) 20 MG tablet TAKE 3 TABLETS(60 MG) BY MOUTH DAILY 09/24/22   Lewayne Bunting, MD  gabapentin (NEURONTIN) 300 MG capsule TAKE 2 CAPSULES BY MOUTH IN THE  MORNING 1 CAPSULE BY MOUTH IN  THE AFTERNOON AND 2 CAPSULES BY  MOUTH AT BEDTIME 10/10/22   Eustaquio Boyden, MD  loratadine (CLARITIN) 10 MG tablet Take 1 tablet (10 mg total) by mouth daily. 02/19/22   Eustaquio Boyden, MD  losartan (COZAAR) 100 MG tablet Take 1 tablet (100 mg total) by mouth daily. 07/15/22   Eustaquio Boyden, MD  metoprolol tartrate (LOPRESSOR) 50 MG tablet TAKE 1 AND 1/2 TABLETS BY MOUTH  TWICE DAILY 03/25/22   Lewayne Bunting, MD  pantoprazole (PROTONIX) 40 MG tablet TAKE 1 TABLET BY MOUTH DAILY 09/23/22   Eustaquio Boyden, MD  Polyethyl Glycol-Propyl Glycol (SYSTANE) 0.4-0.3 % GEL ophthalmic gel Place 1 drop into both eyes 2 (two) times daily.     [provider]  potassium  chloride (KLOR-CON) 10 MEQ tablet Take 1 tablet (10 mEq total) by mouth daily. 02/01/22 10/11/22  Fenton, Clint R, PA  RESTASIS 0.05 % ophthalmic emulsion INSTILL ONE DROP IN BOTH  EYES TWO TIMES DAILY 12/02/16   Eustaquio Boyden, MD  temazepam (RESTORIL) 30 MG capsule TAKE 1 CAPSULE(30 MG) BY MOUTH AT BEDTIME AS NEEDED FOR SLEEP 10/23/22   Myrlene Broker, MD  thiamine (VITAMIN B1) 100 MG tablet Take 1 tablet (100 mg total) by mouth once a week. 07/31/22   Eustaquio Boyden, MD  traMADol Janean Sark) 50 MG tablet TAKE 1 TABLET BY MOUTH DAILY AS  NEEDED 05/21/22   Pollyann Savoy, MD    Physical Exam: Vitals:   11/29/22 1545 11/29/22 1622 11/29/22 1700 11/29/22 1830  BP: (!) 146/87  (!) 163/72 (!) 150/72  Pulse: (!) 105  (!) 106 (!) 111  Resp: 20  16 (!) 26  Temp:  99.3 F (37.4 C)    TempSrc:  Oral    SpO2: 97%  96% 94%  Weight:      Height:       General: Overweight appearing lady.  Slightly does uncomfortable in bed due to reported shortness of breath as well as right-sided chest discomfort, however she is laying flat in bed, and also has slight tachypnea, does not have deep inspirations.  Speaking in full sentences Respiratory exam: Bilateral air entry vesicular without any expiratory wheezes, bilateral air entry is comparable Cardiovascular exam S1-S2 normal/tachycardia Abdomen all quadrants soft nontender Extremities warm without edema no focal motor deficit Patient is alert and awake and nontoxic-appearing. Data Reviewed:  Labs on Admission:  Results for orders placed or performed during the hospital encounter of 11/29/22 (from the past 24 hour(s))  Resp panel by RT-PCR (RSV, Flu A&B,  Covid) Anterior Nasal Swab     Status: None   Collection Time: 11/29/22  3:49 PM   Specimen: Anterior Nasal Swab  Result Value Ref Range   SARS Coronavirus 2 by RT PCR NEGATIVE NEGATIVE   Influenza A by PCR NEGATIVE NEGATIVE   Influenza B by PCR NEGATIVE NEGATIVE   Resp Syncytial Virus by PCR  NEGATIVE NEGATIVE  CBC with Differential     Status: Abnormal   Collection Time: 11/29/22  3:49 PM  Result Value Ref Range   WBC 12.4 (H) 4.0 - 10.5 K/uL   RBC 5.53 (H) 3.87 - 5.11 MIL/uL   Hemoglobin 14.8 12.0 - 15.0 g/dL   HCT 16.1 (H) 09.6 - 04.5 %   MCV 85.4 80.0 - 100.0 fL   MCH 26.8 26.0 - 34.0 pg   MCHC 31.4 30.0 - 36.0 g/dL   RDW 40.9 81.1 - 91.4 %   Platelets 238 150 - 400 K/uL   nRBC 0.0 0.0 - 0.2 %   Neutrophils Relative % 88 %   Neutro Abs 10.8 (H) 1.7 - 7.7 K/uL   Lymphocytes Relative 7 %   Lymphs Abs 0.9 0.7 - 4.0 K/uL   Monocytes Relative 5 %   Monocytes Absolute 0.6 0.1 - 1.0 K/uL   Eosinophils Relative 0 %   Eosinophils Absolute 0.0 0.0 - 0.5 K/uL   Basophils Relative 0 %   Basophils Absolute 0.1 0.0 - 0.1 K/uL   Immature Granulocytes 0 %   Abs Immature Granulocytes 0.05 0.00 - 0.07 K/uL  Protime-INR     Status: Abnormal   Collection Time: 11/29/22  3:49 PM  Result Value Ref Range   Prothrombin Time 15.4 (H) 11.4 - 15.2 seconds   INR 1.2 0.8 - 1.2  APTT     Status: None   Collection Time: 11/29/22  3:49 PM  Result Value Ref Range   aPTT 30 24 - 36 seconds  Urinalysis, w/ Reflex to Culture (Infection Suspected) -Urine, Clean Catch     Status: Abnormal   Collection Time: 11/29/22  3:49 PM  Result Value Ref Range   Specimen Source URINE, CLEAN CATCH    Color, Urine YELLOW YELLOW   APPearance CLEAR CLEAR   Specific Gravity, Urine 1.020 1.005 - 1.030   pH 7.0 5.0 - 8.0   Glucose, UA NEGATIVE NEGATIVE mg/dL   Hgb urine dipstick TRACE (A) NEGATIVE   Bilirubin Urine NEGATIVE NEGATIVE   Ketones, ur NEGATIVE NEGATIVE mg/dL   Protein, ur NEGATIVE NEGATIVE mg/dL   Nitrite NEGATIVE NEGATIVE   Leukocytes,Ua TRACE (A) NEGATIVE   Squamous Epithelial / HPF 6-10 0 - 5 /HPF   WBC, UA 0-5 0 - 5 WBC/hpf   RBC / HPF 0-5 0 - 5 RBC/hpf   Bacteria, UA RARE (A) NONE SEEN   Mucus PRESENT   Troponin I (High Sensitivity)     Status: Abnormal   Collection Time: 11/29/22   3:49 PM  Result Value Ref Range   Troponin I (High Sensitivity) 33 (H) <18 ng/L  Brain natriuretic peptide     Status: Abnormal   Collection Time: 11/29/22  3:57 PM  Result Value Ref Range   B Natriuretic Peptide 242.7 (H) 0.0 - 100.0 pg/mL  I-Stat Lactic Acid, ED     Status: Abnormal   Collection Time: 11/29/22  4:00 PM  Result Value Ref Range   Lactic Acid, Venous 2.9 (HH) 0.5 - 1.9 mmol/L   Comment NOTIFIED PHYSICIAN   Comprehensive metabolic panel  Status: Abnormal   Collection Time: 11/29/22  4:55 PM  Result Value Ref Range   Sodium 135 135 - 145 mmol/L   Potassium 4.6 3.5 - 5.1 mmol/L   Chloride 105 98 - 111 mmol/L   CO2 18 (L) 22 - 32 mmol/L   Glucose, Bld 116 (H) 70 - 99 mg/dL   BUN 9 8 - 23 mg/dL   Creatinine, Ser 1.91 (H) 0.44 - 1.00 mg/dL   Calcium 9.5 8.9 - 47.8 mg/dL   Total Protein 7.6 6.5 - 8.1 g/dL   Albumin 3.8 3.5 - 5.0 g/dL   AST 38 15 - 41 U/L   ALT 19 0 - 44 U/L   Alkaline Phosphatase 64 38 - 126 U/L   Total Bilirubin 1.3 (H) 0.3 - 1.2 mg/dL   GFR, Estimated 44 (L) >60 mL/min   Anion gap 12 5 - 15  Troponin I (High Sensitivity)     Status: Abnormal   Collection Time: 11/29/22  5:57 PM  Result Value Ref Range   Troponin I (High Sensitivity) 28 (H) <18 ng/L  I-Stat Lactic Acid, ED     Status: Abnormal   Collection Time: 11/29/22  6:53 PM  Result Value Ref Range   Lactic Acid, Venous 7.6 (HH) 0.5 - 1.9 mmol/L   Comment NOTIFIED PHYSICIAN    *Note: Due to a large number of results and/or encounters for the requested time period, some results have not been displayed. A complete set of results can be found in Results Review.   Basic Metabolic Panel: Recent Labs  Lab 11/29/22 1655  NA 135  K 4.6  CL 105  CO2 18*  GLUCOSE 116*  BUN 9  CREATININE 1.30*  CALCIUM 9.5   Liver Function Tests: Recent Labs  Lab 11/29/22 1655  AST 38  ALT 19  ALKPHOS 64  BILITOT 1.3*  PROT 7.6  ALBUMIN 3.8   No results for input(s): "LIPASE", "AMYLASE" in the  last 168 hours. No results for input(s): "AMMONIA" in the last 168 hours. CBC: Recent Labs  Lab 11/29/22 1549  WBC 12.4*  NEUTROABS 10.8*  HGB 14.8  HCT 47.2*  MCV 85.4  PLT 238   Cardiac Enzymes: Recent Labs  Lab 11/29/22 1549 11/29/22 1757  TROPONINIHS 33* 28*    BNP (last 3 results) No results for input(s): "PROBNP" in the last 8760 hours. CBG: No results for input(s): "GLUCAP" in the last 168 hours.  Radiological Exams on Admission:  DG Chest Port 1 View  Result Date: 11/29/2022 CLINICAL DATA:  Shortness of breath EXAM: PORTABLE CHEST 1 VIEW COMPARISON:  11/02/2017 FINDINGS: Patchy bilateral mid/lower lung opacities, suspicious for pneumonia. No pleural effusion or pneumothorax. The heart is top-normal in size. Median sternotomy. IMPRESSION: Multifocal pneumonia, lower lobe predominant. Electronically Signed   By: Charline Bills M.D.   On: 11/29/2022 17:20    EKG: Independently reviewed. Sinus tachy.   Assessment and Plan: * Pneumonia Multifocal biatleral lower lobe predominant. White count of 12.4 associated with slight tachycardia as well as slight hypoxia per report.  Meets criteria for sepsis, got 500 cc of fluid in the ER bolus as well as 150 cc/h of running fluids at this time.  Lactic acid has trended up, however clinically patient appears nontoxic.  Therefore I think that this lactic acid elevation may be spurious and I have discussed with RN and we will repeat lactic acid right now along with troponin and an ABG, given patient's persistent symptoms.  Overall I think  patient is having slight pleuritic chest discomfort that is causing her to have rapid shallow breathing.  I will order incentive spirometry, also give Tylenol.  Blood cultures were drawn by the ER attending.  Treat with ceftriaxone and azithromycin antibiotics started in the ER  Atrial fibrillation, chronic (HCC) See cardio note 10/10/2022. Diagnosed with "persitent a-flutter". C.w. tykosin. Rate  control meds ordered. Hold losartan this evening. Till vital stability is definitely establishe.d  Advanced care planning/counseling discussion Patient wants to be DNR. Has DNR form at home. However, I dont see it here. Will change order.  Sjogren's syndrome (HCC) C.w. restasis eye drop  (HFpEF) heart failure with preserved ejection fraction (HCC) C.w. lasix. Monitor I/o . See overview.  Currently euvolemic, however I am being careful with the fluids given her respiratory status and diagnosis of heart failure in the past.  MDD (major depressive disorder), recurrent episode (HCC) Patient on several BZDs.c.w. home meds in this regard. Outpatient taper. Psych dr. Is Dr. Tenny Craw  I reviewed home medications with the patient per her memory and ordered medications accordingly.  Please follow up pharmacy home med collection and make any adjustments if needed.    Advance Care Planning:   Code Status: Do not attempt resuscitation (DNR) PRE-ARREST INTERVENTIONS DESIRED disucssed with patient directly.  Consults: none at this time.   Family Communication: per patient.  Severity of Illness: The appropriate patient status for this patient is INPATIENT. Inpatient status is judged to be reasonable and necessary in order to provide the required intensity of service to ensure the patient's safety. The patient's presenting symptoms, physical exam findings, and initial radiographic and laboratory data in the context of their chronic comorbidities is felt to place them at high risk for further clinical deterioration. Furthermore, it is not anticipated that the patient will be medically stable for discharge from the hospital within 2 midnights of admission.   * I certify that at the point of admission it is my clinical judgment that the patient will require inpatient hospital care spanning beyond 2 midnights from the point of admission due to high intensity of service, high risk for further deterioration and high  frequency of surveillance required.*  Author: Nolberto Hanlon, MD 11/29/2022 7:39 PM  For on call review [email or web address].

## 2022-11-29 NOTE — Progress Notes (Signed)
Pt refused ABG. RN present at bedside

## 2022-11-29 NOTE — ED Triage Notes (Signed)
Pt to the ed from home via ems  with a CC of sob x 3 days getting worst today. Pt was 85% for ems and was placed on 2 LPM and started feeling better. Pt relays coughing with congestion. Fever, chills, n/v.

## 2022-11-29 NOTE — ED Provider Notes (Signed)
Assumed care at 4 today from Dr. Wilkie Aye.  Patient with URI symptoms that started after going to a wedding over the weekend that have worsened today resulting in shortness of breath weakness and a productive cough with low-grade fevers at home.  When EMS arrived today patient was hypoxic requiring oxygen and is tachypneic here and ill-appearing.  I independently interpreted patient's labs and lactate today is elevated at 2.9, BNP elevated 242, COVID and viral panel is negative, leukocytosis of 12 with normal hemoglobin and UA without signs of infection.  I have independently visualized and interpreted pt's images today. Chest x-ray today with multifocal pneumonia.  Patient started on IV antibiotics for pneumonia and admitted as she has respiratory failure with hypoxia requiring oxygen.  Discussed with this patient and she is comfortable with this plan.  Consulted the hospitalist for admission.   Gwyneth Sprout, MD 11/29/22 1753

## 2022-11-29 NOTE — ED Notes (Signed)
This RN assisted patient to bedpan. Urine did not make it in bedpan. Unable to provide accurate urine amount.

## 2022-11-29 NOTE — Assessment & Plan Note (Signed)
Patient on several BZDs.c.w. home meds in this regard. Outpatient taper. Psych dr. Is Dr. Tenny Craw

## 2022-11-29 NOTE — Assessment & Plan Note (Signed)
See cardio note 10/10/2022. Diagnosed with "persitent a-flutter". C.w. tykosin. Rate control meds ordered. Hold losartan this evening. Till vital stability is definitely establishe.d

## 2022-11-29 NOTE — ED Notes (Signed)
Pt ambulated to the RR and wheeled back to room. Pt did well when walking. I did not know at the time pt was here for SOB pt stated that she was just hurting. Nurse knew and saw me walk pt to RR.

## 2022-11-29 NOTE — Assessment & Plan Note (Signed)
C.w. restasis eye drop

## 2022-11-29 NOTE — ED Notes (Addendum)
ED TO INPATIENT HANDOFF REPORT  ED Nurse Name and Phone #: Mignon Pine RN, MSN [phone]  S Name/Age/Gender Alexandria Leach 72 y.o. female Room/Bed: 002C/002C  Code Status Do not attempt resuscitation (DNR) PRE-ARREST INTERVENTIONS DESIRED   Home/SNF/Other Home Patient oriented to: self, place, time, and situation Is this baseline? Yes   Triage Complete: Triage complete  Chief Complaint Pneumonia [J18.9]  Triage Note Pt to the ed from home via ems  with a CC of sob x 3 days getting worst today. Pt was 85% for ems and was placed on 2 LPM and started feeling better. Pt relays coughing with congestion. Fever, chills, n/v.     Allergies Allergies  Allergen Reactions   Cymbalta [Duloxetine Hcl] Other (See Comments)    tachycardia   Statins Nausea Only and Other (See Comments)    Muscle cramps also   Sulfa Antibiotics Nausea And Vomiting    Level of Care/Admitting Diagnosis ED Disposition     ED Disposition  Admit   Condition  --   Comment  Hospital Area: [facility] [[id number]]  Level of Care: Telemetry Medical [104]  May admit patient to [facility] or [facility] if equivalent level of care is available:: No  Covid Evaluation: Confirmed COVID Negative  Diagnosis: Pneumonia [227785]  Admitting Physician: Nolberto Hanlon [[id number]]  Attending Physician: Nolberto Hanlon [[id number]]  Certification:: I certify this patient will need inpatient services for at least 2 midnights  Expected Medical Readiness: 12/02/2022          B Medical/Surgery History Past Medical History:  Diagnosis Date   Ankle fracture, left 02/19/2015   from a fall   Anxiety    Cataract 2014   corrected with surgery   CHF (congestive heart failure) (HCC)    CKD (chronic kidney disease) stage 3, GFR 30-59 ml/min [facility])    saw nephrologist Dr Stephens Shire   COVID-19 09/21/2021   DDD (degenerative disc disease), cervical    Depression    Fibromyalgia    Glaucoma    s/p surgery,  sees ophtho Q6 mo   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 Thought benign.    Osteoarthritis    shoulders and knees, not RA per Dr Corliss Skains, positive ANA, positive Ro   Osteopenia 09/18/2015   DEXA T -1.1 hip, -0.2 spine 08/2015    Personal history of urinary calculi latest 2014   Pneumonia 12/02/2011   PONV (postoperative nausea and vomiting)    Refusal of blood transfusions as patient is Jehovah's Witness    Rheumatic heart disease 1980   s/p mitral valve repair 1980   Sjogren's syndrome (HCC)    Trimalleolar fracture of left ankle 02/23/2015   Past Surgical History:  Procedure Laterality Date   BREAST BIOPSY Right 2006   benign   CARDIOVERSION N/A 07/18/2020   Procedure: CARDIOVERSION;  Surgeon: Parke Poisson, MD;  Location: [facility];  Service: Cardiovascular;  Laterality: N/A;   CHOLECYSTECTOMY  11/27/2011   Procedure: LAPAROSCOPIC CHOLECYSTECTOMY WITH INTRAOPERATIVE CHOLANGIOGRAM;  Surgeon: Ernestene Mention, MD;  Location: [facility] OR;  Service: General;  Laterality: N/A;  laparoscopic cholecystectomy with choleangiogram umbilical hernia repair   COLONOSCOPY  07/2014   WNL [facility]ily[facility] Fireman)   COLONOSCOPY  08/2019   3 TAs, rpt 3 yrs ([facility])   dexa  08/2012   normal per patient - no records available   ESOPHAGOGASTRODUODENOSCOPY  08/2019   reactive gastropathy, neg H pylori ([facility])   EYE SURGERY Bilateral 2014   cataract removal   MITRAL VALVE REPAIR  1980   open heart   ORIF ANKLE FRACTURE Left 02/26/2015   Procedure: OPEN REDUCTION INTERNAL FIXATION (ORIF) LEFT TRIMALLEOLAR ANKLE FRACTURE;  Surgeon: Tarry Kos, MD;   Location: [facility] OR;  Service: Orthopedics;  Laterality: Left;   TUBAL LIGATION  1980   UMBILICAL HERNIA REPAIR  11/27/2011   Procedure: HERNIA REPAIR UMBILICAL ADULT;  Surgeon: Ernestene Mention, MD;  Location: [facility] OR;  Service: General;  Laterality: N/A;   VAGINAL HYSTERECTOMY  1992   for fibroids -- partial, ovaries remain     A IV Location/Drains/Wounds Patient Lines/Drains/Airways Status     Active Line/Drains/Airways     Name Placement date Placement time Site Days   Peripheral IV 11/29/22 20 G 1" Anterior;Left;Proximal Forearm 11/29/22  1659  Forearm  less than 1            Intake/Output Last 24 hours No intake or output data in the 24 hours ending 11/29/22 1914  Labs/Imaging Results for orders placed or performed during the hospital encounter of 11/29/22 (from the past 48 hour(s))  Resp panel by RT-PCR (RSV, Flu A&B, Covid) Anterior Nasal Swab     Status: None   Collection Time: 11/29/22  3:49 PM   Specimen: Anterior Nasal Swab  Result Value Ref Range   SARS Coronavirus 2 by RT PCR NEGATIVE NEGATIVE   Influenza A by PCR NEGATIVE NEGATIVE   Influenza B by PCR NEGATIVE NEGATIVE    Comment: (NOTE) The Xpert Xpress SARS-CoV-2/FLU/RSV plus assay is intended as an aid in the diagnosis of influenza from Nasopharyngeal swab specimens and should not be used as a sole basis for treatment. Nasal washings and aspirates are unacceptable for Xpert Xpress SARS-CoV-2/FLU/RSV testing.  Fact Sheet for Patients: BloggerCourse.com  Fact Sheet for Healthcare Providers: SeriousBroker.it  This test is not yet approved or cleared by the [place] FDA and has been authorized for detection and/or diagnosis of SARS-CoV-2 by FDA under an Emergency Use Authorization (EUA). This EUA will remain in effect (meaning this test can be used) for the duration of the COVID-19 declaration under Section 564(b)(1) of the Act, 21 U.S.C. section  360bbb-3(b)(1), unless the authorization is terminated or revoked.     Resp Syncytial Virus by PCR NEGATIVE NEGATIVE    Comment: (NOTE) Fact Sheet for Patients: BloggerCourse.com  Fact Sheet for Healthcare Providers: SeriousBroker.it  This test is not yet approved or cleared by the [place] FDA and has been authorized for detection and/or diagnosis of SARS-CoV-2 by FDA under an Emergency Use Authorization (EUA). This EUA will remain in effect (meaning this test can be used) for the duration of the COVID-19 declaration under Section 564(b)(1) of the Act, 21 U.S.C. section 360bbb-3(b)(1), unless the authorization is terminated or revoked.  Performed at [facility], [address] [place], [place] [address]   CBC with Differential     Status: Abnormal   Collection Time: 11/29/22  3:49 PM  Result Value Ref Range   WBC 12.4 (H) 4.0 - 10.5 K/uL   RBC 5.53 (H) 3.87 -  5.11 MIL/uL   Hemoglobin 14.8 12.0 - 15.0 g/dL   HCT 78.2 (H) 95.6 - 21.3 %   MCV 85.4 80.0 - 100.0 fL   MCH 26.8 26.0 - 34.0 pg   MCHC 31.4 30.0 - 36.0 g/dL   RDW 08.6 57.8 - 46.9 %   Platelets 238 150 - 400 K/uL   nRBC 0.0 0.0 - 0.2 %   Neutrophils Relative % 88 %   Neutro Abs 10.8 (H) 1.7 - 7.7 K/uL   Lymphocytes Relative 7 %   Lymphs Abs 0.9 0.7 - 4.0 K/uL   Monocytes Relative 5 %   Monocytes Absolute 0.6 0.1 - 1.0 K/uL   Eosinophils Relative 0 %   Eosinophils Absolute 0.0 0.0 - 0.5 K/uL   Basophils Relative 0 %   Basophils Absolute 0.1 0.0 - 0.1 K/uL   Immature Granulocytes 0 %   Abs Immature Granulocytes 0.05 0.00 - 0.07 K/uL    Comment: Performed at [facility], [address] [place], [place] [address]  Protime-INR     Status: Abnormal   Collection Time: 11/29/22  3:49 PM  Result Value Ref Range   Prothrombin Time 15.4 (H) 11.4 - 15.2 seconds   INR 1.2 0.8 - 1.2    Comment: (NOTE) INR goal varies based on device and disease  states. Performed at [facility], [address] [place], [place] [address]   APTT     Status: None   Collection Time: 11/29/22  3:49 PM  Result Value Ref Range   aPTT 30 24 - 36 seconds    Comment: Performed at [facility], [address], [place], [place] [address]  Urinalysis, w/ Reflex to Culture (Infection Suspected) -Urine, Clean Catch     Status: Abnormal   Collection Time: 11/29/22  3:49 PM  Result Value Ref Range   Specimen Source URINE, CLEAN CATCH    Color, Urine YELLOW YELLOW   APPearance CLEAR CLEAR   Specific Gravity, Urine 1.020 1.005 - 1.030   pH 7.0 5.0 - 8.0   Glucose, UA NEGATIVE NEGATIVE mg/dL   Hgb urine dipstick TRACE (A) NEGATIVE   Bilirubin Urine NEGATIVE NEGATIVE   Ketones, ur NEGATIVE NEGATIVE mg/dL   Protein, ur NEGATIVE NEGATIVE mg/dL   Nitrite NEGATIVE NEGATIVE   Leukocytes,Ua TRACE (A) NEGATIVE   Squamous Epithelial / HPF 6-10 0 - 5 /HPF   WBC, UA 0-5 0 - 5 WBC/hpf    Comment: Reflex urine culture not performed if WBC <=10, OR if Squamous epithelial cells >5. If Squamous epithelial cells >5, suggest recollection.   RBC / HPF 0-5 0 - 5 RBC/hpf   Bacteria, UA RARE (A) NONE SEEN   Mucus PRESENT     Comment: Performed at [facility], [address], [place], [place] [address]  Troponin I (High Sensitivity)     Status: Abnormal   Collection Time: 11/29/22  3:49 PM  Result Value Ref Range   Troponin I (High Sensitivity) 33 (H) <18 ng/L    Comment: (NOTE) Elevated high sensitivity troponin I (hsTnI) values and significant  changes across serial measurements may suggest ACS but many other  chronic and acute conditions are known to elevate hsTnI results.  Refer to the "Links" section for chest pain algorithms and additional  guidance. Performed at [facility], [address], [place], [place] [address]   Brain natriuretic peptide     Status: Abnormal   Collection Time: 11/29/22  3:57 PM  Result Value Ref Range   B  Natriuretic Peptide 242.7 (H) 0.0 - 100.0 pg/mL    Comment: Performed at [facility], [address] [place], [place] [address]  I-Stat Lactic Acid, ED     Status: Abnormal   Collection Time: 11/29/22  4:00 PM  Result Value Ref Range   Lactic Acid, Venous 2.9 (HH) 0.5 - 1.9 mmol/L   Comment NOTIFIED PHYSICIAN   Comprehensive metabolic panel     Status: Abnormal   Collection Time: 11/29/22  4:55 PM  Result Value Ref Range   Sodium 135 135 - 145 mmol/L   Potassium 4.6 3.5 - 5.1 mmol/L    Comment: HEMOLYSIS AT THIS LEVEL MAY AFFECT RESULT   Chloride 105 98 - 111 mmol/L   CO2 18 (L) 22 - 32 mmol/L   Glucose, Bld 116 (H) 70 - 99 mg/dL    Comment: Glucose reference range applies only to samples taken after fasting for at least 8 hours.   BUN 9 8 - 23 mg/dL   Creatinine, Ser 9.14 (H) 0.44 - 1.00 mg/dL   Calcium 9.5 8.9 - 78.2 mg/dL   Total Protein 7.6 6.5 - 8.1 g/dL   Albumin 3.8 3.5 - 5.0 g/dL   AST 38 15 - 41 U/L    Comment: HEMOLYSIS AT THIS LEVEL MAY AFFECT RESULT   ALT 19 0 - 44 U/L    Comment: HEMOLYSIS AT THIS LEVEL MAY AFFECT RESULT   Alkaline Phosphatase 64 38 - 126 U/L   Total Bilirubin 1.3 (H) 0.3 - 1.2 mg/dL    Comment: HEMOLYSIS AT THIS LEVEL MAY AFFECT RESULT   GFR, Estimated 44 (L) >60 mL/min    Comment: (NOTE) Calculated using the CKD-EPI Creatinine Equation (2021)    Anion gap 12 5 - 15    Comment: Performed at [facility], [address], [place], [place] [address]  Troponin I (High Sensitivity)     Status: Abnormal   Collection Time: 11/29/22  5:57 PM  Result Value Ref Range   Troponin I (High Sensitivity) 28 (H) <18 ng/L    Comment: (NOTE) Elevated high sensitivity troponin I (hsTnI) values and significant  changes across serial measurements may suggest ACS but many other  chronic and acute conditions are known to elevate hsTnI results.  Refer to the "Links" section for chest pain algorithms and additional  guidance. Performed at [facility], [address] [place], [place] [address]   I-Stat Lactic Acid, ED     Status: Abnormal   Collection Time: 11/29/22  6:53 PM  Result Value Ref Range   Lactic Acid, Venous 7.6 (HH) 0.5 - 1.9 mmol/L   Comment NOTIFIED PHYSICIAN    DG Chest Port 1 View  Result Date: 11/29/2022 CLINICAL DATA:  Shortness of breath EXAM: PORTABLE CHEST 1 VIEW COMPARISON:  11/02/2017 FINDINGS: Patchy bilateral mid/lower lung opacities, suspicious for pneumonia. No pleural effusion or pneumothorax. The heart is top-normal in size. Median sternotomy. IMPRESSION: Multifocal pneumonia, lower lobe predominant. Electronically Signed   By: Charline Bills M.D.   On: 11/29/2022 17:20    Pending Labs Unresulted Labs (From admission, onward)     Start     Ordered   12/06/22 0500  Creatinine, serum  (enoxaparin (LOVENOX)    CrCl >/= 30 ml/min)  Weekly,   R     Comments: while on enoxaparin therapy    11/29/22 1908   11/30/22 0500  APTT  Tomorrow morning,   R        11/29/22 1908   11/30/22 0500  Protime-INR  Tomorrow morning,   R        11/29/22 1908   11/30/22 0500  Basic metabolic panel  Tomorrow morning,   R        11/29/22 1908   11/30/22 0500  CBC  Tomorrow morning,   R        11/29/22 1908   11/29/22 1907  CBC  (enoxaparin (LOVENOX)    CrCl >/= 30 ml/min)  Once,   R       Comments: Baseline for enoxaparin therapy IF NOT ALREADY DRAWN.  Notify MD if PLT < 100 K.    11/29/22 1908   11/29/22 1907  Creatinine, serum  (enoxaparin (LOVENOX)    CrCl >/= 30 ml/min)  Once,   R       Comments: Baseline for enoxaparin therapy IF NOT ALREADY DRAWN.    11/29/22 1908   11/29/22 1907  HIV Antibody (routine testing w rflx)  (HIV Antibody (Routine testing w reflex) panel)  Once,   R        11/29/22 1908   11/29/22 1549  Blood Culture (routine x 2)  (Septic presentation on arrival (screening labs, nursing and treatment orders for obvious sepsis))  BLOOD CULTURE X 2,   STAT      11/29/22 1549             Vitals/Pain Today's Vitals   11/29/22 1545 11/29/22 1622 11/29/22 1700 11/29/22 1830  BP: (!) 146/87  (!) 163/72 (!) 150/72  Pulse: (!) 105  (!) 106 (!) 111  Resp: 20  16 (!) 26  Temp:  99.3 F (37.4 C)    TempSrc:  Oral    SpO2: 97%  96% 94%  Weight:      Height:      PainSc:        Isolation Precautions No active isolations  Medications Medications  lactated ringers infusion ( Intravenous New Bag/Given 11/29/22 1835)  cefTRIAXone (ROCEPHIN) 1 g in sodium chloride 0.9 % 100 mL IVPB (1 g Intravenous New Bag/Given 11/29/22 1858)  azithromycin (ZITHROMAX) 500 mg in sodium chloride 0.9 % 250 mL IVPB (has no administration in time range)  enoxaparin (LOVENOX) injection 40 mg (has no administration in time range)  acetaminophen (TYLENOL) tablet 650 mg (has no administration in time range)    Or  acetaminophen (TYLENOL) suppository 650 mg (has no administration in time range)  polyethylene glycol (MIRALAX / GLYCOLAX) packet 17 g (has no administration in time range)  sodium chloride flush (NS) 0.9 % injection 3 mL (has no administration in time range)  lactated ringers bolus 500 mL (0 mLs Intravenous Stopped 11/29/22 1835)    Mobility walks with device     Focused Assessments   R Recommendations: See Admitting Provider Note  Report given to:   Additional Notes:

## 2022-11-30 DIAGNOSIS — I509 Heart failure, unspecified: Secondary | ICD-10-CM

## 2022-11-30 DIAGNOSIS — J189 Pneumonia, unspecified organism: Secondary | ICD-10-CM | POA: Diagnosis not present

## 2022-11-30 LAB — BASIC METABOLIC PANEL
Anion gap: 13 (ref 5–15)
BUN: 9 mg/dL (ref 8–23)
CO2: 20 mmol/L — ABNORMAL LOW (ref 22–32)
Calcium: 9.2 mg/dL (ref 8.9–10.3)
Chloride: 105 mmol/L (ref 98–111)
Creatinine, Ser: 1.18 mg/dL — ABNORMAL HIGH (ref 0.44–1.00)
GFR, Estimated: 50 mL/min — ABNORMAL LOW (ref >60)
Glucose, Bld: 128 mg/dL — ABNORMAL HIGH (ref 70–99)
Potassium: 4 mmol/L (ref 3.5–5.1)
Sodium: 138 mmol/L (ref 135–145)

## 2022-11-30 LAB — CBC
HCT: 34.9 % — ABNORMAL LOW (ref 36.0–46.0)
Hemoglobin: 11 g/dL — ABNORMAL LOW (ref 12.0–15.0)
MCH: 26.8 pg (ref 26.0–34.0)
MCHC: 31.5 g/dL (ref 30.0–36.0)
MCV: 85.1 fL (ref 80.0–100.0)
Platelets: 211 10*3/uL (ref 150–400)
RBC: 4.1 MIL/uL (ref 3.87–5.11)
RDW: 14.5 % (ref 11.5–15.5)
WBC: 14.5 10*3/uL — ABNORMAL HIGH (ref 4.0–10.5)
nRBC: 0 % (ref 0.0–0.2)

## 2022-11-30 LAB — PROTIME-INR
INR: 1.5 — ABNORMAL HIGH (ref 0.8–1.2)
Prothrombin Time: 17.8 s — ABNORMAL HIGH (ref 11.4–15.2)

## 2022-11-30 LAB — MAGNESIUM: Magnesium: 1.8 mg/dL (ref 1.7–2.4)

## 2022-11-30 LAB — TROPONIN I (HIGH SENSITIVITY)
Troponin I (High Sensitivity): 63 ng/L — ABNORMAL HIGH (ref <18)
Troponin I (High Sensitivity): 66 ng/L — ABNORMAL HIGH (ref <18)

## 2022-11-30 LAB — APTT: aPTT: 42 s — ABNORMAL HIGH (ref 24–36)

## 2022-11-30 MED ORDER — SODIUM CHLORIDE 0.9 % IV SOLN
100.0000 mg | Freq: Two times a day (BID) | INTRAVENOUS | Status: DC
  Administered 2022-11-30 – 2022-12-01 (×3): 100 mg via INTRAVENOUS
  Filled 2022-11-30 (×4): qty 100

## 2022-11-30 MED ORDER — FUROSEMIDE 10 MG/ML IJ SOLN
40.0000 mg | Freq: Once | INTRAMUSCULAR | Status: AC
  Administered 2022-11-30: 40 mg via INTRAVENOUS
  Filled 2022-11-30: qty 4

## 2022-11-30 NOTE — Progress Notes (Signed)
PROGRESS NOTE    Alexandria Leach  ZOX:[id number] DOB: [birth year] DOA: 11/29/2022 PCP: Eustaquio Boyden, MD  [name] with history of chronic diastolic CHF, remote PE, chronic A-fib, Sjogren's disease, depression presented to the ED with cough congestion shortness of breath and pleuritic right-sided chest pain for 5 to 6 days, followed by nausea and vomiting yesterday. -In the ED she was noted to be mildly hypoxic, temp 99.3, labs noted WBC of 12.4 with mild lactic acidosis, BNP 242, creatinine 1.3, x-ray noted multifocal pneumonia   Subjective: -Short of breath this morning  Assessment and Plan:  Multifocal pneumonia Sepsis, POA -Discontinue IV fluids, clinically appears mildly overloaded at this time -Continue IV ceftriaxone and azithromycin -Flu and COVID PCR are negative, blood cultures pending -No risk factors for aspiration  Acute on chronic diastolic CHF -Last echo 10/23 with EF 60-65%, normal RV and moderate aortic regurgitation -Is mildly volume overloaded, Lasix IV x 1 now, continue losartan -Discontinue IV fluids  Atrial fibrillation, chronic (HCC) See cardio note 10/10/2022. Diagnosed with "persitent a-flutter".  -Continue Tikosyn, diltiazem, metoprolol and Eliquis  Sjogren's syndrome (HCC) C.w. restasis eye drop -Follow-up with rheumatology  MDD (major depressive disorder), recurrent episode (HCC) -On Xanax, continued  DVT prophylaxis: Apixaban Code Status: DNR Family Communication: None present Disposition Plan: Home likely 48 hours  Consultants:    Procedures:   Antimicrobials:    Objective: Vitals:   11/30/22 0737 11/30/22 0745 11/30/22 0830 11/30/22 0915  BP:  136/69 (!) 157/61 (!) 147/57  Pulse:  91 92 90  Resp:  15 15 15   Temp: 98.4 F (36.9 C)     TempSrc: Oral     SpO2:  100% 100% 100%  Weight:      Height:        Intake/Output Summary (Last 24 hours) at 11/30/2022 0943 Last data filed at 11/30/2022 0913 Gross per 24 hour  Intake  2334.32 ml  Output 400 ml  Net 1934.32 ml   Filed Weights   11/29/22 1502  Weight: 88 kg    Examination:  General exam: Appears calm and comfortable  Respiratory system: Clear to auscultation Cardiovascular system: S1 & S2 heard, RRR.  Abd: nondistended, soft and nontender.Normal bowel sounds heard. Central nervous system: Alert and oriented. No focal neurological deficits. Extremities: no edema Skin: No rashes Psychiatry:  Mood & affect appropriate.     Data Reviewed:   CBC: Recent Labs  Lab 11/29/22 1549 11/30/22 0057  WBC 12.4* 14.5*  NEUTROABS 10.8*  --   HGB 14.8 11.0*  HCT 47.2* 34.9*  MCV 85.4 85.1  PLT 238 211   Basic Metabolic Panel: Recent Labs  Lab 11/29/22 1655 11/30/22 0057  NA 135 138  K 4.6 4.0  CL 105 105  CO2 18* 20*  GLUCOSE 116* 128*  BUN 9 9  CREATININE 1.30* 1.18*  CALCIUM 9.5 9.2   GFR: Estimated Creatinine Clearance: 44.8 mL/min (A) (by C-G formula based on SCr of 1.18 mg/dL (H)). Liver Function Tests: Recent Labs  Lab 11/29/22 1655  AST 38  ALT 19  ALKPHOS 64  BILITOT 1.3*  PROT 7.6  ALBUMIN 3.8   No results for input(s): "LIPASE", "AMYLASE" in the last 168 hours. No results for input(s): "AMMONIA" in the last 168 hours. Coagulation Profile: Recent Labs  Lab 11/29/22 1549 11/30/22 0057  INR 1.2 1.5*   Cardiac Enzymes: No results for input(s): "CKTOTAL", "CKMB", "CKMBINDEX", "TROPONINI" in the last 168 hours. BNP (last 3 results) No results for input(s): "PROBNP"  in the last 8760 hours. HbA1C: No results for input(s): "HGBA1C" in the last 72 hours. CBG: No results for input(s): "GLUCAP" in the last 168 hours. Lipid Profile: No results for input(s): "CHOL", "HDL", "LDLCALC", "TRIG", "CHOLHDL", "LDLDIRECT" in the last 72 hours. Thyroid Function Tests: No results for input(s): "TSH", "T4TOTAL", "FREET4", "T3FREE", "THYROIDAB" in the last 72 hours. Anemia Panel: No results for input(s): "VITAMINB12", "FOLATE",  "FERRITIN", "TIBC", "IRON", "RETICCTPCT" in the last 72 hours. Urine analysis:    Component Value Date/Time   COLORURINE YELLOW 11/29/2022 1549   APPEARANCEUR CLEAR 11/29/2022 1549   LABSPEC 1.020 11/29/2022 1549   PHURINE 7.0 11/29/2022 1549   GLUCOSEU NEGATIVE 11/29/2022 1549   HGBUR TRACE (A) 11/29/2022 1549   BILIRUBINUR NEGATIVE 11/29/2022 1549   BILIRUBINUR negative 07/16/2018 1017   KETONESUR NEGATIVE 11/29/2022 1549   PROTEINUR NEGATIVE 11/29/2022 1549   UROBILINOGEN 0.2 08/07/2018 1249   NITRITE NEGATIVE 11/29/2022 1549   LEUKOCYTESUR TRACE (A) 11/29/2022 1549   Sepsis Labs: @LABRCNTIP (procalcitonin:4,lacticidven:4)  ) Recent Results (from the past 240 hour(s))  Resp panel by RT-PCR (RSV, Flu A&B, Covid) Anterior Nasal Swab     Status: None   Collection Time: 11/29/22  3:49 PM   Specimen: Anterior Nasal Swab  Result Value Ref Range Status   SARS Coronavirus 2 by RT PCR NEGATIVE NEGATIVE Final   Influenza A by PCR NEGATIVE NEGATIVE Final   Influenza B by PCR NEGATIVE NEGATIVE Final    Comment: (NOTE) The Xpert Xpress SARS-CoV-2/FLU/RSV plus assay is intended as an aid in the diagnosis of influenza from Nasopharyngeal swab specimens and should not be used as a sole basis for treatment. Nasal washings and aspirates are unacceptable for Xpert Xpress SARS-CoV-2/FLU/RSV testing.  Fact Sheet for Patients: BloggerCourse.com  Fact Sheet for Healthcare Providers: SeriousBroker.it  This test is not yet approved or cleared by the [place] FDA and has been authorized for detection and/or diagnosis of SARS-CoV-2 by FDA under an Emergency Use Authorization (EUA). This EUA will remain in effect (meaning this test can be used) for the duration of the COVID-19 declaration under Section 564(b)(1) of the Act, 21 U.S.C. section 360bbb-3(b)(1), unless the authorization is terminated or revoked.     Resp Syncytial Virus by  PCR NEGATIVE NEGATIVE Final    Comment: (NOTE) Fact Sheet for Patients: BloggerCourse.com  Fact Sheet for Healthcare Providers: SeriousBroker.it  This test is not yet approved or cleared by the [place] FDA and has been authorized for detection and/or diagnosis of SARS-CoV-2 by FDA under an Emergency Use Authorization (EUA). This EUA will remain in effect (meaning this test can be used) for the duration of the COVID-19 declaration under Section 564(b)(1) of the Act, 21 U.S.C. section 360bbb-3(b)(1), unless the authorization is terminated or revoked.  Performed at [facility], [address], [place], [place] [address]   Blood Culture (routine x 2)     Status: None (Preliminary result)   Collection Time: 11/29/22  3:51 PM   Specimen: BLOOD RIGHT ARM  Result Value Ref Range Status   Specimen Description BLOOD RIGHT ARM  Final   Special Requests   Final    BOTTLES DRAWN AEROBIC AND ANAEROBIC Blood Culture adequate volume   Culture   Final    NO GROWTH < 24 HOURS Performed at [facility], [address], [place], [place] [address]    Report Status PENDING  Incomplete     Radiology Studies: DG Chest Port 1 View  Result Date:  11/29/2022 CLINICAL DATA:  Shortness of breath EXAM: PORTABLE CHEST 1 VIEW COMPARISON:  11/02/2017 FINDINGS: Patchy bilateral mid/lower lung opacities, suspicious for pneumonia. No pleural effusion or pneumothorax. The heart is top-normal in size. Median sternotomy. IMPRESSION: Multifocal pneumonia, lower lobe predominant. Electronically Signed   By: Charline Bills M.D.   On: 11/29/2022 17:20     Scheduled Meds:  apixaban  5 mg Oral BID   cholecalciferol  1,000 Units Oral Daily   cyanocobalamin  1,000 mcg Oral Weekly   cycloSPORINE  1 drop Both Eyes BID   diltiazem  360 mg Oral Daily   docusate sodium  100 mg Oral Daily   dofetilide  250 mcg Oral BID   ezetimibe  10 mg Oral QHS    gabapentin  300 mg Oral Q24H   gabapentin  600 mg Oral BID   metoprolol tartrate  75 mg Oral BID   pantoprazole  40 mg Oral Daily   potassium chloride  10 mEq Oral Daily   sodium chloride flush  3 mL Intravenous Q12H   thiamine  100 mg Oral Weekly   Continuous Infusions:  azithromycin 500 mg (11/30/22 0916)   cefTRIAXone (ROCEPHIN)  IV Stopped (11/30/22 0913)     LOS: 1 day    Time spent:    Zannie Cove, MD [facility]   11/30/2022, 9:43 AM

## 2022-11-30 NOTE — ED Notes (Signed)
ED TO INPATIENT HANDOFF REPORT  ED Nurse Name and Phone #:  Corliss Blacker, RN [phone]  S Name/Age/Gender Alexandria Leach 72 y.o. female Room/Bed: 002C/002C  Code Status   Code Status: Do not attempt resuscitation (DNR) PRE-ARREST INTERVENTIONS DESIRED  Home/SNF/Other Home Patient oriented to: self, place, time, and situation Is this baseline? Yes   Triage Complete: Triage complete  Chief Complaint Pneumonia [J18.9] Community acquired pneumonia [J18.9] CHF (congestive heart failure) (HCC) [I50.9]  Triage Note Pt to the ed from home via ems  with a CC of sob x 3 days getting worst today. Pt was 85% for ems and was placed on 2 LPM and started feeling better. Pt relays coughing with congestion. Fever, chills, n/v.     Allergies Allergies  Allergen Reactions   Cymbalta [Duloxetine Hcl] Other (See Comments)    tachycardia   Statins Nausea Only and Other (See Comments)    Muscle cramps also   Sulfa Antibiotics Nausea And Vomiting    Level of Care/Admitting Diagnosis ED Disposition     ED Disposition  Admit   Condition  --   Comment  Hospital Area: [facility] [[id number]]  Level of Care: Telemetry Cardiac [[id number]]  May admit patient to [facility] or [facility] if equivalent level of care is available:: No  Covid Evaluation: Confirmed COVID Negative  Diagnosis: CHF (congestive heart failure) [facility]) [[id number]]  Admitting Physician: Zannie Cove [[id number]]  Attending Physician: Zannie Cove [[id number]]  Certification:: I certify this patient will need inpatient services for at least 2 midnights          B Medical/Surgery History Past Medical History:  Diagnosis Date   Ankle fracture, left 02/19/2015   from a fall   Anxiety    Cataract 2014   corrected with surgery   CHF (congestive heart failure) (HCC)    CKD (chronic kidney disease) stage 3, GFR 30-59 ml/min [facility])    saw nephrologist Dr Stephens Shire   COVID-19 09/21/2021   DDD (degenerative disc  disease), cervical    Depression    Fibromyalgia    Glaucoma    s/p surgery, sees ophtho Q6 mo   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 Thought benign.    Osteoarthritis    shoulders and knees, not RA per Dr Corliss Skains, positive ANA, positive Ro   Osteopenia 09/18/2015   DEXA T -1.1 hip, -0.2 spine 08/2015    Personal history of urinary calculi latest 2014   Pneumonia 12/02/2011   PONV (postoperative nausea and vomiting)    Refusal of blood transfusions as patient is Jehovah's Witness    Rheumatic heart disease 1980   s/p mitral valve repair 1980   Sjogren's syndrome (HCC)    Trimalleolar fracture of left ankle 02/23/2015   Past Surgical History:  Procedure Laterality Date   BREAST BIOPSY Right 2006   benign   CARDIOVERSION N/A 07/18/2020   Procedure: CARDIOVERSION;  Surgeon: Parke Poisson, MD;  Location: [facility] ENDOSCOPY;  Service: Cardiovascular;  Laterality: N/A;   CHOLECYSTECTOMY  11/27/2011   Procedure: LAPAROSCOPIC CHOLECYSTECTOMY WITH INTRAOPERATIVE CHOLANGIOGRAM;  Surgeon:  Ernestene Mention, MD;  Location: [facility] OR;  Service: General;  Laterality: N/A;  laparoscopic cholecystectomy with choleangiogram umbilical hernia repair   COLONOSCOPY  07/2014   WNL Madilyn Fireman)   COLONOSCOPY  08/2019   3 TAs, rpt 3 yrs ([place])   dexa  08/2012   normal per patient - no records available   ESOPHAGOGASTRODUODENOSCOPY  08/2019   reactive gastropathy, neg H pylori ([place])   EYE SURGERY Bilateral 2014   cataract removal   MITRAL VALVE REPAIR  1980   open heart   ORIF ANKLE FRACTURE Left 02/26/2015   Procedure: OPEN REDUCTION INTERNAL  FIXATION (ORIF) LEFT TRIMALLEOLAR ANKLE FRACTURE;  Surgeon: Tarry Kos, MD;  Location: [facility] OR;  Service: Orthopedics;  Laterality: Left;   TUBAL LIGATION  1980   UMBILICAL HERNIA REPAIR  11/27/2011   Procedure: HERNIA REPAIR UMBILICAL ADULT;  Surgeon: Ernestene Mention, MD;  Location: [facility] OR;  Service: General;  Laterality: N/A;   VAGINAL HYSTERECTOMY  1992   for fibroids -- partial, ovaries remain     A IV Location/Drains/Wounds Patient Lines/Drains/Airways Status     Active Line/Drains/Airways     Name Placement date Placement time Site Days   Peripheral IV 11/29/22 20 G 1" Anterior;Left;Proximal Forearm 11/29/22  1659  Forearm  1            Intake/Output Last 24 hours  Intake/Output Summary (Last 24 hours) at 11/30/2022 1301 Last data filed at 11/30/2022 1036 Gross per 24 hour  Intake 2584.32 ml  Output 1550 ml  Net 1034.32 ml    Labs/Imaging Results for orders placed or performed during the hospital encounter of 11/29/22 (from the past 48 hour(s))  Resp panel by RT-PCR (RSV, Flu A&B, Covid) Anterior Nasal Swab     Status: None   Collection Time: 11/29/22  3:49 PM   Specimen: Anterior Nasal Swab  Result Value Ref Range   SARS Coronavirus 2 by RT PCR NEGATIVE NEGATIVE   Influenza A by PCR NEGATIVE NEGATIVE   Influenza B by PCR NEGATIVE NEGATIVE    Comment: (NOTE) The Xpert Xpress SARS-CoV-2/FLU/RSV plus assay is intended as an aid in the diagnosis of influenza from Nasopharyngeal swab specimens and should not be used as a sole basis for treatment. Nasal washings and aspirates are unacceptable for Xpert Xpress SARS-CoV-2/FLU/RSV testing.  Fact Sheet for Patients: BloggerCourse.com  Fact Sheet for Healthcare Providers: SeriousBroker.it  This test is not yet approved or cleared by the [place] FDA and has been authorized for detection and/or diagnosis of SARS-CoV-2 by FDA under an Emergency Use Authorization  (EUA). This EUA will remain in effect (meaning this test can be used) for the duration of the COVID-19 declaration under Section 564(b)(1) of the Act, 21 U.S.C. section 360bbb-3(b)(1), unless the authorization is terminated or revoked.     Resp Syncytial Virus by PCR NEGATIVE NEGATIVE    Comment: (NOTE) Fact Sheet for Patients: BloggerCourse.com  Fact Sheet for Healthcare Providers: SeriousBroker.it  This test is not yet approved or cleared by the [place] FDA and has been authorized for detection and/or diagnosis of SARS-CoV-2 by FDA under an Emergency Use Authorization (EUA). This EUA will remain in effect (meaning this test can be used) for the duration of the COVID-19 declaration under Section 564(b)(1) of the Act, 21 U.S.C. section 360bbb-3(b)(1), unless the authorization is terminated or revoked.  Performed at [facility], [address], [place], [place] [address]   CBC with Differential  Status: Abnormal   Collection Time: 11/29/22  3:49 PM  Result Value Ref Range   WBC 12.4 (H) 4.0 - 10.5 K/uL   RBC 5.53 (H) 3.87 - 5.11 MIL/uL   Hemoglobin 14.8 12.0 - 15.0 g/dL   HCT 44.0 (H) 10.2 - 72.5 %   MCV 85.4 80.0 - 100.0 fL   MCH 26.8 26.0 - 34.0 pg   MCHC 31.4 30.0 - 36.0 g/dL   RDW 36.6 44.0 - 34.7 %   Platelets 238 150 - 400 K/uL   nRBC 0.0 0.0 - 0.2 %   Neutrophils Relative % 88 %   Neutro Abs 10.8 (H) 1.7 - 7.7 K/uL   Lymphocytes Relative 7 %   Lymphs Abs 0.9 0.7 - 4.0 K/uL   Monocytes Relative 5 %   Monocytes Absolute 0.6 0.1 - 1.0 K/uL   Eosinophils Relative 0 %   Eosinophils Absolute 0.0 0.0 - 0.5 K/uL   Basophils Relative 0 %   Basophils Absolute 0.1 0.0 - 0.1 K/uL   Immature Granulocytes 0 %   Abs Immature Granulocytes 0.05 0.00 - 0.07 K/uL    Comment: Performed at [facility], [address], [place], [place] [address]  Protime-INR     Status: Abnormal   Collection Time: 11/29/22   3:49 PM  Result Value Ref Range   Prothrombin Time 15.4 (H) 11.4 - 15.2 seconds   INR 1.2 0.8 - 1.2    Comment: (NOTE) INR goal varies based on device and disease states. Performed at [facility], [address], [place], [place] [address]   APTT     Status: None   Collection Time: 11/29/22  3:49 PM  Result Value Ref Range   aPTT 30 24 - 36 seconds    Comment: Performed at [facility], [address] [place], [place] [address]  Urinalysis, w/ Reflex to Culture (Infection Suspected) -Urine, Clean Catch     Status: Abnormal   Collection Time: 11/29/22  3:49 PM  Result Value Ref Range   Specimen Source URINE, CLEAN CATCH    Color, Urine YELLOW YELLOW   APPearance CLEAR CLEAR   Specific Gravity, Urine 1.020 1.005 - 1.030   pH 7.0 5.0 - 8.0   Glucose, UA NEGATIVE NEGATIVE mg/dL   Hgb urine dipstick TRACE (A) NEGATIVE   Bilirubin Urine NEGATIVE NEGATIVE   Ketones, ur NEGATIVE NEGATIVE mg/dL   Protein, ur NEGATIVE NEGATIVE mg/dL   Nitrite NEGATIVE NEGATIVE   Leukocytes,Ua TRACE (A) NEGATIVE   Squamous Epithelial / HPF 6-10 0 - 5 /HPF   WBC, UA 0-5 0 - 5 WBC/hpf    Comment: Reflex urine culture not performed if WBC <=10, OR if Squamous epithelial cells >5. If Squamous epithelial cells >5, suggest recollection.   RBC / HPF 0-5 0 - 5 RBC/hpf   Bacteria, UA RARE (A) NONE SEEN   Mucus PRESENT     Comment: Performed at [facility], [address] [place], [place] [address]  Troponin I (High Sensitivity)     Status: Abnormal   Collection Time: 11/29/22  3:49 PM  Result Value Ref Range   Troponin I (High Sensitivity) 33 (H) <18 ng/L    Comment: (NOTE) Elevated high sensitivity troponin I (hsTnI) values and significant  changes across serial measurements may suggest ACS but many other  chronic and acute conditions are known to elevate hsTnI results.  Refer to the "Links" section for chest pain algorithms and additional  guidance. Performed at [facility]  Lab, [address] [place], [place] [address]   Blood Culture (routine x 2)     Status: None (Preliminary result)   Collection Time: 11/29/22  3:51 PM   Specimen: BLOOD RIGHT ARM  Result Value Ref Range   Specimen Description BLOOD RIGHT ARM    Special Requests      BOTTLES DRAWN AEROBIC AND ANAEROBIC Blood Culture adequate volume   Culture      NO GROWTH < 24 HOURS Performed at [facility], [address], [place], [place] [address]    Report Status PENDING   Brain natriuretic peptide     Status: Abnormal   Collection Time: 11/29/22  3:57 PM  Result Value Ref Range   B Natriuretic Peptide 242.7 (H) 0.0 - 100.0 pg/mL    Comment: Performed at [facility], [address], [place], [place] [address]  I-Stat Lactic Acid, ED     Status: Abnormal   Collection Time: 11/29/22  4:00 PM  Result Value Ref Range   Lactic Acid, Venous 2.9 (HH) 0.5 - 1.9 mmol/L   Comment NOTIFIED PHYSICIAN   Comprehensive metabolic panel     Status: Abnormal   Collection Time: 11/29/22  4:55 PM  Result Value Ref Range   Sodium 135 135 - 145 mmol/L   Potassium 4.6 3.5 - 5.1 mmol/L    Comment: HEMOLYSIS AT THIS LEVEL MAY AFFECT RESULT   Chloride 105 98 - 111 mmol/L   CO2 18 (L) 22 - 32 mmol/L   Glucose, Bld 116 (H) 70 - 99 mg/dL    Comment: Glucose reference range applies only to samples taken after fasting for at least 8 hours.   BUN 9 8 - 23 mg/dL   Creatinine, Ser 2.13 (H) 0.44 - 1.00 mg/dL   Calcium 9.5 8.9 - 08.6 mg/dL   Total Protein 7.6 6.5 - 8.1 g/dL   Albumin 3.8 3.5 - 5.0 g/dL   AST 38 15 - 41 U/L    Comment: HEMOLYSIS AT THIS LEVEL MAY AFFECT RESULT   ALT 19 0 - 44 U/L    Comment: HEMOLYSIS AT THIS LEVEL MAY AFFECT RESULT   Alkaline Phosphatase 64 38 - 126 U/L   Total Bilirubin 1.3 (H) 0.3 - 1.2 mg/dL    Comment: HEMOLYSIS AT THIS LEVEL MAY AFFECT RESULT   GFR, Estimated 44 (L) >60 mL/min    Comment: (NOTE) Calculated using the CKD-EPI Creatinine Equation (2021)    Anion gap  12 5 - 15    Comment: Performed at [facility] Lab, [address], [place], [place] [address]  Troponin I (High Sensitivity)     Status: Abnormal   Collection Time: 11/29/22  5:57 PM  Result Value Ref Range   Troponin I (High Sensitivity) 28 (H) <18 ng/L    Comment: (NOTE) Elevated high sensitivity troponin I (hsTnI) values and significant  changes across serial measurements may suggest ACS but many other  chronic and acute conditions are known to elevate hsTnI results.  Refer to the "Links" section for chest pain algorithms and additional  guidance. Performed at [facility], [address] [place], [place] [address]   I-Stat Lactic Acid, ED     Status: Abnormal   Collection Time: 11/29/22  6:53 PM  Result Value Ref Range   Lactic Acid, Venous 7.6 (HH) 0.5 - 1.9 mmol/L   Comment NOTIFIED PHYSICIAN   HIV Antibody (routine testing w rflx)  Status: None   Collection Time: 11/29/22  8:06 PM  Result Value Ref Range   HIV Screen 4th Generation wRfx Non Reactive Non Reactive    Comment: Performed at [facility], [address] [place], [place] [address]  Troponin I (High Sensitivity)     Status: Abnormal   Collection Time: 11/29/22  8:06 PM  Result Value Ref Range   Troponin I (High Sensitivity) 49 (H) <18 ng/L    Comment: RESULT CALLED TO, READ BACK BY AND VERIFIED WITH Emeline Gins RN [phone] M.ALAMANO (NOTE) Elevated high sensitivity troponin I (hsTnI) values and significant  changes across serial measurements may suggest ACS but many other  chronic and acute conditions are known to elevate hsTnI results.  Refer to the "Links" section for chest pain algorithms and additional  guidance. Performed at [facility], [address], [place], [place] [address]   I-Stat CG4 Lactic Acid     Status: Abnormal   Collection Time: 11/29/22  8:35 PM  Result Value Ref Range   Lactic Acid, Venous 2.2 (HH) 0.5 - 1.9 mmol/L   Comment NOTIFIED PHYSICIAN   Blood  gas, venous     Status: Abnormal   Collection Time: 11/29/22 10:38 PM  Result Value Ref Range   pH, Ven 7.47 (H) 7.25 - 7.43   pCO2, Ven 32 (L) 44 - 60 mmHg   pO2, Ven 169 (H) 32 - 45 mmHg   Bicarbonate 23.3 20.0 - 28.0 mmol/L   Acid-Base Excess 0.3 0.0 - 2.0 mmol/L   O2 Saturation 100 %   Patient temperature 37.0     Comment: Performed at [facility], [address], [place], [place] [address]  Troponin I (High Sensitivity)     Status: Abnormal   Collection Time: 11/30/22 12:21 AM  Result Value Ref Range   Troponin I (High Sensitivity) 63 (H) <18 ng/L    Comment: (NOTE) Elevated high sensitivity troponin I (hsTnI) values and significant  changes across serial measurements may suggest ACS but many other  chronic and acute conditions are known to elevate hsTnI results.  Refer to the "Links" section for chest pain algorithms and additional  guidance. Performed at [facility], [address], [place], [place] [address]   APTT     Status: Abnormal   Collection Time: 11/30/22 12:57 AM  Result Value Ref Range   aPTT 42 (H) 24 - 36 seconds    Comment:        IF BASELINE aPTT IS ELEVATED, SUGGEST PATIENT RISK ASSESSMENT BE USED TO DETERMINE APPROPRIATE ANTICOAGULANT THERAPY. Performed at [facility], [address], [place], [place] [address]   Protime-INR     Status: Abnormal   Collection Time: 11/30/22 12:57 AM  Result Value Ref Range   Prothrombin Time 17.8 (H) 11.4 - 15.2 seconds   INR 1.5 (H) 0.8 - 1.2    Comment: (NOTE) INR goal varies based on device and disease states. Performed at [facility], [address] [place], [place] [address]   Basic metabolic panel     Status: Abnormal   Collection Time: 11/30/22 12:57 AM  Result Value Ref Range   Sodium 138 135 - 145 mmol/L   Potassium 4.0 3.5 - 5.1 mmol/L   Chloride 105 98 - 111 mmol/L   CO2 20 (L) 22 - 32 mmol/L   Glucose, Bld 128 (H) 70 - 99 mg/dL    Comment: Glucose reference range  applies only to samples taken after fasting for at least 8 hours.   BUN 9 8 - 23 mg/dL   Creatinine, Ser 1.61 (H) 0.44 - 1.00 mg/dL   Calcium 9.2 8.9 - 09.6 mg/dL   GFR, Estimated 50 (L) >60 mL/min    Comment: (NOTE) Calculated using the CKD-EPI Creatinine Equation (2021)    Anion gap 13 5 - 15    Comment: Performed at [facility], [address], [place], [place] [address]  CBC     Status: Abnormal   Collection Time: 11/30/22 12:57 AM  Result Value Ref Range   WBC 14.5 (H) 4.0 - 10.5 K/uL   RBC 4.10 3.87 - 5.11 MIL/uL   Hemoglobin 11.0 (L) 12.0 - 15.0 g/dL    Comment: REPEATED TO VERIFY   HCT 34.9 (L) 36.0 - 46.0 %   MCV 85.1 80.0 - 100.0 fL   MCH 26.8 26.0 - 34.0 pg   MCHC 31.5 30.0 - 36.0 g/dL   RDW 98.1 19.1 - 47.8 %   Platelets 211 150 - 400 K/uL   nRBC 0.0 0.0 - 0.2 %    Comment: Performed at [facility] Lab, [address], [place], [place] [address]  Troponin I (High Sensitivity)     Status: Abnormal   Collection Time: 11/30/22  2:48 AM  Result Value Ref Range   Troponin I (High Sensitivity) 66 (H) <18 ng/L    Comment: (NOTE) Elevated high sensitivity troponin I (hsTnI) values and significant  changes across serial measurements may suggest ACS but many other  chronic and acute conditions are known to elevate hsTnI results.  Refer to the "Links" section for chest pain algorithms and additional  guidance. Performed at [facility] Lab, [address] [place], [place] [address]    DG Chest Port 1 View  Result Date: 11/29/2022 CLINICAL DATA:  Shortness of breath EXAM: PORTABLE CHEST 1 VIEW COMPARISON:  11/02/2017 FINDINGS: Patchy bilateral mid/lower lung opacities, suspicious for pneumonia. No pleural effusion or pneumothorax. The heart is top-normal in size. Median sternotomy. IMPRESSION: Multifocal pneumonia, lower lobe predominant. Electronically Signed   By: Charline Bills M.D.   On: 11/29/2022 17:20    Pending Labs Unresulted Labs (From admission,  onward)     Start     Ordered   12/01/22 0500  CBC  Tomorrow morning,   R        11/30/22 0721   12/01/22 0500  Basic metabolic panel  Tomorrow morning,   R        11/30/22 0721   12/01/22 0500  Magnesium  Tomorrow morning,   R        11/30/22 0947   11/30/22 1201  Magnesium  ONCE - STAT,   STAT        11/30/22 1200   11/29/22 1549  Blood Culture (routine x 2)  (Septic presentation on arrival (screening labs, nursing and treatment orders for obvious sepsis))  BLOOD CULTURE X 2,   STAT      11/29/22 1549            Vitals/Pain Today's Vitals   11/30/22 1000 11/30/22 1045 11/30/22 1130 11/30/22 1245  BP: (!) 142/74 (!) 130/47 126/72   Pulse: 88 73 73   Resp: 12 15 20    Temp:    98.7 F (37.1 C)  TempSrc:    Oral  SpO2: 100% 100% 100%   Weight:      Height:  PainSc:        Isolation Precautions No active isolations  Medications Medications  acetaminophen (TYLENOL) tablet 650 mg (650 mg Oral Given 11/29/22 2003)    Or  acetaminophen (TYLENOL) suppository 650 mg ( Rectal See Alternative 11/29/22 2003)  polyethylene glycol (MIRALAX / GLYCOLAX) packet 17 g (has no administration in time range)  sodium chloride flush (NS) 0.9 % injection 3 mL (3 mLs Intravenous Not Given 11/29/22 2209)  traMADol (ULTRAM) tablet 50 mg (has no administration in time range)  ALPRAZolam (XANAX) tablet 0.25 mg (has no administration in time range)  dofetilide (TIKOSYN) capsule 250 mcg (250 mcg Oral Given 11/30/22 0824)  apixaban (ELIQUIS) tablet 5 mg (5 mg Oral Given 11/30/22 0811)  gabapentin (NEURONTIN) capsule 600 mg (600 mg Oral Given 11/30/22 0811)  gabapentin (NEURONTIN) capsule 300 mg (has no administration in time range)  ezetimibe (ZETIA) tablet 10 mg (10 mg Oral Given 11/29/22 2244)  metoprolol tartrate (LOPRESSOR) tablet 75 mg (75 mg Oral Given 11/30/22 0811)  diltiazem (CARDIZEM CD) 24 hr capsule 360 mg (360 mg Oral Given 11/30/22 0913)  cyanocobalamin (VITAMIN B12) tablet 1,000 mcg  (1,000 mcg Oral Given 11/29/22 2004)  temazepam (RESTORIL) capsule 30 mg (has no administration in time range)  docusate sodium (COLACE) capsule 100 mg (100 mg Oral Given 11/30/22 0811)  potassium chloride (KLOR-CON M) CR tablet 10 mEq (10 mEq Oral Given 11/30/22 0811)  cycloSPORINE (RESTASIS) 0.05 % ophthalmic emulsion 1 drop (1 drop Both Eyes Given 11/30/22 0824)  pantoprazole (PROTONIX) EC tablet 40 mg (40 mg Oral Given 11/30/22 0811)  cholecalciferol (VITAMIN D3) 25 MCG (1000 UNIT) tablet 1,000 Units (1,000 Units Oral Given 11/30/22 0811)  thiamine (VITAMIN B1) tablet 100 mg (100 mg Oral Given 11/29/22 2209)  cefTRIAXone (ROCEPHIN) 1 g in sodium chloride 0.9 % 100 mL IVPB (0 g Intravenous Stopped 11/30/22 0913)  doxycycline (VIBRAMYCIN) 100 mg in sodium chloride 0.9 % 250 mL IVPB (has no administration in time range)  lactated ringers bolus 500 mL (0 mLs Intravenous Stopped 11/29/22 1835)  cefTRIAXone (ROCEPHIN) 1 g in sodium chloride 0.9 % 100 mL IVPB (0 g Intravenous Stopped 11/29/22 1930)  azithromycin (ZITHROMAX) 500 mg in sodium chloride 0.9 % 250 mL IVPB (0 mg Intravenous Stopped 11/29/22 2112)  furosemide (LASIX) injection 40 mg (40 mg Intravenous Given 11/30/22 0811)    Mobility walks     Focused Assessments Pulmonary Assessment Handoff:  Lung sounds: Bilateral Breath Sounds: Diminished L Breath Sounds: Diminished R Breath Sounds: Diminished O2 Device: Nasal Cannula O2 Flow Rate (L/min): 2 L/min    R Recommendations: See Admitting Provider Note  Report given to:   Additional Notes:

## 2022-11-30 NOTE — ED Notes (Signed)
Patient assisted to [facility] to have BM. Patient became short of breath with limited movement.

## 2022-11-30 NOTE — ED Notes (Signed)
Patient turned, cleaned, linens changed, new gown applied.

## 2022-11-30 NOTE — ED Notes (Signed)
Pt is able to walk to bedside commode and back to bed independently. Reports feeling better. Continues to report mild sob on exertion. O2 at 2LPM [place] maintained for comfort. O2 at 100% on 2LPM .

## 2022-12-01 DIAGNOSIS — J189 Pneumonia, unspecified organism: Secondary | ICD-10-CM | POA: Diagnosis not present

## 2022-12-01 LAB — CBC
HCT: 36.4 % (ref 36.0–46.0)
Hemoglobin: 11.5 g/dL — ABNORMAL LOW (ref 12.0–15.0)
MCH: 27 pg (ref 26.0–34.0)
MCHC: 31.6 g/dL (ref 30.0–36.0)
MCV: 85.4 fL (ref 80.0–100.0)
Platelets: 158 10*3/uL (ref 150–400)
RBC: 4.26 MIL/uL (ref 3.87–5.11)
RDW: 14.5 % (ref 11.5–15.5)
WBC: 14.3 10*3/uL — ABNORMAL HIGH (ref 4.0–10.5)
nRBC: 0 % (ref 0.0–0.2)

## 2022-12-01 LAB — BASIC METABOLIC PANEL
Anion gap: 12 (ref 5–15)
BUN: 10 mg/dL (ref 8–23)
CO2: 19 mmol/L — ABNORMAL LOW (ref 22–32)
Calcium: 9.4 mg/dL (ref 8.9–10.3)
Chloride: 106 mmol/L (ref 98–111)
Creatinine, Ser: 1.3 mg/dL — ABNORMAL HIGH (ref 0.44–1.00)
GFR, Estimated: 44 mL/min — ABNORMAL LOW (ref >60)
Glucose, Bld: 102 mg/dL — ABNORMAL HIGH (ref 70–99)
Potassium: 4.9 mmol/L (ref 3.5–5.1)
Sodium: 137 mmol/L (ref 135–145)

## 2022-12-01 LAB — MAGNESIUM: Magnesium: 2 mg/dL (ref 1.7–2.4)

## 2022-12-01 MED ORDER — FUROSEMIDE 40 MG PO TABS
60.0000 mg | ORAL_TABLET | Freq: Every day | ORAL | Status: DC
  Administered 2022-12-01 – 2022-12-03 (×3): 60 mg via ORAL
  Filled 2022-12-01 (×3): qty 1

## 2022-12-01 NOTE — Progress Notes (Signed)
Mobility Specialist Progress Note:   12/01/22 1120  Mobility  Activity Ambulated independently in hallway  Level of Assistance Modified independent, requires aide device or extra time  Assistive Device Front wheel walker  Distance Ambulated (ft) 200 ft  Activity Response Tolerated well  Mobility Referral Yes  $Mobility charge 1 Mobility  Mobility Specialist Start Time (ACUTE ONLY) 1120  Mobility Specialist Stop Time (ACUTE ONLY) 1135  Mobility Specialist Time Calculation (min) (ACUTE ONLY) 15 min   Pt ambulated in hallway at a modI level with RW. Denies SOB or pain. Pt left sitting EOB with all needs met.   Addison Lank Mobility Specialist Please contact via SecureChat or  Rehab office at [phone]

## 2022-12-01 NOTE — Progress Notes (Addendum)
PROGRESS NOTE    Alexandria Leach  OZH:[id number] DOB: 06-25-1950 DOA: 11/29/2022 PCP: Eustaquio Boyden, MD  [name] with history of chronic diastolic CHF, remote PE, chronic A-fib, Sjogren's disease, depression presented to the ED with cough congestion shortness of breath and pleuritic right-sided chest pain for 5 to 6 days, followed by nausea and vomiting yesterday. -In the ED she was noted to be mildly hypoxic, temp 99.3, labs noted WBC of 12.4 with mild lactic acidosis, BNP 242, creatinine 1.3, x-ray noted multifocal pneumonia   Subjective: -Feels better overall, breathing is improving  Assessment and Plan:  Multifocal pneumonia Sepsis, POA -Discontinue IV fluids, clinically appears mildly overloaded at this time -Continue IV ceftriaxone and azithromycin -Flu and COVID PCR are negative, blood cultures have thus far -No risk factors for aspiration -Changed to oral antibiotics tomorrow  Acute on chronic diastolic CHF -Last echo 10/23 with EF 60-65%, normal RV and moderate aortic regurgitation -Volume status improving, Lasix x 1 yesterday, changed to oral diuretics today  Atrial fibrillation, chronic (HCC) See cardio note 10/10/2022. Diagnosed with "persitent a-flutter".  -Continue Tikosyn, diltiazem, metoprolol and Eliquis -Electrolytes, QTc stable  Sjogren's syndrome (HCC) C.w. restasis eye drop -Follow-up with rheumatology  MDD (major depressive disorder), recurrent episode (HCC) -On Xanax, continued  DVT prophylaxis: Apixaban Code Status: DNR Family Communication: None present Disposition Plan: Home likely tomorrow  Consultants:    Procedures:   Antimicrobials:    Objective: Vitals:   12/01/22 0031 12/01/22 0434 12/01/22 0815 12/01/22 0850  BP: (!) 149/77 (!) 147/63 (!) 151/76 (!) 152/68  Pulse: 95  85   Resp: 20 20  (!) 24  Temp: 100.3 F (37.9 C) 99.2 F (37.3 C)  99.7 F (37.6 C)  TempSrc: Oral Oral  Oral  SpO2: 96% 97%    Weight:      Height:         Intake/Output Summary (Last 24 hours) at 12/01/2022 1033 Last data filed at 12/01/2022 0700 Gross per 24 hour  Intake 960 ml  Output 2050 ml  Net -1090 ml   Filed Weights   11/29/22 1502  Weight: 88 kg    Examination:  General exam: Pleasant female sitting up in bed, AAOx3, no distress HEENT: No JVD CVS: S1-S2, regular rhythm Lungs: Clear bilaterally Abdomen: Soft, nontender, bowel sounds present Remedies: No edema Skin: No rashes Psychiatry:  Mood & affect appropriate.     Data Reviewed:   CBC: Recent Labs  Lab 11/29/22 1549 11/30/22 0057 12/01/22 0350  WBC 12.4* 14.5* 14.3*  NEUTROABS 10.8*  --   --   HGB 14.8 11.0* 11.5*  HCT 47.2* 34.9* 36.4  MCV 85.4 85.1 85.4  PLT 238 211 158   Basic Metabolic Panel: Recent Labs  Lab 11/29/22 1655 11/30/22 0057 11/30/22 1524 12/01/22 0350  NA 135 138  --  137  K 4.6 4.0  --  4.9  CL 105 105  --  106  CO2 18* 20*  --  19*  GLUCOSE 116* 128*  --  102*  BUN 9 9  --  10  CREATININE 1.30* 1.18*  --  1.30*  CALCIUM 9.5 9.2  --  9.4  MG  --   --  1.8 2.0   GFR: Estimated Creatinine Clearance: 40.6 mL/min (A) (by C-G formula based on SCr of 1.3 mg/dL (H)). Liver Function Tests: Recent Labs  Lab 11/29/22 1655  AST 38  ALT 19  ALKPHOS 64  BILITOT 1.3*  PROT 7.6  ALBUMIN 3.8  No results for input(s): "LIPASE", "AMYLASE" in the last 168 hours. No results for input(s): "AMMONIA" in the last 168 hours. Coagulation Profile: Recent Labs  Lab 11/29/22 1549 11/30/22 0057  INR 1.2 1.5*   Cardiac Enzymes: No results for input(s): "CKTOTAL", "CKMB", "CKMBINDEX", "TROPONINI" in the last 168 hours. BNP (last 3 results) No results for input(s): "PROBNP" in the last 8760 hours. HbA1C: No results for input(s): "HGBA1C" in the last 72 hours. CBG: No results for input(s): "GLUCAP" in the last 168 hours. Lipid Profile: No results for input(s): "CHOL", "HDL", "LDLCALC", "TRIG", "CHOLHDL", "LDLDIRECT" in the last  72 hours. Thyroid Function Tests: No results for input(s): "TSH", "T4TOTAL", "FREET4", "T3FREE", "THYROIDAB" in the last 72 hours. Anemia Panel: No results for input(s): "VITAMINB12", "FOLATE", "FERRITIN", "TIBC", "IRON", "RETICCTPCT" in the last 72 hours. Urine analysis:    Component Value Date/Time   COLORURINE YELLOW 11/29/2022 1549   APPEARANCEUR CLEAR 11/29/2022 1549   LABSPEC 1.020 11/29/2022 1549   PHURINE 7.0 11/29/2022 1549   GLUCOSEU NEGATIVE 11/29/2022 1549   HGBUR TRACE (A) 11/29/2022 1549   BILIRUBINUR NEGATIVE 11/29/2022 1549   BILIRUBINUR negative 07/16/2018 1017   KETONESUR NEGATIVE 11/29/2022 1549   PROTEINUR NEGATIVE 11/29/2022 1549   UROBILINOGEN 0.2 08/07/2018 1249   NITRITE NEGATIVE 11/29/2022 1549   LEUKOCYTESUR TRACE (A) 11/29/2022 1549   Sepsis Labs: @LABRCNTIP (procalcitonin:4,lacticidven:4)  ) Recent Results (from the past 240 hour(s))  Resp panel by RT-PCR (RSV, Flu A&B, Covid) Anterior Nasal Swab     Status: None   Collection Time: 11/29/22  3:49 PM   Specimen: Anterior Nasal Swab  Result Value Ref Range Status   SARS Coronavirus 2 by RT PCR NEGATIVE NEGATIVE Final   Influenza A by PCR NEGATIVE NEGATIVE Final   Influenza B by PCR NEGATIVE NEGATIVE Final    Comment: (NOTE) The Xpert Xpress SARS-CoV-2/FLU/RSV plus assay is intended as an aid in the diagnosis of influenza from Nasopharyngeal swab specimens and should not be used as a sole basis for treatment. Nasal washings and aspirates are unacceptable for Xpert Xpress SARS-CoV-2/FLU/RSV testing.  Fact Sheet for Patients: BloggerCourse.com  Fact Sheet for Healthcare Providers: SeriousBroker.it  This test is not yet approved or cleared by the [place] FDA and has been authorized for detection and/or diagnosis of SARS-CoV-2 by FDA under an Emergency Use Authorization (EUA). This EUA will remain in effect (meaning this test can be used) for  the duration of the COVID-19 declaration under Section 564(b)(1) of the Act, 21 U.S.C. section 360bbb-3(b)(1), unless the authorization is terminated or revoked.     Resp Syncytial Virus by PCR NEGATIVE NEGATIVE Final    Comment: (NOTE) Fact Sheet for Patients: BloggerCourse.com  Fact Sheet for Healthcare Providers: SeriousBroker.it  This test is not yet approved or cleared by the [place] FDA and has been authorized for detection and/or diagnosis of SARS-CoV-2 by FDA under an Emergency Use Authorization (EUA). This EUA will remain in effect (meaning this test can be used) for the duration of the COVID-19 declaration under Section 564(b)(1) of the Act, 21 U.S.C. section 360bbb-3(b)(1), unless the authorization is terminated or revoked.  Performed at [facility], [address], [place], [place] [address]   Blood Culture (routine x 2)     Status: None (Preliminary result)   Collection Time: 11/29/22  3:51 PM   Specimen: BLOOD RIGHT ARM  Result Value Ref Range Status   Specimen Description BLOOD RIGHT ARM  Final   Special Requests   Final  BOTTLES DRAWN AEROBIC AND ANAEROBIC Blood Culture adequate volume   Culture   Final    NO GROWTH 2 DAYS Performed at [facility], [address], [place], [place] [address]    Report Status PENDING  Incomplete  Blood Culture (routine x 2)     Status: None (Preliminary result)   Collection Time: 11/30/22 12:57 AM   Specimen: BLOOD RIGHT HAND  Result Value Ref Range Status   Specimen Description BLOOD RIGHT HAND  Final   Special Requests   Final    BOTTLES DRAWN AEROBIC AND ANAEROBIC Blood Culture results may not be optimal due to an excessive volume of blood received in culture bottles   Culture   Final    NO GROWTH 1 DAY Performed at [facility], [address], [place], [place] [address]    Report Status PENDING  Incomplete     Radiology Studies: DG Chest  Port 1 View  Result Date: 11/29/2022 CLINICAL DATA:  Shortness of breath EXAM: PORTABLE CHEST 1 VIEW COMPARISON:  11/02/2017 FINDINGS: Patchy bilateral mid/lower lung opacities, suspicious for pneumonia. No pleural effusion or pneumothorax. The heart is top-normal in size. Median sternotomy. IMPRESSION: Multifocal pneumonia, lower lobe predominant. Electronically Signed   By: Charline Bills M.D.   On: 11/29/2022 17:20     Scheduled Meds:  apixaban  5 mg Oral BID   cholecalciferol  1,000 Units Oral Daily   cyanocobalamin  1,000 mcg Oral Weekly   cycloSPORINE  1 drop Both Eyes BID   diltiazem  360 mg Oral Daily   docusate sodium  100 mg Oral Daily   dofetilide  250 mcg Oral BID   ezetimibe  10 mg Oral QHS   furosemide  60 mg Oral Daily   gabapentin  300 mg Oral Q24H   gabapentin  600 mg Oral BID   metoprolol tartrate  75 mg Oral BID   pantoprazole  40 mg Oral Daily   potassium chloride  10 mEq Oral Daily   sodium chloride flush  3 mL Intravenous Q12H   thiamine  100 mg Oral Weekly   Continuous Infusions:  cefTRIAXone (ROCEPHIN)  IV Stopped (11/30/22 0913)   doxycycline (VIBRAMYCIN) IV 100 mg (12/01/22 0828)     LOS: 2 days    Time spent:    Zannie Cove, MD [facility]   12/01/2022, 10:33 AM

## 2022-12-02 ENCOUNTER — Other Ambulatory Visit: Payer: Self-pay | Admitting: Cardiology

## 2022-12-02 DIAGNOSIS — J189 Pneumonia, unspecified organism: Secondary | ICD-10-CM | POA: Diagnosis not present

## 2022-12-02 LAB — MAGNESIUM: Magnesium: 1.9 mg/dL (ref 1.7–2.4)

## 2022-12-02 LAB — BASIC METABOLIC PANEL
Anion gap: 9 (ref 5–15)
BUN: 11 mg/dL (ref 8–23)
CO2: 20 mmol/L — ABNORMAL LOW (ref 22–32)
Calcium: 8.9 mg/dL (ref 8.9–10.3)
Chloride: 108 mmol/L (ref 98–111)
Creatinine, Ser: 1.26 mg/dL — ABNORMAL HIGH (ref 0.44–1.00)
GFR, Estimated: 46 mL/min — ABNORMAL LOW (ref >60)
Glucose, Bld: 120 mg/dL — ABNORMAL HIGH (ref 70–99)
Potassium: 3.7 mmol/L (ref 3.5–5.1)
Sodium: 137 mmol/L (ref 135–145)

## 2022-12-02 LAB — CBC
HCT: 33.9 % — ABNORMAL LOW (ref 36.0–46.0)
Hemoglobin: 11.1 g/dL — ABNORMAL LOW (ref 12.0–15.0)
MCH: 27.2 pg (ref 26.0–34.0)
MCHC: 32.7 g/dL (ref 30.0–36.0)
MCV: 83.1 fL (ref 80.0–100.0)
Platelets: 223 10*3/uL (ref 150–400)
RBC: 4.08 MIL/uL (ref 3.87–5.11)
RDW: 14.1 % (ref 11.5–15.5)
WBC: 12.3 10*3/uL — ABNORMAL HIGH (ref 4.0–10.5)
nRBC: 0 % (ref 0.0–0.2)

## 2022-12-02 MED ORDER — POTASSIUM CHLORIDE CRYS ER 20 MEQ PO TBCR
40.0000 meq | EXTENDED_RELEASE_TABLET | Freq: Once | ORAL | Status: AC
  Administered 2022-12-02: 40 meq via ORAL
  Filled 2022-12-02: qty 2

## 2022-12-02 MED ORDER — MAGNESIUM SULFATE 2 GM/50ML IV SOLN: 2.0000 g | Freq: Once | INTRAVENOUS | Status: DC

## 2022-12-02 MED ORDER — MAGNESIUM OXIDE -MG SUPPLEMENT 400 (240 MG) MG PO TABS
400.0000 mg | ORAL_TABLET | Freq: Two times a day (BID) | ORAL | Status: AC
  Administered 2022-12-02 (×2): 400 mg via ORAL
  Filled 2022-12-02 (×2): qty 1

## 2022-12-02 MED ORDER — AMOXICILLIN-POT CLAVULANATE 875-125 MG PO TABS
1.0000 | ORAL_TABLET | Freq: Two times a day (BID) | ORAL | Status: DC
  Administered 2022-12-02 – 2022-12-03 (×3): 1 via ORAL
  Filled 2022-12-02 (×4): qty 1

## 2022-12-02 NOTE — Care Management Important Message (Signed)
Important Message  Patient Details  Name: Alexandria Leach MRN: [id number] Date of Birth: [birth year]   Medicare Important Message Given:  Yes     Dorena Bodo 12/02/2022, 4:22 PM

## 2022-12-02 NOTE — TOC Initial Note (Signed)
Transition of Care [facility]) - Initial/Assessment Note    Patient Details  Name: Alexandria Leach MRN: [id number] Date of Birth: [birth year]  Transition of [facility]) CM/SW Contact:    Leone Haven, RN Phone Number: 12/02/2022, 6:24 PM  Clinical Narrative:                 From home with spouse, has PCP, Dr. Sharen Hones  and insurance on file, states has no HH services in place at this time, she has walker, w/chair and a cane at home.  States friend  will transport her home at [employer] and family is support system, states gets medications from [place] [facility] [address] [facility] [employer] [facility].  Pta ambulatory  with cane.   Expected Discharge Plan: Home/Self Care Barriers to Discharge: No Barriers Identified   Patient Goals and CMS Choice Patient states their goals for this hospitalization and ongoing recovery are:: return home   Choice offered to / list presented to : NA      Expected Discharge Plan and Services In-house Referral: NA Discharge Planning Services: CM Consult Post Acute Care Choice: NA Living arrangements for the past 2 months: Single Family Home                 DME Arranged: N/A DME Agency: NA       HH Arranged: NA          Prior Living Arrangements/Services Living arrangements for the past 2 months: Single Family Home Lives with:: Spouse Patient language and need for interpreter reviewed:: Yes Do you feel safe going back to the place where you live?: Yes      Need for Family Participation in Patient Care: Yes (Comment) Care giver support system in place?: Yes (comment) Current home services: DME (walker, w/chair, cane) Criminal Activity/Legal Involvement Pertinent to Current Situation/Hospitalization: No - Comment as needed  Activities of Daily Living      Permission Sought/Granted Permission sought to share information with : Case Manager Permission granted to share information with : Yes, Verbal Permission Granted              Emotional Assessment    Attitude/Demeanor/Rapport: Engaged Affect (typically observed): Appropriate Orientation: : Oriented to Self, Oriented to Place, Oriented to  Time, Oriented to Situation   Psych Involvement: No (comment)  Admission diagnosis:  CHF (congestive heart failure) (HCC) [I50.9] Pneumonia [J18.9] Community acquired pneumonia [J18.9] Community acquired pneumonia, unspecified laterality [J18.9] Sepsis with acute hypoxic respiratory failure without septic shock, due to unspecified organism (HCC) [A41.9, R65.20, J96.01] Patient Active Problem List   Diagnosis Date Noted   CHF (congestive heart failure) (HCC) 11/30/2022   Pneumonia 11/29/2022   Atrial fibrillation, chronic (HCC) 11/29/2022   Community acquired pneumonia 11/29/2022   Viral URI with cough 11/28/2022   Leg pain, bilateral 01/11/2022   Rhinorrhea 10/03/2021   Pruritus 10/03/2021   Pedal edema 08/22/2021   Chronic pain of right knee 07/11/2021   Chronic GERD 01/30/2021   History of adenomatous polyp of colon 01/30/2021   Left sided abdominal pain 01/30/2021   Vitamin B1 deficiency 01/18/2021   Low serum vitamin B12 01/13/2021   Secondary hypercoagulable state (HCC) 10/23/2020   Prediabetes 07/11/2020   Snoring 06/23/2020   Atrial flutter (HCC) 06/23/2020   Dysphagia 05/15/2020   Secondary hyperparathyroidism of renal origin (HCC) 12/28/2019   Constipation 12/28/2019   Sinus tachycardia 05/27/2018   Transient alteration of awareness 04/04/2018   Abnormal serum thyroid stimulating hormone (TSH) level 01/30/2018   Subclavian artery  disease (HCC) 12/22/2017   Thyroid nodule 09/16/2017   Neck pain on right side 05/14/2017   Sialoadenitis of submandibular gland 10/29/2016   High risk medication use 06/05/2016   Peripheral neuropathy 01/30/2016   DNR no code (do not resuscitate) 12/08/2015   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   TIA (transient ischemic attack) 10/12/2015   Mild mitral regurgitation  10/12/2015   Osteopenia 09/18/2015   Right arm pain 08/29/2015   History of fall    Right sided sciatica 02/21/2015   Imbalance 01/12/2015   Transaminitis 12/24/2014   DDD (degenerative disc disease), cervical    Health maintenance examination 10/18/2014   Advanced care planning/counseling discussion 10/18/2014   Medicare annual wellness visit, subsequent 10/18/2014   Refusal of blood transfusions as patient is Jehovah's Witness    Sjogren's syndrome (HCC)    CKD (chronic kidney disease) stage 3, GFR 30-59 ml/min (HCC)    Glaucoma    Fibromyalgia    Osteoarthritis    History of pulmonary embolism    Lung nodule 09/22/2012   (HFpEF) heart failure with preserved ejection fraction (HCC) 12/04/2011   Aortic stenosis 04/22/2011   Palpitations 08/10/2010   HOT FLASHES 06/28/2008   GAD (generalized anxiety disorder) 03/28/2006   History of mitral valve repair 03/28/2006   History of total abdominal hysterectomy 03/28/2006   HYPERCHOLESTEROLEMIA 12/31/2005   MDD (major depressive disorder), recurrent episode (HCC) 12/31/2005   RHEUMATIC HEART DISEASE 12/31/2005   Essential hypertension 12/31/2005   Chronic insomnia 12/31/2005   PCP:  Eustaquio Boyden, MD Pharmacy:   [id number] [place], [place] - [address] AT [facility] [address] [facility] [address] [place] [address] Phone: [phone] Fax: [phone]  [facility] - [place], [place] - [address] [place]  [address] Phone: [phone] Fax: [phone]     Social Determinants of Health (SDOH) Social History: SDOH Screenings   Food Insecurity: No Food Insecurity (07/11/2022)  Housing: Low Risk  (07/11/2022)  Transportation Needs: Unmet Transportation Needs (07/11/2022)  Utilities: Not At Risk (02/06/2022)  Alcohol Screen: Low Risk  (02/12/2021)  Depression (PHQ2-9): Low Risk  (07/15/2022)  Financial Resource Strain: Low Risk   (07/11/2022)  Physical Activity: Insufficiently Active (07/11/2022)  Social Connections: Socially Integrated (07/11/2022)  Stress: No Stress Concern Present (07/11/2022)  Tobacco Use: Low Risk  (11/28/2022)   SDOH Interventions:     Readmission Risk Interventions     No data to display

## 2022-12-02 NOTE — Progress Notes (Signed)
Mobility Specialist Progress Note:    12/02/22 1124  Mobility  Activity Ambulated independently in hallway  Level of Assistance Modified independent, requires aide device or extra time  Assistive Device None  Distance Ambulated (ft) 250 ft  Activity Response Tolerated well  Mobility Referral Yes  $Mobility charge 1 Mobility  Mobility Specialist Start Time (ACUTE ONLY) 1042  Mobility Specialist Stop Time (ACUTE ONLY) 1052  Mobility Specialist Time Calculation (min) (ACUTE ONLY) 10 min   Received pt ambulating in room having no complaints and agreeable to mobility. Pt was asymptomatic throughout ambulation and returned to room w/o fault. Left sitting EOB w/ call bell in reach and all needs met.   Thompson Grayer Mobility Specialist  Please contact vis Secure Chat or  Rehab Office [phone]

## 2022-12-02 NOTE — Progress Notes (Signed)
PROGRESS NOTE    Alexandria Leach  JXB:[id number] DOB: November 06, 1950 DOA: 11/29/2022 PCP: Eustaquio Boyden, MD  [name] with history of chronic diastolic CHF, remote PE, chronic A-fib, Sjogren's disease, depression presented to the ED with cough congestion shortness of breath and pleuritic right-sided chest pain for 5 to 6 days, followed by nausea and vomiting yesterday. -In the ED she was noted to be mildly hypoxic, temp 99.3, labs noted WBC of 12.4 with mild lactic acidosis, BNP 242, creatinine 1.3, x-ray noted multifocal pneumonia   Subjective: -Feels better overall, breathing continues to improve, febrile to 101 last night, pain at the IV site  Assessment and Plan:  Multifocal pneumonia Sepsis, POA -Improving IV ceftriaxone and azithromycin -Flu and COVID PCR are negative, blood cultures have thus far -No risk factors for aspiration -Changed to oral Augmentin  Acute on chronic diastolic CHF -Last echo 10/23 with EF 60-65%, normal RV and moderate aortic regurgitation -Volume status improving, given IV Lasix x 1 initially, changed to oral diuretics, remains euvolemic  Left arm thrombophlebitis -Remove IV, cold compresses  Atrial fibrillation, chronic (HCC) See cardio note 10/10/2022. Diagnosed with "persitent a-flutter".  -Continue Tikosyn, diltiazem, metoprolol and Eliquis -Electrolytes, QTc stable  Sjogren's syndrome (HCC) C.w. restasis eye drop -Follow-up with rheumatology  MDD (major depressive disorder), recurrent episode (HCC) -On Xanax, continued  DVT prophylaxis: Apixaban Code Status: DNR Family Communication: None present Disposition Plan: Home likely tomorrow  Consultants:    Procedures:   Antimicrobials:    Objective: Vitals:   12/01/22 2327 12/02/22 0036 12/02/22 0500 12/02/22 0730  BP:  (!) 157/116 (!) 140/50 (!) 179/75  Pulse:  75 74 83  Resp: 20 18 20 20   Temp: 99.1 F (37.3 C) 99.4 F (37.4 C) 99.1 F (37.3 C) 97.8 F (36.6 C)  TempSrc:  Oral Oral Oral Oral  SpO2:  92% 92% 93%  Weight:   86.5 kg   Height:        Intake/Output Summary (Last 24 hours) at 12/02/2022 0951 Last data filed at 12/02/2022 1308 Gross per 24 hour  Intake 120 ml  Output --  Net 120 ml   Filed Weights   11/29/22 1502 12/01/22 0727 12/02/22 0500  Weight: 88 kg 86.6 kg 86.5 kg    Examination:  General exam: Pleasant female sitting up in bed, AAOx3, no distress HEENT: No JVD CVS: S1-S2, regular rhythm Lungs: Few scattered rhonchi otherwise clear Abdomen: Soft, nontender, bowel sounds present Extremities: No edema, mild erythema and discomfort at the site of left forearm IV Skin: No rashes Psychiatry:  Mood & affect appropriate.     Data Reviewed:   CBC: Recent Labs  Lab 11/29/22 1549 11/30/22 0057 12/01/22 0350 12/02/22 0255  WBC 12.4* 14.5* 14.3* 12.3*  NEUTROABS 10.8*  --   --   --   HGB 14.8 11.0* 11.5* 11.1*  HCT 47.2* 34.9* 36.4 33.9*  MCV 85.4 85.1 85.4 83.1  PLT 238 211 158 223   Basic Metabolic Panel: Recent Labs  Lab 11/29/22 1655 11/30/22 0057 11/30/22 1524 12/01/22 0350 12/02/22 0255  NA 135 138  --  137 137  K 4.6 4.0  --  4.9 3.7  CL 105 105  --  106 108  CO2 18* 20*  --  19* 20*  GLUCOSE 116* 128*  --  102* 120*  BUN 9 9  --  10 11  CREATININE 1.30* 1.18*  --  1.30* 1.26*  CALCIUM 9.5 9.2  --  9.4 8.9  MG  --   --  1.8 2.0  --    GFR: Estimated Creatinine Clearance: 41.5 mL/min (A) (by C-G formula based on SCr of 1.26 mg/dL (H)). Liver Function Tests: Recent Labs  Lab 11/29/22 1655  AST 38  ALT 19  ALKPHOS 64  BILITOT 1.3*  PROT 7.6  ALBUMIN 3.8   No results for input(s): "LIPASE", "AMYLASE" in the last 168 hours. No results for input(s): "AMMONIA" in the last 168 hours. Coagulation Profile: Recent Labs  Lab 11/29/22 1549 11/30/22 0057  INR 1.2 1.5*   Cardiac Enzymes: No results for input(s): "CKTOTAL", "CKMB", "CKMBINDEX", "TROPONINI" in the last 168 hours. BNP (last 3  results) No results for input(s): "PROBNP" in the last 8760 hours. HbA1C: No results for input(s): "HGBA1C" in the last 72 hours. CBG: No results for input(s): "GLUCAP" in the last 168 hours. Lipid Profile: No results for input(s): "CHOL", "HDL", "LDLCALC", "TRIG", "CHOLHDL", "LDLDIRECT" in the last 72 hours. Thyroid Function Tests: No results for input(s): "TSH", "T4TOTAL", "FREET4", "T3FREE", "THYROIDAB" in the last 72 hours. Anemia Panel: No results for input(s): "VITAMINB12", "FOLATE", "FERRITIN", "TIBC", "IRON", "RETICCTPCT" in the last 72 hours. Urine analysis:    Component Value Date/Time   COLORURINE YELLOW 11/29/2022 1549   APPEARANCEUR CLEAR 11/29/2022 1549   LABSPEC 1.020 11/29/2022 1549   PHURINE 7.0 11/29/2022 1549   GLUCOSEU NEGATIVE 11/29/2022 1549   HGBUR TRACE (A) 11/29/2022 1549   BILIRUBINUR NEGATIVE 11/29/2022 1549   BILIRUBINUR negative 07/16/2018 1017   KETONESUR NEGATIVE 11/29/2022 1549   PROTEINUR NEGATIVE 11/29/2022 1549   UROBILINOGEN 0.2 08/07/2018 1249   NITRITE NEGATIVE 11/29/2022 1549   LEUKOCYTESUR TRACE (A) 11/29/2022 1549   Sepsis Labs: @LABRCNTIP (procalcitonin:4,lacticidven:4)  ) Recent Results (from the past 240 hour(s))  Resp panel by RT-PCR (RSV, Flu A&B, Covid) Anterior Nasal Swab     Status: None   Collection Time: 11/29/22  3:49 PM   Specimen: Anterior Nasal Swab  Result Value Ref Range Status   SARS Coronavirus 2 by RT PCR NEGATIVE NEGATIVE Final   Influenza A by PCR NEGATIVE NEGATIVE Final   Influenza B by PCR NEGATIVE NEGATIVE Final    Comment: (NOTE) The Xpert Xpress SARS-CoV-2/FLU/RSV plus assay is intended as an aid in the diagnosis of influenza from Nasopharyngeal swab specimens and should not be used as a sole basis for treatment. Nasal washings and aspirates are unacceptable for Xpert Xpress SARS-CoV-2/FLU/RSV testing.  Fact Sheet for Patients: BloggerCourse.com  Fact Sheet for Healthcare  Providers: SeriousBroker.it  This test is not yet approved or cleared by the [place] FDA and has been authorized for detection and/or diagnosis of SARS-CoV-2 by FDA under an Emergency Use Authorization (EUA). This EUA will remain in effect (meaning this test can be used) for the duration of the COVID-19 declaration under Section 564(b)(1) of the Act, 21 U.S.C. section 360bbb-3(b)(1), unless the authorization is terminated or revoked.     Resp Syncytial Virus by PCR NEGATIVE NEGATIVE Final    Comment: (NOTE) Fact Sheet for Patients: BloggerCourse.com  Fact Sheet for Healthcare Providers: SeriousBroker.it  This test is not yet approved or cleared by the [place] FDA and has been authorized for detection and/or diagnosis of SARS-CoV-2 by FDA under an Emergency Use Authorization (EUA). This EUA will remain in effect (meaning this test can be used) for the duration of the COVID-19 declaration under Section 564(b)(1) of the Act, 21 U.S.C. section 360bbb-3(b)(1), unless the authorization is terminated or revoked.  Performed at [facility], [address], [place],  [place] [address]   Blood Culture (routine x 2)     Status: None (Preliminary result)   Collection Time: 11/29/22  3:51 PM   Specimen: BLOOD RIGHT ARM  Result Value Ref Range Status   Specimen Description BLOOD RIGHT ARM  Final   Special Requests   Final    BOTTLES DRAWN AEROBIC AND ANAEROBIC Blood Culture adequate volume   Culture   Final    NO GROWTH 3 DAYS Performed at [facility], [address], [place], [place] [address]    Report Status PENDING  Incomplete  Blood Culture (routine x 2)     Status: None (Preliminary result)   Collection Time: 11/30/22 12:57 AM   Specimen: BLOOD RIGHT HAND  Result Value Ref Range Status   Specimen Description BLOOD RIGHT HAND  Final   Special Requests   Final    BOTTLES DRAWN  AEROBIC AND ANAEROBIC Blood Culture results may not be optimal due to an excessive volume of blood received in culture bottles   Culture   Final    NO GROWTH 2 DAYS Performed at [facility], [address], [place], [place] [address]    Report Status PENDING  Incomplete     Radiology Studies: No results found.   Scheduled Meds:  amoxicillin-clavulanate  1 tablet Oral Q12H   apixaban  5 mg Oral BID   cholecalciferol  1,000 Units Oral Daily   cyanocobalamin  1,000 mcg Oral Weekly   cycloSPORINE  1 drop Both Eyes BID   diltiazem  360 mg Oral Daily   docusate sodium  100 mg Oral Daily   dofetilide  250 mcg Oral BID   ezetimibe  10 mg Oral QHS   furosemide  60 mg Oral Daily   gabapentin  300 mg Oral Q24H   gabapentin  600 mg Oral BID   metoprolol tartrate  75 mg Oral BID   pantoprazole  40 mg Oral Daily   sodium chloride flush  3 mL Intravenous Q12H   thiamine  100 mg Oral Weekly   Continuous Infusions:     LOS: 3 days    Time spent:    Zannie Cove, MD [facility]   12/02/2022, 9:51 AM

## 2022-12-02 NOTE — Consult Note (Signed)
[facility]  [facility] Inpatient Consult   12/02/2022  Alexandria Leach [facility] 06-Mar-1951 [id number]    RN Hospital Liaison met patient at bedside at [facility]. Spoke with patient and spouse at the bedside to explain reason for rounding visit to check for post hospital follow up needs when returning to community.  Patient endorses PCP and for post hospital follow up calls.  SDOH reviewed and intact, has follow up for any needs.  Primary Care Provider:  Eustaquio Boyden, MD with [place] [facility] which is listed to provide the transition of care follow up  Insurance: [employer] [Dual]   The patient was screened for  hospitalization with noted medium risk score for unplanned readmission risk admitted with pneumonia.  The patient was assessed for potential [facility]) Care Management service needs for post hospital transition for care coordination. Plan:  [facility] Liaison will continue to follow progress and disposition to asess for post hospital community care coordination/management needs.  Referral request for community care coordination: anticipate [facility] Transitions of Care Team follow up. Will check for additional needs    [facility] Care Management/Population Health does not replace or interfere with any arrangements made by the Inpatient Transition of Care team.   For questions contact:   Charlesetta Shanks, RN, BSN, CCM [place]  [facility], [facility] Liaison Direct Dial: [phone] or secure chat Website: [name]

## 2022-12-03 ENCOUNTER — Other Ambulatory Visit (HOSPITAL_COMMUNITY): Payer: Self-pay

## 2022-12-03 DIAGNOSIS — J189 Pneumonia, unspecified organism: Secondary | ICD-10-CM | POA: Diagnosis not present

## 2022-12-03 LAB — MAGNESIUM: Magnesium: 2 mg/dL (ref 1.7–2.4)

## 2022-12-03 MED ORDER — FUROSEMIDE 20 MG PO TABS
40.0000 mg | ORAL_TABLET | Freq: Every day | ORAL | 0 refills | Status: DC
  Filled 2022-12-03: qty 60, 30d supply, fill #0

## 2022-12-03 MED ORDER — POTASSIUM CHLORIDE CRYS ER 20 MEQ PO TBCR
20.0000 meq | EXTENDED_RELEASE_TABLET | Freq: Every day | ORAL | 1 refills | Status: DC
  Filled 2022-12-03: qty 60, 60d supply, fill #0

## 2022-12-03 MED ORDER — AMOXICILLIN-POT CLAVULANATE 875-125 MG PO TABS
1.0000 | ORAL_TABLET | Freq: Two times a day (BID) | ORAL | 0 refills | Status: AC
  Filled 2022-12-03: qty 6, 3d supply, fill #0

## 2022-12-03 MED ORDER — LOSARTAN POTASSIUM 100 MG PO TABS: 50.0000 mg | ORAL_TABLET | Freq: Every day | ORAL | Status: DC

## 2022-12-03 NOTE — Plan of Care (Signed)
  Problem: Education: Goal: Knowledge of General Education information will improve Description: Including pain rating scale, medication(s)/side effects and non-pharmacologic comfort measures Outcome: Progressing   Problem: Health Behavior/Discharge Planning: Goal: Ability to manage health-related needs will improve Outcome: Progressing   Problem: Clinical Measurements: Goal: Will remain free from infection Outcome: Progressing Goal: Diagnostic test results will improve Outcome: Progressing   Problem: Activity: Goal: Risk for activity intolerance will decrease Outcome: Progressing   Problem: Nutrition: Goal: Adequate nutrition will be maintained Outcome: Progressing   Problem: Safety: Goal: Ability to remain free from injury will improve Outcome: Progressing   Problem: Skin Integrity: Goal: Risk for impaired skin integrity will decrease Outcome: Progressing

## 2022-12-03 NOTE — TOC Transition Note (Addendum)
Transition of Care [facility]) - CM/SW Discharge Note   Patient Details  Name: Alexandria Leach MRN: [id number] Date of Birth: 1951-02-07  Transition of Care [facility]) CM/SW Contact:  Leone Haven, RN Phone Number: 12/03/2022, 9:48 AM   Clinical Narrative:    Patient is for dc today, she has no needs. TOC to fill meds. Spouse to transport home. Awaiting Mag results.    Final next level of care: Home/Self Care Barriers to Discharge: No Barriers Identified   Patient Goals and CMS Choice   Choice offered to / list presented to : NA  Discharge Placement                         Discharge Plan and Services Additional resources added to the After Visit Summary for   In-house Referral: NA Discharge Planning Services: CM Consult Post Acute Care Choice: NA          DME Arranged: N/A DME Agency: NA       HH Arranged: NA          Social Determinants of Health (SDOH) Interventions SDOH Screenings   Food Insecurity: No Food Insecurity (07/11/2022)  Housing: Low Risk  (07/11/2022)  Transportation Needs: Unmet Transportation Needs (07/11/2022)  Utilities: Not At Risk (02/06/2022)  Alcohol Screen: Low Risk  (02/12/2021)  Depression (PHQ2-9): Low Risk  (07/15/2022)  Financial Resource Strain: Low Risk  (07/11/2022)  Physical Activity: Insufficiently Active (07/11/2022)  Social Connections: Socially Integrated (07/11/2022)  Stress: No Stress Concern Present (07/11/2022)  Tobacco Use: Low Risk  (11/28/2022)     Readmission Risk Interventions     No data to display

## 2022-12-04 ENCOUNTER — Telehealth: Payer: Self-pay | Admitting: *Deleted

## 2022-12-04 LAB — CULTURE, BLOOD (ROUTINE X 2)
Culture: NO GROWTH
Special Requests: ADEQUATE

## 2022-12-04 NOTE — Transitions of Care (Post Inpatient/ED Visit) (Signed)
12/04/2022  Name: Alexandria Leach MRN: [id number] DOB: 25-Mar-1950  Today's TOC FU Call Status:   Patient's Name and Date of Birth confirmed.  Transition Care Management Follow-up Telephone Call Date of Discharge: 12/03/22 Discharge Facility: [facility]) Type of Discharge: Inpatient Admission Primary Inpatient Discharge Diagnosis:: pneumonia How have you been since you were released from the hospital?: Better (Weak and moving slow) Any questions or concerns?: No  Items Reviewed: Did you receive and understand the discharge instructions provided?: Yes Medications obtained,verified, and reconciled?: Yes (Medications Reviewed) Any new allergies since your discharge?: No Dietary orders reviewed?: No Do you have support at home?: Yes People in Home: spouse Name of Support/Comfort Primary Source: [name] son-children  Medications Reviewed Today: Medications Reviewed Today     Reviewed by Luella Cook, RN (Case Manager) on 12/04/22 at 1227  Med List Status: <None>   Medication Order Taking? Sig Documenting Provider Last Dose Status Informant  ALPRAZolam (XANAX) 0.5 MG tablet [id number] Yes Take 1 tablet (0.5 mg total) by mouth 3 (three) times daily as needed for anxiety. Myrlene Broker, MD Taking Active Self  amoxicillin-clavulanate (AUGMENTIN) 875-125 MG tablet [id number] Yes Take 1 tablet by mouth every 12 (twelve) hours for 3 days. Zannie Cove, MD Taking Active   antiseptic oral rinse Lamont Dowdy) LIQD [id number] Yes 15 mLs by Mouth Rinse route 2 (two) times daily as needed for dry mouth. [provider] Taking Active Self  cholecalciferol (VITAMIN D) 1000 UNITS tablet [id number] Yes Take 1,000 Units by mouth daily. [provider] Taking Active Self  cyanocobalamin (VITAMIN B12) 1000 MCG tablet [id number] Yes Take 1 tablet (1,000 mcg total) by mouth once a week. Eustaquio Boyden, MD Taking Active Self           Med Note (CARD, AMY L   Fri Nov 29, 2022   9:38 PM) Leanora Ivanoff on Friday.  diltiazem (CARDIZEM CD) 360 MG 24 hr capsule [id number] Yes TAKE 1 CAPSULE BY MOUTH DAILY Crenshaw, Madolyn Frieze, MD Taking Active Self  docusate sodium (COLACE) 100 MG capsule [id number] Yes Take 100 mg by mouth daily. [provider] Taking Active Self  dofetilide (TIKOSYN) 250 MCG capsule [id number] Yes TAKE 1 CAPSULE(250 MCG) BY MOUTH TWICE DAILY  Patient taking differently: Take 250 mcg by mouth 2 (two) times daily.   Fenton, [place] R, PA Taking Active Self  ELIQUIS 5 MG TABS tablet [id number] Yes TAKE 1 TABLET BY MOUTH TWICE  DAILY Newman Nip, NP Taking Active Self  ezetimibe (ZETIA) 10 MG tablet [id number] Yes TAKE 1 TABLET BY MOUTH AT  BEDTIME Eustaquio Boyden, MD Taking Active Self  furosemide (LASIX) 20 MG tablet [id number] Yes Take 2 tablets (40 mg total) by mouth daily. Zannie Cove, MD Taking Active   gabapentin (NEURONTIN) 300 MG capsule [id number] Yes TAKE 2 CAPSULES BY MOUTH IN THE  MORNING 1 CAPSULE BY MOUTH IN  THE AFTERNOON AND 2 CAPSULES BY  MOUTH AT BEDTIME  Patient taking differently: Take 300 mg by mouth 3 (three) times daily. TAKE 2 CAPSULES BY MOUTH IN THE MORNING 1 CAPSULE BY  MOUTH IN THE AFTERNOON AND  2 CAPSULES BY MOUTH AT  BEDTIME   Eustaquio Boyden, MD Taking Active Self  loratadine (CLARITIN) 10 MG tablet [id number] Yes Take 1 tablet (10 mg total) by mouth daily. Eustaquio Boyden, MD Taking Active Self  losartan (COZAAR) 100 MG tablet [id number] Yes Take 0.5 tablets (50 mg total) by mouth daily. Zannie Cove, MD Taking Active  metoprolol tartrate (LOPRESSOR) 50 MG tablet [id number] Yes TAKE 1 AND 1/2 TABLETS BY MOUTH  TWICE DAILY Lewayne Bunting, MD Taking Active   pantoprazole (PROTONIX) 40 MG tablet [id number] Yes TAKE 1 TABLET BY MOUTH DAILY Eustaquio Boyden, MD Taking Active Self  Polyethyl Glycol-Propyl Glycol (SYSTANE) 0.4-0.3 % GEL ophthalmic gel [id number] Yes Place 1 drop into both eyes 2 (two) times daily.  [provider] Taking Active Self  potassium chloride SA (KLOR-CON M) 20 MEQ tablet [id number] Yes Take 1 tablet (20 mEq total) by mouth daily. Zannie Cove, MD Taking Active   RESTASIS 0.05 % ophthalmic emulsion [id number] Yes INSTILL ONE DROP IN BOTH  EYES TWO TIMES DAILY Eustaquio Boyden, MD Taking Active Self  temazepam (RESTORIL) 30 MG capsule [id number] Yes TAKE 1 CAPSULE(30 MG) BY MOUTH AT BEDTIME AS NEEDED FOR SLEEP Myrlene Broker, MD Taking Active Self  thiamine (VITAMIN B1) 100 MG tablet [id number] Yes Take 1 tablet (100 mg total) by mouth once a week. Eustaquio Boyden, MD Taking Active Self           Med Note (CARD, AMY L   Fri Nov 29, 2022  9:41 PM) Leanora Ivanoff on Mondays.  traMADol (ULTRAM) 50 MG tablet [id number] Yes TAKE 1 TABLET BY MOUTH DAILY AS  NEEDED Deveshwar, Janalyn Rouse, MD Taking Active Self            Home Care and Equipment/Supplies: Were Home Health Services Ordered?: NA Any new equipment or medical supplies ordered?: NA  Functional Questionnaire: Do you need assistance with bathing/showering or dressing?: No Do you need assistance with meal preparation?: Yes Do you have difficulty maintaining continence: No Do you need assistance with getting out of bed/getting out of a chair/moving?: No Do you have difficulty managing or taking your medications?: No  Follow up appointments reviewed: PCP Follow-up appointment confirmed?: Yes Date of PCP follow-up appointment?: 12/18/22 Follow-up Provider: Dr Sharen Hones [facility] Follow-up appointment confirmed?: Yes Date of Specialist follow-up appointment?: 12/26/22 Follow-Up Specialty Provider:: Joni Reining Do you need transportation to your follow-up appointment?: No Do you understand care options if your condition(s) worsen?: Yes-patient verbalized understanding  SDOH Interventions Today    Flowsheet Row Most Recent Value  SDOH Interventions   Food Insecurity Interventions Intervention Not Indicated   Housing Interventions Intervention Not Indicated  Transportation Interventions Intervention Not Indicated, Patient Resources (Friends/Family)      Interventions Today    Flowsheet Row Most Recent Value  General Interventions   General Interventions Discussed/Reviewed General Interventions Discussed, General Interventions Reviewed, Doctor Visits, Referral to Nurse  [referred to Care Coordinator Davina Green]  Doctor Visits Discussed/Reviewed Doctor Visits Discussed, Doctor Visits Reviewed  Pharmacy Interventions   Pharmacy Dicussed/Reviewed Pharmacy Topics Discussed      TOC Interventions Today    Flowsheet Row Most Recent Value  TOC Interventions   TOC Interventions Discussed/Reviewed TOC Interventions Reviewed, TOC Interventions Discussed      Referred to [name] f/u appt [id number] 3:00 Gean Maidens BSN RN [facility] [phone]

## 2022-12-04 NOTE — Transitions of Care (Post Inpatient/ED Visit) (Signed)
12/04/2022  Name: Anays Dowler MRN: [id number] DOB: 1950/11/01  Today's TOC FU Call Status: Today's TOC FU Call Status:: Unsuccessful Call (1st Attempt) Unsuccessful Call (1st Attempt) Date: 12/04/22  Attempted to reach the patient regarding the most recent Inpatient/ED visit.  Follow Up Plan: Additional outreach attempts will be made to reach the patient to complete the Transitions of Care (Post Inpatient/ED visit) call.   Gean Maidens BSN RN [facility] [phone]

## 2022-12-05 LAB — CULTURE, BLOOD (ROUTINE X 2): Culture: NO GROWTH

## 2022-12-09 ENCOUNTER — Ambulatory Visit: Payer: Self-pay

## 2022-12-09 NOTE — Patient Outreach (Signed)
Care Coordination   Initial Visit Note   12/09/2022 Name: Alexandria Leach MRN: [id number] DOB: 1951/01/23  Margot Pfingsten Haggar is a 72 y.o. year old female who sees Eustaquio Boyden, MD for primary care. I spoke with  Oren Binet Martino by phone today.  What matters to the patients health and wellness today?  Per chart review patient hospitalization 11/30/22 to 12/03/22 for pneumonia.  Patient states, " I am doing ok, just tired."  Patient states she completed her antibiotic.  She reports shortness of breath is better unless she over exerts herself.   Patient reports having ongoing cough which seems worse later evening. Denies much phelgm production.   Patient states she is having a hard time taking the potassium pill prescribed for her.  She states she has choked on it twice.  Patient states she is scheduled for a post hospital follow up visit with her primary care provider on 12/18/22.    Goals Addressed             This Visit's Progress    Ongoing improvement post hospitalization for pneumonia       Interventions Today    Flowsheet Row Most Recent Value  Chronic Disease   Chronic disease during today's visit Other  [pneumonia]  General Interventions   General Interventions Discussed/Reviewed General Interventions Reviewed, Doctor Visits  [evaluation of treatment plan for pneumonia and patients adherence to plan as established by provider.  Assessed for ongoing pneumonia symptoms.]  Doctor Visits Discussed/Reviewed Doctor Visits Reviewed  Algis Downs to keep follow up appointments with provider.  Confirmed patient has transportation to her appointments. Confirmed patient has post hospital follow up visit scheduled.]  Exercise Interventions   Exercise Discussed/Reviewed Physical Activity  [Advised to do household activites when feeling more energy during the day.  Take frequent rest periods.  Encouraged that fatigue should get better over time.]  Education Interventions    Education Provided Provided Education  Provided Verbal Education On When to see the doctor  [Advised to notify provider for increase shortness of breath, chest pain, color change of phlegm/brown/ Camia Dipinto, fever]  Nutrition Interventions   Nutrition Discussed/Reviewed Nutrition Reviewed, Increasing proteins, Adding fruits and vegetables  Pharmacy Interventions   Pharmacy Dicussed/Reviewed Pharmacy Topics Reviewed  Algis Downs to take medications as prescribed.  Advised to send message to primary care provider notify she is having difficulty taking her potassium pill. Confirmed patient completed prescribed antibiotic in its entirety]              SDOH assessments and interventions completed:  Yes  SDOH Interventions Today    Flowsheet Row Most Recent Value  SDOH Interventions   Food Insecurity Interventions Intervention Not Indicated  Housing Interventions Intervention Not Indicated  Transportation Interventions Intervention Not Indicated        Care Coordination Interventions:  Yes, provided   Follow up plan: Follow up call scheduled for 01/01/23    Encounter Outcome:  Patient Visit Completed   [name] RN,BSN,CCM [facility] Care Coordination [phone] direct line

## 2022-12-09 NOTE — Patient Instructions (Signed)
Visit Information  Thank you for taking time to visit with me today. Please don't hesitate to contact me if I can be of assistance to you.   Following are the goals we discussed today:   Goals Addressed             This Visit's Progress    Ongoing improvement post hospitalization for pneumonia       Interventions Today    Flowsheet Row Most Recent Value  Chronic Disease   Chronic disease during today's visit Other  [pneumonia]  General Interventions   General Interventions Discussed/Reviewed General Interventions Reviewed, Doctor Visits  [evaluation of treatment plan for pneumonia and patients adherence to plan as established by provider.  Assessed for ongoing pneumonia symptoms.]  Doctor Visits Discussed/Reviewed Doctor Visits Reviewed  Algis Downs to keep follow up appointments with provider.  Confirmed patient has transportation to her appointments. Confirmed patient has post hospital follow up visit scheduled.]  Exercise Interventions   Exercise Discussed/Reviewed Physical Activity  [Advised to do household activites when feeling more energy during the day.  Take frequent rest periods.  Encouraged that fatigue should get better over time.]  Education Interventions   Education Provided Provided Education  Provided Verbal Education On When to see the doctor  [Advised to notify provider for increase shortness of breath, chest pain, color change of phlegm/brown/ Dewane Timson, fever]  Nutrition Interventions   Nutrition Discussed/Reviewed Nutrition Reviewed, Increasing proteins, Adding fruits and vegetables  Pharmacy Interventions   Pharmacy Dicussed/Reviewed Pharmacy Topics Reviewed  Algis Downs to take medications as prescribed.  Advised to send message to primary care provider notify she is having difficulty taking her potassium pill. Confirmed patient completed prescribed antibiotic in its entirety]              Our next appointment is by telephone on 01/01/23 at 3 pm  Please call the  care guide team at [phone] if you need to cancel or reschedule your appointment.   If you are experiencing a Mental Health or Behavioral Health Crisis or need someone to talk to, please call the Suicide and Crisis Lifeline: 988 call 1-800-273-TALK (toll free, 24 hour hotline)  Patient verbalizes understanding of instructions and care plan provided today and agrees to view in MyChart. Active MyChart status and patient understanding of how to access instructions and care plan via MyChart confirmed with patient.     [name] RN,BSN,CCM [facility] Care Coordination [phone] direct line

## 2022-12-11 ENCOUNTER — Other Ambulatory Visit: Payer: Self-pay | Admitting: Internal Medicine

## 2022-12-12 ENCOUNTER — Other Ambulatory Visit (HOSPITAL_COMMUNITY): Payer: Self-pay | Admitting: *Deleted

## 2022-12-12 MED ORDER — DOFETILIDE 250 MCG PO CAPS: 250.0000 ug | ORAL_CAPSULE | Freq: Two times a day (BID) | ORAL | 2 refills | Status: DC

## 2022-12-13 NOTE — Discharge Summary (Signed)
Physician Discharge Summary  Alexandria Leach ZDG:[id number] DOB: 01-Sep-1950 DOA: 11/29/2022  PCP: Alexandria Boyden, MD  Admit date: 11/29/2022 Discharge date: 12/03/2022  Time spent: 45 minutes  Recommendations for Outpatient Follow-up:  PCP in 1 week, follow-up chest x-ray in 6 weeks   Discharge Diagnoses:  Principal Problem: Multifocal pneumonia Sepsis present on admission Acute on chronic diastolic CHF Left arm thrombophlebitis   MDD (major depressive disorder), recurrent episode (HCC)   (HFpEF) heart failure with preserved ejection fraction (HCC)   Sjogren's syndrome (HCC)   Advanced care planning/counseling discussion   Atrial fibrillation, chronic (HCC)   Community acquired pneumonia   CHF (congestive heart failure) (HCC)   Discharge Condition: Improved  Diet recommendation: [place] DM, heart healthy  Filed Weights   12/01/22 0727 12/02/22 0500 12/03/22 0410  Weight: 86.6 kg 86.5 kg 85.7 kg    History of present illness:  [name] with history of chronic diastolic CHF, remote PE, chronic A-fib, Sjogren's disease, depression presented to the ED with cough congestion shortness of breath and pleuritic right-sided chest pain for 5 to 6 days, followed by nausea and vomiting yesterday. -In the ED she was noted to be mildly hypoxic, temp 99.3, labs noted WBC of 12.4 with mild lactic acidosis, BNP 242, creatinine 1.3, x-ray noted multifocal pneumonia  Hospital Course:   Multifocal pneumonia Sepsis, POA -Treated with IV ceftriaxone and azithromycin -Flu and COVID PCR are negative, blood cultures have thus far -No risk factors for aspiration -Changed to oral Augmentin -Follow-up with PCP in 1 week, repeat chest x-ray in 6 weeks   Acute on chronic diastolic CHF -Last echo 10/23 with EF 60-65%, normal RV and moderate aortic regurgitation -Volume status improving, given IV Lasix x 1 initially, changed to oral diuretics, remains euvolemic -Continue losartan, metoprolol    Left arm thrombophlebitis -Removed IV, cold compresses -Improving   Chronic atrial fibrillation/flutter  See cardio note 10/10/2022. Diagnosed with "persitent a-flutter".  -Continue Tikosyn, diltiazem, metoprolol and Eliquis -Electrolytes, QTc stable   Sjogren's syndrome (HCC) C.w. restasis eye drop -Follow-up with rheumatology   MDD (major depressive disorder), recurrent episode (HCC) -On Xanax, continued   Discharge Exam: Vitals:   12/03/22 0410 12/03/22 0725  BP: 135/64 (!) 157/71  Pulse: 83 89  Resp: 20 (!) 22  Temp: 99.2 F (37.3 C) 97.8 F (36.6 C)  SpO2: 94% 91%   General exam: Pleasant female sitting up in bed, AAOx3, no distress HEENT: No JVD CVS: S1-S2, regular rhythm Lungs: Few scattered rhonchi otherwise clear Abdomen: Soft, nontender, bowel sounds present Extremities: No edema, mild erythema and discomfort at the site of left forearm IV Skin: No rashes Psychiatry:  Mood & affect appropriate.   Discharge Instructions   Discharge Instructions     Diet - low sodium heart healthy   Complete by: As directed    Increase activity slowly   Complete by: As directed       Allergies as of 12/03/2022       Reactions   Cymbalta [duloxetine Hcl] Other (See Comments)   tachycardia   Statins Nausea Only, Other (See Comments)   Muscle cramps also   Sulfa Antibiotics Nausea And Vomiting        Medication List     STOP taking these medications    potassium chloride 10 MEQ tablet Commonly known as: KLOR-CON Replaced by: potassium chloride SA 20 MEQ tablet       TAKE these medications    ALPRAZolam 0.5 MG tablet Commonly known as: Xanax  Take 1 tablet (0.5 mg total) by mouth 3 (three) times daily as needed for anxiety.   antiseptic oral rinse Liqd 15 mLs by Mouth Rinse route 2 (two) times daily as needed for dry mouth.   cholecalciferol 1000 units tablet Commonly known as: VITAMIN D Take 1,000 Units by mouth daily.   cyanocobalamin 1000 MCG  tablet Commonly known as: VITAMIN B12 Take 1 tablet (1,000 mcg total) by mouth once a week.   diltiazem 360 MG 24 hr capsule Commonly known as: CARDIZEM CD TAKE 1 CAPSULE BY MOUTH DAILY   docusate sodium 100 MG capsule Commonly known as: COLACE Take 100 mg by mouth daily.   Eliquis 5 MG Tabs tablet Generic drug: apixaban TAKE 1 TABLET BY MOUTH TWICE  DAILY   ezetimibe 10 MG tablet Commonly known as: ZETIA TAKE 1 TABLET BY MOUTH AT  BEDTIME   furosemide 20 MG tablet Commonly known as: LASIX Take 2 tablets (40 mg total) by mouth daily. What changed: See the new instructions.   gabapentin 300 MG capsule Commonly known as: NEURONTIN TAKE 2 CAPSULES BY MOUTH IN THE  MORNING 1 CAPSULE BY MOUTH IN  THE AFTERNOON AND 2 CAPSULES BY  MOUTH AT BEDTIME What changed: See the new instructions.   loratadine 10 MG tablet Commonly known as: CLARITIN Take 1 tablet (10 mg total) by mouth daily.   losartan 100 MG tablet Commonly known as: COZAAR Take 0.5 tablets (50 mg total) by mouth daily. What changed: how much to take   metoprolol tartrate 50 MG tablet Commonly known as: LOPRESSOR TAKE 1 AND 1/2 TABLETS BY MOUTH  TWICE DAILY   pantoprazole 40 MG tablet Commonly known as: PROTONIX TAKE 1 TABLET BY MOUTH DAILY   Polyethyl Glycol-Propyl Glycol 0.4-0.3 % Gel ophthalmic gel Commonly known as: SYSTANE Place 1 drop into both eyes 2 (two) times daily.   potassium chloride SA 20 MEQ tablet Commonly known as: KLOR-CON M Take 1 tablet (20 mEq total) by mouth daily. Replaces: potassium chloride 10 MEQ tablet   Restasis 0.05 % ophthalmic emulsion Generic drug: cycloSPORINE INSTILL ONE DROP IN BOTH  EYES TWO TIMES DAILY   temazepam 30 MG capsule Commonly known as: RESTORIL TAKE 1 CAPSULE(30 MG) BY MOUTH AT BEDTIME AS NEEDED FOR SLEEP   thiamine 100 MG tablet Commonly known as: VITAMIN B1 Take 1 tablet (100 mg total) by mouth once a week.   traMADol 50 MG tablet Commonly known  as: ULTRAM TAKE 1 TABLET BY MOUTH DAILY AS  NEEDED       ASK your doctor about these medications    amoxicillin-clavulanate 875-125 MG tablet Commonly known as: AUGMENTIN Take 1 tablet by mouth every 12 (twelve) hours for 3 days. Ask about: Should I take this medication?       Allergies  Allergen Reactions   Cymbalta [Duloxetine Hcl] Other (See Comments)    tachycardia   Statins Nausea Only and Other (See Comments)    Muscle cramps also   Sulfa Antibiotics Nausea And Vomiting    Follow-up Information     Alexandria Boyden, MD. Go on 12/18/2022.   Specialty: Family Medicine Why: @9 :30am Contact information: [address] [place] [address] [phone]                  The results of significant diagnostics from this hospitalization (including imaging, microbiology, ancillary and laboratory) are listed below for reference.    Significant Diagnostic Studies: DG Chest [facility] 1 View  Result  Date: 11/29/2022 CLINICAL DATA:  Shortness of breath EXAM: PORTABLE CHEST 1 VIEW COMPARISON:  11/02/2017 FINDINGS: Patchy bilateral mid/lower lung opacities, suspicious for pneumonia. No pleural effusion or pneumothorax. The heart is top-normal in size. Median sternotomy. IMPRESSION: Multifocal pneumonia, lower lobe predominant. Electronically Signed   By: Charline Bills M.D.   On: 11/29/2022 17:20    Microbiology: No results found for this or any previous visit (from the past 240 hour(s)).   Labs: Basic Metabolic Panel: No results for input(s): "NA", "K", "CL", "CO2", "GLUCOSE", "BUN", "CREATININE", "CALCIUM", "MG", "PHOS" in the last 168 hours. Liver Function Tests: No results for input(s): "AST", "ALT", "ALKPHOS", "BILITOT", "PROT", "ALBUMIN" in the last 168 hours. No results for input(s): "LIPASE", "AMYLASE" in the last 168 hours. No results for input(s): "AMMONIA" in the last 168 hours. CBC: No results for input(s): "WBC", "NEUTROABS", "HGB", "HCT", "MCV",  "PLT" in the last 168 hours. Cardiac Enzymes: No results for input(s): "CKTOTAL", "CKMB", "CKMBINDEX", "TROPONINI" in the last 168 hours. BNP: BNP (last 3 results) Recent Labs    11/29/22 1557  BNP 242.7*    ProBNP (last 3 results) No results for input(s): "PROBNP" in the last 8760 hours.  CBG: No results for input(s): "GLUCAP" in the last 168 hours.     Signed:  Zannie Cove MD.  [facility] 12/13/2022, 4:54 PM

## 2022-12-18 ENCOUNTER — Ambulatory Visit (INDEPENDENT_AMBULATORY_CARE_PROVIDER_SITE_OTHER): Payer: 59 | Admitting: Family Medicine

## 2022-12-18 ENCOUNTER — Encounter: Payer: Self-pay | Admitting: Family Medicine

## 2022-12-18 VITALS — BP 128/78 | HR 76 | Temp 98.7°F | Ht 61.0 in | Wt 189.6 lb

## 2022-12-18 DIAGNOSIS — I5032 Chronic diastolic (congestive) heart failure: Secondary | ICD-10-CM

## 2022-12-18 DIAGNOSIS — R1310 Dysphagia, unspecified: Secondary | ICD-10-CM | POA: Diagnosis not present

## 2022-12-18 DIAGNOSIS — M3501 Sicca syndrome with keratoconjunctivitis: Secondary | ICD-10-CM | POA: Diagnosis not present

## 2022-12-18 DIAGNOSIS — I808 Phlebitis and thrombophlebitis of other sites: Secondary | ICD-10-CM | POA: Insufficient documentation

## 2022-12-18 DIAGNOSIS — J189 Pneumonia, unspecified organism: Secondary | ICD-10-CM

## 2022-12-18 DIAGNOSIS — K219 Gastro-esophageal reflux disease without esophagitis: Secondary | ICD-10-CM

## 2022-12-18 MED ORDER — POTASSIUM CHLORIDE CRYS ER 10 MEQ PO TBCR: 10.0000 meq | EXTENDED_RELEASE_TABLET | Freq: Two times a day (BID) | ORAL | 1 refills | Status: DC

## 2022-12-18 NOTE — Assessment & Plan Note (Signed)
Seems euvolemic today on new lasix dose of 40mg  daily.

## 2022-12-18 NOTE — Progress Notes (Signed)
Ph: [phone] Fax: [phone]   Patient ID: Alexandria Leach, female    DOB: 17-Oct-1950, 72 y.o.   MRN: [id number]  This visit was conducted in person.  BP 128/78 (BP Location: Right Arm, Patient Position: Sitting, Cuff Size: Normal)   Pulse 76   Temp 98.7 F (37.1 C) (Oral)   Ht 5\' 1"  (1.549 m)   Wt 189 lb 9.6 oz (86 kg)   SpO2 98%   BMI 35.82 kg/m    CC: hosp f/u visit - too late for TCM Subjective:   HPI: Alexandria Leach is a 72 y.o. female presenting on 12/18/2022 for Hospitalization Follow-up (F/u for Pneumonia & CHF. Patient had a questions about a knot on her left arm where her IV was. Wants to discuss RSV vaccine as well.)   Recent hospitalization for pneumonia presenting with cough, congestion, dyspnea, and pleuritic right sided chest pain along with nausea and vomiting. Found to be mildly hyoxic with WBC 12.4. CXR showed multifocal pneumonia.  Hospital records reviewed. Med rec performed.  Initially treated with IV ceftriaxone and azithromycin, tested negative for flu and COVID, blood cultures negative x2. Transitioned to oral augmentin. Completed antibiotic course. Notes ongoing cough without significant sputum production.   Had left arm thrombophlebitis at IV site, treated with cold compresses. Knot persists but is no longer tender.   Persistent atrial flutter - continues tikosyn, diltiazem, metoprolol and eliquis.   Was prescribed potassium on discharge however she states she has gotten choked twice attempting to take oral potassium. She's chosen not to take the medication for now. Notes progressive dysphagia with solid foods like bread, as well as large potassium pills. She notes she had more ease with potassium dose.  Lasix dose was dropped from 60mg  to 40mg .   She is now off restoril but continues xanax 0.5mg  twice daily. Next psych appt is 02/22/2023. Overall she feels she's sleeping.   Known sjogren's syndrome followed by Dr  Loni Beckwith  Home health not set up.  Other follow up appointments scheduled: Joni Reining NP cardiology 12/26/2022. ______________________________________________________________________ Hospital admission: 11/29/2022 Hospital discharge: 12/03/2022 TCM f/u phone call: performed on 12/04/2022  Recommendations for Outpatient Follow-up:  PCP in 1 week, follow-up chest x-ray in 6 weeks  Discharge Diagnoses:  Principal Problem: Multifocal pneumonia Sepsis present on admission Acute on chronic diastolic CHF Left arm thrombophlebitis   MDD (major depressive disorder), recurrent episode (HCC)   (HFpEF) heart failure with preserved ejection fraction (HCC)   Sjogren's syndrome (HCC)   Advanced care planning/counseling discussion   Atrial fibrillation, chronic (HCC)   Community acquired pneumonia   CHF (congestive heart failure) (HCC)     Relevant past medical, surgical, family and social history reviewed and updated as indicated. Interim medical history since our last visit reviewed. Allergies and medications reviewed and updated. Outpatient Medications Prior to Visit  Medication Sig Dispense Refill   ALPRAZolam (XANAX) 0.5 MG tablet Take 1 tablet (0.5 mg total) by mouth 3 (three) times daily as needed for anxiety. 270 tablet 2   antiseptic oral rinse (BIOTENE) LIQD 15 mLs by Mouth Rinse route 2 (two) times daily as needed for dry mouth.     cholecalciferol (VITAMIN D) 1000 UNITS tablet Take 1,000 Units by mouth daily.     cyanocobalamin (VITAMIN B12) 1000 MCG tablet Take 1 tablet (1,000 mcg total) by mouth once a week.     diltiazem (CARDIZEM CD) 360 MG 24 hr capsule TAKE 1 CAPSULE BY MOUTH DAILY 90 capsule  3   docusate sodium (COLACE) 100 MG capsule Take 100 mg by mouth daily.     dofetilide (TIKOSYN) 250 MCG capsule Take 1 capsule (250 mcg total) by mouth 2 (two) times daily. 180 capsule 2   ELIQUIS 5 MG TABS tablet TAKE 1 TABLET BY MOUTH TWICE  DAILY 200 tablet 2   ezetimibe (ZETIA)  10 MG tablet TAKE 1 TABLET BY MOUTH AT  BEDTIME 100 tablet 0   furosemide (LASIX) 20 MG tablet Take 2 tablets (40 mg total) by mouth daily. 60 tablet 0   gabapentin (NEURONTIN) 300 MG capsule TAKE 2 CAPSULES BY MOUTH IN THE  MORNING 1 CAPSULE BY MOUTH IN  THE AFTERNOON AND 2 CAPSULES BY  MOUTH AT BEDTIME (Patient taking differently: Take 300 mg by mouth 3 (three) times daily. TAKE 2 CAPSULES BY MOUTH IN THE MORNING 1 CAPSULE BY  MOUTH IN THE AFTERNOON AND  2 CAPSULES BY MOUTH AT  BEDTIME) 500 capsule 2   loratadine (CLARITIN) 10 MG tablet Take 1 tablet (10 mg total) by mouth daily. 90 tablet 3   losartan (COZAAR) 100 MG tablet Take 0.5 tablets (50 mg total) by mouth daily.     metoprolol tartrate (LOPRESSOR) 50 MG tablet TAKE 1 AND 1/2 TABLETS BY MOUTH  TWICE DAILY 300 tablet 2   pantoprazole (PROTONIX) 40 MG tablet TAKE 1 TABLET BY MOUTH DAILY 100 tablet 1   Polyethyl Glycol-Propyl Glycol (SYSTANE) 0.4-0.3 % GEL ophthalmic gel Place 1 drop into both eyes 2 (two) times daily.      RESTASIS 0.05 % ophthalmic emulsion INSTILL ONE DROP IN BOTH  EYES TWO TIMES DAILY 180 each 0   thiamine (VITAMIN B1) 100 MG tablet Take 1 tablet (100 mg total) by mouth once a week.     traMADol (ULTRAM) 50 MG tablet TAKE 1 TABLET BY MOUTH DAILY AS  NEEDED 30 tablet 0   potassium chloride SA (KLOR-CON M) 20 MEQ tablet Take 1 tablet (20 mEq total) by mouth daily. 60 tablet 1   temazepam (RESTORIL) 30 MG capsule TAKE 1 CAPSULE(30 MG) BY MOUTH AT BEDTIME AS NEEDED FOR SLEEP (Patient not taking: Reported on 12/18/2022) 90 capsule 2   No facility-administered medications prior to visit.     Per HPI unless specifically indicated in ROS section below Review of Systems  Objective:  BP 128/78 (BP Location: Right Arm, Patient Position: Sitting, Cuff Size: Normal)   Pulse 76   Temp 98.7 F (37.1 C) (Oral)   Ht 5\' 1"  (1.549 m)   Wt 189 lb 9.6 oz (86 kg)   SpO2 98%   BMI 35.82 kg/m   Wt Readings from Last 3 Encounters:   12/18/22 189 lb 9.6 oz (86 kg)  12/03/22 188 lb 14.4 oz (85.7 kg)  11/28/22 196 lb (88.9 kg)      Physical Exam Vitals and nursing note reviewed.  Constitutional:      Appearance: Normal appearance. She is not ill-appearing.  HENT:     Mouth/Throat:     Comments: Wearing mask Eyes:     Extraocular Movements: Extraocular movements intact.     Conjunctiva/sclera: Conjunctivae normal.     Pupils: Pupils are equal, round, and reactive to light.  Cardiovascular:     Rate and Rhythm: Normal rate and regular rhythm.     Pulses: Normal pulses.     Heart sounds: Normal heart sounds. No murmur heard. Pulmonary:     Effort: Pulmonary effort is normal. No respiratory distress.  Breath sounds: Normal breath sounds. No wheezing, rhonchi or rales.  Musculoskeletal:     Right lower leg: No edema.     Left lower leg: No edema.  Skin:    General: Skin is warm and dry.     Findings: No rash.  Neurological:     Mental Status: She is alert.       Lab Results  Component Value Date   NA 137 12/02/2022   CL 108 12/02/2022   K 3.7 12/02/2022   CO2 20 (L) 12/02/2022   BUN 11 12/02/2022   CREATININE 1.26 (H) 12/02/2022   GFRNONAA 46 (L) 12/02/2022   CALCIUM 8.9 12/02/2022   PHOS 2.7 07/25/2022   ALBUMIN 3.8 11/29/2022   GLUCOSE 120 (H) 12/02/2022    Lab Results  Component Value Date   WBC 12.3 (H) 12/02/2022   HGB 11.1 (L) 12/02/2022   HCT 33.9 (L) 12/02/2022   MCV 83.1 12/02/2022   PLT 223 12/02/2022     Assessment & Plan:   Problem List Items Addressed This Visit     (HFpEF) heart failure with preserved ejection fraction (HCC)    Seems euvolemic today on new lasix dose of 40mg  daily.       Sjogren's syndrome (HCC)    Stable period followed by rhuem.       Dysphagia    Notes worsening trouble specifically with Klor-Con tablets. Tolerated tablets better - will change back to this. Update potassium levels at CPE labs in 3 wks.  Notes overdue for  colonoscopy/EGD. Previously saw [facility].       Chronic GERD    Continue daily PPI.       Community acquired pneumonia - Primary    Symptoms improved after antibiotic course.  Rpt CXR when she returns for physical in 3-4 wks.       Relevant Orders   DG Chest 2 View   Thrombophlebitis of left arm    Supportive measures reviewed - start warm compresses  Overall improving.         Meds ordered this encounter  Medications   potassium chloride (KLOR-CON M) 10 MEQ tablet    Sig: Take 1 tablet (10 mEq total) by mouth 2 (two) times daily.    Dispense:  180 tablet    Refill:  1    Orders Placed This Encounter  Procedures   DG Chest 2 View    Standing Status:   Future    Standing Expiration Date:   12/18/2023    Order Specific Question:   Reason for Exam (SYMPTOM  OR DIAGNOSIS REQUIRED)    Answer:   follow up pneumonia    Order Specific Question:   Preferred imaging location?    Answer:   [facility]    Patient Instructions  Stop potassium tablets, restart potassium tablets, take 1 twice daily.  Continue current medicines Keep physical scheduled for next month as well as lab visit.  We will recheck xray either at your physical or at your lab visit prior. Ok to get COVID and RSV shots but I would suggest separating by a few weeks.   Follow up plan: No follow-ups on file.  Eustaquio Boyden, MD

## 2022-12-18 NOTE — Assessment & Plan Note (Signed)
Symptoms improved after antibiotic course.  Rpt CXR when she returns for physical in 3-4 wks.

## 2022-12-18 NOTE — Patient Instructions (Addendum)
Stop potassium tablets, restart potassium tablets, take 1 twice daily.  Continue current medicines Keep physical scheduled for next month as well as lab visit.  We will recheck xray either at your physical or at your lab visit prior. Ok to get COVID and RSV shots but I would suggest separating by a few weeks.

## 2022-12-18 NOTE — Assessment & Plan Note (Signed)
Stable period followed by rhuem.

## 2022-12-18 NOTE — Assessment & Plan Note (Signed)
Continue daily PPI.  

## 2022-12-18 NOTE — Assessment & Plan Note (Addendum)
Notes worsening trouble specifically with Klor-Con tablets. Tolerated tablets better - will change back to this. Update potassium levels at CPE labs in 3 wks.  Notes overdue for colonoscopy/EGD. Previously saw [facility].

## 2022-12-18 NOTE — Assessment & Plan Note (Signed)
Supportive measures reviewed - start warm compresses  Overall improving.

## 2022-12-19 ENCOUNTER — Other Ambulatory Visit: Payer: Self-pay | Admitting: Family Medicine

## 2022-12-19 DIAGNOSIS — I10 Essential (primary) hypertension: Secondary | ICD-10-CM

## 2022-12-20 NOTE — Telephone Encounter (Signed)
Last given by ED. Ok to refill as pended

## 2022-12-24 NOTE — Telephone Encounter (Signed)
ERx 1/2 tab dose

## 2022-12-25 NOTE — Progress Notes (Unsigned)
Cardiology Clinic Note   Patient Name: Alexandria Leach Date of Encounter: 12/25/2022  Primary Care Provider:  Eustaquio Boyden, MD Primary Cardiologist:  Olga Millers, MD  Patient Profile    72 year old female with history of chronic diastolic CHF, remote PE, chronic A-fib, Sjogren's disease, depression presented to the ED with cough congestion shortness of breath and pleuritic right-sided chest pain. Admitted from 11/29/2022-12/03/2022 with acute on chronic diastolic CHF, multifocal pneumonia, with COVID, left arm thrombophlebitis, and chronic atrial fibrillation/flutter.  Past Medical History    Past Medical History:  Diagnosis Date   Ankle fracture, left 02/19/2015   from a fall   Anxiety    Cataract 2014   corrected with surgery   CHF (congestive heart failure) (HCC)    CKD (chronic kidney disease) stage 3, GFR 30-59 ml/min [facility])    saw nephrologist Dr Stephens Shire   COVID-19 09/21/2021   DDD (degenerative disc disease), cervical    Depression    Fibromyalgia    Glaucoma    s/p surgery, sees ophtho Q6 mo   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 Thought benign.    Osteoarthritis    shoulders and knees, not RA per Dr Corliss Skains, positive ANA, positive Ro   Osteopenia 09/18/2015   DEXA T -1.1 hip, -0.2 spine 08/2015    Personal history of urinary calculi latest 2014   Pneumonia 12/02/2011   PONV (postoperative nausea and vomiting)    Refusal of blood transfusions as patient is Jehovah's Witness    Rheumatic heart disease 1980   s/p mitral valve repair 1980   Sjogren's syndrome (HCC)    Trimalleolar fracture of left  ankle 02/23/2015   Past Surgical History:  Procedure Laterality Date   BREAST BIOPSY Right 2006   benign   CARDIOVERSION N/A 07/18/2020   Procedure: CARDIOVERSION;  Surgeon: Parke Poisson, MD;  Location: [facility] ENDOSCOPY;  Service: Cardiovascular;  Laterality: N/A;   CHOLECYSTECTOMY  11/27/2011   Procedure: LAPAROSCOPIC CHOLECYSTECTOMY WITH INTRAOPERATIVE CHOLANGIOGRAM;  Surgeon: Ernestene Mention, MD;  Location: [facility] OR;  Service: General;  Laterality: N/A;  laparoscopic cholecystectomy with choleangiogram umbilical hernia repair   COLONOSCOPY  07/2014   WNL Madilyn Fireman)   COLONOSCOPY  08/2019   3 TAs, rpt 3 yrs ([place])   dexa  08/2012   normal per patient - no records available   ESOPHAGOGASTRODUODENOSCOPY  08/2019   reactive gastropathy, neg H pylori ([place])   EYE SURGERY Bilateral 2014   cataract removal   MITRAL VALVE REPAIR  1980   open heart   ORIF ANKLE FRACTURE Left 02/26/2015   Procedure: OPEN REDUCTION INTERNAL FIXATION (ORIF) LEFT TRIMALLEOLAR ANKLE FRACTURE;  Surgeon: Tarry Kos, MD;  Location: [facility] OR;  Service: Orthopedics;  Laterality: Left;   TUBAL LIGATION  1980   UMBILICAL HERNIA REPAIR  11/27/2011   Procedure: HERNIA REPAIR UMBILICAL ADULT;  Surgeon: Ernestene Mention, MD;  Location: [facility] OR;  Service: General;  Laterality: N/A;   VAGINAL HYSTERECTOMY  1992   for fibroids -- partial, ovaries remain    Allergies  Allergies  Allergen  Reactions   Cymbalta [Duloxetine Hcl] Other (See Comments)    tachycardia   Statins Nausea Only and Other (See Comments)    Muscle cramps also   Sulfa Antibiotics Nausea And Vomiting    History of Present Illness    Mrs. Hazell comes today post hospital follow up after admission for multifocal pneumonia with COVID. A/C diastolic CHF and atrial fib.  Had left arm phlebitis.    Home Medications    Current Outpatient Medications  Medication Sig Dispense Refill   ALPRAZolam (XANAX) 0.5 MG tablet Take 1 tablet (0.5 mg total) by  mouth 3 (three) times daily as needed for anxiety. 270 tablet 2   antiseptic oral rinse (BIOTENE) LIQD 15 mLs by Mouth Rinse route 2 (two) times daily as needed for dry mouth.     cholecalciferol (VITAMIN D) 1000 UNITS tablet Take 1,000 Units by mouth daily.     cyanocobalamin (VITAMIN B12) 1000 MCG tablet Take 1 tablet (1,000 mcg total) by mouth once a week.     diltiazem (CARDIZEM CD) 360 MG 24 hr capsule TAKE 1 CAPSULE BY MOUTH DAILY 90 capsule 3   docusate sodium (COLACE) 100 MG capsule Take 100 mg by mouth daily.     dofetilide (TIKOSYN) 250 MCG capsule Take 1 capsule (250 mcg total) by mouth 2 (two) times daily. 180 capsule 2   ELIQUIS 5 MG TABS tablet TAKE 1 TABLET BY MOUTH TWICE  DAILY 200 tablet 2   ezetimibe (ZETIA) 10 MG tablet TAKE 1 TABLET BY MOUTH AT  BEDTIME 100 tablet 0   furosemide (LASIX) 20 MG tablet Take 2 tablets (40 mg total) by mouth daily. 60 tablet 0   gabapentin (NEURONTIN) 300 MG capsule TAKE 2 CAPSULES BY MOUTH IN THE  MORNING 1 CAPSULE BY MOUTH IN  THE AFTERNOON AND 2 CAPSULES BY  MOUTH AT BEDTIME (Patient taking differently: Take 300 mg by mouth 3 (three) times daily. TAKE 2 CAPSULES BY MOUTH IN THE MORNING 1 CAPSULE BY  MOUTH IN THE AFTERNOON AND  2 CAPSULES BY MOUTH AT  BEDTIME) 500 capsule 2   loratadine (CLARITIN) 10 MG tablet Take 1 tablet (10 mg total) by mouth daily. 90 tablet 3   losartan (COZAAR) 100 MG tablet Take 0.5 tablets (50 mg total) by mouth daily. 50 tablet 2   metoprolol tartrate (LOPRESSOR) 50 MG tablet TAKE 1 AND 1/2 TABLETS BY MOUTH  TWICE DAILY 300 tablet 2   pantoprazole (PROTONIX) 40 MG tablet TAKE 1 TABLET BY MOUTH DAILY 100 tablet 1   Polyethyl Glycol-Propyl Glycol (SYSTANE) 0.4-0.3 % GEL ophthalmic gel Place 1 drop into both eyes 2 (two) times daily.      potassium chloride (KLOR-CON M) 10 MEQ tablet Take 1 tablet (10 mEq total) by mouth 2 (two) times daily. 180 tablet 1   RESTASIS 0.05 % ophthalmic emulsion INSTILL ONE DROP IN BOTH  EYES TWO  TIMES DAILY 180 each 0   temazepam (RESTORIL) 30 MG capsule TAKE 1 CAPSULE(30 MG) BY MOUTH AT BEDTIME AS NEEDED FOR SLEEP (Patient not taking: Reported on 12/18/2022) 90 capsule 2   thiamine (VITAMIN B1) 100 MG tablet Take 1 tablet (100 mg total) by mouth once a week.     traMADol (ULTRAM) 50 MG tablet TAKE 1 TABLET BY MOUTH DAILY AS  NEEDED 30 tablet 0   No current facility-administered medications for this visit.     Family History    Family History  Problem Relation Age of Onset   Cancer  Mother        lung (nonsmoker)   CAD Mother        MI in her 70s   ALS Mother    Kidney disease Father    Alcohol abuse Father    Diabetes Father    Lupus Sister        and niece   Diabetes Sister    Stroke Sister    Depression Sister    Lupus Sister    Blindness Sister    Cancer Maternal Uncle        bone   Stroke Maternal Grandmother    Cancer Brother        bone   Diabetes Brother    Heart attack Brother    Healthy Son    Healthy Son    Healthy Son    Healthy Son    Kidney failure Other        on HD   Breast cancer Neg Hx    She indicated that her mother is deceased. She indicated that her father is deceased. She indicated that seven of her nine sisters are alive. She indicated that two of her seven brothers are alive. She indicated that the status of her maternal grandmother is unknown. She indicated that all of her four sons are alive. She indicated that the status of her maternal uncle is unknown. She indicated that the status of her neg hx is unknown. She indicated that the status of her other is unknown.  Social History    Social History   Socioeconomic History   Marital status: Married    Spouse name: [name]   Number of children: 4   Years of education: 12   Highest education level: 12th grade  Occupational History    Employer: UNEMPLOYED    Comment: Disability  Tobacco Use   Smoking status: Never    Passive exposure: Past   Smokeless tobacco: Never   Tobacco  comments:    Never smoke 08/29/21  Vaping Use   Vaping status: Never Used  Substance and Sexual Activity   Alcohol use: No    Alcohol/week: 0.0 standard drinks of alcohol   Drug use: No   Sexual activity: Not Currently    Birth control/protection: Surgical  Other Topics Concern   Not on file  Social History Narrative   Lives with son, 1 dog   Occupation: unemployed, on disability for fibromyalgia since 2008.   Edu: HS   Religion: Jehova's witness   Activity: volunteers at senior center   Diet: some water, fruits/vegetables daily   No caffeine use   Social Determinants of Health   Financial Resource Strain: Low Risk  (07/11/2022)   Overall Financial Resource Strain (CARDIA)    Difficulty of Paying Living Expenses: Not hard at all  Food Insecurity: No Food Insecurity (12/09/2022)   Hunger Vital Sign    Worried About Running Out of Food in the Last Year: Never true    Ran Out of Food in the Last Year: Never true  Transportation Needs: No Transportation Needs (12/09/2022)   PRAPARE - Administrator, Civil Service (Medical): No    Lack of Transportation (Non-Medical): No  Physical Activity: Insufficiently Active (07/11/2022)   Exercise Vital Sign    Days of Exercise per Week: 3 days    Minutes of Exercise per Session: 10 min  Stress: No Stress Concern Present (07/11/2022)   [employer] of Occupational Health - Occupational Stress Questionnaire    Feeling  of Stress : Only a little  Social Connections: Socially Integrated (07/11/2022)   Social Connection and Isolation Panel [NHANES]    Frequency of Communication with Friends and Family: Twice a week    Frequency of Social Gatherings with Friends and Family: Twice a week    Attends Religious Services: More than 4 times per year    Active Member of [employer] or Organizations: Yes    Attends Engineer, structural: More than 4 times per year    Marital Status: Married  Catering manager Violence: Not At Risk  (02/12/2021)   Humiliation, Afraid, Rape, and Kick questionnaire    Fear of Current or Ex-Partner: No    Emotionally Abused: No    Physically Abused: No    Sexually Abused: No     Review of Systems    General:  No chills, fever, night sweats or weight changes.  Cardiovascular:  No chest pain, dyspnea on exertion, edema, orthopnea, palpitations, paroxysmal nocturnal dyspnea. Dermatological: No rash, lesions/masses Respiratory: No cough, dyspnea Urologic: No hematuria, dysuria Abdominal:   No nausea, vomiting, diarrhea, bright red blood per rectum, melena, or hematemesis Neurologic:  No visual changes, wkns, changes in mental status. All other systems reviewed and are otherwise negative except as noted above.       Physical Exam    VS:  There were no vitals taken for this visit. , BMI There is no height or weight on file to calculate BMI.     GEN: Well nourished, well developed, in no acute distress. HEENT: normal. Neck: Supple, no JVD, carotid bruits, or masses. Cardiac: RRR, no murmurs, rubs, or gallops. No clubbing, cyanosis, edema.  Radials/DP/PT 2+ and equal bilaterally.  Respiratory:  Respirations regular and unlabored, clear to auscultation bilaterally. GI: Soft, nontender, nondistended, BS + x 4. MS: no deformity or atrophy. Skin: warm and dry, no rash. Neuro:  Strength and sensation are intact. Psych: Normal affect.      Lab Results  Component Value Date   WBC 12.3 (H) 12/02/2022   HGB 11.1 (L) 12/02/2022   HCT 33.9 (L) 12/02/2022   MCV 83.1 12/02/2022   PLT 223 12/02/2022   Lab Results  Component Value Date   CREATININE 1.26 (H) 12/02/2022   BUN 11 12/02/2022   NA 137 12/02/2022   K 3.7 12/02/2022   CL 108 12/02/2022   CO2 20 (L) 12/02/2022   Lab Results  Component Value Date   ALT 19 11/29/2022   AST 38 11/29/2022   ALKPHOS 64 11/29/2022   BILITOT 1.3 (H) 11/29/2022   Lab Results  Component Value Date   CHOL 201 (H) 12/25/2021   HDL 76  12/25/2021   LDLCALC 112 (H) 12/25/2021   LDLDIRECT 77.0 12/09/2016   TRIG 72 12/25/2021   CHOLHDL 2.6 12/25/2021    Lab Results  Component Value Date   HGBA1C 6.0 01/04/2022     Review of Prior Studies Echocardiogram 01/07/2022     1. Left ventricular ejection fraction, by estimation, is 60 to 65%. The  left ventricle has normal function. The left ventricle has no regional  wall motion abnormalities. Left ventricular diastolic parameters are  indeterminate.   2. Right ventricular systolic function is normal. The right ventricular  size is normal. There is normal pulmonary artery systolic pressure.   3. Left atrial size was moderately dilated.   4. The mitral valve has been repaired/replaced. Mild mitral valve  regurgitation. Mild to moderate mitral stenosis. The mean mitral valve  gradient  is 7.5 mmHg. There is a prosthetic annuloplasty ring present in  the mitral position. Procedure Date: 1980.   5. The aortic valve is normal in structure. There is mild calcification  of the aortic valve. There is mild thickening of the aortic valve. Aortic  valve regurgitation is moderate. Mild aortic valve stenosis. Aortic valve  mean gradient measures 12.0 mmHg.   Aortic valve Vmax measures 2.30 m/s.   6. The inferior vena cava is normal in size with greater than 50%  respiratory variability, suggesting right atrial pressure of 3 mmHg.   Assessment & Plan   1.  ***     {Are you ordering a CV Procedure (e.g. stress test, cath, DCCV, TEE, etc)?   Press F2        :[id number]}   Signed, Bettey Mare. Liborio Nixon, ANP, AACC   12/25/2022 10:16 AM      Office [phone] Fax [phone]  Notice: This dictation was prepared with Dragon dictation along with smaller phrase technology. Any transcriptional errors that result from this process are unintentional and may not be corrected upon review.

## 2022-12-26 ENCOUNTER — Encounter: Payer: Self-pay | Admitting: Adult Health

## 2022-12-26 ENCOUNTER — Ambulatory Visit: Payer: 59 | Attending: Adult Health | Admitting: Adult Health

## 2022-12-26 ENCOUNTER — Other Ambulatory Visit: Payer: Self-pay | Admitting: Cardiology

## 2022-12-26 VITALS — BP 122/60 | HR 55 | Ht 61.0 in | Wt 194.0 lb

## 2022-12-26 DIAGNOSIS — I10 Essential (primary) hypertension: Secondary | ICD-10-CM | POA: Diagnosis not present

## 2022-12-26 DIAGNOSIS — I480 Paroxysmal atrial fibrillation: Secondary | ICD-10-CM

## 2022-12-26 DIAGNOSIS — I5032 Chronic diastolic (congestive) heart failure: Secondary | ICD-10-CM

## 2022-12-26 NOTE — Patient Instructions (Signed)
Medication Instructions:  No Changes *If you need a refill on your cardiac medications before your next appointment, please call your pharmacy*   Lab Work: No Labs If you have labs (blood work) drawn today and your tests are completely normal, you will receive your results only by: MyChart Message (if you have MyChart) OR A paper copy in the mail If you have any lab test that is abnormal or we need to change your treatment, we will call you to review the results.   Testing/Procedures: No Testing   Follow-Up: At [facility], you and your health needs are our priority.  As part of our continuing mission to provide you with exceptional heart care, we have created designated Provider Care Teams.  These Care Teams include your primary Cardiologist (physician) and Advanced Practice Providers (APPs -  Physician Assistants and Nurse Practitioners) who all work together to provide you with the care you need, when you need it.  We recommend signing up for the patient portal called "MyChart".  Sign up information is provided on this After Visit Summary.  MyChart is used to connect with patients for Virtual Visits (Telemedicine).  Patients are able to view lab/test results, encounter notes, upcoming appointments, etc.  Non-urgent messages can be sent to your provider as well.   To learn more about what you can do with MyChart, go to [email or web address].    Your next appointment:   6 month(s)  Provider:   Olga Millers, MD   Lewayne Bunting , MD First Available

## 2022-12-30 ENCOUNTER — Other Ambulatory Visit: Payer: Self-pay | Admitting: Family Medicine

## 2023-01-01 ENCOUNTER — Ambulatory Visit: Payer: Self-pay

## 2023-01-01 NOTE — Patient Outreach (Signed)
Care Coordination   Follow Up Visit Note   01/01/2023 Name: Alexandria Leach MRN: [id number] DOB: 1951-01-03  Alexandria Leach is a 71 y.o. year old female who sees Eustaquio Boyden, MD for primary care. I spoke with  Alexandria Leach by phone today.  What matters to the patients health and wellness today?  Patient reports having recent follow up visit with primary care provider and cardiologist.  Patient states her cardiologist request her to follow up with Dr. Ladona Ridgel due to ongoing [facility] treatment.   Patient states her fatigue level is better.   Denies having any further pneumonia like symptoms. Patient states she attempted to increase physical activity by walking outside however noticed her feet swelling.  Patient states she will wait to do outside walking until after seeing cardiologist, Dr. Ladona Ridgel. Patient denies any increase in shortness of breath.  Reports current weight is 193 lbs.    Goals Addressed             This Visit's Progress    Ongoing improvement post hospitalization for pneumonia and management/ education of health conditions       Interventions Today    Flowsheet Row Most Recent Value  Chronic Disease   Chronic disease during today's visit Congestive Heart Failure (CHF), Other  [Pneumonia]  General Interventions   General Interventions Discussed/Reviewed General Interventions Reviewed, Doctor Visits  [evaluation of current treatment plan for HF/ pneumonia and patients adherence to plan as established by provider. Assessed for ongoing pneumonia symptoms and/ or HF symptoms.]  Doctor Visits Discussed/Reviewed Doctor Visits Reviewed  [Discussed recent cardiology and primary care provider visits. Advised to keep follow up visits with provider. Reviewed upcoming provider visits.]  Exercise Interventions   Exercise Discussed/Reviewed Physical Activity  [Assessed patients current activity level]  Physical Activity Discussed/Reviewed Physical Activity  Reviewed  [Advised to do more walking inside/ getting up more frequently if unable to walk outside. Advised to elevate legs when sitting/ laying down to help decrease leg swelling.]  Education Interventions   Education Provided Provided Education  [Reviewed HF symptoms and action plan.  Advised patient to notify provider if experiencing ongoing pneumonia symptoms. Advised to notify provider for moderate HF symptoms and call 911 for severe symptoms.]  Nutrition Interventions   Nutrition Discussed/Reviewed Nutrition Reviewed, Decreasing salt, Decreasing sugar intake  [encouraged to follow heart healthy diet.]  Pharmacy Interventions   Pharmacy Dicussed/Reviewed Pharmacy Topics Reviewed  Algis Downs to take medications as prescribed.]              SDOH assessments and interventions completed:  No     Care Coordination Interventions:  Yes, provided   Follow up plan: Follow up call scheduled for 02/06/23 at 2:30 pm    Encounter Outcome:  Patient Visit Completed   [name] RN,BSN,CCM [facility] Care Coordination [phone] direct line

## 2023-01-01 NOTE — Patient Instructions (Signed)
Visit Information  Thank you for taking time to visit with me today. Please don't hesitate to contact me if I can be of assistance to you.   Following are the goals we discussed today:   Goals Addressed             This Visit's Progress    Ongoing improvement post hospitalization for pneumonia and management/ education of health conditions       Interventions Today    Flowsheet Row Most Recent Value  Chronic Disease   Chronic disease during today's visit Congestive Heart Failure (CHF), Other  [Pneumonia]  General Interventions   General Interventions Discussed/Reviewed General Interventions Reviewed, Doctor Visits  [evaluation of current treatment plan for HF/ pneumonia and patients adherence to plan as established by provider. Assessed for ongoing pneumonia symptoms and/ or HF symptoms.]  Doctor Visits Discussed/Reviewed Doctor Visits Reviewed  [Discussed recent cardiology and primary care provider visits. Advised to keep follow up visits with provider. Reviewed upcoming provider visits.]  Exercise Interventions   Exercise Discussed/Reviewed Physical Activity  [Assessed patients current activity level]  Physical Activity Discussed/Reviewed Physical Activity Reviewed  [Advised to do more walking inside/ getting up more frequently if unable to walk outside. Advised to elevate legs when sitting/ laying down to help decrease leg swelling.]  Education Interventions   Education Provided Provided Education  [Reviewed HF symptoms and action plan.  Advised patient to notify provider if experiencing ongoing pneumonia symptoms. Advised to notify provider for moderate HF symptoms and call 911 for severe symptoms.]  Nutrition Interventions   Nutrition Discussed/Reviewed Nutrition Reviewed, Decreasing salt, Decreasing sugar intake  [encouraged to follow heart healthy diet.]  Pharmacy Interventions   Pharmacy Dicussed/Reviewed Pharmacy Topics Reviewed  Algis Downs to take medications as prescribed.]               Our next appointment is by telephone on 02/06/23 at 2:30 pm  Please call the care guide team at [phone] if you need to cancel or reschedule your appointment.   If you are experiencing a Mental Health or Behavioral Health Crisis or need someone to talk to, please call the Suicide and Crisis Lifeline: 988 call 1-800-273-TALK (toll free, 24 hour hotline)  Patient verbalizes understanding of instructions and care plan provided today and agrees to view in MyChart. Active MyChart status and patient understanding of how to access instructions and care plan via MyChart confirmed with patient.     [name] RN,BSN,CCM [facility] Care Coordination [phone] direct line

## 2023-01-02 ENCOUNTER — Other Ambulatory Visit: Payer: Self-pay | Admitting: Family Medicine

## 2023-01-02 DIAGNOSIS — E041 Nontoxic single thyroid nodule: Secondary | ICD-10-CM

## 2023-01-02 DIAGNOSIS — R7303 Prediabetes: Secondary | ICD-10-CM

## 2023-01-02 DIAGNOSIS — E7800 Pure hypercholesterolemia, unspecified: Secondary | ICD-10-CM

## 2023-01-02 DIAGNOSIS — N2581 Secondary hyperparathyroidism of renal origin: Secondary | ICD-10-CM

## 2023-01-02 DIAGNOSIS — E519 Thiamine deficiency, unspecified: Secondary | ICD-10-CM

## 2023-01-02 DIAGNOSIS — E538 Deficiency of other specified B group vitamins: Secondary | ICD-10-CM

## 2023-01-02 DIAGNOSIS — N1832 Chronic kidney disease, stage 3b: Secondary | ICD-10-CM

## 2023-01-02 DIAGNOSIS — M3501 Sicca syndrome with keratoconjunctivitis: Secondary | ICD-10-CM

## 2023-01-07 ENCOUNTER — Other Ambulatory Visit (INDEPENDENT_AMBULATORY_CARE_PROVIDER_SITE_OTHER): Payer: 59

## 2023-01-07 DIAGNOSIS — N1832 Chronic kidney disease, stage 3b: Secondary | ICD-10-CM | POA: Diagnosis not present

## 2023-01-07 DIAGNOSIS — N2581 Secondary hyperparathyroidism of renal origin: Secondary | ICD-10-CM

## 2023-01-07 DIAGNOSIS — E7800 Pure hypercholesterolemia, unspecified: Secondary | ICD-10-CM | POA: Diagnosis not present

## 2023-01-07 DIAGNOSIS — M3501 Sicca syndrome with keratoconjunctivitis: Secondary | ICD-10-CM

## 2023-01-07 DIAGNOSIS — E538 Deficiency of other specified B group vitamins: Secondary | ICD-10-CM | POA: Diagnosis not present

## 2023-01-07 DIAGNOSIS — R7303 Prediabetes: Secondary | ICD-10-CM

## 2023-01-07 DIAGNOSIS — E519 Thiamine deficiency, unspecified: Secondary | ICD-10-CM

## 2023-01-07 DIAGNOSIS — E041 Nontoxic single thyroid nodule: Secondary | ICD-10-CM

## 2023-01-07 LAB — TSH: TSH: 4 u[IU]/mL (ref 0.35–5.50)

## 2023-01-07 LAB — COMPREHENSIVE METABOLIC PANEL
ALT: 9 U/L (ref 0–35)
AST: 18 U/L (ref 0–37)
Albumin: 4 g/dL (ref 3.5–5.2)
Alkaline Phosphatase: 56 U/L (ref 39–117)
BUN: 14 mg/dL (ref 6–23)
CO2: 24 meq/L (ref 19–32)
Calcium: 9.5 mg/dL (ref 8.4–10.5)
Chloride: 108 meq/L (ref 96–112)
Creatinine, Ser: 1.26 mg/dL — ABNORMAL HIGH (ref 0.40–1.20)
GFR: 43.27 mL/min — ABNORMAL LOW (ref >60.00)
Glucose, Bld: 100 mg/dL — ABNORMAL HIGH (ref 70–99)
Potassium: 4.3 meq/L (ref 3.5–5.1)
Sodium: 141 meq/L (ref 135–145)
Total Bilirubin: 0.4 mg/dL (ref 0.2–1.2)
Total Protein: 7.1 g/dL (ref 6.0–8.3)

## 2023-01-07 LAB — CBC WITH DIFFERENTIAL/PLATELET
Basophils Absolute: 0 10*3/uL (ref 0.0–0.1)
Basophils Relative: 0.6 % (ref 0.0–3.0)
Eosinophils Absolute: 0.1 10*3/uL (ref 0.0–0.7)
Eosinophils Relative: 2.2 % (ref 0.0–5.0)
HCT: 37.9 % (ref 36.0–46.0)
Hemoglobin: 11.9 g/dL — ABNORMAL LOW (ref 12.0–15.0)
Lymphocytes Relative: 33.6 % (ref 12.0–46.0)
Lymphs Abs: 1.7 10*3/uL (ref 0.7–4.0)
MCHC: 31.5 g/dL (ref 30.0–36.0)
MCV: 87.1 fL (ref 78.0–100.0)
Monocytes Absolute: 0.5 10*3/uL (ref 0.1–1.0)
Monocytes Relative: 10.1 % (ref 3.0–12.0)
Neutro Abs: 2.6 10*3/uL (ref 1.4–7.7)
Neutrophils Relative %: 53.5 % (ref 43.0–77.0)
Platelets: 266 10*3/uL (ref 150.0–400.0)
RBC: 4.35 Mil/uL (ref 3.87–5.11)
RDW: 14.7 % (ref 11.5–15.5)
WBC: 4.9 10*3/uL (ref 4.0–10.5)

## 2023-01-07 LAB — LIPID PANEL
Cholesterol: 216 mg/dL — ABNORMAL HIGH (ref 0–200)
HDL: 61.2 mg/dL (ref >39.00)
LDL Cholesterol: 137 mg/dL — ABNORMAL HIGH (ref 0–99)
NonHDL: 155.28
Total CHOL/HDL Ratio: 4
Triglycerides: 91 mg/dL (ref 0.0–149.0)
VLDL: 18.2 mg/dL (ref 0.0–40.0)

## 2023-01-07 LAB — VITAMIN D 25 HYDROXY (VIT D DEFICIENCY, FRACTURES): VITD: 35.32 ng/mL (ref 30.00–100.00)

## 2023-01-07 LAB — MICROALBUMIN / CREATININE URINE RATIO
Creatinine,U: 185.2 mg/dL
Microalb Creat Ratio: 0.4 mg/g (ref 0.0–30.0)
Microalb, Ur: <0.7 mg/dL (ref 0.0–1.9)

## 2023-01-07 LAB — PHOSPHORUS: Phosphorus: 2.5 mg/dL (ref 2.3–4.6)

## 2023-01-07 LAB — HEMOGLOBIN A1C: Hgb A1c MFr Bld: 6.1 % (ref 4.6–6.5)

## 2023-01-07 LAB — VITAMIN B12: Vitamin B-12: 520 pg/mL (ref 211–911)

## 2023-01-11 LAB — PARATHYROID HORMONE, INTACT (NO CA): PTH: 108 pg/mL — ABNORMAL HIGH (ref 16–77)

## 2023-01-11 LAB — VITAMIN B1: Vitamin B1 (Thiamine): 27 nmol/L (ref 8–30)

## 2023-01-12 ENCOUNTER — Other Ambulatory Visit: Payer: Self-pay | Admitting: Family Medicine

## 2023-01-13 NOTE — Progress Notes (Unsigned)
Electrophysiology Office Note:   Date:  01/14/2023  ID:  Evanny, Faso [birth year], MRN [id number]  Primary Cardiologist: Olga Millers, MD Electrophysiologist: Lewayne Bunting, MD      History of Present Illness:   Alexandria Leach is a 72 y.o. female with h/o atrial flutter, s/p MV repair in setting of rheumatic heart disease, CHF, and CKF seen today for routine electrophysiology followup.   Since last being seen in our clinic the patient reports doing well. Admitted for PNA in September and maintained NSR. Still having a little bit of SOB with activity, but improving. Walking for about 30 minutes daily. She denies chest pain, palpitations, PND, orthopnea, nausea, vomiting, dizziness, syncope, edema, weight gain, or early satiety.   Review of systems complete and found to be negative unless listed in HPI.   EP Information / Studies Reviewed:    EKG is ordered today. Personal review as below.  EKG Interpretation Date/Time:  Tuesday January 14 2023 07:57:41 EDT Ventricular Rate:  70 PR Interval:  202 QRS Duration:  84 QT Interval:  406 QTC Calculation: 438 R Axis:   70  Text Interpretation: Normal sinus rhythm Nonspecific T wave abnormality Stable QT on tikosyn Confirmed by Maxine Glenn [phone]) on 01/14/2023 8:04:53 AM    Echo 01/08/22 LVEF 60-65%, Mod LAE, mild MR, Mild/Mod MS, Mod AR  Physical Exam:   VS:  BP 122/68   Pulse 70   Ht 5\' 1"  (1.549 m)   Wt 195 lb (88.5 kg)   SpO2 98%   BMI 36.84 kg/m    Wt Readings from Last 3 Encounters:  01/14/23 195 lb (88.5 kg)  12/26/22 194 lb (88 kg)  12/18/22 189 lb 9.6 oz (86 kg)     GEN: Well nourished, well developed in no acute distress NECK: No JVD; No carotid bruits CARDIAC: Regular rate and rhythm, no murmurs, rubs, gallops RESPIRATORY:  Clear to auscultation without rales, wheezing or rhonchi  ABDOMEN: Soft, non-tender, non-distended EXTREMITIES:  No edema; No deformity   ASSESSMENT AND PLAN:     Atypical atrial flutter EKG today shows NSR with stable QT Continue tikosyn 250 mcg BId Labs today.  Continue eliquis 5 mg BID Continue diltiazem 360 mg daily Continue lopressor 75 mg BID  Secondary hypercoagulable state Pt on Eliquis as above   HFpEF Euvolemic today  HTN Stable on current regimen    Follow up with Dr. Ladona Ridgel in 6 months  Signed, Graciella Freer, PA-C

## 2023-01-14 ENCOUNTER — Ambulatory Visit: Payer: 59 | Attending: Student | Admitting: Student

## 2023-01-14 ENCOUNTER — Encounter: Payer: 59 | Admitting: Family Medicine

## 2023-01-14 ENCOUNTER — Encounter: Payer: Self-pay | Admitting: Student

## 2023-01-14 VITALS — BP 122/68 | HR 70 | Ht 61.0 in | Wt 195.0 lb

## 2023-01-14 DIAGNOSIS — I10 Essential (primary) hypertension: Secondary | ICD-10-CM | POA: Diagnosis not present

## 2023-01-14 DIAGNOSIS — I480 Paroxysmal atrial fibrillation: Secondary | ICD-10-CM | POA: Diagnosis not present

## 2023-01-14 DIAGNOSIS — I5032 Chronic diastolic (congestive) heart failure: Secondary | ICD-10-CM | POA: Diagnosis not present

## 2023-01-14 DIAGNOSIS — D6869 Other thrombophilia: Secondary | ICD-10-CM | POA: Diagnosis not present

## 2023-01-14 NOTE — Patient Instructions (Signed)
Medication Instructions:  Your physician recommends that you continue on your current medications as directed. Please refer to the Current Medication list given to you today.  *If you need a refill on your cardiac medications before your next appointment, please call your pharmacy*  Lab Work: BMET, MAG--TODAY If you have labs (blood work) drawn today and your tests are completely normal, you will receive your results only by: MyChart Message (if you have MyChart) OR A paper copy in the mail If you have any lab test that is abnormal or we need to change your treatment, we will call you to review the results.  Follow-Up: At [facility], you and your health needs are our priority.  As part of our continuing mission to provide you with exceptional heart care, we have created designated Provider Care Teams.  These Care Teams include your primary Cardiologist (physician) and Advanced Practice Providers (APPs -  Physician Assistants and Nurse Practitioners) who all work together to provide you with the care you need, when you need it.  Your next appointment:   6 month(s)  Provider:   Gregg Taylor, MD  

## 2023-01-15 LAB — BASIC METABOLIC PANEL
BUN/Creatinine Ratio: 9 — ABNORMAL LOW (ref 12–28)
BUN: 13 mg/dL (ref 8–27)
CO2: 23 mmol/L (ref 20–29)
Calcium: 10.2 mg/dL (ref 8.7–10.3)
Chloride: 105 mmol/L (ref 96–106)
Creatinine, Ser: 1.45 mg/dL — ABNORMAL HIGH (ref 0.57–1.00)
Glucose: 103 mg/dL — ABNORMAL HIGH (ref 70–99)
Potassium: 3.7 mmol/L (ref 3.5–5.2)
Sodium: 142 mmol/L (ref 134–144)
eGFR: 39 mL/min/{1.73_m2} — ABNORMAL LOW (ref >59)

## 2023-01-15 LAB — MAGNESIUM: Magnesium: 1.8 mg/dL (ref 1.6–2.3)

## 2023-01-16 ENCOUNTER — Telehealth: Payer: Self-pay | Admitting: Student

## 2023-01-16 ENCOUNTER — Encounter: Payer: Self-pay | Admitting: Internal Medicine

## 2023-01-16 NOTE — Telephone Encounter (Signed)
Return call to patient unable to leave voicemail

## 2023-01-16 NOTE — Telephone Encounter (Signed)
Follow Up:    Patient is retuning a cal from today.l.

## 2023-01-17 NOTE — Telephone Encounter (Signed)
Pt sent a MyChart message stating her phone send calls from our office straight to voicemail.  She requests either someone either left a message or send her a MyChart message with results/changes.  Marland Kitchen

## 2023-01-17 NOTE — Telephone Encounter (Signed)
See result note/mychart communication with patient.

## 2023-01-20 ENCOUNTER — Ambulatory Visit: Payer: 59 | Admitting: Family Medicine

## 2023-01-20 ENCOUNTER — Encounter: Payer: Self-pay | Admitting: *Deleted

## 2023-01-20 ENCOUNTER — Encounter: Payer: Self-pay | Admitting: Family Medicine

## 2023-01-20 VITALS — BP 134/70 | HR 73 | Temp 98.8°F | Ht 61.5 in | Wt 194.4 lb

## 2023-01-20 DIAGNOSIS — H409 Unspecified glaucoma: Secondary | ICD-10-CM

## 2023-01-20 DIAGNOSIS — I484 Atypical atrial flutter: Secondary | ICD-10-CM

## 2023-01-20 DIAGNOSIS — E7800 Pure hypercholesterolemia, unspecified: Secondary | ICD-10-CM

## 2023-01-20 DIAGNOSIS — G6289 Other specified polyneuropathies: Secondary | ICD-10-CM

## 2023-01-20 DIAGNOSIS — N1832 Chronic kidney disease, stage 3b: Secondary | ICD-10-CM

## 2023-01-20 DIAGNOSIS — K219 Gastro-esophageal reflux disease without esophagitis: Secondary | ICD-10-CM | POA: Diagnosis not present

## 2023-01-20 DIAGNOSIS — I5032 Chronic diastolic (congestive) heart failure: Secondary | ICD-10-CM | POA: Diagnosis not present

## 2023-01-20 DIAGNOSIS — M797 Fibromyalgia: Secondary | ICD-10-CM

## 2023-01-20 DIAGNOSIS — M3501 Sicca syndrome with keratoconjunctivitis: Secondary | ICD-10-CM | POA: Diagnosis not present

## 2023-01-20 DIAGNOSIS — T466X5A Adverse effect of antihyperlipidemic and antiarteriosclerotic drugs, initial encounter: Secondary | ICD-10-CM

## 2023-01-20 DIAGNOSIS — E538 Deficiency of other specified B group vitamins: Secondary | ICD-10-CM

## 2023-01-20 DIAGNOSIS — I10 Essential (primary) hypertension: Secondary | ICD-10-CM

## 2023-01-20 DIAGNOSIS — Z66 Do not resuscitate: Secondary | ICD-10-CM

## 2023-01-20 DIAGNOSIS — Z0000 Encounter for general adult medical examination without abnormal findings: Secondary | ICD-10-CM

## 2023-01-20 DIAGNOSIS — F331 Major depressive disorder, recurrent, moderate: Secondary | ICD-10-CM

## 2023-01-20 DIAGNOSIS — Z9889 Other specified postprocedural states: Secondary | ICD-10-CM

## 2023-01-20 DIAGNOSIS — Z860101 Personal history of adenomatous and serrated colon polyps: Secondary | ICD-10-CM | POA: Diagnosis not present

## 2023-01-20 DIAGNOSIS — R7303 Prediabetes: Secondary | ICD-10-CM

## 2023-01-20 DIAGNOSIS — Z789 Other specified health status: Secondary | ICD-10-CM

## 2023-01-20 DIAGNOSIS — M85852 Other specified disorders of bone density and structure, left thigh: Secondary | ICD-10-CM

## 2023-01-20 DIAGNOSIS — E519 Thiamine deficiency, unspecified: Secondary | ICD-10-CM

## 2023-01-20 DIAGNOSIS — I739 Peripheral vascular disease, unspecified: Secondary | ICD-10-CM

## 2023-01-20 DIAGNOSIS — M791 Myalgia, unspecified site: Secondary | ICD-10-CM | POA: Insufficient documentation

## 2023-01-20 DIAGNOSIS — N2581 Secondary hyperparathyroidism of renal origin: Secondary | ICD-10-CM | POA: Diagnosis not present

## 2023-01-20 DIAGNOSIS — Z7189 Other specified counseling: Secondary | ICD-10-CM

## 2023-01-20 MED ORDER — PANTOPRAZOLE SODIUM 40 MG PO TBEC: 40.0000 mg | DELAYED_RELEASE_TABLET | Freq: Every day | ORAL | 3 refills | Status: DC

## 2023-01-20 MED ORDER — EZETIMIBE 10 MG PO TABS: 10.0000 mg | ORAL_TABLET | Freq: Every day | ORAL | 3 refills | Status: DC

## 2023-01-20 MED ORDER — GABAPENTIN 300 MG PO CAPS: ORAL_CAPSULE | ORAL | 3 refills | Status: DC

## 2023-01-20 MED ORDER — LOSARTAN POTASSIUM 100 MG PO TABS: 50.0000 mg | ORAL_TABLET | Freq: Every day | ORAL | 3 refills | Status: DC

## 2023-01-20 MED ORDER — MAGNESIUM OXIDE -MG SUPPLEMENT 400 (240 MG) MG PO TABS: 400.0000 mg | ORAL_TABLET | Freq: Every day | ORAL | Status: AC

## 2023-01-20 NOTE — Assessment & Plan Note (Signed)
Continues lasix 40mg  daily. Recently saw cardiology.

## 2023-01-20 NOTE — Assessment & Plan Note (Signed)
Regularly sees psychiatrist.  °

## 2023-01-20 NOTE — Patient Instructions (Addendum)
Start magnesium oxide 400mg  daily over the counter. Schedule lab visit in 2 weeks to recheck potassium, magnesium, kidneys.  We will refer you to [facility] for colonoscopy.  I will order kidney ultrasound - we will call you to schedule this.  Good to see you today  Return in 4-6 months for follow up visit

## 2023-01-20 NOTE — Progress Notes (Signed)
Ph: [phone] Fax: [phone]   Patient ID: Alexandria Leach, female    DOB: 03-06-1951, 72 y.o.   MRN: [id number]  This visit was conducted in person.  BP 134/70   Pulse 73   Temp 98.8 F (37.1 C) (Oral)   Ht 5' 1.5" (1.562 m)   Wt 194 lb 6 oz (88.2 kg)   SpO2 99%   BMI 36.13 kg/m    CC: CPE Subjective:   HPI: Alexandria Leach is a 72 y.o. female presenting on 01/20/2023 for Medicare Wellness   Did not see health advisor this year   Hearing Screening   500Hz  1000Hz  2000Hz  4000Hz   Right ear 40 40 40 40  Left ear 25 40 25 20  Vision Screening - Comments:: Last eye exam, 07/2022.  Flowsheet Row Office Visit from 01/20/2023 in [facility] [place]  PHQ-2 Total Score 0          01/20/2023   12:29 PM 01/12/2023    9:20 AM 12/18/2022    9:18 AM 07/15/2022    8:54 AM 02/06/2022   11:27 AM  Fall Risk   Falls in the past year? 0 0 0 0 1  Number falls in past yr:   0  0  Injury with Fall?   0  1  Risk for fall due to :   No Fall Risks    Follow up   Falls evaluation completed     Hospitalization for multifocal pneumonia 11/2022 treated with IV rocephin/azithromycin followed by oral augmentin.   Atypical atrial flutter - followed by EP Dr Ladona Ridgel and cardiology Dr Jens Som on renally dosed dofetilide (Tikosyn) as well as cardizem, metoprolol and eliquis.  Known chronic HFpEF with mitral stenosis/regurg s/p repair - rec SBE ppx.   Sjogren's with keratoconjunctivitis sicca (ANA+, Ro+) and fibromyalgia followed by Dr Corliss Skains rheum.   Lovastatin stopped due to myalgias.   CKD - pending nephrology appt (referral placed 09/2022).   Preventative: COLONOSCOPY 07/2014; WNL Alexandria Leach)  COLONOSCOPY 08/2019 - 3 TAs, rpt 3 yrs ([facility])  Due for colonoscopy - requests referral to [facility].  [facility]  Well woman exam - s/p hysterectomy, ovaries remain. No pelvic pain, vaginal bleeding. Pelvic exam normal 2017.   DEXA T -1.1 hip, -0.2 spine 08/2015  DEXA 07/2021: T score -1.1 left femoral neck-stable osteopenia, not at increased risk of bone fracture. Discussed calcium intake, vit D supplementation, and regular weight bearing exercise.  Lung cancer screening - not eligible  Flu shot - declines  COVID vaccine - Pfizer 04/2019, 05/2019, booster 12/2019, 01/2021, latest 12/2021, 12/2022 Tetanus shot - will check with insurance  Pneumovax 2014, prevnar-13 2019, pneumovax 12/2018 Shingrix - discussed, declines  Advanced directive discussion - received and scanned 2017. Son Alexandria Leach is HCPOA then Alexandria Leach. Clear she does not want CPR, no intubation. Has DNR form at home. Jehovah's witness - no blood products.  Seat belt use discussed Sunscreen use and skin screen discussed  Non smoker  Alcohol - none  Dentist yearly  Eye exam yearly  Bowel - occasional constipation despite daily colace, miralax PRN  Bladder - some overflow incontinence - not bothersome  Lives with son, 1 dog Occupation: unemployed, on disability for fibromyalgia since 2008. Edu: HS Religion: Jehova's witness Activity: walks some  Diet: good water, fruits/vegetables some  No caffeine use     Relevant past medical, surgical, family and social history reviewed and updated as indicated.  Interim medical history since our last visit reviewed. Allergies and medications reviewed and updated. Outpatient Medications Prior to Visit  Medication Sig Dispense Refill   ALPRAZolam (XANAX) 0.5 MG tablet Take 1 tablet (0.5 mg total) by mouth 3 (three) times daily as needed for anxiety. 270 tablet 2   antiseptic oral rinse (BIOTENE) LIQD 15 mLs by Mouth Rinse route 2 (two) times daily as needed for dry mouth.     cholecalciferol (VITAMIN D) 1000 UNITS tablet Take 1,000 Units by mouth daily.     cyanocobalamin (VITAMIN B12) 1000 MCG tablet Take 1 tablet (1,000 mcg total) by mouth once a week.     diltiazem (CARDIZEM CD) 360 MG 24 hr capsule TAKE 1  CAPSULE BY MOUTH DAILY 90 capsule 3   docusate sodium (COLACE) 100 MG capsule Take 100 mg by mouth daily.     dofetilide (TIKOSYN) 250 MCG capsule Take 1 capsule (250 mcg total) by mouth 2 (two) times daily. 180 capsule 2   ELIQUIS 5 MG TABS tablet TAKE 1 TABLET BY MOUTH TWICE  DAILY 200 tablet 2   furosemide (LASIX) 20 MG tablet Take 2 tablets (40 mg total) by mouth daily. 60 tablet 0   loratadine (CLARITIN) 10 MG tablet Take 1 tablet (10 mg total) by mouth daily. 90 tablet 3   metoprolol tartrate (LOPRESSOR) 50 MG tablet TAKE 1 AND 1/2 TABLETS BY MOUTH  TWICE DAILY 300 tablet 2   Polyethyl Glycol-Propyl Glycol (SYSTANE) 0.4-0.3 % GEL ophthalmic gel Place 1 drop into both eyes 2 (two) times daily.      potassium chloride (KLOR-CON M) 10 MEQ tablet Take 1 tablet (10 mEq total) by mouth 2 (two) times daily. 180 tablet 1   RESTASIS 0.05 % ophthalmic emulsion INSTILL ONE DROP IN BOTH  EYES TWO TIMES DAILY 180 each 0   temazepam (RESTORIL) 30 MG capsule TAKE 1 CAPSULE(30 MG) BY MOUTH AT BEDTIME AS NEEDED FOR SLEEP 90 capsule 2   thiamine (VITAMIN B1) 100 MG tablet Take 1 tablet (100 mg total) by mouth once a week.     traMADol (ULTRAM) 50 MG tablet TAKE 1 TABLET BY MOUTH DAILY AS  NEEDED 30 tablet 0   ezetimibe (ZETIA) 10 MG tablet TAKE 1 TABLET BY MOUTH AT  BEDTIME 100 tablet 2   gabapentin (NEURONTIN) 300 MG capsule TAKE 2 CAPSULES BY MOUTH IN THE  MORNING 1 CAPSULE BY MOUTH IN  THE AFTERNOON AND 2 CAPSULES BY  MOUTH AT BEDTIME (Patient taking differently: Take 300 mg by mouth 3 (three) times daily. TAKE 2 CAPSULES BY MOUTH IN THE MORNING 1 CAPSULE BY  MOUTH IN THE AFTERNOON AND  2 CAPSULES BY MOUTH AT  BEDTIME) 500 capsule 2   losartan (COZAAR) 100 MG tablet Take 0.5 tablets (50 mg total) by mouth daily. 50 tablet 2   lovastatin (MEVACOR) 10 MG tablet TAKE 1 TABLET BY MOUTH EVERY  MONDAY WEDNESDAY AND FRIDAY AT  NIGHT 31 tablet 3   pantoprazole (PROTONIX) 40 MG tablet TAKE 1 TABLET BY MOUTH DAILY 100  tablet 1   No facility-administered medications prior to visit.     Per HPI unless specifically indicated in ROS section below Review of Systems  Constitutional:  Negative for activity change, appetite change, chills, fatigue, fever and unexpected weight change.  HENT:  Negative for hearing loss.   Eyes:  Negative for visual disturbance.  Respiratory:  Negative for cough, chest tightness, shortness of breath and wheezing.   Cardiovascular:  Negative for  chest pain, palpitations and leg swelling.  Gastrointestinal:  Negative for abdominal distention, abdominal pain, blood in stool, constipation, diarrhea, nausea and vomiting.  Genitourinary:  Negative for difficulty urinating and hematuria.  Musculoskeletal:  Negative for arthralgias, myalgias and neck pain.  Skin:  Negative for rash.  Neurological:  Negative for dizziness, seizures, syncope and headaches.  Hematological:  Negative for adenopathy. Bruises/bleeds easily.  Psychiatric/Behavioral:  Negative for dysphoric mood. The patient is not nervous/anxious.     Objective:  BP 134/70   Pulse 73   Temp 98.8 F (37.1 C) (Oral)   Ht 5' 1.5" (1.562 m)   Wt 194 lb 6 oz (88.2 kg)   SpO2 99%   BMI 36.13 kg/m   Wt Readings from Last 3 Encounters:  01/20/23 194 lb 6 oz (88.2 kg)  01/14/23 195 lb (88.5 kg)  12/26/22 194 lb (88 kg)      Physical Exam Vitals and nursing note reviewed.  Constitutional:      Appearance: Normal appearance. She is not ill-appearing.  HENT:     Head: Normocephalic and atraumatic.     Right Ear: Tympanic membrane, ear canal and external ear normal. There is no impacted cerumen.     Left Ear: Tympanic membrane, ear canal and external ear normal. There is no impacted cerumen.     Nose: Nose normal.     Mouth/Throat:     Mouth: Mucous membranes are moist.     Pharynx: Oropharynx is clear. No oropharyngeal exudate or posterior oropharyngeal erythema.  Eyes:     General:        Right eye: No discharge.         Left eye: No discharge.     Extraocular Movements: Extraocular movements intact.     Conjunctiva/sclera: Conjunctivae normal.     Pupils: Pupils are equal, round, and reactive to light.  Neck:     Thyroid: No thyroid mass or thyromegaly.     Vascular: No carotid bruit.  Cardiovascular:     Rate and Rhythm: Normal rate and regular rhythm.     Pulses: Normal pulses.     Heart sounds: Murmur (2/6 systolic) heard.  Pulmonary:     Effort: Pulmonary effort is normal. No respiratory distress.     Breath sounds: Normal breath sounds. No wheezing, rhonchi or rales.  Abdominal:     General: Bowel sounds are normal. There is no distension.     Palpations: Abdomen is soft. There is no mass.     Tenderness: There is no abdominal tenderness. There is no guarding or rebound.     Hernia: No hernia is present.  Musculoskeletal:     Cervical back: Normal range of motion and neck supple. No rigidity.     Right lower leg: No edema.     Left lower leg: No edema.  Lymphadenopathy:     Cervical: No cervical adenopathy.  Skin:    General: Skin is warm and dry.     Findings: No rash.  Neurological:     General: No focal deficit present.     Mental Status: She is alert. Mental status is at baseline.     Comments:  Recall3/3 Calculation 5/5 DLROW  Psychiatric:        Mood and Affect: Mood normal.        Behavior: Behavior normal.       Results for orders placed or performed in visit on 01/14/23  Basic Metabolic Panel (BMET)  Result Value Ref Range  Glucose 103 (H) 70 - 99 mg/dL   BUN 13 8 - 27 mg/dL   Creatinine, Ser 1.61 (H) 0.57 - 1.00 mg/dL   eGFR 39 (L) >09 UE/AVW/0.98   BUN/Creatinine Ratio 9 (L) 12 - 28   Sodium 142 134 - 144 mmol/L   Potassium 3.7 3.5 - 5.2 mmol/L   Chloride 105 96 - 106 mmol/L   CO2 23 20 - 29 mmol/L   Calcium 10.2 8.7 - 10.3 mg/dL  Magnesium  Result Value Ref Range   Magnesium 1.8 1.6 - 2.3 mg/dL    Assessment & Plan:   Problem List Items Addressed  This Visit     Refusal of blood product (Chronic)   Health maintenance examination (Chronic)    Preventative protocols reviewed and updated unless pt declined. Discussed healthy diet and lifestyle.       Advanced care planning/counseling discussion (Chronic)    Previously discussed      Medicare annual wellness visit, subsequent - Primary (Chronic)    I have personally reviewed the Medicare Annual Wellness questionnaire and have noted 1. The patient's medical and social history 2. Their use of alcohol, tobacco or illicit drugs 3. Their current medications and supplements 4. The patient's functional ability including ADL's, fall risks, home safety risks and hearing or visual impairment. Cognitive function has been assessed and addressed as indicated.  5. Diet and physical activity 6. Evidence for depression or mood disorders The patients weight, height, BMI have been recorded in the chart. I have made referrals, counseling and provided education to the patient based on review of the above and I have provided the pt with a written personalized care plan for preventive services. Provider list updated.. See scanned questionairre as needed for further documentation. Reviewed preventative protocols and updated unless pt declined.       DNR no code (do not resuscitate) (Chronic)    Confirmed.       HYPERCHOLESTEROLEMIA    Chronic, deteriorated, off lovastatin for a year, continues ezetimibe. Consider PCSK9i.  The 10-year ASCVD risk score (Arnett DK, et al., 2019) is: 14.1%   Values used to calculate the score:     Age: 19 years     Sex: Female     Is Non-Hispanic African American: Yes     Diabetic: No     Tobacco smoker: No     Systolic Blood Pressure: 134 mmHg     Is BP treated: Yes     HDL Cholesterol: 61.2 mg/dL     Total Cholesterol: 216 mg/dL       Relevant Medications   ezetimibe (ZETIA) 10 MG tablet   losartan (COZAAR) 100 MG tablet   MDD (major depressive disorder),  recurrent episode (HCC)    Regularly sees psychiatrist.       Essential hypertension    Chronic, stable on current regimen - continue.  Goal BP <130/80 in CKD       Relevant Medications   ezetimibe (ZETIA) 10 MG tablet   losartan (COZAAR) 100 MG tablet   History of mitral valve repair    Consider SBE ppx      (HFpEF) heart failure with preserved ejection fraction (HCC)    Continues lasix 40mg  daily. Recently saw cardiology.       Relevant Medications   ezetimibe (ZETIA) 10 MG tablet   losartan (COZAAR) 100 MG tablet   Sjogren's syndrome (HCC)    Appreciate rheumatology care      CKD (chronic kidney disease) stage 3,  GFR 30-59 ml/min (HCC)    Reviewed CKD range 30-40s over the past 5 years, encouraged continued BP and sugar control.  Update renal US.  Normal SPEP 03/2022 Previously saw nephrology Dr Stephens Shire.       Relevant Orders   Renal function panel   Magnesium   US Renal   Glaucoma   Fibromyalgia   Relevant Medications   gabapentin (NEURONTIN) 300 MG capsule   Osteopenia    Continue calcium in diet, regular weight bearing exercise, vit D supplementation.       Peripheral neuropathy    Continues gabapentin 1500mg  daily.       Relevant Medications   gabapentin (NEURONTIN) 300 MG capsule   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HCC)    Continue eliquis and zetia.       Relevant Medications   ezetimibe (ZETIA) 10 MG tablet   losartan (COZAAR) 100 MG tablet   Secondary hyperparathyroidism of renal origin (HCC)    Continue to monitor this.  PTH goal <150.       Atrial flutter (HCC)    Followed by EP on eliquis.       Relevant Medications   ezetimibe (ZETIA) 10 MG tablet   losartan (COZAAR) 100 MG tablet   Prediabetes    Reviewed limiting sugar in diet.       Low serum vitamin B12    Continue once weekly vit B12 replacement  with good effect.       Vitamin B1 deficiency    Continue once weekly B1 replacement       Chronic GERD    Continues  pantoprazole 40mg  daily       Relevant Medications   pantoprazole (PROTONIX) 40 MG tablet   History of adenomatous polyp of colon    Will refer to [facility] per pt preference. She previously saw [facility] but desires to establish with [facility] provider for ease of communication.      Relevant Orders   Ambulatory referral to Gastroenterology   Myalgia due to statin    Has decide to avoid statins. Continues zetia.         Meds ordered this encounter  Medications   pantoprazole (PROTONIX) 40 MG tablet    Sig: Take 1 tablet (40 mg total) by mouth daily.    Dispense:  100 tablet    Refill:  3   magnesium oxide (MAG-OX) 400 (240 Mg) MG tablet    Sig: Take 1 tablet (400 mg total) by mouth daily.   ezetimibe (ZETIA) 10 MG tablet    Sig: Take 1 tablet (10 mg total) by mouth at bedtime.    Dispense:  100 tablet    Refill:  3    Please send a replace/Alexandria response with 100-Day Supply if appropriate to maximize member benefit. Requesting 1 year supply.   gabapentin (NEURONTIN) 300 MG capsule    Sig: TAKE 2 CAPSULES BY MOUTH IN THE MORNING 1 CAPSULE BY  MOUTH IN THE AFTERNOON AND  2 CAPSULES BY MOUTH AT  BEDTIME    Dispense:  500 capsule    Refill:  3    Please send a replace/Alexandria response with 100-Day Supply if appropriate to maximize member benefit. Requesting 1 year supply.   losartan (COZAAR) 100 MG tablet    Sig: Take 0.5 tablets (50 mg total) by mouth daily.    Dispense:  50 tablet    Refill:  3    Please send a replace/Alexandria response with 100-Day Supply if appropriate to maximize  member benefit. Requesting 1 year supply.    Orders Placed This Encounter  Procedures   US Renal    Standing Status:   Future    Standing Expiration Date:   01/20/2024    Order Specific Question:   Reason for Exam (SYMPTOM  OR DIAGNOSIS REQUIRED)    Answer:   CKD stage 3    Order Specific Question:   Preferred imaging location?    Answer:   [facility]   Renal function panel    Standing Status:   Future     Standing Expiration Date:   01/20/2024   Magnesium    Standing Status:   Future    Standing Expiration Date:   01/20/2024   Ambulatory referral to Gastroenterology    Referral Priority:   Routine    Referral Type:   Consultation    Referral Reason:   Specialty Services Required    Number of Visits Requested:   1    Patient Instructions  Start magnesium oxide 400mg  daily over the counter. Schedule lab visit in 2 weeks to recheck potassium, magnesium, kidneys.  We will refer you to [facility] for colonoscopy.  I will order kidney ultrasound - we will call you to schedule this.  Good to see you today  Return in 4-6 months for follow up visit   Follow up plan: Return in about 6 months (around 07/20/2023) for follow up visit.  Eustaquio Boyden, MD

## 2023-01-20 NOTE — Assessment & Plan Note (Signed)

## 2023-01-20 NOTE — Assessment & Plan Note (Signed)
Chronic, deteriorated, off lovastatin for a year, continues ezetimibe. Consider PCSK9i.  The 10-year ASCVD risk score (Arnett DK, et al., 2019) is: 14.1%   Values used to calculate the score:     Age: 73 years     Sex: Female     Is Non-Hispanic African American: Yes     Diabetic: No     Tobacco smoker: No     Systolic Blood Pressure: 134 mmHg     Is BP treated: Yes     HDL Cholesterol: 61.2 mg/dL     Total Cholesterol: 216 mg/dL

## 2023-01-20 NOTE — Assessment & Plan Note (Signed)
Confirmed.

## 2023-01-20 NOTE — Assessment & Plan Note (Signed)
Consider SBE ppx

## 2023-01-20 NOTE — Assessment & Plan Note (Signed)
Continue eliquis and zetia.

## 2023-01-20 NOTE — Assessment & Plan Note (Signed)
Followed by EP on eliquis.

## 2023-01-20 NOTE — Assessment & Plan Note (Signed)
Reviewed limiting sugar in diet.

## 2023-01-20 NOTE — Assessment & Plan Note (Signed)
Appreciate rheumatology care. ?

## 2023-01-20 NOTE — Assessment & Plan Note (Signed)
Previously discussed.

## 2023-01-20 NOTE — Assessment & Plan Note (Signed)
Continue once weekly B1 replacement

## 2023-01-20 NOTE — Assessment & Plan Note (Signed)
Continue to monitor this.  PTH goal <150.

## 2023-01-20 NOTE — Assessment & Plan Note (Signed)
Chronic, stable on current regimen - continue.  Goal BP <130/80 in CKD

## 2023-01-20 NOTE — Assessment & Plan Note (Signed)
Will refer to [facility] per pt preference. She previously saw [facility] but desires to establish with [facility] provider for ease of communication.

## 2023-01-20 NOTE — Assessment & Plan Note (Signed)
Continues gabapentin 1500mg  daily.

## 2023-01-20 NOTE — Assessment & Plan Note (Signed)
Continues pantoprazole 40mg daily.  ?

## 2023-01-20 NOTE — Assessment & Plan Note (Signed)
Preventative protocols reviewed and updated unless pt declined. Discussed healthy diet and lifestyle.  

## 2023-01-20 NOTE — Assessment & Plan Note (Addendum)
Reviewed CKD range 30-40s over the past 5 years, encouraged continued BP and sugar control.  Update renal US.  Normal SPEP 03/2022 Previously saw nephrology Dr Stephens Shire.

## 2023-01-20 NOTE — Assessment & Plan Note (Signed)
Has decide to avoid statins. Continues zetia.

## 2023-01-20 NOTE — Assessment & Plan Note (Signed)
Continue once weekly vit B12 replacement  with good effect.

## 2023-01-20 NOTE — Assessment & Plan Note (Signed)
Continue calcium in diet, regular weight bearing exercise, vit D supplementation.

## 2023-01-22 ENCOUNTER — Ambulatory Visit
Admission: RE | Admit: 2023-01-22 | Discharge: 2023-01-22 | Disposition: A | Discharge status: Home / Self Care | Payer: 59 | Source: Ambulatory Visit | Attending: Family Medicine | Admitting: Family Medicine

## 2023-01-22 DIAGNOSIS — N1832 Chronic kidney disease, stage 3b: Secondary | ICD-10-CM

## 2023-01-22 DIAGNOSIS — N183 Chronic kidney disease, stage 3 unspecified: Secondary | ICD-10-CM | POA: Diagnosis not present

## 2023-01-23 ENCOUNTER — Telehealth: Payer: Self-pay | Admitting: Gastroenterology

## 2023-01-23 NOTE — Telephone Encounter (Signed)
Good Morning Dr. Adela Lank,   Supervising MD for today 11/7 AM    Patient called stating that she had a referral to have a colonoscopy. Patients last colonoscopy was in 2016 and then had a EGD in 2021 with [facility]. Patient is not currently having any issues. Patient is requesting transfer of care due to all of her providers being [facility] and is wanting to have her colonoscopy with a [facility] practice. Patients records are under the procedure tab. Will you please review and advise on scheduling patient?   Thank you.

## 2023-01-23 NOTE — Telephone Encounter (Signed)
The patient had a normal colonoscopy in May 2016 was told to repeat in 10 years from that time.  For routine screening purposes she would not be due until May 2026.  If she wants to be seen in the interim by our practice for symptoms that bother her that is fine we can book her routine office visit.  Thanks

## 2023-01-28 NOTE — Telephone Encounter (Signed)
Lvm for patient advising her of recommendations.

## 2023-01-30 ENCOUNTER — Other Ambulatory Visit (HOSPITAL_COMMUNITY): Payer: Self-pay | Admitting: Physician Assistant

## 2023-02-03 ENCOUNTER — Other Ambulatory Visit (INDEPENDENT_AMBULATORY_CARE_PROVIDER_SITE_OTHER): Payer: 59

## 2023-02-03 ENCOUNTER — Ambulatory Visit (INDEPENDENT_AMBULATORY_CARE_PROVIDER_SITE_OTHER)
Admission: RE | Admit: 2023-02-03 | Discharge: 2023-02-03 | Disposition: A | Discharge status: Home / Self Care | Payer: 59 | Source: Ambulatory Visit | Attending: Family Medicine | Admitting: Family Medicine

## 2023-02-03 DIAGNOSIS — J189 Pneumonia, unspecified organism: Secondary | ICD-10-CM

## 2023-02-03 DIAGNOSIS — N1832 Chronic kidney disease, stage 3b: Secondary | ICD-10-CM | POA: Diagnosis not present

## 2023-02-03 DIAGNOSIS — R918 Other nonspecific abnormal finding of lung field: Secondary | ICD-10-CM | POA: Diagnosis not present

## 2023-02-03 LAB — RENAL FUNCTION PANEL
Albumin: 4.2 g/dL (ref 3.5–5.2)
BUN: 15 mg/dL (ref 6–23)
CO2: 23 meq/L (ref 19–32)
Calcium: 9.9 mg/dL (ref 8.4–10.5)
Chloride: 107 meq/L (ref 96–112)
Creatinine, Ser: 1.62 mg/dL — ABNORMAL HIGH (ref 0.40–1.20)
GFR: 31.99 mL/min — ABNORMAL LOW (ref >60.00)
Glucose, Bld: 90 mg/dL (ref 70–99)
Phosphorus: 3.1 mg/dL (ref 2.3–4.6)
Potassium: 4.2 meq/L (ref 3.5–5.1)
Sodium: 140 meq/L (ref 135–145)

## 2023-02-03 LAB — MAGNESIUM: Magnesium: 2.1 mg/dL (ref 1.5–2.5)

## 2023-02-06 ENCOUNTER — Ambulatory Visit: Payer: Self-pay

## 2023-02-06 NOTE — Patient Outreach (Signed)
  Care Coordination   02/06/2023 Name: Alexandria Leach MRN: [id number] DOB: 01-Jun-1950   Care Coordination Outreach Attempts:  Successful contact made with patient.  Patient requested return call due to [facility] calling later than established appointment.   Follow Up Plan:  Additional outreach attempts will be made to offer the patient care coordination information and services.   Encounter Outcome:  Patient Request to Call Back  Telephone appointment rescheduled for 02/19/23.   Care Coordination Interventions:  No, not indicated    [name] RN,BSN,CCM [facility]  [facility] coordinator / Case Manager Phone: [phone]

## 2023-02-07 ENCOUNTER — Other Ambulatory Visit: Payer: Self-pay | Admitting: *Deleted

## 2023-02-07 DIAGNOSIS — I4892 Unspecified atrial flutter: Secondary | ICD-10-CM

## 2023-02-07 MED ORDER — APIXABAN 5 MG PO TABS: 5.0000 mg | ORAL_TABLET | Freq: Two times a day (BID) | ORAL | 1 refills | Status: DC

## 2023-02-07 NOTE — Telephone Encounter (Signed)
Eliquis 5mg  refill request received. Patient is 72 years old, weight-88.2kg, Crea-1.62 on 02/03/23, Diagnosis-Aflutter, and last seen by Otilio Saber on 01/14/23. Dose is appropriate based on dosing criteria. Will send in refill to requested pharmacy.

## 2023-02-10 ENCOUNTER — Encounter: Payer: Self-pay | Admitting: Family Medicine

## 2023-02-10 NOTE — Telephone Encounter (Signed)
Hi Alexandria Leach, it looks like a referral was placed on 10/29/22 to [facility]- [place].

## 2023-02-12 ENCOUNTER — Other Ambulatory Visit (HOSPITAL_COMMUNITY): Payer: Self-pay | Admitting: Psychiatry

## 2023-02-17 NOTE — Telephone Encounter (Signed)
Noted. Pt has been in touch with [facility].

## 2023-02-19 ENCOUNTER — Ambulatory Visit: Payer: Self-pay

## 2023-02-19 NOTE — Patient Outreach (Signed)
  Care Coordination   Follow Up Visit Note   02/19/2023 Name: Alexandria Leach MRN: [id number] DOB: 05-Mar-1951  Alexandria Leach is a 72 y.o. year old female who sees Eustaquio Boyden, MD for primary care. I spoke with  Oren Binet Boyko by phone today.  What matters to the patients health and wellness today?  Patient denies any increase in HF or atrial fibrillation symptoms. Patient reports having upcoming visit in 02/2023 with atrial fibrillation clinic.  Patient states she continues to walk for exercise. She states she is having to walk more in her home due to colder temperatures.  Patient states she is not checking her BP as much anymore because  BP was doing ok. Patient states her blood pressures ranged from 130-140's/ 70-80's.   Patient states she is waiting to here from the nephrology office to schedule an appointment for new visit. She states she confirmed that a referral was received from her primary care provider.   Patient reports having life line access through her insurance provider.    Goals Addressed             This Visit's Progress    Ongoing improvement post hospitalization for pneumonia and management/ education of health conditions       Interventions Today    Flowsheet Row Most Recent Value  Chronic Disease   Chronic disease during today's visit Hypertension (HTN), Congestive Heart Failure (CHF), Atrial Fibrillation (AFib), Chronic Kidney Disease/End Stage Renal Disease (ESRD)  General Interventions   General Interventions Discussed/Reviewed General Interventions Reviewed, Doctor Visits, Labs  [evaluation of current treatment plan for HF/ Atrial fibrillation, BP,  CKD and patients adherence to plan as established by provider.  Assessed for HF, atrial fibrillation symptoms and BP readings.]  Labs Kidney Function, Hgb A1c annually  [reviewed kidney function lab panel / most recent Hgb A1c.  Discussed importance of keeping BP and blood sugar under control to  maintain health of kidneys.]  Doctor Visits Discussed/Reviewed Doctor Visits Reviewed  [facility] upcoming scheduled provider visits. Advised to keep follow up appointments with providers as recommended.]  Exercise Interventions   Exercise Discussed/Reviewed Physical Activity  [Assessed patients current physical activity. Encouraged patient to continue walking for exercise.]  Education Interventions   Education Provided Provided Education, Provided Printed Education  [Reviewed HF/ atrial fibrillation symptoms with patient. Advised to notify provider for symptoms and / or call 911 for severe symptoms. Advised to monitor and record BP at least 1-2 times per week. Advised not to take NSAIDS to preserve kidney function.]  Provided Verbal Education On Other  [education article on Chronic kidney disease sent to patient in MyChart.]  Pharmacy Interventions   Pharmacy Dicussed/Reviewed Pharmacy Topics Reviewed  Algis Downs to take medications as prescribed.]  Safety Interventions   Safety Discussed/Reviewed Fall Risk  [assessed for falls.]              SDOH assessments and interventions completed:  Yes  SDOH Interventions Today    Flowsheet Row Most Recent Value  SDOH Interventions   Food Insecurity Interventions Intervention Not Indicated  Housing Interventions Intervention Not Indicated  Transportation Interventions Intervention Not Indicated  Utilities Interventions Intervention Not Indicated        Care Coordination Interventions:  Yes, provided   Follow up plan: Follow up call scheduled for 04/06/22    Encounter Outcome:  Patient Visit Completed   [name] RN,BSN,CCM [facility]  [facility], [facility] coordinator / Case Manager Phone: [phone]

## 2023-02-19 NOTE — Patient Instructions (Signed)
Visit Information  Thank you for taking time to visit with me today. Please don't hesitate to contact me if I can be of assistance to you.   Following are the goals we discussed today:   Goals Addressed             This Visit's Progress    Ongoing improvement post hospitalization for pneumonia and management/ education of health conditions       Interventions Today    Flowsheet Row Most Recent Value  Chronic Disease   Chronic disease during today's visit Hypertension (HTN), Congestive Heart Failure (CHF), Atrial Fibrillation (AFib), Chronic Kidney Disease/End Stage Renal Disease (ESRD)  General Interventions   General Interventions Discussed/Reviewed General Interventions Reviewed, Doctor Visits, Labs  [evaluation of current treatment plan for HF/ Atrial fibrillation, BP,  CKD and patients adherence to plan as established by provider.  Assessed for HF, atrial fibrillation symptoms and BP readings.]  Labs Kidney Function, Hgb A1c annually  [reviewed kidney function lab panel / most recent Hgb A1c.  Discussed importance of keeping BP and blood sugar under control to maintain health of kidneys.]  Doctor Visits Discussed/Reviewed Doctor Visits Reviewed  [facility] upcoming scheduled provider visits. Advised to keep follow up appointments with providers as recommended.]  Exercise Interventions   Exercise Discussed/Reviewed Physical Activity  [Assessed patients current physical activity. Encouraged patient to continue walking for exercise.]  Education Interventions   Education Provided Provided Education  [Reviewed HF/ atrial fibrillation symptoms with patient. Advised to notify provider for symptoms and / or call 911 for severe symptoms. Advised to monitor and record BP at least 1-2 times per week. Advised not to take NSAIDS to preserve kidney function.]  Pharmacy Interventions   Pharmacy Dicussed/Reviewed Pharmacy Topics Reviewed  Algis Downs to take medications as prescribed.]  Safety  Interventions   Safety Discussed/Reviewed Fall Risk  [assessed for falls.]              Our next appointment is by telephone on 04/06/22 at 11 am  Please call the care guide team at [phone] if you need to cancel or reschedule your appointment.   If you are experiencing a Mental Health or Behavioral Health Crisis or need someone to talk to, please call the Suicide and Crisis Lifeline: 988 call 1-800-273-TALK (toll free, 24 hour hotline)  Patient verbalizes understanding of instructions and care plan provided today and agrees to view in MyChart. Active MyChart status and patient understanding of how to access instructions and care plan via MyChart confirmed with patient.     [name] RN,BSN,CCM [place]  [facility], [facility] coordinator / Case Manager Phone: [phone]'  Chronic Kidney Disease, Adult Chronic kidney disease is when lasting damage happens to the kidneys slowly over a long time. The kidneys help to: Make pee (urine). Make hormones. Keep the right amount of fluids and chemicals in the body. Most often, this disease does not go away. You must take steps to help keep the kidney damage from getting worse. If steps are not taken, the kidneys might stop working forever. What are the causes? Diabetes. High blood pressure. Diseases that affect the heart and blood vessels. Other kidney diseases. Diseases of the body's disease-fighting system. A problem with the flow of pee. Infections of the organs that make pee, store it, and take it out of the body. Swelling or irritation of your blood vessels. What increases the risk? Getting older. Having someone in your family who has kidney disease or kidney failure.  Having a disease caused by genes. Taking medicines often that harm the kidneys. Being near or having contact with harmful substances. Being very overweight. Using tobacco now or in the past. What are the signs or  symptoms? Feeling very tired. Having a swollen face, legs, ankles, or feet. Feeling like you may vomit or vomiting. Not feeling hungry. Being confused or not able to focus. Twitches and cramps in the leg muscles or other muscles. Dry, itchy skin. A taste of metal in your mouth. Making less pee, or making more pee. Shortness of breath. Trouble sleeping. You may also become anemic or get weak bones. Anemic means there is not enough red blood cells or hemoglobin in your blood. You may get symptoms slowly. You may not notice them until the kidney damage gets very bad. How is this treated? Often, there is no cure for this disease. Treatment can help with symptoms and help keep the disease from getting worse. You may need to: Avoid alcohol. Avoid foods that are high in salt, potassium, phosphorous, and protein. Take medicines for symptoms and to help control other conditions. Have dialysis. This treatment gets harmful waste out of your body. Treat other problems that cause your kidney disease or make it worse. Follow these instructions at home: Medicines Take over-the-counter and prescription medicines only as told by your doctor. Do not take any new medicines, vitamins, or supplements unless your doctor says it is okay. Lifestyle  Do not smoke or use any products that contain nicotine or tobacco. If you need help quitting, ask your doctor. If you drink alcohol: Limit how much you use to: 0-1 drink a day for women who are not pregnant. 0-2 drinks a day for men. Know how much alcohol is in your drink. In the [place] one drink equals one 12 oz bottle of beer (355 mL), one 5 oz glass of wine (148 mL), or one 1 oz glass of hard liquor (44 mL). Stay at a healthy weight. If you need help losing weight, ask your doctor. General instructions  Follow instructions from your doctor about what you cannot eat or drink. Track your blood pressure at home. Tell your doctor about any changes. If you  have diabetes, track your blood sugar. Exercise at least 30 minutes a day, 5 days a week. Keep your shots (vaccinations) up to date. Keep all follow-up visits. Where to find more information American Association of Kidney Patients: [employer]: www.kidney.org American [employer]: [employer]ee Life Options: www.lifeoptions.org Kidney School: www.kidneyschool.org Contact a doctor if: Your symptoms get worse. You get new symptoms. Get help right away if: You get symptoms of end-stage kidney disease. These include: Headaches. Losing feeling in your hands or feet. Easy bruising. Having hiccups often. Chest pain. Shortness of breath. Lack of menstrual periods, in women. You have a fever. You make less pee than normal. You have pain or you bleed when you pee or poop. These symptoms may be an emergency. Get help right away. Call your local emergency services (911 in the [place] Do not wait to see if the symptoms will go away. Do not drive yourself to the hospital. Summary Chronic kidney disease is when lasting damage happens to the kidneys slowly over a long time. Causes of this disease include diabetes and high blood pressure. Often, there is no cure for this disease. Treatment can help symptoms and help keep the disease from getting worse. Treatment may involve lifestyle changes, medicines, and dialysis. This information is not intended to  replace advice given to you by your health care provider. Make sure you discuss any questions you have with your health care provider. Document Revised: 06/09/2019 Document Reviewed: 06/09/2019 Elsevier Patient Education  2024 [employer].

## 2023-02-21 ENCOUNTER — Other Ambulatory Visit: Payer: Self-pay | Admitting: Cardiology

## 2023-02-24 ENCOUNTER — Encounter (HOSPITAL_COMMUNITY): Payer: Self-pay | Admitting: Physician Assistant

## 2023-02-24 ENCOUNTER — Encounter (HOSPITAL_COMMUNITY): Payer: Self-pay | Admitting: Psychiatry

## 2023-02-24 ENCOUNTER — Telehealth (HOSPITAL_COMMUNITY): Payer: 59 | Admitting: Psychiatry

## 2023-02-24 ENCOUNTER — Ambulatory Visit (HOSPITAL_COMMUNITY)
Admission: RE | Admit: 2023-02-24 | Discharge: 2023-02-24 | Disposition: A | Discharge status: Home / Self Care | Payer: 59 | Source: Ambulatory Visit | Attending: Physician Assistant | Admitting: Physician Assistant

## 2023-02-24 VITALS — BP 136/70 | HR 82 | Ht 61.5 in | Wt 197.2 lb

## 2023-02-24 DIAGNOSIS — D6869 Other thrombophilia: Secondary | ICD-10-CM | POA: Insufficient documentation

## 2023-02-24 DIAGNOSIS — Z9889 Other specified postprocedural states: Secondary | ICD-10-CM | POA: Insufficient documentation

## 2023-02-24 DIAGNOSIS — I130 Hypertensive heart and chronic kidney disease with heart failure and stage 1 through stage 4 chronic kidney disease, or unspecified chronic kidney disease: Secondary | ICD-10-CM | POA: Insufficient documentation

## 2023-02-24 DIAGNOSIS — F411 Generalized anxiety disorder: Secondary | ICD-10-CM

## 2023-02-24 DIAGNOSIS — F331 Major depressive disorder, recurrent, moderate: Secondary | ICD-10-CM | POA: Diagnosis not present

## 2023-02-24 DIAGNOSIS — N183 Chronic kidney disease, stage 3 unspecified: Secondary | ICD-10-CM | POA: Insufficient documentation

## 2023-02-24 DIAGNOSIS — Z5181 Encounter for therapeutic drug level monitoring: Secondary | ICD-10-CM

## 2023-02-24 DIAGNOSIS — I484 Atypical atrial flutter: Secondary | ICD-10-CM | POA: Insufficient documentation

## 2023-02-24 DIAGNOSIS — Z79899 Other long term (current) drug therapy: Secondary | ICD-10-CM | POA: Insufficient documentation

## 2023-02-24 DIAGNOSIS — Z7901 Long term (current) use of anticoagulants: Secondary | ICD-10-CM | POA: Insufficient documentation

## 2023-02-24 DIAGNOSIS — I5032 Chronic diastolic (congestive) heart failure: Secondary | ICD-10-CM | POA: Insufficient documentation

## 2023-02-24 MED ORDER — ZOLPIDEM TARTRATE 10 MG PO TABS: 10.0000 mg | ORAL_TABLET | Freq: Every evening | ORAL | 2 refills | Status: DC | PRN

## 2023-02-24 MED ORDER — ALPRAZOLAM 0.5 MG PO TABS: 0.5000 mg | ORAL_TABLET | Freq: Three times a day (TID) | ORAL | 2 refills | Status: DC | PRN

## 2023-02-24 NOTE — Progress Notes (Signed)
Virtual Visit via Telephone Note  I connected with Alexandria Leach on 02/24/23 at  1:00 PM EST by telephone and verified that I am speaking with the correct person using two identifiers.  Location: Patient: home Provider: office   I discussed the limitations, risks, security and privacy concerns of performing an evaluation and management service by telephone and the availability of in person appointments. I also discussed with the patient that there may be a patient responsible charge related to this service. The patient expressed understanding and agreed to proceed.      I discussed the assessment and treatment plan with the patient. The patient was provided an opportunity to ask questions and all were answered. The patient agreed with the plan and demonstrated an understanding of the instructions.   The patient was advised to call back or seek an in-person evaluation if the symptoms worsen or if the condition fails to improve as anticipated.  I provided 20 minutes of non-face-to-face time during this encounter.   [name], MD  [facility] MD/PA/NP OP Progress Note  02/24/2023 1:22 PM Alexandria Leach  MRN:  [id number]  Chief Complaint:  Chief Complaint  Patient presents with   Depression   Follow-up   Anxiety   HPI: This patient is a 72 year old separated black female lives alone in [place]. 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 She has 4 sons.   The patient returns for follow-up after 3 months regarding her anxiety and insomnia.  She states she was hospitalized for pneumonia in September and while there she was not given her Xanax or sleeping medication.  When she came home and got back on temazepam for sleep it has not been working anymore.  She only sleeps about 3 to 4 hours a night.  She is willing to retry Ambien.  She denies being depressed and her anxiety is under fairly good control.  She is  still having some shortness of breath. Visit Diagnosis:    ICD-10-CM   1. GAD (generalized anxiety disorder)  F41.1     2. Major depressive disorder, recurrent episode, moderate (HCC)  F33.1       Past Psychiatric History: none  Past Medical History:  Past Medical History:  Diagnosis Date   Ankle fracture, left 02/19/2015   from a fall   Anxiety    Cataract 2014   corrected with surgery   CHF (congestive heart failure) (HCC)    CKD (chronic kidney disease) stage 3, GFR 30-59 ml/min [facility])    saw nephrologist Dr Stephens Shire   Community acquired pneumonia 11/29/2022   Multifocal s/p hospitalization 11/2022     COVID-19 09/21/2021   DDD (degenerative disc disease), cervical    Depression    Fibromyalgia    Glaucoma    s/p surgery, sees ophtho Q6 mo   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 Thought benign.    Osteoarthritis    shoulders and knees, not RA per Dr Corliss Skains, positive ANA, positive Ro   Osteopenia 09/18/2015  DEXA T -1.1 hip, -0.2 spine 08/2015    Personal history of urinary calculi latest 2014   Pneumonia 12/02/2011   PONV (postoperative nausea and vomiting)    Refusal of blood transfusions as patient is Jehovah's Witness    Rheumatic heart disease 1980   s/p mitral valve repair 1980   Sjogren's syndrome (HCC)    Trimalleolar fracture of left ankle 02/23/2015    Past Surgical History:  Procedure Laterality Date   BREAST BIOPSY Right 2006   benign   CARDIOVERSION N/A 07/18/2020   Procedure: CARDIOVERSION;  Surgeon: Parke Poisson, MD;  Location: [facility] ENDOSCOPY;  Service: Cardiovascular;  Laterality: N/A;    CHOLECYSTECTOMY  11/27/2011   Procedure: LAPAROSCOPIC CHOLECYSTECTOMY WITH INTRAOPERATIVE CHOLANGIOGRAM;  Surgeon: Ernestene Mention, MD;  Location: [facility] OR;  Service: General;  Laterality: N/A;  laparoscopic cholecystectomy with choleangiogram umbilical hernia repair   COLONOSCOPY  07/2014   WNL Madilyn Fireman)   COLONOSCOPY  08/2019   3 TAs, rpt 3 yrs ([place])   dexa  08/2012   normal per patient - no records available   ESOPHAGOGASTRODUODENOSCOPY  08/2019   reactive gastropathy, neg H pylori ([place])   EYE SURGERY Bilateral 2014   cataract removal   MITRAL VALVE REPAIR  1980   open heart   ORIF ANKLE FRACTURE Left 02/26/2015   Procedure: OPEN REDUCTION INTERNAL FIXATION (ORIF) LEFT TRIMALLEOLAR ANKLE FRACTURE;  Surgeon: Tarry Kos, MD;  Location: [facility] OR;  Service: Orthopedics;  Laterality: Left;   TUBAL LIGATION  1980   UMBILICAL HERNIA REPAIR  11/27/2011   Procedure: HERNIA REPAIR UMBILICAL ADULT;  Surgeon: Ernestene Mention, MD;  Location: [facility] OR;  Service: General;  Laterality: N/A;   VAGINAL HYSTERECTOMY  1992   for fibroids -- partial, ovaries remain    Family Psychiatric History: See below  Family History:  Family History  Problem Relation Age of Onset   Cancer Mother        lung (nonsmoker)   CAD Mother        MI in her 25s   ALS Mother    Kidney disease Father    Alcohol abuse Father    Diabetes Father    Lupus Sister        and niece   Diabetes Sister    Stroke Sister    Depression Sister    Lupus Sister    Blindness Sister    Cancer Maternal Uncle        bone   Stroke Maternal Grandmother    Cancer Brother        bone   Diabetes Brother    Heart attack Brother    Healthy Son    Healthy Son    Healthy Son    Healthy Son    Kidney failure Other        on HD   Breast cancer Neg Hx     Social History:  Social History   Socioeconomic History   Marital status: Married    Spouse name: [name]   Number of children: 4   Years of education: 12   Highest  education level: 12th grade  Occupational History    Employer: UNEMPLOYED    Comment: Disability  Tobacco Use   Smoking status: Never    Passive exposure: Past   Smokeless tobacco: Never   Tobacco comments:    Never smoke 08/29/21  Vaping Use   Vaping status: Never Used  Substance and Sexual Activity   Alcohol use: No  Alcohol/week: 0.0 standard drinks of alcohol   Drug use: No   Sexual activity: Not Currently    Birth control/protection: Surgical  Other Topics Concern   Not on file  Social History Narrative   Lives with son, 1 dog   Occupation: unemployed, on disability for fibromyalgia since 2008.   Edu: HS   Religion: Jehova's witness   Activity: volunteers at senior center   Diet: some water, fruits/vegetables daily   No caffeine use   Social Determinants of Health   Financial Resource Strain: Low Risk  (01/16/2023)   Overall Financial Resource Strain (CARDIA)    Difficulty of Paying Living Expenses: Not hard at all  Food Insecurity: No Food Insecurity (02/19/2023)   Hunger Vital Sign    Worried About Running Out of Food in the Last Year: Never true    Ran Out of Food in the Last Year: Never true  Transportation Needs: No Transportation Needs (02/19/2023)   PRAPARE - Administrator, Civil Service (Medical): No    Lack of Transportation (Non-Medical): No  Physical Activity: Insufficiently Active (01/16/2023)   Exercise Vital Sign    Days of Exercise per Week: 3 days    Minutes of Exercise per Session: 30 min  Stress: No Stress Concern Present (01/16/2023)   [employer] of Occupational Health - Occupational Stress Questionnaire    Feeling of Stress : Not at all  Social Connections: Socially Integrated (01/16/2023)   Social Connection and Isolation Panel [NHANES]    Frequency of Communication with Friends and Family: More than three times a week    Frequency of Social Gatherings with Friends and Family: Three times a week    Attends Religious  Services: More than 4 times per year    Active Member of Clubs or Organizations: Yes    Attends Banker Meetings: More than 4 times per year    Marital Status: Married    Allergies:  Allergies  Allergen Reactions   Cymbalta [Duloxetine Hcl] Other (See Comments)    tachycardia   Statins Nausea Only and Other (See Comments)    Muscle cramps also   Sulfa Antibiotics Nausea And Vomiting    Metabolic Disorder Labs: Lab Results  Component Value Date   HGBA1C 6.1 01/07/2023   MPG 126 01/12/2021   MPG 114 10/13/2015   No results found for: "PROLACTIN" Lab Results  Component Value Date   CHOL 216 (H) 01/07/2023   TRIG 91.0 01/07/2023   HDL 61.20 01/07/2023   CHOLHDL 4 01/07/2023   VLDL 18.2 01/07/2023   LDLCALC 137 (H) 01/07/2023   LDLCALC 112 (H) 12/25/2021   Lab Results  Component Value Date   TSH 4.00 01/07/2023   TSH 2.00 01/04/2022    Therapeutic Level Labs: No results found for: "LITHIUM" No results found for: "VALPROATE" No results found for: "CBMZ"  Current Medications: Current Outpatient Medications  Medication Sig Dispense Refill   ALPRAZolam (XANAX) 0.5 MG tablet Take 1 tablet (0.5 mg total) by mouth 3 (three) times daily as needed for anxiety. 90 tablet 2   antiseptic oral rinse (BIOTENE) LIQD 15 mLs by Mouth Rinse route 2 (two) times daily as needed for dry mouth.     apixaban (ELIQUIS) 5 MG TABS tablet Take 1 tablet (5 mg total) by mouth 2 (two) times daily. 100 day supply 200 tablet 1   cholecalciferol (VITAMIN D) 1000 UNITS tablet Take 1,000 Units by mouth daily.     cyanocobalamin (VITAMIN B12) 1000 MCG  tablet Take 1 tablet (1,000 mcg total) by mouth once a week.     diltiazem (CARDIZEM CD) 360 MG 24 hr capsule TAKE 1 CAPSULE BY MOUTH DAILY 90 capsule 3   docusate sodium (COLACE) 100 MG capsule Take 100 mg by mouth daily.     dofetilide (TIKOSYN) 250 MCG capsule Take 1 capsule (250 mcg total) by mouth 2 (two) times daily. 180 capsule 2    ezetimibe (ZETIA) 10 MG tablet Take 1 tablet (10 mg total) by mouth at bedtime. 100 tablet 3   furosemide (LASIX) 20 MG tablet Take 2 tablets (40 mg total) by mouth daily. 60 tablet 0   gabapentin (NEURONTIN) 300 MG capsule TAKE 2 CAPSULES BY MOUTH IN THE MORNING 1 CAPSULE BY  MOUTH IN THE AFTERNOON AND  2 CAPSULES BY MOUTH AT  BEDTIME 500 capsule 3   loratadine (CLARITIN) 10 MG tablet Take 1 tablet (10 mg total) by mouth daily. 90 tablet 3   losartan (COZAAR) 100 MG tablet Take 0.5 tablets (50 mg total) by mouth daily. 50 tablet 3   magnesium oxide (MAG-OX) 400 (240 Mg) MG tablet Take 1 tablet (400 mg total) by mouth daily.     metoprolol tartrate (LOPRESSOR) 50 MG tablet TAKE 1 AND 1/2 TABLETS BY MOUTH  TWICE DAILY 300 tablet 2   pantoprazole (PROTONIX) 40 MG tablet Take 1 tablet (40 mg total) by mouth daily. 100 tablet 3   Polyethyl Glycol-Propyl Glycol (SYSTANE) 0.4-0.3 % GEL ophthalmic gel Place 1 drop into both eyes 2 (two) times daily.      potassium chloride (KLOR-CON M) 10 MEQ tablet Take 1 tablet (10 mEq total) by mouth 2 (two) times daily. 180 tablet 1   RESTASIS 0.05 % ophthalmic emulsion INSTILL ONE DROP IN BOTH  EYES TWO TIMES DAILY 180 each 0   thiamine (VITAMIN B1) 100 MG tablet Take 1 tablet (100 mg total) by mouth once a week.     traMADol (ULTRAM) 50 MG tablet TAKE 1 TABLET BY MOUTH DAILY AS  NEEDED 30 tablet 0   zolpidem (AMBIEN) 10 MG tablet Take 1 tablet (10 mg total) by mouth at bedtime as needed for sleep. 30 tablet 2   No current facility-administered medications for this visit.     Musculoskeletal: Strength & Muscle Tone: na Gait & Station: na Patient leans: N/A  Psychiatric Specialty Exam: Review of Systems  Respiratory:  Positive for shortness of breath.   Psychiatric/Behavioral:  Positive for sleep disturbance.   All other systems reviewed and are negative.   There were no vitals taken for this visit.There is no height or weight on file to calculate BMI.   General Appearance: na  Eye Contact:  NA  Speech:  Clear and Coherent  Volume:  Normal  Mood:  Euthymic  Affect:  NA  Thought Process:  Goal Directed  Orientation:  Full (Time, Place, and Person)  Thought Content: WDL   Suicidal Thoughts:  No  Homicidal Thoughts:  No  Memory:  Immediate;   Good Recent;   Good Remote;   NA  Judgement:  Good  Insight:  Good  Psychomotor Activity:  Decreased  Concentration:  Concentration: Good and Attention Span: Good  Recall:  Good  Fund of Knowledge: Good  Language: Good  Akathisia:  No  Handed:  Right  AIMS (if indicated): not done  Assets: Communication, social support  ADL's:  Intact  Cognition: WNL  Sleep:  Poor   Screenings: GAD-7    Flowsheet Row  Office Visit from 01/20/2023 in [facility] Visit from 12/18/2022 in [facility] Office Visit from 07/15/2022 in [facility] at [facility] Office Visit from 01/11/2022 in [facility] at [facility] Office Visit from 12/28/2019 in [facility]  Total GAD-7 Score 0 0 0 1 0      Mini-Mental    Flowsheet Row Clinical Support from 12/11/2017 in [facility] Clinical Support from 12/09/2016 in [facility] Clinical Support from 11/30/2015 in [facility]  Total Score (max 30 points ) 20 19 18       PHQ2-9    Flowsheet Row Office Visit from 01/20/2023 in [facility] at [facility] Office Visit from 12/18/2022 in [facility] Office Visit from 07/15/2022 in [facility] Office Visit from 01/11/2022 in [facility] [place] [facility] at [facility] Video Visit from 11/21/2021 in [facility] Total Score 0 0 0 1 0  PHQ-9  Total Score 2 0 3 4 --      Flowsheet Row ED to Hosp-Admission (Discharged) from 11/29/2022 in [facility] 3E HF PCU ED from 02/09/2022 in [facility] at [facility] Video Visit from 11/21/2021 in [facility] at [place]  C-SSRS RISK CATEGORY No Risk No Risk No Risk        Assessment and Plan: This patient is a 72 year old female with a history depression anxiety congestive heart failure Sjogren's syndrome.  She denies significant depression but still has difficulty sleeping.  Will therefore discontinue Restoril in favor of Ambien 10 mg at bedtime.  She will continue Xanax 0.5 mg up to 3 times daily for anxiety.  She will return to see me in 3 months  Collaboration of Care: Collaboration of Care: Primary Care Provider AEB notes are shared with PCP on the epic system  Patient/Guardian was advised Release of Information must be obtained prior to any record release in order to collaborate their care with an outside provider. Patient/Guardian was advised if they have not already done so to contact the registration department to sign all necessary forms in order for [place] to release information regarding their care.   Consent: Patient/Guardian gives verbal consent for treatment and assignment of benefits for services provided during this visit. Patient/Guardian expressed understanding and agreed to proceed.    [name], MD 02/24/2023, 1:22 PM

## 2023-02-24 NOTE — Progress Notes (Signed)
Primary Care Physician: Eustaquio Boyden, MD Referring Physician: Dr. Jens Som Primary EP: Dr Charisse Klinefelter Alexandria Leach is a 72 y.o. female with a h/o new onset typical atrial flutter in April 2022. H/o of rheumatic heart disease with remote repair pf mitral valve in 1980, CHF, CKD.  She was placed on anticoagulation and had a succession CV but ERAfl. She felt much improved while in SR. Patient is s/p dofetilide admission 8/8-8/11/22. She converted with medication and did not require DCCV.   On follow up today, patient reports that she has done well from a cardiac standpoint. She has had some residual SOB since her admission for pneumonia in September, had CXR with PCP. She remains in SR. No bleeding issues on anticoagulation.   Today, she denies symptoms of palpitations, orthopnea, PND, lower extremity edema, dizziness, presyncope, syncope, or neurologic sequela. The patient is tolerating medications without difficulties and is otherwise without complaint today.   Past Medical History:  Diagnosis Date   Ankle fracture, left 02/19/2015   from a fall   Anxiety    Cataract 2014   corrected with surgery   CHF (congestive heart failure) (HCC)    CKD (chronic kidney disease) stage 3, GFR 30-59 ml/min [facility])    saw nephrologist Dr Stephens Shire   Community acquired pneumonia 11/29/2022   Multifocal s/p hospitalization 11/2022     COVID-19 09/21/2021   DDD (degenerative disc disease), cervical    Depression    Fibromyalgia    Glaucoma    s/p surgery, sees ophtho Q6 mo   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 Thought benign.     Osteoarthritis    shoulders and knees, not RA per Dr Corliss Skains, positive ANA, positive Ro   Osteopenia 09/18/2015   DEXA T -1.1 hip, -0.2 spine 08/2015    Personal history of urinary calculi latest 2014   Pneumonia 12/02/2011   PONV (postoperative nausea and vomiting)    Refusal of blood transfusions as patient is Jehovah's Witness    Rheumatic heart disease 1980   s/p mitral valve repair 1980   Sjogren's syndrome (HCC)    Trimalleolar fracture of left ankle 02/23/2015    Current Outpatient Medications  Medication Sig Dispense Refill   ALPRAZolam (XANAX) 0.5 MG tablet TAKE 1 TABLET(0.5 MG) BY MOUTH THREE TIMES DAILY AS NEEDED FOR ANXIETY 90 tablet 2   antiseptic oral rinse (BIOTENE) LIQD 15 mLs by Mouth Rinse route 2 (two) times daily as needed for dry mouth.     apixaban (ELIQUIS) 5 MG TABS tablet Take 1 tablet (5 mg total) by mouth 2 (two) times daily. 100 day supply 200 tablet 1   cholecalciferol (VITAMIN D) 1000 UNITS tablet Take 1,000 Units by mouth daily.     cyanocobalamin (VITAMIN B12) 1000 MCG tablet Take 1 tablet (1,000 mcg total) by mouth once a week.     diltiazem (CARDIZEM CD) 360 MG 24 hr capsule TAKE 1 CAPSULE BY MOUTH DAILY 90 capsule 3   docusate sodium (COLACE) 100 MG capsule Take 100 mg by mouth daily.     dofetilide (  TIKOSYN) 250 MCG capsule Take 1 capsule (250 mcg total) by mouth 2 (two) times daily. 180 capsule 2   ezetimibe (ZETIA) 10 MG tablet Take 1 tablet (10 mg total) by mouth at bedtime. 100 tablet 3   furosemide (LASIX) 20 MG tablet Take 2 tablets (40 mg total) by mouth daily. 60 tablet 0   gabapentin (NEURONTIN) 300 MG capsule TAKE 2 CAPSULES BY MOUTH IN THE MORNING 1 CAPSULE BY  MOUTH IN THE AFTERNOON AND  2 CAPSULES BY MOUTH AT  BEDTIME 500 capsule 3   loratadine (CLARITIN) 10 MG tablet Take 1 tablet (10 mg total) by mouth daily. 90 tablet 3   losartan (COZAAR) 100 MG tablet Take 0.5 tablets (50 mg total) by mouth daily. 50 tablet 3   magnesium oxide  (MAG-OX) 400 (240 Mg) MG tablet Take 1 tablet (400 mg total) by mouth daily.     metoprolol tartrate (LOPRESSOR) 50 MG tablet TAKE 1 AND 1/2 TABLETS BY MOUTH  TWICE DAILY 300 tablet 2   pantoprazole (PROTONIX) 40 MG tablet Take 1 tablet (40 mg total) by mouth daily. 100 tablet 3   Polyethyl Glycol-Propyl Glycol (SYSTANE) 0.4-0.3 % GEL ophthalmic gel Place 1 drop into both eyes 2 (two) times daily.      potassium chloride (KLOR-CON M) 10 MEQ tablet Take 1 tablet (10 mEq total) by mouth 2 (two) times daily. 180 tablet 1   RESTASIS 0.05 % ophthalmic emulsion INSTILL ONE DROP IN BOTH  EYES TWO TIMES DAILY 180 each 0   temazepam (RESTORIL) 30 MG capsule TAKE 1 CAPSULE(30 MG) BY MOUTH AT BEDTIME AS NEEDED FOR SLEEP 90 capsule 2   thiamine (VITAMIN B1) 100 MG tablet Take 1 tablet (100 mg total) by mouth once a week.     traMADol (ULTRAM) 50 MG tablet TAKE 1 TABLET BY MOUTH DAILY AS  NEEDED 30 tablet 0   No current facility-administered medications for this encounter.    ROS- All systems are reviewed and negative except as per the HPI above  Physical Exam: Vitals:   02/24/23 0847  BP: 136/70  Pulse: 82  Weight: 89.4 kg  Height: 5' 1.5" (1.562 m)     Wt Readings from Last 3 Encounters:  02/24/23 89.4 kg  01/20/23 88.2 kg  01/14/23 88.5 kg    GEN: Well nourished, well developed in no acute distress NECK: No JVD; No carotid bruits CARDIAC: Regular rate and rhythm, no murmurs, rubs, gallops RESPIRATORY:  Clear to auscultation without rales, wheezing or rhonchi  ABDOMEN: Soft, non-tender, non-distended EXTREMITIES:  No edema; No deformity    EKG today demonstrates  SR Vent. rate 82 BPM PR interval 200 ms QRS duration 80 ms QT/QTcB 400/467 ms   CHA2DS2-VASc Score = 4  The patient's score is based upon: CHF History: 1 HTN History: 1 Diabetes History: 0 Stroke History: 0 Vascular Disease History: 0 Age Score: 1 Gender Score: 1       ASSESSMENT AND PLAN: Atypical atrial  flutter The patient's CHA2DS2-VASc score is 4, indicating a 4.8% annual risk of stroke.   S/p dofetilide admission 8/8-8/11/22 Patient appears to be maintaining SR Continue dofetilide 250 mcg BID, QT stable Recent bmet and mag reviewed. CrCl still OK for 250 mcg dose. Continue Eliquis 5 mg BID Continue diltiazem 360 mg daily Continue Lopressor 75 mg BID  Secondary Hypercoagulable State (ICD10:  D68.69) The patient is at significant risk for stroke/thromboembolism based upon her CHA2DS2-VASc Score of 4.  Continue Apixaban (Eliquis).  Chronic HFpEF  Fluid status appears stable  HTN Stable on current regimen   Follow up with Dr Ladona Ridgel per recall. AF clinic in one year.    Jorja Loa PA-C [facility] [address] [place], [place] [address] [phone]

## 2023-02-25 ENCOUNTER — Other Ambulatory Visit: Payer: Self-pay | Admitting: Family Medicine

## 2023-03-24 ENCOUNTER — Encounter (HOSPITAL_COMMUNITY): Payer: Self-pay

## 2023-03-31 NOTE — Progress Notes (Signed)
Office Visit Note  Patient: Alexandria Leach             Date of Birth: 02/19/1951           MRN: [id number]             PCP: Alexandria Boyden, MD Referring: Alexandria Boyden, MD Visit Date: 04/14/2023 Occupation: @GUAROCC @  Subjective:  Sicca symptoms   History of Present Illness: Alexandria Leach is a 73 y.o. female with history of sjogren's syndrome, osteoarthritis, and fibromyalgia.  She is not currently taking any immunosuppressive agents.  Patient continues to have chronic sicca symptoms.  She has been using Biotene oral rinse and Systane and restasis eye drops.  She has a humidifier at home but has not been using it yet this season.  She continues to see her ophthalmologist on a yearly basis and tries to see her dentist every 6 months.  Patient continues to have chronic myalgias and arthralgias due to underlying osteoarthritis and fibromyalgia.  She has been taking tramadol as needed for pain relief.  She has been out of her prescription for Ambien to help her sleep at night.  Patient states that she establish care with Dr. Melanee Leach at [facility] on 04/02/2023 at which time she had updated lab work.    Activities of Daily Living:  Patient reports morning stiffness for 5-6 hours.   Patient Reports nocturnal pain.  Difficulty dressing/grooming: Denies Difficulty climbing stairs: Reports Difficulty getting out of chair: Denies Difficulty using hands for taps, buttons, cutlery, and/or writing: Reports  Review of Systems  Constitutional:  Positive for fatigue.  HENT:  Positive for mouth dryness and nose dryness. Negative for mouth sores.   Eyes:  Positive for pain, visual disturbance and dryness.  Respiratory:  Positive for shortness of breath. Negative for difficulty breathing.        On exertion   Cardiovascular:  Negative for chest pain and palpitations.  Gastrointestinal:  Negative for blood in stool, constipation and diarrhea.  Endocrine: Negative for increased  urination.  Genitourinary:  Negative for involuntary urination.  Musculoskeletal:  Positive for joint pain, gait problem, joint pain, myalgias, muscle weakness, morning stiffness, muscle tenderness and myalgias. Negative for joint swelling.  Skin:  Negative for color change, rash, hair loss and sensitivity to sunlight.  Allergic/Immunologic: Negative for susceptible to infections.  Neurological:  Negative for dizziness and headaches.  Hematological:  Negative for swollen glands.  Psychiatric/Behavioral:  Positive for sleep disturbance. Negative for depressed mood. The patient is nervous/anxious.     PMFS History:  Patient Active Problem List   Diagnosis Date Noted   Myalgia due to statin 01/20/2023   Thrombophlebitis of left arm 12/18/2022   Leg pain, bilateral 01/11/2022   Chronic pain of right knee 07/11/2021   Chronic GERD 01/30/2021   History of adenomatous polyp of colon 01/30/2021   Vitamin B1 deficiency 01/18/2021   Low serum vitamin B12 01/13/2021   Secondary hypercoagulable state (HCC) 10/23/2020   Prediabetes 07/11/2020   Snoring 06/23/2020   Atrial flutter (HCC) 06/23/2020   Dysphagia 05/15/2020   Secondary hyperparathyroidism of renal origin (HCC) 12/28/2019   Constipation 12/28/2019   Sinus tachycardia 05/27/2018   Transient alteration of awareness 04/04/2018   Abnormal serum thyroid stimulating hormone (TSH) level 01/30/2018   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/22/2017   Thyroid nodule 09/16/2017   Sialoadenitis of submandibular gland 10/29/2016   High risk medication use 06/05/2016   Peripheral neuropathy 01/30/2016   DNR no code (do  not resuscitate) 12/08/2015   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   TIA (transient ischemic attack) 10/12/2015   Mild mitral regurgitation 10/12/2015   Osteopenia 09/18/2015   Right arm pain 08/29/2015   Right sided sciatica 02/21/2015   Imbalance 01/12/2015   Transaminitis 12/24/2014   DDD (degenerative  disc disease), cervical    Health maintenance examination 10/18/2014   Advanced care planning/counseling discussion 10/18/2014   Medicare annual wellness visit, subsequent 10/18/2014   Refusal of blood product    Sjogren's syndrome (HCC)    CKD (chronic kidney disease) stage 3, GFR 30-59 ml/min (HCC)    Glaucoma    Fibromyalgia    Osteoarthritis    History of pulmonary embolism    Lung nodule 09/22/2012   (HFpEF) heart failure with preserved ejection fraction (HCC) 12/04/2011   Aortic stenosis 04/22/2011   Palpitations 08/10/2010   HOT FLASHES 06/28/2008   GAD (generalized anxiety disorder) 03/28/2006   History of mitral valve repair 03/28/2006   History of total abdominal hysterectomy 03/28/2006   HYPERCHOLESTEROLEMIA 12/31/2005   MDD (major depressive disorder), recurrent episode (HCC) 12/31/2005   RHEUMATIC HEART DISEASE 12/31/2005   Essential hypertension 12/31/2005   Chronic insomnia 12/31/2005    Past Medical History:  Diagnosis Date   Ankle fracture, left 02/19/2015   from a fall   Anxiety    Cataract 2014   corrected with surgery   CHF (congestive heart failure) (HCC)    CKD (chronic kidney disease) stage 3, GFR 30-59 ml/min [facility])    saw nephrologist Dr Stephens Shire   Community acquired pneumonia 11/29/2022   Multifocal s/p hospitalization 11/2022     COVID-19 09/21/2021   DDD (degenerative disc disease), cervical    Depression    Fibromyalgia    Glaucoma    s/p surgery, sees ophtho Q6 mo   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 Thought benign.    Osteoarthritis    shoulders and knees, not RA per Dr Corliss Skains,  positive ANA, positive Ro   Osteopenia 09/18/2015   DEXA T -1.1 hip, -0.2 spine 08/2015    Personal history of urinary calculi latest 2014   Pneumonia 12/02/2011   PONV (postoperative nausea and vomiting)    Refusal of blood transfusions as patient is Jehovah's Witness    Rheumatic heart disease 1980   s/p mitral valve repair 1980   Sjogren's syndrome (HCC)    Trimalleolar fracture of left ankle 02/23/2015    Family History  Problem Relation Age of Onset   Cancer Mother        lung (nonsmoker)   CAD Mother        MI in her 78s   ALS Mother    Kidney disease Father    Alcohol abuse Father    Diabetes Father    Lupus Sister        and niece   Diabetes Sister    Stroke Sister    Depression Sister    Lupus Sister    Blindness Sister    Cancer Maternal Uncle        bone  Stroke Maternal Grandmother    Cancer Brother        bone   Diabetes Brother    Heart attack Brother    Healthy Son    Healthy Son    Healthy Son    Healthy Son    Kidney failure Other        on HD   Breast cancer Neg Hx    Past Surgical History:  Procedure Laterality Date   BREAST BIOPSY Right 2006   benign   CARDIOVERSION N/A 07/18/2020   Procedure: CARDIOVERSION;  Surgeon: Parke Poisson, MD;  Location: [facility] ENDOSCOPY;  Service: Cardiovascular;  Laterality: N/A;   CHOLECYSTECTOMY  11/27/2011   Procedure: LAPAROSCOPIC CHOLECYSTECTOMY WITH INTRAOPERATIVE CHOLANGIOGRAM;  Surgeon: Ernestene Mention, MD;  Location: [facility] OR;  Service: General;  Laterality: N/A;  laparoscopic cholecystectomy with choleangiogram umbilical hernia repair   COLONOSCOPY  07/2014   WNL Madilyn Fireman)   COLONOSCOPY  08/2019   3 TAs, rpt 3 yrs ([place])   dexa  08/2012   normal per patient - no records available   ESOPHAGOGASTRODUODENOSCOPY  08/2019   reactive gastropathy, neg H pylori ([place]t)   EYE SURGERY Bilateral 2014   cataract removal   MITRAL VALVE REPAIR  1980   open heart   ORIF ANKLE FRACTURE Left 02/26/2015    Procedure: OPEN REDUCTION INTERNAL FIXATION (ORIF) LEFT TRIMALLEOLAR ANKLE FRACTURE;  Surgeon: Tarry Kos, MD;  Location: [facility] OR;  Service: Orthopedics;  Laterality: Left;   TUBAL LIGATION  1980   UMBILICAL HERNIA REPAIR  11/27/2011   Procedure: HERNIA REPAIR UMBILICAL ADULT;  Surgeon: Ernestene Mention, MD;  Location: [facility] OR;  Service: General;  Laterality: N/A;   VAGINAL HYSTERECTOMY  1992   for fibroids -- partial, ovaries remain   Social History   Social History Narrative   Lives with son, 1 dog   Occupation: unemployed, on disability for fibromyalgia since 2008.   Edu: HS   Religion: Jehova's witness   Activity: volunteers at senior center   Diet: some water, fruits/vegetables daily   No caffeine use   Immunization History  Administered Date(s) Administered   PFIZER(Purple Top)SARS-COV-2 Vaccination 05/12/2019, 06/09/2019, 01/06/2020   Pfizer Covid-19 Vaccine Bivalent Booster 61yrs & up 02/13/2021   Pfizer(Comirnaty)Fall Seasonal Vaccine 12 years and older 12/25/2021, 12/19/2022   Pneumococcal Conjugate-13 12/15/2017   Pneumococcal Polysaccharide-23 09/15/2012, 12/22/2018     Objective: Vital Signs: BP 129/81 (BP Location: Left Arm, Patient Position: Sitting, Cuff Size: Normal)   Pulse 76   Resp 14   Ht 5\' 1"  (1.549 m)   Wt 196 lb 12.8 oz (89.3 kg)   BMI 37.19 kg/m    Physical Exam Vitals and nursing note reviewed.  Constitutional:      Appearance: She is well-developed.  HENT:     Head: Normocephalic and atraumatic.  Eyes:     Conjunctiva/sclera: Conjunctivae normal.  Cardiovascular:     Rate and Rhythm: Normal rate and regular rhythm.     Heart sounds: Normal heart sounds.  Pulmonary:     Effort: Pulmonary effort is normal.     Breath sounds: Normal breath sounds.  Abdominal:     General: Bowel sounds are normal.     Palpations: Abdomen is soft.  Musculoskeletal:     Cervical back: Normal range of motion.  Lymphadenopathy:     Cervical: No cervical  adenopathy.  Skin:    General: Skin is warm and dry.     Capillary Refill: Capillary refill takes  less than 2 seconds.  Neurological:     Mental Status: She is alert and oriented to person, place, and time.  Psychiatric:        Behavior: Behavior normal.      Musculoskeletal Exam: Generalized hyperalgesia and positive tender points noted. C-spine, thoracic spine, and lumbar spine have good ROM.  Painful ROM of both shoulders.  elbow joints, wrist joints, MCPs, PIPs, and DIPs good ROM with no synovitis.  PIP and DIP thickening.  Complete fist formation bilaterally.  Hip joints have good ROM with no groin pain.  Knee joints have good ROM with no warmth or effusion.  Ankle joints have good ROM with no tenderness or swelling.    CDAI Exam: CDAI Score: -- Patient Global: --; Provider Global: -- Swollen: --; Tender: -- Joint Exam 04/14/2023   No joint exam has been documented for this visit   There is currently no information documented on the homunculus. Go to the Rheumatology activity and complete the homunculus joint exam.  Investigation: No additional findings.  Imaging: No results found.  Recent Labs: Lab Results  Component Value Date   WBC 4.9 01/07/2023   HGB 11.9 (L) 01/07/2023   PLT 266.0 01/07/2023   NA 140 02/03/2023   K 4.2 02/03/2023   CL 107 02/03/2023   CO2 23 02/03/2023   GLUCOSE 90 02/03/2023   BUN 15 02/03/2023   CREATININE 1.62 (H) 02/03/2023   BILITOT 0.4 01/07/2023   ALKPHOS 56 01/07/2023   AST 18 01/07/2023   ALT 9 01/07/2023   PROT 7.1 01/07/2023   ALBUMIN 4.2 02/03/2023   CALCIUM 9.9 02/03/2023   GFRAA 51 (L) 11/11/2019    Speciality Comments: PLQ Eye Exam: 08/03/2018 WNL @ [facility] Follow up in 1 year  Procedures:  No procedures performed Allergies: Cymbalta [duloxetine hcl], Statins, and Sulfa antibiotics     Assessment / Plan:     Visit Diagnoses: Sjogren's syndrome with keratoconjunctivitis sicca (HCC) - +ANA, +Ro: She is not  currently taking immunosuppressive agents.  She discontinued Plaquenil in February 2021: Patient continues to have chronic sicca symptoms.  She has been using Biotene products, Systane, and restasis for symptomatic relief.  She continues to see the ophthalmologist on yearly basis and tries to see the dentist every 6 months.  She has a Environmental health practitioner at home but has not been using it recently.  Discussed increased risk for developing lymphoma in patients with Sjogren's syndrome.  Lab work from 04/02/2023 was reviewed today in the office: No proteinuria, creatinine 1.40, GFR 40, AST and ALT within normal limits, magnesium 2.0 within normal limits, phosphorus 3.2 within normal limits, CBC with differential within normal limits, iron panel within normal limits, PTH within normal limits, urine protein creatinine ratio within normal limits, SPEP normal, C3 144, C4 51, Ro antibody positive,and la antibody negative.   She does not require immunosuppressive therapy at this time.  She was advised to notify us if she develops any new or worsening symptoms.  She will follow-up in the office in 5 months or sooner if needed.   High risk medication use - Discontinued PLQ in Feb 2021.  She is not taking any immunosuppressive agents at this time.   Primary osteoarthritis of both hands: PIP and DIP thickening consistent with osteoarthritis of both hands.  No synovitis noted.   Primary osteoarthritis of right knee - X-rays from April 2023 which were consistent with osteoarthritis, right knee joint injection in April by Dr. Dallas Schimke.  No  warmth or effusion noted.  Pain in right ankle and joints of right foot: No tenderness or synovitis noted.  DDD (degenerative disc disease), cervical: C-spine has good range of motion on examination today.  She continues to have some trapezius muscle tension and tenderness bilaterally.  Degeneration of intervertebral disc of lumbar region without discogenic back pain or lower extremity pain:  Patient continues to experience intermittent discomfort and stiffness in her lower back.  No symptoms of radiculopathy at this time.  Osteopenia of multiple sites - Jul 16, 2021 repeat DEXA scan which showed T score of -1.1 and BMD 0.880 in the left femoral neck.  She is taking vitamin D 1000 units daily. Due to update DEXA in May 2025.  Fibromyalgia - She has generalized hyperalgesia and positive tender points on exam.  Patient continues to experience intermittent myalgias and arthralgias especially with the colder weather temperatures.  She takes tramadol 50 mg 1 tablet daily as needed for pain relief.  She was also prescribed Ambien to help her sleep at night.  Discussed the importance of regular exercise and good sleep hygiene.  Other chronic pain -She takes tramadol 50 mg 1 tablet daily as needed for pain relief.  UDS and narcotic agreement were updated today.  Plan: DRUG MONITOR, TRAMADOL,QN, URINE, DRUG MONITOR, PANEL 5, W/CONF, URINE  Medication monitoring encounter -UDS and narcotic agreement were updated today.  Plan: DRUG MONITOR, TRAMADOL,QN, URINE, DRUG MONITOR, PANEL 5, W/CONF, URINE  Other fatigue: Chronic, stable.  Other insomnia: Patient takes Ambien 10 mg at bedtime as needed for insomnia.  Other medical conditions are listed as follows:  History of hypertension: Blood pressure was 129/81 today in the office.  Stage 3a chronic kidney disease [facility]): Evaluated by Dr. Valentino Nose on 04/02/23--reviewed office visit note and lab work.  Mildly progressive CKD with no proteinuria.  Historically no signs that Sjogren's syndrome is affecting kidneys from a GYN perspective.  Typical atrial flutter [facility]): Under the care of cardiology.  History of hypercholesterolemia  History of pulmonary embolism: She remains on Eliquis as prescribed.  Chronic anticoagulation: She remains on Eliquis as prescribed.  Thyroid nodule  History of anxiety  Orders: Orders Placed This Encounter   Procedures   DRUG MONITOR, TRAMADOL,QN, URINE   DRUG MONITOR, PANEL 5, W/CONF, URINE   No orders of the defined types were placed in this encounter.   Follow-Up Instructions: Return in about 5 months (around 09/12/2023) for Sjogren's syndrome, Osteoarthritis, Fibromyalgia.   Gearldine Bienenstock, PA-C  Note - This record has been created using Dragon software.  Chart creation errors have been sought, but may not always  have been located. Such creation errors do not reflect on  the standard of medical care.

## 2023-04-02 DIAGNOSIS — I129 Hypertensive chronic kidney disease with stage 1 through stage 4 chronic kidney disease, or unspecified chronic kidney disease: Secondary | ICD-10-CM | POA: Diagnosis not present

## 2023-04-02 DIAGNOSIS — Z87442 Personal history of urinary calculi: Secondary | ICD-10-CM | POA: Diagnosis not present

## 2023-04-02 DIAGNOSIS — E785 Hyperlipidemia, unspecified: Secondary | ICD-10-CM | POA: Diagnosis not present

## 2023-04-02 DIAGNOSIS — I4891 Unspecified atrial fibrillation: Secondary | ICD-10-CM | POA: Diagnosis not present

## 2023-04-02 DIAGNOSIS — N1832 Chronic kidney disease, stage 3b: Secondary | ICD-10-CM | POA: Diagnosis not present

## 2023-04-02 DIAGNOSIS — M3500 Sicca syndrome, unspecified: Secondary | ICD-10-CM | POA: Diagnosis not present

## 2023-04-04 ENCOUNTER — Other Ambulatory Visit: Payer: Self-pay | Admitting: Rheumatology

## 2023-04-04 NOTE — Telephone Encounter (Signed)
Last Fill: 05/21/2022  UDS:10/11/2022 UDS consistent with tramadol use.   Narc Agreement: 10/11/2022  Next Visit: 04/14/2023  Last Visit: 10/11/2022  Dx: Other chronic pain   Current Dose per office note on 10/11/2022:Tramadol as needed for pain relief.    Okay to refill Tramadol?

## 2023-04-07 ENCOUNTER — Telehealth (HOSPITAL_COMMUNITY): Payer: Self-pay | Admitting: *Deleted

## 2023-04-07 ENCOUNTER — Ambulatory Visit: Payer: Self-pay

## 2023-04-07 ENCOUNTER — Other Ambulatory Visit (HOSPITAL_COMMUNITY): Payer: Self-pay | Admitting: Psychiatry

## 2023-04-07 MED ORDER — ZOLPIDEM TARTRATE 10 MG PO TABS: 10.0000 mg | ORAL_TABLET | Freq: Every evening | ORAL | 2 refills | Status: DC | PRN

## 2023-04-07 NOTE — Telephone Encounter (Signed)
sent 

## 2023-04-07 NOTE — Telephone Encounter (Signed)
Patient pharmacy is wanting provider to please send in correct quantity for mail order for patient Zolpidem 10mg . Current quantity is 30 tabs. Patient pharmacy is [facility]

## 2023-04-07 NOTE — Patient Outreach (Signed)
  Care Coordination   Follow Up Visit Note   04/07/2023 Name: Alexandria Leach MRN: [id number] DOB: 1950-07-31  Alexandria Leach is a 72 y.o. year old female who sees Eustaquio Boyden, MD for primary care. I spoke with  Oren Binet Hirschman by phone today.  What matters to the patients health and wellness today?  Patient reports having recent follow up with nephrologist due to elevated creatnine level. Patient states she was advised that elevated creatnine level may be due to her Sjogrens condition.  Patient states she had lab work done at this most recent nephrology visit and will be called if lab by office/ provider if lab work concerning. Patient states she continues to experience fatigue due to fibromyalgia  and Sjogrens. She states some days are better than others. She states she pushes herself to get up and going on the not so good days. Patient reports ongoing follow up with her psychiatrist. Patient states anxiety is her main issue when managing these conditions. Patient denies any heart failure symptoms.      Goals Addressed             This Visit's Progress    Ongoing improvement post hospitalization for pneumonia and management/ education of health conditions       Interventions Today    Flowsheet Row Most Recent Value  Chronic Disease   Chronic disease during today's visit Congestive Heart Failure (CHF), Other  [sjogrens, fibromyalgia, anxiety]  General Interventions   General Interventions Discussed/Reviewed General Interventions Reviewed, Doctor Visits, Labs  [place] of current treatment plan for listed health conditions and patients adherence to plan as established by providers. Assessed for HF, sjogrens, fibromyalgia symptoms.]  Labs Kidney Function  [Advised to contact nephrologist regarding lab results or concerns.]  Doctor Visits Discussed/Reviewed Doctor Visits Reviewed  [confirmed patient has follow up appointment with rheumatologist.  Advised to discuss  any concerns regarding worsening / ongoing symptoms related to sjogrens/ fibromyalgia conditions. Advisded to keep follow up visits with providers as recommended.]  Education Interventions   Education Provided Provided Education  [Advised to stay as physically active as possible. Advised to stay mentally healthy by following up with your psychiatrist regularly and taking prescribed medications.]  Mental Health Interventions   Mental Health Discussed/Reviewed Mental Health Reviewed, Coping Strategies, Anxiety  [Discussed coping strategies for anxiety and ways to manage fatigue]  Pharmacy Interventions   Pharmacy Dicussed/Reviewed Pharmacy Topics Reviewed  [medications reviewed and compliance discussed.]                SDOH assessments and interventions completed:  No     Care Coordination Interventions:  Yes, provided   Follow up plan: Follow up call scheduled for 06/03/23    Encounter Outcome:  Patient Visit Completed   [name] RN, BSN, CCM [place]  [facility], Population Health Case Manager Phone: [phone]

## 2023-04-09 LAB — LAB REPORT - SCANNED
Albumin, Urine POC: 6.5
EGFR: 40
Microalb Creat Ratio: 8

## 2023-04-14 ENCOUNTER — Encounter: Payer: Self-pay | Admitting: Physician Assistant

## 2023-04-14 ENCOUNTER — Ambulatory Visit: Payer: 59 | Attending: Physician Assistant | Admitting: Physician Assistant

## 2023-04-14 VITALS — BP 129/81 | HR 76 | Resp 14 | Ht 61.0 in | Wt 196.8 lb

## 2023-04-14 DIAGNOSIS — M19041 Primary osteoarthritis, right hand: Secondary | ICD-10-CM

## 2023-04-14 DIAGNOSIS — M797 Fibromyalgia: Secondary | ICD-10-CM

## 2023-04-14 DIAGNOSIS — Z7901 Long term (current) use of anticoagulants: Secondary | ICD-10-CM

## 2023-04-14 DIAGNOSIS — M1711 Unilateral primary osteoarthritis, right knee: Secondary | ICD-10-CM

## 2023-04-14 DIAGNOSIS — G8929 Other chronic pain: Secondary | ICD-10-CM

## 2023-04-14 DIAGNOSIS — E041 Nontoxic single thyroid nodule: Secondary | ICD-10-CM

## 2023-04-14 DIAGNOSIS — G4709 Other insomnia: Secondary | ICD-10-CM

## 2023-04-14 DIAGNOSIS — M8589 Other specified disorders of bone density and structure, multiple sites: Secondary | ICD-10-CM

## 2023-04-14 DIAGNOSIS — M5030 Other cervical disc degeneration, unspecified cervical region: Secondary | ICD-10-CM | POA: Diagnosis not present

## 2023-04-14 DIAGNOSIS — Z8639 Personal history of other endocrine, nutritional and metabolic disease: Secondary | ICD-10-CM

## 2023-04-14 DIAGNOSIS — Z86711 Personal history of pulmonary embolism: Secondary | ICD-10-CM

## 2023-04-14 DIAGNOSIS — Z5181 Encounter for therapeutic drug level monitoring: Secondary | ICD-10-CM

## 2023-04-14 DIAGNOSIS — M3501 Sicca syndrome with keratoconjunctivitis: Secondary | ICD-10-CM | POA: Diagnosis not present

## 2023-04-14 DIAGNOSIS — M19042 Primary osteoarthritis, left hand: Secondary | ICD-10-CM

## 2023-04-14 DIAGNOSIS — R5383 Other fatigue: Secondary | ICD-10-CM | POA: Diagnosis not present

## 2023-04-14 DIAGNOSIS — M25571 Pain in right ankle and joints of right foot: Secondary | ICD-10-CM | POA: Diagnosis not present

## 2023-04-14 DIAGNOSIS — M51369 Other intervertebral disc degeneration, lumbar region without mention of lumbar back pain or lower extremity pain: Secondary | ICD-10-CM

## 2023-04-14 DIAGNOSIS — Z79899 Other long term (current) drug therapy: Secondary | ICD-10-CM | POA: Diagnosis not present

## 2023-04-14 DIAGNOSIS — Z8659 Personal history of other mental and behavioral disorders: Secondary | ICD-10-CM

## 2023-04-14 DIAGNOSIS — N1831 Chronic kidney disease, stage 3a: Secondary | ICD-10-CM

## 2023-04-14 DIAGNOSIS — I483 Typical atrial flutter: Secondary | ICD-10-CM

## 2023-04-14 DIAGNOSIS — Z8679 Personal history of other diseases of the circulatory system: Secondary | ICD-10-CM

## 2023-04-16 LAB — DM TEMPLATE

## 2023-04-17 LAB — DM TEMPLATE

## 2023-04-17 LAB — DRUG MONITOR, PANEL 5, W/CONF, URINE
Alphahydroxyalprazolam: 828 ng/mL — ABNORMAL HIGH (ref <25)
Alphahydroxymidazolam: NEGATIVE ng/mL (ref <50)
Alphahydroxytriazolam: NEGATIVE ng/mL (ref <50)
Aminoclonazepam: NEGATIVE ng/mL (ref <25)
Amphetamines: NEGATIVE ng/mL (ref <500)
Barbiturates: NEGATIVE ng/mL (ref <300)
Benzodiazepines: POSITIVE ng/mL — AB (ref <100)
Cocaine Metabolite: NEGATIVE ng/mL (ref <150)
Creatinine: 188.1 mg/dL (ref >=20.0)
Hydroxyethylflurazepam: NEGATIVE ng/mL (ref <50)
Lorazepam: NEGATIVE ng/mL (ref <50)
Marijuana Metabolite: NEGATIVE ng/mL (ref <20)
Methadone Metabolite: NEGATIVE ng/mL (ref <100)
Nordiazepam: NEGATIVE ng/mL (ref <50)
Opiates: NEGATIVE ng/mL (ref <100)
Oxazepam: NEGATIVE ng/mL (ref <50)
Oxidant: NEGATIVE ug/mL (ref <200)
Oxycodone: NEGATIVE ng/mL (ref <100)
Temazepam: NEGATIVE ng/mL (ref <50)
pH: 7.2 (ref 4.5–9.0)

## 2023-04-17 LAB — DRUG MONITOR, TRAMADOL,QN, URINE
Desmethyltramadol: 598 ng/mL — ABNORMAL HIGH (ref <100)
Tramadol: 1821 ng/mL — ABNORMAL HIGH (ref <100)

## 2023-04-17 NOTE — Progress Notes (Signed)
UDS consistent with treatment

## 2023-04-28 NOTE — Telephone Encounter (Signed)
 Please see if a PA is needed. I filled out paperwork last week I believe

## 2023-05-20 ENCOUNTER — Telehealth (INDEPENDENT_AMBULATORY_CARE_PROVIDER_SITE_OTHER): Payer: 59 | Admitting: Psychiatry

## 2023-05-20 ENCOUNTER — Encounter (HOSPITAL_COMMUNITY): Payer: Self-pay | Admitting: Psychiatry

## 2023-05-20 DIAGNOSIS — F411 Generalized anxiety disorder: Secondary | ICD-10-CM | POA: Diagnosis not present

## 2023-05-20 MED ORDER — ALPRAZOLAM 0.5 MG PO TABS: 0.5000 mg | ORAL_TABLET | Freq: Three times a day (TID) | ORAL | 2 refills | Status: DC | PRN

## 2023-05-20 MED ORDER — TRAZODONE HCL 50 MG PO TABS: 50.0000 mg | ORAL_TABLET | Freq: Every day | ORAL | 2 refills | Status: DC

## 2023-05-20 NOTE — Progress Notes (Signed)
 Virtual Visit via Video Note  I connected with Alexandria Leach on 05/20/23 at  9:00 AM EST by a video enabled telemedicine application and verified that I am speaking with the correct person using two identifiers.  Location: Patient: home Provider: office   I discussed the limitations of evaluation and management by telemedicine and the availability of in person appointments. The patient expressed understanding and agreed to proceed.     I discussed the assessment and treatment plan with the patient. The patient was provided an opportunity to ask questions and all were answered. The patient agreed with the plan and demonstrated an understanding of the instructions.   The patient was advised to call back or seek an in-person evaluation if the symptoms worsen or if the condition fails to improve as anticipated.  I provided 20 minutes of non-face-to-face time during this encounter.   [name], MD  [facility] MD/PA/NP OP Progress Note  05/20/2023 9:28 AM Alexandria Leach  MRN:  [id number]  Chief Complaint:  Chief Complaint  Patient presents with   Anxiety   Follow-up   HPI: This patient is a 73 year old separated black female lives alone in [place]. 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 She has 4 sons.   The patient returns for follow-up after 3 months regarding her anxiety and insomnia.  She is having a lot of joint pain and muscle aches.  The Ambien is not helping her sleep and she is often very tired through the day.  I suggested a switch to trazodone and she is in agreement.  The Xanax helps her anxiety but she often only uses it once a day.  I explained that she could use it if she wakes up through the night if she has been doing.  She recently saw rheumatology but no changes were made in her regimen.  She had been on Plaquenil in the past but it did not help.  She does take tramadol for pain which  only marginally helps.  Nevertheless the patient denies depression and states her anxiety is under good control.  She is trying to stay fairly busy and active. Visit Diagnosis:    ICD-10-CM   1. GAD (generalized anxiety disorder)  F41.1       Past Psychiatric History: none  Past Medical History:  Past Medical History:  Diagnosis Date   Ankle fracture, left 02/19/2015   from a fall   Anxiety    Cataract 2014   corrected with surgery   CHF (congestive heart failure) (HCC)    CKD (chronic kidney disease) stage 3, GFR 30-59 ml/min [facility])    saw nephrologist Dr Stephens Shire   Community acquired pneumonia 11/29/2022   Multifocal s/p hospitalization 11/2022     COVID-19 09/21/2021   DDD (degenerative disc disease), cervical    Depression    Fibromyalgia    Glaucoma    s/p surgery, sees ophtho Q6 mo   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 Thought benign.    Osteoarthritis  shoulders and knees, not RA per Dr Corliss Skains, positive ANA, positive Ro   Osteopenia 09/18/2015   DEXA T -1.1 hip, -0.2 spine 08/2015    Personal history of urinary calculi latest 2014   Pneumonia 12/02/2011   PONV (postoperative nausea and vomiting)    Refusal of blood transfusions as patient is Jehovah's Witness    Rheumatic heart disease 1980   s/p mitral valve repair 1980   Sjogren's syndrome (HCC)    Trimalleolar fracture of left ankle 02/23/2015    Past Surgical History:  Procedure Laterality Date   BREAST BIOPSY Right 2006   benign   CARDIOVERSION N/A 07/18/2020   Procedure: CARDIOVERSION;  Surgeon: Parke Poisson, MD;  [facility] ENDOSCOPY;   Service: Cardiovascular;  Laterality: N/A;   CHOLECYSTECTOMY  11/27/2011   Procedure: LAPAROSCOPIC CHOLECYSTECTOMY WITH INTRAOPERATIVE CHOLANGIOGRAM;  Surgeon: Ernestene Mention, MD;  Location: [facility] OR;  Service: General;  Laterality: N/A;  laparoscopic cholecystectomy with choleangiogram umbilical hernia repair   COLONOSCOPY  07/2014   WNL Madilyn Fireman)   COLONOSCOPY  08/2019   3 TAs, rpt 3 yrs ([place])   dexa  08/2012   normal per patient - no records available   ESOPHAGOGASTRODUODENOSCOPY  08/2019   reactive gastropathy, neg H pylori ([place])   EYE SURGERY Bilateral 2014   cataract removal   MITRAL VALVE REPAIR  1980   open heart   ORIF ANKLE FRACTURE Left 02/26/2015   Procedure: OPEN REDUCTION INTERNAL FIXATION (ORIF) LEFT TRIMALLEOLAR ANKLE FRACTURE;  Surgeon: Tarry Kos, MD;  Location: [facility] OR;  Service: Orthopedics;  Laterality: Left;   TUBAL LIGATION  1980   UMBILICAL HERNIA REPAIR  11/27/2011   Procedure: HERNIA REPAIR UMBILICAL ADULT;  Surgeon: Ernestene Mention, MD;  Location[facility] OR;  Service: General;  Laterality: N/A;   VAGINAL HYSTERECTOMY  1992   for fibroids -- partial, ovaries remain    Family Psychiatric History: See below  Family History:  Family History  Problem Relation Age of Onset   Cancer Mother        lung (nonsmoker)   CAD Mother        MI in her 81s   ALS Mother    Kidney disease Father    Alcohol abuse Father    Diabetes Father    Lupus Sister        and niece   Diabetes Sister    Stroke Sister    Depression Sister    Lupus Sister    Blindness Sister    Cancer Maternal Uncle        bone   Stroke Maternal Grandmother    Cancer Brother        bone   Diabetes Brother    Heart attack Brother    Healthy Son    Healthy Son    Healthy Son    Healthy Son    Kidney failure Other        on HD   Breast cancer Neg Hx     Social History:  Social History   Socioeconomic History   Marital status: Married    Spouse name: [name]   Number of  children: 4   Years of education: 12   Highest education level: 12th grade  Occupational History    Employer: UNEMPLOYED    Comment: Disability  Tobacco Use   Smoking status: Never    Passive exposure: Past   Smokeless tobacco: Never   Tobacco comments:    Never smoke 08/29/21  Vaping Use   Vaping status: Never Used  Substance and Sexual Activity   Alcohol use: No    Alcohol/week: 0.0 standard drinks of alcohol   Drug use: No   Sexual activity: Not Currently    Birth control/protection: Surgical  Other Topics Concern   Not on file  Social History Narrative   Lives with son, 1 dog   Occupation: unemployed, on disability for fibromyalgia since 2008.   Edu: HS   Religion: Jehova's witness   Activity: volunteers at senior center   Diet: some water, fruits/vegetables daily   No caffeine use   Social Drivers of Corporate investment banker Strain: Low Risk  (01/16/2023)   Overall Financial Resource Strain (CARDIA)    Difficulty of Paying Living Expenses: Not hard at all  Food Insecurity: No Food Insecurity (02/19/2023)   Hunger Vital Sign    Worried About Running Out of Food in the Last Year: Never true    Ran Out of Food in the Last Year: Never true  Transportation Needs: No Transportation Needs (02/19/2023)   PRAPARE - Administrator, Civil Service (Medical): No    Lack of Transportation (Non-Medical): No  Physical Activity: Insufficiently Active (01/16/2023)   Exercise Vital Sign    Days of Exercise per Week: 3 days    Minutes of Exercise per Session: 30 min  Stress: No Stress Concern Present (01/16/2023)   [employer] of Occupational Health - Occupational Stress Questionnaire    Feeling of Stress : Not at all  Social Connections: Socially Integrated (01/16/2023)   Social Connection and Isolation Panel [NHANES]    Frequency of Communication with Friends and Family: More than three times a week    Frequency of Social Gatherings with Friends and Family:  Three times a week    Attends Religious Services: More than 4 times per year    Active Member of Clubs or Organizations: Yes    Attends Banker Meetings: More than 4 times per year    Marital Status: Married    Allergies:  Allergies  Allergen Reactions   Cymbalta [Duloxetine Hcl] Other (See Comments)    tachycardia   Statins Nausea Only and Other (See Comments)    Muscle cramps also   Sulfa Antibiotics Nausea And Vomiting    Metabolic Disorder Labs: Lab Results  Component Value Date   HGBA1C 6.1 01/07/2023   MPG 126 01/12/2021   MPG 114 10/13/2015   No results found for: "PROLACTIN" Lab Results  Component Value Date   CHOL 216 (H) 01/07/2023   TRIG 91.0 01/07/2023   HDL 61.20 01/07/2023   CHOLHDL 4 01/07/2023   VLDL 18.2 01/07/2023   LDLCALC 137 (H) 01/07/2023   LDLCALC 112 (H) 12/25/2021   Lab Results  Component Value Date   TSH 4.00 01/07/2023   TSH 2.00 01/04/2022    Therapeutic Level Labs: No results found for: "LITHIUM" No results found for: "VALPROATE" No results found for: "CBMZ"  Current Medications: Current Outpatient Medications  Medication Sig Dispense Refill   traZODone (DESYREL) 50 MG tablet Take 1 tablet (50 mg total) by mouth at bedtime. 90 tablet 2   ALPRAZolam (XANAX) 0.5 MG tablet Take 1 tablet (0.5 mg total) by mouth 3 (three) times daily as needed for anxiety. 90 tablet 2   antiseptic oral rinse (BIOTENE) LIQD 15 mLs by Mouth Rinse route 2 (two) times daily as needed for dry mouth.     apixaban (ELIQUIS) 5 MG TABS tablet  Take 1 tablet (5 mg total) by mouth 2 (two) times daily. 100 day supply 200 tablet 1   cholecalciferol (VITAMIN D) 1000 UNITS tablet Take 1,000 Units by mouth daily.     cyanocobalamin (VITAMIN B12) 1000 MCG tablet Take 1 tablet (1,000 mcg total) by mouth once a week.     diltiazem (CARDIZEM CD) 360 MG 24 hr capsule TAKE 1 CAPSULE BY MOUTH DAILY 90 capsule 3   docusate sodium (COLACE) 100 MG capsule Take 100 mg  by mouth daily.     dofetilide (TIKOSYN) 250 MCG capsule Take 1 capsule (250 mcg total) by mouth 2 (two) times daily. 180 capsule 2   ezetimibe (ZETIA) 10 MG tablet Take 1 tablet (10 mg total) by mouth at bedtime. 100 tablet 3   furosemide (LASIX) 20 MG tablet Take 2 tablets (40 mg total) by mouth daily. 60 tablet 0   gabapentin (NEURONTIN) 300 MG capsule TAKE 2 CAPSULES BY MOUTH IN THE MORNING 1 CAPSULE BY  MOUTH IN THE AFTERNOON AND  2 CAPSULES BY MOUTH AT  BEDTIME 500 capsule 3   loratadine (CLARITIN) 10 MG tablet Take 1 tablet (10 mg total) by mouth daily. 90 tablet 3   losartan (COZAAR) 100 MG tablet Take 0.5 tablets (50 mg total) by mouth daily. 50 tablet 3   magnesium oxide (MAG-OX) 400 (240 Mg) MG tablet Take 1 tablet (400 mg total) by mouth daily.     metoprolol tartrate (LOPRESSOR) 50 MG tablet TAKE 1 AND 1/2 TABLETS BY MOUTH  TWICE DAILY 300 tablet 2   pantoprazole (PROTONIX) 40 MG tablet Take 1 tablet (40 mg total) by mouth daily. 100 tablet 3   Polyethyl Glycol-Propyl Glycol (SYSTANE) 0.4-0.3 % GEL ophthalmic gel Place 1 drop into both eyes 2 (two) times daily.      potassium chloride (KLOR-CON M) 10 MEQ tablet TAKE 1 TABLET BY MOUTH TWICE  DAILY 200 tablet 2   RESTASIS 0.05 % ophthalmic emulsion INSTILL ONE DROP IN BOTH  EYES TWO TIMES DAILY 180 each 0   thiamine (VITAMIN B1) 100 MG tablet Take 1 tablet (100 mg total) by mouth once a week.     traMADol (ULTRAM) 50 MG tablet TAKE 1 TABLET BY MOUTH DAILY AS  NEEDED 30 tablet 0   No current facility-administered medications for this visit.     Musculoskeletal: Strength & Muscle Tone: within normal limits Gait & Station: normal Patient leans: N/A  Psychiatric Specialty Exam: Review of Systems  Constitutional:  Positive for fatigue.  Musculoskeletal:  Positive for arthralgias, joint swelling and myalgias.  Psychiatric/Behavioral:  Positive for sleep disturbance.   All other systems reviewed and are negative.   There were no  vitals taken for this visit.There is no height or weight on file to calculate BMI.  General Appearance: Casual and Fairly Groomed  Eye Contact:  Good  Speech:  Clear and Coherent  Volume:  Normal  Mood:  Euthymic  Affect:  Flat  Thought Process:  Goal Directed  Orientation:  Full (Time, Place, and Person)  Thought Content: WDL   Suicidal Thoughts:  No  Homicidal Thoughts:  No  Memory:  Immediate;   Good Recent;   Good Remote;   Good  Judgement:  Good  Insight:  Fair  Psychomotor Activity:  Decreased  Concentration:  Concentration: Good and Attention Span: Good  Recall:  Good  Fund of Knowledge: Good  Language: Good  Akathisia:  No  Handed:  Right  AIMS (if indicated): not done  Assets:  Communication Skills Desire for Improvement Resilience Social Support  ADL's:  Intact  Cognition: WNL  Sleep:  Poor   Screenings: GAD-7    Garment/textile technologist Visit from [facility]20/20[facility] Visit from 12/18/2022 in [facility] Visit from 07/15/2022 in [facility] Office Visit from 01/11/2022 in [facility] Office Visit from 12/28/2019 in [facility]  Total GAD-7 Score 0 0 0 1 0      Mini-Mental    Flowsheet Row Clinical Support from 12/11/2017 in [facility] Clinical Support from 12/09/2016 in [facility] Clinical Support from 11/30/2015 in [facility]  Total Score (max 30 points ) 20 19 18       PHQ2-9    Flowsheet Row Office Visit from 01/20/2023 in [facility] Office Visit from 12/18/2022 in [facility] Office Visit from 07/15/2022 in [facility] Office Visit from 01/11/2022 in [facility] Video Visit from 11/21/2021 in [facility] Total Score 0 0 0 1 0  PHQ-9 Total Score 2 0 3 4 --      Flowsheet Row ED to Hosp-Admission (Discharged) from 11/29/2022 in [facility] 3E HF PCU ED from 02/09/2022 in [facility] Video Visit from 11/21/2021 in [facility] at [place]  C-SSRS RISK CATEGORY No Risk No Risk No Risk        Assessment and Plan: This patient is a 73 year old female with a history of depression anxiety congestive heart failure Sjogren's syndrome.  She denies depression and her anxiety seems fairly well-controlled.  However she is not sleeping well and has already tried Restoril and Ambien.  We will try trazodone 50 mg at bedtime.  She will continue Xanax 0.5 mg up to 3 times daily for anxiety.  She will return to see me in 3 months  Collaboration of Care: Collaboration of Care: Primary Care Provider AEB notes are shared with PCP on the epic system  Patient/Guardian was advised Release of Information must be obtained prior to any record release in order to collaborate their care with an outside provider. Patient/Guardian was advised if they have not already done so to contact the registration department to sign all necessary forms in order for [place] to release information regarding their care.   Consent: Patient/Guardian gives verbal consent for treatment and assignment of benefits for services provided during this visit. Patient/Guardian expressed understanding and agreed to proceed.    [name], MD 05/20/2023, 9:28 AM

## 2023-05-28 ENCOUNTER — Telehealth (HOSPITAL_COMMUNITY): Payer: Self-pay

## 2023-05-28 ENCOUNTER — Telehealth: Payer: Self-pay | Admitting: *Deleted

## 2023-05-28 NOTE — Telephone Encounter (Signed)
 Pt called in stating that since starting the traZODone (DESYREL) 50 MG tablet she has not been able to sleep, she states that she is wanting her ambien back. Pt last seen 05/20/23. Please advise.

## 2023-05-28 NOTE — Progress Notes (Signed)
 Complex Care Management Care Guide Note  05/28/2023 Name: Alexandria Leach MRN: [id number] DOB: 1950-09-01  Alexandria Leach is a 73 y.o. year old female who is a primary care patient of Alexandria Boyden, MD and is actively engaged with the care management team. I reached out to Alexandria Leach by phone today to assist with re-scheduling  with the RN Case Manager.  Follow up plan: Unsuccessful telephone outreach attempt made.   Alexandria Leach  [facility]  [facility] Guide  Direct Dial: [phone]  Fax [phone]

## 2023-05-29 ENCOUNTER — Other Ambulatory Visit (HOSPITAL_COMMUNITY): Payer: Self-pay | Admitting: Psychiatry

## 2023-05-29 MED ORDER — ZOLPIDEM TARTRATE 10 MG PO TABS: 10.0000 mg | ORAL_TABLET | Freq: Every evening | ORAL | 2 refills | Status: DC | PRN

## 2023-05-29 NOTE — Telephone Encounter (Signed)
 Sent to [employer]

## 2023-05-29 NOTE — Telephone Encounter (Signed)
Pt aware rx has been sent 

## 2023-06-05 ENCOUNTER — Telehealth: Payer: Self-pay

## 2023-06-05 ENCOUNTER — Telehealth: Payer: Self-pay | Admitting: *Deleted

## 2023-06-05 NOTE — Progress Notes (Signed)
 Complex Care Management Care Guide Note  06/05/2023 Name: [name] MRN: [id number] DOB: January 31, 1951  Alexandria Leach is a 73 y.o. year old female who is a primary care patient of Eustaquio Boyden, MD and is actively engaged with the care management team. I reached out to Oren Binet Brees by phone today to assist with scheduling  with the RN Case Manager.  Follow up plan: Telephone appointment with complex care management team member scheduled for:  4/4 Gwenevere Ghazi  [facility]  [facility] Guide  Direct Dial: [phone]  Fax [phone]

## 2023-06-05 NOTE — Progress Notes (Signed)
 Complex Care Management Care Guide Note  06/05/2023 Name: Alexandria Leach MRN: [id number] DOB: 1950-07-14  Alexandria Leach is a 73 y.o. year old female who is a primary care patient of Eustaquio Boyden, MD and is actively engaged with the care management team. I reached out to Oren Binet Norem by phone today to assist with re-scheduling  with the RN Case Manager.  Follow up plan: Unsuccessful telephone outreach attempt made. A HIPAA compliant phone message was left for the patient providing contact information and requesting a return call.No further outreach attempts will be made at this time. We have been unable to contact the patient to reschedule for complex care management services.   Gwenevere Ghazi  [facility]  Value-Based Care Institute, [facility] Guide  Direct Dial: [phone]  Fax [phone]

## 2023-06-05 NOTE — Patient Outreach (Signed)
 Care Coordination   Closure  Visit Note   06/05/2023 Name: Alexandria Leach MRN: [id number] DOB: 1950/12/24  Alexandria Leach is a 73 y.o. year old female who sees Eustaquio Boyden, MD for primary care. Unable to re-establish contact with patient.      Goals Addressed             This Visit's Progress    COMPLETED: Ongoing improvement post hospitalization for pneumonia and management/ education of health conditions       Interventions Today    Flowsheet Row Most Recent Value  Chronic Disease   Chronic disease during today's visit Congestive Heart Failure (CHF), Other  [sjogrens, fibromyalgia, anxiety]  General Interventions   General Interventions Discussed/Reviewed General Interventions Reviewed, Doctor Visits, Labs  [place] of current treatment plan for listed health conditions and patients adherence to plan as established by providers. Assessed for HF, sjogrens, fibromyalgia symptoms.]  Labs Kidney Function  [Advised to contact nephrologist regarding lab results or concerns.]  Doctor Visits Discussed/Reviewed Doctor Visits Reviewed  [confirmed patient has follow up appointment with rheumatologist.  Advised to discuss any concerns regarding worsening / ongoing symptoms related to sjogrens/ fibromyalgia conditions. Advisded to keep follow up visits with providers as recommended.]  Education Interventions   Education Provided Provided Education  [Advised to stay as physically active as possible. Advised to stay mentally healthy by following up with your psychiatrist regularly and taking prescribed medications.]  Mental Health Interventions   Mental Health Discussed/Reviewed Mental Health Reviewed, Coping Strategies, Anxiety  [Discussed coping strategies for anxiety and ways to manage fatigue]  Pharmacy Interventions   Pharmacy Dicussed/Reviewed Pharmacy Topics Reviewed  [medications reviewed and compliance discussed.]                SDOH assessments and  interventions completed:  No     Care Coordination Interventions:  No, not indicated   Follow up plan:  No further follow up.  Unable to re-establish contact with patient.     Encounter Outcome:  Patient Visit Completed   [name] RN, BSN, CCM [place]  [facility], Population Health Case Manager Phone: [phone]

## 2023-06-16 NOTE — Progress Notes (Unsigned)
  Electrophysiology Office Note:   Date:  06/17/2023  ID:  Dagmar, Adcox 23-Feb-1951, MRN [id number]  Primary Cardiologist: Olga Millers, MD Electrophysiologist: Lewayne Bunting, MD      History of Present Illness:   Alexandria Leach is a 73 y.o. female with h/o atrial flutter, s/p MV repair in setting of rheumatic heart disease, CHF, and CKF  seen today for routine electrophysiology followup.   Since last being seen in our clinic the patient reports doing well overall. Currently, she denies chest pain, palpitations, dyspnea, PND, orthopnea, nausea, vomiting, dizziness, syncope, edema, weight gain, or early satiety.   Review of systems complete and found to be negative unless listed in HPI.   EP Information / Studies Reviewed:    EKG is ordered today. Personal review as below.  EKG Interpretation Date/Time:  Tuesday June 17 2023 10:27:33 EDT Ventricular Rate:  67 PR Interval:  182 QRS Duration:  78 QT Interval:  434 QTC Calculation: 458 R Axis:   81  Text Interpretation: Normal sinus rhythm Nonspecific ST and T wave abnormality When compared with ECG of 24-Feb-2023 08:50, No significant change was found Confirmed by Maxine Glenn [id number]) on 06/17/2023 10:34:33 AM    Arrhythmia/Device History No specialty comments available.   Physical Exam:   VS:  BP 132/62   Pulse 67   Ht 5\' 1"  (1.549 m)   Wt 186 lb 12.8 oz (84.7 kg)   SpO2 98%   BMI 35.30 kg/m    Wt Readings from Last 3 Encounters:  06/17/23 186 lb 12.8 oz (84.7 kg)  04/14/23 196 lb 12.8 oz (89.3 kg)  02/24/23 197 lb 3.2 oz (89.4 kg)     GEN: No acute distress NECK: No JVD; No carotid bruits CARDIAC: Regular rate and rhythm, no murmurs, rubs, gallops RESPIRATORY:  Clear to auscultation without rales, wheezing or rhonchi  ABDOMEN: Soft, non-tender, non-distended EXTREMITIES:  No edema; No deformity   ASSESSMENT AND PLAN:    Atypical atrial flutter EKG today shows NSR with stable intervals.   Continue tikosyn 250 mcg BID Labs today Continue eliquis 5 mg BID for CHA2DS2/VASc of at least 4 Continue diltiazem 360 mg daily Continue lopressor 75 mg BID  Secondary hypercoagulable state Pt on Eliquis as above    HFpEF Volume status stable  HTN Stable on current regimen   CKD III Last CrCl 43. Discussed possibility of dose reduction of Tikosyn in the future if trend continues.    Follow up with EP APP in 6 months  Signed, Graciella Freer, PA-C

## 2023-06-17 ENCOUNTER — Ambulatory Visit: Payer: 59 | Attending: Student | Admitting: Student

## 2023-06-17 ENCOUNTER — Encounter: Payer: Self-pay | Admitting: Student

## 2023-06-17 VITALS — BP 132/62 | HR 67 | Ht 61.0 in | Wt 186.8 lb

## 2023-06-17 DIAGNOSIS — Z5181 Encounter for therapeutic drug level monitoring: Secondary | ICD-10-CM | POA: Diagnosis not present

## 2023-06-17 DIAGNOSIS — I484 Atypical atrial flutter: Secondary | ICD-10-CM | POA: Diagnosis not present

## 2023-06-17 DIAGNOSIS — D6869 Other thrombophilia: Secondary | ICD-10-CM | POA: Diagnosis not present

## 2023-06-17 DIAGNOSIS — I5032 Chronic diastolic (congestive) heart failure: Secondary | ICD-10-CM | POA: Diagnosis not present

## 2023-06-17 DIAGNOSIS — I10 Essential (primary) hypertension: Secondary | ICD-10-CM | POA: Diagnosis not present

## 2023-06-17 DIAGNOSIS — Z79899 Other long term (current) drug therapy: Secondary | ICD-10-CM

## 2023-06-17 NOTE — Patient Instructions (Signed)
 Medication Instructions:  Your physician recommends that you continue on your current medications as directed. Please refer to the Current Medication list given to you today.  *If you need a refill on your cardiac medications before your next appointment, please call your pharmacy*  Lab Work: BMET, MAG-TODAY If you have labs (blood work) drawn today and your tests are completely normal, you will receive your results only by: MyChart Message (if you have MyChart) OR A paper copy in the mail If you have any lab test that is abnormal or we need to change your treatment, we will call you to review the results.  Follow-Up: At [facility], you and your health needs are our priority.  As part of our continuing mission to provide you with exceptional heart care, our providers are all part of one team.  This team includes your primary Cardiologist (physician) and Advanced Practice Providers or APPs (Physician Assistants and Nurse Practitioners) who all work together to provide you with the care you need, when you need it.  Your next appointment:   6 month(s)  Provider:   Casimiro Needle "Mardelle Matte" Lanna Poche, PA-C       1st Floor: - Lobby - Registration  - Pharmacy  - Lab - Cafe  2nd Floor: - PV Lab - Diagnostic Testing (echo, CT, nuclear med)  3rd Floor: - Vacant  4th Floor: - TCTS (cardiothoracic surgery) - AFib Clinic - Structural Heart Clinic - Vascular Surgery  - Vascular Ultrasound  5th Floor: - HeartCare Cardiology (general and EP) - Clinical Pharmacy for coumadin, hypertension, lipid, weight-loss medications, and med management appointments    Valet parking services will be available as well.

## 2023-06-18 LAB — BASIC METABOLIC PANEL WITH GFR
BUN/Creatinine Ratio: 9 — ABNORMAL LOW (ref 12–28)
BUN: 12 mg/dL (ref 8–27)
CO2: 21 mmol/L (ref 20–29)
Calcium: 10.1 mg/dL (ref 8.7–10.3)
Chloride: 106 mmol/L (ref 96–106)
Creatinine, Ser: 1.4 mg/dL — ABNORMAL HIGH (ref 0.57–1.00)
Glucose: 90 mg/dL (ref 70–99)
Potassium: 4.5 mmol/L (ref 3.5–5.2)
Sodium: 141 mmol/L (ref 134–144)
eGFR: 40 mL/min/{1.73_m2} — ABNORMAL LOW (ref >59)

## 2023-06-18 LAB — MAGNESIUM: Magnesium: 2.1 mg/dL (ref 1.6–2.3)

## 2023-06-19 ENCOUNTER — Encounter

## 2023-06-20 ENCOUNTER — Ambulatory Visit: Payer: Self-pay

## 2023-06-20 NOTE — Patient Instructions (Addendum)
 Visit Information  Thank you for taking time to visit with me today. Please don't hesitate to contact me if I can be of assistance to you.   Following are the goals we discussed today:   Goals Addressed             This Visit's Progress    management/ education of health conditions       Interventions Today    Flowsheet Row Most Recent Value  Chronic Disease   Chronic disease during today's visit Congestive Heart Failure (CHF), Other  [sjogrens disease, fibromyalgia]  General Interventions   General Interventions Discussed/Reviewed General Interventions Reviewed, Doctor Visits  [evaluation of current treatment plan for listed health conditions and patients adherence to plan as established by provider. Assessed for HF, sjogrens, fibromyalgia symptoms.]  Doctor Visits Discussed/Reviewed Doctor Visits Reviewed  [reviewed upcoming provider visits. Advised to keep follow up visits with providers as recommended.]  Education Interventions   Education Provided Provided Education  [Assessed pain level on 10 point scale. Advised to stay hyrdrated, manage dry eye and mouth with recommended treatment, practice good dental hygiene, and reduce stress. Assess effectiveness of current treatment for eyes/ pain/ mouth.]  Provided Verbal Education On Other  [reviewed heart failure symptoms. Advised to notify provider for increase in symptoms or call 911 for severe symptoms. Answered questions regarding atrial flutter versus atrial fibrillation. Advised to contact provider for additional questions or concerns]  Pharmacy Interventions   Pharmacy Dicussed/Reviewed Pharmacy Topics Reviewed  [medication list reviewed.  Advised to notify eye doctor that she feels eye treatment is not working well.  Advised to consider using warm/ moist compress to eyes for increase irritation.]              Our next appointment is by telephone on 07/22/23 at 10 am  Please call the care guide team at [phone] if you  need to cancel or reschedule your appointment.   If you are experiencing a Mental Health or Behavioral Health Crisis or need someone to talk to, please call the Suicide and Crisis Lifeline: 988 call 1-800-273-TALK (toll free, 24 hour hotline)  Patient verbalizes understanding of instructions and care plan provided today and agrees to view in MyChart. Active MyChart status and patient understanding of how to access instructions and care plan via MyChart confirmed with patient.     [name] RN, BSN, CCM [employer], Population Health Case Manager Phone: [phone]

## 2023-06-20 NOTE — Patient Outreach (Signed)
 Care Coordination   Follow Up Visit Note   06/20/2023 Name: Alexandria Leach MRN: [id number] DOB: Jan 01, 1951  Alexandria Leach is a 73 y.o. year old female who sees Eustaquio Boyden, MD for primary care. I spoke with  Oren Binet Stahlman by phone today.  What matters to the patients health and wellness today?  Patient states she is doing ok.  She denies having any atrial flutter symptoms/ lightheadedness or dizziness. She reports having follow up visit with the cardiologist on 06/17/23.  Denies any change in treatment plan.  Patient states the pain related to her sjogrens/ fibromyalgia is a little better. She reports today's pain level is a 5.  She states the tramadol does not work that well for her for pain management. She states the tylenol seems to help more. Patient states she continues to have trouble with her eyes and dryness. She states the prescribed drops she's been on for years is not working. She states she applies a cream to her eyes that helps some. She reports she is scheduled to follow up with the eye doctor 07/2023 and will discuss this further. Patient states the systane she uses during the day seems to relieve the dryness to her eyes longer.  Patient reports [place] health care nurse visited on last week for yearly follow up.    Goals Addressed             This Visit's Progress    management/ education of health conditions       Interventions Today    Flowsheet Row Most Recent Value  Chronic Disease   Chronic disease during today's visit Congestive Heart Failure (CHF), Other  [sjogrens disease, fibromyalgia]  General Interventions   General Interventions Discussed/Reviewed General Interventions Reviewed, Doctor Visits  [evaluation of current treatment plan for listed health conditions and patients adherence to plan as established by provider. Assessed for HF, sjogrens, fibromyalgia symptoms.]  Doctor Visits Discussed/Reviewed Doctor Visits Reviewed  [reviewed upcoming  provider visits. Advised to keep follow up visits with providers as recommended.]  Education Interventions   Education Provided Provided Education  [Assessed pain level on 10 point scale. Advised to stay hyrdrated, manage dry eye and mouth with recommended treatment, practice good dental hygiene, and reduce stress. Assess effectiveness of current treatment for eyes/ pain/ mouth.]  Provided Verbal Education On Other  [reviewed heart failure symptoms. Advised to notify provider for increase in symptoms or call 911 for severe symptoms. Answered questions regarding atrial flutter versus atrial fibrillation. Advised to contact provider for additional questions or concerns]  Pharmacy Interventions   Pharmacy Dicussed/Reviewed Pharmacy Topics Reviewed  [medication list reviewed.  Advised to notify eye doctor that she feels eye treatment is not working well.  Advised to consider using warm/ moist compress to eyes for increase irritation.]              SDOH assessments and interventions completed:  No     Care Coordination Interventions:  Yes, provided   Follow up plan: Follow up call scheduled for 07/22/23 at 10 am    Encounter Outcome:  Patient Visit Completed   [name] RN, BSN, CCM   [facility], Population Health Case Manager Phone: [phone]

## 2023-07-02 NOTE — Progress Notes (Signed)
 HPI: FU mitral valve repair secondary to rheumatic heart disease and atrial flutter. Patient is status post mitral valve repair in 1980. Operative report not available. Nuclear study 11/14 showed EF 66 and normal perfusion. Carotid Dopplers October 2019 showed no significant obstruction. Patient found to be in atrial flutter at previous office visit.  Patient underwent successful cardioversion in May 2022.  Atrial flutter recurred.  She was seen by Dr. Carolynne Citron and tikosyn  recommended.  Echocardiogram 10/23 showed normal LV function, moderate left atrial enlargement, s/p MV repair with mild to moderate mitral stenosis with mean gradient 7.5 mmHg, mild mitral regurgitation, mild aortic stenosis with mean gradient 12 mmHg, moderate aortic insufficiency.  Since last seen, the patient has dyspnea with more extreme activities but not with routine activities. It is relieved with rest. It is not associated with chest pain. There is no orthopnea, PND or pedal edema. There is no syncope or palpitations. There is no exertional chest pain.    Current Outpatient Medications  Medication Sig Dispense Refill   ALPRAZolam  (XANAX ) 0.5 MG tablet Take 1 tablet (0.5 mg total) by mouth 3 (three) times daily as needed for anxiety. 90 tablet 2   antiseptic oral rinse (BIOTENE) LIQD 15 mLs by Mouth Rinse route 2 (two) times daily as needed for dry mouth.     apixaban  (ELIQUIS ) 5 MG TABS tablet Take 1 tablet (5 mg total) by mouth 2 (two) times daily. 100 day supply 200 tablet 1   cholecalciferol  (VITAMIN D ) 1000 UNITS tablet Take 1,000 Units by mouth daily.     cyanocobalamin  (VITAMIN B12) 1000 MCG tablet Take 1 tablet (1,000 mcg total) by mouth once a week.     diltiazem  (CARDIZEM  CD) 360 MG 24 hr capsule TAKE 1 CAPSULE BY MOUTH DAILY 90 capsule 3   docusate sodium  (COLACE) 100 MG capsule Take 100 mg by mouth daily.     dofetilide  (TIKOSYN ) 250 MCG capsule Take 1 capsule (250 mcg total) by mouth 2 (two) times daily. 180  capsule 2   ezetimibe  (ZETIA ) 10 MG tablet Take 1 tablet (10 mg total) by mouth at bedtime. 100 tablet 3   furosemide  (LASIX ) 20 MG tablet Take 2 tablets (40 mg total) by mouth daily. 60 tablet 0   gabapentin  (NEURONTIN ) 300 MG capsule TAKE 2 CAPSULES BY MOUTH IN THE MORNING 1 CAPSULE BY  MOUTH IN THE AFTERNOON AND  2 CAPSULES BY MOUTH AT  BEDTIME 500 capsule 3   loratadine  (CLARITIN ) 10 MG tablet Take 1 tablet (10 mg total) by mouth daily. 90 tablet 3   losartan  (COZAAR ) 100 MG tablet Take 0.5 tablets (50 mg total) by mouth daily. 50 tablet 3   magnesium  oxide (MAG-OX) 400 (240 Mg) MG tablet Take 1 tablet (400 mg total) by mouth daily.     metoprolol  tartrate (LOPRESSOR ) 50 MG tablet TAKE 1 AND 1/2 TABLETS BY MOUTH  TWICE DAILY 300 tablet 2   pantoprazole  (PROTONIX ) 40 MG tablet Take 1 tablet (40 mg total) by mouth daily. 100 tablet 3   Polyethyl Glycol-Propyl Glycol (SYSTANE) 0.4-0.3 % GEL ophthalmic gel Place 1 drop into both eyes 2 (two) times daily.      potassium chloride  (KLOR-CON  M) 10 MEQ tablet TAKE 1 TABLET BY MOUTH TWICE  DAILY 200 tablet 2   RESTASIS  0.05 % ophthalmic emulsion INSTILL ONE DROP IN BOTH  EYES TWO TIMES DAILY 180 each 0   thiamine  (VITAMIN B1) 100 MG tablet Take 1 tablet (100 mg total)  by mouth once a week.     traMADol  (ULTRAM ) 50 MG tablet TAKE 1 TABLET BY MOUTH DAILY AS  NEEDED 30 tablet 0   zolpidem  (AMBIEN ) 10 MG tablet Take 1 tablet (10 mg total) by mouth at bedtime as needed for sleep. 30 tablet 2   traZODone  (DESYREL ) 50 MG tablet Take 1 tablet (50 mg total) by mouth at bedtime. (Patient not taking: Reported on 07/08/2023) 90 tablet 2   No current facility-administered medications for this visit.     Past Medical History:  Diagnosis Date   Ankle fracture, left 02/19/2015   from a fall   Anxiety    Cataract 2014   corrected with surgery   CHF (congestive heart failure) (HCC)    CKD (chronic kidney disease) stage 3, GFR 30-59 ml/min [facility])    saw  nephrologist Dr Arlana Labor   Community acquired pneumonia 11/29/2022   Multifocal s/p hospitalization 11/2022     COVID-19 09/21/2021   DDD (degenerative disc disease), cervical    Depression    Fibromyalgia    Glaucoma    s/p surgery, sees ophtho Q6 mo   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 Thought benign.    Osteoarthritis    shoulders and knees, not RA per Dr Alvira Josephs, positive ANA, positive Ro   Osteopenia 09/18/2015   DEXA T -1.1 hip, -0.2 spine 08/2015    Personal history of urinary calculi latest 2014   Pneumonia 12/02/2011   PONV (postoperative nausea and vomiting)    Refusal of blood transfusions as patient is Jehovah's Witness    Rheumatic heart disease 1980   s/p mitral valve repair 1980   Sjogren's syndrome (HCC)    Trimalleolar fracture of left ankle 02/23/2015    Past Surgical History:  Procedure Laterality Date   BREAST BIOPSY Right 2006   benign   CARDIOVERSION N/A 07/18/2020   Procedure: CARDIOVERSION;  Surgeon: Euell Herrlich, MD;  [facility] ENDOSCOPY;  Service: Cardiovascular;  Laterality: N/A;   CHOLECYSTECTOMY  11/27/2011   Procedure: LAPAROSCOPIC CHOLECYSTECTOMY WITH INTRAOPERATIVE CHOLANGIOGRAM;  Surgeon: Levert Ready, MD;  Location: [facility] OR;  Service: General;  Laterality: N/A;  laparoscopic cholecystectomy with choleangiogram umbilical hernia repair   COLONOSCOPY  07/2014   WNL [facility])   COLONOSCOPY  08/2019   3 TAs, rpt 3 yrs ([place])   dexa  08/2012   normal per patient - no records available   ESOPHAGOGASTRODUODENOSCOPY  08/2019   reactive gastropathy, neg H pylori ([place])   EYE  SURGERY Bilateral 2014   cataract removal   MITRAL VALVE REPAIR  1980   open heart   ORIF ANKLE FRACTURE Left 02/26/2015   Procedure: OPEN REDUCTION INTERNAL FIXATION (ORIF) LEFT TRIMALLEOLAR ANKLE FRACTURE;  Surgeon: Wes Hamman, MD;  Location: [facility] OR;  Service: Orthopedics;  Laterality: Left;   TUBAL LIGATION  1980   UMBILICAL HERNIA REPAIR  11/27/2011   Procedure: HERNIA REPAIR UMBILICAL ADULT;  Surgeon: Levert Ready, MD;  Location: [facility] OR;  Service: General;  Laterality: N/A;   VAGINAL HYSTERECTOMY  1992   for fibroids --  partial, ovaries remain    Social History   Socioeconomic History   Marital status: Married    Spouse name: Charlann Confer   Number of children: 4   Years of education: 12   Highest education level: 12th grade  Occupational History    Employer: UNEMPLOYED    Comment: Disability  Tobacco Use   Smoking status: Never    Passive exposure: Past   Smokeless tobacco: Never   Tobacco comments:    Never smoke 08/29/21  Vaping Use   Vaping status: Never Used  Substance and Sexual Activity   Alcohol  use: No    Alcohol /week: 0.0 standard drinks of alcohol    Drug use: No   Sexual activity: Not Currently    Birth control/protection: Surgical  Other Topics Concern   Not on file  Social History Narrative   Lives with son, 1 dog   Occupation: unemployed, on disability for fibromyalgia since 2008.   Edu: HS   Religion: Jehova's witness   Activity: volunteers at senior center   Diet: some water, fruits/vegetables daily   No caffeine use   Social Drivers of Corporate investment banker Strain: Low Risk  (01/16/2023)   Overall Financial Resource Strain (CARDIA)    Difficulty of Paying Living Expenses: Not hard at all  Food Insecurity: No Food Insecurity (02/19/2023)   Hunger Vital Sign    Worried About Running Out of Food in the Last Year: Never true    Ran Out of Food in the Last Year: Never true  Transportation Needs: No Transportation Needs (02/19/2023)   PRAPARE  - Administrator, Civil Service (Medical): No    Lack of Transportation (Non-Medical): No  Physical Activity: Insufficiently Active (01/16/2023)   Exercise Vital Sign    Days of Exercise per Week: 3 days    Minutes of Exercise per Session: 30 min  Stress: No Stress Concern Present (01/16/2023)   [employer] of Occupational Health - Occupational Stress Questionnaire    Feeling of Stress : Not at all  Social Connections: Socially Integrated (01/16/2023)   Social Connection and Isolation Panel [NHANES]    Frequency of Communication with Friends and Family: More than three times a week    Frequency of Social Gatherings with Friends and Family: Three times a week    Attends Religious Services: More than 4 times per year    Active Member of Clubs or Organizations: Yes    Attends Banker Meetings: More than 4 times per year    Marital Status: Married  Catering manager Violence: Not At Risk (02/19/2023)   Humiliation, Afraid, Rape, and Kick questionnaire    Fear of Current or Ex-Partner: No    Emotionally Abused: No    Physically Abused: No    Sexually Abused: No    Family History  Problem Relation Age of Onset   Cancer Mother        lung (nonsmoker)   CAD Mother        MI in her 45s   ALS Mother    Kidney disease Father    Alcohol  abuse Father    Diabetes Father    Lupus Sister        and niece   Diabetes Sister    Stroke Sister    Depression Sister    Lupus Sister    Blindness Sister    Cancer Maternal Uncle        bone   Stroke Maternal Grandmother    Cancer  Brother        bone   Diabetes Brother    Heart attack Brother    Healthy Son    Healthy Son    Healthy Son    Healthy Son    Kidney failure Other        on HD   Breast cancer Neg Hx     ROS: no fevers or chills, productive cough, hemoptysis, dysphasia, odynophagia, melena, hematochezia, dysuria, hematuria, rash, seizure activity, orthopnea, PND, pedal edema, claudication.  Remaining systems are negative.  Physical Exam: Well-developed well-nourished in no acute distress.  Skin is warm and dry.  HEENT is normal.  Neck is supple.  Chest is clear to auscultation with normal expansion.  Cardiovascular exam is regular rate and rhythm.  2/6 systolic murmur left sternal border. Abdominal exam nontender or distended. No masses palpated. Extremities show no edema. neuro grossly intact  A/P  1 atrial flutter-patient remains in sinus rhythm on exam today.  Continue Tikosyn , Cardizem , metoprolol  and apixaban .  Check hgb; recent laboratories showed normal magnesium  and potassium; creatinine 1.4. May need to reduce tikosyn  dose in the future if renal function declines further.  2 history of mitral valve repair-continue SBE prophylaxis.  We will plan repeat echocardiogram (to also reassess AS/AI).  3 chronic diastolic congestive heart failure-patient remains euvolemic on exam.  We will continue diuretic at present dose.  4 hypertension-BP controlled.  Continue present medications.  5 hyperlipidemia-continue statin.  Check lipids and liver.  6 history of palpitations-continue beta-blocker.  Symptoms are controlled.  Alexandria Angel, MD

## 2023-07-08 ENCOUNTER — Ambulatory Visit: Payer: 59 | Attending: Cardiology | Admitting: Cardiology

## 2023-07-08 ENCOUNTER — Encounter: Payer: Self-pay | Admitting: Cardiology

## 2023-07-08 VITALS — BP 117/73 | HR 69 | Ht 61.0 in | Wt 189.0 lb

## 2023-07-08 DIAGNOSIS — E7800 Pure hypercholesterolemia, unspecified: Secondary | ICD-10-CM

## 2023-07-08 DIAGNOSIS — I10 Essential (primary) hypertension: Secondary | ICD-10-CM | POA: Diagnosis not present

## 2023-07-08 DIAGNOSIS — I484 Atypical atrial flutter: Secondary | ICD-10-CM

## 2023-07-08 DIAGNOSIS — I359 Nonrheumatic aortic valve disorder, unspecified: Secondary | ICD-10-CM

## 2023-07-08 DIAGNOSIS — I5032 Chronic diastolic (congestive) heart failure: Secondary | ICD-10-CM

## 2023-07-08 MED ORDER — AMOXICILLIN 500 MG PO TABS: ORAL_TABLET | ORAL | 6 refills | Status: AC

## 2023-07-08 NOTE — Patient Instructions (Signed)
 Medication Instructions:   AMOX SENT TO THE PHARMACY  *If you need a refill on your cardiac medications before your next appointment, please call your pharmacy*  Lab Work:  Your physician recommends that you HAVE LAB WORK TODAY  If you have labs (blood work) drawn today and your tests are completely normal, you will receive your results only by: MyChart Message (if you have MyChart) OR A paper copy in the mail If you have any lab test that is abnormal or we need to change your treatment, we will call you to review the results.  Testing/Procedures:  Your physician has requested that you have an echocardiogram. Echocardiography is a painless test that uses sound waves to create images of your heart. It provides your doctor with information about the size and shape of your heart and how well your heart's chambers and valves are working. This procedure takes approximately one hour. There are no restrictions for this procedure. Please do NOT wear cologne, perfume, aftershave, or lotions (deodorant is allowed). Please arrive 15 minutes prior to your appointment time.  Please note: We ask at that you not bring children with you during ultrasound (echo/ vascular) testing. Due to room size and safety concerns, children are not allowed in the ultrasound rooms during exams. Our front office staff cannot provide observation of children in our lobby area while testing is being conducted. An adult accompanying a patient to their appointment will only be allowed in the ultrasound room at the discretion of the ultrasound technician under special circumstances. We apologize for any inconvenience.   Follow-Up: At [facility], you and your health needs are our priority.  As part of our continuing mission to provide you with exceptional heart care, our providers are all part of one team.  This team includes your primary Cardiologist (physician) and Advanced Practice Providers or APPs (Physician  Assistants and Nurse Practitioners) who all work together to provide you with the care you need, when you need it.  Your next appointment:   12 month(s)  Provider:   Alexandria Angel, MD           1st Floor: - Lobby - Registration  - Pharmacy  - Lab - Cafe  2nd Floor: - PV Lab - Diagnostic Testing (echo, CT, nuclear med)  3rd Floor: - Vacant  4th Floor: - TCTS (cardiothoracic surgery) - AFib Clinic - Structural Heart Clinic - Vascular Surgery  - Vascular Ultrasound  5th Floor: - HeartCare Cardiology (general and EP) - Clinical Pharmacy for coumadin, hypertension, lipid, weight-loss medications, and med management appointments    Valet parking services will be available as well.

## 2023-07-09 ENCOUNTER — Encounter: Payer: Self-pay | Admitting: *Deleted

## 2023-07-09 DIAGNOSIS — E7800 Pure hypercholesterolemia, unspecified: Secondary | ICD-10-CM

## 2023-07-09 LAB — BASIC METABOLIC PANEL WITH GFR
BUN/Creatinine Ratio: 9 — ABNORMAL LOW (ref 12–28)
BUN: 14 mg/dL (ref 8–27)
CO2: 22 mmol/L (ref 20–29)
Calcium: 10.1 mg/dL (ref 8.7–10.3)
Chloride: 107 mmol/L — ABNORMAL HIGH (ref 96–106)
Creatinine, Ser: 1.56 mg/dL — ABNORMAL HIGH (ref 0.57–1.00)
Glucose: 103 mg/dL — ABNORMAL HIGH (ref 70–99)
Potassium: 4.1 mmol/L (ref 3.5–5.2)
Sodium: 142 mmol/L (ref 134–144)
eGFR: 36 mL/min/{1.73_m2} — ABNORMAL LOW (ref >59)

## 2023-07-09 LAB — LIPID PANEL
Chol/HDL Ratio: 3.4 ratio (ref 0.0–4.4)
Cholesterol, Total: 222 mg/dL — ABNORMAL HIGH (ref 100–199)
HDL: 65 mg/dL (ref >39)
LDL Chol Calc (NIH): 141 mg/dL — ABNORMAL HIGH (ref 0–99)
Triglycerides: 93 mg/dL (ref 0–149)
VLDL Cholesterol Cal: 16 mg/dL (ref 5–40)

## 2023-07-09 LAB — HEPATIC FUNCTION PANEL
ALT: 14 IU/L (ref 0–32)
AST: 22 IU/L (ref 0–40)
Albumin: 4.4 g/dL (ref 3.9–4.9)
Alkaline Phosphatase: 78 IU/L (ref 44–121)
Bilirubin Total: 0.4 mg/dL (ref 0.0–1.2)
Bilirubin, Direct: 0.15 mg/dL (ref 0.00–0.40)
Total Protein: 7.3 g/dL (ref 6.0–8.5)

## 2023-07-11 ENCOUNTER — Encounter: Payer: Self-pay | Admitting: Cardiology

## 2023-07-16 ENCOUNTER — Other Ambulatory Visit: Payer: Self-pay | Admitting: Internal Medicine

## 2023-07-16 DIAGNOSIS — I4892 Unspecified atrial flutter: Secondary | ICD-10-CM

## 2023-07-17 NOTE — Telephone Encounter (Signed)
 Prescription refill request for Eliquis  received. Indication:Aflutter Last office visit: 07/08/23 Audery Blazing)  Scr: 1.56 (07/08/23)  Age: 73 Weight: 85.7kg  Appropriate dose. Refill sent.

## 2023-07-18 ENCOUNTER — Other Ambulatory Visit: Payer: Self-pay | Admitting: Cardiology

## 2023-07-21 ENCOUNTER — Encounter: Payer: Self-pay | Admitting: Family Medicine

## 2023-07-21 ENCOUNTER — Ambulatory Visit (INDEPENDENT_AMBULATORY_CARE_PROVIDER_SITE_OTHER): Payer: 59 | Admitting: Family Medicine

## 2023-07-21 ENCOUNTER — Telehealth: Payer: Self-pay

## 2023-07-21 VITALS — BP 130/60 | HR 67 | Temp 98.5°F | Ht 61.0 in | Wt 189.0 lb

## 2023-07-21 DIAGNOSIS — Y92009 Unspecified place in unspecified non-institutional (private) residence as the place of occurrence of the external cause: Secondary | ICD-10-CM | POA: Insufficient documentation

## 2023-07-21 DIAGNOSIS — I484 Atypical atrial flutter: Secondary | ICD-10-CM | POA: Diagnosis not present

## 2023-07-21 DIAGNOSIS — W19XXXA Unspecified fall, initial encounter: Secondary | ICD-10-CM

## 2023-07-21 DIAGNOSIS — E7800 Pure hypercholesterolemia, unspecified: Secondary | ICD-10-CM

## 2023-07-21 DIAGNOSIS — M797 Fibromyalgia: Secondary | ICD-10-CM | POA: Diagnosis not present

## 2023-07-21 DIAGNOSIS — R2689 Other abnormalities of gait and mobility: Secondary | ICD-10-CM

## 2023-07-21 DIAGNOSIS — N1832 Chronic kidney disease, stage 3b: Secondary | ICD-10-CM | POA: Diagnosis not present

## 2023-07-21 DIAGNOSIS — M150 Primary generalized (osteo)arthritis: Secondary | ICD-10-CM | POA: Diagnosis not present

## 2023-07-21 DIAGNOSIS — Z9889 Other specified postprocedural states: Secondary | ICD-10-CM | POA: Diagnosis not present

## 2023-07-21 DIAGNOSIS — G6289 Other specified polyneuropathies: Secondary | ICD-10-CM

## 2023-07-21 DIAGNOSIS — M3501 Sicca syndrome with keratoconjunctivitis: Secondary | ICD-10-CM

## 2023-07-21 DIAGNOSIS — I5032 Chronic diastolic (congestive) heart failure: Secondary | ICD-10-CM | POA: Diagnosis not present

## 2023-07-21 NOTE — Assessment & Plan Note (Signed)
 Statin intolerance.  Continues ezetimibe .  Planned lipid clinic eval for [id number].

## 2023-07-21 NOTE — Assessment & Plan Note (Signed)
 Sees EP and cards now on eliquis .

## 2023-07-21 NOTE — Telephone Encounter (Signed)
 Pt seen today for OV and provided Independent Assessment for [facility]) form.   Faxed completed form to [place] [employer] at [phone].  Lvm asking pt to call back. Need to know if she wants to pick up original or have us  mail it to her.   [Original form in basket on Lisa's desk. Made copy to scan.]

## 2023-07-21 NOTE — Telephone Encounter (Signed)
 Pt rtn call. I asked about original form. Pt requests original be mailed to address in chart.   Mailed original form.

## 2023-07-21 NOTE — Assessment & Plan Note (Addendum)
 Last outpatient PT was 2016.  Refer to [facility] PT given recent fall at home.  I will also fill out attestation of medical need form for personal care services.

## 2023-07-21 NOTE — Assessment & Plan Note (Signed)
 Seems euvolemic on lasix  40mg  daily.

## 2023-07-21 NOTE — Patient Instructions (Addendum)
 I will ask home health nurse and physical therapist to come out to house for home safety recommendations - ie shower hand rails Continue gabapentin  for now.  Let us  know if worsening neuropathy symptoms for further evaluation.

## 2023-07-21 NOTE — Assessment & Plan Note (Addendum)
 Appreciate rheum care. Predominant sicca symptoms. Remains off immunosuppressants

## 2023-07-21 NOTE — Assessment & Plan Note (Signed)
 Needs SBE ppx

## 2023-07-21 NOTE — Assessment & Plan Note (Signed)
This contributes to chronic pain

## 2023-07-21 NOTE — Progress Notes (Signed)
 Ph: [phone] Fax: [phone]   Patient ID: Alexandria Leach, female    DOB: 1950/07/18, 73 y.o.   MRN: [id number]  This visit was conducted in person.  BP 130/60   Pulse 67   Temp 98.5 F (36.9 C) (Oral)   Ht 5\' 1"  (1.549 m)   Wt 189 lb (85.7 kg)   SpO2 95%   BMI 35.71 kg/m    CC: 6 mo f/u visit  Subjective:   HPI: Alexandria Leach is a 73 y.o. female presenting on 07/21/2023 for Medical Management of Chronic Issues (F/U; want to talk about getting assistance at home; having trouble with every day activities like taking a shower or changing sheets on bed.)   She requests I fill out [facility] attestation form. She needs assistance getting in and out of tub as well as changing bed sheets. She is afraid to get out of tub due to fear of slipping.  She had a fall last week in laundry room while going down the steps.  Does not have hand rail.  She has shower chair that stays in the tub.  She would like home health safety evaluation.   Geriatric Assessment: Activities of Daily Living:     Bathing- independent    Dressing- independent    Eating- independent    Toileting- independent    Transferring- independent    Continence- independent Overall Assessment: independent  Instrumental Activities of Daily Living:     Transportation- independent    Meal/Food Preparation- independent    Shopping Errands- dependent - son does groceries    Housekeeping/Chores-  dependent - pain limits ability clean the house     Money Management/Finances- dependent - son manages this    Medication Management- independent - but notes she can get confused - recently missed 3 days of eliquis .     Ability to Use Telephone- independent    Laundry- independent - but recently had a fall last week going down steps into laundry room  Overall Assessment:  partially dependent  Notes worsening ongoing bilateral lower leg pain associated with paresthesias and numbness and achy pain from thighs to  the feet. She takes gabapentin  600/300/600. Leg pain worse at night time. Worsening FM pains as well. Less driving due to increased pain. Notes R leg can give way. Last saw neurology 2016 [facility]) with neck MRI, no lumbar MRI or NCS done.   Known h/o MV repair 1980 due to rheumatic heart disease, atrial flutter s/p cardioversion 07/2020 with subsequent recurrence now on Tikosyn , last saw cardiology and EP 06/2023. Continue SBE ppx.   HLD - currently only on zetia . Statins caused cramping. Pending appt with lipid clinic to discuss PCSK9i commencement.   CKD - established with Dr Cindra Cree at [facility] last seen 03/2023. 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  Planned f/u 6 months.   Sjogren's with keratoconjunctivitis sicca - off immunosuppressants, off plaquenil  since 2021. Regularly sees rheum, last seen 03/2023. Also known FM.      Relevant past medical, surgical, family and social history reviewed and updated as indicated. Interim medical history since our last visit reviewed. Allergies and medications reviewed and updated. Outpatient Medications Prior to Visit  Medication Sig Dispense Refill   ALPRAZolam  (XANAX ) 0.5 MG tablet Take 1 tablet (0.5 mg total) by mouth 3 (three) times daily as needed for anxiety. 90 tablet 2   amoxicillin  (AMOXIL ) 500 MG tablet Take 4 tablets 1  hour prior to dental work 4 tablet 6   antiseptic oral rinse (BIOTENE) LIQD 15 mLs by Mouth Rinse route 2 (two) times daily as needed for dry mouth.     apixaban  (ELIQUIS ) 5 MG TABS tablet TAKE 1 TABLET BY MOUTH TWICE  DAILY 200 tablet 1   cholecalciferol  (VITAMIN D ) 1000 UNITS tablet Take 1,000 Units by mouth daily.     cyanocobalamin  (VITAMIN B12) 1000 MCG tablet Take 1 tablet (1,000 mcg total) by mouth once a week.     diltiazem  (CARDIZEM  CD) 360 MG 24 hr capsule TAKE 1 CAPSULE BY MOUTH DAILY 90 capsule 3   docusate sodium  (COLACE) 100 MG capsule Take 100  mg by mouth daily.     dofetilide  (TIKOSYN ) 250 MCG capsule Take 1 capsule (250 mcg total) by mouth 2 (two) times daily. 180 capsule 2   ezetimibe  (ZETIA ) 10 MG tablet Take 1 tablet (10 mg total) by mouth at bedtime. 100 tablet 3   furosemide  (LASIX ) 20 MG tablet Take 2 tablets (40 mg total) by mouth daily. 60 tablet 0   gabapentin  (NEURONTIN ) 300 MG capsule TAKE 2 CAPSULES BY MOUTH IN THE MORNING 1 CAPSULE BY  MOUTH IN THE AFTERNOON AND  2 CAPSULES BY MOUTH AT  BEDTIME 500 capsule 3   loratadine  (CLARITIN ) 10 MG tablet Take 1 tablet (10 mg total) by mouth daily. 90 tablet 3   losartan  (COZAAR ) 100 MG tablet Take 0.5 tablets (50 mg total) by mouth daily. 50 tablet 3   magnesium  oxide (MAG-OX) 400 (240 Mg) MG tablet Take 1 tablet (400 mg total) by mouth daily.     metoprolol  tartrate (LOPRESSOR ) 50 MG tablet TAKE 1 AND 1/2 TABLETS BY MOUTH  TWICE DAILY 300 tablet 3   pantoprazole  (PROTONIX ) 40 MG tablet Take 1 tablet (40 mg total) by mouth daily. 100 tablet 3   Polyethyl Glycol-Propyl Glycol (SYSTANE) 0.4-0.3 % GEL ophthalmic gel Place 1 drop into both eyes 2 (two) times daily.      potassium chloride  (KLOR-CON  M) 10 MEQ tablet TAKE 1 TABLET BY MOUTH TWICE  DAILY 200 tablet 2   RESTASIS  0.05 % ophthalmic emulsion INSTILL ONE DROP IN BOTH  EYES TWO TIMES DAILY 180 each 0   thiamine  (VITAMIN B1) 100 MG tablet Take 1 tablet (100 mg total) by mouth once a week.     traMADol  (ULTRAM ) 50 MG tablet TAKE 1 TABLET BY MOUTH DAILY AS  NEEDED 30 tablet 0   zolpidem  (AMBIEN ) 10 MG tablet Take 1 tablet (10 mg total) by mouth at bedtime as needed for sleep. 30 tablet 2   traZODone  (DESYREL ) 50 MG tablet Take 1 tablet (50 mg total) by mouth at bedtime. (Patient not taking: Reported on 07/21/2023) 90 tablet 2   No facility-administered medications prior to visit.     Per HPI unless specifically indicated in ROS section below Review of Systems  Objective:  BP 130/60   Pulse 67   Temp 98.5 F (36.9 C) (Oral)    Ht 5\' 1"  (1.549 m)   Wt 189 lb (85.7 kg)   SpO2 95%   BMI 35.71 kg/m   Wt Readings from Last 3 Encounters:  07/21/23 189 lb (85.7 kg)  07/08/23 189 lb (85.7 kg)  06/17/23 186 lb 12.8 oz (84.7 kg)      Physical Exam Vitals and nursing note reviewed.  Constitutional:      Appearance: Normal appearance. She is not ill-appearing.  Cardiovascular:     Rate and  Rhythm: Normal rate and regular rhythm.     Pulses: Normal pulses.     Heart sounds: Murmur (2/6 systolic USB) heard.  Pulmonary:     Effort: Pulmonary effort is normal. No respiratory distress.     Breath sounds: Normal breath sounds. No wheezing or rales.  Musculoskeletal:     Right lower leg: No edema.     Left lower leg: No edema.  Skin:    General: Skin is warm and dry.     Findings: No rash.  Neurological:     General: No focal deficit present.     Mental Status: She is alert.     Comments:  5/5 strength BLE  Psychiatric:        Mood and Affect: Mood normal.        Behavior: Behavior normal.       Results for orders placed or performed in visit on 07/08/23  Lipid panel   Collection Time: 07/08/23 11:29 AM  Result Value Ref Range   Cholesterol, Total 222 (H) 100 - 199 mg/dL   Triglycerides 93 0 - 149 mg/dL   HDL 65 >16 mg/dL   VLDL Cholesterol Cal 16 5 - 40 mg/dL   LDL Chol Calc (NIH) 109 (H) 0 - 99 mg/dL   Chol/HDL Ratio 3.4 0.0 - 4.4 ratio  Hepatic function panel   Collection Time: 07/08/23 11:29 AM  Result Value Ref Range   Total Protein 7.3 6.0 - 8.5 g/dL   Albumin 4.4 3.9 - 4.9 g/dL   Bilirubin Total 0.4 0.0 - 1.2 mg/dL   Bilirubin, Direct 6.04 0.00 - 0.40 mg/dL   Alkaline Phosphatase 78 44 - 121 IU/L   AST 22 0 - 40 IU/L   ALT 14 0 - 32 IU/L  Basic Metabolic Panel (BMET)   Collection Time: 07/08/23 11:29 AM  Result Value Ref Range   Glucose 103 (H) 70 - 99 mg/dL   BUN 14 8 - 27 mg/dL   Creatinine, Ser 5.40 (H) 0.57 - 1.00 mg/dL   eGFR 36 (L) >98 JX/BJY/7.82   BUN/Creatinine Ratio 9 (L)  12 - 28   Sodium 142 134 - 144 mmol/L   Potassium 4.1 3.5 - 5.2 mmol/L   Chloride 107 (H) 96 - 106 mmol/L   CO2 22 20 - 29 mmol/L   Calcium 10.1 8.7 - 10.3 mg/dL   Lab Results  Component Value Date   TSH 4.00 01/07/2023    Lab Results  Component Value Date   VITAMINB12 520 01/07/2023   Lab Results  Component Value Date   FOLATE 7.2 01/12/2021    Assessment & Plan:   Problem List Items Addressed This Visit     HYPERCHOLESTEROLEMIA   Statin intolerance.  Continues ezetimibe .  Planned lipid clinic eval for PCSK9i.       History of mitral valve repair   Needs SBE ppx      (HFpEF) heart failure with preserved ejection fraction (HCC)   Seems euvolemic on lasix  40mg  daily.       Sjogren's syndrome (HCC)   Appreciate rheum care. Predominant sicca symptoms. Remains off immunosuppressants      Relevant Orders   Ambulatory referral to Home Health   CKD (chronic kidney disease) stage 3, GFR 30-59 ml/min (HCC)   Established with nephrology Dr Cindra Cree, latest note reviewed.       Fibromyalgia   This contributes to chronic pain.       Relevant Orders   Ambulatory referral to Home  Health   Osteoarthritis   Imbalance   Last outpatient PT was 2016.  Refer to [facility] PT given recent fall at home.  I will also fill out attestation of medical need form for personal care services.       Relevant Orders   Ambulatory referral to Home Health   Peripheral neuropathy - Primary   Sjogren vs idiopathic. Saw neurology eval 2016 ([place]).  Continues gabapentin  600/300/600. Will refer for [facility] PT as per above. To let us  know if ongoing or worsening symptoms for further evaluation - return to neuro vs NCS.       Relevant Orders   Ambulatory referral to Home Health   Atrial flutter (HCC)   Sees EP and cards now on eliquis .       Fall at home   Suffered fall last week while walking down steps into her laundry room Fortunately no significant injury She notes ongoing  unsteadiness and fear of falls, especially when getting out of her tub shower.  Will refer to HHPT for home safety eval/fall prevention.       Relevant Orders   Ambulatory referral to Home Health     No orders of the defined types were placed in this encounter.   Orders Placed This Encounter  Procedures   Ambulatory referral to Home Health    Referral Priority:   Routine    Referral Type:   Home Health Care    Referral Reason:   Specialty Services Required    Requested Specialty:   Home Health Services    Number of Visits Requested:   1    Patient Instructions  I will ask home health nurse and physical therapist to come out to house for home safety recommendations - ie shower hand rails Continue gabapentin  for now.  Let us  know if worsening neuropathy symptoms for further evaluation.   Follow up plan: Return in about 6 months (around 01/21/2024), or if symptoms worsen or fail to improve, for annual exam, prior fasting for blood work, medicare wellness visit.  Claire Crick, MD

## 2023-07-21 NOTE — Assessment & Plan Note (Addendum)
 Sjogren vs idiopathic. Saw neurology eval 2016 ([facility]).  Continues gabapentin  600/300/600. Will refer for [facility] PT as per above. To let us  know if ongoing or worsening symptoms for further evaluation - return to neuro vs NCS.

## 2023-07-21 NOTE — Assessment & Plan Note (Addendum)
 Established with nephrology Dr Cindra Cree, latest note reviewed.

## 2023-07-21 NOTE — Assessment & Plan Note (Addendum)
 Suffered fall last week while walking down steps into her laundry room Fortunately no significant injury She notes ongoing unsteadiness and fear of falls, especially when getting out of her tub shower.  Will refer to HHPT for home safety eval/fall prevention.

## 2023-07-22 ENCOUNTER — Ambulatory Visit: Payer: Self-pay

## 2023-07-22 ENCOUNTER — Other Ambulatory Visit: Payer: Self-pay

## 2023-07-22 NOTE — Patient Outreach (Signed)
 Complex Care Management   Visit Note  07/22/2023  Name:  Alexandria Leach MRN: [id number] DOB: 1950/04/21  Situation: Referral received for Complex Care Management related to Heart Failure and fibromyalgia/ polyneuropathy  I obtained verbal consent from Patient.  Visit completed with patient  on the phone  Background:   Past Medical History:  Diagnosis Date   Ankle fracture, left 02/19/2015   from a fall   Anxiety    Cataract 2014   corrected with surgery   CHF (congestive heart failure) (HCC)    CKD (chronic kidney disease) stage 3, GFR 30-59 ml/min [facility])    saw nephrologist Dr Arlana Labor   Community acquired pneumonia 11/29/2022   Multifocal s/p hospitalization 11/2022     COVID-19 09/21/2021   DDD (degenerative disc disease), cervical    Depression    Fibromyalgia    Glaucoma    s/p surgery, sees ophtho Q6 mo   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 Thought benign.    Osteoarthritis    shoulders and knees, not RA per Dr Alvira Josephs, positive ANA, positive Ro   Osteopenia 09/18/2015   DEXA T -1.1 hip, -0.2 spine 08/2015    Personal history of urinary calculi latest 2014   Pneumonia 12/02/2011   PONV (postoperative nausea and vomiting)    Refusal of blood transfusions as patient is Jehovah's Witness    Rheumatic heart disease 1980   s/p mitral valve repair 1980   Sjogren's syndrome (HCC)    Trimalleolar fracture of left ankle 02/23/2015    Assessment: Patient Reported Symptoms:  Cognitive Cognitive Status: Alert and oriented to person, place, and time, Insightful and able to interpret abstract concepts, Normal speech and  language skills Cognitive/Intellectual Conditions Management [RPT]: None reported or documented in medical history or problem list   Health Maintenance Behaviors: Annual physical exam, Immunizations Healing Pattern: Average  Neurological Neurological Review of Symptoms: No symptoms reported Neurological Conditions:  (peripheral neuropathy, TIA 2019) Neurological Management Strategies: Medication therapy, Routine screening Neurological Self-Management Outcome: 3 (uncertain) Neurological Comment: Patient reports worsening LE peripheral neuropathy pain.  HEENT HEENT Symptoms Reported: Other: Other HEENT Symptoms/Conditions: mouth and eye dryness- due to Sjogrens HEENT Management Strategies: Routine screening, Medication therapy HEENT Self-Management Outcome: 4 (good)    Cardiovascular Cardiovascular Symptoms Reported: No symptoms reported Does patient have uncontrolled Hypertension?: No Cardiovascular Conditions: Heart failure, Hypertension (atrial flutter/ atrial fibrillation.) Cardiovascular Management Strategies: Routine screening, Medication therapy, Diet modification, Adequate rest Weight: 189 lb (85.7 kg) Cardiovascular Self-Management Outcome: 4 (good) Cardiovascular Comment: Patient states she continues to take Tikosyn  and is scheduled to have echocardiogram on 07/14/23. Denies increase in HF syptoms.  Respiratory Respiratory Symptoms Reported: No symptoms reported    Endocrine Patient reports the following symptoms related to hypoglycemia or hyperglycemia : No symptoms reported Is patient diabetic?: No    Gastrointestinal Gastrointestinal Symptoms Reported: No symptoms reported   Nutrition Risk Screen (CP): No indicators present  Genitourinary Genitourinary Symptoms Reported: No symptoms reported    Integumentary Integumentary Symptoms Reported: No symptoms reported    Musculoskeletal Musculoskelatal Symptoms Reviewed: Muscle pain, Unsteady gait,  Difficulty walking,  Weakness Musculoskeletal Conditions: Fibromyalgia, Osteoarthritis, Other Other Musculoskeletal Conditions: Sjogrens Musculoskeletal Management Strategies: Routine screening, Medication therapy Musculoskeletal Self-Management Outcome: 3 (uncertain) Musculoskeletal Comment: Patient reports worsening fibromyalgia pain and stiffness.  She states it's becoming more difficult to manage ADL's. Reports fall approximately 1 week ago walking downstairs in her home. Patient states she doesn't have railings on her stairs to hold on to.  States husband does not want to put railing on stairs.. Reports submitting PCA attestation after being signed by primary provider. Per chart review primary care provider ordered home health safety evaluation.      Psychosocial Psychosocial Symptoms Reported: No symptoms reported Behavioral Health Conditions: Anxiety, Depression Behavioral Management Strategies: Medication therapy, Counseling (routine follow up with behavioral health) Behavioral Health Self-Management Outcome: 4 (good) Major Change/Loss/Stressor/Fears (CP): Denies Techniques to Cope with Loss/Stress/Change: Diversional activities, Meditation Quality of Family Relationships: supportive Do you feel physically threatened by others?: No      07/22/2023   10:47 AM  Depression screen PHQ 2/9  Decreased Interest 0  Down, Depressed, Hopeless 0  PHQ - 2 Score 0    Vitals:   07/22/23 1024  BP: 130/60  Pulse: 67  Temp: 98.5 F (36.9 C)  SpO2: 95%    Medications Reviewed Today     Reviewed by Deitrich Steve E, RN (Registered Nurse) on 07/22/23 at 1033  Med List Status: <None>   Medication Order Taking? Sig Documenting Provider Last Dose Status Informant  ALPRAZolam  (XANAX ) 0.5 MG tablet [id number] Yes Take 1 tablet (0.5 mg total) by mouth 3 (three) times daily as needed for anxiety. Alysia Bachelor, MD Taking Active   amoxicillin  (AMOXIL ) 500 MG tablet [id number] Yes Take 4 tablets 1 hour prior to dental  work Audery Blazing, Deannie Fabian, MD Taking Active   antiseptic oral rinse Signe Dries) LIQD [id number] Yes 15 mLs by Mouth Rinse route 2 (two) times daily as needed for dry mouth. [provider] Taking Active Self  apixaban  (ELIQUIS ) 5 MG TABS tablet [id number] Yes TAKE 1 TABLET BY MOUTH TWICE  DAILY Crenshaw, Deannie Fabian, MD Taking Active   cholecalciferol  (VITAMIN D ) 1000 UNITS tablet [id number] Yes Take 1,000 Units by mouth daily. [provider] Taking Active Self  cyanocobalamin  (VITAMIN B12) 1000 MCG tablet [id number] Yes Take 1 tablet (1,000 mcg total) by mouth once a week. Claire Crick, MD Taking Active Self           Med Note (CARD, AMY L   Fri Nov 29, 2022  9:38 PM) Kent Pear on Friday.  diltiazem  (CARDIZEM  CD) 360 MG 24 hr capsule [id number] Yes TAKE 1 CAPSULE BY MOUTH DAILY Crenshaw, Deannie Fabian, MD Taking Active   docusate sodium  (COLACE) 100 MG capsule [id number] Yes Take 100 mg by mouth daily. [provider] Taking Active Self  dofetilide  (TIKOSYN ) 250 MCG capsule [id number] Yes Take 1 capsule (250 mcg total) by mouth 2 (two) times daily. Fenton, Clint R, PA Taking Active   ezetimibe  (ZETIA ) 10 MG tablet [id number] Yes Take 1 tablet (10 mg total) by mouth at bedtime. Claire Crick, MD Taking Active   furosemide  (LASIX ) 20 MG tablet [id number] Yes Take 2 tablets (40 mg total) by mouth daily. Deforest Fast, MD Taking Active   gabapentin  (NEURONTIN ) 300 MG capsule [id number] Yes TAKE 2 CAPSULES BY MOUTH IN THE MORNING 1 CAPSULE BY  MOUTH IN THE AFTERNOON AND  2 CAPSULES BY MOUTH AT  BEDTIME Claire Crick, MD Taking Active   loratadine  (CLARITIN ) 10 MG tablet [id number]  Yes Take 1 tablet (10 mg total) by mouth daily. Claire Crick, MD Taking Active Self  losartan  (COZAAR ) 100 MG tablet [id number] Yes Take 0.5 tablets (50 mg total) by mouth daily. Claire Crick, MD Taking Active   magnesium  oxide (MAG-OX) 400 (240 Mg) MG tablet [id number] Yes Take 1 tablet (400 mg total) by  mouth daily. Claire Crick, MD Taking Active   metoprolol  tartrate (LOPRESSOR ) 50 MG tablet [id number] Yes TAKE 1 AND 1/2 TABLETS BY MOUTH  TWICE DAILY Crenshaw, Deannie Fabian, MD Taking Active   pantoprazole  (PROTONIX ) 40 MG tablet [id number] Yes Take 1 tablet (40 mg total) by mouth daily. Claire Crick, MD Taking Active   Polyethyl Glycol-Propyl Glycol (SYSTANE) 0.4-0.3 % GEL ophthalmic gel [id number] Yes Place 1 drop into both eyes 2 (two) times daily.  [provider] Taking Active Self  potassium chloride  (KLOR-CON  M) 10 MEQ tablet [id number] Yes TAKE 1 TABLET BY MOUTH TWICE  DAILY Claire Crick, MD Taking Active   RESTASIS  0.05 % ophthalmic emulsion [id number] Yes INSTILL ONE DROP IN BOTH  EYES TWO TIMES DAILY Claire Crick, MD Taking Active Self  thiamine  (VITAMIN B1) 100 MG tablet [id number] Yes Take 1 tablet (100 mg total) by mouth once a week. Claire Crick, MD Taking Active Self           Med Note (CARD, AMY L   Fri Nov 29, 2022  9:41 PM) Kent Pear on Mondays.  traMADol  (ULTRAM ) 50 MG tablet [id number] Yes TAKE 1 TABLET BY MOUTH DAILY AS  NEEDED Romayne Clubs, PA-C Taking Active   traZODone  (DESYREL ) 50 MG tablet [id number] No Take 1 tablet (50 mg total) by mouth at bedtime.  Patient not taking: Reported on 07/22/2023   Alysia Bachelor, MD Not Taking Active   zolpidem  (AMBIEN ) 10 MG tablet [id number] Yes Take 1 tablet (10 mg total) by mouth at bedtime as needed for sleep. Alysia Bachelor, MD Taking Active             Recommendation:   PCP Follow-up  Follow Up Plan:   Telephone follow-up in 1- 1 1/2 month with RN care manager  Verba Girt RN, BSN, CCM [place]  [facility], Population Health Case Manager Phone: [phone]

## 2023-07-22 NOTE — Patient Instructions (Signed)
 Visit Information  Thank you for taking time to visit with me today. Please don't hesitate to contact me if I can be of assistance to you before our next scheduled appointment.  Our next appointment is by telephone on 09/12/23 at 3 pm Please call the care guide team at [phone] if you need to cancel or reschedule your appointment.   Following is a copy of your care plan:   Goals Addressed             This Visit's Progress    COMPLETED: management/ education of health conditions       Interventions Today    Flowsheet Row Most Recent Value  Chronic Disease   Chronic disease during today's visit Congestive Heart Failure (CHF), Other  [sjogrens disease, fibromyalgia]  General Interventions   General Interventions Discussed/Reviewed General Interventions Reviewed, Doctor Visits  [evaluation of current treatment plan for listed health conditions and patients adherence to plan as established by provider. Assessed for HF, sjogrens, fibromyalgia symptoms.]  Doctor Visits Discussed/Reviewed Doctor Visits Reviewed  [reviewed upcoming provider visits. Advised to keep follow up visits with providers as recommended.]  Education Interventions   Education Provided Provided Education  [Assessed pain level on 10 point scale. Advised to stay hyrdrated, manage dry eye and mouth with recommended treatment, practice good dental hygiene, and reduce stress. Assess effectiveness of current treatment for eyes/ pain/ mouth.]  Provided Verbal Education On Other  [reviewed heart failure symptoms. Advised to notify provider for increase in symptoms or call 911 for severe symptoms. Answered questions regarding atrial flutter versus atrial fibrillation. Advised to contact provider for additional questions or concerns]  Pharmacy Interventions   Pharmacy Dicussed/Reviewed Pharmacy Topics Reviewed  [medication list reviewed.  Advised to notify eye doctor that she feels eye treatment is not working well.  Advised to  consider using warm/ moist compress to eyes for increase irritation.]           [facility]BC[facility] RN Care Plan- Fibromyalgia/ peripheral neuropathy       Problems:  Chronic Disease Management support and education needs related to fibromyalgia/ peripheral neuropathy Lacks caregiver support.   Goal: Over the next 3 months the Patient will attend all scheduled medical appointments: with providers as evidenced by patient report/ chart review.         continue to work with Medical illustrator and/or Social Worker to address care management and care coordination needs related to fibromyalgia/ peripheral neur as evidenced by adherence to care management team scheduled appointments     demonstrate Ongoing adherence to prescribed treatment plan for fibromyalgia/ peripheral neuropathy as evidenced by patient report/ chart review.  take all medications exactly as prescribed and will call provider for medication related questions as evidenced by patient report/ chart review.      Interventions:   Evaluation of current treatment plan related to  peripheral neuropathy/ fibromyalgia ,  self-management and patient's adherence to plan as established by provider. Discussed plans with patient for ongoing care management follow up and provided patient with direct contact information for care management team Reviewed medications with patient and discussed patient report/ chart review.  Reviewed scheduled/upcoming provider appointments including   Assessed social determinant of health barriers Advised to contact RN case manager if assistance is needed with coordinating home health PT services.  Fall precautions discussed.  Fall precaution education article sent to patient.   Patient Self-Care Activities:  Attend all scheduled provider appointments Call pharmacy for medication refills 3-7 days in advance of running  out of medications Call provider office for new concerns or questions  Take medications as prescribed    Contact RN if assistance needed with coordinating home health PT services.  Review fall precaution education article and discuss with RN case manager if you have questions   Plan:  Telephone follow up appointment with care management team member scheduled for:  09/12/23 at 10 am          VBCI RN Care Plan- Heart Failure       Problems:  Chronic Disease Management support and education needs related to CHF  Goal: Over the next 3 months the Patient will attend all scheduled medical appointments: with providers as evidenced by patient report/ chart review.         continue to work with Medical illustrator and/or Social Worker to address care management and care coordination needs related to CHF as evidenced by adherence to care management team scheduled appointments     demonstrate Ongoing adherence to prescribed treatment plan for CHF as evidenced by patient report/ chart review.  take all medications exactly as prescribed and will call provider for medication related questions as evidenced by patient report/ chart review.      Interventions:   Heart Failure Interventions: Reviewed Heart Failure Action Plan in depth and provided written copy Assessed need for readable accurate scales in home Advised patient to weigh each morning after emptying bladder Discussed importance of daily weight and advised patient to weigh and record daily Reviewed role of diuretics in prevention of fluid overload and management of heart failure; Discussed the importance of keeping all appointments with provider Assessed social determinant of health barriers   Patient Self-Care Activities:  Attend all scheduled provider appointments Call pharmacy for medication refills 3-7 days in advance of running out of medications Call provider office for new concerns or questions  Take medications as prescribed   call office if I gain more than 2 pounds in one day or 5 pounds in one week track weight in diary use salt  in moderation watch for swelling in feet, ankles and legs every day weigh myself daily follow rescue plan if symptoms flare-up  Plan:  Telephone follow up appointment with care management team member scheduled for:  09/12/23 at 10 am             Please call the Suicide and Crisis Lifeline: 988 call 1-800-273-TALK (toll free, 24 hour hotline) if you are experiencing a Mental Health or Behavioral Health Crisis or need someone to talk to.  Patient verbalizes understanding of instructions and care plan provided today and agrees to view in MyChart. Active MyChart status and patient understanding of how to access instructions and care plan via MyChart confirmed with patient.     Cyncere Ruhe RN, BSN, CCM [place]  [facility], Population Health Case Manager Phone: [phone]  Fall Prevention in the Home, Adult Falls can cause injuries and can happen to people of all ages. There are many things you can do to make your home safer and to help prevent falls. What actions can I take to prevent falls? General information Use good lighting in all rooms. Make sure to: Replace any light bulbs that burn out. Turn on the lights in dark areas and use night-lights. Keep items that you use often in easy-to-reach places. Lower the shelves around your home if needed. Move furniture so that there are clear paths around it. Do not use throw rugs or other things on the  floor that can make you trip. If any of your floors are uneven, fix them. Add color or contrast paint or tape to clearly mark and help you see: Grab bars or handrails. First and last steps of staircases. Where the edge of each step is. If you use a ladder or stepladder: Make sure that it is fully opened. Do not climb a closed ladder. Make sure the sides of the ladder are locked in place. Have someone hold the ladder while you use it. Know where your pets are as you move through your home. What can I do in the  bathroom?     Keep the floor dry. Clean up any water on the floor right away. Remove soap buildup in the bathtub or shower. Buildup makes bathtubs and showers slippery. Use non-skid mats or decals on the floor of the bathtub or shower. Attach bath mats securely with double-sided, non-slip rug tape. If you need to sit down in the shower, use a non-slip stool. Install grab bars by the toilet and in the bathtub and shower. Do not use towel bars as grab bars. What can I do in the bedroom? Make sure that you have a light by your bed that is easy to reach. Do not use any sheets or blankets on your bed that hang to the floor. Have a firm chair or bench with side arms that you can use for support when you get dressed. What can I do in the kitchen? Clean up any spills right away. If you need to reach something above you, use a step stool with a grab bar. Keep electrical cords out of the way. Do not use floor polish or wax that makes floors slippery. What can I do with my stairs? Do not leave anything on the stairs. Make sure that you have a light switch at the top and the bottom of the stairs. Make sure that there are handrails on both sides of the stairs. Fix handrails that are broken or loose. Install non-slip stair treads on all your stairs if they do not have carpet. Avoid having throw rugs at the top or bottom of the stairs. Choose a carpet that does not hide the edge of the steps on the stairs. Make sure that the carpet is firmly attached to the stairs. Fix carpet that is loose or worn. What can I do on the outside of my home? Use bright outdoor lighting. Fix the edges of walkways and driveways and fix any cracks. Clear paths of anything that can make you trip, such as tools or rocks. Add color or contrast paint or tape to clearly mark and help you see anything that might make you trip as you walk through a door, such as a raised step or threshold. Trim any bushes or trees on paths to your  home. Check to see if handrails are loose or broken and that both sides of all steps have handrails. Install guardrails along the edges of any raised decks and porches. Have leaves, snow, or ice cleared regularly. Use sand, salt, or ice melter on paths if you live where there is ice and snow during the winter. Clean up any spills in your garage right away. This includes grease or oil spills. What other actions can I take? Review your medicines with your doctor. Some medicines can cause dizziness or changes in blood pressure, which increase your risk of falling. Wear shoes that: Have a low heel. Do not wear high heels. Have rubber bottoms  and are closed at the toe. Feel good on your feet and fit well. Use tools that help you move around if needed. These include: Canes. Walkers. Scooters. Crutches. Ask your doctor what else you can do to help prevent falls. This may include seeing a physical therapist to learn to do exercises to move better and get stronger. Where to find more information Centers for Disease Control and Prevention, [employer]: TonerPromos.no [employer] on Aging: BaseRingTones.pl National Institute on Aging: BaseRingTones.pl Contact a doctor if: You are afraid of falling at home. You feel weak, drowsy, or dizzy at home. You fall at home. Get help right away if you: Lose consciousness or have trouble moving after a fall. Have a fall that causes a head injury. These symptoms may be an emergency. Get help right away. Call 911. Do not wait to see if the symptoms will go away. Do not drive yourself to the hospital. This information is not intended to replace advice given to you by your health care provider. Make sure you discuss any questions you have with your health care provider. Document Revised: 11/05/2021 Document Reviewed: 11/05/2021 Elsevier Patient Education  2024 [employer].

## 2023-08-02 DIAGNOSIS — M797 Fibromyalgia: Secondary | ICD-10-CM | POA: Diagnosis not present

## 2023-08-02 DIAGNOSIS — K219 Gastro-esophageal reflux disease without esophagitis: Secondary | ICD-10-CM | POA: Diagnosis not present

## 2023-08-02 DIAGNOSIS — I4891 Unspecified atrial fibrillation: Secondary | ICD-10-CM | POA: Diagnosis not present

## 2023-08-02 DIAGNOSIS — G6289 Other specified polyneuropathies: Secondary | ICD-10-CM | POA: Diagnosis not present

## 2023-08-02 DIAGNOSIS — N1832 Chronic kidney disease, stage 3b: Secondary | ICD-10-CM | POA: Diagnosis not present

## 2023-08-02 DIAGNOSIS — Z87442 Personal history of urinary calculi: Secondary | ICD-10-CM | POA: Diagnosis not present

## 2023-08-02 DIAGNOSIS — I5032 Chronic diastolic (congestive) heart failure: Secondary | ICD-10-CM | POA: Diagnosis not present

## 2023-08-02 DIAGNOSIS — Z7901 Long term (current) use of anticoagulants: Secondary | ICD-10-CM | POA: Diagnosis not present

## 2023-08-02 DIAGNOSIS — W19XXXD Unspecified fall, subsequent encounter: Secondary | ICD-10-CM | POA: Diagnosis not present

## 2023-08-02 DIAGNOSIS — E7800 Pure hypercholesterolemia, unspecified: Secondary | ICD-10-CM | POA: Diagnosis not present

## 2023-08-02 DIAGNOSIS — G8929 Other chronic pain: Secondary | ICD-10-CM | POA: Diagnosis not present

## 2023-08-02 DIAGNOSIS — M3501 Sicca syndrome with keratoconjunctivitis: Secondary | ICD-10-CM | POA: Diagnosis not present

## 2023-08-02 DIAGNOSIS — Z951 Presence of aortocoronary bypass graft: Secondary | ICD-10-CM | POA: Diagnosis not present

## 2023-08-02 DIAGNOSIS — I484 Atypical atrial flutter: Secondary | ICD-10-CM | POA: Diagnosis not present

## 2023-08-02 DIAGNOSIS — I130 Hypertensive heart and chronic kidney disease with heart failure and stage 1 through stage 4 chronic kidney disease, or unspecified chronic kidney disease: Secondary | ICD-10-CM | POA: Diagnosis not present

## 2023-08-02 DIAGNOSIS — Z87440 Personal history of urinary (tract) infections: Secondary | ICD-10-CM | POA: Diagnosis not present

## 2023-08-02 DIAGNOSIS — M150 Primary generalized (osteo)arthritis: Secondary | ICD-10-CM | POA: Diagnosis not present

## 2023-08-06 DIAGNOSIS — Z87440 Personal history of urinary (tract) infections: Secondary | ICD-10-CM | POA: Diagnosis not present

## 2023-08-06 DIAGNOSIS — W19XXXD Unspecified fall, subsequent encounter: Secondary | ICD-10-CM | POA: Diagnosis not present

## 2023-08-06 DIAGNOSIS — Z951 Presence of aortocoronary bypass graft: Secondary | ICD-10-CM | POA: Diagnosis not present

## 2023-08-06 DIAGNOSIS — I130 Hypertensive heart and chronic kidney disease with heart failure and stage 1 through stage 4 chronic kidney disease, or unspecified chronic kidney disease: Secondary | ICD-10-CM | POA: Diagnosis not present

## 2023-08-06 DIAGNOSIS — I4891 Unspecified atrial fibrillation: Secondary | ICD-10-CM | POA: Diagnosis not present

## 2023-08-06 DIAGNOSIS — M150 Primary generalized (osteo)arthritis: Secondary | ICD-10-CM | POA: Diagnosis not present

## 2023-08-06 DIAGNOSIS — I484 Atypical atrial flutter: Secondary | ICD-10-CM | POA: Diagnosis not present

## 2023-08-06 DIAGNOSIS — M3501 Sicca syndrome with keratoconjunctivitis: Secondary | ICD-10-CM | POA: Diagnosis not present

## 2023-08-06 DIAGNOSIS — G8929 Other chronic pain: Secondary | ICD-10-CM | POA: Diagnosis not present

## 2023-08-06 DIAGNOSIS — Z87442 Personal history of urinary calculi: Secondary | ICD-10-CM | POA: Diagnosis not present

## 2023-08-06 DIAGNOSIS — I5032 Chronic diastolic (congestive) heart failure: Secondary | ICD-10-CM | POA: Diagnosis not present

## 2023-08-06 DIAGNOSIS — Z7901 Long term (current) use of anticoagulants: Secondary | ICD-10-CM | POA: Diagnosis not present

## 2023-08-06 DIAGNOSIS — M797 Fibromyalgia: Secondary | ICD-10-CM | POA: Diagnosis not present

## 2023-08-06 DIAGNOSIS — G6289 Other specified polyneuropathies: Secondary | ICD-10-CM | POA: Diagnosis not present

## 2023-08-06 DIAGNOSIS — E7800 Pure hypercholesterolemia, unspecified: Secondary | ICD-10-CM | POA: Diagnosis not present

## 2023-08-06 DIAGNOSIS — N1832 Chronic kidney disease, stage 3b: Secondary | ICD-10-CM | POA: Diagnosis not present

## 2023-08-06 DIAGNOSIS — K219 Gastro-esophageal reflux disease without esophagitis: Secondary | ICD-10-CM | POA: Diagnosis not present

## 2023-08-08 DIAGNOSIS — M3501 Sicca syndrome with keratoconjunctivitis: Secondary | ICD-10-CM

## 2023-08-08 DIAGNOSIS — I130 Hypertensive heart and chronic kidney disease with heart failure and stage 1 through stage 4 chronic kidney disease, or unspecified chronic kidney disease: Secondary | ICD-10-CM

## 2023-08-08 DIAGNOSIS — K219 Gastro-esophageal reflux disease without esophagitis: Secondary | ICD-10-CM

## 2023-08-08 DIAGNOSIS — I5032 Chronic diastolic (congestive) heart failure: Secondary | ICD-10-CM

## 2023-08-08 DIAGNOSIS — F419 Anxiety disorder, unspecified: Secondary | ICD-10-CM

## 2023-08-08 DIAGNOSIS — I4891 Unspecified atrial fibrillation: Secondary | ICD-10-CM

## 2023-08-08 DIAGNOSIS — G8929 Other chronic pain: Secondary | ICD-10-CM

## 2023-08-08 DIAGNOSIS — F32A Depression, unspecified: Secondary | ICD-10-CM

## 2023-08-08 DIAGNOSIS — N1832 Chronic kidney disease, stage 3b: Secondary | ICD-10-CM

## 2023-08-08 DIAGNOSIS — I484 Atypical atrial flutter: Secondary | ICD-10-CM

## 2023-08-08 DIAGNOSIS — M797 Fibromyalgia: Secondary | ICD-10-CM

## 2023-08-08 DIAGNOSIS — G6289 Other specified polyneuropathies: Secondary | ICD-10-CM

## 2023-08-12 DIAGNOSIS — H5201 Hypermetropia, right eye: Secondary | ICD-10-CM | POA: Diagnosis not present

## 2023-08-12 DIAGNOSIS — M797 Fibromyalgia: Secondary | ICD-10-CM | POA: Diagnosis not present

## 2023-08-12 DIAGNOSIS — I130 Hypertensive heart and chronic kidney disease with heart failure and stage 1 through stage 4 chronic kidney disease, or unspecified chronic kidney disease: Secondary | ICD-10-CM | POA: Diagnosis not present

## 2023-08-12 DIAGNOSIS — E7800 Pure hypercholesterolemia, unspecified: Secondary | ICD-10-CM | POA: Diagnosis not present

## 2023-08-12 DIAGNOSIS — W19XXXD Unspecified fall, subsequent encounter: Secondary | ICD-10-CM | POA: Diagnosis not present

## 2023-08-12 DIAGNOSIS — Z87440 Personal history of urinary (tract) infections: Secondary | ICD-10-CM | POA: Diagnosis not present

## 2023-08-12 DIAGNOSIS — Z87442 Personal history of urinary calculi: Secondary | ICD-10-CM | POA: Diagnosis not present

## 2023-08-12 DIAGNOSIS — G6289 Other specified polyneuropathies: Secondary | ICD-10-CM | POA: Diagnosis not present

## 2023-08-12 DIAGNOSIS — Z951 Presence of aortocoronary bypass graft: Secondary | ICD-10-CM | POA: Diagnosis not present

## 2023-08-12 DIAGNOSIS — M3501 Sicca syndrome with keratoconjunctivitis: Secondary | ICD-10-CM | POA: Diagnosis not present

## 2023-08-12 DIAGNOSIS — I4891 Unspecified atrial fibrillation: Secondary | ICD-10-CM | POA: Diagnosis not present

## 2023-08-12 DIAGNOSIS — K219 Gastro-esophageal reflux disease without esophagitis: Secondary | ICD-10-CM | POA: Diagnosis not present

## 2023-08-12 DIAGNOSIS — I5032 Chronic diastolic (congestive) heart failure: Secondary | ICD-10-CM | POA: Diagnosis not present

## 2023-08-12 DIAGNOSIS — M150 Primary generalized (osteo)arthritis: Secondary | ICD-10-CM | POA: Diagnosis not present

## 2023-08-12 DIAGNOSIS — I484 Atypical atrial flutter: Secondary | ICD-10-CM | POA: Diagnosis not present

## 2023-08-12 DIAGNOSIS — Z7901 Long term (current) use of anticoagulants: Secondary | ICD-10-CM | POA: Diagnosis not present

## 2023-08-12 DIAGNOSIS — H52203 Unspecified astigmatism, bilateral: Secondary | ICD-10-CM | POA: Diagnosis not present

## 2023-08-12 DIAGNOSIS — N1832 Chronic kidney disease, stage 3b: Secondary | ICD-10-CM | POA: Diagnosis not present

## 2023-08-12 DIAGNOSIS — G8929 Other chronic pain: Secondary | ICD-10-CM | POA: Diagnosis not present

## 2023-08-12 DIAGNOSIS — H16223 Keratoconjunctivitis sicca, not specified as Sjogren's, bilateral: Secondary | ICD-10-CM | POA: Diagnosis not present

## 2023-08-12 DIAGNOSIS — H25813 Combined forms of age-related cataract, bilateral: Secondary | ICD-10-CM | POA: Diagnosis not present

## 2023-08-13 ENCOUNTER — Ambulatory Visit (HOSPITAL_COMMUNITY)
Admission: RE | Admit: 2023-08-13 | Discharge: 2023-08-13 | Disposition: A | Discharge status: Home / Self Care | Source: Ambulatory Visit | Attending: Cardiovascular Disease | Admitting: Cardiovascular Disease

## 2023-08-13 ENCOUNTER — Ambulatory Visit: Payer: Self-pay | Admitting: Cardiology

## 2023-08-13 DIAGNOSIS — I359 Nonrheumatic aortic valve disorder, unspecified: Secondary | ICD-10-CM

## 2023-08-13 LAB — ECHOCARDIOGRAM COMPLETE
AR max vel: 0.82 cm2
AV Area VTI: 0.79 cm2
AV Area mean vel: 0.77 cm2
AV Mean grad: 16 mmHg
AV Peak grad: 25.6 mmHg
Ao pk vel: 2.53 m/s
Area-P 1/2: 1.63 cm2
MV M vel: 3.66 m/s
MV Peak grad: 53.6 mmHg
MV VTI: 0.74 cm2
P 1/2 time: 573 ms
S' Lateral: 2.5 cm

## 2023-08-14 ENCOUNTER — Other Ambulatory Visit: Payer: Self-pay | Admitting: *Deleted

## 2023-08-14 DIAGNOSIS — I359 Nonrheumatic aortic valve disorder, unspecified: Secondary | ICD-10-CM

## 2023-08-14 DIAGNOSIS — Z9889 Other specified postprocedural states: Secondary | ICD-10-CM

## 2023-08-19 DIAGNOSIS — G6289 Other specified polyneuropathies: Secondary | ICD-10-CM | POA: Diagnosis not present

## 2023-08-19 DIAGNOSIS — M150 Primary generalized (osteo)arthritis: Secondary | ICD-10-CM | POA: Diagnosis not present

## 2023-08-19 DIAGNOSIS — K219 Gastro-esophageal reflux disease without esophagitis: Secondary | ICD-10-CM | POA: Diagnosis not present

## 2023-08-19 DIAGNOSIS — Z7901 Long term (current) use of anticoagulants: Secondary | ICD-10-CM | POA: Diagnosis not present

## 2023-08-19 DIAGNOSIS — Z87440 Personal history of urinary (tract) infections: Secondary | ICD-10-CM | POA: Diagnosis not present

## 2023-08-19 DIAGNOSIS — G8929 Other chronic pain: Secondary | ICD-10-CM | POA: Diagnosis not present

## 2023-08-19 DIAGNOSIS — I130 Hypertensive heart and chronic kidney disease with heart failure and stage 1 through stage 4 chronic kidney disease, or unspecified chronic kidney disease: Secondary | ICD-10-CM | POA: Diagnosis not present

## 2023-08-19 DIAGNOSIS — N1832 Chronic kidney disease, stage 3b: Secondary | ICD-10-CM | POA: Diagnosis not present

## 2023-08-19 DIAGNOSIS — I484 Atypical atrial flutter: Secondary | ICD-10-CM | POA: Diagnosis not present

## 2023-08-19 DIAGNOSIS — Z951 Presence of aortocoronary bypass graft: Secondary | ICD-10-CM | POA: Diagnosis not present

## 2023-08-19 DIAGNOSIS — M3501 Sicca syndrome with keratoconjunctivitis: Secondary | ICD-10-CM | POA: Diagnosis not present

## 2023-08-19 DIAGNOSIS — I5032 Chronic diastolic (congestive) heart failure: Secondary | ICD-10-CM | POA: Diagnosis not present

## 2023-08-19 DIAGNOSIS — I4891 Unspecified atrial fibrillation: Secondary | ICD-10-CM | POA: Diagnosis not present

## 2023-08-19 DIAGNOSIS — M797 Fibromyalgia: Secondary | ICD-10-CM | POA: Diagnosis not present

## 2023-08-19 DIAGNOSIS — E7800 Pure hypercholesterolemia, unspecified: Secondary | ICD-10-CM | POA: Diagnosis not present

## 2023-08-19 DIAGNOSIS — Z87442 Personal history of urinary calculi: Secondary | ICD-10-CM | POA: Diagnosis not present

## 2023-08-19 DIAGNOSIS — W19XXXD Unspecified fall, subsequent encounter: Secondary | ICD-10-CM | POA: Diagnosis not present

## 2023-08-20 ENCOUNTER — Other Ambulatory Visit: Payer: Self-pay

## 2023-08-20 ENCOUNTER — Encounter: Payer: Self-pay | Admitting: Cardiology

## 2023-08-20 ENCOUNTER — Encounter (HOSPITAL_COMMUNITY): Payer: Self-pay | Admitting: Psychiatry

## 2023-08-20 ENCOUNTER — Telehealth (INDEPENDENT_AMBULATORY_CARE_PROVIDER_SITE_OTHER): Admitting: Psychiatry

## 2023-08-20 DIAGNOSIS — F411 Generalized anxiety disorder: Secondary | ICD-10-CM

## 2023-08-20 DIAGNOSIS — I5032 Chronic diastolic (congestive) heart failure: Secondary | ICD-10-CM

## 2023-08-20 MED ORDER — FUROSEMIDE 20 MG PO TABS: 40.0000 mg | ORAL_TABLET | Freq: Every day | ORAL | 3 refills | Status: DC

## 2023-08-20 MED ORDER — ALPRAZOLAM 0.5 MG PO TABS: 0.5000 mg | ORAL_TABLET | Freq: Every day | ORAL | 2 refills | Status: DC

## 2023-08-20 MED ORDER — ZOLPIDEM TARTRATE 10 MG PO TABS: 10.0000 mg | ORAL_TABLET | Freq: Every evening | ORAL | 2 refills | Status: DC | PRN

## 2023-08-20 NOTE — Progress Notes (Signed)
 Virtual Visit via Telephone Note  I connected with Alexandria Leach on 08/20/23 at  9:20 AM EDT by telephone and verified that I am speaking with the correct person using two identifiers.  Location: Patient: home Provider: office   I discussed the limitations, risks, security and privacy concerns of performing an evaluation and management service by telephone and the availability of in person appointments. I also discussed with the patient that there may be a patient responsible charge related to this service. The patient expressed understanding and agreed to proceed.       I discussed the assessment and treatment plan with the patient. The patient was provided an opportunity to ask questions and all were answered. The patient agreed with the plan and demonstrated an understanding of the instructions.   The patient was advised to call back or seek an in-person evaluation if the symptoms worsen or if the condition fails to improve as anticipated.  I provided 20 minutes of non-face-to-face time during this encounter.   Alfredia Annas, MD  [facility] MD/PA/NP OP Progress Note  08/20/2023 9:18 AM Sindia Kowalczyk  MRN:  [id number]  Chief Complaint:  Chief Complaint  Patient presents with   Anxiety   Follow-up   HPI:  This patient is a 73 year old separated black female lives alone in [place]. 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 She has 4 sons.   Patient returns for follow-up after 3 months regarding her anxiety and insomnia.  Overall she seems to be doing fairly well.  She is taken a fall which may be multifactorial.  She has neuropathy but is also on some medicines that could cause unsteadiness.  However she cannot stop some of these medicines such as gabapentin  and Toprol .  In terms of anxiety she is doing very well.  She only takes Xanax  once a day.  We tried switching her to trazodone  last visit but it  caused chest pain and she went back to the Ambien .  She states that she is sleeping well.  She denies any symptoms of depression.  She is staying busy and active with friends and her sons Visit Diagnosis:    ICD-10-CM   1. GAD (generalized anxiety disorder)  F41.1       Past Psychiatric History: none  Past Medical History:  Past Medical History:  Diagnosis Date   Ankle fracture, left 02/19/2015   from a fall   Anxiety    Cataract 2014   corrected with surgery   CHF (congestive heart failure) (HCC)    CKD (chronic kidney disease) stage 3, GFR 30-59 ml/min [facility])    saw nephrologist Dr Arlana Labor   Community acquired pneumonia 11/29/2022   Multifocal s/p hospitalization 11/2022     COVID-19 09/21/2021   DDD (degenerative disc disease), cervical    Depression    Fibromyalgia    Glaucoma    s/p surgery, sees ophtho Q6 mo   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 Thought benign.    Osteoarthritis    shoulders  and knees, not RA per Dr Alvira Josephs, positive ANA, positive Ro   Osteopenia 09/18/2015   DEXA T -1.1 hip, -0.2 spine 08/2015    Personal history of urinary calculi latest 2014   Pneumonia 12/02/2011   PONV (postoperative nausea and vomiting)    Refusal of blood transfusions as patient is Jehovah's Witness    Rheumatic heart disease 1980   s/p mitral valve repair 1980   Sjogren's syndrome (HCC)    Trimalleolar fracture of left ankle 02/23/2015    Past Surgical History:  Procedure Laterality Date   BREAST BIOPSY Right 2006   benign   CARDIOVERSION N/A 07/18/2020   Procedure: CARDIOVERSION;  Surgeon: Euell Herrlich, MD;   Location: [facility] ENDOSCOPY;  Service: Cardiovascular;  Laterality: N/A;   CHOLECYSTECTOMY  11/27/2011   Procedure: LAPAROSCOPIC CHOLECYSTECTOMY WITH INTRAOPERATIVE CHOLANGIOGRAM;  Surgeon: Levert Ready, MD;  Location: [facility] OR;  Service: General;  Laterality: N/A;  laparoscopic cholecystectomy with choleangiogram umbilical hernia repair   COLONOSCOPY  07/2014   WNL Sabra Cramp)   COLONOSCOPY  08/2019   3 TAs, rpt 3 yrs ([place])   dexa  08/2012   normal per patient - no records available   ESOPHAGOGASTRODUODENOSCOPY  08/2019   reactive gastropathy, neg H pylori ([place])   EYE SURGERY Bilateral 2014   cataract removal   MITRAL VALVE REPAIR  1980   open heart   ORIF ANKLE FRACTURE Left 02/26/2015   Procedure: OPEN REDUCTION INTERNAL FIXATION (ORIF) LEFT TRIMALLEOLAR ANKLE FRACTURE;  Surgeon: Wes Hamman, MD;  Location: [facility] OR;  Service: Orthopedics;  Laterality: Left;   TUBAL LIGATION  1980   UMBILICAL HERNIA REPAIR  11/27/2011   Procedure: HERNIA REPAIR UMBILICAL ADULT;  Surgeon: Levert Ready, MD;  Location: [facility] OR;  Service: General;  Laterality: N/A;   VAGINAL HYSTERECTOMY  1992   for fibroids -- partial, ovaries remain    Family Psychiatric History: See below  Family History:  Family History  Problem Relation Age of Onset   Cancer Mother        lung (nonsmoker)   CAD Mother        MI in her 56s   ALS Mother    Kidney disease Father    Alcohol  abuse Father    Diabetes Father    Lupus Sister        and niece   Diabetes Sister    Stroke Sister    Depression Sister    Lupus Sister    Blindness Sister    Cancer Maternal Uncle        bone   Stroke Maternal Grandmother    Cancer Brother        bone   Diabetes Brother    Heart attack Brother    Healthy Son    Healthy Son    Healthy Son    Healthy Son    Kidney failure Other        on HD   Breast cancer Neg Hx     Social History:  Social History   Socioeconomic History   Marital status: Married    Spouse name:  Charlann Confer   Number of children: 4   Years of education: 12   Highest education level: 12th grade  Occupational History    Employer: UNEMPLOYED    Comment: Disability  Tobacco Use   Smoking status: Never    Passive exposure: Past   Smokeless tobacco: Never   Tobacco comments:    Never smoke 08/29/21  Vaping  Use   Vaping status: Never Used  Substance and Sexual Activity   Alcohol  use: No    Alcohol /week: 0.0 standard drinks of alcohol    Drug use: No   Sexual activity: Not Currently    Birth control/protection: Surgical  Other Topics Concern   Not on file  Social History Narrative   Lives with son, 1 dog   Occupation: unemployed, on disability for fibromyalgia since 2008.   Edu: HS   Religion: Jehova's witness   Activity: volunteers at senior center   Diet: some water, fruits/vegetables daily   No caffeine use   Social Drivers of Corporate investment banker Strain: Low Risk  (07/15/2023)   Overall Financial Resource Strain (CARDIA)    Difficulty of Paying Living Expenses: Not hard at all  Food Insecurity: No Food Insecurity (07/22/2023)   Hunger Vital Sign    Worried About Running Out of Food in the Last Year: Never true    Ran Out of Food in the Last Year: Never true  Transportation Needs: No Transportation Needs (07/22/2023)   PRAPARE - Administrator, Civil Service (Medical): No    Lack of Transportation (Non-Medical): No  Physical Activity: Sufficiently Active (07/15/2023)   Exercise Vital Sign    Days of Exercise per Week: 3 days    Minutes of Exercise per Session: 60 min  Stress: No Stress Concern Present (07/15/2023)   [employer] of Occupational Health - Occupational Stress Questionnaire    Feeling of Stress : Not at all  Social Connections: Socially Integrated (07/15/2023)   Social Connection and Isolation Panel [NHANES]    Frequency of Communication with Friends and Family: More than three times a week    Frequency of Social Gatherings with Friends  and Family: Twice a week    Attends Religious Services: 1 to 4 times per year    Active Member of [employer] or Organizations: Yes    Attends Banker Meetings: 1 to 4 times per year    Marital Status: Married    Allergies:  Allergies  Allergen Reactions   Cymbalta [Duloxetine Hcl] Other (See Comments)    tachycardia   Trazodone  Other (See Comments)    Patient states had chest pain, face pain and heart rate increased   Statins Nausea Only and Other (See Comments)    Muscle cramps also   Sulfa Antibiotics Nausea And Vomiting    Metabolic Disorder Labs: Lab Results  Component Value Date   HGBA1C 6.1 01/07/2023   MPG 126 01/12/2021   MPG 114 10/13/2015   No results found for: "PROLACTIN" Lab Results  Component Value Date   CHOL 222 (H) 07/08/2023   TRIG 93 07/08/2023   HDL 65 07/08/2023   CHOLHDL 3.4 07/08/2023   VLDL 16.1 01/07/2023   LDLCALC 141 (H) 07/08/2023   LDLCALC 137 (H) 01/07/2023   Lab Results  Component Value Date   TSH 4.00 01/07/2023   TSH 2.00 01/04/2022    Therapeutic Level Labs: No results found for: "LITHIUM" No results found for: "VALPROATE" No results found for: "CBMZ"  Current Medications: Current Outpatient Medications  Medication Sig Dispense Refill   ALPRAZolam  (XANAX ) 0.5 MG tablet Take 1 tablet (0.5 mg total) by mouth daily. 30 tablet 2   amoxicillin  (AMOXIL ) 500 MG tablet Take 4 tablets 1 hour prior to dental work 4 tablet 6   antiseptic oral rinse (BIOTENE) LIQD 15 mLs by Mouth Rinse route 2 (two) times daily as needed for dry mouth.  apixaban  (ELIQUIS ) 5 MG TABS tablet TAKE 1 TABLET BY MOUTH TWICE  DAILY 200 tablet 1   cholecalciferol  (VITAMIN D ) 1000 UNITS tablet Take 1,000 Units by mouth daily.     cyanocobalamin  (VITAMIN B12) 1000 MCG tablet Take 1 tablet (1,000 mcg total) by mouth once a week.     diltiazem  (CARDIZEM  CD) 360 MG 24 hr capsule TAKE 1 CAPSULE BY MOUTH DAILY 90 capsule 3   docusate sodium  (COLACE) 100 MG  capsule Take 100 mg by mouth daily.     dofetilide  (TIKOSYN ) 250 MCG capsule Take 1 capsule (250 mcg total) by mouth 2 (two) times daily. 180 capsule 2   ezetimibe  (ZETIA ) 10 MG tablet Take 1 tablet (10 mg total) by mouth at bedtime. 100 tablet 3   furosemide  (LASIX ) 20 MG tablet Take 2 tablets (40 mg total) by mouth daily. 60 tablet 0   gabapentin  (NEURONTIN ) 300 MG capsule TAKE 2 CAPSULES BY MOUTH IN THE MORNING 1 CAPSULE BY  MOUTH IN THE AFTERNOON AND  2 CAPSULES BY MOUTH AT  BEDTIME 500 capsule 3   loratadine  (CLARITIN ) 10 MG tablet Take 1 tablet (10 mg total) by mouth daily. 90 tablet 3   losartan  (COZAAR ) 100 MG tablet Take 0.5 tablets (50 mg total) by mouth daily. 50 tablet 3   magnesium  oxide (MAG-OX) 400 (240 Mg) MG tablet Take 1 tablet (400 mg total) by mouth daily.     metoprolol  tartrate (LOPRESSOR ) 50 MG tablet TAKE 1 AND 1/2 TABLETS BY MOUTH  TWICE DAILY 300 tablet 3   pantoprazole  (PROTONIX ) 40 MG tablet Take 1 tablet (40 mg total) by mouth daily. 100 tablet 3   Polyethyl Glycol-Propyl Glycol (SYSTANE) 0.4-0.3 % GEL ophthalmic gel Place 1 drop into both eyes 2 (two) times daily.      potassium chloride  (KLOR-CON  M) 10 MEQ tablet TAKE 1 TABLET BY MOUTH TWICE  DAILY 200 tablet 2   RESTASIS  0.05 % ophthalmic emulsion INSTILL ONE DROP IN BOTH  EYES TWO TIMES DAILY 180 each 0   thiamine  (VITAMIN B1) 100 MG tablet Take 1 tablet (100 mg total) by mouth once a week.     traMADol  (ULTRAM ) 50 MG tablet TAKE 1 TABLET BY MOUTH DAILY AS  NEEDED 30 tablet 0   zolpidem  (AMBIEN ) 10 MG tablet Take 1 tablet (10 mg total) by mouth at bedtime as needed for sleep. 30 tablet 2   No current facility-administered medications for this visit.     Musculoskeletal: Strength & Muscle Tone: na Gait & Station: na Patient leans: N/A  Psychiatric Specialty Exam: Review of Systems  Musculoskeletal:  Positive for gait problem.  All other systems reviewed and are negative.   There were no vitals taken for  this visit.There is no height or weight on file to calculate BMI.  General Appearance: NA  Eye Contact:  NA  Speech:  Clear and Coherent  Volume:  Normal  Mood:  Euthymic  Affect:  NA  Thought Process:  Goal Directed  Orientation:  Full (Time, Place, and Person)  Thought Content: WDL   Suicidal Thoughts:  No  Homicidal Thoughts:  No  Memory:  Immediate;   Good Recent;   Good Remote;   NA  Judgement:  Good  Insight:  Good  Psychomotor Activity:  Normal  Concentration:  Concentration: Good and Attention Span: Good  Recall:  Good  Fund of Knowledge: Good  Language: Good  Akathisia:  No  Handed:  Right  AIMS (if indicated): not  done  Assets:  Communication Skills Desire for Improvement Resilience Social Support Talents/Skills  ADL's:  Intact  Cognition: WNL  Sleep:  Good   Screenings: GAD-7    Garment/textile technologist Visit from 07[facility]21/2023 in [facility] Visit from 01/20/2023 in [facility] Visit from 12/18/2022 in [facility] [facility] Office Visit from 07/15/2022 in [facility] [place] [facility] Office Visit from 01/11/2022 in [facility]  Total GAD-7 Score 0 0 0 0 1      Mini-Mental    Flowsheet Row Clinical Support from 12/11/2017 in [facility] [place] [facility] Clinical Support from 12[facility]09/2016 in [facility] Clinical Support from 11/30/2015 in [facility] [place] [facility]  Total Score (max 30 points ) 20 19 18       PHQ2-9    Flowsheet Row Care Coordination from 07/22/2023 in [place] [facility] Office Visit from 07/21/2023 in [facility] Office Visit from 01/20/2023 in [facility] [place] [facility] Visit from 12/18/2022 in [facility] at [facility] Office  Visit from 07/15/2022 in [facility]  PHQ-2 Total Score 0 0 0 0 0  PHQ-9 Total Score -- 1 2 0 3      Flowsheet Row ED to Hosp-Admission (Discharged) from 11/29/2022 in [facility] 3E HF PCU UC from 02/09/2022 in [facility] Video Visit from 11/21/2021 in [facility] [place]  C-SSRS RISK CATEGORY No Risk No Risk No Risk        Assessment and Plan: This patient is a 73 year old female with a history of depression anxiety congestive heart failure Sjogren syndrome.  She denies depression and her anxiety is well-controlled.  She will continue Xanax  0.5 mg only in the morning and Ambien  10 mg at bedtime for sleep.  She will return to see me in 3 months  Collaboration of Care: Collaboration of Care: Primary Care Provider AEB notes are shared with PCP on the epic system  Patient/Guardian was advised Release of Information must be obtained prior to any record release in order to collaborate their care with an outside provider. Patient/Guardian was advised if they have not already done so to contact the registration department to sign all necessary forms in order for us  to release information regarding their care.   Consent: Patient/Guardian gives verbal consent for treatment and assignment of benefits for services provided during this visit. Patient/Guardian expressed understanding and agreed to proceed.    Alfredia Annas, MD 08/20/2023, 9:18 AM

## 2023-08-21 ENCOUNTER — Other Ambulatory Visit: Payer: Self-pay

## 2023-08-21 ENCOUNTER — Other Ambulatory Visit (HOSPITAL_COMMUNITY): Payer: Self-pay | Admitting: *Deleted

## 2023-08-21 DIAGNOSIS — I5032 Chronic diastolic (congestive) heart failure: Secondary | ICD-10-CM

## 2023-08-21 MED ORDER — DOFETILIDE 250 MCG PO CAPS: 250.0000 ug | ORAL_CAPSULE | Freq: Two times a day (BID) | ORAL | 1 refills | Status: DC

## 2023-08-21 MED ORDER — FUROSEMIDE 20 MG PO TABS
40.0000 mg | ORAL_TABLET | Freq: Every day | ORAL | 3 refills | Status: DC
Start: 2023-08-21 — End: 2023-11-14

## 2023-08-25 DIAGNOSIS — M797 Fibromyalgia: Secondary | ICD-10-CM | POA: Diagnosis not present

## 2023-08-25 DIAGNOSIS — M150 Primary generalized (osteo)arthritis: Secondary | ICD-10-CM | POA: Diagnosis not present

## 2023-08-25 DIAGNOSIS — G6289 Other specified polyneuropathies: Secondary | ICD-10-CM | POA: Diagnosis not present

## 2023-08-25 DIAGNOSIS — I130 Hypertensive heart and chronic kidney disease with heart failure and stage 1 through stage 4 chronic kidney disease, or unspecified chronic kidney disease: Secondary | ICD-10-CM | POA: Diagnosis not present

## 2023-08-25 DIAGNOSIS — K219 Gastro-esophageal reflux disease without esophagitis: Secondary | ICD-10-CM | POA: Diagnosis not present

## 2023-08-25 DIAGNOSIS — Z951 Presence of aortocoronary bypass graft: Secondary | ICD-10-CM | POA: Diagnosis not present

## 2023-08-25 DIAGNOSIS — E7800 Pure hypercholesterolemia, unspecified: Secondary | ICD-10-CM | POA: Diagnosis not present

## 2023-08-25 DIAGNOSIS — N1832 Chronic kidney disease, stage 3b: Secondary | ICD-10-CM | POA: Diagnosis not present

## 2023-08-25 DIAGNOSIS — Z87442 Personal history of urinary calculi: Secondary | ICD-10-CM | POA: Diagnosis not present

## 2023-08-25 DIAGNOSIS — G8929 Other chronic pain: Secondary | ICD-10-CM | POA: Diagnosis not present

## 2023-08-25 DIAGNOSIS — M3501 Sicca syndrome with keratoconjunctivitis: Secondary | ICD-10-CM | POA: Diagnosis not present

## 2023-08-25 DIAGNOSIS — W19XXXD Unspecified fall, subsequent encounter: Secondary | ICD-10-CM | POA: Diagnosis not present

## 2023-08-25 DIAGNOSIS — I484 Atypical atrial flutter: Secondary | ICD-10-CM | POA: Diagnosis not present

## 2023-08-25 DIAGNOSIS — Z87440 Personal history of urinary (tract) infections: Secondary | ICD-10-CM | POA: Diagnosis not present

## 2023-08-25 DIAGNOSIS — Z7901 Long term (current) use of anticoagulants: Secondary | ICD-10-CM | POA: Diagnosis not present

## 2023-08-25 DIAGNOSIS — I5032 Chronic diastolic (congestive) heart failure: Secondary | ICD-10-CM | POA: Diagnosis not present

## 2023-08-25 DIAGNOSIS — I4891 Unspecified atrial fibrillation: Secondary | ICD-10-CM | POA: Diagnosis not present

## 2023-08-26 DIAGNOSIS — I5032 Chronic diastolic (congestive) heart failure: Secondary | ICD-10-CM | POA: Diagnosis not present

## 2023-08-26 DIAGNOSIS — I4891 Unspecified atrial fibrillation: Secondary | ICD-10-CM | POA: Diagnosis not present

## 2023-08-26 DIAGNOSIS — M797 Fibromyalgia: Secondary | ICD-10-CM | POA: Diagnosis not present

## 2023-08-26 DIAGNOSIS — G8929 Other chronic pain: Secondary | ICD-10-CM | POA: Diagnosis not present

## 2023-08-26 DIAGNOSIS — I484 Atypical atrial flutter: Secondary | ICD-10-CM | POA: Diagnosis not present

## 2023-08-26 DIAGNOSIS — Z87442 Personal history of urinary calculi: Secondary | ICD-10-CM | POA: Diagnosis not present

## 2023-08-26 DIAGNOSIS — N1832 Chronic kidney disease, stage 3b: Secondary | ICD-10-CM | POA: Diagnosis not present

## 2023-08-26 DIAGNOSIS — W19XXXD Unspecified fall, subsequent encounter: Secondary | ICD-10-CM | POA: Diagnosis not present

## 2023-08-26 DIAGNOSIS — E7800 Pure hypercholesterolemia, unspecified: Secondary | ICD-10-CM | POA: Diagnosis not present

## 2023-08-26 DIAGNOSIS — Z87440 Personal history of urinary (tract) infections: Secondary | ICD-10-CM | POA: Diagnosis not present

## 2023-08-26 DIAGNOSIS — M150 Primary generalized (osteo)arthritis: Secondary | ICD-10-CM | POA: Diagnosis not present

## 2023-08-26 DIAGNOSIS — G6289 Other specified polyneuropathies: Secondary | ICD-10-CM | POA: Diagnosis not present

## 2023-08-26 DIAGNOSIS — M3501 Sicca syndrome with keratoconjunctivitis: Secondary | ICD-10-CM | POA: Diagnosis not present

## 2023-08-26 DIAGNOSIS — I130 Hypertensive heart and chronic kidney disease with heart failure and stage 1 through stage 4 chronic kidney disease, or unspecified chronic kidney disease: Secondary | ICD-10-CM | POA: Diagnosis not present

## 2023-08-26 DIAGNOSIS — K219 Gastro-esophageal reflux disease without esophagitis: Secondary | ICD-10-CM | POA: Diagnosis not present

## 2023-08-26 DIAGNOSIS — Z951 Presence of aortocoronary bypass graft: Secondary | ICD-10-CM | POA: Diagnosis not present

## 2023-08-26 DIAGNOSIS — Z7901 Long term (current) use of anticoagulants: Secondary | ICD-10-CM | POA: Diagnosis not present

## 2023-08-28 NOTE — Progress Notes (Unsigned)
 Office Visit Note  Patient: Alexandria Leach             Date of Birth: September 10, 1950           MRN: [id number]             PCP: Rilla Baller, MD Referring: Rilla Baller, MD Visit Date: 09/11/2023 Occupation: @GUAROCC @  Subjective:  Chronic pain  History of Present Illness: Alexandria Leach is a 73 y.o. female with history of sjogren's syndrome and osteoarthritis. She is not currently taking an immunosuppressive agent.   Patient continues to have chronic sicca symptoms.  She has been switched to use eyedrop eyedrops that she has been using twice daily which she has found to be helpful.  She is also been using Biotene products to help with mouth dryness.  She continues to see the dentist every 6 months and ophthalmology on a yearly basis.  Patient states that she is no longer taking tramadol  for pain management.  She was experiencing GI side effects while taking tramadol  and has discontinued.  Her pain levels have been inadequately controlled.  She takes Tylenol  as needed but has had persistent discomfort in her right shoulder and the right hip consistent with trochanter bursitis.  Patient states that she has been working with physical therapy to improve her balance as well as Occupational Therapy for upper extremity strengthening.  Activities of Daily Living:  Patient reports morning stiffness lasting several hours  Patient Reports nocturnal pain.  Difficulty dressing/grooming: Denies Difficulty climbing stairs: Reports Difficulty getting out of chair: Reports Difficulty using hands for taps, buttons, cutlery, and/or writing: Reports  Review of Systems  Constitutional:  Negative for fatigue.  HENT:  Positive for mouth dryness. Negative for mouth sores.   Eyes:  Positive for dryness.  Respiratory:  Positive for shortness of breath.   Cardiovascular:  Negative for chest pain and palpitations.  Gastrointestinal:  Negative for blood in stool, constipation and diarrhea.   Endocrine: Negative for increased urination.  Genitourinary:  Negative for involuntary urination.  Musculoskeletal:  Positive for joint pain, gait problem, joint pain, myalgias, muscle weakness, morning stiffness, muscle tenderness and myalgias. Negative for joint swelling.  Skin:  Negative for color change, rash, hair loss and sensitivity to sunlight.  Allergic/Immunologic: Negative for susceptible to infections.  Neurological:  Negative for dizziness and headaches.  Hematological:  Negative for swollen glands.  Psychiatric/Behavioral:  Negative for depressed mood and sleep disturbance. The patient is not nervous/anxious.     PMFS History:  Patient Active Problem List   Diagnosis Date Noted   Chest wall pain 09/10/2023   Nausea 09/09/2023   Hyperglycemia 09/09/2023   Acute renal failure (HCC) 09/09/2023   Fall at home 07/21/2023   Myalgia due to statin 01/20/2023   Thrombophlebitis of left arm 12/18/2022   Leg pain, bilateral 01/11/2022   Chronic pain of right knee 07/11/2021   Chronic GERD 01/30/2021   History of adenomatous polyp of colon 01/30/2021   Vitamin B1 deficiency 01/18/2021   Low serum vitamin B12 01/13/2021   Secondary hypercoagulable state (HCC) 10/23/2020   Prediabetes 07/11/2020   Snoring 06/23/2020   Atrial flutter (HCC) 06/23/2020   Dysphagia 05/15/2020   Secondary hyperparathyroidism of renal origin (HCC) 12/28/2019   Constipation 12/28/2019   Sinus tachycardia 05/27/2018   Transient alteration of awareness 04/04/2018   Abnormal serum thyroid  stimulating hormone (TSH) level 01/30/2018   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/22/2017   Thyroid  nodule 09/16/2017   Sialoadenitis of submandibular  gland 10/29/2016   High risk medication use 06/05/2016   Asymmetrical hearing loss 03/25/2016   Peripheral neuropathy 01/30/2016   DNR no code (do not resuscitate) 12/08/2015   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   TIA (transient ischemic attack)  10/12/2015   Osteopenia 09/18/2015   Right arm pain 08/29/2015   Right sided sciatica 02/21/2015   Imbalance 01/12/2015   Transaminitis 12/24/2014   DDD (degenerative disc disease), cervical    Health maintenance examination 10/18/2014   Advanced care planning/counseling discussion 10/18/2014   Medicare annual wellness visit, subsequent 10/18/2014   Refusal of blood product    Sjogren's syndrome (HCC)    CKD (chronic kidney disease) stage 3, GFR 30-59 ml/min (HCC)    Glaucoma    Fibromyalgia    Osteoarthritis    History of pulmonary embolism    Lung nodule 09/22/2012   (HFpEF) heart failure with preserved ejection fraction (HCC) 12/04/2011   Aortic stenosis 04/22/2011   Palpitations 08/10/2010   HOT FLASHES 06/28/2008   GAD (generalized anxiety disorder) 03/28/2006   History of total abdominal hysterectomy 03/28/2006   HYPERCHOLESTEROLEMIA 12/31/2005   MDD (major depressive disorder), recurrent episode (HCC) 12/31/2005   Essential hypertension 12/31/2005   Chronic insomnia 12/31/2005   History of mitral valve repair 1980    Past Medical History:  Diagnosis Date   Ankle fracture, left 02/19/2015   from a fall   Anxiety    Cataract 2014   corrected with surgery   CHF (congestive heart failure) (HCC)    CKD (chronic kidney disease) stage 3, GFR 30-59 ml/min [facility])    saw nephrologist Dr Juline   Community acquired pneumonia 11/29/2022   Multifocal s/p hospitalization 11/2022     COVID-19 09/21/2021   DDD (degenerative disc disease), cervical    Depression    Fibromyalgia    Glaucoma    s/p surgery, sees ophtho Q6 mo   History of DVT (deep vein thrombosis) several times latest 2012   receives coumadin while hospitalized   History of kidney stones 2010   History of pulmonary embolism 2001, 2006   completed coumadin courses   History of rheumatic fever x3   HLD (hyperlipidemia)    HTN (hypertension)    Insomnia    Low serum vitamin B12 01/13/2021   Lung nodule  09/22/2012   RLL - 6mm, stable since 2014. Thought benign.    Osteoarthritis    shoulders and knees, not RA per Dr Dolphus, positive ANA, positive Ro   Osteopenia 09/18/2015   DEXA T -1.1 hip, -0.2 spine 08/2015    Personal history of urinary calculi latest 2014   Pneumonia 12/02/2011   PONV (postoperative nausea and vomiting)    Refusal of blood transfusions as patient is Jehovah's Witness    Rheumatic heart disease 1980   s/p mitral valve repair 1980   Sjogren's syndrome (HCC)    Trimalleolar fracture of left ankle 02/23/2015    Family History  Problem Relation Age of Onset   Cancer Mother        lung (nonsmoker)   CAD Mother        MI in her 103s   ALS Mother    Kidney disease Father    Alcohol  abuse Father    Diabetes Father    Lupus Sister        and niece   Diabetes Sister    Stroke Sister    Depression Sister    Lupus Sister    Blindness Sister  Cancer Maternal Uncle        bone   Stroke Maternal Grandmother    Cancer Brother        bone   Diabetes Brother    Heart attack Brother    Healthy Son    Healthy Son    Healthy Son    Healthy Son    Kidney failure Other        on HD   Breast cancer Neg Hx    Past Surgical History:  Procedure Laterality Date   BREAST BIOPSY Right 2006   benign   CARDIOVERSION N/A 07/18/2020   Procedure: CARDIOVERSION;  Surgeon: Loni Soyla LABOR, MD;  Location: [facility] ENDOSCOPY;  Service: Cardiovascular;  Laterality: N/A;   CHOLECYSTECTOMY  11/27/2011   Procedure: LAPAROSCOPIC CHOLECYSTECTOMY WITH INTRAOPERATIVE CHOLANGIOGRAM;  Surgeon: Elon CHRISTELLA Pacini, MD;  Location: [facility] OR;  Service: General;  Laterality: N/A;  laparoscopic cholecystectomy with choleangiogram umbilical hernia repair   COLONOSCOPY  07/2014   WNL Kristen)   COLONOSCOPY  08/2019   3 TAs, rpt 3 yrs ([place]rahmbhatt)   dexa  08/2012   normal per patient - no records available   ESOPHAGOGASTRODUODENOSCOPY  08/2019   reactive gastropathy, neg H pylori ([place]rahmbhatt)   EYE  SURGERY Bilateral 2014   cataract removal   MITRAL VALVE REPAIR  1980   open heart   ORIF ANKLE FRACTURE Left 02/26/2015   Procedure: OPEN REDUCTION INTERNAL FIXATION (ORIF) LEFT TRIMALLEOLAR ANKLE FRACTURE;  Surgeon: Kay CHRISTELLA Cummins, MD;  Location: [facility] OR;  Service: Orthopedics;  Laterality: Left;   TUBAL LIGATION  1980   UMBILICAL HERNIA REPAIR  11/27/2011   Procedure: HERNIA REPAIR UMBILICAL ADULT;  Surgeon: Elon CHRISTELLA Pacini, MD;  Location[facility] OR;  Service: General;  Laterality: N/A;   VAGINAL HYSTERECTOMY  1992   for fibroids -- partial, ovaries remain   Social History   Social History Narrative   Lives with son, 1 dog   Occupation: unemployed, on disability for fibromyalgia since 2008.   Edu: HS   Religion: Jehova's witness   Activity: volunteers at senior center   Diet: some water, fruits/vegetables daily   No caffeine use   Immunization History  Administered Date(s) Administered   PFIZER(Purple Top)SARS-COV-2 Vaccination 05/12/2019, 06/09/2019, 01/06/2020   Pfizer Covid-19 Vaccine Bivalent Booster 37yrs & up 02/13/2021   Pfizer(Comirnaty)Fall Seasonal Vaccine 12 years and older 12/25/2021, 12/19/2022   Pneumococcal Conjugate-13 12/15/2017   Pneumococcal Polysaccharide-23 07/22/2012, 09/15/2012, 12/22/2018   Td (Adult),unspecified 11/27/2011     Objective: Vital Signs: BP 131/81 (BP Location: Left Arm, Patient Position: Sitting, Cuff Size: Normal)   Pulse 64   Resp 16   Ht 5' 1 (1.549 m)   Wt 189 lb 9.6 oz (86 kg)   BMI 35.82 kg/m    Physical Exam Vitals and nursing note reviewed.  Constitutional:      Appearance: She is well-developed.  HENT:     Head: Normocephalic and atraumatic.   Eyes:     Conjunctiva/sclera: Conjunctivae normal.    Cardiovascular:     Rate and Rhythm: Normal rate and regular rhythm.     Heart sounds: Murmur heard.  Pulmonary:     Effort: Pulmonary effort is normal.     Breath sounds: Normal breath sounds.  Abdominal:     General:  Bowel sounds are normal.     Palpations: Abdomen is soft.   Musculoskeletal:     Cervical back: Normal range of motion.  Lymphadenopathy:     Cervical: No  cervical adenopathy.   Skin:    General: Skin is warm and dry.     Capillary Refill: Capillary refill takes less than 2 seconds.   Neurological:     Mental Status: She is alert and oriented to person, place, and time.   Psychiatric:        Behavior: Behavior normal.      Musculoskeletal Exam: Generalized hyperalgesia and positive tender points on exam.  C-spine has good range of motion.  Slightly limited mobility of the lumbar spine.  Discomfort range of motion of both shoulders especially the right shoulder.  Elbow joints, wrist joints, MCPs, PIPs, DIPs have good range of motion with no synovitis.  PIP and DIP thickening consistent with osteoarthritis of both hands.  Complete fist formation bilaterally.  Hip joints have good range of motion with some discomfort of the right hip.  Tenderness over the right trochanteric bursa.  Knee joints have good range of motion with no warmth or effusion.  Ankle joints have good range of motion with no tenderness or joint swelling.  CDAI Exam: CDAI Score: -- Patient Global: --; Provider Global: -- Swollen: --; Tender: -- Joint Exam 09/11/2023   No joint exam has been documented for this visit   There is currently no information documented on the homunculus. Go to the Rheumatology activity and complete the homunculus joint exam.  Investigation: No additional findings.  Imaging: DG Ribs Bilateral W/Chest Result Date: 09/07/2023 CLINICAL DATA:  Chest pain, rib pain. EXAM: BILATERAL RIBS AND CHEST - 4 VIEW COMPARISON:  02/03/2023. FINDINGS: No fracture or other bone lesions are seen involving the ribs. There is no evidence of pneumothorax or pleural effusion. Both lungs are clear. Cardiac silhouette is prominent. Status post median sternotomy. IMPRESSION: No rib fractures identified.  Electronically Signed   By: Fonda Field M.D.   On: 09/07/2023 09:41   ECHOCARDIOGRAM COMPLETE Result Date: 08/13/2023    ECHOCARDIOGRAM REPORT   Patient Name:   Kourtnie HERBIN [facility] Date of Exam: 08/13/2023 Medical Rec #:  [id number]             Height:       61.0 in Accession #:    [id number]            Weight:       189.0 lb Date of Birth:  [birth year]              BSA:          1.844 m Patient Age:    73 years              BP:           161/87 mmHg Patient Gender: F                     HR:           62 bpm. Exam Location:  [address] Procedure: 2D Echo, Cardiac Doppler, Color Doppler and Strain Analysis (Both            Spectral and Color Flow Doppler were utilized during procedure). Indications:    I35.0 Aortic Stenosis  History:        Patient has prior history of Echocardiogram examinations, most                 recent 01/08/2022. CHF, TIA and CKD, DVT; Arrythmias:Atrial                 Flutter.  Mitral Valve: valve is present in the mitral position. Procedure                 Date: 23.  Sonographer:    Waldo Guadalajara RCS Referring Phys: [id number] BRIAN S CRENSHAW IMPRESSIONS  1. Left ventricular ejection fraction, by estimation, is 60 to 65%. The left ventricle has normal function. The left ventricle has no regional wall motion abnormalities. Left ventricular diastolic parameters are consistent with Grade II diastolic dysfunction (pseudonormalization). The average left ventricular global longitudinal strain is -20.0 %. The global longitudinal strain is normal.  2. Right ventricular systolic function is normal. The right ventricular size is normal. There is mildly elevated pulmonary artery systolic pressure. The estimated right ventricular systolic pressure is 41.9 mmHg.  3. Left atrial size was mild to moderately dilated.  4. Status post mitral valve repair in setting of rheumatic MV disease. The anterior leaflet is calcified and mildly thickened, both leaflets show decreased excursion.  Mean gradient 10 mmHg. At least moderate mitral stenosis with MVA 1.63 cm^2 by PHT and  0.74 cm^2 by VTI. Mild mitral valve regurgitation.  5. The aortic valve is tricuspid. There is moderate calcification of the aortic valve. Aortic valve regurgitation is mild. Mild aortic valve stenosis. Aortic valve mean gradient measures 16.0 mmHg.  6. The inferior vena cava is normal in size with greater than 50% respiratory variability, suggesting right atrial pressure of 3 mmHg. FINDINGS  Left Ventricle: Left ventricular ejection fraction, by estimation, is 60 to 65%. The left ventricle has normal function. The left ventricle has no regional wall motion abnormalities. The average left ventricular global longitudinal strain is -20.0 %. Strain was performed and the global longitudinal strain is normal. The left ventricular internal cavity size was normal in size. There is no left ventricular hypertrophy. Left ventricular diastolic parameters are consistent with Grade II diastolic dysfunction (pseudonormalization). Right Ventricle: The right ventricular size is normal. No increase in right ventricular wall thickness. Right ventricular systolic function is normal. There is mildly elevated pulmonary artery systolic pressure. The tricuspid regurgitant velocity is 3.12  m/s, and with an assumed right atrial pressure of 3 mmHg, the estimated right ventricular systolic pressure is 41.9 mmHg. Left Atrium: Left atrial size was mild to moderately dilated. Right Atrium: Right atrial size was normal in size. Pericardium: There is no evidence of pericardial effusion. Mitral Valve: Status post mitral valve repair. The anterior leaflet is calcified and mildly thickened, both leaflets show decreased excursion. Mean gradient 10 mmHg. At least moderate mitral stenosis with MVA 1.63 cm^2 by PHT and 0.74 cm^2 by VTI. The mitral valve has been repaired/replaced. Mild mitral valve regurgitation. There is a present in the mitral position. Procedure  Date: 34. MV peak gradient, 21.9 mmHg. The mean mitral valve gradient is 10.0 mmHg. Tricuspid Valve: The tricuspid valve is normal in structure. Tricuspid valve regurgitation is trivial. Aortic Valve: The aortic valve is tricuspid. There is moderate calcification of the aortic valve. Aortic valve regurgitation is mild. Aortic regurgitation PHT measures 573 msec. Mild aortic stenosis is present. Aortic valve mean gradient measures 16.0 mmHg. Aortic valve peak gradient measures 25.6 mmHg. Aortic valve area, by VTI measures 0.79 cm. Pulmonic Valve: The pulmonic valve was normal in structure. Pulmonic valve regurgitation is trivial. Aorta: The aortic root is normal in size and structure. Venous: The inferior vena cava is normal in size with greater than 50% respiratory variability, suggesting right atrial pressure of 3 mmHg. IAS/Shunts: No atrial level shunt detected by  color flow Doppler.  LEFT VENTRICLE PLAX 2D LVIDd:         4.00 cm   Diastology LVIDs:         2.50 cm   LV e' medial:    5.22 cm/s LV PW:         0.90 cm   LV E/e' medial:  39.8 LV IVS:        1.10 cm   LV e' lateral:   5.00 cm/s LVOT diam:     1.60 cm   LV E/e' lateral: 41.6 LV SV:         51 LV SV Index:   27        2D Longitudinal Strain LVOT Area:     2.01 cm  2D Strain GLS (A4C):   -17.6 %                          2D Strain GLS (A3C):   -23.2 %                          2D Strain GLS (A2C):   -19.1 %                          2D Strain GLS Avg:     -20.0 % RIGHT VENTRICLE RV Basal diam:  2.90 cm RV S prime:     11.50 cm/s TAPSE (M-mode): 1.8 cm RVSP:           41.9 mmHg LEFT ATRIUM             Index        RIGHT ATRIUM           Index LA diam:        5.10 cm 2.77 cm/m   RA Pressure: 3.00 mmHg LA Vol (A2C):   74.5 ml 40.40 ml/m  RA Area:     10.30 cm LA Vol (A4C):   62.5 ml 33.90 ml/m  RA Volume:   21.40 ml  11.61 ml/m LA Biplane Vol: 68.2 ml 36.99 ml/m  AORTIC VALVE AV Area (Vmax):    0.82 cm AV Area (Vmean):   0.77 cm AV Area (VTI):      0.79 cm AV Vmax:           253.00 cm/s AV Vmean:          187.000 cm/s AV VTI:            0.643 m AV Peak Grad:      25.6 mmHg AV Mean Grad:      16.0 mmHg LVOT Vmax:         103.00 cm/s LVOT Vmean:        71.900 cm/s LVOT VTI:          0.252 m LVOT/AV VTI ratio: 0.39 AI PHT:            573 msec  AORTA Ao Root diam: 2.30 cm Ao Asc diam:  2.80 cm MITRAL VALVE                TRICUSPID VALVE MV Area (PHT): 1.63 cm     TR Peak grad:   38.9 mmHg MV Area VTI:   0.74 cm     TR Vmax:        312.00 cm/s MV Peak grad:  21.9 mmHg    Estimated RAP:  3.00 mmHg MV Mean grad:  10.0 mmHg    RVSP:           41.9 mmHg MV Vmax:       2.34 m/s MV Vmean:      142.5 cm/s   SHUNTS MV Decel Time: 466 msec     Systemic VTI:  0.25 m MR Peak grad: 53.6 mmHg     Systemic Diam: 1.60 cm MR Mean grad: 43.0 mmHg MR Vmax:      366.00 cm/s MR Vmean:     321.0 cm/s MV E velocity: 208.00 cm/s MV A velocity: 121.00 cm/s MV E/A ratio:  1.72 Dalton McleanMD Electronically signed by Ezra Kanner Signature Date/Time: 08/13/2023/10:51:48 AM    Final     Recent Labs: Lab Results  Component Value Date   WBC 5.3 09/07/2023   HGB 12.5 09/07/2023   PLT 229 09/07/2023   NA 143 09/07/2023   K 3.6 09/07/2023   CL 111 09/07/2023   CO2 19 (L) 09/07/2023   GLUCOSE 83 09/07/2023   BUN 12 09/07/2023   CREATININE 1.22 (H) 09/07/2023   BILITOT 0.3 09/07/2023   ALKPHOS 61 09/07/2023   AST 22 09/07/2023   ALT 12 09/07/2023   PROT 6.4 (L) 09/07/2023   ALBUMIN 3.6 09/07/2023   CALCIUM 8.6 (L) 09/07/2023   GFRAA 51 (L) 11/11/2019    Speciality Comments: PLQ Eye Exam: 08/03/2018 WNL @ [facility] Follow up in 1 year  Procedures:  No procedures performed Allergies: Cymbalta [duloxetine hcl], Trazodone , Statins, and Sulfa antibiotics    Assessment / Plan:     Visit Diagnoses: Sjogren's syndrome with keratoconjunctivitis sicca (HCC) - +ANA, +Ro: Patient continues to have chronic sicca symptoms.  She has been switched to Zai dry eyedrops  which she has been using twice daily for dry eyes.  She has noticed an improvement in her symptoms of dry eyes since initiating this eyedrop.  She continues to use Biotene products for dry mouth.  She has been seeing the dentist every 6 months and ophthalmology on a yearly basis.  Discussed the increased risk for developing lymphoma in patients with Sjogren's syndrome.  Plan to check sed rate and SPEP today. She is not currently taking immunosuppressive agents and does not require treatment at this time. Lab work from 10/11/22: ESR 38, Ro>8, dsDNA negative, and complements WNL. Plan to update the following lab work today. She will notify us  if she develops any new or worsening symptoms.  She will follow-up in the office in 5 months or sooner if needed. - Plan: Protein / creatinine ratio, urine, ANA, Anti-DNA antibody, double-stranded, Sedimentation rate, C3 and C4, Sjogrens syndrome-A extractable nuclear antibody, Serum protein electrophoresis with reflex, Rheumatoid factor  High risk medication use - Discontinued PLQ in Feb 2021.  She is not taking any immunosuppressive agents at this time.  Primary osteoarthritis of both hands: She has PIP and DIP thickening consistent with osteoarthritis of both hands.  No tenderness or synovitis noted.  Complete fist formation bilaterally.  She has been working with occupational therapy for hand strengthening.  Primary osteoarthritis of right knee - X-rays from April 2023 which were consistent with osteoarthritis, right knee joint injection in April by Dr. Ubaldo.  She has good range of motion of the right knee joint on examination today.  No warmth or effusion noted.  She is been working with a physical therapist at home to improve her balance and lower extremity strengthening.  DDD (degenerative disc disease), cervical:  C-spine has good range of motion with no discomfort currently.  No symptoms of radiculopathy.  Degeneration of intervertebral disc of lumbar  region without discogenic back pain or lower extremity pain: Chronic pain.  Slightly limited mobility.  No symptoms of radiculopathy at this time.  Plan to place referral to pain management as requested.  Osteopenia of multiple sites - Jul 16, 2021 DEXA scan T score of -1.1 and BMD 0.880 in the left femoral neck.  She is taking vitamin D  1000 units daily. Patient is due for an updated bone density and plans to have her PCP place the order at the breast center.  Fibromyalgia: She continues to have generalized myalgias and muscle tenderness due to fibromyalgia.  Her symptoms have been inadequately controlled despite taking gabapentin  as prescribed and Tylenol  as needed.  She has been working with both an occupational therapist and physical therapist at home for upper and lower extremity strengthening.  A referral to pain management will be placed today.  Trochanteric bursitis, right hip: Patient presents today with increased pain in the right hip.  No groin pain currently.  She has tenderness over the right trochanteric bursa.  Different treatment options were discussed today.  Patient was given a handout of home exercises to perform.  She is also been working with a physical therapist at home and plans on incorporating some of these exercises.   Other chronic pain: She is no longer tolerating tramadol  due to GI side effects.  Plan to place a referral to pain management to discuss treatment alternatives since her pain levels have been inadequately controlled taking Tylenol  and gabapentin .  Other fatigue: Chronic, stable.  Discussed the importance of regular exercise and good sleep hygiene.  Other insomnia - She is taking Ambien  10 mg at bedtime for insomnia.  Other medical conditions are listed as follows:  History of hypertension - BP 131/81 today in the office.  Stage 3a chronic kidney disease (HCC): Evaluated by Dr. Macel.  Mildly progressive CKD with no proteinuria.  Typical atrial flutter  (HCC)  History of hypercholesterolemia  History of pulmonary embolism  Chronic anticoagulation  Thyroid  nodule  History of anxiety  Orders: Orders Placed This Encounter  Procedures   Protein / creatinine ratio, urine   ANA   Anti-DNA antibody, double-stranded   Sedimentation rate   C3 and C4   Sjogrens syndrome-A extractable nuclear antibody   Serum protein electrophoresis with reflex   Rheumatoid factor   No orders of the defined types were placed in this encounter.   Follow-Up Instructions: Return in about 5 months (around 02/11/2024) for Sjogren's syndrome.   Waddell CHRISTELLA Craze, PA-C  Note - This record has been created using Dragon software.  Chart creation errors have been sought, but may not always  have been located. Such creation errors do not reflect on  the standard of medical care.

## 2023-09-02 DIAGNOSIS — Z87442 Personal history of urinary calculi: Secondary | ICD-10-CM | POA: Diagnosis not present

## 2023-09-02 DIAGNOSIS — K219 Gastro-esophageal reflux disease without esophagitis: Secondary | ICD-10-CM | POA: Diagnosis not present

## 2023-09-02 DIAGNOSIS — G8929 Other chronic pain: Secondary | ICD-10-CM | POA: Diagnosis not present

## 2023-09-02 DIAGNOSIS — I484 Atypical atrial flutter: Secondary | ICD-10-CM | POA: Diagnosis not present

## 2023-09-02 DIAGNOSIS — W19XXXD Unspecified fall, subsequent encounter: Secondary | ICD-10-CM | POA: Diagnosis not present

## 2023-09-02 DIAGNOSIS — M3501 Sicca syndrome with keratoconjunctivitis: Secondary | ICD-10-CM | POA: Diagnosis not present

## 2023-09-02 DIAGNOSIS — I4891 Unspecified atrial fibrillation: Secondary | ICD-10-CM | POA: Diagnosis not present

## 2023-09-02 DIAGNOSIS — I130 Hypertensive heart and chronic kidney disease with heart failure and stage 1 through stage 4 chronic kidney disease, or unspecified chronic kidney disease: Secondary | ICD-10-CM | POA: Diagnosis not present

## 2023-09-02 DIAGNOSIS — Z87440 Personal history of urinary (tract) infections: Secondary | ICD-10-CM | POA: Diagnosis not present

## 2023-09-02 DIAGNOSIS — E7800 Pure hypercholesterolemia, unspecified: Secondary | ICD-10-CM | POA: Diagnosis not present

## 2023-09-02 DIAGNOSIS — M797 Fibromyalgia: Secondary | ICD-10-CM | POA: Diagnosis not present

## 2023-09-02 DIAGNOSIS — M150 Primary generalized (osteo)arthritis: Secondary | ICD-10-CM | POA: Diagnosis not present

## 2023-09-02 DIAGNOSIS — Z7901 Long term (current) use of anticoagulants: Secondary | ICD-10-CM | POA: Diagnosis not present

## 2023-09-02 DIAGNOSIS — I5032 Chronic diastolic (congestive) heart failure: Secondary | ICD-10-CM | POA: Diagnosis not present

## 2023-09-02 DIAGNOSIS — Z951 Presence of aortocoronary bypass graft: Secondary | ICD-10-CM | POA: Diagnosis not present

## 2023-09-02 DIAGNOSIS — N1832 Chronic kidney disease, stage 3b: Secondary | ICD-10-CM | POA: Diagnosis not present

## 2023-09-02 DIAGNOSIS — G6289 Other specified polyneuropathies: Secondary | ICD-10-CM | POA: Diagnosis not present

## 2023-09-04 DIAGNOSIS — Z87440 Personal history of urinary (tract) infections: Secondary | ICD-10-CM | POA: Diagnosis not present

## 2023-09-04 DIAGNOSIS — E7800 Pure hypercholesterolemia, unspecified: Secondary | ICD-10-CM | POA: Diagnosis not present

## 2023-09-04 DIAGNOSIS — I4891 Unspecified atrial fibrillation: Secondary | ICD-10-CM | POA: Diagnosis not present

## 2023-09-04 DIAGNOSIS — W19XXXD Unspecified fall, subsequent encounter: Secondary | ICD-10-CM | POA: Diagnosis not present

## 2023-09-04 DIAGNOSIS — N1832 Chronic kidney disease, stage 3b: Secondary | ICD-10-CM | POA: Diagnosis not present

## 2023-09-04 DIAGNOSIS — K219 Gastro-esophageal reflux disease without esophagitis: Secondary | ICD-10-CM | POA: Diagnosis not present

## 2023-09-04 DIAGNOSIS — G6289 Other specified polyneuropathies: Secondary | ICD-10-CM | POA: Diagnosis not present

## 2023-09-04 DIAGNOSIS — M3501 Sicca syndrome with keratoconjunctivitis: Secondary | ICD-10-CM | POA: Diagnosis not present

## 2023-09-04 DIAGNOSIS — I5032 Chronic diastolic (congestive) heart failure: Secondary | ICD-10-CM | POA: Diagnosis not present

## 2023-09-04 DIAGNOSIS — Z87442 Personal history of urinary calculi: Secondary | ICD-10-CM | POA: Diagnosis not present

## 2023-09-04 DIAGNOSIS — G8929 Other chronic pain: Secondary | ICD-10-CM | POA: Diagnosis not present

## 2023-09-04 DIAGNOSIS — M150 Primary generalized (osteo)arthritis: Secondary | ICD-10-CM | POA: Diagnosis not present

## 2023-09-04 DIAGNOSIS — I484 Atypical atrial flutter: Secondary | ICD-10-CM | POA: Diagnosis not present

## 2023-09-04 DIAGNOSIS — I130 Hypertensive heart and chronic kidney disease with heart failure and stage 1 through stage 4 chronic kidney disease, or unspecified chronic kidney disease: Secondary | ICD-10-CM | POA: Diagnosis not present

## 2023-09-04 DIAGNOSIS — Z7901 Long term (current) use of anticoagulants: Secondary | ICD-10-CM | POA: Diagnosis not present

## 2023-09-04 DIAGNOSIS — Z951 Presence of aortocoronary bypass graft: Secondary | ICD-10-CM | POA: Diagnosis not present

## 2023-09-04 DIAGNOSIS — M797 Fibromyalgia: Secondary | ICD-10-CM | POA: Diagnosis not present

## 2023-09-05 DIAGNOSIS — I4891 Unspecified atrial fibrillation: Secondary | ICD-10-CM | POA: Diagnosis not present

## 2023-09-05 DIAGNOSIS — M3501 Sicca syndrome with keratoconjunctivitis: Secondary | ICD-10-CM | POA: Diagnosis not present

## 2023-09-05 DIAGNOSIS — W19XXXD Unspecified fall, subsequent encounter: Secondary | ICD-10-CM | POA: Diagnosis not present

## 2023-09-05 DIAGNOSIS — Z87440 Personal history of urinary (tract) infections: Secondary | ICD-10-CM | POA: Diagnosis not present

## 2023-09-05 DIAGNOSIS — K219 Gastro-esophageal reflux disease without esophagitis: Secondary | ICD-10-CM | POA: Diagnosis not present

## 2023-09-05 DIAGNOSIS — Z951 Presence of aortocoronary bypass graft: Secondary | ICD-10-CM | POA: Diagnosis not present

## 2023-09-05 DIAGNOSIS — I5032 Chronic diastolic (congestive) heart failure: Secondary | ICD-10-CM | POA: Diagnosis not present

## 2023-09-05 DIAGNOSIS — Z7901 Long term (current) use of anticoagulants: Secondary | ICD-10-CM | POA: Diagnosis not present

## 2023-09-05 DIAGNOSIS — E7800 Pure hypercholesterolemia, unspecified: Secondary | ICD-10-CM | POA: Diagnosis not present

## 2023-09-05 DIAGNOSIS — N1832 Chronic kidney disease, stage 3b: Secondary | ICD-10-CM | POA: Diagnosis not present

## 2023-09-05 DIAGNOSIS — M150 Primary generalized (osteo)arthritis: Secondary | ICD-10-CM | POA: Diagnosis not present

## 2023-09-05 DIAGNOSIS — G8929 Other chronic pain: Secondary | ICD-10-CM | POA: Diagnosis not present

## 2023-09-05 DIAGNOSIS — I484 Atypical atrial flutter: Secondary | ICD-10-CM | POA: Diagnosis not present

## 2023-09-05 DIAGNOSIS — G6289 Other specified polyneuropathies: Secondary | ICD-10-CM | POA: Diagnosis not present

## 2023-09-05 DIAGNOSIS — I130 Hypertensive heart and chronic kidney disease with heart failure and stage 1 through stage 4 chronic kidney disease, or unspecified chronic kidney disease: Secondary | ICD-10-CM | POA: Diagnosis not present

## 2023-09-05 DIAGNOSIS — Z87442 Personal history of urinary calculi: Secondary | ICD-10-CM | POA: Diagnosis not present

## 2023-09-05 DIAGNOSIS — M797 Fibromyalgia: Secondary | ICD-10-CM | POA: Diagnosis not present

## 2023-09-05 NOTE — Patient Outreach (Signed)
 Complex Care Management   Visit Note  09/05/2023  Name:  Alexandria Leach MRN: [id number] DOB: September 19, 1950  Situation: Call to patient to offer transfer to case manager, Gilberto Labella RN for ongoing case management follow up.  Patient consented to transfer.    Background:   Past Medical History:  Diagnosis Date   Ankle fracture, left 02/19/2015   from a fall   Anxiety    Cataract 2014   corrected with surgery   CHF (congestive heart failure) (HCC)    CKD (chronic kidney disease) stage 3, GFR 30-59 ml/min [facility])    saw nephrologist Dr Arlana Labor   Community acquired pneumonia 11/29/2022   Multifocal s/p hospitalization 11/2022     COVID-19 09/21/2021   DDD (degenerative disc disease), cervical    Depression    Fibromyalgia    Glaucoma    s/p surgery, sees ophtho Q6 mo   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 Thought benign.    Osteoarthritis    shoulders and knees, not RA per Dr Alvira Josephs, positive ANA, positive Ro   Osteopenia 09/18/2015   DEXA T -1.1 hip, -0.2 spine 08/2015    Personal history of urinary calculi latest 2014   Pneumonia 12/02/2011   PONV (postoperative nausea and vomiting)    Refusal of blood transfusions as patient is Jehovah's Witness    Rheumatic heart disease 1980   s/p mitral valve repair 1980   Sjogren's syndrome (HCC)    Trimalleolar fracture of left ankle 02/23/2015      Follow Up Plan:   Telephone follow up appointment date/time:  09/12/23 at 2 pm with Gilberto Labella, RN case manager  Verba Girt RN, BSN, CCM [place]  [facility], Population Health Case Manager Phone:  [phone]

## 2023-09-07 ENCOUNTER — Encounter (HOSPITAL_BASED_OUTPATIENT_CLINIC_OR_DEPARTMENT_OTHER): Payer: Self-pay

## 2023-09-07 ENCOUNTER — Emergency Department (HOSPITAL_BASED_OUTPATIENT_CLINIC_OR_DEPARTMENT_OTHER)
Admission: EM | Admit: 2023-09-07 | Discharge: 2023-09-07 | Disposition: A | Discharge status: Home / Self Care | Attending: Emergency Medicine | Admitting: Emergency Medicine

## 2023-09-07 ENCOUNTER — Emergency Department (HOSPITAL_BASED_OUTPATIENT_CLINIC_OR_DEPARTMENT_OTHER): Admitting: Radiology

## 2023-09-07 ENCOUNTER — Other Ambulatory Visit: Payer: Self-pay

## 2023-09-07 DIAGNOSIS — I509 Heart failure, unspecified: Secondary | ICD-10-CM | POA: Insufficient documentation

## 2023-09-07 DIAGNOSIS — Z7901 Long term (current) use of anticoagulants: Secondary | ICD-10-CM | POA: Diagnosis not present

## 2023-09-07 DIAGNOSIS — R0781 Pleurodynia: Secondary | ICD-10-CM | POA: Diagnosis not present

## 2023-09-07 DIAGNOSIS — R0789 Other chest pain: Secondary | ICD-10-CM | POA: Insufficient documentation

## 2023-09-07 DIAGNOSIS — I110 Hypertensive heart disease with heart failure: Secondary | ICD-10-CM | POA: Diagnosis not present

## 2023-09-07 DIAGNOSIS — R079 Chest pain, unspecified: Secondary | ICD-10-CM | POA: Diagnosis present

## 2023-09-07 LAB — CBC WITH DIFFERENTIAL/PLATELET
Abs Immature Granulocytes: 0.01 10*3/uL (ref 0.00–0.07)
Basophils Absolute: 0 10*3/uL (ref 0.0–0.1)
Basophils Relative: 1 %
Eosinophils Absolute: 0.1 10*3/uL (ref 0.0–0.5)
Eosinophils Relative: 3 %
HCT: 38.2 % (ref 36.0–46.0)
Hemoglobin: 12.5 g/dL (ref 12.0–15.0)
Immature Granulocytes: 0 %
Lymphocytes Relative: 35 %
Lymphs Abs: 1.8 10*3/uL (ref 0.7–4.0)
MCH: 27.4 pg (ref 26.0–34.0)
MCHC: 32.7 g/dL (ref 30.0–36.0)
MCV: 83.8 fL (ref 80.0–100.0)
Monocytes Absolute: 0.5 10*3/uL (ref 0.1–1.0)
Monocytes Relative: 10 %
Neutro Abs: 2.7 10*3/uL (ref 1.7–7.7)
Neutrophils Relative %: 51 %
Platelets: 229 10*3/uL (ref 150–400)
RBC: 4.56 MIL/uL (ref 3.87–5.11)
RDW: 14.5 % (ref 11.5–15.5)
WBC: 5.3 10*3/uL (ref 4.0–10.5)
nRBC: 0 % (ref 0.0–0.2)

## 2023-09-07 LAB — COMPREHENSIVE METABOLIC PANEL WITH GFR
ALT: 12 U/L (ref 0–44)
AST: 22 U/L (ref 15–41)
Albumin: 3.6 g/dL (ref 3.5–5.0)
Alkaline Phosphatase: 61 U/L (ref 38–126)
Anion gap: 12 (ref 5–15)
BUN: 12 mg/dL (ref 8–23)
CO2: 19 mmol/L — ABNORMAL LOW (ref 22–32)
Calcium: 8.6 mg/dL — ABNORMAL LOW (ref 8.9–10.3)
Chloride: 111 mmol/L (ref 98–111)
Creatinine, Ser: 1.22 mg/dL — ABNORMAL HIGH (ref 0.44–1.00)
GFR, Estimated: 47 mL/min — ABNORMAL LOW (ref >60)
Glucose, Bld: 83 mg/dL (ref 70–99)
Potassium: 3.6 mmol/L (ref 3.5–5.1)
Sodium: 143 mmol/L (ref 135–145)
Total Bilirubin: 0.3 mg/dL (ref 0.0–1.2)
Total Protein: 6.4 g/dL — ABNORMAL LOW (ref 6.5–8.1)

## 2023-09-07 LAB — TROPONIN T, HIGH SENSITIVITY
Troponin T High Sensitivity: <15 ng/L (ref <19)
Troponin T High Sensitivity: <15 ng/L (ref <19)

## 2023-09-07 MED ORDER — HYDROCODONE-ACETAMINOPHEN 5-325 MG PO TABS: 1.0000 | ORAL_TABLET | Freq: Four times a day (QID) | ORAL | 0 refills | Status: DC | PRN

## 2023-09-07 MED ORDER — FENTANYL CITRATE PF 50 MCG/ML IJ SOSY
12.5000 ug | PREFILLED_SYRINGE | Freq: Once | INTRAMUSCULAR | Status: AC
  Administered 2023-09-07: 12.5 ug via INTRAVENOUS
  Filled 2023-09-07: qty 1

## 2023-09-07 NOTE — ED Provider Notes (Addendum)
 [place] EMERGENCY DEPARTMENT AT [facility] Provider Note   CSN: [id number] Arrival date & time: 09/07/23  9165     Patient presents with: Back Pain   Alexandria Leach is a 73 y.o. female.   Patient with about 3-day history of some bilateral rib pain.  That radiates towards the center of the back that the other way.  Patient started physical therapy and Occupational Therapy about a week ago.  No fall or injury.  Does not hurt more to move her arms but it does hurt more to sit up.  No low back pain.  No anterior chest pain.  States that it starts in the flanks where the ribs are and then moves around to the center.  Patient drove herself here.  It was very difficult for her to get out of the car and to get into the bed.  Patient is on Xanax  on a regular basis.  Patient is also on Eliquis .  Past medical history sniffer hypertension rheumatic heart disease hyperlipidemia history of deep vein thrombosis history of PE in 2006.  History of congestive heart failure.  Patient is followed by cardiology.  Patient is on Lasix  patient is on Tikosyn .  Not associated with shortness of breath.  Patient's vital signs here temp 98.6 pulse 70 respirations 18 blood pressure 168/77 oxygen saturation is 100% on room air.       Prior to Admission medications   Medication Sig Start Date End Date Taking? Authorizing Provider  ALPRAZolam  (XANAX ) 0.5 MG tablet Take 1 tablet (0.5 mg total) by mouth daily. 08/20/23   Okey Barnie SAUNDERS, MD  amoxicillin  (AMOXIL ) 500 MG tablet Take 4 tablets 1 hour prior to dental work 07/08/23   Pietro Redell RAMAN, MD  antiseptic oral rinse (BIOTENE) LIQD 15 mLs by Mouth Rinse route 2 (two) times daily as needed for dry mouth.    [provider]  apixaban  (ELIQUIS ) 5 MG TABS tablet TAKE 1 TABLET BY MOUTH TWICE  DAILY 07/17/23   Pietro Redell RAMAN, MD  cholecalciferol  (VITAMIN D ) 1000 UNITS tablet Take 1,000 Units by mouth daily.    [provider]   cyanocobalamin  (VITAMIN B12) 1000 MCG tablet Take 1 tablet (1,000 mcg total) by mouth once a week. 01/11/22   Rilla Baller, MD  diltiazem  (CARDIZEM  CD) 360 MG 24 hr capsule TAKE 1 CAPSULE BY MOUTH DAILY 02/25/23   Pietro Redell RAMAN, MD  docusate sodium  (COLACE) 100 MG capsule Take 100 mg by mouth daily.    [provider]  dofetilide  (TIKOSYN ) 250 MCG capsule Take 1 capsule (250 mcg total) by mouth 2 (two) times daily. 08/21/23   Fenton, Clint R, PA  ezetimibe  (ZETIA ) 10 MG tablet Take 1 tablet (10 mg total) by mouth at bedtime. 01/20/23   Rilla Baller, MD  furosemide  (LASIX ) 20 MG tablet Take 2 tablets (40 mg total) by mouth daily. 08/21/23   Pietro Redell RAMAN, MD  gabapentin  (NEURONTIN ) 300 MG capsule TAKE 2 CAPSULES BY MOUTH IN THE MORNING 1 CAPSULE BY  MOUTH IN THE AFTERNOON AND  2 CAPSULES BY MOUTH AT  BEDTIME 01/20/23   Rilla Baller, MD  loratadine  (CLARITIN ) 10 MG tablet Take 1 tablet (10 mg total) by mouth daily. 02/19/22   Rilla Baller, MD  losartan  (COZAAR ) 100 MG tablet Take 0.5 tablets (50 mg total) by mouth daily. 01/20/23   Rilla Baller, MD  magnesium  oxide (MAG-OX) 400 (240 Mg) MG tablet Take 1 tablet (400 mg total) by mouth daily.  01/20/23   Rilla Baller, MD  metoprolol  tartrate (LOPRESSOR ) 50 MG tablet TAKE 1 AND 1/2 TABLETS BY MOUTH  TWICE DAILY 07/21/23   Pietro Redell RAMAN, MD  pantoprazole  (PROTONIX ) 40 MG tablet Take 1 tablet (40 mg total) by mouth daily. 01/20/23   Rilla Baller, MD  Polyethyl Glycol-Propyl Glycol (SYSTANE) 0.4-0.3 % GEL ophthalmic gel Place 1 drop into both eyes 2 (two) times daily.     [provider]  potassium chloride  (KLOR-CON  M) 10 MEQ tablet TAKE 1 TABLET BY MOUTH TWICE  DAILY 02/25/23   Rilla Baller, MD  RESTASIS  0.05 % ophthalmic emulsion INSTILL ONE DROP IN BOTH  EYES TWO TIMES DAILY 12/02/16   Rilla Baller, MD  thiamine  (VITAMIN B1) 100 MG tablet Take 1 tablet (100 mg total) by mouth once a week.  07/31/22   Rilla Baller, MD  traMADol  (ULTRAM ) 50 MG tablet TAKE 1 TABLET BY MOUTH DAILY AS  NEEDED 04/04/23   Cheryl Waddell HERO, PA-C  zolpidem  (AMBIEN ) 10 MG tablet Take 1 tablet (10 mg total) by mouth at bedtime as needed for sleep. 08/20/23 11/18/23  Okey Barnie SAUNDERS, MD    Allergies: Cymbalta [duloxetine hcl], Trazodone , Statins, and Sulfa antibiotics    Review of Systems  Constitutional:  Negative for chills and fever.  HENT:  Negative for ear pain and sore throat.   Eyes:  Negative for pain and visual disturbance.  Respiratory:  Negative for cough and shortness of breath.   Cardiovascular:  Positive for chest pain. Negative for palpitations.  Gastrointestinal:  Negative for abdominal pain and vomiting.  Genitourinary:  Negative for dysuria and hematuria.  Musculoskeletal:  Negative for arthralgias and back pain.  Skin:  Negative for color change and rash.  Neurological:  Negative for seizures and syncope.  All other systems reviewed and are negative.   Updated Vital Signs BP (!) 168/77 (BP Location: Right Arm)   Pulse 70   Temp 98.6 F (37 C) (Oral)   Resp 18   Ht 1.549 m (5' 1)   Wt 87.5 kg   SpO2 100%   BMI 36.47 kg/m   Physical Exam Vitals and nursing note reviewed.  Constitutional:      General: She is not in acute distress.    Appearance: Normal appearance. She is well-developed. She is not ill-appearing.  HENT:     Head: Normocephalic and atraumatic.     Mouth/Throat:     Mouth: Mucous membranes are moist.   Eyes:     Extraocular Movements: Extraocular movements intact.     Conjunctiva/sclera: Conjunctivae normal.     Pupils: Pupils are equal, round, and reactive to light.    Cardiovascular:     Rate and Rhythm: Normal rate and regular rhythm.     Heart sounds: No murmur heard. Pulmonary:     Effort: Pulmonary effort is normal. No respiratory distress.     Breath sounds: Normal breath sounds.     Comments: No tenderness to palpation to bilateral ribs.   No thoracic spine tenderness. Chest:     Chest wall: No tenderness.  Abdominal:     Palpations: Abdomen is soft.     Tenderness: There is no abdominal tenderness.   Musculoskeletal:        General: No swelling.     Cervical back: Normal range of motion and neck supple.     Right lower leg: Edema present.     Left lower leg: Edema present.   Skin:    General:  Skin is warm and dry.     Capillary Refill: Capillary refill takes less than 2 seconds.   Neurological:     General: No focal deficit present.     Mental Status: She is alert and oriented to person, place, and time.   Psychiatric:        Mood and Affect: Mood normal.     (all labs ordered are listed, but only abnormal results are displayed) Labs Reviewed  CBC WITH DIFFERENTIAL/PLATELET  COMPREHENSIVE METABOLIC PANEL WITH GFR  TROPONIN T, HIGH SENSITIVITY    EKG: None  Radiology: No results found.   Procedures   Medications Ordered in the ED  fentaNYL  (SUBLIMAZE ) injection 12.5 mcg (has no administration in time range)                                    Medical Decision Making Amount and/or Complexity of Data Reviewed Labs: ordered. Radiology: ordered.  Risk Prescription drug management.  Difficult to tell for sure what is causing the pain.  But she does have a significant cardiac history.  This just may be musculoskeletal pain from the physical therapy would expect the pain to be worse with moving her arms.  Therefore we will do a bit of a broad workup to rule out anything serious.  Patient given IV pain medicine here.  Patient feeling much better.  Now complaining of more of pain on the right upper flank area not really over the ribs.  Clinically a little suspicious that this all may be due to musculoskeletal from her physical therapy that is ongoing.  Troponins x 2 were both less than 15.  Complete metabolic panel GFR is 47 with a creatinine 1.22 but that is not all from baseline for her.  CBC normal  white count 5.3 hemoglobin 12.5 platelets 229.  X-rays of chest and bilateral ribs without any abnormalities.  In both lungs were clear.  I think now that patient does not have any rib pain anymore this was probably just musculoskeletal.  Although we did not get a urinalysis on her.  I think with the other labs being very normal that we can discharge her have her follow-up primary care doctor symptoms persist.  May need urinalysis may need CT scan abdomen.  Patient stable for discharge home follow-up with her primary care doctor.  Will give her short course of hydrocodone .  Final diagnoses:  Chest wall pain    ED Discharge Orders     None          Geraldene Hamilton, MD 09/07/23 0930    Geraldene Hamilton, MD 09/07/23 1253

## 2023-09-07 NOTE — ED Triage Notes (Signed)
 Pt reports bilateral mid/lower back pain x5 days. Pt reports minor relief with Tylenol . Pt denies any loss of bladder or bowel. Pt denies any recent injury or trauma.

## 2023-09-07 NOTE — Discharge Instructions (Signed)
 Workup here today without any acute findings.  But if symptoms persist probably would need urinalysis by your primary care doctor and consideration may be for CT scan abdomen pelvis.  Initially were concerned about bilateral lateral chest pain.  And workup for that was negative.  This may all be secondary to musculoskeletal pain due to getting physical therapy.  Take the hydrocodone  as needed for pain.

## 2023-09-08 DIAGNOSIS — H04123 Dry eye syndrome of bilateral lacrimal glands: Secondary | ICD-10-CM | POA: Diagnosis not present

## 2023-09-08 DIAGNOSIS — H16223 Keratoconjunctivitis sicca, not specified as Sjogren's, bilateral: Secondary | ICD-10-CM | POA: Diagnosis not present

## 2023-09-09 ENCOUNTER — Other Ambulatory Visit (HOSPITAL_COMMUNITY): Payer: Self-pay

## 2023-09-09 ENCOUNTER — Ambulatory Visit: Attending: Cardiovascular Disease | Admitting: Pharmacist

## 2023-09-09 ENCOUNTER — Telehealth: Payer: Self-pay

## 2023-09-09 ENCOUNTER — Telehealth: Payer: Self-pay | Admitting: Pharmacist

## 2023-09-09 ENCOUNTER — Encounter: Payer: Self-pay | Admitting: Pharmacist

## 2023-09-09 DIAGNOSIS — M791 Myalgia, unspecified site: Secondary | ICD-10-CM

## 2023-09-09 DIAGNOSIS — R739 Hyperglycemia, unspecified: Secondary | ICD-10-CM | POA: Insufficient documentation

## 2023-09-09 DIAGNOSIS — N179 Acute kidney failure, unspecified: Secondary | ICD-10-CM | POA: Insufficient documentation

## 2023-09-09 DIAGNOSIS — G459 Transient cerebral ischemic attack, unspecified: Secondary | ICD-10-CM | POA: Diagnosis not present

## 2023-09-09 DIAGNOSIS — E7800 Pure hypercholesterolemia, unspecified: Secondary | ICD-10-CM

## 2023-09-09 DIAGNOSIS — R110 Nausea: Secondary | ICD-10-CM | POA: Insufficient documentation

## 2023-09-09 DIAGNOSIS — T466X5D Adverse effect of antihyperlipidemic and antiarteriosclerotic drugs, subsequent encounter: Secondary | ICD-10-CM

## 2023-09-09 MED ORDER — XIIDRA 5 % OP SOLN
1.0000 [drp] | Freq: Two times a day (BID) | OPHTHALMIC | 4 refills | Status: AC
  Filled 2023-09-09: qty 60, 30d supply, fill #0
  Filled 2023-10-06: qty 60, 30d supply, fill #1
  Filled 2023-11-04: qty 60, 30d supply, fill #2
  Filled 2024-01-21: qty 60, 30d supply, fill #3
  Filled 2024-03-09: qty 60, 30d supply, fill #4
  Filled 2024-04-12: qty 60, 30d supply, fill #5

## 2023-09-09 MED ORDER — REPATHA SURECLICK 140 MG/ML ~~LOC~~ SOAJ
1.0000 mL | SUBCUTANEOUS | 3 refills | Status: AC
  Filled 2023-09-09: qty 6, 84d supply, fill #0
  Filled 2023-11-25: qty 6, 84d supply, fill #1
  Filled 2024-02-11: qty 6, 84d supply, fill #2

## 2023-09-09 NOTE — Addendum Note (Signed)
 Addended by: DARRELL BRUCKNER on: 09/09/2023 02:04 PM   Modules accepted: Orders

## 2023-09-09 NOTE — Patient Instructions (Signed)
 It was nice meeting you today  We would like your LDL (bad cholesterol) to be less than 55  Please continue your ezetimibe  10mg  once a day  The medication we discussed today is called Repatha, which is administered once every 2 weeks  I will complete the prior authorization for you and contact you when it is approved  Once you start the medication, we will recheck your fasting lipid panel in about 3 months  Please let me know if you have any questions  Medford Bolk, PharmD, BCACP, CDCES, CPP [facility] [address], [place], [place] [address] Phone: [phone]; Fax: [phone] 09/09/2023 8:19 AM

## 2023-09-09 NOTE — Progress Notes (Signed)
 Patient ID: Alexandria Leach                 DOB: July 11, 1950                    MRN: [id number]     HPI: Alexandria Leach is a 73 y.o. female patient referred to lipid clinic by Dr Pietro. PMH is significant for TIA, HFrEF, HTN, aortic stenosis, HLD, and CKD. Patient is statin intolerant.  Patient presents today to discuss lip-id management. Was in ED two nights ago for chest pain. Troponins were normal and pain was thought to be musculoskeletal.   Has tried numerous statins in the past and all caused muscle cramping. Has tried lovastatin , atorvastatin, rosuvastatin, and simvastatin. Currently only managed on ezetimibe  10mg  once daily and reports no adverse effects.   Currently going to physical therapy weekly.   Current Medications: Ezetimibe  10mg  daily  Intolerances:  Atorvastatin Rosuvastatin Simvastatin Lovastatin   Risk Factors:  HFrEF Hx of TIA HTN Aortic stenosis  LDL goal: <55  Labs: TC 222, LDL 141, Trigs 93, HDL 65 (07/08/23)  Past Medical History:  Diagnosis Date   Ankle fracture, left 02/19/2015   from a fall   Anxiety    Cataract 2014   corrected with surgery   CHF (congestive heart failure) (HCC)    CKD (chronic kidney disease) stage 3, GFR 30-59 ml/min [facility])    saw nephrologist Dr Juline   Community acquired pneumonia 11/29/2022   Multifocal s/p hospitalization 11/2022     COVID-19 09/21/2021   DDD (degenerative disc disease), cervical    Depression    Fibromyalgia    Glaucoma    s/p surgery, sees ophtho Q6 mo   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 Thought benign.    Osteoarthritis     shoulders and knees, not RA per Dr Dolphus, positive ANA, positive Ro   Osteopenia 09/18/2015   DEXA T -1.1 hip, -0.2 spine 08/2015    Personal history of urinary calculi latest 2014   Pneumonia 12/02/2011   PONV (postoperative nausea and vomiting)    Refusal of blood transfusions as patient is Jehovah's Witness    Rheumatic heart disease 1980   s/p mitral valve repair 1980   Sjogren's syndrome (HCC)    Trimalleolar fracture of left ankle 02/23/2015    Current Outpatient Medications on File Prior to Visit  Medication Sig Dispense Refill   ALPRAZolam  (XANAX ) 0.5 MG tablet Take 1 tablet (0.5 mg total) by mouth daily. 30 tablet 2   amoxicillin  (AMOXIL ) 500 MG tablet Take 4 tablets 1 hour prior to dental work 4 tablet 6   antiseptic oral rinse (BIOTENE) LIQD 15 mLs by Mouth Rinse route 2 (two) times daily as needed for dry mouth.     apixaban  (ELIQUIS ) 5 MG TABS tablet TAKE 1 TABLET BY MOUTH TWICE  DAILY 200 tablet 1   cholecalciferol  (VITAMIN D ) 1000 UNITS tablet Take 1,000 Units by mouth daily.     cyanocobalamin  (VITAMIN B12) 1000 MCG tablet Take 1 tablet (1,000 mcg total) by mouth once a  week.     diltiazem  (CARDIZEM  CD) 360 MG 24 hr capsule TAKE 1 CAPSULE BY MOUTH DAILY 90 capsule 3   docusate sodium  (COLACE) 100 MG capsule Take 100 mg by mouth daily.     dofetilide  (TIKOSYN ) 250 MCG capsule Take 1 capsule (250 mcg total) by mouth 2 (two) times daily. 180 capsule 1   ezetimibe  (ZETIA ) 10 MG tablet Take 1 tablet (10 mg total) by mouth at bedtime. 100 tablet 3   furosemide  (LASIX ) 20 MG tablet Take 2 tablets (40 mg total) by mouth daily. 180 tablet 3   gabapentin  (NEURONTIN ) 300 MG capsule TAKE 2 CAPSULES BY MOUTH IN THE MORNING 1 CAPSULE BY  MOUTH IN THE AFTERNOON AND  2 CAPSULES BY MOUTH AT  BEDTIME 500 capsule 3   HYDROcodone -acetaminophen  (NORCO/VICODIN) 5-325 MG tablet Take 1 tablet by mouth every 6 (six) hours as needed for moderate pain (pain score 4-6). 12 tablet 0   loratadine   (CLARITIN ) 10 MG tablet Take 1 tablet (10 mg total) by mouth daily. 90 tablet 3   losartan  (COZAAR ) 100 MG tablet Take 0.5 tablets (50 mg total) by mouth daily. 50 tablet 3   magnesium  oxide (MAG-OX) 400 (240 Mg) MG tablet Take 1 tablet (400 mg total) by mouth daily.     metoprolol  tartrate (LOPRESSOR ) 50 MG tablet TAKE 1 AND 1/2 TABLETS BY MOUTH  TWICE DAILY 300 tablet 3   pantoprazole  (PROTONIX ) 40 MG tablet Take 1 tablet (40 mg total) by mouth daily. 100 tablet 3   Polyethyl Glycol-Propyl Glycol (SYSTANE) 0.4-0.3 % GEL ophthalmic gel Place 1 drop into both eyes 2 (two) times daily.      potassium chloride  (KLOR-CON  M) 10 MEQ tablet TAKE 1 TABLET BY MOUTH TWICE  DAILY 200 tablet 2   RESTASIS  0.05 % ophthalmic emulsion INSTILL ONE DROP IN BOTH  EYES TWO TIMES DAILY 180 each 0   thiamine  (VITAMIN B1) 100 MG tablet Take 1 tablet (100 mg total) by mouth once a week.     traMADol  (ULTRAM ) 50 MG tablet TAKE 1 TABLET BY MOUTH DAILY AS  NEEDED 30 tablet 0   zolpidem  (AMBIEN ) 10 MG tablet Take 1 tablet (10 mg total) by mouth at bedtime as needed for sleep. 30 tablet 2   No current facility-administered medications on file prior to visit.    Allergies  Allergen Reactions   Cymbalta [Duloxetine Hcl] Other (See Comments)    tachycardia   Trazodone  Other (See Comments)    Patient states had chest pain, face pain and heart rate increased   Statins Nausea Only and Other (See Comments)    Muscle cramps also   Sulfa Antibiotics Nausea And Vomiting    Assessment/Plan:  1. Hyperlipidemia - Patient last LDL 141 is above goal of <55.  Aggressive goal due to history of TIA. Unfortunately can not tolerate statins. Recommend addition of PCSK9i to ezetimibe  therapy.  Using demo pen, educated patient on mechanism of action, storage, site selection, administration, and possible side effects. Patient able to demonstrate in room. Will complete PA and contact patient with result.   Continue ezetimibe  10mg  once  daily Start Repatha/Praluent q 2 weeks Recheck lipid panel in 2-3 months  Chris Lidiya Reise, PharmD, [place], CDCES, CPP [facility] [address], [place], [place] [address] Phone: [phone]; Fax: [phone] 09/09/2023 8:31 AM

## 2023-09-09 NOTE — Telephone Encounter (Signed)
 Pharmacy Patient Advocate Encounter   Received notification from Physician's Office that prior authorization for REPATHA is required/requested.   Insurance verification completed.   The patient is insured through [facility] .   Per test claim: PA required; PA submitted to above mentioned insurance via CoverMyMeds Key/confirmation #/EOC [id number] Status is pending

## 2023-09-09 NOTE — Telephone Encounter (Signed)
 Pharmacy Patient Advocate Encounter  Received notification from [facility] MEDICARE that Prior Authorization for REPATHA has been APPROVED from 09/09/23 to 03/10/24. Ran test claim, Copay is $0. This test claim was processed through [facility]- copay amounts may vary at other pharmacies due to pharmacy/plan contracts, or as the patient moves through the different stages of their insurance plan.

## 2023-09-09 NOTE — Telephone Encounter (Signed)
 Contacted patient to let her know Repatha approved. No answer, lmom

## 2023-09-09 NOTE — Telephone Encounter (Signed)
 Please complete PA for repatha/praluent

## 2023-09-09 NOTE — Telephone Encounter (Signed)
 PA request has been Submitted. New Encounter has been or will be created for follow up. For additional info see Pharmacy Prior Auth telephone encounter from 09/09/23.

## 2023-09-10 ENCOUNTER — Ambulatory Visit (INDEPENDENT_AMBULATORY_CARE_PROVIDER_SITE_OTHER): Admitting: Family Medicine

## 2023-09-10 ENCOUNTER — Encounter: Payer: Self-pay | Admitting: Family Medicine

## 2023-09-10 VITALS — BP 136/70 | HR 71 | Temp 98.4°F | Ht 61.0 in | Wt 190.0 lb

## 2023-09-10 DIAGNOSIS — Z7901 Long term (current) use of anticoagulants: Secondary | ICD-10-CM | POA: Diagnosis not present

## 2023-09-10 DIAGNOSIS — R0789 Other chest pain: Secondary | ICD-10-CM | POA: Diagnosis not present

## 2023-09-10 DIAGNOSIS — N1832 Chronic kidney disease, stage 3b: Secondary | ICD-10-CM | POA: Diagnosis not present

## 2023-09-10 DIAGNOSIS — K219 Gastro-esophageal reflux disease without esophagitis: Secondary | ICD-10-CM | POA: Diagnosis not present

## 2023-09-10 DIAGNOSIS — W19XXXD Unspecified fall, subsequent encounter: Secondary | ICD-10-CM | POA: Diagnosis not present

## 2023-09-10 DIAGNOSIS — Z87442 Personal history of urinary calculi: Secondary | ICD-10-CM | POA: Diagnosis not present

## 2023-09-10 DIAGNOSIS — M797 Fibromyalgia: Secondary | ICD-10-CM | POA: Diagnosis not present

## 2023-09-10 DIAGNOSIS — E7800 Pure hypercholesterolemia, unspecified: Secondary | ICD-10-CM | POA: Diagnosis not present

## 2023-09-10 DIAGNOSIS — I5032 Chronic diastolic (congestive) heart failure: Secondary | ICD-10-CM | POA: Diagnosis not present

## 2023-09-10 DIAGNOSIS — I484 Atypical atrial flutter: Secondary | ICD-10-CM | POA: Diagnosis not present

## 2023-09-10 DIAGNOSIS — I4891 Unspecified atrial fibrillation: Secondary | ICD-10-CM | POA: Diagnosis not present

## 2023-09-10 DIAGNOSIS — G6289 Other specified polyneuropathies: Secondary | ICD-10-CM | POA: Diagnosis not present

## 2023-09-10 DIAGNOSIS — Z951 Presence of aortocoronary bypass graft: Secondary | ICD-10-CM | POA: Diagnosis not present

## 2023-09-10 DIAGNOSIS — I130 Hypertensive heart and chronic kidney disease with heart failure and stage 1 through stage 4 chronic kidney disease, or unspecified chronic kidney disease: Secondary | ICD-10-CM | POA: Diagnosis not present

## 2023-09-10 DIAGNOSIS — M3501 Sicca syndrome with keratoconjunctivitis: Secondary | ICD-10-CM | POA: Diagnosis not present

## 2023-09-10 DIAGNOSIS — M150 Primary generalized (osteo)arthritis: Secondary | ICD-10-CM | POA: Diagnosis not present

## 2023-09-10 DIAGNOSIS — G8929 Other chronic pain: Secondary | ICD-10-CM | POA: Diagnosis not present

## 2023-09-10 DIAGNOSIS — Z87440 Personal history of urinary (tract) infections: Secondary | ICD-10-CM | POA: Diagnosis not present

## 2023-09-10 NOTE — Progress Notes (Signed)
 Ph: [phone] Fax: [phone]   Patient ID: Alexandria Leach, female    DOB: 1950/12/24, 73 y.o.   MRN: [id number]  This visit was conducted in person.  BP 136/70   Pulse 71   Temp 98.4 F (36.9 C) (Oral)   Ht 5' 1 (1.549 m)   Wt 190 lb (86.2 kg)   SpO2 97%   BMI 35.90 kg/m    CC: ER f/u visit  Subjective:   HPI: Alexandria Leach is a 73 y.o. female presenting on 09/10/2023 for Hospitalization Follow-up (Seen on 09/07/23 at [facility] ED, dx chest wall pain. )   Recent [facility] ER visit for bilateral lateral rib pain with radiation to the back. Records reviewed. Had normal troponins x2, CBC, CMP with Cr 1.22 and GFR 47. Rib xrays - no fracture. Treated with IV fentanyl , discharged on hydrocodone  apap 5/325mg  #12 tablets.   She had recently started home health PT and OT. Completed home safety evaluation.  Working on Medical laboratory scientific officer with PT.  Working on shoulders and arms with OT.   She notes she's stopped taking tramadol  about a month ago due to possible side effects - GI upset/nausea.  Saw lipid clinic yesterday planning to start Repatha in addition to ezetimibe .      Relevant past medical, surgical, family and social history reviewed and updated as indicated. Interim medical history since our last visit reviewed. Allergies and medications reviewed and updated. Outpatient Medications Prior to Visit  Medication Sig Dispense Refill   ALPRAZolam  (XANAX ) 0.5 MG tablet Take 1 tablet (0.5 mg total) by mouth daily. 30 tablet 2   amoxicillin  (AMOXIL ) 500 MG tablet Take 4 tablets 1 hour prior to dental work 4 tablet 6   antiseptic oral rinse (BIOTENE) LIQD 15 mLs by Mouth Rinse route 2 (two) times daily as needed for dry mouth.     apixaban  (ELIQUIS ) 5 MG TABS tablet TAKE 1 TABLET BY MOUTH TWICE  DAILY 200 tablet 1   cholecalciferol  (VITAMIN D ) 1000 UNITS tablet Take 1,000 Units by mouth daily.     cyanocobalamin  (VITAMIN B12) 1000 MCG tablet Take 1 tablet  (1,000 mcg total) by mouth once a week.     diltiazem  (CARDIZEM  CD) 360 MG 24 hr capsule TAKE 1 CAPSULE BY MOUTH DAILY 90 capsule 3   docusate sodium  (COLACE) 100 MG capsule Take 100 mg by mouth daily.     dofetilide  (TIKOSYN ) 250 MCG capsule Take 1 capsule (250 mcg total) by mouth 2 (two) times daily. 180 capsule 1   Evolocumab (REPATHA SURECLICK) 140 MG/ML SOAJ Inject 140 mg into the skin every 14 (fourteen) days. 6 mL 3   ezetimibe  (ZETIA ) 10 MG tablet Take 1 tablet (10 mg total) by mouth at bedtime. 100 tablet 3   furosemide  (LASIX ) 20 MG tablet Take 2 tablets (40 mg total) by mouth daily. 180 tablet 3   gabapentin  (NEURONTIN ) 300 MG capsule TAKE 2 CAPSULES BY MOUTH IN THE MORNING 1 CAPSULE BY  MOUTH IN THE AFTERNOON AND  2 CAPSULES BY MOUTH AT  BEDTIME 500 capsule 3   HYDROcodone -acetaminophen  (NORCO/VICODIN) 5-325 MG tablet Take 1 tablet by mouth every 6 (six) hours as needed for moderate pain (pain score 4-6). 12 tablet 0   Lifitegrast (XIIDRA) 5 % SOLN Place 1 drop into both eyes 2 (two) times daily. 180 each 4   loratadine  (CLARITIN ) 10 MG tablet Take 1 tablet (10 mg total) by mouth daily. 90 tablet 3   losartan  (COZAAR )  100 MG tablet Take 0.5 tablets (50 mg total) by mouth daily. 50 tablet 3   magnesium  oxide (MAG-OX) 400 (240 Mg) MG tablet Take 1 tablet (400 mg total) by mouth daily.     metoprolol  tartrate (LOPRESSOR ) 50 MG tablet TAKE 1 AND 1/2 TABLETS BY MOUTH  TWICE DAILY 300 tablet 3   pantoprazole  (PROTONIX ) 40 MG tablet Take 1 tablet (40 mg total) by mouth daily. 100 tablet 3   potassium chloride  (KLOR-CON  M) 10 MEQ tablet TAKE 1 TABLET BY MOUTH TWICE  DAILY 200 tablet 2   thiamine  (VITAMIN B1) 100 MG tablet Take 1 tablet (100 mg total) by mouth once a week.     traMADol  (ULTRAM ) 50 MG tablet TAKE 1 TABLET BY MOUTH DAILY AS  NEEDED 30 tablet 0   zolpidem  (AMBIEN ) 10 MG tablet Take 1 tablet (10 mg total) by mouth at bedtime as needed for sleep. 30 tablet 2   Polyethyl  Glycol-Propyl Glycol (SYSTANE) 0.4-0.3 % GEL ophthalmic gel Place 1 drop into both eyes 2 (two) times daily.      RESTASIS  0.05 % ophthalmic emulsion INSTILL ONE DROP IN BOTH  EYES TWO TIMES DAILY 180 each 0   No facility-administered medications prior to visit.     Per HPI unless specifically indicated in ROS section below Review of Systems  Objective:  BP 136/70   Pulse 71   Temp 98.4 F (36.9 C) (Oral)   Ht 5' 1 (1.549 m)   Wt 190 lb (86.2 kg)   SpO2 97%   BMI 35.90 kg/m   Wt Readings from Last 3 Encounters:  09/10/23 190 lb (86.2 kg)  09/07/23 193 lb (87.5 kg)  07/22/23 189 lb (85.7 kg)      Physical Exam Vitals and nursing note reviewed.  Constitutional:      Appearance: Normal appearance. She is not ill-appearing.  HENT:     Head: Normocephalic and atraumatic.     Mouth/Throat:     Comments: Wearing mask  Cardiovascular:     Rate and Rhythm: Normal rate and regular rhythm.     Pulses: Normal pulses.     Heart sounds: Normal heart sounds. No murmur heard. Pulmonary:     Effort: Pulmonary effort is normal. No respiratory distress.     Breath sounds: Normal breath sounds. No wheezing, rhonchi or rales.  Chest:     Chest wall: No tenderness (no reproducible tenderness to palpation of sternum, costochodral joints, or lateral ribcage).   Musculoskeletal:     Right lower leg: No edema.     Left lower leg: No edema.   Skin:    General: Skin is warm and dry.     Findings: No rash.   Neurological:     Mental Status: She is alert.   Psychiatric:        Mood and Affect: Mood normal.        Behavior: Behavior normal.       Results for orders placed or performed during the hospital encounter of 09/07/23  CBC with Differential/Platelet   Collection Time: 09/07/23  9:22 AM  Result Value Ref Range   WBC 5.3 4.0 - 10.5 K/uL   RBC 4.56 3.87 - 5.11 MIL/uL   Hemoglobin 12.5 12.0 - 15.0 g/dL   HCT 61.7 63.9 - 53.9 %   MCV 83.8 80.0 - 100.0 fL   MCH 27.4 26.0 -  34.0 pg   MCHC 32.7 30.0 - 36.0 g/dL   RDW 85.4 88.4 -  15.5 %   Platelets 229 150 - 400 K/uL   nRBC 0.0 0.0 - 0.2 %   Neutrophils Relative % 51 %   Neutro Abs 2.7 1.7 - 7.7 K/uL   Lymphocytes Relative 35 %   Lymphs Abs 1.8 0.7 - 4.0 K/uL   Monocytes Relative 10 %   Monocytes Absolute 0.5 0.1 - 1.0 K/uL   Eosinophils Relative 3 %   Eosinophils Absolute 0.1 0.0 - 0.5 K/uL   Basophils Relative 1 %   Basophils Absolute 0.0 0.0 - 0.1 K/uL   Immature Granulocytes 0 %   Abs Immature Granulocytes 0.01 0.00 - 0.07 K/uL  Comprehensive metabolic panel with GFR   Collection Time: 09/07/23 10:21 AM  Result Value Ref Range   Sodium 143 135 - 145 mmol/L   Potassium 3.6 3.5 - 5.1 mmol/L   Chloride 111 98 - 111 mmol/L   CO2 19 (L) 22 - 32 mmol/L   Glucose, Bld 83 70 - 99 mg/dL   BUN 12 8 - 23 mg/dL   Creatinine, Ser 8.77 (H) 0.44 - 1.00 mg/dL   Calcium 8.6 (L) 8.9 - 10.3 mg/dL   Total Protein 6.4 (L) 6.5 - 8.1 g/dL   Albumin 3.6 3.5 - 5.0 g/dL   AST 22 15 - 41 U/L   ALT 12 0 - 44 U/L   Alkaline Phosphatase 61 38 - 126 U/L   Total Bilirubin 0.3 0.0 - 1.2 mg/dL   GFR, Estimated 47 (L) >60 mL/min   Anion gap 12 5 - 15  Troponin T, High Sensitivity   Collection Time: 09/07/23 10:21 AM  Result Value Ref Range   Troponin T High Sensitivity <15 <19 ng/L  Troponin T, High Sensitivity   Collection Time: 09/07/23 11:47 AM  Result Value Ref Range   Troponin T High Sensitivity <15 <19 ng/L   Lab Results  Component Value Date   CHOL 222 (H) 07/08/2023   HDL 65 07/08/2023   LDLCALC 141 (H) 07/08/2023   LDLDIRECT 77.0 12/09/2016   TRIG 93 07/08/2023   CHOLHDL 3.4 07/08/2023   Assessment & Plan:   Problem List Items Addressed This Visit     HYPERCHOLESTEROLEMIA   Statin intolerance.  Continues ezetimibe , recently saw the lipid clinic, planning to start Repatha.  Appreciate their assistance in getting this medication covered. The 10-year ASCVD risk score (Arnett DK, et al., 2019) is:  15.7%   Values used to calculate the score:     Age: 78 years     Clincally relevant sex: Female     Is Non-Hispanic African American: Yes     Diabetic: No     Tobacco smoker: No     Systolic Blood Pressure: 136 mmHg     Is BP treated: Yes     HDL Cholesterol: 65 mg/dL     Total Cholesterol: 222 mg/dL      Chest wall pain - Primary   ER records reviewed, discussed with patient symptoms most consistent musculoskeletal cause like rib cage strain after working with therapists. Reassuringly symptoms are improving each day. Discussed further supportive measures including heating pad use. Update if not continuing to improve as expected.        No orders of the defined types were placed in this encounter.   No orders of the defined types were placed in this encounter.   Patient Instructions  I agree recent lateral ribcage pain likely due to muscular strain from working these muscles out more recently. Reassuringly you  are improving each day.  May continue heating pad as needed.  Let us  know if any worsening again I hope you do well with Repatha!  Follow up plan: No follow-ups on file.  Anton Blas, MD

## 2023-09-10 NOTE — Assessment & Plan Note (Addendum)
 ER records reviewed, discussed with patient symptoms most consistent musculoskeletal cause like rib cage strain after working with therapists. Reassuringly symptoms are improving each day. Discussed further supportive measures including heating pad use. Update if not continuing to improve as expected.

## 2023-09-10 NOTE — Patient Instructions (Addendum)
 I agree recent lateral ribcage pain likely due to muscular strain from working these muscles out more recently. Reassuringly you are improving each day.  May continue heating pad as needed.  Let us  know if any worsening again I hope you do well with Repatha!

## 2023-09-10 NOTE — Assessment & Plan Note (Addendum)
 Statin intolerance.  Continues ezetimibe , recently saw the lipid clinic, planning to start Repatha.  Appreciate their assistance in getting this medication covered. The 10-year ASCVD risk score (Arnett DK, et al., 2019) is: 15.7%   Values used to calculate the score:     Age: 73 years     Clincally relevant sex: Female     Is Non-Hispanic African American: Yes     Diabetic: No     Tobacco smoker: No     Systolic Blood Pressure: 136 mmHg     Is BP treated: Yes     HDL Cholesterol: 65 mg/dL     Total Cholesterol: 222 mg/dL

## 2023-09-11 ENCOUNTER — Ambulatory Visit: Attending: Physician Assistant | Admitting: Physician Assistant

## 2023-09-11 ENCOUNTER — Other Ambulatory Visit: Payer: Self-pay

## 2023-09-11 ENCOUNTER — Encounter: Payer: Self-pay | Admitting: Physician Assistant

## 2023-09-11 VITALS — BP 131/81 | HR 64 | Resp 16 | Ht 61.0 in | Wt 189.6 lb

## 2023-09-11 DIAGNOSIS — G4709 Other insomnia: Secondary | ICD-10-CM | POA: Diagnosis not present

## 2023-09-11 DIAGNOSIS — M1711 Unilateral primary osteoarthritis, right knee: Secondary | ICD-10-CM | POA: Diagnosis not present

## 2023-09-11 DIAGNOSIS — M25571 Pain in right ankle and joints of right foot: Secondary | ICD-10-CM

## 2023-09-11 DIAGNOSIS — M19041 Primary osteoarthritis, right hand: Secondary | ICD-10-CM

## 2023-09-11 DIAGNOSIS — Z79899 Other long term (current) drug therapy: Secondary | ICD-10-CM

## 2023-09-11 DIAGNOSIS — M5030 Other cervical disc degeneration, unspecified cervical region: Secondary | ICD-10-CM | POA: Diagnosis not present

## 2023-09-11 DIAGNOSIS — G8929 Other chronic pain: Secondary | ICD-10-CM | POA: Diagnosis not present

## 2023-09-11 DIAGNOSIS — M797 Fibromyalgia: Secondary | ICD-10-CM

## 2023-09-11 DIAGNOSIS — M3501 Sicca syndrome with keratoconjunctivitis: Secondary | ICD-10-CM

## 2023-09-11 DIAGNOSIS — M51369 Other intervertebral disc degeneration, lumbar region without mention of lumbar back pain or lower extremity pain: Secondary | ICD-10-CM | POA: Diagnosis not present

## 2023-09-11 DIAGNOSIS — Z8679 Personal history of other diseases of the circulatory system: Secondary | ICD-10-CM

## 2023-09-11 DIAGNOSIS — M8589 Other specified disorders of bone density and structure, multiple sites: Secondary | ICD-10-CM

## 2023-09-11 DIAGNOSIS — M19042 Primary osteoarthritis, left hand: Secondary | ICD-10-CM

## 2023-09-11 DIAGNOSIS — R5383 Other fatigue: Secondary | ICD-10-CM | POA: Diagnosis not present

## 2023-09-11 DIAGNOSIS — Z8659 Personal history of other mental and behavioral disorders: Secondary | ICD-10-CM

## 2023-09-11 DIAGNOSIS — Z7901 Long term (current) use of anticoagulants: Secondary | ICD-10-CM

## 2023-09-11 DIAGNOSIS — I483 Typical atrial flutter: Secondary | ICD-10-CM

## 2023-09-11 DIAGNOSIS — M7061 Trochanteric bursitis, right hip: Secondary | ICD-10-CM

## 2023-09-11 DIAGNOSIS — Z8639 Personal history of other endocrine, nutritional and metabolic disease: Secondary | ICD-10-CM

## 2023-09-11 DIAGNOSIS — N1831 Chronic kidney disease, stage 3a: Secondary | ICD-10-CM

## 2023-09-11 DIAGNOSIS — Z86711 Personal history of pulmonary embolism: Secondary | ICD-10-CM

## 2023-09-11 DIAGNOSIS — E041 Nontoxic single thyroid nodule: Secondary | ICD-10-CM

## 2023-09-11 NOTE — Patient Instructions (Signed)
 Hip Bursitis Rehab Ask your health care provider which exercises are safe for you. Do exercises exactly as told by your health care provider and adjust them as directed. It is normal to feel mild stretching, pulling, tightness, or discomfort as you do these exercises. Stop right away if you feel sudden pain or your pain gets worse. Do not begin these exercises until told by your health care provider. Stretching exercise This exercise warms up your muscles and joints and improves the movement and flexibility of your hip. This exercise also helps to relieve pain and stiffness. Iliotibial band stretch An iliotibial band is a strong band of muscle tissue that runs from the outer side of your hip to the outer side of your thigh and knee. Lie on your side with your left / right leg in the top position. Bend your left / right knee and grab your ankle. Stretch out your bottom arm to help you balance. Slowly bring your knee back so your thigh is slightly behind your body. Slowly lower your knee toward the floor until you feel a gentle stretch on the outside of your left / right thigh. If you do not feel a stretch and your knee will not lower more toward the floor, place the heel of your other foot on top of your knee and pull your knee down toward the floor with your foot. Hold this position for __________ seconds. Slowly return to the starting position. Repeat __________ times. Complete this exercise __________ times a day. Strengthening exercises These exercises build strength and endurance in your hip and pelvis. Endurance is the ability to use your muscles for a long time, even after they get tired. Bridge This exercise strengthens the muscles that move your thigh backward (hip extensors). Lie on your back on a firm surface with your knees bent and your feet flat on the floor. Tighten your buttocks muscles and lift your buttocks off the floor until your trunk is level with your thighs. Do not arch your  back. You should feel the muscles working in your buttocks and the back of your thighs. If you do not feel these muscles, slide your feet 1-2 inches (2.5-5 cm) farther away from your buttocks. If this exercise is too easy, try doing it with your arms crossed over your chest. Hold this position for __________ seconds. Slowly lower your hips to the starting position. Let your muscles relax completely after each repetition. Repeat __________ times. Complete this exercise __________ times a day. Squats This exercise strengthens the muscles in front of your thigh and knee (quadriceps). Stand in front of a table, with your feet and knees pointing straight ahead. You may rest your hands on the table for balance but not for support. Slowly bend your knees and lower your hips like you are going to sit in a chair. Keep your weight over your heels, not over your toes. Keep your lower legs upright so they are parallel with the table legs. Do not let your hips go lower than your knees. Do not bend lower than told by your health care provider. If your hip pain increases, do not bend as low. Hold the squat position for __________ seconds. Slowly push with your legs to return to standing. Do not use your hands to pull yourself to standing. Repeat __________ times. Complete this exercise __________ times a day. Hip hike  Stand sideways on a bottom step. Stand on your left / right leg with your other foot unsupported next to  the step. You can hold on to the railing or wall for balance if needed. Keep your knees straight and your torso square. Then lift your left / right hip up toward the ceiling. Hold this position for __________ seconds. Slowly let your left / right hip lower toward the floor, past the starting position. Your foot should get closer to the floor. Do not lean or bend your knees. Repeat __________ times. Complete this exercise __________ times a day. Single leg stand This exercise increases  your balance. Without shoes, stand near a railing or in a doorway. You may hold on to the railing or door frame as needed for balance. Squeeze your left / right buttock muscles, then lift up your other foot. Do not let your left / right hip push out to the side. It is helpful to stand in front of a mirror for this exercise so you can watch your hip. Hold this position for __________ seconds. Repeat __________ times. Complete this exercise __________ times a day. This information is not intended to replace advice given to you by your health care provider. Make sure you discuss any questions you have with your health care provider. Document Revised: 02/14/2021 Document Reviewed: 02/14/2021 Elsevier Patient Education  2024 [employer].

## 2023-09-11 NOTE — Progress Notes (Signed)
 Referral place per Waddell Craze.

## 2023-09-12 ENCOUNTER — Other Ambulatory Visit: Payer: Self-pay

## 2023-09-12 ENCOUNTER — Telehealth: Payer: Self-pay

## 2023-09-12 ENCOUNTER — Ambulatory Visit: Payer: 59 | Admitting: Physician Assistant

## 2023-09-12 NOTE — Patient Outreach (Signed)
 Complex Care Management   Visit Note  09/12/2023  Name:  Alexandria Leach MRN: [id number] DOB: 08/30/1950  Situation: Referral received for Complex Care Management related to Heart Failure and Fibromyalgia I obtained verbal consent from Patient.  Visit completed with Patient  on the phone  Background:   Past Medical History:  Diagnosis Date   Ankle fracture, left 02/19/2015   from a fall   Anxiety    Cataract 2014   corrected with surgery   CHF (congestive heart failure) (HCC)    CKD (chronic kidney disease) stage 3, GFR 30-59 ml/min [facility])    saw nephrologist Dr Juline   Community acquired pneumonia 11/29/2022   Multifocal s/p hospitalization 11/2022     COVID-19 09/21/2021   DDD (degenerative disc disease), cervical    Depression    Fibromyalgia    Glaucoma    s/p surgery, sees ophtho Q6 mo   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 Thought benign.    Osteoarthritis    shoulders and knees, not RA per Dr Dolphus, positive ANA, positive Ro   Osteopenia 09/18/2015   DEXA T -1.1 hip, -0.2 spine 08/2015    Personal history of urinary calculi latest 2014   Pneumonia 12/02/2011   PONV (postoperative nausea and vomiting)    Refusal of blood transfusions as patient is Jehovah's Witness    Rheumatic heart disease 1980   s/p mitral valve repair 1980   Sjogren's syndrome (HCC)    Trimalleolar fracture of left ankle 02/23/2015    Assessment: Patient Reported Symptoms:  Cognitive Cognitive Status: Alert and oriented to person, place, and time, Insightful and able to interpret abstract concepts, Normal speech and language  skills   Health Maintenance Behaviors: Annual physical exam, Exercise, Healthy diet, Immunizations, Sleep adequate Healing Pattern: Slow Health Facilitated by: Pain control, Rest, Healthy diet  Neurological Neurological Review of Symptoms: No symptoms reported Neurological Management Strategies: Medication therapy, Activity, Adequate rest Neurological Comment: Neuropathy - still pain at times but provider did not want to increase gabapentin ,  HEENT HEENT Symptoms Reported:  (dry eyes) Other HEENT Symptoms/Conditions: new eye drops for dry eyes with improvement HEENT Management Strategies: Routine screening, Medication therapy HEENT Self-Management Outcome: 4 (good) HEENT Comment: new drops really helping - things seem brighter    Cardiovascular Cardiovascular Symptoms Reported: No symptoms reported Does patient have uncontrolled Hypertension?: No Cardiovascular Conditions: Heart failure, Hypertension Cardiovascular Management Strategies: Medication therapy, Routine screening, Diet modification Cardiovascular Comment: no recent HF symptoms  Respiratory Respiratory Symptoms Reported: No symptoms reported    Endocrine Patient reports the following symptoms related to hypoglycemia or hyperglycemia : No symptoms reported Is patient diabetic?: No    Gastrointestinal Gastrointestinal Symptoms Reported: No symptoms reported   Nutrition Risk Screen (CP): No indicators present  Genitourinary Genitourinary Symptoms Reported: No symptoms reported    Integumentary Integumentary Symptoms Reported: No symptoms reported    Musculoskeletal Musculoskelatal Symptoms Reviewed: Difficulty walking, Muscle pain, Unsteady gait, Weakness Additional Musculoskeletal Details: related to OA and Fibromyalgia Musculoskeletal Conditions: Fibromyalgia, Osteoarthritis Other Musculoskeletal Conditions: Sjogrens Musculoskeletal Management Strategies: Adequate rest, Activity, Exercise,  Medical device, Medication  therapy, Routine screening Musculoskeletal Comment: states fibrimyalgia pain is worsening over last 6 mos, decrease in functional status. Referred to pain Management yesterday Falls in the past year?: Yes (last fall about 3 Ralph Benavidez ago, hit arm on wall, resolved) Number of falls in past year: 2 or more Was there an injury with Fall?: No Fall Risk Category Calculator: 2 Patient Fall Risk Level: Moderate Fall Risk Patient at Risk for Falls Due to: History of fall(s), Impaired balance/gait, Impaired mobility, Impaired vision Fall risk Follow up: Falls evaluation completed  Psychosocial Psychosocial Symptoms Reported: No symptoms reported Additional Psychological Details: Mental Health - Counselor through insurance Behavioral Health Conditions: Anxiety, Depression Behavioral Management Strategies: Activity, Counseling, Medication therapy Techniques to Cope with Loss/Stress/Change: Counseling, Medication Quality of Family Relationships: helpful, involved Do you feel physically threatened by others?: No      09/12/2023    2:29 PM  Depression screen PHQ 2/9  Decreased Interest 0  Down, Depressed, Hopeless 0  PHQ - 2 Score 0    Vitals:    Medications Reviewed Today     Reviewed by Devra Lands, RN (Registered Nurse) on 09/12/23 at 1415  Med List Status: <None>   Medication Order Taking? Sig Documenting Provider Last Dose Status Informant  ALPRAZolam  (XANAX ) 0.5 MG tablet [id number] Yes Take 1 tablet (0.5 mg total) by mouth daily. Okey Barnie SAUNDERS, MD  Active   amoxicillin  (AMOXIL ) 500 MG tablet [id number]  Take 4 tablets 1 hour prior to dental work Pietro Redell RAMAN, MD  Active   antiseptic oral rinse KEENAN) LIQD [id number] Yes 15 mLs by Mouth Rinse route 2 (two) times daily as needed for dry mouth. [provider]  Active Self  apixaban  (ELIQUIS ) 5 MG TABS tablet [id number] Yes TAKE 1 TABLET BY MOUTH TWICE  DAILY Crenshaw, Redell RAMAN, MD  Active   cholecalciferol  (VITAMIN D ) 1000  UNITS tablet [id number] Yes Take 1,000 Units by mouth daily. [provider]  Active Self  cyanocobalamin  (VITAMIN B12) 1000 MCG tablet [id number] Yes Take 1 tablet (1,000 mcg total) by mouth once a week. Rilla Baller, MD  Active Self           Med Note (CARD, AMY LITTIE   [place] Nov 29, 2022  9:38 PM) Emily on Friday.  diltiazem  (CARDIZEM  CD) 360 MG 24 hr capsule [id number] Yes TAKE 1 CAPSULE BY MOUTH DAILY Crenshaw, Redell RAMAN, MD  Active   docusate sodium  (COLACE) 100 MG capsule [id number] Yes Take 100 mg by mouth daily. [provider]  Active Self  dofetilide  (TIKOSYN ) 250 MCG capsule [id number] Yes Take 1 capsule (250 mcg total) by mouth 2 (two) times daily. Fenton, Clint R, PA  Active   Evolocumab  (REPATHA  SURECLICK) 140 MG/ML SOAJ [id number] Yes Inject 140 mg into the skin every 14 (fourteen) days. Pietro Redell RAMAN, MD  Active   ezetimibe  (ZETIA ) 10 MG tablet [id number] Yes Take 1 tablet (10 mg total) by mouth at bedtime. Rilla Baller, MD  Active   furosemide  (LASIX ) 20 MG tablet [id number] Yes Take 2 tablets (40 mg total) by mouth daily. Pietro Redell RAMAN, MD  Active   gabapentin  (NEURONTIN ) 300 MG capsule [id number] Yes TAKE 2 CAPSULES BY MOUTH IN THE MORNING 1 CAPSULE BY  MOUTH IN THE AFTERNOON AND  2 CAPSULES BY MOUTH AT  BEDTIME Rilla Baller, MD  Active   HYDROcodone -acetaminophen  (NORCO/VICODIN) 5-325 MG tablet [id number] Yes Take 1 tablet by mouth every 6 (six) hours as needed for moderate  pain (pain score 4-6). Zackowski, Scott, MD  Active   Lifitegrast  (XIIDRA ) 5 % SOLN [id number] Yes Place 1 drop into both eyes 2 (two) times daily.   Active   loratadine  (CLARITIN ) 10 MG tablet [id number] Yes Take 1 tablet (10 mg total) by mouth daily. Rilla Baller, MD  Active Self  losartan  (COZAAR ) 100 MG tablet [id number] Yes Take 0.5 tablets (50 mg total) by mouth daily. Rilla Baller, MD  Active   magnesium  oxide (MAG-OX) 400 (240 Mg) MG tablet [id number] Yes Take 1 tablet (400  mg total) by mouth daily. Rilla Baller, MD  Active   metoprolol  tartrate (LOPRESSOR ) 50 MG tablet [id number] Yes TAKE 1 AND 1/2 TABLETS BY MOUTH  TWICE DAILY Pietro Redell RAMAN, MD  Active   pantoprazole  (PROTONIX ) 40 MG tablet [id number] Yes Take 1 tablet (40 mg total) by mouth daily. Rilla Baller, MD  Active   potassium chloride  (KLOR-CON  M) 10 MEQ tablet [id number] Yes TAKE 1 TABLET BY MOUTH TWICE  DAILY Rilla Baller, MD  Active   thiamine  (VITAMIN B1) 100 MG tablet [id number] Yes Take 1 tablet (100 mg total) by mouth once a week. Rilla Baller, MD  Active Self           Med Note (CARD, AMY LITTIE   [place] Nov 29, 2022  9:41 PM) Emily on Mondays.  traMADol  (ULTRAM ) 50 MG tablet [id number]  TAKE 1 TABLET BY MOUTH DAILY AS  NEEDED  Patient not taking: Reported on 09/11/2023   Cheryl Waddell HERO, PA-C  Active   zolpidem  (AMBIEN ) 10 MG tablet [id number] Yes Take 1 tablet (10 mg total) by mouth at bedtime as needed for sleep. Okey Barnie SAUNDERS, MD  Active             Recommendation:   PCP Follow-up Continue Current Plan of Care  Follow Up Plan:   Telephone follow-up in 1 month  Nestora Duos, MSN, RN [facility]  [facility] RN Care Manager Direct Dial: [phone] Fax: [phone]

## 2023-09-12 NOTE — Patient Instructions (Signed)
 Visit Information  Thank you for taking time to visit with me today. Please don't hesitate to contact me if I can be of assistance to you before our next scheduled appointment.  Your next care management appointment is by telephone on 10/10/2023 at 10:00 am  Telephone follow-up in 1 month  Please call the care guide team at [phone] if you need to cancel, schedule, or reschedule an appointment.   Please call the Suicide and Crisis Lifeline: 988 call the [place]  National Suicide Prevention Lifeline: (732)150-5424 or TTY: 279-836-9240 TTY 6163237359) to talk to a trained counselor call 1-800-273-TALK (toll free, 24 hour hotline) go to [facility] [address] [place] [phone]) call 911 if you are experiencing a Mental Health or Behavioral Health Crisis or need someone to talk to.  Nestora Duos, MSN, RN [place]  [facility] [address], [facility] RN Care Manager Direct Dial: [phone] Fax: [phone]   Heart Failure Action Plan A heart failure action plan helps you know what to do when you have symptoms of heart failure. Your action plan is a color-coded plan that lists the symptoms to watch for and indicates what actions to take. If you have symptoms in the green zone, you're doing well. If you have symptoms in the yellow zone, you're having problems. If you have symptoms in the red zone, you need medical care right away. Follow the plan that was created by you and your health care provider. Review your plan each time you visit your provider. Green zone These signs mean you're doing well and can continue what you're doing: You don't have new or worsening shortness of breath. You have very little swelling or no new swelling. Your weight is stable (no gain or loss). You have a normal activity level. You don't have chest pain or any other new symptoms. Yellow zone These signs and symptoms mean your condition  may be getting worse and you should make some changes: You have trouble breathing when you're active. You have swelling in your feet or legs or have discomfort in your belly. You gain 2-3 lb (0.9-1.4 kg) in 24 hours, or 5 lb (2.3 kg) in a week. This amount may be more or less depending on your condition. You get tired easily. You have trouble sleeping. You have a dry cough. If you have any of these symptoms: Contact your provider within the next day. Your provider may adjust your medicines. Red zone These signs and symptoms mean you should get medical help right away: You have trouble breathing when resting or cannot lie flat and you need to raise your head to help you breathe. You have a dry cough that's getting worse. You have swelling or pain in your feet or legs or discomfort in your belly that's getting worse. You suddenly gain more than 2-3 lb (0.9-1.4 kg) in 24 hours, or more than 5 lb (2.3 kg) in a week. This amount may be more or less depending on your condition. You have trouble staying awake or you feel confused. You don't have an appetite. You have worsening sadness or depression. These symptoms may be an emergency. Call 911 right away. Do not wait to see if the symptoms will go away. Do not drive yourself to the hospital. Follow these instructions at home: Take medicines only as told. Eat a heart-healthy diet. Work with a dietitian to create an eating plan that's best for you. Weigh yourself each day. Your target weight is __________ lb (__________  kg). Call your provider if you gain more than __________ lb (__________ kg) in 24 hours, or more than __________ lb (__________ kg) in a week. Health care provider name: _____________________________________________________ Health care provider phone number: _____________________________________________________ Where to find more information American Heart Association: heart.org This information is not intended to replace advice  given to you by your health care provider. Make sure you discuss any questions you have with your health care provider. Document Revised: 10/17/2022 Document Reviewed: 10/17/2022 Elsevier Patient Education  2024 [employer].

## 2023-09-14 ENCOUNTER — Ambulatory Visit: Payer: Self-pay | Admitting: Physician Assistant

## 2023-09-14 LAB — SJOGRENS SYNDROME-A EXTRACTABLE NUCLEAR ANTIBODY: SSA (Ro) (ENA) Antibody, IgG: >8 AI — AB

## 2023-09-14 LAB — C3 AND C4
C3 Complement: 165 mg/dL (ref 83–193)
C4 Complement: 51 mg/dL (ref 15–57)

## 2023-09-14 LAB — PROTEIN ELECTROPHORESIS, SERUM, WITH REFLEX
Albumin ELP: 4.5 g/dL (ref 3.8–4.8)
Alpha 1: 0.3 g/dL (ref 0.2–0.3)
Alpha 2: 0.8 g/dL (ref 0.5–0.9)
Beta 2: 0.5 g/dL (ref 0.2–0.5)
Beta Globulin: 0.5 g/dL (ref 0.4–0.6)
Gamma Globulin: 1.3 g/dL (ref 0.8–1.7)
Total Protein: 7.9 g/dL (ref 6.1–8.1)

## 2023-09-14 LAB — ANTI-NUCLEAR AB-TITER (ANA TITER)
ANA TITER: 1:320 {titer} — ABNORMAL HIGH
ANA Titer 1: 1:80 {titer} — ABNORMAL HIGH

## 2023-09-14 LAB — ANTI-DNA ANTIBODY, DOUBLE-STRANDED: ds DNA Ab: <1 [IU]/mL

## 2023-09-14 LAB — PROTEIN / CREATININE RATIO, URINE
Creatinine, Urine: 291 mg/dL — ABNORMAL HIGH (ref 20–275)
Protein/Creat Ratio: 52 mg/g{creat} (ref 24–184)
Protein/Creatinine Ratio: 0.052 mg/mg{creat} (ref 0.024–0.184)
Total Protein, Urine: 15 mg/dL (ref 5–24)

## 2023-09-14 LAB — RHEUMATOID FACTOR: Rheumatoid fact SerPl-aCnc: <10 [IU]/mL (ref <14)

## 2023-09-14 LAB — ANA: Anti Nuclear Antibody (ANA): POSITIVE — AB

## 2023-09-14 LAB — SEDIMENTATION RATE: Sed Rate: 28 mm/h (ref 0–30)

## 2023-09-14 NOTE — Progress Notes (Signed)
 Urine protein creatinine ratio WNL Ro antibody remains positive.  ESR WNL RF negative  Complements WNL  dsDNA negative  SPEP normal  ANA pending

## 2023-09-14 NOTE — Progress Notes (Signed)
 ANA remains positive.

## 2023-09-15 DIAGNOSIS — M3501 Sicca syndrome with keratoconjunctivitis: Secondary | ICD-10-CM | POA: Diagnosis not present

## 2023-09-15 DIAGNOSIS — Z951 Presence of aortocoronary bypass graft: Secondary | ICD-10-CM | POA: Diagnosis not present

## 2023-09-15 DIAGNOSIS — I5032 Chronic diastolic (congestive) heart failure: Secondary | ICD-10-CM | POA: Diagnosis not present

## 2023-09-15 DIAGNOSIS — M150 Primary generalized (osteo)arthritis: Secondary | ICD-10-CM | POA: Diagnosis not present

## 2023-09-15 DIAGNOSIS — Z7901 Long term (current) use of anticoagulants: Secondary | ICD-10-CM | POA: Diagnosis not present

## 2023-09-15 DIAGNOSIS — G8929 Other chronic pain: Secondary | ICD-10-CM | POA: Diagnosis not present

## 2023-09-15 DIAGNOSIS — Z87440 Personal history of urinary (tract) infections: Secondary | ICD-10-CM | POA: Diagnosis not present

## 2023-09-15 DIAGNOSIS — W19XXXD Unspecified fall, subsequent encounter: Secondary | ICD-10-CM | POA: Diagnosis not present

## 2023-09-15 DIAGNOSIS — I484 Atypical atrial flutter: Secondary | ICD-10-CM | POA: Diagnosis not present

## 2023-09-15 DIAGNOSIS — G6289 Other specified polyneuropathies: Secondary | ICD-10-CM | POA: Diagnosis not present

## 2023-09-15 DIAGNOSIS — I130 Hypertensive heart and chronic kidney disease with heart failure and stage 1 through stage 4 chronic kidney disease, or unspecified chronic kidney disease: Secondary | ICD-10-CM | POA: Diagnosis not present

## 2023-09-15 DIAGNOSIS — I4891 Unspecified atrial fibrillation: Secondary | ICD-10-CM | POA: Diagnosis not present

## 2023-09-15 DIAGNOSIS — M797 Fibromyalgia: Secondary | ICD-10-CM | POA: Diagnosis not present

## 2023-09-15 DIAGNOSIS — E7800 Pure hypercholesterolemia, unspecified: Secondary | ICD-10-CM | POA: Diagnosis not present

## 2023-09-15 DIAGNOSIS — K219 Gastro-esophageal reflux disease without esophagitis: Secondary | ICD-10-CM | POA: Diagnosis not present

## 2023-09-15 DIAGNOSIS — N1832 Chronic kidney disease, stage 3b: Secondary | ICD-10-CM | POA: Diagnosis not present

## 2023-09-15 DIAGNOSIS — Z87442 Personal history of urinary calculi: Secondary | ICD-10-CM | POA: Diagnosis not present

## 2023-09-22 ENCOUNTER — Other Ambulatory Visit: Payer: Self-pay | Admitting: Family Medicine

## 2023-09-22 DIAGNOSIS — Z1231 Encounter for screening mammogram for malignant neoplasm of breast: Secondary | ICD-10-CM

## 2023-09-26 ENCOUNTER — Other Ambulatory Visit: Payer: Self-pay | Admitting: Family Medicine

## 2023-09-26 ENCOUNTER — Telehealth: Payer: Self-pay | Admitting: *Deleted

## 2023-09-26 DIAGNOSIS — Z87442 Personal history of urinary calculi: Secondary | ICD-10-CM | POA: Diagnosis not present

## 2023-09-26 DIAGNOSIS — I5032 Chronic diastolic (congestive) heart failure: Secondary | ICD-10-CM | POA: Diagnosis not present

## 2023-09-26 DIAGNOSIS — M3501 Sicca syndrome with keratoconjunctivitis: Secondary | ICD-10-CM | POA: Diagnosis not present

## 2023-09-26 DIAGNOSIS — Z7901 Long term (current) use of anticoagulants: Secondary | ICD-10-CM | POA: Diagnosis not present

## 2023-09-26 DIAGNOSIS — M150 Primary generalized (osteo)arthritis: Secondary | ICD-10-CM | POA: Diagnosis not present

## 2023-09-26 DIAGNOSIS — I484 Atypical atrial flutter: Secondary | ICD-10-CM | POA: Diagnosis not present

## 2023-09-26 DIAGNOSIS — Z951 Presence of aortocoronary bypass graft: Secondary | ICD-10-CM | POA: Diagnosis not present

## 2023-09-26 DIAGNOSIS — M797 Fibromyalgia: Secondary | ICD-10-CM | POA: Diagnosis not present

## 2023-09-26 DIAGNOSIS — G8929 Other chronic pain: Secondary | ICD-10-CM | POA: Diagnosis not present

## 2023-09-26 DIAGNOSIS — K219 Gastro-esophageal reflux disease without esophagitis: Secondary | ICD-10-CM | POA: Diagnosis not present

## 2023-09-26 DIAGNOSIS — N1832 Chronic kidney disease, stage 3b: Secondary | ICD-10-CM | POA: Diagnosis not present

## 2023-09-26 DIAGNOSIS — Z87440 Personal history of urinary (tract) infections: Secondary | ICD-10-CM | POA: Diagnosis not present

## 2023-09-26 DIAGNOSIS — W19XXXD Unspecified fall, subsequent encounter: Secondary | ICD-10-CM | POA: Diagnosis not present

## 2023-09-26 DIAGNOSIS — G6289 Other specified polyneuropathies: Secondary | ICD-10-CM | POA: Diagnosis not present

## 2023-09-26 DIAGNOSIS — E7800 Pure hypercholesterolemia, unspecified: Secondary | ICD-10-CM | POA: Diagnosis not present

## 2023-09-26 DIAGNOSIS — I130 Hypertensive heart and chronic kidney disease with heart failure and stage 1 through stage 4 chronic kidney disease, or unspecified chronic kidney disease: Secondary | ICD-10-CM | POA: Diagnosis not present

## 2023-09-26 DIAGNOSIS — I4891 Unspecified atrial fibrillation: Secondary | ICD-10-CM | POA: Diagnosis not present

## 2023-09-26 NOTE — Telephone Encounter (Signed)
 Per 01/20/23 OV notes, pt is off lovastatin  and only taking Zetia .   Request denied.

## 2023-09-26 NOTE — Telephone Encounter (Signed)
 Patient contacted the office to follow up if the referral for pain management had been placed. Returned call to patient to advise the referral has been placed. Provided patient with phone number so she may follow up on it.

## 2023-10-10 ENCOUNTER — Other Ambulatory Visit: Payer: Self-pay

## 2023-10-10 NOTE — Patient Instructions (Signed)
 Visit Information  Thank you for taking time to visit with me today. Please don't hesitate to contact me if I can be of assistance to you before our next scheduled appointment.  Your next care management appointment is by telephone on 11/06/2023 at 9:45 am  Telephone follow-up in 1 month  Please call the care guide team at [phone] if you need to cancel, schedule, or reschedule an appointment.   Please call the Suicide and Crisis Lifeline: 988 call the [place]  National Suicide Prevention Lifeline: (838)199-5698 or TTY: (226)428-9627 TTY 2405174930) to talk to a trained counselor call 1-800-273-TALK (toll free, 24 hour hotline) go to [facility] [address] [place] [phone]) call 911 if you are experiencing a Mental Health or Behavioral Health Crisis or need someone to talk to.  [place]and[place] Duos, MSN, RN [place]  [facility], [facility] RN Care Manager Direct Dial: [phone] Fax: [phone]  Heart Failure Action Plan A heart failure action plan helps you know what to do when you have symptoms of heart failure. Your action plan is a color-coded plan that lists the symptoms to watch for and indicates what actions to take. If you have symptoms in the green zone, you're doing well. If you have symptoms in the yellow zone, you're having problems. If you have symptoms in the red zone, you need medical care right away. Follow the plan that was created by you and your health care provider. Review your plan each time you visit your provider. Green zone These signs mean you're doing well and can continue what you're doing: You don't have new or worsening shortness of breath. You have very little swelling or no new swelling. Your weight is stable (no gain or loss). You have a normal activity level. You don't have chest pain or any other new symptoms. Yellow zone These signs and symptoms mean your condition may  be getting worse and you should make some changes: You have trouble breathing when you're active. You have swelling in your feet or legs or have discomfort in your belly. You gain 2-3 lb (0.9-1.4 kg) in 24 hours, or 5 lb (2.3 kg) in a week. This amount may be more or less depending on your condition. You get tired easily. You have trouble sleeping. You have a dry cough. If you have any of these symptoms: Contact your provider within the next day. Your provider may adjust your medicines. Red zone These signs and symptoms mean you should get medical help right away: You have trouble breathing when resting or cannot lie flat and you need to raise your head to help you breathe. You have a dry cough that's getting worse. You have swelling or pain in your feet or legs or discomfort in your belly that's getting worse. You suddenly gain more than 2-3 lb (0.9-1.4 kg) in 24 hours, or more than 5 lb (2.3 kg) in a week. This amount may be more or less depending on your condition. You have trouble staying awake or you feel confused. You don't have an appetite. You have worsening sadness or depression. These symptoms may be an emergency. Call 911 right away. Do not wait to see if the symptoms will go away. Do not drive yourself to the hospital. Follow these instructions at home: Take medicines only as told. Eat a heart-healthy diet. Work with a dietitian to create an eating plan that's best for you. Weigh yourself each day. Your target weight is __________ lb (__________ kg).  Call your provider if you gain more than __________ lb (__________ kg) in 24 hours, or more than __________ lb (__________ kg) in a week. Health care provider name: _____________________________________________________ Health care provider phone number: _____________________________________________________ Where to find more information American Heart Association: heart.org This information is not intended to replace advice  given to you by your health care provider. Make sure you discuss any questions you have with your health care provider. Document Revised: 10/17/2022 Document Reviewed: 10/17/2022 Elsevier Patient Education  2024 [employer].  Energy Conservation Techniques  Sit for as many activities as possible. Use slow, smooth movements.  Rushing increases discomfort. Determine the necessity of performing the task.  Simplify those tasks that are necessary.  (Get clothes out of the dryer when they are warm instead of ironing, let dishes air dry, etc.) Take frequent rests both during and between activities.  Avoid repetitive tasks. Pre-plan your activities; try a daily and/or weekly schedule.  Spread out the activities that are most fatiguing (break up cleaning tasks over multiple days). Remember to plan a balance of work, rest and recreation. Consider the best time for each activity.  Do the most exertive task when you have the most energy. Don't carry items if you can push them.  Slide, don't lift. Push, don't pull. Utilize two hands when appropriate. Maintain good posture and use proper body mechanics. Avoid remaining in one position for too long. When lifting, bend at the knees, not at the waist.  Exhale when bending down, inhale when straightening up. Carry objects as close to your body and as near to the center of the pelvis.  11. Avoid wasted body movements (position yourself for the task so that you avoid bending, twisting, etc.                when possible). 12. Select the best working environment.  Consider lighting, ventilation, clothing, and equipment. 13. Organize your storage areas, making the items you use daily convenient.  Store heaviest items at waist            height.  Store frequently used items between shoulders and knee height.  Consider leaving frequently used       items on countertops.  (You can organize in storage baskets based on time used/purpose). 14. Feelings and emotions can  be real causes of fatigue.  Try to avoid unnecessary worry, irritation, or                    frustration.  Avoid stress, it can also be a source of fatigue. 15. Get help from other people for difficult tasks. 16. Explore equipment or items that may be able to do the job for you with greater ease.  (Electric can        openers, blenders, lightweight items for cleaning, etc.)  Systemic Lupus Erythematosus, Adult Systemic lupus erythematosus (SLE) is a long-term (chronic) disease that can affect many parts of the body. SLE is an autoimmune disease. With this type of disease, the body's defense system (immune system) mistakenly attacks healthy tissues. This can cause damage to the skin, joints, blood vessels, brain, kidneys, lungs, heart, and other internal organs. It causes pain, irritation, and inflammation. What are the causes? The cause of this condition is not known. What increases the risk? The following factors may make you more likely to develop this condition: Being female. Being of Asian, Hispanic, or African American descent. Having a family history of the condition. Being exposed to tobacco  smoke or smoking cigarettes. Having an infection with a virus, such as Epstein-Barr virus. Having a history of exposure to silica dust, metals, chemicals, mold or mildew, or insecticides. Using oral contraceptives or hormone replacement therapy. What are the signs or symptoms? This condition can affect almost any organ or system in the body. Symptoms of the condition depend on which organ or system is affected. The most common symptoms include: Fever. Tiredness (fatigue). Weight loss. Muscle aches. Joint pain. Skin rashes, especially over the nose and cheeks (butterfly rash) and after sun exposure. Symptoms can come and go. A period of time when symptoms get worse or come back is called a flare. A period of time with no symptoms is called a remission. How is this diagnosed? This condition is  diagnosed based on: Your symptoms. Your medical history. A physical exam. You may also have tests, including: Blood tests. Urine tests. A chest X-ray. You may be referred to an autoimmune disease specialist (rheumatologist). How is this treated? There is no cure for this condition, but treatment can help to control symptoms, prevent flares (keep symptoms in remission), and prevent damage to the heart, lungs, kidneys, and other organs. Treatment will depend on what symptoms you are having and what organs or systems are affected. Treatment may involve taking a combination of medicines over time. Common medicines used to treat this condition include: Antimalarial medicines to control symptoms, prevent flares, and protect against organ damage. Corticosteroids and NSAIDs to reduce inflammation. Medicines to weaken your immune system (immunosuppressants). Biologic response modifiers to reduce inflammation and damage. Follow these instructions at home: Medicines Take over-the-counter and prescription medicines only as told by your health care provider. Do not take any medicines that contain estrogen without first checking with your health care provider. Estrogen can trigger flares and may increase your risk for blood clots. Eating and drinking Eat a heart-healthy diet. This may include: Eating high-fiber foods, such as beans, whole grains, and fresh fruits and vegetables. Eating heart-healthy fats (omega-3 fats), such as fish, flaxseed, and flaxseed oil. Limiting foods that are high in saturated fat and cholesterol, such as processed and fried foods, fatty meat, and full-fat dairy. Limiting how much salt (sodium) you eat. Include calcium and vitamin D  in your diet. Good sources of calcium and vitamin D  include: Low-fat dairy products such as milk, yogurt, and cheese. Certain fish, such as fresh or canned salmon, tuna, and sardines. Products that have calcium and vitamin D  added to them (are  fortified), such as fortified cereals or juice. Lifestyle     Stay active, as directed by your health care provider. Do not use any products that contain nicotine or tobacco. These products include cigarettes, chewing tobacco, and vaping devices, such as e-cigarettes. If you need help quitting, ask your health care provider. Protect your skin from the sun by applying sunblock and wearing protective hats and clothing. Learn as much as you can about your condition and have a good support system in place. Support may come from family, friends, or a lupus support group. General instructions Work closely with all of your health care providers to manage your condition. Stay up to date on all vaccines as told by your health care provider. Keep all follow-up visits. This is important. Contact a health care provider if: You have a fever. Your symptoms flare. You develop new symptoms. You have an upper respiratory infection. You have bloody, foamy, or coffee-colored urine. There are changes in your urination. For example, you urinate more  often at night. You think that you may be depressed or have anxiety. You become pregnant or plan to become pregnant. Pregnancy in women with this condition is considered high risk. Get help right away if: You have chest pain. You have trouble breathing. You have a seizure. You suddenly get a very bad headache. You suddenly develop facial or body weakness. You cannot speak. You cannot understand speech. These symptoms may be an emergency. Get help right away. Call 911. Do not wait to see if the symptoms will go away. Do not drive yourself to the hospital. Summary Systemic lupus erythematosus (SLE) is a long-term disease that can affect many parts of the body. SLE is an autoimmune disease. That means your body's defense system (immune system) mistakenly attacks healthy tissues. There is no cure for this condition, but treatment can help to control symptoms,  prevent flares, and prevent damage to your organs. Treatment may involve taking a combination of medicines over time. This information is not intended to replace advice given to you by your health care provider. Make sure you discuss any questions you have with your health care provider. Document Revised: 12/08/2020 Document Reviewed: 12/08/2020 Elsevier Patient Education  2024 [employer].

## 2023-10-10 NOTE — Patient Outreach (Signed)
 Complex Care Management   Visit Note  10/10/2023  Name:  Alexandria Leach MRN: [id number] DOB: [birth year]  Situation: Referral received for Complex Care Management related to Heart Failure and Fibromyalgia I obtained verbal consent from Patient.  Visit completed with Patient  on the phone  Background:   Past Medical History:  Diagnosis Date   Ankle fracture, left 02/19/2015   from a fall   Anxiety    Cataract 2014   corrected with surgery   CHF (congestive heart failure) (HCC)    CKD (chronic kidney disease) stage 3, GFR 30-59 ml/min [facility])    saw nephrologist Dr Juline   Community acquired pneumonia 11/29/2022   Multifocal s/p hospitalization 11/2022     COVID-19 09/21/2021   DDD (degenerative disc disease), cervical    Depression    Fibromyalgia    Glaucoma    s/p surgery, sees ophtho Q6 mo   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 Thought benign.    Osteoarthritis    shoulders and knees, not RA per Dr Dolphus, positive ANA, positive Ro   Osteopenia 09/18/2015   DEXA T -1.1 hip, -0.2 spine 08/2015    Personal history of urinary calculi latest 2014   Pneumonia 12/02/2011   PONV (postoperative nausea and vomiting)    Refusal of blood transfusions as patient is Jehovah's Witness    Rheumatic heart disease 1980   s/p mitral valve repair 1980   Sjogren's syndrome (HCC)    Trimalleolar fracture of left ankle 02/23/2015    Assessment: Patient Reported Symptoms:  Cognitive Cognitive Status: No symptoms reported Cognitive/Intellectual Conditions Management [RPT]: None reported or documented in medical history or problem  list   Health Maintenance Behaviors: Annual physical exam, Exercise, Healthy diet, Sleep adequate Healing Pattern: Slow Health Facilitated by: Pain control, Rest, Healthy diet  Neurological Neurological Review of Symptoms: No symptoms reported Neurological Management Strategies: Medication therapy, Activity, Adequate rest Neurological Comment: Neuropathy - no worse  HEENT HEENT Symptoms Reported: No symptoms reported HEENT Management Strategies: Routine screening, Medication therapy    Cardiovascular Cardiovascular Symptoms Reported: No symptoms reported Does patient have uncontrolled Hypertension?: No Cardiovascular Management Strategies: Medication therapy, Routine screening, Diet modification Weight: 189 lb (85.7 kg) (patient report) Cardiovascular Comment: no recent HF symptoms recently - weight stable  Respiratory Respiratory Symptoms Reported: No symptoms reported Respiratory Management Strategies: Routine screening  Endocrine Endocrine Symptoms Reported: No symptoms reported Is patient diabetic?: No    Gastrointestinal Gastrointestinal Symptoms Reported: No symptoms reported Gastrointestinal Management Strategies: Medication therapy Nutrition Risk Screen (CP): No indicators present  Genitourinary      Integumentary Integumentary Symptoms Reported: No symptoms reported Skin Management Strategies: Routine screening  Musculoskeletal Musculoskelatal Symptoms Reviewed: Back pain, Difficulty walking, Joint pain, Limited mobility, Muscle pain, Unsteady gait, Weakness Additional Musculoskeletal Details: worsening fatigue Musculoskeletal Management Strategies: Activity, Adequate rest, Diet modification, Exercise, Fluid modification, Medication therapy, Medical device, Routine screening, Weight management Musculoskeletal Comment: Pain Management 10/31/23 - now has HHA and pain improved due to not cleaning like before, assists with shower Falls in the past year?: Yes Number of falls  in  past year: 2 or more Was there an injury with Fall?: No Fall Risk Category Calculator: 2 Patient Fall Risk Level: Moderate Fall Risk Patient at Risk for Falls Due to: History of fall(s), Impaired balance/gait, Impaired mobility Fall risk Follow up: Falls evaluation completed, Falls prevention discussed  Psychosocial Psychosocial Symptoms Reported: No symptoms reported Additional Psychological Details: counselor thru insurance q 3 mos with option to increase prn Behavioral Management Strategies: Activity, Counseling, Medication therapy Major Change/Loss/Stressor/Fears (CP): Denies Techniques to Cope with Loss/Stress/Change: Counseling, Medication Quality of Family Relationships: helpful, involved Do you feel physically threatened by others?: No      10/10/2023   10:30 AM  Depression screen PHQ 2/9  Decreased Interest 0  Down, Depressed, Hopeless 0  PHQ - 2 Score 0    Vitals:   10/10/23 1019  BP: 123/73    Medications Reviewed Today     Reviewed by Devra Lands, RN (Registered Nurse) on 10/10/23 at 1015  Med List Status: <None>   Medication Order Taking? Sig Documenting Provider Last Dose Status Informant  ALPRAZolam  (XANAX ) 0.5 MG tablet [id number] Yes Take 1 tablet (0.5 mg total) by mouth daily. Okey Barnie SAUNDERS, MD  Active   amoxicillin  (AMOXIL ) 500 MG tablet [id number] Yes Take 4 tablets 1 hour prior to dental work Pietro Redell RAMAN, MD  Active   antiseptic oral rinse KEENAN) LIQD [id number] Yes 15 mLs by Mouth Rinse route 2 (two) times daily as needed for dry mouth. [provider]  Active Self  apixaban  (ELIQUIS ) 5 MG TABS tablet [id number] Yes TAKE 1 TABLET BY MOUTH TWICE  DAILY Crenshaw, Redell RAMAN, MD  Active   cholecalciferol  (VITAMIN D ) 1000 UNITS tablet [id number] Yes Take 1,000 Units by mouth daily. [provider]  Active Self  cyanocobalamin  (VITAMIN B12) 1000 MCG tablet [id number] Yes Take 1 tablet (1,000 mcg total) by mouth once a week. Rilla Baller, MD  Active Self           Med Note (CARD, AMY LITTIE   [place] Nov 29, 2022  9:38 PM) Emily on Friday.  diltiazem  (CARDIZEM  CD) 360 MG 24 hr capsule [id number] Yes TAKE 1 CAPSULE BY MOUTH DAILY Crenshaw, Redell RAMAN, MD  Active   docusate sodium  (COLACE) 100 MG capsule [id number] Yes Take 100 mg by mouth daily. [provider]  Active Self  dofetilide  (TIKOSYN ) 250 MCG capsule [id number] Yes Take 1 capsule (250 mcg total) by mouth 2 (two) times daily. Fenton, Clint R, PA  Active   Evolocumab  (REPATHA  SURECLICK) 140 MG/ML SOAJ [id number] Yes Inject 140 mg into the skin every 14 (fourteen) days. Pietro Redell RAMAN, MD  Active   ezetimibe  (ZETIA ) 10 MG tablet [id number] Yes Take 1 tablet (10 mg total) by mouth at bedtime. Rilla Baller, MD  Active   furosemide  (LASIX ) 20 MG tablet [id number] Yes Take 2 tablets (40 mg total) by mouth daily. Pietro Redell RAMAN, MD  Active   gabapentin  (NEURONTIN ) 300 MG capsule [id number] Yes TAKE 2 CAPSULES BY MOUTH IN THE MORNING 1 CAPSULE BY  MOUTH IN THE AFTERNOON AND  2 CAPSULES BY MOUTH AT  BEDTIME Rilla Baller, MD  Active   HYDROcodone -acetaminophen  (NORCO/VICODIN) 5-325 MG tablet [id number]  Take 1 tablet by mouth every 6 (six) hours as needed for moderate pain (pain score 4-6).  Patient not taking: Reported on 10/10/2023   Zackowski, Scott, MD  Consider Medication Status and Discontinue   Lifitegrast  (XIIDRA ) 5 % SOLN [id number] Yes Place 1 drop into  both eyes 2 (two) times daily.   Active   loratadine  (CLARITIN ) 10 MG tablet [id number] Yes Take 1 tablet (10 mg total) by mouth daily. Rilla Baller, MD  Active Self  losartan  (COZAAR ) 100 MG tablet [id number] Yes Take 0.5 tablets (50 mg total) by mouth daily. Rilla Baller, MD  Active   magnesium  oxide (MAG-OX) 400 (240 Mg) MG tablet [id number] Yes Take 1 tablet (400 mg total) by mouth daily. Rilla Baller, MD  Active   metoprolol  tartrate (LOPRESSOR ) 50 MG tablet [id number] Yes TAKE 1 AND 1/2 TABLETS BY  MOUTH  TWICE DAILY Pietro Redell RAMAN, MD  Active   pantoprazole  (PROTONIX ) 40 MG tablet [id number] Yes Take 1 tablet (40 mg total) by mouth daily. Rilla Baller, MD  Active   potassium chloride  (KLOR-CON  M) 10 MEQ tablet [id number] Yes TAKE 1 TABLET BY MOUTH TWICE  DAILY Rilla Baller, MD  Active   thiamine  (VITAMIN B1) 100 MG tablet [id number] Yes Take 1 tablet (100 mg total) by mouth once a week. Rilla Baller, MD  Active Self           Med Note (CARD, AMY LITTIE   [place] Nov 29, 2022  9:41 PM) Emily on Mondays.  traMADol  (ULTRAM ) 50 MG tablet [id number]  TAKE 1 TABLET BY MOUTH DAILY AS  NEEDED  Patient not taking: Reported on 10/10/2023   Cheryl Waddell HERO, PA-C  Consider Medication Status and Discontinue   zolpidem  (AMBIEN ) 10 MG tablet [id number] Yes Take 1 tablet (10 mg total) by mouth at bedtime as needed for sleep. Okey Barnie SAUNDERS, MD  Active             Recommendation:   PCP Follow-up Specialty provider follow-up Pain Management Continue Current Plan of Care  Follow Up Plan:   Telephone follow-up in 1 month  Nestora Duos, MSN, RN [facility]  [facility], [facility] RN Care Manager Direct Dial: [phone] Fax: [phone]

## 2023-10-13 ENCOUNTER — Other Ambulatory Visit: Payer: Self-pay | Admitting: Cardiology

## 2023-10-16 ENCOUNTER — Encounter: Payer: Self-pay | Admitting: Family Medicine

## 2023-10-16 ENCOUNTER — Telehealth: Payer: Self-pay | Admitting: Rheumatology

## 2023-10-16 NOTE — Telephone Encounter (Signed)
 Patient left a voicemail stating she was referred to [facility] and she is not happy with the referral.  Patient states they don't prescribe pain medication.  They do some type of interventional therapy and I am not interested in that.   Patient is requesting pain medication be sent to [facility].

## 2023-10-16 NOTE — Telephone Encounter (Signed)
 I called patient, referral changed to Pain Management in [facility], patient advised to call PCP for pain medication.

## 2023-10-17 NOTE — Telephone Encounter (Signed)
 Please schedule OV to review pain management options.

## 2023-10-20 DIAGNOSIS — N1832 Chronic kidney disease, stage 3b: Secondary | ICD-10-CM | POA: Diagnosis not present

## 2023-10-20 NOTE — Telephone Encounter (Signed)
 Lvm to schedule appt to discuss pain mgmt options

## 2023-10-22 ENCOUNTER — Encounter: Payer: Self-pay | Admitting: Family Medicine

## 2023-10-22 ENCOUNTER — Other Ambulatory Visit (HOSPITAL_COMMUNITY): Payer: Self-pay

## 2023-10-22 ENCOUNTER — Ambulatory Visit (INDEPENDENT_AMBULATORY_CARE_PROVIDER_SITE_OTHER): Admitting: Family Medicine

## 2023-10-22 VITALS — BP 152/78 | HR 65 | Temp 99.0°F | Ht 61.0 in | Wt 193.2 lb

## 2023-10-22 DIAGNOSIS — N1832 Chronic kidney disease, stage 3b: Secondary | ICD-10-CM

## 2023-10-22 DIAGNOSIS — M797 Fibromyalgia: Secondary | ICD-10-CM | POA: Diagnosis not present

## 2023-10-22 DIAGNOSIS — F411 Generalized anxiety disorder: Secondary | ICD-10-CM

## 2023-10-22 DIAGNOSIS — I484 Atypical atrial flutter: Secondary | ICD-10-CM

## 2023-10-22 DIAGNOSIS — M3501 Sicca syndrome with keratoconjunctivitis: Secondary | ICD-10-CM

## 2023-10-22 DIAGNOSIS — G8929 Other chronic pain: Secondary | ICD-10-CM | POA: Insufficient documentation

## 2023-10-22 MED ORDER — ACETAMINOPHEN 500 MG PO TABS: 500.0000 mg | ORAL_TABLET | Freq: Every day | ORAL | Status: AC

## 2023-10-22 MED ORDER — HYDROCODONE-ACETAMINOPHEN 5-325 MG PO TABS
1.0000 | ORAL_TABLET | Freq: Four times a day (QID) | ORAL | 0 refills | Status: DC | PRN
  Filled 2023-10-22: qty 20, 5d supply, fill #0

## 2023-10-22 NOTE — Progress Notes (Unsigned)
 Ph: [phone] Fax: [phone]   Patient ID: Alexandria Leach, female    DOB: 16-May-1950, 73 y.o.   MRN: [id number]  This visit was conducted in person.  BP (!) 152/78   Pulse 65   Temp 99 F (37.2 C) (Oral)   Ht 5' 1 (1.549 m)   Wt 193 lb 4 oz (87.7 kg)   SpO2 99%   BMI 36.51 kg/m   BP elevated on recheck - pt attributes to increased pain today due to weather  CC: discuss pain management I'm hurting Leach over  Subjective:   HPI: Alexandria Leach is a 73 y.o. female presenting on 10/22/2023 for Pain Management (Pt states pain level is an 8/)   Chronic pain in h/o fibromyalgia, sjogren's with keratoconjunctivitis sicca, cervical and lumbar DDD and polyarthralgia from osteoarthritis (hands, R knee) and chest wall pain s/p ER evaluation 08/2023. Traditionally managed by rheumatology with tramadol  50mg  until tramadol  started causing GI upset/ nausea. Rheum has placed referral to pain management in [facility] - still has not been contacted to schedule appointment.   She is here to discuss pain management.  She was taking tramadol  PRN very intermittently - sometimes a few per month.  She also doesn't take tylenol  too regularly due to concerns over effect on liver.  She received hydrocodone  5/325mg  #12 pills 09/07/2023 and notes she tolerated this well without GI upset or palpitations.   GAD - she takes xanax  daily in the morning through psychiatry.   Fibromyalgia - has worked with PT/OT, continues tylenol  and gabapentin  600/300/600mg  daily.  Cymbalta stopped for tachycardia side effect and drug interactions with Tikosyn .  Trazodone  caused chest pain, facial pain, racing heart.  Paxil  caused GI upset.  Avoiding NSAIDs in h/o CKD.  She is on Tikosyn  and eliquis , as well as metoprolol  75mg  bid for atrial flutter. Takes xanax  daily through Dr Okey psychiatrist.  Lestine could interact with milnacipran (Savella) as well as flexeril .  Lab Results  Component Value  Date   NA 143 09/07/2023   CL 111 09/07/2023   K 3.6 09/07/2023   CO2 19 (L) 09/07/2023   BUN 12 09/07/2023   CREATININE 1.22 (H) 09/07/2023   GFRNONAA 47 (L) 09/07/2023   CALCIUM 8.6 (L) 09/07/2023   PHOS 3.1 02/03/2023   ALBUMIN 3.6 09/07/2023   GLUCOSE 83 09/07/2023    Lab Results  Component Value Date   ALT 12 09/07/2023   AST 22 09/07/2023   ALKPHOS 61 09/07/2023   BILITOT 0.3 09/07/2023       Relevant past medical, surgical, family and social history reviewed and updated as indicated. Interim medical history since our last visit reviewed. Allergies and medications reviewed and updated. Outpatient Medications Prior to Visit  Medication Sig Dispense Refill   ALPRAZolam  (XANAX ) 0.5 MG tablet Take 1 tablet (0.5 mg total) by mouth daily. 30 tablet 2   amoxicillin  (AMOXIL ) 500 MG tablet Take 4 tablets 1 hour prior to dental work 4 tablet 6   antiseptic oral rinse (BIOTENE) LIQD 15 mLs by Mouth Rinse route 2 (two) times daily as needed for dry mouth.     apixaban  (ELIQUIS ) 5 MG TABS tablet TAKE 1 TABLET BY MOUTH TWICE  DAILY 200 tablet 1   cholecalciferol  (VITAMIN D ) 1000 UNITS tablet Take 1,000 Units by mouth daily.     cyanocobalamin  (VITAMIN B12) 1000 MCG tablet Take 1 tablet (1,000 mcg total) by mouth once a week.     diltiazem  (CARDIZEM  CD)  360 MG 24 hr capsule TAKE 1 CAPSULE BY MOUTH DAILY 90 capsule 2   docusate sodium  (COLACE) 100 MG capsule Take 100 mg by mouth daily.     dofetilide  (TIKOSYN ) 250 MCG capsule Take 1 capsule (250 mcg total) by mouth 2 (two) times daily. 180 capsule 1   Evolocumab  (REPATHA  SURECLICK) 140 MG/ML SOAJ Inject 140 mg into the skin every 14 (fourteen) days. 6 mL 3   ezetimibe  (ZETIA ) 10 MG tablet Take 1 tablet (10 mg total) by mouth at bedtime. 100 tablet 3   furosemide  (LASIX ) 20 MG tablet Take 2 tablets (40 mg total) by mouth daily. 180 tablet 3   gabapentin  (NEURONTIN ) 300 MG capsule TAKE 2 CAPSULES BY MOUTH IN THE MORNING 1 CAPSULE BY  MOUTH  IN THE AFTERNOON AND  2 CAPSULES BY MOUTH AT  BEDTIME 500 capsule 3   Lifitegrast  (XIIDRA ) 5 % SOLN Place 1 drop into both eyes 2 (two) times daily. 180 each 4   loratadine  (CLARITIN ) 10 MG tablet Take 1 tablet (10 mg total) by mouth daily. 90 tablet 3   losartan  (COZAAR ) 100 MG tablet Take 0.5 tablets (50 mg total) by mouth daily. 50 tablet 3   magnesium  oxide (MAG-OX) 400 (240 Mg) MG tablet Take 1 tablet (400 mg total) by mouth daily.     metoprolol  tartrate (LOPRESSOR ) 50 MG tablet TAKE 1 AND 1/2 TABLETS BY MOUTH  TWICE DAILY 300 tablet 3   pantoprazole  (PROTONIX ) 40 MG tablet Take 1 tablet (40 mg total) by mouth daily. 100 tablet 3   potassium chloride  (KLOR-CON  M) 10 MEQ tablet TAKE 1 TABLET BY MOUTH TWICE  DAILY 200 tablet 2   thiamine  (VITAMIN B1) 100 MG tablet Take 1 tablet (100 mg total) by mouth once a week.     zolpidem  (AMBIEN ) 10 MG tablet Take 1 tablet (10 mg total) by mouth at bedtime as needed for sleep. 30 tablet 2   HYDROcodone -acetaminophen  (NORCO/VICODIN) 5-325 MG tablet Take 1 tablet by mouth every 6 (six) hours as needed for moderate pain (pain score 4-6). 12 tablet 0   traMADol  (ULTRAM ) 50 MG tablet TAKE 1 TABLET BY MOUTH DAILY AS  NEEDED 30 tablet 0   No facility-administered medications prior to visit.     Per HPI unless specifically indicated in ROS section below Review of Systems  Objective:  BP (!) 152/78   Pulse 65   Temp 99 F (37.2 C) (Oral)   Ht 5' 1 (1.549 m)   Wt 193 lb 4 oz (87.7 kg)   SpO2 99%   BMI 36.51 kg/m   Wt Readings from Last 3 Encounters:  10/22/23 193 lb 4 oz (87.7 kg)  10/10/23 189 lb (85.7 kg)  09/11/23 189 lb 9.6 oz (86 kg)      Physical Exam Vitals and nursing note reviewed.  Constitutional:      Appearance: Normal appearance. She is not ill-appearing.  HENT:     Head: Normocephalic and atraumatic.     Mouth/Throat:     Comments: Wearing N95 mask Eyes:     Extraocular Movements: Extraocular movements intact.     Pupils:  Pupils are equal, round, and reactive to light.  Cardiovascular:     Rate and Rhythm: Normal rate and regular rhythm.     Pulses: Normal pulses.     Heart sounds: Murmur (3/6 systolic USB) heard.  Pulmonary:     Effort: Pulmonary effort is normal. No respiratory distress.     Breath sounds:  Normal breath sounds. No wheezing, rhonchi or rales.  Musculoskeletal:     Right lower leg: No edema.     Left lower leg: No edema.  Skin:    General: Skin is warm and dry.     Findings: No rash.  Neurological:     Mental Status: She is alert.  Psychiatric:        Mood and Affect: Mood normal.        Behavior: Behavior normal.       Results for orders placed or performed in visit on 09/11/23  Protein / creatinine ratio, urine   Collection Time: 09/11/23  8:52 AM  Result Value Ref Range   Creatinine, Urine 291 (H) 20 - 275 mg/dL   Protein/Creat Ratio 52 24 - 184 mg/g creat   Protein/Creatinine Ratio 0.052 0.024 - 0.184 mg/mg creat   Total Protein, Urine 15 5 - 24 mg/dL  ANA   Collection Time: 09/11/23  8:52 AM  Result Value Ref Range   Anti Nuclear Antibody (ANA) POSITIVE (A) NEGATIVE  Anti-DNA antibody, double-stranded   Collection Time: 09/11/23  8:52 AM  Result Value Ref Range   ds DNA Ab <1 IU/mL  Sedimentation rate   Collection Time: 09/11/23  8:52 AM  Result Value Ref Range   Sed Rate 28 0 - 30 mm/h  C3 and C4   Collection Time: 09/11/23  8:52 AM  Result Value Ref Range   C3 Complement 165 83 - 193 mg/dL   C4 Complement 51 15 - 57 mg/dL  Sjogrens syndrome-A extractable nuclear antibody   Collection Time: 09/11/23  8:52 AM  Result Value Ref Range   SSA (Ro) (ENA) Antibody, IgG >8.0 POS (A) <1.0 NEG AI  Serum protein electrophoresis with reflex   Collection Time: 09/11/23  8:52 AM  Result Value Ref Range   Total Protein 7.9 6.1 - 8.1 g/dL   Albumin ELP 4.5 3.8 - 4.8 g/dL   Alpha 1 0.3 0.2 - 0.3 g/dL   Alpha 2 0.8 0.5 - 0.9 g/dL   Beta Globulin 0.5 0.4 - 0.6 g/dL   Beta  2 0.5 0.2 - 0.5 g/dL   Gamma Globulin 1.3 0.8 - 1.7 g/dL   SPE Interp.    Rheumatoid factor   Collection Time: 09/11/23  8:52 AM  Result Value Ref Range   Rheumatoid fact SerPl-aCnc <10 <14 IU/mL  Anti-nuclear ab-titer (ANA titer)   Collection Time: 09/11/23  8:52 AM  Result Value Ref Range   ANA Titer 1 1:80 (H) titer   ANA Pattern 1 Nuclear, Speckled (A)    ANA TITER 1:320 (H) titer   ANA PATTERN Nuclear, Homogeneous (A)    *Note: Due to a large number of results and/or encounters for the requested time period, some results have not been displayed. A complete set of results can be found in Results Review.    Assessment & Plan:   Problem List Items Addressed This Visit     GAD (generalized anxiety disorder)   Sjogren's syndrome (HCC) - Primary   Fibromyalgia   Relevant Medications   HYDROcodone -acetaminophen  (NORCO/VICODIN) 5-325 MG tablet   acetaminophen  (TYLENOL ) 500 MG tablet   Atrial flutter (HCC)     Meds ordered this encounter  Medications   HYDROcodone -acetaminophen  (NORCO/VICODIN) 5-325 MG tablet    Sig: Take 1 tablet by mouth every 6 (six) hours as needed for moderate pain (pain score 4-6).    Dispense:  20 tablet    Refill:  0  acetaminophen  (TYLENOL ) 500 MG tablet    Sig: Take 1 tablet (500 mg total) by mouth daily. With 2nd dose as needed for pain    No orders of the defined types were placed in this encounter.   Patient Instructions  Drug interactions with Tikosyn  limit our options.  Continue gabapentin  as up to now. Start extra strength tylenol  500mg  daily in the mornings.  May use tylenol  500mg  as needed for pain during rest of day.  May use hydrocodone  / apap 5/325mg  as needed for breakthrough pain #20 refilled to pharmacy today. Let me know what pain doctor thinks.   Follow up plan: No follow-ups on file.  Anton Blas, MD

## 2023-10-22 NOTE — Patient Instructions (Addendum)
 Drug interactions with Tikosyn  limit our options.  Continue gabapentin  as up to now. Start extra strength tylenol  500mg  daily in the mornings.  May use tylenol  500mg  as needed for pain during rest of day.  May use hydrocodone  / apap 5/325mg  as needed for breakthrough pain #20 refilled to pharmacy today. Let me know what pain doctor thinks.

## 2023-10-23 ENCOUNTER — Encounter: Payer: Self-pay | Admitting: Family Medicine

## 2023-10-23 NOTE — Assessment & Plan Note (Signed)
 Sees nephrologist Dr Macel Avoid NSAIDs due to this.

## 2023-10-23 NOTE — Assessment & Plan Note (Signed)
 Followed by EP, on Eliquis , Tikosyn , diltiazem , metoprolol  tartrate

## 2023-10-23 NOTE — Assessment & Plan Note (Signed)
 Obesity complicated by comorbidities of CKD HTN

## 2023-10-23 NOTE — Assessment & Plan Note (Signed)
 Sjogren's with keratoconjunctivitis sicca.  Regularly sees rheum Q66mo.

## 2023-10-23 NOTE — Assessment & Plan Note (Addendum)
 Regularly sees psychiatrist - on daily xanax  in the mornings. Not on antidepressant due to above (Tikosyn  interactions).

## 2023-10-23 NOTE — Assessment & Plan Note (Addendum)
 [place] CSRS reviewed  Chronic pain stems from osteoarthritis, fibromyalgia, also with h/o Sjogren's with KC sicca.  Medication options limited by CKD, drug interactions (Tikosyn ).  Rec schedule tylenol  500mg  BID, continue gabapentin  600/300/600mg  daily, will add hydrocodone  apap 5/325mg  PRN breakthrough pain.  Rx today hydrocodone /apap 5/325mg  1/2-1 tab PRN #20 - while we await [facility] pain management evaluation pending appt. Did not fill out controlled substance agreement today.

## 2023-10-23 NOTE — Assessment & Plan Note (Signed)
 This contributes to chronic pain.  Further treatment options limited due to drug interactions (Cymbalta, Savella, cyclobenzaprine ).

## 2023-10-26 ENCOUNTER — Other Ambulatory Visit (HOSPITAL_COMMUNITY): Payer: Self-pay | Admitting: Physician Assistant

## 2023-10-27 DIAGNOSIS — Z87442 Personal history of urinary calculi: Secondary | ICD-10-CM | POA: Diagnosis not present

## 2023-10-27 DIAGNOSIS — M3500 Sicca syndrome, unspecified: Secondary | ICD-10-CM | POA: Diagnosis not present

## 2023-10-27 DIAGNOSIS — I129 Hypertensive chronic kidney disease with stage 1 through stage 4 chronic kidney disease, or unspecified chronic kidney disease: Secondary | ICD-10-CM | POA: Diagnosis not present

## 2023-10-27 DIAGNOSIS — N1832 Chronic kidney disease, stage 3b: Secondary | ICD-10-CM | POA: Diagnosis not present

## 2023-10-27 DIAGNOSIS — I4891 Unspecified atrial fibrillation: Secondary | ICD-10-CM | POA: Diagnosis not present

## 2023-10-28 ENCOUNTER — Encounter: Payer: Self-pay | Admitting: Internal Medicine

## 2023-10-29 ENCOUNTER — Ambulatory Visit: Admitting: Pain Medicine

## 2023-10-30 NOTE — Telephone Encounter (Signed)
 Attempted to contact patient.  Unable to leave voicemail.  My Chart message sent.

## 2023-11-05 ENCOUNTER — Ambulatory Visit
Admission: RE | Admit: 2023-11-05 | Discharge: 2023-11-05 | Disposition: A | Discharge status: Home / Self Care | Source: Ambulatory Visit | Attending: Family Medicine | Admitting: Family Medicine

## 2023-11-05 DIAGNOSIS — Z1231 Encounter for screening mammogram for malignant neoplasm of breast: Secondary | ICD-10-CM

## 2023-11-06 ENCOUNTER — Other Ambulatory Visit

## 2023-11-06 ENCOUNTER — Other Ambulatory Visit: Payer: Self-pay

## 2023-11-06 NOTE — Patient Outreach (Signed)
 Complex Care Management   Visit Note  11/06/2023  Name:  Alexandria Leach MRN: [id number] DOB: 1950-12-31  Situation: Referral received for Complex Care Management related to Heart Failure and Fibromyalgia I obtained verbal consent from Patient.  Visit completed with Patient  on the phone  Background:   Past Medical History:  Diagnosis Date   Ankle fracture, left 02/19/2015   from a fall   Anxiety    Cataract 2014   corrected with surgery   CHF (congestive heart failure) (HCC)    CKD (chronic kidney disease) stage 3, GFR 30-59 ml/min [facility])    saw nephrologist Dr Juline   Community acquired pneumonia 11/29/2022   Multifocal s/p hospitalization 11/2022     COVID-19 09/21/2021   DDD (degenerative disc disease), cervical    Depression    Fibromyalgia    Glaucoma    s/p surgery, sees ophtho Q6 mo   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 Thought benign.    Osteoarthritis    shoulders and knees, not RA per Dr Dolphus, positive ANA, positive Ro   Osteopenia 09/18/2015   DEXA T -1.1 hip, -0.2 spine 08/2015    Personal history of urinary calculi latest 2014   Pneumonia 12/02/2011   PONV (postoperative nausea and vomiting)    Refusal of blood transfusions as patient is Jehovah's Witness    Rheumatic heart disease 1980   s/p mitral valve repair 1980   Sjogren's syndrome (HCC)    Trimalleolar fracture of left ankle 02/23/2015    Assessment: Patient Reported Symptoms:  Cognitive Cognitive Status: No symptoms reported   Health Maintenance Behaviors: Annual physical exam, Exercise, Healthy diet, Sleep adequate Healing Pattern:  Slow Health Facilitated by: Pain control, Healthy diet  Neurological   Neurological Management Strategies: Routine screening, Medication therapy Neurological Comment: neuropathy - gabapentin   HEENT HEENT Symptoms Reported: No symptoms reported HEENT Management Strategies: Routine screening    Cardiovascular Cardiovascular Symptoms Reported: Palpitations Does patient have uncontrolled Hypertension?: No Cardiovascular Management Strategies: Routine screening, Medication therapy Weight: 191 lb (86.6 kg) Cardiovascular Comment: rare paplipations - no SON or dizziness with it, Cardiology 11/19/23  BP and weight checks 2-3 x week, CHF Action Plan - confirmed has and understands  Respiratory Respiratory Symptoms Reported: No symptoms reported Respiratory Management Strategies: Routine screening  Endocrine Endocrine Symptoms Reported: No symptoms reported Is patient diabetic?: No    Gastrointestinal Gastrointestinal Symptoms Reported: No symptoms reported Additional Gastrointestinal Details: discussed risk of constipatoipnm with Norco - has Colace and Miralax  prn Gastrointestinal Management Strategies: Medication therapy    Genitourinary Genitourinary Symptoms Reported: No symptoms reported    Integumentary Integumentary Symptoms Reported: No symptoms reported Skin Management Strategies: Routine screening  Musculoskeletal Musculoskelatal Symptoms Reviewed: Back pain, Difficulty walking, Joint pain, Limited mobility, Muscle pain Additional Musculoskeletal Details: PT/OT completed - does not feel unsteady at this time, doing PT exercises on own, better balance Musculoskeletal Management Strategies: Activity, Diet modification, Exercise, Medication therapy, Routine screening Musculoskeletal Comment: no longer with HHA, Pain management pending - started Norco - discussed proper use for pain control Falls in the past  year?: Yes Number of falls in past year: 1 or less Was there an injury with Fall?:  No Fall Risk Category Calculator: 1 Patient Fall Risk Level: Low Fall Risk Patient at Risk for Falls Due to: History of fall(s), Impaired balance/gait, Impaired mobility, Medication side effect Fall risk Follow up: Falls evaluation completed, Falls prevention discussed (Anticoagulent  - ED for head bumps)  Psychosocial Psychosocial Symptoms Reported: No symptoms reported Additional Psychological Details: counselor q 3 mos scheduled for Sept - denies need for more frequent visits at this time Behavioral Management Strategies: Activity, Counseling, Medication therapy   Quality of Family Relationships: helpful, involved Do you feel physically threatened by others?: No    11/06/2023    PHQ2-9 Depression Screening   Little interest or pleasure in doing things Not at all  Feeling down, depressed, or hopeless Not at all  PHQ-2 - Total Score 0  Trouble falling or staying asleep, or sleeping too much Not at all  Feeling tired or having little energy Not at all  Poor appetite or overeating  Not at all  Feeling bad about yourself - or that you are a failure or have let yourself or your family down Not at all  Trouble concentrating on things, such as reading the newspaper or watching television Not at all  Moving or speaking so slowly that other people could have noticed.  Or the opposite - being so fidgety or restless that you have been moving around a lot more than usual Not at all  Thoughts that you would be better off dead, or hurting yourself in some way Not at all  PHQ2-9 Total Score 0  If you checked off any problems, how difficult have these problems made it for you to do your work, take care of things at home, or get along with other people Not difficult at all  Depression Interventions/Treatment      Vitals:   11/06/23 1006  BP: 132/71    Medications Reviewed Today     Reviewed by Devra Lands, RN (Registered Nurse) on 11/06/23 at [id number]  Med List Status: <None>   Medication  Order Taking? Sig Documenting Provider Last Dose Status Informant  acetaminophen  (TYLENOL ) 500 MG tablet [id number] Yes Take 1 tablet (500 mg total) by mouth daily. With 2nd dose as needed for pain Rilla Baller, MD  Active   ALPRAZolam  (XANAX ) 0.5 MG tablet [id number] Yes Take 1 tablet (0.5 mg total) by mouth daily. Okey Barnie SAUNDERS, MD  Active   amoxicillin  (AMOXIL ) 500 MG tablet [id number] Yes Take 4 tablets 1 hour prior to dental work Pietro Redell RAMAN, MD  Active   antiseptic oral rinse KEENAN) LIQD [id number] Yes 15 mLs by Mouth Rinse route 2 (two) times daily as needed for dry mouth. [provider]  Active Self  apixaban  (ELIQUIS ) 5 MG TABS tablet [id number] Yes TAKE 1 TABLET BY MOUTH TWICE  DAILY Crenshaw, Redell RAMAN, MD  Active   cholecalciferol  (VITAMIN D ) 1000 UNITS tablet [id number] Yes Take 1,000 Units by mouth daily. [provider]  Active Self  cyanocobalamin  (VITAMIN B12) 1000 MCG tablet [id number] Yes Take 1 tablet (1,000 mcg total) by mouth once a week. Rilla Baller, MD  Active Self           Med Note (CARD, AMY LITTIE   [place] Nov 29, 2022  9:38 PM) Emily on Friday.  diltiazem  (CARDIZEM  CD) 360 MG 24 hr capsule [id number] Yes TAKE 1 CAPSULE BY MOUTH DAILY Crenshaw, Redell RAMAN, MD  Active   docusate sodium  (COLACE) 100 MG capsule [id number] Yes Take 100 mg by mouth daily. [provider]  Active Self  dofetilide  (TIKOSYN ) 250 MCG capsule [id number] Yes TAKE 1 CAPSULE BY MOUTH TWICE  DAILY Crenshaw, Redell RAMAN, MD  Active   Evolocumab  (REPATHA  SURECLICK) 140 MG/ML SOAJ [id number] Yes Inject 140 mg into the skin every 14 (fourteen) days. Pietro Redell RAMAN, MD  Active   ezetimibe  (ZETIA ) 10 MG tablet [id number] Yes Take 1 tablet (10 mg total) by mouth at bedtime. Rilla Baller, MD  Active   furosemide  (LASIX ) 20 MG tablet [id number] Yes Take 2 tablets (40 mg total) by mouth daily. Pietro Redell RAMAN, MD  Active   gabapentin  (NEURONTIN ) 300 MG capsule [id number] Yes TAKE 2  CAPSULES BY MOUTH IN THE MORNING 1 CAPSULE BY  MOUTH IN THE AFTERNOON AND  2 CAPSULES BY MOUTH AT  BEDTIME Rilla Baller, MD  Active   HYDROcodone -acetaminophen  (NORCO/VICODIN) 5-325 MG tablet [id number] Yes Take 1 tablet by mouth every 6 (six) hours as needed for moderate pain (pain score 4-6). Rilla Baller, MD  Active   Lifitegrast  (XIIDRA ) 5 % SOLN [id number] Yes Place 1 drop into both eyes 2 (two) times daily.   Active   loratadine  (CLARITIN ) 10 MG tablet [id number] Yes Take 1 tablet (10 mg total) by mouth daily. Rilla Baller, MD  Active Self  losartan  (COZAAR ) 100 MG tablet [id number] Yes Take 0.5 tablets (50 mg total) by mouth daily. Rilla Baller, MD  Active   magnesium  oxide (MAG-OX) 400 (240 Mg) MG tablet [id number] Yes Take 1 tablet (400 mg total) by mouth daily. Rilla Baller, MD  Active   metoprolol  tartrate (LOPRESSOR ) 50 MG tablet [id number] Yes TAKE 1 AND 1/2 TABLETS BY MOUTH  TWICE DAILY Pietro Redell RAMAN, MD  Active   pantoprazole  (PROTONIX ) 40 MG tablet [id number] Yes Take 1 tablet (40 mg total) by mouth daily. Rilla Baller, MD  Active   potassium chloride  (KLOR-CON  M) 10 MEQ tablet [id number] Yes TAKE 1 TABLET BY MOUTH TWICE  DAILY Rilla Baller, MD  Active   thiamine  (VITAMIN B1) 100 MG tablet [id number] Yes Take 1 tablet (100 mg total) by mouth once a week. Rilla Baller, MD  Active Self           Med Note (CARD, AMY LITTIE   [place] Nov 29, 2022  9:41 PM) Emily on Mondays.  zolpidem  (AMBIEN ) 10 MG tablet [id number] Yes Take 1 tablet (10 mg total) by mouth at bedtime as needed for sleep. Okey Barnie SAUNDERS, MD  Active             Recommendation:   PCP Follow-up Continue Current Plan of Care  Follow Up Plan:   Telephone follow-up in 1 month  Nestora Duos, MSN, RN [facility]  [facility], [facility] RN Care Manager Direct Dial: [phone] Fax: [phone]

## 2023-11-06 NOTE — Patient Instructions (Signed)
 Visit Information  Ms. Loeffelholz was given information about Medicaid Managed Care team care coordination services as a part of their [facility] Community Plan Medicaid benefit.   If you would like to schedule transportation through your [facility], please call the following number at least 2 days in advance of your appointment: [phone]   Rides for urgent appointments can also be made after hours by calling Member Services.  Call the Behavioral Health Crisis Line at [phone], at any time, 24 hours a day, 7 days a week. If you are in danger or need immediate medical attention call 911.   Ms. Spath - following are the goals we discussed in your visit today:   Goals Addressed             This Visit's Progress    VBCI RN Care Plan- Fibromyalgia/ peripheral neuropathy       Problems:  Chronic Disease Management support and education needs related to fibromyalgia/ peripheral neuropathy Lacks caregiver support. HHA to start next week, 3 days week for 2 hours a day. - Patient reports decline in function. Increased pain.   Goal: Over the next 3 months the Patient will attend all scheduled medical appointments: with providers as evidenced by patient report/ chart review.         continue to work with Medical illustrator and/or Social Worker to address care management and care coordination needs related to fibromyalgia/ peripheral neur as evidenced by adherence to care management team scheduled appointments     demonstrate Ongoing adherence to prescribed treatment plan for fibromyalgia/ peripheral neuropathy as evidenced by patient report/ chart review.  take all medications exactly as prescribed and will call provider for medication related questions as evidenced by patient report/ chart review.      Interventions:   Evaluation of current treatment plan related to peripheral neuropathy/ fibromyalgia,  self-management and patient's adherence to plan as  established by provider. Discussed plans with patient for ongoing care management follow up and provided patient with direct contact information for care management team Reviewed medications with patient and discussed patient report/ chart review. Patient verbalized compliance with all medications Reviewed scheduled/upcoming provider appointments including Pain Management 10/31/2023 Assessed social determinant of health barriers No needs Advised to contact RN case manager if assistance is needed with coordinating home health/PT services. PT completed with improvement in balance. Continues to do exercised on her own. Fall precautions discussed. No new falls Fall precaution education article sent to patient.  Patient referred to Pain Management by Rheumatology - awaiting scheduling. Patient will utilize HHA - scheduled MWF, 2 hours each day - states less pain since HHA completing housecleaning and assisting with shower - patient looking for new HHA/company, has list.  Lupus information sent via MyChart as requested - patient states Rheumatology told her likely has lupus based on family history, labs and increased fatigue  Patient Self-Care Activities:  Attend all scheduled provider appointments Call pharmacy for medication refills 3-7 days in advance of running out of medications Call provider office for new concerns or questions  Take medications as prescribed   Contact RN if assistance needed with coordinating home health PT services or Pain management referral Review fall precaution education article and discuss with RN case manager if you have questions  Looking for new HHA company - not satisfied with servce. Patient will call Rheumatology in 1 week if she does not get scheduling call from Pain Management Patient will not wait until pain severe to  take Norco as discussed.   Plan:  Telephone follow up appointment with care management team member scheduled for:  12/04/2023 at 9:45 am           VBCI RN Care Plan- Heart Failure       Problems:  Chronic Disease Management support and education needs related to CHF  Goal: Over the next 3 months the Patient will attend all scheduled medical appointments: with providers as evidenced by patient report/ chart review.         continue to work with Medical illustrator and/or Social Worker to address care management and care coordination needs related to CHF as evidenced by adherence to care management team scheduled appointments     demonstrate Ongoing adherence to prescribed treatment plan for CHF as evidenced by patient report/ chart review.  take all medications exactly as prescribed and will call provider for medication related questions as evidenced by patient report/ chart review.      Interventions:   Heart Failure Interventions: Reviewed Heart Failure Action Plan in depth and provided written copy - Confirmed received, reviewed and patient verbalized understanding Assessed need for readable accurate scales in home - weights 2-3x week and records Advised patient to weigh each morning after emptying bladder Discussed importance of daily weight and advised patient to weigh and record daily - patient measures weight and BP daily and records, weight stable and BP <120/80, eating heart healthy low sodium diet.  Reviewed role of diuretics in prevention of fluid overload and management of heart failure;  Discussed the importance of keeping all appointments with provider Assessed social determinant of health barriers No needs  Patient Self-Care Activities:  Attend all scheduled provider appointments Call pharmacy for medication refills 3-7 days in advance of running out of medications Call provider office for new concerns or questions  Take medications as prescribed   call office if I gain more than 2 pounds in one day or 5 pounds in one week track weight in diary use salt in moderation watch for swelling in feet, ankles and legs  every day weigh myself daily follow rescue plan if symptoms flare-up  Plan:  Telephone follow up appointment with care management team member scheduled for:  12/04/2023 at 9:45 am             Please see education materials related to Pain management provided by MyChart link.  Patient verbalizes understanding of instructions and care plan provided today and agrees to view in MyChart. Active MyChart status and patient understanding of how to access instructions and care plan via MyChart confirmed with patient.     Telephone follow up appointment with Managed Medicaid care management team member scheduled for: 12/04/2023 at 9:45 am  Nestora Duos, MSN, RN [place]  [facility], [facility] RN Care Manager Direct Dial: [phone] Fax: [phone]  Opioid Pain Medicine Management Opioids are powerful medicines that are used to treat moderate to severe pain. When used for short periods of time, they can help you to: Sleep better. Do better in physical or occupational therapy. Feel better in the first few days after an injury. Recover from surgery. Opioids should be taken with the supervision of a trained health care provider. They should be taken for the shortest period of time possible. This is because opioids can be addictive, and the longer you take opioids, the greater your risk of addiction. This addiction can also be called opioid use disorder. What are the risks? Using opioid pain medicines  for longer than 3 days increases your risk of side effects. Side effects include: Constipation. Nausea and vomiting. Breathing difficulties (respiratory depression). Drowsiness. Confusion. Opioid use disorder. Itching. Taking opioid pain medicine for a long period of time can affect your ability to do daily tasks. It also puts you at risk for: Motor vehicle crashes. Depression. Suicide. Heart attack. Overdose, which can be life-threatening. What is a  pain treatment plan? A pain treatment plan is an agreement between you and your health care provider. Pain is unique to each person, and treatments vary depending on your condition. To manage your pain, you and your health care provider need to work together. To help you do this: Discuss the goals of your treatment, including how much pain you might expect to have and how you will manage the pain. Review the risks and benefits of taking opioid medicines. Remember that a good treatment plan uses more than one approach and minimizes the chance of side effects. Be honest about the amount of medicines you take and about any drug or alcohol  use. Get pain medicine prescriptions from only one health care provider. Pain can be managed with many types of alternative treatments. Ask your health care provider to refer you to one or more specialists who can help you manage pain through: Physical or occupational therapy. Counseling (cognitive behavioral therapy). Good nutrition. Biofeedback. Massage. Meditation. Non-opioid medicine. Following a gentle exercise program. How to use opioid pain medicine Taking medicine Take your pain medicine exactly as told by your health care provider. Take it only when you need it. If your pain gets less severe, you may take less than your prescribed dose if your health care provider approves. If you are not having pain, do nottake pain medicine unless your health care provider tells you to take it. If your pain is severe, do nottry to treat it yourself by taking more pills than instructed on your prescription. Contact your health care provider for help. Write down the times when you take your pain medicine. It is easy to become confused while on pain medicine. Writing the time can help you avoid overdose. Take other over-the-counter or prescription medicines only as told by your health care provider. Keeping yourself and others safe  While you are taking opioid pain  medicine: Do not drive, use machinery, or power tools. Do not sign legal documents. Do not drink alcohol . Do not take sleeping pills. Do not supervise children by yourself. Do not do activities that require climbing or being in high places. Do not go to a lake, river, ocean, spa, or swimming pool. Do not share your pain medicine with anyone. Keep pain medicine in a locked cabinet or in a secure area where pets and children cannot reach it. Stopping your use of opioids If you have been taking opioid medicine for more than a few Priti Consoli, you may need to slowly decrease (taper) how much you take until you stop completely. Tapering your use of opioids can decrease your risk of symptoms of withdrawal, such as: Pain and cramping in the abdomen. Nausea. Sweating. Sleepiness. Restlessness. Uncontrollable shaking (tremors). Cravings for the medicine. Do not attempt to taper your use of opioids on your own. Talk with your health care provider about how to do this. Your health care provider may prescribe a step-down schedule based on how much medicine you are taking and how long you have been taking it. Getting rid of leftover pills Do not save any leftover pills. Get rid of leftover  pills safely by: Taking the medicine to a prescription take-back program. This is usually offered by the county or law enforcement. Bringing them to a pharmacy that has a drug disposal container. Flushing them down the toilet. Check the label or package insert of your medicine to see whether this is safe to do. Throwing them out in the trash. Check the label or package insert of your medicine to see whether this is safe to do. If it is safe to throw it out, remove the medicine from the original container, put it into a sealable bag or container, and mix it with used coffee grounds, food scraps, dirt, or cat litter before putting it in the trash. Follow these instructions at home: Activity Do exercises as told by your  health care provider. Avoid activities that make your pain worse. Return to your normal activities as told by your health care provider. Ask your health care provider what activities are safe for you. General instructions You may need to take these actions to prevent or treat constipation: Drink enough fluid to keep your urine pale yellow. Take over-the-counter or prescription medicines. Eat foods that are high in fiber, such as beans, whole grains, and fresh fruits and vegetables. Limit foods that are high in fat and processed sugars, such as fried or sweet foods. Keep all follow-up visits. This is important. Where to find support If you have been taking opioids for a long time, you may benefit from receiving support for quitting from a local support group or counselor. Ask your health care provider for a referral to these resources in your area. Where to find more information Centers for Disease Control and Prevention (CDC): FootballExhibition.com.br [place] Food and Drug Administration (FDA): PumpkinSearch.com.ee Get help right away if: You may have taken too much of an opioid (overdosed). Common symptoms of an overdose: Your breathing is slower or more shallow than normal. You have a very slow heartbeat (pulse). You have slurred speech. You have nausea and vomiting. Your pupils become very small. You have other potential symptoms: You are very confused. You faint or feel like you will faint. You have cold, clammy skin. You have blue lips or fingernails. You have thoughts of harming yourself or harming others. These symptoms may represent a serious problem that is an emergency. Do not wait to see if the symptoms will go away. Get medical help right away. Call your local emergency services (911 in the [place] Do not drive yourself to the hospital.  If you ever feel like you may hurt yourself or others, or have thoughts about taking your own life, get help right away. Go to your nearest emergency department  or: Call your local emergency services (911 in the [place] Call the [facility] (8252694134 in the [place] Call a suicide crisis helpline, such as the National Suicide Prevention Lifeline at 352-876-4615 or 988 in the [place] This is open 24 hours a day in the [place] If you're a Veteran: Call 988 and press 1. This is open 24 hours a day. Text the [employer] at (563)876-5435. Summary Opioid medicines can help you manage moderate to severe pain for a short period of time. A pain treatment plan is an agreement between you and your health care provider. Discuss the goals of your treatment, including how much pain you might expect to have and how you will manage the pain. If you think that you or someone else may have taken too much of an opioid, get medical  help right away. This information is not intended to replace advice given to you by your health care provider. Make sure you discuss any questions you have with your health care provider. Document Revised: 12/09/2022 Document Reviewed: 06/14/2020 Elsevier Patient Education  2024 [employer].  Following is a copy of your plan of care:  There are no care plans that you recently modified to display for this patient.

## 2023-11-07 ENCOUNTER — Other Ambulatory Visit (HOSPITAL_COMMUNITY): Payer: Self-pay

## 2023-11-07 ENCOUNTER — Ambulatory Visit: Payer: Self-pay | Admitting: Family Medicine

## 2023-11-14 ENCOUNTER — Encounter: Payer: Self-pay | Admitting: Cardiology

## 2023-11-14 ENCOUNTER — Other Ambulatory Visit (HOSPITAL_COMMUNITY): Payer: Self-pay

## 2023-11-14 DIAGNOSIS — I5032 Chronic diastolic (congestive) heart failure: Secondary | ICD-10-CM

## 2023-11-14 MED ORDER — FUROSEMIDE 20 MG PO TABS
40.0000 mg | ORAL_TABLET | Freq: Every day | ORAL | 2 refills | Status: AC
  Filled 2023-11-14 – 2024-04-12 (×7): qty 180, 90d supply, fill #0

## 2023-11-19 ENCOUNTER — Other Ambulatory Visit (HOSPITAL_COMMUNITY): Payer: Self-pay

## 2023-11-19 ENCOUNTER — Ambulatory Visit (HOSPITAL_COMMUNITY)
Admission: RE | Admit: 2023-11-19 | Discharge: 2023-11-19 | Disposition: A | Discharge status: Home / Self Care | Source: Ambulatory Visit | Attending: Physician Assistant | Admitting: Physician Assistant

## 2023-11-19 VITALS — BP 138/62 | HR 68 | Ht 61.0 in | Wt 190.6 lb

## 2023-11-19 DIAGNOSIS — Z5181 Encounter for therapeutic drug level monitoring: Secondary | ICD-10-CM

## 2023-11-19 DIAGNOSIS — I4891 Unspecified atrial fibrillation: Secondary | ICD-10-CM | POA: Diagnosis not present

## 2023-11-19 DIAGNOSIS — Z79899 Other long term (current) drug therapy: Secondary | ICD-10-CM | POA: Diagnosis not present

## 2023-11-19 DIAGNOSIS — I484 Atypical atrial flutter: Secondary | ICD-10-CM | POA: Diagnosis not present

## 2023-11-19 DIAGNOSIS — D6869 Other thrombophilia: Secondary | ICD-10-CM | POA: Diagnosis not present

## 2023-11-19 NOTE — Progress Notes (Signed)
 Primary Care Physician: Rilla Baller, MD Referring Physician: Dr. Pietro Primary EP: Dr W[place] Alexandria Leach is a 73 y.o. female with a h/o atrial flutter in April 2022. H/o of rheumatic heart disease with remote repair pf mitral valve in 1980, HTN, CHF, CKD.  She was placed on anticoagulation and had a succession CV but ERAfl. She felt much improved while in SR. Patient is s/p dofetilide  admission 8/8-8/11/22. She converted with medication and did not require DCCV.   Patient returns for follow up for atrial flutter. She remains in SR today and feels well. She denies any interim symptoms of afib. No bleeding issues on anticoagulation.   Today, she  denies symptoms of palpitations, chest pain, shortness of breath, orthopnea, PND, lower extremity edema, dizziness, presyncope, syncope, snoring, daytime somnolence, bleeding, or neurologic sequela. The patient is tolerating medications without difficulties and is otherwise without complaint today.    Past Medical History:  Diagnosis Date   Ankle fracture, left 02/19/2015   from a fall   Anxiety    Cataract 2014   corrected with surgery   CHF (congestive heart failure) (HCC)    CKD (chronic kidney disease) stage 3, GFR 30-59 ml/min [facility])    saw nephrologist Dr Juline   Community acquired pneumonia 11/29/2022   Multifocal s/p hospitalization 11/2022     COVID-19 09/21/2021   DDD (degenerative disc disease), cervical    Depression    Fibromyalgia    Glaucoma    s/p surgery, sees ophtho Q6 mo   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 Thought benign.    Osteoarthritis     shoulders and knees, not RA per Dr Dolphus, positive ANA, positive Ro   Osteopenia 09/18/2015   DEXA T -1.1 hip, -0.2 spine 08/2015    Personal history of urinary calculi latest 2014   Pneumonia 12/02/2011   PONV (postoperative nausea and vomiting)    Refusal of blood transfusions as patient is Jehovah's Witness    Rheumatic heart disease 1980   s/p mitral valve repair 1980   Sjogren's syndrome (HCC)    Trimalleolar fracture of left ankle 02/23/2015    Current Outpatient Medications  Medication Sig Dispense Refill   acetaminophen  (TYLENOL ) 500 MG tablet Take 1 tablet (500 mg total) by mouth daily. With 2nd dose as needed for pain     ALPRAZolam  (XANAX ) 0.5 MG tablet Take 1 tablet (0.5 mg total) by mouth daily. 30 tablet 2   amoxicillin  (AMOXIL ) 500 MG tablet Take 4 tablets 1 hour prior to dental work 4 tablet 6   antiseptic oral rinse (BIOTENE) LIQD 15 mLs by Mouth Rinse route 2 (two) times daily as needed for dry mouth.     apixaban  (ELIQUIS ) 5 MG TABS tablet TAKE 1 TABLET BY MOUTH TWICE  DAILY 200 tablet 1   cholecalciferol  (VITAMIN D ) 1000 UNITS tablet Take 1,000 Units by mouth daily.     cyanocobalamin  (VITAMIN B12) 1000 MCG tablet Take 1 tablet (1,000 mcg total) by mouth once a week.     diltiazem  (CARDIZEM  CD) 360 MG 24 hr  capsule TAKE 1 CAPSULE BY MOUTH DAILY 90 capsule 2   docusate sodium  (COLACE) 100 MG capsule Take 100 mg by mouth daily.     dofetilide  (TIKOSYN ) 250 MCG capsule TAKE 1 CAPSULE BY MOUTH TWICE  DAILY 200 capsule 3   Evolocumab  (REPATHA  SURECLICK) 140 MG/ML SOAJ Inject 140 mg into the skin every 14 (fourteen) days. 6 mL 3   ezetimibe  (ZETIA ) 10 MG tablet Take 1 tablet (10 mg total) by mouth at bedtime. 100 tablet 3   furosemide  (LASIX ) 20 MG tablet Take 2 tablets (40 mg total) by mouth daily. 180 tablet 2   gabapentin  (NEURONTIN ) 300 MG capsule TAKE 2 CAPSULES BY MOUTH IN THE MORNING 1 CAPSULE BY  MOUTH IN THE AFTERNOON AND  2 CAPSULES BY MOUTH AT  BEDTIME 500  capsule 3   HYDROcodone -acetaminophen  (NORCO/VICODIN) 5-325 MG tablet Take 1 tablet by mouth every 6 (six) hours as needed for moderate pain (pain score 4-6). 20 tablet 0   Lifitegrast  (XIIDRA ) 5 % SOLN Place 1 drop into both eyes 2 (two) times daily. 180 each 4   loratadine  (CLARITIN ) 10 MG tablet Take 1 tablet (10 mg total) by mouth daily. 90 tablet 3   losartan  (COZAAR ) 100 MG tablet Take 0.5 tablets (50 mg total) by mouth daily. 50 tablet 3   magnesium  oxide (MAG-OX) 400 (240 Mg) MG tablet Take 1 tablet (400 mg total) by mouth daily.     metoprolol  tartrate (LOPRESSOR ) 50 MG tablet TAKE 1 AND 1/2 TABLETS BY MOUTH  TWICE DAILY 300 tablet 3   pantoprazole  (PROTONIX ) 40 MG tablet Take 1 tablet (40 mg total) by mouth daily. 100 tablet 3   potassium chloride  (KLOR-CON  M) 10 MEQ tablet TAKE 1 TABLET BY MOUTH TWICE  DAILY 200 tablet 2   thiamine  (VITAMIN B1) 100 MG tablet Take 1 tablet (100 mg total) by mouth once a week.     zolpidem  (AMBIEN ) 10 MG tablet Take 1 tablet (10 mg total) by mouth at bedtime as needed for sleep. 30 tablet 2   No current facility-administered medications for this encounter.    ROS- All systems are reviewed and negative except as per the HPI above  Physical Exam: Vitals:   11/19/23 0857  BP: 138/62  Pulse: 68  Weight: 86.5 kg  Height: 5' 1 (1.549 m)    Wt Readings from Last 3 Encounters:  11/19/23 86.5 kg  11/06/23 86.6 kg  10/22/23 87.7 kg    GEN: Well nourished, well developed in no acute distress CARDIAC: Regular rate and rhythm, no murmurs, rubs, gallops RESPIRATORY:  Clear to auscultation without rales, wheezing or rhonchi  ABDOMEN: Soft, non-tender, non-distended EXTREMITIES:  No edema; No deformity    EKG today demonstrates  SR, sinus exit block Vent. rate 68 BPM PR interval 194 ms QRS duration 82 ms QT/QTcB 280/297 ms    Echo 08/13/23  1. Left ventricular ejection fraction, by estimation, is 60 to 65%. The  left ventricle has normal  function. The left ventricle has no regional  wall motion abnormalities. Left ventricular diastolic parameters are  consistent with Grade II diastolic  dysfunction (pseudonormalization). The average left ventricular global  longitudinal strain is -20.0 %. The global longitudinal strain is normal.   2. Right ventricular systolic function is normal. The right ventricular  size is normal. There is mildly elevated pulmonary artery systolic  pressure. The estimated right ventricular systolic pressure is 41.9 mmHg.   3. Left atrial size was mild to moderately dilated.  4. Status post mitral valve repair in setting of rheumatic MV disease.  The anterior leaflet is calcified and mildly thickened, both leaflets show  decreased excursion. Mean gradient 10 mmHg. At least moderate mitral  stenosis with MVA 1.63 cm^2 by PHT and   0.74 cm^2 by VTI. Mild mitral valve regurgitation.   5. The aortic valve is tricuspid. There is moderate calcification of the  aortic valve. Aortic valve regurgitation is mild. Mild aortic valve  stenosis. Aortic valve mean gradient measures 16.0 mmHg.   6. The inferior vena cava is normal in size with greater than 50%  respiratory variability, suggesting right atrial pressure of 3 mmHg.    CHA2DS2-VASc Score = 4  The patient's score is based upon: CHF History: 1 HTN History: 1 Diabetes History: 0 Stroke History: 0 Vascular Disease History: 0 Age Score: 1 Gender Score: 1       ASSESSMENT AND PLAN: Atypical atrial flutter The patient's CHA2DS2-VASc score is 4, indicating a 4.8% annual risk of stroke.   S/p dofetilide  admission 10/2020 Patient appears to be maintaining SR Continue dofetilide  250 mcg BID Continue Eliquis  5 mg BID Continue diltiazem  360 mg daily Continue Lopressor  75 mg BID  Secondary Hypercoagulable State (ICD10:  D68.69) The patient is at significant risk for stroke/thromboembolism based upon her CHA2DS2-VASc Score of 4.  Continue Apixaban   (Eliquis ). No bleeding issues.   High Risk Medication Monitoring (ICD 10: J342684) Patient requires ongoing monitoring for anti-arrhythmic medication which has the potential to cause life threatening arrhythmias. QT interval on ECG acceptable for dofetilide  monitoring. Check bmet/mag today.     Chronic HFpEF EF 60-65% GDMT per primary cardiology team Fluid status appears stable today  HTN Stable on current regimen   Follow up with EP APP in 6 months. AF clinic in one year.    Daril Kicks PA-C [facility] [address] [place], [place] [address] [phone]

## 2023-11-20 ENCOUNTER — Ambulatory Visit (HOSPITAL_COMMUNITY): Payer: Self-pay | Admitting: Physician Assistant

## 2023-11-20 ENCOUNTER — Encounter (HOSPITAL_COMMUNITY): Payer: Self-pay

## 2023-11-20 ENCOUNTER — Telehealth (HOSPITAL_COMMUNITY): Admitting: Psychiatry

## 2023-11-20 LAB — BASIC METABOLIC PANEL WITH GFR
BUN/Creatinine Ratio: 8 — ABNORMAL LOW (ref 12–28)
BUN: 13 mg/dL (ref 8–27)
CO2: 20 mmol/L (ref 20–29)
Calcium: 9.9 mg/dL (ref 8.7–10.3)
Chloride: 105 mmol/L (ref 96–106)
Creatinine, Ser: 1.55 mg/dL — ABNORMAL HIGH (ref 0.57–1.00)
Glucose: 100 mg/dL — ABNORMAL HIGH (ref 70–99)
Potassium: 4 mmol/L (ref 3.5–5.2)
Sodium: 143 mmol/L (ref 134–144)
eGFR: 36 mL/min/1.73 — ABNORMAL LOW (ref >59)

## 2023-11-20 LAB — MAGNESIUM: Magnesium: 2.2 mg/dL (ref 1.6–2.3)

## 2023-11-27 ENCOUNTER — Other Ambulatory Visit (HOSPITAL_COMMUNITY): Payer: Self-pay

## 2023-11-27 ENCOUNTER — Telehealth: Payer: Self-pay | Admitting: Cardiology

## 2023-11-27 ENCOUNTER — Telehealth: Payer: Self-pay

## 2023-11-27 NOTE — Telephone Encounter (Signed)
   Pre-operative Risk Assessment    Patient Name: Alexandria Leach  DOB: [birth year] MRN: [id number]   Date of last office visit: 07/08/23 REDELL SHALLOW, MD Date of next office visit: NONE  Request for Surgical Clearance    Procedure:  Dental Extraction - Amount of Teeth to be Pulled:  6 TEETH  Date of Surgery:  Clearance TBD     PER REQUEST ASAP PT IN PAIN                           Surgeon:  DR TOBIE  Surgeon's Group or Practice Name:  [facility]ENT[facility] Phone number:  [phone] Fax number:  [phone]   Type of Clearance Requested:   - Medical  - Pharmacy:  Hold Apixaban  (Eliquis )     Type of Anesthesia:  Local    Additional requests/questions:    SignedLucie DELENA Ku   11/27/2023, 3:44 PM

## 2023-11-27 NOTE — Telephone Encounter (Signed)
 Paper Work Dropped Off: Clearance for for dental treatment  Date: 11-27-23  Location of paper:  Put on [employer]

## 2023-11-27 NOTE — Telephone Encounter (Signed)
 Paper work was received. Preop clearance will be entered.

## 2023-11-28 ENCOUNTER — Other Ambulatory Visit: Payer: Self-pay | Admitting: Family Medicine

## 2023-11-28 DIAGNOSIS — K219 Gastro-esophageal reflux disease without esophagitis: Secondary | ICD-10-CM

## 2023-11-28 NOTE — Telephone Encounter (Signed)
   Patient Name: Alexandria Leach  DOB: 08/21/1950 MRN: [id number]  Primary Cardiologist: Redell Shallow, MD  Chart reviewed as part of pre-operative protocol coverage. Given past medical history and time since last visit, based on ACC/AHA guidelines, Cyrena Kuchenbecker is at acceptable risk for the planned procedure without further cardiovascular testing.   Patient DOES require pre-op antibiotics for dental procedure. Looks like she has a prescription for amoxicillin  with refills on it.    Per office protocol, patient can hold Eliquis  for 1 day prior to procedure.    The patient was advised that if she develops new symptoms prior to surgery to contact our office to arrange for a follow-up visit, and she verbalized understanding.  I will route this recommendation to the requesting party via Epic fax function and remove from pre-op pool.  Please call with questions.  Lamarr Satterfield, NP 11/28/2023, 1:20 PM

## 2023-11-28 NOTE — Telephone Encounter (Signed)
 Patient with diagnosis of aflutter on Eliquis  for anticoagulation.    Procedure:  Dental Extraction - Amount of Teeth to be Pulled:  6 TEETH  Date of procedure: TBD   CHA2DS2-VASc Score = 4   This indicates a 4.8% annual risk of stroke. The patient's score is based upon: CHF History: 1 HTN History: 1 Diabetes History: 0 Stroke History: 0 Vascular Disease History: 0 Age Score: 1 Gender Score: 1      CrCl 33 ml/min Platelet count 229  Hx of several DVT  Patient has not had an Afib/aflutter ablation or Watchman within the last 3 months or DCCV within the last 30 days   Patient DOES require pre-op antibiotics for dental procedure. Looks like she has a prescription for amoxicillin  with refills on it.   Per office protocol, patient can hold Eliquis  for 1 day prior to procedure.    **This guidance is not considered finalized until pre-operative APP has relayed final recommendations.**

## 2023-12-01 NOTE — Telephone Encounter (Signed)
 ERx

## 2023-12-02 ENCOUNTER — Telehealth (INDEPENDENT_AMBULATORY_CARE_PROVIDER_SITE_OTHER): Admitting: Psychiatry

## 2023-12-02 ENCOUNTER — Encounter (HOSPITAL_COMMUNITY): Payer: Self-pay | Admitting: Psychiatry

## 2023-12-02 DIAGNOSIS — F411 Generalized anxiety disorder: Secondary | ICD-10-CM

## 2023-12-02 MED ORDER — ZOLPIDEM TARTRATE 10 MG PO TABS: 10.0000 mg | ORAL_TABLET | Freq: Every evening | ORAL | 5 refills | Status: AC | PRN

## 2023-12-02 MED ORDER — ALPRAZOLAM 0.5 MG PO TABS: 0.5000 mg | ORAL_TABLET | Freq: Every day | ORAL | 5 refills | Status: AC

## 2023-12-02 NOTE — Progress Notes (Signed)
 Virtual Visit via Video Note  I connected with Alexandria Leach on 12/02/23 at 11:20 AM EDT by a video enabled telemedicine application and verified that I am speaking with the correct person using two identifiers.  Location: Patient: home Provider: office   I discussed the limitations of evaluation and management by telemedicine and the availability of in person appointments. The patient expressed understanding and agreed to proceed.      I discussed the assessment and treatment plan with the patient. The patient was provided an opportunity to ask questions and all were answered. The patient agreed with the plan and demonstrated an understanding of the instructions.   The patient was advised to call back or seek an in-person evaluation if the symptoms worsen or if the condition fails to improve as anticipated.  I provided 20 minutes of non-face-to-face time during this encounter.   Alexandria Gull, MD  [facility] MD/PA/NP OP Progress Note  12/02/2023 11:46 AM Alexandria Leach  MRN:  [id number]  Chief Complaint:  Chief Complaint  Patient presents with   Anxiety   Follow-up   HPI: This patient is a 73 year old separated black female lives alone in [place]. 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 She has 4 sons   The patient returns for follow-up after 3 months regarding her anxiety and and insomnia.  She is still very concerned about her son who is in a nursing home after having a stroke last December.  She states he seems depressed and unmotivated to work on his walking.  Before he went into a nursing home he was highly involved in substance abuse primarily cocaine.  I suggested she talk to his physician about getting him on an antidepressant.  In terms of her cell she is generally doing well.  Her anxiety is under good control.  She denies being depressed.  She has come to the realization that she cannot do  as much physically as she did as a younger person.  She is sleeping well.  She is getting out with her friends. Visit Diagnosis:    ICD-10-CM   1. GAD (generalized anxiety disorder)  F41.1       Past Psychiatric History: none  Past Medical History:  Past Medical History:  Diagnosis Date   Ankle fracture, left 02/19/2015   from a fall   Anxiety    Cataract 2014   corrected with surgery   CHF (congestive heart failure) (HCC)    CKD (chronic kidney disease) stage 3, GFR 30-59 ml/min [facility])    saw nephrologist Dr Juline   Community acquired pneumonia 11/29/2022   Multifocal s/p hospitalization 11/2022     COVID-19 09/21/2021   DDD (degenerative disc disease), cervical    Depression    Fibromyalgia    Glaucoma    s/p surgery, sees ophtho Q6 mo   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 Thought benign.    Osteoarthritis    shoulders and knees, not RA per Dr Dolphus, positive ANA, positive Ro   Osteopenia  09/18/2015   DEXA T -1.1 hip, -0.2 spine 08/2015    Personal history of urinary calculi latest 2014   Pneumonia 12/02/2011   PONV (postoperative nausea and vomiting)    Refusal of blood transfusions as patient is Jehovah's Witness    Rheumatic heart disease 1980   s/p mitral valve repair 1980   Sjogren's syndrome (HCC)    Trimalleolar fracture of left ankle 02/23/2015    Past Surgical History:  Procedure Laterality Date   BREAST BIOPSY Right 2006   benign   CARDIOVERSION N/A 07/18/2020   Procedure: CARDIOVERSION;  Surgeon: Loni Soyla LABOR, MD;  Location[facility] ENDOSCOPY;  Service: Cardiovascular;  Laterality: N/A;    CHOLECYSTECTOMY  11/27/2011   Procedure: LAPAROSCOPIC CHOLECYSTECTOMY WITH INTRAOPERATIVE CHOLANGIOGRAM;  Surgeon: Elon CHRISTELLA Pacini, MD;  Location: [facility] OR;  Service: General;  Laterality: N/A;  laparoscopic cholecystectomy with choleangiogram umbilical hernia repair   COLONOSCOPY  07/2014   WNL Kristen)   COLONOSCOPY  08/2019   3 TAs, rpt 3 yrs ([place])   dexa  08/2012   normal per patient - no records available   ESOPHAGOGASTRODUODENOSCOPY  08/2019   reactive gastropathy, neg H pylori ([place])   EYE SURGERY Bilateral 2014   cataract removal   MITRAL VALVE REPAIR  1980   open heart   ORIF ANKLE FRACTURE Left 02/26/2015   Procedure: OPEN REDUCTION INTERNAL FIXATION (ORIF) LEFT TRIMALLEOLAR ANKLE FRACTURE;  Surgeon: Kay CHRISTELLA Cummins, MD;  Location: [facility] OR;  Service: Orthopedics;  Laterality: Left;   TUBAL LIGATION  1980   UMBILICAL HERNIA REPAIR  11/27/2011   Procedure: HERNIA REPAIR UMBILICAL ADULT;  Surgeon: Elon CHRISTELLA Pacini, MD;  Location: [facility] OR;  Service: General;  Laterality: N/A;   VAGINAL HYSTERECTOMY  1992   for fibroids -- partial, ovaries remain    Family Psychiatric History: See below  Family History:  Family History  Problem Relation Age of Onset   Cancer Mother        lung (nonsmoker)   CAD Mother        MI in her 45s   ALS Mother    Kidney disease Father    Alcohol  abuse Father    Diabetes Father    Lupus Sister        and niece   Diabetes Sister    Stroke Sister    Depression Sister    Lupus Sister    Blindness Sister    Cancer Maternal Uncle        bone   Stroke Maternal Grandmother    Cancer Brother        bone   Diabetes Brother    Heart attack Brother    Healthy Son    Healthy Son    Healthy Son    Healthy Son    Kidney failure Other        on HD   Breast cancer Neg Hx     Social History:  Social History   Socioeconomic History   Marital status: Married    Spouse name: Adriana   Number of children: 4   Years of education: 12   Highest  education level: 12th grade  Occupational History    Employer: UNEMPLOYED    Comment: Disability  Tobacco Use   Smoking status: Never    Passive exposure: Past   Smokeless tobacco: Never   Tobacco comments:    Never smoke 08/29/21  Vaping Use   Vaping status: Never Used  Substance and Sexual Activity  Alcohol  use: No    Alcohol /week: 0.0 standard drinks of alcohol    Drug use: No   Sexual activity: Not Currently    Birth control/protection: Surgical  Other Topics Concern   Not on file  Social History Narrative   Lives with son, 1 dog   Occupation: unemployed, on disability for fibromyalgia since 2008.   Edu: HS   Religion: Jehova's witness   Activity: volunteers at senior center   Diet: some water, fruits/vegetables daily   No caffeine use   Social Drivers of Corporate investment banker Strain: Low Risk  (10/20/2023)   Overall Financial Resource Strain (CARDIA)    Difficulty of Paying Living Expenses: Not hard at all  Food Insecurity: No Food Insecurity (11/06/2023)   Hunger Vital Sign    Worried About Running Out of Food in the Last Year: Never true    Ran Out of Food in the Last Year: Never true  Transportation Needs: No Transportation Needs (11/06/2023)   PRAPARE - Administrator, Civil Service (Medical): No    Lack of Transportation (Non-Medical): No  Physical Activity: Sufficiently Active (10/20/2023)   Exercise Vital Sign    Days of Exercise per Week: 7 days    Minutes of Exercise per Session: 30 min  Stress: No Stress Concern Present (10/20/2023)   [employer] of Occupational Health - Occupational Stress Questionnaire    Feeling of Stress: Not at all  Social Connections: Moderately Integrated (10/20/2023)   Social Connection and Isolation Panel    Frequency of Communication with Friends and Family: Once a week    Frequency of Social Gatherings with Friends and Family: Never    Attends Religious Services: 1 to 4 times per year    Active Member of  [employer] or Organizations: Yes    Attends Banker Meetings: 1 to 4 times per year    Marital Status: Married    Allergies:  Allergies  Allergen Reactions   Cymbalta [Duloxetine Hcl] Other (See Comments)    tachycardia   Trazodone  Other (See Comments)    Patient states had chest pain, face pain and heart rate increased   Statins Nausea Only and Other (See Comments)    Muscle cramps also   Sulfa Antibiotics Nausea And Vomiting    Metabolic Disorder Labs: Lab Results  Component Value Date   HGBA1C 6.1 01/07/2023   MPG 126 01/12/2021   MPG 114 10/13/2015   No results found for: PROLACTIN Lab Results  Component Value Date   CHOL 222 (H) 07/08/2023   TRIG 93 07/08/2023   HDL 65 07/08/2023   CHOLHDL 3.4 07/08/2023   VLDL 81.7 01/07/2023   LDLCALC 141 (H) 07/08/2023   LDLCALC 137 (H) 01/07/2023   Lab Results  Component Value Date   TSH 4.00 01/07/2023   TSH 2.00 01/04/2022    Therapeutic Level Labs: No results found for: LITHIUM No results found for: VALPROATE No results found for: CBMZ  Current Medications: Current Outpatient Medications  Medication Sig Dispense Refill   acetaminophen  (TYLENOL ) 500 MG tablet Take 1 tablet (500 mg total) by mouth daily. With 2nd dose as needed for pain     ALPRAZolam  (XANAX ) 0.5 MG tablet Take 1 tablet (0.5 mg total) by mouth daily. 30 tablet 5   amoxicillin  (AMOXIL ) 500 MG tablet Take 4 tablets 1 hour prior to dental work 4 tablet 6   antiseptic oral rinse (BIOTENE) LIQD 15 mLs by Mouth Rinse route 2 (two) times daily as  needed for dry mouth.     apixaban  (ELIQUIS ) 5 MG TABS tablet TAKE 1 TABLET BY MOUTH TWICE  DAILY 200 tablet 1   cholecalciferol  (VITAMIN D ) 1000 UNITS tablet Take 1,000 Units by mouth daily.     cyanocobalamin  (VITAMIN B12) 1000 MCG tablet Take 1 tablet (1,000 mcg total) by mouth once a week.     diltiazem  (CARDIZEM  CD) 360 MG 24 hr capsule TAKE 1 CAPSULE BY MOUTH DAILY 90 capsule 2   docusate  sodium (COLACE) 100 MG capsule Take 100 mg by mouth daily.     dofetilide  (TIKOSYN ) 250 MCG capsule TAKE 1 CAPSULE BY MOUTH TWICE  DAILY 200 capsule 3   Evolocumab  (REPATHA  SURECLICK) 140 MG/ML SOAJ Inject 140 mg into the skin every 14 (fourteen) days. 6 mL 3   ezetimibe  (ZETIA ) 10 MG tablet Take 1 tablet (10 mg total) by mouth at bedtime. 100 tablet 3   furosemide  (LASIX ) 20 MG tablet Take 2 tablets (40 mg total) by mouth daily. 180 tablet 2   gabapentin  (NEURONTIN ) 300 MG capsule TAKE 2 CAPSULES BY MOUTH IN THE MORNING 1 CAPSULE BY  MOUTH IN THE AFTERNOON AND  2 CAPSULES BY MOUTH AT  BEDTIME 500 capsule 3   HYDROcodone -acetaminophen  (NORCO/VICODIN) 5-325 MG tablet Take 1 tablet by mouth every 6 (six) hours as needed for moderate pain (pain score 4-6). 20 tablet 0   Lifitegrast  (XIIDRA ) 5 % SOLN Place 1 drop into both eyes 2 (two) times daily. 180 each 4   loratadine  (CLARITIN ) 10 MG tablet Take 1 tablet (10 mg total) by mouth daily. 90 tablet 3   losartan  (COZAAR ) 100 MG tablet Take 0.5 tablets (50 mg total) by mouth daily. 50 tablet 3   magnesium  oxide (MAG-OX) 400 (240 Mg) MG tablet Take 1 tablet (400 mg total) by mouth daily.     metoprolol  tartrate (LOPRESSOR ) 50 MG tablet TAKE 1 AND 1/2 TABLETS BY MOUTH  TWICE DAILY 300 tablet 3   pantoprazole  (PROTONIX ) 40 MG tablet TAKE 1 TABLET BY MOUTH DAILY 100 tablet 0   potassium chloride  (KLOR-CON  M) 10 MEQ tablet TAKE 1 TABLET BY MOUTH TWICE  DAILY 200 tablet 0   thiamine  (VITAMIN B1) 100 MG tablet Take 1 tablet (100 mg total) by mouth once a week.     zolpidem  (AMBIEN ) 10 MG tablet Take 1 tablet (10 mg total) by mouth at bedtime as needed for sleep. 30 tablet 5   No current facility-administered medications for this visit.     Musculoskeletal: Strength & Muscle Tone: within normal limits Gait & Station: normal Patient leans: N/A  Psychiatric Specialty Exam: Review of Systems  Respiratory:  Positive for shortness of breath.    Cardiovascular:  Positive for palpitations.  All other systems reviewed and are negative.   There were no vitals taken for this visit.There is no height or weight on file to calculate BMI.  General Appearance: Casual and Well Groomed  Eye Contact:  Good  Speech:  Clear and Coherent  Volume:  Normal  Mood:  Euthymic  Affect:  Congruent  Thought Process:  Goal Directed  Orientation:  Full (Time, Place, and Person)  Thought Content: WDL   Suicidal Thoughts:  No  Homicidal Thoughts:  No  Memory:  Immediate;   Good Recent;   Good Remote;   Fair  Judgement:  Good  Insight:  Good  Psychomotor Activity:  Decreased  Concentration:  Concentration: Good and Attention Span: Good  Recall:  Good  Fund  of Knowledge: Good  Language: Good  Akathisia:  No  Handed:  Right  AIMS (if indicated): not done  Assets:  Communication Skills Desire for Improvement Resilience Social Support  ADL's:  Intact  Cognition: WNL  Sleep:  Good   Screenings: GAD-7    Garment/textile technologist Visit from 10/22/2023 in [facility] Visit from 09/10/2023 in [facility] Visit from 07/21/2023 in [facility] [place] Office Visit from 01/20/2023 in [facility] [place] [facility] Office Visit from 12/18/2022 in [facility]  Total GAD-7 Score 0 0 0 0 0   Mini-Mental    Flowsheet Row Clinical Support from 12/11/2017 in [facility] [place] [facility] Clinical Support from 12/09/2016 in [facility] Clinical Support from 11/30/2015 in [facility]  Total Score (max 30 points ) 20 19 18    PHQ2-9    Flowsheet Row Patient Outreach Telephone from 11/06/2023 in [place] [facility] POPULATION HEALTH DEPARTMENT Office Visit from 10/22/2023 in [facility] [facility] Patient Outreach  Telephone from 10/10/2023 in [place] POPULATION HEALTH DEPARTMENT Patient Outreach Telephone from 09/12/2023 in  POPULATION HEALTH DEPARTMENT Office Visit from 09/10/2023 in [facility]  PHQ-2 Total Score 0 0 0 0 0  PHQ-9 Total Score 0 0 -- -- --   Flowsheet Row ED from 09/07/2023 in [facility] Department at [facility] ED to Hosp-Admission (Discharged) from 11/29/2022 in [facility] 3E HF PCU UC from 02/09/2022 in [facility] at [facility] RISK CATEGORY No Risk No Risk No Risk     Assessment and Plan: This patient is a 73 year old female with a history of depression anxiety congestive heart failure Sjogren syndrome.  Her anxiety is well-controlled and she is sleeping well.  She will therefore continue Xanax  0.5 mg in the morning for anxiety and Ambien  at 10 mg at bedtime for sleep.  She will return to see me in 6 months  Collaboration of Care: Collaboration of Care: Primary Care Provider AEB notes are shared with PCP on the epic system  Patient/Guardian was advised Release of Information must be obtained prior to any record release in order to collaborate their care with an outside provider. Patient/Guardian was advised if they have not already done so to contact the registration department to sign all necessary forms in order for us  to release information regarding their care.   Consent: Patient/Guardian gives verbal consent for treatment and assignment of benefits for services provided during this visit. Patient/Guardian expressed understanding and agreed to proceed.    Alexandria Gull, MD 12/02/2023, 11:46 AM

## 2023-12-04 ENCOUNTER — Other Ambulatory Visit: Payer: Self-pay

## 2023-12-04 NOTE — Patient Instructions (Signed)
 Visit Information  Thank you for taking time to visit with me today. Please don't hesitate to contact me if I can be of assistance to you before our next scheduled appointment.  Your next care management appointment is by telephone on 01/01/2024  at 9:30 am    Telephone follow-up in 1 month  LCSW appointment 12/19/2023 at 10:00 am  Please call the care guide team at [phone] if you need to cancel, schedule, or reschedule an appointment.   Please call the Suicide and Crisis Lifeline: 988 call the [place]  National Suicide Prevention Lifeline: 801-407-5671 or TTY: (520)691-9210 TTY (941)343-1935) to talk to a trained counselor call 1-800-273-TALK (toll free, 24 hour hotline) go to [facility] [address] [place] [phone]) call 911 if you are experiencing a Mental Health or Behavioral Health Crisis or need someone to talk to.  Nestora Duos, MSN, RN [facility], [facility] Health RN Care Manager Direct Dial: [phone] Fax: [phone]'

## 2023-12-04 NOTE — Patient Outreach (Signed)
 Complex Care Management   Visit Note  12/04/2023  Name:  Alexandria Leach MRN: [id number] DOB: 02-24-1951  Situation: Referral received for Complex Care Management related to Heart Failure and Fibromyalgia I obtained verbal consent from Patient.  Visit completed with Patient  on the phone  Background:   Past Medical History:  Diagnosis Date   Ankle fracture, left 02/19/2015   from a fall   Anxiety    Cataract 2014   corrected with surgery   CHF (congestive heart failure) (HCC)    CKD (chronic kidney disease) stage 3, GFR 30-59 ml/min [facility])    saw nephrologist Dr Juline   Community acquired pneumonia 11/29/2022   Multifocal s/p hospitalization 11/2022     COVID-19 09/21/2021   DDD (degenerative disc disease), cervical    Depression    Fibromyalgia    Glaucoma    s/p surgery, sees ophtho Q6 mo   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 Thought benign.    Osteoarthritis    shoulders and knees, not RA per Dr Dolphus, positive ANA, positive Ro   Osteopenia 09/18/2015   DEXA T -1.1 hip, -0.2 spine 08/2015    Personal history of urinary calculi latest 2014   Pneumonia 12/02/2011   PONV (postoperative nausea and vomiting)    Refusal of blood transfusions as patient is Jehovah's Witness    Rheumatic heart disease 1980   s/p mitral valve repair 1980   Sjogren's syndrome (HCC)    Trimalleolar fracture of left ankle 02/23/2015    Assessment: Patient Reported Symptoms:  Cognitive Cognitive Status: No symptoms reported, Other: (considering to have son help with medical and financial) Cognitive/Intellectual Conditions Management  [RPT]: None reported or documented in medical history or problem list   Health Maintenance Behaviors: Annual physical exam, Sleep adequate, Healthy diet Healing Pattern: Slow Health Facilitated by: Pain control, Healthy diet  Neurological Neurological Review of Symptoms: Numbness, Other: Oher Neurological Symptoms/Conditions [RPT]: had some lightheadedness with anxiety/fatigue episode - BP was normal Neurological Management Strategies: Medication therapy Neurological Comment: neuropathy  HEENT HEENT Symptoms Reported: No symptoms reported HEENT Management Strategies: Routine screening    Cardiovascular Cardiovascular Symptoms Reported: Palpitations (palpitations with anxiety) Does patient have uncontrolled Hypertension?: No Cardiovascular Management Strategies: Medication therapy, Routine screening Weight: 190 lb (86.2 kg) Cardiovascular Comment: no CHF sx, reviewed CHF Action Plan  Respiratory Respiratory Symptoms Reported: No symptoms reported Respiratory Management Strategies: Routine screening  Endocrine Is patient diabetic?: No    Gastrointestinal Gastrointestinal Symptoms Reported: No symptoms reported Gastrointestinal Management Strategies: Medication therapy    Genitourinary Genitourinary Symptoms Reported: No symptoms reported    Integumentary Integumentary Symptoms Reported: No symptoms reported Skin Management Strategies: Routine screening  Musculoskeletal Musculoskelatal Symptoms Reviewed: Back pain, Difficulty walking, Joint pain, Limited mobility, Muscle pain Additional Musculoskeletal Details: cane and walker prn, continues to do PT exercises independently, feels stronger in legs, arms feel weaker - than legs, fibromyalgia, lupus, Musculoskeletal Management Strategies: Medication therapy, Routine screening Falls in the past year?: Yes Number of falls in past year: 1 or less Was there an injury with Fall?: No Fall  Risk Category Calculator: 1 Patient Fall Risk Level:  Low Fall Risk Patient at Risk for Falls Due to: History of fall(s), Impaired mobility, Medication side effect, Impaired balance/gait Fall risk Follow up: Falls evaluation completed, Falls prevention discussed  Psychosocial Psychosocial Symptoms Reported: Anxiety - if selected complete GAD Additional Psychological Details: saw psych, will fu q 6 mos, agreed to talk to LCSW for anxiety, has severe episode of anxiety/fatigue due to issue with son in nsg home, also has niece going into hosice in addition to own health issues Behavioral Management Strategies: Medication therapy Techniques to Cope with Loss/Stress/Change: Medication      12/04/2023    PHQ2-9 Depression Screening   Little interest or pleasure in doing things Not at all  Feeling down, depressed, or hopeless Not at all  PHQ-2 - Total Score 0  Trouble falling or staying asleep, or sleeping too much    Feeling tired or having little energy    Poor appetite or overeating     Feeling bad about yourself - or that you are a failure or have let yourself or your family down    Trouble concentrating on things, such as reading the newspaper or watching television    Moving or speaking so slowly that other people could have noticed.  Or the opposite - being so fidgety or restless that you have been moving around a lot more than usual    Thoughts that you would be better off dead, or hurting yourself in some way    PHQ2-9 Total Score    If you checked off any problems, how difficult have these problems made it for you to do your work, take care of things at home, or get along with other people    Depression Interventions/Treatment      Vitals:   12/04/23 1002  BP: 130/63    Medications Reviewed Today     Reviewed by Devra Lands, RN (Registered Nurse) on 12/04/23 at [phone]  Med List Status: <None>   Medication Order Taking? Sig Documenting Provider Last Dose Status Informant  acetaminophen  (TYLENOL ) 500 MG tablet [id number] Yes Take  1 tablet (500 mg total) by mouth daily. With 2nd dose as needed for pain Rilla Baller, MD  Active   ALPRAZolam  (XANAX ) 0.5 MG tablet [id number] Yes Take 1 tablet (0.5 mg total) by mouth daily. Okey Barnie SAUNDERS, MD  Active   amoxicillin  (AMOXIL ) 500 MG tablet [id number] Yes Take 4 tablets 1 hour prior to dental work Pietro Redell RAMAN, MD  Active   antiseptic oral rinse KEENAN) LIQD [id number] Yes 15 mLs by Mouth Rinse route 2 (two) times daily as needed for dry mouth. [provider]  Active Self  apixaban  (ELIQUIS ) 5 MG TABS tablet [id number] Yes TAKE 1 TABLET BY MOUTH TWICE  DAILY Crenshaw, Redell RAMAN, MD  Active   cholecalciferol  (VITAMIN D ) 1000 UNITS tablet [id number] Yes Take 1,000 Units by mouth daily. [provider]  Active Self  cyanocobalamin  (VITAMIN B12) 1000 MCG tablet [id number] Yes Take 1 tablet (1,000 mcg total) by mouth once a week. Rilla Baller, MD  Active Self           Med Note (CARD, AMY LITTIE   Kerman Nov 29, 2022  9:38 PM) Emily on Friday.  diltiazem  (CARDIZEM  CD) 360 MG 24 hr capsule [id number] Yes TAKE 1 CAPSULE BY MOUTH DAILY Crenshaw, Redell RAMAN, MD  Active   docusate sodium  (COLACE) 100 MG capsule [id number] Yes Take 100 mg by mouth daily. [provider]  Active Self  dofetilide  (TIKOSYN ) 250 MCG capsule [id number] Yes TAKE 1 CAPSULE BY MOUTH TWICE  DAILY Crenshaw, Redell RAMAN, MD  Active   Evolocumab  (REPATHA  SURECLICK) 140 MG/ML SOAJ [id number] Yes Inject 140 mg into the skin every 14 (fourteen) days. Pietro Redell RAMAN, MD  Active   ezetimibe  (ZETIA ) 10 MG tablet [id number] Yes Take 1 tablet (10 mg total) by mouth at bedtime. Rilla Baller, MD  Active   furosemide  (LASIX ) 20 MG tablet [id number] Yes Take 2 tablets (40 mg total) by mouth daily. Pietro Redell RAMAN, MD  Active   gabapentin  (NEURONTIN ) 300 MG capsule [id number] Yes TAKE 2 CAPSULES BY MOUTH IN THE MORNING 1 CAPSULE BY  MOUTH IN THE AFTERNOON AND  2 CAPSULES BY MOUTH AT  BEDTIME Rilla Baller, MD   Active   HYDROcodone -acetaminophen  (NORCO/VICODIN) 5-325 MG tablet [id number] Yes Take 1 tablet by mouth every 6 (six) hours as needed for moderate pain (pain score 4-6). Rilla Baller, MD  Active   Lifitegrast  (XIIDRA ) 5 % SOLN [id number] Yes Place 1 drop into both eyes 2 (two) times daily.   Active   loratadine  (CLARITIN ) 10 MG tablet [id number] Yes Take 1 tablet (10 mg total) by mouth daily. Rilla Baller, MD  Active Self  losartan  (COZAAR ) 100 MG tablet [id number] Yes Take 0.5 tablets (50 mg total) by mouth daily. Rilla Baller, MD  Active   magnesium  oxide (MAG-OX) 400 (240 Mg) MG tablet [id number] Yes Take 1 tablet (400 mg total) by mouth daily. Rilla Baller, MD  Active   metoprolol  tartrate (LOPRESSOR ) 50 MG tablet [id number] Yes TAKE 1 AND 1/2 TABLETS BY MOUTH  TWICE DAILY Pietro Redell RAMAN, MD  Active   pantoprazole  (PROTONIX ) 40 MG tablet [id number] Yes TAKE 1 TABLET BY MOUTH DAILY Rilla Baller, MD  Active   potassium chloride  (KLOR-CON  M) 10 MEQ tablet [id number] Yes TAKE 1 TABLET BY MOUTH TWICE  DAILY Rilla Baller, MD  Active   thiamine  (VITAMIN B1) 100 MG tablet [id number] Yes Take 1 tablet (100 mg total) by mouth once a week. Rilla Baller, MD  Active Self           Med Note (CARD, AMY LITTIE   [place]erman Nov 29, 2022  9:41 PM) Emily on Mondays.  zolpidem  (AMBIEN ) 10 MG tablet [id number] Yes Take 1 tablet (10 mg total) by mouth at bedtime as needed for sleep. Okey Barnie SAUNDERS, MD  Active             Recommendation:   PCP Follow-up Continue Current Plan of Care  Follow Up Plan:   Telephone follow-up in 1 month  Nestora Duos, MSN, RN [facility]  [facility], [facility] RN Care Manager Direct Dial: [phone] Fax: [phone]

## 2023-12-04 NOTE — Patient Outreach (Deleted)
 Complex Care Management   Visit Note  12/04/2023  Name:  Alexandria Leach MRN: [id number] DOB: 1950/12/16  Situation: Referral received for Complex Care Management related to Heart Failure and Fibromyalgia I obtained verbal consent from {CHL AMB Patient/Caregiver:28184}.  Visit completed with {CHL AMB Patient/Caregiver:28184}  {VISIT LOCATION:32553}  Background:   Past Medical History:  Diagnosis Date   Ankle fracture, left 02/19/2015   from a fall   Anxiety    Cataract 2014   corrected with surgery   CHF (congestive heart failure) (HCC)    CKD (chronic kidney disease) stage 3, GFR 30-59 ml/min [facility])    saw nephrologist Dr Juline   Community acquired pneumonia 11/29/2022   Multifocal s/p hospitalization 11/2022     COVID-19 09/21/2021   DDD (degenerative disc disease), cervical    Depression    Fibromyalgia    Glaucoma    s/p surgery, sees ophtho Q6 mo   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 Thought benign.    Osteoarthritis    shoulders and knees, not RA per Dr Dolphus, positive ANA, positive Ro   Osteopenia 09/18/2015   DEXA T -1.1 hip, -0.2 spine 08/2015    Personal history of urinary calculi latest 2014   Pneumonia 12/02/2011   PONV (postoperative nausea and vomiting)    Refusal of blood transfusions as patient is Jehovah's Witness    Rheumatic heart disease 1980   s/p mitral valve repair 1980   Sjogren's syndrome (HCC)    Trimalleolar fracture of left ankle 02/23/2015    Assessment: Patient Reported Symptoms:  Cognitive Cognitive Status: No symptoms reported, Other: (considering to have son help with medical  and financial) Cognitive/Intellectual Conditions Management [RPT]: None reported or documented in medical history or problem list   Health Maintenance Behaviors: Annual physical exam, Sleep adequate, Healthy diet Healing Pattern: Slow Health Facilitated by: Pain control, Healthy diet  Neurological Neurological Review of Symptoms: Numbness, Other: Oher Neurological Symptoms/Conditions [RPT]: had some lightheadedness with anxiety/fatigue episode - BP was normal Neurological Management Strategies: Medication therapy Neurological Comment: neuropathy  HEENT HEENT Symptoms Reported: No symptoms reported HEENT Management Strategies: Routine screening    Cardiovascular Cardiovascular Symptoms Reported: Palpitations (palpitations with anxiety) Does patient have uncontrolled Hypertension?: No Cardiovascular Management Strategies: Medication therapy, Routine screening Weight: 190 lb (86.2 kg) Cardiovascular Comment: no CHF sx, reviewed CHF Action Plan  Respiratory Respiratory Symptoms Reported: No symptoms reported Respiratory Management Strategies: Routine screening  Endocrine Is patient diabetic?: No    Gastrointestinal Gastrointestinal Symptoms Reported: No symptoms reported Gastrointestinal Management Strategies: Medication therapy    Genitourinary Genitourinary Symptoms Reported: No symptoms reported    Integumentary Integumentary Symptoms Reported: No symptoms reported Skin Management Strategies: Routine screening  Musculoskeletal Musculoskelatal Symptoms Reviewed: Back pain, Difficulty walking, Joint pain, Limited mobility, Muscle pain Additional Musculoskeletal Details: cane and walker prn, continues to do PT exercises independently, feels stronger in legs, arms feel weaker - than legs, fibromyalgia, lupus, Musculoskeletal Management Strategies: Medication therapy, Routine screening Falls in the past year?: Yes Number of falls in past year: 1 or less Was there an injury with  Fall?:  No Fall Risk Category Calculator: 1 Patient Fall Risk Level: Low Fall Risk Patient at Risk for Falls Due to: History of fall(s), Impaired mobility, Medication side effect, Impaired balance/gait Fall risk Follow up: Falls evaluation completed, Falls prevention discussed  Psychosocial Psychosocial Symptoms Reported: Anxiety - if selected complete GAD Additional Psychological Details: saw psych, will fu q 6 mos, agreed to talk to LCSW for anxiety, has severe episode of anxiety/fatigue due to issue with son in nsg home, also has niece going into hosice in addition to own health issues Behavioral Management Strategies: Medication therapy Techniques to Cope with Loss/Stress/Change: Medication      12/04/2023    PHQ2-9 Depression Screening   Little interest or pleasure in doing things Not at all  Feeling down, depressed, or hopeless Not at all  PHQ-2 - Total Score 0  Trouble falling or staying asleep, or sleeping too much    Feeling tired or having little energy    Poor appetite or overeating     Feeling bad about yourself - or that you are a failure or have let yourself or your family down    Trouble concentrating on things, such as reading the newspaper or watching television    Moving or speaking so slowly that other people could have noticed.  Or the opposite - being so fidgety or restless that you have been moving around a lot more than usual    Thoughts that you would be better off dead, or hurting yourself in some way    PHQ2-9 Total Score    If you checked off any problems, how difficult have these problems made it for you to do your work, take care of things at home, or get along with other people    Depression Interventions/Treatment      Vitals:   12/04/23 1002  BP: 130/63    Medications Reviewed Today     Reviewed by Devra Lands, RN (Registered Nurse) on 12/04/23 at [phone]  Med List Status: <None>   Medication Order Taking? Sig Documenting Provider Last Dose Status  Informant  acetaminophen  (TYLENOL ) 500 MG tablet [id number] Yes Take 1 tablet (500 mg total) by mouth daily. With 2nd dose as needed for pain Rilla Baller, MD  Active   ALPRAZolam  (XANAX ) 0.5 MG tablet [id number] Yes Take 1 tablet (0.5 mg total) by mouth daily. Okey Barnie SAUNDERS, MD  Active   amoxicillin  (AMOXIL ) 500 MG tablet [id number] Yes Take 4 tablets 1 hour prior to dental work Pietro Redell RAMAN, MD  Active   antiseptic oral rinse KEENAN) LIQD [id number] Yes 15 mLs by Mouth Rinse route 2 (two) times daily as needed for dry mouth. [provider]  Active Self  apixaban  (ELIQUIS ) 5 MG TABS tablet [id number] Yes TAKE 1 TABLET BY MOUTH TWICE  DAILY Crenshaw, Redell RAMAN, MD  Active   cholecalciferol  (VITAMIN D ) 1000 UNITS tablet [id number] Yes Take 1,000 Units by mouth daily. [provider]  Active Self  cyanocobalamin  (VITAMIN B12) 1000 MCG tablet [id number] Yes Take 1 tablet (1,000 mcg total) by mouth once a week. Rilla Baller, MD  Active Self           Med Note (CARD, AMY LITTIE   [place] Nov 29, 2022  9:38 PM) Emily on Friday.  diltiazem  (CARDIZEM  CD) 360 MG 24 hr capsule [id number] Yes TAKE 1 CAPSULE BY MOUTH DAILY Crenshaw, Redell RAMAN, MD  Active   docusate sodium  (COLACE) 100 MG capsule [id number] Yes Take 100 mg by  mouth daily. [provider]  Active Self  dofetilide  (TIKOSYN ) 250 MCG capsule [id number] Yes TAKE 1 CAPSULE BY MOUTH TWICE  DAILY Crenshaw, Redell RAMAN, MD  Active   Evolocumab  (REPATHA  SURECLICK) 140 MG/ML SOAJ [id number] Yes Inject 140 mg into the skin every 14 (fourteen) days. Pietro Redell RAMAN, MD  Active   ezetimibe  (ZETIA ) 10 MG tablet [id number] Yes Take 1 tablet (10 mg total) by mouth at bedtime. Rilla Baller, MD  Active   furosemide  (LASIX ) 20 MG tablet [id number] Yes Take 2 tablets (40 mg total) by mouth daily. Pietro Redell RAMAN, MD  Active   gabapentin  (NEURONTIN ) 300 MG capsule [id number] Yes TAKE 2 CAPSULES BY MOUTH IN THE MORNING 1 CAPSULE BY  MOUTH IN THE  AFTERNOON AND  2 CAPSULES BY MOUTH AT  BEDTIME Rilla Baller, MD  Active   HYDROcodone -acetaminophen  (NORCO/VICODIN) 5-325 MG tablet [id number] Yes Take 1 tablet by mouth every 6 (six) hours as needed for moderate pain (pain score 4-6). Rilla Baller, MD  Active   Lifitegrast  (XIIDRA ) 5 % SOLN [id number] Yes Place 1 drop into both eyes 2 (two) times daily.   Active   loratadine  (CLARITIN ) 10 MG tablet [id number] Yes Take 1 tablet (10 mg total) by mouth daily. Rilla Baller, MD  Active Self  losartan  (COZAAR ) 100 MG tablet [id number] Yes Take 0.5 tablets (50 mg total) by mouth daily. Rilla Baller, MD  Active   magnesium  oxide (MAG-OX) 400 (240 Mg) MG tablet [id number] Yes Take 1 tablet (400 mg total) by mouth daily. Rilla Baller, MD  Active   metoprolol  tartrate (LOPRESSOR ) 50 MG tablet [id number] Yes TAKE 1 AND 1/2 TABLETS BY MOUTH  TWICE DAILY Pietro Redell RAMAN, MD  Active   pantoprazole  (PROTONIX ) 40 MG tablet [id number] Yes TAKE 1 TABLET BY MOUTH DAILY Rilla Baller, MD  Active   potassium chloride  (KLOR-CON  M) 10 MEQ tablet [id number] Yes TAKE 1 TABLET BY MOUTH TWICE  DAILY Rilla Baller, MD  Active   thiamine  (VITAMIN B1) 100 MG tablet [id number] Yes Take 1 tablet (100 mg total) by mouth once a week. Rilla Baller, MD  Active Self           Med Note (CARD, AMY LITTIE   [place] Nov 29, 2022  9:41 PM) Emily on Mondays.  zolpidem  (AMBIEN ) 10 MG tablet [id number] Yes Take 1 tablet (10 mg total) by mouth at bedtime as needed for sleep. Okey Barnie SAUNDERS, MD  Active             Recommendation:   {RECOMMENDATONS:32554}  Follow Up Plan:   {FOLLOWUP:32559}  SIG ***

## 2023-12-19 ENCOUNTER — Other Ambulatory Visit: Payer: Self-pay | Admitting: Licensed Clinical Social Worker

## 2023-12-19 ENCOUNTER — Telehealth: Admitting: *Deleted

## 2023-12-19 NOTE — Patient Instructions (Signed)
 Visit Information  Thank you for taking time to visit with me today. Please don't hesitate to contact me if I can be of assistance to you before our next scheduled appointment.  Our next appointment is by telephone on 01/09/24 at 10am. Please call the care guide team at [phone] if you need to cancel or reschedule your appointment.   Following is a copy of your care plan:   Goals Addressed             This Visit's Progress    [facility] Social Work Care Plan LCSW       Problems:   Mental Health Concerns :Anxiety  CSW Clinical Goal(s):   Over the next 90 days the Patient will demonstrate a reduction in symptoms related to Anxiety with Social Anxiety, as evidence by reduction in anxiety symptoms and medical adherence.   Interventions:  Mental Health:  Evaluation of current treatment plan related to Anxiety with Social Anxiety, Active listening / Reflection utilized Depression screen reviewed Emotional Support Provided PHQ2/PHQ9 completed Discussed previous home health aid agency Encouraged to find new home health agency Discussed coping skills in social situations Discussed enrolling in therapy services- patient declined Reviewed mental health medications and discussed importance of compliance: xanax   Patient Goals/Self-Care Activities:  Continue taking your medication as prescribed.   Continue with psychiatry provider. Find new home health aid  Plan:   Telephone follow up appointment with care management team member scheduled for:  01/09/24 at 10am.         Please call the Suicide and Crisis Lifeline: 988 call the [place]  National Suicide Prevention Lifeline: 434 201 5508 or TTY: 701-481-6673 TTY (334)508-8068) to talk to a trained counselor call 1-800-273-TALK (toll free, 24 hour hotline) call 911 if you are experiencing a Mental Health or Behavioral Health Crisis or need someone to talk to.  Patient verbalizes understanding of instructions and care plan provided  today and agrees to view in MyChart. Active MyChart status and patient understanding of how to access instructions and care plan via MyChart confirmed with patient.     Cena Ligas, LCSW Clinical Social Worker [facility]

## 2023-12-19 NOTE — Patient Outreach (Signed)
 Complex Care Management   Visit Note  12/19/2023  Name:  Alexandria Leach MRN: [id number] DOB: Jul 25, 1950  Situation: Referral received for Complex Care Management related to Mental/Behavioral Health diagnosis anxiety I obtained verbal consent from Patient.  Visit completed with Patient  on the phone  Background:   Past Medical History:  Diagnosis Date   Ankle fracture, left 02/19/2015   from a fall   Anxiety    Cataract 2014   corrected with surgery   CHF (congestive heart failure) (HCC)    CKD (chronic kidney disease) stage 3, GFR 30-59 ml/min [facility])    saw nephrologist Dr Juline   Community acquired pneumonia 11/29/2022   Multifocal s/p hospitalization 11/2022     COVID-19 09/21/2021   DDD (degenerative disc disease), cervical    Depression    Fibromyalgia    Glaucoma    s/p surgery, sees ophtho Q6 mo   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 Thought benign.    Osteoarthritis    shoulders and knees, not RA per Dr Dolphus, positive ANA, positive Ro   Osteopenia 09/18/2015   DEXA T -1.1 hip, -0.2 spine 08/2015    Personal history of urinary calculi latest 2014   Pneumonia 12/02/2011   PONV (postoperative nausea and vomiting)    Refusal of blood transfusions as patient is Jehovah's Witness    Rheumatic heart disease 1980   s/p mitral valve repair 1980   Sjogren's syndrome    Trimalleolar fracture of left ankle 02/23/2015    Assessment: Patient Reported Symptoms:  Cognitive Cognitive Status: No symptoms reported   Health Maintenance Behaviors: Annual physical exam, Sleep adequate, Healthy diet, Spiritual  practice(s) Healing Pattern: Slow Health Facilitated by: Healthy diet, Rest  Neurological Neurological Review of Symptoms: No symptoms reported Neurological Management Strategies: Medication therapy  HEENT HEENT Symptoms Reported: No symptoms reported HEENT Management Strategies: Medication therapy, Routine screening    Cardiovascular Cardiovascular Symptoms Reported: Palpitations Does patient have uncontrolled Hypertension?: No Cardiovascular Management Strategies: Medication therapy, Routine screening  Respiratory Respiratory Symptoms Reported: No symptoms reported    Endocrine Endocrine Symptoms Reported: Not assessed Is patient diabetic?: No    Gastrointestinal Gastrointestinal Symptoms Reported: Not assessed      Genitourinary Genitourinary Symptoms Reported: Not assessed    Integumentary Integumentary Symptoms Reported: Not assessed    Musculoskeletal Musculoskelatal Symptoms Reviewed: Not assessed        Psychosocial Psychosocial Symptoms Reported: Anxiety - if selected complete GAD Additional Psychological Details: Patient sees a psychiatrist and is prescribed xanax      Quality of Family Relationships: involved Do you feel physically threatened by others?: No    12/19/2023    PHQ2-9 Depression Screening   Little interest or pleasure in doing things Not at all  Feeling down, depressed, or hopeless Not at all  PHQ-2 - Total Score 0  Trouble falling or staying asleep, or sleeping too much    Feeling tired or having little energy    Poor appetite or overeating     Feeling bad about yourself - or that you are a failure or have  let yourself or your family down    Trouble concentrating on things, such as reading the newspaper or watching television    Moving or speaking so slowly that other people could have noticed.  Or the opposite - being so fidgety or restless that you have been moving around a lot more than usual    Thoughts that you would be better off dead, or  hurting yourself in some way    PHQ2-9 Total Score    If you checked off any problems, how difficult have these problems made it for you to do your work, take care of things at home, or get along with other people    Depression Interventions/Treatment      There were no vitals filed for this visit.  Medications Reviewed Today     Reviewed by Veva Bolt, LCSW (Social Worker) on 12/19/23 at 1122  Med List Status: <None>   Medication Order Taking? Sig Documenting Provider Last Dose Status Informant  acetaminophen  (TYLENOL ) 500 MG tablet [id number]  Take 1 tablet (500 mg total) by mouth daily. With 2nd dose as needed for pain Rilla Baller, MD  Active   ALPRAZolam  (XANAX ) 0.5 MG tablet [id number]  Take 1 tablet (0.5 mg total) by mouth daily. Okey Barnie SAUNDERS, MD  Active   amoxicillin  (AMOXIL ) 500 MG tablet [id number]  Take 4 tablets 1 hour prior to dental work Pietro Redell RAMAN, MD  Active   antiseptic oral rinse KEENAN) LIQD [id number]  15 mLs by Mouth Rinse route 2 (two) times daily as needed for dry mouth. [provider]  Active Self  apixaban  (ELIQUIS ) 5 MG TABS tablet [id number]  TAKE 1 TABLET BY MOUTH TWICE  DAILY Crenshaw, Redell RAMAN, MD  Active   cholecalciferol  (VITAMIN D ) 1000 UNITS tablet [id number]  Take 1,000 Units by mouth daily. [provider]  Active Self  cyanocobalamin  (VITAMIN B12) 1000 MCG tablet [id number]  Take 1 tablet (1,000 mcg total) by mouth once a week. Rilla Baller, MD  Active Self           Med Note (CARD, AMY LITTIE   [place] Nov 29, 2022  9:38 PM) Emily on Friday.  diltiazem  (CARDIZEM  CD) 360 MG 24 hr capsule [id number]  TAKE 1 CAPSULE BY MOUTH DAILY Crenshaw, Redell RAMAN, MD  Active   docusate sodium  (COLACE) 100 MG capsule [id number]  Take 100 mg by mouth daily. [provider]  Active Self  dofetilide  (TIKOSYN ) 250 MCG capsule [id number]  TAKE 1 CAPSULE BY MOUTH TWICE  DAILY Crenshaw, Redell RAMAN, MD  Active   Evolocumab  (REPATHA  SURECLICK) 140  MG/ML SOAJ [id number]  Inject 140 mg into the skin every 14 (fourteen) days. Pietro Redell RAMAN, MD  Active   ezetimibe  (ZETIA ) 10 MG tablet [id number]  Take 1 tablet (10 mg total) by mouth at bedtime. Rilla Baller, MD  Active   furosemide  (LASIX ) 20 MG tablet [id number]  Take 2 tablets (40 mg total) by mouth daily. Pietro Redell RAMAN, MD  Active   gabapentin  (NEURONTIN ) 300 MG capsule [id number]  TAKE 2 CAPSULES BY MOUTH IN THE MORNING 1 CAPSULE BY  MOUTH IN THE AFTERNOON AND  2 CAPSULES BY MOUTH AT  BEDTIME Rilla Baller, MD  Active   HYDROcodone -acetaminophen  (NORCO/VICODIN) 5-325 MG tablet [id number]  Take 1 tablet by mouth every 6 (six) hours as needed for moderate pain (pain score 4-6). Rilla Baller, MD  Active   Lifitegrast  (XIIDRA ) 5 % SOLN [id number]  Place 1 drop into both eyes  2 (two) times daily.   Active   loratadine  (CLARITIN ) 10 MG tablet [id number]  Take 1 tablet (10 mg total) by mouth daily. Rilla Baller, MD  Active Self  losartan  (COZAAR ) 100 MG tablet [id number]  Take 0.5 tablets (50 mg total) by mouth daily. Rilla Baller, MD  Active   magnesium  oxide (MAG-OX) 400 (240 Mg) MG tablet [id number]  Take 1 tablet (400 mg total) by mouth daily. Rilla Baller, MD  Active   metoprolol  tartrate (LOPRESSOR ) 50 MG tablet [id number]  TAKE 1 AND 1/2 TABLETS BY MOUTH  TWICE DAILY Pietro Redell RAMAN, MD  Active   pantoprazole  (PROTONIX ) 40 MG tablet [id number]  TAKE 1 TABLET BY MOUTH DAILY Rilla Baller, MD  Active   potassium chloride  (KLOR-CON  M) 10 MEQ tablet [id number]  TAKE 1 TABLET BY MOUTH TWICE  DAILY Rilla Baller, MD  Active   thiamine  (VITAMIN B1) 100 MG tablet [id number]  Take 1 tablet (100 mg total) by mouth once a week. Rilla Baller, MD  Active Self           Med Note (CARD, AMY LITTIE   [place] Nov 29, 2022  9:41 PM) Emily on Mondays.  zolpidem  (AMBIEN ) 10 MG tablet [id number]  Take 1 tablet (10 mg total) by mouth at bedtime as needed for sleep. Okey Barnie SAUNDERS, MD   Active             Recommendation:   Follow up with PCP Follow up with psychiatry for med managment  Follow Up Plan:   Telephone follow up appointment date/time:  01/09/24 at 10am  Cena Ligas, LCSW Clinical Social Worker [facility]

## 2023-12-21 ENCOUNTER — Emergency Department (HOSPITAL_BASED_OUTPATIENT_CLINIC_OR_DEPARTMENT_OTHER)
Admission: EM | Admit: 2023-12-21 | Discharge: 2023-12-21 | Disposition: A | Discharge status: Home / Self Care | Attending: Emergency Medicine | Admitting: Emergency Medicine

## 2023-12-21 ENCOUNTER — Emergency Department (HOSPITAL_BASED_OUTPATIENT_CLINIC_OR_DEPARTMENT_OTHER)

## 2023-12-21 DIAGNOSIS — M545 Low back pain, unspecified: Secondary | ICD-10-CM | POA: Insufficient documentation

## 2023-12-21 DIAGNOSIS — M5459 Other low back pain: Secondary | ICD-10-CM | POA: Diagnosis not present

## 2023-12-21 DIAGNOSIS — Z7901 Long term (current) use of anticoagulants: Secondary | ICD-10-CM | POA: Insufficient documentation

## 2023-12-21 DIAGNOSIS — M47816 Spondylosis without myelopathy or radiculopathy, lumbar region: Secondary | ICD-10-CM | POA: Diagnosis not present

## 2023-12-21 LAB — URINALYSIS, ROUTINE W REFLEX MICROSCOPIC
Bacteria, UA: NONE SEEN
Bilirubin Urine: NEGATIVE
Glucose, UA: NEGATIVE mg/dL
Hgb urine dipstick: NEGATIVE
Ketones, ur: NEGATIVE mg/dL
Nitrite: NEGATIVE
Protein, ur: NEGATIVE mg/dL
Specific Gravity, Urine: 1.005 (ref 1.005–1.030)
pH: 7.5 (ref 5.0–8.0)

## 2023-12-21 MED ORDER — LIDOCAINE 5 % EX PTCH: 1.0000 | MEDICATED_PATCH | CUTANEOUS | 0 refills | Status: DC

## 2023-12-21 MED ORDER — LIDOCAINE 5 % EX PTCH
1.0000 | MEDICATED_PATCH | CUTANEOUS | Status: DC
  Administered 2023-12-21: 1 via TRANSDERMAL
  Filled 2023-12-21: qty 1

## 2023-12-21 NOTE — ED Provider Notes (Signed)
 [facility] AT [facility] Provider Note   CSN: [id number] Arrival date & time: 12/21/23  1240     Patient presents with: Back Pain   Alexandria Leach is a 73 y.o. female.   73 year old female presenting with back pain.  Patient reports that symptoms been ongoing since early September, denies any fall/injury/inciting event.  Patient reports that pain is in her lower back and radiates around to the front of her abdomen, describes this as a hurting pain but denies sharp/stabbing nature of pain, she has been managing this with Tylenol  with minimal relief of her symptoms, she also takes gabapentin  for chronic pain secondary to fibromyalgia.  She reports that pain is worse when she goes from a sitting to standing position or when she has been sitting for long periods of time.  She is on Eliquis .  History of osteoporosis/osteopenia.  She denies any radiation of the pain down her legs, no numbness/tingling sensation characterized with the pain.   Back Pain      Prior to Admission medications   Medication Sig Start Date End Date Taking? Authorizing Provider  acetaminophen  (TYLENOL ) 500 MG tablet Take 1 tablet (500 mg total) by mouth daily. With 2nd dose as needed for pain 10/22/23   Rilla Baller, MD  ALPRAZolam  (XANAX ) 0.5 MG tablet Take 1 tablet (0.5 mg total) by mouth daily. 12/02/23   Okey Barnie SAUNDERS, MD  amoxicillin  (AMOXIL ) 500 MG tablet Take 4 tablets 1 hour prior to dental work 07/08/23   Pietro Redell RAMAN, MD  antiseptic oral rinse (BIOTENE) LIQD 15 mLs by Mouth Rinse route 2 (two) times daily as needed for dry mouth.    [provider]  apixaban  (ELIQUIS ) 5 MG TABS tablet TAKE 1 TABLET BY MOUTH TWICE  DAILY 07/17/23   Pietro Redell RAMAN, MD  cholecalciferol  (VITAMIN D ) 1000 UNITS tablet Take 1,000 Units by mouth daily.    [provider]  cyanocobalamin  (VITAMIN B12) 1000 MCG tablet Take 1 tablet (1,000 mcg total) by mouth once a  week. 01/11/22   Rilla Baller, MD  diltiazem  (CARDIZEM  CD) 360 MG 24 hr capsule TAKE 1 CAPSULE BY MOUTH DAILY 10/16/23   Pietro Redell RAMAN, MD  docusate sodium  (COLACE) 100 MG capsule Take 100 mg by mouth daily.    [provider]  dofetilide  (TIKOSYN ) 250 MCG capsule TAKE 1 CAPSULE BY MOUTH TWICE  DAILY 10/27/23   Pietro Redell RAMAN, MD  Evolocumab  (REPATHA  SURECLICK) 140 MG/ML SOAJ Inject 140 mg into the skin every 14 (fourteen) days. 09/09/23   Pietro Redell RAMAN, MD  ezetimibe  (ZETIA ) 10 MG tablet Take 1 tablet (10 mg total) by mouth at bedtime. 01/20/23   Rilla Baller, MD  furosemide  (LASIX ) 20 MG tablet Take 2 tablets (40 mg total) by mouth daily. 11/14/23   Pietro Redell RAMAN, MD  gabapentin  (NEURONTIN ) 300 MG capsule TAKE 2 CAPSULES BY MOUTH IN THE MORNING 1 CAPSULE BY  MOUTH IN THE AFTERNOON AND  2 CAPSULES BY MOUTH AT  BEDTIME 01/20/23   Rilla Baller, MD  HYDROcodone -acetaminophen  (NORCO/VICODIN) 5-325 MG tablet Take 1 tablet by mouth every 6 (six) hours as needed for moderate pain (pain score 4-6). 10/22/23   Rilla Baller, MD  Lifitegrast  (XIIDRA ) 5 % SOLN Place 1 drop into both eyes 2 (two) times daily. 09/08/23     loratadine  (CLARITIN ) 10 MG tablet Take 1 tablet (10 mg total) by mouth daily. 02/19/22   Rilla Baller, MD  losartan  (COZAAR ) 100 MG  tablet Take 0.5 tablets (50 mg total) by mouth daily. 01/20/23   Rilla Baller, MD  magnesium  oxide (MAG-OX) 400 (240 Mg) MG tablet Take 1 tablet (400 mg total) by mouth daily. 01/20/23   Rilla Baller, MD  metoprolol  tartrate (LOPRESSOR ) 50 MG tablet TAKE 1 AND 1/2 TABLETS BY MOUTH  TWICE DAILY 07/21/23   Pietro Redell RAMAN, MD  pantoprazole  (PROTONIX ) 40 MG tablet TAKE 1 TABLET BY MOUTH DAILY 12/01/23   Rilla Baller, MD  potassium chloride  (KLOR-CON  M) 10 MEQ tablet TAKE 1 TABLET BY MOUTH TWICE  DAILY 12/01/23   Rilla Baller, MD  thiamine  (VITAMIN B1) 100 MG tablet Take 1 tablet (100 mg total) by mouth once a  week. 07/31/22   Rilla Baller, MD  zolpidem  (AMBIEN ) 10 MG tablet Take 1 tablet (10 mg total) by mouth at bedtime as needed for sleep. 12/02/23 03/01/24  Okey Barnie SAUNDERS, MD    Allergies: Cymbalta [duloxetine hcl], Trazodone , Statins, and Sulfa antibiotics    Review of Systems  Musculoskeletal:  Positive for back pain.    Updated Vital Signs  Vitals:   12/21/23 1255  BP: 136/72  Pulse: 70  Resp: 16  Temp: 97.9 F (36.6 C)  TempSrc: Temporal  SpO2: 98%     Physical Exam Vitals and nursing note reviewed.  HENT:     Head: Normocephalic.  Eyes:     Extraocular Movements: Extraocular movements intact.  Cardiovascular:     Rate and Rhythm: Normal rate and regular rhythm.  Pulmonary:     Effort: Pulmonary effort is normal.     Breath sounds: Normal breath sounds.  Abdominal:     Palpations: Abdomen is soft.     Tenderness: There is no abdominal tenderness. There is no guarding.  Musculoskeletal:     Cervical back: Normal range of motion and neck supple. No tenderness.     Comments: Back: No C-spine/T-spine TTP, L-spine with midline TTP and associated paralumbar muscle tenderness bilaterally, no bony deformity/step-off.  Able to lift legs off of bed, however this movement elicits pain and is limited L>R  Skin:    General: Skin is warm and dry.  Neurological:     Mental Status: She is alert and oriented to person, place, and time.     Sensory: No sensory deficit.     (all labs ordered are listed, but only abnormal results are displayed) Labs Reviewed  URINALYSIS, ROUTINE W REFLEX MICROSCOPIC    EKG: None  Radiology: CT Lumbar Spine Wo Contrast Result Date: 12/21/2023 CLINICAL DATA:  midline L spine tenderness, no recent fall/injury EXAM: CT LUMBAR SPINE WITHOUT CONTRAST TECHNIQUE: Multidetector CT imaging of the lumbar spine was performed without intravenous contrast administration. Multiplanar CT image reconstructions were also generated. RADIATION DOSE  REDUCTION: This exam was performed according to the departmental dose-optimization program which includes automated exposure control, adjustment of the mA and/or kV according to patient size and/or use of iterative reconstruction technique. COMPARISON:  X-ray lumbar spine 10/21/2019 FINDINGS: Segmentation: 5 lumbar type vertebrae. Alignment: Normal. Vertebrae: Multilevel mild to moderate degenerative changes of the spine. Bilateral L5-S1 pseudoarthrosis. No acute fracture or focal pathologic process. Paraspinal and other soft tissues: Negative. Disc levels: Maintained. Other: Atherosclerotic plaque.  Punctate right nephrolithiasis. IMPRESSION: No acute displaced fracture or traumatic listhesis of the lumbar spine. Electronically Signed   By: Morgane  Naveau M.D.   On: 12/21/2023 15:41     Procedures   Medications Ordered in the ED  lidocaine  (LIDODERM ) 5 % 1 patch (  has no administration in time range)                                    Medical Decision Making This patient presents to the ED for concern of back pain, this involves an extensive number of treatment options, and is a complaint that carries with it a high risk of complications and morbidity.  The differential diagnosis includes degenerative disc disease, osteoarthritis, disc herniation, cauda equina, muscle pain/strain/sprain   Co morbidities that complicate the patient evaluation  History of osteopenia/osteoporosis, fibromyalgia, on blood thinners for intermittent atrial flutter   Additional history obtained:  Additional history obtained from record review External records from outside source obtained and reviewed including previous cardiology note   Lab Tests:  I Ordered, and personally interpreted labs.  The pertinent results include: Urinalysis with trace leukocytes, otherwise unremarkable, no obvious infection   Imaging Studies ordered:  I ordered imaging studies including CT L-spine  I independently visualized  and interpreted imaging which showed No acute displaced fracture or traumatic listhesis of the lumbar spine.  I agree with the radiologist interpretation   Cardiac Monitoring: / EKG:  The patient was maintained on a cardiac monitor.  I personally viewed and interpreted the cardiac monitored which showed an underlying rhythm of: NSR   Problem List / ED Course / Critical interventions / Medication management  I ordered medication including lidocaine  patch  for back pain  Reevaluation of the patient after these medicines showed that the patient improved I have reviewed the patients home medicines and have made adjustments as needed   Test / Admission - Considered:  Physical exam notable as above, patient does have midline L-spine tenderness to palpation with associated paralumbar muscle tenderness bilaterally, I have a very low suspicion for acute fracture however she does have a history of osteopenia/osteoporosis and does appear to be in a great deal of pain during my exam, will evaluate further with CT imaging for potential occult fracture.  No radicular type symptoms, no red flag back pain signs/symptoms. Low suspicion for spinal cord compression.  Patient denies dysuria but does endorse that her back pain wraps around to her lower abdomen, will assess with urinalysis.  CT is reassuring, urinalysis without overt signs of infection.  I suspect the patient's symptoms are secondary to arthritic changes, she has noted some relief of pain with lidocaine  patches, I recommend that she continue these as well as increase her dose of Tylenol , as she typically only takes 500 mg every other day.  She is on blood thinners and unfortunately cannot take NSAIDs for relief of her symptoms.  I will provide her with contact information for an orthopedic specialist to schedule follow-up.  She voiced understanding and is in agreement with this, strict return precautions discussed.  She is appropriate discharge at  this time.     Amount and/or Complexity of Data Reviewed Labs: ordered. Radiology: ordered.  Risk Prescription drug management.        Final diagnoses:  Midline low back pain without sciatica, unspecified chronicity    ED Discharge Orders     None          Valencia Kassa N, PA-C 12/21/23 1558    Patsey Lot, MD 12/21/23 2321

## 2023-12-21 NOTE — ED Triage Notes (Signed)
 Pt reports bilateral lumbar pain that wraps around both sides, worse with bending and movement and worsening in intensity for past month. Pt denies any urinary symptoms, nausea, radiation down legs or up back, and reports heating pad at home provides some relief.  Pt has also taken tylenol  OTC at home with some minimal relief.

## 2023-12-21 NOTE — ED Notes (Signed)
 Reviewed AVS/discharge instruction with patient. Time allotted for and all questions answered. Patient is agreeable for d/c and escorted to ed exit by staff.

## 2023-12-21 NOTE — Discharge Instructions (Addendum)
 Continue Tylenol  for relief of your back pain, do not exceed recommended daily dosing as indicated on the bottle.  Continue lidocaine  patches as needed for pain.  I have provided you with the contact information for an orthopedic specialist, please schedule follow-up in regard to your back pain.  Return to the emergency department if your symptoms worsen.

## 2023-12-25 ENCOUNTER — Ambulatory Visit: Payer: Self-pay

## 2023-12-25 NOTE — Telephone Encounter (Signed)
 FYI Only or Action Required?: FYI only for provider.  Patient was last seen in primary care on 10/22/2023 by Rilla Baller, MD.  Called Nurse Triage reporting Back Pain.  Symptoms began several days ago.  Interventions attempted:  Lidocaine  patch, Tylenol  .  Symptoms are: unchanged.  Triage Disposition: See HCP Within 4 Hours (Or PCP Triage)  Patient/caregiver understands and will follow disposition?: Yes  **Appt. Scheduled for 10/10**       Copied from CRM #[id number]. Topic: Clinical - Red Word Triage >> Dec 25, 2023 12:31 PM Alexandria Leach wrote: Red Word that prompted transfer to Nurse Triage: Patient states she having extreme back pain that is going up her abdomen Reason for Disposition  [1] SEVERE back pain (e.g., excruciating, unable to do any normal activities) AND [2] not improved 2 hours after pain medicine  Answer Assessment - Initial Assessment Questions 1. ONSET: When did the pain begin? (e.g., minutes, hours, days)      Ongoing since Sept. 23 rd, seen in ED on 10/5 , given a Lidocaine  patch; referred to PCP and spine doctor  2. LOCATION: Where does it hurt? (upper, mid or lower back)     Lower back, radiates to abdomen  3. SEVERITY: How bad is the pain?  (e.g., Scale 1-10; mild, moderate, or severe)     Moderate-Severe   4. PATTERN: Is the pain constant? (e.g., yes, no; constant, intermittent)      Constant with movement   5. RADIATION: Does the pain shoot into your legs or somewhere else?     Abdomen   6. CAUSE:  What do you think is causing the back pain?      Patient stated ED provider suspected arthritis   7. BACK OVERUSE:  Any recent lifting of heavy objects, strenuous work or exercise?     No   8. MEDICINES: What have you taken so far for the pain? (e.g., nothing, acetaminophen , NSAIDS)     Lidocaine  patch, Tylenol    9. NEUROLOGIC SYMPTOMS: Do you have any weakness, numbness, or problems with bowel/bladder control?     No    10. OTHER SYMPTOMS: Do you have any other symptoms? (e.g., fever, abdomen pain, burning with urination, blood in urine)  abdomen pain     Patient seen in the Ed on 10/5 for symptoms. ED suspects arthritis, no significant findings noted. Back pain persists.  Appt. Scheduled for 10/10 to follow up  Protocols used: Back Pain-A-AH

## 2023-12-26 ENCOUNTER — Ambulatory Visit (INDEPENDENT_AMBULATORY_CARE_PROVIDER_SITE_OTHER): Admitting: Family Medicine

## 2023-12-26 VITALS — BP 110/72 | HR 72 | Temp 97.9°F | Ht 61.0 in | Wt 184.0 lb

## 2023-12-26 DIAGNOSIS — G8929 Other chronic pain: Secondary | ICD-10-CM

## 2023-12-26 DIAGNOSIS — M545 Low back pain, unspecified: Secondary | ICD-10-CM

## 2023-12-26 NOTE — Patient Instructions (Addendum)
 Can try higher dose Tylenol  to 500 mg 2 tablets  every eight hours.  Can increase gabapentin  at bedtime to  tablets as long as no over sedation.  Continue lidocaine  patches.  Start PT .  Keep planned OV with Orthopedic.

## 2023-12-26 NOTE — Assessment & Plan Note (Signed)
 Acute flare of chronic issue  Can try higher dose Tylenol  to 500 mg 2 tablets  every eight hours.  Can increase gabapentin  at bedtime to  tablets as long as no over sedation.  Continue lidocaine  patches.  Refused PT .  Keep planned OV with Orthopedic for consideration of ESI.

## 2023-12-26 NOTE — Progress Notes (Signed)
 Patient ID: Alexandria Leach, female    DOB: 05-24-1950, 73 y.o.   MRN: [id number]  This visit was conducted in person.  BP 110/72   Pulse 72   Temp 97.9 F (36.6 C) (Temporal)   Ht 5' 1 (1.549 m)   Wt 184 lb (83.5 kg)   SpO2 99%   BMI 34.77 kg/m    CC:  Chief Complaint  Patient presents with   Back Pain    Pt complains of lower back pain that radiates to her lower abdomen. Pt states symptoms 10/3. States of slight arthritis. Had a normal CT scan. Used lidocaine  patch but only helped a little.     Subjective:   HPI: Alexandria Leach is a 73 y.o. female presenting on 12/26/2023 for Back Pain (Pt complains of lower back pain that radiates to her lower abdomen. Pt states symptoms 10/3. States of slight arthritis. Had a normal CT scan. Used lidocaine  patch but only helped a little. )  Patient reports worsening t low back pain that radiates to her lower abdomen starting on October 3.. Appears the lower back pain is chronic now since early September.  No preceding fall/injury/change in activity.  No weakness or numbness in legs. Better with sitting.  Keeping her up at night some.  She was seen in the ED on October 5.  Note reviewed in detail. Pain reported to be worse going from sitting to standing. Urinalysis unremarkable CT lumbar spine showedIMPRESSION: Multilevel mild to moderate degenerative changes of the spine. Bilateral L5-S1 pseudoarthrosis. No acute displaced fracture or traumatic listhesis of the lumbar spine. Recommended lidocaine  patches and increasing the dose of Tylenol . 500 mg every 6hours... she feels this helps some.  Pain 8/10.   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 2 cap in AM, 1 in midday, 2 in PM She has a history of osteoporosis/osteopenia    Has appt to see  Ortho for this issue 11/5   Relevant past medical, surgical, family and social history reviewed  and updated as indicated. Interim medical history since our last visit reviewed. Allergies and medications reviewed and updated. Outpatient Medications Prior to Visit  Medication Sig Dispense Refill   acetaminophen  (TYLENOL ) 500 MG tablet Take 1 tablet (500 mg total) by mouth daily. With 2nd dose as needed for pain     ALPRAZolam  (XANAX ) 0.5 MG tablet Take 1 tablet (0.5 mg total) by mouth daily. 30 tablet 5   amoxicillin  (AMOXIL ) 500 MG tablet Take 4 tablets 1 hour prior to dental work 4 tablet 6   antiseptic oral rinse (BIOTENE) LIQD 15 mLs by Mouth Rinse route 2 (two) times daily as needed for dry mouth.     apixaban  (ELIQUIS ) 5 MG TABS tablet TAKE 1 TABLET BY MOUTH TWICE  DAILY 200 tablet 1   cholecalciferol  (VITAMIN D ) 1000 UNITS tablet Take 1,000 Units by mouth daily.     cyanocobalamin  (VITAMIN B12) 1000 MCG tablet Take 1 tablet (1,000 mcg total) by mouth once a week.     diltiazem  (CARDIZEM  CD) 360 MG 24 hr capsule TAKE 1 CAPSULE BY MOUTH DAILY 90 capsule 2   docusate sodium  (COLACE) 100 MG capsule Take 100 mg by mouth daily.     dofetilide  (TIKOSYN ) 250 MCG capsule TAKE 1 CAPSULE BY MOUTH TWICE  DAILY 200 capsule 3   Evolocumab  (REPATHA  SURECLICK) 140 MG/ML SOAJ Inject 140 mg into the skin  every 14 (fourteen) days. 6 mL 3   ezetimibe  (ZETIA ) 10 MG tablet Take 1 tablet (10 mg total) by mouth at bedtime. 100 tablet 3   furosemide  (LASIX ) 20 MG tablet Take 2 tablets (40 mg total) by mouth daily. 180 tablet 2   gabapentin  (NEURONTIN ) 300 MG capsule TAKE 2 CAPSULES BY MOUTH IN THE MORNING 1 CAPSULE BY  MOUTH IN THE AFTERNOON AND  2 CAPSULES BY MOUTH AT  BEDTIME 500 capsule 3   HYDROcodone -acetaminophen  (NORCO/VICODIN) 5-325 MG tablet Take 1 tablet by mouth every 6 (six) hours as needed for moderate pain (pain score 4-6). 20 tablet 0   lidocaine  (LIDODERM ) 5 % Place 1 patch onto the skin daily. Remove & Discard patch within 12 hours or as directed by MD 30 patch 0   Lifitegrast  (XIIDRA ) 5 %  SOLN Place 1 drop into both eyes 2 (two) times daily. 180 each 4   loratadine  (CLARITIN ) 10 MG tablet Take 1 tablet (10 mg total) by mouth daily. 90 tablet 3   losartan  (COZAAR ) 100 MG tablet Take 0.5 tablets (50 mg total) by mouth daily. 50 tablet 3   magnesium  oxide (MAG-OX) 400 (240 Mg) MG tablet Take 1 tablet (400 mg total) by mouth daily.     metoprolol  tartrate (LOPRESSOR ) 50 MG tablet TAKE 1 AND 1/2 TABLETS BY MOUTH  TWICE DAILY 300 tablet 3   pantoprazole  (PROTONIX ) 40 MG tablet TAKE 1 TABLET BY MOUTH DAILY 100 tablet 0   potassium chloride  (KLOR-CON  M) 10 MEQ tablet TAKE 1 TABLET BY MOUTH TWICE  DAILY 200 tablet 0   thiamine  (VITAMIN B1) 100 MG tablet Take 1 tablet (100 mg total) by mouth once a week.     zolpidem  (AMBIEN ) 10 MG tablet Take 1 tablet (10 mg total) by mouth at bedtime as needed for sleep. 30 tablet 5   No facility-administered medications prior to visit.     Per HPI unless specifically indicated in ROS section below Review of Systems  Constitutional:  Negative for fatigue and fever.  HENT:  Negative for congestion.   Eyes:  Negative for pain.  Respiratory:  Negative for cough and shortness of breath.   Cardiovascular:  Negative for chest pain, palpitations and leg swelling.  Gastrointestinal:  Negative for abdominal pain.  Genitourinary:  Negative for dysuria and vaginal bleeding.  Musculoskeletal:  Positive for back pain.  Neurological:  Negative for syncope, light-headedness and headaches.  Psychiatric/Behavioral:  Negative for dysphoric mood.    Objective:  BP 110/72   Pulse 72   Temp 97.9 F (36.6 C) (Temporal)   Ht 5' 1 (1.549 m)   Wt 184 lb (83.5 kg)   SpO2 99%   BMI 34.77 kg/m   Wt Readings from Last 3 Encounters:  12/26/23 184 lb (83.5 kg)  12/04/23 190 lb (86.2 kg)  11/19/23 190 lb 9.6 oz (86.5 kg)      Physical Exam Constitutional:      General: She is not in acute distress.    Appearance: Normal appearance. She is well-developed. She is  not ill-appearing or toxic-appearing.  HENT:     Head: Normocephalic.     Right Ear: Hearing, tympanic membrane, ear canal and external ear normal. Tympanic membrane is not erythematous, retracted or bulging.     Left Ear: Hearing, tympanic membrane, ear canal and external ear normal. Tympanic membrane is not erythematous, retracted or bulging.     Nose: No mucosal edema or rhinorrhea.     Right Sinus:  No maxillary sinus tenderness or frontal sinus tenderness.     Left Sinus: No maxillary sinus tenderness or frontal sinus tenderness.     Mouth/Throat:     Pharynx: Uvula midline.  Eyes:     General: Lids are normal. Lids are everted, no foreign bodies appreciated.     Conjunctiva/sclera: Conjunctivae normal.     Pupils: Pupils are equal, round, and reactive to light.  Neck:     Thyroid : No thyroid  mass or thyromegaly.     Vascular: No carotid bruit.     Trachea: Trachea normal.  Cardiovascular:     Rate and Rhythm: Normal rate and regular rhythm.     Pulses: Normal pulses.     Heart sounds: Normal heart sounds, S1 normal and S2 normal. No murmur heard.    No friction rub. No gallop.  Pulmonary:     Effort: Pulmonary effort is normal. No tachypnea or respiratory distress.     Breath sounds: Normal breath sounds. No decreased breath sounds, wheezing, rhonchi or rales.  Abdominal:     General: Bowel sounds are normal.     Palpations: Abdomen is soft.     Tenderness: There is no abdominal tenderness. There is no right CVA tenderness, left CVA tenderness, guarding or rebound.     Hernia: No hernia is present.  Musculoskeletal:     Cervical back: Normal, normal range of motion and neck supple.     Thoracic back: Normal.     Lumbar back: Tenderness and bony tenderness present. No spasms. Decreased range of motion. Negative right straight leg raise test and negative left straight leg raise test.     Comments: Midline ttp bilateral paraspinous muscles   Skin:    General: Skin is warm and  dry.     Findings: No rash.  Neurological:     Mental Status: She is alert.  Psychiatric:        Mood and Affect: Mood is not anxious or depressed.        Speech: Speech normal.        Behavior: Behavior normal. Behavior is cooperative.        Thought Content: Thought content normal.        Judgment: Judgment normal.       Results for orders placed or performed during the hospital encounter of 12/21/23  Urinalysis, Routine w reflex microscopic -Urine, Clean Catch   Collection Time: 12/21/23  3:09 PM  Result Value Ref Range   Color, Urine COLORLESS (A) YELLOW   APPearance CLEAR CLEAR   Specific Gravity, Urine 1.005 1.005 - 1.030   pH 7.5 5.0 - 8.0   Glucose, UA NEGATIVE NEGATIVE mg/dL   Hgb urine dipstick NEGATIVE NEGATIVE   Bilirubin Urine NEGATIVE NEGATIVE   Ketones, ur NEGATIVE NEGATIVE mg/dL   Protein, ur NEGATIVE NEGATIVE mg/dL   Nitrite NEGATIVE NEGATIVE   Leukocytes,Ua TRACE (A) NEGATIVE   RBC / HPF 0-5 0 - 5 RBC/hpf   WBC, UA 0-5 0 - 5 WBC/hpf   Bacteria, UA NONE SEEN NONE SEEN   Squamous Epithelial / HPF 0-5 0 - 5 /HPF    Assessment and Plan  Chronic midline low back pain without sciatica Assessment & Plan:  Acute flare of chronic issue  Can try higher dose Tylenol  to 500 mg 2 tablets  every eight hours.  Can increase gabapentin  at bedtime to  tablets as long as no over sedation.  Continue lidocaine  patches.  Refused PT .  Keep planned OV  with Orthopedic for consideration of ESI.  Orders: -     Ambulatory referral to Physical Therapy    No follow-ups on file.   Greig Ring, MD

## 2023-12-26 NOTE — Telephone Encounter (Signed)
 Appreciate Dr Avelina seeing pleasant patient.

## 2024-01-01 ENCOUNTER — Telehealth

## 2024-01-07 ENCOUNTER — Ambulatory Visit

## 2024-01-08 ENCOUNTER — Encounter: Payer: Self-pay | Admitting: Pharmacist

## 2024-01-08 NOTE — Progress Notes (Signed)
 Pharmacy Quality Measure Review  This patient is appearing on a report for being at risk of failing the adherence measure for cholesterol (statin) medications this calendar year.   Medication: lovastatin  Last fill date: 07/26/23 for 38 day supply  Medication no longer prescribed.  Stopped per patient preference (side effects).

## 2024-01-09 ENCOUNTER — Other Ambulatory Visit: Payer: Self-pay | Admitting: Licensed Clinical Social Worker

## 2024-01-09 NOTE — Patient Outreach (Signed)
 Complex Care Management   Visit Note  01/09/2024  Name:  Alexandria Leach MRN: [id number] DOB: [birth year]  Situation: Referral received for Complex Care Management related to Mental/Behavioral Health diagnosis   I obtained verbal consent from Patient.  Visit completed with Patient  on the phone  Background:   Past Medical History:  Diagnosis Date   Ankle fracture, left 02/19/2015   from a fall   Anxiety    Cataract 2014   corrected with surgery   CHF (congestive heart failure) (HCC)    CKD (chronic kidney disease) stage 3, GFR 30-59 ml/min [facility])    saw nephrologist Dr Juline   Community acquired pneumonia 11/29/2022   Multifocal s/p hospitalization 11/2022     COVID-19 09/21/2021   DDD (degenerative disc disease), cervical    Depression    Fibromyalgia    Glaucoma    s/p surgery, sees ophtho Q6 mo   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 Thought benign.    Osteoarthritis    shoulders and knees, not RA per Dr Dolphus, positive ANA, positive Ro   Osteopenia 09/18/2015   DEXA T -1.1 hip, -0.2 spine 08/2015    Personal history of urinary calculi latest 2014   Pneumonia 12/02/2011   PONV (postoperative nausea and vomiting)    Refusal of blood transfusions as patient is Jehovah's Witness    Rheumatic heart disease 1980   s/p mitral valve repair 1980   Sjogren's syndrome    Trimalleolar fracture of left ankle 02/23/2015    Assessment: Patient Reported Symptoms:  Cognitive Cognitive Status: Alert and oriented to person, place, and time, Normal speech and language skills Cognitive/Intellectual Conditions Management  [RPT]: None reported or documented in medical history or problem list   Healing Pattern: Average Health Facilitated by: Stress management  Neurological Neurological Review of Symptoms: No symptoms reported    HEENT HEENT Symptoms Reported: No symptoms reported      Cardiovascular Cardiovascular Symptoms Reported: No symptoms reported    Respiratory Respiratory Symptoms Reported: No symptoms reported    Endocrine Endocrine Symptoms Reported: No symptoms reported    Gastrointestinal Gastrointestinal Symptoms Reported: No symptoms reported      Genitourinary Genitourinary Symptoms Reported: No symptoms reported    Integumentary Integumentary Symptoms Reported: No symptoms reported    Musculoskeletal Musculoskelatal Symptoms Reviewed: Back pain Additional Musculoskeletal Details: REcent back pain- went to ED will follow up with doctor next week Musculoskeletal Management Strategies: Medication therapy, Routine screening      Psychosocial            01/09/2024    PHQ2-9 Depression Screening   Little interest or pleasure in doing things    Feeling down, depressed, or hopeless    PHQ-2 - Total Score    Trouble falling or staying asleep, or sleeping too much    Feeling tired or having little energy    Poor appetite or overeating     Feeling bad about yourself - or that you are a failure or have let yourself or your family down    Trouble concentrating on things, such  as reading the newspaper or watching television    Moving or speaking so slowly that other people could have noticed.  Or the opposite - being so fidgety or restless that you have been moving around a lot more than usual    Thoughts that you would be better off dead, or hurting yourself in some way    PHQ2-9 Total Score    If you checked off any problems, how difficult have these problems made it for you to do your work, take care of things at home, or get along with other people    Depression  Interventions/Treatment      There were no vitals filed for this visit.  Medications Reviewed Today     Reviewed by Veva Bolt, LCSW (Social Worker) on 01/09/24 at 1030  Med List Status: <None>   Medication Order Taking? Sig Documenting Provider Last Dose Status Informant  acetaminophen  (TYLENOL ) 500 MG tablet [id number]  Take 1 tablet (500 mg total) by mouth daily. With 2nd dose as needed for pain Rilla Baller, MD  Active   ALPRAZolam  (XANAX ) 0.5 MG tablet [id number]  Take 1 tablet (0.5 mg total) by mouth daily. Okey Barnie SAUNDERS, MD  Active   amoxicillin  (AMOXIL ) 500 MG tablet [id number]  Take 4 tablets 1 hour prior to dental work Pietro Redell RAMAN, MD  Active   antiseptic oral rinse KEENAN) LIQD [id number]  15 mLs by Mouth Rinse route 2 (two) times daily as needed for dry mouth. [provider]  Active Self  apixaban  (ELIQUIS ) 5 MG TABS tablet [id number]  TAKE 1 TABLET BY MOUTH TWICE  DAILY Crenshaw, Redell RAMAN, MD  Active   cholecalciferol  (VITAMIN D ) 1000 UNITS tablet [id number]  Take 1,000 Units by mouth daily. [provider]  Active Self  cyanocobalamin  (VITAMIN B12) 1000 MCG tablet [id number]  Take 1 tablet (1,000 mcg total) by mouth once a week. Rilla Baller, MD  Active Self           Med Note (CARD, AMY LITTIE   [place] Nov 29, 2022  9:38 PM) Emily on Friday.  diltiazem  (CARDIZEM  CD) 360 MG 24 hr capsule [id number]  TAKE 1 CAPSULE BY MOUTH DAILY Crenshaw, Redell RAMAN, MD  Active   docusate sodium  (COLACE) 100 MG capsule [id number]  Take 100 mg by mouth daily. [provider]  Active Self  dofetilide  (TIKOSYN ) 250 MCG capsule [id number]  TAKE 1 CAPSULE BY MOUTH TWICE  DAILY Crenshaw, Redell RAMAN, MD  Active   Evolocumab  (REPATHA  SURECLICK) 140 MG/ML SOAJ [id number]  Inject 140 mg into the skin every 14 (fourteen) days. Pietro Redell RAMAN, MD  Active   ezetimibe  (ZETIA ) 10 MG tablet [id number]  Take 1 tablet (10 mg total) by mouth at bedtime. Rilla Baller, MD  Active    furosemide  (LASIX ) 20 MG tablet [id number]  Take 2 tablets (40 mg total) by mouth daily. Pietro Redell RAMAN, MD  Active   gabapentin  (NEURONTIN ) 300 MG capsule [id number]  TAKE 2 CAPSULES BY MOUTH IN THE MORNING 1 CAPSULE BY  MOUTH IN THE AFTERNOON AND  2 CAPSULES BY MOUTH AT  BEDTIME Rilla Baller, MD  Active   HYDROcodone -acetaminophen  (NORCO/VICODIN) 5-325 MG tablet [id number]  Take 1 tablet by mouth every 6 (six) hours as needed for moderate pain (pain score 4-6). Rilla Baller, MD  Active   lidocaine  (LIDODERM ) 5 % [id number]  Place 1 patch onto the skin daily. Remove & Discard patch within 12 hours or as directed by MD Glendia Longs  N, PA-C  Active   Lifitegrast  (XIIDRA ) 5 % SOLN [id number]  Place 1 drop into both eyes 2 (two) times daily.   Active   loratadine  (CLARITIN ) 10 MG tablet [id number]  Take 1 tablet (10 mg total) by mouth daily. Rilla Baller, MD  Active Self  losartan  (COZAAR ) 100 MG tablet [id number]  Take 0.5 tablets (50 mg total) by mouth daily. Rilla Baller, MD  Active   magnesium  oxide (MAG-OX) 400 (240 Mg) MG tablet [id number]  Take 1 tablet (400 mg total) by mouth daily. Rilla Baller, MD  Active   metoprolol  tartrate (LOPRESSOR ) 50 MG tablet [id number]  TAKE 1 AND 1/2 TABLETS BY MOUTH  TWICE DAILY Pietro Redell RAMAN, MD  Active   pantoprazole  (PROTONIX ) 40 MG tablet [id number]  TAKE 1 TABLET BY MOUTH DAILY Rilla Baller, MD  Active   potassium chloride  (KLOR-CON  M) 10 MEQ tablet [id number]  TAKE 1 TABLET BY MOUTH TWICE  DAILY Rilla Baller, MD  Active   thiamine  (VITAMIN B1) 100 MG tablet [id number]  Take 1 tablet (100 mg total) by mouth once a week. Rilla Baller, MD  Active Self           Med Note (CARD, AMY LITTIE   [place] Nov 29, 2022  9:41 PM) Emily on Mondays.  zolpidem  (AMBIEN ) 10 MG tablet [id number]  Take 1 tablet (10 mg total) by mouth at bedtime as needed for sleep. Okey Barnie SAUNDERS, MD  Active             Recommendation:   PCP Follow-up Continue  Current Plan of Care  Follow Up Plan:   Telephone follow up appointment date/time:  01/30/24 at 10am.  Cena Ligas, LCSW Clinical Social Worker [facility]

## 2024-01-12 ENCOUNTER — Other Ambulatory Visit: Payer: Self-pay | Admitting: Family Medicine

## 2024-01-12 DIAGNOSIS — N1832 Chronic kidney disease, stage 3b: Secondary | ICD-10-CM

## 2024-01-12 DIAGNOSIS — E7800 Pure hypercholesterolemia, unspecified: Secondary | ICD-10-CM

## 2024-01-12 DIAGNOSIS — R7989 Other specified abnormal findings of blood chemistry: Secondary | ICD-10-CM

## 2024-01-12 DIAGNOSIS — R7303 Prediabetes: Secondary | ICD-10-CM

## 2024-01-12 DIAGNOSIS — E538 Deficiency of other specified B group vitamins: Secondary | ICD-10-CM

## 2024-01-12 DIAGNOSIS — E519 Thiamine deficiency, unspecified: Secondary | ICD-10-CM

## 2024-01-14 ENCOUNTER — Other Ambulatory Visit: Payer: Self-pay

## 2024-01-14 ENCOUNTER — Other Ambulatory Visit (INDEPENDENT_AMBULATORY_CARE_PROVIDER_SITE_OTHER)

## 2024-01-14 DIAGNOSIS — N1832 Chronic kidney disease, stage 3b: Secondary | ICD-10-CM | POA: Diagnosis not present

## 2024-01-14 DIAGNOSIS — R7989 Other specified abnormal findings of blood chemistry: Secondary | ICD-10-CM | POA: Diagnosis not present

## 2024-01-14 DIAGNOSIS — R946 Abnormal results of thyroid function studies: Secondary | ICD-10-CM | POA: Diagnosis not present

## 2024-01-14 DIAGNOSIS — E519 Thiamine deficiency, unspecified: Secondary | ICD-10-CM

## 2024-01-14 DIAGNOSIS — E538 Deficiency of other specified B group vitamins: Secondary | ICD-10-CM

## 2024-01-14 DIAGNOSIS — E7800 Pure hypercholesterolemia, unspecified: Secondary | ICD-10-CM | POA: Diagnosis not present

## 2024-01-14 DIAGNOSIS — R7303 Prediabetes: Secondary | ICD-10-CM | POA: Diagnosis not present

## 2024-01-14 LAB — COMPREHENSIVE METABOLIC PANEL WITH GFR
ALT: 26 U/L (ref 0–35)
AST: 25 U/L (ref 0–37)
Albumin: 4.3 g/dL (ref 3.5–5.2)
Alkaline Phosphatase: 62 U/L (ref 39–117)
BUN: 14 mg/dL (ref 6–23)
CO2: 24 meq/L (ref 19–32)
Calcium: 9.8 mg/dL (ref 8.4–10.5)
Chloride: 107 meq/L (ref 96–112)
Creatinine, Ser: 1.25 mg/dL — ABNORMAL HIGH (ref 0.40–1.20)
GFR: 43.38 mL/min — ABNORMAL LOW (ref >60.00)
Glucose, Bld: 122 mg/dL — ABNORMAL HIGH (ref 70–99)
Potassium: 4.3 meq/L (ref 3.5–5.1)
Sodium: 140 meq/L (ref 135–145)
Total Bilirubin: 0.4 mg/dL (ref 0.2–1.2)
Total Protein: 7.2 g/dL (ref 6.0–8.3)

## 2024-01-14 LAB — CBC WITH DIFFERENTIAL/PLATELET
Basophils Absolute: 0 K/uL (ref 0.0–0.1)
Basophils Relative: 0.5 % (ref 0.0–3.0)
Eosinophils Absolute: 0.2 K/uL (ref 0.0–0.7)
Eosinophils Relative: 3.3 % (ref 0.0–5.0)
HCT: 38.3 % (ref 36.0–46.0)
Hemoglobin: 12.4 g/dL (ref 12.0–15.0)
Lymphocytes Relative: 29.5 % (ref 12.0–46.0)
Lymphs Abs: 1.4 K/uL (ref 0.7–4.0)
MCHC: 32.3 g/dL (ref 30.0–36.0)
MCV: 84.9 fl (ref 78.0–100.0)
Monocytes Absolute: 0.5 K/uL (ref 0.1–1.0)
Monocytes Relative: 9.5 % (ref 3.0–12.0)
Neutro Abs: 2.8 K/uL (ref 1.4–7.7)
Neutrophils Relative %: 57.2 % (ref 43.0–77.0)
Platelets: 233 K/uL (ref 150.0–400.0)
RBC: 4.51 Mil/uL (ref 3.87–5.11)
RDW: 14.9 % (ref 11.5–15.5)
WBC: 4.9 K/uL (ref 4.0–10.5)

## 2024-01-14 LAB — TSH: TSH: 3.21 u[IU]/mL (ref 0.35–5.50)

## 2024-01-14 LAB — LIPID PANEL
Cholesterol: 134 mg/dL (ref 0–200)
HDL: 66.2 mg/dL (ref >39.00)
LDL Cholesterol: 50 mg/dL (ref 0–99)
NonHDL: 67.75
Total CHOL/HDL Ratio: 2
Triglycerides: 91 mg/dL (ref 0.0–149.0)
VLDL: 18.2 mg/dL (ref 0.0–40.0)

## 2024-01-14 LAB — MICROALBUMIN / CREATININE URINE RATIO
Creatinine,U: 134.2 mg/dL
Microalb Creat Ratio: UNDETERMINED mg/g (ref 0.0–30.0)
Microalb, Ur: <0.7 mg/dL

## 2024-01-14 LAB — HEMOGLOBIN A1C: Hgb A1c MFr Bld: 6.2 % (ref 4.6–6.5)

## 2024-01-14 LAB — PHOSPHORUS: Phosphorus: 3.2 mg/dL (ref 2.3–4.6)

## 2024-01-14 LAB — VITAMIN B12: Vitamin B-12: 559 pg/mL (ref 211–911)

## 2024-01-14 LAB — VITAMIN D 25 HYDROXY (VIT D DEFICIENCY, FRACTURES): VITD: 40.47 ng/mL (ref 30.00–100.00)

## 2024-01-14 LAB — T4, FREE: Free T4: 0.68 ng/dL (ref 0.60–1.60)

## 2024-01-14 NOTE — Patient Instructions (Signed)
 Visit Information  Thank you for taking time to visit with me today. Please don't hesitate to contact me if I can be of assistance to you before our next scheduled appointment.  Your next care management appointment is by telephone on 02/09/2024 at 10:00 am  Telephone follow-up in 1 month  Please call the care guide team at [phone] if you need to cancel, schedule, or reschedule an appointment.   Please call the Suicide and Crisis Lifeline: 988 call the [place]  National Suicide Prevention Lifeline: 202-766-5615 or TTY: 904-564-9513 TTY 934 802 9922) to talk to a trained counselor call 1-800-273-TALK (toll free, 24 hour hotline) go to [facility] [address], [place] [id number]) call 911 if you are experiencing a Mental Health or Behavioral Health Crisis or need someone to talk to.  Nestora Duos, MSN, RN [place]  [facility], [facility] RN Care Manager Direct Dial: [phone] Fax: [phone]   Chronic Back Pain Chronic back pain is back pain that lasts longer than 3 months. The cause of your back pain may not be known. Some common causes include: Wear and tear (degenerative disease) of the bones, disks, or tissues that connect bones to each other (ligaments) in your back. Inflammation and stiffness in your back (arthritis). If you have chronic back pain, you may have times when the pain is more intense (flare-ups). You can also learn to manage the pain with home care. Follow these instructions at home: Watch for any changes in your symptoms. Take these actions to help with your pain: Managing pain and stiffness     If told, put ice on the painful area. You may be told to apply ice for the first 24-48 hours after a flare-up starts. Put ice in a plastic bag. Place a towel between your skin and the bag. Leave the ice on for 20 minutes, 2-3 times per day. If told, apply heat to the affected area as  often as told by your health care provider. Use the heat source that your provider recommends, such as a moist heat pack or a heating pad. Place a towel between your skin and the heat source. Leave the heat on for 20-30 minutes. If your skin turns bright red, remove the ice or heat right away to prevent skin damage. The risk of damage is higher if you cannot feel pain, heat, or cold. Try soaking in a warm tub. Activity        Avoid bending and other activities that make the pain worse. Have good posture when you stand or sit. When you stand, keep your upper back and neck straight, with your shoulders pulled back. Avoid slouching. When you sit, keep your back straight. Relax your shoulders. Do not round your shoulders or pull them backward. Do not sit or stand in one place for too long. Take brief periods of rest during the day. This will reduce your pain. Resting in a lying or standing position is often better than sitting to rest. When you rest for longer periods, mix in some mild activity or stretching between periods of rest. This will help to prevent stiffness and pain. Get regular exercise. Ask your provider what activities are safe for you. You may have to avoid lifting. Ask your provider how much you can safely lift. If you do lift, always use the right technique. This means you should: Bend your knees. Keep the load close to your body. Avoid twisting. Medicines Take over-the-counter and prescription medicines only as  told by your provider. You may need to take medicines for pain and inflammation. These may be taken by mouth or put on the skin. You may also be given muscle relaxants. Ask your provider if the medicine prescribed to you: Requires you to avoid driving or using machinery. Can cause constipation. You may need to take these actions to prevent or treat constipation: Drink enough fluid to keep your pee (urine) pale yellow. Take over-the-counter or prescription  medicines. Eat foods that are high in fiber, such as beans, whole grains, and fresh fruits and vegetables. Limit foods that are high in fat and processed sugars, such as fried or sweet foods. General instructions  Sleep on a firm mattress in a comfortable position. Try lying on your side with your knees slightly bent. If you lie on your back, put a pillow under your knees. Do not use any products that contain nicotine or tobacco. These products include cigarettes, chewing tobacco, and vaping devices, such as e-cigarettes. If you need help quitting, ask your provider. Contact a health care provider if: You have pain that does not get better with rest or medicine. You have new pain. You have a fever. You lose weight quickly. You have trouble doing your normal activities. You feel weak or numb in one or both of your legs or feet. Get help right away if: You are not able to control when you pee or poop. You have severe back pain and: Nausea or vomiting. Pain in your chest or abdomen. Shortness of breath. You faint. These symptoms may be an emergency. Get help right away. Call 911. Do not wait to see if the symptoms will go away. Do not drive yourself to the hospital. This information is not intended to replace advice given to you by your health care provider. Make sure you discuss any questions you have with your health care provider. Document Revised: 10/22/2021 Document Reviewed: 10/22/2021 Elsevier Patient Education  2024 [employer].

## 2024-01-14 NOTE — Patient Outreach (Addendum)
 Complex Care Management   Visit Note  01/14/2024  Name:  Alexandria Leach MRN: [id number] DOB: 02/16/1951  Situation: Referral received for Complex Care Management related to Heart Failure and Fibromyalgia I obtained verbal consent from Patient.  Visit completed with Patient  on the phone  Background:   Past Medical History:  Diagnosis Date   Ankle fracture, left 02/19/2015   from a fall   Anxiety    Cataract 2014   corrected with surgery   CHF (congestive heart failure) (HCC)    CKD (chronic kidney disease) stage 3, GFR 30-59 ml/min [facility])    saw nephrologist Dr Juline   Community acquired pneumonia 11/29/2022   Multifocal s/p hospitalization 11/2022     COVID-19 09/21/2021   DDD (degenerative disc disease), cervical    Depression    Fibromyalgia    Glaucoma    s/p surgery, sees ophtho Q6 mo   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 Thought benign.    Osteoarthritis    shoulders and knees, not RA per Dr Dolphus, positive ANA, positive Ro   Osteopenia 09/18/2015   DEXA T -1.1 hip, -0.2 spine 08/2015    Personal history of urinary calculi latest 2014   Pneumonia 12/02/2011   PONV (postoperative nausea and vomiting)    Refusal of blood transfusions as patient is Jehovah's Witness    Rheumatic heart disease 1980   s/p mitral valve repair 1980   Sjogren's syndrome    Trimalleolar fracture of left ankle 02/23/2015    Assessment: Patient Reported Symptoms:  Cognitive Cognitive Status: No symptoms reported Cognitive/Intellectual Conditions Management [RPT]: None reported or documented in medical history or problem list    Health Maintenance Behaviors: Annual physical exam  Neurological Neurological Review of Symptoms: No symptoms reported Neurological Management Strategies: Medication therapy Neurological Comment: neuropathy, fibromyalgia  HEENT HEENT Symptoms Reported: No symptoms reported HEENT Management Strategies: Medication therapy, Routine screening    Cardiovascular Cardiovascular Symptoms Reported: No symptoms reported Does patient have uncontrolled Hypertension?: No Cardiovascular Management Strategies: Medication therapy, Routine screening Weight: 178 lb (80.7 kg) Cardiovascular Comment: no CHF sx - has CHF AP, BP 138/73  Respiratory Respiratory Symptoms Reported: No symptoms reported Respiratory Management Strategies: Routine screening  Endocrine Endocrine Symptoms Reported: Not assessed Is patient diabetic?: No    Gastrointestinal Gastrointestinal Symptoms Reported: No symptoms reported Additional Gastrointestinal Details: difficulty eating with ongoing dental work - top teeth pulled, gums healing no sx of infection, top dentures pending, using protein shakes daily Gastrointestinal Management Strategies: Medication therapy    Genitourinary Genitourinary Symptoms Reported: No symptoms reported    Integumentary Integumentary Symptoms Reported: No symptoms reported Skin Management Strategies: Routine screening  Musculoskeletal Musculoskelatal Symptoms Reviewed: Back pain, Limited mobility, Muscle pain Additional Musculoskeletal Details: fibromyalgia, recent back pain flare and ED visit - norco completed, not using lidocaine  patich - little relirf, tylenol  helping, will see ortho, Declines Pain management and PT at this time. No sharp pains or radiatio into legs, no numbness Musculoskeletal Management Strategies: Medication therapy, Routine screening Falls in the past year?: No Number of falls in past year:  1 or less Was there an injury with Fall?: No Fall Risk Category Calculator: 0 Patient  Fall Risk Level: Low Fall Risk Patient at Risk for Falls Due to: Impaired mobility, Orthopedic patient Fall risk Follow up: Falls evaluation completed, Falls prevention discussed  Psychosocial Psychosocial Symptoms Reported: No symptoms reported Additional Psychological Details: Psychiatrist, seeing LCSW - declining outside counseling Behavioral Management Strategies: Medication therapy        01/14/2024    PHQ2-9 Depression Screening   Little interest or pleasure in doing things Not at all  Feeling down, depressed, or hopeless Not at all  PHQ-2 - Total Score 0  Trouble falling or staying asleep, or sleeping too much    Feeling tired or having little energy    Poor appetite or overeating     Feeling bad about yourself - or that you are a failure or have let yourself or your family down    Trouble concentrating on things, such as reading the newspaper or watching television    Moving or speaking so slowly that other people could have noticed.  Or the opposite - being so fidgety or restless that you have been moving around a lot more than usual    Thoughts that you would be better off dead, or hurting yourself in some way    PHQ2-9 Total Score    If you checked off any problems, how difficult have these problems made it for you to do your work, take care of things at home, or get along with other people    Depression Interventions/Treatment      Vitals:   01/14/24 1147  BP: 138/73    Medications Reviewed Today     Reviewed by Devra Lands, RN (Registered Nurse) on 01/14/24 at 1145  Med List Status: <None>   Medication Order Taking? Sig Documenting Provider Last Dose Status Informant  acetaminophen  (TYLENOL ) 500 MG tablet [id number] Yes Take 1 tablet (500 mg total) by mouth daily. With 2nd dose as needed for pain Rilla Baller, MD  Active   ALPRAZolam  (XANAX ) 0.5 MG tablet [id number] Yes Take 1 tablet (0.5 mg total) by mouth daily. Okey Barnie SAUNDERS, MD  Active   amoxicillin   (AMOXIL ) 500 MG tablet [id number] Yes Take 4 tablets 1 hour prior to dental work Pietro Redell RAMAN, MD  Active   antiseptic oral rinse KEENAN) LIQD [id number] Yes 15 mLs by Mouth Rinse route 2 (two) times daily as needed for dry mouth. [provider]  Active Self  apixaban  (ELIQUIS ) 5 MG TABS tablet [id number] Yes TAKE 1 TABLET BY MOUTH TWICE  DAILY Crenshaw, Redell RAMAN, MD  Active   cholecalciferol  (VITAMIN D ) 1000 UNITS tablet [id number] Yes Take 1,000 Units by mouth daily. [provider]  Active Self  cyanocobalamin  (VITAMIN B12) 1000 MCG tablet [id number] Yes Take 1 tablet (1,000 mcg total) by mouth once a week. Rilla Baller, MD  Active Self           Med Note (CARD, AMY LITTIE   [place] Nov 29, 2022  9:38 PM) Emily on Friday.  diltiazem  (CARDIZEM  CD) 360 MG 24 hr capsule [id number] Yes TAKE 1 CAPSULE BY MOUTH DAILY Crenshaw, Redell RAMAN, MD  Active   docusate sodium  (COLACE) 100 MG capsule [id number] Yes Take 100 mg by mouth daily. [provider]  Active Self  dofetilide  (TIKOSYN ) 250 MCG capsule [id number] Yes TAKE 1 CAPSULE BY MOUTH TWICE  DAILY Crenshaw, Redell RAMAN, MD  Active   Evolocumab  (REPATHA  SURECLICK) 140 MG/ML  SOAJ [id number] Yes Inject 140 mg into the skin every 14 (fourteen) days. Pietro Redell RAMAN, MD  Active   ezetimibe  (ZETIA ) 10 MG tablet [id number] Yes Take 1 tablet (10 mg total) by mouth at bedtime. Rilla Baller, MD  Active   furosemide  (LASIX ) 20 MG tablet [id number] Yes Take 2 tablets (40 mg total) by mouth daily. Pietro Redell RAMAN, MD  Active   gabapentin  (NEURONTIN ) 300 MG capsule [id number] Yes TAKE 2 CAPSULES BY MOUTH IN THE MORNING 1 CAPSULE BY  MOUTH IN THE AFTERNOON AND  2 CAPSULES BY MOUTH AT  BEDTIME Rilla Baller, MD  Active   HYDROcodone -acetaminophen  (NORCO/VICODIN) 5-325 MG tablet [id number]  Take 1 tablet by mouth every 6 (six) hours as needed for moderate pain (pain score 4-6).  Patient not taking: Reported on 01/14/2024   Rilla Baller, MD   Active   lidocaine  (LIDODERM ) 5 % [id number] Yes Place 1 patch onto the skin daily. Remove & Discard patch within 12 hours or as directed by MD Glendia, Rocky SAILOR, PA-C  Active   Lifitegrast  (XIIDRA ) 5 % SOLN [id number] Yes Place 1 drop into both eyes 2 (two) times daily.   Active   loratadine  (CLARITIN ) 10 MG tablet [id number] Yes Take 1 tablet (10 mg total) by mouth daily. Rilla Baller, MD  Active Self  losartan  (COZAAR ) 100 MG tablet [id number] Yes Take 0.5 tablets (50 mg total) by mouth daily. Rilla Baller, MD  Active   magnesium  oxide (MAG-OX) 400 (240 Mg) MG tablet [id number] Yes Take 1 tablet (400 mg total) by mouth daily. Rilla Baller, MD  Active   metoprolol  tartrate (LOPRESSOR ) 50 MG tablet [id number] Yes TAKE 1 AND 1/2 TABLETS BY MOUTH  TWICE DAILY Pietro Redell RAMAN, MD  Active   pantoprazole  (PROTONIX ) 40 MG tablet [id number] Yes TAKE 1 TABLET BY MOUTH DAILY Rilla Baller, MD  Active   potassium chloride  (KLOR-CON  M) 10 MEQ tablet [id number] Yes TAKE 1 TABLET BY MOUTH TWICE  DAILY Rilla Baller, MD  Active   thiamine  (VITAMIN B1) 100 MG tablet [id number] Yes Take 1 tablet (100 mg total) by mouth once a week. Rilla Baller, MD  Active Self           Med Note (CARD, AMY LITTIE   [place] Nov 29, 2022  9:41 PM) Emily on Mondays.  zolpidem  (AMBIEN ) 10 MG tablet [id number] Yes Take 1 tablet (10 mg total) by mouth at bedtime as needed for sleep. Okey Barnie SAUNDERS, MD  Active             Recommendation:   PCP Follow-up Continue Current Plan of Care Contact Medicaid Worker as discussed    Follow Up Plan:   Telephone follow-up in 1 month  Nestora Duos, MSN, RN [facility]  [facility], [facility] RN Care Manager Direct Dial: [phone] Fax: [phone]

## 2024-01-15 LAB — PARATHYROID HORMONE, INTACT (NO CA): PTH: 34 pg/mL (ref 16–77)

## 2024-01-17 LAB — VITAMIN B1: Vitamin B1 (Thiamine): 24 nmol/L (ref 8–30)

## 2024-01-21 ENCOUNTER — Encounter: Payer: Self-pay | Admitting: Family Medicine

## 2024-01-21 ENCOUNTER — Other Ambulatory Visit (HOSPITAL_COMMUNITY): Payer: Self-pay

## 2024-01-21 ENCOUNTER — Other Ambulatory Visit (INDEPENDENT_AMBULATORY_CARE_PROVIDER_SITE_OTHER)

## 2024-01-21 ENCOUNTER — Other Ambulatory Visit: Payer: Self-pay

## 2024-01-21 ENCOUNTER — Ambulatory Visit: Admitting: Family Medicine

## 2024-01-21 ENCOUNTER — Ambulatory Visit: Payer: Self-pay | Admitting: Family Medicine

## 2024-01-21 ENCOUNTER — Ambulatory Visit (INDEPENDENT_AMBULATORY_CARE_PROVIDER_SITE_OTHER): Admitting: Orthopedic Surgery

## 2024-01-21 VITALS — BP 165/82 | HR 92 | Ht 61.0 in | Wt 184.0 lb

## 2024-01-21 VITALS — BP 138/76 | HR 82 | Temp 98.3°F | Ht 61.0 in | Wt 186.0 lb

## 2024-01-21 DIAGNOSIS — E519 Thiamine deficiency, unspecified: Secondary | ICD-10-CM

## 2024-01-21 DIAGNOSIS — M545 Low back pain, unspecified: Secondary | ICD-10-CM

## 2024-01-21 DIAGNOSIS — Z789 Other specified health status: Secondary | ICD-10-CM

## 2024-01-21 DIAGNOSIS — I484 Atypical atrial flutter: Secondary | ICD-10-CM

## 2024-01-21 DIAGNOSIS — E538 Deficiency of other specified B group vitamins: Secondary | ICD-10-CM

## 2024-01-21 DIAGNOSIS — G8929 Other chronic pain: Secondary | ICD-10-CM

## 2024-01-21 DIAGNOSIS — K219 Gastro-esophageal reflux disease without esophagitis: Secondary | ICD-10-CM | POA: Diagnosis not present

## 2024-01-21 DIAGNOSIS — Z7189 Other specified counseling: Secondary | ICD-10-CM

## 2024-01-21 DIAGNOSIS — I10 Essential (primary) hypertension: Secondary | ICD-10-CM | POA: Diagnosis not present

## 2024-01-21 DIAGNOSIS — F331 Major depressive disorder, recurrent, moderate: Secondary | ICD-10-CM

## 2024-01-21 DIAGNOSIS — Z66 Do not resuscitate: Secondary | ICD-10-CM

## 2024-01-21 DIAGNOSIS — I5032 Chronic diastolic (congestive) heart failure: Secondary | ICD-10-CM

## 2024-01-21 DIAGNOSIS — G6289 Other specified polyneuropathies: Secondary | ICD-10-CM | POA: Diagnosis not present

## 2024-01-21 DIAGNOSIS — Z0000 Encounter for general adult medical examination without abnormal findings: Secondary | ICD-10-CM | POA: Diagnosis not present

## 2024-01-21 DIAGNOSIS — M85852 Other specified disorders of bone density and structure, left thigh: Secondary | ICD-10-CM

## 2024-01-21 DIAGNOSIS — R7303 Prediabetes: Secondary | ICD-10-CM

## 2024-01-21 DIAGNOSIS — E7800 Pure hypercholesterolemia, unspecified: Secondary | ICD-10-CM

## 2024-01-21 DIAGNOSIS — I350 Nonrheumatic aortic (valve) stenosis: Secondary | ICD-10-CM

## 2024-01-21 DIAGNOSIS — N1832 Chronic kidney disease, stage 3b: Secondary | ICD-10-CM

## 2024-01-21 DIAGNOSIS — M3501 Sicca syndrome with keratoconjunctivitis: Secondary | ICD-10-CM

## 2024-01-21 MED ORDER — EZETIMIBE 10 MG PO TABS
10.0000 mg | ORAL_TABLET | Freq: Every day | ORAL | 3 refills | Status: AC
  Filled 2024-01-21 – 2024-03-09 (×4): qty 100, 100d supply, fill #0

## 2024-01-21 MED ORDER — PANTOPRAZOLE SODIUM 40 MG PO TBEC
40.0000 mg | DELAYED_RELEASE_TABLET | Freq: Every day | ORAL | 0 refills | Status: DC
  Filled 2024-01-21 – 2024-03-16 (×4): qty 100, 100d supply, fill #0

## 2024-01-21 MED ORDER — GABAPENTIN 300 MG PO CAPS
ORAL_CAPSULE | ORAL | 3 refills | Status: AC
  Filled 2024-01-21: qty 500, 100d supply, fill #0
  Filled 2024-01-21: qty 500, fill #0
  Filled 2024-01-21 – 2024-03-19 (×4): qty 500, 100d supply, fill #0

## 2024-01-21 MED ORDER — METHYLPREDNISOLONE 4 MG PO TBPK
ORAL_TABLET | ORAL | 0 refills | Status: DC
  Filled 2024-01-21: qty 21, 6d supply, fill #0

## 2024-01-21 MED ORDER — LOSARTAN POTASSIUM 100 MG PO TABS
50.0000 mg | ORAL_TABLET | Freq: Every day | ORAL | 3 refills | Status: DC
  Filled 2024-01-21 – 2024-02-11 (×3): qty 50, 100d supply, fill #0

## 2024-01-21 NOTE — Assessment & Plan Note (Signed)
 Continue gabapentin.

## 2024-01-21 NOTE — Assessment & Plan Note (Signed)
 Continue dietary calcium regular walking, regular vit D supplementation.

## 2024-01-21 NOTE — Assessment & Plan Note (Addendum)
Chronic, overall stable. Continue current regimen.  °

## 2024-01-21 NOTE — Assessment & Plan Note (Signed)
Sees rheum.  

## 2024-01-21 NOTE — Assessment & Plan Note (Signed)

## 2024-01-21 NOTE — Assessment & Plan Note (Signed)
 Cointinue once weekly b12

## 2024-01-21 NOTE — Telephone Encounter (Signed)
 Reviewing chart, this patient had both EGD and colonoscopy at [facility] 08/2019 with pathology showing 3 TA polyps (pathology report is in media section).  I have requested records of colonoscopy report from [facility] which we don't have.  Will forward to GI as fyi as pt may need sooner colonoscopy.

## 2024-01-21 NOTE — Assessment & Plan Note (Signed)
 Pending ortho eval later today

## 2024-01-21 NOTE — Assessment & Plan Note (Signed)
 Reviewed healthy diet and lifestyle changes to effect sustainable weight loss.   Obesity complicated by comorbidities including CKD, HTN, prediabetes.

## 2024-01-21 NOTE — Assessment & Plan Note (Signed)
 Stable period on weekly replacement.

## 2024-01-21 NOTE — Assessment & Plan Note (Signed)
 Previously discussed.

## 2024-01-21 NOTE — Assessment & Plan Note (Addendum)
 Continue daily PPI.

## 2024-01-21 NOTE — Progress Notes (Signed)
 Orthopedic Spine Surgery Office Note  Assessment: Patient is a 73 y.o. female with low back pain. No radicular symptoms. Has significant facet arthrosis at L4/5 and L5/S1   Plan: -Explained that initially conservative treatment is tried as a significant number of patients may experience relief with these treatment modalities. Discussed that the conservative treatments include:  -activity modification  -physical therapy  -over the counter pain medications  -medrol  dosepak  -lumbar steroid injections -Patient has tried tylenol , gabapentin , ibuprofen   -Recommended diagnostic/therapeutic facet injections -Prescribed a medrol  dose pak for additional pain relief -Patient should return to office in 6 weeks, x-rays at next visit: none   Patient expressed understanding of the plan and all questions were answered to the patient's satisfaction.   ___________________________________________________________________________   History:  Patient is a 73 y.o. female who presents today for lumbar spine.  Patient has had about 2 months of low back pain.  There was no trauma or injury that preceded the onset of pain.  She does not have any pain radiating to either lower extremity.  She has been trying over-the-counter medications without any relief of her pain.  She rates the pain as an 8 out of 10 at its worst.   Weakness: Denies Symptoms of imbalance: Denies Paresthesias and numbness: Has numbness and paresthesias from neuropathy. Is on gabapentin . No recent changes in numbness or paresthesias Bowel or bladder incontinence: Denies Saddle anesthesia: Denies  Treatments tried: tylenol , gabapentin , ibuprofen   Review of systems: Denies fevers and chills, night sweats, unexplained weight loss, history of cancer.  Has had pain that wakes her at night  Past medical history: HTN Fibromyalgia CKD Anxiety Heart failure Atrial fibrillation Neuropathy Chronic pain History of DVT/PE Sjogren  syndrome  Allergies: duloxetine, trazodone , statins, sulfa  Past surgical history:  Left ankle fracture ORIF Mitral valve repair Hernia repair Cholecystectomy Cataract surgery Hysterectomy  Social history: Denies use of nicotine product (smoking, vaping, patches, smokeless) Alcohol  use: Denies Denies recreational drug use   Physical Exam:  BMI of 34.8  General: no acute distress, appears stated age Neurologic: alert, answering questions appropriately, following commands Respiratory: unlabored breathing on room air, symmetric chest rise Psychiatric: appropriate affect, normal cadence to speech   MSK (spine):  -Strength exam      Left  Right EHL    5/5  5/5 TA    5/5  5/5 GSC    5/5  5/5 Knee extension  5/5  5/5 Hip flexion   5/5  5/5  -Sensory exam    Sensation intact to light touch in L3-S1 nerve distributions of bilateral lower extremities  -Achilles DTR: 2/4 on the left, 2/4 on the right -Patellar tendon DTR: 2/4 on the left, 2/4 on the right  -Straight leg raise: Negative bilaterally -Clonus: no beats bilaterally  -Left hip exam: No pain through range of motion, negative Stinchfield, negative FABER -Right hip exam: No pain through range of motion, negative Stinchfield, negative FABER  Imaging: XRs of the lumbar spine from 01/21/2024 were independently reviewed and interpreted, showing disc height loss at L5/S1.  No evidence of instability on flexion/extension views.  No fracture or dislocation seen.  Lordotic alignment.  CT of the lumbar spine from 12/21/2023 was independently reviewed and interpreted, showing no fracture or dislocation seen.  Significant facet arthrosis at L4/5.  Moderate facet arthrosis at L5/S1.   Patient name: Alexandria Leach Patient MRN: [id number] Date of visit: 01/21/24

## 2024-01-21 NOTE — Telephone Encounter (Signed)
 Okay-if she had a colonoscopy showing 3 polyps in June 2021, unless those polyps were large she would not need to repeat for 5 years, June 2026.  We will need full colonoscopy report to clarify and confirm that date however.  Can you let me know if and when that comes in so I can review it?  Thanks.

## 2024-01-21 NOTE — Assessment & Plan Note (Signed)
Sees psychiatrist.  

## 2024-01-21 NOTE — Assessment & Plan Note (Signed)
 Chronic, stable period on ezetimibe , repatha 

## 2024-01-21 NOTE — Assessment & Plan Note (Signed)
 Appreciate nephrology care Janita)

## 2024-01-21 NOTE — Progress Notes (Addendum)
 Ph: [phone] Fax: [phone]   Patient ID: Alexandria Leach, female    DOB: 01/15/1951, 73 y.o.   MRN: [id number]  This visit was conducted in person.  BP 138/76   Pulse 82   Temp 98.3 F (36.8 C) (Oral)   Ht 5' 1 (1.549 m)   Wt 186 lb (84.4 kg)   SpO2 98%   BMI 35.14 kg/m    CC: CPE Subjective:   HPI: Alexandria Leach is a 73 y.o. female presenting on 01/21/2024 for Annual Exam   Did not see health advisor this year.  No results found.  Flowsheet Row Office Visit from 01/21/2024 in [facility] [place]  PHQ-2 Total Score 0       01/21/2024    8:20 AM 01/14/2024   11:51 AM 12/26/2023   11:13 AM 12/04/2023   10:08 AM 11/06/2023   10:11 AM  Fall Risk   Falls in the past year? 0 0 0 1 1  Number falls in past yr: 0 0 0 0 0  Injury with Fall? 0 0 0 0 0  Risk for fall due to : No Fall Risks Impaired mobility;Orthopedic patient No Fall Risks History of fall(s);Impaired mobility;Medication side effect;Impaired balance/gait History of fall(s);Impaired balance/gait;Impaired mobility;Medication side effect  Follow up Falls evaluation completed Falls evaluation completed;Falls prevention discussed Falls evaluation completed Falls evaluation completed;Falls prevention discussed Falls evaluation completed;Falls prevention discussed  Comment     Anticoagulent  - ED for head bumps   Hospitalization for multifocal pneumonia 11/2022 treated with IV rocephin /azithromycin  followed by oral augmentin .   Back pain is some better - but taking tylenol  regularly every morning. Back pain worsens as the day goes on. She has ortho appt later today    Atypical atrial flutter - followed by EP Dr Waddell and cardiology Dr Pietro on renally dosed dofetilide  (Tikosyn ) as well as cardizem , metoprolol  and eliquis .  Known chronic HFpEF with mitral stenosis/regurg s/p repair - rec SBE ppx.    Sjogren's with keratoconjunctivitis sicca (ANA+, Ro+) and fibromyalgia  followed by Dr Dolphus rheum.  Fibromyalgia - has worked with PT/OT, continues tylenol  and gabapentin  600/300/600mg  daily.   Lovastatin  stopped due to myalgias, now on Repatha  and zetia , tolerating well.    CKD - established with Dr Macel SERUM.    Preventative: COLONOSCOPY 07/2014; WNL Kristen)  COLONOSCOPY 08/2019 - 3 TAs, rpt 3 yrs ([facility])  Referred to [facility] - pt received letter saying she's not due unti l2026.  [facility]  Well woman exam - s/p hysterectomy, ovaries remain. No pelvic pain, vaginal bleeding. Pelvic exam normal 2017.  DEXA T -1.1 hip, -0.2 spine 08/2015  DEXA 07/2021: T score -1.1 left femoral neck-stable osteopenia, not at increased risk of bone fracture. Discussed calcium intake, vit D supplementation, and regular weight bearing exercise.  Lung cancer screening - not eligible  Flu shot - declines  COVID vaccine - Pfizer 04/2019, 05/2019, booster 12/2019, 01/2021, latest 12/2021, 12/2022 Tetanus shot - will check with insurance  Pneumovax 2014, prevnar-13 2019, pneumovax 12/2018, prevnar-20 discussed, declines Shingrix - discussed, declines  Advanced directive discussion - received and scanned 2017. Son Alexandria Leach is HCPOA then [place]. Clear she does not want CPR, no intubation. Has DNR form at home. Jehovah's witness - no blood products.  Seat belt use discussed Sunscreen use and skin screen discussed, no changing moles on skin Non smoker  Alcohol  - none  Dentist yearly  Eye  exam yearly  Bowel - no constipation  Bladder - no incontinence   No caffeine use Lives with son, 1 dog Occupation: unemployed, on disability for fibromyalgia since 2008. Edu: HS Religion: Jehova's witness Activity: some walking Diet: good water, fruits/vegetables some      Relevant past medical, surgical, family and social history reviewed and updated as indicated. Interim medical history since our last visit reviewed. Allergies and medications  reviewed and updated. Outpatient Medications Prior to Visit  Medication Sig Dispense Refill   acetaminophen  (TYLENOL ) 500 MG tablet Take 1 tablet (500 mg total) by mouth daily. With 2nd dose as needed for pain     ALPRAZolam  (XANAX ) 0.5 MG tablet Take 1 tablet (0.5 mg total) by mouth daily. 30 tablet 5   amoxicillin  (AMOXIL ) 500 MG tablet Take 4 tablets 1 hour prior to dental work 4 tablet 6   antiseptic oral rinse (BIOTENE) LIQD 15 mLs by Mouth Rinse route 2 (two) times daily as needed for dry mouth.     apixaban  (ELIQUIS ) 5 MG TABS tablet TAKE 1 TABLET BY MOUTH TWICE  DAILY 200 tablet 1   cholecalciferol  (VITAMIN D ) 1000 UNITS tablet Take 1,000 Units by mouth daily.     cyanocobalamin  (VITAMIN B12) 1000 MCG tablet Take 1 tablet (1,000 mcg total) by mouth once a week.     diltiazem  (CARDIZEM  CD) 360 MG 24 hr capsule TAKE 1 CAPSULE BY MOUTH DAILY 90 capsule 2   docusate sodium  (COLACE) 100 MG capsule Take 100 mg by mouth daily.     dofetilide  (TIKOSYN ) 250 MCG capsule TAKE 1 CAPSULE BY MOUTH TWICE  DAILY 200 capsule 3   Evolocumab  (REPATHA  SURECLICK) 140 MG/ML SOAJ Inject 140 mg into the skin every 14 (fourteen) days. 6 mL 3   furosemide  (LASIX ) 20 MG tablet Take 2 tablets (40 mg total) by mouth daily. 180 tablet 2   lidocaine  (LIDODERM ) 5 % Place 1 patch onto the skin daily. Remove & Discard patch within 12 hours or as directed by MD 30 patch 0   Lifitegrast  (XIIDRA ) 5 % SOLN Place 1 drop into both eyes 2 (two) times daily. 180 each 4   loratadine  (CLARITIN ) 10 MG tablet Take 1 tablet (10 mg total) by mouth daily. 90 tablet 3   magnesium  oxide (MAG-OX) 400 (240 Mg) MG tablet Take 1 tablet (400 mg total) by mouth daily.     metoprolol  tartrate (LOPRESSOR ) 50 MG tablet TAKE 1 AND 1/2 TABLETS BY MOUTH  TWICE DAILY 300 tablet 3   potassium chloride  (KLOR-CON  M) 10 MEQ tablet TAKE 1 TABLET BY MOUTH TWICE  DAILY 200 tablet 0   thiamine  (VITAMIN B1) 100 MG tablet Take 1 tablet (100 mg total) by mouth  once a week.     zolpidem  (AMBIEN ) 10 MG tablet Take 1 tablet (10 mg total) by mouth at bedtime as needed for sleep. 30 tablet 5   ezetimibe  (ZETIA ) 10 MG tablet Take 1 tablet (10 mg total) by mouth at bedtime. 100 tablet 3   gabapentin  (NEURONTIN ) 300 MG capsule TAKE 2 CAPSULES BY MOUTH IN THE MORNING 1 CAPSULE BY  MOUTH IN THE AFTERNOON AND  2 CAPSULES BY MOUTH AT  BEDTIME 500 capsule 3   HYDROcodone -acetaminophen  (NORCO/VICODIN) 5-325 MG tablet Take 1 tablet by mouth every 6 (six) hours as needed for moderate pain (pain score 4-6). 20 tablet 0   losartan  (COZAAR ) 100 MG tablet Take 0.5 tablets (50 mg total) by mouth daily. 50 tablet 3  pantoprazole  (PROTONIX ) 40 MG tablet TAKE 1 TABLET BY MOUTH DAILY 100 tablet 0   No facility-administered medications prior to visit.     Per HPI unless specifically indicated in ROS section below Review of Systems  Constitutional:  Negative for activity change, appetite change, chills, fatigue, fever and unexpected weight change.  HENT:  Negative for hearing loss.   Eyes:  Negative for visual disturbance.  Respiratory:  Negative for cough, chest tightness, shortness of breath and wheezing.   Cardiovascular:  Negative for chest pain, palpitations and leg swelling.  Gastrointestinal:  Negative for abdominal distention, abdominal pain, blood in stool, constipation, diarrhea, nausea and vomiting.  Genitourinary:  Negative for difficulty urinating and hematuria.  Musculoskeletal:  Positive for arthralgias, back pain and myalgias. Negative for neck pain.  Skin:  Negative for rash.  Neurological:  Negative for dizziness, seizures, syncope and headaches.  Hematological:  Negative for adenopathy. Does not bruise/bleed easily.  Psychiatric/Behavioral:  Negative for dysphoric mood. The patient is not nervous/anxious.     Objective:  BP 138/76   Pulse 82   Temp 98.3 F (36.8 C) (Oral)   Ht 5' 1 (1.549 m)   Wt 186 lb (84.4 kg)   SpO2 98%   BMI 35.14 kg/m    Wt Readings from Last 3 Encounters:  01/21/24 186 lb (84.4 kg)  01/14/24 178 lb (80.7 kg)  12/26/23 184 lb (83.5 kg)      Physical Exam Vitals and nursing note reviewed.  Constitutional:      Appearance: Normal appearance. She is not ill-appearing.  HENT:     Head: Normocephalic and atraumatic.     Right Ear: Tympanic membrane, ear canal and external ear normal. There is no impacted cerumen.     Left Ear: Tympanic membrane, ear canal and external ear normal. There is no impacted cerumen.     Mouth/Throat:     Comments: Wearing mask Eyes:     General:        Right eye: No discharge.        Left eye: No discharge.     Extraocular Movements: Extraocular movements intact.     Conjunctiva/sclera: Conjunctivae normal.     Pupils: Pupils are equal, round, and reactive to light.  Neck:     Thyroid : No thyroid  mass or thyromegaly.     Vascular: Carotid bruit present.  Cardiovascular:     Rate and Rhythm: Normal rate and regular rhythm.     Pulses: Normal pulses.     Heart sounds: Murmur (3/6 systolic) heard.  Pulmonary:     Effort: Pulmonary effort is normal. No respiratory distress.     Breath sounds: Normal breath sounds. No wheezing, rhonchi or rales.  Abdominal:     General: Bowel sounds are normal. There is no distension.     Palpations: Abdomen is soft. There is no mass.     Tenderness: There is no abdominal tenderness. There is no guarding or rebound.     Hernia: No hernia is present.  Musculoskeletal:     Cervical back: Normal range of motion and neck supple. No rigidity.     Right lower leg: No edema.     Left lower leg: No edema.  Lymphadenopathy:     Cervical: No cervical adenopathy.  Skin:    General: Skin is warm and dry.     Findings: No rash.  Neurological:     General: No focal deficit present.     Mental Status: She is alert. Mental  status is at baseline.     Comments:  Recall 3/3 Calculation 5/5 DLROW  Psychiatric:        Mood and Affect: Mood normal.         Behavior: Behavior normal.        Results for orders placed or performed in visit on 01/14/24  Comprehensive metabolic panel with GFR   Collection Time: 01/14/24  7:24 AM  Result Value Ref Range   Sodium 140 135 - 145 mEq/L   Potassium 4.3 3.5 - 5.1 mEq/L   Chloride 107 96 - 112 mEq/L   CO2 24 19 - 32 mEq/L   Glucose, Bld 122 (H) 70 - 99 mg/dL   BUN 14 6 - 23 mg/dL   Creatinine, Ser 8.74 (H) 0.40 - 1.20 mg/dL   Total Bilirubin 0.4 0.2 - 1.2 mg/dL   Alkaline Phosphatase 62 39 - 117 U/L   AST 25 0 - 37 U/L   ALT 26 0 - 35 U/L   Total Protein 7.2 6.0 - 8.3 g/dL   Albumin 4.3 3.5 - 5.2 g/dL   GFR 56.61 (L) >39.99 mL/min   Calcium 9.8 8.4 - 10.5 mg/dL  Lipid panel   Collection Time: 01/14/24  7:24 AM  Result Value Ref Range   Cholesterol 134 0 - 200 mg/dL   Triglycerides 08.9 0.0 - 149.0 mg/dL   HDL 33.79 >60.99 mg/dL   VLDL 81.7 0.0 - 59.9 mg/dL   LDL Cholesterol 50 0 - 99 mg/dL   Total CHOL/HDL Ratio 2    NonHDL 67.75   TSH   Collection Time: 01/14/24  7:24 AM  Result Value Ref Range   TSH 3.21 0.35 - 5.50 uIU/mL  T4, free   Collection Time: 01/14/24  7:24 AM  Result Value Ref Range   Free T4 0.68 0.60 - 1.60 ng/dL  Phosphorus   Collection Time: 01/14/24  7:24 AM  Result Value Ref Range   Phosphorus 3.2 2.3 - 4.6 mg/dL  VITAMIN D  25 Hydroxy (Vit-D Deficiency, Fractures)   Collection Time: 01/14/24  7:24 AM  Result Value Ref Range   VITD 40.47 30.00 - 100.00 ng/mL  Parathyroid  hormone, intact (no Ca)   Collection Time: 01/14/24  7:24 AM  Result Value Ref Range   PTH 34 16 - 77 pg/mL  CBC with Differential/Platelet   Collection Time: 01/14/24  7:24 AM  Result Value Ref Range   WBC 4.9 4.0 - 10.5 K/uL   RBC 4.51 3.87 - 5.11 Mil/uL   Hemoglobin 12.4 12.0 - 15.0 g/dL   HCT 61.6 63.9 - 53.9 %   MCV 84.9 78.0 - 100.0 fl   MCHC 32.3 30.0 - 36.0 g/dL   RDW 85.0 88.4 - 84.4 %   Platelets 233.0 150.0 - 400.0 K/uL   Neutrophils Relative % 57.2 43.0 - 77.0 %    Lymphocytes Relative 29.5 12.0 - 46.0 %   Monocytes Relative 9.5 3.0 - 12.0 %   Eosinophils Relative 3.3 0.0 - 5.0 %   Basophils Relative 0.5 0.0 - 3.0 %   Neutro Abs 2.8 1.4 - 7.7 K/uL   Lymphs Abs 1.4 0.7 - 4.0 K/uL   Monocytes Absolute 0.5 0.1 - 1.0 K/uL   Eosinophils Absolute 0.2 0.0 - 0.7 K/uL   Basophils Absolute 0.0 0.0 - 0.1 K/uL  Vitamin B12   Collection Time: 01/14/24  7:24 AM  Result Value Ref Range   Vitamin B-12 559 211 - 911 pg/mL  Hemoglobin A1c   Collection  Time: 01/14/24  7:24 AM  Result Value Ref Range   Hgb A1c MFr Bld 6.2 4.6 - 6.5 %  Microalbumin / creatinine urine ratio   Collection Time: 01/14/24  7:27 AM  Result Value Ref Range   Microalb, Ur <0.7 mg/dL   Creatinine,U 865.7 mg/dL   Microalb Creat Ratio Unable to calculate 0.0 - 30.0 mg/g  Vitamin B1   Collection Time: 01/14/24  7:27 AM  Result Value Ref Range   Vitamin B1 (Thiamine ) 24 8 - 30 nmol/L   *Note: Due to a large number of results and/or encounters for the requested time period, some results have not been displayed. A complete set of results can be found in Results Review.    Assessment & Plan:   Problem List Items Addressed This Visit     Refusal of blood product (Chronic)   Health maintenance examination (Chronic)   Preventative protocols reviewed and updated unless pt declined. Discussed healthy diet and lifestyle.       Advanced care planning/counseling discussion (Chronic)   Previously discussed      Medicare annual wellness visit, subsequent - Primary (Chronic)   I have personally reviewed the Medicare Annual Wellness questionnaire and have noted 1. The patient's medical and social history 2. Their use of alcohol , tobacco or illicit drugs 3. Their current medications and supplements 4. The patient's functional ability including ADL's, fall risks, home safety risks and hearing or visual impairment. Cognitive function has been assessed and addressed as indicated.  5. Diet and  physical activity 6. Evidence for depression or mood disorders The patients weight, height, BMI have been recorded in the chart. I have made referrals, counseling and provided education to the patient based on review of the above and I have provided the pt with a written personalized care plan for preventive services. Provider list updated.. See scanned questionairre as needed for further documentation. Reviewed preventative protocols and updated unless pt declined.       DNR no code (do not resuscitate) (Chronic)   HYPERCHOLESTEROLEMIA   Chronic, stable period on ezetimibe , repatha       Relevant Medications   ezetimibe  (ZETIA ) 10 MG tablet   losartan  (COZAAR ) 100 MG tablet   MDD (major depressive disorder), recurrent episode   Sees psychiatrist.       Essential hypertension   Chronic, overall stable. Continue current regimen.       Relevant Medications   ezetimibe  (ZETIA ) 10 MG tablet   losartan  (COZAAR ) 100 MG tablet   Aortic stenosis   Regularly sees cardiology.       Relevant Medications   ezetimibe  (ZETIA ) 10 MG tablet   losartan  (COZAAR ) 100 MG tablet   (HFpEF) heart failure with preserved ejection fraction [facility])   Appreciate cardiology care.       Relevant Medications   ezetimibe  (ZETIA ) 10 MG tablet   losartan  (COZAAR ) 100 MG tablet   Sjogren's syndrome   Sees rheum      CKD (chronic kidney disease) stage 3, GFR 30-59 ml/min (HCC)   Appreciate nephrology care Janita)      Osteopenia   Continue dietary calcium regular walking, regular vit D supplementation.       Peripheral neuropathy   Continue gabapentin       Relevant Medications   gabapentin  (NEURONTIN ) 300 MG capsule   Atrial flutter (HCC)   Continues eliquis , tikosyn , dilt and metoprolol        Relevant Medications   ezetimibe  (ZETIA ) 10 MG tablet   losartan  (COZAAR )  100 MG tablet   Prediabetes   Encouraged limiting added sugar in diet.       Low serum vitamin B12   Cointinue once  weekly b12       Vitamin B1 deficiency   Stable period on weekly replacement.       Chronic GERD   Continue daily PPI      Relevant Medications   pantoprazole  (PROTONIX ) 40 MG tablet   Severe obesity (BMI 35.0-39.9) with comorbidity (HCC)   Reviewed healthy diet and lifestyle changes to effect sustainable weight loss.   Obesity complicated by comorbidities including CKD, HTN, prediabetes.       Chronic midline low back pain without sciatica   Pending ortho eval later today       Relevant Medications   gabapentin  (NEURONTIN ) 300 MG capsule     Meds ordered this encounter  Medications   ezetimibe  (ZETIA ) 10 MG tablet    Sig: Take 1 tablet (10 mg total) by mouth at bedtime.    Dispense:  100 tablet    Refill:  3   gabapentin  (NEURONTIN ) 300 MG capsule    Sig: Take 2 capsules (600 mg total) by mouth every morning AND 1 capsule (300 mg total) daily in the afternoon AND 2 capsules (600 mg total) at bedtime.    Dispense:  500 capsule    Refill:  3   losartan  (COZAAR ) 100 MG tablet    Sig: Take 1/2 tablet (50 mg total) by mouth daily.    Dispense:  50 tablet    Refill:  3   pantoprazole  (PROTONIX ) 40 MG tablet    Sig: Take 1 tablet (40 mg total) by mouth daily.    Dispense:  100 tablet    Refill:  0    No orders of the defined types were placed in this encounter.   Patient Instructions  We will request latest colonoscopy from [facility] 08/2019. I will send a note to [facility] about repeating colonoscopy sooner. Good to see you today  Return as needed or in 6 months for follow up visit.   Follow up plan: Return in about 6 months (around 07/20/2024) for follow up visit.  Anton Blas, MD

## 2024-01-21 NOTE — Assessment & Plan Note (Signed)
Appreciate cardiology care.  °

## 2024-01-21 NOTE — Assessment & Plan Note (Signed)
Encouraged limiting added sugar in diet.  

## 2024-01-21 NOTE — Assessment & Plan Note (Signed)
 Continues eliquis , tikosyn , dilt and metoprolol 

## 2024-01-21 NOTE — Patient Instructions (Addendum)
 We will request latest colonoscopy from [facility] 08/2019. I will send a note to [facility] about repeating colonoscopy sooner. Good to see you today  Return as needed or in 6 months for follow up visit.

## 2024-01-21 NOTE — Progress Notes (Signed)
 Colonoscopy report from [facility] requested via epic.

## 2024-01-21 NOTE — Assessment & Plan Note (Addendum)
Regularly sees cardiology 

## 2024-01-21 NOTE — Assessment & Plan Note (Signed)
 Preventative protocols reviewed and updated unless pt declined. Discussed healthy diet and lifestyle.

## 2024-01-22 ENCOUNTER — Other Ambulatory Visit (HOSPITAL_COMMUNITY): Payer: Self-pay

## 2024-01-28 ENCOUNTER — Telehealth: Payer: Self-pay | Admitting: Physical Medicine and Rehabilitation

## 2024-01-28 NOTE — Telephone Encounter (Signed)
 Patient called and said she wants to cancel the appointment. CB#[phone]

## 2024-01-28 NOTE — Progress Notes (Unsigned)
 Office Visit Note  Patient: Alexandria Leach             Date of Birth: 10/23/1950           MRN: [id number]             PCP: Rilla Baller, MD Referring: Rilla Baller, MD Visit Date: 02/11/2024 Occupation: Data Unavailable  Subjective:  Generalized pain   History of Present Illness: Alexandria Leach is a 73 y.o. female with history of sjogren's syndrome, osteoarthritis, and fibromyalgia.  She is not taking any immunosuppressive agents at this time..  Patient was previously taking Plaquenil  but discontinued in 2021.  Patient presents today with increased generalized pain.  She denies any joint swelling at this time.  She is been taking Tylenol  as needed for pain relief but her symptoms have been inadequately controlled.  She is no longer taking tramadol  due to GI side effects.  She does remain on gabapentin  as prescribed.  She continues to have chronic sicca symptoms and has been using over-the-counter products for symptomatic relief.  She continues to see the ophthalmologist on a yearly basis.  She denies any new or worsening pulmonary symptoms.    Component     Latest Ref Rng 09/11/2023  Total Protein     6.1 - 8.1 g/dL 7.9   Albumin ELP     3.8 - 4.8 g/dL 4.5   Alpha 1     0.2 - 0.3 g/dL 0.3   Alpha 2     0.5 - 0.9 g/dL 0.8   Beta Globulin     0.4 - 0.6 g/dL 0.5   Beta 2     0.2 - 0.5 g/dL 0.5   Gamma Globulin     0.8 - 1.7 g/dL 1.3   SPE Interp. -   ANA Titer 1     titer 1:80 (H)   ANA Pattern 1 Nuclear, Speckled !   ANA TITER     titer 1:320 (H)   ANA PATTERN Nuclear, Homogeneous !   C3 Complement     83 - 193 mg/dL 834   C4 Complement     15 - 57 mg/dL 51   ds DNA Ab     IU/mL <1   Sed Rate     0 - 30 mm/h 28   SSA (Ro) (ENA) Antibody, IgG     <1.0 NEG AI >8.0 POS !   RA Latex Turbid.     <14 IU/mL <10     Activities of Daily Living:  Patient reports morning stiffness for half the day.   Patient Denies nocturnal pain.  Difficulty  dressing/grooming: Denies Difficulty climbing stairs: Reports Difficulty getting out of chair: Reports Difficulty using hands for taps, buttons, cutlery, and/or writing: Reports  Review of Systems  Constitutional:  Positive for fatigue.  HENT:  Positive for mouth dryness. Negative for mouth sores.   Eyes:  Positive for dryness.  Respiratory:  Negative for shortness of breath.   Cardiovascular:  Negative for chest pain and palpitations.  Gastrointestinal:  Negative for blood in stool, constipation and diarrhea.  Endocrine: Negative for increased urination.  Genitourinary:  Negative for involuntary urination.  Musculoskeletal:  Positive for joint pain, joint pain, joint swelling, myalgias, morning stiffness, muscle tenderness and myalgias. Negative for gait problem and muscle weakness.  Skin:  Positive for color change. Negative for rash, hair loss and sensitivity to sunlight.  Allergic/Immunologic: Negative for susceptible to infections.  Neurological:  Negative for dizziness and  headaches.  Hematological:  Negative for swollen glands.  Psychiatric/Behavioral:  Negative for depressed mood and sleep disturbance. The patient is not nervous/anxious.     PMFS History:  Patient Active Problem List   Diagnosis Date Noted   Chronic midline low back pain without sciatica 12/26/2023   Severe obesity (BMI 35.0-39.9) with comorbidity (HCC) 10/23/2023   Encounter for chronic pain management 10/22/2023   Chest wall pain 09/10/2023   Nausea 09/09/2023   Fall at home 07/21/2023   Myalgia due to statin 01/20/2023   Thrombophlebitis of left arm 12/18/2022   Leg pain, bilateral 01/11/2022   Chronic pain of right knee 07/11/2021   Chronic GERD 01/30/2021   History of adenomatous polyp of colon 01/30/2021   Vitamin B1 deficiency 01/18/2021   Low serum vitamin B12 01/13/2021   Secondary hypercoagulable state 10/23/2020   Prediabetes 07/11/2020   Snoring 06/23/2020   Atrial flutter (HCC)  06/23/2020   Dysphagia 05/15/2020   Secondary hyperparathyroidism of renal origin 12/28/2019   Constipation 12/28/2019   Sinus tachycardia 05/27/2018   Transient alteration of awareness 04/04/2018   Abnormal serum thyroid  stimulating hormone (TSH) level 01/30/2018   Subclavian artery disease 12/22/2017   Thyroid  nodule 09/16/2017   Sialoadenitis of submandibular gland 10/29/2016   High risk medication use 06/05/2016   Peripheral neuropathy 01/30/2016   DNR no code (do not resuscitate) 12/08/2015   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   TIA (transient ischemic attack) 10/12/2015   Osteopenia 09/18/2015   Right arm pain 08/29/2015   Right sided sciatica 02/21/2015   Imbalance 01/12/2015   Transaminitis 12/24/2014   DDD (degenerative disc disease), cervical    Health maintenance examination 10/18/2014   Advanced care planning/counseling discussion 10/18/2014   Medicare annual wellness visit, subsequent 10/18/2014   Refusal of blood product    Sjogren's syndrome    CKD (chronic kidney disease) stage 3, GFR 30-59 ml/min (HCC)    Glaucoma    Fibromyalgia    Osteoarthritis    History of pulmonary embolism    Lung nodule 09/22/2012   (HFpEF) heart failure with preserved ejection fraction (HCC) 12/04/2011   Aortic stenosis 04/22/2011   Palpitations 08/10/2010   HOT FLASHES 06/28/2008   GAD (generalized anxiety disorder) 03/28/2006   History of total abdominal hysterectomy 03/28/2006   HYPERCHOLESTEROLEMIA 12/31/2005   MDD (major depressive disorder), recurrent episode 12/31/2005   Essential hypertension 12/31/2005   Chronic insomnia 12/31/2005   History of mitral valve repair 1980    Past Medical History:  Diagnosis Date   Allergy 1999   SULFUR,STATIN,CYMBALTA   Ankle fracture, left 02/19/2015   from a fall   Anxiety    Cataract 2014   corrected with surgery   CHF (congestive heart failure) (HCC)    CKD (chronic kidney disease) stage 3, GFR 30-59  ml/min [facility])    saw nephrologist Dr Juline   Community acquired pneumonia 11/29/2022   Multifocal s/p hospitalization 11/2022     COVID-19 09/21/2021   DDD (degenerative disc disease), cervical    Depression    Fibromyalgia    GERD (gastroesophageal reflux disease)    Glaucoma    s/p surgery, sees ophtho Q6 mo   History of DVT (deep vein thrombosis) several times latest 2012   receives coumadin while hospitalized   History of kidney stones 2010   History of pulmonary embolism 2001, 2006   completed coumadin courses   History of rheumatic fever x3   HLD (hyperlipidemia)    HTN (hypertension)  Insomnia    Low serum vitamin B12 01/13/2021   Lung nodule 09/22/2012   RLL - 6mm, stable since 2014. Thought benign.    Osteoarthritis    shoulders and knees, not RA per Dr Dolphus, positive ANA, positive Ro   Osteopenia 09/18/2015   DEXA T -1.1 hip, -0.2 spine 08/2015    Personal history of urinary calculi latest 2014   Pneumonia 12/02/2011   PONV (postoperative nausea and vomiting)    Refusal of blood transfusions as patient is Jehovah's Witness    Rheumatic heart disease 1980   s/p mitral valve repair 1980   Sjogren's syndrome    Trimalleolar fracture of left ankle 02/23/2015    Family History  Problem Relation Age of Onset   Cancer Mother        lung (nonsmoker)   CAD Mother        MI in her 31s   ALS Mother    Kidney disease Father    Alcohol  abuse Father    Diabetes Father    Lupus Sister        and niece   Learning disabilities Sister    Diabetes Sister    Stroke Sister    Depression Sister    Lupus Sister    Anxiety disorder Sister    COPD Sister    Heart disease Sister    Hyperlipidemia Sister    Blindness Sister    Cancer Maternal Uncle        bone   Stroke Maternal Grandmother    Cancer Brother        bone   Diabetes Brother    Heart attack Brother    Arthritis Brother    Drug abuse Son    Kidney disease Son    Healthy Son    Healthy Son     Healthy Son    Kidney failure Other        on HD   Alcohol  abuse Sister    Hearing loss Sister    Anxiety disorder Sister    Vision loss Sister    Breast cancer Neg Hx    Past Surgical History:  Procedure Laterality Date   BREAST BIOPSY Right 2006   benign   CARDIOVERSION N/A 07/18/2020   Procedure: CARDIOVERSION;  Surgeon: Loni Soyla LABOR, MD;  Location: [facility];  Service: Cardiovascular;  Laterality: N/A;   CHOLECYSTECTOMY  11/27/2011   Procedure: LAPAROSCOPIC CHOLECYSTECTOMY WITH INTRAOPERATIVE CHOLANGIOGRAM;  Surgeon: Elon CHRISTELLA Pacini, MD;  Location: [facility] OR;  Service: General;  Laterality: N/A;  laparoscopic cholecystectomy with choleangiogram umbilical hernia repair   COLONOSCOPY  07/2014   WNL [facility])   COLONOSCOPY  08/2019   3 TAs, rpt 3 yrs ([place])   dexa  08/2012   normal per patient - no records available   ESOPHAGOGASTRODUODENOSCOPY  08/2019   reactive gastropathy, neg H pylori ([place])   EYE SURGERY Bilateral 2014   cataract removal   FRACTURE SURGERY  2016   HERNIA REPAIR  2012   MITRAL VALVE REPAIR  1980   open heart   ORIF ANKLE FRACTURE Left 02/26/2015   Procedure: OPEN REDUCTION INTERNAL FIXATION (ORIF) LEFT TRIMALLEOLAR ANKLE FRACTURE;  Surgeon: Kay CHRISTELLA Cummins, MD;  Location: [facility] OR;  Service: Orthopedics;  Laterality: Left;   TUBAL LIGATION  1980   UMBILICAL HERNIA REPAIR  11/27/2011   Procedure: HERNIA REPAIR UMBILICAL ADULT;  Surgeon: Elon CHRISTELLA Pacini, MD;  Location[facility] OR;  Service: General;  Laterality: N/A;   VAGINAL HYSTERECTOMY  1992   for fibroids -- partial, ovaries remain   Social History   Tobacco Use   Smoking status: Never    Passive exposure: Past   Smokeless tobacco: Never   Tobacco comments:    NEVER SMOKE  Vaping Use   Vaping status: Never Used  Substance Use Topics   Alcohol  use: No    Alcohol /week: 0.0 standard drinks of alcohol    Drug use: No   Social History   Social History Narrative   Lives with son, 1 dog    Occupation: unemployed, on disability for fibromyalgia since 2008.   Edu: HS   Religion: Jehova's witness   Activity: volunteers at senior center   Diet: some water, fruits/vegetables daily   No caffeine use     Immunization History  Administered Date(s) Administered   PFIZER(Purple Top)SARS-COV-2 Vaccination 05/12/2019, 06/09/2019, 01/06/2020   Pfizer Covid-19 Vaccine Bivalent Booster 86yrs & up 02/13/2021   Pfizer(Comirnaty)Fall Seasonal Vaccine 12 years and older 12/21/2021, 12/23/2022   Pneumococcal Conjugate-13 12/15/2017   Pneumococcal Polysaccharide-23 07/22/2012, 09/15/2012, 12/22/2018   Td (Adult),unspecified 11/27/2011     Objective: Vital Signs: BP 131/75   Pulse 73   Temp 97.6 F (36.4 C)   Resp 14   Ht 5' 1 (1.549 m)   Wt 189 lb 3.2 oz (85.8 kg)   BMI 35.75 kg/m    Physical Exam Vitals and nursing note reviewed.  Constitutional:      Appearance: She is well-developed.  HENT:     Head: Normocephalic and atraumatic.  Eyes:     Conjunctiva/sclera: Conjunctivae normal.  Cardiovascular:     Rate and Rhythm: Normal rate and regular rhythm.     Heart sounds: Murmur heard.  Pulmonary:     Effort: Pulmonary effort is normal.     Breath sounds: Normal breath sounds.  Abdominal:     General: Bowel sounds are normal.     Palpations: Abdomen is soft.  Musculoskeletal:     Cervical back: Normal range of motion.  Lymphadenopathy:     Cervical: No cervical adenopathy.  Skin:    General: Skin is warm and dry.     Capillary Refill: Capillary refill takes less than 2 seconds.  Neurological:     Mental Status: She is alert and oriented to person, place, and time.  Psychiatric:        Behavior: Behavior normal.      Musculoskeletal Exam: Generalized hyperalgesia and positive tender points on exam.  C-spine has slightly limited range of motion without rotation.  Limited mobility of the lumbar spine.  No midline spinal tenderness.  No SI joint tenderness.   Shoulder joints, elbow joints, wrist joints, MCPs, PIPs, DIPs have good range of motion with no synovitis.  Complete fist formation bilaterally.  PIP and DIP thickening consistent with osteoarthritis of both hands.  Hip joints have good range of motion with no groin pain.  Knee joints have good range of motion no warmth or effusion.  Ankle joints have good range of motion no tenderness or joint swelling.  No evidence of Achilles tendinitis or plantar fasciitis.   CDAI Exam: CDAI Score: -- Patient Global: --; Provider Global: -- Swollen: --; Tender: -- Joint Exam 02/11/2024   No joint exam has been documented for this visit   There is currently no information documented on the homunculus. Go to the Rheumatology activity and complete the homunculus joint exam.  Investigation: No additional findings.  Imaging: XR Lumbar Spine Complete Result Date: 01/21/2024 XRs of  the lumbar spine from 01/21/2024 were independently reviewed and interpreted, showing disc height loss at L5/S1.  No evidence of instability on flexion/extension views.  No fracture or dislocation seen.  Lordotic alignment.   Recent Labs: Lab Results  Component Value Date   WBC 4.9 01/14/2024   HGB 12.4 01/14/2024   PLT 233.0 01/14/2024   NA 140 01/14/2024   K 4.3 01/14/2024   CL 107 01/14/2024   CO2 24 01/14/2024   GLUCOSE 122 (H) 01/14/2024   BUN 14 01/14/2024   CREATININE 1.25 (H) 01/14/2024   BILITOT 0.4 01/14/2024   ALKPHOS 62 01/14/2024   AST 25 01/14/2024   ALT 26 01/14/2024   PROT 7.2 01/14/2024   ALBUMIN 4.3 01/14/2024   CALCIUM 9.8 01/14/2024   GFRAA 51 (L) 11/11/2019    Speciality Comments: PLQ Eye Exam: 08/03/2018 WNL @ [facility] Follow up in 1 year  Procedures:  No procedures performed Allergies: Cymbalta [duloxetine hcl], Trazodone , Statins, and Sulfa antibiotics   Assessment / Plan:     Visit Diagnoses: Sjogren's syndrome with keratoconjunctivitis sicca - +ANA, +Ro: Patient continues to  have chronic sicca symptoms and has been using over-the-counter products for symptomatic relief.  She remains under the care of ophthalmology and is seeing the dentist every 6 months.  No new or worsening pulmonary symptoms.  Lungs were clear to auscultation. Discussed the increased risk for developing lymphoma in patients with Sjogren syndrome. Lab work from 09/11/2023 was reviewed today in the office: ANA 1: 80 NS, 1: 320 NH, complements within normal limits, double-stranded DNA negative, ESR 28, SPEP normal, Ro antibody greater than 8, RF negative.  Plan to update the following lab work today for further evaluation. Do not plan to resume Plaquenil  due to concurrent use of Tikosyn . Discussed that an FDA approved medications for Sjogren's will be coming available soon and we can further discuss in the future if needed. She will follow-up in the office in 5 months or sooner if needed. - Plan: CBC with Differential/Platelet, Urinalysis, Routine w reflex microscopic, ANA, C3 and C4, Sedimentation rate, Comprehensive metabolic panel with GFR, Serum protein electrophoresis with reflex, Rheumatoid factor, Sjogrens syndrome-A extractable nuclear antibody  High risk medication use - Discontinued PLQ in Feb 2021.  She is not taking any immunosuppressive agents at this time.  Lab work from 09/11/2023 was reviewed today in the office: ANA 1: 80 NS, 1: 320 NH, complements within normal limits, double-stranded DNA negative, ESR 28, SPEP normal, Ro antibody greater than 8, RF negative.  - Plan: CBC with Differential/Platelet, Comprehensive metabolic panel with GFR  Primary osteoarthritis of both hands: PIP and DIP thickening consistent with osteoarthritis of both hands.  No synovitis noted.  Complete fist formation bilaterally.  Primary osteoarthritis of right knee - X-rays from April 2023 which were consistent with osteoarthritis.  Previous cortisone injections.  Fibromyalgia: She has generalized hyperalgesia and  positive tender points on exam.  She presents today experiencing a flare of fibromyalgia.  She is having generalized pain and has been taking Tylenol  for symptomatic relief.  She remains on gabapentin  as prescribed.  She had to discontinue tramadol  in the past due to GI side effects. Plan to place a referral to pain management as requested.  DDD (degenerative disc disease), cervical: C-spine has limited range of motion without rotation.  No symptoms of radiculopathy.  Degeneration of intervertebral disc of lumbar region without discogenic back pain or lower extremity pain: Chronic pain.  Limited mobility.  No symptoms of  radiculopathy at this time.  Plan to refer the patient to pain management as requested.  Osteopenia of multiple sites - Jul 16, 2021 DEXA scan T score of -1.1 and BMD 0.880 in the left femoral neck.  She is taking vitamin D  1000 units daily.  Other chronic pain - Plan to place referral to pain management.   Other fatigue: Chronic, exacerbated during fibromyalgia flares.  Other insomnia - She is taking Ambien  10 mg at bedtime for insomnia.  Other medical conditions are listed as follows:  History of hypertension: Blood pressure was 131/75 today in the office.  Stage 3a chronic kidney disease (HCC) - Evaluated by Dr. Macel.  Mildly progressive CKD with no proteinuria.  Typical atrial flutter (HCC)  History of hypercholesterolemia  History of pulmonary embolism  Chronic anticoagulation  Thyroid  nodule  History of anxiety  Orders: Orders Placed This Encounter  Procedures   CBC with Differential/Platelet   Urinalysis, Routine w reflex microscopic   ANA   C3 and C4   Sedimentation rate   Comprehensive metabolic panel with GFR   Serum protein electrophoresis with reflex   Rheumatoid factor   Sjogrens syndrome-A extractable nuclear antibody   No orders of the defined types were placed in this encounter.    Follow-Up Instructions: Return in about 5 months  (around 07/11/2024) for Sjogren's syndrome.   Waddell CHRISTELLA Craze, PA-C  Note - This record has been created using Dragon software.  Chart creation errors have been sought, but may not always  have been located. Such creation errors do not reflect on  the standard of medical care.

## 2024-01-30 ENCOUNTER — Other Ambulatory Visit: Payer: Self-pay | Admitting: Licensed Clinical Social Worker

## 2024-01-30 NOTE — Patient Instructions (Signed)
 Visit Information  Thank you for taking time to visit with me today. Please don't hesitate to contact me if I can be of assistance to you before our next scheduled appointment.  Our next appointment is by telephone on 02/16/24  at 11am. Please call the care guide team at [phone] if you need to cancel or reschedule your appointment.   Following is a copy of your care plan:   Goals Addressed             This Visit's Progress    [facility] Social Work Care Plan LCSW       Problems:   Mental Health Concerns :Anxiety  CSW Clinical Goal(s):   Over the next 90 days the Patient will demonstrate a reduction in symptoms related to Anxiety with Social Anxiety, as evidence by reduction in anxiety symptoms and medical adherence.   Interventions:  Mental Health:  Evaluation of current treatment plan related to Anxiety with Social Anxiety, Active listening / Reflection utilized Depression screen reviewed Emotional Support Provided PHQ2/PHQ9 completed Discussed previous home health aid agency Encouraged to find new home health agency Discussed coping skills in social situations Discussed enrolling in therapy services- patient declined Reviewed mental health medications and discussed importance of compliance: xanax  Discussed following up with [facility] agency to begin services Discussed son coming in to town and lowering her bed to assist  Update PCA form  Patient Goals/Self-Care Activities:  Continue taking your medication as prescribed.   Continue with psychiatry provider. Find new home health aid  Plan:   Telephone follow up appointment with care management team member scheduled for:  02/16/24 at 10am.         Please call the Suicide and Crisis Lifeline: 988 call the [place]  National Suicide Prevention Lifeline: (419)300-1016 or TTY: 417-857-5334 TTY 3521140810) to talk to a trained counselor call 1-800-273-TALK (toll free, 24 hour hotline) call 911 if you are experiencing  a Mental Health or Behavioral Health Crisis or need someone to talk to.  Patient verbalized understanding of Care plan and visit instructions communicated this visit  Cena Ligas, LCSW Clinical Social Worker [facility] [employer]

## 2024-01-30 NOTE — Patient Outreach (Signed)
 Complex Care Management   Visit Note  01/30/2024  Name:  Alexandria Leach MRN: [id number] DOB: 06/09/1950  Situation: Referral received for Complex Care Management related to Mental/Behavioral Health diagnosis. I obtained verbal consent from Patient.  Visit completed with Patient  on the phone.  Background:   Past Medical History:  Diagnosis Date   Allergy 1999   SULFUR,STATIN,CYMBALTA   Ankle fracture, left 02/19/2015   from a fall   Anxiety    Cataract 2014   corrected with surgery   CHF (congestive heart failure) (HCC)    CKD (chronic kidney disease) stage 3, GFR 30-59 ml/min [facility])    saw nephrologist Dr Juline   Community acquired pneumonia 11/29/2022   Multifocal s/p hospitalization 11/2022     COVID-19 09/21/2021   DDD (degenerative disc disease), cervical    Depression    Fibromyalgia    GERD (gastroesophageal reflux disease)    Glaucoma    s/p surgery, sees ophtho Q6 mo   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 Thought benign.    Osteoarthritis    shoulders and knees, not RA per Dr Dolphus, positive ANA, positive Ro   Osteopenia 09/18/2015   DEXA T -1.1 hip, -0.2 spine 08/2015    Personal history of urinary calculi latest 2014   Pneumonia 12/02/2011   PONV (postoperative nausea and vomiting)    Refusal of blood transfusions as patient is Jehovah's Witness    Rheumatic heart disease 1980   s/p mitral valve repair 1980   Sjogren's syndrome    Trimalleolar fracture of left ankle 02/23/2015    Assessment: LCSW spoke to patient on the phone. Patient reports she is feeling better now that her sons health is  improving. LCSW inquired about PCA services. Patient was informed that since it has ben 30 days without services she will need a new form faxed in. LCSW will complete top part of form and sent to  MD. LCSW will fax when returned.  Patient Reported Symptoms:  Cognitive Cognitive Status: Alert and oriented to person, place, and time, Normal speech and language skills Cognitive/Intellectual Conditions Management [RPT]: None reported or documented in medical history or problem list   Health Maintenance Behaviors: Annual physical exam  Neurological Neurological Review of Symptoms: No symptoms reported    HEENT HEENT Symptoms Reported: No symptoms reported      Cardiovascular Cardiovascular Symptoms Reported: No symptoms reported    Respiratory Respiratory Symptoms Reported: No symptoms reported    Endocrine Endocrine Symptoms Reported: No symptoms reported    Gastrointestinal Gastrointestinal Symptoms Reported: No symptoms reported      Genitourinary Genitourinary Symptoms Reported: No symptoms reported    Integumentary Integumentary Symptoms Reported: No symptoms reported    Musculoskeletal Musculoskelatal Symptoms Reviewed: Back pain Additional Musculoskeletal Details: The patient went to orthopedics and was recommended to complete a 31-day course of prednisone , but the patient is concerned about potential fluid retention. The patient was also recommended to receive an epidural injection but is declining at this time.        Psychosocial Psychosocial Symptoms Reported: No symptoms reported  Additional Psychological Details: Pattient reports she is filling better since her sons health is improving.          01/30/2024    PHQ2-9 Depression Screening   Little interest or pleasure in doing things Not at all  Feeling down, depressed, or hopeless Not at all  PHQ-2 - Total Score 0  Trouble falling or staying asleep, or sleeping too much    Feeling tired or having little energy     Poor appetite or overeating     Feeling bad about yourself - or that you are a failure or have let yourself or your family down    Trouble concentrating on things, such as reading the newspaper or watching television    Moving or speaking so slowly that other people could have noticed.  Or the opposite - being so fidgety or restless that you have been moving around a lot more than usual    Thoughts that you would be better off dead, or hurting yourself in some way    PHQ2-9 Total Score    If you checked off any problems, how difficult have these problems made it for you to do your work, take care of things at home, or get along with other people    Depression Interventions/Treatment      There were no vitals filed for this visit.    Medications Reviewed Today     Reviewed by Veva Bolt, LCSW (Social Worker) on 01/30/24 at 1012  Med List Status: <None>   Medication Order Taking? Sig Documenting Provider Last Dose Status Informant  acetaminophen  (TYLENOL ) 500 MG tablet [id number]  Take 1 tablet (500 mg total) by mouth daily. With 2nd dose as needed for pain Rilla Baller, MD  Active   ALPRAZolam  (XANAX ) 0.5 MG tablet [id number]  Take 1 tablet (0.5 mg total) by mouth daily. Okey Barnie SAUNDERS, MD  Active   amoxicillin  (AMOXIL ) 500 MG tablet [id number]  Take 4 tablets 1 hour prior to dental work Pietro Redell RAMAN, MD  Active   antiseptic oral rinse KEENAN) LIQD [id number]  15 mLs by Mouth Rinse route 2 (two) times daily as needed for dry mouth. [provider]  Active Self  apixaban  (ELIQUIS ) 5 MG TABS tablet [id number]  TAKE 1 TABLET BY MOUTH TWICE  DAILY Crenshaw, Redell RAMAN, MD  Active   cholecalciferol  (VITAMIN D ) 1000 UNITS tablet [id number]  Take 1,000 Units by mouth daily. [provider]  Active Self  cyanocobalamin  (VITAMIN B12) 1000 MCG tablet [id number]  Take 1 tablet (1,000 mcg total) by mouth once a week. Rilla Baller, MD  Active Self           Med Note (CARD,  AMY LITTIE   Kerman Nov 29, 2022  9:38 PM) Emily on Friday.  diltiazem  (CARDIZEM  CD) 360 MG 24 hr capsule [id number]  TAKE 1 CAPSULE BY MOUTH DAILY Crenshaw, Redell RAMAN, MD  Active   docusate sodium  (COLACE) 100 MG capsule [id number]  Take 100 mg by mouth daily. [provider]  Active Self  dofetilide  (TIKOSYN ) 250 MCG capsule [id number]  TAKE 1 CAPSULE BY MOUTH TWICE  DAILY Crenshaw, Redell RAMAN, MD  Active   Evolocumab  (REPATHA  SURECLICK) 140 MG/ML SOAJ [id number]  Inject 140 mg into the skin every 14 (fourteen) days. Pietro Redell RAMAN, MD  Active   ezetimibe  (ZETIA ) 10 MG tablet [id number]  Take 1 tablet (10 mg total) by mouth at bedtime. Rilla Baller, MD  Active   furosemide  (LASIX ) 20 MG  tablet [id number]  Take 2 tablets (40 mg total) by mouth daily. Pietro Redell RAMAN, MD  Active   gabapentin  (NEURONTIN ) 300 MG capsule [id number]  Take 2 capsules (600 mg total) by mouth every morning AND 1 capsule (300 mg total) daily in the afternoon AND 2 capsules (600 mg total) at bedtime. Rilla Baller, MD  Active   lidocaine  (LIDODERM ) 5 % [id number]  Place 1 patch onto the skin daily. Remove & Discard patch within 12 hours or as directed by MD Glendia, Rocky SAILOR, PA-C  Active   Lifitegrast  (XIIDRA ) 5 % SOLN [id number]  Place 1 drop into both eyes 2 (two) times daily.   Active   loratadine  (CLARITIN ) 10 MG tablet [id number]  Take 1 tablet (10 mg total) by mouth daily. Rilla Baller, MD  Active Self  losartan  (COZAAR ) 100 MG tablet [id number]  Take 1/2 tablet (50 mg total) by mouth daily. Rilla Baller, MD  Active   magnesium  oxide (MAG-OX) 400 (240 Mg) MG tablet [id number]  Take 1 tablet (400 mg total) by mouth daily. Rilla Baller, MD  Active   methylPREDNISolone  (MEDROL  DOSEPAK) 4 MG TBPK tablet [id number]  Take as prescribed on the box  Patient not taking: Reported on 01/30/2024   Georgina Ozell LABOR, MD  Active   metoprolol  tartrate (LOPRESSOR ) 50 MG tablet [id number]  TAKE 1 AND 1/2 TABLETS BY MOUTH  TWICE  DAILY Pietro Redell RAMAN, MD  Active   pantoprazole  (PROTONIX ) 40 MG tablet [id number]  Take 1 tablet (40 mg total) by mouth daily. Rilla Baller, MD  Active   potassium chloride  (KLOR-CON  M) 10 MEQ tablet [id number]  TAKE 1 TABLET BY MOUTH TWICE  DAILY Rilla Baller, MD  Active   thiamine  (VITAMIN B1) 100 MG tablet [id number]  Take 1 tablet (100 mg total) by mouth once a week. Rilla Baller, MD  Active Self           Med Note (CARD, AMY LITTIE   Kerman Nov 29, 2022  9:41 PM) Emily on Mondays.  zolpidem  (AMBIEN ) 10 MG tablet [id number]  Take 1 tablet (10 mg total) by mouth at bedtime as needed for sleep. Okey Barnie SAUNDERS, MD  Active             Recommendation:   PCP Follow-up Continue Current Plan of Care  Follow Up Plan:   Telephone follow up appointment date/time:  02/16/24 at 11am.  Cena Ligas, LCSW Clinical Social Worker [facility]

## 2024-02-09 ENCOUNTER — Other Ambulatory Visit: Payer: Self-pay

## 2024-02-09 NOTE — Patient Instructions (Signed)
 Alexandria Leach - I am sorry I was unable to reach you today for our scheduled appointment. I work with Rilla Baller, MD and am calling to support your healthcare needs. Please contact me at [phone] at your earliest convenience. I look forward to speaking with you soon.   Thank you,  Nestora Duos, MSN, RN [facility]  [facility], [facility] RN Care Manager Direct Dial: [phone] Fax: [phone]

## 2024-02-09 NOTE — Patient Instructions (Signed)
 Visit Information  Thank you for taking time to visit with me today. Please don't hesitate to contact me if I can be of assistance to you before our next scheduled appointment.  Your next care management appointment is by telephone on 03/08/2024 at 9:00 am  Telephone follow-up in 1 month  Please call the care guide team at [phone] if you need to cancel, schedule, or reschedule an appointment.   Please call the Suicide and Crisis Lifeline: 988 call the [place]  National Suicide Prevention Lifeline: 902-465-0753 or TTY: 785-252-7695 TTY 249-038-0480) to talk to a trained counselor call 1-800-273-TALK (toll free, 24 hour hotline) go to [facility] [address] [place] [phone]) call 911 if you are experiencing a Mental Health or Behavioral Health Crisis or need someone to talk to.  Nestora Duos, MSN, RN [facility], [facility] RN Care Manager Direct Dial: [phone] Fax: [phone]   Energy Conservation Techniques  Sit for as many activities as possible. Use slow, smooth movements.  Rushing increases discomfort. Determine the necessity of performing the task.  Simplify those tasks that are necessary.  (Get clothes out of the dryer when they are warm instead of ironing, let dishes air dry, etc.) Take frequent rests both during and between activities.  Avoid repetitive tasks. Pre-plan your activities; try a daily and/or weekly schedule.  Spread out the activities that are most fatiguing (break up cleaning tasks over multiple days). Remember to plan a balance of work, rest and recreation. Consider the best time for each activity.  Do the most exertive task when you have the most energy. Don't carry items if you can push them.  Slide, don't lift. Push, don't pull. Utilize two hands when appropriate. Maintain good posture and use proper body mechanics. Avoid remaining in one position for too  long. When lifting, bend at the knees, not at the waist.  Exhale when bending down, inhale when straightening up. Carry objects as close to your body and as near to the center of the pelvis.  11. Avoid wasted body movements (position yourself for the task so that you avoid bending, twisting, etc.                when possible). 12. Select the best working environment.  Consider lighting, ventilation, clothing, and equipment. 13. Organize your storage areas, making the items you use daily convenient.  Store heaviest items at waist            height.  Store frequently used items between shoulders and knee height.  Consider leaving frequently used       items on countertops.  (You can organize in storage baskets based on time used/purpose). 14. Feelings and emotions can be real causes of fatigue.  Try to avoid unnecessary worry, irritation, or                    frustration.  Avoid stress, it can also be a source of fatigue. 15. Get help from other people for difficult tasks. 16. Explore equipment or items that may be able to do the job for you with greater ease.  (Electric can        openers, blenders, lightweight items for cleaning, etc.)

## 2024-02-09 NOTE — Patient Outreach (Signed)
 Complex Care Management   Visit Note  02/09/2024  Name:  Alexandria Leach MRN: [id number] DOB: 08-Nov-1950  Situation: Referral received for Complex Care Management related to Heart Failure and Fibromyalgia I obtained verbal consent from Patient.  Visit completed with Patient  on the phone  Background:   Past Medical History:  Diagnosis Date   Allergy 1999   SULFUR,STATIN,CYMBALTA   Ankle fracture, left 02/19/2015   from a fall   Anxiety    Cataract 2014   corrected with surgery   CHF (congestive heart failure) (HCC)    CKD (chronic kidney disease) stage 3, GFR 30-59 ml/min [facility])    saw nephrologist Dr Juline   Community acquired pneumonia 11/29/2022   Multifocal s/p hospitalization 11/2022     COVID-19 09/21/2021   DDD (degenerative disc disease), cervical    Depression    Fibromyalgia    GERD (gastroesophageal reflux disease)    Glaucoma    s/p surgery, sees ophtho Q6 mo   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 Thought benign.    Osteoarthritis    shoulders and knees, not RA per Dr Dolphus, positive ANA, positive Ro   Osteopenia 09/18/2015   DEXA T -1.1 hip, -0.2 spine 08/2015    Personal history of urinary calculi latest 2014   Pneumonia 12/02/2011   PONV (postoperative nausea and vomiting)    Refusal of blood transfusions as patient is Jehovah's Witness    Rheumatic heart disease 1980   s/p mitral valve repair 1980   Sjogren's syndrome    Trimalleolar fracture of left ankle 02/23/2015    Assessment: Patient Reported Symptoms:  Cognitive Cognitive Status: No symptoms reported Cognitive/Intellectual  Conditions Management [RPT]: None reported or documented in medical history or problem list   Health Maintenance Behaviors: Annual physical exam  Neurological Neurological Review of Symptoms: No symptoms reported Neurological Management Strategies: Medication therapy Neurological Comment: neuropathy and fibromyalgia - gabapentin   HEENT   HEENT Management Strategies: Medication therapy    Cardiovascular Cardiovascular Symptoms Reported: Chest pain or discomfort Does patient have uncontrolled Hypertension?: No Cardiovascular Management Strategies: Medication therapy, Routine screening Weight: 186 lb (84.4 kg) Cardiovascular Comment: reports at times pushes self too hard and will have some brief chest discomfort - no other sx relieved with rest - believes when she is very stressed, no CHF sx - states watching salt intake, no A flutter, weight stable per patient, BP today 131/76, sending Energy Conservation via MyChart and discussion over importance  Respiratory Respiratory Symptoms Reported: No symptoms reported Other Respiratory Symptoms: discussed respiratory illness when to call provider and red flags for ED - proactive encouraged, repors wears mask out of home    Endocrine Endocrine Symptoms Reported: Not assessed Is patient diabetic?: No    Gastrointestinal Gastrointestinal Symptoms Reported: No symptoms reported Additional Gastrointestinal Details: reports limiting salt, appetite good, soft diet, healthy choices, dentures in January, protein shakes, gums completly healed Gastrointestinal Management Strategies: Medication therapy    Genitourinary Genitourinary Symptoms Reported: No symptoms reported    Integumentary Integumentary Symptoms Reported: Rash Additional Integumentary Details: Rheumatology Wednesday - will  discuss light discoloration, lupus rash on arms and face Skin Management Strategies: Routine screening  Musculoskeletal Musculoskelatal Symptoms Reviewed: Back  pain Additional Musculoskeletal Details: declined steroids due to heart issues - tylenol  to manage back pain with improvement, no falls or near falls using cane, gentle stretching Musculoskeletal Management Strategies: Medication therapy, Routine screening Falls in the past year?: No Number of falls in past year: 1 or less Was there an injury with Fall?: No Fall Risk Category Calculator: 0 Patient Fall Risk Level: Low Fall Risk Patient at Risk for Falls Due to: Orthopedic patient, Impaired mobility Fall risk Follow up: Falls evaluation completed, Falls prevention discussed  Psychosocial Psychosocial Symptoms Reported: No symptoms reported Behavioral Management Strategies: Medication therapy Behavioral Health Comment: sees LCSW, Psychiatrist q 6 mos Techniques to Cope with Loss/Stress/Change: Medication      02/09/2024    PHQ2-9 Depression Screening   Little interest or pleasure in doing things Not at all  Feeling down, depressed, or hopeless Not at all  PHQ-2 - Total Score 0  Trouble falling or staying asleep, or sleeping too much    Feeling tired or having little energy    Poor appetite or overeating     Feeling bad about yourself - or that you are a failure or have let yourself or your family down    Trouble concentrating on things, such as reading the newspaper or watching television    Moving or speaking so slowly that other people could have noticed.  Or the opposite - being so fidgety or restless that you have been moving around a lot more than usual    Thoughts that you would be better off dead, or hurting yourself in some way    PHQ2-9 Total Score    If you checked off any problems, how difficult have these problems made it for you to do your work, take care of things at home, or get along with other people    Depression Interventions/Treatment      Today's Vitals   02/09/24 1018  BP: 131/76  Weight: 186 lb (84.4 kg)      Medications Reviewed Today     Reviewed by  Devra Lands, RN (Registered Nurse) on 02/09/24 at 1015  Med List Status: <None>   Medication Order Taking? Sig Documenting Provider Last Dose Status Informant  acetaminophen  (TYLENOL ) 500 MG tablet [id number] Yes Take 1 tablet (500 mg total) by mouth daily. With 2nd dose as needed for pain Rilla Baller, MD  Active   ALPRAZolam  (XANAX ) 0.5 MG tablet [id number] Yes Take 1 tablet (0.5 mg total) by mouth daily. Okey Barnie SAUNDERS, MD  Active   amoxicillin  (AMOXIL ) 500 MG tablet [id number] Yes Take 4 tablets 1 hour prior to dental work Pietro Redell RAMAN, MD  Active   antiseptic oral rinse KEENAN) LIQD [id number] Yes 15 mLs by Mouth Rinse route 2 (two) times daily as needed for dry mouth. [provider]  Active Self  apixaban  (ELIQUIS ) 5 MG TABS tablet [id number] Yes TAKE 1 TABLET BY MOUTH TWICE  DAILY Crenshaw, Redell RAMAN, MD  Active   cholecalciferol  (VITAMIN D ) 1000 UNITS tablet [id number] Yes Take 1,000 Units by mouth daily. [provider]  Active Self  cyanocobalamin  (VITAMIN B12) 1000 MCG tablet [id number] Yes Take 1 tablet (1,000 mcg total) by mouth once a week. Rilla Baller, MD  Active Self           Med Note (CARD, AMY LITTIE   Kerman Nov 29, 2022  9:38  PM) Takes on Friday.  diltiazem  (CARDIZEM  CD) 360 MG 24 hr capsule [id number] Yes TAKE 1 CAPSULE BY MOUTH DAILY Crenshaw, Redell RAMAN, MD  Active   docusate sodium  (COLACE) 100 MG capsule [id number] Yes Take 100 mg by mouth daily. [provider]  Active Self  dofetilide  (TIKOSYN ) 250 MCG capsule [id number] Yes TAKE 1 CAPSULE BY MOUTH TWICE  DAILY Crenshaw, Redell RAMAN, MD  Active   Evolocumab  (REPATHA  SURECLICK) 140 MG/ML SOAJ [id number] Yes Inject 140 mg into the skin every 14 (fourteen) days. Pietro Redell RAMAN, MD  Active   ezetimibe  (ZETIA ) 10 MG tablet [id number] Yes Take 1 tablet (10 mg total) by mouth at bedtime. Rilla Baller, MD  Active   furosemide  (LASIX ) 20 MG tablet [id number] Yes Take 2 tablets (40 mg total) by mouth  daily. Pietro Redell RAMAN, MD  Active   gabapentin  (NEURONTIN ) 300 MG capsule [id number] Yes Take 2 capsules (600 mg total) by mouth every morning AND 1 capsule (300 mg total) daily in the afternoon AND 2 capsules (600 mg total) at bedtime. Rilla Baller, MD  Active   lidocaine  (LIDODERM ) 5 % [id number]  Place 1 patch onto the skin daily. Remove & Discard patch within 12 hours or as directed by MD Glendia, Rocky SAILOR, PA-C  Active   Lifitegrast  (XIIDRA ) 5 % SOLN [id number] Yes Place 1 drop into both eyes 2 (two) times daily.   Active   loratadine  (CLARITIN ) 10 MG tablet [id number] Yes Take 1 tablet (10 mg total) by mouth daily. Rilla Baller, MD  Active Self  losartan  (COZAAR ) 100 MG tablet [id number] Yes Take 1/2 tablet (50 mg total) by mouth daily. Rilla Baller, MD  Active   magnesium  oxide (MAG-OX) 400 (240 Mg) MG tablet [id number] Yes Take 1 tablet (400 mg total) by mouth daily. Rilla Baller, MD  Active   methylPREDNISolone  (MEDROL  DOSEPAK) 4 MG TBPK tablet [id number]  Take as prescribed on the box  Patient not taking: Reported on 01/30/2024   Georgina Ozell LABOR, MD  Active   metoprolol  tartrate (LOPRESSOR ) 50 MG tablet [id number] Yes TAKE 1 AND 1/2 TABLETS BY MOUTH  TWICE DAILY Pietro Redell RAMAN, MD  Active   pantoprazole  (PROTONIX ) 40 MG tablet [id number] Yes Take 1 tablet (40 mg total) by mouth daily. Rilla Baller, MD  Active   potassium chloride  (KLOR-CON  M) 10 MEQ tablet [id number] Yes TAKE 1 TABLET BY MOUTH TWICE  DAILY Rilla Baller, MD  Active   thiamine  (VITAMIN B1) 100 MG tablet [id number] Yes Take 1 tablet (100 mg total) by mouth once a week. Rilla Baller, MD  Active Self           Med Note (CARD, AMY LITTIE   Kerman Nov 29, 2022  9:41 PM) Emily on Mondays.  zolpidem  (AMBIEN ) 10 MG tablet [id number] Yes Take 1 tablet (10 mg total) by mouth at bedtime as needed for sleep. Okey Barnie SAUNDERS, MD  Active             Recommendation:   PCP Follow-up Specialty provider follow-up  Rheumatology Continue Current Plan of Care  Follow Up Plan:   Telephone follow-up in 1 month  Nestora Duos, MSN, RN [facility]  [facility], [facility] RN Care Manager Direct Dial: [phone] Fax: [phone]

## 2024-02-11 ENCOUNTER — Other Ambulatory Visit: Payer: Self-pay

## 2024-02-11 ENCOUNTER — Ambulatory Visit: Attending: Physician Assistant | Admitting: Physician Assistant

## 2024-02-11 ENCOUNTER — Encounter: Payer: Self-pay | Admitting: Physician Assistant

## 2024-02-11 ENCOUNTER — Other Ambulatory Visit (HOSPITAL_COMMUNITY): Payer: Self-pay

## 2024-02-11 VITALS — BP 131/75 | HR 73 | Temp 97.6°F | Resp 14 | Ht 61.0 in | Wt 189.2 lb

## 2024-02-11 DIAGNOSIS — Z79899 Other long term (current) drug therapy: Secondary | ICD-10-CM

## 2024-02-11 DIAGNOSIS — M19041 Primary osteoarthritis, right hand: Secondary | ICD-10-CM | POA: Diagnosis not present

## 2024-02-11 DIAGNOSIS — N1831 Chronic kidney disease, stage 3a: Secondary | ICD-10-CM

## 2024-02-11 DIAGNOSIS — M19042 Primary osteoarthritis, left hand: Secondary | ICD-10-CM

## 2024-02-11 DIAGNOSIS — R5383 Other fatigue: Secondary | ICD-10-CM

## 2024-02-11 DIAGNOSIS — G8929 Other chronic pain: Secondary | ICD-10-CM

## 2024-02-11 DIAGNOSIS — M3501 Sicca syndrome with keratoconjunctivitis: Secondary | ICD-10-CM | POA: Diagnosis not present

## 2024-02-11 DIAGNOSIS — Z8679 Personal history of other diseases of the circulatory system: Secondary | ICD-10-CM

## 2024-02-11 DIAGNOSIS — M8589 Other specified disorders of bone density and structure, multiple sites: Secondary | ICD-10-CM

## 2024-02-11 DIAGNOSIS — M25571 Pain in right ankle and joints of right foot: Secondary | ICD-10-CM

## 2024-02-11 DIAGNOSIS — Z86711 Personal history of pulmonary embolism: Secondary | ICD-10-CM

## 2024-02-11 DIAGNOSIS — Z8659 Personal history of other mental and behavioral disorders: Secondary | ICD-10-CM

## 2024-02-11 DIAGNOSIS — M1711 Unilateral primary osteoarthritis, right knee: Secondary | ICD-10-CM | POA: Diagnosis not present

## 2024-02-11 DIAGNOSIS — M797 Fibromyalgia: Secondary | ICD-10-CM

## 2024-02-11 DIAGNOSIS — I483 Typical atrial flutter: Secondary | ICD-10-CM

## 2024-02-11 DIAGNOSIS — M5030 Other cervical disc degeneration, unspecified cervical region: Secondary | ICD-10-CM

## 2024-02-11 DIAGNOSIS — Z8639 Personal history of other endocrine, nutritional and metabolic disease: Secondary | ICD-10-CM

## 2024-02-11 DIAGNOSIS — G4709 Other insomnia: Secondary | ICD-10-CM

## 2024-02-11 DIAGNOSIS — Z7901 Long term (current) use of anticoagulants: Secondary | ICD-10-CM

## 2024-02-11 DIAGNOSIS — E041 Nontoxic single thyroid nodule: Secondary | ICD-10-CM

## 2024-02-11 DIAGNOSIS — M51369 Other intervertebral disc degeneration, lumbar region without mention of lumbar back pain or lower extremity pain: Secondary | ICD-10-CM

## 2024-02-11 NOTE — Progress Notes (Unsigned)
 Per Waddell Craze, place referral to pain management.

## 2024-02-14 ENCOUNTER — Other Ambulatory Visit: Payer: Self-pay | Admitting: Family Medicine

## 2024-02-14 ENCOUNTER — Other Ambulatory Visit: Payer: Self-pay | Admitting: Cardiology

## 2024-02-14 DIAGNOSIS — K219 Gastro-esophageal reflux disease without esophagitis: Secondary | ICD-10-CM

## 2024-02-14 DIAGNOSIS — G6289 Other specified polyneuropathies: Secondary | ICD-10-CM

## 2024-02-14 DIAGNOSIS — I4892 Unspecified atrial flutter: Secondary | ICD-10-CM

## 2024-02-14 DIAGNOSIS — E7800 Pure hypercholesterolemia, unspecified: Secondary | ICD-10-CM

## 2024-02-14 DIAGNOSIS — I10 Essential (primary) hypertension: Secondary | ICD-10-CM

## 2024-02-15 ENCOUNTER — Ambulatory Visit: Payer: Self-pay | Admitting: Physician Assistant

## 2024-02-15 LAB — URINALYSIS, ROUTINE W REFLEX MICROSCOPIC
Bacteria, UA: NONE SEEN /HPF
Bilirubin Urine: NEGATIVE
Glucose, UA: NEGATIVE
Hgb urine dipstick: NEGATIVE
Hyaline Cast: NONE SEEN /LPF
Nitrite: NEGATIVE
Protein, ur: NEGATIVE
Specific Gravity, Urine: 1.018 (ref 1.001–1.035)
pH: 6.5 (ref 5.0–8.0)

## 2024-02-15 LAB — ANTI-NUCLEAR AB-TITER (ANA TITER)
ANA TITER: 1:80 {titer} — ABNORMAL HIGH
ANA Titer 1: 1:80 {titer} — ABNORMAL HIGH

## 2024-02-15 LAB — COMPREHENSIVE METABOLIC PANEL WITH GFR
AG Ratio: 1.6 (calc) (ref 1.0–2.5)
ALT: 14 U/L (ref 6–29)
AST: 23 U/L (ref 10–35)
Albumin: 4.4 g/dL (ref 3.6–5.1)
Alkaline phosphatase (APISO): 61 U/L (ref 37–153)
BUN/Creatinine Ratio: 9 (calc) (ref 6–22)
BUN: 12 mg/dL (ref 7–25)
CO2: 22 mmol/L (ref 20–32)
Calcium: 9.8 mg/dL (ref 8.6–10.4)
Chloride: 110 mmol/L (ref 98–110)
Creat: 1.39 mg/dL — ABNORMAL HIGH (ref 0.60–1.00)
Globulin: 2.7 g/dL (ref 1.9–3.7)
Glucose, Bld: 155 mg/dL — ABNORMAL HIGH (ref 65–99)
Potassium: 3.9 mmol/L (ref 3.5–5.3)
Sodium: 138 mmol/L (ref 135–146)
Total Bilirubin: 0.4 mg/dL (ref 0.2–1.2)
Total Protein: 7.1 g/dL (ref 6.1–8.1)
eGFR: 41 mL/min/1.73m2 — ABNORMAL LOW (ref >=60)

## 2024-02-15 LAB — CBC WITH DIFFERENTIAL/PLATELET
Absolute Lymphocytes: 1531 {cells}/uL (ref 850–3900)
Absolute Monocytes: 374 {cells}/uL (ref 200–950)
Basophils Absolute: 38 {cells}/uL (ref 0–200)
Basophils Relative: 0.8 %
Eosinophils Absolute: 110 {cells}/uL (ref 15–500)
Eosinophils Relative: 2.3 %
HCT: 38 % (ref 35.9–46.0)
Hemoglobin: 12.1 g/dL (ref 11.7–15.5)
MCH: 27 pg (ref 27.0–33.0)
MCHC: 31.8 g/dL (ref 31.6–35.4)
MCV: 84.8 fL (ref 81.4–101.7)
MPV: 10.9 fL (ref 7.5–12.5)
Monocytes Relative: 7.8 %
Neutro Abs: 2746 {cells}/uL (ref 1500–7800)
Neutrophils Relative %: 57.2 %
Platelets: 254 Thousand/uL (ref 140–400)
RBC: 4.48 Million/uL (ref 3.80–5.10)
RDW: 13.6 % (ref 11.0–15.0)
Total Lymphocyte: 31.9 %
WBC: 4.8 Thousand/uL (ref 3.8–10.8)

## 2024-02-15 LAB — PROTEIN ELECTROPHORESIS, SERUM, WITH REFLEX
Albumin ELP: 4.2 g/dL (ref 3.8–4.8)
Alpha 1: 0.3 g/dL (ref 0.2–0.3)
Alpha 2: 0.7 g/dL (ref 0.5–0.9)
Beta 2: 0.5 g/dL (ref 0.2–0.5)
Beta Globulin: 0.4 g/dL (ref 0.4–0.6)
Gamma Globulin: 1.2 g/dL (ref 0.8–1.7)
Total Protein: 7.4 g/dL (ref 6.1–8.1)

## 2024-02-15 LAB — C3 AND C4
C3 Complement: 158 mg/dL (ref 83–193)
C4 Complement: 52 mg/dL (ref 15–57)

## 2024-02-15 LAB — ANA: Anti Nuclear Antibody (ANA): POSITIVE — AB

## 2024-02-15 LAB — MICROSCOPIC MESSAGE

## 2024-02-15 LAB — SEDIMENTATION RATE: Sed Rate: 25 mm/h (ref 0–30)

## 2024-02-15 LAB — RHEUMATOID FACTOR: Rheumatoid fact SerPl-aCnc: <10 [IU]/mL (ref <14)

## 2024-02-15 LAB — SJOGRENS SYNDROME-A EXTRACTABLE NUCLEAR ANTIBODY: SSA (Ro) (ENA) Antibody, IgG: >8 AI — AB

## 2024-02-15 NOTE — Progress Notes (Signed)
 ANA and Ro antibody remain positive.   UA revealed 2+ leukocytes and squamous cells-may not have been a clean specimen.   Glucose is 155. Creatinine is elevated-1.39 and GFR is low-41-avoid the use of NSAIDs. Rest of CMP WNL RF negative  CBC WNL Complements WNL ESR WNL SPEP normal

## 2024-02-16 ENCOUNTER — Other Ambulatory Visit: Payer: Self-pay | Admitting: Licensed Clinical Social Worker

## 2024-02-16 NOTE — Telephone Encounter (Signed)
 Potassium Last filled:  12/01/23, #200 Last OV:  01/21/24, annual exam Next OV:  07/20/24, 6 mo f/u

## 2024-02-16 NOTE — Telephone Encounter (Signed)
 Prescription refill request for Eliquis  received. Indication: A Flutter Last office visit: 11/19/23  C Fenton PA Scr: 1.39 on 02/11/24  Epic Age: 73 Weight: 86.5kg  Based on above findings Eliquis  5mg  twice daily is the appropriate dose.  Refill approved.

## 2024-02-17 ENCOUNTER — Encounter: Admitting: Physical Medicine and Rehabilitation

## 2024-02-17 NOTE — Patient Instructions (Signed)
 Visit Information  Thank you for taking time to visit with me today. Please don't hesitate to contact me if I can be of assistance to you before our next scheduled appointment.  Our next appointment is by telephone on 03/12/24 at 11am. Please call the care guide team at [phone] if you need to cancel or reschedule your appointment.   Following is a copy of your care plan:   Goals Addressed             This Visit's Progress    [facility] Social Work Care Plan LCSW       Problems:   Mental Health Concerns :Anxiety  CSW Clinical Goal(s):   Over the next 90 days the Patient will demonstrate a reduction in symptoms related to Anxiety with Social Anxiety, as evidence by reduction in anxiety symptoms and medical adherence.   Interventions:  Mental Health:  Evaluation of current treatment plan related to Anxiety with Social Anxiety, Active listening / Reflection utilized Depression screen reviewed Emotional Support Provided PHQ2/PHQ9 completed Discussed previous home health aid agency Encouraged to find new home health agency Discussed coping skills in social situations Discussed enrolling in therapy services- patient declined Reviewed mental health medications and discussed importance of compliance: xanax  Discussed following up with [facility] to begin services Discussed son coming in to town and lowering her bed to assist  Update PCA form  Patient Goals/Self-Care Activities:  Continue taking your medication as prescribed.   Continue with psychiatry provider. Find new home health aid  Plan:   Telephone follow up appointment with care management team member scheduled for:  03/12/24 at 11am..         Please call 911 if you are experiencing a Mental Health or Behavioral Health Crisis or need someone to talk to.  Patient verbalized understanding of Care plan and visit instructions communicated this visit  Cena Ligas, LCSW Clinical Social Worker [facility] [place]

## 2024-02-17 NOTE — Patient Outreach (Addendum)
 Complex Care Management   Visit Note  02/17/2024  Name:  Alexandria Leach MRN: [id number] DOB: [birth year]  Situation: Referral received for Complex Care Management related to Heart Failure and Fibromyalgia I obtained verbal consent from Patient.  Visit completed with Patient  on the phone.  Background:   Past Medical History:  Diagnosis Date   Allergy 1999   SULFUR,STATIN,CYMBALTA   Ankle fracture, left 02/19/2015   from a fall   Anxiety    Cataract 2014   corrected with surgery   CHF (congestive heart failure) (HCC)    CKD (chronic kidney disease) stage 3, GFR 30-59 ml/min [facility])    saw nephrologist Dr Juline   Community acquired pneumonia 11/29/2022   Multifocal s/p hospitalization 11/2022     COVID-19 09/21/2021   DDD (degenerative disc disease), cervical    Depression    Fibromyalgia    GERD (gastroesophageal reflux disease)    Glaucoma    s/p surgery, sees ophtho Q6 mo   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 Thought benign.    Osteoarthritis    shoulders and knees, not RA per Dr Dolphus, positive ANA, positive Ro   Osteopenia 09/18/2015   DEXA T -1.1 hip, -0.2 spine 08/2015    Personal history of urinary calculi latest 2014   Pneumonia 12/02/2011   PONV (postoperative nausea and vomiting)    Refusal of blood transfusions as patient is Jehovah's Witness    Rheumatic heart disease 1980   s/p mitral valve repair 1980   Sjogren's syndrome    Trimalleolar fracture of left ankle 02/23/2015    Assessment: LCSW spoke with patient on the phone. Patient stated she is doing well and managing. Patient stated she is still  waiting to hear something back from the state about PCA services. Patient stated it usually takes 30 days and it has only been two weeks. Patient requested LCSW to call her back towards the end of the month.  Patient Reported Symptoms:  Cognitive Cognitive Status: No symptoms reported      Neurological Neurological Review of Symptoms: No symptoms reported    HEENT HEENT Symptoms Reported: No symptoms reported      Cardiovascular Cardiovascular Symptoms Reported: No symptoms reported    Respiratory Respiratory Symptoms Reported: No symptoms reported    Endocrine Endocrine Symptoms Reported: No symptoms reported    Gastrointestinal Gastrointestinal Symptoms Reported: No symptoms reported      Genitourinary Genitourinary Symptoms Reported: No symptoms reported    Integumentary Integumentary Symptoms Reported: No symptoms reported    Musculoskeletal Musculoskelatal Symptoms Reviewed: Back pain Additional Musculoskeletal Details: rheumatology pain, PCP is managing with medication Musculoskeletal Management Strategies: Medication therapy, Routine screening Musculoskeletal Self-Management Outcome: 3 (uncertain)      Psychosocial Psychosocial Symptoms Reported: No symptoms reported          02/17/2024    PHQ2-9 Depression Screening   Little interest or pleasure in doing things    Feeling down, depressed, or hopeless    PHQ-2 - Total Score    Trouble falling or staying asleep, or sleeping too much  Feeling tired or having little energy    Poor appetite or overeating     Feeling bad about yourself - or that you are a failure or have let yourself or your family down    Trouble concentrating on things, such as reading the newspaper or watching television    Moving or speaking so slowly that other people could have noticed.  Or the opposite - being so fidgety or restless that you have been moving around a lot more than usual    Thoughts that you would be better off dead, or  hurting yourself in some way    PHQ2-9 Total Score    If you checked off any problems, how difficult have these problems made it for you to do your work, take care of things at home, or get along with other people    Depression Interventions/Treatment      There were no vitals filed for this visit.    Medications Reviewed Today   Medications were not reviewed in this encounter     Recommendation:   PCP Follow-up Continue Current Plan of Care  Follow Up Plan:   Telephone follow up appointment date/time:  03/12/24 at 11am.  Cena Ligas, LCSW Clinical Social Worker [facility]

## 2024-02-19 ENCOUNTER — Encounter: Payer: Self-pay | Admitting: Family Medicine

## 2024-02-19 NOTE — Telephone Encounter (Addendum)
 We received colonoscopy report from [facility] and have scanned under procedure section of chart (09/01/2019).  Thanks!

## 2024-02-21 NOTE — Telephone Encounter (Signed)
 Got it, thanks - exam in 2021 showed 3 small polyps. However, prep was listed as FAIR. In this light, due to the prep the patient would be due for colonoscopy now.   POD A RN can you help coordinate colonoscopy for this patient, using double prep, in the [facility] if she is a candidate for the [facility] otherwise? Thanks

## 2024-02-23 NOTE — Telephone Encounter (Signed)
 Attempted to reach patient. No answer, phone eventually leads to busy signal.  Patient does take Eliquis , so will need office visit prior to scheduling any [facility] procedures.

## 2024-02-24 NOTE — Telephone Encounter (Signed)
 Patient has been scheduled for office appointment with [name], PA-C on 04/01/24 to discuss colonoscopy.

## 2024-03-01 ENCOUNTER — Telehealth: Admitting: Licensed Clinical Social Worker

## 2024-03-02 ENCOUNTER — Encounter: Payer: Self-pay | Admitting: Physical Medicine and Rehabilitation

## 2024-03-08 ENCOUNTER — Telehealth

## 2024-03-08 NOTE — Patient Instructions (Signed)
 Visit Information  Thank you for taking time to visit with me today. Please don't hesitate to contact me if I can be of assistance to you before our next scheduled appointment.  Your next care management appointment is by telephone on 04/19/2024 at 9:00 am  Telephone follow-up 6 Alexandria Leach as discussed  Please call the care guide team at [phone] if you need to cancel, schedule, or reschedule an appointment.   Please call the Suicide and Crisis Lifeline: 988 call the [place]  National Suicide Prevention Lifeline: 660-740-1785 or TTY: 602-734-0851 TTY (440) 137-7865) to talk to a trained counselor call 1-800-273-TALK (toll free, 24 hour hotline) go to [facility] [address] [place] [phone]) call 911 if you are experiencing a Mental Health or Behavioral Health Crisis or need someone to talk to.  Nestora Duos, MSN, RN [facility], [facility] RN Care Manager Direct Dial: [phone] Fax: [phone]

## 2024-03-08 NOTE — Patient Outreach (Signed)
 Complex Care Management   Visit Note  03/08/2024  Name:  Alexandria Leach MRN: [id number] DOB: [birth year]  Situation: Referral received for Complex Care Management related to Heart Failure and Fibromyalgia I obtained verbal consent from Patient.  Visit completed with Patient  on the phone  Background:   Past Medical History:  Diagnosis Date   Allergy 1999   SULFUR,STATIN,CYMBALTA   Ankle fracture, left 02/19/2015   from a fall   Anxiety    Cataract 2014   corrected with surgery   CHF (congestive heart failure) (HCC)    CKD (chronic kidney disease) stage 3, GFR 30-59 ml/min [facility])    saw nephrologist Dr Juline   Community acquired pneumonia 11/29/2022   Multifocal s/p hospitalization 11/2022     COVID-19 09/21/2021   DDD (degenerative disc disease), cervical    Depression    Fibromyalgia    GERD (gastroesophageal reflux disease)    Glaucoma    s/p surgery, sees ophtho Q6 mo   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 Thought benign.    Osteoarthritis    shoulders and knees, not RA per Dr Dolphus, positive ANA, positive Ro   Osteopenia 09/18/2015   DEXA T -1.1 hip, -0.2 spine 08/2015    Personal history of urinary calculi latest 2014   Pneumonia 12/02/2011   PONV (postoperative nausea and vomiting)    Refusal of blood transfusions as patient is Jehovah's Witness    Rheumatic heart disease 1980   s/p mitral valve repair 1980   Sjogren's syndrome    Trimalleolar fracture of left ankle 02/23/2015    Assessment: Patient Reported Symptoms:  Cognitive Cognitive Status: No symptoms reported Cognitive/Intellectual  Conditions Management [RPT]: None reported or documented in medical history or problem list   Health Maintenance Behaviors: Annual physical exam  Neurological Neurological Review of Symptoms: No symptoms reported Neurological Management Strategies: Medication therapy Neurological Comment: neuropathy and fibromyalgia - gabapentin  helps  HEENT HEENT Symptoms Reported: No symptoms reported HEENT Comment: eye drops helping dry eyes    Cardiovascular Cardiovascular Symptoms Reported: No symptoms reported Cardiovascular Management Strategies: Medication therapy, Routine screening Weight: 183 lb (83 kg) Cardiovascular Comment: reports checking weight about every other day - no sx HF and reviewed Action Plan, BP checkig 138/73 today  Respiratory Respiratory Symptoms Reported: No symptoms reported Respiratory Management Strategies: Routine screening  Endocrine Endocrine Symptoms Reported: Not assessed Is patient diabetic?: No    Gastrointestinal Additional Gastrointestinal Details: contiues to limit salt and eat soft diet due to 03/22/24 getting new dentures Gastrointestinal Management Strategies: Medication therapy    Genitourinary Genitourinary Symptoms Reported: No symptoms reported    Integumentary Integumentary Symptoms Reported: No symptoms reported Additional Integumentary Details: Rheum told patient could see dermatology is she would like - considering Skin Management Strategies: Routine screening  Musculoskeletal Musculoskelatal Symptoms Reviewed: Back pain, Joint pain, Muscle pain Additional Musculoskeletal Details: denies unsteady or weak, cane/walker prn no falls Musculoskeletal Management Strategies: Medication therapy Musculoskeletal Comment: states in process of getting new HHA for assist - paperwork all sent, referral to Pain Management, appt 04/12/24 Falls in the  past year?: No Number of falls in past year: 1 or less Was there an injury with Fall?: No Fall Risk Category  Calculator: 0 Patient Fall Risk Level: Low Fall Risk Patient at Risk for Falls Due to: Orthopedic patient, Impaired mobility Fall risk Follow up: Falls evaluation completed  Psychosocial Psychosocial Symptoms Reported: No symptoms reported Behavioral Management Strategies: Medication therapy Behavioral Health Comment: continues with LCSW and psychiatrist        03/08/2024    PHQ2-9 Depression Screening   Little interest or pleasure in doing things Not at all  Feeling down, depressed, or hopeless Not at all  PHQ-2 - Total Score 0  Trouble falling or staying asleep, or sleeping too much    Feeling tired or having little energy    Poor appetite or overeating     Feeling bad about yourself - or that you are a failure or have let yourself or your family down    Trouble concentrating on things, such as reading the newspaper or watching television    Moving or speaking so slowly that other people could have noticed.  Or the opposite - being so fidgety or restless that you have been moving around a lot more than usual    Thoughts that you would be better off dead, or hurting yourself in some way    PHQ2-9 Total Score    If you checked off any problems, how difficult have these problems made it for you to do your work, take care of things at home, or get along with other people    Depression Interventions/Treatment      Today's Vitals   03/08/24 0908  Weight: 183 lb (83 kg)      Medications Reviewed Today     Reviewed by Devra Lands, RN (Registered Nurse) on 03/08/24 at [phone]  Med List Status: <None>   Medication Order Taking? Sig Documenting Provider Last Dose Status Informant  acetaminophen  (TYLENOL ) 500 MG tablet [id number] Yes Take 1 tablet (500 mg total) by mouth daily. With 2nd dose as needed for pain  Patient taking differently: Take 500 mg by mouth daily. Reports takig as needed daily   Rilla Baller, MD  Active   ALPRAZolam  (XANAX ) 0.5 MG tablet [id number] Yes Take 1  tablet (0.5 mg total) by mouth daily. Okey Barnie SAUNDERS, MD  Active   amoxicillin  (AMOXIL ) 500 MG tablet [id number] Yes Take 4 tablets 1 hour prior to dental work Pietro Redell RAMAN, MD  Active   antiseptic oral rinse KEENAN) LIQD [id number] Yes 15 mLs by Mouth Rinse route 2 (two) times daily as needed for dry mouth. [provider]  Active Self  apixaban  (ELIQUIS ) 5 MG TABS tablet [id number] Yes TAKE 1 TABLET BY MOUTH TWICE  DAILY Crenshaw, Redell RAMAN, MD  Active   cholecalciferol  (VITAMIN D ) 1000 UNITS tablet [id number] Yes Take 1,000 Units by mouth daily. [provider]  Active Self  cyanocobalamin  (VITAMIN B12) 1000 MCG tablet [id number] Yes Take 1 tablet (1,000 mcg total) by mouth once a week. Rilla Baller, MD  Active Self           Med Note (CARD, AMY LITTIE   [place] Nov 29, 2022  9:38 PM) Emily on Friday.  diltiazem  (CARDIZEM  CD) 360 MG 24 hr capsule [id number] Yes TAKE 1 CAPSULE BY MOUTH DAILY Crenshaw, Redell RAMAN, MD  Active   docusate sodium  (COLACE) 100 MG capsule [id number] Yes Take 100 mg by mouth daily. [provider]  Active Self  dofetilide  (  TIKOSYN ) 250 MCG capsule [id number] Yes TAKE 1 CAPSULE BY MOUTH TWICE  DAILY Crenshaw, Redell RAMAN, MD  Active   Evolocumab  (REPATHA  SURECLICK) 140 MG/ML SOAJ [id number] Yes Inject 140 mg into the skin every 14 (fourteen) days. Pietro Redell RAMAN, MD  Active   ezetimibe  (ZETIA ) 10 MG tablet [id number] Yes Take 1 tablet (10 mg total) by mouth at bedtime. Rilla Baller, MD  Active   furosemide  (LASIX ) 20 MG tablet [id number] Yes Take 2 tablets (40 mg total) by mouth daily. Pietro Redell RAMAN, MD  Active   gabapentin  (NEURONTIN ) 300 MG capsule [id number] Yes Take 2 capsules (600 mg total) by mouth every morning AND 1 capsule (300 mg total) daily in the afternoon AND 2 capsules (600 mg total) at bedtime. Rilla Baller, MD  Active   lidocaine  (LIDODERM ) 5 % [id number]  Place 1 patch onto the skin daily. Remove & Discard patch within 12 hours or as  directed by MD Glendia, Rocky SAILOR, PA-C  Active   Lifitegrast  (XIIDRA ) 5 % SOLN [id number] Yes Place 1 drop into both eyes 2 (two) times daily.   Active   loratadine  (CLARITIN ) 10 MG tablet [id number] Yes Take 1 tablet (10 mg total) by mouth daily. Rilla Baller, MD  Active Self  losartan  (COZAAR ) 100 MG tablet [id number] Yes TAKE ONE-HALF TABLET BY MOUTH  DAILY Rilla Baller, MD  Active   magnesium  oxide (MAG-OX) 400 (240 Mg) MG tablet [id number] Yes Take 1 tablet (400 mg total) by mouth daily. Rilla Baller, MD  Active   methylPREDNISolone  (MEDROL  DOSEPAK) 4 MG TBPK tablet [id number]  Take as prescribed on the box  Patient not taking: Reported on 01/30/2024   Georgina Ozell LABOR, MD  Active   metoprolol  tartrate (LOPRESSOR ) 50 MG tablet [id number] Yes TAKE 1 AND 1/2 TABLETS BY MOUTH  TWICE DAILY Pietro Redell RAMAN, MD  Active   pantoprazole  (PROTONIX ) 40 MG tablet [id number] Yes Take 1 tablet (40 mg total) by mouth daily. Rilla Baller, MD  Active   potassium chloride  (KLOR-CON  M) 10 MEQ tablet [id number] Yes TAKE 1 TABLET BY MOUTH TWICE  DAILY Rilla Baller, MD  Active   thiamine  (VITAMIN B1) 100 MG tablet [id number] Yes Take 1 tablet (100 mg total) by mouth once a week. Rilla Baller, MD  Active Self           Med Note (CARD, AMY LITTIE   [place] Nov 29, 2022  9:41 PM) Emily on Mondays.  zolpidem  (AMBIEN ) 10 MG tablet [id number] Yes Take 1 tablet (10 mg total) by mouth at bedtime as needed for sleep. Okey Barnie SAUNDERS, MD  Active             Recommendation:   PCP Follow-up Specialty provider follow-up Pain Management 04/12/24 Continue Current Plan of Care  Follow Up Plan:   Telephone follow-up 6 Neely Cecena as discussed  Nestora Duos, MSN, RN [facility]  [facility], [facility] RN Care Manager Direct Dial: [phone] Fax: [phone]

## 2024-03-09 ENCOUNTER — Other Ambulatory Visit: Payer: Self-pay

## 2024-03-09 ENCOUNTER — Other Ambulatory Visit (HOSPITAL_COMMUNITY): Payer: Self-pay

## 2024-03-10 ENCOUNTER — Other Ambulatory Visit: Payer: Self-pay

## 2024-03-12 ENCOUNTER — Other Ambulatory Visit: Payer: Self-pay | Admitting: Licensed Clinical Social Worker

## 2024-03-12 ENCOUNTER — Other Ambulatory Visit: Payer: Self-pay | Admitting: Family Medicine

## 2024-03-12 DIAGNOSIS — K219 Gastro-esophageal reflux disease without esophagitis: Secondary | ICD-10-CM

## 2024-03-12 NOTE — Patient Outreach (Signed)
 Complex Care Management   Visit Note  03/12/2024  Name:  Alexandria Leach MRN: [id number] DOB: 09-25-1950  Situation: Referral received for Complex Care Management related to Mental/Behavioral Health diagnosis Anxiety. I obtained verbal consent from Patient.  Visit completed with Patient  on the phone  Background:   Past Medical History:  Diagnosis Date   Allergy 1999   SULFUR,STATIN,CYMBALTA   Ankle fracture, left 02/19/2015   from a fall   Anxiety    Cataract 2014   corrected with surgery   CHF (congestive heart failure) (HCC)    CKD (chronic kidney disease) stage 3, GFR 30-59 ml/min Alexandria Leach)    saw nephrologist Alexandria Juline   Community acquired pneumonia 11/29/2022   Multifocal s/p hospitalization 11/2022     COVID-19 09/21/2021   DDD (degenerative disc disease), cervical    Depression    Fibromyalgia    GERD (gastroesophageal reflux disease)    Glaucoma    s/p surgery, sees ophtho Q6 mo   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 Thought benign.    Osteoarthritis    shoulders and knees, not RA per Alexandria Alexandria Leach   Osteopenia 09/18/2015   DEXA T -1.1 hip, -0.2 spine 08/2015    Personal history of urinary calculi latest 2014   Pneumonia 12/02/2011   PONV (postoperative nausea and vomiting)    Refusal of blood transfusions as patient is Jehovah's Witness    Rheumatic heart disease 1980   s/p mitral valve repair 1980   Sjogren's syndrome    Trimalleolar fracture of left ankle 02/23/2015    Assessment: LCSW spoke to patient on the phone. Patient reports she is doing well. Patient has not heard from  Alexandria Leach about reinstated her PCA services. Patient states she has an appointment with the pain clinic on 04/12/24 and is hopeful to find some relief with her pain. Patient stated she had no other needs at this time.  Patient Reported Symptoms:  Cognitive Cognitive Status: Alert and oriented to person, place, and time, Normal speech and language skills      Neurological Neurological Review of Symptoms: No symptoms reported    HEENT HEENT Symptoms Reported: No symptoms reported      Cardiovascular Cardiovascular Symptoms Reported: No symptoms reported    Respiratory Respiratory Symptoms Reported: No symptoms reported    Endocrine Endocrine Symptoms Reported: No symptoms reported    Gastrointestinal Gastrointestinal Symptoms Reported: No symptoms reported      Genitourinary Genitourinary Symptoms Reported: No symptoms reported    Integumentary Integumentary Symptoms Reported: No symptoms reported    Musculoskeletal Musculoskelatal Symptoms Reviewed: Back pain, Joint pain, Muscle pain Additional Musculoskeletal Details: Alexandria Leach reports pain- appointment with pain clinic on 04/12/24 Musculoskeletal Management Strategies: Coping strategies, Medication therapy Musculoskeletal Self-Management Outcome: 3 (uncertain)      Psychosocial Psychosocial Symptoms Reported: No symptoms reported          03/12/2024    PHQ2-9 Depression Screening   Little interest or pleasure in doing things    Feeling down, depressed, or hopeless    PHQ-2 - Total Score  Trouble falling or staying asleep, or sleeping too much    Feeling tired or having little energy    Poor appetite or overeating     Feeling bad about yourself - or that you are a failure or have let yourself or your family down    Trouble concentrating on things, such as reading the newspaper or watching television    Moving or speaking so slowly that other people could have noticed.  Or the opposite - being so fidgety or restless that you have  been moving around a lot more than usual    Thoughts that you would be better off dead, or hurting yourself in some way    PHQ2-9 Total Score    If you checked off any problems, how difficult have these problems made it for you to do your work, take care of things at home, or get along with other people    Depression Interventions/Treatment      There were no vitals filed for this visit.    Medications Reviewed Today     Reviewed by Alexandria Bolt, LCSW (Social Worker) on 03/12/24 at 1341  Med List Status: <None>   Medication Order Taking? Sig Documenting Provider Last Dose Status Informant  acetaminophen  (TYLENOL ) 500 MG tablet [id number]  Take 1 tablet (500 mg total) by mouth daily. With 2nd dose as needed for pain  Patient taking differently: Take 500 mg by mouth daily. Reports takig as needed daily   Alexandria Baller, MD  Active   ALPRAZolam  (XANAX ) 0.5 MG tablet [id number]  Take 1 tablet (0.5 mg total) by mouth daily. Okey Barnie SAUNDERS, MD  Active   amoxicillin  (AMOXIL ) 500 MG tablet [id number]  Take 4 tablets 1 hour prior to dental work Pietro Redell RAMAN, MD  Active   antiseptic oral rinse KEENAN) LIQD [id number]  15 mLs by Mouth Rinse route 2 (two) times daily as needed for dry mouth. [provider]  Active Self  apixaban  (ELIQUIS ) 5 MG TABS tablet [id number]  TAKE 1 TABLET BY MOUTH TWICE  DAILY Crenshaw, Redell RAMAN, MD  Active   cholecalciferol  (VITAMIN D ) 1000 UNITS tablet [id number]  Take 1,000 Units by mouth daily. [provider]  Active Self  cyanocobalamin  (VITAMIN B12) 1000 MCG tablet [id number]  Take 1 tablet (1,000 mcg total) by mouth once a week. Alexandria Baller, MD  Active Self           Med Note (CARD, AMY LITTIE   K[place] Nov 29, 2022  9:38 PM) Emily on Friday.  diltiazem  (CARDIZEM  CD) 360 MG 24 hr capsule [id number]  TAKE 1 CAPSULE BY MOUTH DAILY Crenshaw, Redell RAMAN, MD  Active   docusate sodium  (COLACE) 100 MG capsule [id number]  Take 100 mg by mouth daily. [provider]  Active Self  dofetilide  (TIKOSYN ) 250 MCG capsule [id number]  TAKE 1 CAPSULE BY MOUTH TWICE  DAILY Crenshaw, Redell RAMAN, MD  Active   Evolocumab  (REPATHA  SURECLICK) 140 MG/ML SOAJ [id number]  Inject 140 mg into the skin every 14 (fourteen) days. Pietro Redell RAMAN, MD  Active   ezetimibe  (ZETIA ) 10 MG tablet [id number]  Take 1 tablet (10 mg total) by mouth at bedtime. Alexandria Baller, MD  Active   furosemide  (LASIX ) 20 MG tablet [id number]  Take 2 tablets (40 mg total) by mouth daily. Pietro Redell RAMAN, MD  Active   gabapentin  (NEURONTIN ) 300 MG capsule [id number]  Take 2 capsules (600 mg total) by mouth every morning AND 1 capsule (300 mg  total) daily in the afternoon AND 2 capsules (600 mg total) at bedtime. Alexandria Baller, MD  Active   lidocaine  (LIDODERM ) 5 % [id number]  Place 1 patch onto the skin daily. Remove & Discard patch within 12 hours or as directed by MD Glendia, Rocky SAILOR, PA-C  Active   Lifitegrast  (XIIDRA ) 5 % SOLN [id number]  Place 1 drop into both eyes 2 (two) times daily.   Active   loratadine  (CLARITIN ) 10 MG tablet [id number]  Take 1 tablet (10 mg total) by mouth daily. Alexandria Baller, MD  Active Self  losartan  (COZAAR ) 100 MG tablet [id number]  TAKE ONE-HALF TABLET BY MOUTH  DAILY Alexandria Baller, MD  Active   magnesium  oxide (MAG-OX) 400 (240 Mg) MG tablet [id number]  Take 1 tablet (400 mg total) by mouth daily. Alexandria Baller, MD  Active   methylPREDNISolone  (MEDROL  DOSEPAK) 4 MG TBPK tablet [id number]  Take as prescribed on the box  Patient not taking: Reported on 01/30/2024   Georgina Ozell LABOR, MD  Active   metoprolol  tartrate (LOPRESSOR ) 50 MG tablet [id number]  TAKE 1 AND 1/2 TABLETS BY MOUTH  TWICE DAILY Pietro Redell RAMAN, MD  Active   pantoprazole  (PROTONIX ) 40 MG tablet [id number]  Take 1 tablet (40 mg total) by mouth daily. Alexandria Baller, MD  Active   potassium chloride  (KLOR-CON  M) 10 MEQ tablet [id number]  TAKE 1 TABLET BY MOUTH TWICE  DAILY Alexandria Baller, MD  Active   thiamine  (VITAMIN B1) 100 MG tablet [id number]  Take 1 tablet (100 mg total) by mouth once a week. Alexandria Baller, MD  Active Self           Med Note (CARD, AMY LITTIE   [place] Nov 29, 2022  9:41 PM) Emily on Mondays.  zolpidem  (AMBIEN ) 10 MG tablet [id number]  Take 1 tablet (10 mg total) by mouth at bedtime as needed for sleep. Okey Barnie SAUNDERS, MD  Expired 03/08/24 2359             Recommendation:   PCP Follow-up Continue Current Plan of Care  Follow Up Plan:   Telephone follow up appointment date/time:  03/26/24 at 11am.  Cena Ligas, LCSW Clinical Social Worker [facility]

## 2024-03-12 NOTE — Patient Instructions (Signed)
 Visit Information  Thank you for taking time to visit with me today. Please don't hesitate to contact me if I can be of assistance to you before our next scheduled appointment.  Our next appointment is by telephone on 03/26/24 at 11am. Please call the care guide team at [phone] if you need to cancel or reschedule your appointment.   Following is a copy of your care plan:   Goals Addressed             This Visit's Progress    [facility] Social Work Care Plan LCSW       Problems:   Mental Health Concerns :Anxiety  CSW Clinical Goal(s):   Over the next 90 days the Patient will demonstrate a reduction in symptoms related to Anxiety with Social Anxiety, as evidence by reduction in anxiety symptoms and medical adherence.   Interventions:  Mental Health:  Evaluation of current treatment plan related to Anxiety with Social Anxiety, Active listening / Reflection utilized Depression screen reviewed Emotional Support Provided PHQ2/PHQ9 completed Discussed previous home health aid agency Encouraged to find new home health agency Discussed coping skills in social situations Discussed enrolling in therapy services- patient declined Reviewed mental health medications and discussed importance of compliance: xanax  Discussed following up with [facility] to begin services Discussed son coming in to town and lowering her bed to assist  Update PCA form  Patient Goals/Self-Care Activities:  Continue taking your medication as prescribed.   Continue with psychiatry provider. Find new home health aid  Plan:   Telephone follow up appointment with care management team member scheduled for:  03/26/24 at 11am..         Please call 911 if you are experiencing a Mental Health or Behavioral Health Crisis or need someone to talk to.  Patient verbalized understanding of Care plan and visit instructions communicated this visit  Cena Ligas, LCSW Clinical Social Worker [facility]

## 2024-03-16 ENCOUNTER — Other Ambulatory Visit (HOSPITAL_COMMUNITY): Payer: Self-pay

## 2024-03-17 ENCOUNTER — Other Ambulatory Visit (HOSPITAL_COMMUNITY): Payer: Self-pay

## 2024-03-19 ENCOUNTER — Other Ambulatory Visit (HOSPITAL_COMMUNITY): Payer: Self-pay

## 2024-03-26 ENCOUNTER — Other Ambulatory Visit: Payer: Self-pay | Admitting: Licensed Clinical Social Worker

## 2024-03-26 NOTE — Patient Outreach (Signed)
 Complex Care Management   Visit Note  03/26/2024  Name:  Alexandria Leach MRN: [id number] DOB: June 08, 1950  Situation: Referral received for Complex Care Management related to Mental/Behavioral Health diagnosis anxiety. I obtained verbal consent from Patient.  Visit completed with Patient  on the phone.  Background:   Past Medical History:  Diagnosis Date   Allergy 1999   SULFUR,STATIN,CYMBALTA   Ankle fracture, left 02/19/2015   from a fall   Anxiety    Cataract 2014   corrected with surgery   CHF (congestive heart failure) (HCC)    CKD (chronic kidney disease) stage 3, GFR 30-59 ml/min [facility])    saw nephrologist Dr Juline   Community acquired pneumonia 11/29/2022   Multifocal s/p hospitalization 11/2022     COVID-19 09/21/2021   DDD (degenerative disc disease), cervical    Depression    Fibromyalgia    GERD (gastroesophageal reflux disease)    Glaucoma    s/p surgery, sees ophtho Q6 mo   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 Thought benign.    Osteoarthritis    shoulders and knees, not RA per Dr Dolphus, positive ANA, positive Ro   Osteopenia 09/18/2015   DEXA T -1.1 hip, -0.2 spine 08/2015    Personal history of urinary calculi latest 2014   Pneumonia 12/02/2011   PONV (postoperative nausea and vomiting)    Refusal of blood transfusions as patient is Jehovah's Witness    Rheumatic heart disease 1980   s/p mitral valve repair 1980   Sjogren's syndrome    Trimalleolar fracture of left ankle 02/23/2015    Assessment: LCSW spoke with patient on the phone. Patient reports that she has received her u card and is  waiting on her new insurance card. Patient recently went to the dentist and has 2 other appointments next week. Patient she still has not heard from [facility] about her PCA services. Patient reports things are going well and she has no other concerns at this time.   Patient Reported Symptoms:  Cognitive Cognitive Status: Alert and oriented to person, place, and time, Normal speech and language skills      Neurological Neurological Review of Symptoms: Not assessed    HEENT HEENT Symptoms Reported: Not assessed      Cardiovascular Cardiovascular Symptoms Reported: Not assessed    Respiratory Respiratory Symptoms Reported: Not assesed    Endocrine Endocrine Symptoms Reported: Not assessed    Gastrointestinal Gastrointestinal Symptoms Reported: Not assessed      Genitourinary Genitourinary Symptoms Reported: Not assessed    Integumentary Integumentary Symptoms Reported: Not assessed    Musculoskeletal Musculoskelatal Symptoms Reviewed: Not assessed        Psychosocial Psychosocial Symptoms Reported: No symptoms reported          03/26/2024    PHQ2-9 Depression Screening   Little interest or pleasure in doing things Not at all  Feeling down, depressed, or hopeless Not at all  PHQ-2 - Total Score 0  Trouble falling or staying asleep, or sleeping too much    Feeling tired or having little energy  Poor appetite or overeating     Feeling bad about yourself - or that you are a failure or have let yourself or your family down    Trouble concentrating on things, such as reading the newspaper or watching television    Moving or speaking so slowly that other people could have noticed.  Or the opposite - being so fidgety or restless that you have been moving around a lot more than usual    Thoughts that you would be better off dead, or hurting yourself in some way    PHQ2-9 Total Score    If you checked off any problems, how difficult have these problems made it for you to do your  work, take care of things at home, or get along with other people    Depression Interventions/Treatment      There were no vitals filed for this visit.    Medications Reviewed Today     Reviewed by Veva Bolt, LCSW (Social Worker) on 03/26/24 at 1120  Med List Status: <None>   Medication Order Taking? Sig Documenting Provider Last Dose Status Informant  acetaminophen  (TYLENOL ) 500 MG tablet [id number]  Take 1 tablet (500 mg total) by mouth daily. With 2nd dose as needed for pain  Patient taking differently: Take 500 mg by mouth daily. Reports takig as needed daily   Rilla Baller, MD  Active   ALPRAZolam  (XANAX ) 0.5 MG tablet [id number]  Take 1 tablet (0.5 mg total) by mouth daily. Okey Barnie SAUNDERS, MD  Active   amoxicillin  (AMOXIL ) 500 MG tablet [id number]  Take 4 tablets 1 hour prior to dental work Pietro Redell RAMAN, MD  Active   antiseptic oral rinse KEENAN) LIQD [id number]  15 mLs by Mouth Rinse route 2 (two) times daily as needed for dry mouth. [provider]  Active Self  apixaban  (ELIQUIS ) 5 MG TABS tablet [id number]  TAKE 1 TABLET BY MOUTH TWICE  DAILY Crenshaw, Redell RAMAN, MD  Active   cholecalciferol  (VITAMIN D ) 1000 UNITS tablet [id number]  Take 1,000 Units by mouth daily. [provider]  Active Self  cyanocobalamin  (VITAMIN B12) 1000 MCG tablet [id number]  Take 1 tablet (1,000 mcg total) by mouth once a week. Rilla Baller, MD  Active Self           Med Note (CARD, AMY LITTIE   [place] Nov 29, 2022  9:38 PM) Emily on Friday.  diltiazem  (CARDIZEM  CD) 360 MG 24 hr capsule [id number]  TAKE 1 CAPSULE BY MOUTH DAILY Crenshaw, Redell RAMAN, MD  Active   docusate sodium  (COLACE) 100 MG capsule [id number]  Take 100 mg by mouth daily. [provider]  Active Self  dofetilide  (TIKOSYN ) 250 MCG capsule [id number]  TAKE 1 CAPSULE BY MOUTH TWICE  DAILY Crenshaw, Redell RAMAN, MD  Active   Evolocumab  (REPATHA  SURECLICK) 140 MG/ML SOAJ [id number]  Inject 140 mg into the skin every 14  (fourteen) days. Pietro Redell RAMAN, MD  Active   ezetimibe  (ZETIA ) 10 MG tablet [id number]  Take 1 tablet (10 mg total) by mouth at bedtime. Rilla Baller, MD  Active   furosemide  (LASIX ) 20 MG tablet [id number]  Take 2 tablets (40 mg total) by mouth daily. Pietro Redell RAMAN, MD  Active   gabapentin  (NEURONTIN ) 300 MG capsule [id number]  Take 2 capsules (600 mg total) by mouth every morning AND 1 capsule (300 mg total) daily in the afternoon AND 2 capsules (600 mg total) at bedtime. Rilla Baller, MD  Active   lidocaine  (  LIDODERM ) 5 % [id number]  Place 1 patch onto the skin daily. Remove & Discard patch within 12 hours or as directed by MD Glendia, Rocky SAILOR, PA-C  Active   Lifitegrast  (XIIDRA ) 5 % SOLN [id number]  Place 1 drop into both eyes 2 (two) times daily.   Active   loratadine  (CLARITIN ) 10 MG tablet [id number]  Take 1 tablet (10 mg total) by mouth daily. Rilla Baller, MD  Active Self  losartan  (COZAAR ) 100 MG tablet [id number]  TAKE ONE-HALF TABLET BY MOUTH  DAILY Rilla Baller, MD  Active   magnesium  oxide (MAG-OX) 400 (240 Mg) MG tablet [id number]  Take 1 tablet (400 mg total) by mouth daily. Rilla Baller, MD  Active   methylPREDNISolone  (MEDROL  DOSEPAK) 4 MG TBPK tablet [id number]  Take as prescribed on the box  Patient not taking: Reported on 01/30/2024   Georgina Ozell LABOR, MD  Active   metoprolol  tartrate (LOPRESSOR ) 50 MG tablet [id number]  TAKE 1 AND 1/2 TABLETS BY MOUTH  TWICE DAILY Pietro Redell RAMAN, MD  Active   pantoprazole  (PROTONIX ) 40 MG tablet [id number]  TAKE 1 TABLET BY MOUTH DAILY Rilla Baller, MD  Active   potassium chloride  (KLOR-CON  M) 10 MEQ tablet [id number]  TAKE 1 TABLET BY MOUTH TWICE  DAILY Rilla Baller, MD  Active   thiamine  (VITAMIN B1) 100 MG tablet [id number]  Take 1 tablet (100 mg total) by mouth once a week. Rilla Baller, MD  Active Self           Med Note (CARD, AMY LITTIE   [place] Nov 29, 2022  9:41 PM) Emily on Mondays.  zolpidem  (AMBIEN ) 10 MG  tablet [id number]  Take 1 tablet (10 mg total) by mouth at bedtime as needed for sleep. Okey Barnie SAUNDERS, MD  Expired 03/08/24 2359             Recommendation:   PCP Follow-up Continue Current Plan of Care  Follow Up Plan:   Telephone follow up appointment date/time:  04/16/24.  Cena Ligas, LCSW Clinical Social Worker [facility]

## 2024-03-26 NOTE — Patient Instructions (Signed)
 Visit Information  Thank you for taking time to visit with me today. Please don't hesitate to contact me if I can be of assistance to you before our next scheduled appointment.  Our next appointment is by telephone on 01/14/25. Please call the care guide team at [phone] if you need to cancel or reschedule your appointment.   Following is a copy of your care plan:   Goals Addressed             This Visit's Progress    [facility] Social Work Care Plan LCSW   On track    Problems:   Mental Health Concerns :Anxiety  CSW Clinical Goal(s):   Over the next 90 days the Patient will demonstrate a reduction in symptoms related to Anxiety with Social Anxiety, as evidence by reduction in anxiety symptoms and medical adherence.   Interventions:  Mental Health:  Evaluation of current treatment plan related to Anxiety with Social Anxiety, Active listening / Reflection utilized Depression screen reviewed Emotional Support Provided PHQ2/PHQ9 completed Discussed previous home health aid agency Encouraged to find new home health agency Discussed coping skills in social situations Discussed enrolling in therapy services- patient declined Reviewed mental health medications and discussed importance of compliance: xanax  Discussed [facility] with [facility] to begin services Discussed son coming in to town and lowering her bed to assist  Update PCA form  Patient Goals/Self-Care Activities:  Continue taking your medication as prescribed.   Continue with psychiatry provider. Find new home health aid  Plan:   Telephone follow up appointment with care management team member scheduled for:  04/16/24 at 11am..         Please call 911 if you are experiencing a Mental Health or Behavioral Health Crisis or need someone to talk to.  Patient verbalized understanding of Care plan and visit instructions communicated this visit  Cena Ligas, LCSW Clinical Social Worker [facility]

## 2024-03-31 NOTE — Progress Notes (Signed)
 "     Alexandria Console, PA-C [address] [place], [place]  [address] Phone: [phone]   Gastroenterology Consultation  Referring Provider:     Rilla Baller, MD Primary Care Physician:  Alexandria Baller, MD Primary Gastroenterologist:  Alexandria Console, PA-C / Alexandria Naval, MD  Reason for Consultation:     Hx Colon Polyps; Repeat Colonoscopy        HPI:   Discussed the use of AI scribe software for clinical note transcription with the patient, who gave verbal consent to proceed.  Alexandria Leach is a 74 year old female with colonic polyps and constipation who presents for follow-up and preparation for repeat colonoscopy.  08/2019 last colonoscopy by Dr. Elicia at [facility]: 3 polyps (3 mm to 5 mm) removed.  Large internal hemorrhoids.  Prep was fair.  3-year repeat (was due 08/2022).  08/2019 EGD: Mild gastritis, otherwise normal.  Biopsies negative for H. pylori.  07/2014 Colonoscopy by Dr. Dyane at [facility]: Normal.  No polyps.  Good prep.  PMH: On Eliquis , TIA, mild aortic stenosis, atrial flutter, HTN, chronic GERD, CKD, obesity.  Cardiologist Dr. Pietro.  07/2023 echo normal LVEF 60 to 65%.  History of Present Illness Bowel Habits and Constipation: - Chronic constipation, currently without symptoms - Daily use of stool softener and a bowel 'relaxer' for regulation - No prior use of Miralax  - No current gastrointestinal symptoms  Upper Gastrointestinal Findings: - Upper endoscopy in 2021 showed mild gastritis with negative H. pylori biopsies and otherwise normal findings - No repeat upper endoscopy performed - No recurrence of upper gastrointestinal symptoms since 2021  Anticoagulant Use: - Current use of Eliquis  managed by cardiologist - Doing well with no problems or concerns     Past Medical History:  Diagnosis Date   Allergy 1999   SULFUR,STATIN,CYMBALTA   Ankle fracture, left 02/19/2015   from a fall   Anxiety 2008   Cataract 79987    corrected with surgery   CHF (congestive heart failure) (HCC) 2012   CKD (chronic kidney disease) stage 3, GFR 30-59 ml/min [facility])    saw nephrologist Dr Alexandria Leach   Community acquired pneumonia 11/29/2022   Multifocal s/p hospitalization 11/2022     COVID-19 09/21/2021   DDD (degenerative disc disease), cervical    Depression 2003   Fibromyalgia    GERD (gastroesophageal reflux disease)    Glaucoma 2012   s/p surgery, sees ophtho Q6 mo   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 Thought benign.    Osteoarthritis    shoulders and knees, not RA per Dr Alexandria Leach, positive ANA, positive Ro   Osteopenia 09/18/2015   DEXA T -1.1 hip, -0.2 spine 08/2015    Personal history of urinary calculi latest 2014   Pneumonia 12/02/2011   PONV (postoperative nausea and vomiting)    Refusal of blood transfusions as patient is Jehovah's Witness    Rheumatic heart disease 1980   s/p mitral valve repair 1980   Sjogren's syndrome    Trimalleolar fracture of left ankle 02/23/2015    Past Surgical History:  Procedure Laterality Date   BREAST BIOPSY Right 2006   benign   CARDIOVERSION N/A 07/18/2020   Procedure: CARDIOVERSION;  Surgeon: Alexandria Soyla LABOR, MD;  Location: [facility] ENDOSCOPY;  Service: Cardiovascular;  Laterality: N/A;   CHOLECYSTECTOMY  11/27/2011   Procedure: LAPAROSCOPIC CHOLECYSTECTOMY WITH INTRAOPERATIVE CHOLANGIOGRAM;  Surgeon: Alexandria Alexandria Pacini, MD;  Location: [facility] OR;  Service: General;  Laterality: N/A;  laparoscopic cholecystectomy with choleangiogram umbilical hernia repair   COLONOSCOPY  07/2014   WNL Alexandria Leach)   COLONOSCOPY  08/2019   fair  prep, 3 TAs, rpt 3 yrs (Alexandria Leach)   dexa  08/2012   normal per patient - no records available   ESOPHAGOGASTRODUODENOSCOPY  08/2019   reactive gastropathy, neg H pylori (Alexandria Leach)   EYE SURGERY Bilateral 2012   cataract removal   FRACTURE SURGERY  2016   HERNIA REPAIR  2012   MITRAL VALVE REPAIR  1980   open heart   ORIF ANKLE FRACTURE Left 02/26/2015   Procedure: OPEN REDUCTION INTERNAL FIXATION (ORIF) LEFT TRIMALLEOLAR ANKLE FRACTURE;  Surgeon: Alexandria Alexandria Cummins, MD;  Location: [facility] OR;  Service: Orthopedics;  Laterality: Left;   TUBAL LIGATION  1980   UMBILICAL HERNIA REPAIR  11/27/2011   Procedure: HERNIA REPAIR UMBILICAL ADULT;  Surgeon: Alexandria Alexandria Pacini, MD;  Location: [facility] OR;  Service: General;  Laterality: N/A;   VAGINAL HYSTERECTOMY  1992   for fibroids -- partial, ovaries remain    Prior to Admission medications  Medication Sig Start Date End Date Taking? Authorizing Provider  acetaminophen  (TYLENOL ) 500 MG tablet Take 1 tablet (500 mg total) by mouth daily. With 2nd dose as needed for pain Patient taking differently: Take 500 mg by mouth daily. Reports takig as needed daily 10/22/23   Alexandria Baller, MD  ALPRAZolam  (XANAX ) 0.5 MG tablet Take 1 tablet (0.5 mg total) by mouth daily. 12/02/23   Okey Barnie SAUNDERS, MD  amoxicillin  (AMOXIL ) 500 MG tablet Take 4 tablets 1 hour prior to dental work 07/08/23   Alexandria Redell RAMAN, MD  antiseptic oral rinse (BIOTENE) LIQD 15 mLs by Mouth Rinse route 2 (two) times daily as needed for dry mouth.    [provider]  apixaban  (ELIQUIS ) 5 MG TABS tablet TAKE 1 TABLET BY MOUTH TWICE  DAILY 02/16/24   Alexandria Redell RAMAN, MD  cholecalciferol  (VITAMIN D ) 1000 UNITS tablet Take 1,000 Units by mouth daily.    [provider]  cyanocobalamin  (VITAMIN B12) 1000 MCG tablet Take 1 tablet (1,000 mcg total) by mouth once a week. 01/11/22   Alexandria Baller, MD  diltiazem  (CARDIZEM  CD) 360 MG 24 hr capsule TAKE 1 CAPSULE BY MOUTH DAILY 10/16/23    Alexandria Redell RAMAN, MD  docusate sodium  (COLACE) 100 MG capsule Take 100 mg by mouth daily.    [provider]  dofetilide  (TIKOSYN ) 250 MCG capsule TAKE 1 CAPSULE BY MOUTH TWICE  DAILY 10/27/23   Alexandria Redell RAMAN, MD  Evolocumab  (REPATHA  SURECLICK) 140 MG/ML SOAJ Inject 140 mg into the skin every 14 (fourteen) days. 09/09/23   Alexandria Redell RAMAN, MD  ezetimibe  (ZETIA ) 10 MG tablet Take 1 tablet (10 mg total) by mouth at bedtime. 01/21/24   Alexandria Baller, MD  furosemide  (LASIX ) 20 MG tablet Take 2 tablets (40 mg total) by mouth daily. 11/14/23   Alexandria Redell RAMAN, MD  gabapentin  (NEURONTIN ) 300 MG capsule Take 2 capsules (600 mg total) by mouth every morning AND 1 capsule (300 mg total) daily in the afternoon AND 2 capsules (600  mg total) at bedtime. 01/21/24   Alexandria Baller, MD  lidocaine  (LIDODERM ) 5 % Place 1 patch onto the skin daily. Remove & Discard patch within 12 hours or as directed by MD 12/21/23   Glendia Rocky SAILOR, PA-C  Lifitegrast  (XIIDRA ) 5 % SOLN Place 1 drop into both eyes 2 (two) times daily. 09/08/23     loratadine  (CLARITIN ) 10 MG tablet Take 1 tablet (10 mg total) by mouth daily. 02/19/22   Alexandria Baller, MD  losartan  (COZAAR ) 100 MG tablet TAKE ONE-HALF TABLET BY MOUTH  DAILY 02/16/24   Alexandria Baller, MD  magnesium  oxide (MAG-OX) 400 (240 Mg) MG tablet Take 1 tablet (400 mg total) by mouth daily. 01/20/23   Alexandria Baller, MD  methylPREDNISolone  (MEDROL  DOSEPAK) 4 MG TBPK tablet Take as prescribed on the box Patient not taking: Reported on 01/30/2024 01/21/24   Georgina Ozell LABOR, MD  metoprolol  tartrate (LOPRESSOR ) 50 MG tablet TAKE 1 AND 1/2 TABLETS BY MOUTH  TWICE DAILY 07/21/23   Alexandria Redell RAMAN, MD  pantoprazole  (PROTONIX ) 40 MG tablet TAKE 1 TABLET BY MOUTH DAILY 03/13/24   Alexandria Baller, MD  potassium chloride  (KLOR-CON  M) 10 MEQ tablet TAKE 1 TABLET BY MOUTH TWICE  DAILY 02/16/24   Alexandria Baller, MD  thiamine  (VITAMIN B1) 100 MG tablet Take 1 tablet  (100 mg total) by mouth once a week. 07/31/22   Alexandria Baller, MD  zolpidem  (AMBIEN ) 10 MG tablet Take 1 tablet (10 mg total) by mouth at bedtime as needed for sleep. 12/02/23 03/08/24  Okey Barnie SAUNDERS, MD    Family History  Problem Relation Age of Onset   Cancer Mother        lung (nonsmoker)   CAD Mother        MI in her 23s   ALS Mother    Kidney disease Father    Alcohol  abuse Father    Diabetes Father    Lupus Sister        and niece   Learning disabilities Sister    Diabetes Sister    Stroke Sister    Depression Sister    Lupus Sister    Anxiety disorder Sister    COPD Sister    Heart disease Sister    Hyperlipidemia Sister    Blindness Sister    Cancer Maternal Uncle        bone   Stroke Maternal Grandmother    Cancer Brother        bone   Diabetes Brother    Heart attack Brother    Arthritis Brother    Drug abuse Son    Kidney disease Son    Healthy Son    Healthy Son    Healthy Son    Kidney failure Other        on HD   Alcohol  abuse Sister    Hearing loss Sister    Anxiety disorder Sister    Vision loss Sister    Breast cancer Neg Hx      Social History[1]  Allergies as of 04/01/2024 - Review Complete 04/01/2024  Allergen Reaction Noted   Cymbalta [duloxetine hcl] Other (See Comments) 01/15/2013   Trazodone  Other (See Comments) 06/20/2023   Statins Nausea Only and Other (See Comments) 10/04/2011   Sulfa antibiotics Nausea And Vomiting 03/16/2011    Review of Systems:    All systems reviewed and negative except where noted in HPI.   Physical Exam:  BP (!) 150/90   Pulse 89  Ht 5' 1 (1.549 m)   Wt 185 lb 6 oz (84.1 kg)   BMI 35.03 kg/m  No LMP recorded. Patient has had a hysterectomy.  General:   Alert,  Well-developed, well-nourished, pleasant and cooperative in NAD Lungs:  Respirations even and unlabored.  Clear throughout to auscultation.   No wheezes, crackles, or rhonchi. No acute distress. Heart:  Regular rate and rhythm; no  murmurs, clicks, rubs, or gallops. Abdomen:  Normal bowel sounds.  No bruits.  Soft, and non-distended without masses, hepatosplenomegaly or hernias noted.  No Tenderness.  No guarding or rebound tenderness.    Neurologic:  Alert and oriented x3;  grossly normal neurologically. Psych:  Alert and cooperative. Normal mood and affect.   Imaging Studies: No results found.  Labs: CBC    Component Value Date/Time   WBC 4.8 02/11/2024 0905   RBC 4.48 02/11/2024 0905   HGB 12.1 02/11/2024 0905   HGB 12.5 01/27/2018 1152   HGB 12.6 12/14/2014 0000   HCT 38.0 02/11/2024 0905   HCT 38.0 01/27/2018 1152   PLT 254 02/11/2024 0905   PLT 282 01/27/2018 1152   MCV 84.8 02/11/2024 0905   MCV 85 01/27/2018 1152    CMP     Component Value Date/Time   NA 138 02/11/2024 0905   NA 143 11/19/2023 0941   NA 140 12/14/2014 0000   K 3.9 02/11/2024 0905   K 4.5 12/14/2014 0000   CL 110 02/11/2024 0905   CO2 22 02/11/2024 0905   GLUCOSE 155 (H) 02/11/2024 0905   BUN 12 02/11/2024 0905   BUN 13 11/19/2023 0941   CREATININE 1.39 (H) 02/11/2024 0905   CALCIUM 9.8 02/11/2024 0905   PROT 7.1 02/11/2024 0905   PROT 7.4 02/11/2024 0905   PROT 7.3 07/08/2023 1129   ALBUMIN 4.3 01/14/2024 0724   ALBUMIN 4.4 07/08/2023 1129   AST 23 02/11/2024 0905   AST 46 12/14/2014 0000   ALT 14 02/11/2024 0905   ALT 38 12/14/2014 0000   ALKPHOS 62 01/14/2024 0724   ALKPHOS 75 12/14/2014 0000   BILITOT 0.4 02/11/2024 0905   BILITOT 0.4 07/08/2023 1129   BILITOT 0.4 12/14/2014 0000   GFRNONAA 47 (L) 09/07/2023 1021   GFRNONAA 44 (L) 04/20/2019 1511   GFRAA 51 (L) 11/11/2019 1626   GFRAA 50 (L) 04/20/2019 1511    Assessment and Plan:   Alexandria Leach is a 74 y.o. y/o female has been referred for:  1.  History of adenomatous colon polyps Last colonoscopy 08/2019 showed 3 small adenomatous polyps removed and fair prep. - Scheduling Colonoscopy I discussed risks of colonoscopy with patient to  include risk of bleeding, colon perforation, and risk of sedation.  Patient expressed understanding and agrees to proceed with colonoscopy.  - 2-day prep instructions given.  2.  Chronic GERD - Continue pantoprazole  40 mg daily - GERD diet  3.  Comorbidities: On Eliquis , TIA, mild aortic stenosis, atrial flutter, HTN, chronic GERD, CKD, obesity.  Cardiologist Dr. Pietro.  07/2023 echo normal LVEF 60 to 65%. - Request cardiac permission to hold Eliquis  2 days prior to procedure  4.  Constipation Constipation well controlled on stool softener; additional measures needed for colonoscopy preparation. - Recommended start OTC polyethylene glycol (Miralax  or generic) one capful in a beverage daily. - Advised increased dietary fiber intake via fruits, vegetables, and whole grains. - Encouraged daily fluid intake of 64 ounces of water. - 2-day prep given for colonoscopy.   Follow up  as needed based on colonoscopy results.  Alexandria Console, PA-C       [1]  Social History Tobacco Use   Smoking status: Never    Passive exposure: Past   Smokeless tobacco: Never   Tobacco comments:    NEVER SMOKE  Vaping Use   Vaping status: Never Used  Substance Use Topics   Alcohol  use: No    Alcohol /week: 0.0 standard drinks of alcohol    Drug use: No   "

## 2024-04-01 ENCOUNTER — Other Ambulatory Visit (HOSPITAL_COMMUNITY): Payer: Self-pay

## 2024-04-01 ENCOUNTER — Ambulatory Visit: Admitting: Physician Assistant

## 2024-04-01 ENCOUNTER — Encounter: Payer: Self-pay | Admitting: Physician Assistant

## 2024-04-01 ENCOUNTER — Telehealth: Payer: Self-pay

## 2024-04-01 VITALS — BP 150/90 | HR 89 | Ht 61.0 in | Wt 185.4 lb

## 2024-04-01 DIAGNOSIS — K5900 Constipation, unspecified: Secondary | ICD-10-CM | POA: Diagnosis not present

## 2024-04-01 DIAGNOSIS — Z860101 Personal history of adenomatous and serrated colon polyps: Secondary | ICD-10-CM | POA: Diagnosis not present

## 2024-04-01 DIAGNOSIS — K5904 Chronic idiopathic constipation: Secondary | ICD-10-CM

## 2024-04-01 DIAGNOSIS — K219 Gastro-esophageal reflux disease without esophagitis: Secondary | ICD-10-CM

## 2024-04-01 DIAGNOSIS — Z860100 Personal history of colon polyps, unspecified: Secondary | ICD-10-CM

## 2024-04-01 MED ORDER — NA SULFATE-K SULFATE-MG SULF 17.5-3.13-1.6 GM/177ML PO SOLN
1.0000 | Freq: Once | ORAL | 0 refills | Status: AC
  Filled 2024-04-01: qty 354, 1d supply, fill #0

## 2024-04-01 NOTE — Telephone Encounter (Signed)
 [facility] Pre-operative Risk Assessment     Request for surgical clearance:     Endoscopy Procedure  What type of surgery is being performed?     Colonoscopy  When is this surgery scheduled?     04/30/24  What type of clearance is required ?   Pharmacy  Are there any medications that need to be held prior to surgery and how long? Eliquis  for 2 days  Practice name and name of physician performing surgery?      [facility]  What is your office phone and fax number?      Phone- [phone]  Fax- [phone]  Anesthesia type (None, local, MAC, general) ?       MAC   Please route your response to [employer], CMA

## 2024-04-01 NOTE — Patient Instructions (Addendum)
 You have been scheduled for a colonoscopy. Please follow written instructions given to you at your visit today.   If you use inhalers (even only as needed), please bring them with you on the day of your procedure.  DO NOT TAKE 7 DAYS PRIOR TO TEST- Trulicity (dulaglutide) Ozempic, Wegovy (semaglutide) Mounjaro, Zepbound (tirzepatide) Bydureon Bcise (exanatide extended release)  DO NOT TAKE 1 DAY PRIOR TO YOUR TEST Rybelsus (semaglutide) Adlyxin (lixisenatide) Victoza (liraglutide) Byetta (exanatide) ___________________________________________________________________________       For constipation: Start OTC Miralax  Powder Mix 1 capful in 6 to 8 ounces of a drink once daily  Recommend high-fiber diet, 30 g of fiber daily Eat fruits, vegetables, and whole grains Drink 64 ounces of water / fluids daily.     Thank you for trusting me with your gastrointestinal care!   [name], PA-C  _______________________________________________________  If your blood pressure at your visit was 140/90 or greater, please contact your primary care physician to follow up on this.  _______________________________________________________  If you are age 74 or older, your body mass index should be between 23-30. Your Body mass index is 35.03 kg/m. If this is out of the aforementioned range listed, please consider follow up with your Primary Care Provider.  If you are age 78 or younger, your body mass index should be between 19-25. Your Body mass index is 35.03 kg/m. If this is out of the aformentioned range listed, please consider follow up with your Primary Care Provider.   ________________________________________________________  The [facility] providers would like to encourage you to use MYCHART to communicate with providers for non-urgent requests or questions.  Due to long hold times on the telephone, sending your provider a message by [facility] may be a faster and more efficient way to  get a response.  Please allow 48 business hours for a response.  Please remember that this is for non-urgent requests.  _______________________________________________________  [facility] is using a team-based approach to care.  Your team is made up of your doctor and two to three APPS. Our APPS (Nurse Practitioners and Physician Assistants) work with your physician to ensure care continuity for you. They are fully qualified to address your health concerns and develop a treatment plan. They communicate directly with your gastroenterologist to care for you. Seeing the Advanced Practice Practitioners on your physician's team can help you by facilitating care more promptly, often allowing for earlier appointments, access to diagnostic testing, procedures, and other specialty referrals.

## 2024-04-02 NOTE — Progress Notes (Signed)
 Agree with assessment and plan as outlined.

## 2024-04-02 NOTE — Telephone Encounter (Signed)
"  ° °  Patient Name: Alexandria Leach  DOB: 1951/02/11 MRN: [id number]  Primary Cardiologist: Redell Shallow, MD  Chart reviewed as part of pre-operative protocol coverage. Patient has an upcoming colonoscopy scheduled for 04/30/2024, and we were asked to provide recommendations for holding Eliquis .   Per Pharmacy and office protocol, patient can hold Eliquis  for 2 days prior to procedure.   I will route this recommendation to the requesting party and remove from pre-op pool.  Please call with questions.  Sasuke Yaffe E Kathe Wirick, PA-C 04/02/2024, 11:56 AM  "

## 2024-04-02 NOTE — Telephone Encounter (Signed)
 Patient with diagnosis of Afib and several DVT on Eliquis  for anticoagulation.    Procedure: Colonoscopy Date of procedure: 04/30/2024   CHA2DS2-VASc Score = 4   This indicates a 4.8% annual risk of stroke. The patient's score is based upon: CHF History: 1 HTN History: 1 Diabetes History: 0 Stroke History: 0 Vascular Disease History: 0 Age Score: 1 Gender Score: 1     CrCl 36 mL/min Platelet count 254   Patient has not had an Afib/aflutter ablation in the last 3 months, DCCV within the last 4 weeks or a watchman implanted in the last 45 days     Per office protocol, patient can hold Eliquis  for 2 days prior to procedure.     **This guidance is not considered finalized until pre-operative APP has relayed final recommendations.**

## 2024-04-09 NOTE — Telephone Encounter (Signed)
"  Lmtcb.  "

## 2024-04-12 ENCOUNTER — Other Ambulatory Visit (HOSPITAL_COMMUNITY): Payer: Self-pay

## 2024-04-12 ENCOUNTER — Other Ambulatory Visit: Payer: Self-pay

## 2024-04-12 ENCOUNTER — Encounter: Admitting: Physical Medicine and Rehabilitation

## 2024-04-14 NOTE — Addendum Note (Signed)
 Addended by: WILLIEMAE JOLA PARAS on: 04/14/2024 04:13 PM   Modules accepted: Orders

## 2024-04-14 NOTE — Telephone Encounter (Signed)
 I spoke to Alexandria Leach and she advised that she had canceled the procedure because she didn't have a care partner and will need to rescheduled.  We rescheduled for 05/18/24.  I advised her that instructions are the same except for the dates and times but I was updating them on MyChart and a copy will be mailed.

## 2024-04-15 ENCOUNTER — Other Ambulatory Visit (HOSPITAL_COMMUNITY): Payer: Self-pay

## 2024-04-16 ENCOUNTER — Telehealth: Payer: Self-pay | Admitting: Licensed Clinical Social Worker

## 2024-04-16 ENCOUNTER — Encounter: Payer: Self-pay | Admitting: Licensed Clinical Social Worker

## 2024-04-16 NOTE — Patient Instructions (Signed)
 Romero Goldberg Troung - I am sorry I was unable to reach you today for our scheduled appointment. I work with Rilla Baller, MD and am calling to support your healthcare needs. Please contact me at [phone] at your earliest convenience. I look forward to speaking with you soon.   Thank you,  Cena Ligas, LCSW Clinical Social Worker [facility]

## 2024-04-19 ENCOUNTER — Telehealth: Payer: Self-pay

## 2024-04-28 ENCOUNTER — Telehealth: Payer: Self-pay

## 2024-04-29 ENCOUNTER — Telehealth: Payer: Self-pay

## 2024-04-30 ENCOUNTER — Encounter: Admitting: Gastroenterology

## 2024-05-18 ENCOUNTER — Encounter: Admitting: Gastroenterology

## 2024-06-10 ENCOUNTER — Ambulatory Visit: Admitting: Student

## 2024-06-21 ENCOUNTER — Telehealth (HOSPITAL_COMMUNITY): Admitting: Psychiatry

## 2024-06-29 ENCOUNTER — Ambulatory Visit: Admitting: Physician Assistant

## 2024-07-20 ENCOUNTER — Ambulatory Visit: Admitting: Family Medicine
